# Patient Record
Sex: Female | Born: 1940 | Race: White | Hispanic: No | Smoking: Former smoker
Health system: Southern US, Community
[De-identification: ages and names within clinical notes are randomized; demographics above are authoritative.]

## PROBLEM LIST (undated history)

## (undated) DIAGNOSIS — K56609 Unspecified intestinal obstruction, unspecified as to partial versus complete obstruction: Secondary | ICD-10-CM

## (undated) DIAGNOSIS — I10 Essential (primary) hypertension: Secondary | ICD-10-CM

## (undated) DIAGNOSIS — K219 Gastro-esophageal reflux disease without esophagitis: Secondary | ICD-10-CM

## (undated) DIAGNOSIS — F419 Anxiety disorder, unspecified: Secondary | ICD-10-CM

## (undated) DIAGNOSIS — R092 Respiratory arrest: Secondary | ICD-10-CM

## (undated) DIAGNOSIS — G4730 Sleep apnea, unspecified: Secondary | ICD-10-CM

## (undated) DIAGNOSIS — F0390 Unspecified dementia without behavioral disturbance: Secondary | ICD-10-CM

## (undated) DIAGNOSIS — M1990 Unspecified osteoarthritis, unspecified site: Secondary | ICD-10-CM

## (undated) DIAGNOSIS — F0280 Dementia in other diseases classified elsewhere without behavioral disturbance: Secondary | ICD-10-CM

## (undated) DIAGNOSIS — F329 Major depressive disorder, single episode, unspecified: Secondary | ICD-10-CM

## (undated) DIAGNOSIS — E785 Hyperlipidemia, unspecified: Secondary | ICD-10-CM

## (undated) DIAGNOSIS — I2510 Atherosclerotic heart disease of native coronary artery without angina pectoris: Secondary | ICD-10-CM

## (undated) DIAGNOSIS — J449 Chronic obstructive pulmonary disease, unspecified: Secondary | ICD-10-CM

## (undated) DIAGNOSIS — I255 Ischemic cardiomyopathy: Secondary | ICD-10-CM

## (undated) DIAGNOSIS — I472 Ventricular tachycardia: Secondary | ICD-10-CM

## (undated) DIAGNOSIS — Z888 Allergy status to other drugs, medicaments and biological substances status: Secondary | ICD-10-CM

## (undated) DIAGNOSIS — E663 Overweight: Secondary | ICD-10-CM

## (undated) DIAGNOSIS — E119 Type 2 diabetes mellitus without complications: Secondary | ICD-10-CM

## (undated) DIAGNOSIS — E559 Vitamin D deficiency, unspecified: Secondary | ICD-10-CM

## (undated) DIAGNOSIS — M797 Fibromyalgia: Secondary | ICD-10-CM

## (undated) DIAGNOSIS — E669 Obesity, unspecified: Secondary | ICD-10-CM

## (undated) DIAGNOSIS — R413 Other amnesia: Secondary | ICD-10-CM

## (undated) DIAGNOSIS — K5790 Diverticulosis of intestine, part unspecified, without perforation or abscess without bleeding: Secondary | ICD-10-CM

## (undated) DIAGNOSIS — I5032 Chronic diastolic (congestive) heart failure: Secondary | ICD-10-CM

## (undated) DIAGNOSIS — N200 Calculus of kidney: Secondary | ICD-10-CM

## (undated) DIAGNOSIS — R1310 Dysphagia, unspecified: Secondary | ICD-10-CM

## (undated) HISTORY — DX: Unspecified osteoarthritis, unspecified site: M19.90

## (undated) HISTORY — DX: Chronic obstructive pulmonary disease, unspecified: J44.9

## (undated) HISTORY — DX: Ventricular tachycardia: I47.2

## (undated) HISTORY — DX: Vitamin D deficiency, unspecified: E55.9

## (undated) HISTORY — DX: Other amnesia: R41.3

## (undated) HISTORY — PX: OTHER SURGICAL HISTORY: SHX169

## (undated) HISTORY — DX: Essential (primary) hypertension: I10

## (undated) HISTORY — DX: Unspecified intestinal obstruction, unspecified as to partial versus complete obstruction: K56.609

## (undated) HISTORY — DX: Dysphagia, unspecified: R13.10

## (undated) HISTORY — DX: Gastro-esophageal reflux disease without esophagitis: K21.9

## (undated) HISTORY — PX: LITHOTRIPSY: SUR834

## (undated) HISTORY — DX: Unspecified dementia without behavioral disturbance: F03.90

## (undated) HISTORY — DX: Anxiety disorder, unspecified: F41.9

## (undated) HISTORY — DX: Ischemic cardiomyopathy: I25.5

## (undated) HISTORY — DX: Atherosclerotic heart disease of native coronary artery without angina pectoris: I25.10

## (undated) HISTORY — DX: Diverticulosis of intestine, part unspecified, without perforation or abscess without bleeding: K57.90

## (undated) HISTORY — DX: Sleep apnea, unspecified: G47.30

## (undated) HISTORY — PX: CARDIAC SURGERY: SHX584

## (undated) HISTORY — DX: Calculus of kidney: N20.0

## (undated) HISTORY — DX: Hyperlipidemia, unspecified: E78.5

## (undated) HISTORY — DX: Fibromyalgia: M79.7

## (undated) HISTORY — DX: Overweight: E66.3

## (undated) HISTORY — DX: Type 2 diabetes mellitus without complications: E11.9

## (undated) HISTORY — DX: Obesity, unspecified: E66.9

## (undated) HISTORY — DX: Allergy status to other drugs, medicaments and biological substances status: Z88.8

## (undated) HISTORY — DX: Major depressive disorder, single episode, unspecified: F32.9

## (undated) HISTORY — DX: Chronic diastolic (congestive) heart failure: I50.32

---

## 1998-10-18 ENCOUNTER — Ambulatory Visit (HOSPITAL_COMMUNITY): Admission: RE | Admit: 1998-10-18 | Discharge: 1998-10-18 | Payer: Self-pay | Admitting: Internal Medicine

## 1999-02-11 ENCOUNTER — Other Ambulatory Visit: Admission: RE | Admit: 1999-02-11 | Discharge: 1999-02-11 | Payer: Self-pay | Admitting: *Deleted

## 1999-07-01 ENCOUNTER — Encounter: Payer: Self-pay | Admitting: Emergency Medicine

## 1999-07-01 ENCOUNTER — Emergency Department (HOSPITAL_COMMUNITY): Admission: EM | Admit: 1999-07-01 | Discharge: 1999-07-01 | Payer: Self-pay | Admitting: Emergency Medicine

## 1999-07-05 ENCOUNTER — Encounter (INDEPENDENT_AMBULATORY_CARE_PROVIDER_SITE_OTHER): Payer: Self-pay | Admitting: Specialist

## 1999-07-05 ENCOUNTER — Other Ambulatory Visit: Admission: RE | Admit: 1999-07-05 | Discharge: 1999-07-05 | Payer: Self-pay | Admitting: *Deleted

## 1999-09-14 ENCOUNTER — Encounter: Admission: RE | Admit: 1999-09-14 | Discharge: 1999-09-14 | Payer: Self-pay | Admitting: *Deleted

## 2000-10-01 ENCOUNTER — Encounter: Admission: RE | Admit: 2000-10-01 | Discharge: 2000-10-01 | Payer: Self-pay | Admitting: Family Medicine

## 2000-10-01 ENCOUNTER — Encounter: Payer: Self-pay | Admitting: Family Medicine

## 2000-12-12 ENCOUNTER — Encounter: Payer: Self-pay | Admitting: Obstetrics and Gynecology

## 2000-12-12 ENCOUNTER — Encounter: Admission: RE | Admit: 2000-12-12 | Discharge: 2000-12-12 | Payer: Self-pay | Admitting: Obstetrics and Gynecology

## 2003-10-15 ENCOUNTER — Emergency Department (HOSPITAL_COMMUNITY): Admission: EM | Admit: 2003-10-15 | Discharge: 2003-10-16 | Payer: Self-pay | Admitting: Emergency Medicine

## 2004-03-10 ENCOUNTER — Other Ambulatory Visit: Admission: RE | Admit: 2004-03-10 | Discharge: 2004-03-10 | Payer: Self-pay | Admitting: Obstetrics and Gynecology

## 2004-11-15 ENCOUNTER — Inpatient Hospital Stay (HOSPITAL_COMMUNITY): Admission: EM | Admit: 2004-11-15 | Discharge: 2004-11-17 | Payer: Self-pay | Admitting: Emergency Medicine

## 2007-06-23 ENCOUNTER — Emergency Department (HOSPITAL_COMMUNITY): Admission: EM | Admit: 2007-06-23 | Discharge: 2007-06-23 | Payer: Self-pay | Admitting: *Deleted

## 2007-09-05 ENCOUNTER — Inpatient Hospital Stay (HOSPITAL_COMMUNITY): Admission: EM | Admit: 2007-09-05 | Discharge: 2007-09-08 | Payer: Self-pay | Admitting: Emergency Medicine

## 2007-09-27 ENCOUNTER — Ambulatory Visit: Payer: Self-pay | Admitting: Internal Medicine

## 2007-10-16 ENCOUNTER — Encounter: Payer: Self-pay | Admitting: Internal Medicine

## 2007-10-16 ENCOUNTER — Ambulatory Visit: Payer: Self-pay | Admitting: Internal Medicine

## 2007-12-08 ENCOUNTER — Emergency Department (HOSPITAL_COMMUNITY): Admission: EM | Admit: 2007-12-08 | Discharge: 2007-12-08 | Payer: Self-pay | Admitting: Emergency Medicine

## 2007-12-10 ENCOUNTER — Ambulatory Visit: Payer: Self-pay | Admitting: Pulmonary Disease

## 2007-12-10 ENCOUNTER — Inpatient Hospital Stay (HOSPITAL_COMMUNITY): Admission: EM | Admit: 2007-12-10 | Discharge: 2007-12-21 | Payer: Self-pay | Admitting: Emergency Medicine

## 2007-12-12 ENCOUNTER — Ambulatory Visit: Payer: Self-pay | Admitting: Internal Medicine

## 2007-12-14 ENCOUNTER — Encounter: Payer: Self-pay | Admitting: Internal Medicine

## 2007-12-27 ENCOUNTER — Telehealth: Payer: Self-pay | Admitting: Internal Medicine

## 2007-12-27 DIAGNOSIS — G4730 Sleep apnea, unspecified: Secondary | ICD-10-CM | POA: Insufficient documentation

## 2007-12-30 ENCOUNTER — Ambulatory Visit: Payer: Self-pay | Admitting: Internal Medicine

## 2007-12-30 DIAGNOSIS — R5383 Other fatigue: Secondary | ICD-10-CM

## 2007-12-30 DIAGNOSIS — J159 Unspecified bacterial pneumonia: Secondary | ICD-10-CM | POA: Insufficient documentation

## 2007-12-30 DIAGNOSIS — R5381 Other malaise: Secondary | ICD-10-CM | POA: Insufficient documentation

## 2007-12-30 DIAGNOSIS — Z87891 Personal history of nicotine dependence: Secondary | ICD-10-CM | POA: Insufficient documentation

## 2007-12-30 DIAGNOSIS — N178 Other acute kidney failure: Secondary | ICD-10-CM | POA: Insufficient documentation

## 2008-01-29 ENCOUNTER — Encounter: Payer: Self-pay | Admitting: Internal Medicine

## 2008-02-03 ENCOUNTER — Ambulatory Visit: Payer: Self-pay | Admitting: Internal Medicine

## 2008-02-24 ENCOUNTER — Telehealth: Payer: Self-pay | Admitting: Internal Medicine

## 2008-03-09 ENCOUNTER — Ambulatory Visit: Payer: Self-pay | Admitting: Internal Medicine

## 2008-04-20 DIAGNOSIS — M549 Dorsalgia, unspecified: Secondary | ICD-10-CM | POA: Insufficient documentation

## 2008-04-20 DIAGNOSIS — M797 Fibromyalgia: Secondary | ICD-10-CM | POA: Insufficient documentation

## 2008-04-20 DIAGNOSIS — D126 Benign neoplasm of colon, unspecified: Secondary | ICD-10-CM | POA: Insufficient documentation

## 2008-04-20 DIAGNOSIS — Z8719 Personal history of other diseases of the digestive system: Secondary | ICD-10-CM | POA: Insufficient documentation

## 2008-04-20 DIAGNOSIS — N39498 Other specified urinary incontinence: Secondary | ICD-10-CM | POA: Insufficient documentation

## 2008-04-20 DIAGNOSIS — H409 Unspecified glaucoma: Secondary | ICD-10-CM | POA: Insufficient documentation

## 2008-04-20 HISTORY — DX: Fibromyalgia: M79.7

## 2008-04-21 ENCOUNTER — Ambulatory Visit: Payer: Self-pay | Admitting: Internal Medicine

## 2008-04-26 ENCOUNTER — Ambulatory Visit (HOSPITAL_COMMUNITY): Admission: RE | Admit: 2008-04-26 | Discharge: 2008-04-26 | Payer: Self-pay | Admitting: Family Medicine

## 2008-06-25 ENCOUNTER — Ambulatory Visit: Payer: Self-pay | Admitting: Internal Medicine

## 2008-06-25 ENCOUNTER — Inpatient Hospital Stay (HOSPITAL_COMMUNITY): Admission: EM | Admit: 2008-06-25 | Discharge: 2008-06-30 | Payer: Self-pay | Admitting: Emergency Medicine

## 2008-06-26 ENCOUNTER — Encounter: Payer: Self-pay | Admitting: Internal Medicine

## 2008-07-02 ENCOUNTER — Telehealth (INDEPENDENT_AMBULATORY_CARE_PROVIDER_SITE_OTHER): Payer: Self-pay | Admitting: *Deleted

## 2008-07-15 ENCOUNTER — Ambulatory Visit (HOSPITAL_COMMUNITY): Admission: RE | Admit: 2008-07-15 | Discharge: 2008-07-15 | Payer: Self-pay | Admitting: Surgery

## 2008-07-17 ENCOUNTER — Encounter: Payer: Self-pay | Admitting: Internal Medicine

## 2008-07-22 ENCOUNTER — Ambulatory Visit: Payer: Self-pay | Admitting: Internal Medicine

## 2008-07-29 ENCOUNTER — Inpatient Hospital Stay (HOSPITAL_COMMUNITY): Admission: EM | Admit: 2008-07-29 | Discharge: 2008-08-03 | Payer: Self-pay | Admitting: Emergency Medicine

## 2008-07-29 ENCOUNTER — Ambulatory Visit: Payer: Self-pay | Admitting: Emergency Medicine

## 2008-07-31 ENCOUNTER — Encounter (INDEPENDENT_AMBULATORY_CARE_PROVIDER_SITE_OTHER): Payer: Self-pay | Admitting: Surgery

## 2008-07-31 HISTORY — PX: OTHER SURGICAL HISTORY: SHX169

## 2008-08-11 ENCOUNTER — Encounter: Payer: Self-pay | Admitting: Internal Medicine

## 2008-08-11 ENCOUNTER — Ambulatory Visit (HOSPITAL_COMMUNITY): Admission: RE | Admit: 2008-08-11 | Discharge: 2008-08-11 | Payer: Self-pay | Admitting: Surgery

## 2008-09-03 ENCOUNTER — Encounter: Payer: Self-pay | Admitting: Internal Medicine

## 2008-10-29 ENCOUNTER — Ambulatory Visit: Payer: Self-pay | Admitting: Internal Medicine

## 2008-10-29 DIAGNOSIS — R0602 Shortness of breath: Secondary | ICD-10-CM | POA: Insufficient documentation

## 2008-12-23 ENCOUNTER — Encounter: Admission: RE | Admit: 2008-12-23 | Discharge: 2008-12-23 | Payer: Self-pay | Admitting: Family Medicine

## 2009-01-18 ENCOUNTER — Ambulatory Visit: Payer: Self-pay | Admitting: Internal Medicine

## 2009-02-22 ENCOUNTER — Ambulatory Visit: Payer: Self-pay | Admitting: Internal Medicine

## 2009-02-22 DIAGNOSIS — J4489 Other specified chronic obstructive pulmonary disease: Secondary | ICD-10-CM | POA: Insufficient documentation

## 2009-02-22 DIAGNOSIS — J449 Chronic obstructive pulmonary disease, unspecified: Secondary | ICD-10-CM | POA: Insufficient documentation

## 2009-06-08 ENCOUNTER — Telehealth: Payer: Self-pay | Admitting: Internal Medicine

## 2009-09-14 ENCOUNTER — Ambulatory Visit: Payer: Self-pay | Admitting: Internal Medicine

## 2009-12-10 ENCOUNTER — Encounter: Admission: RE | Admit: 2009-12-10 | Discharge: 2009-12-10 | Payer: Self-pay | Admitting: Family Medicine

## 2009-12-28 ENCOUNTER — Observation Stay (HOSPITAL_COMMUNITY): Admission: AD | Admit: 2009-12-28 | Discharge: 2009-12-29 | Payer: Self-pay | Admitting: Orthopaedic Surgery

## 2010-04-05 ENCOUNTER — Ambulatory Visit: Payer: Self-pay | Admitting: Internal Medicine

## 2010-07-14 ENCOUNTER — Ambulatory Visit: Payer: Self-pay | Admitting: Pulmonary Disease

## 2010-07-14 ENCOUNTER — Telehealth (INDEPENDENT_AMBULATORY_CARE_PROVIDER_SITE_OTHER): Payer: Self-pay | Admitting: *Deleted

## 2010-07-15 ENCOUNTER — Telehealth (INDEPENDENT_AMBULATORY_CARE_PROVIDER_SITE_OTHER): Payer: Self-pay | Admitting: *Deleted

## 2010-07-15 ENCOUNTER — Telehealth: Payer: Self-pay | Admitting: Pulmonary Disease

## 2010-07-18 ENCOUNTER — Telehealth: Payer: Self-pay | Admitting: Pulmonary Disease

## 2010-07-22 ENCOUNTER — Telehealth (INDEPENDENT_AMBULATORY_CARE_PROVIDER_SITE_OTHER): Payer: Self-pay | Admitting: *Deleted

## 2010-08-02 ENCOUNTER — Ambulatory Visit: Payer: Self-pay | Admitting: Internal Medicine

## 2010-08-16 ENCOUNTER — Telehealth: Payer: Self-pay | Admitting: Internal Medicine

## 2010-12-11 ENCOUNTER — Encounter: Payer: Self-pay | Admitting: Family Medicine

## 2010-12-20 NOTE — Assessment & Plan Note (Signed)
Summary: acute sick visit for copd exacerbation   Primary Provider/Referring Provider:  Laurann Montana, M.D.  CC:  Acute Visit.  MR pt.  c/o increased SOB at rest and with activity x 1 wk and chest tightness off and on x 1 week.Marland Kitchen  History of Present Illness: The pt comes in today for an acute sick visit related to her known COPD.  She gives a 4day h/o worsening sob, chest tightness with wheezing, and cough but no congestion or mucus.  She denies any worsening LE edema, and has had no pleuritic or anginal like chest pain.  She denies any f/c/s.  Current Medications (verified): 1)  Metformin Hcl 500 Mg Tabs (Metformin Hcl) .... Take 1 Tablet By Mouth Two Times A Day 2)  Glipizide 5 Mg Tabs (Glipizide) .... Take 1 Tablet By Mouth Once A Day 3)  Lyrica 75 Mg  Caps (Pregabalin) .... Two Times A Day 4)  Zetia 10 Mg  Tabs (Ezetimibe) .... Once Daily 5)  Betimol 0.5 %  Soln (Timolol) .Marland Kitchen.. 1 Drop Right Eye Two Times A Day 6)  Travatan 0.004 %  Soln (Travoprost) .... One Drop Both Eyes At Bedtime 7)  Oxycodone-Acetaminophen 5-325 Mg  Tabs (Oxycodone-Acetaminophen) .... Two Times A Day As Needed 8)  Albuterol 90 Mcg/act  Aers (Albuterol) .... 2 Puffs Two Times A Day 9)  Aleve 220 Mg  Tabs (Naproxen Sodium) .... As Needed 10)  Detrol La 4 Mg  Cp24 (Tolterodine Tartrate) .... Once Daily 11)  Miralax   Powd (Polyethylene Glycol 3350) .... Once Daily 12)  Amlodipine Besylate 2.5 Mg Tabs (Amlodipine Besylate) .... Once Daily 13)  Advair Diskus 250-50 Mcg/dose Aepb (Fluticasone-Salmeterol) .... One Puff Twice Daily 14)  Cyclobenzaprine Hcl 10 Mg Tabs (Cyclobenzaprine Hcl) .... 1/2 To 1 Tablet 2-3 Times Daily As Needed Muscle Spasms 15)  Zanaflex 2 Mg Caps (Tizanidine Hcl) .Marland Kitchen.. 1-2 Every 6 Hours  Allergies (verified): 1)  ! Lisinopril 2)  ! Penicillin 3)  ! Erythromycin 4)  ! Darvon 5)  ! Talwin 6)  ! Accupril 7)  ! Sulfa  Past History:  Past medical, surgical, family and social histories  (including risk factors) reviewed, and no changes noted (except as noted below).  Past Medical History: Reviewed history from 04/05/2010 and no changes required. 1. Diabetes mellitus type 2.   2. Hypertension.   3. Hyperlipidemia.   4. Diverticulosis.   5. Asthma/COPD - [facility]ed at [facility] when she saw them for pna - mid 2000s.  -> 06/2008 Fev1 1.33L/59%  -> 01/2009 Fev1 1.05L/50%. TLC 93%, RV 153%, DLCO 53% -> restart advair. Refused spiriva   6. 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   7. Recent nephrolithiasis. dec 2008 and jan 2009 8. Sleep Apnea - non compliant with cpap due to smothering sensation. REfused sleep eval in oct 2010 9. Overweight - BMI 29 10. ICU hospitalization 12/12/2007 through 12/21/2007 for:  >. Ventilator dependent respiratory failure x2.  ACE inhibitor allergy suspected >. Hypertension status post cardiac arrest. - cpr on field by ems with ROSC.  >. Nephrolithiasis with a right sided hydronephrosis - sever pain at onset and and had stone removal  >. Abdominal pain.   >. Pneumonia - bilateral ll consolidation, cleard on CT 02/03/2008  >. Bilateral effusions.  > Acute Renal Failure and Acidosis.  Past Surgical History: Reviewed history from 10/29/2008 and no changes required. Bilateral tubal ligation.  Lithotripsy 9/11/12009: Lap sigmoid colectomy wiht repain of colovesical fistula  Past  Pulmonary History:  Pulmonary History: 02/18/2009:  Bronchoscopy--  Family History: Reviewed history from 04/21/2008 and no changes required. Family History of Breast Cancer: Maternal Grandmother Family History of Diabetes: Father, Sisters Family History of Heart Disease: Father No FH of Colon Cancer:  Social History: Reviewed history from 10/29/2008 and no changes required. Patient is a 70 year old female who takes care of her   own ADLs and has a very supportive family.  The patient has a strong   smoking history with over a pack a day for 40+ years  and  quit  10/2004. Denies any alcohol or illegal drugs.     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 (Stopped in 2005) Alcohol Use - no Illicit Drug Use - no Daily Caffeine Use  Review of Systems       The patient complains of shortness of breath with activity, shortness of breath at rest, productive cough, irregular heartbeats, headaches, sneezing, itching, anxiety, hand/feet swelling, and joint stiffness or pain.  The patient denies non-productive cough, coughing up blood, chest pain, acid heartburn, indigestion, loss of appetite, weight change, abdominal pain, difficulty swallowing, sore throat, tooth/dental problems, nasal congestion/difficulty breathing through nose, ear ache, depression, rash, change in color of mucus, and fever.    Vital Signs:  Patient profile:   70 year old female Height:      65 inches Weight:      187.13 pounds BMI:     31.25 O2 Sat:      95 % on Room air Temp:     97.6 degrees F oral Pulse rate:   75 / minute BP sitting:   152 / 86  (right arm) Cuff size:   regular  Vitals Entered By: Gweneth Dimitri RN (July 14, 2010 2:22 PM)  O2 Flow:  Room air CC: Acute Visit.  MR pt.  c/o increased SOB at rest and with activity x 1 wk and chest tightness off and on x 1 week. Comments Medications reviewed with patient Daytime contact number verified with patient. Gweneth Dimitri RN  July 14, 2010 2:22 PM    Physical Exam  General:  ow female in nad Nose:  no obvious drainage or purulence Lungs:  decreased bs, but no wheezing or rhonchi Heart:  rrr, no mrg Extremities:  mild ankle edema, no cyanosis  Neurologic:  alert and oriented, moves all 4.   Impression & Recommendations:  Problem # 1:  CHRONIC OBSTRUCTIVE PULMONARY DISEASE, ACUTE EXACERBATION (ICD-491.21)  the pt's history is most c/w a mild copd exacerbation.  There is nothing by exam or history to suggest a pulmonary infection.  Will treat her with a course of  prednisone, and see if she improves.  She is to call us if worsening symptoms.    Medications Added to Medication List This Visit: 1)  Albuterol 90 Mcg/act Aers (Albuterol) .... 2 puffs two times a day 2)  Zanaflex 2 Mg Caps (Tizanidine hcl) .Marland Kitchen.. 1-2 every 6 hours 3)  Prednisone 10 Mg Tabs (Prednisone) .... Take 4 each day for 2 days, then 3 each day for 2 days, then 2 each day for 2 days, then 1 each day for 2 days, then stop 4)  Albuterol Sulfate (2.5 Mg/80ml) 0.083% Nebu (Albuterol sulfate) .... Every 6 hours as needed for rescue only.  Other Orders: Est. Patient Level IV (87564)  Patient Instructions: 1)  will treat with a course of prednisone to see if it will help your breathing 2)  will write for albuterol  for your nebulizer to use as needed for rescue only, and no more than 4 times a day 3)  followup with Dr. [name] in 2 weeks.  Prescriptions: ALBUTEROL SULFATE (2.5 MG/3ML) 0.083%  NEBU (ALBUTEROL SULFATE) every 6 hours as needed for rescue only.  #one box x 3   Entered and Authorized by:   Barbaraann Share MD   Signed by:   Barbaraann Share MD on 07/14/2010   Method used:   Print then Give to Patient   RxID:   [id number] PREDNISONE 10 MG  TABS (PREDNISONE) take 4 each day for 2 days, then 3 each day for 2 days, then 2 each day for 2 days, then 1 each day for 2 days, then stop  #20 x 0   Entered and Authorized by:   Barbaraann Share MD   Signed by:   Barbaraann Share MD on 07/14/2010   Method used:   [facility]        [facility] [phone][facility] (retail)       [address]       [place], [place]  [address]       Ph: [phone] or [phone]       Fax: [phone]   RxID:   [id number]

## 2010-12-20 NOTE — Progress Notes (Signed)
Summary: prior auth  Phone Note Call from Patient Call back at [facility] Phone [phone]   Caller: Patient Call For: clance Summary of Call: pt was seen yesterday. says pharm told her that the albuterol neb requires prior auth. BCBS ID # [id number]. call pt at home or cell [phone] Initial call taken by: Tivis Ringer, CNA,  July 15, 2010 9:11 AM  Follow-up for Phone Call        [facility], spoke with Laquesta to see if this has been put thru under Medicare Part B.  She states no it hasn't but has to call the pt to get some additional info for Part B.  Once this is done, will run this med thru under Part B -- if not covered will call our office back.   Follow-up by: Gweneth Dimitri RN,  July 15, 2010 11:27 AM

## 2010-12-20 NOTE — Progress Notes (Signed)
Summary: breathing problem  Phone Note Call from Patient   Caller: Patient Call For: ramaswamy Summary of Call: breathing problem she have taken medication already and she is still not better. [facility] [address] Initial call taken by: Rickard Patience,  July 14, 2010 11:33 AM  Follow-up for Phone Call        called and spoke with pt.  pt hasn't seen MR since May 2011.  pt c/o increased sob that started yesterday.  pt states she is taking her advair as directed with no relief of symptoms.  Pt would like to be seen today.  Pt scheduled to see [facility] today at 2:30pm.  Arman Filter LPN  July 14, 2010 11:48 AM

## 2010-12-20 NOTE — Assessment & Plan Note (Signed)
Summary: rov 2 wks ///kp   Visit Type:  Follow-up Copy to:  hospital followup Primary Provider/Referring Provider:  Laurann Montana, M.D.  CC:  Pt here for 2 week follow-up. Pt saw [facility] for acute visit and was started on prednisone taper. .  History of Present Illness:  Followup  EX-SMOKER.  Gold stage 2-3 COPD with BD reversibility/Asthma compponent. Maintained on advair.  Refused spiriva, rehab , and flu shot  OV 08/02/2010: Last seen May 2011. Then on 07/14/2010 had AECOPD. Saw Clance. Placed on pred taper. that is now completed. Feels well. Albuterol use is down to two times a day as needed but coming down rapdily. COmpliant with advair. Dyspnea back to baseline. Walked mile today without problem. Denies cough, wheeze, edema, sputum, nausea, vomit, diarrhea.   Preventive Screening-Counseling & Management  Alcohol-Tobacco     Smoking Status: quit     Packs/Day: 1.0     Year Started: 1962     Year Quit: 2005     Pack years: 32     Tobacco Counseling: not to resume use of tobacco products  Current Medications (verified): 1)  Metformin Hcl 500 Mg Tabs (Metformin Hcl) .... Take 1 Tablet By Mouth Two Times A Day 2)  Glipizide 5 Mg Tabs (Glipizide) .... Take 1 Tablet By Mouth Once A Day 3)  Lyrica 75 Mg  Caps (Pregabalin) .... Two Times A Day 4)  Zetia 10 Mg  Tabs (Ezetimibe) .... Once Daily 5)  Betimol 0.5 %  Soln (Timolol) .Marland Kitchen.. 1 Drop Right Eye Two Times A Day 6)  Travatan 0.004 %  Soln (Travoprost) .... One Drop Both Eyes At Bedtime 7)  Oxycodone-Acetaminophen 5-325 Mg  Tabs (Oxycodone-Acetaminophen) .... Two Times A Day As Needed 8)  Albuterol 90 Mcg/act  Aers (Albuterol) .... 2 Puffs Two Times A Day 9)  Aleve 220 Mg  Tabs (Naproxen Sodium) .... As Needed 10)  Detrol La 4 Mg  Cp24 (Tolterodine Tartrate) .... Once Daily 11)  Miralax   Powd (Polyethylene Glycol 3350) .... Once Daily 12)  Amlodipine Besylate 2.5 Mg Tabs (Amlodipine Besylate) .... Once Daily 13)  Advair Diskus 250-50  Mcg/dose Aepb (Fluticasone-Salmeterol) .... One Puff Twice Daily 14)  Cyclobenzaprine Hcl 10 Mg Tabs (Cyclobenzaprine Hcl) .... 1/2 To 1 Tablet 2-3 Times Daily As Needed Muscle Spasms 15)  Zanaflex 2 Mg Caps (Tizanidine Hcl) .Marland Kitchen.. 1-2 Every 6 Hours 16)  Albuterol Sulfate (2.5 Mg/88ml) 0.083%  Nebu (Albuterol Sulfate) .... Every 6 Hours As Needed For Rescue Only.  Allergies (verified): 1)  ! Lisinopril 2)  ! Penicillin 3)  ! Erythromycin 4)  ! Darvon 5)  ! Talwin 6)  ! Accupril 7)  ! Sulfa 8)  ! Flu Vaccine  Past History:  Family History: Last updated: 04/21/2008 Family History of Breast Cancer: Maternal Grandmother Family History of Diabetes: Father, Sisters Family History of Heart Disease: Father No FH of Colon Cancer:  Social History: Last updated: 10/29/2008 Patient is a 70 year old female who takes care of her   own ADLs and has a very supportive family.  The patient has a strong   smoking history with over a pack a day for 40+ years and  quit  10/2004. Denies any alcohol or illegal drugs.     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 (Stopped in 2005) Alcohol Use - no Illicit Drug Use - no Daily Caffeine Use  Risk Factors: Smoking Status: quit (08/02/2010) Packs/Day: 1.0 (08/02/2010)  Past medical, surgical, family and social  histories (including risk factors) reviewed, and no changes noted (except as noted below).  Past Medical History: 1. Diabetes mellitus type 2.   2. Hypertension.   3. Hyperlipidemia.   4. Diverticulosis.   5. Asthma/COPD - asthma dxed at [facility] when she saw them for pna - mid 2000s.   - 06/2008 Fev1 1.33L/59%  - 01/2009 Fev1 1.05L/50%. TLC 93%, RV 153%, DLCO 53% -> restart advair. Refused spiriva   6. 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   7. Recent nephrolithiasis. dec 2008 and jan 2009 8. Sleep Apnea - non compliant with cpap due to smothering sensation. REfused sleep eval in oct 2010 9.  Overweight - BMI 29 10. ICU hospitalization 12/12/2007 through 12/21/2007 for:   - Ventilator dependent respiratory failure x2.  ACE inhibitor allergy suspected   -Hypertension status post cardiac arrest. - cpr on field by ems with ROSC.  . -Nephrolithiasis with a right sided hydronephrosis - sever pain at onset and and had stone removal  -. Pneumonia - bilateral ll consolidation, cleard on CT 02/03/2008  - Acute Renal Failure and Acidosis.  Past Surgical History: Reviewed history from 10/29/2008 and no changes required. Bilateral tubal ligation.  Lithotripsy 9/11/12009: Lap sigmoid colectomy wiht repain of colovesical fistula  Past Pulmonary History:  Pulmonary History: 02/18/2009:  Bronchoscopy--  Family History: Reviewed history from 04/21/2008 and no changes required. Family History of Breast Cancer: Maternal Grandmother Family History of Diabetes: Father, Sisters Family History of Heart Disease: Father No FH of Colon Cancer:  Social History: Reviewed history from 10/29/2008 and no changes required. Patient is a 70 year old female who takes care of her   own ADLs and has a very supportive family.  The patient has a strong   smoking history with over a pack a day for 40+ years and  quit  10/2004. Denies any alcohol or illegal drugs.     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 (Stopped in 2005) Alcohol Use - no Illicit Drug Use - no Daily Caffeine Use Packs/Day:  1.0 Pack years:  43  Review of Systems  The patient denies shortness of breath with activity, shortness of breath at rest, productive cough, non-productive cough, coughing up blood, chest pain, irregular heartbeats, acid heartburn, indigestion, loss of appetite, weight change, abdominal pain, difficulty swallowing, sore throat, tooth/dental problems, headaches, nasal congestion/difficulty breathing through nose, sneezing, itching, ear ache, anxiety, depression, hand/feet swelling, joint  stiffness or pain, rash, change in color of mucus, and fever.    Vital Signs:  Patient profile:   70 year old female Height:      65 inches Weight:      184.38 pounds BMI:     30.79 O2 Sat:      96 % on Room air Temp:     98.4 degrees F oral Pulse rate:   116 / minute BP sitting:   140 / 68  (left arm) Cuff size:   regular  Vitals Entered By: Carron Curie CMA (August 02, 2010 2:20 PM)  O2 Flow:  Room air CC: Pt here for 2 week follow-up. Pt saw KC for acute visit and was started on prednisone taper.  Comments Medications reviewed with patient Carron Curie CMA  August 02, 2010 2:22 PM Daytime phone number verified with patient.    Physical Exam  General:  ow female in nad Head:  normocephalic and atraumatic Eyes:  PERRLA/EOM intact; conjunctiva and sclera clear Ears:  TMs intact and clear with normal canals Nose:  no obvious drainage or purulence Mouth:  no deformity or lesionsMelampatti Class III and Melampatti Class IV.   Neck:  Supple; no masses or thyromegaly. Chest Wall:  no deformities noted Lungs:  decreased bs, but no wheezing or rhonchi Heart:  rrr, no mrg Abdomen:  bowel sounds positive; abdomen soft and non-tender without masses, or organomegaly Msk:  no deformity or scoliosis noted with normal posture Pulses:  pulses normal Extremities:  mild ankle edema, no cyanosis  Neurologic:  alert and oriented, moves all 4. Skin:  intact without lesions or rashes Cervical Nodes:  no significant adenopathy Psych:  alert and cooperative; normal mood and affect; normal attention span and concentration   Impression & Recommendations:  Problem # 1:  CHRONIC OBSTRUCTIVE PULMONARY DISEASE, MODERATE (ICD-496) Assessment Unchanged  #COPD wigth BD Asthma component. 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 STable disease. s/p recent AECOPD 07/14/2010 - now well  PLAN Continue advair I again  offered pulmonary rehab to her but she refused I offered flu for fall shot but she refused citing egg allergy Use albuterol as needed Check alpha 1 - she agreed ROV 9 months  Other Orders: Est. Patient Level II ([address]) Prescription Created Electronically [phone]) T- * Misc. Laboratory test [phone])  Patient Instructions: 1)  #COPD 2)  - glad you are better after recent attack 3)  - continue advair daily 4)  - check blood for alpha 1 gene - cause of copd 5)  #RETURN 6)  - 9 months or sooner if needed Prescriptions: ADVAIR DISKUS 250-50 MCG/DOSE AEPB (FLUTICASONE-SALMETEROL) one puff twice daily  #1 x 9   Entered and Authorized by:   Kalman Shan MD   Signed by:   Kalman Shan MD on 08/02/2010   Method used:   Electronically to        [facility] [id number]* (retail)       [address] [place], [place]  [address]       Ph: [phone]       Fax: [phone]   RxID:   [id number] ALBUTEROL 90 MCG/ACT  AERS (ALBUTEROL) 2 puffs two times a day  #1 x 6   Entered and Authorized by:   Kalman Shan MD   Signed by:   Kalman Shan MD on 08/02/2010   Method used:   Electronically to        [facility] [address] #[id number]* (retail)       [address] [place], [place]  [address]       Ph: [phone]       Fax: [phone]   RxID:   [id number]

## 2010-12-20 NOTE — Progress Notes (Signed)
Summary: refill issue  Phone Note From Pharmacy Call back at [phone]   [facility] [phone]* Call For: clance  Summary of Call: States that albuterol neb didn't go through with medicare part B, pls advise. Initial call taken by: Darletta Moll,  July 15, 2010 1:32 PM  Follow-up for Phone Call        if  this did not go through medicare part b we will have to do prior auth---called and spoke with the pharmacy and they are aware that prior auth will be done and we will call them back once approved. Randell Loop [facility]  July 15, 2010 2:32 PM

## 2010-12-20 NOTE — Assessment & Plan Note (Signed)
Summary: rov 6-8 months ///kp   Visit Type:  Follow-up Copy to:  hospital followup Primary Provider/Referring Provider:  Newman Pies, M.D.  CC:  Pt here for 7 month follow-up. 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 LAst visit in april 2010 and OCt 2010. She is only on advair (Refused spiriva).  Dyspnea is stable; hardly any. Uses albuterol very rarely only. Wants refulls.  No dysonea with exertion. Only mild dyspnea present only 'ince in  a while" which she thinks is related to changes in the 'atmosphere'. Denies exertional dyspnea now. Denies associated cough, wheeze, hemoptysis, chest pain. More bothering issue is chronic back pain. She is contemplating acupuncture, massage. Therefore she is not interested in pulmonary rehab at all.   Current Medications (verified): 1)  Metformin Hcl 500 Mg Tabs (Metformin Hcl) .... Take 1 Tablet By Mouth Two Times A Day 2)  Glipizide 5 Mg Tabs (Glipizide) .... Take 1 Tablet By Mouth Once A Day 3)  Lyrica 75 Mg  Caps (Pregabalin) .... Two Times A Day 4)  Zetia 10 Mg  Tabs (Ezetimibe) .... Once Daily 5)  Betimol 0.5 %  Soln (Timolol) .Marland Kitchen.. 1 Drop Right Eye Two Times A Day 6)  Travatan 0.004 %  Soln (Travoprost) .... One Drop Both Eyes At Bedtime 7)  Oxycodone-Acetaminophen 5-325 Mg  Tabs (Oxycodone-Acetaminophen) .... Two Times A Day As Needed 8)  Albuterol 90 Mcg/act  Aers (Albuterol) .... 2 Puffs As Needed 9)  Aleve 220 Mg  Tabs (Naproxen Sodium) .... As Needed 10)  Detrol La 4 Mg  Cp24 (Tolterodine Tartrate) .... Once Daily 11)  Miralax   Powd (Polyethylene Glycol 3350) .... Once Daily 12)  Amlodipine Besylate 2.5 Mg Tabs (Amlodipine Besylate) .... Once Daily 13)  Advair Diskus 250-50 Mcg/dose Aepb (Fluticasone-Salmeterol) .... One Puff Twice Daily 14)  Cyclobenzaprine Hcl 10 Mg Tabs (Cyclobenzaprine Hcl) .... 1/2 To 1 Tablet 2-3 Times Daily As  Needed Muscle Spasms  Allergies (verified): 1)  ! Lisinopril 2)  ! Penicillin 3)  ! Erythromycin 4)  ! Darvon 5)  ! Talwin 6)  ! Accupril 7)  ! Sulfa  Past History:  Past Medical History: 1. Diabetes mellitus type 2.   2. Hypertension.   3. Hyperlipidemia.   4. Diverticulosis.   5. Asthma/COPD - asthma dxed at [facility] when she saw them for pna - mid 2000s.  -> 06/2008 Fev1 1.33L/59%  -> 01/2009 Fev1 1.05L/50%. TLC 93%, RV 153%, DLCO 53% -> restart advair. Refused spiriva   6. 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   7. Recent nephrolithiasis. dec 2008 and jan 2009 8. Sleep Apnea - non compliant with cpap due to smothering sensation. REfused sleep eval in oct 2010 9. Overweight - BMI 29 10. ICU hospitalization 12/12/2007 through 12/21/2007 for:  >. Ventilator dependent respiratory failure x2.  ACE inhibitor allergy suspected >. Hypertension status post cardiac arrest. - cpr on field by ems with ROSC.  >. Nephrolithiasis with a right sided hydronephrosis - sever pain at onset and and had stone removal  >. Abdominal pain.   >. Pneumonia - bilateral ll consolidation, cleard on CT 02/03/2008  >. Bilateral effusions.  > Acute Renal Failure and Acidosis.  Past Surgical History: Reviewed history from 10/29/2008 and no changes required. Bilateral tubal ligation.  Lithotripsy 9/11/12009: Lap sigmoid colectomy wiht repain of colovesical fistula  Past Pulmonary History:  Pulmonary History:  02/18/2009:  Bronchoscopy--  Family History: Reviewed history from 04/21/2008 and no changes required. Family History of Breast Cancer: Maternal Grandmother Family History of Diabetes: Father, Sisters Family History of Heart Disease: Father No FH of Colon Cancer:  Social History: Reviewed history from 10/29/2008 and no changes required. Patient is a 70 year old female who takes care of her   own ADLs and has a very supportive family.  The patient has a strong   smoking history  with over a pack a day for 40+ years and  quit  10/2004. Denies any alcohol or illegal drugs.     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 (Stopped in 2005) Alcohol Use - no Illicit Drug Use - no Daily Caffeine Use  Review of Systems       The patient complains of shortness of breath with activity.  The patient denies shortness of breath at rest, productive cough, non-productive cough, coughing up blood, chest pain, irregular heartbeats, acid heartburn, indigestion, loss of appetite, weight change, abdominal pain, difficulty swallowing, sore throat, tooth/dental problems, headaches, nasal congestion/difficulty breathing through nose, sneezing, itching, ear ache, anxiety, depression, hand/feet swelling, joint stiffness or pain, rash, change in color of mucus, and fever.    Vital Signs:  Patient profile:   70 year old female Height:      65 inches Weight:      189 pounds O2 Sat:      93 % on Room air Temp:     97.9 degrees F oral Pulse rate:   104 / minute BP sitting:   124 / 74  (right arm) Cuff size:   regular  Vitals Entered By: Carron Curie CMA (Apr 05, 2010 2:32 PM)  O2 Flow:  Room air CC: Pt here for 7 month follow-up.  Comments Medications reviewed with patient Carron Curie CMA  Apr 05, 2010 2:32 PM Daytime phone number verified with patient.    Physical Exam  General:  Well developed, well nourished, no acute distress. Head:  normocephalic and atraumatic Eyes:  PERRLA/EOM intact; conjunctiva and sclera clear Ears:  TMs intact and clear with normal canals Nose:  no deformity, discharge, inflammation, or lesions Mouth:  no deformity or lesionsMelampatti Class III and Melampatti Class IV.   Neck:  Supple; no masses or thyromegaly. Chest Wall:  no deformities noted Lungs:  Clear throughout to auscultation. Heart:  Regular rate and rhythm; no murmurs, rubs,  or bruits. Abdomen:  bowel sounds positive; abdomen soft and non-tender  without masses, or organomegaly Msk:  no deformity or scoliosis noted with normal posture Pulses:  pulses normal Extremities:  No clubbing, cyanosis, edema or deformities noted. Neurologic:  CN II-XII grossly intact with normal reflexes, coordination, muscle strength and tone Skin:  intact without lesions or rashes Cervical Nodes:  no significant adenopathy Psych:  alert and cooperative; normal mood and affect; normal attention span and concentration   Impression & Recommendations:  Problem # 1:  CHRONIC OBSTRUCTIVE PULMONARY DISEASE, MODERATE (ICD-496) Assessment Unchanged  #COPD wigth BD Asthma component. Hx of spiriva refusal  Well controlled on advair. STable disease  PLAN Continue advair I again offered pulmonary rehab to her. AFter an extensive discussion of benefits she refused stating she is not interested in exercising I offered flu for fall shot but she refused citing egg allergy Use albuterol as needed ROV 9 months  Other Orders: Est. Patient Level II ([address]109)  Patient Instructions: 1)  continue your inhalers regularly 2)  reconsider your decision not to attend  rehab 3)  I think you should really go to rehab 4)  have flu shot in fall 5)  return in 9 months or sooner if problems Prescriptions: ADVAIR DISKUS 250-50 MCG/DOSE AEPB (FLUTICASONE-SALMETEROL) one puff twice daily  #1 x 9   Entered and Authorized by:   Kalman Shan MD   Signed by:   Kalman Shan MD on 04/05/2010   Method used:   Electronically to        [facility] [phone]* (retail)       [address]       [place], [place]  [address]       Ph: [phone] or [phone]       Fax: [phone]   RxID:   [id number] ALBUTEROL 90 MCG/ACT  AERS (ALBUTEROL) 2 puffs as needed  #1 x 6   Entered and Authorized by:   Kalman Shan MD   Signed by:   Kalman Shan MD on 04/05/2010   Method used:   Electronically to        [facility]. [phone]* (retail)       [address], [place]  [address]       Ph: [phone] or [phone]       Fax: [phone]   RxID:   [id number]    Immunization History:  Pneumovax Immunization History:    Pneumovax:  pneumovax (09/24/2009)

## 2010-12-20 NOTE — Progress Notes (Signed)
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

## 2010-12-20 NOTE — Progress Notes (Signed)
Summary: PA -Albuterol 0.083% neb solution  Phone Note Outgoing Call   Call placed by: Reynaldo Minium CMA,  July 18, 2010 4:26 PM Call placed to: [facility] at [phone] PA dept Summary of Call: PA started for Albuterol 0.083% neb solution.  Initial call taken by: Reynaldo Minium CMA,  July 18, 2010 4:26 PM  Follow-up for Phone Call        faxing forms for [facility] to fill out.Reynaldo Minium CMA  July 18, 2010 4:27 PM   Form given to [facility] for [facility] to fill out.Reynaldo Minium CMA  July 18, 2010 4:35 PM   apparently form was faxed on 07-20-10 by KW. will await approval letter. .sign Carron Curie CMA  July 22, 2010 4:36 PM

## 2010-12-20 NOTE — Progress Notes (Signed)
Summary: PA denieed- albuterol  Phone Note Other Incoming   Caller: sandra//[employer] Summary of Call: FYI: albuterol 0.08 3% neb solution not covered by part D, it is covered by part B, therefore it has been denied. Initial call taken by: Darletta Moll,  July 22, 2010 1:32 PM  Follow-up for Phone Call        I called [employer] at [phone] and asked abput the albuterol being denied. They informed me that is has to be filed with [facility] under part B. I LMTCB to advise the pt of the above. Carron Curie CMA  July 22, 2010 4:44 PM  [facility].  Just wanted to make sure pt was able to get her albuterol neb meds from pharmacy.  Aundra Millet Reynolds LPN  July 26, 2010 2:42 PM   pt returned call, Please call back. Follow-up by: Eugene Gavia,  July 26, 2010 2:52 PM  Additional Follow-up for Phone Call Additional follow up Details #1::        Spoke with pt and she states that she was finally able to get her albuterol nebs from [facility].  Nothing further needed. Additional Follow-up by: Vernie Murders,  July 26, 2010 3:00 PM

## 2010-12-28 ENCOUNTER — Inpatient Hospital Stay (HOSPITAL_COMMUNITY)
Admission: EM | Admit: 2010-12-28 | Discharge: 2010-12-31 | DRG: 247 | Disposition: A | Discharge status: Home / Self Care | Payer: Medicare Other | Source: Ambulatory Visit | Attending: Cardiovascular Disease | Admitting: Cardiovascular Disease

## 2010-12-28 ENCOUNTER — Emergency Department (HOSPITAL_COMMUNITY)
Admission: EM | Admit: 2010-12-28 | Discharge: 2010-12-28 | Disposition: A | Discharge status: Other Acute Inpatient Hospital | Payer: Medicare Other | Attending: Emergency Medicine | Admitting: Emergency Medicine

## 2010-12-28 ENCOUNTER — Emergency Department (HOSPITAL_COMMUNITY): Payer: Medicare Other

## 2010-12-28 DIAGNOSIS — J4489 Other specified chronic obstructive pulmonary disease: Secondary | ICD-10-CM | POA: Diagnosis present

## 2010-12-28 DIAGNOSIS — I2510 Atherosclerotic heart disease of native coronary artery without angina pectoris: Secondary | ICD-10-CM

## 2010-12-28 DIAGNOSIS — N390 Urinary tract infection, site not specified: Secondary | ICD-10-CM | POA: Diagnosis present

## 2010-12-28 DIAGNOSIS — F411 Generalized anxiety disorder: Secondary | ICD-10-CM | POA: Diagnosis present

## 2010-12-28 DIAGNOSIS — R791 Abnormal coagulation profile: Secondary | ICD-10-CM | POA: Diagnosis present

## 2010-12-28 DIAGNOSIS — I10 Essential (primary) hypertension: Secondary | ICD-10-CM | POA: Diagnosis present

## 2010-12-28 DIAGNOSIS — I2589 Other forms of chronic ischemic heart disease: Secondary | ICD-10-CM | POA: Diagnosis present

## 2010-12-28 DIAGNOSIS — H409 Unspecified glaucoma: Secondary | ICD-10-CM | POA: Diagnosis present

## 2010-12-28 DIAGNOSIS — I219 Acute myocardial infarction, unspecified: Secondary | ICD-10-CM | POA: Insufficient documentation

## 2010-12-28 DIAGNOSIS — J449 Chronic obstructive pulmonary disease, unspecified: Secondary | ICD-10-CM | POA: Diagnosis present

## 2010-12-28 DIAGNOSIS — E785 Hyperlipidemia, unspecified: Secondary | ICD-10-CM | POA: Diagnosis present

## 2010-12-28 DIAGNOSIS — R079 Chest pain, unspecified: Secondary | ICD-10-CM | POA: Insufficient documentation

## 2010-12-28 DIAGNOSIS — E119 Type 2 diabetes mellitus without complications: Secondary | ICD-10-CM | POA: Diagnosis present

## 2010-12-28 DIAGNOSIS — I2119 ST elevation (STEMI) myocardial infarction involving other coronary artery of inferior wall: Principal | ICD-10-CM | POA: Diagnosis present

## 2010-12-28 DIAGNOSIS — G4733 Obstructive sleep apnea (adult) (pediatric): Secondary | ICD-10-CM | POA: Diagnosis present

## 2010-12-28 LAB — LIPID PANEL
Cholesterol: 133 mg/dL (ref 0–200)
HDL: 25 mg/dL — ABNORMAL LOW (ref >39)
LDL Cholesterol: 54 mg/dL (ref 0–99)
Triglycerides: 270 mg/dL — ABNORMAL HIGH (ref <150)

## 2010-12-28 LAB — COMPREHENSIVE METABOLIC PANEL
BUN: 17 mg/dL (ref 6–23)
CO2: 23 mEq/L (ref 19–32)
Calcium: 8.8 mg/dL (ref 8.4–10.5)
Chloride: 103 mEq/L (ref 96–112)
Creatinine, Ser: 0.91 mg/dL (ref 0.4–1.2)
GFR calc non Af Amer: >60 mL/min (ref >60)
Total Bilirubin: 0.5 mg/dL (ref 0.3–1.2)

## 2010-12-28 LAB — DIFFERENTIAL
Basophils Absolute: 0.1 10*3/uL (ref 0.0–0.1)
Eosinophils Relative: 2 % (ref 0–5)
Lymphocytes Relative: 37 % (ref 12–46)
Lymphs Abs: 3.8 10*3/uL (ref 0.7–4.0)
Monocytes Absolute: 0.4 10*3/uL (ref 0.1–1.0)
Monocytes Relative: 4 % (ref 3–12)
Neutro Abs: 5.7 10*3/uL (ref 1.7–7.7)

## 2010-12-28 LAB — URINALYSIS, ROUTINE W REFLEX MICROSCOPIC
Ketones, ur: NEGATIVE mg/dL
Protein, ur: NEGATIVE mg/dL
Urine Glucose, Fasting: NEGATIVE mg/dL

## 2010-12-28 LAB — BASIC METABOLIC PANEL
BUN: 15 mg/dL (ref 6–23)
CO2: 24 mEq/L (ref 19–32)
Calcium: 9.5 mg/dL (ref 8.4–10.5)
GFR calc Af Amer: >60 mL/min (ref >60)
GFR calc Af Amer: >60 mL/min (ref >60)
GFR calc non Af Amer: 54 mL/min — ABNORMAL LOW (ref >60)
GFR calc non Af Amer: >60 mL/min (ref >60)
Potassium: 4.4 mEq/L (ref 3.5–5.1)
Potassium: 4.1 mEq/L (ref 3.5–5.1)
Sodium: 137 mEq/L (ref 135–145)
Sodium: 138 mEq/L (ref 135–145)

## 2010-12-28 LAB — URINE MICROSCOPIC-ADD ON

## 2010-12-28 LAB — CBC
HCT: 42.1 % (ref 36.0–46.0)
Hemoglobin: 14.3 g/dL (ref 12.0–15.0)
Hemoglobin: 12.3 g/dL (ref 12.0–15.0)
MCH: 32.1 pg (ref 26.0–34.0)
MCHC: 34 g/dL (ref 30.0–36.0)
MCHC: 33.4 g/dL (ref 30.0–36.0)
MCV: 96.1 fL (ref 78.0–100.0)
MCV: 96.1 fL (ref 78.0–100.0)
RBC: 3.83 MIL/uL — ABNORMAL LOW (ref 3.87–5.11)

## 2010-12-28 LAB — GLUCOSE, CAPILLARY
Glucose-Capillary: 211 mg/dL — ABNORMAL HIGH (ref 70–99)
Glucose-Capillary: 233 mg/dL — ABNORMAL HIGH (ref 70–99)
Glucose-Capillary: 190 mg/dL — ABNORMAL HIGH (ref 70–99)

## 2010-12-28 LAB — PROTIME-INR
INR: 1.1 (ref 0.00–1.49)
Prothrombin Time: 39.8 seconds — ABNORMAL HIGH (ref 11.6–15.2)

## 2010-12-28 LAB — CARDIAC PANEL(CRET KIN+CKTOT+MB+TROPI)
CK, MB: 106.9 ng/mL (ref 0.3–4.0)
Relative Index: 17.9 — ABNORMAL HIGH (ref 0.0–2.5)
Relative Index: 15.4 — ABNORMAL HIGH (ref 0.0–2.5)
Total CK: 950 U/L — ABNORMAL HIGH (ref 7–177)
Total CK: 696 U/L — ABNORMAL HIGH (ref 7–177)
Troponin I: 39.16 ng/mL (ref 0.00–0.06)

## 2010-12-28 LAB — TROPONIN I: Troponin I: 0.02 ng/mL (ref 0.00–0.06)

## 2010-12-28 LAB — CK TOTAL AND CKMB (NOT AT ARMC)
CK, MB: 1.2 ng/mL (ref 0.3–4.0)
Total CK: 34 U/L (ref 7–177)

## 2010-12-28 LAB — POCT CARDIAC MARKERS: Myoglobin, poc: 57.3 ng/mL (ref 12–200)

## 2010-12-28 LAB — APTT: aPTT: 35 seconds (ref 24–37)

## 2010-12-29 LAB — CBC
Platelets: 199 10*3/uL (ref 150–400)
RBC: 3.77 MIL/uL — ABNORMAL LOW (ref 3.87–5.11)
WBC: 10.4 10*3/uL (ref 4.0–10.5)

## 2010-12-29 LAB — BASIC METABOLIC PANEL
Chloride: 104 mEq/L (ref 96–112)
GFR calc Af Amer: >60 mL/min (ref >60)
Potassium: 3.7 mEq/L (ref 3.5–5.1)

## 2010-12-29 LAB — PROTIME-INR
INR: 1.06 (ref 0.00–1.49)
Prothrombin Time: 14 seconds (ref 11.6–15.2)

## 2010-12-29 LAB — GLUCOSE, CAPILLARY
Glucose-Capillary: 192 mg/dL — ABNORMAL HIGH (ref 70–99)
Glucose-Capillary: 179 mg/dL — ABNORMAL HIGH (ref 70–99)
Glucose-Capillary: 209 mg/dL — ABNORMAL HIGH (ref 70–99)

## 2010-12-30 LAB — PLATELET INHIBITION P2Y12
P2Y12 % Inhibition: 0 %
Platelet Function  P2Y12: 270 [PRU] (ref 194–418)
Platelet Function Baseline: 252 [PRU] (ref 194–418)

## 2010-12-30 LAB — CBC
Hemoglobin: 10.8 g/dL — ABNORMAL LOW (ref 12.0–15.0)
MCHC: 32.2 g/dL (ref 30.0–36.0)
RDW: 12.7 % (ref 11.5–15.5)

## 2010-12-30 LAB — BASIC METABOLIC PANEL
CO2: 28 mEq/L (ref 19–32)
Calcium: 8 mg/dL — ABNORMAL LOW (ref 8.4–10.5)
GFR calc Af Amer: >60 mL/min (ref >60)
GFR calc non Af Amer: >60 mL/min (ref >60)
Sodium: 137 mEq/L (ref 135–145)

## 2010-12-30 LAB — URINE CULTURE: Colony Count: >=100000

## 2010-12-31 DIAGNOSIS — I2119 ST elevation (STEMI) myocardial infarction involving other coronary artery of inferior wall: Secondary | ICD-10-CM

## 2010-12-31 LAB — BASIC METABOLIC PANEL
BUN: 15 mg/dL (ref 6–23)
Chloride: 102 mEq/L (ref 96–112)
Glucose, Bld: 194 mg/dL — ABNORMAL HIGH (ref 70–99)
Potassium: 3.9 mEq/L (ref 3.5–5.1)

## 2010-12-31 LAB — CBC
HCT: 32 % — ABNORMAL LOW (ref 36.0–46.0)
Hemoglobin: 10.5 g/dL — ABNORMAL LOW (ref 12.0–15.0)
MCV: 96.1 fL (ref 78.0–100.0)
RBC: 3.33 MIL/uL — ABNORMAL LOW (ref 3.87–5.11)
WBC: 12.8 10*3/uL — ABNORMAL HIGH (ref 4.0–10.5)

## 2011-01-01 NOTE — Procedures (Signed)
[name], [name]               ACCOUNT NO.:  [id number]  MEDICAL RECORD NO.:  [id number]           PATIENT TYPE:  I  LOCATION:  2901                         FACILITY:  [facility]  PHYSICIAN:  Verne Carrow, MDDATE OF BIRTH:  12-Feb-1941  DATE OF PROCEDURE:  12/29/2010 DATE OF DISCHARGE:                           CARDIAC CATHETERIZATION   PROCEDURES PERFORMED: 1. Percutaneous transluminal coronary angioplasty with placement of     two overlapping drug-eluting stents in the mid right coronary     artery. 2. Rotablator atherectomy of the mid right coronary artery. 3. Placement of a temporary pacemaker.  OPERATOR:  Verne Carrow, MD  INDICATIONS:  This is a 70 year old Caucasian female who presented as an inferior ST-segment elevation myocardial infarction in the early morning hours of December 28, 2010.  The patient was found to have a subtotally occluded right coronary artery.  I performed balloon angioplasty of the mid right coronary artery.  However, secondary to heavy calcification, we elected not to place stents at that time.  Flow was reestablished into the vessel during the initial procedure.  We states the procedure and plan to bringing her back today for Rotablator atherectomy of heavily calcified mid-coronary stenosis.  DETAILS OF PROCEDURE:  The patient was brought back to the Main Cardiac Catheterization Laboratory after signing informed consent for the procedure.  The left groin was prepped and draped in the sterile fashion.  Lidocaine 1% was used for local anesthesia.  A 6-French sheath was inserted into the left femoral artery without difficulty.  A 6- French sheath was inserted into the left femoral vein without difficulty.  A temporary pacemaker was inserted through the sheath in the left femoral vein and advanced into the right ventricle.  A 6-French JR-4 guiding catheter was used to selectively engage the native right coronary artery.  The patient  was given a bolus of Angiomax and a drip was started.  Once the ACT was greater 200, we passed a floppy RotaWire down into the distal right coronary artery.  A 1.75-mm Rotablator burr was then passed over the wire and two passes were made throughout the heavily calcified lesion in the mid right coronary artery.  There were no complications.  The patient did require the temporary pacemaker several times during the procedure.  At this point, a 3.0 x 10-mm cutting balloon was positioned in the severe stenosis in the midvessel and was inflated without difficulty.  The balloon was advanced slightly further downstream and the midvessel was inflated again.  A 3.5 x 28-mm Promus Element drug-eluting stent was carefully positioned in the distal portion and the midvessel was deployed.  A 3.5 x 24-mm Promus Element drug-eluting stent was carefully positioned in the midvessel with a short segment of overlap with the first stent.  This was deployed without difficulty.  A 3.75 x 20-mm noncompliant balloon was then inflated three times inside the stented segment.  The stenosis was taken from initially at 99% down to 0%.  There was excellent flow into the distal vessel.  Of note, there was a very small filling defect that was present during the initial catheterization 30 hours ago  and was still present today in the distal portion of the posterior descending artery. This appeared to be a small thrombus that did not resolve.  After intervention was completed, this thrombus had shifted downstream into a very small-caliber distal posterolateral vessel.  I do not feel that it would be worthwhile to do downstream to open this various small caliber branch vessel.  The patient did well.  She had no complaints of chest pain.  She was taken to the holding area in stable condition.  IMPRESSION:  Successful percutaneous transluminal coronary angioplasty with Rotablator atherectomy of the mid right coronary artery  with placement of two overlapping drug-eluting stents.  The patient will be taken to the CCU.  We will continue her on aspirin, Plavix, beta- blocker, and statin.     Verne Carrow, MD     CM/MEDQ  D:  12/29/2010  T:  12/30/2010  Job:  [id number]  Electronically Signed by Verne Carrow MD on 12/30/2010 12:53:55 PM

## 2011-01-01 NOTE — Procedures (Signed)
[name], [name]               ACCOUNT NO.:  [id number]  MEDICAL RECORD NO.:  [id number]           PATIENT TYPE:  I  LOCATION:  2901                         FACILITY:  [facility]  PHYSICIAN:  Verne Carrow, MDDATE OF BIRTH:  1941/01/11  DATE OF PROCEDURE:  12/28/2010 DATE OF DISCHARGE:                           CARDIAC CATHETERIZATION   PROCEDURE PERFORMED: 1. Left heart catheterization. 2. Selective coronary angiography. 3. Left ventricular angiogram. 4. PTCA with balloon angioplasty of the mid and distal right coronary     artery.  OPERATOR:  Verne Carrow, MD.  INDICATIONS:  This is a 70 year old Caucasian female with a history of diabetes mellitus, hypertension and hyperlipidemia who presented to the emergency department with complaints of chest pain for approximately 90 minutes.  The onset of her pain was approximately 11:30 p.m.  Her EKG showed inferior ST-segment elevation myocardial infarction.  A code STEMI was called at approximately 1:00 a.m.  There was a significant delay in transport time from [facility] to [facility].  The patient was then brought straight to the CCU instead of to the cath lab as requested.  All this resulted in a significant delay in the patient's presentation until the time she arrived in the cath lab.  The patient was complaining of just mild chest discomfort when she arrived in the cath lab at [facility].  PROCEDURE IN DETAIL:  The patient was brought to the cardiac catheterization laboratory on an emergent basis.  Informed consent was implied.  The patient was placed supine on the cath table.  The right groin was prepped and draped in sterile fashion; 1% lidocaine was used for local anesthesia.  The patient was given sedation.  A 6-French sheath was inserted into the right femoral artery without difficulty.  A JL-4 catheter was used to selectively engage and inject the left coronary system.  A  6-French JR-4 guiding catheter was used to selectively engage and inject the native right coronary artery.  The patient was found to have subtotal occlusion of the distal right coronary artery and a severe calcified stenosis in the mid right coronary artery.  We elected to proceed to emergent intervention of the right coronary artery.  The patient was given a bolus of Angiomax and a drip was started.  She was given 600 mg of Plavix on the table.  We elected to use Plavix in this situation as the patient's initial INR was 4.0 in the emergency department, and we did not know if this was a lab error.  We felt that this did increase her bleeding risk if we used Efient.  When the ACT was greater than 200, we passed a Cougar intracoronary wire down the length of the right coronary artery into the distal vessel.  A 2.5 x 15-mm balloon was used to dilate the mid and distal lesion.  The mid lesion was heavily calcified and did not dilate with balloon inflation.  We then passed a 2.75 x 15-mm noncompliant balloon into the mid lesion and inflated this; however, the lesion would not respond to balloon inflation.  This was secondary to the heavy calcification  of the vessel.  We then attempted to pass a 3.0 x 10-mm cutting balloon across the lesion; however, the balloon would not pass across the lesion.  We attempted to pass a 2.5 x 10-mm cutting balloon across the lesion; however, it would not pass.  A 3.0 x 15-mm noncompliant balloon was positioned in the mid vessel and was inflated to high atmospheres; however, the balloon was not fully expanded.  The balloon would not expand secondary to the heavy calcification of the vessel.  There was very good flow down the vessel at this time.  There was a very distal segment of the posterior descending artery that seemed to be occluded, likely from a distal embolization.  There was also likely thrombus present in the distal vessel.  At this time, I felt  the best treatment plan would be to start the patient on an Integrilin drip and allow the medication to work on the thrombus that was present.  My plan would be to bring her back in several days to perform a Rotablator of the mid right coronary artery and then perform stenting of the mid and distal right coronary artery.  The patient had no complaints of chest pain at the conclusion of the case.  A left ventricular angiogram was performed.  The patient was taken to the CCU in stable condition.  HEMODYNAMIC FINDINGS:  Central aortic pressure 135/73.  Left ventricular pressure 130/5.  Left ventricular end-diastolic pressure 13.  ANGIOGRAPHIC FINDINGS: 1. The left main coronary artery had no evidence of disease. 2. Left anterior descending was a large vessel that coursed to the     apex and had 40% mid stenosis. 3. The circumflex artery had mild plaque disease. 4. The right coronary artery was a large dominant vessel with a     heavily calcified 99% mid stenosis.  The mid to distal vessel had a     99% subtotal occlusion.  The posterior descending artery was     occluded distally, likely from thrombus. 5. Left ventricular angiogram was performed in the RAO projection,     showed inferior wall hypokinesis with ejection fraction of 45%.  IMPRESSION: 1. Double-vessel coronary artery disease. 2. Left ventricular systolic dysfunction. 3. Inferior ST-segment elevation myocardial infarction. 4. Percutaneous coronary intervention with balloon angioplasty of the     mid and distal right coronary artery lesion with re-establishment     of flow into the vessel.  RECOMMENDATIONS:  As stated above, no stents were placed in the mid or distal right coronary artery at this time secondary to heavy calcification of the vessel and inability to completely expand the balloon because of the calcification.  The patient will be treated medically tonight with aspirin, Plavix, beta-blocker, statin and  an Integrilin drip for the next 18 hours.  I will plan on bringing her back to the cath lab in 48 hours for a Rotablator atherectomy of the calcified mid right coronary artery.  The plan would be after this to perform placement of stents in the mid and distal right coronary artery. The patient will be watched closely in the CCU.     Verne Carrow, MD     CM/MEDQ  D:  12/28/2010  T:  12/28/2010  Job:  [id number]  Electronically Signed by Verne Carrow MD on 12/30/2010 12:53:53 PM

## 2011-01-03 NOTE — H&P (Signed)
[name], [name]               ACCOUNT NO.:  [id number]  MEDICAL RECORD NO.:  [id number]           PATIENT TYPE:  I  LOCATION:  2901                         FACILITY:  [facility]  PHYSICIAN:  Armanda Magic, M.D.     DATE OF BIRTH:  October 25, 1941  DATE OF ADMISSION:  12/28/2010 DATE OF DISCHARGE:                             HISTORY & PHYSICAL   REFERRING PHYSICIAN:  [facility].  CHIEF COMPLAINT:  Chest pain.  HISTORY OF PLEASANT ILLNESS:  This is a 70 year old white female with a history of diabetes mellitus, hypertension, and dyslipidemia as well as tobacco abuse who had two episodes of chest pain in the past 6 months. She said today that she had an episode of severe chest pain with radiation to her jaw and arms bilaterally and presented to [facility] Emergency Room.  EKG there showed inferior ST elevation and she is now transferred to [facility] Cath Lab for emergent cardiac catheterization.  PAST MEDICAL HISTORY: 1. Diabetes. 2. Diverticulosis. 3. Hypertension. 4. History of small bowel obstruction in the past. 5. Obstructive sleep apnea. 6. Nephrolithiasis. 7. Glaucoma. 8. GERD. 9. Anxiety. 10.Arthritis. 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 12.The patient says she has vitamin K deficiency and COPD.  PAST SURGICAL HISTORY:  Status post lithotripsy and tubal ligation.  ALLERGIES:  LISINOPRIL, PENICILLIN, ERYTHROMYCIN, DARVON, SOLVIN, ACCUPRIL, SULFA, and FLU VACCINE.  FAMILY HISTORY:  Significant for diabetes, hypertension, and CAD.  SOCIAL HISTORY:  She denies any tobacco or alcohol use at present.  She used to smoke but quit in 2005.  She had a 43-pack year history before that.  She is retired and married.  MEDICATIONS:  Medications are incomplete, but her last office note from her pulmonologist include glipizide 5 mg daily, Lyrica 75 mg daily, Zetia 10 mg a day, Betimol eyedrops, Travatan eye drops, oxycodone, albuterol, Aleve, Detrol LA 4  mg daily, amlodipine 2.5 mg daily, Advair Diskus, and albuterol.  REVIEW OF SYSTEMS:  Other than what is stated in HPI is negative.  PHYSICAL EXAMINATION:  VITAL SIGNS:  Blood pressure is 134/74, heart rate 115. GENERAL:  He is a well-developed, well-nourished white female in acute distress. HEENT:  Benign. NECK:  Supple without lymphadenopathy.  Carotid upstrokes are +2 bilaterally.  No bruits. LUNGS:  Clear to auscultation throughout. HEART:  Regular rate and rhythm.  No murmurs, rubs, or gallops.  Normal S1 and S2. ABDOMEN:  Soft, nontender, nondistended.  Normoactive bowel sounds.  No hepatosplenomegaly. EXTREMITIES:  No cyanosis, erythema, or edema.  LABORATORY DATA:  Sodium 137, potassium 4.4, chloride 103, bicarb 24, BUN 21, creatinine 1.  White cell count 10.1, hemoglobin 14.3, hematocrit 42.8, platelet count 229.  MB less than 1, troponin less than 0.05, INR 4.12.  Chest x-ray shows CAD, blunted costophrenic angle.  EKG shows sinus rhythm with 1-mm ST elevation anteriorly.  ASSESSMENT: 1. Acute inferior ST elevation myocardial infarction. 2. Diabetes mellitus. 3. Chronic obstructive pulmonary disease. 4. Dyslipidemia. 5. Elevated INR.  PLAN:  We will plan emergent cath.  Recheck INR.  Start Lopressor 12.5 mg b.i.d. as blood pressure tolerates and aspirin.  Further medications  per Dr. Clifton James after he finishes the angioplasty.     Armanda Magic, M.D.     TT/MEDQ  D:  12/28/2010  T:  12/28/2010  Job:  [id number]  cc:   Verne Carrow, MD  Electronically Signed by Armanda Magic M.D. on 01/03/2011 12:09:00 PM

## 2011-01-05 ENCOUNTER — Encounter: Payer: Self-pay | Admitting: Cardiovascular Disease

## 2011-01-12 ENCOUNTER — Encounter: Payer: Self-pay | Admitting: Cardiovascular Disease

## 2011-01-12 ENCOUNTER — Encounter (INDEPENDENT_AMBULATORY_CARE_PROVIDER_SITE_OTHER): Payer: Medicare Other | Admitting: Cardiovascular Disease

## 2011-01-12 DIAGNOSIS — I2510 Atherosclerotic heart disease of native coronary artery without angina pectoris: Secondary | ICD-10-CM | POA: Insufficient documentation

## 2011-01-12 DIAGNOSIS — I2119 ST elevation (STEMI) myocardial infarction involving other coronary artery of inferior wall: Secondary | ICD-10-CM | POA: Insufficient documentation

## 2011-01-17 NOTE — Assessment & Plan Note (Addendum)
Summary: eph per night message/lg  Medications Added OXYCODONE-ACETAMINOPHEN 5-325 MG TABS (OXYCODONE-ACETAMINOPHEN) as directed and as needed      Allergies Added:   Visit Type:  eph Referring Provider:  hospital followup Primary Provider:  Laurann Montana, M.D.  CC:  sob. tired. pt has no other complaints today.[name]  History of Present Illness: 70 yo WF with history of DM, HTN, Hyperlipidemia, tobacco abuse and CAD recently admitted to [facility] on 12/28/10 with an acute inferior STEMI. Emergent cath on 12/29/10 with serial lesions in subtotally occluded RCA. I was unable to stent the vessel during the index procedure because of severe calcification in the mid vessel. I established flow with balloon inflations and treated her with Integrilin,  Effient and ASA and staged the PCI for 12/31/10 at which time a rotablator atherectomy of the mid RCA was performed. She did well with both procedures. Two overlapping drug eluting stents were placed in the mid RCA. EF was found to be 45 % at the time of the cath. There was mild disease in the LAD and the Circumflex arteries.   She is here today for hospital follow up. She has done well since discharge. She describes continued fatigue and SOB. No chest pain. Overall feels well.  Current Medications (verified): 1)  Metformin Hcl 500 Mg Tabs (Metformin Hcl) .... Take 1 Tablet By Mouth Two Times A Day 2)  Glipizide 5 Mg Tabs (Glipizide) .... Take 1 Tablet By Mouth Once A Day 3)  Lyrica 75 Mg  Caps (Pregabalin) .... Two Times A Day 4)  Zetia 10 Mg  Tabs (Ezetimibe) .... Once Daily 5)  Betimol 0.5 %  Soln (Timolol) .[name].. 1 Drop Right Eye Two Times A Day 6)  Travatan 0.004 %  Soln (Travoprost) .... One Drop Both Eyes At Bedtime 7)  Oxycodone-Acetaminophen 5-325 Mg  Tabs (Oxycodone-Acetaminophen) .... Two Times A Day As Needed 8)  Albuterol 90 Mcg/act  Aers (Albuterol) .... 2 Puffs Two Times A Day 9)  Detrol La 4 Mg  Cp24 (Tolterodine Tartrate) ....  Once Daily 10)  Miralax   Powd (Polyethylene Glycol 3350) .... Once Daily 11)  Amlodipine Besylate 2.5 Mg Tabs (Amlodipine Besylate) .... Once Daily 12)  Advair Diskus 250-50 Mcg/dose Aepb (Fluticasone-Salmeterol) .... One Puff Twice Daily 13)  Cyclobenzaprine Hcl 10 Mg Tabs (Cyclobenzaprine Hcl) .... 1/2 To 1 Tablet 2-3 Times Daily As Needed Muscle Spasms 14)  Zanaflex 2 Mg Caps (Tizanidine Hcl) .[name].. 1-2 Every 6 Hours 15)  Albuterol Sulfate (2.5 Mg/73ml) 0.083%  Nebu (Albuterol Sulfate) .... Every 6 Hours As Needed For Rescue Only. 16)  Aspirin 81 Mg Tbec (Aspirin) .... Take One Tablet By Mouth Daily 17)  Metoprolol Tartrate 25 Mg Tabs (Metoprolol Tartrate) .... Take One Tablet By Mouth Twice A Day 18)  Nitrostat 0.4 Mg Subl (Nitroglycerin) .[name].. 1 Tablet Under Tongue At Onset of Chest Pain; You May Repeat Every 5 Minutes For Up To 3 Doses. 19)  Crestor 40 Mg Tabs (Rosuvastatin Calcium) .... Once Daily 20)  Effient 10 Mg Tabs (Prasugrel Hcl) .... Take One Tablet By Mouth Daily 21)  Oxycodone-Acetaminophen 5-325 Mg Tabs (Oxycodone-Acetaminophen) .... As Directed and As Needed  Allergies (verified): 1)  ! Lisinopril 2)  ! Penicillin 3)  ! Erythromycin 4)  ! Darvon 5)  ! Talwin 6)  ! Accupril 7)  ! Sulfa 8)  ! Flu Vaccine  Past History:  Past Medical History: 1. CAD s/p inferior STEMI 12/29/10 with rotablator atherectomy RCA 12/31/10  and DES x 2 RCA 2. Ischemic CM, EF 45% by cath 12/29/10.  3. Diabetes mellitus type 2.   4. Hypertension.   5. Hyperlipidemia.   4. Diverticulosis.   56. Asthma/COPD - asthma dxed at [facility] when she saw them for pna - mid 2000s.   - 06/2008 Fev1 1.33L/59%  - 01/2009 Fev1 1.05L/50%. TLC 93%, RV 153%, DLCO 53% -> restart advair. Refused spiriva   7. 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   8. Recent nephrolithiasis. dec 2008 and jan 2009 9. Sleep Apnea - non compliant with cpap due to smothering sensation. REfused sleep eval in oct 2010 10.  Overweight - BMI 29 11. ICU hospitalization 12/12/2007 through 12/21/2007 for:   - Ventilator dependent respiratory failure x2.  ACE inhibitor allergy suspected   -Hypertension status post cardiac arrest. - cpr on field by ems with ROSC.  . -Nephrolithiasis with a right sided hydronephrosis - sever pain at onset and and had stone removal  -. Pneumonia - bilateral ll consolidation, cleard on CT 02/03/2008  - Acute Renal Failure and Acidosis. 21. Osteoarthritis 13. GERD 14. Anxiety 15. Glaucoma  Past Surgical History: Bilateral tubal ligation.  Lithotripsy 9/11/12009: Lap sigmoid colectomy wiht repain of colovesical fistula Cardiac Catheterization 12/29/10  Family History: Reviewed history from 04/21/2008 and no changes required. Family History of Breast Cancer: Maternal Grandmother Family History of Diabetes: Father, Sisters Family History of Heart Disease: Father No FH of Colon Cancer:  Social History: Reviewed history from 10/29/2008 and no changes required. Patient is a 70 year old female who takes care of her   own ADLs and has a very supportive family.  The patient has a strong   smoking history with over a pack a day for 40+ years and  quit  10/2004. Denies any alcohol or illegal drugs.     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 (Stopped in 2005) Alcohol Use - no Illicit Drug Use - no Daily Caffeine Use  Review of Systems       The patient complains of fatigue and shortness of breath.  The patient denies malaise, fever, weight gain/loss, vision loss, decreased hearing, hoarseness, chest pain, palpitations, prolonged cough, wheezing, sleep apnea, coughing up blood, abdominal pain, blood in stool, nausea, vomiting, diarrhea, heartburn, incontinence, blood in urine, muscle weakness, joint pain, leg swelling, rash, skin lesions, headache, fainting, dizziness, depression, anxiety, enlarged lymph nodes, easy bruising or bleeding, and environmental  allergies.    Vital Signs:  Patient profile:   70 year old female Height:      65 inches Weight:      174.75 pounds BMI:     29.19 Pulse rate:   92 / minute Resp:     18 per minute BP sitting:   124 / 60  (left arm) Cuff size:   regular  Vitals Entered By: Celestia Khat, CMA (January 12, 2011 3:31 PM)  Physical Exam  General:  General: Well developed, well nourished, NAD HEENT: OP clear, mucus membranes moist SKIN: warm, dry Neuro: No focal deficits Musculoskeletal: Muscle strength 5/5 all ext Psychiatric: Mood and affect normal Neck: No JVD, no carotid bruits, no thyromegaly, no lymphadenopathy. Lungs:Clear bilaterally, no wheezes, rhonci, crackles CV: RRR with systolic  murmur, No gallops rubs Abdomen: soft, NT, ND, BS present Extremities: No edema, pulses 2+.    Cardiac Cath  Procedure date:  12/28/2010  Findings:      1. The left main coronary artery had no evidence of disease.   2.  Left anterior descending was a large vessel that coursed to the       apex and had 40% mid stenosis.   3. The circumflex artery had mild plaque disease.   4. The right coronary artery was a large dominant vessel with a       heavily calcified 99% mid stenosis.  The mid to distal vessel had a       99% subtotal occlusion.  The posterior descending artery was       occluded distally, likely from thrombus.   5. Left ventricular angiogram was performed in the RAO projection,       showed inferior wall hypokinesis with ejection fraction of 45%.      EKG  Procedure date:  01/12/2011  Findings:      Sinus rhythm, rate 90 bpm. Poor R wave progression through the precordial leads.   Impression & Recommendations:  Problem # 1:  CAD, NATIVE VESSEL (ICD-414.01)  Stable post MI. Continue ASA, Effient, Crestor, Lopressor.  Check echo in 3 months to assess LV function. Check LFTs  Her updated medication list for this problem includes:    Amlodipine Besylate 2.5 Mg Tabs (Amlodipine  besylate) ..... Once daily    Aspirin 81 Mg Tbec (Aspirin) .[name]... Take one tablet by mouth daily    Metoprolol Tartrate 25 Mg Tabs (Metoprolol tartrate) .[name]... Take one tablet by mouth twice a day    Nitrostat 0.4 Mg Subl (Nitroglycerin) .[name]... 1 tablet under tongue at onset of chest pain; you may repeat every 5 minutes for up to 3 doses.    Effient 10 Mg Tabs (Prasugrel hcl) .[name]... Take one tablet by mouth daily  Orders: EKG w/ Interpretation (93000) Echocardiogram (Echo)  Problem # 2:  MYOCARDIAL INFARCTION, INFERIOR WALL, SUBSEQUENT CARE (ICD-410.42) see above.   Her updated medication list for this problem includes:    Amlodipine Besylate 2.5 Mg Tabs (Amlodipine besylate) ..... Once daily    Aspirin 81 Mg Tbec (Aspirin) .[name]... Take one tablet by mouth daily    Metoprolol Tartrate 25 Mg Tabs (Metoprolol tartrate) .[name]... Take one tablet by mouth twice a day    Nitrostat 0.4 Mg Subl (Nitroglycerin) .[name]... 1 tablet under tongue at onset of chest pain; you may repeat every 5 minutes for up to 3 doses.    Effient 10 Mg Tabs (Prasugrel hcl) .[name]... Take one tablet by mouth daily  Patient Instructions: 1)  Your physician recommends that you schedule a follow-up appointment in: 3 months with Dr. Clifton James 2)  Your physician recommends that you return for fasting lab work in: 3 months-Liver function, total CK.-272.0,414.01 3)  Your physician recommends that you continue on your current medications as directed. Please refer to the Current Medication list given to you today. 4)  Your physician has requested that you have an echocardiogram.  Echocardiography is a painless test that uses sound waves to create images of your heart. It provides your doctor with information about the size and shape of your heart and how well your heart's chambers and valves are working.  This procedure takes approximately one hour. There are no restrictions for this procedure. To be done in 3 months

## 2011-01-19 NOTE — Discharge Summary (Signed)
[name], [name]               ACCOUNT NO.:  [id number]  MEDICAL RECORD NO.:  [id number]           PATIENT TYPE:  I  LOCATION:  3742                         FACILITY:  [facility]  PHYSICIAN:  Vesta Mixer, M.D. DATE OF BIRTH:  Jul 22, 1941  DATE OF ADMISSION:  12/28/2010 DATE OF DISCHARGE:  12/31/2010                              DISCHARGE SUMMARY   CARDIOLOGIST:  Verne Carrow, MD  REASON FOR ADMISSION:  Inferior ST elevation myocardial infarction.  DISCHARGE DIAGNOSES: 1. Coronary artery disease.     a.     Status post angioplasty to the mid RCA in the setting of      acute inferior ST elevation myocardial infarction on this      admission.     b.     Staged PCI on December 29, 2010, with rotablator atherectomy      and drug-eluting stent placement x2 to the mid RCA. 2. Ischemic cardiomyopathy with an EF of 45% with inferior     hypokinesis, left ventriculogram, and cardiac catheterization. 3. Urinary tract infection -- treated with ciprofloxacin on this     admission. 4. Diabetes mellitus. 5. Hypertension. 6. Dyslipidemia. 7. Obstructive sleep apnea. 8. Glaucoma. 9. GERD. 10.Anxiety. 11.Arthritis. 12.COPD.  PROCEDURES:  Procedure performed on this admission; 1. Cardiac catheterization and percutaneous coronary intervention by     Dr. Verne Carrow on December 28, 2010:  Mid RCA 99% followed     by distal RCA 99% and totally occluded distal PDA -- treated with     balloon angioplasty to the mid RCA -- no stent secondary to diffuse     calcification and re-establishment flow down the vessel; and EF 45%     on inferior hypokinesis; mid LAD 40%. 2. Staged PCI December 29, 2010, by Dr. Verne Carrow: rotablator     atherectomy followed by drug-eluting stent placement x2 to the mid     RCA (3.5 x 20 mm and 3.5 x 24 mm PROMUS drug-eluting stent).  ADMISSION HISTORY:  [name] is a 70 year old female with diabetes, hypertension, hyperlipidemia, who  presented on December 28, 2010, with chest pain and EKG changes suggestive of inferior ST elevation myocardial infarction.  She was transferred to [facility] for further evaluation and treatment.  HOSPITAL COURSE:  She was brought emergently to the cardiac catheterization lab.  As noted above, Dr. Clifton James performed balloon angioplasty to the mid RCA.  Given the extensive calcification, she would need Rotablator atherectomy.  She had establishment of flow down the vessel.  Again, it was decided to proceed with this in a staged fashion.  She was brought back to the catheterization lab on December 29, 2010.  She underwent Rotablator atherectomy of the mid RCA and drug- eluting stent placement x2 as outlined above.  She tolerated the procedure well without any complications.  She did have evidence of urinary tract infection.  Her cultures were positive for Serratia marcescens.  This was sensitive to ciprofloxacin with which she was treated.  She had platelet inhibition performed and this was 0%. Therefore, she was loaded with Effient prior to discharge and placed on  10 mg a day.  She is now discharged to home in good condition.  LABORATORY AND ANCILLARY DATA:  At discharge; hemoglobin 10.5, hematocrit 33, and platelet count 173,000.  Sodium 135, potassium 3.9, BUN 15, creatinine 0.84, and glucose 194.  On admission; alkaline phosphatase 82, AST 91, ALT 28, total protein 6.8, hemoglobin A1c 8.4, troponin-I 39.16, total cholesterol 133, triglycerides 270, HDL 25, and LDL 54.  TSH 3.358.  Urinalysis and urine culture as noted above.  Chest x-ray on admission, blunted left costophrenic angle with indistinctness of portions of the lingula.  This is at least partially due to prominent epicardial fat pad.  I cannot exclude an accompanying left pleural effusion or atherosclerosis.  DISCHARGE MEDICATIONS: 1. Aspirin 81 mg daily. 2. Cipro 500 mg b.i.d. for 1 more day. 3. Metoprolol 25 mg  b.i.d. 4. Nitroglycerin for chest pain. 5. Effient 10 mg daily. 6. Rosuvastatin 40 mg daily. 7. Advair 250/50 b.i.d. 8. Albuterol 2 puffs b.i.d. 9. Albuterol nebulizer inhaled every 6 hours as needed. 10.Amlodipine 2.5 mg daily. 11.Flexeril 10 mg 3 times a day as needed. 12.Detrol LA 4 mg daily. 13.Glipizide 5 mg daily. 14.Lyrica 75 mg twice daily. 15.Metformin 500 mg twice daily -- she has been advised to start     taking this on again on January 01, 2011. 16.MiraLax. 17.Oxycodone/APAP 5/325 mg twice daily p.r.n. 18.Timolol ophthalmic solution 0.5% 1 drop both eyes twice daily. 19.Travatan ophthalmic 0.004% 1 drop both eyes at bedtime. 20.Zetia 10 mg daily.  ACTIVITY:  She is to increase her activity slowly.  No lifting or sexual activity for 2 weeks.  No driving for 1 week.  DIET:  Low-fat and low-sodium diabetic diet.  WOUND CARE:  She should call our office for any swelling, bleeding, bruising, or fever.  FOLLOWUP: 1. She will see Dr. Clifton James with a PA Tereso Newcomer in the next 2     weeks for post hospital followup.  She will be contacted for an     appointment. 2. She will need lipids and LFTs checks in about 6-8 weeks. 3. Consideration for followup and 2-D echocardiogram will be made at     her followup visit.  Total physician PA time greater than 30 minutes on discharge.     Tereso Newcomer, PA-C   ______________________________ Vesta Mixer, M.D.    SW/MEDQ  D:  12/31/2010  T:  12/31/2010  Job:  [id number]  cc:   Verne Carrow, MD  Electronically Signed by Tereso Newcomer PA-C on 01/16/2011 08:45:43 AM Electronically Signed by Kristeen Miss M.D. on 01/19/2011 06:15:59 PM

## 2011-01-31 NOTE — Letter (Signed)
Summary: Cardiac Rehab Program  Cardiac Rehab Program   Imported By: Marylou Mccoy 01/26/2011 12:09:23  _____________________________________________________________________  External Attachment:    Type:   Image     Comment:   External Document

## 2011-02-08 ENCOUNTER — Telehealth: Payer: Self-pay | Admitting: Cardiovascular Disease

## 2011-02-08 LAB — COMPREHENSIVE METABOLIC PANEL
ALT: 18 U/L (ref 0–35)
AST: 23 U/L (ref 0–37)
Alkaline Phosphatase: 81 U/L (ref 39–117)
CO2: 29 mEq/L (ref 19–32)
Glucose, Bld: 179 mg/dL — ABNORMAL HIGH (ref 70–99)
Potassium: 3.4 mEq/L — ABNORMAL LOW (ref 3.5–5.1)
Sodium: 139 mEq/L (ref 135–145)
Total Protein: 8 g/dL (ref 6.0–8.3)

## 2011-02-08 LAB — GLUCOSE, CAPILLARY
Glucose-Capillary: 109 mg/dL — ABNORMAL HIGH (ref 70–99)
Glucose-Capillary: 131 mg/dL — ABNORMAL HIGH (ref 70–99)
Glucose-Capillary: 133 mg/dL — ABNORMAL HIGH (ref 70–99)
Glucose-Capillary: 144 mg/dL — ABNORMAL HIGH (ref 70–99)
Glucose-Capillary: 147 mg/dL — ABNORMAL HIGH (ref 70–99)
Glucose-Capillary: 160 mg/dL — ABNORMAL HIGH (ref 70–99)

## 2011-02-08 NOTE — Telephone Encounter (Signed)
Patient currently does not have a question regarding her medication regimen.

## 2011-02-14 ENCOUNTER — Encounter: Payer: Self-pay | Admitting: Cardiovascular Disease

## 2011-03-31 ENCOUNTER — Other Ambulatory Visit (HOSPITAL_COMMUNITY): Payer: Self-pay | Admitting: Radiology

## 2011-03-31 DIAGNOSIS — I2510 Atherosclerotic heart disease of native coronary artery without angina pectoris: Secondary | ICD-10-CM

## 2011-04-03 ENCOUNTER — Ambulatory Visit (HOSPITAL_COMMUNITY): Payer: Medicare Other | Attending: Cardiovascular Disease | Admitting: Radiology

## 2011-04-03 ENCOUNTER — Encounter: Payer: Self-pay | Admitting: Cardiovascular Disease

## 2011-04-03 ENCOUNTER — Other Ambulatory Visit (INDEPENDENT_AMBULATORY_CARE_PROVIDER_SITE_OTHER): Payer: Medicare Other | Admitting: *Deleted

## 2011-04-03 DIAGNOSIS — I2510 Atherosclerotic heart disease of native coronary artery without angina pectoris: Secondary | ICD-10-CM

## 2011-04-03 DIAGNOSIS — E7800 Pure hypercholesterolemia, unspecified: Secondary | ICD-10-CM

## 2011-04-03 LAB — HEPATIC FUNCTION PANEL
ALT: 15 U/L (ref 0–35)
AST: 21 U/L (ref 0–37)
Albumin: 3.7 g/dL (ref 3.5–5.2)
Alkaline Phosphatase: 66 U/L (ref 39–117)
Bilirubin, Direct: 0 mg/dL (ref 0.0–0.3)
Total Bilirubin: 0.2 mg/dL — ABNORMAL LOW (ref 0.3–1.2)
Total Protein: 7.2 g/dL (ref 6.0–8.3)

## 2011-04-03 LAB — CK: Total CK: 35 U/L (ref 7–177)

## 2011-04-04 NOTE — Op Note (Signed)
[name], [name]               ACCOUNT NO.:  [id number]   MEDICAL RECORD NO.:  [id number]          PATIENT TYPE:  INP   LOCATION:  1522                         FACILITY:  [facility]   PHYSICIAN:  Ardeth Sportsman, MD     DATE OF BIRTH:  1941/10/18   DATE OF PROCEDURE:  DATE OF DISCHARGE:                               OPERATIVE REPORT   PRIMARY CARE PHYSICIAN:  Stacie Acres. Cliffton Asters, M.D.   GASTROENTEROLOGIST:  Hedwig Morton. Juanda Chance, MD   SURGEON:  Ardeth Sportsman, MD.   PULMONOLOGIST:  Kalman Shan, MD, [facility]   ASSISTANT:  Velora Heckler, MD   PREOPERATIVE DIAGNOSIS:  Recurrent sigmoid diverticulitis with  abscesses.   POSTOPERATIVE DIAGNOSES:  1. Recurrent sigmoid diverticulitis with abscess.  2. Diverticular colovesical fistula.   PROCEDURE PERFORMED:  1. Laparoscopically-assisted sigmoid colectomy with primary 33 EEA      stapled anastomosis.  2. Appendectomy.  3. Closure of colovesical fistula.   ANESTHESIA:  1. General anesthesia.  2. Local anesthetic in field block around all port sites and incision      sites.  3. On-Q pain pump.   SPECIMENS:  1. Sigmoid colon, silk stitch in the distal end.  2. Anastomosis rings, blue stitches in the proximal end.  3. Appendix.   DRAINS:  A 19-French Blake drain rests along the left paracolic gutter  into the pelvis.   ESTIMATED BLOOD LOSS:  150 mL.   COMPLICATIONS:  None apparent.   INDICATIONS:  Ms. Kosak is a morbidly obese female with a strong  smoking history, who has had recurrence of diverticulitis followed by  Dr. Juanda Chance.  She has had some pulmonary issues that made her not a great  candidate.  She had a recurrent attack and consultation was requested  last month.  Pulmonary had been involved taking care of her and wished  to have her followup as an outpatient for final clearance for  consideration of elective resection and to see if she was a decent  candidate.   However, she developed a  recurrent episode of pain and diverticulitis.  She had recurrent fluid collection.  The fluid was drained and it again  seemed primarily serous.  She has been on chronic oral antibiotics.  She  is also been on chronic nitrofurantoin for chronic cystitis/UTI as well.   Endophysiology of the digastric tract was explained.  Pathophysiology of  diverticulitis with a natural history of risks were discussed, technique  of laparoscopically-assisted versus possible open colonic resection was  discussed.  Possibility of needing a colostomy or anastomotic leak,  injury to other organs and numerous other risks were discussed in  detail.  Questions were answered and she and her family agreed to  proceed.   OPERATIVE FINDINGS:  Her mid sigmoid was twisted upon its mesentery and  densely adherent to the bladder dome.  The appendix was densely adherent  to it as well.  There was no pus or purulence, or major contamination.  The distal rectum appeared normal.  Her ovaries, fallopian tube and  uterus appeared normal.   DESCRIPTION  OF PROCEDURE:  Informed consent was confirmed.  The patient  was already on IV Cipro, nitrofurantoin and metronidazole.  She had  sequential compression devices active during the entire case.  She was  given subcutaneous heparin just prior to surgery.  She was given oral  Entereg, given just prior to surgery.  She underwent general anesthesia  without any difficulty.  She was adjusted in low lithotomy with arms  tucked.  Her abdomen and  perineum were prepped and draped in sterile  fashion.   The 5 mm port was placed in the right upper quadrant, when the patient  was in reverse Trendelenburg and right side up, using optical entry  technique.  Under direct visualization, 5mm ports were placed in the  right and left mid abdomen and in the right lower quadrant and later  supraumbilically.   Camera inspection revealed an obvious large phlegmon resting on the dome  over the  bladder.  There was no evidence of  any carcinomatosis or  metastatic disease or any obvious purulence.  Small bowel could be swept  away.  The sigmoid colon was densely adherent on the anterior abdominal  wall and she had a rather long sigmoid mesentery.  There is no  retromesenteric inflammation that was obvious.  There was a rather large  phlegmon.   The left colon was freed in a lateral and medial fashion from up near  the splenic flexure, down the descending colon and over the sigmoid over  to the large phlegmon.  Formal splenic flexure mobilization was not  needed.   A GelPort was placed through a suprapubic midline incision.  We were  able to carefully free off the attachments of the colon, off laterally  and up posteriorly on the right side.  There was dense adhesions to the  dome of the bladder.  Careful dissection was done to free that off and  in freeing off; there was a hole in the bladder, and I could see the  obvious hole.  We freed that up more with that.  We were able to  mobilize the sigmoid colon off completely.   A window was made at the rectosigmoid junction of the mesentery and it  was stapled off using a Contour 30 stapler.  Mesentery was taken using  bipolar LigaSure, staying close to the colon.  With this I was able to  eviscerate the large, inflamed sigmoid colon phlegmon through the  GelPort with its wound protector.  The descending colon/sigmoid colon  junction was transected.  The sigmoid was resected staying close to the  sigmoid mesentery, removed with the stitch sent.   Copious irrigation done with several liters of saline.  There is no  significant examination.  There is a little bit of bleeding, but  hemostasis was assured.  This is where the bleeding occurred, but  everything was quiet at the end.   The bladder defect was closed using interrupted 3-0 PDS stitches x6 to  good result, with good healthy mucosal approximation.   Camera inspection  revealed no active bleeding or intra-abdominal injury.  The descending colon was adequately mobilized as it fell down into the  pelvis rather easily.  The distal colon was eviscerated, and we went  ahead and took off the staple line.  Because there is not significant  contamination and the anastomosis at the retroflexion would be far away  from where the inflammation and where the bladder repair was, and it  would be between the  uterus and the bladder, I felt it was reasonable to  go ahead and anastomose primarily since the patient was hemodynamically  stable without significant peritonitis or contamination.  Therefore I  opened up the staple line and the sizers, 33 EA sizer easily fit through  the distal end of the descending colon.  An anvil was placed and a 0  Prolene stitch was placed in a pursestring around the end to hold the  anvil in place.   Dr. Gerrit Friends scrubbed down and did rigid proctoscopy.  The rectal stump  was clear.  His able to advance the 33 EA stapler all the way up to the  rectal stump at around 14 cm.  The spike was brought out the anterior  wall and the anvil was attached to the spike and it was brought down.  It was tightened down, held for a minute, fired, held for 30 seconds and  released.  Two excellent anastomotic rings were noted.  Dr. Gerrit Friends redid  rigid proctoscopy and saw a healthy anastomosis with no bleeding.  I  went and clamped the colon gently proximal to the anastomosis.  Dr.  Gerrit Friends did rectal insufflation while I irrigated the pelvis.  There is  no leak of bubbles.  The anastomosis could be inspected and there was no  significant tension at the anastomosis and the anastomosis looked  viable, with no obvious leak.   Copious irrigation was done again.  Inspection was done and the  mesentery was clear.  Small bowel looked fine.  Ports were removed and  wound protector was removed.  I placed a 19-French Blake drain along the  left paracolic gutter down  to the pelvis as noted above.  The fascial  defect was closed using #1 looped PDS x2.  An On-Q pain pump catheter  was placed in the preperitoneal plane, across the anastomosis.  There  was not a 2-catheter system available, so I had to put it basically  across the anastomosis from a right to left fashion.  Skin was closed  using 4-0 Monocryl stitch.  Wicks were placed inbetween the suprapubic  and infraumbilical incision.  Sterile dressing was applied.   The patient was extubated and taken to the recovery room in stable  condition.  I am about to explain the operative findings to the  patient's family.      Ardeth Sportsman, MD  Electronically Signed     SCG/MEDQ  D:  07/31/2008  T:  08/01/2008  Job:  [id number]   cc:   Stacie Acres. Cliffton Asters, M.D.  Fax: [phone]   Hedwig Morton. Juanda Chance, MD  [address]  [place]  [place] [address]   Kalman Shan, MD  [address] [place] [address]  [place] [address]

## 2011-04-04 NOTE — Discharge Summary (Signed)
[name], [name]               ACCOUNT NO.:  [id number]   MEDICAL RECORD NO.:  [id number]          PATIENT TYPE:  INP   LOCATION:  1522                         FACILITY:  [facility]   PHYSICIAN:  Kela Millin, M.D.DATE OF BIRTH:  20-Aug-1941   DATE OF ADMISSION:  07/28/2008  DATE OF DISCHARGE:  08/03/2008                               DISCHARGE SUMMARY   DISCHARGE DIAGNOSES:  1. Recurrent diverticulitis with abscess, status post laparoscopic      sigmoid colectomy.  2. Diverticula colovesical fistula, status post closure of fistula.  3. Status post appendectomy.  4. Cystitis.  5. Chronic obstructive pulmonary disease, mild.  6. Diabetes mellitus.  7. Hypertension.  8. History of obstructive sleep apnea.  9. History of glaucoma.  10.History of fibromyalgia.  11.History of urinary incontinence.  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  13.History of stricture of the colon.  14.History of kidney stones.  15.History of glaucoma.   PROCEDURES AND STUDIES:  1. CT scan of abdomen and pelvis, stable.  Abdomen:  No evidence of      intra-abdominal abscess or other acute findings.  CT scan of the      pelvis with increased size of right anterior pelvic fluid      collection and surrounding inflammatory changes, suspicious for      abscess.  Moderate sigmoid diverticulitis is otherwise unchanged in      appearance.  2. CT aspiration of recurrent fluid collection in the right lower      quadrant of the abdomen on July 29, 2008, 15 mL of fluid      aspirated and sample sent for studies.   CONSULTATIONS:  1. Surgery, Dr. Michaell Cowing.  2. Interventional radiology, Dr. Fredia Sorrow.   BRIEF HISTORY:  The patient is a 70 year old white female with the above-  listed medical problems who presented with complaints of abdominal pain  and decreased p.o. intake.  It was noted that the patient had been  discharged in August following  admission for the same and was seen by  surgery, Dr. Michaell Cowing, who recommended discharging the patient on oral  antibiotics and she was to be scheduled for surgery upon outpatient  follow-up with him.   In the ER, the patient had a urinalysis which was consistent with a  urinary tract infection.  Her white cell count was noted to be elevated  at 13.5.  A CT scan of her abdomen and pelvis was ordered and results as  stated above.  She was admitted for further evaluation and management.   Please see the full admission history and physical dictated by Dr.  Adela Glimpse for the details of the admission physical exam as well as the  laboratory data.   HOSPITAL COURSE:  1. Recurrent diverticulitis with abscess.  Upon admission the patient      was placed on empiric antibiotics and CT scan of abdomen and pelvis      were done and the results as stated above.  Surgery was consulted      and the [facility]  saw the patient.  It was noted on the CT      scan that the right lower abdominal fluid collection was increased      and so they recommended CT aspiration per interventional radiology      and this was done on July 29, 2008.  It was sent for cultures      and it did not grow any bacteria.  Pulmonology was consulted for      preop clearance.  Dr. Delton Coombes saw the patient and indicated that she      had mild to moderate increased risk for any surgery under general      anesthesia, but certainly that does not preclude a procedure if      risks do not outweigh the benefits.  They recommended that the      patient continue her outpatient medications.  Following this, Ms.      [name] was scheduled for surgery which was done on July 31, 2008.  Dr. Michaell Cowing did laparoscopic assisted  sigmoid colectomy with      primary 21 EEA stapled anastomosis and also an appendectomy was      done in the process.  Following the surgery, she was monitored in      the ICU.  She did well and was started on  liquid diet which was      advanced as tolerated to a regular diet which she has tolerated      well.  Dr. Michaell Cowing saw the patient on rounds today and has      recommended that she be discharged home.  She will not need any      further antibiotics upon discharge.  Postop instructions have been      given to the patient and he is to follow up with Dr. Michaell Cowing next      Tuesday on September 22.  Wound care instructions have also been      given to the patient per surgery.  2. Diverticula colovesical fistula.  Dr. Michaell Cowing did closure of the      colovesical fistula.  The patient is being discharged with the      Foley catheter until she has a cystogram next Tuesday to make sure      that the bladder repair has healed.  She has been instructed to      flush the Foley catheter as needed if not draining with sterile      water/saline.  3. Cystitis.  The patient's urinalysis upon admission was consistent      with a UTI.  The urine cultures were done and the patient      empirically started on antibiotics.  The urine appearance was      likely secondary to the colovesical fistula.  4. Diabetes mellitus.  The patient's Accu-Cheks were monitored and she      was covered with sliding scale while in the hospital.  She is to      resume outpatient medications upon discharge.  5. Hypertension.  The patient is to continue her outpatient      medications upon discharge.  6. COPD.  The patient is to continue her preadmission medications upon      discharge.   DISCHARGE MEDICATIONS:  1. Oxycodone one to two q.4 h. p.r.n.  2. Tylenol p.r.n.  The patient is to continue:  1. Glipizide 5 mg p.o. daily.  2. Lyrica 75 mg p.o. b.i.d.  3.  Metformin 1000 p.o. b.i.d.  4. Mucinex p.o. p.r.n.  5. Albuterol MDI p.r.n.  6. Norvasc 2.5 mg p.o. daily.  7. Advair 250/50 mg b.i.d.  8. Aleve p.r.n.  9. Allegra p.r.n.  10.Detrol 4 mg p.o. daily.   The patient has been instructed to discontinue Cipro and Flagyl.    FOLLOW-UP CARE:  1. Dr. Karie Soda in one week on August 11, 2008.  2. Dr. Laurann Montana in one to two weeks.   CONDITION ON DISCHARGE:  Improved.  Stable.      Kela Millin, M.D.  Electronically Signed     ACV/MEDQ  D:  08/03/2008  T:  08/03/2008  Job:  [id number]   cc:   Stacie Acres. Cliffton Asters, M.D.  Fax: [phone]   Ardeth Sportsman, MD  [address] [place] [address]  [place]

## 2011-04-04 NOTE — Consult Note (Signed)
NAMELAMEES, GABLE NO.:  [id number]   MEDICAL RECORD NO.:  [id number]          PATIENT TYPE:  INP   LOCATION:  1436                         FACILITY[facility]  [facility]   PHYSICIAN:  Juanetta Gosling, MDDATE OF BIRTH:  07-28-1941   DATE OF CONSULTATION:  07/29/2008  DATE OF DISCHARGE:                                 CONSULTATION   [name] is a 70 year old female with the history of recurrent  diverticulitis and multiple hospital admissions.  She has had, by her  report, four to five hospital admissions over the last year and received  IV antibiotics for recurrent diverticulitis.  She, by report, has a  colonoscopy that she reports to have no evidence of a colon cancer.  She  was last admitted in August for an attack of diverticulitis, where she  was placed on IV antibiotics and then transitioned to oral antibiotics.  She had a fluid collection at that time that was aspirated on August 10  with return of 10 mL of clear fluid.  She went home on ciprofloxacin and  Flagyl, and she saw Dr. Michaell Cowing in the office last week, as well, and  underwent a repeat CT scan that showed a small fluid collection  anteriorly in the lower right pelvis, measuring 3 by 1.9 cm, which is a  little bit smaller than previous.  Since this last Sunday, she has had  progressive abdominal pain, decreased p.o. intake and feels like she is  having a recurrence to her diverticulitis.  She presented to her primary  care Jalyssa Fleisher, who sent her to the emergency room.  In the emergency  room, she was noted to have all of these findings.  She underwent a  repeat CT scan that shows increased size of this right anterior pelvic  fluid collection with surrounding inflammatory changes, suspicious for  an abscess with moderate sigmoid diverticulitis.  The fluid collection  now measures 6.3 by 2.7 cm in size.  She was then admitted by the  primary team and we were consulted.   PAST MEDICAL HISTORY:  1. Chronic  obstructive pulmonary disease.  2. ACE inhibitor reaction with respiratory failure.  3. Diabetes.  4. Nephrolithiasis.  5. Recurrent diverticulitis with colonic stricture.  6. Urinary incontinence.  7. Possible fibromyalgia.   PAST SURGICAL HISTORY:  She only has a bilateral tubal ligation.   MEDICATIONS:  1. Aleve as needed.  2. Allegra 180 mg p.r.n.  3. Detrol 4 mg daily.  4. Glipizide 5 mg daily.  5. Lyrica 75 mg b.i.d.  6. Metformin 100 mg b.i.d.  7. Mucinex p.r.n.  8. Percocet p.r.n. anxiety and abdominal pain.  9. Albuterol p.r.n.  10.Norvasc 2.5 mg daily.  11.Advair 250/50 inhaled b.i.d.  12.Cipro and Flagyl p.o., which is what she had taken at home for the      diverticulitis.   ALLERGIES:  Her allergies include DARVON, ERYTHROMYCIN, PENICILLIN, ACE  INHIBITORS, ARBs AND TALWIN.   FAMILY HISTORY:  Noncontributory.   SOCIAL HISTORY:  She does have a greater than 50 pack-year smoking  history, but quit four years ago.  She did not smoke currently.  She  lives with her husband.   PHYSICAL EXAMINATION:  VITAL SIGNS:  Temperature of 98, pulse of 93,  blood pressure 145/60, sats of 96.  GENERAL:  She is morbidly obese, in no acute distress.  NECK:  Supple without adenopathy.  LUNGS:  Occasional crackles.  She does not have any wheezes.  HEART:  Regular rate and rhythm.  ABDOMEN:  Obese, slightly distended.  She has suprapubic left lower  quadrant and right lower quadrant tenderness to examination without  peritoneal signs.  LOWER EXTREMITIES:  Show no edema.   LABORATORY RESULTS:  On admission, showed a white blood cell count of  13.5.  It is 9.5 today.  Her LFT were normal and her BUN and creatinine  are 8 and 0.63.  Her lipase is 13.  She had a UA, which showed positive  for nitrites and she has been treated for a urinary tract infection.  Her CT scan is as described in her HPI.   ASSESSMENT:  Recurrent diverticulitis.   PLAN:  1. Discussed with [name].   Also discussed her case with Dr.      Michaell Cowing, her attending surgeon.  Continue her antibiotics.  Continue      her to be n.p.o.  Discuss with interventional radiology possible      drainage of the right lower quadrant abscess.  If they are able to      drain that, then continue with the IV antibiotics and we will plan      on performing a sigmoid colectomy in the future.  If they are      unable to drain that, then we will have to consider taking her to      the operating room sooner for a sigmoid colectomy with likely a      Hartmann pouch and an end colostomy for the near future.  I have      discussed this plan with her and Dr. Michaell Cowing will take over tomorrow      for her care.  2. Her pulmonary issues were being evaluated by pulmonary critical      care and we will plan on consulting them prior to her surgery, as      well.  She was supposed to see them as an outpatient, but now we      can have them consult as an inpatient for perioperative risk      management.      Juanetta Gosling, MD  Electronically Signed     MCW/MEDQ  D:  07/29/2008  T:  07/29/2008  Job:  [id number]   cc:   Ardeth Sportsman, MD  [address] [place] [address]

## 2011-04-04 NOTE — H&P (Signed)
[name], [name]               ACCOUNT NO.:  [id number]   MEDICAL RECORD NO.:  [id number]          PATIENT TYPE:  INP   LOCATION:  1436                         FACILITY:  [facility]   PHYSICIAN:  Michiel Cowboy, MDDATE OF BIRTH:  10-21-1941   DATE OF ADMISSION:  07/28/2008  DATE OF DISCHARGE:                              HISTORY & PHYSICAL   PRIMARY CARE PHYSICIAN:  Stacie Acres. White, M.D.   CHIEF COMPLAINT:  Abdominal pain.   HISTORY OF PRESENT ILLNESS:  The patient is a 70 year old female with  history of recurrent diverticulitis with repeat admissions.  Last time  was seen by Dr. Michaell Cowing who stated that the patient probably would benefit  from laparoscopic partial colectomy for recurrent diverticulitis, but  first wanted to make sure she gets hold off of IV antibiotics and then  n.p.o. antibiotics.  The patient went home on continuous call of course  of Flagyl and Cipro, but returns because she still has worsening  progressive abdominal pain and decreased p.o. intake and she feels like  she is having a recurrence of diverticulitis.  At this point, she  presented to her primary care Coren Crownover who sent her back to the  emergency department.  In the ED, unfortunately, [facility] hospitalist was  not called immediately, but rather 4 hours later secondary to high  patient load.  But the labs obtained in the emergency department did  show that the patient does have a mild urinary tract infection.  Of note  she does have recurrent urinary tract infections.  Also, she has  elevated white blood cell count of 13.5.   REVIEW OF SYSTEMS:  No chest pain, shortness of breath at baseline as  the patient always has some degree of shortness of breath secondary to  her COPD.  The patient does endorse abdominal pain, no burning on  urination.  The patient had regular bowel movements and had one today,  and she does endorse taking MiraLax as needed for that.  Otherwise,  review of systems  unremarkable.  No fevers.  She did feel a little bit  cold in the emergency department, but feels it is because the thermostat  was set too low.  Otherwise, 12 systems unremarkable.   PAST MEDICAL HISTORY:  1. Significant for COPD.  2. History of intubation secondary to what was thought to be ACE      inhibitor reaction.  3. Diabetes.  4. Recurrent kidney stones and ureteral stones.  5. Recurrent diverticulitis with colonic stricture.  6. Rectal polyp, status post removal.  7. Urine incontinence.  8. Questionable fibromyalgia.   ALLERGIES:  DARVON, ERYTHROMYCIN, PENICILLIN, ALL ACE INHIBITORS, ALL  ARB'S, TALWIN.   CURRENT MEDICATIONS:  1. Aleve as needed.  2. Allegra 180 mg as needed for sinusitis.  3. Detrol 4 mg p.o. daily.  4. Glipizide 5 mg p.o. once daily.  5. Lyrica 75 mg p.o. twice a day.  6. Metformin 100 mg p.o. twice a day.  7. Mucinex as needed.  8. Percocet as needed for abdominal pain and anxiety 10 mg p.o. daily.  9. Albuterol inhaler  as needed,  10.Albuterol nebs as needed for severe shortness of breath.  11.Norvasc 2.5 mg p.o. daily  12.Advair 250/50 inhaled b.i.d.  13.P.o. Cipro and p.o. Flagyl for the past 3-4 weeks, status post      discharge, unfortunately the discharge note does not have.   FAMILY HISTORY:  Noncontributory.   SOCIAL HISTORY:  Has an 80 pack year smoking history but quit 3 years  ago.  Does not smoke currently, does not use drugs.  Lives with husband.   PHYSICAL EXAMINATION:  VITALS:  Temperature 98.1, blood pressure 139/60,  pulse 100, respirations 20, sating 100% on room air .  GENERAL:  The patient appears to be in no acute distress, morbidly  obese, sitting in the stretcher.  HEENT:  Head nontraumatic.  Moist mucous membranes.  PERRLA.  Oropharynx  with erythema.  NECK:  Supple.  On lymphadenopathy.  No JVD present.  LUNGS:  Occasional crackles and distant breath sounds but no wheezes.  HEART:  Regular rate and rhythm.  No  murmurs, rubs or gallops.  ABDOMEN:  Obese, slightly distended, flattened-out umbilicus which says  usually gets that way when she has a diverticulitis, unsure what the  relation is.  A lot of suprapubic tenderness.  No definite guarding.  No  evidence of acute abdomen.  LOWER EXTREMITIES:  No edema.  No clubbing or cyanosis.  NEUROLOGICAL:  Cranial nerves II-XII intact.  Strength intact  bilaterally.   LABORATORY DATA:  White blood cell count 13.5, hemoglobin 13.4, sodium  139, potassium 3.8, creatinine 0.72.  LFTs within normal limits.  Lipase  13.  UA positive for nitrites and white blood cell count.   No imaging was obtained by ED.   ASSESSMENT/PLAN:  1. Recurrent diverticulitis.  The patient presents again.  It is      possible that this is a recurrence of worsening of her fluid      collection which she actually has chronically and previous CT was      improving, but possibly it is getting worse now since she is      actually localizing her tenderness to suprapubic region where the      fluid collection is.  The patient had been on n.p.o. products with      apparently no improvement.  Will give another dose of IV Cipro and      Flagyl as the patient is allergic to penicillin.  This is again a      recurrence, I would think that if the patient is a reasonable risk      operatively, she probably needs the intervention since this has      been an ongoing process.  Would recommend again reconsulting to Dr.      Michaell Cowing in the morning since he knows the patient best.  Will make      the patient n.p.o., send blood cultures.  2. Urinary tract infection.  The patient keeps on having recurrent      urinary tract infection despite that she was on Cipro allergic to      penicillin.  Will try Macrobid and will try again to send for urine      culture.  The urine cultures so far have not grown anything.  I      wonder if the patient has leukouria without actually a true      infection or she  possibly has recurrent colonization.  3. Diabetes.  Will do sliding scale.  Hold metformin  and glipizide      while n.p.o.  Also, hold metformin as the patient will need a CT      scan imaging.  4. History of chronic obstructive pulmonary disease.  Continue home      medications.  5. Prophylaxis:  Protonix plus SCDs.  6.  Code status.  The patient wishes to be full code.      Michiel Cowboy, MD  Electronically Signed     AVD/MEDQ  D:  07/29/2008  T:  07/29/2008  Job:  [id number]   cc:   Ardeth Sportsman, MD  [address] [place] [address]  [place]. Cliffton Asters, M.D.  Fax: [phone]

## 2011-04-04 NOTE — Op Note (Signed)
[name], [name]               ACCOUNT NO.:  [id number]   MEDICAL RECORD NO.:  [id number]          PATIENT TYPE:  INP   LOCATION:  1230                         FACILITY:  [facility]   PHYSICIAN:  Lindaann Slough, M.D.  DATE OF BIRTH:  [birth year]   DATE OF PROCEDURE:  12/13/2007  DATE OF DISCHARGE:                               OPERATIVE REPORT   PREOPERATIVE DIAGNOSES:  Right ureteral stone.   POSTOPERATIVE DIAGNOSES:  Right ureteral stone.   PROCEDURE:  Cystoscopy, right retrograde pyelogram, ureteroscopy, stone  extraction, insertion of double-J catheter.   SURGEON:  Dr. Brunilda Payor.   ANESTHESIA:  General.   INDICATIONS:  The patient is a 70 year old female who was seen in  consultation by Dr. Sherron Monday for a right distal ureteral stone. The  patient was found unresponsive on her front porch. EMS resuscitated the  patient. She was then transferred to [facility] for further  evaluation.  A CT scan showed right hydronephrosis and a 4.5-mm stone in  the right distal ureter.  Dr. Sherron Monday felt that, because of the size  of the stone, she would probably pass it.  However, on December 12, 2007,  she had severe right lower quadrant pain and she went into acute  respiratory failure. She was then intubated.  Dr. Delton Coombes felt that the  respiratory failure was secondary to the acute flank pain and that it  would be best to remove the stone at this time to prevent another  episode of respiratory failure.  The patient was then transferred to  [facility] and is taken to the OR for stone manipulation.   Under general anesthesia, the patient was prepped and draped and placed  in the dorsal lithotomy position.  A proper time out was done and the  patient was identified with her wrist band. The Foley catheter that was  previously placed in the bladder was removed. A panendoscope was then  inserted in the bladder.  The bladder mucosa is reddened. There is no  stone or tumor in the bladder.   The ureteral orifices are in normal  position and shape.   Retrograde pyelogram:  A cone-tip catheter was then passed through the cystoscope into the  right ureteral orifice.  Contrast was then injected through the cone-tip  catheter.  There is a filling defect in the distal ureter and the ureter  proximal to that filling defect is moderately dilated. The cone-tip  catheter was then removed.   A sensor tip guidewire was then passed through the cystoscope into the  right ureter. The cystoscope was then removed.  The ureteroscope was  then passed in the bladder. During the manipulation with the  ureteroscope, the stone dropped in the bladder.  The ureteroscope was  passed in the ureter without difficulty and advanced all the way up into  the renal pelvis.  There is no evidence of stone in the ureter.  The  ureteroscope was then removed.  The guidewire was backloaded into the  cystoscope and a #6-French - 26 double-J catheter was passed over the  guidewire. The proximal curl of the double-J  catheter is in the renal  pelvis. The distal curl is in the bladder.  A string  was left attached to the stent.  The cystoscope and guidewire were then  removed.  The bladder was then irrigated with normal saline and then the  stone was irrigated out.  Then a #16 Foley catheter was inserted in the  bladder.   The patient tolerated the procedure well and left the OR in satisfactory  condition to post anesthesia care unit.      Lindaann Slough, M.D.  Electronically Signed     MN/MEDQ  D:  12/13/2007  T:  12/13/2007  Job:  [id number]   cc:   Leslye Peer, MD  [address]  [place], [place] [address]

## 2011-04-04 NOTE — H&P (Signed)
NAMENATESHA, [name]:  [id number]   MEDICAL RECORD NO.:  [id number]          PATIENT TYPE:  INP   LOCATION:  1504                         [facility]ACILITY:  [facility]   PHYSICIAN:  Hettie Holstein, D.O.    DATE OF BIRTH:  01-11-1941   DATE OF ADMISSION:  09/04/2007  DATE OF DISCHARGE:                              HISTORY & PHYSICAL   PRIMARY CARE PHYSICIAN:  Gabriel Earing, M.D. at [facility].   HISTORY OF PRESENT ILLNESS:  [name] is a very pleasant 70 year old  female with a previous history of diverticulitis recently diagnosed in  August and treated as an outpatient.  Had been doing fairly well up  until a couple of days ago when she developed consist abdominal pain and  discomfort.  She presented to [facility] where she underwent evaluation  for a left lower quadrant pain and subsequently was referred to [facility] emergency department where she was discovered to have  diverticulitis which was consistent with her clinical examination.  She  had some nausea and diarrhea and her WBC was 18,000.  She is being  admitted for diverticulitis.   PAST MEDICAL HISTORY:  1. COPD.  2. Diabetes non-insulin requiring.  3. Hypertension.  4. Dyslipidemia.  5. Status post bilateral tubal ligation.  6. She retains her gallbladder and appendix.  7. Denies any other surgeries.  8. She reports early glaucoma.   MEDICATIONS:  Per list that she provides:  1. Metformin 1 gram p.o. b.i.d.  2. Glipizide ER 10 mg daily.  3. Lyrica 75 mg twice day.  4. Zetia. 10 mg once a day.  5. Detrol LA 4 mg daily.  6. Allegra 180 daily p.r.n.  7. Percocet b.i.d. as needed.  8. Albuterol 2 puffs q.i.d. p.r.n. she uses twice a week.  9. Accupril 40 mg daily.  10.Betimol eye drops two twice daily.  11.Travatan once daily.  12.Advair 250/50 one puff b.i.d., recently resumed.   ALLERGIES:  DARVON, PENICILLIN, SULFA, TALWIN.  SHE DEVELOPS AN ITCH  WITH PENICILLIN AND SULFA.   SOCIAL HISTORY:   She quit smoking about 2 years ago.  She quit drinking  also.  She denies any drug abuse.  She is former Production designer, theatre/television/film for [employer].  She has two children and she is married.   FAMILY HISTORY:  Noncontributory.   REVIEW OF SYSTEMS:  Significant for weight loss about 13-14 pounds over  the past month.  Some vomiting and diarrhea as described above.  She had  some intermittent swelling of left lower extremity, this has been  longstanding.  Otherwise, no blood in her stools.  She has no chest pain  or shortness of breath.  Further review is unremarkable.   PHYSICAL EXAMINATION:  VITAL SIGNS:  Emergency department temperature  was 98, blood pressure 145/70, heart rate 99, respirations 18, O2  saturation 94%.  HEENT:  Reveals head normocephalic, atraumatic.  HEART:  Heart rate reveals regular rate and rhythm.  LUNGS: Clear.  ABDOMEN:  Obese, with left lower quadrant tenderness, some right lower  quadrant tenderness which she describes as chronic.  EXTREMITIES:  Reveal no edema.   LABORATORY DATA:  WBCs 18.2, hemoglobin 14.2, platelet count 196, MCV of  94.  Sodium 130, potassium 36, BUN 9, creatinine 0.79 and glucose 150.  Urinalysis revealed WBC 3-6.  CT as noted above.  Revealed uncomplicated  sigmoid diverticulitis and nonobstructive right renal calculi and  fullness at the ileocecal valve with recommendations to review this area  if she does undergo colonoscopy.   ASSESSMENT:  1. Diverticulitis, recurrent.  2. Diabetes mellitus.  3. Obesity.  4. Hyperlipidemia  5. Nephrolithiasis.  6. UTI.  7. Fullness at right ileocecal valve region.   PLAN:  At this time, we will admit [name] for IV antibiotics and  pain control of fluids.  Will recommend GI evaluation at some point,  perhaps after resolution of this acute bout, as she has not had  screening colonoscopy and she had some findings that may need further  evaluation via colonoscopy      Hettie Holstein, D.O.   Electronically Signed     ESS/MEDQ  D:  09/05/2007  T:  09/06/2007  Job:  [id number]   cc:   Gabriel Earing, M.D.  Fax: [phone]

## 2011-04-04 NOTE — Discharge Summary (Signed)
NAMEKRISTIANE, [name]               ACCOUNT NO.:  [id number]   MEDICAL RECORD NO.:  [id number]          PATIENT TYPE:  INP   LOCATION:  1230                         FACILITY:  [facility]   PHYSICIAN:  Casimiro Needle B. Sherene Sires, MD, FCCPDATE OF BIRTH:  1941/01/07   DATE OF ADMISSION:  12/12/2007  DATE OF DISCHARGE:  12/21/2007                               DISCHARGE SUMMARY   DISCHARGE DIAGNOSES:  1. Ventilator dependent respiratory failure x2.  2. Hypertension status post cardiac arrest.  3. Nephrolithiasis with a right sided hydronephrosis.  4. Abdominal pain.  5. Pneumonia.  6. Hyperglycemia.   HISTORY OF PRESENT ILLNESS:  [name] is a 70 year old white  female with a history of asthma who presented to the emergency  department on January 20th, after complaining of flank pain and  respiratory distress.  She had been diagnosed with kidney stones a few  days prior to admission.  On the scene, she developed acute distress and  required orotracheal intubation by emergency medical services on January  20th with some difficulty.  The etiology of the distress was unknown at  that time and it was considered likely to be acute exacerbation of COPD  and possible ACE effect of upper airway.  There is some suggestion of  pneumonia versus negative pressure pulmonary edema and a kidney stone  with a normal creatinine with hydronephrosis seen on CT.   The antibiotics consisted of vancomycin from January 20th to January  22nd; Cipro from January 20th to January 22nd; Flagyl from January 20th  to January 22nd; Avelox from January 22th to January 23rd; Levaquin from  January 23rd to December 20, 2007.  Blood cultures negative.  Urine  culture demonstrated E. coli resistant to Cipro.   EVENTS:  She had a CT of the chest that showed no PE, consolidation of  bilateral lower lobes, and right hydronephrosis with ureteral  dilatation.  She was extubated  on December 11, 2007 with subsequent  reintubation on  January 22nd with extubation on December 16, 2007.  She  had a right IJ triple lumen catheter first endotracheal tube from  January 20th to January 21st.  She had a urostomy with stone extraction on January 23rd with a double-J  stent with a thread left in place with subsequent removal on December 20, 2007.  She had a swallowing evaluation on January 29th.  Also, she had a colonoscopy performed by the GI service.  She has a  history of diverticulosis.  She is to be on a diet with no seeds, no  nuts, and no cabbage.   LABORATORY DATA:  Sodium 142, potassium 4.2, chloride 102, bicarb 32,  creatinine 0.97, calcium 9.5, glucose 221, BUN is 39.  Chest x-ray was  unremarkable.   HOSPITAL COURSE:  1. Acute respiratory failure with pulmonary infiltrates, most likely      secondary to ACE inhibitors with negative pressure pulmonary edema;      therefore, she should list ACE inhibitor as an allergy from now on.      She remains intubated and required a second intubation after  developing stridor after being re-exposed to ACE inhibitors.  She      will be left off all ACE inhibitors.  She no longer requires      oxygen.  There is some question of her having obstructive sleep      apnea but she has not utilized a CPAP machine and refused to do so.  2. Hypertension and cardiac arrest.  The cardiac arrest was felt to be      secondary the upper airway problem  and has resolved without any      sequelae.  Hypertension is controlled.  She is not requiring      antihypertensives at this time.  Her Lasix has been discontinued.  3. Nephrolithiasis.  She had a stent placed by Dr. Lindaann Slough.      The stent was removed on December 20, 2007.  She will follow up with      Dr. Sherron Monday in the urologic clinic in the future.  4. Abdominal pain and tenderness.  She has a history of adhesions and      diverticulosis and stenosis.  She had a GI evaluation on December 13, 2007.  She will be followed  as an outpatient.  5. Suspect pneumonia.  She was treated with empiric antibiotics.  6. Hyperglycemia.  She did not require any oral agents at this time.      These will be restarted by her primary care physician, Dr. Andi Devon.   DISCHARGE MEDICATIONS:  She will only be on:  1. Detrol 4 mg one a day.  2. Darvocet 5/325 every six hours as needed.   She will be followed up as an outpatient by:  1. Dr. [name] on February 9th.  2. Dr. Sherron Monday on February 3rd at 1:50 p.m.  3. Dr. Andi Devon of [facility] in the near future.   DISPOSITION/CONDITION ON DISCHARGE:  Improved.      Devra Dopp, MSN, ACNP      Charlaine Dalton. Sherene Sires, MD, [facility]  Electronically Signed    SM/MEDQ  D:  12/20/2007  T:  12/20/2007  Job:  [id number]   cc:   Martina Sinner, MD  Fax: [phone]   Kalman Shan, MD  [address] [place] [address]  [place] [address]   Lindaann Slough, M.D.  Fax: [phone]   Gabriel Earing, M.D.  Fax: [phone]

## 2011-04-04 NOTE — H&P (Signed)
NAMELASHE, OLIVEIRA               ACCOUNT NO.:  [id number]   MEDICAL RECORD NO.:  [id number]          PATIENT TYPE:  INP   LOCATION:  1534                         FACILITY:  [facility]   PHYSICIAN:  Michiel Cowboy, MDDATE OF BIRTH:  18-Dec-1940   DATE OF ADMISSION:  06/24/2008  DATE OF DISCHARGE:                              HISTORY & PHYSICAL   PRIMARY CARE Unnamed Zeien:  Dr. Laurann Montana.   CHIEF COMPLAINT:  Abdominal pain.   HISTORY OF PRESENT ILLNESS:  [name] is a 70 year old female with  history of recurrent diverticulitis who has a history of COPD and  respiratory distress in January of this year requiring intubation.  The  patient had been having some fevers, chills and abdominal pain for about  a week or so.  She went to her primary care [name] who started her on  p.o. Cipro and Flagyl with no significant improvement of her symptoms  over the past few days at which point her primary care doctor told her  to go ahead and present to emergency department for further treatment  which she did.  The patient endorses that for the past couple days she  had been having some chills.  No particular fever, no cough.  No chest  pain.  She does get occasional shortness of breath but this resolves  with a nebulizer treatments, otherwise, review of systems are negative.   PAST MEDICAL HISTORY:  1. COPD.  2. Diabetes.  3. History kidney stone.  4. Stricture of colon.  5. History of respiratory distress requiring intubation.  6. Urinary incontinence.   SOCIAL HISTORY:  Patient is married.  Does not use drugs.  Does not  drink, quit smoking 3 years ago.   FAMILY HISTORY:  Noncontributory.   ALLERGIES:  ACUPRIN, DARVON, ERYTHROMYCIN, PENICILLIN.   CURRENT MEDICATIONS:  1. Allegra 180 mg p.o. as needed.  2. Detrol 4 mg p.o. once a day.  3. Glipizide 5 mg p.o. once a day.  4. Lyrica 75 mg twice a day.  5. Metformin 500 mg p.o. twice a day.  6. Mucinex as needed.  7.  Acetaminophen as needed.  8. Zetia.10 mg p.o. daily.  9. Advair inhaler b.i.d.  10.Albuterol as needed p.r.n..   PHYSICAL EXAMINATION:  VITALS:  Temperature 97.6, blood pressure 151/61,  pulse 86, respirations 18, sating 96% on air.  GENERAL:  The patient is an elderly female in no acute distress.  HEENT:  Nontraumatic.  Moist mucous membranes.  LUNGS:  Clear to auscultation bilaterally.  HEART:  Regular rate and rhythm.  No murmurs, rubs or gallops.  ABDOMEN:  Obese with some lower abdominal tenderness, but no rebound or  guarding.  LOWER EXTREMITIES: Without edema.  NEUROLOGIC:  Intact.   LABORATORY DATA:  White blood cell count is 7.9, hemoglobin so 12.5,  platelets 166, blood sugars 103.  Sodium 140, potassium 4.1, creatinine  0.79, lipase 12.  UA showing white blood cell count 11-20, with few  bacteria and yeast.   CT scan of the abdomen showing sigmoid diverticulitis.   ASSESSMENT/PLAN:  This is a 70 year old female with  recurrent  diverticulitis and urinary tract infection.  1. Diverticulitis.  We will start Cipro and Flagyl I.V.  Admit as in-      patient.  For now, hold n.p.o.  May start diet in the morning with      liquids, see how she tolerates.  Send blood cultures.  The patient      had some chills, although, probably will yield.  Would recommend in      a.m. surgical consult if the patient has recurrent diverticulitis      and may need surgical intervention.  2. Urinary tract infection.  This should be covered by Cipro.  Will      send urine culture.  3. COPD.  Continue home medications.  Currently stable.  4. Diabetes.  Will hold glipizide and metformin.  The patient will be      on sliding scale insulin.  5. Prophylaxis Protonix plus SCDs.  6. Constipation.  The patient reports no bowel movements for the past      two days.  Will give MiraLax and stool softeners.      Michiel Cowboy, MD  Electronically Signed     AVD/MEDQ  D:  06/25/2008  T:   06/25/2008  Job:  [id number]   cc:   Stacie [place]. Cliffton Asters, M.D.  Fax: [phone]

## 2011-04-04 NOTE — Assessment & Plan Note (Signed)
[facility]                         GASTROENTEROLOGY OFFICE NOTE   NAME:Yellowhair, CHARLIE CHAR                      MRN:          [id number]  DATE:10/29/2007                            DOB:          [birth year]    The patient is a 70 year old white female with recent diverticulitis and  severe diverticulosis on the sigmoid colon on colonoscopy October 16, 2007.  She had a partial obstruction of the sigmoid colon which was  difficult to negotiate with the pediatric colonoscope, but we were able  to get through.  The rest of the colon was fine.  She had some benign  polyps of the colon which were adenomatous.  There was no dysplasia.  She is doing reasonably well on Benefiber one tablespoon twice a day  having regular bowel movements.  She on occasion takes Oxycodone for her  low back pain and for that reason she may take an extra laxative.  During her colonoscopy, the patient had sleep apnea and her O2  saturations were down to 57%.  She is at high risk for surgery and we  told her the sigmoid resection would have to be on emergency basis only  if she has complete perforation or abscess formation.  She is doing  reasonably well now on regular diet and is trying to lose weight, about  20 pounds in the last two months.   PHYSICAL EXAMINATION:  VITAL SIGNS:  Blood pressure 156/94, pulse 80,  and weight 178 pounds.  GENERAL:  She was alert and oriented and in no acute distress.  LUNGS:  Clear to auscultation.  HEART:  Normal S1 and S2.  ABDOMEN:  Protuberant, obese, with essentially no muscle tone.  Her  abdominal wall was one large hernia.  She was tender throughout, but  mostly in the left lower and left middle quadrant.  No palpable mass.  RECTAL:  Not repeated.   IMPRESSION:  A 70 year old white female with severe left colon  diverticulosis with partial obstruction and recent episode of  diverticulitis.  She is at significantly increased risk for  general  anesthesia because of her sleep apnea.  For that reason, we would prefer  to treat her medically.  She will have to stay on high fiber diet with  fiber supplements, minimize her pain medications and take stool  softeners or mild over-the-counter laxative to prevent her from being  constipated.  We will see her on p.r.n. basis.     Hedwig Morton. Juanda Chance, MD  Electronically Signed    DMB/MedQ  DD: 10/29/2007  DT: 10/30/2007  Job #: [id number]   cc:   Gabriel Earing, M.D.

## 2011-04-04 NOTE — Discharge Summary (Signed)
NAMEHILARI, [name]               ACCOUNT NO.:  [id number]   MEDICAL RECORD NO.:  [id number]          PATIENT TYPE:  INP   LOCATION:  1534                         FACILITY:  [facility]   PHYSICIAN:  Kela Millin, M.D.DATE OF BIRTH:  Jul 14, 1941   DATE OF ADMISSION:  06/25/2008  DATE OF DISCHARGE:  06/30/2008                               DISCHARGE SUMMARY   DISCHARGE DIAGNOSES:  1. Recurrence of sigmoid diverticulitis.  2. Cystitis.  3. Chronic obstructive pulmonary disease, mild.  4. Diabetes mellitus.  5. History of obstructive sleep apnea.  6. History of glaucoma.  7. Hypertension.  8. 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  9. History of fibromyalgia.  10.History of urinary incontinence.  11.History of stricture of the colon.  12.History of kidney stones.  13.History of glaucoma.   PROCEDURES AND STUDIES:  1. CT scan of abdomen and pelvis - sigmoid diverticulitis.  Acute      changes are noted including a small amount of free fluid in the      pelvis and a fluid-like reaction in the right ileocolic region plus      likely a reactive node accounting for the oval soft tissue      structure.  2. CT-guided aspiration of the right lower quadrant peritoneal fluid      collection on June 29, 2008, by interventional radiology.  3. Pulmonary function test on June 26, 2008 - there is a mild      obstructive lung defect.  The diffusion capacity within normal      limits.   CONSULTATIONS:  1. General Surgery, Dr. Michaell Cowing.  2. Pulmonology, Dr. Sherene Sires.   BRIEF HISTORY:  The patient is a 70 year old white female with the above-  listed medical problems who presented with complaints of abdominal pain,  fevers, chills for about 1 week.  She went to see her primary care  physician and she was started on Cipro and Flagyl, she was instructed to  come to the ER if her symptoms persisted.  So, because she continued to  have these  symptoms she came to the ER.  CT scan of the abdomen and  pelvis was done in the ER and the results as stated above.  See the full  admission history and physical dictated on June 25, 2008, by Dr.  Adela Glimpse, for the details of the patient's physical exam as well as the  laboratory data.   HOSPITAL COURSE:  1. Recurrent sigmoid diverticulitis - upon admission, the patient was      started on IV Cipro and Flagyl and kept n.p.o.  Surgery was      consulted and Dr. Michaell Cowing saw the patient.  She was started on a      clear liquid diet and her symptoms improved with conservative      management.  Dr. Michaell Cowing indicated that the patient would benefit      from segmental colonic resection given repeated episodes.  [facility] was consulted for preop evaluation.  Dr. Michaell Cowing also  indicated that if her operative risk was prohibitive he would keep      her on IV antibiotics and try to cool her down and then keep her on      oral suppressive antibiotics for 4-6 weeks and see if that would      keep them from coming back.  [facility] saw the patient,      pulmonary function tests were done and Dr. Sherene Sires came by and      indicated that the patient's COPD is only mild and that her      obstructive sleep apnea was not symptomatic.  The patient continued      to improve on conservative management and she really did not want      to have the surgery done that hospital stay and so Dr. Michaell Cowing      discharged the patient on oral antibiotics and she was to follow up      with him outpatient for scheduling of her surgery.  2. Right lower quadrant fluid collection/? abscess - per CT scan as      above, surgery reviewed the CT scan and consulted interventional      radiology for aspiration of this fluid and it was done on June 29, 2008.  Following the aspiration, Dr. Michaell Cowing indicated that the      patient could be discharged home from his standpoint.  3. Cystitis - the patient's  urinalysis on admission was consistent      with a UTI.  Urine cultures were done but there was insignificant      growth in the urine.  The patient was maintained on IV antibiotics      for #1 and she remained afebrile with no leukocytosis.  4. Diabetes mellitus - her Accu-Cheks were monitored and she was      covered with sliding scale insulin while in the hospital.  She was      to continue her outpatient medications upon discharge.  5. Chronic obstructive pulmonary disease - as discussed above, mild      per pulmonary function test.  Patient remained stable throughout      her hospital stay.   DISCHARGE MEDICATIONS:  1. Cipro 500 p.o. b.i.d.  2. Flagyl t.i.d.  3. Patient to continue Allegra 180 mg p.r.n.  4. Detrol 4 mg daily.  5. Glipizide 5 mg daily.  6. Lyrica 75 mg b.i.d.  7. Metformin 500 mg b.i.d.  8. Mucinex.  9. Oxycodone/acetaminophen.  10.Zetia 10 mg.   DISCHARGE CONDITION:  Improved/stable.   FOLLOW-UP CARE:  1. Dr. Michaell Cowing in 2 weeks.  2. Dr. Laurann Montana in 1-2 weeks.      Kela Millin, M.D.  Electronically Signed     ACV/MEDQ  D:  07/30/2008  T:  07/30/2008  Job:  [id number]   cc:   Ardeth Sportsman, MD  [address] [place] [address]  [place] [facility] [place]. Cliffton Asters, M.D.  Fax: [phone]

## 2011-04-04 NOTE — H&P (Signed)
[name], BOURN NO.:  [id number]   MEDICAL RECORD NO.:  [id number]          PATIENT TYPE:  INP   LOCATION:  1829                         FACILITY:  [facility]   PHYSICIAN:  Kalman Shan, MD   DATE OF BIRTH:  25-Jul-1941   DATE OF ADMISSION:  12/10/2007  DATE OF DISCHARGE:                              HISTORY & PHYSICAL   CHIEF COMPLAINT:  Respiratory arrest.   HISTORY OF PRESENT ILLNESS:  Patient is a 70 year old female with a past  medical history of hypertension, hyperlipidemia, diabetes and chronic  cough presenting to the emergency room after EMS found her having  respiratory difficulties on her front porch.  The patient had been seen  in the emergency room two days prior to admission and was diagnosed with  a right sided kidney stone and had been having significant pain the  morning of admission.  Earlier today, she had called her friend and was  complaining of some respiratory distress and pain from her kidney stone  and asked her friend to come see her.  When her friend arrived, EMS was  there resuscitating the patient and attempting intubation.  The patient  was pulseless at the scene and per the EMS records, CPR was started with  return of pulse very quickly.  The patient was subsequently intubated in  the emergency room. Unclear if patient had actually called EMS or if  they were driving by coincidentally and found her in tripod position  with dyspnea.   HOME MEDICATIONS:  1. Aleve p.r.n.  2. Allegra p.r.n.  3. Detrol 4 mg daily.  4. Glipizide 10 mg daily.  5. Lyrica 75 mg b.i.d.  6. Metformin 500 mg b.i.d.  7. Mucinex p.r.n.  8. Percocet 5/325 q.4h. p.r.n.  9. Quinapril 40 mg daily.  10.Zetia daily.   ALLERGIES:  PENICILLIN, ERYTHROMYCIN and DARVON.   PAST MEDICAL HISTORY:  1. Diabetes mellitus type 2.  2. Hypertension.  3. Hyperlipidemia.  4. Diverticulosis.  5. Questionable COPD.  6. Small-bowel obstruction from a sigmoid  stricture. - seen by GI in      past and told not a surgical candidate due to sleep apnea  7. Recent nephrolithiasis.  8. Sleep apnea per history  9. Obesity   PAST SURGICAL HISTORY:  Bilateral tubal ligation.   SOCIAL HISTORY:  Patient is a 70 year old female who takes care of her  own ADLs and has a very supportive family.  The patient has a strong  smoking history with over a pack a day for 40+ years and recently quit  two years ago.  The family denies any alcohol or illegal drugs.   FAMILY HISTORY:  Noncontributory.   PHYSICAL EXAMINATION:  VITAL SIGNS:  Temperature 97.8, heart rate 144,  blood pressure 203/103, respiratory rate 12, O2 saturation 95% on 100%  FIO2.  GENERAL APPEARANCE:  The patient is sedated and intubated.  HEENT:  Pupils sluggish but equally round.  Moist mucous membranes.  NECK:  Supple.  No lymphadenopathy.  PULMONARY:  Bilateral scattered rhonchi with decreased air movement  bilaterally.  CARDIOVASCULAR:  Tachycardic, heart sounds  distant, no murmurs.  ABDOMEN:  Distended abdomen with hypoactive bowel sounds, soft,  tympanic, no palpable masses.  EXTREMITIES:  No edema.  Dorsalis pedis pulses and radial pulses 2+.  NEUROLOGIC:  Patient is sedated and unresponsive at present.   ADMISSION LABORATORY DATA:  BMET with sodium 137, potassium 4.6,  chloride 106, bicarb 25, BUN 21, creatinine 0.8.  Cardiac markers with  myoglobin 220, troponin less than 0.05 and CK-MB at 1.  Urinalysis  within normal limits except for turbid in appearance, large blood and  moderate leukocytes.  Microscopic exam with many squamous epithelials, 3-  6 wbc and 7-10 rbc with few bacteria.  CBC with white blood cell count  21.2, absolute neutrophil count of 11.7, hemoglobin 15.4, MCV 94.3 and  platelet count 340.  Patient is Gastroccult positive.  BNP 262.  ABG  with pH 7.09, pCO2 96, pO2 134, bicarb 30.   DIAGNOSTIC IMAGING:  Chest x-ray with the ET tube appropriately located,   right greater than left interstitial fluid, likely edema but infection  not excluded.  Left basilar disease, questionable scarring versus  atelectasis versus aspiration or infection.  Abdominal film with no  acute or specific abdominal findings.   ASSESSMENT/PLAN:  1. Cardiac arrest with Return of Spontaneous Circulation: Patient has      been hemodynamically stable and it is unclear if she lost pulse or      not, but cardiopulmonary resuscitation was given in the field with      quick restoration of pulse.  2. Acute Respiratory Acidosis.  The patient is intubated and CCM is      managing the vent.  The etiology of her respiratory failure is      unclear at this time. Possible pneumonia given copd and winter      season. However,  B natriuretic peptide is elevated and we will      check 2-D echo and diurese as tolerated.  Will also start treatment      empirically with antibiotics to include vancomycin, Cipro and      Flagyl given her multiple medication allergies.  Will also cycle      cardiac enzymes.  3. Pulmonary edema.  Patient appears to have edema on her chest x-ray      and we will check a 2-D echo, cycle cardiac enzymes and diurese as      blood pressure tolerates.  4. Gastrointestinal bleed.  Nasogastric tube was placed for      decompression and small amount of dark blood was seen coming out of      the nasogastric tube and she is Gastroccult positive.  We will      start on high dose proton pump inhibitor therapy and follow      hemoglobin very closely.  May need to call gastroenterology if the      patient has a significant drop in hemoglobin.  5 Neuro Status: Unresponsive. Will have to monitor this closely. AT time  of our eval she was <2 after pavulon admin at 11am. Given the fact it  was witnessed cpr arrest and resus by EMS, suspect she should wake up   STAFF NOTE:  Personally evaluated patient. Spent 45 critical care minutes in history  elictation from resident,  echart, er doc all at bedside and radiology  review. She had witnessed cardioresp arrest following some days of flank  pain. ROSC +. Currently unresponsive bout could be pavulon effct. Eval  shows significant wbc elevation, pulm  edema (BNP high) or pneumonia and  abnormal UA. Intubated. BP okay. Admite to ICU. Treat for PNA and CHF.  Cycle cardiac enzymes. Will get CT abdomen and abdominal labs  Agree with Dr. Milton Ferguson Wilson's notes.      Joaquin Courts, MD  Electronically Signed      Kalman Shan, MD  Electronically Signed    VW/MEDQ  D:  12/10/2007  T:  12/10/2007  Job:  [id number]

## 2011-04-04 NOTE — Consult Note (Signed)
NAMETEANNA, [name]               ACCOUNT NO.:  [id number]   MEDICAL RECORD NO.:  [id number]          PATIENT TYPE:  INP   LOCATION:  1534                         FACILITY:  [facility]   PHYSICIAN:  Ardeth Sportsman, MD     DATE OF BIRTH:  1941-04-21   DATE OF CONSULTATION:  06/25/2008  DATE OF DISCHARGE:                                 CONSULTATION   PRIMARY CARE PHYSICIAN:  This is either Dr. Sharee Pimple of [facility] or  Dr. Stacie Acres. White.   PULMONOLOGIST:  [facility].   GASTROENTEROLOGIST:  Hedwig Morton. Juanda Chance, M.D., with [facility].   REQUESTING PHYSICIAN:  Kela Millin, M.D., with the hospitalists.   REASON FOR CONSULT:  Recurrent diverticulitis with numerous medical and  pulmonary issues.   HISTORY OF PRESENT ILLNESS:  Ms. Pesola is a 70 year old morbidly obese  female with a long tobacco history and COPD, diabetes, and sleep apnea.  She has had repeated episodes of diverticulitis.  She knows that she had  one back in August.  She has documentation of repeated attacks back in  October 2008, January 2009, and now again.  She has been evaluated by  Dr. Juanda Chance, who did a scope that showed a sigmoid stricture but no  evidence of any tumor or mass.  There was discussion about whether or  not she would benefit from segmental colonic resection, but the concern  was given her medical issues that she would be a poor operative  candidate and to not try and operate on her.   However, the patient had a recurrent episode of pain.  She was started  on oral antibiotics and did not improve.  Therefore, she came to the  emergency room, was admitted yesterday.  She had a CT scan done last  night, which showed evidence of recurrent diverticulitis but now with a  phlegmon.  Therefore surgical consultation was requested to see if she  would benefit from segmental resection.   The patient can walk maybe a couple of blocks before she gets tired.  She does not have any  history of any cardiac or pulmonary issues that  she can recall.  She has had constipation, but she is on MiraLax and  seems to have a bowel movement about every day to every other day. She  denies any fevers, chills or sweats.  She denies as much pain now  although she does have soreness and discomfort.   PAST MEDICAL HISTORY:  1. COPD.  She denies oxygen requiring at this time.  2. Acute respiratory distress, requiring intubation back in January      2009.  It was thought to possibly be related to a reaction to      captopril.  3. Diabetes.  4. History of recurrent kidney and ureteral stones.  5. Recurrent diverticulitis with colonic stricture.  6. Rectal polyp, adenomatous polyp, status post removal.  7. Urinary incontinence.  8. Question of fibromyalgia.   PAST SURGICAL HISTORY:  She has had bilateral tubal ligation but no  other abdominal surgeries.   ALLERGIES:  __________, DARVON, ERYTHROMYCIN, PENICILLIN,  AND I BELIEVE  CAPTOPRIL.   FAMILY HISTORY:  She cannot recall any major cardiopulmonary issues.   SOCIAL HISTORY:  She probably has about an 80-pack-year history of  tobacco but quit about 3 years ago.  Denies any tobacco, alcohol or  other drug use.  She is currently married.   REVIEW OF SYSTEMS:  As noted per history of present illness.  Note  constitutional is negative.  Eyes:  She wears glasses but otherwise  negative.  ENT:  Negative.  CARDIAC:  No exertional chest pain or  history of angina or diaphoresis.  GASTROINTESTINAL:  Denies any  significant heartburn or reflux that I can recall.  MUSCULOSKELETAL:  She does have chronic pain and thinks she may have fibromyalgia.  ENDOCRINE:  She is on oral hypoglycemics for her diabetes and seems to  be well-controlled.  ALLERGIC:  She does have seasonal allergies.  PULMONARY:  As noted per HPI.  No recent hemoptysis.  No recent cold,  cough or flu.  RENAL:  She has a history of kidney stones, status post  extraction  done earlier this year, but otherwise is negative for any  recent episodes.  No hematuria and no recent stones.  GYNECOLOGIC:  Status post tubal ligation, otherwise is negative.  BREAST:  She denies  any issues.  PSYCHIATRIC:  Negative.   PHYSICAL EXAMINATION:  VITAL SIGNS:  Her current temperature is 97.3,  respiration 16, pulse 67.  She has 98% sats on room air.  Blood pressure  131/67.  GENERAL:  She is a well-developed, well-nourished, morbidly obese female  sitting in a chair in no acute distress.  PSYCHIATRIC:  She is pleasant, interactive.  She tends to be a little  defensive and makes some excuses, but no evidence of any dementia,  delirium, psychosis or paranoia.  She has a pretty good wit about her.  NEUROLOGIC:  Cranial nerves II-XII are intact.  Hand grips 5/5, equal  and symmetrical.  No resting or intention tremors.  HEENT:  Eyes:  Pupils are equal, round and reactive to light.  Extraocular movements are intact.  Sclerae are not icteric or injected.  She is normocephalic with no facial asymmetry.  Mucous membranes are  dry.  The nasopharynx and oropharynx clear.  HEART:  Regular rate and rhythm.  No murmurs, gallops or rubs.  CHEST:  She has somewhat of a barrel chest and some mild tenderness to  rib palpation but no click or obvious step-off.  No pain to sternal  palpation.  Her lungs have some distant breath sounds but no evidence of  any wheezes, rales or rhonchi.  ABDOMEN:  Morbidly obese but soft.  Her umbilicus is flattened out but I  cannot tell any obvious periumbilical or incisional hernias.  She is  touchy in her suprapubic region with a little bit of irritation but no  definite guarding and no strong peritonitis at this point.  Certainly  she has no diffuse peritonitis.  GENITAL:  Normal external female genitalia.  RECTAL:  Deferred per patient request and given recent colonoscopy done  earlier this year.  MUSCULOSKELETAL:  She has pretty good range of motion  of her shoulders,  elbows, wrists as well as hips, knees and ankles.  EXTREMITIES:  She has about 1+ pitting edema, right greater than left  side, but no clubbing or cyanosis to my examination.  BREAST:  Deferred.  LYMPH:  No head, neck,, axillary, groin lymphadenopathy.   LABORATORY VALUES:  Her white count is  7.4.  I do not have a  differential.  Hemoglobin is 12.5.  Her coags are normal.  Her  comprehensive metabolic panel is in the normal range with no elevated  liver function tests.  Her albumin is 2.9.   CT scan with findings as noted above showing a phlegmon in her mid to  distal sigmoid with some fluid between her uterus and bladder but no  definite abscess.  No evidence of bowel obstruction.  She has a very  weak, thin abdominal wall.   ASSESSMENT/PLAN:  Morbidly obese female with pulmonary issues and sleep  apnea and moderate-to-severe operative risk at best, now with recurrent  episode of diverticulitis.   The anatomy & physiology of the digestive tract was explained.  Pathophysiology of diverticulosis with recurrent bout of diverticulitis  were discussed.  I think the patient would benefit from segmental  colonic resection given repeated episodes and this is certainly  indicated.  I probably would try and do this laparoscopically assisted  and minimize incision, decrease her operative risk of cardiopulmonary  stress, increased risk of incisional hernia and infection; however, that  would prolong the operative period.   I called [facility] to get a sense of what her pulmonary function  is as it does not seem as bad as initially billed.  She has had I guess  a sleep study in the past and did not tolerate or want to have CPAP.  I  stressed to her the importance of trying to optimize her sleep apnea to  decrease operative risk as well.   If her operative risk is prohibitive, then I would keep her on IV  antibiotics and try and cool her down and keep her on oral  suppressive  antibiotics for 4 to 6 weeks and see if that will help prevent them from  coming back.  I do worry that she is at increased risk with stricture  formation already coming of having a perforation or other further  problems down the line.  Otherwise, if she is a reasonable operative  risk would ideally cool her down with IV antibiotics, switch over to  oral antibiotics for another 4 to 6 weeks and then plan elective  resection.  The patient understands there is a risk of needing a  permanent colostomy, but I tried to minimize that since she is at high  risk of having a parastomal hernia.      Ardeth Sportsman, MD  Electronically Signed     SCG/MEDQ  D:  06/25/2008  T:  06/25/2008  Job:  [id number]

## 2011-04-04 NOTE — Discharge Summary (Signed)
[name], [name]:  [id number]   MEDICAL RECORD NO.:  [id number]          PATIENT TYPE:  INP   LOCATION:  1504                         FACILITY:  [facility]   PHYSICIAN:  Lonia Blood, M.D.DATE OF BIRTH:  [birth year]   DATE OF ADMISSION:  09/04/2007  DATE OF DISCHARGE:                               DISCHARGE SUMMARY   PRIMARY CARE PHYSICIAN:  Dr. Gabriel Earing, at [facility] on [facility].   DISCHARGE DIAGNOSES:  1. Acute sigmoid diverticulitis.      a.     Diagnosed by CT scan with no evidence of abscess or       complication.      b.     Counseled on diverticulosis and appropriate diet.      c.     Responding well to IV antibiotics.  2. Fullness of the ileocecal valve on CT scan.      a.     Possibly simply secondary to incomplete distention of colon.      b.     Outpatient colonoscopy recommended for full evaluation.  3. Chronic obstructive pulmonary disease.  4. Diabetes mellitus.  5. Hypertension.  6. Dyslipidemia.  7. Status post bilateral tubal ligation.  8. Glaucoma.  9. Urinary tract infection.   DISCHARGE MEDICATIONS:  1. Metformin 1000 mg b.i.d.  2. Glipizide ER 10 mg daily.  3. Lyrica 75 mg b.i.d.  4. Zetia 10 mg daily.  5. Detrol LA 4 mg daily.  6. Allegra 180 mg p.r.n.  7. Oxycodone/acetaminophen b.i.d. p.r.n.  8. Albuterol MDI two puffs p.r.n.  9. Accupril 40 mg daily.  10.Advair 50/250, one puff b.i.d.  11.Aleve p.r.n.  12.Betimol 0.5% solution, one drop in right eye b.i.d.  13.Travatan one drop both eyes nightly.  14.Ciprofloxacin 500 mg b.i.d. for 7 days then stop.  15.Flagyl 500 mg p.o. t.i.d. for 7 days then stop.   ALLERGIES:  1. DARVON.  2. PENICILLIN.  3. SULFA.  4. TALWIN.   FOLLOWUP:  Patient is instructed to follow up with Dr. Andi Devon, at  [facility], in approximately 5 days.  At that time, consideration should  be given to setting the patient up for an outpatient colonoscopy to  evaluate the extent of  her diverticular disease and also to directly  inspect the ileocecal area that appeared full on her CT scan.   CONSULTATIONS:  None.   PROCEDURES:  A CT scan of the abdomen on September 04, 2007 - Evidence of  diverticulitis without abscess or other complicating factors in the  sigmoid region of the colon.  Fullness of the ileocecal valve also noted  that could simply be secondary to non-distention of this area of the  colon at the time of evaluation.  Outpatient followup is recommended  with colonoscopy.   HOSPITAL COURSE:  [name] is a very pleasant 70 year old  female with a history as noted above.  She was admitted to the hospital  on September 04, 2007, due to complaints of abdominal pain and generalized  discomfort.  A CT scan of the abdomen and pelvis was  carried out which  revealed a sigmoid diverticulitis.  The patient had been previously  treated for this in August in the outpatient arena through the [facility] Emergency Room.  The patient was placed in the acute units.  She  was placed on IV ciprofloxacin and Flagyl.  She tolerated both of these  antibiotics well.  There was some initial diarrhea that significantly  decreased in its frequency and severity.  Her diet was able to be  advanced and she tolerated well.  The patient was counseled as to a  diverticular-appropriate diet.  She was counseled as to the need to  increase the fiber in her diet and given examples of different types of  fiber.  At the time of her discharge she is afebrile, vital signs are  stable and she is tolerating a regular diet without difficulty.   It should be noted that at the time of the patient's CT scan of the  abdomen the ileocecal valve was noted to be somewhat thickened or full  in appearance.  This area of the colon was not well dilated at the time  of the exam.  This alone could explain these findings.  Nonetheless,  significant pathology in this region could not be ruled out.   Given the  combined indication of diverticular disease, it is highly recommended  that outpatient colonoscopy be carried out.  Given the patient's acute  diverticulitis, colonoscopy would not be appropriate at the present  time.  I have counseled the patient as to these findings and the need to  re-evaluate the colon at a more appropriate time.   At the time of presentation, the patient's urinalysis revealed evidence  of a significant pyelonephritis/urinary tract infection.  Urine culture  was obtained but failed to reveal the offending pathogen.  Patient did  have concomitant dysuria to further support this diagnosis.  With IV  Cipro therapy, her symptoms had resolved prior to her discharge.   Fortunately, the patient's multiple other chronic medical problems were  stable at the time of her discharge.  She is discharged on September 08, 2007, to return to her primary care physician with followup  recommendations as noted above.      Lonia Blood, M.D.  Electronically Signed     JTM/MEDQ  D:  09/08/2007  T:  09/08/2007  Job:  [id number]   cc:   Gabriel Earing, M.D.  Fax: [phone]

## 2011-04-04 NOTE — Assessment & Plan Note (Signed)
[facility]                         GASTROENTEROLOGY OFFICE NOTE   NAME:[name], [name]                      MRN:          [id number]  DATE:03/09/2008                            DOB:          1941-03-04    PROBLEM:  Recurrent diverticulitis.   HISTORY:  [name] is a 70 year old white female known to Dr. Lina Sar  who has had problems with recurrent diverticulitis and has known severe  diverticulosis of the sigmoid colon noted on most recent colonoscopy in  November of 2008.  At that time, she actually was felt to have a partial  obstruction of the sigmoid colon which was very difficult to negotiate  even with the pediatric scope.  At the time of last office evaluation in  December of 2008, Dr. Juanda Chance had discussed sigmoid resection with the  patient due to her recurrent episodes and chronic partial obstruction.  It was felt that she was at significant increase risk for general  anesthesia because she has probable sleep apnea and therefore has since  been treated medically.   The patient says she is currently undergoing a workup for sleep apnea.  The study is to be done on March 18, 2008.  She has had another episode  of diverticulitis in March of 2009 which was treated with at least 2  weeks worth of Cipro and Flagyl.  She can tolerate the Flagyl but says  that it does make her nauseated.  After completing that course of  antibiotics, she says she is not having any current discomfort.  She has  been drinking MiraLax on a daily basis and has been having normal bowel  movements.  She avoids all the obvious culprits for diverticular  disease.  Her appetite has been fine.  She has not noticed any fevers or  chills over the past week.  She says she does feel like she gets  blocked whenever the diverticulitis flares with abdominal distention  and discomfort.   MOST RECENT LABORATORIES:  Done February 25, 2008, showed a WBC of 11.2,  hemoglobin 12.1,  hematocrit of 38.8.   CURRENT MEDICATIONS:  1. Metformin 1000 mg b.i.d.  2. Glipizide ER 10 mg q. day.  3. Lyrica 75 b.i.d.  4. Zetia 10 mg q. day.  5. Betimol eye drops 0.5% 1 drop right eye b.i.d.  6. Travatan eye drops both eyes q.h.s.  7. Detrol LA 4 mg daily.  8. Currently completing a course of Cipro and Flagyl.   ALLERGIES AND INTOLERANCES:  1. TALWIN.  2. PENICILLIN.  3. ACCUPRIL.  4. SULFA causes itching.  5. MYCIN causes chest discomfort.   PHYSICAL EXAMINATION:  Well-developed white female in no acute distress.  Weight is 170.  Blood pressure 122/82.  Pulse is 76.  CARDIOVASCULAR:  Regular rate and rhythm with S1 and S2.  No murmur, rub  or gallop.  ABDOMEN:  Soft.  Bowel sounds are active.  She is basically nontender.  Abdomen is obese.  There is no palpable mass or hepatosplenomegaly.   IMPRESSION:  45. A 70 year old white female with recurrent diverticulitis and  underlying chronic partial obstruction secondary to stricture of      the sigmoid colon with most recent episode of diverticulitis      resolved.  2. Probable sleep apnea.   PLAN:  1. Complete 1 more week of Cipro 500 b.i.d. and Flagyl 250 q.i.d. to      assure that her current episode completely resolves.  2. Discussed surgical referral again.  She would like to pursue this      in June after getting results of her sleep study, etc.  We will set      her up with one of the surgeons for consultation sometime within      the next 6 weeks.      Mike Gip, PA-C  Electronically Signed      Wilhemina Bonito. Marina Goodell, MD  Electronically Signed   AE/MedQ  DD: 03/16/2008  DT: 03/16/2008  Job #: [id number]   cc:   Hedwig Morton. Juanda Chance, MD

## 2011-04-06 ENCOUNTER — Ambulatory Visit (INDEPENDENT_AMBULATORY_CARE_PROVIDER_SITE_OTHER): Payer: Medicare Other | Admitting: Cardiovascular Disease

## 2011-04-06 ENCOUNTER — Encounter: Payer: Self-pay | Admitting: Cardiovascular Disease

## 2011-04-06 VITALS — BP 104/70 | HR 92 | Resp 18 | Ht 64.0 in | Wt 177.8 lb

## 2011-04-06 DIAGNOSIS — I2510 Atherosclerotic heart disease of native coronary artery without angina pectoris: Secondary | ICD-10-CM

## 2011-04-06 NOTE — Patient Instructions (Signed)
Your physician recommends that you schedule a follow-up appointment in: 6 months  Lipids in 6 months.

## 2011-04-06 NOTE — Progress Notes (Signed)
History of Present Illness:70 yo WF with history of DM, HTN, Hyperlipidemia, tobacco abuse and CAD admitted to [facility] on 12/28/10 with an acute inferior STEMI. Emergent cath on 12/29/10 with serial lesions in subtotally occluded RCA. I was unable to stent the vessel during the index procedure because of severe calcification in the mid vessel. I established flow with balloon inflations and treated her with Integrilin,  Effient and ASA and staged the PCI for 12/31/10 at which time a rotablator atherectomy of the mid RCA was performed. She did well with both procedures. Two overlapping drug eluting stents were placed in the mid RCA. EF was found to be 45 % at the time of the cath. There was mild disease in the LAD and the Circumflex arteries.   She is here today for follow up. She is doing well. NO chest pain. Mild dyspnea, baseline. Overall feels well.   Primary care is Laurann Montana at [facility] on [address].   Past Medical History  Diagnosis Date  . CAD (coronary artery disease)     s/p inferior STEMI 12/29/10 with rotablator atherectomy RCA 12/31/10 and DES x 2 RCA  . Ischemic cardiomyopathy     EF 45% by cath 12/29/10.   . Diabetes mellitus     type 2  . HTN (hypertension)   . Hyperlipidemia   . Diverticulosis   . Asthma   . COPD (chronic obstructive pulmonary disease)   . Small bowel obstruction     from a sigmoid stricture  . Nephrolithiasis   . Sleep apnea   . Overweight   . Osteoarthritis   . GERD (gastroesophageal reflux disease)   . Anxiety   . Glaucoma   . History of colonoscopy     Past Surgical History  Procedure Date  . Bilateral tubal ligation   . Lithotripsy   . 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 Cardiac catheterization 12/29/2010    Current Outpatient Prescriptions  Medication Sig Dispense Refill  . albuterol (PROVENTIL) (2.5 MG/3ML) 0.083% nebulizer solution Take 2.5 mg by nebulization every 6 (six) hours as  needed.        Marland Kitchen albuterol (PROVENTIL,VENTOLIN) 90 MCG/ACT inhaler Inhale 2 puffs into the lungs 2 (two) times daily.        Marland Kitchen amLODipine (NORVASC) 2.5 MG tablet Take 2.5 mg by mouth daily.        Marland Kitchen aspirin 81 MG tablet Take 81 mg by mouth daily.        . cyclobenzaprine (FLEXERIL) 10 MG tablet 1/2 to 1 tablet 2-3 times daily as needed for muscle spasms       . ezetimibe (ZETIA) 10 MG tablet Take 10 mg by mouth daily.        . Fluticasone-Salmeterol (ADVAIR DISKUS) 250-50 MCG/DOSE AEPB Inhale 1 puff into the lungs 2 (two) times daily.        Marland Kitchen glipiZIDE (GLUCOTROL) 5 MG tablet Take 5 mg by mouth daily.        . metFORMIN (GLUMETZA) 500 MG (MOD) 24 hr tablet Take 500 mg by mouth 2 (two) times daily.        . metoprolol tartrate (LOPRESSOR) 25 MG tablet Take 25 mg by mouth 2 (two) times daily.        . nitroGLYCERIN (NITROSTAT) 0.4 MG SL tablet Place 0.4 mg under the tongue every 5 (five) minutes as needed.        Marland Kitchen oxyCODONE-acetaminophen (PERCOCET) 5-325 MG per tablet  Two times a day as needed       . polyethylene glycol powder (MIRALAX) powder Take 17 g by mouth daily.        . prasugrel (EFFIENT) 10 MG TABS Take by mouth daily.        . pregabalin (LYRICA) 75 MG capsule Take 75 mg by mouth 2 (two) times daily.        . rosuvastatin (CRESTOR) 40 MG tablet Take 40 mg by mouth daily.        . timolol (BETIMOL) 0.5 % ophthalmic solution Place 1 drop into the right eye 2 (two) times daily.        . tizanidine (ZANAFLEX) 2 MG capsule 1-2 every 6 hours       . tolterodine (DETROL LA) 4 MG 24 hr capsule Take 4 mg by mouth daily.        . travoprost, benzalkonium, (TRAVATAN) 0.004 % ophthalmic solution Place 1 drop into both eyes at bedtime.          Allergies  Allergen Reactions  . Erythromycin   . Lisinopril     REACTION: respiratory arrest jan 2009  . Penicillins   . Pentazocine Lactate   . Propoxyphene Hcl   . Quinapril Hcl   . Sulfonamide Derivatives     History   Social History  .  Marital Status: Married    Spouse Name: N/A    Number of Children: N/A  . Years of Education: N/A   Occupational History  . Retired    Social History Main Topics  . Smoking status: Former Games developer  . Smokeless tobacco: Not on file   Comment: over a ppd for 40+ years; quit 10/2004  . Alcohol Use: No  . Drug Use: No  . Sexually Active: Not on file   Other Topics Concern  . Not on file   Social History Narrative  . No narrative on file    Family History  Problem Relation Age of Onset  . Breast cancer Maternal Grandmother   . Diabetes Father   . Diabetes Sister   . Heart disease Father   . Colon cancer Neg Hx     Review of Systems:  As stated in the HPI and otherwise negative.   BP 104/70  Pulse 92  Resp 18  Ht 5\' 4"  (1.626 m)  Wt 177 lb 12.8 oz (80.65 kg)  BMI 30.52 kg/m2  Physical Examination: General: Well developed, well nourished, NAD HEENT: OP clear, mucus membranes moist SKIN: warm, dry. No rashes. Neuro: No focal deficits Musculoskeletal: Muscle strength 5/5 all ext Psychiatric: Mood and affect normal Neck: No JVD, no carotid bruits, no thyromegaly, no lymphadenopathy. Lungs:Clear bilaterally, no wheezes, rhonci, crackles Cardiovascular: Regular rate and rhythm. No murmurs, gallops or rubs. Abdomen:Soft. Bowel sounds present. Non-tender.  Extremities: No lower extremity edema. Pulses are 2 + in the bilateral DP/PT.  Echo 04/03/11:  Left ventricle: The cavity size was normal. Wall thickness was normal. Systolic function was normal. The estimated ejection fraction was in the range of 55% to 60%. Doppler parameters are consistent with abnormal left ventricular relaxation (grade 1 diastolic dysfunction). - Mitral valve: Mild regurgitation. - Left atrium: The atrium was mildly dilated.  EKG: NSR, rate 92 bpm.

## 2011-04-06 NOTE — Assessment & Plan Note (Signed)
Stable. Continue dual antiplatelet therapy with ASA 81 mg and Effient 10 mg once daily for at least one year. Continue beta blocker and statin.

## 2011-04-07 NOTE — Discharge Summary (Signed)
[name], [name]               ACCOUNT NO.:  [id number]   MEDICAL RECORD NO.:  [id number]          PATIENT TYPE:  INP   LOCATION:  0476                         FACILITY:  [facility]   PHYSICIAN:  Danae Chen, M.D.DATE OF BIRTH:  20-Mar-1941   DATE OF ADMISSION:  11/14/2004  DATE OF DISCHARGE:                                 DISCHARGE SUMMARY   DISCHARGE DIAGNOSES:  1.  Chronic obstructive pulmonary disease exacerbation with acute      bronchitis.  2.  Underlying chronic obstructive pulmonary disease.  3.  Type 2 diabetes.  4.  Hypertension.  5.  Dyslipidemia.   DISCHARGE MEDICATIONS:  The patient is to resume her home medications at her  prior doses, including Lipitor, glipizide, metformin, Accupril, Detrol, and  Advair.   New medications:  1.  Avelox 400 mg one p.o. daily x3 days to complete 7-day course.  2.  Albuterol measured dose inhaler two puffs q.i.d. p.r.n. for shortness of      breath.  3.  Mucinex 600 mg tablets two tablets twice daily for a week for      decongestant and help with thinning of the mucus.   FOLLOW-UP:  The patient is to call Dr. Lanell Matar office - whom her mother  sees - to call for a follow-up appointment in 1-2 weeks.  She also regularly  uses [facility] as her primary caregivers as well.   BRIEF HOSPITAL COURSE:  The patient is a 70 year old female who was admitted  with shortness of breath, cough, and low-grade fever.  She had been seen by  [facility] physicians, was found to have low oxygen levels, and was referred  for evaluation and found to have COPD exacerbation with the bronchitis.  She  received 1 day of IV antibiotics and then was switched to oral antibiotics  and defervesced and did well.  She also received IV Solu-Medrol with  improvement in her symptoms as well.  The patient's blood sugars were on the  high side while on the steroids, but resolving with discontinuation of the  steroids.  At the time of discharge, her condition is  improved.   PHYSICAL EXAMINATION:  LUNGS:  Her lungs are cleared.  She has no wheezing,  no rales.  HEART:  Heart rate is regular.  ABDOMEN:  Soft.  EXTREMITIES:  She has no peripheral edema.  VITAL SIGNS:  Temperature is 97, blood pressure is 139/69, CBG 284,  respirations 14, O2 saturation is 97% on room air.   PERTINENT DISCHARGE LABORATORIES:  BUN of 9, creatinine 0.8.   There are no interventions, no consultations.  The patient is advised to not  smoke and be compliant with her medications.     Rame   RLK/MEDQ  D:  11/17/2004  T:  11/17/2004  Job:  [id number]

## 2011-04-07 NOTE — H&P (Signed)
[name], [name]               ACCOUNT NO.:  [id number]   MEDICAL RECORD NO.:  [id number]          PATIENT TYPE:  INP   LOCATION:  0101                         FACILITY[facility]   PHYSICIAN:  Toby L. Catalina Pizza, M.D.   DATE OF BIRTH:  11-24-1940   DATE OF ADMISSION:  11/14/2004  DATE OF DISCHARGE:                                HISTORY & PHYSICAL   PRIMARY CARE PHYSICIAN:  Unassigned.   CHIEF COMPLAINT:  Shortness of breath.   HISTORY OF PRESENT ILLNESS:  [name] is a 70 year old female who obtains  her primary care at [facility].  On November 08, 2004, the patient presented  to [facility] with what appeared to be an acute exacerbation of COPD.  The  patient was started on Avelox, Nasacort, and Advair.  After about 5 days of  treatment, the patient had very little improvement.  She returned to [facility], and was found to have an O2 saturation of 87%.  She was subsequently  transferred to [facility] ED.  In the ED, the patient was treated with  nebulizers and O2 by nasal cannula.  Throughout her stay in the ED, she  continued to desaturate to the upper 80s off of O2.  Due to this  desaturation, the ED physician felt the patient needed further evaluation.  When I spoke with the patient, she was still somewhat short of breath.  She  said, however, that she was greatly improved compared to earlier today.  She  says that over the past 2 weeks, she has had increased sputum production.  This sputum has been yellow at times.  She denies any fevers; however, she  does state that she has had chills.  The patient is a smoker.   PAST MEDICAL HISTORY:  1.  COPD.  2.  Diabetes type 2.  3.  Hypertension.  4.  Hypercholesterolemia.   PAST SURGICAL HISTORY:  Tubal.   ALLERGIES:  1.  PENICILLIN.  2.  SULFA.  3.  DARVON.  4.  PREDNISONE.  The patient states that when she takes prednisone she      feels fatigued.   MEDICATIONS:  1.  Lipitor.  2.  Glipizide.  3.  Metformin 500 p.o.  b.i.d.  4.  Accupril.  5.  Detrol.  (Note that the patient did not have a correct medication list with her.  She  says that her husband will bring a correct form tomorrow.)   SOCIAL HISTORY:  The patient is a smoker.  She says that she is going to  quit starting today.  She denies alcohol or IV drug abuse.   FAMILY HISTORY:  Father died due to coronary artery disease.  Mother is  still living and has coronary artery disease.   REVIEW OF SYSTEMS:  A complete review of systems was obtained.  The review  was negative, except for that stated in the HPI.   PHYSICAL EXAMINATION:  VITAL SIGNS:  Temperature is 97, blood pressure  165/88, pulse 84, respiratory rate 20.  HEENT:  Pupils were equally round and reactive to light.  Extraocular  muscles were intact.  There was no scleral icterus.  Oropharynx was clear  and moist.  There was no erythema or thrush.  Tympanic membranes were clear  bilaterally.  NECK:  No JVD, no carotid bruit, no adenopathy.  HEART:  Regular rate and rhythm.  No murmurs, rubs, or gallops.  LUNGS:  There were bilateral scattered wheezes and scattered rhonchi.  No  rales were noted.  ABDOMEN:  Positive bowel sounds, nontender, nondistended.  EXTREMITIES:  No edema.   LABORATORY DATA/RADIOLOGICAL STUDIES:  Chest x-ray was obtained.  The  patient has changes consistent with COPD.  There was left basilar scarring.  No other laboratories were obtained.   ASSESSMENT AND PLAN:  1.  Chronic obstructive pulmonary disease exacerbation.  I will start the      patient on levofloxacin 500 mg p.o. daily.  In addition to that, I will      add Solu-Medrol 125 mg IV q.6h.  In addition, she will receive nebulizer      treatments every 6 hours and p.r.n.  I feel that the patient would have      improved as an outpatient if she had been placed on a steroid taper.      Avelox was sufficient coverage for a chronic obstructive pulmonary      disease exacerbation.  2.  Diabetes type 2.   I will continue the patient on glipizide 5 mg one p.o.      daily and metformin 500 mg p.o. b.i.d.  I will cover her with sliding      scale insulin and check Accu-Cheks q.a.c. and q.h.s.  3.  Hypertension.  I will continue the patient on Accupril.  4.  Hypercholesterolemia.  I will continue the patient on Lipitor.  5.  The patient is a full code.   Note that I did not have available to me the patient's exact dosages.  She  will have her husband bring [place] a correct list tomorrow.      TLF/MEDQ  D:  11/15/2004  T:  11/15/2004  Job:  [id number]

## 2011-08-10 LAB — CBC
HCT: 29.4 — ABNORMAL LOW
HCT: 31.3 — ABNORMAL LOW
HCT: 34.8 — ABNORMAL LOW
HCT: 35.7 — ABNORMAL LOW
HCT: 36.8
HCT: 36.8
HCT: 41.8
Hemoglobin: 10.1 — ABNORMAL LOW
Hemoglobin: 10.8 — ABNORMAL LOW
Hemoglobin: 11.8 — ABNORMAL LOW
Hemoglobin: 12.7
Hemoglobin: 12.8
Hemoglobin: 14.3
MCHC: 33.8
MCV: 92.6
MCV: 93.1
MCV: 93.8
Platelets: 222
Platelets: 229
Platelets: 235
RBC: 3.19 — ABNORMAL LOW
RBC: 3.39 — ABNORMAL LOW
RBC: 3.74 — ABNORMAL LOW
RBC: 4.03
RBC: 4.57
RDW: 13.2
RDW: 14.2
WBC: 10.7 — ABNORMAL HIGH
WBC: 11.4 — ABNORMAL HIGH
WBC: 12 — ABNORMAL HIGH
WBC: 12.1 — ABNORMAL HIGH
WBC: 8.6
WBC: 9.6
WBC: 9.8

## 2011-08-10 LAB — BLOOD GAS, ARTERIAL
Acid-Base Excess: 1.1
Bicarbonate: 34.3 — ABNORMAL HIGH
Drawn by: 275301
Drawn by: 283391
FIO2: 0.3
MECHVT: 500
Mode: POSITIVE
PEEP: 5
PEEP: 5
PEEP: 5
Patient temperature: 98.6
Patient temperature: 98.9
Pressure support: 10
Pressure support: 5
RATE: 14
RATE: 14
TCO2: 24.2
pCO2 arterial: 41.3
pCO2 arterial: 47.3 — ABNORMAL HIGH
pH, Arterial: 7.339 — ABNORMAL LOW
pH, Arterial: 7.474 — ABNORMAL HIGH
pH, Arterial: 7.486 — ABNORMAL HIGH
pO2, Arterial: 81.2
pO2, Arterial: 82.1

## 2011-08-10 LAB — B-NATRIURETIC PEPTIDE (CONVERTED LAB): Pro B Natriuretic peptide (BNP): 240 — ABNORMAL HIGH

## 2011-08-10 LAB — BASIC METABOLIC PANEL
BUN: 18
BUN: 39 — ABNORMAL HIGH
BUN: 43 — ABNORMAL HIGH
Calcium: 9.5
Chloride: 109
Chloride: 94 — ABNORMAL LOW
Chloride: 95 — ABNORMAL LOW
Chloride: 99
Creatinine, Ser: 0.97
Creatinine, Ser: 1.22 — ABNORMAL HIGH
GFR calc Af Amer: >60
GFR calc Af Amer: >60
GFR calc non Af Amer: 44 — ABNORMAL LOW
GFR calc non Af Amer: 49 — ABNORMAL LOW
GFR calc non Af Amer: 57 — ABNORMAL LOW
GFR calc non Af Amer: >60
GFR calc non Af Amer: >60
Glucose, Bld: 138 — ABNORMAL HIGH
Glucose, Bld: 221 — ABNORMAL HIGH
Potassium: 3.9
Potassium: 4
Potassium: 4.2
Potassium: 4.5
Sodium: 135
Sodium: 139
Sodium: 139
Sodium: 141
Sodium: 142

## 2011-08-10 LAB — MAGNESIUM: Magnesium: 2

## 2011-08-10 LAB — DIFFERENTIAL
Basophils Absolute: 0.1
Basophils Relative: 1
Eosinophils Absolute: 0
Monocytes Absolute: 0.2
Monocytes Relative: 2 — ABNORMAL LOW
Neutrophils Relative %: 87 — ABNORMAL HIGH

## 2011-08-10 LAB — COMPREHENSIVE METABOLIC PANEL
ALT: 14
ALT: 17
AST: 21
Albumin: 3.2 — ABNORMAL LOW
Albumin: 3.5
Alkaline Phosphatase: 61
Alkaline Phosphatase: 67
BUN: 43 — ABNORMAL HIGH
Chloride: 98
Chloride: 99
Glucose, Bld: 156 — ABNORMAL HIGH
Potassium: 4.1
Potassium: 4.1
Sodium: 138
Sodium: 138
Total Bilirubin: 0.8
Total Bilirubin: 0.9
Total Protein: 7.4

## 2011-08-10 LAB — HEPATIC FUNCTION PANEL
ALT: 18
ALT: 20
AST: 18
AST: 20
Alkaline Phosphatase: 55
Bilirubin, Direct: 0.3
Bilirubin, Direct: 0.3
Total Bilirubin: 1.5 — ABNORMAL HIGH

## 2011-08-10 LAB — URINE MICROSCOPIC-ADD ON

## 2011-08-10 LAB — URINALYSIS, ROUTINE W REFLEX MICROSCOPIC
Glucose, UA: NEGATIVE
Ketones, ur: NEGATIVE
Protein, ur: NEGATIVE
Urobilinogen, UA: 0.2

## 2011-08-11 LAB — PROTIME-INR
INR: 1.2
INR: 1.2
Prothrombin Time: 15
Prothrombin Time: 15.9 — ABNORMAL HIGH

## 2011-08-11 LAB — CBC
HCT: 32.3 — ABNORMAL LOW
HCT: 34.6 — ABNORMAL LOW
HCT: 39.4
Hemoglobin: 11 — ABNORMAL LOW
Hemoglobin: 13.1
Hemoglobin: 13.4
MCHC: 34
MCHC: 34.2
MCHC: 34.3
MCV: 92.6
MCV: 92.9
MCV: 92.9
MCV: 93
MCV: 94.3
MCV: 94.3
Platelets: 192
Platelets: 201
Platelets: 201
Platelets: 340
RBC: 3.81 — ABNORMAL LOW
RBC: 3.99
RBC: 4.14
RBC: 4.24
RDW: 12.9
RDW: 13.9
WBC: 11.6 — ABNORMAL HIGH
WBC: 11.8 — ABNORMAL HIGH
WBC: 13.4 — ABNORMAL HIGH
WBC: 15.8 — ABNORMAL HIGH
WBC: 16.3 — ABNORMAL HIGH
WBC: 21.2 — ABNORMAL HIGH

## 2011-08-11 LAB — CARBOXYHEMOGLOBIN
Carboxyhemoglobin: 0.8
Carboxyhemoglobin: 0.8
Methemoglobin: 0.9
Total hemoglobin: 13.2

## 2011-08-11 LAB — URINALYSIS, ROUTINE W REFLEX MICROSCOPIC
Bilirubin Urine: NEGATIVE
Nitrite: NEGATIVE
Protein, ur: NEGATIVE
Specific Gravity, Urine: 1.01
Urobilinogen, UA: 0.2

## 2011-08-11 LAB — BLOOD GAS, ARTERIAL
Acid-Base Excess: 0.9
Acid-base deficit: 0.3
Acid-base deficit: 5.8 — ABNORMAL HIGH
Bicarbonate: 24.2 — ABNORMAL HIGH
Bicarbonate: 24.4 — ABNORMAL HIGH
Drawn by: 29603
FIO2: 100
FIO2: 100
MECHVT: 500
O2 Saturation: 99.7
O2 Saturation: 99.8
PEEP: 5
Patient temperature: 98.6
Patient temperature: 98.6
RATE: 14
TCO2: 25.6
TCO2: 26.3
pCO2 arterial: 41.3
pCO2 arterial: 71.4
pH, Arterial: 7.122 — CL
pH, Arterial: 7.404 — ABNORMAL HIGH
pO2, Arterial: 295 — ABNORMAL HIGH
pO2, Arterial: 343 — ABNORMAL HIGH

## 2011-08-11 LAB — HEPATIC FUNCTION PANEL
ALT: 31
ALT: 39 — ABNORMAL HIGH
AST: 36
Albumin: 2.5 — ABNORMAL LOW
Albumin: 2.8 — ABNORMAL LOW
Alkaline Phosphatase: 51
Alkaline Phosphatase: 62
Bilirubin, Direct: <0.1
Total Bilirubin: 0.7
Total Bilirubin: 1
Total Protein: 5.9 — ABNORMAL LOW
Total Protein: 6.1

## 2011-08-11 LAB — I-STAT 8, (EC8 V) (CONVERTED LAB)
BUN: 21
Chloride: 106
Glucose, Bld: 257 — ABNORMAL HIGH
Hemoglobin: 17.3 — ABNORMAL HIGH
Potassium: 4.6
Sodium: 137
TCO2: 29
pH, Ven: 6.983 — CL

## 2011-08-11 LAB — BODY FLUID CELL COUNT WITH DIFFERENTIAL
Eos, Fluid: 0
Lymphs, Fluid: 14
Monocyte-Macrophage-Serous Fluid: 7 — ABNORMAL LOW
Neutrophil Count, Fluid: 79 — ABNORMAL HIGH
Other Cells, Fluid: 0
Total Nucleated Cell Count, Fluid: 208

## 2011-08-11 LAB — CARDIAC PANEL(CRET KIN+CKTOT+MB+TROPI)
CK, MB: 4.2 — ABNORMAL HIGH
CK, MB: 4.6 — ABNORMAL HIGH
CK, MB: 6.8 — ABNORMAL HIGH
Relative Index: INVALID
Total CK: 80
Total CK: 90
Total CK: 98

## 2011-08-11 LAB — APTT
aPTT: 31
aPTT: 32
aPTT: 38 — ABNORMAL HIGH

## 2011-08-11 LAB — POCT I-STAT 3, ART BLOOD GAS (G3+)
Operator id: 274071
Patient temperature: 98.1
pCO2 arterial: 96.3
pH, Arterial: 7.089 — CL

## 2011-08-11 LAB — BASIC METABOLIC PANEL
BUN: 19
CO2: 25
CO2: 26
CO2: 26
Chloride: 103
Chloride: 106
Creatinine, Ser: 1
GFR calc Af Amer: >60
GFR calc Af Amer: >60
Glucose, Bld: 113 — ABNORMAL HIGH
Glucose, Bld: 157 — ABNORMAL HIGH
Potassium: 3.7
Potassium: 3.7
Sodium: 136
Sodium: 140

## 2011-08-11 LAB — DIFFERENTIAL
Eosinophils Absolute: 0.2
Eosinophils Relative: 1
Lymphocytes Relative: 41
Monocytes Absolute: 0.4
Neutrophils Relative %: 55

## 2011-08-11 LAB — FUNGUS CULTURE W SMEAR

## 2011-08-11 LAB — URINE MICROSCOPIC-ADD ON

## 2011-08-11 LAB — CULTURE, BLOOD (ROUTINE X 2)
Culture: NO GROWTH
Culture: NO GROWTH

## 2011-08-11 LAB — CULTURE, BAL-QUANTITATIVE W GRAM STAIN: Colony Count: 8000

## 2011-08-11 LAB — ABO/RH: ABO/RH(D): O POS

## 2011-08-11 LAB — CROSSMATCH
ABO/RH(D): O POS
Antibody Screen: NEGATIVE

## 2011-08-11 LAB — URINE CULTURE
Colony Count: NO GROWTH
Special Requests: NEGATIVE

## 2011-08-11 LAB — D-DIMER, QUANTITATIVE: D-Dimer, Quant: 7.55 — ABNORMAL HIGH

## 2011-08-11 LAB — LIPASE, BLOOD
Lipase: 12
Lipase: 15

## 2011-08-11 LAB — GASTRIC OCCULT BLOOD (1-CARD TO LAB): Occult Blood, Gastric: POSITIVE — AB

## 2011-08-11 LAB — POCT CARDIAC MARKERS
CKMB, poc: 1
CKMB, poc: 2
Troponin i, poc: <0.05

## 2011-08-11 LAB — LACTATE DEHYDROGENASE: LDH: 276 — ABNORMAL HIGH

## 2011-08-11 LAB — POCT I-STAT CREATININE: Operator id: 288331

## 2011-08-11 LAB — AFB CULTURE WITH SMEAR (NOT AT ARMC): Acid Fast Smear: NONE SEEN

## 2011-08-11 LAB — CALCIUM, IONIZED: Calcium, Ion: 1.11 — ABNORMAL LOW

## 2011-08-11 LAB — MAGNESIUM
Magnesium: 2
Magnesium: 2.1

## 2011-08-11 LAB — PATHOLOGIST SMEAR REVIEW

## 2011-08-11 LAB — LACTIC ACID, PLASMA
Lactic Acid, Venous: 1.1
Lactic Acid, Venous: 1.7

## 2011-08-11 LAB — B-NATRIURETIC PEPTIDE (CONVERTED LAB): Pro B Natriuretic peptide (BNP): 1211 — ABNORMAL HIGH

## 2011-08-16 ENCOUNTER — Other Ambulatory Visit: Payer: Self-pay | Admitting: *Deleted

## 2011-08-16 MED ORDER — ROSUVASTATIN CALCIUM 40 MG PO TABS: 40.0000 mg | ORAL_TABLET | Freq: Every day | ORAL | Status: DC

## 2011-08-18 LAB — CBC
HCT: 36.4
HCT: 37.4
Hemoglobin: 12.1
Hemoglobin: 12.5
MCHC: 34.2
MCHC: 34.3
MCHC: 34.3
MCV: 95.7
MCV: 95.9
MCV: 96.4
Platelets: 215
RBC: 3.67 — ABNORMAL LOW
RBC: 3.79 — ABNORMAL LOW
RBC: 3.8 — ABNORMAL LOW
RDW: 13.9
RDW: 13.9
WBC: 6.4
WBC: 7.7

## 2011-08-18 LAB — BASIC METABOLIC PANEL
BUN: 2 — ABNORMAL LOW
BUN: 6
CO2: 29
Chloride: 103
GFR calc Af Amer: >60
GFR calc Af Amer: >60
GFR calc non Af Amer: >60
GFR calc non Af Amer: >60
GFR calc non Af Amer: >60
Glucose, Bld: 112 — ABNORMAL HIGH
Potassium: 3.1 — ABNORMAL LOW
Potassium: 3.3 — ABNORMAL LOW
Potassium: 3.7
Potassium: 3.9
Sodium: 140
Sodium: 140
Sodium: 144

## 2011-08-18 LAB — LIPASE, BLOOD: Lipase: 12

## 2011-08-18 LAB — APTT: aPTT: 39 — ABNORMAL HIGH

## 2011-08-18 LAB — GLUCOSE, CAPILLARY
Glucose-Capillary: 102 — ABNORMAL HIGH
Glucose-Capillary: 103 — ABNORMAL HIGH
Glucose-Capillary: 105 — ABNORMAL HIGH
Glucose-Capillary: 112 — ABNORMAL HIGH
Glucose-Capillary: 115 — ABNORMAL HIGH
Glucose-Capillary: 116 — ABNORMAL HIGH
Glucose-Capillary: 125 — ABNORMAL HIGH
Glucose-Capillary: 129 — ABNORMAL HIGH
Glucose-Capillary: 129 — ABNORMAL HIGH
Glucose-Capillary: 130 — ABNORMAL HIGH
Glucose-Capillary: 132 — ABNORMAL HIGH
Glucose-Capillary: 141 — ABNORMAL HIGH
Glucose-Capillary: 150 — ABNORMAL HIGH
Glucose-Capillary: 182 — ABNORMAL HIGH
Glucose-Capillary: 76
Glucose-Capillary: 76
Glucose-Capillary: 87
Glucose-Capillary: 97

## 2011-08-18 LAB — COMPREHENSIVE METABOLIC PANEL
ALT: 21
AST: 22
BUN: 10
CO2: 26
CO2: 29
Calcium: 8.9
Calcium: 9.3
Chloride: 106
Creatinine, Ser: 0.77
Creatinine, Ser: 0.79
GFR calc Af Amer: >60
GFR calc Af Amer: >60
GFR calc non Af Amer: >60
GFR calc non Af Amer: >60
Glucose, Bld: 106 — ABNORMAL HIGH
Glucose, Bld: 97
Sodium: 140
Total Bilirubin: 0.8
Total Protein: 7.7

## 2011-08-18 LAB — ANAEROBIC CULTURE: Gram Stain: NONE SEEN

## 2011-08-18 LAB — CULTURE, ROUTINE-ABSCESS: Culture: NO GROWTH

## 2011-08-18 LAB — URINALYSIS, ROUTINE W REFLEX MICROSCOPIC
Glucose, UA: NEGATIVE
Nitrite: NEGATIVE
Specific Gravity, Urine: 1.025
pH: 5.5

## 2011-08-18 LAB — URINE CULTURE

## 2011-08-18 LAB — URINE MICROSCOPIC-ADD ON

## 2011-08-18 LAB — DIFFERENTIAL
Lymphocytes Relative: 22
Lymphs Abs: 1.8
Monocytes Relative: 5
Neutrophils Relative %: 71

## 2011-08-18 LAB — PROTIME-INR
INR: 1.1
Prothrombin Time: 14.8

## 2011-08-23 LAB — CBC
HCT: 35.1 — ABNORMAL LOW
HCT: 35.1 — ABNORMAL LOW
HCT: 35.3 — ABNORMAL LOW
HCT: 36.3
HCT: 39.4
Hemoglobin: 11.8 — ABNORMAL LOW
Hemoglobin: 11.8 — ABNORMAL LOW
Hemoglobin: 12
Hemoglobin: 12.3
Hemoglobin: 12.8
Hemoglobin: 13.4
MCHC: 33.6
MCHC: 34
MCHC: 34.1
MCV: 95.4
MCV: 95.9
MCV: 96.3
Platelets: 143 — ABNORMAL LOW
Platelets: 172
RBC: 3.63 — ABNORMAL LOW
RBC: 3.65 — ABNORMAL LOW
RBC: 3.68 — ABNORMAL LOW
RBC: 3.77 — ABNORMAL LOW
RBC: 3.94
RBC: 4.07
RDW: 12.6
RDW: 12.7
RDW: 13.1
RDW: 13.2
RDW: 13.3
WBC: 14 — ABNORMAL HIGH
WBC: 5.4
WBC: 7.6
WBC: 8.9
WBC: 9.4

## 2011-08-23 LAB — GLUCOSE, CAPILLARY
Glucose-Capillary: 100 — ABNORMAL HIGH
Glucose-Capillary: 100 — ABNORMAL HIGH
Glucose-Capillary: 104 — ABNORMAL HIGH
Glucose-Capillary: 104 — ABNORMAL HIGH
Glucose-Capillary: 106 — ABNORMAL HIGH
Glucose-Capillary: 114 — ABNORMAL HIGH
Glucose-Capillary: 121 — ABNORMAL HIGH
Glucose-Capillary: 122 — ABNORMAL HIGH
Glucose-Capillary: 123 — ABNORMAL HIGH
Glucose-Capillary: 124 — ABNORMAL HIGH
Glucose-Capillary: 135 — ABNORMAL HIGH
Glucose-Capillary: 140 — ABNORMAL HIGH
Glucose-Capillary: 150 — ABNORMAL HIGH
Glucose-Capillary: 154 — ABNORMAL HIGH
Glucose-Capillary: 154 — ABNORMAL HIGH
Glucose-Capillary: 163 — ABNORMAL HIGH
Glucose-Capillary: 167 — ABNORMAL HIGH
Glucose-Capillary: 177 — ABNORMAL HIGH
Glucose-Capillary: 178 — ABNORMAL HIGH
Glucose-Capillary: 193 — ABNORMAL HIGH
Glucose-Capillary: 73
Glucose-Capillary: 75
Glucose-Capillary: 85
Glucose-Capillary: 87

## 2011-08-23 LAB — BASIC METABOLIC PANEL WITH GFR
BUN: 6
BUN: 9
CO2: 26
CO2: 28
Calcium: 8.7
Calcium: 8.9
Chloride: 106
Chloride: 108
Creatinine, Ser: 0.65
Creatinine, Ser: 0.85
GFR calc non Af Amer: >60
GFR calc non Af Amer: >60
Glucose, Bld: 105 — ABNORMAL HIGH
Glucose, Bld: 184 — ABNORMAL HIGH
Potassium: 3.6
Potassium: 4.3
Sodium: 139
Sodium: 140

## 2011-08-23 LAB — BASIC METABOLIC PANEL
Calcium: 8.3 — ABNORMAL LOW
GFR calc Af Amer: >60
GFR calc non Af Amer: >60
Glucose, Bld: 102 — ABNORMAL HIGH
Potassium: 3.5
Sodium: 141

## 2011-08-23 LAB — COMPREHENSIVE METABOLIC PANEL WITH GFR
ALT: 10
AST: 14
Albumin: 2.8 — ABNORMAL LOW
Alkaline Phosphatase: 39
BUN: 8
CO2: 27
Calcium: 8.4
Chloride: 106
Creatinine, Ser: 0.63
GFR calc non Af Amer: >60
Glucose, Bld: 153 — ABNORMAL HIGH
Potassium: 3.7
Sodium: 139
Total Bilirubin: 0.7
Total Protein: 6.2

## 2011-08-23 LAB — ANAEROBIC CULTURE: Gram Stain: NONE SEEN

## 2011-08-23 LAB — URINALYSIS, ROUTINE W REFLEX MICROSCOPIC
Nitrite: POSITIVE — AB
Specific Gravity, Urine: 1.026
pH: 5

## 2011-08-23 LAB — CULTURE, BLOOD (ROUTINE X 2): Culture: NO GROWTH

## 2011-08-23 LAB — COMPREHENSIVE METABOLIC PANEL
Alkaline Phosphatase: 42
BUN: 13
CO2: 27
GFR calc non Af Amer: >60
Glucose, Bld: 73
Potassium: 3.8
Total Bilirubin: 0.7
Total Protein: 7.6

## 2011-08-23 LAB — LIPASE, BLOOD: Lipase: 13

## 2011-08-23 LAB — APTT

## 2011-08-23 LAB — DIFFERENTIAL
Basophils Absolute: 0.1
Basophils Relative: 1
Eosinophils Absolute: 0
Monocytes Relative: 5
Neutro Abs: 10.6 — ABNORMAL HIGH
Neutrophils Relative %: 79 — ABNORMAL HIGH

## 2011-08-23 LAB — URINE CULTURE

## 2011-08-23 LAB — URINE MICROSCOPIC-ADD ON

## 2011-08-23 LAB — HEMOGLOBIN A1C: Mean Plasma Glucose: 120

## 2011-08-30 LAB — BASIC METABOLIC PANEL
BUN: 7
CO2: 26
CO2: 29
Calcium: 8.3 — ABNORMAL LOW
Chloride: 100
Chloride: 106
GFR calc Af Amer: >60
GFR calc Af Amer: >60
GFR calc non Af Amer: >60
GFR calc non Af Amer: >60
Glucose, Bld: 85
Glucose, Bld: 92
Potassium: 3.2 — ABNORMAL LOW
Potassium: 3.4 — ABNORMAL LOW
Potassium: 3.6
Sodium: 138
Sodium: 139
Sodium: 141

## 2011-08-30 LAB — DIFFERENTIAL
Basophils Relative: 24 — ABNORMAL HIGH
Eosinophils Absolute: 0.1
Lymphs Abs: 4.2 — ABNORMAL HIGH
Monocytes Absolute: 1.2 — ABNORMAL HIGH
Monocytes Relative: 6
Neutrophils Relative %: 46

## 2011-08-30 LAB — URINE CULTURE

## 2011-08-30 LAB — URINALYSIS, ROUTINE W REFLEX MICROSCOPIC
Bilirubin Urine: NEGATIVE
Glucose, UA: NEGATIVE
Hgb urine dipstick: NEGATIVE
Protein, ur: 30 — AB
Urobilinogen, UA: 0.2

## 2011-08-30 LAB — HEPATIC FUNCTION PANEL
AST: 20
Bilirubin, Direct: 0.1
Indirect Bilirubin: 0.4
Total Bilirubin: 0.5

## 2011-08-30 LAB — CBC
HCT: 36.4
HCT: 37.6
HCT: 40.2
Hemoglobin: 12.7
Hemoglobin: 12.9
Hemoglobin: 14.2
MCHC: 34.3
MCHC: 35.4
MCV: 94
MCV: 94.7
Platelets: 276
RBC: 3.85 — ABNORMAL LOW
RBC: 4.28
RDW: 14
WBC: 10.6 — ABNORMAL HIGH
WBC: 18.2 — ABNORMAL HIGH

## 2011-08-30 LAB — HEMOGLOBIN A1C: Hgb A1c MFr Bld: 6.9 — ABNORMAL HIGH

## 2011-08-30 LAB — LIPID PANEL
Cholesterol: 148
HDL: 30 — ABNORMAL LOW
Total CHOL/HDL Ratio: 4.9

## 2011-08-30 LAB — URINE MICROSCOPIC-ADD ON

## 2011-09-04 LAB — URINALYSIS, ROUTINE W REFLEX MICROSCOPIC
Hgb urine dipstick: NEGATIVE
Nitrite: NEGATIVE
Protein, ur: NEGATIVE
Urobilinogen, UA: 0.2

## 2011-09-04 LAB — COMPREHENSIVE METABOLIC PANEL
ALT: 25
AST: 30
Albumin: 3.2 — ABNORMAL LOW
Calcium: 9.6
Creatinine, Ser: 1
GFR calc Af Amer: >60
Sodium: 141

## 2011-09-04 LAB — CBC
MCHC: 34.7
MCV: 93.5
Platelets: 225
WBC: 17.1 — ABNORMAL HIGH

## 2011-09-04 LAB — DIFFERENTIAL
Eosinophils Relative: 1
Lymphocytes Relative: 14
Lymphs Abs: 2.4
Monocytes Absolute: 0.7
Monocytes Relative: 4

## 2011-09-12 ENCOUNTER — Ambulatory Visit: Payer: Medicare Other | Admitting: Internal Medicine

## 2011-09-20 ENCOUNTER — Encounter: Payer: Self-pay | Admitting: Internal Medicine

## 2011-09-20 ENCOUNTER — Ambulatory Visit (INDEPENDENT_AMBULATORY_CARE_PROVIDER_SITE_OTHER): Payer: Medicare Other | Admitting: Internal Medicine

## 2011-09-20 VITALS — BP 110/60 | HR 95 | Temp 97.8°F | Ht 65.0 in | Wt 178.2 lb

## 2011-09-20 DIAGNOSIS — J449 Chronic obstructive pulmonary disease, unspecified: Secondary | ICD-10-CM

## 2011-09-20 MED ORDER — FLUTICASONE-SALMETEROL 250-50 MCG/DOSE IN AEPB: 1.0000 | INHALATION_SPRAY | Freq: Two times a day (BID) | RESPIRATORY_TRACT | Status: DC

## 2011-09-20 NOTE — Patient Instructions (Addendum)
Clinically copd appears stable Continue advair - nurse will ensure that you have enough refillls Return in 9 month for followup I reespect your decision to refuse flu shot Next visit, will check walking exertion, alpha 1 and spirometry 

## 2011-09-20 NOTE — Progress Notes (Signed)
Subjective:    Patient ID: [name], female    DOB: February 13, 1941, 70 y.o.   MRN: [id number]  HPI  Problem List 1. Ex Heavy Smoker 2. 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 Refuses spiriva, flu shot, rehab 3. AECOPD hx - Aug 2011  (office Rx)   OV 09/20/11: FU for above. Last visit over a year ago on 08/01/2010. Doing well. Though admitted on ROS she has chest tightness, dyspnea and sinus issues to CMA, she is denying this to me. Feels well. AGain, refused rehab and flu shot. No new complains  Past Medical History  Diagnosis Date  . CAD (coronary artery disease)     s/p inferior STEMI 12/29/10 with rotablator atherectomy RCA 12/31/10 and DES x 2 RCA  . Ischemic cardiomyopathy     EF 45% by cath 12/29/10.   . Diabetes mellitus     type 2  . HTN (hypertension)   . Hyperlipidemia   . Diverticulosis   . Asthma   . COPD (chronic obstructive pulmonary disease)   . Small bowel obstruction     from a sigmoid stricture  . Nephrolithiasis   . Sleep apnea   . Overweight   . Osteoarthritis   . GERD (gastroesophageal reflux disease)   . Anxiety   . Glaucoma   . History of colonoscopy      Family History  Problem Relation Age of Onset  . Breast cancer Maternal Grandmother   . Diabetes Father   . Diabetes Sister   . Heart disease Father   . Colon cancer Neg Hx      History   Social History  . Marital Status: Married    Spouse Name: N/A    Number of Children: N/A  . Years of Education: N/A   Occupational History  . Retired     Programmer, systems    Social History Main Topics  . Smoking status: Former Smoker -- 1.0 packs/day for 25 years    Types: Cigarettes    Quit date: 10/20/2004  . Smokeless tobacco: Never Used   Comment: over a ppd for 40+ years; quit 10/2004  . Alcohol Use: Yes     rare   . Drug Use: No  . Sexually Active: Not on file   Other Topics Concern  . Not on file   Social History Narrative  . No  narrative on file     Allergies  Allergen Reactions  . Erythromycin   . Lisinopril     REACTION: respiratory arrest jan 2009  . Penicillins   . Pentazocine Lactate   . Propoxyphene Hcl   . Quinapril Hcl   . Sulfonamide Derivatives      Outpatient Prescriptions Prior to Visit  Medication Sig Dispense Refill  . albuterol (PROVENTIL) (2.5 MG/3ML) 0.083% nebulizer solution Take 2.5 mg by nebulization every 6 (six) hours as needed.        Marland Kitchen albuterol (PROVENTIL,VENTOLIN) 90 MCG/ACT inhaler Inhale 2 puffs into the lungs 2 (two) times daily.        Marland Kitchen amLODipine (NORVASC) 2.5 MG tablet Take 2.5 mg by mouth daily.        Marland Kitchen aspirin 81 MG tablet Take 81 mg by mouth daily.        . cyclobenzaprine (FLEXERIL) 10 MG tablet 1/2 to 1 tablet 2-3 times daily as needed for muscle spasms       . ezetimibe (ZETIA) 10 MG tablet  Take 10 mg by mouth daily.        Marland Kitchen glipiZIDE (GLUCOTROL) 5 MG tablet Take 5 mg by mouth daily.        . metoprolol tartrate (LOPRESSOR) 25 MG tablet Take 25 mg by mouth 2 (two) times daily.        . nitroGLYCERIN (NITROSTAT) 0.4 MG SL tablet Place 0.4 mg under the tongue every 5 (five) minutes as needed.        Marland Kitchen oxyCODONE-acetaminophen (PERCOCET) 5-325 MG per tablet Two times a day as needed       . polyethylene glycol powder (MIRALAX) powder Take 17 g by mouth daily.        . prasugrel (EFFIENT) 10 MG TABS Take by mouth daily.        . pregabalin (LYRICA) 75 MG capsule Take 75 mg by mouth 2 (two) times daily.        . rosuvastatin (CRESTOR) 40 MG tablet Take 1 tablet (40 mg total) by mouth daily.  30 tablet  8  . timolol (BETIMOL) 0.5 % ophthalmic solution Place 1 drop into the right eye 2 (two) times daily.        . tizanidine (ZANAFLEX) 2 MG capsule 1-2 every 6 hours       . tolterodine (DETROL LA) 4 MG 24 hr capsule Take 4 mg by mouth daily.        . travoprost, benzalkonium, (TRAVATAN) 0.004 % ophthalmic solution Place 1 drop into both eyes at bedtime.        .  Fluticasone-Salmeterol (ADVAIR DISKUS) 250-50 MCG/DOSE AEPB Inhale 1 puff into the lungs 2 (two) times daily.        . metFORMIN (GLUMETZA) 500 MG (MOD) 24 hr tablet Take 500 mg by mouth 2 (two) times daily.            Review of Systems  Constitutional: Negative for fever and unexpected weight change.  HENT: Positive for sneezing, postnasal drip and sinus pressure. Negative for ear pain, nosebleeds, congestion, sore throat, rhinorrhea, trouble swallowing and dental problem.   Eyes: Negative for redness and itching.  Respiratory: Positive for chest tightness and shortness of breath. Negative for cough and wheezing.   Cardiovascular: Positive for palpitations and leg swelling.  Gastrointestinal: Negative for nausea, vomiting and diarrhea.  Genitourinary: Positive for dysuria.  Musculoskeletal: Negative for joint swelling.  Skin: Negative for rash.  Neurological: Negative for headaches.  Hematological: Bruises/bleeds easily.  Psychiatric/Behavioral: Negative for dysphoric mood. The patient is not nervous/anxious.        Objective:   Physical Exam  Vitals reviewed. Constitutional: She is oriented to person, place, and time. She appears well-developed and well-nourished. No distress.       overweight  HENT:  Head: Normocephalic and atraumatic.  Right Ear: External ear normal.  Left Ear: External ear normal.  Mouth/Throat: Oropharynx is clear and moist. No oropharyngeal exudate.  Eyes: Conjunctivae and EOM are normal. Pupils are equal, round, and reactive to light. Right eye exhibits no discharge. Left eye exhibits no discharge. No scleral icterus.  Neck: Normal range of motion. Neck supple. No JVD present. No tracheal deviation present. No thyromegaly present.  Cardiovascular: Normal rate, regular rhythm, normal heart sounds and intact distal pulses.  Exam reveals no gallop and no friction rub.   No murmur heard. Pulmonary/Chest: Effort normal and breath sounds normal. No respiratory  distress. She has no wheezes. She has no rales. She exhibits no tenderness.  Abdominal: Soft. Bowel sounds are  normal. She exhibits no distension and no mass. There is no tenderness. There is no rebound and no guarding.  Musculoskeletal: Normal range of motion. She exhibits no edema and no tenderness.  Lymphadenopathy:    She has no cervical adenopathy.  Neurological: She is alert and oriented to person, place, and time. She has normal reflexes. No cranial nerve deficit. She exhibits normal muscle tone. Coordination normal.  Skin: Skin is warm and dry. No rash noted. She is not diaphoretic. No erythema. No pallor.  Psychiatric: She has a normal mood and affect. Her behavior is normal. Judgment and thought content normal.          Assessment & Plan:

## 2011-09-24 NOTE — Assessment & Plan Note (Signed)
Clinically copd appears stable Continue advair - nurse will ensure that you have enough refillls Return in 9 month for followup I reespect your decision to refuse flu shot Next visit, will check walking exertion, alpha 1 and spirometry

## 2011-10-03 ENCOUNTER — Ambulatory Visit: Payer: Medicare Other | Admitting: Cardiovascular Disease

## 2011-10-25 ENCOUNTER — Emergency Department (HOSPITAL_COMMUNITY): Payer: Medicare Other

## 2011-10-25 ENCOUNTER — Emergency Department (HOSPITAL_COMMUNITY)
Admission: EM | Admit: 2011-10-25 | Discharge: 2011-10-25 | Disposition: A | Discharge status: Home / Self Care | Payer: Medicare Other | Attending: Emergency Medicine | Admitting: Emergency Medicine

## 2011-10-25 ENCOUNTER — Encounter (HOSPITAL_COMMUNITY): Payer: Self-pay | Admitting: *Deleted

## 2011-10-25 DIAGNOSIS — M1990 Unspecified osteoarthritis, unspecified site: Secondary | ICD-10-CM | POA: Insufficient documentation

## 2011-10-25 DIAGNOSIS — R070 Pain in throat: Secondary | ICD-10-CM | POA: Insufficient documentation

## 2011-10-25 DIAGNOSIS — R000 Tachycardia, unspecified: Secondary | ICD-10-CM | POA: Insufficient documentation

## 2011-10-25 DIAGNOSIS — R51 Headache: Secondary | ICD-10-CM | POA: Insufficient documentation

## 2011-10-25 DIAGNOSIS — I2510 Atherosclerotic heart disease of native coronary artery without angina pectoris: Secondary | ICD-10-CM | POA: Insufficient documentation

## 2011-10-25 DIAGNOSIS — R4182 Altered mental status, unspecified: Secondary | ICD-10-CM | POA: Insufficient documentation

## 2011-10-25 DIAGNOSIS — J3489 Other specified disorders of nose and nasal sinuses: Secondary | ICD-10-CM | POA: Insufficient documentation

## 2011-10-25 DIAGNOSIS — R5381 Other malaise: Secondary | ICD-10-CM | POA: Insufficient documentation

## 2011-10-25 DIAGNOSIS — E785 Hyperlipidemia, unspecified: Secondary | ICD-10-CM | POA: Insufficient documentation

## 2011-10-25 DIAGNOSIS — J449 Chronic obstructive pulmonary disease, unspecified: Secondary | ICD-10-CM | POA: Insufficient documentation

## 2011-10-25 DIAGNOSIS — R739 Hyperglycemia, unspecified: Secondary | ICD-10-CM

## 2011-10-25 DIAGNOSIS — M549 Dorsalgia, unspecified: Secondary | ICD-10-CM | POA: Insufficient documentation

## 2011-10-25 DIAGNOSIS — R630 Anorexia: Secondary | ICD-10-CM | POA: Insufficient documentation

## 2011-10-25 DIAGNOSIS — G8929 Other chronic pain: Secondary | ICD-10-CM | POA: Insufficient documentation

## 2011-10-25 DIAGNOSIS — I10 Essential (primary) hypertension: Secondary | ICD-10-CM | POA: Insufficient documentation

## 2011-10-25 DIAGNOSIS — H409 Unspecified glaucoma: Secondary | ICD-10-CM | POA: Insufficient documentation

## 2011-10-25 DIAGNOSIS — R509 Fever, unspecified: Secondary | ICD-10-CM | POA: Insufficient documentation

## 2011-10-25 DIAGNOSIS — K219 Gastro-esophageal reflux disease without esophagitis: Secondary | ICD-10-CM | POA: Insufficient documentation

## 2011-10-25 DIAGNOSIS — J4489 Other specified chronic obstructive pulmonary disease: Secondary | ICD-10-CM | POA: Insufficient documentation

## 2011-10-25 DIAGNOSIS — E119 Type 2 diabetes mellitus without complications: Secondary | ICD-10-CM | POA: Insufficient documentation

## 2011-10-25 DIAGNOSIS — R05 Cough: Secondary | ICD-10-CM | POA: Insufficient documentation

## 2011-10-25 DIAGNOSIS — R059 Cough, unspecified: Secondary | ICD-10-CM | POA: Insufficient documentation

## 2011-10-25 DIAGNOSIS — Z79899 Other long term (current) drug therapy: Secondary | ICD-10-CM | POA: Insufficient documentation

## 2011-10-25 DIAGNOSIS — J111 Influenza due to unidentified influenza virus with other respiratory manifestations: Secondary | ICD-10-CM | POA: Insufficient documentation

## 2011-10-25 LAB — URINE MICROSCOPIC-ADD ON

## 2011-10-25 LAB — CBC
HCT: 40.9 % (ref 36.0–46.0)
Hemoglobin: 13.8 g/dL (ref 12.0–15.0)
MCH: 32 pg (ref 26.0–34.0)
MCHC: 33.7 g/dL (ref 30.0–36.0)
MCV: 94.9 fL (ref 78.0–100.0)

## 2011-10-25 LAB — DIFFERENTIAL
Basophils Relative: 0 % (ref 0–1)
Eosinophils Absolute: 0 10*3/uL (ref 0.0–0.7)
Eosinophils Relative: 0 % (ref 0–5)
Monocytes Absolute: 0.4 10*3/uL (ref 0.1–1.0)
Monocytes Relative: 5 % (ref 3–12)
Neutro Abs: 6.8 10*3/uL (ref 1.7–7.7)

## 2011-10-25 LAB — BASIC METABOLIC PANEL
BUN: 13 mg/dL (ref 6–23)
Chloride: 97 mEq/L (ref 96–112)
Creatinine, Ser: 0.81 mg/dL (ref 0.50–1.10)
GFR calc Af Amer: 84 mL/min — ABNORMAL LOW (ref >90)

## 2011-10-25 LAB — URINALYSIS, ROUTINE W REFLEX MICROSCOPIC
Bilirubin Urine: NEGATIVE
Ketones, ur: 15 mg/dL — AB
Nitrite: NEGATIVE
pH: 5.5 (ref 5.0–8.0)

## 2011-10-25 MED ORDER — SODIUM CHLORIDE 0.9 % IV BOLUS (SEPSIS): 500.0000 mL | Freq: Once | INTRAVENOUS | Status: DC

## 2011-10-25 MED ORDER — ACETAMINOPHEN 325 MG PO TABS
ORAL_TABLET | ORAL | Status: AC
  Administered 2011-10-25: 650 mg
  Filled 2011-10-25: qty 2

## 2011-10-25 NOTE — ED Notes (Signed)
Attempted IV x1 without success. IV team paged. 

## 2011-10-25 NOTE — ED Notes (Signed)
IV team at bedside.  Order for IV d/c by PA.  IV team notified of same.

## 2011-10-25 NOTE — ED Provider Notes (Signed)
BP 132/63  Pulse 104  Temp(Src) 98.5 F (36.9 C) (Oral)  Resp 20  SpO2 96% Pt seen with PA Stable at this time, she is awake/alert no distress Pt has had recent cough/fever   Joya Gaskins, MD 10/25/11 2306

## 2011-10-25 NOTE — ED Notes (Signed)
Daughter states she was called by her Dad who states patient was confused  C/o headache was able to follow commands c/o generalized bodyaches with pain in there eyes and ears.

## 2011-10-25 NOTE — ED Notes (Signed)
Daughter states her Dad called her earlier today and said that pt was confused.  She thougt it was night time and was talking about their horses and they do not own horses.  Daughter reports that pt was shuffling her feet when she got there.  States that pt had temperature on arrival to ED and since temp has come down pt is more oriented.  Daughter also reports episodes over the past couple of months where pt would change the subject and start talking about things that do not make sense.  Pt c/o sore throat and just not feeling well lately.  Pt to CT and x-ray at this time.

## 2011-10-25 NOTE — ED Provider Notes (Signed)
History     CSN: [id number] Arrival date & time: 10/25/2011  5:01 PM   First MD Initiated Contact with Patient 10/25/11 2005      Chief Complaint  Patient presents with  . Altered Mental Status    HPI  History provided by the patient and her daughter. Patient presents with complaints of feeling ill, with fever, cough and generalized fatigue. Patient's daughter reports that her father called her concerned for her mother's condition. Patient reports having a fever that began last night. She reports having a poor night of sleep and waking up early this morning. She's had a slight dry cough with a sore throat. Patient has been around grandchildren with cough and cold symptoms recently. Patient denies feeling confused earlier but states that she just feels awful all day. Patient admits to poor by mouth intake. She denies any dysuria, urinary frequency, diarrhea, constipation, nausea or vomiting. Patient has chronic neck and back pains she reports are unchanged. She denies headache or rash. Patient does have significant medical history for diabetes hypertension and COPD. Patient was also recently the with husband in the hospital for a procedure he was having.     Past Medical History  Diagnosis Date  . CAD (coronary artery disease)     s/p inferior STEMI 12/29/10 with rotablator atherectomy RCA 12/31/10 and DES x 2 RCA  . Ischemic cardiomyopathy     EF 45% by cath 12/29/10.   . Diabetes mellitus     type 2  . HTN (hypertension)   . Hyperlipidemia   . Diverticulosis   . Asthma   . COPD (chronic obstructive pulmonary disease)   . Small bowel obstruction     from a sigmoid stricture  . Nephrolithiasis   . Sleep apnea   . Overweight   . Osteoarthritis   . GERD (gastroesophageal reflux disease)   . Anxiety   . Glaucoma   . History of colonoscopy     Past Surgical History  Procedure Date  . Bilateral tubal ligation   . Lithotripsy   . Lap sigmoid colectomy with repair of colovesical  fistula 07/31/2008  . Cardiac surgery     Family History  Problem Relation Age of Onset  . Breast cancer Maternal Grandmother   . Diabetes Father   . Diabetes Sister   . Heart disease Father   . Colon cancer Neg Hx     History  Substance Use Topics  . Smoking status: Former Smoker -- 1.0 packs/day for 25 years    Types: Cigarettes    Quit date: 10/20/2004  . Smokeless tobacco: Never Used   Comment: over a ppd for 40+ years; quit 10/2004  . Alcohol Use: No     rare     OB History    Grav Para Term Preterm Abortions TAB SAB Ect Mult Living                  Review of Systems  Constitutional: Positive for fever, chills, activity change, appetite change and fatigue.  HENT: Positive for sore throat and rhinorrhea. Negative for congestion.   Respiratory: Positive for cough. Negative for shortness of breath.   Cardiovascular: Negative for chest pain.  Gastrointestinal: Negative for nausea, vomiting, abdominal pain, diarrhea and constipation.  Genitourinary: Negative for dysuria, frequency, hematuria and flank pain.  Skin: Negative for rash.  Neurological: Positive for headaches. Negative for dizziness, speech difficulty, weakness and light-headedness.    Allergies  Erythromycin; Lisinopril; Penicillins; Pentazocine lactate; Propoxyphene hcl; Quinapril  hcl; and Sulfonamide derivatives  Home Medications   Current Outpatient Rx  Name Route Sig Dispense Refill  . ALBUTEROL SULFATE (2.5 MG/3ML) 0.083% IN NEBU Nebulization Take 2.5 mg by nebulization every 6 (six) hours as needed. For shortness of breath.    . ALBUTEROL 90 MCG/ACT IN AERS Inhalation Inhale 2 puffs into the lungs 2 (two) times daily.      Marland Kitchen AMLODIPINE BESYLATE 2.5 MG PO TABS Oral Take 2.5 mg by mouth daily.      . ASPIRIN 81 MG PO TABS Oral Take 81 mg by mouth daily.      . CYCLOBENZAPRINE HCL 10 MG PO TABS Oral Take 5-10 mg by mouth 3 (three) times daily as needed. For muscle spasms.    Marland Kitchen EZETIMIBE 10 MG PO TABS  Oral Take 10 mg by mouth daily.      Marland Kitchen FLUTICASONE-SALMETEROL 250-50 MCG/DOSE IN AEPB Inhalation Inhale 1 puff into the lungs 2 (two) times daily.      Marland Kitchen GLIPIZIDE 5 MG PO TABS Oral Take 5 mg by mouth daily.      . JENTADUETO 2.03-999 MG PO TABS Oral Take 1 tablet by mouth Twice daily.    Marland Kitchen METOPROLOL TARTRATE 25 MG PO TABS Oral Take 25 mg by mouth 2 (two) times daily.      Marland Kitchen NITROGLYCERIN 0.4 MG SL SUBL Sublingual Place 0.4 mg under the tongue every 5 (five) minutes as needed. For chest pain.    . OXYCODONE-ACETAMINOPHEN 5-325 MG PO TABS Oral Take 1 tablet by mouth 2 (two) times daily as needed. For pain.    Marland Kitchen POLYETHYLENE GLYCOL 3350 PO POWD Oral Take 17 g by mouth daily.      Marland Kitchen PRASUGREL HCL 10 MG PO TABS Oral Take 10 mg by mouth daily.     Marland Kitchen PREGABALIN 75 MG PO CAPS Oral Take 75 mg by mouth 2 (two) times daily.      Marland Kitchen ROSUVASTATIN CALCIUM 40 MG PO TABS Oral Take 40 mg by mouth daily.      Marland Kitchen TIMOLOL HEMIHYDRATE 0.5 % OP SOLN Right Eye Place 1 drop into the right eye 2 (two) times daily.      Marland Kitchen TIZANIDINE HCL 2 MG PO CAPS Oral Take 2-4 mg by mouth every 6 (six) hours.     . TOLTERODINE TARTRATE 4 MG PO CP24 Oral Take 4 mg by mouth daily.      . TRAVOPROST 0.004 % OP SOLN Both Eyes Place 1 drop into both eyes at bedtime.        BP 116/77  Pulse 111  Temp(Src) 100.7 F (38.2 C) (Oral)  Resp 20  SpO2 94%  Physical Exam  Nursing note and vitals reviewed. Constitutional: She is oriented to person, place, and time. She appears well-developed and well-nourished.  Eyes: Conjunctivae and EOM are normal. Pupils are equal, round, and reactive to light. No scleral icterus.  Neck: Normal range of motion. Neck supple.       No meningeal signs  Cardiovascular: Regular rhythm.  Tachycardia present.   No murmur heard. Pulmonary/Chest: Effort normal and breath sounds normal. No respiratory distress. She has no wheezes. She has no rales.  Abdominal: Soft. Bowel sounds are normal. She exhibits no  distension. There is no tenderness. There is no rebound and no guarding.  Lymphadenopathy:    She has no cervical adenopathy.  Neurological: She is alert and oriented to person, place, and time. She has normal strength. No cranial nerve deficit or  sensory deficit. Gait normal.  Skin: Skin is warm. No rash noted. No erythema.  Psychiatric: She has a normal mood and affect. Her behavior is normal.    ED Course  Procedures (including critical care time)  Labs Reviewed  CBC - Abnormal; Notable for the following:    Platelets 112 (*) PLATELET COUNT CONFIRMED BY SMEAR   All other components within normal limits  DIFFERENTIAL - Abnormal; Notable for the following:    Neutrophils Relative 85 (*)    Lymphocytes Relative 10 (*)    All other components within normal limits  BASIC METABOLIC PANEL - Abnormal; Notable for the following:    Sodium 134 (*)    Glucose, Bld 289 (*)    GFR calc non Af Amer 72 (*)    GFR calc Af Amer 84 (*)    All other components within normal limits  URINALYSIS, ROUTINE W REFLEX MICROSCOPIC - Abnormal; Notable for the following:    APPearance CLOUDY (*)    Specific Gravity, Urine 1.034 (*)    Glucose, UA >1000 (*)    Hgb urine dipstick TRACE (*)    Ketones, ur 15 (*)    Protein, ur 100 (*)    All other components within normal limits  URINE MICROSCOPIC-ADD ON - Abnormal; Notable for the following:    Squamous Epithelial / LPF MANY (*)    Casts GRANULAR CAST (*)    All other components within normal limits   Results for orders placed during the hospital encounter of 10/25/11  CBC      Component Value Range   WBC 8.1  4.0 - 10.5 (K/uL)   RBC 4.31  3.87 - 5.11 (MIL/uL)   Hemoglobin 13.8  12.0 - 15.0 (g/dL)   HCT 16.1  09.6 - 04.5 (%)   MCV 94.9  78.0 - 100.0 (fL)   MCH 32.0  26.0 - 34.0 (pg)   MCHC 33.7  30.0 - 36.0 (g/dL)   RDW 40.9  81.1 - 91.4 (%)   Platelets 112 (*) 150 - 400 (K/uL)  DIFFERENTIAL      Component Value Range   Neutrophils Relative 85  (*) 43 - 77 (%)   Neutro Abs 6.8  1.7 - 7.7 (K/uL)   Lymphocytes Relative 10 (*) 12 - 46 (%)   Lymphs Abs 0.8  0.7 - 4.0 (K/uL)   Monocytes Relative 5  3 - 12 (%)   Monocytes Absolute 0.4  0.1 - 1.0 (K/uL)   Eosinophils Relative 0  0 - 5 (%)   Eosinophils Absolute 0.0  0.0 - 0.7 (K/uL)   Basophils Relative 0  0 - 1 (%)   Basophils Absolute 0.0  0.0 - 0.1 (K/uL)  BASIC METABOLIC PANEL      Component Value Range   Sodium 134 (*) 135 - 145 (mEq/L)   Potassium 4.2  3.5 - 5.1 (mEq/L)   Chloride 97  96 - 112 (mEq/L)   CO2 27  19 - 32 (mEq/L)   Glucose, Bld 289 (*) 70 - 99 (mg/dL)   BUN 13  6 - 23 (mg/dL)   Creatinine, Ser 7.82  0.50 - 1.10 (mg/dL)   Calcium 95.6  8.4 - 10.5 (mg/dL)   GFR calc non Af Amer 72 (*) >90 (mL/min)   GFR calc Af Amer 84 (*) >90 (mL/min)  URINALYSIS, ROUTINE W REFLEX MICROSCOPIC      Component Value Range   Color, Urine YELLOW  YELLOW    APPearance CLOUDY (*) CLEAR  Specific Gravity, Urine 1.034 (*) 1.005 - 1.030    pH 5.5  5.0 - 8.0    Glucose, UA >1000 (*) NEGATIVE (mg/dL)   Hgb urine dipstick TRACE (*) NEGATIVE    Bilirubin Urine NEGATIVE  NEGATIVE    Ketones, ur 15 (*) NEGATIVE (mg/dL)   Protein, ur 161 (*) NEGATIVE (mg/dL)   Urobilinogen, UA 0.2  0.0 - 1.0 (mg/dL)   Nitrite NEGATIVE  NEGATIVE    Leukocytes, UA NEGATIVE  NEGATIVE   URINE MICROSCOPIC-ADD ON      Component Value Range   Squamous Epithelial / LPF MANY (*) RARE    WBC, UA 0-2  <3 (WBC/hpf)   RBC / HPF 3-6  <3 (RBC/hpf)   Bacteria, UA RARE  RARE    Casts GRANULAR CAST (*) NEGATIVE      Dg Chest 2 View  10/25/2011  *RADIOLOGY REPORT*  Clinical Data: Fever, cough, chills, and body aches.  History of smoking and diabetes.  CHEST - 2 VIEW  Comparison: Chest radiograph performed 12/28/2010  Findings: The lungs are well-aerated.  There is stable left-sided pleural thickening; on comparison with earlier studies, this is suspected to reflect epicardial fat, though a small left pleural  effusion cannot be excluded.  No definite pleural effusion is characterized on the lateral view.  Mild bilateral linear atelectasis is seen.  There is no evidence of pneumothorax.  The heart is normal in size; calcification is noted within the aortic arch.  No acute osseous abnormalities are seen.  Diffuse calcification is noted along the proximal abdominal aorta.  IMPRESSION:  1.  Stable left-sided pleural thickening; on comparison with earlier studies, this is suspected to reflect epicardial fat, though a persistent small left pleural effusion cannot be excluded. No definite pleural effusion seen on the lateral view. 2.  Mild bilateral linear atelectasis seen. 3.  Diffuse calcification along the proximal abdominal aorta.  Original Report Authenticated By: Tonia Ghent, M.D.   Ct Head Wo Contrast  10/25/2011  *RADIOLOGY REPORT*  Clinical Data: Altered mental status with headaches and fever.  CT HEAD WITHOUT CONTRAST  Technique:  Contiguous axial images were obtained from the base of the skull through the vertex without contrast.  Comparison: Sinus CT 12/14/2007.  Findings: There is no evidence of acute intracranial hemorrhage, mass lesion, brain edema or extra-axial fluid collection.  The ventricles and subarachnoid spaces are appropriately sized for age. There is no CT evidence of acute cortical infarction.  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 There is no hydrocephalus.  The visualized paranasal sinuses are clear. The calvarium is intact.  IMPRESSION:  1.  No acute intracranial or calvarial findings. 2.  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  Original Report Authenticated By: Gerrianne Scale, M.D.     1. Influenza   2. Hyperglycemia   3. Fever       MDM  8:15 PM patient seen and evaluated. Patient in no acute distress.  Patient currently without complaints of confusion. Patient awake and alert oriented x3. Patient with normal nonfocal neuro exam. Patient with no meningeal signs.   Patient discussed with attending physician. He agrees with workup and treatment plan. Patient currently with no confusion or abnormal neural exam. Patient and family have vague history of possible confusion at home. This is more than likely related to patient's fever and other symptoms versus TIA.  Patient's chest x-ray is without signs of pneumonia, patient has normal WBC count. At this time suspect viral process and possible flu. Will refer patient home with close followup with primary care.     Angus Seller, [place] 10/26/11 [phone]

## 2011-10-26 NOTE — ED Provider Notes (Signed)
Medical screening examination/treatment/procedure(s) were conducted as a shared visit with non-physician practitioner(s) and myself.  I personally evaluated the patient during the encounter   Joya Gaskins, MD 10/26/11 2204

## 2011-10-30 ENCOUNTER — Emergency Department (HOSPITAL_COMMUNITY): Payer: Medicare Other

## 2011-10-30 ENCOUNTER — Emergency Department (HOSPITAL_COMMUNITY)
Admission: EM | Admit: 2011-10-30 | Discharge: 2011-10-31 | Disposition: A | Discharge status: Home / Self Care | Payer: Medicare Other | Attending: Emergency Medicine | Admitting: Emergency Medicine

## 2011-10-30 ENCOUNTER — Encounter (HOSPITAL_COMMUNITY): Payer: Self-pay | Admitting: *Deleted

## 2011-10-30 DIAGNOSIS — E119 Type 2 diabetes mellitus without complications: Secondary | ICD-10-CM | POA: Insufficient documentation

## 2011-10-30 DIAGNOSIS — I2510 Atherosclerotic heart disease of native coronary artery without angina pectoris: Secondary | ICD-10-CM | POA: Insufficient documentation

## 2011-10-30 DIAGNOSIS — J441 Chronic obstructive pulmonary disease with (acute) exacerbation: Secondary | ICD-10-CM | POA: Insufficient documentation

## 2011-10-30 DIAGNOSIS — R062 Wheezing: Secondary | ICD-10-CM | POA: Insufficient documentation

## 2011-10-30 DIAGNOSIS — I10 Essential (primary) hypertension: Secondary | ICD-10-CM | POA: Insufficient documentation

## 2011-10-31 MED ORDER — ALBUTEROL SULFATE HFA 108 (90 BASE) MCG/ACT IN AERS: 2.0000 | INHALATION_SPRAY | RESPIRATORY_TRACT | Status: DC | PRN

## 2011-10-31 MED ORDER — IPRATROPIUM BROMIDE 0.02 % IN SOLN
0.5000 mg | Freq: Once | RESPIRATORY_TRACT | Status: AC
  Administered 2011-10-31: 0.5 mg via RESPIRATORY_TRACT
  Filled 2011-10-31: qty 2.5

## 2011-10-31 MED ORDER — PREDNISONE 20 MG PO TABS
60.0000 mg | ORAL_TABLET | Freq: Once | ORAL | Status: AC
  Administered 2011-10-31: 60 mg via ORAL
  Filled 2011-10-31: qty 3

## 2011-10-31 MED ORDER — PREDNISONE 10 MG PO TABS: 60.0000 mg | ORAL_TABLET | Freq: Every day | ORAL | Status: DC

## 2011-10-31 MED ORDER — ALBUTEROL SULFATE (5 MG/ML) 0.5% IN NEBU
5.0000 mg | INHALATION_SOLUTION | Freq: Once | RESPIRATORY_TRACT | Status: AC
  Administered 2011-10-31: 5 mg via RESPIRATORY_TRACT
  Filled 2011-10-31: qty 1

## 2011-10-31 NOTE — ED Provider Notes (Signed)
History     CSN: [id number] Arrival date & time: 10/30/2011  7:34 PM   First MD Initiated Contact with Patient 10/30/11 2349      Chief Complaint  Patient presents with  . Cough    pt recently diagnosed with bronchitis, and coughing spells until "I almost pass out."     (Consider location/radiation/quality/duration/timing/severity/associated sxs/prior treatment) HPI The patient reports coughing congestion for approximately 3 or 4 days.  She has a history of COPD not home oxygen.  She has not tried her breathing treatments today.  She reports a recent chest x-ray by her primary care Dr. and was told she had bronchitis.  Today she reports a significant coughing spell that led to her "almost passing out".  At this time she reports mild wheezing without significant shortness of breath.  She denies chest pain.  She denies palpitations.  She's had no fever or chills.  She denies abdominal pain.  She denies nausea vomiting diarrhea.  She denies melena.  She has no prior history of DVT or PE.  Nothing worsens her symptoms.  Nothing improves her symptoms.  Her symptoms are constant  Past Medical History  Diagnosis Date  . CAD (coronary artery disease)     s/p inferior STEMI 12/29/10 with rotablator atherectomy RCA 12/31/10 and DES x 2 RCA  . Ischemic cardiomyopathy     EF 45% by cath 12/29/10.   . Diabetes mellitus     type 2  . HTN (hypertension)   . Hyperlipidemia   . Diverticulosis   . Asthma   . COPD (chronic obstructive pulmonary disease)   . Small bowel obstruction     from a sigmoid stricture  . Nephrolithiasis   . Sleep apnea   . Overweight   . Osteoarthritis   . GERD (gastroesophageal reflux disease)   . Anxiety   . Glaucoma   . History of colonoscopy     Past Surgical History  Procedure Date  . Bilateral tubal ligation   . Lithotripsy   . 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 Cardiac surgery     Family History  Problem Relation Age of  Onset  . Breast cancer Maternal Grandmother   . Diabetes Father   . Diabetes Sister   . Heart disease Father   . Colon cancer Neg Hx     History  Substance Use Topics  . Smoking status: Former Smoker -- 1.0 packs/day for 25 years    Types: Cigarettes    Quit date: 10/20/2004  . Smokeless tobacco: Never Used   Comment: over a ppd for 40+ years; quit 10/2004  . Alcohol Use: No     rare     OB History    Grav Para Term Preterm Abortions TAB SAB Ect Mult Living                  Review of Systems  All other systems reviewed and are negative.    Allergies  Erythromycin; Lisinopril; Penicillins; Pentazocine lactate; Propoxyphene hcl; Quinapril hcl; and Sulfonamide derivatives  Home Medications   Current Outpatient Rx  Name Route Sig Dispense Refill  . AMLODIPINE BESYLATE 2.5 MG PO TABS Oral Take 2.5 mg by mouth daily.      . ASPIRIN 81 MG PO TABS Oral Take 81 mg by mouth daily.      . CYCLOBENZAPRINE HCL 10 MG PO TABS Oral Take 5-10 mg by mouth 3 (three) times daily as needed. For muscle spasms.    Marland Kitchen  EZETIMIBE 10 MG PO TABS Oral Take 10 mg by mouth daily.      Marland Kitchen GLIPIZIDE 5 MG PO TABS Oral Take 5 mg by mouth daily.      . JENTADUETO 2.03-999 MG PO TABS Oral Take 1 tablet by mouth Twice daily.    Marland Kitchen METOPROLOL TARTRATE 25 MG PO TABS Oral Take 25 mg by mouth 2 (two) times daily.      Marland Kitchen POLYETHYLENE GLYCOL 3350 PO POWD Oral Take 17 g by mouth daily.      Marland Kitchen PRASUGREL HCL 10 MG PO TABS Oral Take 10 mg by mouth daily.     Marland Kitchen PREGABALIN 75 MG PO CAPS Oral Take 75 mg by mouth 2 (two) times daily.      Marland Kitchen ROSUVASTATIN CALCIUM 40 MG PO TABS Oral Take 40 mg by mouth daily.      Marland Kitchen TIMOLOL HEMIHYDRATE 0.5 % OP SOLN Right Eye Place 1 drop into the right eye 2 (two) times daily.      Marland Kitchen TIZANIDINE HCL 2 MG PO CAPS Oral Take 2-4 mg by mouth every 6 (six) hours.     . TOLTERODINE TARTRATE 4 MG PO CP24 Oral Take 4 mg by mouth daily.      . TRAVOPROST 0.004 % OP SOLN Both Eyes Place 1 drop into  both eyes at bedtime.      . ALBUTEROL SULFATE (2.5 MG/3ML) 0.083% IN NEBU Nebulization Take 2.5 mg by nebulization every 6 (six) hours as needed. For shortness of breath.    . ALBUTEROL 90 MCG/ACT IN AERS Inhalation Inhale 2 puffs into the lungs 2 (two) times daily.      Marland Kitchen FLUTICASONE-SALMETEROL 250-50 MCG/DOSE IN AEPB Inhalation Inhale 1 puff into the lungs 2 (two) times daily.      Marland Kitchen NITROGLYCERIN 0.4 MG SL SUBL Sublingual Place 0.4 mg under the tongue every 5 (five) minutes as needed. For chest pain.      BP 128/77  Pulse 91  Temp(Src) 97.9 F (36.6 C) (Oral)  Resp 22  SpO2 91%  Physical Exam  Nursing note and vitals reviewed. Constitutional: She is oriented to person, place, and time. She appears well-developed and well-nourished. No distress.  HENT:  Head: Normocephalic and atraumatic.  Eyes: EOM are normal.  Neck: Normal range of motion.  Cardiovascular: Normal rate, regular rhythm and normal heart sounds.   Pulmonary/Chest: Effort normal. She has wheezes.  Abdominal: Soft. She exhibits no distension. There is no tenderness.  Musculoskeletal: Normal range of motion.  Neurological: She is alert and oriented to person, place, and time.  Skin: Skin is warm and dry.  Psychiatric: She has a normal mood and affect. Judgment normal.    ED Course  Procedures (including critical care time)  Labs Reviewed - No data to display Dg Chest 2 View  10/30/2011  *RADIOLOGY REPORT*  Clinical Data: Cough.  History of asthma and COPD.  CHEST - 2 VIEW  Comparison: Chest radiograph 10/25/2011.  Chest CT 12/14/2007.  Findings: Stable cardiomegaly.  Prominent epicardial fat pad is stable. The lungs are well expanded and clear.  There is no pleural effusion, airspace disease, or pneumothorax.  Probable coronary artery calcifications are seen on the lateral view.  No acute bony abnormality.  IMPRESSION:  1.  Stable cardiomegaly.  Visible coronary artery atherosclerotic calcification. 2.  No acute  findings.  Original Report Authenticated By: Britta Mccreedy, M.D.     1. COPD exacerbation    MDM  Significant improvement in her  breathing after albuterol and Atrovent in the emergency department.  She's given a dose of prednisone.  This appears to be a COPD exacerbation.  The patient would like to be discharged home at this time.  She understands to follow up closely with her doctor.        Lyanne Co, MD 10/31/11 [phone]

## 2011-10-31 NOTE — ED Notes (Signed)
Up to bathroom-back to trt room--states "I don't think Ineed to be on that 02---and I am ready to go home."  Encouraged her to wait for discharge papers.

## 2011-10-31 NOTE — ED Notes (Signed)
C/o persistent cough, runny nose and "rattling" in chest onset 4-5 days ago.  Has been on med but no improvement.  (Husband just passed a few days ago)

## 2011-10-31 NOTE — ED Notes (Signed)
Tolerated nebsbut, 02 sat is now 88-91--Placed on 02 at 2 liters---pulse ox 94%

## 2011-11-02 ENCOUNTER — Telehealth: Payer: Self-pay | Admitting: Internal Medicine

## 2011-11-02 MED ORDER — FLUTICASONE-SALMETEROL 250-50 MCG/DOSE IN AEPB: 1.0000 | INHALATION_SPRAY | Freq: Two times a day (BID) | RESPIRATORY_TRACT | Status: DC

## 2011-11-02 NOTE — Telephone Encounter (Signed)
Pt last saw MR 09/20/11 and was told to f/u in 9 months.  Called and spoke with pt. Pt states she believes someone at home accidentally threw away her advair and is therefore requesting a new rx be called into the pharmacy.  Informed pt rx sent to pharmacy.

## 2012-02-05 ENCOUNTER — Other Ambulatory Visit: Payer: Self-pay | Admitting: Cardiovascular Disease

## 2012-03-28 ENCOUNTER — Other Ambulatory Visit: Payer: Self-pay

## 2012-03-28 DIAGNOSIS — M79609 Pain in unspecified limb: Secondary | ICD-10-CM

## 2012-03-28 DIAGNOSIS — R0989 Other specified symptoms and signs involving the circulatory and respiratory systems: Secondary | ICD-10-CM

## 2012-05-09 ENCOUNTER — Encounter: Payer: Medicare Other | Admitting: Vascular Surgery

## 2012-05-13 ENCOUNTER — Encounter: Payer: Self-pay | Admitting: Vascular Surgery

## 2012-05-14 ENCOUNTER — Encounter: Payer: Self-pay | Admitting: Vascular Surgery

## 2012-05-14 ENCOUNTER — Ambulatory Visit (INDEPENDENT_AMBULATORY_CARE_PROVIDER_SITE_OTHER): Payer: Medicare Other | Admitting: Vascular Surgery

## 2012-05-14 VITALS — BP 112/46 | HR 62 | Resp 18 | Ht 65.0 in | Wt 169.0 lb

## 2012-05-14 DIAGNOSIS — I70219 Atherosclerosis of native arteries of extremities with intermittent claudication, unspecified extremity: Secondary | ICD-10-CM | POA: Insufficient documentation

## 2012-05-14 DIAGNOSIS — I739 Peripheral vascular disease, unspecified: Secondary | ICD-10-CM

## 2012-05-14 DIAGNOSIS — M79609 Pain in unspecified limb: Secondary | ICD-10-CM

## 2012-05-14 DIAGNOSIS — M79606 Pain in leg, unspecified: Secondary | ICD-10-CM | POA: Insufficient documentation

## 2012-05-14 NOTE — Procedures (Unsigned)
LOWER EXTREMITY ARTERIAL DUPLEX  INDICATION:  Right lower extremity pain, peripheral vascular disease  HISTORY: Diabetes:  Yes Cardiac:  No Hypertension:  Yes Smoking:  Previous Previous Surgery:  No lower extremity arterial intervention  SINGLE LEVEL ARTERIAL EXAM                         RIGHT                LEFT Brachial: Anterior tibial: Posterior tibial: Peroneal: Ankle/Brachial Index:   1.08                 1.16  LOWER EXTREMITY ARTERIAL DUPLEX EXAM  DUPLEX:  No hemodynamically significant plaque is identified involving the right lower extremity arterial system.  IMPRESSION: 1. Normal multiphasic Doppler waveforms present involving the right     lower extremity arterial system. 2. Bilateral ankle brachial indices are >0.95 and are considered in     the normal range.  ___________________________________________ Quita Skye. Hart Rochester, M.D.  SH/MEDQ  D:  05/14/2012  T:  05/14/2012  Job:  [id number]

## 2012-05-14 NOTE — Progress Notes (Signed)
Subjective:     Patient ID: [name], female   DOB: Apr 07, 1941, 71 y.o.   MRN: [id number]  HPI this 71 year old female patient was self referred because of lower extremity discomfort. She states that she has pain in both legs left worse than right. This generally occurs while standing or sometimes sitting. Walking will frequently make the pain better. She has no history of infection, gangrene, saline this, or nonhealing ulcers. She does have some neuropathy. She has had back and neck problems. She has 2 "pinched nerves". She describes the pain as an aching pain in the thigh extending down into the calf which is transient in nature. She has no history of DVT or thrombophlebitis.  / Past Medical History  Diagnosis Date  . CAD (coronary artery disease)     s/p inferior STEMI 12/29/10 with rotablator atherectomy RCA 12/31/10 and DES x 2 RCA  . Ischemic cardiomyopathy     EF 45% by cath 12/29/10.   . Diabetes mellitus     type 2  . HTN (hypertension)   . Hyperlipidemia   . Diverticulosis   . Asthma   . COPD (chronic obstructive pulmonary disease)   . Small bowel obstruction     from a sigmoid stricture  . Nephrolithiasis   . Sleep apnea   . Overweight   . Osteoarthritis   . GERD (gastroesophageal reflux disease)   . Anxiety   . Glaucoma   . History of colonoscopy     History  Substance Use Topics  . Smoking status: Former Smoker -- 1.0 packs/day for 25 years    Types: Cigarettes    Quit date: 10/20/2004  . Smokeless tobacco: Never Used   Comment: over a ppd for 40+ years; quit 10/2004  . Alcohol Use: No     rare     Family History  Problem Relation Age of Onset  . Breast cancer Maternal Grandmother   . Diabetes Father   . Diabetes Sister   . Heart disease Father   . Colon cancer Neg Hx     Allergies  Allergen Reactions  . Erythromycin   . Lisinopril     REACTION: respiratory arrest jan 2009  . Penicillins   . Pentazocine Lactate   . Propoxyphene Hcl   .  Quinapril Hcl   . Sulfonamide Derivatives     Current outpatient prescriptions:albuterol (PROVENTIL HFA;VENTOLIN HFA) 108 (90 BASE) MCG/ACT inhaler, Inhale 2 puffs into the lungs every 4 (four) hours as needed for wheezing., Disp: 1 Inhaler, Rfl: 0;  albuterol (PROVENTIL) (2.5 MG/3ML) 0.083% nebulizer solution, Take 2.5 mg by nebulization every 6 (six) hours as needed. For shortness of breath., Disp: , Rfl:  albuterol (PROVENTIL,VENTOLIN) 90 MCG/ACT inhaler, Inhale 2 puffs into the lungs 2 (two) times daily.  , Disp: , Rfl: ;  amLODipine (NORVASC) 2.5 MG tablet, Take 2.5 mg by mouth daily.  , Disp: , Rfl: ;  aspirin 81 MG tablet, Take 81 mg by mouth daily.  , Disp: , Rfl: ;  cyclobenzaprine (FLEXERIL) 10 MG tablet, Take 5-10 mg by mouth 3 (three) times daily as needed. For muscle spasms., Disp: , Rfl:  EFFIENT 10 MG TABS, TAKE 1 TABLET BY MOUTH DAILY, Disp: 30 tablet, Rfl: 5;  ezetimibe (ZETIA) 10 MG tablet, Take 10 mg by mouth daily.  , Disp: , Rfl: ;  Fluticasone-Salmeterol (ADVAIR) 250-50 MCG/DOSE AEPB, Inhale 1 puff into the lungs 2 (two) times daily., Disp: 60 each, Rfl: 9;  glipiZIDE (GLUCOTROL) 5 MG  tablet, Take 5 mg by mouth daily.  , Disp: , Rfl: ;  JENTADUETO 2.03-999 MG TABS, Take 1 tablet by mouth Twice daily., Disp: , Rfl:  metoprolol tartrate (LOPRESSOR) 25 MG tablet, Take 25 mg by mouth 2 (two) times daily.  , Disp: , Rfl: ;  nitroGLYCERIN (NITROSTAT) 0.4 MG SL tablet, Place 0.4 mg under the tongue every 5 (five) minutes as needed. For chest pain., Disp: , Rfl: ;  polyethylene glycol powder (MIRALAX) powder, Take 17 g by mouth daily.  , Disp: , Rfl: ;  predniSONE (DELTASONE) 10 MG tablet, Take 6 tablets (60 mg total) by mouth daily., Disp: 30 tablet, Rfl: 0 pregabalin (LYRICA) 75 MG capsule, Take 75 mg by mouth 2 (two) times daily.  , Disp: , Rfl: ;  rosuvastatin (CRESTOR) 40 MG tablet, Take 40 mg by mouth daily.  , Disp: , Rfl: ;  timolol (BETIMOL) 0.5 % ophthalmic solution, Place 1 drop into  the right eye 2 (two) times daily.  , Disp: , Rfl: ;  tizanidine (ZANAFLEX) 2 MG capsule, Take 2-4 mg by mouth every 6 (six) hours. , Disp: , Rfl:  tolterodine (DETROL LA) 4 MG 24 hr capsule, Take 4 mg by mouth daily.  , Disp: , Rfl: ;  travoprost, benzalkonium, (TRAVATAN) 0.004 % ophthalmic solution, Place 1 drop into both eyes at bedtime.  , Disp: , Rfl: ;  DISCONTD: Fluticasone-Salmeterol (ADVAIR DISKUS) 250-50 MCG/DOSE AEPB, Inhale 1 puff into the lungs 2 (two) times daily., Disp: 60 each, Rfl: 8  BP 112/46  Pulse 62  Resp 18  Ht 5\' 5"  (1.651 m)  Wt 169 lb (76.658 kg)  BMI 28.12 kg/m2  Body mass index is 28.12 kg/(m^2).         Review of Systems denies chest pain, orthopnea, hemoptysis. Does complain of dyspnea on exertion, chronic abdominal discomfort, problems with her joints.     Objective:   Physical Exam blood pressure 112/46 heart rate 60 respirations 18 Gen.-alert and oriented x3 in no apparent distress HEENT normal for age Lungs no rhonchi or wheezing Cardiovascular regular rhythm no murmurs carotid pulses 3+ palpable no bruits audible Abdomen soft nontender no palpable masses-obese Musculoskeletal free of  major deformities Skin clear -no rashes Neurologic normal Lower extremities 3+ femoral and dorsalis pedis pulses palpable bilaterally with no edema Diffuse spider veins on the medial aspect of both thighs distally. No bulging varicosities. No hyperpigmentation or ulceration.  Today I ordered lower extremity arterial pressures with a scan of the right lower extremity. There is no evidence of obstruction or narrowing. ABI is 1.08 on the right and 1.16 on the left      Assessment:     Bilateral leg pain no evidence of arterial or venous insufficiency    Plan:     No further vascular evaluation indicated

## 2012-06-24 ENCOUNTER — Encounter: Payer: Self-pay | Admitting: Cardiovascular Disease

## 2012-09-13 ENCOUNTER — Other Ambulatory Visit: Payer: Self-pay | Admitting: *Deleted

## 2012-09-13 MED ORDER — ROSUVASTATIN CALCIUM 40 MG PO TABS: 40.0000 mg | ORAL_TABLET | Freq: Every day | ORAL | Status: DC

## 2012-10-04 ENCOUNTER — Other Ambulatory Visit: Payer: Self-pay | Admitting: *Deleted

## 2012-10-04 MED ORDER — PRASUGREL HCL 10 MG PO TABS: 10.0000 mg | ORAL_TABLET | Freq: Every day | ORAL | Status: DC

## 2012-10-04 NOTE — Telephone Encounter (Signed)
Pt needs appointment then refill can be made Fax Received. Refill Completed. Stanisha Lorenz Chowoe (R.M.A)   

## 2012-10-30 ENCOUNTER — Ambulatory Visit (INDEPENDENT_AMBULATORY_CARE_PROVIDER_SITE_OTHER): Payer: Medicare Other | Admitting: Physician Assistant

## 2012-10-30 ENCOUNTER — Telehealth: Payer: Self-pay | Admitting: *Deleted

## 2012-10-30 ENCOUNTER — Encounter: Payer: Self-pay | Admitting: Physician Assistant

## 2012-10-30 VITALS — BP 138/78 | HR 108 | Ht 65.0 in | Wt 140.4 lb

## 2012-10-30 DIAGNOSIS — R002 Palpitations: Secondary | ICD-10-CM

## 2012-10-30 DIAGNOSIS — R079 Chest pain, unspecified: Secondary | ICD-10-CM

## 2012-10-30 DIAGNOSIS — I10 Essential (primary) hypertension: Secondary | ICD-10-CM

## 2012-10-30 DIAGNOSIS — R000 Tachycardia, unspecified: Secondary | ICD-10-CM

## 2012-10-30 DIAGNOSIS — I2510 Atherosclerotic heart disease of native coronary artery without angina pectoris: Secondary | ICD-10-CM

## 2012-10-30 DIAGNOSIS — I498 Other specified cardiac arrhythmias: Secondary | ICD-10-CM

## 2012-10-30 LAB — BASIC METABOLIC PANEL
BUN: 13 mg/dL (ref 6–23)
Calcium: 9 mg/dL (ref 8.4–10.5)
Creatinine, Ser: 1.1 mg/dL (ref 0.4–1.2)
GFR: 53.73 mL/min — ABNORMAL LOW (ref >60.00)

## 2012-10-30 LAB — TSH: TSH: 1.82 u[IU]/mL (ref 0.35–5.50)

## 2012-10-30 LAB — CBC WITH DIFFERENTIAL/PLATELET
Basophils Absolute: 0 10*3/uL (ref 0.0–0.1)
Eosinophils Relative: 0.8 % (ref 0.0–5.0)
Monocytes Relative: 4.1 % (ref 3.0–12.0)
Neutrophils Relative %: 70.3 % (ref 43.0–77.0)
Platelets: 184 10*3/uL (ref 150.0–400.0)
WBC: 10.7 10*3/uL — ABNORMAL HIGH (ref 4.5–10.5)

## 2012-10-30 NOTE — Telephone Encounter (Signed)
Message copied by Tarri Fuller on Wed Oct 30, 2012  5:52 PM ------      Message from: [place], [place] T      Created: Wed Oct 30, 2012  3:45 PM       K+ ok.      Creatinine normal.      Hgb ok.      TSH ok.      WBC insignificantly elevated.      Sugar way too high - follow up with PCP.      Tereso Newcomer, PA-C  3:44 PM 10/30/2012

## 2012-10-30 NOTE — Progress Notes (Signed)
[address], [address] [place], [place]  [address] Phone: [phone], Fax:  [phone]  Date:  10/30/2012   Name:  [name]   DOB:  November 21, 1940   MRN:  [id number]  PCP:  [name], MD  Primary Cardiologist:  Dr. Verne Carrow  Primary Electrophysiologist:  None    History of Present Illness: [name] is a 71 y.o. female who returns for evaluation of palpitations.  She has a hx of DM2, HTN, HL, tobacco abuse and CAD.  She was admitted in 12/28/10 with an acute inferior STEMI. Emergent cath on 12/29/10 with serial lesions in subtotally occluded RCA. Dr. Verne Carrow was unable to stent the vessel during the index procedure because of severe calcification in the mid vessel. Flow was established with balloon inflations and she was treated with Integrilin, Effient and ASA.  She had staged PCI 12/31/10 at which time a rotablator atherectomy of the mid RCA was performed. She did well with both procedures. Two overlapping drug eluting stents were placed in the mid RCA. EF was found to be 45 % at the time of the cath. There was mild disease in the LAD and the Circumflex arteries.  Echo 5/12: EF 55-60%, grade 1 diastolic dysfunction, mild MR, mild LAE. Last seen by Dr. Verne Carrow 5/12.   She reports occasional palpitations. She also reports occasional chest pains. She describes this as heavy with more extreme activities. She has noted these off and on since her MI. They do not seem to be more frequent or worse. She denies any symptoms reminiscent of her previous MI. She notes dyspnea with exertion. This is overall stable and she describes class II-IIb symptoms. She does note that she is breathing much better since losing 40 pounds by dieting. She denies syncope or near-syncope. She denies orthopnea or PND or pedal edema. She was under significant emotional stress yesterday. It sounds as though she broke up with her boyfriend. She was quite upset  and crying for most of the day. She notes worsening palpitations with this. She denies any chest pain. She felt as though that she may have been having more trouble breathing. She was concerned about her increasing palpitations and decided to come in today for evaluation.  Labs (12/12):    K 4.2, creatinine 0.81, Hgb 13.8, platelet count 112,000  Wt Readings from Last 3 Encounters:  10/30/12 140 lb 6.4 oz (63.685 kg)  05/14/12 169 lb (76.658 kg)  09/20/11 178 lb 3.2 oz (80.831 kg)     Past Medical History  Diagnosis Date  . CAD (coronary artery disease)     s/p inferior STEMI 12/29/10 with rotablator atherectomy RCA 12/31/10 and DES x 2 RCA  . Ischemic cardiomyopathy     EF 45% by cath 12/29/10.   . Diabetes mellitus     type 2  . HTN (hypertension)   . Hyperlipidemia   . Diverticulosis   . Asthma   . COPD (chronic obstructive pulmonary disease)   . Small bowel obstruction     from a sigmoid stricture  . Nephrolithiasis   . Sleep apnea   . Overweight   . Osteoarthritis   . GERD (gastroesophageal reflux disease)   . Anxiety   . Glaucoma(365)   . History of colonoscopy     Current Outpatient Prescriptions  Medication Sig Dispense Refill  . albuterol (PROVENTIL HFA;VENTOLIN HFA) 108 (90 BASE) MCG/ACT inhaler Inhale 2 puffs into the lungs every 4 (four) hours as needed  for wheezing.  1 Inhaler  0  . albuterol (PROVENTIL) (2.5 MG/3ML) 0.083% nebulizer solution Take 2.5 mg by nebulization every 6 (six) hours as needed. For shortness of breath.      Marland Kitchen amLODipine (NORVASC) 2.5 MG tablet Take 2.5 mg by mouth daily.        . cyclobenzaprine (FLEXERIL) 10 MG tablet Take 5-10 mg by mouth 3 (three) times daily as needed. For muscle spasms.      Marland Kitchen ezetimibe (ZETIA) 10 MG tablet Take 10 mg by mouth daily.        . Fluticasone-Salmeterol (ADVAIR) 250-50 MCG/DOSE AEPB Inhale 1 puff into the lungs 2 (two) times daily.  60 each  9  . glipiZIDE (GLUCOTROL) 5 MG tablet Take 5 mg by mouth daily.         . JENTADUETO 2.03-999 MG TABS Take 1 tablet by mouth Twice daily.      . meperidine (DEMEROL) 50 MG tablet Take 50 mg by mouth every 4 (four) hours as needed.      . metoprolol tartrate (LOPRESSOR) 25 MG tablet Take 25 mg by mouth 2 (two) times daily.        . nitroGLYCERIN (NITROSTAT) 0.4 MG SL tablet Place 0.4 mg under the tongue every 5 (five) minutes as needed. For chest pain.      Marland Kitchen oxyCODONE-acetaminophen (PERCOCET/ROXICET) 5-325 MG per tablet Take 1 tablet by mouth every 6 (six) hours as needed.      . prasugrel (EFFIENT) 10 MG TABS Take 1 tablet (10 mg total) by mouth daily.  30 tablet  0  . predniSONE (DELTASONE) 10 MG tablet Take 6 tablets (60 mg total) by mouth daily.  30 tablet  0  . pregabalin (LYRICA) 75 MG capsule Take 75 mg by mouth 2 (two) times daily.        . rosuvastatin (CRESTOR) 40 MG tablet Take 1 tablet (40 mg total) by mouth daily.  30 tablet  0  . timolol (BETIMOL) 0.5 % ophthalmic solution Place 1 drop into the right eye 2 (two) times daily.        . tizanidine (ZANAFLEX) 2 MG capsule Take 2-4 mg by mouth every 6 (six) hours.       Marland Kitchen tolterodine (DETROL LA) 4 MG 24 hr capsule Take 4 mg by mouth daily.        . travoprost, benzalkonium, (TRAVATAN) 0.004 % ophthalmic solution Place 1 drop into both eyes at bedtime.        Marland Kitchen aspirin 81 MG tablet Take 81 mg by mouth daily.        . polyethylene glycol powder (MIRALAX) powder Take 17 g by mouth daily.          Allergies: Allergies  Allergen Reactions  . Erythromycin   . Lisinopril     REACTION: respiratory arrest jan 2009  . Penicillins   . Pentazocine Lactate   . Propoxyphene Hcl   . Quinapril Hcl   . Sulfonamide Derivatives     Social History:  The patient  reports that she quit smoking about 8 years ago. Her smoking use included Cigarettes. She has a 25 pack-year smoking history. She has never used smokeless tobacco. She reports that she drinks about .5 ounces of alcohol per week. She reports that she does  not use illicit drugs.   ROS:  Please see the history of present illness.   All other systems reviewed and negative.   PHYSICAL EXAM: VS:  BP 138/78  Pulse  108  Ht 5\' 5"  (1.651 m)  Wt 140 lb 6.4 oz (63.685 kg)  BMI 23.36 kg/m2 Well nourished, well developed, in no acute distress HEENT: normal Neck: no JVD Endocrine: No thyromegaly  Cardiac:  normal S1, S2; RRR; no murmur Lungs:  clear to auscultation bilaterally, no wheezing, rhonchi or rales Abd: soft, nontender, no hepatomegaly Ext: no edema Skin: warm and dry Neuro:  CNs 2-12 intact, no focal abnormalities noted  EKG:  Sinus tachycardia, HR 108, nonspecific ST-T wave changes, no significant change compared to prior tracing       ASSESSMENT AND PLAN:  1.  Palpitations:   I believe that her palpitations are more than likely all related to significant emotional upset yesterday. However, she has noted these over time. I do not think that she is experiencing significant arrhythmias. I suspect she is probably having PVCs or PACs. In any event, if she were to have atrial fibrillation, she would require anticoagulation. Therefore, I will set her up for an event monitor to rule out significant arrhythmia. I will check a CBC, basic metabolic panel and TSH today. Plan followup In 4-6 Weeks  2. Chest Pain:   Her symptoms are stable over the last 2 years without significant change. No significant changes on her ECG. At this point, I do not believe that she needs any further ischemic evaluation. We discussed monitoring her symptoms. If she notes any worsening, she will contact us for sooner follow up.  3. Coronary Artery Disease:  She misses her aspirin from time to time. I asked her to restart this. Continue Effient and statin.  4. Hypertension:   Controlled.  5. Sinus Tachycardia:   This is likely related to recent emotional upset as well as missing her medications yesterday. She will continue to take her medications as scheduled. We'll  check a TSH as noted above.  6. Disposition:   Followup with Dr. Verne Carrow in 4-6 weeks.   SignedTereso Newcomer, PA-C  11:59 AM 10/30/2012

## 2012-10-30 NOTE — Patient Instructions (Addendum)
LAB TODAY, BMET, CBC W/DIFF, TSH  EVENT MONITOR DX 785.1, 786.50  PLEASE FOLLOW UP WITH DR. Clifton James 4-6 WEEKS

## 2012-10-30 NOTE — Telephone Encounter (Signed)
lmom to go over lab results

## 2012-10-31 NOTE — Telephone Encounter (Signed)
lmom x 2 about lab results, wanted to make sure pt knows to f/u with PCP due to 428 glucose level, other labs ok

## 2012-11-08 ENCOUNTER — Encounter (INDEPENDENT_AMBULATORY_CARE_PROVIDER_SITE_OTHER): Payer: Medicare Other

## 2012-11-08 DIAGNOSIS — R002 Palpitations: Secondary | ICD-10-CM

## 2012-11-08 DIAGNOSIS — R079 Chest pain, unspecified: Secondary | ICD-10-CM

## 2012-11-08 DIAGNOSIS — I2510 Atherosclerotic heart disease of native coronary artery without angina pectoris: Secondary | ICD-10-CM

## 2012-11-11 ENCOUNTER — Other Ambulatory Visit: Payer: Self-pay | Admitting: Cardiovascular Disease

## 2012-11-15 ENCOUNTER — Encounter: Payer: Self-pay | Admitting: Internal Medicine

## 2012-12-18 ENCOUNTER — Other Ambulatory Visit: Payer: Self-pay | Admitting: Family Medicine

## 2012-12-18 DIAGNOSIS — M545 Low back pain, unspecified: Secondary | ICD-10-CM

## 2012-12-18 DIAGNOSIS — M21379 Foot drop, unspecified foot: Secondary | ICD-10-CM

## 2012-12-19 ENCOUNTER — Ambulatory Visit (INDEPENDENT_AMBULATORY_CARE_PROVIDER_SITE_OTHER): Payer: Medicare Other | Admitting: Cardiovascular Disease

## 2012-12-19 ENCOUNTER — Encounter: Payer: Self-pay | Admitting: Cardiovascular Disease

## 2012-12-19 ENCOUNTER — Other Ambulatory Visit: Payer: Self-pay | Admitting: Internal Medicine

## 2012-12-19 VITALS — BP 132/76 | HR 93 | Ht 66.0 in | Wt 146.0 lb

## 2012-12-19 DIAGNOSIS — I2510 Atherosclerotic heart disease of native coronary artery without angina pectoris: Secondary | ICD-10-CM

## 2012-12-19 DIAGNOSIS — R002 Palpitations: Secondary | ICD-10-CM

## 2012-12-19 LAB — HEPATIC FUNCTION PANEL
ALT: 30 U/L (ref 0–35)
AST: 28 U/L (ref 0–37)
Bilirubin, Direct: 0.1 mg/dL (ref 0.0–0.3)
Total Protein: 7.3 g/dL (ref 6.0–8.3)

## 2012-12-19 LAB — LIPID PANEL
Cholesterol: 69 mg/dL (ref 0–200)
HDL: 31.3 mg/dL — ABNORMAL LOW (ref >39.00)
VLDL: 27.6 mg/dL (ref 0.0–40.0)

## 2012-12-19 NOTE — Progress Notes (Signed)
History of Present Illness: 72 yo female with history of DM2, HTN, HL, tobacco abuse and CAD. She was admitted in 12/28/10 with an acute inferior STEMI. Emergent cath on 12/29/10 with serial lesions in subtotally occluded RCA. We were unable to stent the vessel during the index procedure because of severe calcification in the mid vessel. Flow was established with balloon inflations and she was treated with Integrilin, Effient and ASA. She had staged PCI 12/31/10 at which time a rotablator atherectomy of the mid RCA was performed. She did well with both procedures. Two overlapping drug eluting stents were placed in the mid RCA. EF was found to be 45 % at the time of the cath. There was mild disease in the LAD and the Circumflex arteries. Echo 5/12: EF 55-60%, grade 1 diastolic dysfunction, mild MR, mild LAE. She was seen by Tereso Newcomer, PA-C 10/30/12 and reported occasional chest pains and palpitations. He arranged a 21 day monitor which showed no arrythmias.   She is here today for follow up. She tells me that she is feeling well. No chest pain or SOB. Some palpitations but unchanged in frequency. No near syncope or syncope.   Primary Care Physician: Laurann Montana  Last Lipid Profile: Followed in primary care.   Past Medical History  Diagnosis Date  . CAD (coronary artery disease)     s/p inferior STEMI 12/29/10 with rotablator atherectomy RCA 12/31/10 and DES x 2 RCA  . Ischemic cardiomyopathy     EF 45% by cath 12/29/10.   . Diabetes mellitus     type 2  . HTN (hypertension)   . Hyperlipidemia   . Diverticulosis   . Asthma   . COPD (chronic obstructive pulmonary disease)   . Small bowel obstruction     from a sigmoid stricture  . Nephrolithiasis   . Sleep apnea   . Overweight   . Osteoarthritis   . GERD (gastroesophageal reflux disease)   . Anxiety   . Glaucoma(365)   . History of colonoscopy     Past Surgical History  Procedure Date  . Bilateral tubal ligation   . Lithotripsy     . 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 Cardiac surgery     Current Outpatient Prescriptions  Medication Sig Dispense Refill  . albuterol (PROVENTIL HFA;VENTOLIN HFA) 108 (90 BASE) MCG/ACT inhaler Inhale 2 puffs into the lungs every 4 (four) hours as needed for wheezing.  1 Inhaler  0  . albuterol (PROVENTIL) (2.5 MG/3ML) 0.083% nebulizer solution Take 2.5 mg by nebulization every 6 (six) hours as needed. For shortness of breath.      Marland Kitchen amLODipine (NORVASC) 2.5 MG tablet Take 2.5 mg by mouth daily.        Marland Kitchen aspirin 81 MG tablet Take 81 mg by mouth daily.        . CRESTOR 40 MG tablet TAKE 1 TABLET BY MOUTH EVERY DAY  30 tablet  2  . cyclobenzaprine (FLEXERIL) 10 MG tablet Take 5-10 mg by mouth 3 (three) times daily as needed. For muscle spasms.      Marland Kitchen ezetimibe (ZETIA) 10 MG tablet Take 10 mg by mouth daily.        . Fluticasone-Salmeterol (ADVAIR) 250-50 MCG/DOSE AEPB Inhale 1 puff into the lungs 2 (two) times daily.  60 each  9  . glipiZIDE (GLUCOTROL) 5 MG tablet Take 5 mg by mouth daily.        . JENTADUETO 2.03-999 MG TABS  Take 1 tablet by mouth Twice daily.      . meperidine (DEMEROL) 50 MG tablet Take 50 mg by mouth every 4 (four) hours as needed.      . metoprolol tartrate (LOPRESSOR) 25 MG tablet Take 25 mg by mouth 2 (two) times daily.        . nitroGLYCERIN (NITROSTAT) 0.4 MG SL tablet Place 0.4 mg under the tongue every 5 (five) minutes as needed. For chest pain.      Marland Kitchen oxyCODONE-acetaminophen (PERCOCET/ROXICET) 5-325 MG per tablet Take 1 tablet by mouth every 6 (six) hours as needed.      . polyethylene glycol powder (MIRALAX) powder Take 17 g by mouth daily.        . prasugrel (EFFIENT) 10 MG TABS Take 1 tablet (10 mg total) by mouth daily.  30 tablet  0  . predniSONE (DELTASONE) 10 MG tablet Take 6 tablets (60 mg total) by mouth daily.  30 tablet  0  . pregabalin (LYRICA) 75 MG capsule Take 75 mg by mouth 2 (two) times daily.        . timolol  (BETIMOL) 0.5 % ophthalmic solution Place 1 drop into the right eye 2 (two) times daily.        . tizanidine (ZANAFLEX) 2 MG capsule Take 2-4 mg by mouth every 6 (six) hours.       Marland Kitchen tolterodine (DETROL LA) 4 MG 24 hr capsule Take 4 mg by mouth daily.        . travoprost, benzalkonium, (TRAVATAN) 0.004 % ophthalmic solution Place 1 drop into both eyes at bedtime.          Allergies  Allergen Reactions  . Erythromycin   . Lisinopril     REACTION: respiratory arrest jan 2009  . Penicillins   . Pentazocine Lactate   . Propoxyphene Hcl   . Quinapril Hcl   . Sulfonamide Derivatives     History   Social History  . Marital Status: Married    Spouse Name: N/A    Number of Children: N/A  . Years of Education: N/A   Occupational History  . Retired     Programmer, systems    Social History Main Topics  . Smoking status: Former Smoker -- 1.0 packs/day for 25 years    Types: Cigarettes    Quit date: 10/20/2004  . Smokeless tobacco: Never Used     Comment: over a ppd for 40+ years; quit 10/2004  . Alcohol Use: 0.5 oz/week    1 drink(s) per week     Comment: rare   . Drug Use: No  . Sexually Active: Not on file   Other Topics Concern  . Not on file   Social History Narrative  . No narrative on file    Family History  Problem Relation Age of Onset  . Breast cancer Maternal Grandmother   . Diabetes Father   . Diabetes Sister   . Heart disease Father   . Colon cancer Neg Hx     Review of Systems:  As stated in the HPI and otherwise negative.   BP 132/76  Pulse 93  Ht 5\' 6"  (1.676 m)  Wt 146 lb (66.225 kg)  BMI 23.56 kg/m2  Physical Examination: General: Well developed, well nourished, NAD HEENT: OP clear, mucus membranes moist SKIN: warm, dry. No rashes. Neuro: No focal deficits Musculoskeletal: Muscle strength 5/5 all ext Psychiatric: Mood and affect normal Neck: No JVD, no carotid bruits, no thyromegaly, no  lymphadenopathy. Lungs:Clear bilaterally,  no wheezes, rhonci, crackles Cardiovascular: Regular rate and rhythm. No murmurs, gallops or rubs. Abdomen:Soft. Bowel sounds present. Non-tender.  Extremities: No lower extremity edema. Pulses are 2 + in the bilateral DP/PT.  Assessment and Plan:   1. Palpitations: TSH is ok. Event monitor without any VT, a. Fib. These have been occurring for years and no change.   2. Coronary Artery Disease: Stable. Continue current meds.  Will check lipids and LFTs today.   3. Hypertension: Controlled.

## 2012-12-19 NOTE — Patient Instructions (Addendum)
Your physician wants you to follow-up in:  6 months. You will receive a reminder letter in the mail two months in advance. If you don't receive a letter, please call our office to schedule the follow-up appointment.   

## 2012-12-20 ENCOUNTER — Other Ambulatory Visit: Payer: Medicare Other

## 2012-12-20 ENCOUNTER — Other Ambulatory Visit: Payer: Self-pay | Admitting: Family Medicine

## 2012-12-20 ENCOUNTER — Other Ambulatory Visit: Payer: Self-pay | Admitting: *Deleted

## 2012-12-20 ENCOUNTER — Ambulatory Visit
Admission: RE | Admit: 2012-12-20 | Discharge: 2012-12-20 | Disposition: A | Discharge status: Home / Self Care | Payer: Medicare Other | Source: Ambulatory Visit | Attending: Family Medicine | Admitting: Family Medicine

## 2012-12-20 ENCOUNTER — Inpatient Hospital Stay: Admission: RE | Admit: 2012-12-20 | Payer: Medicare Other | Source: Ambulatory Visit

## 2012-12-20 DIAGNOSIS — M21379 Foot drop, unspecified foot: Secondary | ICD-10-CM

## 2012-12-20 DIAGNOSIS — M545 Low back pain, unspecified: Secondary | ICD-10-CM

## 2012-12-20 DIAGNOSIS — I2510 Atherosclerotic heart disease of native coronary artery without angina pectoris: Secondary | ICD-10-CM

## 2012-12-20 MED ORDER — ROSUVASTATIN CALCIUM 20 MG PO TABS: 20.0000 mg | ORAL_TABLET | Freq: Every day | ORAL | Status: DC

## 2012-12-28 ENCOUNTER — Other Ambulatory Visit: Payer: Self-pay | Admitting: Cardiovascular Disease

## 2013-01-08 ENCOUNTER — Other Ambulatory Visit: Payer: Self-pay | Admitting: Family Medicine

## 2013-01-08 DIAGNOSIS — R413 Other amnesia: Secondary | ICD-10-CM

## 2013-01-08 DIAGNOSIS — M21379 Foot drop, unspecified foot: Secondary | ICD-10-CM

## 2013-01-11 ENCOUNTER — Emergency Department (HOSPITAL_COMMUNITY): Payer: Medicare Other

## 2013-01-11 ENCOUNTER — Encounter (HOSPITAL_COMMUNITY): Payer: Self-pay | Admitting: Emergency Medicine

## 2013-01-11 ENCOUNTER — Emergency Department (HOSPITAL_COMMUNITY)
Admission: EM | Admit: 2013-01-11 | Discharge: 2013-01-12 | Disposition: A | Discharge status: Home / Self Care | Payer: Medicare Other | Attending: Emergency Medicine | Admitting: Emergency Medicine

## 2013-01-11 DIAGNOSIS — G4730 Sleep apnea, unspecified: Secondary | ICD-10-CM | POA: Insufficient documentation

## 2013-01-11 DIAGNOSIS — I2510 Atherosclerotic heart disease of native coronary artery without angina pectoris: Secondary | ICD-10-CM | POA: Insufficient documentation

## 2013-01-11 DIAGNOSIS — R0602 Shortness of breath: Secondary | ICD-10-CM | POA: Insufficient documentation

## 2013-01-11 DIAGNOSIS — Z8679 Personal history of other diseases of the circulatory system: Secondary | ICD-10-CM | POA: Insufficient documentation

## 2013-01-11 DIAGNOSIS — M1990 Unspecified osteoarthritis, unspecified site: Secondary | ICD-10-CM | POA: Insufficient documentation

## 2013-01-11 DIAGNOSIS — I10 Essential (primary) hypertension: Secondary | ICD-10-CM | POA: Insufficient documentation

## 2013-01-11 DIAGNOSIS — R0789 Other chest pain: Secondary | ICD-10-CM | POA: Insufficient documentation

## 2013-01-11 DIAGNOSIS — K219 Gastro-esophageal reflux disease without esophagitis: Secondary | ICD-10-CM | POA: Insufficient documentation

## 2013-01-11 DIAGNOSIS — R079 Chest pain, unspecified: Secondary | ICD-10-CM

## 2013-01-11 DIAGNOSIS — F411 Generalized anxiety disorder: Secondary | ICD-10-CM | POA: Insufficient documentation

## 2013-01-11 DIAGNOSIS — Z7982 Long term (current) use of aspirin: Secondary | ICD-10-CM | POA: Insufficient documentation

## 2013-01-11 DIAGNOSIS — H409 Unspecified glaucoma: Secondary | ICD-10-CM | POA: Insufficient documentation

## 2013-01-11 DIAGNOSIS — I252 Old myocardial infarction: Secondary | ICD-10-CM | POA: Insufficient documentation

## 2013-01-11 DIAGNOSIS — E663 Overweight: Secondary | ICD-10-CM | POA: Insufficient documentation

## 2013-01-11 DIAGNOSIS — R110 Nausea: Secondary | ICD-10-CM | POA: Insufficient documentation

## 2013-01-11 DIAGNOSIS — Z8719 Personal history of other diseases of the digestive system: Secondary | ICD-10-CM | POA: Insufficient documentation

## 2013-01-11 DIAGNOSIS — E785 Hyperlipidemia, unspecified: Secondary | ICD-10-CM | POA: Insufficient documentation

## 2013-01-11 DIAGNOSIS — Z87442 Personal history of urinary calculi: Secondary | ICD-10-CM | POA: Insufficient documentation

## 2013-01-11 DIAGNOSIS — Z794 Long term (current) use of insulin: Secondary | ICD-10-CM | POA: Insufficient documentation

## 2013-01-11 DIAGNOSIS — IMO0002 Reserved for concepts with insufficient information to code with codable children: Secondary | ICD-10-CM | POA: Insufficient documentation

## 2013-01-11 DIAGNOSIS — R5381 Other malaise: Secondary | ICD-10-CM | POA: Insufficient documentation

## 2013-01-11 DIAGNOSIS — IMO0001 Reserved for inherently not codable concepts without codable children: Secondary | ICD-10-CM | POA: Insufficient documentation

## 2013-01-11 DIAGNOSIS — Z79899 Other long term (current) drug therapy: Secondary | ICD-10-CM | POA: Insufficient documentation

## 2013-01-11 DIAGNOSIS — F17200 Nicotine dependence, unspecified, uncomplicated: Secondary | ICD-10-CM | POA: Insufficient documentation

## 2013-01-11 LAB — CBC
MCH: 32.9 pg (ref 26.0–34.0)
MCV: 94.9 fL (ref 78.0–100.0)
Platelets: 168 10*3/uL (ref 150–400)
RBC: 4.53 MIL/uL (ref 3.87–5.11)
RDW: 12.1 % (ref 11.5–15.5)

## 2013-01-11 LAB — BASIC METABOLIC PANEL
CO2: 25 mEq/L (ref 19–32)
Calcium: 9.2 mg/dL (ref 8.4–10.5)
Glucose, Bld: 354 mg/dL — ABNORMAL HIGH (ref 70–99)
Sodium: 134 mEq/L — ABNORMAL LOW (ref 135–145)

## 2013-01-11 MED ORDER — ISOSORBIDE MONONITRATE ER 30 MG PO TB24
30.0000 mg | ORAL_TABLET | Freq: Every day | ORAL | Status: DC
  Administered 2013-01-12: 30 mg via ORAL
  Filled 2013-01-11: qty 1

## 2013-01-11 MED ORDER — ASPIRIN 81 MG PO CHEW
324.0000 mg | CHEWABLE_TABLET | Freq: Once | ORAL | Status: AC
  Administered 2013-01-11: 324 mg via ORAL
  Filled 2013-01-11: qty 4

## 2013-01-11 MED ORDER — ASPIRIN 325 MG PO TABS: 325.0000 mg | ORAL_TABLET | ORAL | Status: DC

## 2013-01-11 NOTE — ED Provider Notes (Signed)
History     CSN: [id number]  Arrival date & time 01/11/13  1956   First MD Initiated Contact with Patient 01/11/13 2220      Chief Complaint  Patient presents with  . Chest Pain    (Consider location/radiation/quality/duration/timing/severity/associated sxs/prior treatment) HPI Complains of anterior chest pain described as pressure onset 6:45 PM tonight symptoms lasted 2 hours result spontaneously onset at rest symptoms accompanied by generalized weakness nausea and shortness of breath no diaphoresis no treatment prior to coming here no other complaint. Nothing makes symptoms better or worse. She is uncertain if it felt like "heart pain" that she's had in the past Past Medical History  Diagnosis Date  . CAD (coronary artery disease)     s/p inferior STEMI 12/29/10 with rotablator atherectomy RCA 12/31/10 and DES x 2 RCA  . Ischemic cardiomyopathy     EF 45% by cath 12/29/10.   . Diabetes mellitus     type 2  . HTN (hypertension)   . Hyperlipidemia   . Diverticulosis   . Asthma   . COPD (chronic obstructive pulmonary disease)   . Small bowel obstruction     from a sigmoid stricture  . Nephrolithiasis   . Sleep apnea   . Overweight   . Osteoarthritis   . GERD (gastroesophageal reflux disease)   . Anxiety   . Glaucoma(365)   . History of colonoscopy     Past Surgical History  Procedure Laterality Date  . Bilateral tubal ligation    . Lithotripsy    . 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 Cardiac surgery      Family History  Problem Relation Age of Onset  . Breast cancer Maternal Grandmother   . Diabetes Father   . Diabetes Sister   . Heart disease Father   . Colon cancer Neg Hx     History  Substance Use Topics  . Smoking status: Current Some Day Smoker -- 1.00 packs/day for 25 years    Types: Cigarettes    Last Attempt to Quit: 10/20/2004  . Smokeless tobacco: Never Used     Comment: over a ppd for 40+ years; quit 10/2004   . Alcohol Use: 0.5 oz/week    1 drink(s) per week     Comment: rare     OB History   Grav Para Term Preterm Abortions TAB SAB Ect Mult Living                  Review of Systems  Constitutional: Negative.   HENT: Negative.   Respiratory: Positive for shortness of breath.   Cardiovascular: Positive for chest pain.  Gastrointestinal: Positive for nausea.  Musculoskeletal: Negative.   Skin: Negative.   Neurological: Negative.   Psychiatric/Behavioral: Negative.   All other systems reviewed and are negative.    Allergies  Lisinopril; Erythromycin; Iodine; Penicillins; Pentazocine lactate; Propoxyphene hcl; Quinapril hcl; and Sulfonamide derivatives  Home Medications   Current Outpatient Rx  Name  Route  Sig  Dispense  Refill  . albuterol (PROVENTIL) (2.5 MG/3ML) 0.083% nebulizer solution   Nebulization   Take 2.5 mg by nebulization every 6 (six) hours as needed. For shortness of breath.         Marland Kitchen amLODipine (NORVASC) 2.5 MG tablet   Oral   Take 2.5 mg by mouth daily.           Marland Kitchen aspirin 81 MG tablet   Oral   Take 81 mg by mouth  daily.           . cyclobenzaprine (FLEXERIL) 10 MG tablet   Oral   Take 5-10 mg by mouth 3 (three) times daily as needed. For muscle spasms.         Marland Kitchen ezetimibe (ZETIA) 10 MG tablet   Oral   Take 10 mg by mouth daily.           . Fluticasone-Salmeterol (ADVAIR DISKUS) 250-50 MCG/DOSE AEPB   Inhalation   Inhale 1 puff into the lungs every 12 (twelve) hours.         Marland Kitchen glipiZIDE (GLUCOTROL) 5 MG tablet   Oral   Take 5 mg by mouth daily.           Marland Kitchen ibuprofen (ADVIL,MOTRIN) 200 MG tablet   Oral   Take 200 mg by mouth every 6 (six) hours as needed for pain.         Marland Kitchen insulin glargine (LANTUS SOLOSTAR) 100 UNIT/ML injection   Subcutaneous   Inject 10 Units into the skin at bedtime. Or use as sliding scale         . JENTADUETO 2.03-999 MG TABS   Oral   Take 1 tablet by mouth Twice daily.         . meperidine  (DEMEROL) 50 MG tablet   Oral   Take 50 mg by mouth every 4 (four) hours as needed.         . metoprolol tartrate (LOPRESSOR) 25 MG tablet   Oral   Take 25 mg by mouth 2 (two) times daily.           . nitroGLYCERIN (NITROSTAT) 0.4 MG SL tablet   Sublingual   Place 0.4 mg under the tongue every 5 (five) minutes as needed. For chest pain.         Marland Kitchen oxyCODONE-acetaminophen (PERCOCET/ROXICET) 5-325 MG per tablet   Oral   Take 1 tablet by mouth every 6 (six) hours as needed for pain.          . polyethylene glycol powder (MIRALAX) powder   Oral   Take 17 g by mouth daily.           . prasugrel (EFFIENT) 10 MG TABS   Oral   Take 10 mg by mouth daily.         . pregabalin (LYRICA) 75 MG capsule   Oral   Take 75 mg by mouth 2 (two) times daily.           . rosuvastatin (CRESTOR) 20 MG tablet   Oral   Take 1 tablet (20 mg total) by mouth daily.   30 tablet   6   . timolol (BETIMOL) 0.5 % ophthalmic solution   Right Eye   Place 1 drop into the right eye 2 (two) times daily.           Marland Kitchen tolterodine (DETROL LA) 4 MG 24 hr capsule   Oral   Take 4 mg by mouth daily.           . travoprost, benzalkonium, (TRAVATAN) 0.004 % ophthalmic solution   Both Eyes   Place 1 drop into both eyes at bedtime.             BP 133/66  Pulse 77  Temp(Src) 97.9 F (36.6 C) (Oral)  Resp 20  Ht 5\' 6"  (1.676 m)  Wt 146 lb (66.225 kg)  BMI 23.58 kg/m2  SpO2 98%  Physical Exam  Nursing note and  vitals reviewed. Constitutional: She appears well-developed and well-nourished.  HENT:  Head: Normocephalic and atraumatic.  Eyes: Conjunctivae are normal. Pupils are equal, round, and reactive to light.  Neck: Neck supple. No tracheal deviation present. No thyromegaly present.  Cardiovascular: Normal rate and regular rhythm.   No murmur heard. Pulmonary/Chest: Effort normal and breath sounds normal.  Abdominal: Soft. Bowel sounds are normal. She exhibits no distension. There  is no tenderness.  Musculoskeletal: Normal range of motion. She exhibits no edema and no tenderness.  Neurological: She is alert. Coordination normal.  Skin: Skin is warm and dry. No rash noted.  Psychiatric: She has a normal mood and affect.    ED Course  Procedures (including critical care time)  Labs Reviewed  CBC - Abnormal; Notable for the following:    WBC 10.7 (*)    All other components within normal limits  BASIC METABOLIC PANEL - Abnormal; Notable for the following:    Sodium 134 (*)    Glucose, Bld 354 (*)    GFR calc non Af Amer 83 (*)    All other components within normal limits  TROPONIN I  PROTIME-INR   Dg Chest Port 1 View  01/11/2013  *RADIOLOGY REPORT*  Clinical Data: Chest pressure.  Smoker.  PORTABLE CHEST - 1 VIEW  Comparison: Previous examinations, including the chest radiographs dated 10/30/2011 and chest CT dated 02/03/2008.  Findings: Normal sized heart.  Stable chronic pleural thickening and prominent epicardial fat pad at the left lung base with a possible small amount of pleural fluid.  Clear right lung.  Lower thoracic spine degenerative changes.  IMPRESSION: No acute abnormality.   Original Report Authenticated By: Beckie Salts, M.D.      No diagnosis found.   Date: 01/11/2013  Rate: 85  Rhythm: normal sinus rhythm  QRS Axis: normal  Intervals: normal  ST/T Wave abnormalities: normal  Conduction Disutrbances:none  Narrative Interpretation:   Old EKG Reviewed: Inferior wall MI age indeterminate compared to tracing from 12/30/2010 has resolved interpreted by me Results for orders placed during the hospital encounter of 01/11/13  CBC      Result Value Range   WBC 10.7 (*) 4.0 - 10.5 K/uL   RBC 4.53  3.87 - 5.11 MIL/uL   Hemoglobin 14.9  12.0 - 15.0 g/dL   HCT 40.9  81.1 - 91.4 %   MCV 94.9  78.0 - 100.0 fL   MCH 32.9  26.0 - 34.0 pg   MCHC 34.7  30.0 - 36.0 g/dL   RDW 78.2  95.6 - 21.3 %   Platelets 168  150 - 400 K/uL  TROPONIN I       Result Value Range   Troponin I <0.30  <0.30 ng/mL  PROTIME-INR      Result Value Range   Prothrombin Time 13.0  11.6 - 15.2 seconds   INR 0.99  0.00 - 1.49  BASIC METABOLIC PANEL      Result Value Range   Sodium 134 (*) 135 - 145 mEq/L   Potassium 4.0  3.5 - 5.1 mEq/L   Chloride 97  96 - 112 mEq/L   CO2 25  19 - 32 mEq/L   Glucose, Bld 354 (*) 70 - 99 mg/dL   BUN 14  6 - 23 mg/dL   Creatinine, Ser 0.86  0.50 - 1.10 mg/dL   Calcium 9.2  8.4 - 57.8 mg/dL   GFR calc non Af Amer 83 (*) >90 mL/min   GFR calc Af Amer >90  >  90 mL/min  POCT I-STAT TROPONIN I      Result Value Range   Troponin i, poc 0.02  0.00 - 0.08 ng/mL   Comment 3            Dg Chest Port 1 View  01/11/2013  *RADIOLOGY REPORT*  Clinical Data: Chest pressure.  Smoker.  PORTABLE CHEST - 1 VIEW  Comparison: Previous examinations, including the chest radiographs dated 10/30/2011 and chest CT dated 02/03/2008.  Findings: Normal sized heart.  Stable chronic pleural thickening and prominent epicardial fat pad at the left lung base with a possible small amount of pleural fluid.  Clear right lung.  Lower thoracic spine degenerative changes.  IMPRESSION: No acute abnormality.   Original Report Authenticated By: Beckie Salts, M.D.     Chest xray reviewed by me MDM  Patient may have had a brief and self-limiting episode of angina. Case discussed with Dr.Atisbaiomo, cardiologist on call for [place] In light of 2 sets of negative enzymes she can be discharged home with prescription for Imdur 30 mg daily. She should take aspirin 81 mg daily. Patient likely hyperglycemic secondary to the fact that she did not take her insulin tonight. Patient tells me she will take her insulin associated home rather than receive insulin here  F will thousandollowup with Dr.Mcalhany on 01/13/2013 Dx #1chest pain #2 hyperglycemia        Doug Sou, MD 01/12/13 [phone]

## 2013-01-11 NOTE — ED Notes (Signed)
MD at bedside. 

## 2013-01-11 NOTE — ED Notes (Signed)
Pt states she had been SHOB most of the day, used Advair x 2 today. Pt states after eating @ 1845 began having a heaviness/pressure across chest. Pt did become nauseated but states that not is abnormal for her. Denies diaphoresis. Pt denies dizziness.

## 2013-01-12 ENCOUNTER — Other Ambulatory Visit: Payer: Medicare Other

## 2013-01-12 LAB — POCT I-STAT TROPONIN I

## 2013-01-12 MED ORDER — ISOSORBIDE MONONITRATE ER 30 MG PO TB24: 30.0000 mg | ORAL_TABLET | Freq: Every day | ORAL | Status: DC

## 2013-01-19 ENCOUNTER — Other Ambulatory Visit: Payer: Medicare Other

## 2013-01-28 ENCOUNTER — Ambulatory Visit
Admission: RE | Admit: 2013-01-28 | Discharge: 2013-01-28 | Disposition: A | Discharge status: Home / Self Care | Payer: Medicare Other | Source: Ambulatory Visit | Attending: Family Medicine | Admitting: Family Medicine

## 2013-01-28 DIAGNOSIS — M21379 Foot drop, unspecified foot: Secondary | ICD-10-CM

## 2013-01-28 DIAGNOSIS — R413 Other amnesia: Secondary | ICD-10-CM

## 2013-04-02 ENCOUNTER — Encounter (HOSPITAL_COMMUNITY): Payer: Self-pay | Admitting: *Deleted

## 2013-04-02 ENCOUNTER — Inpatient Hospital Stay (HOSPITAL_COMMUNITY)
Admission: EM | Admit: 2013-04-02 | Discharge: 2013-04-04 | DRG: 074 | Disposition: A | Discharge status: Home Health | Payer: Medicare Other | Attending: Internal Medicine | Admitting: Internal Medicine

## 2013-04-02 ENCOUNTER — Emergency Department (HOSPITAL_COMMUNITY): Payer: Medicare Other

## 2013-04-02 DIAGNOSIS — E119 Type 2 diabetes mellitus without complications: Secondary | ICD-10-CM | POA: Diagnosis present

## 2013-04-02 DIAGNOSIS — F17200 Nicotine dependence, unspecified, uncomplicated: Secondary | ICD-10-CM | POA: Diagnosis present

## 2013-04-02 DIAGNOSIS — R7309 Other abnormal glucose: Secondary | ICD-10-CM

## 2013-04-02 DIAGNOSIS — I10 Essential (primary) hypertension: Secondary | ICD-10-CM | POA: Diagnosis present

## 2013-04-02 DIAGNOSIS — E1142 Type 2 diabetes mellitus with diabetic polyneuropathy: Secondary | ICD-10-CM | POA: Diagnosis present

## 2013-04-02 DIAGNOSIS — F09 Unspecified mental disorder due to known physiological condition: Secondary | ICD-10-CM

## 2013-04-02 DIAGNOSIS — E785 Hyperlipidemia, unspecified: Secondary | ICD-10-CM | POA: Diagnosis present

## 2013-04-02 DIAGNOSIS — J441 Chronic obstructive pulmonary disease with (acute) exacerbation: Secondary | ICD-10-CM | POA: Diagnosis present

## 2013-04-02 DIAGNOSIS — E663 Overweight: Secondary | ICD-10-CM | POA: Diagnosis present

## 2013-04-02 DIAGNOSIS — E1149 Type 2 diabetes mellitus with other diabetic neurological complication: Principal | ICD-10-CM | POA: Diagnosis present

## 2013-04-02 DIAGNOSIS — I2510 Atherosclerotic heart disease of native coronary artery without angina pectoris: Secondary | ICD-10-CM | POA: Diagnosis present

## 2013-04-02 DIAGNOSIS — I25810 Atherosclerosis of coronary artery bypass graft(s) without angina pectoris: Secondary | ICD-10-CM

## 2013-04-02 DIAGNOSIS — R739 Hyperglycemia, unspecified: Secondary | ICD-10-CM | POA: Diagnosis present

## 2013-04-02 DIAGNOSIS — E876 Hypokalemia: Secondary | ICD-10-CM | POA: Diagnosis present

## 2013-04-02 DIAGNOSIS — I252 Old myocardial infarction: Secondary | ICD-10-CM

## 2013-04-02 DIAGNOSIS — IMO0001 Reserved for inherently not codable concepts without codable children: Secondary | ICD-10-CM

## 2013-04-02 HISTORY — DX: Type 2 diabetes mellitus without complications: E11.9

## 2013-04-02 LAB — COMPREHENSIVE METABOLIC PANEL
ALT: 18 U/L (ref 0–35)
Alkaline Phosphatase: 131 U/L — ABNORMAL HIGH (ref 39–117)
CO2: 26 mEq/L (ref 19–32)
GFR calc Af Amer: >90 mL/min (ref >90)
GFR calc non Af Amer: >90 mL/min (ref >90)
Glucose, Bld: 655 mg/dL (ref 70–99)
Potassium: 3.4 mEq/L — ABNORMAL LOW (ref 3.5–5.1)
Sodium: 126 mEq/L — ABNORMAL LOW (ref 135–145)

## 2013-04-02 LAB — URINALYSIS, ROUTINE W REFLEX MICROSCOPIC
Bilirubin Urine: NEGATIVE
Hgb urine dipstick: NEGATIVE
Ketones, ur: NEGATIVE mg/dL
Nitrite: NEGATIVE
Urobilinogen, UA: 0.2 mg/dL (ref 0.0–1.0)
pH: 6 (ref 5.0–8.0)

## 2013-04-02 LAB — CBC WITH DIFFERENTIAL/PLATELET
Eosinophils Absolute: 0.1 10*3/uL (ref 0.0–0.7)
Hemoglobin: 15.1 g/dL — ABNORMAL HIGH (ref 12.0–15.0)
Lymphs Abs: 2.3 10*3/uL (ref 0.7–4.0)
MCH: 32.7 pg (ref 26.0–34.0)
Monocytes Relative: 4 % (ref 3–12)
Neutro Abs: 6.3 10*3/uL (ref 1.7–7.7)
Neutrophils Relative %: 69 % (ref 43–77)
RBC: 4.62 MIL/uL (ref 3.87–5.11)

## 2013-04-02 LAB — BLOOD GAS, VENOUS
Bicarbonate: 25.6 mEq/L — ABNORMAL HIGH (ref 20.0–24.0)
TCO2: 21.9 mmol/L (ref 0–100)
pCO2, Ven: 39.3 mmHg — ABNORMAL LOW (ref 45.0–50.0)
pH, Ven: 7.429 — ABNORMAL HIGH (ref 7.250–7.300)
pO2, Ven: 51.6 mmHg — ABNORMAL HIGH (ref 30.0–45.0)

## 2013-04-02 LAB — GLUCOSE, CAPILLARY
Glucose-Capillary: 444 mg/dL — ABNORMAL HIGH (ref 70–99)
Glucose-Capillary: 353 mg/dL — ABNORMAL HIGH (ref 70–99)
Glucose-Capillary: 288 mg/dL — ABNORMAL HIGH (ref 70–99)

## 2013-04-02 LAB — POTASSIUM: Potassium: 3.2 mEq/L — ABNORMAL LOW (ref 3.5–5.1)

## 2013-04-02 MED ORDER — POLYETHYLENE GLYCOL 3350 17 GM/SCOOP PO POWD
17.0000 g | Freq: Every day | ORAL | Status: DC
  Filled 2013-04-02: qty 255

## 2013-04-02 MED ORDER — TIMOLOL MALEATE 0.5 % OP SOLN
1.0000 [drp] | Freq: Two times a day (BID) | OPHTHALMIC | Status: DC
  Administered 2013-04-02 – 2013-04-04 (×4): 1 [drp] via OPHTHALMIC
  Filled 2013-04-02: qty 5

## 2013-04-02 MED ORDER — SODIUM CHLORIDE 0.9 % IV SOLN
INTRAVENOUS | Status: DC
  Administered 2013-04-03 – 2013-04-04 (×2): via INTRAVENOUS

## 2013-04-02 MED ORDER — INSULIN REGULAR BOLUS VIA INFUSION
0.0000 [IU] | Freq: Three times a day (TID) | INTRAVENOUS | Status: DC
  Administered 2013-04-03: 6 [IU] via INTRAVENOUS
  Filled 2013-04-02: qty 10

## 2013-04-02 MED ORDER — SODIUM CHLORIDE 0.9 % IV SOLN: INTRAVENOUS | Status: AC

## 2013-04-02 MED ORDER — SODIUM CHLORIDE 0.9 % IV SOLN
1000.0000 mL | INTRAVENOUS | Status: DC
  Administered 2013-04-02: 1000 mL via INTRAVENOUS

## 2013-04-02 MED ORDER — NITROGLYCERIN 0.4 MG SL SUBL: 0.4000 mg | SUBLINGUAL_TABLET | SUBLINGUAL | Status: DC | PRN

## 2013-04-02 MED ORDER — EZETIMIBE 10 MG PO TABS
10.0000 mg | ORAL_TABLET | Freq: Every day | ORAL | Status: DC
  Administered 2013-04-03 – 2013-04-04 (×2): 10 mg via ORAL
  Filled 2013-04-02 (×2): qty 1

## 2013-04-02 MED ORDER — ASPIRIN EC 81 MG PO TBEC
81.0000 mg | DELAYED_RELEASE_TABLET | Freq: Every day | ORAL | Status: DC
  Administered 2013-04-03 – 2013-04-04 (×2): 81 mg via ORAL
  Filled 2013-04-02 (×2): qty 1

## 2013-04-02 MED ORDER — ALBUTEROL SULFATE (5 MG/ML) 0.5% IN NEBU: 2.5000 mg | INHALATION_SOLUTION | RESPIRATORY_TRACT | Status: DC | PRN

## 2013-04-02 MED ORDER — TRAVOPROST (BAK FREE) 0.004 % OP SOLN
1.0000 [drp] | Freq: Every day | OPHTHALMIC | Status: DC
  Administered 2013-04-02 – 2013-04-03 (×2): 1 [drp] via OPHTHALMIC
  Filled 2013-04-02: qty 2.5

## 2013-04-02 MED ORDER — ATORVASTATIN CALCIUM 40 MG PO TABS
40.0000 mg | ORAL_TABLET | Freq: Every day | ORAL | Status: DC
  Administered 2013-04-03: 40 mg via ORAL
  Filled 2013-04-02 (×2): qty 1

## 2013-04-02 MED ORDER — ISOSORBIDE MONONITRATE ER 30 MG PO TB24
30.0000 mg | ORAL_TABLET | Freq: Every day | ORAL | Status: DC
  Administered 2013-04-03 – 2013-04-04 (×2): 30 mg via ORAL
  Filled 2013-04-02 (×2): qty 1

## 2013-04-02 MED ORDER — DEXTROSE 50 % IV SOLN: 25.0000 mL | INTRAVENOUS | Status: DC | PRN

## 2013-04-02 MED ORDER — POTASSIUM CHLORIDE 10 MEQ/100ML IV SOLN
10.0000 meq | Freq: Once | INTRAVENOUS | Status: AC
  Administered 2013-04-02: 10 meq via INTRAVENOUS
  Filled 2013-04-02: qty 100

## 2013-04-02 MED ORDER — DEXTROSE-NACL 5-0.45 % IV SOLN
INTRAVENOUS | Status: DC
  Administered 2013-04-02: 23:00:00 via INTRAVENOUS

## 2013-04-02 MED ORDER — MOMETASONE FURO-FORMOTEROL FUM 100-5 MCG/ACT IN AERO
2.0000 | INHALATION_SPRAY | Freq: Two times a day (BID) | RESPIRATORY_TRACT | Status: DC
  Administered 2013-04-02 – 2013-04-04 (×4): 2 via RESPIRATORY_TRACT
  Filled 2013-04-02: qty 8.8

## 2013-04-02 MED ORDER — INSULIN REGULAR HUMAN 100 UNIT/ML IJ SOLN
INTRAMUSCULAR | Status: DC
  Administered 2013-04-02: 3.8 [IU]/h via INTRAVENOUS
  Filled 2013-04-02: qty 1

## 2013-04-02 MED ORDER — FESOTERODINE FUMARATE ER 8 MG PO TB24
8.0000 mg | ORAL_TABLET | Freq: Every day | ORAL | Status: DC
  Administered 2013-04-03 – 2013-04-04 (×2): 8 mg via ORAL
  Filled 2013-04-02 (×3): qty 1

## 2013-04-02 MED ORDER — AMLODIPINE BESYLATE 2.5 MG PO TABS
2.5000 mg | ORAL_TABLET | Freq: Every day | ORAL | Status: DC
  Administered 2013-04-03 – 2013-04-04 (×2): 2.5 mg via ORAL
  Filled 2013-04-02 (×2): qty 1

## 2013-04-02 MED ORDER — IBUPROFEN 200 MG PO TABS
200.0000 mg | ORAL_TABLET | Freq: Three times a day (TID) | ORAL | Status: DC | PRN
  Filled 2013-04-02: qty 1

## 2013-04-02 MED ORDER — POTASSIUM CHLORIDE CRYS ER 20 MEQ PO TBCR
40.0000 meq | EXTENDED_RELEASE_TABLET | Freq: Once | ORAL | Status: AC
  Administered 2013-04-02: 40 meq via ORAL
  Filled 2013-04-02: qty 2

## 2013-04-02 MED ORDER — PRASUGREL HCL 10 MG PO TABS
10.0000 mg | ORAL_TABLET | Freq: Every day | ORAL | Status: DC
  Administered 2013-04-03 – 2013-04-04 (×2): 10 mg via ORAL
  Filled 2013-04-02 (×2): qty 1

## 2013-04-02 MED ORDER — CYCLOBENZAPRINE HCL 10 MG PO TABS
5.0000 mg | ORAL_TABLET | Freq: Three times a day (TID) | ORAL | Status: DC | PRN
  Administered 2013-04-03: 10 mg via ORAL
  Filled 2013-04-02: qty 1

## 2013-04-02 MED ORDER — OXYCODONE-ACETAMINOPHEN 5-325 MG PO TABS
1.0000 | ORAL_TABLET | Freq: Four times a day (QID) | ORAL | Status: DC | PRN
  Administered 2013-04-02 – 2013-04-03 (×2): 1 via ORAL
  Filled 2013-04-02 (×2): qty 1

## 2013-04-02 MED ORDER — PREGABALIN 75 MG PO CAPS
75.0000 mg | ORAL_CAPSULE | Freq: Two times a day (BID) | ORAL | Status: DC
  Administered 2013-04-02 – 2013-04-04 (×4): 75 mg via ORAL
  Filled 2013-04-02 (×4): qty 1

## 2013-04-02 MED ORDER — POTASSIUM CHLORIDE 10 MEQ/100ML IV SOLN
10.0000 meq | INTRAVENOUS | Status: AC
  Administered 2013-04-03 (×2): 10 meq via INTRAVENOUS
  Filled 2013-04-02 (×2): qty 100

## 2013-04-02 MED ORDER — METOPROLOL TARTRATE 25 MG PO TABS
25.0000 mg | ORAL_TABLET | Freq: Two times a day (BID) | ORAL | Status: DC
  Administered 2013-04-03 – 2013-04-04 (×2): 25 mg via ORAL
  Filled 2013-04-02 (×5): qty 1

## 2013-04-02 NOTE — H&P (Signed)
[facility] History and Physical  [name] DGU:[id number] DOB: 1941/03/11 DOA: 04/02/2013  Referring physician: ED PCP: [name], MD  Specialists: None  Chief Complaint: Hyperglycemia  HPI: [name] is a 72 y.o. female with known h/o DM2 on lantus as well as PO meds at home who presents to the ED after seeing her PCP earlier today, having her BGL checked and being found to have BGL of over 600.  The patient states her BGL was 200 this morning when she took it and dosent understand how it could have gone up so much in just a couple of hours.  The patient sates she is and has been completely asymptomatic and has no complaints nor recent illness.  In the ED patient was found to be hyperglycemic with mild hypokalemia, insulin gtt started, hospitalist asked to admit.  Review of Systems: 12 systems reviewed and otherwise negative.  Past Medical History  Diagnosis Date  . CAD (coronary artery disease)     s/p inferior STEMI 12/29/10 with rotablator atherectomy RCA 12/31/10 and DES x 2 RCA  . Ischemic cardiomyopathy     EF 45% by cath 12/29/10.   . Diabetes mellitus     type 2  . HTN (hypertension)   . Hyperlipidemia   . Diverticulosis   . Asthma   . COPD (chronic obstructive pulmonary disease)   . Small bowel obstruction     from a sigmoid stricture  . Nephrolithiasis   . Sleep apnea   . Overweight   . Osteoarthritis   . GERD (gastroesophageal reflux disease)   . Anxiety   . Glaucoma(365)   . History of colonoscopy    Past Surgical History  Procedure Laterality Date  . Bilateral tubal ligation    . Lithotripsy    . 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 Cardiac surgery     Social History:  reports that she has been smoking Cigarettes.  She has a 25 pack-year smoking history. She has never used smokeless tobacco. She reports that she drinks about 0.5 ounces of alcohol per week. She reports that she does not use illicit  drugs.   Allergies  Allergen Reactions  . Lisinopril     REACTION: respiratory arrest jan 2009  . Erythromycin Other (See Comments)    Makes me feel weird   . Iodine     Pt may be allergic to iodine, does not remember the details, refuses contrast-CS  . Penicillins Itching and Other (See Comments)    Too much as a kid  . Pentazocine Lactate   . Propoxyphene Hcl   . Quinapril Hcl Itching and Other (See Comments)    Too much   . Sulfonamide Derivatives Other (See Comments)    Childhood allergy    Family History  Problem Relation Age of Onset  . Breast cancer Maternal Grandmother   . Diabetes Father   . Diabetes Sister   . Heart disease Father   . Colon cancer Neg Hx     Prior to Admission medications   Medication Sig Start Date End Date Taking? Authorizing Provider  albuterol (PROVENTIL) (2.5 MG/3ML) 0.083% nebulizer solution Take 2.5 mg by nebulization every 6 (six) hours as needed for shortness of breath.    Yes Historical Provider, MD  amLODipine (NORVASC) 2.5 MG tablet Take 2.5 mg by mouth daily.     Yes Historical Provider, MD  aspirin EC 81 MG tablet Take 81 mg by mouth daily.  Yes Historical Provider, MD  cyclobenzaprine (FLEXERIL) 10 MG tablet Take 5-10 mg by mouth 3 (three) times daily as needed for muscle spasms.    Yes Historical Provider, MD  ezetimibe (ZETIA) 10 MG tablet Take 10 mg by mouth daily.     Yes Historical Provider, MD  Fluticasone-Salmeterol (ADVAIR DISKUS) 250-50 MCG/DOSE AEPB Inhale 1 puff into the lungs every 12 (twelve) hours.   Yes Historical Provider, MD  glipiZIDE (GLUCOTROL) 5 MG tablet Take 5 mg by mouth daily.     Yes Historical Provider, MD  ibuprofen (ADVIL,MOTRIN) 200 MG tablet Take 200 mg by mouth every 8 (eight) hours as needed for pain.    Yes Historical Provider, MD  insulin glargine (LANTUS SOLOSTAR) 100 UNIT/ML injection Inject 10 Units into the skin at bedtime.    Yes Historical Provider, MD  isosorbide mononitrate (IMDUR) 30 MG 24  hr tablet Take 1 tablet (30 mg total) by mouth daily. 01/12/13  Yes Sam Ethelda Chick, MD  JENTADUETO 2.03-999 MG TABS Take 1 tablet by mouth 2 (two) times daily.  08/16/11  Yes Historical Provider, MD  meperidine (DEMEROL) 50 MG tablet Take 50 mg by mouth every 4 (four) hours as needed for pain.    Yes Historical Provider, MD  metoprolol tartrate (LOPRESSOR) 25 MG tablet Take 25 mg by mouth 2 (two) times daily.     Yes Historical Provider, MD  nitroGLYCERIN (NITROSTAT) 0.4 MG SL tablet Place 0.4 mg under the tongue every 5 (five) minutes as needed. For chest pain.   Yes Historical Provider, MD  oxyCODONE-acetaminophen (PERCOCET/ROXICET) 5-325 MG per tablet Take 1 tablet by mouth every 6 (six) hours as needed for pain.    Yes Historical Provider, MD  polyethylene glycol powder (MIRALAX) powder Take 17 g by mouth daily.     Yes Historical Provider, MD  prasugrel (EFFIENT) 10 MG TABS Take 10 mg by mouth daily.   Yes Historical Provider, MD  pregabalin (LYRICA) 75 MG capsule Take 75 mg by mouth 2 (two) times daily.     Yes Historical Provider, MD  rosuvastatin (CRESTOR) 20 MG tablet Take 1 tablet (20 mg total) by mouth daily. 12/20/12  Yes Kathleen Hazel, MD  timolol (BETIMOL) 0.5 % ophthalmic solution Place 1 drop into the right eye 2 (two) times daily.     Yes Historical Provider, MD  tolterodine (DETROL LA) 4 MG 24 hr capsule Take 4 mg by mouth daily.     Yes Historical Provider, MD  travoprost, benzalkonium, (TRAVATAN) 0.004 % ophthalmic solution Place 1 drop into both eyes at bedtime.     Yes Historical Provider, MD   Physical Exam: Filed Vitals:   04/02/13 1547 04/02/13 1548 04/02/13 1850 04/02/13 2000  BP:  135/66    Pulse: 103     Temp: 98.2 F (36.8 C)     TempSrc: Oral     Resp: 18   24  Height: 5\' 4"  (1.626 m)  5\' 5"  (1.651 m)   Weight: 65.318 kg (144 lb)  65.318 kg (144 lb)   SpO2: 95%       General:  NAD, resting comfortably in bed Eyes: PEERLA EOMI ENT: mucous membranes  moist Neck: supple w/o JVD Cardiovascular: RRR w/o MRG Respiratory: CTA B Abdomen: soft, nt, nd, bs+ Skin: no rash nor lesion Musculoskeletal: MAE, full ROM all 4 extremities Psychiatric: normal tone and affect Neurologic: AAOx3, grossly non-focal  Labs on Admission:  Basic Metabolic Panel:  Recent Labs Lab 04/02/13 1615 04/02/13 1725  NA 126*  --   K 3.4* 3.2*  CL 88*  --   CO2 26  --   GLUCOSE 655*  --   BUN 10  --   CREATININE 0.57  --   CALCIUM 9.3  --    Liver Function Tests:  Recent Labs Lab 04/02/13 1615  AST 29  ALT 18  ALKPHOS 131*  BILITOT 0.3  PROT 6.9  ALBUMIN 3.2*   No results found for this basename: LIPASE, AMYLASE,  in the last 168 hours No results found for this basename: AMMONIA,  in the last 168 hours CBC:  Recent Labs Lab 04/02/13 1615  WBC 9.1  NEUTROABS 6.3  HGB 15.1*  HCT 42.9  MCV 92.9  PLT 179   Cardiac Enzymes: No results found for this basename: CKTOTAL, CKMB, CKMBINDEX, TROPONINI,  in the last 168 hours  BNP (last 3 results) No results found for this basename: PROBNP,  in the last 8760 hours CBG:  Recent Labs Lab 04/02/13 1551 04/02/13 1832 04/02/13 1956 04/02/13 2100  GLUCAP >600* 444* 353* 288*    Radiological Exams on Admission: Dg Chest 2 View  04/02/2013   *RADIOLOGY REPORT*  Clinical Data: Hyperglycemia.  Weakness.  CHEST - 2 VIEW  Comparison: 01/11/2013  Findings: Artifact overlies chest.  Right testis clear.  There is chronic pleural and parenchymal scarring on the left.  No evidence of active infiltrate, effusion or collapse.  Ordinary degenerative changes effect the spine.  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  No active process.   Original Report Authenticated By: Paulina Fusi, M.D.    EKG: Independently reviewed.  Assessment/Plan Principal Problem:   Hyperglycemia Active Problems:   DM2 (diabetes mellitus, type 2)   Hypokalemia   1. Hyperglycemia - No DKA so could  normally treat with single dose of insulin but this would significantly worsen hypokalemia so admitting and treating with insulin gtt. 2. DM2 - poorly controlled, have ordered DM nurse coordinator consultation regarding discussion of insulin and treatment, suspicious that patient will end up needing short acting coverage added to regimen.  For now being treated with insulin gtt. 3. Hypokalemia - replacing with PO and IV repeat BMP in AM.    Code Status: Full Code (must indicate code status--if unknown or must be presumed, indicate so) Family Communication: Spoke with daughter (indicate person spoken with, if applicable, with phone number if by telephone) Disposition Plan: Admit to obs (indicate anticipated LOS)  Time spent: 50 min  Allen Basista M. [facility] Pager [phone]  If 7PM-7AM, please contact night-coverage [email or web address] Password [facility] 04/02/2013, 9:11 PM

## 2013-04-02 NOTE — ED Notes (Signed)
Pt states was sent here by PCP for high HgA1C and high blood sugar, states was 600's, pt denies having symptoms.

## 2013-04-02 NOTE — ED Provider Notes (Addendum)
History     CSN: [id number]  Arrival date & time 04/02/13  1528   First MD Initiated Contact with Patient 04/02/13 1552      Chief Complaint  Patient presents with  . Hyperglycemia    (Consider location/radiation/quality/duration/timing/severity/associated sxs/prior treatment) The history is provided by the patient and a relative.   patient was sent in by primary care Dr. Rebbeca Paul has had hemoglobin A1c of 13 or 14. Sugar has been greater than 600 at the doctor's office today. Patient states his been doing well at home but there is questionable whether she's been checking it. Daughter states the patient's glucometer was lost for a while. Patient states she's been checking her sugars and has been giving herself a sliding scale insulin. She also states she was trying to figure it out, but she is not very smart on how to do it. She states she has been urinating somewhat frequently but has been doing that for years. Daughter states it has been more frequent recently. She's been somewhat fatigued. Chest compared to cough. No abdominal pain. A few weeks ago she had her nausea and vomiting.  Past Medical History  Diagnosis Date  . CAD (coronary artery disease)     s/p inferior STEMI 12/29/10 with rotablator atherectomy RCA 12/31/10 and DES x 2 RCA  . Ischemic cardiomyopathy     EF 45% by cath 12/29/10.   . Diabetes mellitus     type 2  . HTN (hypertension)   . Hyperlipidemia   . Diverticulosis   . Asthma   . COPD (chronic obstructive pulmonary disease)   . Small bowel obstruction     from a sigmoid stricture  . Nephrolithiasis   . Sleep apnea   . Overweight   . Osteoarthritis   . GERD (gastroesophageal reflux disease)   . Anxiety   . Glaucoma(365)   . History of colonoscopy     Past Surgical History  Procedure Laterality Date  . Bilateral tubal ligation    . Lithotripsy    . 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 Cardiac surgery      Family  History  Problem Relation Age of Onset  . Breast cancer Maternal Grandmother   . Diabetes Father   . Diabetes Sister   . Heart disease Father   . Colon cancer Neg Hx     History  Substance Use Topics  . Smoking status: Current Some Day Smoker -- 1.00 packs/day for 25 years    Types: Cigarettes    Last Attempt to Quit: 10/20/2004  . Smokeless tobacco: Never Used     Comment: over a ppd for 40+ years; quit 10/2004  . Alcohol Use: 0.5 oz/week    1 drink(s) per week     Comment: rare     OB History   Grav Para Term Preterm Abortions TAB SAB Ect Mult Living                  Review of Systems  Constitutional: Positive for fatigue. Negative for activity change and appetite change.  HENT: Negative for neck stiffness.   Eyes: Negative for pain.  Respiratory: Negative for chest tightness and shortness of breath.   Cardiovascular: Negative for chest pain and leg swelling.  Gastrointestinal: Negative for nausea, vomiting, abdominal pain and diarrhea.  Endocrine: Positive for polyuria. Negative for cold intolerance, heat intolerance, polydipsia and polyphagia.  Genitourinary: Positive for frequency. Negative for flank pain.  Musculoskeletal: Negative for back  pain.  Skin: Negative for rash.  Neurological: Negative for weakness, numbness and headaches.  Psychiatric/Behavioral: Negative for behavioral problems.       Family states the patient has been somewhat more confused for months. They've attempted to get MRIs but she is not able to tolerate and is scheduled to get on under sedation.    Allergies  Lisinopril; Erythromycin; Iodine; Penicillins; Pentazocine lactate; Propoxyphene hcl; Quinapril hcl; and Sulfonamide derivatives  Home Medications   No current outpatient prescriptions on file.  BP 108/59  Pulse 75  Temp(Src) 97.7 F (36.5 C) (Oral)  Resp 24  Ht 5\' 5"  (1.651 m)  Wt 144 lb 6.4 oz (65.5 kg)  BMI 24.03 kg/m2  SpO2 95%  Physical Exam  Constitutional: She  appears well-developed and well-nourished.  Eyes: Pupils are equal, round, and reactive to light.  Cardiovascular: Normal rate.   Pulmonary/Chest: Effort normal and breath sounds normal.  Abdominal: Soft. Bowel sounds are normal.  Musculoskeletal: Normal range of motion.  Neurological: She is alert.  Skin: Skin is warm and dry.    ED Course  Procedures (including critical care time)  Labs Reviewed  GLUCOSE, CAPILLARY - Abnormal; Notable for the following:    Glucose-Capillary >600 (*)    All other components within normal limits  CBC WITH DIFFERENTIAL - Abnormal; Notable for the following:    Hemoglobin 15.1 (*)    All other components within normal limits  BLOOD GAS, VENOUS - Abnormal; Notable for the following:    pH, Ven 7.429 (*)    pCO2, Ven 39.3 (*)    pO2, Ven 51.6 (*)    Bicarbonate 25.6 (*)    All other components within normal limits  COMPREHENSIVE METABOLIC PANEL - Abnormal; Notable for the following:    Sodium 126 (*)    Potassium 3.4 (*)    Chloride 88 (*)    Glucose, Bld 655 (*)    Albumin 3.2 (*)    Alkaline Phosphatase 131 (*)    All other components within normal limits  URINALYSIS, ROUTINE W REFLEX MICROSCOPIC - Abnormal; Notable for the following:    Specific Gravity, Urine 1.036 (*)    Glucose, UA >1000 (*)    All other components within normal limits  POTASSIUM - Abnormal; Notable for the following:    Potassium 3.2 (*)    All other components within normal limits  URINE MICROSCOPIC-ADD ON - Abnormal; Notable for the following:    Squamous Epithelial / LPF MANY (*)    Bacteria, UA FEW (*)    All other components within normal limits  GLUCOSE, CAPILLARY - Abnormal; Notable for the following:    Glucose-Capillary 444 (*)    All other components within normal limits  GLUCOSE, CAPILLARY - Abnormal; Notable for the following:    Glucose-Capillary 353 (*)    All other components within normal limits  GLUCOSE, CAPILLARY - Abnormal; Notable for the  following:    Glucose-Capillary 288 (*)    All other components within normal limits  GLUCOSE, CAPILLARY - Abnormal; Notable for the following:    Glucose-Capillary 239 (*)    All other components within normal limits   Dg Chest 2 View  04/02/2013   *RADIOLOGY REPORT*  Clinical Data: Hyperglycemia.  Weakness.  CHEST - 2 VIEW  Comparison: 01/11/2013  Findings: Artifact overlies chest.  Right testis clear.  There is chronic pleural and parenchymal scarring on the left.  No evidence of active infiltrate, effusion or collapse.  Ordinary degenerative changes effect the  spine.  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  No active process.   Original Report Authenticated By: Paulina Fusi, M.D.     1. Hyperglycemia   2. Hypokalemia      Date: 04/02/2013  Rate: 85  Rhythm: normal sinus rhythm and premature ventricular contractions (PVC)  QRS Axis: normal  Intervals: normal  ST/T Wave abnormalities: normal  Conduction Disutrbances:none  Narrative Interpretation:   Old EKG Reviewed: unchanged    MDM  Patient with hyperglycemia. Likely due to poor compliance. Also has hypokalemia. With the insulin she'll be getting for the hyperglycemia the hypokalemia will likely worsen. She'll be admitted for further monitoring        Harrold Donath R. Rubin Payor, MD 04/02/13 2352  Juliet Rude. Rubin Payor, MD 04/18/13 2311

## 2013-04-03 DIAGNOSIS — I2510 Atherosclerotic heart disease of native coronary artery without angina pectoris: Secondary | ICD-10-CM

## 2013-04-03 DIAGNOSIS — I10 Essential (primary) hypertension: Secondary | ICD-10-CM | POA: Diagnosis present

## 2013-04-03 DIAGNOSIS — E119 Type 2 diabetes mellitus without complications: Secondary | ICD-10-CM

## 2013-04-03 DIAGNOSIS — I25810 Atherosclerosis of coronary artery bypass graft(s) without angina pectoris: Secondary | ICD-10-CM

## 2013-04-03 DIAGNOSIS — E785 Hyperlipidemia, unspecified: Secondary | ICD-10-CM | POA: Diagnosis present

## 2013-04-03 DIAGNOSIS — J441 Chronic obstructive pulmonary disease with (acute) exacerbation: Secondary | ICD-10-CM

## 2013-04-03 DIAGNOSIS — F0390 Unspecified dementia without behavioral disturbance: Secondary | ICD-10-CM

## 2013-04-03 HISTORY — DX: Atherosclerotic heart disease of native coronary artery without angina pectoris: I25.10

## 2013-04-03 LAB — GLUCOSE, CAPILLARY
Glucose-Capillary: 166 mg/dL — ABNORMAL HIGH (ref 70–99)
Glucose-Capillary: 195 mg/dL — ABNORMAL HIGH (ref 70–99)
Glucose-Capillary: 263 mg/dL — ABNORMAL HIGH (ref 70–99)
Glucose-Capillary: 181 mg/dL — ABNORMAL HIGH (ref 70–99)
Glucose-Capillary: 136 mg/dL — ABNORMAL HIGH (ref 70–99)
Glucose-Capillary: 149 mg/dL — ABNORMAL HIGH (ref 70–99)
Glucose-Capillary: 149 mg/dL — ABNORMAL HIGH (ref 70–99)
Glucose-Capillary: 271 mg/dL — ABNORMAL HIGH (ref 70–99)
Glucose-Capillary: 296 mg/dL — ABNORMAL HIGH (ref 70–99)

## 2013-04-03 LAB — CBC
HCT: 43.2 % (ref 36.0–46.0)
Hemoglobin: 15.2 g/dL — ABNORMAL HIGH (ref 12.0–15.0)
MCH: 32.9 pg (ref 26.0–34.0)
MCHC: 35.2 g/dL (ref 30.0–36.0)
MCV: 93.5 fL (ref 78.0–100.0)
Platelets: 159 10*3/uL (ref 150–400)
RBC: 4.62 MIL/uL (ref 3.87–5.11)
RDW: 12.7 % (ref 11.5–15.5)
WBC: 8.6 10*3/uL (ref 4.0–10.5)

## 2013-04-03 LAB — BASIC METABOLIC PANEL
BUN: 7 mg/dL (ref 6–23)
CO2: 28 mEq/L (ref 19–32)
Calcium: 9.2 mg/dL (ref 8.4–10.5)
Chloride: 102 mEq/L (ref 96–112)
Creatinine, Ser: 0.61 mg/dL (ref 0.50–1.10)
GFR calc Af Amer: >90 mL/min (ref >90)
GFR calc non Af Amer: 89 mL/min — ABNORMAL LOW (ref >90)
Glucose, Bld: 91 mg/dL (ref 70–99)
Potassium: 3.2 mEq/L — ABNORMAL LOW (ref 3.5–5.1)
Sodium: 137 mEq/L (ref 135–145)

## 2013-04-03 LAB — HEMOGLOBIN A1C: Hgb A1c MFr Bld: 13 % — ABNORMAL HIGH (ref <5.7)

## 2013-04-03 MED ORDER — ONDANSETRON HCL 4 MG/2ML IJ SOLN
4.0000 mg | Freq: Four times a day (QID) | INTRAMUSCULAR | Status: DC | PRN
  Administered 2013-04-03: 4 mg via INTRAVENOUS
  Filled 2013-04-03: qty 2

## 2013-04-03 MED ORDER — CALCIUM CARBONATE ANTACID 500 MG PO CHEW
400.0000 mg | CHEWABLE_TABLET | Freq: Three times a day (TID) | ORAL | Status: DC | PRN
  Administered 2013-04-03: 400 mg via ORAL
  Filled 2013-04-03 (×2): qty 2

## 2013-04-03 MED ORDER — PROMETHAZINE HCL 25 MG/ML IJ SOLN
12.5000 mg | Freq: Once | INTRAMUSCULAR | Status: AC
  Administered 2013-04-03: 12.5 mg via INTRAVENOUS
  Filled 2013-04-03: qty 1

## 2013-04-03 MED ORDER — INSULIN GLARGINE 100 UNIT/ML ~~LOC~~ SOLN
5.0000 [IU] | Freq: Once | SUBCUTANEOUS | Status: AC
  Administered 2013-04-03: 5 [IU] via SUBCUTANEOUS
  Filled 2013-04-03 (×2): qty 0.05

## 2013-04-03 MED ORDER — LIVING WELL WITH DIABETES BOOK
Freq: Once | Status: AC
  Administered 2013-04-03: 15:00:00
  Filled 2013-04-03: qty 1

## 2013-04-03 MED ORDER — INSULIN ASPART 100 UNIT/ML ~~LOC~~ SOLN
0.0000 [IU] | Freq: Three times a day (TID) | SUBCUTANEOUS | Status: DC
  Administered 2013-04-03: 2 [IU] via SUBCUTANEOUS
  Administered 2013-04-03 – 2013-04-04 (×3): 8 [IU] via SUBCUTANEOUS

## 2013-04-03 MED ORDER — POTASSIUM CHLORIDE CRYS ER 20 MEQ PO TBCR
40.0000 meq | EXTENDED_RELEASE_TABLET | Freq: Once | ORAL | Status: AC
  Administered 2013-04-03: 40 meq via ORAL
  Filled 2013-04-03: qty 2

## 2013-04-03 MED ORDER — INSULIN ASPART 100 UNIT/ML ~~LOC~~ SOLN
0.0000 [IU] | Freq: Every day | SUBCUTANEOUS | Status: DC
  Administered 2013-04-03: 3 [IU] via SUBCUTANEOUS

## 2013-04-03 MED ORDER — POLYETHYLENE GLYCOL 3350 17 G PO PACK
17.0000 g | PACK | Freq: Every day | ORAL | Status: DC
  Administered 2013-04-03 – 2013-04-04 (×2): 17 g via ORAL
  Filled 2013-04-03 (×2): qty 1
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
## 2013-04-03 NOTE — Progress Notes (Signed)
Inpatient Diabetes Program Recommendations  AACE/ADA: New Consensus Statement on Inpatient Glycemic Control (2013)  Target Ranges:  Prepandial:   less than 140 mg/dL      Peak postprandial:   less than 180 mg/dL (1-2 hours)      Critically ill patients:  140 - 180 mg/dL   Reason for Visit: Consult for hyperglycemia, uncontrolled DM with HgbA1C of 13.0%   Spoke with pt and dtr regarding diabetes.  Discussed A1C results and explained what HgbA1C is, basic pathophysiology of DM2, importance of monitoring blood sugars, and maintaining good control to prevent long and short term complications.  When asked about dietary behaviors, pt reports she drinks regular Dr. Reino Kent soft drinks, some days she might drink 4-6 bottles.  States she checks blood sugars every morning before breakfast and usually runs 120 - 200mg /dL.  Uses insulin pen for Lantus 10 units QAM and reports that on occasion she "doesn't get all the insulin inside my body." Instructed on proper technique of pen use and demonstrated importance of waiting to inject after pen needle is below skin surface.    Does not report problems in obtaining insulin or meter supplies.  Sees Dr. Cliffton Asters at [facility] for PCP.  Instructed pt to check blood sugars 3 -4 times per day and take logbook to MD appt for adjustments.  Also on Jentadueto 2.5/1000mg  bid along with Lantus 10 units QAM.  Would likely benefit from addition of rapid-acting insulin at mealtimes.  Discussed with pt and she is willing to monitor more frequently and increase insulin injections if needed. Results for JAKARA, BLATTER (MRN [id number]) as of 04/03/2013 14:37  Ref. Range 04/03/2013 07:45  Hemoglobin A1C Latest Range: <5.7 % 13.0 (H)  Results for CALIA, NAPP (MRN [id number]) as of 04/03/2013 14:37  Ref. Range 04/03/2013 07:45  Sodium Latest Range: 135-145 mEq/L 137  Potassium Latest Range: 3.5-5.1 mEq/L 3.2 (L)  Chloride Latest Range: 96-112 mEq/L 102  CO2 Latest Range: 19-32 mEq/L 28   Mean Plasma Glucose Latest Range: <117 mg/dL 782 (H)  BUN Latest Range: 6-23 mg/dL 7  Creatinine Latest Range: 0.50-1.10 mg/dL 9.56  Calcium Latest Range: 8.4-10.5 mg/dL 9.2  GFR calc non Af Amer Latest Range: >90 mL/min 89 (L)  GFR calc Af Amer Latest Range: >90 mL/min >90  Glucose Latest Range: 70-99 mg/dL 91    Inpatient Diabetes Program Recommendations Insulin - IV drip/GlucoStabilizer: GlucoStabilizer discontinued after Lantus 5 units given. Insulin - Basal: Increase Lantus to home dose of 10 units QAM. Insulin - Meal Coverage: May need meal coverage insulin at home or correction insulin (with sliding scale to follow) HgbA1C: 13.0% - poor control at home  Note: Discussed above with RN.  Will order Living Well With Diabetes book and encouraged pt to view diabetes videos on pt ed channel.  Thank you. Ailene Ards, RD, LDN, CDE Inpatient Diabetes Coordinator [phone]
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
## 2013-04-03 NOTE — Evaluation (Signed)
Physical Therapy Evaluation Patient Details Name: [name] MRN: [id number] DOB: Sep 23, 1941 Today's Date: 04/03/2013 Time: 4098-1191 PT Time Calculation (min): 30 min  PT Assessment / Plan / Recommendation Clinical Impression  72 yo female admitted  with hyperglycemia on 04/02/13. Pt lives alone a nd has neighbor and daughter check in on pt. Daughter reports pt has been having memory issues, gets lost when driving. Daughter declines need for SNF at DC . PT strongly encouraged that pt have 24/7 supervion as pt today is weak. Pt will benefit from PT while in acute care. Rec. HHPT for safety and forimproving functional independence. Pt does have weakness of R ankle. daughter reports noting  pt having R steppage. PT did not notice but will observe more closely next visist. Pt was limited by nausea.    PT Assessment  Patient needs continued PT services    Follow Up Recommendations  Home health PT;Supervision/Assistance - 24 hour    Does the patient have the potential to tolerate intense rehabilitation      Barriers to Discharge Decreased caregiver support      Equipment Recommendations  Rolling walker with 5" wheels    Recommendations for Other Services     Frequency Min 3X/week    Precautions / Restrictions Precautions Precautions: Fall Precaution Comments: safety, Restrictions Weight Bearing Restrictions: No   Pertinent Vitals/Pain      Mobility  Bed Mobility Bed Mobility: Supine to Sit;Sit to Supine Supine to Sit: 4: Min assist;HOB flat Details for Bed Mobility Assistance: some difficulty getting to upright position from L side. Transfers Transfers: Sit to Stand;Stand to Sit Sit to Stand: 4: Min guard;From bed;From toilet;With upper extremity assist Stand to Sit: 5: Supervision;To chair/3-in-1;To toilet;With upper extremity assist Details for Transfer Assistance: pt got self from toilet and bed with no assistance. Ambulation/Gait Ambulation/Gait Assistance: 4: Min  assist Ambulation Distance (Feet): 20 Feet (and 10') Assistive device: Rolling walker Ambulation/Gait Assistance Details: pt held onto bed , IV pole wall to walk into bathroom, used RW x 20 '  into hall. Limited by nausea. Gait Pattern: Step-through pattern Gait velocity: decr General Gait Details: pt. is unsteady without UE support.    Exercises     PT Diagnosis: Difficulty walking;Generalized weakness  PT Problem List: Decreased strength;Decreased range of motion;Decreased knowledge of use of DME;Decreased knowledge of precautions;Decreased activity tolerance;Decreased balance;Decreased mobility PT Treatment Interventions: DME instruction;Gait training;Functional mobility training;Therapeutic activities;Therapeutic exercise;Patient/family education   PT Goals Acute Rehab PT Goals PT Goal Formulation: With patient/family Time For Goal Achievement: 04/17/13 Potential to Achieve Goals: Good Pt will go Supine/Side to Sit: Independently PT Goal: Supine/Side to Sit - Progress: Goal set today Pt will go Sit to Supine/Side: Independently PT Goal: Sit to Supine/Side - Progress: Goal set today Pt will go Sit to Stand: with modified independence Pt will go Stand to Sit: with modified independence PT Goal: Stand to Sit - Progress: Goal set today Pt will Ambulate: >150 feet;with modified independence;with least restrictive assistive device PT Goal: Ambulate - Progress: Goal set today  Visit Information  Last PT Received On: 04/03/13 Assistance Needed: +1    Subjective Data  Subjective: i just feel nausea. Patient Stated Goal: to go home   Prior Functioning  Home Living Lives With: Alone Available Help at Discharge: Family;Friend(s);Available PRN/intermittently Type of Home: House Home Access: Stairs to enter [employer] of Steps: 1 Entrance Stairs-Rails: Right Home Layout: One level Bathroom Shower/Tub: Engineer, manufacturing systems: Standard Home Adaptive Equipment:  Dan Humphreys -  rolling Prior Function Level of Independence: Independent Able to Take Stairs?: Yes Driving: Yes Communication Communication: No difficulties    Cognition  Cognition Arousal/Alertness: Awake/alert Behavior During Therapy: WFL for tasks assessed/performed Overall Cognitive Status: History of cognitive impairments - at baseline Area of Impairment: Memory;Safety/judgement;Problem solving Memory: Decreased short-term memory Safety/Judgement: Decreased awareness of safety;Decreased awareness of deficits General Comments: per daughter, pt has had memory difficulties, loses way when driving, questionable if managing meds. Daughter states PC is looking onto this and is to have an MRI. for w/u.    Extremity/Trunk Assessment Right Upper Extremity Assessment RUE ROM/Strength/Tone: WFL for tasks assessed Left Upper Extremity Assessment LUE ROM/Strength/Tone: WFL for tasks assessed Right Lower Extremity Assessment RLE ROM/Strength/Tone: Deficits RLE ROM/Strength/Tone Deficits: dorsiflex 3+, o/w 4+ hip and knee RLE Sensation: WFL - Light Touch Left Lower Extremity Assessment LLE ROM/Strength/Tone: WFL for tasks assessed LLE Sensation: WFL - Light Touch Trunk Assessment Trunk Assessment: Normal   Balance Balance Balance Assessed: Yes Static Standing Balance Static Standing - Balance Support: Right upper extremity supported Static Standing - Level of Assistance: 5: Stand by assistance Static Standing - Comment/# of Minutes: remote supervision as pt. got up from toilet and washed hands   End of Session PT - End of Session Activity Tolerance: Patient limited by fatigue Patient left: in chair;with call bell/phone within reach;with family/visitor present Nurse Communication: Mobility status  GP     Rada Hay 04/03/2013, 10:01 AM
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
## 2013-04-03 NOTE — Progress Notes (Signed)
INITIAL NUTRITION ASSESSMENT  DOCUMENTATION CODES Per approved criteria  -Not Applicable   INTERVENTION: - Encouraged continued excellent PO intake - Educated pt on diabetic diet - Will continue to monitor   NUTRITION DIAGNOSIS: Food and nutrition knowledge related deficit related to diabetic diet as evidenced by pt statement.   Goal: Used teach back method to educate pt and family on how to follow diabetic diet and improve blood sugar control.   Monitor:  Weights, labs, intake  Reason for Assessment: Nutrition risk   72 y.o. female  Admitting Dx: Hyperglycemia  ASSESSMENT: Pt with history of diabetes, admitted with hyperglycemia with blood sugars > 600 mg/dL. Pt's HbA1c was 13. Met with pt and daughter who report pt eating 2 meals/day at home but admits to sometimes skipping meals. Pt reports 40 pound unintentional weight loss since Jan 05, 2013r/t death of her husband. Pt reports her weight has been stable for the past few months.     Height: Ht Readings from Last 1 Encounters:  04/02/13 5\' 5"  (1.651 m)    Weight: Wt Readings from Last 1 Encounters:  04/02/13 144 lb 6.4 oz (65.5 kg)    Ideal Body Weight: 125 lb  % Ideal Body Weight: 115  Wt Readings from Last 10 Encounters:  04/02/13 144 lb 6.4 oz (65.5 kg)  01/11/13 146 lb (66.225 kg)  12/19/12 146 lb (66.225 kg)  2012-11-24 140 lb 6.4 oz (63.685 kg)  05/14/12 169 lb (76.658 kg)  09/20/11 178 lb 3.2 oz (80.831 kg)  04/06/11 177 lb 12.8 oz (80.65 kg)  01/12/11 174 lb 12 oz (79.266 kg)  08/02/10 184 lb 6.1 oz (83.635 kg)  07/14/10 187 lb 2.1 oz (84.882 kg)    Usual Body Weight: 184 lb per pt  % Usual Body Weight: 78  BMI:  Body mass index is 24.03 kg/(m^2).  Estimated Nutritional Needs: Kcal: 1625-1950 Protein: 65-80g Fluid: 1.6-1.9L/day  Skin: Intact   Diet Order: Carb Control  EDUCATION NEEDS: -Education needs addressed - discussed diabetic diet and ways to improve blood sugar control    No intake or output data in the 24 hours ending 04/03/13 1216  Last BM: 5/15  Labs:   Recent Labs Lab 04/02/13 1615 04/02/13 1725 04/03/13 0745  NA 126*  --  137  K 3.4* 3.2* 3.2*  CL 88*  --  102  CO2 26  --  28  BUN 10  --  7  CREATININE 0.57  --  0.61  CALCIUM 9.3  --  9.2  GLUCOSE 655*  --  91    CBG (last 3)   Recent Labs  04/03/13 0808 04/03/13 0922 04/03/13 1103  GLUCAP 149* 149* 138*    Scheduled Meds: . sodium chloride   Intravenous STAT  . amLODipine  2.5 mg Oral Daily  . aspirin EC  81 mg Oral Daily  . atorvastatin  40 mg Oral q1800  . ezetimibe  10 mg Oral Daily  . fesoterodine  8 mg Oral Daily  . insulin aspart  0-15 Units Subcutaneous TID WC  . insulin aspart  0-5 Units Subcutaneous QHS  . insulin regular  0-10 Units Intravenous TID WC  . isosorbide mononitrate  30 mg Oral Daily  . metoprolol tartrate  25 mg Oral BID  . mometasone-formoterol  2 puff Inhalation BID  . polyethylene glycol  17 g Oral Daily  . prasugrel  10 mg Oral Daily  . pregabalin  75 mg Oral BID  . timolol  1 drop  Right Eye BID  . Travoprost (BAK Free)  1 drop Both Eyes QHS    Continuous Infusions: . sodium chloride 75 mL/hr at 04/03/13 0533  . insulin (NOVOLIN-R) infusion 2.3 Units/hr (04/03/13 1105)    Past Medical History  Diagnosis Date  . CAD (coronary artery disease)     s/p inferior STEMI 12/29/10 with rotablator atherectomy RCA 12/31/10 and DES x 2 RCA  . Ischemic cardiomyopathy     EF 45% by cath 12/29/10.   . Diabetes mellitus     type 2  . HTN (hypertension)   . Hyperlipidemia   . Diverticulosis   . Asthma   . COPD (chronic obstructive pulmonary disease)   . Small bowel obstruction     from a sigmoid stricture  . Nephrolithiasis   . Sleep apnea   . Overweight   . Osteoarthritis   . GERD (gastroesophageal reflux disease)   . Anxiety   . Glaucoma(365)   . History of colonoscopy     Past Surgical History  Procedure Laterality Date  . Bilateral  tubal ligation    . Lithotripsy    . 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 Cardiac surgery       Levon Hedger MS, RD, LDN [phone] Pager [phone] After Hours Pager

## 2013-04-04 LAB — BASIC METABOLIC PANEL
CO2: 21 mEq/L (ref 19–32)
Calcium: 8.6 mg/dL (ref 8.4–10.5)
Creatinine, Ser: 0.65 mg/dL (ref 0.50–1.10)
GFR calc Af Amer: >90 mL/min (ref >90)
Sodium: 132 mEq/L — ABNORMAL LOW (ref 135–145)

## 2013-04-04 MED ORDER — INSULIN GLARGINE 100 UNIT/ML ~~LOC~~ SOLN
10.0000 [IU] | Freq: Every day | SUBCUTANEOUS | Status: DC
  Filled 2013-04-04: qty 0.1

## 2013-04-04 MED ORDER — INSULIN ASPART 100 UNIT/ML ~~LOC~~ SOLN
5.0000 [IU] | Freq: Three times a day (TID) | SUBCUTANEOUS | Status: DC
  Administered 2013-04-04 (×2): 5 [IU] via SUBCUTANEOUS

## 2013-04-04 MED ORDER — INSULIN ASPART 100 UNIT/ML FLEXPEN: 5.0000 [IU] | Freq: Three times a day (TID) | SUBCUTANEOUS | Status: DC

## 2013-04-04 NOTE — Progress Notes (Signed)
Received call from T. Barfield in the laboratory.  She stated that the pt's potassium level this morning had been reported as 3.5.  She stated the actual level was 4.9 with moderate hemolysis.  Dr. Elisabeth Pigeon notified of this.  No further action necessary from MD standpoint.  Allayne Butcher [facility]  04/04/2013  3:01 PM

## 2013-04-04 NOTE — Progress Notes (Signed)
Inpatient Diabetes Program Recommendations  AACE/ADA: New Consensus Statement on Inpatient Glycemic Control (2013)  Target Ranges:  Prepandial:   less than 140 mg/dL      Peak postprandial:   less than 180 mg/dL (1-2 hours)      Critically ill patients:  140 - 180 mg/dL   Reason for Visit: Referral to OP Diabetes Education  Pt states interest in OP Diabetes Education.  Referral sent to [facility] and pt will be contacted to set up appt. Thank you.  Helyn App, RD, LDN, CDE Inpatient Diabetes Coordinator Inpatient Diabetes Program [phone]

## 2013-04-04 NOTE — Progress Notes (Signed)
Pt selected [facility] for HH needs. Referral given to in house rep with [facility]. 

## 2013-04-04 NOTE — Progress Notes (Signed)
   CARE MANAGEMENT NOTE 04/04/2013  Patient:  [name], [name]   Account Number:  [id number]  Date Initiated:  04/04/2013  Documentation initiated by:  Kendan Cornforth  Subjective/Objective Assessment:   pt with hx of dm admitted with bld glucose greater than 600, on iv insulin drip     Action/Plan:   from alone lives alone.  May need hhc for dm teaching and updating.   Anticipated DC Date:  04/07/2013   Anticipated DC Plan:  HOME/SELF CARE  In-house referral  NA      DC Planning Services  NA      [facility] Choice  NA   Choice offered to / List presented to:  NA   DME arranged  NA      DME agency  NA     HH arranged  NA      HH agency  NA   Status of service:  In process, will continue to follow Medicare Important Message given?  NA - LOS <3 / Initial given by admissions (If response is "NO", the following Medicare IM given date fields will be blank) Date Medicare IM given:   Date Additional Medicare IM given:    Discharge Disposition:    Per UR Regulation:  Reviewed for med. necessity/level of care/duration of stay  If discussed at Long Length of Stay Meetings, dates discussed:    Comments:  [id number] Earlene Plater, RN, BSN, CCM:  CHART REVIEWED AND UPDATED.  Next chart review due on [id number]. NO DISCHARGE NEEDS PRESENT AT THIS TIME. CASE MANAGEMENT [phone]

## 2013-04-04 NOTE — Progress Notes (Signed)
Pt ready for d/c. Instructed pt on home administration of novolog 3x a day before meals, instructed to take blood sugars before meals and to keep a record of blood sugars. Instructed to continue taking lantus and oral hypglycemic agents. Pt to follow-up with outpt diabetes management. Instructed to follow-up with PCP in 1 week. No change in pt status since AM assessment. PIV's d/c'd WNL. Pt d/c'd to home with daughter.
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
## 2013-04-07 ENCOUNTER — Emergency Department (HOSPITAL_COMMUNITY)
Admission: EM | Admit: 2013-04-07 | Discharge: 2013-04-07 | Disposition: A | Discharge status: Home / Self Care | Payer: Medicare Other | Attending: Emergency Medicine | Admitting: Emergency Medicine

## 2013-04-07 ENCOUNTER — Encounter (HOSPITAL_COMMUNITY): Payer: Self-pay

## 2013-04-07 DIAGNOSIS — Z7982 Long term (current) use of aspirin: Secondary | ICD-10-CM | POA: Insufficient documentation

## 2013-04-07 DIAGNOSIS — Z87442 Personal history of urinary calculi: Secondary | ICD-10-CM | POA: Insufficient documentation

## 2013-04-07 DIAGNOSIS — K219 Gastro-esophageal reflux disease without esophagitis: Secondary | ICD-10-CM | POA: Insufficient documentation

## 2013-04-07 DIAGNOSIS — Z9889 Other specified postprocedural states: Secondary | ICD-10-CM | POA: Insufficient documentation

## 2013-04-07 DIAGNOSIS — I10 Essential (primary) hypertension: Secondary | ICD-10-CM | POA: Insufficient documentation

## 2013-04-07 DIAGNOSIS — Z8679 Personal history of other diseases of the circulatory system: Secondary | ICD-10-CM | POA: Insufficient documentation

## 2013-04-07 DIAGNOSIS — E1169 Type 2 diabetes mellitus with other specified complication: Secondary | ICD-10-CM | POA: Insufficient documentation

## 2013-04-07 DIAGNOSIS — Z862 Personal history of diseases of the blood and blood-forming organs and certain disorders involving the immune mechanism: Secondary | ICD-10-CM | POA: Insufficient documentation

## 2013-04-07 DIAGNOSIS — Z7902 Long term (current) use of antithrombotics/antiplatelets: Secondary | ICD-10-CM | POA: Insufficient documentation

## 2013-04-07 DIAGNOSIS — R1031 Right lower quadrant pain: Secondary | ICD-10-CM | POA: Insufficient documentation

## 2013-04-07 DIAGNOSIS — F0390 Unspecified dementia without behavioral disturbance: Secondary | ICD-10-CM | POA: Insufficient documentation

## 2013-04-07 DIAGNOSIS — R739 Hyperglycemia, unspecified: Secondary | ICD-10-CM

## 2013-04-07 DIAGNOSIS — J4489 Other specified chronic obstructive pulmonary disease: Secondary | ICD-10-CM | POA: Insufficient documentation

## 2013-04-07 DIAGNOSIS — Z79899 Other long term (current) drug therapy: Secondary | ICD-10-CM | POA: Insufficient documentation

## 2013-04-07 DIAGNOSIS — J449 Chronic obstructive pulmonary disease, unspecified: Secondary | ICD-10-CM | POA: Insufficient documentation

## 2013-04-07 DIAGNOSIS — R110 Nausea: Secondary | ICD-10-CM | POA: Insufficient documentation

## 2013-04-07 DIAGNOSIS — M1990 Unspecified osteoarthritis, unspecified site: Secondary | ICD-10-CM | POA: Insufficient documentation

## 2013-04-07 DIAGNOSIS — R002 Palpitations: Secondary | ICD-10-CM

## 2013-04-07 DIAGNOSIS — Z794 Long term (current) use of insulin: Secondary | ICD-10-CM | POA: Insufficient documentation

## 2013-04-07 DIAGNOSIS — H409 Unspecified glaucoma: Secondary | ICD-10-CM | POA: Insufficient documentation

## 2013-04-07 DIAGNOSIS — Z9851 Tubal ligation status: Secondary | ICD-10-CM | POA: Insufficient documentation

## 2013-04-07 DIAGNOSIS — F17200 Nicotine dependence, unspecified, uncomplicated: Secondary | ICD-10-CM | POA: Insufficient documentation

## 2013-04-07 DIAGNOSIS — Z880 Allergy status to penicillin: Secondary | ICD-10-CM | POA: Insufficient documentation

## 2013-04-07 DIAGNOSIS — I2510 Atherosclerotic heart disease of native coronary artery without angina pectoris: Secondary | ICD-10-CM | POA: Insufficient documentation

## 2013-04-07 DIAGNOSIS — E119 Type 2 diabetes mellitus without complications: Secondary | ICD-10-CM

## 2013-04-07 DIAGNOSIS — G4730 Sleep apnea, unspecified: Secondary | ICD-10-CM | POA: Insufficient documentation

## 2013-04-07 DIAGNOSIS — E785 Hyperlipidemia, unspecified: Secondary | ICD-10-CM | POA: Insufficient documentation

## 2013-04-07 DIAGNOSIS — Z8639 Personal history of other endocrine, nutritional and metabolic disease: Secondary | ICD-10-CM | POA: Insufficient documentation

## 2013-04-07 DIAGNOSIS — Z8719 Personal history of other diseases of the digestive system: Secondary | ICD-10-CM | POA: Insufficient documentation

## 2013-04-07 LAB — COMPREHENSIVE METABOLIC PANEL
ALT: 16 U/L (ref 0–35)
AST: 19 U/L (ref 0–37)
Calcium: 9.5 mg/dL (ref 8.4–10.5)
Creatinine, Ser: 0.6 mg/dL (ref 0.50–1.10)
GFR calc Af Amer: >90 mL/min (ref >90)
Glucose, Bld: 358 mg/dL — ABNORMAL HIGH (ref 70–99)
Sodium: 136 mEq/L (ref 135–145)
Total Protein: 6.6 g/dL (ref 6.0–8.3)

## 2013-04-07 LAB — CBC WITH DIFFERENTIAL/PLATELET
Basophils Absolute: 0 10*3/uL (ref 0.0–0.1)
Eosinophils Absolute: 0.1 10*3/uL (ref 0.0–0.7)
Eosinophils Relative: 1 % (ref 0–5)
MCH: 33 pg (ref 26.0–34.0)
MCV: 95 fL (ref 78.0–100.0)
Platelets: 197 10*3/uL (ref 150–400)
RDW: 12.8 % (ref 11.5–15.5)

## 2013-04-07 LAB — KETONES, QUALITATIVE: Acetone, Bld: NEGATIVE

## 2013-04-07 LAB — URINALYSIS, ROUTINE W REFLEX MICROSCOPIC
Hgb urine dipstick: NEGATIVE
Leukocytes, UA: NEGATIVE
Specific Gravity, Urine: 1.037 — ABNORMAL HIGH (ref 1.005–1.030)
Urobilinogen, UA: 0.2 mg/dL (ref 0.0–1.0)

## 2013-04-07 LAB — URINE MICROSCOPIC-ADD ON

## 2013-04-07 LAB — POCT I-STAT TROPONIN I: Troponin i, poc: 0.01 ng/mL (ref 0.00–0.08)

## 2013-04-07 LAB — GLUCOSE, CAPILLARY: Glucose-Capillary: 235 mg/dL — ABNORMAL HIGH (ref 70–99)

## 2013-04-07 MED ORDER — SODIUM CHLORIDE 0.9 % IV BOLUS (SEPSIS)
2000.0000 mL | Freq: Once | INTRAVENOUS | Status: AC
  Administered 2013-04-07: 2000 mL via INTRAVENOUS

## 2013-04-07 NOTE — ED Notes (Signed)
Pt c/o feeling anxious with her heart racing, pt in no acute distress. Pt c/o some SOB but denies Chest pain.

## 2013-04-07 NOTE — ED Provider Notes (Signed)
History     CSN: [id number]  Arrival date & time 04/07/13  1738   First MD Initiated Contact with Patient 04/07/13 1810      Chief Complaint  Patient presents with  . Anxiety    "heart racing"    (Consider location/radiation/quality/duration/timing/severity/associated sxs/prior treatment) HPI This 72 year old female has a history of poorly controlled diabetes, also has hypertension, COPD, coronary artery disease, dementia with poor short term memory, chronic right lower quadrant abdominal pain, chronic anxiety, chronic palpitations intermittently for years, recent hospitalization for poorly controlled diabetes and seen by psychiatry who felt the patient was still competent to make her own medical decisions, the patient did not want to go to a skilled nursing facility, the patient was discharged home and has friends or family state with her now 24 hours a day, she was just discharged within the last several days, she returns today because for several hours she felt as if her heart was racing and she had some slight nausea, she states she usually feels that way at least once a month anywhere from several minutes to several hours at a time we'll have a feeling of anxiety, heart racing palpitations feeling sometimes has nausea, there is no change in her baseline right lower quadrant abdominal pain, she is not a threat to herself or others, she is not suicidal homicidal or hallucinating, there is no trauma, she has had no chest pain cough or shortness of breath, she is no change in her mental status and there is no treatment prior to arrival, she now feels back to baseline he does not have any palpitations currently is no longer anxious but has asymptomatic elevated blood sugar upon arrival to the ED.  Past Medical History  Diagnosis Date  . CAD (coronary artery disease)     s/p inferior STEMI 12/29/10 with rotablator atherectomy RCA 12/31/10 and DES x 2 RCA  . Ischemic cardiomyopathy     EF 45% by  cath 12/29/10.   . Diabetes mellitus     type 2  . HTN (hypertension)   . Hyperlipidemia   . Diverticulosis   . Asthma   . COPD (chronic obstructive pulmonary disease)   . Small bowel obstruction     from a sigmoid stricture  . Nephrolithiasis   . Sleep apnea   . Overweight   . Osteoarthritis   . GERD (gastroesophageal reflux disease)   . Anxiety   . Glaucoma(365)   . History of colonoscopy    dementia  Past Surgical History  Procedure Laterality Date  . Bilateral tubal ligation    . Lithotripsy    . 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 Cardiac surgery      Family History  Problem Relation Age of Onset  . Breast cancer Maternal Grandmother   . Diabetes Father   . Diabetes Sister   . Heart disease Father   . Colon cancer Neg Hx     History  Substance Use Topics  . Smoking status: Current Some Day Smoker -- 1.00 packs/day for 25 years    Types: Cigarettes    Last Attempt to Quit: 10/20/2004  . Smokeless tobacco: Never Used     Comment: over a ppd for 40+ years; quit 10/2004  . Alcohol Use: No     Comment: rare     OB History   Grav Para Term Preterm Abortions TAB SAB Ect Mult Living  Review of Systems 10 Systems reviewed and are negative for acute change except as noted in the HPI. Allergies  Lisinopril; Erythromycin; Iodine; Penicillins; Pentazocine lactate; Propoxyphene hcl; Quinapril hcl; and Sulfonamide derivatives  Home Medications   Current Outpatient Rx  Name  Route  Sig  Dispense  Refill  . albuterol (PROVENTIL) (2.5 MG/3ML) 0.083% nebulizer solution   Nebulization   Take 2.5 mg by nebulization every 6 (six) hours as needed for shortness of breath.          Marland Kitchen amLODipine (NORVASC) 2.5 MG tablet   Oral   Take 2.5 mg by mouth daily.           Marland Kitchen aspirin EC 81 MG tablet   Oral   Take 81 mg by mouth daily.         . cyclobenzaprine (FLEXERIL) 10 MG tablet   Oral   Take 5-10 mg by mouth  3 (three) times daily as needed for muscle spasms.          Marland Kitchen ezetimibe (ZETIA) 10 MG tablet   Oral   Take 10 mg by mouth daily.           . Fluticasone-Salmeterol (ADVAIR DISKUS) 250-50 MCG/DOSE AEPB   Inhalation   Inhale 1 puff into the lungs every 12 (twelve) hours.         Marland Kitchen glipiZIDE (GLUCOTROL) 5 MG tablet   Oral   Take 5 mg by mouth daily.           Marland Kitchen ibuprofen (ADVIL,MOTRIN) 200 MG tablet   Oral   Take 200 mg by mouth every 8 (eight) hours as needed for pain.          Marland Kitchen insulin aspart (NOVOLOG) 100 unit/ml SOLN   Subcutaneous   Inject 5 Units into the skin 3 (three) times daily with meals.   1 pen   3   . insulin glargine (LANTUS SOLOSTAR) 100 UNIT/ML injection   Subcutaneous   Inject 10 Units into the skin at bedtime.          . isosorbide mononitrate (IMDUR) 30 MG 24 hr tablet   Oral   Take 1 tablet (30 mg total) by mouth daily.   7 tablet   0   . JENTADUETO 2.03-999 MG TABS   Oral   Take 1 tablet by mouth 2 (two) times daily.          . meperidine (DEMEROL) 50 MG tablet   Oral   Take 50 mg by mouth every 4 (four) hours as needed for pain.          . metoprolol tartrate (LOPRESSOR) 25 MG tablet   Oral   Take 25 mg by mouth 2 (two) times daily.           Marland Kitchen oxyCODONE-acetaminophen (PERCOCET/ROXICET) 5-325 MG per tablet   Oral   Take 1 tablet by mouth every 6 (six) hours as needed for pain.          . polyethylene glycol powder (MIRALAX) powder   Oral   Take 17 g by mouth daily.           . prasugrel (EFFIENT) 10 MG TABS   Oral   Take 10 mg by mouth daily.         . pregabalin (LYRICA) 75 MG capsule   Oral   Take 75 mg by mouth 2 (two) times daily.           . rosuvastatin (CRESTOR) 20  MG tablet   Oral   Take 1 tablet (20 mg total) by mouth daily.   30 tablet   6   . timolol (BETIMOL) 0.5 % ophthalmic solution   Right Eye   Place 1 drop into the right eye 2 (two) times daily.           Marland Kitchen tolterodine (DETROL LA)  4 MG 24 hr capsule   Oral   Take 4 mg by mouth daily.           . nitroGLYCERIN (NITROSTAT) 0.4 MG SL tablet   Sublingual   Place 0.4 mg under the tongue every 5 (five) minutes as needed. For chest pain.           BP 134/77  Pulse 78  Temp(Src) 98 F (36.7 C) (Oral)  Resp 26  SpO2 100%  Physical Exam  Nursing note and vitals reviewed. Constitutional:  Awake, alert, nontoxic appearance.  HENT:  Head: Atraumatic.  Eyes: Right eye exhibits no discharge. Left eye exhibits no discharge.  Neck: Neck supple.  Cardiovascular: Normal rate and regular rhythm.   No murmur heard. Pulmonary/Chest: Effort normal and breath sounds normal. No respiratory distress. She has no wheezes. She has no rales. She exhibits no tenderness.  Abdominal: Soft. Bowel sounds are normal. She exhibits no distension and no mass. There is tenderness. There is no rebound and no guarding.  Baseline mild tenderness right lower quadrant without tenderness the rest of the abdomen and without rebound  Musculoskeletal: She exhibits no tenderness.  Baseline ROM, no obvious new focal weakness.  Neurological: She is alert.  Mental status and motor strength appears baseline for patient and situation. Awake alert pleasant calm cooperative moves all 4 extremities well.  Skin: No rash noted.  Psychiatric: She has a normal mood and affect.    ED Course  Procedures (including critical care time)  IV fluid bolus ordered and labs will be checked due to asymptomatic hyperglycemia.  ECG: Sinus rhythm, ventricular rate 75, normal axis, intervals, no acute ischemic changes noted, no significant change noted compared with 02 Apr 2013  Patient states she has worn cardiac event monitors at home without significant abnormality revealed in the past. Labs Reviewed  GLUCOSE, CAPILLARY - Abnormal; Notable for the following:    Glucose-Capillary 391 (*)    All other components within normal limits  CBC WITH DIFFERENTIAL -  Abnormal; Notable for the following:    Hemoglobin 15.2 (*)    All other components within normal limits  COMPREHENSIVE METABOLIC PANEL - Abnormal; Notable for the following:    Glucose, Bld 358 (*)    Albumin 3.3 (*)    GFR calc non Af Amer 90 (*)    All other components within normal limits  URINALYSIS, ROUTINE W REFLEX MICROSCOPIC - Abnormal; Notable for the following:    Specific Gravity, Urine 1.037 (*)    Glucose, UA >1000 (*)    Ketones, ur 15 (*)    All other components within normal limits  URINE MICROSCOPIC-ADD ON - Abnormal; Notable for the following:    Squamous Epithelial / LPF FEW (*)    Bacteria, UA FEW (*)    All other components within normal limits  GLUCOSE, CAPILLARY - Abnormal; Notable for the following:    Glucose-Capillary 235 (*)    All other components within normal limits  URINE CULTURE  LIPASE, BLOOD  KETONES, QUALITATIVE  POCT I-STAT TROPONIN I   No results found.   1. Palpitations   2.  Hyperglycemia without ketosis   3. Diabetes mellitus       MDM  I doubt any other EMC precluding discharge at this time including, but not necessarily limited to the following:DKA.        Hurman Horn, MD 04/14/13 [phone]

## 2013-04-10 ENCOUNTER — Other Ambulatory Visit (HOSPITAL_COMMUNITY): Payer: Self-pay | Admitting: Family Medicine

## 2013-04-10 DIAGNOSIS — R413 Other amnesia: Secondary | ICD-10-CM

## 2013-04-10 DIAGNOSIS — M549 Dorsalgia, unspecified: Secondary | ICD-10-CM

## 2013-04-10 DIAGNOSIS — M542 Cervicalgia: Secondary | ICD-10-CM

## 2013-04-10 DIAGNOSIS — M21379 Foot drop, unspecified foot: Secondary | ICD-10-CM

## 2013-04-12 LAB — URINE CULTURE: Colony Count: >=100000

## 2013-04-13 ENCOUNTER — Telehealth (HOSPITAL_COMMUNITY): Payer: Self-pay | Admitting: Emergency Medicine

## 2013-04-13 NOTE — Progress Notes (Signed)
ED Antimicrobial Stewardship Positive Culture Follow Up   [name] is an 72 y.o. female who presented to [facility] on 04/07/2013 with a chief complaint of  Chief Complaint  Patient presents with  . Anxiety    "heart racing"    Recent Results (from the past 720 hour(s))  URINE CULTURE     Status: None   Collection Time    04/07/13  6:37 PM      Result Value Range Status   Specimen Description URINE, CLEAN CATCH   Final   Special Requests NONE   Final   Culture  Setup Time 04/08/2013 01:51   Final   Colony Count >=100,000 COLONIES/ML   Final   Culture ENTEROBACTER ASBURIAE   Final   Report Status 04/12/2013 FINAL   Final   Organism ID, Bacteria ENTEROBACTER ASBURIAE   Final     [x]  Patient discharged originally without antimicrobial agent and treatment is now indicated  New antibiotic prescription: ciprofloxacin 250mg  po BID x 3 days  ED Provider: Francee Piccolo, PA-C   Mickeal Skinner 04/13/2013, 9:09 AM Infectious Diseases Pharmacist Phone# [phone]

## 2013-04-13 NOTE — ED Notes (Signed)
Post ED Visit - Positive Culture Follow-up: Successful Patient Follow-Up  Culture assessed and recommendations reviewed by: []  Wes Dulaney, Pharm.D., BCPS [x]  Celedonio Miyamoto, Pharm.D., BCPS []  Georgina Pillion, Pharm.D., BCPS []  [place], [address].D., BCPS, AAHIVP []  Estella Husk, Pharm.D., BCPS, AAHIVP  Positive urine culture  [x]  Patient discharged without antimicrobial prescription and treatment is now indicated []  Organism is resistant to prescribed ED discharge antimicrobial []  Patient with positive blood cultures  Changes discussed with ED provider: Francee Piccolo PA-C New antibiotic prescription: Cipro 250 mg BID x 3 days Called to [facility] [phone]   Contacted patient, date 04/13/13, time 1245   Kylie A Holland 04/13/2013, 4:53 PM

## 2013-04-16 ENCOUNTER — Encounter (HOSPITAL_COMMUNITY): Payer: Self-pay

## 2013-04-16 ENCOUNTER — Ambulatory Visit (HOSPITAL_COMMUNITY): Admission: RE | Admit: 2013-04-16 | Payer: Medicare Other | Source: Ambulatory Visit

## 2013-04-16 ENCOUNTER — Ambulatory Visit (HOSPITAL_COMMUNITY)
Admission: RE | Admit: 2013-04-16 | Discharge: 2013-04-16 | Disposition: A | Discharge status: Home / Self Care | Payer: Medicare Other | Source: Ambulatory Visit | Attending: Family Medicine | Admitting: Family Medicine

## 2013-04-16 ENCOUNTER — Other Ambulatory Visit (HOSPITAL_COMMUNITY): Payer: Self-pay | Admitting: Family Medicine

## 2013-04-16 DIAGNOSIS — M216X9 Other acquired deformities of unspecified foot: Secondary | ICD-10-CM | POA: Insufficient documentation

## 2013-04-16 DIAGNOSIS — G3184 Mild cognitive impairment, so stated: Secondary | ICD-10-CM | POA: Insufficient documentation

## 2013-04-16 DIAGNOSIS — M5020 Other cervical disc displacement, unspecified cervical region: Secondary | ICD-10-CM | POA: Insufficient documentation

## 2013-04-16 DIAGNOSIS — M542 Cervicalgia: Secondary | ICD-10-CM

## 2013-04-16 DIAGNOSIS — M21379 Foot drop, unspecified foot: Secondary | ICD-10-CM

## 2013-04-16 DIAGNOSIS — R413 Other amnesia: Secondary | ICD-10-CM

## 2013-04-16 DIAGNOSIS — M549 Dorsalgia, unspecified: Secondary | ICD-10-CM

## 2013-04-16 DIAGNOSIS — M5126 Other intervertebral disc displacement, lumbar region: Secondary | ICD-10-CM | POA: Insufficient documentation

## 2013-04-16 DIAGNOSIS — Z8673 Personal history of transient ischemic attack (TIA), and cerebral infarction without residual deficits: Secondary | ICD-10-CM | POA: Insufficient documentation

## 2013-04-16 DIAGNOSIS — G319 Degenerative disease of nervous system, unspecified: Secondary | ICD-10-CM | POA: Insufficient documentation

## 2013-04-16 MED ORDER — FENTANYL CITRATE 0.05 MG/ML IJ SOLN: 25.0000 ug | INTRAMUSCULAR | Status: DC | PRN

## 2013-04-16 MED ORDER — GADOBENATE DIMEGLUMINE 529 MG/ML IV SOLN
15.0000 mL | Freq: Once | INTRAVENOUS | Status: AC
  Administered 2013-04-16: 15 mL via INTRAVENOUS

## 2013-04-16 MED ORDER — MIDAZOLAM HCL 2 MG/2ML IJ SOLN: 1.0000 mg | INTRAMUSCULAR | Status: DC | PRN

## 2013-04-16 MED ORDER — MIDAZOLAM HCL 2 MG/2ML IJ SOLN
INTRAMUSCULAR | Status: AC | PRN
  Administered 2013-04-16 (×2): 2 mg via INTRAVENOUS
  Administered 2013-04-16: 1 mg via INTRAVENOUS
  Administered 2013-04-16: 2 mg via INTRAVENOUS
  Administered 2013-04-16: 1 mg via INTRAVENOUS
  Administered 2013-04-16: 2 mg via INTRAVENOUS

## 2013-04-16 MED ORDER — FENTANYL CITRATE 0.05 MG/ML IJ SOLN
INTRAMUSCULAR | Status: AC | PRN
  Administered 2013-04-16: 100 ug via INTRAVENOUS
  Administered 2013-04-16 (×2): 50 ug via INTRAVENOUS

## 2013-04-16 MED ORDER — MIDAZOLAM HCL 2 MG/2ML IJ SOLN
INTRAMUSCULAR | Status: AC
  Filled 2013-04-16: qty 10

## 2013-04-16 MED ORDER — FENTANYL CITRATE 0.05 MG/ML IJ SOLN
INTRAMUSCULAR | Status: AC
  Filled 2013-04-16: qty 4

## 2013-04-16 NOTE — ED Notes (Signed)
Transferred to radiology nurses station post MRI for recovery.  Pt denies pain and is resting comfortably.  Reported off to Cyndia Skeeters, RN

## 2013-04-16 NOTE — ED Notes (Signed)
Patient denies pain and is resting comfortably.  

## 2013-04-16 NOTE — H&P (Signed)
[name] is an 72 y.o. female.   Chief Complaint: Pt with neck and back pain x yrs Worsening pain MRI Cervical and lumbar spine ordered per Dr Laurann Montana MRI to be done with sedation MD has also ordered MRI Brain to evaluate progressive memory loss HPI: CAD; DM; HTN; HLD; COPD; SBO; GERD; glaucoma  Past Medical History  Diagnosis Date  . CAD (coronary artery disease)     s/p inferior STEMI 12/29/10 with rotablator atherectomy RCA 12/31/10 and DES x 2 RCA  . Ischemic cardiomyopathy     EF 45% by cath 12/29/10.   . Diabetes mellitus     type 2  . HTN (hypertension)   . Hyperlipidemia   . Diverticulosis   . Asthma   . COPD (chronic obstructive pulmonary disease)   . Small bowel obstruction     from a sigmoid stricture  . Nephrolithiasis   . Sleep apnea   . Overweight(278.02)   . Osteoarthritis   . GERD (gastroesophageal reflux disease)   . Anxiety   . Glaucoma   . History of colonoscopy     Past Surgical History  Procedure Laterality Date  . Bilateral tubal ligation    . Lithotripsy    . 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 Cardiac surgery      Family History  Problem Relation Age of Onset  . Breast cancer Maternal Grandmother   . Diabetes Father   . Diabetes Sister   . Heart disease Father   . Colon cancer Neg Hx    Social History:  reports that she has been smoking Cigarettes.  She has a 25 pack-year smoking history. She has never used smokeless tobacco. She reports that she does not drink alcohol or use illicit drugs.  Allergies:  Allergies  Allergen Reactions  . Lisinopril     REACTION: respiratory arrest jan 2009  . Erythromycin Other (See Comments)    Makes me feel weird   . Iodine     Pt may be allergic to iodine, does not remember the details, refuses contrast-CS  . Penicillins Itching and Other (See Comments)    Too much as a kid  . Pentazocine Lactate   . Propoxyphene Hcl   . Quinapril Hcl Itching and  Other (See Comments)    Too much   . Sulfonamide Derivatives Other (See Comments)    Childhood allergy     (Not in a hospital admission)  No results found for this or any previous visit (from the past 48 hour(s)). No results found.  Review of Systems  Constitutional: Negative for fever.  HENT: Positive for neck pain.   Eyes: Negative for blurred vision and double vision.  Respiratory: Negative for shortness of breath.   Cardiovascular: Negative for chest pain.  Gastrointestinal: Negative for nausea, vomiting and abdominal pain.  Musculoskeletal: Positive for back pain.  Neurological: Negative for headaches.  Psychiatric/Behavioral: Positive for memory loss.    Blood pressure 105/52, pulse 74, resp. rate 15, SpO2 97.00%. Physical Exam  Constitutional: She is oriented to person, place, and time.  Cardiovascular: Normal rate and regular rhythm.   No murmur heard. Respiratory: Effort normal. She has wheezes.  GI: Soft. Bowel sounds are normal. There is no tenderness.  Musculoskeletal: Normal range of motion.  Neurological: She is alert and oriented to person, place, and time. No cranial nerve deficit. Coordination normal.  Psychiatric: She has a normal mood and affect. Her behavior is normal. Judgment and  thought content normal.     Assessment/Plan Chronic back and neck pain - worsening Progressive memory loss Scheduled for MRI cervical/lumbar and brain with sedation Pt aware of procedure benefits and risks and agreeable to proceed Consent signed and in chart  Antwion Carpenter A 04/16/2013, 8:49 AM

## 2013-04-23 ENCOUNTER — Encounter: Payer: Self-pay | Admitting: Neurology

## 2013-04-23 ENCOUNTER — Ambulatory Visit (INDEPENDENT_AMBULATORY_CARE_PROVIDER_SITE_OTHER): Payer: Medicare Other | Admitting: Neurology

## 2013-04-23 VITALS — BP 102/62 | HR 89 | Temp 98.1°F | Ht 65.0 in | Wt 144.0 lb

## 2013-04-23 DIAGNOSIS — R413 Other amnesia: Secondary | ICD-10-CM

## 2013-04-23 DIAGNOSIS — D518 Other vitamin B12 deficiency anemias: Secondary | ICD-10-CM
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
## 2013-04-29 ENCOUNTER — Other Ambulatory Visit: Payer: Self-pay | Admitting: *Deleted

## 2013-04-29 MED ORDER — PRASUGREL HCL 10 MG PO TABS: 10.0000 mg | ORAL_TABLET | Freq: Every day | ORAL | Status: DC

## 2013-05-06 ENCOUNTER — Encounter (HOSPITAL_COMMUNITY): Payer: Self-pay

## 2013-05-06 ENCOUNTER — Other Ambulatory Visit: Payer: Self-pay

## 2013-05-06 ENCOUNTER — Emergency Department (HOSPITAL_COMMUNITY): Payer: Medicare Other

## 2013-05-06 ENCOUNTER — Emergency Department (HOSPITAL_COMMUNITY)
Admission: EM | Admit: 2013-05-06 | Discharge: 2013-05-06 | Disposition: A | Discharge status: Home / Self Care | Payer: Medicare Other | Attending: Emergency Medicine | Admitting: Emergency Medicine

## 2013-05-06 DIAGNOSIS — F411 Generalized anxiety disorder: Secondary | ICD-10-CM | POA: Insufficient documentation

## 2013-05-06 DIAGNOSIS — E119 Type 2 diabetes mellitus without complications: Secondary | ICD-10-CM | POA: Insufficient documentation

## 2013-05-06 DIAGNOSIS — Z9889 Other specified postprocedural states: Secondary | ICD-10-CM | POA: Insufficient documentation

## 2013-05-06 DIAGNOSIS — I2510 Atherosclerotic heart disease of native coronary artery without angina pectoris: Secondary | ICD-10-CM | POA: Insufficient documentation

## 2013-05-06 DIAGNOSIS — Z880 Allergy status to penicillin: Secondary | ICD-10-CM | POA: Insufficient documentation

## 2013-05-06 DIAGNOSIS — E785 Hyperlipidemia, unspecified: Secondary | ICD-10-CM | POA: Insufficient documentation

## 2013-05-06 DIAGNOSIS — E663 Overweight: Secondary | ICD-10-CM | POA: Insufficient documentation

## 2013-05-06 DIAGNOSIS — K219 Gastro-esophageal reflux disease without esophagitis: Secondary | ICD-10-CM | POA: Insufficient documentation

## 2013-05-06 DIAGNOSIS — IMO0002 Reserved for concepts with insufficient information to code with codable children: Secondary | ICD-10-CM | POA: Insufficient documentation

## 2013-05-06 DIAGNOSIS — G4730 Sleep apnea, unspecified: Secondary | ICD-10-CM | POA: Insufficient documentation

## 2013-05-06 DIAGNOSIS — Z79899 Other long term (current) drug therapy: Secondary | ICD-10-CM | POA: Insufficient documentation

## 2013-05-06 DIAGNOSIS — Z8669 Personal history of other diseases of the nervous system and sense organs: Secondary | ICD-10-CM | POA: Insufficient documentation

## 2013-05-06 DIAGNOSIS — M1990 Unspecified osteoarthritis, unspecified site: Secondary | ICD-10-CM | POA: Insufficient documentation

## 2013-05-06 DIAGNOSIS — F17200 Nicotine dependence, unspecified, uncomplicated: Secondary | ICD-10-CM | POA: Insufficient documentation

## 2013-05-06 DIAGNOSIS — Z8679 Personal history of other diseases of the circulatory system: Secondary | ICD-10-CM | POA: Insufficient documentation

## 2013-05-06 DIAGNOSIS — H409 Unspecified glaucoma: Secondary | ICD-10-CM | POA: Insufficient documentation

## 2013-05-06 DIAGNOSIS — Z7902 Long term (current) use of antithrombotics/antiplatelets: Secondary | ICD-10-CM | POA: Insufficient documentation

## 2013-05-06 DIAGNOSIS — Z8719 Personal history of other diseases of the digestive system: Secondary | ICD-10-CM | POA: Insufficient documentation

## 2013-05-06 DIAGNOSIS — Z794 Long term (current) use of insulin: Secondary | ICD-10-CM | POA: Insufficient documentation

## 2013-05-06 DIAGNOSIS — Z7982 Long term (current) use of aspirin: Secondary | ICD-10-CM | POA: Insufficient documentation

## 2013-05-06 DIAGNOSIS — R05 Cough: Secondary | ICD-10-CM | POA: Insufficient documentation

## 2013-05-06 DIAGNOSIS — Z87442 Personal history of urinary calculi: Secondary | ICD-10-CM | POA: Insufficient documentation

## 2013-05-06 DIAGNOSIS — I10 Essential (primary) hypertension: Secondary | ICD-10-CM | POA: Insufficient documentation

## 2013-05-06 DIAGNOSIS — J441 Chronic obstructive pulmonary disease with (acute) exacerbation: Secondary | ICD-10-CM | POA: Insufficient documentation

## 2013-05-06 DIAGNOSIS — R059 Cough, unspecified: Secondary | ICD-10-CM | POA: Insufficient documentation

## 2013-05-06 LAB — CBC WITH DIFFERENTIAL/PLATELET
Basophils Relative: 0 % (ref 0–1)
Eosinophils Absolute: 0 10*3/uL (ref 0.0–0.7)
HCT: 45.4 % (ref 36.0–46.0)
Hemoglobin: 15.3 g/dL — ABNORMAL HIGH (ref 12.0–15.0)
MCH: 32.6 pg (ref 26.0–34.0)
MCHC: 33.7 g/dL (ref 30.0–36.0)
Monocytes Absolute: 0.2 10*3/uL (ref 0.1–1.0)
Monocytes Relative: 2 % — ABNORMAL LOW (ref 3–12)
Neutrophils Relative %: 80 % — ABNORMAL HIGH (ref 43–77)
RDW: 12.7 % (ref 11.5–15.5)

## 2013-05-06 LAB — URINALYSIS, ROUTINE W REFLEX MICROSCOPIC
Glucose, UA: NEGATIVE mg/dL
Leukocytes, UA: NEGATIVE
Protein, ur: 30 mg/dL — AB
Specific Gravity, Urine: 1.032 — ABNORMAL HIGH (ref 1.005–1.030)
pH: 5 (ref 5.0–8.0)

## 2013-05-06 LAB — URINE MICROSCOPIC-ADD ON

## 2013-05-06 LAB — COMPREHENSIVE METABOLIC PANEL
Albumin: 4 g/dL (ref 3.5–5.2)
BUN: 21 mg/dL (ref 6–23)
Calcium: 10 mg/dL (ref 8.4–10.5)
Creatinine, Ser: 0.82 mg/dL (ref 0.50–1.10)
Total Protein: 7.7 g/dL (ref 6.0–8.3)

## 2013-05-06 MED ORDER — ALBUTEROL (5 MG/ML) CONTINUOUS INHALATION SOLN
10.0000 mg/h | INHALATION_SOLUTION | Freq: Once | RESPIRATORY_TRACT | Status: AC
  Administered 2013-05-06: 10 mg/h via RESPIRATORY_TRACT
  Filled 2013-05-06: qty 20

## 2013-05-06 MED ORDER — PREDNISONE 10 MG PO TABS: 20.0000 mg | ORAL_TABLET | Freq: Every day | ORAL | Status: DC

## 2013-05-06 MED ORDER — SODIUM CHLORIDE 0.9 % IV SOLN
INTRAVENOUS | Status: DC
  Administered 2013-05-06: 16:00:00 via INTRAVENOUS

## 2013-05-06 NOTE — ED Provider Notes (Signed)
History     CSN: [id number]  Arrival date & time 05/06/13  1452   First MD Initiated Contact with Patient 05/06/13 1507      Chief Complaint  Patient presents with  . Shortness of Breath    (Consider location/radiation/quality/duration/timing/severity/associated sxs/prior treatment) Patient is a 72 y.o. female presenting with shortness of breath. The history is provided by the patient.  Shortness of Breath  here complaining of shortness of breath x4 days. Similar symptoms in the past associated with her COPD exacerbations. She has had a nonproductive cough without fever. No vomiting or diarrhea. Does have a home nebulizer which is been using without relief. States that when she gets this way usually receive a few nebulizers here in the home. Denies any urinary symptoms. Denies anginal type chest pain. No CHF symptoms.  Past Medical History  Diagnosis Date  . CAD (coronary artery disease)     s/p inferior STEMI 12/29/10 with rotablator atherectomy RCA 12/31/10 and DES x 2 RCA  . Ischemic cardiomyopathy     EF 45% by cath 12/29/10.   . Diabetes mellitus     type 2  . HTN (hypertension)   . Hyperlipidemia   . Diverticulosis   . Asthma   . COPD (chronic obstructive pulmonary disease)   . Small bowel obstruction     from a sigmoid stricture  . Nephrolithiasis   . Sleep apnea   . Overweight(278.02)   . Osteoarthritis   . GERD (gastroesophageal reflux disease)   . Anxiety   . Glaucoma   . History of colonoscopy   . Memory deficit 04/23/2013    Past Surgical History  Procedure Laterality Date  . Bilateral tubal ligation    . Lithotripsy    . 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 Cardiac surgery      Family History  Problem Relation Age of Onset  . Breast cancer Maternal Grandmother   . Diabetes Father   . Heart disease Father   . Diabetes Sister   . Colon cancer Neg Hx     History  Substance Use Topics  . Smoking status: Current  Every Day Smoker -- 0.50 packs/day for 25 years    Types: Cigarettes    Last Attempt to Quit: 10/20/2004  . Smokeless tobacco: Never Used     Comment: over a ppd for 40+ years; quit 10/2004  . Alcohol Use: 0.5 oz/week    1 drink(s) per week     Comment: rare     OB History   Grav Para Term Preterm Abortions TAB SAB Ect Mult Living                  Review of Systems  Respiratory: Positive for shortness of breath.   All other systems reviewed and are negative.    Allergies  Lisinopril; Erythromycin; Iodine; Penicillins; Pentazocine lactate; Propoxyphene hcl; Quinapril hcl; and Sulfonamide derivatives  Home Medications   Current Outpatient Rx  Name  Route  Sig  Dispense  Refill  . albuterol (PROVENTIL) (2.5 MG/3ML) 0.083% nebulizer solution   Nebulization   Take 2.5 mg by nebulization every 6 (six) hours as needed for shortness of breath.          Marland Kitchen aspirin EC 81 MG tablet   Oral   Take 81 mg by mouth daily.         . cyclobenzaprine (FLEXERIL) 10 MG tablet   Oral   Take 5-10 mg by mouth  3 (three) times daily as needed for muscle spasms.          Marland Kitchen donepezil (ARICEPT) 5 MG tablet   Oral   Take 5 mg by mouth at bedtime as needed.         . ezetimibe (ZETIA) 10 MG tablet   Oral   Take 10 mg by mouth daily.           . fenofibrate 160 MG tablet   Oral   Take 160 mg by mouth daily.         . fish oil-omega-3 fatty acids 1000 MG capsule   Oral   Take 2 g by mouth 3 (three) times daily.         . Fluticasone-Salmeterol (ADVAIR DISKUS) 250-50 MCG/DOSE AEPB   Inhalation   Inhale 1 puff into the lungs every 12 (twelve) hours.         . hydrOXYzine (ATARAX/VISTARIL) 25 MG tablet   Oral   Take 25 mg by mouth 3 (three) times daily as needed for itching (1/2-1 q 6 hrs prn for itching).         Marland Kitchen ibuprofen (ADVIL,MOTRIN) 200 MG tablet   Oral   Take 200 mg by mouth every 8 (eight) hours as needed for pain.          Marland Kitchen insulin aspart (NOVOLOG) 100  unit/ml SOLN   Subcutaneous   Inject 5 Units into the skin 3 (three) times daily with meals.   1 pen   3   . insulin glargine (LANTUS SOLOSTAR) 100 UNIT/ML injection   Subcutaneous   Inject 10 Units into the skin at bedtime.          . JENTADUETO 2.03-999 MG TABS   Oral   Take 1 tablet by mouth 2 (two) times daily.          Marland Kitchen LORazepam (ATIVAN) 0.5 MG tablet   Oral   Take 0.5 mg by mouth 2 (two) times daily as needed for anxiety.         . metoprolol tartrate (LOPRESSOR) 25 MG tablet   Oral   Take 25 mg by mouth 2 (two) times daily.           . nitroGLYCERIN (NITROSTAT) 0.4 MG SL tablet   Sublingual   Place 0.4 mg under the tongue every 5 (five) minutes as needed. For chest pain.         Marland Kitchen oxyCODONE-acetaminophen (PERCOCET/ROXICET) 5-325 MG per tablet   Oral   Take 1 tablet by mouth every 6 (six) hours as needed for pain.          . polyethylene glycol powder (MIRALAX) powder   Oral   Take 17 g by mouth daily.           . prasugrel (EFFIENT) 10 MG TABS   Oral   Take 1 tablet (10 mg total) by mouth daily.   30 tablet   0   . pregabalin (LYRICA) 75 MG capsule   Oral   Take 75 mg by mouth 2 (two) times daily.           . rosuvastatin (CRESTOR) 20 MG tablet   Oral   Take 40 mg by mouth daily.         . timolol (BETIMOL) 0.5 % ophthalmic solution   Right Eye   Place 1 drop into the right eye 2 (two) times daily.           Marland Kitchen tolterodine (  DETROL LA) 4 MG 24 hr capsule   Oral   Take 4 mg by mouth daily.             BP 134/51  Pulse 87  Temp(Src) 97.6 F (36.4 C) (Oral)  Resp 22  SpO2 96%  Physical Exam  Nursing note and vitals reviewed. Constitutional: She is oriented to person, place, and time. She appears well-developed and well-nourished.  Non-toxic appearance. No distress.  HENT:  Head: Normocephalic and atraumatic.  Eyes: Conjunctivae, EOM and lids are normal. Pupils are equal, round, and reactive to light.  Neck: Normal range  of motion. Neck supple. No tracheal deviation present. No mass present.  Cardiovascular: Normal rate, regular rhythm and normal heart sounds.  Exam reveals no gallop.   No murmur heard. Pulmonary/Chest: Effort normal. No stridor. No respiratory distress. She has decreased breath sounds. She has wheezes. She has no rhonchi. She has no rales.  Abdominal: Soft. Normal appearance and bowel sounds are normal. She exhibits no distension. There is no tenderness. There is no rebound and no CVA tenderness.  Musculoskeletal: Normal range of motion. She exhibits no edema and no tenderness.  Neurological: She is alert and oriented to person, place, and time. She has normal strength. No cranial nerve deficit or sensory deficit. GCS eye subscore is 4. GCS verbal subscore is 5. GCS motor subscore is 6.  Skin: Skin is warm and dry. No abrasion and no rash noted.  Psychiatric: She has a normal mood and affect. Her speech is normal and behavior is normal.    ED Course  Procedures (including critical care time)  Labs Reviewed - No data to display No results found.   No diagnosis found.    MDM  6:33 PM Pt with improved breathing, will ambulate  7:11 PM Pt received Solu-Medrol prior to arrival by EMS. Was given albuterol 10 mg over one hour here. She was ambulated in the department and so stable for discharge. Pulse ox remained stable.      Toy Baker, MD 05/06/13 [phone]

## 2013-05-06 NOTE — ED Notes (Signed)
[id number]<AV> Expected date:05/06/13<BR> Expected time: 2:46 PM<BR> Means of arrival:<BR> Comments:<BR> 72 year old SOB

## 2013-05-06 NOTE — ED Notes (Signed)
Sats 92% while walking in the hallway on room air.

## 2013-05-06 NOTE — ED Notes (Signed)
Per EMS- Patient c/o SOB with cough x 4 days patient used Home Neb treatment at home with no relief. Patient then called EMS.Patient had sligh expiratory wheezing upon arrival. Patient given albuterol 5 mg and Atrovent 5 via neb and Solumedrol 125mg  IV. Lungs clear afataer treatment. EKG completed-SR-90.

## 2013-05-19 ENCOUNTER — Ambulatory Visit: Payer: Medicare Other | Admitting: *Deleted

## 2013-05-20 ENCOUNTER — Encounter (HOSPITAL_COMMUNITY): Payer: Self-pay | Admitting: Emergency Medicine

## 2013-05-20 ENCOUNTER — Emergency Department (HOSPITAL_COMMUNITY): Payer: Medicare Other

## 2013-05-20 ENCOUNTER — Inpatient Hospital Stay (HOSPITAL_COMMUNITY)
Admission: EM | Admit: 2013-05-20 | Discharge: 2013-05-22 | DRG: 193 | Disposition: A | Discharge status: Home / Self Care | Payer: Medicare Other | Attending: Internal Medicine | Admitting: Internal Medicine

## 2013-05-20 DIAGNOSIS — E119 Type 2 diabetes mellitus without complications: Secondary | ICD-10-CM | POA: Diagnosis present

## 2013-05-20 DIAGNOSIS — H409 Unspecified glaucoma: Secondary | ICD-10-CM | POA: Diagnosis present

## 2013-05-20 DIAGNOSIS — K219 Gastro-esophageal reflux disease without esophagitis: Secondary | ICD-10-CM | POA: Diagnosis present

## 2013-05-20 DIAGNOSIS — Z794 Long term (current) use of insulin: Secondary | ICD-10-CM

## 2013-05-20 DIAGNOSIS — J189 Pneumonia, unspecified organism: Principal | ICD-10-CM | POA: Diagnosis present

## 2013-05-20 DIAGNOSIS — R0902 Hypoxemia: Secondary | ICD-10-CM

## 2013-05-20 DIAGNOSIS — G4730 Sleep apnea, unspecified: Secondary | ICD-10-CM | POA: Diagnosis present

## 2013-05-20 DIAGNOSIS — Z880 Allergy status to penicillin: Secondary | ICD-10-CM

## 2013-05-20 DIAGNOSIS — E785 Hyperlipidemia, unspecified: Secondary | ICD-10-CM | POA: Diagnosis present

## 2013-05-20 DIAGNOSIS — F411 Generalized anxiety disorder: Secondary | ICD-10-CM | POA: Diagnosis present

## 2013-05-20 DIAGNOSIS — I2510 Atherosclerotic heart disease of native coronary artery without angina pectoris: Secondary | ICD-10-CM | POA: Diagnosis present

## 2013-05-20 DIAGNOSIS — J441 Chronic obstructive pulmonary disease with (acute) exacerbation: Secondary | ICD-10-CM | POA: Diagnosis present

## 2013-05-20 DIAGNOSIS — J209 Acute bronchitis, unspecified: Secondary | ICD-10-CM | POA: Diagnosis present

## 2013-05-20 DIAGNOSIS — J9600 Acute respiratory failure, unspecified whether with hypoxia or hypercapnia: Secondary | ICD-10-CM | POA: Diagnosis present

## 2013-05-20 DIAGNOSIS — E1142 Type 2 diabetes mellitus with diabetic polyneuropathy: Secondary | ICD-10-CM | POA: Diagnosis present

## 2013-05-20 DIAGNOSIS — E1149 Type 2 diabetes mellitus with other diabetic neurological complication: Secondary | ICD-10-CM | POA: Diagnosis present

## 2013-05-20 DIAGNOSIS — R109 Unspecified abdominal pain: Secondary | ICD-10-CM | POA: Diagnosis present

## 2013-05-20 DIAGNOSIS — F17200 Nicotine dependence, unspecified, uncomplicated: Secondary | ICD-10-CM | POA: Diagnosis present

## 2013-05-20 DIAGNOSIS — I25810 Atherosclerosis of coronary artery bypass graft(s) without angina pectoris: Secondary | ICD-10-CM

## 2013-05-20 DIAGNOSIS — I10 Essential (primary) hypertension: Secondary | ICD-10-CM | POA: Diagnosis present

## 2013-05-20 DIAGNOSIS — Z79899 Other long term (current) drug therapy: Secondary | ICD-10-CM

## 2013-05-20 DIAGNOSIS — I252 Old myocardial infarction: Secondary | ICD-10-CM

## 2013-05-20 DIAGNOSIS — E876 Hypokalemia: Secondary | ICD-10-CM | POA: Diagnosis present

## 2013-05-20 DIAGNOSIS — Z8719 Personal history of other diseases of the digestive system: Secondary | ICD-10-CM

## 2013-05-20 DIAGNOSIS — I2589 Other forms of chronic ischemic heart disease: Secondary | ICD-10-CM | POA: Diagnosis present

## 2013-05-20 DIAGNOSIS — J440 Chronic obstructive pulmonary disease with acute lower respiratory infection: Secondary | ICD-10-CM | POA: Diagnosis present

## 2013-05-20 LAB — POCT I-STAT, CHEM 8
Creatinine, Ser: 1 mg/dL (ref 0.50–1.10)
Hemoglobin: 13.9 g/dL (ref 12.0–15.0)
Sodium: 139 mEq/L (ref 135–145)
TCO2: 26 mmol/L (ref 0–100)

## 2013-05-20 LAB — GLUCOSE, CAPILLARY

## 2013-05-20 LAB — CBC
HCT: 41.3 % (ref 36.0–46.0)
MCHC: 33.2 g/dL (ref 30.0–36.0)
RDW: 12.4 % (ref 11.5–15.5)

## 2013-05-20 LAB — POCT I-STAT TROPONIN I

## 2013-05-20 MED ORDER — ONDANSETRON HCL 4 MG PO TABS: 4.0000 mg | ORAL_TABLET | Freq: Four times a day (QID) | ORAL | Status: DC | PRN

## 2013-05-20 MED ORDER — SODIUM CHLORIDE 0.9 % IV SOLN
Freq: Once | INTRAVENOUS | Status: AC
  Administered 2013-05-20: 07:00:00 via INTRAVENOUS

## 2013-05-20 MED ORDER — FESOTERODINE FUMARATE ER 4 MG PO TB24
4.0000 mg | ORAL_TABLET | Freq: Every day | ORAL | Status: DC
  Administered 2013-05-20 – 2013-05-22 (×3): 4 mg via ORAL
  Filled 2013-05-20 (×3): qty 1

## 2013-05-20 MED ORDER — TIMOLOL HEMIHYDRATE 0.5 % OP SOLN
1.0000 [drp] | Freq: Two times a day (BID) | OPHTHALMIC | Status: DC
  Filled 2013-05-20 (×18): qty 0.1

## 2013-05-20 MED ORDER — FENOFIBRATE 160 MG PO TABS
160.0000 mg | ORAL_TABLET | Freq: Every day | ORAL | Status: DC
  Administered 2013-05-20 – 2013-05-22 (×3): 160 mg via ORAL
  Filled 2013-05-20 (×3): qty 1

## 2013-05-20 MED ORDER — MOMETASONE FURO-FORMOTEROL FUM 100-5 MCG/ACT IN AERO
2.0000 | INHALATION_SPRAY | Freq: Two times a day (BID) | RESPIRATORY_TRACT | Status: DC
  Administered 2013-05-20 – 2013-05-22 (×4): 2 via RESPIRATORY_TRACT
  Filled 2013-05-20: qty 8.8

## 2013-05-20 MED ORDER — LINAGLIPTIN 5 MG PO TABS
2.5000 mg | ORAL_TABLET | Freq: Two times a day (BID) | ORAL | Status: DC
  Administered 2013-05-20 – 2013-05-22 (×4): 2.5 mg via ORAL
  Filled 2013-05-20 (×7): qty 1

## 2013-05-20 MED ORDER — POTASSIUM CHLORIDE CRYS ER 20 MEQ PO TBCR
20.0000 meq | EXTENDED_RELEASE_TABLET | Freq: Once | ORAL | Status: AC
  Administered 2013-05-20: 20 meq via ORAL
  Filled 2013-05-20: qty 1

## 2013-05-20 MED ORDER — METFORMIN HCL 500 MG PO TABS
1000.0000 mg | ORAL_TABLET | Freq: Two times a day (BID) | ORAL | Status: DC
  Administered 2013-05-20 – 2013-05-22 (×4): 1000 mg via ORAL
  Filled 2013-05-20 (×6): qty 2

## 2013-05-20 MED ORDER — ALBUTEROL SULFATE (5 MG/ML) 0.5% IN NEBU: 2.5000 mg | INHALATION_SOLUTION | RESPIRATORY_TRACT | Status: DC | PRN

## 2013-05-20 MED ORDER — INSULIN GLARGINE 100 UNIT/ML ~~LOC~~ SOLN
10.0000 [IU] | Freq: Every day | SUBCUTANEOUS | Status: DC
  Administered 2013-05-20: 10 [IU] via SUBCUTANEOUS
  Filled 2013-05-20 (×2): qty 0.1

## 2013-05-20 MED ORDER — PREGABALIN 75 MG PO CAPS
75.0000 mg | ORAL_CAPSULE | Freq: Two times a day (BID) | ORAL | Status: DC
  Administered 2013-05-20 – 2013-05-22 (×5): 75 mg via ORAL
  Filled 2013-05-20 (×5): qty 1

## 2013-05-20 MED ORDER — NITROGLYCERIN 0.4 MG SL SUBL: 0.4000 mg | SUBLINGUAL_TABLET | SUBLINGUAL | Status: DC | PRN

## 2013-05-20 MED ORDER — INSULIN ASPART 100 UNIT/ML ~~LOC~~ SOLN
5.0000 [IU] | Freq: Once | SUBCUTANEOUS | Status: AC
  Administered 2013-05-20: 5 [IU] via SUBCUTANEOUS

## 2013-05-20 MED ORDER — ROSUVASTATIN CALCIUM 40 MG PO TABS
40.0000 mg | ORAL_TABLET | Freq: Every day | ORAL | Status: DC
  Administered 2013-05-20 – 2013-05-21 (×2): 40 mg via ORAL
  Filled 2013-05-20 (×3): qty 1

## 2013-05-20 MED ORDER — CYCLOBENZAPRINE HCL 5 MG PO TABS
5.0000 mg | ORAL_TABLET | Freq: Three times a day (TID) | ORAL | Status: DC | PRN
  Filled 2013-05-20: qty 2

## 2013-05-20 MED ORDER — LORAZEPAM 0.5 MG PO TABS: 0.5000 mg | ORAL_TABLET | Freq: Two times a day (BID) | ORAL | Status: DC | PRN

## 2013-05-20 MED ORDER — INSULIN ASPART 100 UNIT/ML ~~LOC~~ SOLN
0.0000 [IU] | Freq: Three times a day (TID) | SUBCUTANEOUS | Status: DC
  Administered 2013-05-20 – 2013-05-21 (×4): 9 [IU] via SUBCUTANEOUS
  Administered 2013-05-21: 7 [IU] via SUBCUTANEOUS

## 2013-05-20 MED ORDER — PRASUGREL HCL 10 MG PO TABS
10.0000 mg | ORAL_TABLET | Freq: Every day | ORAL | Status: DC
  Administered 2013-05-20 – 2013-05-22 (×3): 10 mg via ORAL
  Filled 2013-05-20 (×3): qty 1

## 2013-05-20 MED ORDER — ATORVASTATIN CALCIUM 20 MG PO TABS: 20.0000 mg | ORAL_TABLET | Freq: Every day | ORAL | Status: DC

## 2013-05-20 MED ORDER — GUAIFENESIN 100 MG/5ML PO SYRP
200.0000 mg | ORAL_SOLUTION | ORAL | Status: DC | PRN
  Filled 2013-05-20 (×2): qty 10

## 2013-05-20 MED ORDER — METOPROLOL TARTRATE 25 MG PO TABS
25.0000 mg | ORAL_TABLET | Freq: Two times a day (BID) | ORAL | Status: DC
  Administered 2013-05-20 – 2013-05-22 (×5): 25 mg via ORAL
  Filled 2013-05-20 (×6): qty 1

## 2013-05-20 MED ORDER — ONDANSETRON HCL 4 MG/2ML IJ SOLN: 4.0000 mg | Freq: Four times a day (QID) | INTRAMUSCULAR | Status: DC | PRN

## 2013-05-20 MED ORDER — HYDROXYZINE HCL 25 MG PO TABS
12.5000 mg | ORAL_TABLET | Freq: Four times a day (QID) | ORAL | Status: DC | PRN
  Filled 2013-05-20: qty 1

## 2013-05-20 MED ORDER — TIMOLOL MALEATE 0.5 % OP SOLN
1.0000 [drp] | Freq: Two times a day (BID) | OPHTHALMIC | Status: DC
  Administered 2013-05-20 – 2013-05-22 (×5): 1 [drp] via OPHTHALMIC
  Filled 2013-05-20: qty 5

## 2013-05-20 MED ORDER — LINAGLIPTIN-METFORMIN HCL 2.5-1000 MG PO TABS: 1.0000 | ORAL_TABLET | Freq: Two times a day (BID) | ORAL | Status: DC

## 2013-05-20 MED ORDER — ASPIRIN EC 81 MG PO TBEC
81.0000 mg | DELAYED_RELEASE_TABLET | Freq: Every day | ORAL | Status: DC
  Administered 2013-05-20 – 2013-05-22 (×3): 81 mg via ORAL
  Filled 2013-05-20 (×3): qty 1

## 2013-05-20 MED ORDER — METHYLPREDNISOLONE SODIUM SUCC 125 MG IJ SOLR
60.0000 mg | Freq: Two times a day (BID) | INTRAMUSCULAR | Status: AC
  Administered 2013-05-20 – 2013-05-21 (×3): 60 mg via INTRAVENOUS
  Filled 2013-05-20 (×3): qty 0.96

## 2013-05-20 MED ORDER — VANCOMYCIN HCL IN DEXTROSE 1-5 GM/200ML-% IV SOLN
1000.0000 mg | Freq: Once | INTRAVENOUS | Status: DC
  Filled 2013-05-20: qty 200

## 2013-05-20 MED ORDER — LEVOFLOXACIN IN D5W 750 MG/150ML IV SOLN
750.0000 mg | Freq: Once | INTRAVENOUS | Status: AC
  Administered 2013-05-20: 750 mg via INTRAVENOUS
  Filled 2013-05-20: qty 150

## 2013-05-20 MED ORDER — VANCOMYCIN HCL IN DEXTROSE 1-5 GM/200ML-% IV SOLN
1000.0000 mg | Freq: Once | INTRAVENOUS | Status: AC
  Administered 2013-05-20: 1000 mg via INTRAVENOUS
  Filled 2013-05-20: qty 200

## 2013-05-20 MED ORDER — ENOXAPARIN SODIUM 30 MG/0.3ML ~~LOC~~ SOLN: 30.0000 mg | SUBCUTANEOUS | Status: DC

## 2013-05-20 MED ORDER — VANCOMYCIN HCL 500 MG IV SOLR
500.0000 mg | Freq: Two times a day (BID) | INTRAVENOUS | Status: DC
  Administered 2013-05-20 – 2013-05-22 (×4): 500 mg via INTRAVENOUS
  Filled 2013-05-20 (×5): qty 500

## 2013-05-20 MED ORDER — POLYETHYLENE GLYCOL 3350 17 GM/SCOOP PO POWD
17.0000 g | Freq: Every day | ORAL | Status: DC
  Administered 2013-05-21: 17 g via ORAL
  Filled 2013-05-20: qty 255

## 2013-05-20 MED ORDER — GUAIFENESIN ER 600 MG PO TB12
600.0000 mg | ORAL_TABLET | Freq: Two times a day (BID) | ORAL | Status: DC
  Administered 2013-05-20 – 2013-05-22 (×5): 600 mg via ORAL
  Filled 2013-05-20 (×6): qty 1

## 2013-05-20 MED ORDER — PIPERACILLIN-TAZOBACTAM 3.375 G IVPB
3.3750 g | Freq: Three times a day (TID) | INTRAVENOUS | Status: DC
  Administered 2013-05-20 – 2013-05-22 (×6): 3.375 g via INTRAVENOUS
  Filled 2013-05-20 (×8): qty 50

## 2013-05-20 MED ORDER — SODIUM CHLORIDE 0.9 % IJ SOLN: 3.0000 mL | Freq: Two times a day (BID) | INTRAMUSCULAR | Status: DC

## 2013-05-20 MED ORDER — EZETIMIBE 10 MG PO TABS
10.0000 mg | ORAL_TABLET | Freq: Every day | ORAL | Status: DC
  Administered 2013-05-20 – 2013-05-22 (×3): 10 mg via ORAL
  Filled 2013-05-20 (×3): qty 1

## 2013-05-20 MED ORDER — ALBUTEROL SULFATE (5 MG/ML) 0.5% IN NEBU
2.5000 mg | INHALATION_SOLUTION | Freq: Four times a day (QID) | RESPIRATORY_TRACT | Status: DC
  Administered 2013-05-20 – 2013-05-22 (×8): 2.5 mg via RESPIRATORY_TRACT
  Filled 2013-05-20 (×8): qty 0.5

## 2013-05-20 MED ORDER — ENOXAPARIN SODIUM 40 MG/0.4ML ~~LOC~~ SOLN
40.0000 mg | SUBCUTANEOUS | Status: DC
  Administered 2013-05-20 – 2013-05-21 (×2): 40 mg via SUBCUTANEOUS
  Filled 2013-05-20 (×3): qty 0.4

## 2013-05-20 MED ORDER — DONEPEZIL HCL 5 MG PO TABS
5.0000 mg | ORAL_TABLET | Freq: Every day | ORAL | Status: DC
  Administered 2013-05-20 – 2013-05-21 (×2): 5 mg via ORAL
  Filled 2013-05-20 (×4): qty 1

## 2013-05-20 MED ORDER — ALBUTEROL SULFATE (5 MG/ML) 0.5% IN NEBU
5.0000 mg | INHALATION_SOLUTION | Freq: Once | RESPIRATORY_TRACT | Status: AC
  Administered 2013-05-20: 5 mg via RESPIRATORY_TRACT
  Filled 2013-05-20: qty 1

## 2013-05-20 MED ORDER — LEVOFLOXACIN IN D5W 750 MG/150ML IV SOLN: 750.0000 mg | INTRAVENOUS | Status: DC

## 2013-05-20 MED ORDER — METHYLPREDNISOLONE SODIUM SUCC 125 MG IJ SOLR: 125.0000 mg | Freq: Once | INTRAMUSCULAR | Status: DC

## 2013-05-20 MED ORDER — OXYCODONE-ACETAMINOPHEN 5-325 MG PO TABS
1.0000 | ORAL_TABLET | Freq: Four times a day (QID) | ORAL | Status: DC | PRN
  Administered 2013-05-20: 1 via ORAL
  Filled 2013-05-20: qty 1

## 2013-05-20 MED ORDER — DIPHENHYDRAMINE HCL 12.5 MG/5ML PO ELIX: 12.5000 mg | ORAL_SOLUTION | Freq: Four times a day (QID) | ORAL | Status: DC | PRN

## 2013-05-20 NOTE — ED Notes (Signed)
Brought in by EMS from home with c/o shortness of breath.  Per EMS, pt reported that pt woke up with wheezing and shortness of breath-- took inhalers but it did not help.  Pt was given a total of Albuterol 10 mg  And Atrovent 0.5 mg neb tx en route to ED; pt was also given Solu-Medrol 125 mg IV en route to ED; pt's O2 sat on EMS' arrival was 87-90% on room air--- was given O2 on 2LPM via [place] en route to ED.

## 2013-05-20 NOTE — ED Provider Notes (Signed)
History    CSN: [id number] Arrival date & time 05/20/13  5284  First MD Initiated Contact with Patient 05/20/13 [phone]     Chief Complaint  Patient presents with  . Shortness of Breath   (Consider location/radiation/quality/duration/timing/severity/associated sxs/prior Treatment) HPI History provided by patient - brought in by EMS for worsening dyspnea, cough with subjective fevers. Feeling poorly her last few days and worse last night.  Per EMS room air pulse ox 87 and 90% improved with oxygen. She received Solu-Medrol and albuterol breathing treatment in route. Arrival to the emergency department she denies any chest pain, has some improvement with medications, does have a history of COPD and feels like she has a cold on top of this COPD exacerbation. No leg pain or leg swelling. No abdominal pain or vomiting. Symptoms moderate to severe, not improved with breathing treatments at home.  Past Medical History  Diagnosis Date  . CAD (coronary artery disease)     s/p inferior STEMI 12/29/10 with rotablator atherectomy RCA 12/31/10 and DES x 2 RCA  . Ischemic cardiomyopathy     EF 45% by cath 12/29/10.   . Diabetes mellitus     type 2  . HTN (hypertension)   . Hyperlipidemia   . Diverticulosis   . Asthma   . COPD (chronic obstructive pulmonary disease)   . Small bowel obstruction     from a sigmoid stricture  . Nephrolithiasis   . Sleep apnea   . Overweight(278.02)   . Osteoarthritis   . GERD (gastroesophageal reflux disease)   . Anxiety   . Glaucoma   . History of colonoscopy   . Memory deficit 04/23/2013   Past Surgical History  Procedure Laterality Date  . Bilateral tubal ligation    . Lithotripsy    . 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 Cardiac surgery     Family History  Problem Relation Age of Onset  . Breast cancer Maternal Grandmother   . Diabetes Father   . Heart disease Father   . Diabetes Sister   . Colon cancer Neg Hx     History  Substance Use Topics  . Smoking status: Current Every Day Smoker -- 0.50 packs/day for 25 years    Types: Cigarettes    Last Attempt to Quit: 10/20/2004  . Smokeless tobacco: Never Used     Comment: over a ppd for 40+ years; quit 10/2004  . Alcohol Use: 0.5 oz/week    1 drink(s) per week     Comment: rare    OB History   Grav Para Term Preterm Abortions TAB SAB Ect Mult Living                 Review of Systems  Constitutional: Negative for fever and chills.  HENT: Negative for neck pain and neck stiffness.   Eyes: Negative for pain.  Respiratory: Positive for cough, shortness of breath and wheezing.   Cardiovascular: Negative for chest pain.  Gastrointestinal: Negative for abdominal pain.  Genitourinary: Negative for dysuria.  Musculoskeletal: Negative for back pain.  Skin: Negative for rash.  Neurological: Negative for headaches.  All other systems reviewed and are negative.    Allergies  Lisinopril; Erythromycin; Iodine; Penicillins; Pentazocine lactate; Propoxyphene hcl; Quinapril hcl; and Sulfonamide derivatives  Home Medications   Current Outpatient Rx  Name  Route  Sig  Dispense  Refill  . albuterol (PROVENTIL) (2.5 MG/3ML) 0.083% nebulizer solution   Nebulization   Take 2.5 mg  by nebulization every 6 (six) hours as needed for shortness of breath.          Marland Kitchen aspirin EC 81 MG tablet   Oral   Take 81 mg by mouth daily.         . cyclobenzaprine (FLEXERIL) 10 MG tablet   Oral   Take 5-10 mg by mouth 3 (three) times daily as needed for muscle spasms.          Marland Kitchen donepezil (ARICEPT) 5 MG tablet   Oral   Take 5 mg by mouth at bedtime.          Marland Kitchen ezetimibe (ZETIA) 10 MG tablet   Oral   Take 10 mg by mouth daily.           . fenofibrate 160 MG tablet   Oral   Take 160 mg by mouth daily.         . fish oil-omega-3 fatty acids 1000 MG capsule   Oral   Take 1 g by mouth 2 (two) times daily.          . Fluticasone-Salmeterol  (ADVAIR DISKUS) 250-50 MCG/DOSE AEPB   Inhalation   Inhale 1 puff into the lungs every 12 (twelve) hours.         . hydrOXYzine (ATARAX/VISTARIL) 25 MG tablet   Oral   Take 12.5-25 mg by mouth every 6 (six) hours as needed for itching.          . insulin aspart (NOVOLOG) 100 unit/ml SOLN   Subcutaneous   Inject 5 Units into the skin 3 (three) times daily with meals.   1 pen   3   . insulin glargine (LANTUS SOLOSTAR) 100 UNIT/ML injection   Subcutaneous   Inject 10 Units into the skin at bedtime.          . JENTADUETO 2.03-999 MG TABS   Oral   Take 1 tablet by mouth 2 (two) times daily.          Marland Kitchen LORazepam (ATIVAN) 0.5 MG tablet   Oral   Take 0.5 mg by mouth 2 (two) times daily as needed for anxiety.         . metoprolol tartrate (LOPRESSOR) 25 MG tablet   Oral   Take 25 mg by mouth 2 (two) times daily.           Marland Kitchen oxyCODONE-acetaminophen (PERCOCET/ROXICET) 5-325 MG per tablet   Oral   Take 1 tablet by mouth every 6 (six) hours as needed for pain.          . polyethylene glycol powder (MIRALAX) powder   Oral   Take 17 g by mouth daily.           . prasugrel (EFFIENT) 10 MG TABS   Oral   Take 1 tablet (10 mg total) by mouth daily.   30 tablet   0   . predniSONE (DELTASONE) 10 MG tablet   Oral   Take 2 tablets (20 mg total) by mouth daily.   10 tablet   0   . pregabalin (LYRICA) 75 MG capsule   Oral   Take 75 mg by mouth 2 (two) times daily.           . rosuvastatin (CRESTOR) 20 MG tablet   Oral   Take 40 mg by mouth daily.         . timolol (BETIMOL) 0.5 % ophthalmic solution   Right Eye   Place 1 drop into the right  eye 2 (two) times daily.           Marland Kitchen tolterodine (DETROL LA) 4 MG 24 hr capsule   Oral   Take 4 mg by mouth daily.           . nitroGLYCERIN (NITROSTAT) 0.4 MG SL tablet   Sublingual   Place 0.4 mg under the tongue every 5 (five) minutes as needed. For chest pain.          BP 144/48  Pulse 121  Temp(Src)  97.8 F (36.6 C) (Oral)  SpO2 98% Physical Exam  Constitutional: She is oriented to person, place, and time. She appears well-developed and well-nourished.  HENT:  Head: Normocephalic and atraumatic.  Eyes: EOM are normal. Pupils are equal, round, and reactive to light. No scleral icterus.  Neck: Neck supple.  Cardiovascular: Regular rhythm and intact distal pulses.   Tachycardic  Pulmonary/Chest:  Tachypnea with moderately decreased bilateral breath sounds, rhonchi throughout and some expiratory wheezes.   Abdominal: Soft. She exhibits no distension. There is no tenderness.  Musculoskeletal: Normal range of motion. She exhibits no edema and no tenderness.  Neurological: She is alert and oriented to person, place, and time.  Skin: Skin is warm and dry.    ED Course  Procedures (including critical care time) Labs Reviewed  CBC - Abnormal; Notable for the following:    WBC 16.5 (*)    All other components within normal limits  POCT I-STAT, CHEM 8 - Abnormal; Notable for the following:    Potassium 3.3 (*)    BUN 24 (*)    Glucose, Bld 306 (*)    All other components within normal limits  PRO B NATRIURETIC PEPTIDE  POCT I-STAT TROPONIN I   Dg Chest Portable 1 View  05/20/2013   *RADIOLOGY REPORT*  Clinical Data: Short of breath.  COPD.  Asthma.  PORTABLE CHEST - 1 VIEW  Comparison: 05/06/2013.  Findings: Dense consolidation in the retrocardiac region compatible with left lower lobe pneumonia.  Aspiration is in the differential considerations.  There is volume loss and elevation of the left hemidiaphragm.  Cardiopericardial silhouette appears within normal limits for projection.  Right basilar density may represent airspace disease or atelectasis.  Aortic arch atherosclerosis. Follow-up to ensure radiographic clearing recommended.  Clearing is usually observed at 8 weeks.  IMPRESSION: Dense left lower lobe airspace disease compatible with pneumonia or aspiration.   Original Report  Authenticated By: Andreas Newport, M.D.    Date: 05/20/2013  Rate: 121  Rhythm: sinus tachycardia  QRS Axis: normal  Intervals: normal  ST/T Wave abnormalities: nonspecific ST/T changes  Conduction Disutrbances:none  Narrative Interpretation:   Old EKG Reviewed: changes noted NSR on previous ECG 05-06-13   Albuterol provided Solumedrol PTA  IV Levaquin.  IV vancomycin  Old records reviewed did have hospitalization within the last month  6:34 AM d/w MED Dr Onalee Hua, [facility] to admit [facility]  Dyspnea, cough and wheezing with history of COPD  Treated with albuterol - had Solu-Medrol and breathing treatment prior to arrival  Chest x-ray shows pneumonia, labs reviewed and has leukocytosis 16K  IV antibiotics provided  Medical admission   Sunnie Nielsen, MD 05/20/13 [phone]

## 2013-05-20 NOTE — Progress Notes (Signed)
ANTIBIOTIC CONSULT NOTE - INITIAL  Pharmacy Consult for vancomycin/zosyn Indication: HCAP  Allergies  Allergen Reactions  . Lisinopril     REACTION: respiratory arrest jan 2009  . Eggs Or Egg-Derived Products Diarrhea  . Erythromycin Other (See Comments)    Makes me feel weird   . Iodine     Pt may be allergic to iodine, does not remember the details, refuses contrast-CS  . Penicillins Itching and Other (See Comments)    Too much as a kid  . Pentazocine Lactate   . Propoxyphene Hcl   . Quinapril Hcl Itching and Other (See Comments)    Too much   . Sulfonamide Derivatives Other (See Comments)    Childhood allergy    Patient Measurements: Height: 5\' 4"  (162.6 cm) Weight: 141 lb 3.2 oz (64.048 kg) IBW/kg (Calculated) : 54.7 Adjusted Body Weight:   Vital Signs: Temp: 98 F (36.7 C) (07/01 0855) Temp src: Oral (07/01 0855) BP: 122/51 mmHg (07/01 0855) Pulse Rate: 105 (07/01 0855) Intake/Output from previous day:   Intake/Output from this shift:    Labs:  Recent Labs  05/20/13 0540 05/20/13 0549  WBC 16.5*  --   HGB 13.7 13.9  PLT 163  --   CREATININE  --  1.00   Estimated Creatinine Clearance: 44.6 ml/min (by C-G formula based on Cr of 1). No results found for this basename: VANCOTROUGH, VANCOPEAK, VANCORANDOM, GENTTROUGH, GENTPEAK, GENTRANDOM, TOBRATROUGH, TOBRAPEAK, TOBRARND, AMIKACINPEAK, AMIKACINTROU, AMIKACIN,  in the last 72 hours   Microbiology: No results found for this or any previous visit (from the past 720 hour(s)).  Medical History: Past Medical History  Diagnosis Date  . CAD (coronary artery disease)     s/p inferior STEMI 12/29/10 with rotablator atherectomy RCA 12/31/10 and DES x 2 RCA  . Ischemic cardiomyopathy     EF 45% by cath 12/29/10.   . Diabetes mellitus     type 2  . HTN (hypertension)   . Hyperlipidemia   . Diverticulosis   . Asthma   . COPD (chronic obstructive pulmonary disease)   . Small bowel obstruction     from a  sigmoid stricture  . Nephrolithiasis   . Sleep apnea   . Overweight(278.02)   . Osteoarthritis   . GERD (gastroesophageal reflux disease)   . Anxiety   . Glaucoma   . History of colonoscopy   . Memory deficit 04/23/2013    Assessment: 20 YOF admitted with shortness of breath and productive cough (yellow sputum), + fevers and chills. She was admitted to hospital in May placing her in HCAP category.    7/1 >>vancomycin  >> 7/1 >>zosyn  >>   7/1 Levofloxacin x 1 in ED  Tmax: 98 WBCs: 16.5 Renal: SCr = 1 for est CrCl = 17ml/min (C-G) and 59 ml/min (N)  7/1 blood: ordered 7/1 sputum: ordered 7/1 Legionella Ag 7/1 S. Pneumoniae Ag  Dose changes/drug level info:   Goal of Therapy:  Vancomycin trough level 15-20 mcg/ml  Plan:   Vancomycin was not charted as given in ED, vancomycin 1000mg  IV x 1 then 500mg  IV q12h  Check vancomycin trough at steady-state if remains on vancomycin  Zosyn 3.375gm IV q8h over 4h  Dannielle Huh 05/20/2013,10:41 AM

## 2013-05-20 NOTE — ED Notes (Signed)
[id number]<XB> Expected date:<BR> Expected time:<BR> Means of arrival:<BR> Comments:<BR> 4F not feeling well

## 2013-05-20 NOTE — H&P (Addendum)
[facility] History and Physical  MYLANI GENTRY VWU:[id number] DOB: 10-08-1941 DOA: 05/20/2013  Referring physician: ED physician PCP: [name], MD   Chief Complaint: Shortness of breath  HPI:  Patient is 72 year old female with COPD, presents to [facility] long emergency department with main concern of 2-3 day progressively worsening shortness of breath, initially started with exertion and now present at rest. This has been associated with productive cough of yellow sputum, subjective fevers and chills, progressive malaise and poor oral intake. Patient reports similar episodes in the past that were related to COPD flare. Patient denies any specific chest pain other than the one associated with cough. She denies abdominal or urinary concerns, no recent sick contacts or exposures. Patient denies orthopnea, no lower extremity swelling, no specific focal neurological symptoms.  In emergency department, patient persistently wheezing, responding well to albuterol nebulizers. Patient was started on Solu-Medrol and given one dose of 125 mg by IV. TR[facility] asked to admit to telemetry floor for COPD exacerbation.   Assessment and Plan:  Principal Problem:   Acute respiratory failure - This is likely multifactorial and secondary to COPD extirpation with coexisting pneumonia, questionable HCAP vs aspiration PNA - Will admit to telemetry floor and monitor vital signs per floor protocol - Pneumonia order set placed, will obtain sputum analysis, culture and Gram stain, urine Legionella and strep pneumo - Will treat with broad antibiotic given possibility of HCAP given recent discharge from the hospital, vancomycin and Zosyn IV, this may be narrowed down as clinical status improves - Will also placed on Solu-Medrol IV for now with planned taper to prednisone once patient clinically improved - Continue oxygen via nasal cannula Active Problems:   COPD exacerbation - Please see management above   DM2  (diabetes mellitus, type 2) - Uncontrolled with complications of neuropathy, last A1c May 2014 greater than 13 - We'll continue home regimen with insulin and we'll add sliding scale sensitive coverage for now - Will ask diabetic coordinator to provide consultation and recommendations and better diabetes control   Hypokalemia - Mild and likely secondary to albuterol, beta agonists - Patient given K. Dur 40 mEq in emergency department, will obtain BMP in the morning - Supplement as indicated   Essential hypertension, benign - Reasonable control on admission, monitor vitals per floor protocol  Code Status: Full Family Communication: Pt at bedside Disposition Plan: Admit to telemetry floor  Review of Systems:  Constitutional: Positive for fever, chills and malaise/fatigue. Negative for diaphoresis.  HENT: Negative for hearing loss, ear pain, nosebleeds, congestion, sore throat, neck pain, tinnitus and ear discharge.   Eyes: Negative for blurred vision, double vision, photophobia, pain, discharge and redness.  Respiratory: Negative for hemoptysis,and stridor.   Cardiovascular: Negative for chest pain, palpitations, orthopnea, claudication and leg swelling.  Gastrointestinal: Negative for nausea, vomiting and abdominal pain. Negative for heartburn, constipation, blood in stool and melena.  Genitourinary: Negative for dysuria, urgency, frequency, hematuria and flank pain.  Musculoskeletal: Negative for myalgias, back pain, joint pain and falls.  Skin: Negative for itching and rash.  Neurological: Negative for tingling, tremors, sensory change, speech change, focal weakness, loss of consciousness and headaches.  Endo/Heme/Allergies: Negative for environmental allergies and polydipsia. Does not bruise/bleed easily.  Psychiatric/Behavioral: Negative for suicidal ideas. The patient is not nervous/anxious.      Past Medical History  Diagnosis Date  . CAD (coronary artery disease)     s/p  inferior STEMI 12/29/10 with rotablator atherectomy RCA 12/31/10 and DES x 2 RCA  .  Ischemic cardiomyopathy     EF 45% by cath 12/29/10.   . Diabetes mellitus     type 2  . HTN (hypertension)   . Hyperlipidemia   . Diverticulosis   . Asthma   . COPD (chronic obstructive pulmonary disease)   . Small bowel obstruction     from a sigmoid stricture  . Nephrolithiasis   . Sleep apnea   . Overweight(278.02)   . Osteoarthritis   . GERD (gastroesophageal reflux disease)   . Anxiety   . Glaucoma   . History of colonoscopy   . Memory deficit 04/23/2013    Past Surgical History  Procedure Laterality Date  . Bilateral tubal ligation    . Lithotripsy    . 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 Cardiac surgery      Social History:  reports that she has been smoking Cigarettes.  She has a 12.5 pack-year smoking history. She has never used smokeless tobacco. She reports that she drinks about 0.5 ounces of alcohol per week. She reports that she does not use illicit drugs.  Allergies  Allergen Reactions  . Lisinopril     REACTION: respiratory arrest jan 2009  . Eggs Or Egg-Derived Products Diarrhea  . Erythromycin Other (See Comments)    Makes me feel weird   . Iodine     Pt may be allergic to iodine, does not remember the details, refuses contrast-CS  . Penicillins Itching and Other (See Comments)    Too much as a kid  . Pentazocine Lactate   . Propoxyphene Hcl   . Quinapril Hcl Itching and Other (See Comments)    Too much   . Sulfonamide Derivatives Other (See Comments)    Childhood allergy    Family History  Problem Relation Age of Onset  . Breast cancer Maternal Grandmother   . Diabetes Father   . Heart disease Father   . Diabetes Sister   . Colon cancer Neg Hx     Prior to Admission medications   Medication Sig Start Date End Date Taking? Authorizing Provider  albuterol (PROVENTIL) (2.5 MG/3ML) 0.083% nebulizer solution Take 2.5 mg by  nebulization every 6 (six) hours as needed for shortness of breath.    Yes Historical Provider, MD  aspirin EC 81 MG tablet Take 81 mg by mouth daily.   Yes Historical Provider, MD  cyclobenzaprine (FLEXERIL) 10 MG tablet Take 5-10 mg by mouth 3 (three) times daily as needed for muscle spasms.    Yes Historical Provider, MD  donepezil (ARICEPT) 5 MG tablet Take 5 mg by mouth at bedtime.    Yes Historical Provider, MD  ezetimibe (ZETIA) 10 MG tablet Take 10 mg by mouth daily.     Yes Historical Provider, MD  fenofibrate 160 MG tablet Take 160 mg by mouth daily.   Yes Historical Provider, MD  fish oil-omega-3 fatty acids 1000 MG capsule Take 1 g by mouth 2 (two) times daily.    Yes Historical Provider, MD  Fluticasone-Salmeterol (ADVAIR DISKUS) 250-50 MCG/DOSE AEPB Inhale 1 puff into the lungs every 12 (twelve) hours.   Yes Historical Provider, MD  hydrOXYzine (ATARAX/VISTARIL) 25 MG tablet Take 12.5-25 mg by mouth every 6 (six) hours as needed for itching.    Yes Historical Provider, MD  insulin aspart (NOVOLOG) 100 unit/ml SOLN Inject 5 Units into the skin 3 (three) times daily with meals. 04/04/13  Yes Alison Murray, MD  insulin glargine (LANTUS SOLOSTAR) 100 UNIT/ML  injection Inject 10 Units into the skin at bedtime.    Yes Historical Provider, MD  JENTADUETO 2.03-999 MG TABS Take 1 tablet by mouth 2 (two) times daily.  08/16/11  Yes Historical Provider, MD  LORazepam (ATIVAN) 0.5 MG tablet Take 0.5 mg by mouth 2 (two) times daily as needed for anxiety.   Yes Historical Provider, MD  metoprolol tartrate (LOPRESSOR) 25 MG tablet Take 25 mg by mouth 2 (two) times daily.     Yes Historical Provider, MD  oxyCODONE-acetaminophen (PERCOCET/ROXICET) 5-325 MG per tablet Take 1 tablet by mouth every 6 (six) hours as needed for pain.    Yes Historical Provider, MD  polyethylene glycol powder (MIRALAX) powder Take 17 g by mouth daily.     Yes Historical Provider, MD  prasugrel (EFFIENT) 10 MG TABS Take 1 tablet  (10 mg total) by mouth daily. 04/29/13  Yes [name], MD  predniSONE (DELTASONE) 10 MG tablet Take 2 tablets (20 mg total) by mouth daily. 05/06/13  Yes Toy Baker, MD  pregabalin (LYRICA) 75 MG capsule Take 75 mg by mouth 2 (two) times daily.     Yes Historical Provider, MD  rosuvastatin (CRESTOR) 20 MG tablet Take 40 mg by mouth daily. 12/20/12  Yes [name], MD  timolol (BETIMOL) 0.5 % ophthalmic solution Place 1 drop into the right eye 2 (two) times daily.     Yes Historical Provider, MD  tolterodine (DETROL LA) 4 MG 24 hr capsule Take 4 mg by mouth daily.     Yes Historical Provider, MD  nitroGLYCERIN (NITROSTAT) 0.4 MG SL tablet Place 0.4 mg under the tongue every 5 (five) minutes as needed. For chest pain.    Historical Provider, MD    Physical Exam: Filed Vitals:   05/20/13 0525 05/20/13 0827 05/20/13 0855  BP: 144/48 105/40 122/51  Pulse: 121  105  Temp: 97.8 F (36.6 C)  98 F (36.7 C)  TempSrc: Oral  Oral  Resp:  24 21  Height:   5\' 4"  (1.626 m)  Weight:   64.048 kg (141 lb 3.2 oz)  SpO2: 98% 93% 95%    Physical Exam  Constitutional: Appears well-developed and well-nourished. No distress.  HENT: Normocephalic. External right and left ear normal. Oropharynx is clear and moist.  Eyes: Conjunctivae and EOM are normal. PERRLA, no scleral icterus.  Neck: Normal ROM. Neck supple. No JVD. No tracheal deviation. No thyromegaly.  CVS: Regular rhythm, tachycardic, S1/S2 +, no murmurs, no gallops, no carotid bruit.  Pulmonary: Decreased breath sounds at bases, minimal expiratory wheezing present, no use of accessory muscles Abdominal: Soft. BS +,  no distension, tenderness, rebound or guarding.  Musculoskeletal: Normal range of motion. No edema and no tenderness.  Lymphadenopathy: No lymphadenopathy noted, cervical, inguinal. Neuro: Alert. Normal reflexes, muscle tone coordination. No cranial nerve deficit. Skin: Skin is warm and dry. No rash noted.  Not diaphoretic. No erythema. No pallor.  Psychiatric: Normal mood and affect. Behavior, judgment, thought content normal.   Labs on Admission:  Basic Metabolic Panel:  Recent Labs Lab 05/20/13 0549  NA 139  K 3.3*  CL 102  GLUCOSE 306*  BUN 24*  CREATININE 1.00   CBC:  Recent Labs Lab 05/20/13 0540 05/20/13 0549  WBC 16.5*  --   HGB 13.7 13.9  HCT 41.3 41.0  MCV 97.9  --   PLT 163  --    Radiological Exams on Admission: Dg Chest Portable 1 View  05/20/2013   *RADIOLOGY  REPORT*  Clinical Data: Short of breath.  COPD.  Asthma.  PORTABLE CHEST - 1 VIEW  Comparison: 05/06/2013.  Findings: Dense consolidation in the retrocardiac region compatible with left lower lobe pneumonia.  Aspiration is in the differential considerations.  There is volume loss and elevation of the left hemidiaphragm.  Cardiopericardial silhouette appears within normal limits for projection.  Right basilar density may represent airspace disease or atelectasis.  Aortic arch atherosclerosis. Follow-up to ensure radiographic clearing recommended.  Clearing is usually observed at 8 weeks.  IMPRESSION: Dense left lower lobe airspace disease compatible with pneumonia or aspiration.   Original Report Authenticated By: Andreas Newport, M.D.   EKG: Normal sinus rhythm, no ST/T wave changes  Debbora Presto, MD  [facility] Pager [phone]  If 7PM-7AM, please contact night-coverage [email or web address] Password [email or web address] 05/20/2013, 10:06 AM

## 2013-05-20 NOTE — Care Management Note (Signed)
    Page 1 of 1   05/20/2013     1:21:57 PM   CARE MANAGEMENT NOTE 05/20/2013  Patient:  [name], [name]   Account Number:  [id number]  Date Initiated:  05/20/2013  Documentation initiated by:  Lanier Clam  Subjective/Objective Assessment:   ADMITTED W/SOB.COPD ECAX,RESP FAILURE.     Action/Plan:   FROM HOME ALONE.HAS PCP,PHARMACY.   Anticipated DC Date:  05/23/2013   Anticipated DC Plan:  HOME/SELF CARE      DC Planning Services  CM consult      Choice offered to / List presented to:             Status of service:  In process, will continue to follow Medicare Important Message given?   (If response is "NO", the following Medicare IM given date fields will be blank) Date Medicare IM given:   Date Additional Medicare IM given:    Discharge Disposition:    Per UR Regulation:  Reviewed for med. necessity/level of care/duration of stay  If discussed at Long Length of Stay Meetings, dates discussed:    Comments:  05/20/13 [facility] RN,BSN NCM [phone]

## 2013-05-20 NOTE — Clinical Documentation Improvement (Signed)
THIS DOCUMENT IS NOT A PERMANENT PART OF THE MEDICAL RECORD  Please update your documentation with the medical record to reflect your response to this query. If you need help knowing how to do this please call [phone].  05/20/13  Dear Dr. Izola Price, I/ [facility]  In a better effort to capture your patient's severity of illness, reflect appropriate length of stay and utilization of resources, a review of the patient medical record has revealed the following indicators.    Based on your clinical judgment, please clarify and document in a progress note and/or discharge summary the clinical condition associated with the following supporting information:  In responding to this query please exercise your independent judgment.  The fact that a query is asked, does not imply that any particular answer is desired or expected.   Pt with COPD   Pt with "Dense left lower airspace disease compatible with pneumonia or aspiration."  Clarification Needed   Please clarify if there is a link between the pneumonia and aspiration and document in pn or d/c summary.   Possible Clinical Conditions?   _______Aspiration Pneumonia (POA?) _______Gram Negative Pneumonia (POA?)  Bacterial pneumonia, specify type if known (POA?) _______Klebsiella PNA _______E Coli PNA _______Pseudomonas PNA _______Candidiasis PNA _______Staph/MRSA PNA _______Strep PNA _______Pneumococcal PNA _______H Influenza PNA _______H Para Influenza PNA  _______Viral PNA (POA?)  _______Pneumonia (CAP, HAP) (POA?) _______Other Condition____________________ _______Cannot Clinically Determine     Supporting Information:  Risk Factors:  SOB, COPD, Acute Respiratory Failure, DM Hypopotassemia HTN  Radiology: 05/20/13 IMPRESSION:  Dense left lower lobe airspace disease compatible with pneumonia or  aspiration.     Treatment: albuterol (PROVENTIL) (5 MG/ML) 0.5% nebulizer solution 2.5 mg         You may use  possible, probable, or suspect with inpatient documentation. possible, probable, suspected diagnoses MUST be documented at the time of discharge  Reviewed: can not clinically determine a this time, ? Aspiration vs HCAP  Thank You,  Enis Slipper  RN, BSN, MSN/Inf, CCDS Clinical Documentation Specialist [facility] Dept Pager: [phone] / E-mail: [email or web address]  [phone] Health Information Management [place]

## 2013-05-20 NOTE — ED Notes (Signed)
Blood drawn for Blood Culture x 2 (on hold-- per EDP's verbal order).

## 2013-05-21 ENCOUNTER — Encounter (HOSPITAL_COMMUNITY): Payer: Self-pay

## 2013-05-21 DIAGNOSIS — J441 Chronic obstructive pulmonary disease with (acute) exacerbation: Secondary | ICD-10-CM

## 2013-05-21 DIAGNOSIS — E119 Type 2 diabetes mellitus without complications: Secondary | ICD-10-CM

## 2013-05-21 LAB — CBC
HCT: 38.2 % (ref 36.0–46.0)
Hemoglobin: 12.7 g/dL (ref 12.0–15.0)
MCH: 31.9 pg (ref 26.0–34.0)
MCHC: 33.2 g/dL (ref 30.0–36.0)
RBC: 3.98 MIL/uL (ref 3.87–5.11)

## 2013-05-21 LAB — BASIC METABOLIC PANEL
BUN: 23 mg/dL (ref 6–23)
CO2: 22 mEq/L (ref 19–32)
Chloride: 99 mEq/L (ref 96–112)
Glucose, Bld: 372 mg/dL — ABNORMAL HIGH (ref 70–99)
Potassium: 4.7 mEq/L (ref 3.5–5.1)
Sodium: 132 mEq/L — ABNORMAL LOW (ref 135–145)

## 2013-05-21 LAB — LEGIONELLA ANTIGEN, URINE: Legionella Antigen, Urine: NEGATIVE

## 2013-05-21 LAB — GLUCOSE, CAPILLARY: Glucose-Capillary: 391 mg/dL — ABNORMAL HIGH (ref 70–99)

## 2013-05-21 MED ORDER — PANTOPRAZOLE SODIUM 40 MG PO TBEC
40.0000 mg | DELAYED_RELEASE_TABLET | Freq: Every day | ORAL | Status: DC
  Administered 2013-05-22: 40 mg via ORAL
  Filled 2013-05-21: qty 1

## 2013-05-21 MED ORDER — INSULIN GLARGINE 100 UNIT/ML ~~LOC~~ SOLN
15.0000 [IU] | Freq: Every day | SUBCUTANEOUS | Status: DC
  Administered 2013-05-21: 15 [IU] via SUBCUTANEOUS
  Filled 2013-05-21 (×2): qty 0.15

## 2013-05-21 MED ORDER — CYCLOBENZAPRINE HCL 5 MG PO TABS
5.0000 mg | ORAL_TABLET | Freq: Three times a day (TID) | ORAL | Status: DC | PRN
  Filled 2013-05-21: qty 1

## 2013-05-21 MED ORDER — INSULIN ASPART 100 UNIT/ML ~~LOC~~ SOLN
0.0000 [IU] | Freq: Three times a day (TID) | SUBCUTANEOUS | Status: DC
  Administered 2013-05-22: 12 [IU] via SUBCUTANEOUS
  Administered 2013-05-22: 15 [IU] via SUBCUTANEOUS

## 2013-05-21 MED ORDER — INSULIN ASPART 100 UNIT/ML ~~LOC~~ SOLN
4.0000 [IU] | Freq: Three times a day (TID) | SUBCUTANEOUS | Status: DC
  Administered 2013-05-22 (×2): 4 [IU] via SUBCUTANEOUS

## 2013-05-21 MED ORDER — GUAIFENESIN 100 MG/5ML PO SOLN
200.0000 mg | ORAL | Status: DC | PRN
  Administered 2013-05-21: 200 mg via ORAL

## 2013-05-21 MED ORDER — PREDNISONE 50 MG PO TABS
50.0000 mg | ORAL_TABLET | Freq: Every day | ORAL | Status: DC
  Administered 2013-05-22: 50 mg via ORAL
  Filled 2013-05-21 (×2): qty 1

## 2013-05-21 NOTE — Progress Notes (Signed)
[facility] PROGRESS NOTE  [name] AVW:[id number] DOB: Sep 04, 1941 DOA: 05/20/2013 PCP: [name], MD  Assessment/Plan: 1-Acute resp failure: secondary to PNA and COPD exacerbation. -continue steroids (if she continue improving will switch to PO in am), antibiotics and nebulizer treatment -continue IS and supplemental oxygen  2-COPD exacerbation: treatment as mentioned above. Will adjust long term maintenance discharge inhalers.  3-DM: uncontrolled and worse due to steroids. Will adjust lantus. Continue SSI. Last A1C >13  4-Hypokalemia: repleted  5-HTN:overall well control and stable. Will continue home regimen.  6-GERD: continue PPI.  7-CAD: no CP, continue prasugrel and ASA  8-HLD: continue statins.  Code Status: Full Family Communication: no family at bedside Disposition Plan: back home when medically stable   Consultants:  none  Procedures:  See below for x-ray reports  Antibiotics:  vanc  zosyn  HPI/Subjective: Afebrile, no CP, reports improve in her breathing.  Objective: Filed Vitals:   05/21/13 0224 05/21/13 0444 05/21/13 0750 05/21/13 1347  BP:  131/56  110/41  Pulse:  72  75  Temp:  97.3 F (36.3 C)  97.8 F (36.6 C)  TempSrc:  Oral  Oral  Resp:  20    Height:      Weight:      SpO2: 92% 100% 94% 93%    Intake/Output Summary (Last 24 hours) at 05/21/13 1637 Last data filed at 05/21/13 1349  Gross per 24 hour  Intake   1120 ml  Output    500 ml  Net    620 ml   Filed Weights   05/20/13 0855  Weight: 64.048 kg (141 lb 3.2 oz)    Exam:   General:  Afebrile, no acute distress  Cardiovascular: S1 and S2, no rubs or gallops  Respiratory: no crackles, positive exp wheezing; no rales  Abdomen: soft, positive BS, no tenderness  Musculoskeletal: no LE edema  Data Reviewed: Basic Metabolic Panel:  Recent Labs Lab 05/20/13 0549 05/21/13 0505  NA 139 132*  K 3.3* 4.7  CL 102 99  CO2  --  22  GLUCOSE 306*  372*  BUN 24* 23  CREATININE 1.00 0.81  CALCIUM  --  9.6   CBC:  Recent Labs Lab 05/20/13 0540 05/20/13 0549 05/21/13 0505  WBC 16.5*  --  19.2*  HGB 13.7 13.9 12.7  HCT 41.3 41.0 38.2  MCV 97.9  --  96.0  PLT 163  --  182   BNP (last 3 results)  Recent Labs  05/20/13 0540  PROBNP 67.8   CBG:  Recent Labs Lab 05/20/13 1146 05/20/13 1640 05/20/13 2138 05/21/13 0752 05/21/13 1202  GLUCAP 446* 364* 321* 378* 326*    Recent Results (from the past 240 hour(s))  CULTURE, BLOOD (ROUTINE X 2)     Status: None   Collection Time    05/20/13 10:50 AM      Result Value Range Status   Specimen Description BLOOD RIGHT ARM   Final   Special Requests BOTTLES DRAWN AEROBIC AND ANAEROBIC 10CC   Final   Culture  Setup Time 05/20/2013 13:26   Final   Culture     Final   Value:        BLOOD CULTURE RECEIVED NO GROWTH TO DATE CULTURE WILL BE HELD FOR 5 DAYS BEFORE ISSUING A FINAL NEGATIVE REPORT   Report Status PENDING   Incomplete  CULTURE, BLOOD (ROUTINE X 2)     Status: None   Collection Time    05/20/13 11:00 AM  Result Value Range Status   Specimen Description BLOOD LEFT ARM   Final   Special Requests BOTTLES DRAWN AEROBIC AND ANAEROBIC 10CC   Final   Culture  Setup Time 05/20/2013 13:26   Final   Culture     Final   Value:        BLOOD CULTURE RECEIVED NO GROWTH TO DATE CULTURE WILL BE HELD FOR 5 DAYS BEFORE ISSUING A FINAL NEGATIVE REPORT   Report Status PENDING   Incomplete     Studies: Dg Chest Portable 1 View  05/20/2013   *RADIOLOGY REPORT*  Clinical Data: Short of breath.  COPD.  Asthma.  PORTABLE CHEST - 1 VIEW  Comparison: 05/06/2013.  Findings: Dense consolidation in the retrocardiac region compatible with left lower lobe pneumonia.  Aspiration is in the differential considerations.  There is volume loss and elevation of the left hemidiaphragm.  Cardiopericardial silhouette appears within normal limits for projection.  Right basilar density may represent  airspace disease or atelectasis.  Aortic arch atherosclerosis. Follow-up to ensure radiographic clearing recommended.  Clearing is usually observed at 8 weeks.  IMPRESSION: Dense left lower lobe airspace disease compatible with pneumonia or aspiration.   Original Report Authenticated By: Andreas Newport, M.D.    Scheduled Meds: . albuterol  2.5 mg Nebulization Q6H  . aspirin EC  81 mg Oral Daily  . donepezil  5 mg Oral QHS  . enoxaparin (LOVENOX) injection  40 mg Subcutaneous Q24H  . ezetimibe  10 mg Oral Daily  . fenofibrate  160 mg Oral Daily  . fesoterodine  4 mg Oral Daily  . guaiFENesin  600 mg Oral BID  . insulin aspart  0-9 Units Subcutaneous TID WC  . insulin glargine  15 Units Subcutaneous QHS  . linagliptin  2.5 mg Oral BID WC   And  . metFORMIN  1,000 mg Oral BID WC  . methylPREDNISolone (SOLU-MEDROL) injection  60 mg Intravenous Q12H  . metoprolol tartrate  25 mg Oral BID  . mometasone-formoterol  2 puff Inhalation BID  . piperacillin-tazobactam (ZOSYN)  IV  3.375 g Intravenous Q8H  . polyethylene glycol powder  17 g Oral Daily  . prasugrel  10 mg Oral Daily  . [START ON 05/22/2013] predniSONE  50 mg Oral Q breakfast  . pregabalin  75 mg Oral BID  . rosuvastatin  40 mg Oral q1800  . sodium chloride  3 mL Intravenous Q12H  . timolol  1 drop Right Eye BID  . vancomycin  500 mg Intravenous Q12H   Continuous Infusions:   Principal Problem:   Acute respiratory failure Active Problems:   DM2 (diabetes mellitus, type 2)   Hypokalemia   Essential hypertension, benign   COPD exacerbation    Time spent: >30 minutes    Jj Enyeart  [facility] Pager [phone]. If 7PM-7AM, please contact night-coverage at [email or web address], password [facility] 05/21/2013, 4:37 PM  LOS: 1 day

## 2013-05-21 NOTE — Progress Notes (Signed)
Inpatient Diabetes Program Recommendations  AACE/ADA: New Consensus Statement on Inpatient Glycemic Control (2013)  Target Ranges:  Prepandial:   less than 140 mg/dL      Peak postprandial:   less than 180 mg/dL (1-2 hours)      Critically ill patients:  140 - 180 mg/dL   Results for ALANTRA, POPOCA (MRN [id number]) as of 05/21/2013 13:07  Ref. Range 05/20/2013 11:46 05/20/2013 16:40 05/20/2013 21:38 05/21/2013 07:52 05/21/2013 12:02  Glucose-Capillary Latest Range: 70-99 mg/dL 409 (H) 811 (H) 914 (H) 378 (H) 326 (H)    Inpatient Diabetes Program Recommendations Insulin - Basal: Please increase Lantus to 20 units QHS. Correction (SSI): Please increase Novolog correction to resistant scale. Diet: Please change diet to Carb Modified Diabetic diet (current diet is regular).  Note: Patient has a history of diabetes and takes Lantus 10 units daily, Novolog 5 units TID with meals, and Jentaduet 2.03-999 mg BID at home for diabetes management.  Currently, patient is ordered to receive Lantus 10 units QHS, Novolog 0-9 units AC, Novolog 0-5 units HS, Tradjenta 2.5mg  BID, and Metformin 1000 mg BID for inpatient glycemic control.  Blood glucose over the past 24 hours has ranged from 326-446 mg/dl.  Note that patient is ordered Solumedrol 60 mg IV Q12H which is contributing to hyperglycemia.  Please increase Lantus to 20 units QHS, increase Novolog correction to resistant scale, and change diet to Carb Modified Diabetic diet to improve inpatient glycemic control while inpatient and on steroids.  Will continue to follow.  Thanks, Orlando Penner, RN, MSN, CCRN Diabetes Coordinator Inpatient Diabetes Program [phone]

## 2013-05-22 DIAGNOSIS — E876 Hypokalemia: Secondary | ICD-10-CM

## 2013-05-22 LAB — GLUCOSE, CAPILLARY
Glucose-Capillary: 346 mg/dL — ABNORMAL HIGH (ref 70–99)
Glucose-Capillary: 255 mg/dL — ABNORMAL HIGH (ref 70–99)

## 2013-05-22 LAB — MAGNESIUM: Magnesium: 1.7 mg/dL (ref 1.5–2.5)

## 2013-05-22 MED ORDER — PANTOPRAZOLE SODIUM 40 MG PO TBEC: 40.0000 mg | DELAYED_RELEASE_TABLET | Freq: Every day | ORAL | Status: DC

## 2013-05-22 MED ORDER — CYCLOBENZAPRINE HCL 10 MG PO TABS: 5.0000 mg | ORAL_TABLET | Freq: Three times a day (TID) | ORAL | Status: DC | PRN

## 2013-05-22 MED ORDER — INSULIN GLARGINE 100 UNIT/ML ~~LOC~~ SOLN: 16.0000 [IU] | Freq: Every day | SUBCUTANEOUS | Status: DC

## 2013-05-22 MED ORDER — PREDNISONE 10 MG PO TABS: ORAL_TABLET | ORAL | Status: DC

## 2013-05-22 MED ORDER — INSULIN ASPART 100 UNIT/ML FLEXPEN: 7.0000 [IU] | Freq: Three times a day (TID) | SUBCUTANEOUS | Status: DC

## 2013-05-22 MED ORDER — GUAIFENESIN ER 600 MG PO TB12: 600.0000 mg | ORAL_TABLET | Freq: Two times a day (BID) | ORAL | Status: DC

## 2013-05-22 MED ORDER — LEVOFLOXACIN 750 MG PO TABS: 750.0000 mg | ORAL_TABLET | Freq: Every day | ORAL | Status: AC

## 2013-05-22 NOTE — Progress Notes (Signed)
Patient evaluated for long-term disease management services with [facility] as a benefit of her [employer]. Reports she plans to return home. She has a niece that stays with her Mon-Friday during the day to assist. Reports she has another niece that stays with her at times as well. Has supportive family. Explained services and consents were signed. Patient will receive a post discharge transition of care call upon discharge.    Raiford Noble, MSN-Ed, RN,BSN, [facility], [phone]

## 2013-05-22 NOTE — Discharge Summary (Signed)
Physician Discharge Summary  [name] AOZ:[id number] DOB: August 29, 1941 DOA: 05/20/2013  PCP: [name], MD  Admit date: 05/20/2013 Discharge date: 05/22/2013  Time spent: >30 minutes  Recommendations for Outpatient Follow-up:  -Please reassess CBG's and adjust insulin as needed -Patient will benefit of pulmonology visit as an outpatient for further eval/treatment; please arrange that visit after acute exacerbation/PNA tx resolved -CXR in 4 weeks to follow resolution of infiltrates -CBC and BMET to follow WBC/Hgb level and her kidney functiona nd electrolytes.  Discharge Diagnoses:  Principal Problem:   Acute respiratory failure Active Problems:   DM2 (diabetes mellitus, type 2)   Hypokalemia   Essential hypertension, benign   COPD exacerbation   Discharge Condition: stable and improved. Will discharge home with family care. Patient will follow with PCP in 7-10 days  Diet recommendation: low carbohydrates diet  Filed Weights   05/20/13 0855 05/22/13 0438  Weight: 64.048 kg (141 lb 3.2 oz) 63.186 kg (139 lb 4.8 oz)    History of present illness:  72 year old female with COPD, presents to [facility] long emergency department with main concern of 2-3 day progressively worsening shortness of breath, initially started with exertion and now present at rest. This has been associated with productive cough of yellow sputum, subjective fevers and chills, progressive malaise and poor oral intake. Patient reports similar episodes in the past that were related to COPD flare. Patient denies any specific chest pain other than the one associated with cough. She denies abdominal or urinary concerns, no recent sick contacts or exposures. Patient denies orthopnea, no lower extremity swelling, no specific focal neurological symptoms.  In emergency department, patient persistently wheezing, responding well to albuterol nebulizers.   Hospital Course:  1-Acute resp failure: secondary to PNA and COPD  exacerbation.  -steroids tapering -continue IS  -will discharge home on levaquin to finish abx's therapy -continue PRN nebulizer and inhaler.  2-COPD exacerbation: treatment as mentioned above.   3-DM: uncontrolled and worse due to steroids. Will adjust lantus to 16 units, continue oral hypoglycemics agents and Novolog for meal coverage. Last A1C >13. Patient to follow with PCP for further adjustemnts.   4-Hypokalemia: repleted   5-HTN: overall well control and stable. Will continue home regimen.   6-GERD and GI prophylaxis: continue PPI.   7-CAD: no CP, continue prasugrel and ASA   8-HLD: continue statins.  *Rest of medical problems remains stable and the plan is to continue current medication regimen and follow with PCP for further adjustments.  Procedures: See below for x-ray reports  Consultations:  none  Discharge Exam: Filed Vitals:   05/21/13 2042 05/22/13 0239 05/22/13 0438 05/22/13 1029  BP: 115/49  107/55   Pulse: 84  68   Temp: 98 F (36.7 C)  97.4 F (36.3 C)   TempSrc: Oral  Oral   Resp: 20  20   Height:      Weight:   63.186 kg (139 lb 4.8 oz)   SpO2: 98% 95% 97% 94%    General: Afebrile; no CP, no SOB; no wheezing and with good O2 sat at RA Cardiovascular: S1 and S2, no rubs or gallops Respiratory: no wheezing, no crackles; good air movement; scattered rhonchi Abdomen:soft, NT, positive BS Extremities: no edema or cyanosis Neuro: no new focal deficit.  Discharge Instructions  Discharge Orders   Future Appointments Provider Department Dept Phone   06/18/2013 11:45 AM [name], MD [facility] Main Office [place]) [phone]   10/23/2013 3:00 PM Nilda Riggs, NP [facility] [phone]   Future Orders Complete By Expires     Discharge instructions  As directed     Comments:      Arrange follow up with PCP in 7-10 days Take medications as prescribed Keep yourself hydrated Follow a low  carbohydrates diet        Medication List         ADVAIR DISKUS 250-50 MCG/DOSE Aepb  Generic drug:  Fluticasone-Salmeterol  Inhale 1 puff into the lungs every 12 (twelve) hours.     albuterol (2.5 MG/3ML) 0.083% nebulizer solution  Commonly known as:  PROVENTIL  Take 2.5 mg by nebulization every 6 (six) hours as needed for shortness of breath.     ARICEPT 5 MG tablet  Generic drug:  donepezil  Take 5 mg by mouth at bedtime.     aspirin EC 81 MG tablet  Take 81 mg by mouth daily.     cyclobenzaprine 10 MG tablet  Commonly known as:  FLEXERIL  Take 0.5 tablets (5 mg total) by mouth 3 (three) times daily as needed for muscle spasms.     ezetimibe 10 MG tablet  Commonly known as:  ZETIA  Take 10 mg by mouth daily.     fenofibrate 160 MG tablet  Take 160 mg by mouth daily.     fish oil-omega-3 fatty acids 1000 MG capsule  Take 1 g by mouth 2 (two) times daily.     guaiFENesin 600 MG 12 hr tablet  Commonly known as:  MUCINEX  Take 1 tablet (600 mg total) by mouth 2 (two) times daily.     hydrOXYzine 25 MG tablet  Commonly known as:  ATARAX/VISTARIL  Take 12.5-25 mg by mouth every 6 (six) hours as needed for itching.     insulin aspart 100 unit/ml Soln  Commonly known as:  novoLOG  Inject 7 Units into the skin 3 (three) times daily with meals.     insulin glargine 100 UNIT/ML injection  Commonly known as:  LANTUS  Inject 0.16 mLs (16 Units total) into the skin at bedtime.     JENTADUETO 2.03-999 MG Tabs  Generic drug:  Linagliptin-Metformin HCl  Take 1 tablet by mouth 2 (two) times daily.     levofloxacin 750 MG tablet  Commonly known as:  LEVAQUIN  Take 1 tablet (750 mg total) by mouth daily.     LORazepam 0.5 MG tablet  Commonly known as:  ATIVAN  Take 0.5 mg by mouth 2 (two) times daily as needed for anxiety.     metoprolol tartrate 25 MG tablet  Commonly known as:  LOPRESSOR  Take 25 mg by mouth 2 (two) times daily.     MIRALAX powder  Generic drug:   polyethylene glycol powder  Take 17 g by mouth daily.     nitroGLYCERIN 0.4 MG SL tablet  Commonly known as:  NITROSTAT  Place 0.4 mg under the tongue every 5 (five) minutes as needed. For chest pain.     oxyCODONE-acetaminophen 5-325 MG per tablet  Commonly known as:  PERCOCET/ROXICET  Take 1 tablet by mouth every 6 (six) hours as needed for pain.     pantoprazole 40 MG tablet  Commonly known as:  PROTONIX  Take 1 tablet (40 mg total) by mouth daily at 12 noon.     prasugrel 10 MG Tabs  Commonly known as:  EFFIENT  Take 1 tablet (10 mg total) by mouth daily.     predniSONE 10 MG tablet  Commonly known as:  DELTASONE  Take 5 tablets by mouth daily X 1 day; then 4 tablets by mouth daily X 2 days; then 3 tablets by mouth daily X 3 days and then resume your 2 tablets by mouth daily.     pregabalin 75 MG capsule  Commonly known as:  LYRICA  Take 75 mg by mouth 2 (two) times daily.     rosuvastatin 20 MG tablet  Commonly known as:  CRESTOR  Take 40 mg by mouth daily.     timolol 0.5 % ophthalmic solution  Commonly known as:  BETIMOL  Place 1 drop into the right eye 2 (two) times daily.     tolterodine 4 MG 24 hr capsule  Commonly known as:  DETROL LA  Take 4 mg by mouth daily.       Allergies  Allergen Reactions  . Lisinopril     REACTION: respiratory arrest jan 2009  . Eggs Or Egg-Derived Products Diarrhea  . Erythromycin Other (See Comments)    Makes me feel weird   . Iodine     Pt may be allergic to iodine, does not remember the details, refuses contrast-CS  . Penicillins Itching and Other (See Comments)    Too much as a kid  . Pentazocine Lactate   . Propoxyphene Hcl   . Quinapril Hcl Itching and Other (See Comments)    Too much   . Sulfonamide Derivatives Other (See Comments)    Childhood allergy       Follow-up Information   Follow up with WHITE,CYNTHIA S, MD. Schedule an appointment as soon as possible for a visit in 10 days. (Patient has a follow up  appointment with Stacie Acres. White on Friday, May 30, 2013 at 11:45 a.m.)    Contact information:   [address] [place] [address] [phone]        The results of significant diagnostics from this hospitalization (including imaging, microbiology, ancillary and laboratory) are listed below for reference.    Significant Diagnostic Studies: Dg Chest 2 View  05/06/2013   *RADIOLOGY REPORT*  Clinical Data: Pain  CHEST - 2 VIEW  Comparison: 04/02/2013  Findings: Normal heart size.  No pleural effusion or edema identified.  No airspace consolidation noted.  Mild spondylosis noted within the thoracic spine.  Coronary artery stents are identified within the LAD.  IMPRESSION:  1.  No acute cardiopulmonary abnormalities.   Original Report Authenticated By: Signa Kell, M.D.   Dg Chest Portable 1 View  05/20/2013   *RADIOLOGY REPORT*  Clinical Data: Short of breath.  COPD.  Asthma.  PORTABLE CHEST - 1 VIEW  Comparison: 05/06/2013.  Findings: Dense consolidation in the retrocardiac region compatible with left lower lobe pneumonia.  Aspiration is in the differential considerations.  There is volume loss and elevation of the left hemidiaphragm.  Cardiopericardial silhouette appears within normal limits for projection.  Right basilar density may represent airspace disease or atelectasis.  Aortic arch atherosclerosis. Follow-up to ensure radiographic clearing recommended.  Clearing is usually observed at 8 weeks.  IMPRESSION: Dense left lower lobe airspace disease compatible with pneumonia or aspiration.   Original Report Authenticated By: Andreas Newport, M.D.    Microbiology: Recent Results (from the past 240 hour(s))  CULTURE, BLOOD (ROUTINE X 2)     Status: None   Collection Time    05/20/13 10:50 AM      Result Value Range Status   Specimen Description BLOOD RIGHT ARM   Final  Special Requests BOTTLES DRAWN AEROBIC AND ANAEROBIC 10CC   Final   Culture  Setup Time 05/20/2013 13:26   Final    Culture     Final   Value:        BLOOD CULTURE RECEIVED NO GROWTH TO DATE CULTURE WILL BE HELD FOR 5 DAYS BEFORE ISSUING A FINAL NEGATIVE REPORT   Report Status PENDING   Incomplete  CULTURE, BLOOD (ROUTINE X 2)     Status: None   Collection Time    05/20/13 11:00 AM      Result Value Range Status   Specimen Description BLOOD LEFT ARM   Final   Special Requests BOTTLES DRAWN AEROBIC AND ANAEROBIC 10CC   Final   Culture  Setup Time 05/20/2013 13:26   Final   Culture     Final   Value:        BLOOD CULTURE RECEIVED NO GROWTH TO DATE CULTURE WILL BE HELD FOR 5 DAYS BEFORE ISSUING A FINAL NEGATIVE REPORT   Report Status PENDING   Incomplete     Labs: Basic Metabolic Panel:  Recent Labs Lab 05/20/13 0549 05/21/13 0505 05/22/13 0755  NA 139 132*  --   K 3.3* 4.7  --   CL 102 99  --   CO2  --  22  --   GLUCOSE 306* 372*  --   BUN 24* 23  --   CREATININE 1.00 0.81  --   CALCIUM  --  9.6  --   MG  --   --  1.7   CBC:  Recent Labs Lab 05/20/13 0540 05/20/13 0549 05/21/13 0505  WBC 16.5*  --  19.2*  HGB 13.7 13.9 12.7  HCT 41.3 41.0 38.2  MCV 97.9  --  96.0  PLT 163  --  182   BNP: BNP (last 3 results)  Recent Labs  05/20/13 0540  PROBNP 67.8   CBG:  Recent Labs Lab 05/21/13 1202 05/21/13 1647 05/21/13 2151 05/22/13 0706 05/22/13 1150  GLUCAP 326* 391* 346* 387* 255*    Signed:  Elim Economou  [facility] 05/22/2013, 12:19 PM

## 2013-05-26 ENCOUNTER — Other Ambulatory Visit: Payer: Self-pay | Admitting: Cardiovascular Disease

## 2013-05-26 LAB — CULTURE, BLOOD (ROUTINE X 2): Culture: NO GROWTH

## 2013-06-12 ENCOUNTER — Ambulatory Visit (INDEPENDENT_AMBULATORY_CARE_PROVIDER_SITE_OTHER)
Admission: RE | Admit: 2013-06-12 | Discharge: 2013-06-12 | Disposition: A | Discharge status: Home / Self Care | Payer: Medicare Other | Source: Ambulatory Visit | Attending: Internal Medicine | Admitting: Internal Medicine

## 2013-06-12 ENCOUNTER — Encounter: Payer: Self-pay | Admitting: Internal Medicine

## 2013-06-12 ENCOUNTER — Ambulatory Visit (INDEPENDENT_AMBULATORY_CARE_PROVIDER_SITE_OTHER): Payer: Medicare Other | Admitting: Internal Medicine

## 2013-06-12 VITALS — BP 108/62 | HR 74 | Temp 97.6°F | Ht 65.0 in | Wt 144.6 lb

## 2013-06-12 DIAGNOSIS — R05 Cough: Secondary | ICD-10-CM

## 2013-06-12 DIAGNOSIS — R059 Cough, unspecified: Secondary | ICD-10-CM

## 2013-06-12 DIAGNOSIS — J441 Chronic obstructive pulmonary disease with (acute) exacerbation: Secondary | ICD-10-CM

## 2013-06-12 DIAGNOSIS — R0602 Shortness of breath: Secondary | ICD-10-CM

## 2013-06-12 NOTE — Patient Instructions (Addendum)
Have CXR today; will call with result. Depending on this might need CT chest Finish levaquin and prednisone ordered by [name], MD Return to see me or NP [name]  in 3-4 weeks with COPD CAT Score at followup

## 2013-06-12 NOTE — Progress Notes (Signed)
Subjective:    Patient ID: [name], female    DOB: 22-Oct-1941, 72 y.o.   MRN: [id number]  HPI Problem List 1. Ex Heavy Smoker 2. 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 Refuses spiriva, flu shot, rehab 3. AECOPD hx - Aug 2011  (office Rx)   OV 09/20/11: FU for above. Last visit over a year ago on 08/01/2010. Doing well. Though admitted on ROS she has chest tightness, dyspnea and sinus issues to CMA, she is denying this to me. Feels well. AGain, refused rehab and flu shot. No new complains  REC Clinically copd appears stable Continue advair - nurse will ensure that you have enough refillls Return in 9 month for followup I reespect your decision to refuse flu shot Next visit, will check walking exertion, alpha 1 and spirometry  OV 06/12/2013    Admitted earlier in [place] for 2 days with worsening cough and yellow sputum for  1 months and dx with LL PNAand AECOPD. Dc on abx and pred taper and improved but cough worsened again. 3 days ago startred on levaquin and pred taper (few week taper) by [name], MD. Feeling fatigued overall. COPD CAT score is 29. Can make bed and overall fatigued    CAT COPD Symptom & Quality of Life Score (GSK trademark) 0 is no burden. 5 is highest burden 06/12/2013   Never Cough -> Cough all the time 4  No phlegm in chest -> Chest is full of phlegm 3  No chest tightness -> Chest feels very tight 2  No dyspnea for 1 flight stairs/hill -> Very dyspneic for 1 flight of stairs 4  No limitations for ADL at home -> Very limited with ADL at home 3  Confident leaving home -> Not at all confident leaving home 3  Sleep soundly -> Do not sleep soundly because of lung condition 5  Lots of Energy -> No energy at all 5  TOTAL Score (max 40)  29      Review of Systems  Constitutional: Negative for fever and unexpected weight change.  HENT: Positive for congestion and postnasal drip. Negative for ear pain,  nosebleeds, sore throat, rhinorrhea, sneezing, trouble swallowing, dental problem and sinus pressure.   Eyes: Negative for redness and itching.  Respiratory: Positive for cough, chest tightness, shortness of breath and wheezing.   Cardiovascular: Negative for palpitations and leg swelling.  Gastrointestinal: Negative for nausea and vomiting.  Genitourinary: Negative for dysuria.  Musculoskeletal: Negative for joint swelling.  Skin: Negative for rash.  Neurological: Negative for headaches.  Hematological: Does not bruise/bleed easily.  Psychiatric/Behavioral: Negative for dysphoric mood. The patient is not nervous/anxious.        Objective:   Physical Exam Vitals reviewed. Constitutional: She is oriented to person, place, and time. She appears well-developed and well-nourished. No distress.       overweight  HENT:  Head: Normocephalic and atraumatic.  Right Ear: External ear normal.  Left Ear: External ear normal.  Mouth/Throat: Oropharynx is clear and moist. No oropharyngeal exudate.  Eyes: Conjunctivae and EOM are normal. Pupils are equal, round, and reactive to light. Right eye exhibits no discharge. Left eye exhibits no discharge. No scleral icterus.  Neck: Normal range of motion. Neck supple. No JVD present. No tracheal deviation present. No thyromegaly present.  Cardiovascular: Normal rate, regular rhythm, normal heart sounds and intact distal pulses.  Exam reveals no gallop and no friction rub.   No  murmur heard. Pulmonary/Chest: Effort normal and breath sounds normal. No respiratory distress. She has no wheezes. She has no rales. She exhibits no tenderness.  Abdominal: Soft. Bowel sounds are normal. She exhibits no distension and no mass. There is no tenderness. There is no rebound and no guarding.  Musculoskeletal: Normal range of motion. She exhibits no edema and no tenderness.  Lymphadenopathy:    She has no cervical adenopathy.  Neurological: She is alert and oriented to  person, place, and time. She has normal reflexes. No cranial nerve deficit. She exhibits normal muscle tone. Coordination normal.  Skin: Skin is warm and dry. No rash noted. She is not diaphoretic. No erythema. No pallor.  Psychiatric: She has a normal mood and affect. Her behavior is normal. Judgment and thought content normal.         Assessment & Plan:

## 2013-06-18 ENCOUNTER — Encounter: Payer: Self-pay | Admitting: Cardiovascular Disease

## 2013-06-18 ENCOUNTER — Other Ambulatory Visit: Payer: Self-pay | Admitting: *Deleted

## 2013-06-18 ENCOUNTER — Ambulatory Visit (INDEPENDENT_AMBULATORY_CARE_PROVIDER_SITE_OTHER): Payer: Medicare Other | Admitting: Cardiovascular Disease

## 2013-06-18 VITALS — BP 100/60 | HR 79 | Ht 65.0 in | Wt 141.0 lb

## 2013-06-18 DIAGNOSIS — I10 Essential (primary) hypertension: Secondary | ICD-10-CM

## 2013-06-18 DIAGNOSIS — I2510 Atherosclerotic heart disease of native coronary artery without angina pectoris: Secondary | ICD-10-CM

## 2013-06-18 DIAGNOSIS — E785 Hyperlipidemia, unspecified: Secondary | ICD-10-CM

## 2013-06-18 MED ORDER — NITROGLYCERIN 0.4 MG SL SUBL: 0.4000 mg | SUBLINGUAL_TABLET | SUBLINGUAL | Status: DC | PRN

## 2013-06-18 MED ORDER — METOPROLOL TARTRATE 25 MG PO TABS: 12.5000 mg | ORAL_TABLET | Freq: Two times a day (BID) | ORAL | Status: DC

## 2013-06-18 MED ORDER — CLOPIDOGREL BISULFATE 75 MG PO TABS: 75.0000 mg | ORAL_TABLET | Freq: Every day | ORAL | Status: DC

## 2013-06-18 MED ORDER — PRAVASTATIN SODIUM 40 MG PO TABS: 40.0000 mg | ORAL_TABLET | Freq: Every evening | ORAL | Status: DC

## 2013-06-18 NOTE — Patient Instructions (Signed)
Your physician wants you to follow-up in:  6 months.  You will receive a reminder letter in the mail two months in advance. If you don't receive a letter, please call our office to schedule the follow-up appointment.  Your physician recommends that you return for fasting lab work in:  3 months.--Lipid and Liver profile--week of October 27,2014. The lab opens at 7:30 AM every week day.  Your physician has recommended you make the following change in your medication:   Stop Zetia. Stop fenofibrate. Stop fish oil. Stop Effient. Stop Crestor. Start Clopidogrel 75 mg by mouth daily Start Pravastatin 40 mg by mouth daily at bedtime. Decrease lopressor to 12.5 mg by mouth twice daily

## 2013-06-18 NOTE — Progress Notes (Signed)
History of Present Illness: 72 yo female with history of DM2, HTN, HL, tobacco abuse and CAD here today for cardiac follow up. She was admitted February 2012 with an acute inferior STEMI. Emergent cath 12/28/10 with serial lesions in subtotally occluded RCA. We were unable to stent the vessel during the index procedure because of severe calcification in the mid vessel. Flow was established with balloon inflations and she was treated with Integrilin, Effient and ASA. She had staged PCI 12/31/10 at which time a rotablator atherectomy of the mid RCA was performed. She did well with both procedures. Two overlapping drug eluting stents were placed in the mid RCA. EF was found to be 45 % at the time of the cath. There was mild disease in the LAD and the Circumflex arteries. Echo 5/12: EF 55-60%, grade 1 diastolic dysfunction, mild MR, mild LAE. Complaint of palpitations 10/30/12. 21 day monitor showed no arrythmias. I last saw her January 2014 and she was doing well.   She is here today for follow up. She tells me that she is feeling well. No chest pain or SOB. Some palpitations but unchanged in frequency. No near syncope or syncope.   Primary Care Physician: Laurann Montana   Last Lipid Profile:Lipid Panel     Component Value Date/Time   CHOL 69 12/19/2012 1302   TRIG 138.0 12/19/2012 1302   HDL 31.30* 12/19/2012 1302   CHOLHDL 2 12/19/2012 1302   VLDL 27.6 12/19/2012 1302   LDLCALC 10 12/19/2012 1302     Past Medical History  Diagnosis Date  . CAD (coronary artery disease)     s/p inferior STEMI 12/29/10 with rotablator atherectomy RCA 12/31/10 and DES x 2 RCA  . Ischemic cardiomyopathy     EF 45% by cath 12/29/10.   . Diabetes mellitus     type 2  . HTN (hypertension)   . Hyperlipidemia   . Diverticulosis   . Asthma   . COPD (chronic obstructive pulmonary disease)   . Small bowel obstruction     from a sigmoid stricture  . Nephrolithiasis   . Sleep apnea   . Overweight(278.02)   .  Osteoarthritis   . GERD (gastroesophageal reflux disease)   . Anxiety   . Glaucoma   . History of colonoscopy   . Memory deficit 04/23/2013    Past Surgical History  Procedure Laterality Date  . Bilateral tubal ligation    . Lithotripsy    . 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 Cardiac surgery      Current Outpatient Prescriptions  Medication Sig Dispense Refill  . albuterol (PROVENTIL) (2.5 MG/3ML) 0.083% nebulizer solution Take 2.5 mg by nebulization every 6 (six) hours as needed for shortness of breath.       Marland Kitchen aspirin EC 81 MG tablet Take 81 mg by mouth daily.      Marland Kitchen donepezil (ARICEPT) 5 MG tablet Take 5 mg by mouth at bedtime.       Marland Kitchen EFFIENT 10 MG TABS TAKE 1 TABLET BY MOUTH EVERY DAY  30 tablet  0  . ezetimibe (ZETIA) 10 MG tablet Take 10 mg by mouth daily.        . fenofibrate 160 MG tablet Take 160 mg by mouth daily.      . fish oil-omega-3 fatty acids 1000 MG capsule Take 1 g by mouth 2 (two) times daily.       Marland Kitchen guaiFENesin (MUCINEX) 600 MG 12 hr tablet Take  1 tablet (600 mg total) by mouth 2 (two) times daily.  30 tablet  0  . hydrOXYzine (ATARAX/VISTARIL) 25 MG tablet Take 12.5-25 mg by mouth every 6 (six) hours as needed for itching.       . insulin aspart (NOVOLOG) 100 unit/ml SOLN Inject 7 Units into the skin 3 (three) times daily with meals.  1 pen  3  . insulin glargine (LANTUS) 100 UNIT/ML injection Inject 0.16 mLs (16 Units total) into the skin at bedtime.      . JENTADUETO 2.03-999 MG TABS Take 1 tablet by mouth 2 (two) times daily.       Marland Kitchen levofloxacin (LEVAQUIN) 500 MG tablet Take 500 mg by mouth daily.      Marland Kitchen LORazepam (ATIVAN) 0.5 MG tablet Take 0.5 mg by mouth 2 (two) times daily as needed for anxiety.      . metoprolol tartrate (LOPRESSOR) 25 MG tablet Take 25 mg by mouth 2 (two) times daily.        . mometasone-formoterol (DULERA) 200-5 MCG/ACT AERO Inhale 2 puffs into the lungs 2 (two) times daily.      . nitroGLYCERIN  (NITROSTAT) 0.4 MG SL tablet Place 0.4 mg under the tongue every 5 (five) minutes as needed. For chest pain.      Marland Kitchen oxyCODONE-acetaminophen (PERCOCET/ROXICET) 5-325 MG per tablet Take 1 tablet by mouth every 6 (six) hours as needed for pain.       . pantoprazole (PROTONIX) 40 MG tablet Take 1 tablet (40 mg total) by mouth daily at 12 noon.  30 tablet  1  . polyethylene glycol powder (MIRALAX) powder Take 17 g by mouth daily.        . predniSONE (DELTASONE) 10 MG tablet Take 5 tablets by mouth daily X 1 day; then 4 tablets by mouth daily X 2 days; then 3 tablets by mouth daily X 3 days and then resume your 2 tablets by mouth daily.  22 tablet  0  . pregabalin (LYRICA) 75 MG capsule Take 75 mg by mouth 2 (two) times daily.        . rosuvastatin (CRESTOR) 20 MG tablet Take 40 mg by mouth daily.      . timolol (BETIMOL) 0.5 % ophthalmic solution Place 1 drop into the right eye 2 (two) times daily.        Marland Kitchen tolterodine (DETROL LA) 4 MG 24 hr capsule Take 4 mg by mouth daily.         No current facility-administered medications for this visit.    Allergies  Allergen Reactions  . Lisinopril     REACTION: respiratory arrest jan 2009  . Eggs Or Egg-Derived Products Diarrhea  . Erythromycin Other (See Comments)    Makes me feel weird   . Iodine     Pt may be allergic to iodine, does not remember the details, refuses contrast-CS  . Penicillins Itching and Other (See Comments)    Too much as a kid  . Pentazocine Lactate   . Propoxyphene Hcl   . Quinapril Hcl Itching and Other (See Comments)    Too much   . Sulfonamide Derivatives Other (See Comments)    Childhood allergy    History   Social History  . Marital Status: Married    Spouse Name: N/A    Number of Children: N/A  . Years of Education: N/A   Occupational History  . Retired     Programmer, systems    Social History Main  Topics  . Smoking status: Current Every Day Smoker -- 0.50 packs/day for 25 years    Types:  Cigarettes    Last Attempt to Quit: 10/20/2004  . Smokeless tobacco: Never Used     Comment: over a ppd for 40+ years; quit 10/2004  . Alcohol Use: 0.5 oz/week    1 drink(s) per week     Comment: rare   . Drug Use: No  . Sexually Active: No   Other Topics Concern  . Not on file   Social History Narrative  . No narrative on file    Family History  Problem Relation Age of Onset  . Breast cancer Maternal Grandmother   . Diabetes Father   . Heart disease Father   . Diabetes Sister   . Colon cancer Neg Hx     Review of Systems:  As stated in the HPI and otherwise negative.   BP 100/60  Pulse 79  Ht 5\' 5"  (1.651 m)  Wt 141 lb (63.957 kg)  BMI 23.46 kg/m2  Physical Examination: General: Well developed, well nourished, NAD HEENT: OP clear, mucus membranes moist SKIN: warm, dry. No rashes. Neuro: No focal deficits Musculoskeletal: Muscle strength 5/5 all ext Psychiatric: Mood and affect normal Neck: No JVD, no carotid bruits, no thyromegaly, no lymphadenopathy. Lungs:Clear bilaterally, no wheezes, rhonci, crackles Cardiovascular: Regular rate and rhythm. No murmurs, gallops or rubs. Abdomen:Soft. Bowel sounds present. Non-tender.  Extremities: No lower extremity edema. Pulses are 2 + in the bilateral DP/PT.  Assessment and Plan:   1. Coronary Artery Disease: Stable. Continue ASA and beta blocker. Change Crestor to Pravastatin. Will stop  Effient and start Plavix 75 mg po Qdaily. Check lipids and LFTs in 12 weeks. Stop fenofibrate, Zetia and fish oil. (she is asking to simplify medications).   3. Hypertension: Controlled but on the low side. Will lower metoprolol to 12.5 mg po BID.

## 2013-06-19 ENCOUNTER — Telehealth: Payer: Self-pay | Admitting: Internal Medicine

## 2013-06-19 NOTE — Telephone Encounter (Signed)
Lateral chest x-ray is normal and the pneumonia cleared up. She is to followup with Rikki Spearing as previously instructed in the office visit  Dr. Kalman Shan, M.D., [facility]C.P Pulmonary and Critical Care Medicine Staff Physician [facility] [place] [facility] Pager: [phone], If no answer or between  15:00h - 7:00h: call [phone]  06/19/2013 11:52 AM   \

## 2013-06-19 NOTE — Assessment & Plan Note (Signed)
She appears to have post COPD exacerbation and posterior left lower pneumonia and fatigue. This will take some time to recover. Acid to complete the Levaquin and prednisone course given by her primary care physician. I will get a chest x-ray today to ensure the left lower lobe infiltrate is resolving because if it is not we'll get a CT scan of the chest. Will have her followup with her nurse practitioner in a few weeks for medicine calendar and general review  She and her daughter I agreeable with the plan  > 50% of this > 25 min visit spent in face to face counseling (15 min visit converted to 25 min)

## 2013-06-20 NOTE — Telephone Encounter (Signed)
[id number] with pt niece, Ms. [name]. Carron Curie, CMA

## 2013-06-25 NOTE — Telephone Encounter (Signed)
Returning call can be reached at [phone].[name]

## 2013-06-25 NOTE — Telephone Encounter (Signed)
Ms. Brynda Peon awarr of results. Nothing further needed

## 2013-07-03 ENCOUNTER — Ambulatory Visit: Payer: Medicare Other | Admitting: Adult Health

## 2013-07-10 ENCOUNTER — Encounter: Payer: Self-pay | Admitting: Adult Health

## 2013-07-10 ENCOUNTER — Ambulatory Visit (INDEPENDENT_AMBULATORY_CARE_PROVIDER_SITE_OTHER): Payer: Medicare Other | Admitting: Adult Health

## 2013-07-10 VITALS — BP 118/60 | HR 90 | Temp 97.9°F | Ht 65.0 in | Wt 142.0 lb

## 2013-07-10 DIAGNOSIS — J441 Chronic obstructive pulmonary disease with (acute) exacerbation: Secondary | ICD-10-CM

## 2013-07-10 MED ORDER — HYDROCODONE-HOMATROPINE 5-1.5 MG/5ML PO SYRP: 5.0000 mL | ORAL_SOLUTION | Freq: Four times a day (QID) | ORAL | Status: DC | PRN

## 2013-07-10 NOTE — Progress Notes (Signed)
Subjective:    Patient ID: [name], female    DOB: 1941-08-22, 72 y.o.   MRN: [id number]  HPI Problem List 1. Ex Heavy Smoker 2. 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 Refuses spiriva, flu shot, rehab 3. AECOPD hx - Aug 2011  (office Rx)   OV 09/20/11: FU for above. Last visit over a year ago on 08/01/2010. Doing well. Though admitted on ROS she has chest tightness, dyspnea and sinus issues to CMA, she is denying this to me. Feels well. AGain, refused rehab and flu shot. No new complains  REC Clinically copd appears stable Continue advair - nurse will ensure that you have enough refillls Return in 9 month for followup I reespect your decision to refuse flu shot Next visit, will check walking exertion, alpha 1 and spirometry  OV 06/12/2013    Admitted earlier in [place] for 2 days with worsening cough and yellow sputum for  1 months and dx with LL PNAand AECOPD. Dc on abx and pred taper and improved but cough worsened again. 3 days ago startred on levaquin and pred taper (few week taper) by [name], MD. Feeling fatigued overall. COPD CAT score is 29. Can make bed and overall fatigued    CAT COPD Symptom & Quality of Life Score (GSK trademark) 0 is no burden. 5 is highest burden 06/12/2013   Never Cough -> Cough all the time 4  No phlegm in chest -> Chest is full of phlegm 3  No chest tightness -> Chest feels very tight 2  No dyspnea for 1 flight stairs/hill -> Very dyspneic for 1 flight of stairs 4  No limitations for ADL at home -> Very limited with ADL at home 3  Confident leaving home -> Not at all confident leaving home 3  Sleep soundly -> Do not sleep soundly because of lung condition 5  Lots of Energy -> No energy at all 5  TOTAL Score (max 40)  29    07/10/2013 Follow up  [name], after a left lower lobe pneumonia. She finished her antibiotics with Levaquin.  Chest x-ray showed a clearance of her left lower lobe infiltrate. Patient states that she is improved with decreased shortness, of breath, and congestion. However, continues to have a persistent dry cough. She is requesting a cough syrup. She is taking Mucinex DM without much relief. She is also use Tessalon the past, but has been intolerant to this. Patient denies any hemoptysis, orthopnea, PND, or increased leg swelling. Unfortunately, patient continues to smoke. Smoking cessation was discussed in detail.  CAT score 26 today .  Review of Systems  Constitutional: Negative for fever and unexpected weight change.  HENT: Positive for congestion and postnasal drip. Negative for ear pain, nosebleeds, sore throat, rhinorrhea, sneezing, trouble swallowing, dental problem and sinus pressure.   Eyes: Negative for redness and itching.  Respiratory: Positive for cough,  Cardiovascular: Negative for palpitations and leg swelling.  Gastrointestinal: Negative for nausea and vomiting.  Genitourinary: Negative for dysuria.  Musculoskeletal: Negative for joint swelling.  Skin: Negative for rash.  Neurological: Negative for headaches.  Hematological: Does not bruise/bleed easily.  Psychiatric/Behavioral: Negative for dysphoric mood. The patient is not nervous/anxious.        Objective:   GEN: A/Ox3; pleasant , NAD, elderly female   HEENT:  [place]/AT,  EACs-clear, TMs-wnl, NOSE-clear, THROAT-clear, no lesions, no postnasal drip or  exudate noted.   NECK:  Supple w/ fair ROM; no JVD; normal carotid impulses w/o bruits; no thyromegaly or nodules palpated; no lymphadenopathy.  RESP  Few scattered rhonchi no accessory muscle use, no dullness to percussion  CARD:  RRR, no m/r/g  , no peripheral edema, pulses intact, no cyanosis or clubbing.  GI:   Soft & nt; nml bowel sounds; no organomegaly or masses detected.  Musco: Warm bil, no deformities or joint swelling noted.   Neuro: alert, no focal deficits  noted.    Skin: Warm, no lesions or rashes        Assessment & Plan:

## 2013-07-10 NOTE — Patient Instructions (Addendum)
Must quit smoking .  Mucinex DM Twice daily  As needed  Cough/congestion  Continue on Dulera 2 puffs Twice daily   May use Hydromet 1/2-1 tsp every 8hr As needed  Cough , may make you sleepy (do not use with other pain meds )  follow up Dr. [name] in 2-3 months and As needed   Please contact office for sooner follow up if symptoms do not improve or worsen or seek emergency care

## 2013-07-10 NOTE — Assessment & Plan Note (Signed)
Resolving exacerbation, status post left lower lobe pneumonia. Recent chest x-ray showed clearance of the left lower lobe infiltrate. Patient has a persistent cough. Smoking cessation encouraged.   Plan  Must quit smoking .  Mucinex DM Twice daily  As needed  Cough/congestion  Continue on Dulera 2 puffs Twice daily   May use Hydromet 1/2-1 tsp every 8hr As needed  Cough , may make you sleepy (do not use with other pain meds )  follow up Dr. [name] in 2-3 months and As needed   Please contact office for sooner follow up if symptoms do not improve or worsen or seek emergency care

## 2013-07-22 ENCOUNTER — Emergency Department (HOSPITAL_COMMUNITY)
Admission: EM | Admit: 2013-07-22 | Discharge: 2013-07-22 | Disposition: A | Discharge status: Home / Self Care | Payer: Medicare Other | Attending: Emergency Medicine | Admitting: Emergency Medicine

## 2013-07-22 ENCOUNTER — Encounter (HOSPITAL_COMMUNITY): Payer: Self-pay

## 2013-07-22 ENCOUNTER — Emergency Department (HOSPITAL_COMMUNITY): Payer: Medicare Other

## 2013-07-22 DIAGNOSIS — Z794 Long term (current) use of insulin: Secondary | ICD-10-CM | POA: Insufficient documentation

## 2013-07-22 DIAGNOSIS — M1990 Unspecified osteoarthritis, unspecified site: Secondary | ICD-10-CM | POA: Insufficient documentation

## 2013-07-22 DIAGNOSIS — Z7982 Long term (current) use of aspirin: Secondary | ICD-10-CM | POA: Insufficient documentation

## 2013-07-22 DIAGNOSIS — Z87442 Personal history of urinary calculi: Secondary | ICD-10-CM | POA: Insufficient documentation

## 2013-07-22 DIAGNOSIS — H409 Unspecified glaucoma: Secondary | ICD-10-CM | POA: Insufficient documentation

## 2013-07-22 DIAGNOSIS — Z880 Allergy status to penicillin: Secondary | ICD-10-CM | POA: Insufficient documentation

## 2013-07-22 DIAGNOSIS — E785 Hyperlipidemia, unspecified: Secondary | ICD-10-CM | POA: Insufficient documentation

## 2013-07-22 DIAGNOSIS — F411 Generalized anxiety disorder: Secondary | ICD-10-CM | POA: Insufficient documentation

## 2013-07-22 DIAGNOSIS — I10 Essential (primary) hypertension: Secondary | ICD-10-CM | POA: Insufficient documentation

## 2013-07-22 DIAGNOSIS — K219 Gastro-esophageal reflux disease without esophagitis: Secondary | ICD-10-CM | POA: Insufficient documentation

## 2013-07-22 DIAGNOSIS — J441 Chronic obstructive pulmonary disease with (acute) exacerbation: Secondary | ICD-10-CM

## 2013-07-22 DIAGNOSIS — I2510 Atherosclerotic heart disease of native coronary artery without angina pectoris: Secondary | ICD-10-CM | POA: Insufficient documentation

## 2013-07-22 DIAGNOSIS — E119 Type 2 diabetes mellitus without complications: Secondary | ICD-10-CM | POA: Insufficient documentation

## 2013-07-22 DIAGNOSIS — F17200 Nicotine dependence, unspecified, uncomplicated: Secondary | ICD-10-CM | POA: Insufficient documentation

## 2013-07-22 DIAGNOSIS — Z79899 Other long term (current) drug therapy: Secondary | ICD-10-CM | POA: Insufficient documentation

## 2013-07-22 LAB — CBC WITH DIFFERENTIAL/PLATELET
Eosinophils Absolute: 0 10*3/uL (ref 0.0–0.7)
Eosinophils Relative: 1 % (ref 0–5)
Hemoglobin: 12.3 g/dL (ref 12.0–15.0)
Lymphs Abs: 1.9 10*3/uL (ref 0.7–4.0)
MCH: 31.9 pg (ref 26.0–34.0)
MCV: 99.5 fL (ref 78.0–100.0)
Monocytes Absolute: 0.4 10*3/uL (ref 0.1–1.0)
Monocytes Relative: 6 % (ref 3–12)
Platelets: 223 10*3/uL (ref 150–400)
RBC: 3.86 MIL/uL — ABNORMAL LOW (ref 3.87–5.11)

## 2013-07-22 LAB — COMPREHENSIVE METABOLIC PANEL
AST: 21 U/L (ref 0–37)
Albumin: 3.1 g/dL — ABNORMAL LOW (ref 3.5–5.2)
Chloride: 104 mEq/L (ref 96–112)
Creatinine, Ser: 0.66 mg/dL (ref 0.50–1.10)
Potassium: 3.8 mEq/L (ref 3.5–5.1)
Total Bilirubin: 0.2 mg/dL — ABNORMAL LOW (ref 0.3–1.2)

## 2013-07-22 MED ORDER — PREDNISONE 20 MG PO TABS: ORAL_TABLET | ORAL | Status: DC

## 2013-07-22 MED ORDER — LEVOFLOXACIN 500 MG PO TABS: 500.0000 mg | ORAL_TABLET | Freq: Every day | ORAL | Status: DC

## 2013-07-22 NOTE — ED Notes (Signed)
Pt had PNA vaccine in 2010 and has not had the flu vaccine. Pt has an allergy to eggs. Pt will follow up with pulmonologist on Sept 8, in regards to flu vaccine.

## 2013-07-22 NOTE — ED Provider Notes (Signed)
CSN: [id number]     Arrival date & time 07/22/13  1605 History   First MD Initiated Contact with Patient 07/22/13 1746     Chief Complaint  Patient presents with  . Shortness of Breath   (Consider location/radiation/quality/duration/timing/severity/associated sxs/prior Treatment) HPI Patient reports this afternoon she got short of breath. She states she used her inhaler however she felt like she was getting worse. She states she's feeling better now. She states she feels like she panicked. She states her breathing has been getting worse for the past month. She states she had pneumonia in July and was treated with prednisone and antibiotics. She states when the antibiotics and prednisone stopped she started feeling worse and she was put back on prednisone again. She reports she is off the prednisone now. She states her cough is dry, she denies fever although she feels hot. She states sometimes she has rhinorrhea. She denies sore throat, nausea, vomiting or diarrhea. Patient does continue to smoke. She is not on oxygen at home.  PCP Dr Esmond Harps Pulmonary Dr Bonney Aid  Past Medical History  Diagnosis Date  . CAD (coronary artery disease)     s/p inferior STEMI 12/29/10 with rotablator atherectomy RCA 12/31/10 and DES x 2 RCA  . Ischemic cardiomyopathy     EF 45% by cath 12/29/10.   . Diabetes mellitus     type 2  . HTN (hypertension)   . Hyperlipidemia   . Diverticulosis   . Asthma   . COPD (chronic obstructive pulmonary disease)   . Small bowel obstruction     from a sigmoid stricture  . Nephrolithiasis   . Sleep apnea   . Overweight(278.02)   . Osteoarthritis   . GERD (gastroesophageal reflux disease)   . Anxiety   . Glaucoma   . History of colonoscopy   . Memory deficit 04/23/2013   Past Surgical History  Procedure Laterality Date  . Bilateral tubal ligation    . Lithotripsy    . 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 Cardiac surgery     Family  History  Problem Relation Age of Onset  . Breast cancer Maternal Grandmother   . Diabetes Father   . Heart disease Father   . Diabetes Sister   . Colon cancer Neg Hx    History  Substance Use Topics  . Smoking status: Current Every Day Smoker -- 0.50 packs/day for 25 years    Types: Cigarettes    Last Attempt to Quit: 10/20/2004  . Smokeless tobacco: Never Used     Comment: over a ppd for 40+ years; quit 10/2004  . Alcohol Use: 0.5 oz/week    1 drink(s) per week     Comment: rare    Lives at home Lives alone but someone from family is always there No home oxygen   OB History   Grav Para Term Preterm Abortions TAB SAB Ect Mult Living                 Review of Systems  All other systems reviewed and are negative.    Allergies  Lisinopril; Eggs or egg-derived products; Erythromycin; Iodine; Penicillins; Pentazocine lactate; Propoxyphene hcl; Quinapril hcl; and Sulfonamide derivatives  Home Medications   Current Outpatient Rx  Name  Route  Sig  Dispense  Refill  . albuterol (PROVENTIL) (2.5 MG/3ML) 0.083% nebulizer solution   Nebulization   Take 2.5 mg by nebulization every 6 (six) hours as needed for wheezing or  shortness of breath.          Marland Kitchen aspirin 325 MG tablet   Oral   Take 325 mg by mouth daily.         . clopidogrel (PLAVIX) 75 MG tablet   Oral   Take 1 tablet (75 mg total) by mouth daily.   30 tablet   11   . HYDROcodone-homatropine (HYDROMET) 5-1.5 MG/5ML syrup   Oral   Take 5 mLs by mouth every 6 (six) hours as needed for cough.   240 mL   0   . hydrOXYzine (ATARAX/VISTARIL) 25 MG tablet   Oral   Take 12.5-25 mg by mouth every 6 (six) hours as needed for itching.          . insulin aspart (NOVOLOG) 100 unit/ml SOLN   Subcutaneous   Inject 7 Units into the skin 3 (three) times daily with meals.   1 pen   3   . insulin glargine (LANTUS) 100 UNIT/ML injection   Subcutaneous   Inject 0.16 mLs (16 Units total) into the skin at  bedtime.         . JENTADUETO 2.03-999 MG TABS   Oral   Take 1 tablet by mouth 2 (two) times daily.          . metoprolol tartrate (LOPRESSOR) 25 MG tablet   Oral   Take 0.5 tablets (12.5 mg total) by mouth 2 (two) times daily.   30 tablet   11   . mometasone-formoterol (DULERA) 200-5 MCG/ACT AERO   Inhalation   Inhale 2 puffs into the lungs 2 (two) times daily.         . nitroGLYCERIN (NITROSTAT) 0.4 MG SL tablet   Sublingual   Place 1 tablet (0.4 mg total) under the tongue every 5 (five) minutes as needed. For chest pain.   25 tablet   6   . oxyCODONE-acetaminophen (PERCOCET/ROXICET) 5-325 MG per tablet   Oral   Take 1 tablet by mouth every 6 (six) hours as needed for pain.          . pantoprazole (PROTONIX) 40 MG tablet   Oral   Take 1 tablet (40 mg total) by mouth daily at 12 noon.   30 tablet   1   . polyethylene glycol powder (MIRALAX) powder   Oral   Take 17 g by mouth daily.           . pravastatin (PRAVACHOL) 40 MG tablet   Oral   Take 1 tablet (40 mg total) by mouth every evening.   30 tablet   6   . pregabalin (LYRICA) 75 MG capsule   Oral   Take 75 mg by mouth 2 (two) times daily.           . rivastigmine (EXELON) 9.5 mg/24hr   Transdermal   Place 1 patch onto the skin daily.         . timolol (BETIMOL) 0.5 % ophthalmic solution   Right Eye   Place 1 drop into the right eye 2 (two) times daily.           Marland Kitchen tolterodine (DETROL LA) 4 MG 24 hr capsule   Oral   Take 4 mg by mouth daily.           Marland Kitchen LORazepam (ATIVAN) 0.5 MG tablet   Oral   Take 0.5 mg by mouth 2 (two) times daily as needed for anxiety.  BP 149/74  Pulse 79  Temp(Src) 98.2 F (36.8 C) (Oral)  SpO2 97%  Vital signs normal   Physical Exam  Nursing note and vitals reviewed. Constitutional: She is oriented to person, place, and time. She appears well-developed and well-nourished.  Non-toxic appearance. She does not appear ill. No distress.  HENT:   Head: Normocephalic and atraumatic.  Right Ear: External ear normal.  Left Ear: External ear normal.  Nose: Nose normal. No mucosal edema or rhinorrhea.  Mouth/Throat: Oropharynx is clear and moist and mucous membranes are normal. No dental abscesses or edematous.  Eyes: Conjunctivae and EOM are normal. Pupils are equal, round, and reactive to light.  Neck: Normal range of motion and full passive range of motion without pain. Neck supple.  Cardiovascular: Normal rate, regular rhythm and normal heart sounds.  Exam reveals no gallop and no friction rub.   No murmur heard. Pulmonary/Chest: Effort normal and breath sounds normal. No respiratory distress. She has no wheezes. She has no rhonchi. She has no rales. She exhibits no tenderness and no crepitus.  Has a deep rattling cough  Abdominal: Soft. Normal appearance and bowel sounds are normal. She exhibits no distension. There is no tenderness. There is no rebound and no guarding.  Musculoskeletal: Normal range of motion. She exhibits no edema and no tenderness.  Moves all extremities well.   Neurological: She is alert and oriented to person, place, and time. She has normal strength. No cranial nerve deficit.  Skin: Skin is warm, dry and intact. No rash noted. No erythema. No pallor.  Psychiatric: She has a normal mood and affect. Her speech is normal and behavior is normal. Her mood appears not anxious.    ED Course  Procedures (including critical care time)  Pt states she feels better after EMS treatment.  She was seen by Marvis Repress, NP on 8/21 who describes she is to stay on her advair but she states she is on dulera. She wants to stay on chronic prednisone because she feels better while on it. In July she was placed on extended course of levaquin and prednisone  20:17 Dr Sherene Sires, states she can take either advair or dulera, give her enough prednisone so she will still be on it when she comes back to the office. She needs to be seen in the ED  in the next week.   Labs Review  Results for orders placed during the hospital encounter of 07/22/13  COMPREHENSIVE METABOLIC PANEL      Result Value Range   Sodium 141  135 - 145 mEq/L   Potassium 3.8  3.5 - 5.1 mEq/L   Chloride 104  96 - 112 mEq/L   CO2 27  19 - 32 mEq/L   Glucose, Bld 123 (*) 70 - 99 mg/dL   BUN 16  6 - 23 mg/dL   Creatinine, Ser 1.32  0.50 - 1.10 mg/dL   Calcium 8.9  8.4 - 44.0 mg/dL   Total Protein 6.7  6.0 - 8.3 g/dL   Albumin 3.1 (*) 3.5 - 5.2 g/dL   AST 21  0 - 37 U/L   ALT 13  0 - 35 U/L   Alkaline Phosphatase 57  39 - 117 U/L   Total Bilirubin 0.2 (*) 0.3 - 1.2 mg/dL   GFR calc non Af Amer 87 (*) >90 mL/min   GFR calc Af Amer >90  >90 mL/min  CBC WITH DIFFERENTIAL      Result Value Range   WBC 7.1  4.0 - 10.5 K/uL   RBC 3.86 (*) 3.87 - 5.11 MIL/uL   Hemoglobin 12.3  12.0 - 15.0 g/dL   HCT 16.1  09.6 - 04.5 %   MCV 99.5  78.0 - 100.0 fL   MCH 31.9  26.0 - 34.0 pg   MCHC 32.0  30.0 - 36.0 g/dL   RDW 40.9  81.1 - 91.4 %   Platelets 223  150 - 400 K/uL   Neutrophils Relative % 68  43 - 77 %   Neutro Abs 4.8  1.7 - 7.7 K/uL   Lymphocytes Relative 26  12 - 46 %   Lymphs Abs 1.9  0.7 - 4.0 K/uL   Monocytes Relative 6  3 - 12 %   Monocytes Absolute 0.4  0.1 - 1.0 K/uL   Eosinophils Relative 1  0 - 5 %   Eosinophils Absolute 0.0  0.0 - 0.7 K/uL   Basophils Relative 0  0 - 1 %   Basophils Absolute 0.0  0.0 - 0.1 K/uL   Laboratory interpretation all normal   Imaging Review Dg Chest 2 View  07/22/2013   CLINICAL DATA:  72 year old female shortness of Breath chest congestion cough.  EXAM: CHEST  2 VIEW  COMPARISON:  06/12/2013 and earlier.  FINDINGS: Stable large lung volumes. Stable cardiomegaly and mediastinal contours. No pneumothorax or pulmonary edema. Chronic veiling opacity at the left lung base. Cannot exclude small left pleural effusion. No pulmonary edema or confluent pulmonary opacity. Visualized tracheal air column is within normal limits. No  acute osseous abnormality identified. Calcified atherosclerosis of the aorta.  IMPRESSION: Possible small left pleural effusion, but otherwise no acute findings with stable hyperinflation and cardiomegaly.   Electronically Signed   By: Augusto Gamble   On: 07/22/2013 17:22    MDM   1. COPD with exacerbation     New Prescriptions   LEVOFLOXACIN (LEVAQUIN) 500 MG TABLET    Take 1 tablet (500 mg total) by mouth daily.   PREDNISONE (DELTASONE) 20 MG TABLET    Take 3 po QD x 4d , then 2 po QD x 4d then 1 po QD x 4d    Plan discharge   Devoria Albe, MD, Franz Dell, MD 07/22/13 2109

## 2013-07-22 NOTE — ED Notes (Signed)
Per EMS pt c/o increase SOB, on home o2 but use it all the time. Pt in no acute distress

## 2013-07-23 ENCOUNTER — Telehealth: Payer: Self-pay | Admitting: Internal Medicine

## 2013-07-23 NOTE — Telephone Encounter (Signed)
Message copied by Antionette Fairy on Wed Jul 23, 2013 11:47 AM ------      Message from: Devoria Albe      Created: Tue Jul 22, 2013  8:27 PM      Regarding: Order for [facility]             Patient Name: [name], [name]([id number])      Sex: Female      DOB: 14-Dec-1940              PCP: Laurann Montana S        Center: [facility]             Types of orders made on 07/22/2013: Consult, Imaging, Lab, Medications, Nursing            Order Date:07/22/2013      Ordering User:KNAPP, IVA [[id number]]      Attending Provider:Iva Hildred Laser, MD [phone]      Authorizing Provider: Ward Givens, MD [[id number]]      Department:[facility]-EMERGENCY DEPT[[id number]]            Order Specific Information      Order: COPD clinic follow up [Custom: CON4000]  Order #: [id number]  Qty: 1        Priority: Routine  Class: Hospital Performed          Where does the patient receive follow up COPD care -> [place]                 Follow up in -> 5-7 days               Released on: 07/22/2013  8:27 PM                    Priority: Routine  Class: Hospital Performed          Where does the patient receive follow up COPD care -> [place]                 Follow up in -> 5-7 days               Released on: 07/22/2013  8:27 PM       ------

## 2013-07-28 ENCOUNTER — Encounter: Payer: Self-pay | Admitting: Internal Medicine

## 2013-07-28 ENCOUNTER — Ambulatory Visit (INDEPENDENT_AMBULATORY_CARE_PROVIDER_SITE_OTHER): Payer: Medicare Other | Admitting: Internal Medicine

## 2013-07-28 VITALS — BP 124/80 | HR 85 | Temp 98.0°F | Ht 66.0 in | Wt 148.0 lb

## 2013-07-28 DIAGNOSIS — R053 Chronic cough: Secondary | ICD-10-CM

## 2013-07-28 DIAGNOSIS — J9600 Acute respiratory failure, unspecified whether with hypoxia or hypercapnia: Secondary | ICD-10-CM

## 2013-07-28 DIAGNOSIS — R059 Cough, unspecified: Secondary | ICD-10-CM

## 2013-07-28 DIAGNOSIS — R05 Cough: Secondary | ICD-10-CM

## 2013-07-28 NOTE — Patient Instructions (Addendum)
Unclear why still so symptomatic and not better Please do CT chest; will call with results Followup depending on CT

## 2013-07-28 NOTE — Progress Notes (Signed)
Subjective:    Patient ID: [name], female    DOB: [birth year], 72 y.o.   MRN: [id number]  HPI Problem List 1. Ex Heavy Smoker 2. 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 Refuses spiriva, flu shot, rehab 3. AECOPD hx - Aug 2011  (office Rx)   OV 09/20/11: FU for above. Last visit over a year ago on 08/01/2010. Doing well. Though admitted on ROS she has chest tightness, dyspnea and sinus issues to CMA, she is denying this to me. Feels well. AGain, refused rehab and flu shot. No new complains  REC Clinically copd appears stable Continue advair - nurse will ensure that you have enough refillls Return in 9 month for followup I reespect your decision to refuse flu shot Next visit, will check walking exertion, alpha 1 and spirometry  OV 06/12/2013    Admitted earlier in [place] for 2 days with worsening cough and yellow sputum for  1 months and dx with LL PNAand AECOPD. Dc on abx and pred taper and improved but cough worsened again. 3 days ago startred on levaquin and pred taper (few week taper) by [name], MD. Feeling fatigued overall. COPD CAT score is 29. Can make bed and overall fatigued  REC Have CXR today; will call with result. - > CLEARED Finish levaquin and prednisone ordered by [name], MD Return to see me or NP [name]  in 3-4 weeks with COPD CAT Score at followup  07/10/2013 Follow up   [name], after a left lower lobe pneumonia. She finished her antibiotics with Levaquin. Chest x-ray showed a clearance of her left lower lobe infiltrate. Patient states that she is improved with decreased shortness, of breath, and congestion. However, continues to have a persistent dry cough. She is requesting a cough syrup. She is taking Mucinex DM without much relief. She is also use Tessalon the past, but has been intolerant to this. Patient denies any hemoptysis, orthopnea, PND,  or increased leg swelling. Unfortunately, patient continues to smoke. Smoking cessation was discussed in detail.  CAT score 26 today .   REC Must quit smoking .  Mucinex DM Twice daily As needed Cough/congestion  Continue on Dulera 2 puffs Twice daily  May use Hydromet 1/2-1 tsp every 8hr As needed Cough , may make you sleepy (do not use with other pain meds )  follow up Dr. [name] in 2-3 months and As needed  Please contact office for sooner follow up if symptoms do not improve or worsen or seek emergency care      OV 07/28/2013 Had acute  shortness of breath and was taken to the emergency department 07/22/2013. Blood work reviewed and showed normal chemistry and normal CBC. Is no troponin check. A chest x-ray showed possible left pleural effusion but otherwise hyperinflated chest. However to me the chest x-ray shows either a small effusion or left lower lobe atelectasis which is similar to July 2014 and improved from the one prior to that and she had a pneumonia diagnoses in early July 2014. I do note that she's never had a CT scan of the chest. Patient was discharged 10 days of Levaquin and a prednisone taper which he still on. SHe continues to be symptomatiuc wit CAT Scoire 26   Of note she continues to smoke    CAT COPD Symptom & Quality of Life Score (GSK trademark) 0 is no burden.  5 is highest burden 06/12/2013  07/28/2013   Never Cough -> Cough all the time 4 5  No phlegm in chest -> Chest is full of phlegm 3 2  No chest tightness -> Chest feels very tight 2 0  No dyspnea for 1 flight stairs/hill -> Very dyspneic for 1 flight of stairs 4 5  No limitations for ADL at home -> Very limited with ADL at home 3 3  Confident leaving home -> Not at all confident leaving home 3 2  Sleep soundly -> Do not sleep soundly because of lung condition 5 4  Lots of Energy -> No energy at all 5 5  TOTAL Score (max 40)  29 26       Review of Systems  Constitutional: Negative for fever and  unexpected weight change.  HENT: Negative for ear pain, nosebleeds, congestion, sore throat, rhinorrhea, sneezing, trouble swallowing, dental problem, postnasal drip and sinus pressure.   Eyes: Negative for redness and itching.  Respiratory: Positive for cough and shortness of breath. Negative for chest tightness and wheezing.   Cardiovascular: Negative for palpitations and leg swelling.  Gastrointestinal: Negative for nausea and vomiting.  Genitourinary: Negative for dysuria.  Musculoskeletal: Negative for joint swelling.  Skin: Negative for rash.  Neurological: Negative for headaches.  Hematological: Does not bruise/bleed easily.  Psychiatric/Behavioral: Negative for dysphoric mood. The patient is not nervous/anxious.        Objective:   Physical Exam GEN: A/Ox3; pleasant , NAD, elderly female   HEENT:  [place]/AT,  EACs-clear, TMs-wnl, NOSE-clear, THROAT-clear, no lesions, no postnasal drip or exudate noted.   NECK:  Supple w/ fair ROM; no JVD; normal carotid impulses w/o bruits; no thyromegaly or nodules palpated; no lymphadenopathy.  RESP  Few scattered rhonchi no accessory muscle use, no dullness to percussion  CARD:  RRR, no m/r/g  , no peripheral edema, pulses intact, no cyanosis or clubbing.  GI:   Soft & nt; nml bowel sounds; no organomegaly or masses detected.  Musco: Warm bil, no deformities or joint swelling noted.   Neuro: alert, no focal deficits noted.    Skin: Warm, no lesions or rashes        Assessment & Plan:

## 2013-07-29 NOTE — Assessment & Plan Note (Signed)
Unclear why she seems to have recurrent respiratory infectious exacerbations. The pathology in the left lower lobe on chest x-ray but seems to wax and wane it is concerning. Chest x-ray is insensitive and therefore I will get a CT scan of the chest PT and her granddaughter are agreeable for the plan. Followup based on CT scan of the chest

## 2013-07-31 ENCOUNTER — Ambulatory Visit (INDEPENDENT_AMBULATORY_CARE_PROVIDER_SITE_OTHER)
Admission: RE | Admit: 2013-07-31 | Discharge: 2013-07-31 | Disposition: A | Discharge status: Home / Self Care | Payer: Medicare Other | Source: Ambulatory Visit | Attending: Internal Medicine | Admitting: Internal Medicine

## 2013-07-31 DIAGNOSIS — R05 Cough: Secondary | ICD-10-CM

## 2013-07-31 DIAGNOSIS — R059 Cough, unspecified: Secondary | ICD-10-CM

## 2013-07-31 DIAGNOSIS — R053 Chronic cough: Secondary | ICD-10-CM

## 2013-08-06 ENCOUNTER — Telehealth: Payer: Self-pay | Admitting: Internal Medicine

## 2013-08-06 NOTE — Telephone Encounter (Signed)
CT chest shows resolving RLL pneumonia. Let the dtr/grand dtr and patient know that there is no fluid or nothing wrong on left side. I am thinking she is deconditioned and will benefit from home PT. IF not present, order it  She should come back to see TP in < 2 weeks; and at that time if still symptomatic might be worthwhile going to cards  Anytime she is worse or has chest pain : go  To cards  Dr. Kalman Shan, M.D., [facility] [place].C.P Pulmonary and Critical Care Medicine Staff Physician [facility] [place] [facility]monary and Critical Care Pager: [phone], If no answer or between  15:00h - 7:00h: call [phone]  08/06/2013 5:48 AM

## 2013-08-07 NOTE — Telephone Encounter (Signed)
Pt grand daughter is aware and appt is set. Carron Curie, CMA

## 2013-08-11 ENCOUNTER — Emergency Department (HOSPITAL_COMMUNITY)
Admission: EM | Admit: 2013-08-11 | Discharge: 2013-08-11 | Disposition: A | Discharge status: Home / Self Care | Payer: Medicare Other | Attending: Emergency Medicine | Admitting: Emergency Medicine

## 2013-08-11 ENCOUNTER — Emergency Department (HOSPITAL_COMMUNITY): Payer: Medicare Other

## 2013-08-11 ENCOUNTER — Encounter (HOSPITAL_COMMUNITY): Payer: Self-pay | Admitting: *Deleted

## 2013-08-11 DIAGNOSIS — Z8739 Personal history of other diseases of the musculoskeletal system and connective tissue: Secondary | ICD-10-CM | POA: Insufficient documentation

## 2013-08-11 DIAGNOSIS — J449 Chronic obstructive pulmonary disease, unspecified: Secondary | ICD-10-CM | POA: Insufficient documentation

## 2013-08-11 DIAGNOSIS — K219 Gastro-esophageal reflux disease without esophagitis: Secondary | ICD-10-CM | POA: Insufficient documentation

## 2013-08-11 DIAGNOSIS — F17200 Nicotine dependence, unspecified, uncomplicated: Secondary | ICD-10-CM | POA: Insufficient documentation

## 2013-08-11 DIAGNOSIS — R059 Cough, unspecified: Secondary | ICD-10-CM | POA: Insufficient documentation

## 2013-08-11 DIAGNOSIS — Z8669 Personal history of other diseases of the nervous system and sense organs: Secondary | ICD-10-CM | POA: Insufficient documentation

## 2013-08-11 DIAGNOSIS — I2510 Atherosclerotic heart disease of native coronary artery without angina pectoris: Secondary | ICD-10-CM | POA: Insufficient documentation

## 2013-08-11 DIAGNOSIS — Z8719 Personal history of other diseases of the digestive system: Secondary | ICD-10-CM | POA: Insufficient documentation

## 2013-08-11 DIAGNOSIS — I10 Essential (primary) hypertension: Secondary | ICD-10-CM | POA: Insufficient documentation

## 2013-08-11 DIAGNOSIS — Z7982 Long term (current) use of aspirin: Secondary | ICD-10-CM | POA: Insufficient documentation

## 2013-08-11 DIAGNOSIS — J45909 Unspecified asthma, uncomplicated: Secondary | ICD-10-CM | POA: Insufficient documentation

## 2013-08-11 DIAGNOSIS — Z794 Long term (current) use of insulin: Secondary | ICD-10-CM | POA: Insufficient documentation

## 2013-08-11 DIAGNOSIS — F411 Generalized anxiety disorder: Secondary | ICD-10-CM | POA: Insufficient documentation

## 2013-08-11 DIAGNOSIS — E785 Hyperlipidemia, unspecified: Secondary | ICD-10-CM | POA: Insufficient documentation

## 2013-08-11 DIAGNOSIS — J4489 Other specified chronic obstructive pulmonary disease: Secondary | ICD-10-CM | POA: Insufficient documentation

## 2013-08-11 DIAGNOSIS — Z8679 Personal history of other diseases of the circulatory system: Secondary | ICD-10-CM | POA: Insufficient documentation

## 2013-08-11 DIAGNOSIS — Z79899 Other long term (current) drug therapy: Secondary | ICD-10-CM | POA: Insufficient documentation

## 2013-08-11 DIAGNOSIS — E663 Overweight: Secondary | ICD-10-CM | POA: Insufficient documentation

## 2013-08-11 DIAGNOSIS — R05 Cough: Secondary | ICD-10-CM

## 2013-08-11 LAB — URINALYSIS, ROUTINE W REFLEX MICROSCOPIC
Bilirubin Urine: NEGATIVE
Glucose, UA: NEGATIVE mg/dL
Hgb urine dipstick: NEGATIVE
Ketones, ur: NEGATIVE mg/dL
Nitrite: NEGATIVE
Protein, ur: NEGATIVE mg/dL
Specific Gravity, Urine: 1.025 (ref 1.005–1.030)
Urobilinogen, UA: 1 mg/dL (ref 0.0–1.0)
pH: 5.5 (ref 5.0–8.0)

## 2013-08-11 LAB — BASIC METABOLIC PANEL
BUN: 14 mg/dL (ref 6–23)
CO2: 23 mEq/L (ref 19–32)
Calcium: 9.2 mg/dL (ref 8.4–10.5)
Chloride: 105 mEq/L (ref 96–112)
Creatinine, Ser: 0.54 mg/dL (ref 0.50–1.10)
GFR calc Af Amer: >90 mL/min (ref >90)
GFR calc non Af Amer: >90 mL/min (ref >90)
Glucose, Bld: 133 mg/dL — ABNORMAL HIGH (ref 70–99)
Potassium: 4.1 mEq/L (ref 3.5–5.1)
Sodium: 140 mEq/L (ref 135–145)

## 2013-08-11 LAB — CBC
HCT: 43.6 % (ref 36.0–46.0)
Hemoglobin: 14.5 g/dL (ref 12.0–15.0)
MCH: 32.2 pg (ref 26.0–34.0)
MCHC: 33.3 g/dL (ref 30.0–36.0)
MCV: 96.7 fL (ref 78.0–100.0)
Platelets: 109 10*3/uL — ABNORMAL LOW (ref 150–400)
RBC: 4.51 MIL/uL (ref 3.87–5.11)
RDW: 13.8 % (ref 11.5–15.5)
WBC: 9.2 10*3/uL (ref 4.0–10.5)

## 2013-08-11 LAB — URINE MICROSCOPIC-ADD ON

## 2013-08-11 MED ORDER — PROMETHAZINE-DM 6.25-15 MG/5ML PO SYRP: 5.0000 mL | ORAL_SOLUTION | ORAL | Status: DC | PRN

## 2013-08-11 MED ORDER — GUAIFENESIN ER 1200 MG PO TB12: 1.0000 | ORAL_TABLET | Freq: Two times a day (BID) | ORAL | Status: DC

## 2013-08-11 MED ORDER — ALBUTEROL SULFATE (5 MG/ML) 0.5% IN NEBU
5.0000 mg | INHALATION_SOLUTION | Freq: Once | RESPIRATORY_TRACT | Status: AC
  Administered 2013-08-11: 5 mg via RESPIRATORY_TRACT
  Filled 2013-08-11: qty 1

## 2013-08-11 MED ORDER — IPRATROPIUM BROMIDE 0.02 % IN SOLN
0.5000 mg | RESPIRATORY_TRACT | Status: AC
  Administered 2013-08-11: 0.5 mg via RESPIRATORY_TRACT
  Filled 2013-08-11: qty 2.5

## 2013-08-11 NOTE — ED Notes (Signed)
Pt O2 sats while ambulating with staff is 94-92% at room air

## 2013-08-11 NOTE — ED Notes (Signed)
Respiratory called for breathing tx

## 2013-08-11 NOTE — ED Notes (Signed)
Pt reports getting over pneumonia. Today SOB increased, 93% on room air. Denies pain. Congested cough, denies productive cough.

## 2013-08-11 NOTE — ED Provider Notes (Signed)
CSN: [id number]     Arrival date & time 08/11/13  1701 History   First MD Initiated Contact with Patient 08/11/13 1757     Chief Complaint  Patient presents with  . Shortness of Breath   (Consider location/radiation/quality/duration/timing/severity/associated sxs/prior Treatment) HPI Patient presents emergency department for cough and mild shortness of breath.  Patient, states, that she's had increasing cough for the last.  Patient, states, that she has not had significant shortness of breath, like her previous episodes of COPD the patient denies chest pain, nausea, vomiting, abdominal pain, headache, blurred vision, weakness, numbness, dizziness, fever, back pain, sore throat, or diarrhea.  Patient, states she did not take any other medications prior to arrival in her prescribed medications Past Medical History  Diagnosis Date  . CAD (coronary artery disease)     s/p inferior STEMI 12/29/10 with rotablator atherectomy RCA 12/31/10 and DES x 2 RCA  . Ischemic cardiomyopathy     EF 45% by cath 12/29/10.   . Diabetes mellitus     type 2  . HTN (hypertension)   . Hyperlipidemia   . Diverticulosis   . Asthma   . COPD (chronic obstructive pulmonary disease)   . Small bowel obstruction     from a sigmoid stricture  . Nephrolithiasis   . Sleep apnea   . Overweight(278.02)   . Osteoarthritis   . GERD (gastroesophageal reflux disease)   . Anxiety   . Glaucoma   . History of colonoscopy   . Memory deficit 04/23/2013   Past Surgical History  Procedure Laterality Date  . Bilateral tubal ligation    . Lithotripsy    . 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 Cardiac surgery     Family History  Problem Relation Age of Onset  . Breast cancer Maternal Grandmother   . Diabetes Father   . Heart disease Father   . Diabetes Sister   . Colon cancer Neg Hx    History  Substance Use Topics  . Smoking status: Current Every Day Smoker -- 0.50 packs/day for 25 years     Types: Cigarettes    Last Attempt to Quit: 10/20/2004  . Smokeless tobacco: Never Used     Comment: over a ppd for 40+ years; quit 10/2004  . Alcohol Use: 0.5 oz/week    1 drink(s) per week     Comment: rare    OB History   Grav Para Term Preterm Abortions TAB SAB Ect Mult Living                 Review of Systems All other systems negative except as documented in the HPI. All pertinent positives and negatives as reviewed in the HPI. Allergies  Lisinopril; Eggs or egg-derived products; Erythromycin; Iodine; Penicillins; Pentazocine lactate; Propoxyphene hcl; Quinapril hcl; and Sulfonamide derivatives  Home Medications   Current Outpatient Rx  Name  Route  Sig  Dispense  Refill  . albuterol (PROVENTIL) (2.5 MG/3ML) 0.083% nebulizer solution   Nebulization   Take 2.5 mg by nebulization every 6 (six) hours as needed for wheezing or shortness of breath.          Marland Kitchen aspirin 325 MG tablet   Oral   Take 325 mg by mouth every morning.          . clopidogrel (PLAVIX) 75 MG tablet   Oral   Take 75 mg by mouth every morning.         Marland Kitchen HYDROcodone-homatropine (HYDROMET)  5-1.5 MG/5ML syrup   Oral   Take 5 mLs by mouth every 6 (six) hours as needed for cough.   240 mL   0   . hydrOXYzine (ATARAX/VISTARIL) 25 MG tablet   Oral   Take 12.5-25 mg by mouth every 6 (six) hours as needed for itching.          . insulin aspart (NOVOLOG) 100 unit/ml SOLN   Subcutaneous   Inject 5 Units into the skin 2 (two) times daily.          . insulin glargine (LANTUS) 100 UNIT/ML injection   Subcutaneous   Inject 12 Units into the skin at bedtime.         . JENTADUETO 2.03-999 MG TABS   Oral   Take 1 tablet by mouth 2 (two) times daily.          Marland Kitchen LORazepam (ATIVAN) 0.5 MG tablet   Oral   Take 0.5 mg by mouth 2 (two) times daily as needed for anxiety.         . metoprolol tartrate (LOPRESSOR) 25 MG tablet   Oral   Take 0.5 tablets (12.5 mg total) by mouth 2 (two) times  daily.   30 tablet   11   . mometasone-formoterol (DULERA) 200-5 MCG/ACT AERO   Inhalation   Inhale 2 puffs into the lungs 2 (two) times daily.         . nitroGLYCERIN (NITROSTAT) 0.4 MG SL tablet   Sublingual   Place 1 tablet (0.4 mg total) under the tongue every 5 (five) minutes as needed. For chest pain.   25 tablet   6   . oxyCODONE-acetaminophen (PERCOCET/ROXICET) 5-325 MG per tablet   Oral   Take 1 tablet by mouth every 6 (six) hours as needed for pain.          . pantoprazole (PROTONIX) 40 MG tablet   Oral   Take 40 mg by mouth daily as needed (For heartburn or acid reflux.).         Marland Kitchen polyethylene glycol powder (MIRALAX) powder   Oral   Take 17 g by mouth daily as needed (For constipation.).          Marland Kitchen pravastatin (PRAVACHOL) 40 MG tablet   Oral   Take 1 tablet (40 mg total) by mouth every evening.   30 tablet   6   . pregabalin (LYRICA) 75 MG capsule   Oral   Take 75 mg by mouth 2 (two) times daily.           . rivastigmine (EXELON) 9.5 mg/24hr   Transdermal   Place 1 patch onto the skin every morning.          . timolol (BETIMOL) 0.5 % ophthalmic solution   Right Eye   Place 1 drop into the right eye 2 (two) times daily.           Marland Kitchen tolterodine (DETROL LA) 4 MG 24 hr capsule   Oral   Take 4 mg by mouth every morning.          . predniSONE (DELTASONE) 20 MG tablet      Take 3 po QD x 4d , then 2 po QD x 4d then 1 po QD x 4d   24 tablet   0    BP 147/62  Pulse 104  Temp(Src) 98.7 F (37.1 C) (Oral)  Resp 20  SpO2 92% Physical Exam  Nursing note and vitals reviewed. Constitutional: She is  oriented to person, place, and time. She appears well-developed and well-nourished.  HENT:  Head: Normocephalic and atraumatic.  Mouth/Throat: Oropharynx is clear and moist.  Eyes: Pupils are equal, round, and reactive to light.  Neck: Normal range of motion. Neck supple.  Cardiovascular: Normal rate and regular rhythm.  Exam reveals no  gallop and no friction rub.   No murmur heard. Pulmonary/Chest: Effort normal. No accessory muscle usage. No apnea, not tachypneic and not bradypneic. No respiratory distress. She has no decreased breath sounds. She has no wheezes. She has rhonchi. She has no rales.  Neurological: She is alert and oriented to person, place, and time. She exhibits normal muscle tone. Coordination normal.  Skin: Skin is warm and dry. No rash noted.    ED Course  Procedures (including critical care time) Labs Review Labs Reviewed  CBC - Abnormal; Notable for the following:    Platelets 109 (*)    All other components within normal limits  BASIC METABOLIC PANEL - Abnormal; Notable for the following:    Glucose, Bld 133 (*)    All other components within normal limits  URINALYSIS, ROUTINE W REFLEX MICROSCOPIC - Abnormal; Notable for the following:    Leukocytes, UA SMALL (*)    All other components within normal limits  URINE MICROSCOPIC-ADD ON - Abnormal; Notable for the following:    Squamous Epithelial / LPF FEW (*)    All other components within normal limits   Imaging Review Dg Chest 2 View  08/11/2013   CLINICAL DATA:  Cough and congestion.  EXAM: CHEST  2 VIEW  COMPARISON:  Radiographs 07/22/2013. CT 07/31/2013.  FINDINGS: The heart size and mediastinal contours are stable. There is diffuse atherosclerosis of the aorta and coronary arteries. There is chronic blunting of the left costophrenic angle which appears unchanged. No significant residual right basilar airspace disease is identified. There is no edema. The osseous structures appear unchanged.  IMPRESSION: Interval clearing of right basilar airspace disease. No acute findings identified.   Electronically Signed   By: Roxy Horseman   On: 08/11/2013 19:02   Patient is feeling better after a breathing treatment.  She states that she would like to go home and follow up with her Dr. for recheck.  The patient was seen by Dr. Rhunette Croft. MDM   MDM Reviewed: vitals, nursing note and previous chart Reviewed previous: labs and x-ray Interpretation: labs and x-ray        Carlyle Dolly, PA-C 08/18/13 1603  Medical screening examination/treatment/procedure(s) were conducted as a shared visit with non-physician practitioner(s) and myself.  I personally evaluated the patient during the encounter  Pt comes in w/ dib. Hx of COPD, also mild CHF. Pt's sx more consistent with COPD. Mild dyspnea on exam with wheezing. No JVD. Pt given treatments, and feels better.  Derwood Kaplan, MD 08/25/13 [phone]

## 2013-09-09 ENCOUNTER — Encounter: Payer: Self-pay | Admitting: Internal Medicine

## 2013-09-09 ENCOUNTER — Ambulatory Visit (INDEPENDENT_AMBULATORY_CARE_PROVIDER_SITE_OTHER): Payer: Medicare Other | Admitting: Internal Medicine

## 2013-09-09 VITALS — BP 132/68 | HR 90 | Temp 97.7°F | Ht 65.0 in | Wt 149.8 lb

## 2013-09-09 DIAGNOSIS — R05 Cough: Secondary | ICD-10-CM

## 2013-09-09 DIAGNOSIS — R918 Other nonspecific abnormal finding of lung field: Secondary | ICD-10-CM

## 2013-09-09 DIAGNOSIS — R053 Chronic cough: Secondary | ICD-10-CM

## 2013-09-09 DIAGNOSIS — R059 Cough, unspecified: Secondary | ICD-10-CM

## 2013-09-09 NOTE — Progress Notes (Signed)
Subjective:    Patient ID: [name], female    DOB: 1941/03/03, 72 y.o.   MRN: [id number]  HPI  Problem List 1. Ex Heavy Smoker 2. 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 Refuses spiriva, flu shot, rehab - In 2014: MDI is dulera 3. AECOPD hx - Aug 2011  (office Rx)   OV 06/12/2013    Admitted earlier in [facility] for 2 days with worsening cough and yellow sputum for  1 months and dx with LL PNAand AECOPD. Dc on abx and pred taper and improved but cough worsened again. 3 days ago startred on levaquin and pred taper (few week taper) by [name], MD. Feeling fatigued overall. COPD CAT score is 29. Can make bed and overall fatigued  REC Have CXR today; will call with result. - > CLEARED Finish levaquin and prednisone ordered by [name], MD Return to see me or NP [name]  in 3-4 weeks with COPD CAT Score at followup  07/10/2013 Follow up   [name], after a left lower lobe pneumonia. She finished her antibiotics with Levaquin. Chest x-ray showed a clearance of her left lower lobe infiltrate. Patient states that she is improved with decreased shortness, of breath, and congestion. However, continues to have a persistent dry cough. She is requesting a cough syrup. She is taking Mucinex DM without much relief. She is also use Tessalon the past, but has been intolerant to this. Patient denies any hemoptysis, orthopnea, PND, or increased leg swelling. Unfortunately, patient continues to smoke. Smoking cessation was discussed in detail.  CAT score 26 today .   REC Must quit smoking .  Mucinex DM Twice daily As needed Cough/congestion  Continue on Dulera 2 puffs Twice daily  May use Hydromet 1/2-1 tsp every 8hr As needed Cough , may make you sleepy (do not use with other pain meds )  follow up Dr. [name] in 2-3 months and As needed  Please contact office for  sooner follow up if symptoms do not improve or worsen or seek emergency care      OV 09/09/2013 Had acute  shortness of breath and was taken to the emergency department 07/22/2013. Blood work reviewed and showed normal chemistry and normal CBC. Is no troponin check. A chest x-ray showed possible left pleural effusion but otherwise hyperinflated chest. However to me the chest x-ray shows either a small effusion or left lower lobe atelectasis which is similar to July 2014 and improved from the one prior to that and she had a pneumonia diagnoses in early July 2014. I do note that she's never had a CT scan of the chest. Patient was discharged 10 days of Levaquin and a prednisone taper which he still on. SHe continues to be symptomatiuc wit CAT Scoire 26   Of note she continues to smoke   OV 09/09/2013  She presents with her grand daughter for followup. She had CT scan of the chest in 07/31/2013 that shows only right lower lobe resolving pneumonia. She still continues to have severe cough. There is a baseline element to it with periodic exacerbations. [name] is completely. Cough is most dry. There is associated sinus drainage and ticklish sensation in the throat and she does not treat this. There is associated acid reflux. She is noted to be on beta blocker eyedrops but the granddaughter  says she's not compliant with this and does not think this is making her cough worse. Is only an occasional gag  Otherwise COPD is at baseline     CAT COPD Symptom & Quality of Life Score (GSK trademark) 0 is no burden. 5 is highest burden 06/12/2013  09/09/2013   Never Cough -> Cough all the time 4 5  No phlegm in chest -> Chest is full of phlegm 3 2  No chest tightness -> Chest feels very tight 2 0  No dyspnea for 1 flight stairs/hill -> Very dyspneic for 1 flight of stairs 4 5  No limitations for ADL at home -> Very limited with ADL at home 3 3  Confident leaving home  -> Not at all confident leaving home 3 2  Sleep soundly -> Do not sleep soundly because of lung condition 5 4  Lots of Energy -> No energy at all 5 5  TOTAL Score (max 40)  29 26    Past, Family, Social reviewed: no change since last visit   Review of Systems  Constitutional: Negative for fever and unexpected weight change.  HENT: Positive for postnasal drip, rhinorrhea and sore throat. Negative for congestion, dental problem, ear pain, nosebleeds, sinus pressure, sneezing and trouble swallowing.   Eyes: Negative for redness and itching.  Respiratory: Positive for cough, shortness of breath and wheezing. Negative for chest tightness.   Cardiovascular: Negative for palpitations and leg swelling.  Gastrointestinal: Negative for nausea and vomiting.  Genitourinary: Negative for dysuria.  Musculoskeletal: Negative for joint swelling.  Skin: Negative for rash.  Neurological: Negative for headaches.  Hematological: Does not bruise/bleed easily.  Psychiatric/Behavioral: Negative for dysphoric mood. The patient is not nervous/anxious.        Objective:   Physical Exam     Physical Exam GEN: A/Ox3; pleasant , NAD, elderly female   HEENT:  [place]/AT,  EACs-clear, TMs-wnl, NOSE-clear, THROAT-clear, no lesions, no postnasal drip or exudate noted.   NECK:  Supple w/ fair ROM; no JVD; normal carotid impulses w/o bruits; no thyromegaly or nodules palpated; no lymphadenopathy.  RESP  Few scattered rhonchi no accessory muscle use, no dullness to percussion  CARD:  RRR, no m/r/g  , no peripheral edema, pulses intact, no cyanosis or clubbing.  GI:   Soft & nt; nml bowel sounds; no organomegaly or masses detected.  Musco: Warm bil, no deformities or joint swelling noted.   Neuro: alert, no focal deficits noted.    Skin: Warm, no lesions or rashes        Assessment & Plan:

## 2013-09-09 NOTE — Patient Instructions (Addendum)
Cough is multifactorial For sinus   START generic fluticasone inhaler 2 squirts each nostril daily AM  START  nasal 2.7% saline nasal spray 2 squirts each nostril daily at night For acid reflux  START prilosec 20mg  daily AM empthy stomach For airways disease/copd  - contninue dulera and mucinex  - talk to eye doctor and stop timolol  - at followup need to explore reasons for refusing spiriva  - if dulera not working well at following, consider Anoro + QVAR  For resolving pneumonia  - repeat CT chest mid dec 2014 Followup  1 month to see NP or me  dpeending on how cough is, consider switch to anoro +/- neurontin and speech therapy with [name]

## 2013-09-12 ENCOUNTER — Encounter: Payer: Self-pay | Admitting: Internal Medicine

## 2013-09-12 DIAGNOSIS — R05 Cough: Secondary | ICD-10-CM | POA: Insufficient documentation

## 2013-09-12 DIAGNOSIS — R053 Chronic cough: Secondary | ICD-10-CM | POA: Insufficient documentation

## 2013-09-12 NOTE — Assessment & Plan Note (Signed)
Cough is multifactorial For sinus   START generic fluticasone inhaler 2 squirts each nostril daily AM  START  nasal 2.7% saline nasal spray 2 squirts each nostril daily at night For acid reflux  START prilosec 20mg  daily AM empthy stomach For airways disease/copd  - contninue dulera and mucinex  - talk to eye doctor and stop timolol  - at followup need to explore reasons for refusing spiriva  - if dulera not working well at following, consider Anoro + QVAR  For resolving pneumonia  - repeat CT chest mid dec 2014 Followup  1 month to see NP or me  dpeending on how cough is, consider switch to anoro +/- neurontin and speech therapy with [name]   > 50% of this > 25 min visit spent in face to face counseling (15 min visit converted to 25 min)

## 2013-09-16 ENCOUNTER — Other Ambulatory Visit: Payer: Medicare Other

## 2013-09-22 ENCOUNTER — Emergency Department (HOSPITAL_COMMUNITY)
Admission: EM | Admit: 2013-09-22 | Discharge: 2013-09-22 | Disposition: A | Discharge status: Home / Self Care | Payer: Medicare Other | Attending: Emergency Medicine | Admitting: Emergency Medicine

## 2013-09-22 ENCOUNTER — Emergency Department (HOSPITAL_COMMUNITY): Payer: Medicare Other

## 2013-09-22 DIAGNOSIS — Z880 Allergy status to penicillin: Secondary | ICD-10-CM | POA: Insufficient documentation

## 2013-09-22 DIAGNOSIS — M1990 Unspecified osteoarthritis, unspecified site: Secondary | ICD-10-CM | POA: Insufficient documentation

## 2013-09-22 DIAGNOSIS — Z87442 Personal history of urinary calculi: Secondary | ICD-10-CM | POA: Insufficient documentation

## 2013-09-22 DIAGNOSIS — I10 Essential (primary) hypertension: Secondary | ICD-10-CM | POA: Insufficient documentation

## 2013-09-22 DIAGNOSIS — Z8719 Personal history of other diseases of the digestive system: Secondary | ICD-10-CM | POA: Insufficient documentation

## 2013-09-22 DIAGNOSIS — I2510 Atherosclerotic heart disease of native coronary artery without angina pectoris: Secondary | ICD-10-CM | POA: Insufficient documentation

## 2013-09-22 DIAGNOSIS — E119 Type 2 diabetes mellitus without complications: Secondary | ICD-10-CM | POA: Insufficient documentation

## 2013-09-22 DIAGNOSIS — Z7982 Long term (current) use of aspirin: Secondary | ICD-10-CM | POA: Insufficient documentation

## 2013-09-22 DIAGNOSIS — Z8669 Personal history of other diseases of the nervous system and sense organs: Secondary | ICD-10-CM | POA: Insufficient documentation

## 2013-09-22 DIAGNOSIS — Z79899 Other long term (current) drug therapy: Secondary | ICD-10-CM | POA: Insufficient documentation

## 2013-09-22 DIAGNOSIS — E663 Overweight: Secondary | ICD-10-CM | POA: Insufficient documentation

## 2013-09-22 DIAGNOSIS — J441 Chronic obstructive pulmonary disease with (acute) exacerbation: Secondary | ICD-10-CM | POA: Insufficient documentation

## 2013-09-22 DIAGNOSIS — E785 Hyperlipidemia, unspecified: Secondary | ICD-10-CM | POA: Insufficient documentation

## 2013-09-22 DIAGNOSIS — F17200 Nicotine dependence, unspecified, uncomplicated: Secondary | ICD-10-CM | POA: Insufficient documentation

## 2013-09-22 DIAGNOSIS — Z7902 Long term (current) use of antithrombotics/antiplatelets: Secondary | ICD-10-CM | POA: Insufficient documentation

## 2013-09-22 DIAGNOSIS — Z794 Long term (current) use of insulin: Secondary | ICD-10-CM | POA: Insufficient documentation

## 2013-09-22 DIAGNOSIS — F411 Generalized anxiety disorder: Secondary | ICD-10-CM | POA: Insufficient documentation

## 2013-09-22 DIAGNOSIS — Z9861 Coronary angioplasty status: Secondary | ICD-10-CM | POA: Insufficient documentation

## 2013-09-22 DIAGNOSIS — IMO0002 Reserved for concepts with insufficient information to code with codable children: Secondary | ICD-10-CM | POA: Insufficient documentation

## 2013-09-22 DIAGNOSIS — R079 Chest pain, unspecified: Secondary | ICD-10-CM | POA: Insufficient documentation

## 2013-09-22 LAB — CBC WITH DIFFERENTIAL/PLATELET
Basophils Absolute: 0 10*3/uL (ref 0.0–0.1)
Basophils Relative: 0 % (ref 0–1)
Eosinophils Absolute: 0.1 10*3/uL (ref 0.0–0.7)
Eosinophils Relative: 1 % (ref 0–5)
HCT: 44 % (ref 36.0–46.0)
MCHC: 33.9 g/dL (ref 30.0–36.0)
MCV: 95.9 fL (ref 78.0–100.0)
Monocytes Absolute: 0.5 10*3/uL (ref 0.1–1.0)
RDW: 13 % (ref 11.5–15.5)

## 2013-09-22 LAB — POCT I-STAT TROPONIN I
Troponin i, poc: 0 ng/mL (ref 0.00–0.08)
Troponin i, poc: 0.01 ng/mL (ref 0.00–0.08)

## 2013-09-22 LAB — COMPREHENSIVE METABOLIC PANEL
AST: 18 U/L (ref 0–37)
Albumin: 3.9 g/dL (ref 3.5–5.2)
Calcium: 10.1 mg/dL (ref 8.4–10.5)
Creatinine, Ser: 0.68 mg/dL (ref 0.50–1.10)

## 2013-09-22 MED ORDER — PREDNISONE 20 MG PO TABS: 40.0000 mg | ORAL_TABLET | Freq: Every day | ORAL | Status: DC

## 2013-09-22 MED ORDER — HYDROCODONE-ACETAMINOPHEN 7.5-325 MG/15ML PO SOLN: 15.0000 mL | Freq: Four times a day (QID) | ORAL | Status: DC | PRN

## 2013-09-22 MED ORDER — PREDNISONE 20 MG PO TABS
60.0000 mg | ORAL_TABLET | Freq: Once | ORAL | Status: AC
Start: 2013-09-22 — End: 2013-09-22
  Administered 2013-09-22: 60 mg via ORAL
  Filled 2013-09-22: qty 3

## 2013-09-22 MED ORDER — IPRATROPIUM BROMIDE 0.02 % IN SOLN
0.5000 mg | Freq: Once | RESPIRATORY_TRACT | Status: AC
  Administered 2013-09-22: 0.5 mg via RESPIRATORY_TRACT
  Filled 2013-09-22: qty 2.5

## 2013-09-22 MED ORDER — HYDROCODONE-ACETAMINOPHEN 5-325 MG PO TABS
2.0000 | ORAL_TABLET | Freq: Once | ORAL | Status: AC
  Administered 2013-09-22: 2 via ORAL
  Filled 2013-09-22: qty 2

## 2013-09-22 MED ORDER — LEVOFLOXACIN 750 MG PO TABS: 750.0000 mg | ORAL_TABLET | Freq: Every day | ORAL | Status: DC

## 2013-09-22 MED ORDER — LEVOFLOXACIN 500 MG PO TABS
500.0000 mg | ORAL_TABLET | Freq: Every day | ORAL | Status: DC
  Administered 2013-09-22: 500 mg via ORAL
  Filled 2013-09-22: qty 1

## 2013-09-22 MED ORDER — ALBUTEROL SULFATE (5 MG/ML) 0.5% IN NEBU
5.0000 mg | INHALATION_SOLUTION | Freq: Once | RESPIRATORY_TRACT | Status: AC
  Administered 2013-09-22: 5 mg via RESPIRATORY_TRACT
  Filled 2013-09-22: qty 1

## 2013-09-22 NOTE — ED Provider Notes (Signed)
CSN: [id number]     Arrival date & time 09/22/13  1703 History   First MD Initiated Contact with Patient 09/22/13 1733     Chief Complaint  Patient presents with  . Chest Pain   (Consider location/radiation/quality/duration/timing/severity/associated sxs/prior Treatment) HPI  [name] is a 72 y.o. female with past medical history significant for COPD (using her nebulizer more than normal recently), CAD status post stent placement in 2012, and insulin-dependent diabetes c/o cold, 7/10 bilateral chest pain starting yesterday worsening over today, radiating to the back, pain is intermittent, she describes it is exertional. She denies pleuritic chest pain (this contradicts the nursing note). She's been taking no pain medications prior to arrival. She endorses an intermittently productive cough. She denies shortness of breath above her baseline. Patient denies history of DVT or PE but she notes that she had a left leg swelling with calf pain for the last 2 days which is now resolved. She recalls endorses a tactile fever. She denies hemoptysis or recent immobilization. Patient taken a full dose aspirin this a.m. Patient was treated for pneumonia starting 10 days of Levaquin and a prednisone taper on September 3. Patient states she was compliant with the medications. Patient's daughter deny having prior MI however record review clearly states she is status post inferior STEMI in  RF: prior MI and and Stent Cath: 2012 Cardiologist: Unknown PCP: Deboraha Sprang white    Past Medical History  Diagnosis Date  . CAD (coronary artery disease)     s/p inferior STEMI 12/29/10 with rotablator atherectomy RCA 12/31/10 and DES x 2 RCA  . Ischemic cardiomyopathy     EF 45% by cath 12/29/10.   . Diabetes mellitus     type 2  . HTN (hypertension)   . Hyperlipidemia   . Diverticulosis   . Asthma   . COPD (chronic obstructive pulmonary disease)   . Small bowel obstruction     from a sigmoid stricture  .  Nephrolithiasis   . Sleep apnea   . Overweight(278.02)   . Osteoarthritis   . GERD (gastroesophageal reflux disease)   . Anxiety   . Glaucoma   . History of colonoscopy   . Memory deficit 04/23/2013   Past Surgical History  Procedure Laterality Date  . Bilateral tubal ligation    . Lithotripsy    . 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 Cardiac surgery     Family History  Problem Relation Age of Onset  . Breast cancer Maternal Grandmother   . Diabetes Father   . Heart disease Father   . Diabetes Sister   . Colon cancer Neg Hx    History  Substance Use Topics  . Smoking status: Current Every Day Smoker -- 0.50 packs/day for 25 years    Types: Cigarettes  . Smokeless tobacco: Never Used     Comment: over a ppd for 40+ years; quit 10/2004.  unsuccessfully tried eCigs.  . Alcohol Use: 0.5 oz/week    1 drink(s) per week     Comment: rare    OB History   Grav Para Term Preterm Abortions TAB SAB Ect Mult Living                 Review of Systems 10 systems reviewed and found to be negative, except as noted in the HPI  Allergies  Lisinopril; Eggs or egg-derived products; Erythromycin; Iodine; Penicillins; Pentazocine lactate; Propoxyphene hcl; Quinapril hcl; and Sulfonamide derivatives  Home Medications  Current Outpatient Rx  Name  Route  Sig  Dispense  Refill  . albuterol (PROVENTIL HFA;VENTOLIN HFA) 108 (90 BASE) MCG/ACT inhaler   Inhalation   Inhale 2 puffs into the lungs every 6 (six) hours as needed for wheezing.         Marland Kitchen albuterol (PROVENTIL) (2.5 MG/3ML) 0.083% nebulizer solution   Nebulization   Take 2.5 mg by nebulization every 6 (six) hours as needed for wheezing or shortness of breath.          Marland Kitchen aspirin 325 MG tablet   Oral   Take 325 mg by mouth every morning.          . clopidogrel (PLAVIX) 75 MG tablet   Oral   Take 75 mg by mouth every morning.         . Guaifenesin 1200 MG TB12   Oral   Take 1 tablet  (1,200 mg total) by mouth 2 (two) times daily.   20 each   0   . HYDROcodone-homatropine (HYDROMET) 5-1.5 MG/5ML syrup   Oral   Take 5 mLs by mouth every 6 (six) hours as needed for cough.   240 mL   0   . hydrOXYzine (ATARAX/VISTARIL) 25 MG tablet   Oral   Take 12.5-25 mg by mouth every 6 (six) hours as needed for itching.          . insulin aspart (NOVOLOG) 100 unit/ml SOLN   Subcutaneous   Inject 5 Units into the skin 3 (three) times daily with meals.          . insulin glargine (LANTUS) 100 UNIT/ML injection   Subcutaneous   Inject 12 Units into the skin at bedtime.         . JENTADUETO 2.03-999 MG TABS   Oral   Take 1 tablet by mouth 2 (two) times daily.          Marland Kitchen LORazepam (ATIVAN) 0.5 MG tablet   Oral   Take 0.5 mg by mouth 2 (two) times daily as needed for anxiety.         . metoprolol tartrate (LOPRESSOR) 25 MG tablet   Oral   Take 0.5 tablets (12.5 mg total) by mouth 2 (two) times daily.   30 tablet   11   . mometasone-formoterol (DULERA) 200-5 MCG/ACT AERO   Inhalation   Inhale 2 puffs into the lungs 2 (two) times daily.         . nitroGLYCERIN (NITROSTAT) 0.4 MG SL tablet   Sublingual   Place 1 tablet (0.4 mg total) under the tongue every 5 (five) minutes as needed. For chest pain.   25 tablet   6   . oxyCODONE-acetaminophen (PERCOCET/ROXICET) 5-325 MG per tablet   Oral   Take 1 tablet by mouth every 6 (six) hours as needed for pain.          . pravastatin (PRAVACHOL) 40 MG tablet   Oral   Take 1 tablet (40 mg total) by mouth every evening.   30 tablet   6   . pregabalin (LYRICA) 75 MG capsule   Oral   Take 75 mg by mouth 2 (two) times daily.           . promethazine-dextromethorphan (PROMETHAZINE-DM) 6.25-15 MG/5ML syrup   Oral   Take 5 mLs by mouth every 4 (four) hours as needed for cough.   120 mL   0   . rivastigmine (EXELON) 9.5 mg/24hr   Transdermal   Place  1 patch onto the skin every morning.          .  timolol (BETIMOL) 0.5 % ophthalmic solution   Right Eye   Place 1 drop into the right eye 2 (two) times daily.           Marland Kitchen tolterodine (DETROL LA) 4 MG 24 hr capsule   Oral   Take 4 mg by mouth every morning.          Marland Kitchen HYDROcodone-acetaminophen (HYCET) 7.5-325 mg/15 ml solution   Oral   Take 15 mLs by mouth every 6 (six) hours as needed for cough.   45 mL   0   . levofloxacin (LEVAQUIN) 750 MG tablet   Oral   Take 1 tablet (750 mg total) by mouth daily.   10 tablet   0   . predniSONE (DELTASONE) 20 MG tablet   Oral   Take 2 tablets (40 mg total) by mouth daily.   10 tablet   0    BP 167/82  Pulse 105  Temp(Src) 98.2 F (36.8 C) (Oral)  Resp 14  SpO2 95% Physical Exam  Nursing note and vitals reviewed. Constitutional: She is oriented to person, place, and time. She appears well-developed and well-nourished. No distress.  HENT:  Head: Normocephalic and atraumatic.  Mouth/Throat: Oropharynx is clear and moist.  Eyes: Conjunctivae and EOM are normal. Pupils are equal, round, and reactive to light.  Neck: Normal range of motion.  Cardiovascular: Normal rate, regular rhythm and intact distal pulses.   Pulmonary/Chest: Effort normal and breath sounds normal. No stridor. No respiratory distress.  Reduced air movement in all fields with no rhonchi with rales or wheezes  Abdominal: Soft. Bowel sounds are normal. She exhibits no distension and no mass. There is no tenderness. There is no rebound and no guarding.  Musculoskeletal: Normal range of motion. She exhibits no edema and no tenderness.  No calf asymmetry, superficial collaterals, palpable cords, edema, Homans sign negative bilaterally.    Neurological: She is alert and oriented to person, place, and time.  Psychiatric: She has a normal mood and affect.    ED Course  Procedures (including critical care time) Labs Review Labs Reviewed  COMPREHENSIVE METABOLIC PANEL - Abnormal; Notable for the following:     Glucose, Bld 147 (*)    GFR calc non Af Amer 86 (*)    All other components within normal limits  PRO B NATRIURETIC PEPTIDE - Abnormal; Notable for the following:    Pro B Natriuretic peptide (BNP) 529.8 (*)    All other components within normal limits  CBC WITH DIFFERENTIAL  D-DIMER, QUANTITATIVE  POCT I-STAT TROPONIN I  POCT I-STAT TROPONIN I   Imaging Review Dg Chest 2 View  09/22/2013   CLINICAL DATA:  Chest pain. Short of breath. Cough.  EXAM: CHEST  2 VIEW  COMPARISON:  08/11/2013  FINDINGS: The cardiac silhouette is mildly enlarged. The mediastinum is normal in contour. No hilar masses.  The lungs are hyperexpanded with flattened hemidiaphragms. The lungs are clear.  No pleural effusion or pneumothorax.  The bony thorax is demineralized but intact.  IMPRESSION: No acute cardiopulmonary disease.  COPD.   Electronically Signed   By: Amie Portland M.D.   On: 09/22/2013 17:50    EKG Interpretation     Ventricular Rate:  104 PR Interval:  173 QRS Duration: 81 QT Interval:  377 QTC Calculation: 496 R Axis:   80 Text Interpretation:  Sinus tachycardia Anterior infarct, old  MDM   1. Chest pain   2. COPD exacerbation     Filed Vitals:   09/22/13 1721 09/22/13 1936  BP: 167/82   Pulse: 105   Temp: 98.2 F (36.8 C)   TempSrc: Oral   Resp: 14   SpO2: 94% 95%     SORAYAH SCHRODT is a 72 y.o. female complaining of bilateral lower anterior chest pain. EKG is nonischemic, troponin is negative, BNP is not terribly elevated for her baseline at 529. D-dimer is negative chest x-ray shows no infiltrate blood work is otherwise unremarkable. I discussed case with attending Dr. Rhunette Croft who agrees that obtaining a second troponin and discharged home with antibiotics and treatment for COPD exacerbation is a reasonable course of action. Patient appears reliable for followup with her cardiologist and also her pulmonologist. We have discussed return precautions with her and  her sister both verbalized her understanding.  Medications  HYDROcodone-acetaminophen (NORCO/VICODIN) 5-325 MG per tablet 2 tablet (2 tablets Oral Given 09/22/13 1822)  predniSONE (DELTASONE) tablet 60 mg (60 mg Oral Given 09/22/13 1926)  ipratropium (ATROVENT) nebulizer solution 0.5 mg (0.5 mg Nebulization Given 09/22/13 1934)  albuterol (PROVENTIL) (5 MG/ML) 0.5% nebulizer solution 5 mg (5 mg Nebulization Given 09/22/13 1934)    Pt is hemodynamically stable, appropriate for, and amenable to discharge at this time. Pt verbalized understanding and agrees with care plan. All questions answered. Outpatient follow-up and specific return precautions discussed.    Discharge Medication List as of 09/22/2013  9:41 PM    START taking these medications   Details  HYDROcodone-acetaminophen (HYCET) 7.5-325 mg/15 ml solution Take 15 mLs by mouth every 6 (six) hours as needed for cough., Starting 09/22/2013, Until Discontinued, Print    levofloxacin (LEVAQUIN) 750 MG tablet Take 1 tablet (750 mg total) by mouth daily., Starting 09/22/2013, Until Discontinued, Print    predniSONE (DELTASONE) 20 MG tablet Take 2 tablets (40 mg total) by mouth daily., Starting 09/22/2013, Until Discontinued, Print        Note: Portions of this report may have been transcribed using voice recognition software. Every effort was made to ensure accuracy; however, inadvertent computerized transcription errors may be present     Wynetta Emery, PA-C 09/23/13 0101

## 2013-09-22 NOTE — ED Notes (Addendum)
Pt states she started having chest pain this morning. Has/is getting over PNA at this time. Hurts more whens he coughs. Productive cough. Also notes swelling to L leg.

## 2013-09-23 NOTE — ED Provider Notes (Signed)
Shared service with midlevel provider. I have personally seen and examined the patient, providing direct face to face care, presenting with the chief complaint of cough. Physical exam findings include no wheezing, no rhonchi/rales on lung exam, no pttig edema, unilateral swelling. Pt mentions some right sided chest pain, which is worse with cough and reproducible on palpation. Has CAD, but this pain is very atypical.  Plan will be trops x 2 and get Pulm and Cards f/u. We will treat as a CAP, as she has hx of PNA that was missed on xray and visible on CT, and since she has some leukocytosis with worsening cough. I have reviewed the nursing documentation on past medical history, family history, and social history.   Derwood Kaplan, MD 09/23/13 [id number]

## 2013-09-25 ENCOUNTER — Other Ambulatory Visit: Payer: Self-pay

## 2013-10-10 ENCOUNTER — Inpatient Hospital Stay (HOSPITAL_COMMUNITY)
Admission: AD | Admit: 2013-10-10 | Discharge: 2013-10-14 | DRG: 190 | Disposition: A | Discharge status: Home Health | Payer: Medicare Other | Source: Ambulatory Visit | Attending: Internal Medicine | Admitting: Internal Medicine

## 2013-10-10 ENCOUNTER — Ambulatory Visit (INDEPENDENT_AMBULATORY_CARE_PROVIDER_SITE_OTHER): Payer: Medicare Other | Admitting: Internal Medicine

## 2013-10-10 ENCOUNTER — Encounter: Payer: Self-pay | Admitting: Internal Medicine

## 2013-10-10 ENCOUNTER — Encounter (HOSPITAL_COMMUNITY): Payer: Self-pay

## 2013-10-10 ENCOUNTER — Inpatient Hospital Stay (HOSPITAL_COMMUNITY): Payer: Medicare Other

## 2013-10-10 VITALS — BP 140/80 | HR 98 | Temp 97.3°F | Ht 65.0 in | Wt 150.4 lb

## 2013-10-10 DIAGNOSIS — I5043 Acute on chronic combined systolic (congestive) and diastolic (congestive) heart failure: Secondary | ICD-10-CM | POA: Diagnosis present

## 2013-10-10 DIAGNOSIS — F411 Generalized anxiety disorder: Secondary | ICD-10-CM | POA: Diagnosis present

## 2013-10-10 DIAGNOSIS — Z888 Allergy status to other drugs, medicaments and biological substances status: Secondary | ICD-10-CM | POA: Diagnosis not present

## 2013-10-10 DIAGNOSIS — R05 Cough: Secondary | ICD-10-CM

## 2013-10-10 DIAGNOSIS — M1990 Unspecified osteoarthritis, unspecified site: Secondary | ICD-10-CM | POA: Diagnosis present

## 2013-10-10 DIAGNOSIS — H409 Unspecified glaucoma: Secondary | ICD-10-CM | POA: Diagnosis present

## 2013-10-10 DIAGNOSIS — I5031 Acute diastolic (congestive) heart failure: Secondary | ICD-10-CM

## 2013-10-10 DIAGNOSIS — J180 Bronchopneumonia, unspecified organism: Secondary | ICD-10-CM | POA: Diagnosis present

## 2013-10-10 DIAGNOSIS — Z7982 Long term (current) use of aspirin: Secondary | ICD-10-CM

## 2013-10-10 DIAGNOSIS — K5730 Diverticulosis of large intestine without perforation or abscess without bleeding: Secondary | ICD-10-CM | POA: Diagnosis present

## 2013-10-10 DIAGNOSIS — G4733 Obstructive sleep apnea (adult) (pediatric): Secondary | ICD-10-CM | POA: Diagnosis present

## 2013-10-10 DIAGNOSIS — R51 Headache: Secondary | ICD-10-CM | POA: Diagnosis present

## 2013-10-10 DIAGNOSIS — J440 Chronic obstructive pulmonary disease with acute lower respiratory infection: Secondary | ICD-10-CM | POA: Diagnosis present

## 2013-10-10 DIAGNOSIS — Z8249 Family history of ischemic heart disease and other diseases of the circulatory system: Secondary | ICD-10-CM | POA: Diagnosis not present

## 2013-10-10 DIAGNOSIS — I252 Old myocardial infarction: Secondary | ICD-10-CM

## 2013-10-10 DIAGNOSIS — Z880 Allergy status to penicillin: Secondary | ICD-10-CM | POA: Diagnosis not present

## 2013-10-10 DIAGNOSIS — F09 Unspecified mental disorder due to known physiological condition: Secondary | ICD-10-CM | POA: Diagnosis present

## 2013-10-10 DIAGNOSIS — Z833 Family history of diabetes mellitus: Secondary | ICD-10-CM

## 2013-10-10 DIAGNOSIS — I2510 Atherosclerotic heart disease of native coronary artery without angina pectoris: Secondary | ICD-10-CM | POA: Diagnosis present

## 2013-10-10 DIAGNOSIS — Z794 Long term (current) use of insulin: Secondary | ICD-10-CM | POA: Diagnosis not present

## 2013-10-10 DIAGNOSIS — K219 Gastro-esophageal reflux disease without esophagitis: Secondary | ICD-10-CM | POA: Diagnosis present

## 2013-10-10 DIAGNOSIS — I10 Essential (primary) hypertension: Secondary | ICD-10-CM | POA: Diagnosis present

## 2013-10-10 DIAGNOSIS — IMO0001 Reserved for inherently not codable concepts without codable children: Secondary | ICD-10-CM

## 2013-10-10 DIAGNOSIS — E785 Hyperlipidemia, unspecified: Secondary | ICD-10-CM

## 2013-10-10 DIAGNOSIS — E663 Overweight: Secondary | ICD-10-CM | POA: Diagnosis present

## 2013-10-10 DIAGNOSIS — T380X5A Adverse effect of glucocorticoids and synthetic analogues, initial encounter: Secondary | ICD-10-CM | POA: Diagnosis not present

## 2013-10-10 DIAGNOSIS — D72829 Elevated white blood cell count, unspecified: Secondary | ICD-10-CM | POA: Diagnosis not present

## 2013-10-10 DIAGNOSIS — I2589 Other forms of chronic ischemic heart disease: Secondary | ICD-10-CM | POA: Diagnosis present

## 2013-10-10 DIAGNOSIS — R413 Other amnesia: Secondary | ICD-10-CM

## 2013-10-10 DIAGNOSIS — F17200 Nicotine dependence, unspecified, uncomplicated: Secondary | ICD-10-CM | POA: Diagnosis present

## 2013-10-10 DIAGNOSIS — J441 Chronic obstructive pulmonary disease with (acute) exacerbation: Secondary | ICD-10-CM

## 2013-10-10 DIAGNOSIS — R053 Chronic cough: Secondary | ICD-10-CM

## 2013-10-10 DIAGNOSIS — J209 Acute bronchitis, unspecified: Secondary | ICD-10-CM | POA: Diagnosis present

## 2013-10-10 DIAGNOSIS — I25810 Atherosclerosis of coronary artery bypass graft(s) without angina pectoris: Secondary | ICD-10-CM

## 2013-10-10 DIAGNOSIS — Z803 Family history of malignant neoplasm of breast: Secondary | ICD-10-CM | POA: Diagnosis not present

## 2013-10-10 DIAGNOSIS — J9600 Acute respiratory failure, unspecified whether with hypoxia or hypercapnia: Secondary | ICD-10-CM

## 2013-10-10 DIAGNOSIS — E119 Type 2 diabetes mellitus without complications: Secondary | ICD-10-CM

## 2013-10-10 DIAGNOSIS — Z87442 Personal history of urinary calculi: Secondary | ICD-10-CM | POA: Diagnosis not present

## 2013-10-10 DIAGNOSIS — Z79899 Other long term (current) drug therapy: Secondary | ICD-10-CM

## 2013-10-10 DIAGNOSIS — I059 Rheumatic mitral valve disease, unspecified: Secondary | ICD-10-CM

## 2013-10-10 DIAGNOSIS — I509 Heart failure, unspecified: Secondary | ICD-10-CM | POA: Diagnosis present

## 2013-10-10 DIAGNOSIS — I5021 Acute systolic (congestive) heart failure: Secondary | ICD-10-CM

## 2013-10-10 DIAGNOSIS — R739 Hyperglycemia, unspecified: Secondary | ICD-10-CM

## 2013-10-10 DIAGNOSIS — E876 Hypokalemia: Secondary | ICD-10-CM

## 2013-10-10 HISTORY — DX: Respiratory arrest: R09.2

## 2013-10-10 LAB — CBC WITH DIFFERENTIAL/PLATELET
Basophils Absolute: 0 10*3/uL (ref 0.0–0.1)
HCT: 44.4 % (ref 36.0–46.0)
Lymphocytes Relative: 21 % (ref 12–46)
Lymphs Abs: 2.1 10*3/uL (ref 0.7–4.0)
MCV: 95.9 fL (ref 78.0–100.0)
Monocytes Absolute: 0.4 10*3/uL (ref 0.1–1.0)
Neutro Abs: 7.6 10*3/uL (ref 1.7–7.7)
Platelets: 147 10*3/uL — ABNORMAL LOW (ref 150–400)
RBC: 4.63 MIL/uL (ref 3.87–5.11)
RDW: 13.2 % (ref 11.5–15.5)
WBC: 10.3 10*3/uL (ref 4.0–10.5)

## 2013-10-10 LAB — BLOOD GAS, ARTERIAL
Drawn by: 331471
O2 Content: 2 L/min
Patient temperature: 98.6
TCO2: 22.2 mmol/L (ref 0–100)
pCO2 arterial: 46.7 mmHg — ABNORMAL HIGH (ref 35.0–45.0)
pH, Arterial: 7.35 (ref 7.350–7.450)
pO2, Arterial: 92.3 mmHg (ref 80.0–100.0)

## 2013-10-10 LAB — URINALYSIS, ROUTINE W REFLEX MICROSCOPIC
Glucose, UA: NEGATIVE mg/dL
Hgb urine dipstick: NEGATIVE
Ketones, ur: NEGATIVE mg/dL
Leukocytes, UA: NEGATIVE
Nitrite: NEGATIVE
Specific Gravity, Urine: 1.034 — ABNORMAL HIGH (ref 1.005–1.030)
Urobilinogen, UA: 0.2 mg/dL (ref 0.0–1.0)
pH: 5.5 (ref 5.0–8.0)

## 2013-10-10 LAB — COMPREHENSIVE METABOLIC PANEL
ALT: 9 U/L (ref 0–35)
AST: 13 U/L (ref 0–37)
Albumin: 3.6 g/dL (ref 3.5–5.2)
CO2: 24 mEq/L (ref 19–32)
Calcium: 9.5 mg/dL (ref 8.4–10.5)
Creatinine, Ser: 0.62 mg/dL (ref 0.50–1.10)
GFR calc Af Amer: >90 mL/min (ref >90)
GFR calc non Af Amer: 89 mL/min — ABNORMAL LOW (ref >90)
Glucose, Bld: 99 mg/dL (ref 70–99)
Potassium: 4.5 mEq/L (ref 3.5–5.1)
Sodium: 140 mEq/L (ref 135–145)
Total Protein: 7.2 g/dL (ref 6.0–8.3)

## 2013-10-10 LAB — MAGNESIUM: Magnesium: 1.4 mg/dL — ABNORMAL LOW (ref 1.5–2.5)

## 2013-10-10 LAB — PROCALCITONIN: Procalcitonin: <0.1 ng/mL

## 2013-10-10 LAB — PRO B NATRIURETIC PEPTIDE: Pro B Natriuretic peptide (BNP): 776.6 pg/mL — ABNORMAL HIGH (ref 0–125)

## 2013-10-10 LAB — GLUCOSE, CAPILLARY: Glucose-Capillary: 203 mg/dL — ABNORMAL HIGH (ref 70–99)

## 2013-10-10 LAB — PHOSPHORUS: Phosphorus: 3.6 mg/dL (ref 2.3–4.6)

## 2013-10-10 LAB — TROPONIN I: Troponin I: <0.3 ng/mL (ref <0.30)

## 2013-10-10 MED ORDER — INSULIN ASPART 100 UNIT/ML FLEXPEN: 5.0000 [IU] | Freq: Three times a day (TID) | SUBCUTANEOUS | Status: DC

## 2013-10-10 MED ORDER — INSULIN GLARGINE 100 UNIT/ML ~~LOC~~ SOLN
12.0000 [IU] | Freq: Every day | SUBCUTANEOUS | Status: DC
  Administered 2013-10-11 – 2013-10-13 (×3): 12 [IU] via SUBCUTANEOUS
  Filled 2013-10-10 (×3): qty 0.12

## 2013-10-10 MED ORDER — GUAIFENESIN ER 1200 MG PO TB12: 1.0000 | ORAL_TABLET | Freq: Two times a day (BID) | ORAL | Status: DC

## 2013-10-10 MED ORDER — SODIUM CHLORIDE 0.9 % IV SOLN: 250.0000 mL | INTRAVENOUS | Status: DC | PRN

## 2013-10-10 MED ORDER — ACETAMINOPHEN 325 MG PO TABS
650.0000 mg | ORAL_TABLET | Freq: Four times a day (QID) | ORAL | Status: DC | PRN
  Administered 2013-10-10 – 2013-10-12 (×3): 650 mg via ORAL
  Filled 2013-10-10 (×3): qty 2

## 2013-10-10 MED ORDER — LORAZEPAM 0.5 MG PO TABS
0.5000 mg | ORAL_TABLET | Freq: Two times a day (BID) | ORAL | Status: DC | PRN
  Administered 2013-10-11 – 2013-10-14 (×2): 0.5 mg via ORAL
  Filled 2013-10-10 (×2): qty 1

## 2013-10-10 MED ORDER — SODIUM CHLORIDE 0.9 % IJ SOLN: 3.0000 mL | INTRAMUSCULAR | Status: DC | PRN

## 2013-10-10 MED ORDER — INSULIN ASPART 100 UNIT/ML ~~LOC~~ SOLN
0.0000 [IU] | Freq: Three times a day (TID) | SUBCUTANEOUS | Status: DC
  Administered 2013-10-10: 3 [IU] via SUBCUTANEOUS
  Administered 2013-10-11: 11 [IU] via SUBCUTANEOUS
  Administered 2013-10-11: 15 [IU] via SUBCUTANEOUS
  Administered 2013-10-11: 7 [IU] via SUBCUTANEOUS
  Administered 2013-10-12 (×2): 20 [IU] via SUBCUTANEOUS
  Administered 2013-10-12: 4 [IU] via SUBCUTANEOUS
  Administered 2013-10-13 (×2): 7 [IU] via SUBCUTANEOUS
  Administered 2013-10-13: 11 [IU] via SUBCUTANEOUS
  Administered 2013-10-14: 15 [IU] via SUBCUTANEOUS
  Administered 2013-10-14: 08:00:00 via SUBCUTANEOUS

## 2013-10-10 MED ORDER — METOPROLOL TARTRATE 12.5 MG HALF TABLET
12.5000 mg | ORAL_TABLET | Freq: Two times a day (BID) | ORAL | Status: DC
  Administered 2013-10-10 – 2013-10-12 (×4): 12.5 mg via ORAL
  Filled 2013-10-10 (×5): qty 1

## 2013-10-10 MED ORDER — ONDANSETRON HCL 4 MG PO TABS: 4.0000 mg | ORAL_TABLET | Freq: Four times a day (QID) | ORAL | Status: DC | PRN

## 2013-10-10 MED ORDER — SODIUM CHLORIDE 0.9 % IJ SOLN
3.0000 mL | Freq: Two times a day (BID) | INTRAMUSCULAR | Status: DC
  Administered 2013-10-10: 3 mL via INTRAVENOUS

## 2013-10-10 MED ORDER — CLOPIDOGREL BISULFATE 75 MG PO TABS
75.0000 mg | ORAL_TABLET | Freq: Every day | ORAL | Status: DC
  Filled 2013-10-10 (×2): qty 1

## 2013-10-10 MED ORDER — TIMOLOL HEMIHYDRATE 0.5 % OP SOLN: 1.0000 [drp] | Freq: Two times a day (BID) | OPHTHALMIC | Status: DC

## 2013-10-10 MED ORDER — ONDANSETRON HCL 4 MG/2ML IJ SOLN: 4.0000 mg | Freq: Four times a day (QID) | INTRAMUSCULAR | Status: DC | PRN

## 2013-10-10 MED ORDER — HEPARIN SODIUM (PORCINE) 5000 UNIT/ML IJ SOLN
5000.0000 [IU] | Freq: Three times a day (TID) | INTRAMUSCULAR | Status: DC
  Administered 2013-10-10 – 2013-10-14 (×12): 5000 [IU] via SUBCUTANEOUS
  Filled 2013-10-10 (×15): qty 1

## 2013-10-10 MED ORDER — ASPIRIN 325 MG PO TABS
325.0000 mg | ORAL_TABLET | Freq: Every day | ORAL | Status: DC
  Administered 2013-10-11 – 2013-10-12 (×2): 325 mg via ORAL
  Filled 2013-10-10 (×2): qty 1

## 2013-10-10 MED ORDER — METHYLPREDNISOLONE SODIUM SUCC 125 MG IJ SOLR
60.0000 mg | Freq: Three times a day (TID) | INTRAMUSCULAR | Status: DC
  Administered 2013-10-10 – 2013-10-12 (×6): 60 mg via INTRAVENOUS
  Filled 2013-10-10 (×9): qty 0.96

## 2013-10-10 MED ORDER — PANTOPRAZOLE SODIUM 40 MG PO TBEC
40.0000 mg | DELAYED_RELEASE_TABLET | Freq: Every day | ORAL | Status: DC
  Administered 2013-10-10 – 2013-10-14 (×5): 40 mg via ORAL
  Filled 2013-10-10 (×5): qty 1

## 2013-10-10 MED ORDER — SIMVASTATIN 20 MG PO TABS
20.0000 mg | ORAL_TABLET | Freq: Every day | ORAL | Status: DC
  Administered 2013-10-11 – 2013-10-13 (×3): 20 mg via ORAL
  Filled 2013-10-10 (×4): qty 1

## 2013-10-10 MED ORDER — ALBUTEROL SULFATE (5 MG/ML) 0.5% IN NEBU
2.5000 mg | INHALATION_SOLUTION | Freq: Four times a day (QID) | RESPIRATORY_TRACT | Status: DC
  Administered 2013-10-10 – 2013-10-14 (×14): 2.5 mg via RESPIRATORY_TRACT
  Filled 2013-10-10 (×15): qty 0.5

## 2013-10-10 MED ORDER — METOPROLOL TARTRATE 12.5 MG HALF TABLET
12.5000 mg | ORAL_TABLET | Freq: Two times a day (BID) | ORAL | Status: DC
  Filled 2013-10-10 (×2): qty 1

## 2013-10-10 MED ORDER — ALBUTEROL SULFATE (5 MG/ML) 0.5% IN NEBU: 2.5000 mg | INHALATION_SOLUTION | RESPIRATORY_TRACT | Status: DC | PRN

## 2013-10-10 MED ORDER — FUROSEMIDE 10 MG/ML IJ SOLN
40.0000 mg | Freq: Once | INTRAMUSCULAR | Status: AC
  Administered 2013-10-10: 40 mg via INTRAVENOUS
  Filled 2013-10-10: qty 4

## 2013-10-10 MED ORDER — ASPIRIN 325 MG PO TABS
325.0000 mg | ORAL_TABLET | Freq: Every morning | ORAL | Status: DC
  Filled 2013-10-10: qty 1

## 2013-10-10 MED ORDER — RIVASTIGMINE 9.5 MG/24HR TD PT24
9.5000 mg | MEDICATED_PATCH | Freq: Every morning | TRANSDERMAL | Status: DC
  Administered 2013-10-10 – 2013-10-14 (×5): 9.5 mg via TRANSDERMAL
  Filled 2013-10-10 (×5): qty 1

## 2013-10-10 MED ORDER — ACETAMINOPHEN 650 MG RE SUPP: 650.0000 mg | Freq: Four times a day (QID) | RECTAL | Status: DC | PRN

## 2013-10-10 MED ORDER — SODIUM CHLORIDE 0.9 % IJ SOLN
3.0000 mL | Freq: Two times a day (BID) | INTRAMUSCULAR | Status: DC
  Administered 2013-10-11 – 2013-10-14 (×5): 3 mL via INTRAVENOUS

## 2013-10-10 MED ORDER — SIMVASTATIN 20 MG PO TABS
20.0000 mg | ORAL_TABLET | Freq: Every day | ORAL | Status: DC
  Filled 2013-10-10: qty 1

## 2013-10-10 MED ORDER — MAGNESIUM SULFATE 40 MG/ML IJ SOLN
2.0000 g | Freq: Once | INTRAMUSCULAR | Status: AC
  Administered 2013-10-10: 2 g via INTRAVENOUS
  Filled 2013-10-10: qty 50

## 2013-10-10 MED ORDER — MOMETASONE FURO-FORMOTEROL FUM 200-5 MCG/ACT IN AERO
2.0000 | INHALATION_SPRAY | Freq: Two times a day (BID) | RESPIRATORY_TRACT | Status: DC
  Administered 2013-10-10 – 2013-10-14 (×8): 2 via RESPIRATORY_TRACT
  Filled 2013-10-10: qty 8.8

## 2013-10-10 MED ORDER — PREGABALIN 75 MG PO CAPS
75.0000 mg | ORAL_CAPSULE | Freq: Two times a day (BID) | ORAL | Status: DC
  Administered 2013-10-10 – 2013-10-14 (×8): 75 mg via ORAL
  Filled 2013-10-10 (×8): qty 1

## 2013-10-10 MED ORDER — FESOTERODINE FUMARATE ER 8 MG PO TB24
8.0000 mg | ORAL_TABLET | Freq: Every day | ORAL | Status: DC
  Administered 2013-10-11 – 2013-10-14 (×4): 8 mg via ORAL
  Filled 2013-10-10 (×5): qty 1

## 2013-10-10 MED ORDER — GUAIFENESIN ER 600 MG PO TB12
1200.0000 mg | ORAL_TABLET | Freq: Two times a day (BID) | ORAL | Status: DC
  Filled 2013-10-10 (×2): qty 2

## 2013-10-10 MED ORDER — IPRATROPIUM BROMIDE 0.02 % IN SOLN
0.5000 mg | Freq: Four times a day (QID) | RESPIRATORY_TRACT | Status: DC
  Administered 2013-10-10 – 2013-10-14 (×14): 0.5 mg via RESPIRATORY_TRACT
  Filled 2013-10-10 (×15): qty 2.5

## 2013-10-10 MED ORDER — HYDROXYZINE HCL 25 MG PO TABS
12.5000 mg | ORAL_TABLET | Freq: Four times a day (QID) | ORAL | Status: DC | PRN
  Filled 2013-10-10: qty 1

## 2013-10-10 MED ORDER — PREGABALIN 75 MG PO CAPS: 75.0000 mg | ORAL_CAPSULE | Freq: Two times a day (BID) | ORAL | Status: DC

## 2013-10-10 MED ORDER — SENNOSIDES-DOCUSATE SODIUM 8.6-50 MG PO TABS: 1.0000 | ORAL_TABLET | Freq: Every evening | ORAL | Status: DC | PRN

## 2013-10-10 MED ORDER — GUAIFENESIN ER 600 MG PO TB12
1200.0000 mg | ORAL_TABLET | Freq: Two times a day (BID) | ORAL | Status: DC
  Administered 2013-10-10 – 2013-10-14 (×8): 1200 mg via ORAL
  Filled 2013-10-10 (×9): qty 2

## 2013-10-10 MED ORDER — DOXYCYCLINE HYCLATE 100 MG IV SOLR
100.0000 mg | Freq: Two times a day (BID) | INTRAVENOUS | Status: DC
  Administered 2013-10-10 – 2013-10-12 (×4): 100 mg via INTRAVENOUS
  Filled 2013-10-10 (×5): qty 100

## 2013-10-10 MED ORDER — HYDROCODONE-HOMATROPINE 5-1.5 MG/5ML PO SYRP
5.0000 mL | ORAL_SOLUTION | Freq: Four times a day (QID) | ORAL | Status: DC | PRN
  Administered 2013-10-10 – 2013-10-13 (×4): 5 mL via ORAL
  Filled 2013-10-10 (×4): qty 5

## 2013-10-10 MED ORDER — INSULIN GLARGINE 100 UNIT/ML ~~LOC~~ SOLN
12.0000 [IU] | Freq: Every day | SUBCUTANEOUS | Status: DC
  Filled 2013-10-10: qty 0.12

## 2013-10-10 MED ORDER — CLOPIDOGREL BISULFATE 75 MG PO TABS
75.0000 mg | ORAL_TABLET | Freq: Every day | ORAL | Status: DC
  Administered 2013-10-11 – 2013-10-14 (×4): 75 mg via ORAL
  Filled 2013-10-10 (×5): qty 1

## 2013-10-10 NOTE — Progress Notes (Signed)
  Echocardiogram 2D Echocardiogram has been performed.  [place]ian Co 10/10/2013, 3:16 PM

## 2013-10-10 NOTE — H&P (Signed)
PULMONARY  / CRITICAL CARE MEDICINE  Name: [name] MRN: [id number] DOB: 1940/12/18    ADMISSION DATE:  10/10/2013  REFERRING MD :  [name], MD  PRIMARY SERVICE: PCCM   CHIEF COMPLAINT:  wprsening cough and wheeze and dyspnea, recurrent episodes  LINES / TUBES: None  CULTURES: Results for orders placed during the hospital encounter of 05/20/13  CULTURE, BLOOD (ROUTINE X 2)     Status: None   Collection Time    05/20/13 10:50 AM      Result Value Range Status   Specimen Description BLOOD RIGHT ARM   Final   Special Requests BOTTLES DRAWN AEROBIC AND ANAEROBIC 10CC   Final   Culture  Setup Time 05/20/2013 13:26   Final   Culture NO GROWTH 5 DAYS   Final   Report Status 05/26/2013 FINAL   Final  CULTURE, BLOOD (ROUTINE X 2)     Status: None   Collection Time    05/20/13 11:00 AM      Result Value Range Status   Specimen Description BLOOD LEFT ARM   Final   Special Requests BOTTLES DRAWN AEROBIC AND ANAEROBIC 10CC   Final   Culture  Setup Time 05/20/2013 13:26   Final   Culture NO GROWTH 5 DAYS   Final   Report Status 05/26/2013 FINAL   Final     ANTIBIOTICS: Anti-infectives   Start     Dose/Rate Route Frequency Ordered Stop   10/10/13 1500  doxycycline (VIBRAMYCIN) 100 mg in dextrose 5 % 250 mL IVPB     100 mg 125 mL/hr over 120 Minutes Intravenous Every 12 hours 10/10/13 1337         HISTORY OF PRESENT ILLNESS:   #Active smoker    #. 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 Refuses spiriva, flu shot, rehab  - In 2014: MDI is dulera   # REcurrent . AECOPD hx     -  Left lower lobe pneumonia and acute exacerbation COPD July 2014 with subsequent resolution on chest x-ray in July 2014. Rx levaquin  - ER visit 07/22/2013 COPD exacerbation  - Right lower lobe pneumonia 07/31/2013  CURRENT  Brought in by the son in law. Was in the emergency room 09/22/2013 and was discharged with Levaquin and  prednisone for a diagnosis COPD exacerbation. Apparently this helped symptoms but as soon as the course was over in 5 days she started having worsening shortness of breath cough and wheeze along with increased sputum volume. She does not know the sputum color because she swallows it or is difficult to come up. She then saw her primary care physician 4 days ago but due to her recurrent nature of COPD exacerbation she was asked to followup here and she presented to the office 10/10/2013. In the office she was found to be saturating 88% on room air at rest which is below her baseline. She was admitted to worsening orthopnea and worsening pedal edema compared to baseline. Not clear if there is a fever. COPD cat score significantly worse than baseline  She and her son-in-law her extreme concern about recurrent respiratory exacerbations  She is believed to still smoke   CAT COPD Symptom & Quality of Life Score (GSK trademark)  0 is no burden. 5 is highest burden  06/12/2013  09/09/2013  10/10/2013   Never Cough -> Cough all the time  4  5  5   No phlegm  in chest -> Chest is full of phlegm  3  2  5   No chest tightness -> Chest feels very tight  2  0  4  No dyspnea for 1 flight stairs/hill -> Very dyspneic for 1 flight of stairs  4  5  5   No limitations for ADL at home -> Very limited with ADL at home  3  3  4   Confident leaving home -> Not at all confident leaving home  3  2  3   Sleep soundly -> Do not sleep soundly because of lung condition  5  4  4   Lots of Energy -> No energy at all  5  5  5   TOTAL Score (max 40)  29  26  35      Review of Systems  Constitutional: Negative for fever and unexpected weight change.  HENT: Negative for congestion, dental problem, ear pain, nosebleeds, postnasal drip, rhinorrhea, sinus pressure, sneezing, sore throat and trouble swallowing.  Eyes: Negative for redness and itching.  Respiratory: Positive for cough, chest tightness, shortness of breath and wheezing.   Cardiovascular: Negative for palpitations and leg swelling.  Gastrointestinal: Negative for nausea and vomiting.  Genitourinary: Negative for dysuria.  Musculoskeletal: Negative for joint swelling.  Skin: Negative for rash.  Neurological: Negative for headaches.  Hematological: Does not bruise/bleed easily.  Psychiatric/Behavioral: Negative for dysphoric mood. The patient is not nervous    PAST MEDICAL HISTORY :  Past Medical History  Diagnosis Date  . CAD (coronary artery disease)     s/p inferior STEMI 12/29/10 with rotablator atherectomy RCA 12/31/10 and DES x 2 RCA  . Ischemic cardiomyopathy     EF 45% by cath 12/29/10.   . Diabetes mellitus     type 2  . HTN (hypertension)   . Hyperlipidemia   . Diverticulosis   . Asthma   . COPD (chronic obstructive pulmonary disease)   . Small bowel obstruction     from a sigmoid stricture  . Nephrolithiasis   . Sleep apnea   . Overweight(278.02)   . Osteoarthritis   . GERD (gastroesophageal reflux disease)   . Anxiety   . Glaucoma   . History of colonoscopy   . Memory deficit 04/23/2013   Past Surgical History  Procedure Laterality Date  . Bilateral tubal ligation    . Lithotripsy    . 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 Cardiac surgery     Prior to Admission medications   Medication Sig Start Date End Date Taking? Authorizing Provider  albuterol (PROVENTIL HFA;VENTOLIN HFA) 108 (90 BASE) MCG/ACT inhaler Inhale 2 puffs into the lungs every 6 (six) hours as needed for wheezing.   Yes Historical Provider, MD  albuterol (PROVENTIL) (2.5 MG/3ML) 0.083% nebulizer solution Take 2.5 mg by nebulization every 6 (six) hours as needed for wheezing or shortness of breath.    Yes Historical Provider, MD  aspirin 325 MG tablet Take 325 mg by mouth every morning.    Yes Historical Provider, MD  clopidogrel (PLAVIX) 75 MG tablet Take 75 mg by mouth every morning.   Yes Historical Provider, MD  Guaifenesin 1200  MG TB12 Take 1 tablet (1,200 mg total) by mouth 2 (two) times daily. 08/11/13  Yes Jamesetta Orleans Lawyer, PA-C  HYDROcodone-acetaminophen (HYCET) 7.5-325 mg/15 ml solution Take 15 mLs by mouth every 6 (six) hours as needed for cough. 09/22/13  Yes Nicole Pisciotta, PA-C  hydrOXYzine (ATARAX/VISTARIL) 25 MG tablet Take 12.5-25  mg by mouth every 6 (six) hours as needed for itching.    Yes Historical Provider, MD  ibuprofen (ADVIL,MOTRIN) 200 MG tablet Take 400 mg by mouth every 6 (six) hours as needed.   Yes Historical Provider, MD  insulin aspart (NOVOLOG) 100 unit/ml SOLN Inject 5 Units into the skin 3 (three) times daily with meals.  05/22/13  Yes Vassie Loll, MD  insulin glargine (LANTUS) 100 UNIT/ML injection Inject 12 Units into the skin daily.   Yes Historical Provider, MD  JENTADUETO 2.03-999 MG TABS Take 1 tablet by mouth 2 (two) times daily.  08/16/11  Yes Historical Provider, MD  levofloxacin (LEVAQUIN) 500 MG tablet Take 500 mg by mouth daily.   Yes Historical Provider, MD  LORazepam (ATIVAN) 0.5 MG tablet Take 0.5 mg by mouth 2 (two) times daily as needed for anxiety.   Yes Historical Provider, MD  metoprolol tartrate (LOPRESSOR) 25 MG tablet Take 0.5 tablets (12.5 mg total) by mouth 2 (two) times daily. 06/18/13  Yes [name], MD  mometasone-formoterol [facility]) 200-5 MCG/ACT AERO Inhale 2 puffs into the lungs 2 (two) times daily.   Yes Historical Provider, MD  oxyCODONE-acetaminophen (PERCOCET/ROXICET) 5-325 MG per tablet Take 1 tablet by mouth every 6 (six) hours as needed for pain.    Yes Historical Provider, MD  PARoxetine (PAXIL) 10 MG tablet Take 10 mg by mouth daily.   Yes Historical Provider, MD  pravastatin (PRAVACHOL) 40 MG tablet Take 1 tablet (40 mg total) by mouth every evening. 06/18/13  Yes [name], MD  pregabalin (LYRICA) 75 MG capsule Take 75 mg by mouth 2 (two) times daily.     Yes Historical Provider, MD  promethazine-dextromethorphan  (PROMETHAZINE-DM) 6.25-15 MG/5ML syrup Take 5 mLs by mouth every 4 (four) hours as needed for cough. 08/11/13  Yes Jamesetta Orleans Lawyer, PA-C  rivastigmine (EXELON) 9.5 mg/24hr Place 1 patch onto the skin every morning.    Yes Historical Provider, MD  tolterodine (DETROL LA) 4 MG 24 hr capsule Take 4 mg by mouth every morning.    Yes Historical Provider, MD  nitroGLYCERIN (NITROSTAT) 0.4 MG SL tablet Place 1 tablet (0.4 mg total) under the tongue every 5 (five) minutes as needed. For chest pain. 06/18/13   [name], MD   Allergies  Allergen Reactions  . Lisinopril     REACTION: respiratory arrest jan 2009  . Eggs Or Egg-Derived Products Diarrhea  . Erythromycin Other (See Comments)    Makes me feel weird   . Iodine     Pt may be allergic to iodine, does not remember the details, refuses contrast-CS  . Penicillins Itching and Other (See Comments)    Too much as a kid  . Pentazocine Lactate Other (See Comments)    Reaction unknown  . Propoxyphene Hcl Other (See Comments)    Reaction unknown  . Quinapril Hcl Itching and Other (See Comments)    Too much   . Sulfonamide Derivatives Other (See Comments)    Childhood allergy    FAMILY HISTORY:  Family History  Problem Relation Age of Onset  . Breast cancer Maternal Grandmother   . Diabetes Father   . Heart disease Father   . Diabetes Sister   . Colon cancer Neg Hx    SOCIAL HISTORY:  reports that she quit smoking 3 days ago. Her smoking use included Cigarettes. She has a 12.5 pack-year smoking history. She has never used smokeless tobacco. She reports that she drinks about 0.5  ounces of alcohol per week. She reports that she does not use illicit drugs.    SUBJECTIVE:   VITAL SIGNS: Temp:  [97.3 F (36.3 C)-97.5 F (36.4 C)] 97.5 F (36.4 C) (11/21 1258) Pulse Rate:  [80-98] 80 (11/21 1258) Resp:  [20] 20 (11/21 1258) BP: (140-154)/(70-80) 154/70 mmHg (11/21 1258) SpO2:  [88 %-93 %] 93 % (11/21 1258) FiO2 (%):   [21 %] 21 % (11/21 1339) Weight:  [145 lb 4.5 oz (65.9 kg)-150 lb 6.4 oz (68.221 kg)] 145 lb 4.5 oz (65.9 kg) (11/21 1258) HEMODYNAMICS:   VENTILATOR SETTINGS: Vent Mode:  [-]  FiO2 (%):  [21 %] 21 % INTAKE / OUTPUT: Intake/Output     11/20 0701 - 11/21 0700 11/21 0701 - 11/22 0700   P.O.  240   IV Piggyback  250   Total Intake(mL/kg)  490 (7.4)   Urine (mL/kg/hr)  300   Total Output   300   Net   +190        Urine Occurrence  1 x     PHYSICAL EXAMINATION: General:  Elderly female sitting in the office chamber looking more tired than usual Neuro:  Alert and oriented x3. Speech is normal. HEENT:  Supple neck. No neck nodes Cardiovascular:  Regular rate and rhythm Lungs:  Barrel chest. Some scattered wheeze not a respirator stress Abdomen:  Soft nontender no organomegaly Musculoskeletal:  Mild pedal edema worse than baseline Skin:  Appears intact  LABS:  CBC  Recent Labs Lab 10/10/13 1431  WBC 10.3  HGB 14.8  HCT 44.4  PLT 147*   Coag's No results found for this basename: APTT, INR,  in the last 168 hours BMET  Recent Labs Lab 10/10/13 1431  NA 140  K 4.5  CL 104  CO2 24  BUN 15  CREATININE 0.62  GLUCOSE 99   Electrolytes  Recent Labs Lab 10/10/13 1431  CALCIUM 9.5  MG 1.4*  PHOS 3.6   Sepsis Markers  Recent Labs Lab 10/10/13 1431 10/10/13 1432  LATICACIDVEN  --  2.7*  PROCALCITON <0.10  --    ABG  Recent Labs Lab 10/10/13 1352  PHART 7.350  PCO2ART 46.7*  PO2ART 92.3   Liver Enzymes  Recent Labs Lab 10/10/13 1431  AST 13  ALT 9  ALKPHOS 70  BILITOT 0.3  ALBUMIN 3.6   Cardiac Enzymes  Recent Labs Lab 10/10/13 1431 10/10/13 1432  TROPONINI <0.30  --   PROBNP  --  776.6*   Glucose  Recent Labs Lab 10/10/13 1701  GLUCAP 132*    Imaging X-ray Chest Pa And Lateral   10/10/2013   CLINICAL DATA:  Cough, shortness breath, weakness  EXAM: CHEST  2 VIEW  COMPARISON:  09/22/2013  FINDINGS: Lungs are essentially  clear. No focal consolidation. No pleural effusion or pneumothorax.  Mild cardiomegaly.  Mild degenerative changes of the visualized thoracolumbar spine.  IMPRESSION: No evidence of acute cardiopulmonary disease.   Electronically Signed   By: Charline Bills M.D.   On: 10/10/2013 15:42     ASSESSMENT / PLAN:  PULMONARY A:Likely recurrent AECOPD - uncelar why, ? Smoking, ? Aspiration, ? compliance P:   Admit Bronchodilators Steroids Doxy CT chest depending on course  CARDIOVASCULAR A: Likely diastolic Acute CHF P:  diuresise empiric x 1 Echo bnp troponin  RENAL A:  Low Mag P:   replete  GASTROINTESTINAL A:  Nil acute P:   monitor  HEMATOLOGIC A:  Nil acute P:  monitor  INFECTIOUS A:  AT risk for HCAP. Too many times has had levaquin in psat 90 days P:   Rx with doxy  ENDOCRINE A:  DM   P:   ssi  NEUROLOGIC A:  intact P:   Monitor  CODE  Full code     Dr. Kalman Shan, M.D., [facility].C.P Pulmonary and Critical Care Medicine Staff Physician [facility] Pager: [phone], If no answer or between  15:00h - 7:00h: call [phone]  10/10/2013 5:45 PM

## 2013-10-10 NOTE — Patient Instructions (Signed)
Cough is multifactorial For sinus   START generic fluticasone inhaler 2 squirts each nostril daily AM  START  nasal 2.7% saline nasal spray 2 squirts each nostril daily at night For acid reflux  START prilosec 20mg daily AM empthy stomach For airways disease/copd  - contninue dulera and mucinex  - talk to eye doctor and stop timolol  - at followup need to explore reasons for refusing spiriva  - if dulera not working well at following, consider Anoro + QVAR  For resolving pneumonia  - repeat CT chest mid dec 2014 Followup  1 month to see NP or me  dpeending on how cough is, consider switch to anoro +/- neurontin and speech therapy with Carl Schinke 

## 2013-10-10 NOTE — Progress Notes (Signed)
Subjective:    Patient ID: [name], female    DOB: 1941-03-06, 72 y.o.   MRN: [id number]  HPI   Problem List 1. Ex Heavy Smoker 2. 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 Refuses spiriva, flu shot, rehab - In 2014: MDI is dulera 3. AECOPD hx - Aug 2011  (office Rx)   OV 06/12/2013    Admitted earlier in [place] for 2 days with worsening cough and yellow sputum for  1 months and dx with LL PNAand AECOPD. Dc on abx and pred taper and improved but cough worsened again. 3 days ago startred on levaquin and pred taper (few week taper) by [name], MD. Feeling fatigued overall. COPD CAT score is 29. Can make bed and overall fatigued  REC Have CXR today; will call with result. - > CLEARED Finish levaquin and prednisone ordered by [name], MD Return to see me or NP [name]  in 3-4 weeks with COPD CAT Score at followup  07/10/2013 Follow up   [name], after a left lower lobe pneumonia. She finished her antibiotics with Levaquin. Chest x-ray showed a clearance of her left lower lobe infiltrate. Patient states that she is improved with decreased shortness, of breath, and congestion. However, continues to have a persistent dry cough. She is requesting a cough syrup. She is taking Mucinex DM without much relief. She is also use Tessalon the past, but has been intolerant to this. Patient denies any hemoptysis, orthopnea, PND, or increased leg swelling. Unfortunately, patient continues to smoke. Smoking cessation was discussed in detail.  CAT score 26 today .   REC Must quit smoking .  Mucinex DM Twice daily As needed Cough/congestion  Continue on Dulera 2 puffs Twice daily  May use Hydromet 1/2-1 tsp every 8hr As needed Cough , may make you sleepy (do not use with other pain meds )  follow up Dr. [name] in 2-3 months and As needed  Please contact office for  sooner follow up if symptoms do not improve or worsen or seek emergency care      OV 09/09/2013 Had acute  shortness of breath and was taken to the emergency department 07/22/2013. Blood work reviewed and showed normal chemistry and normal CBC. Is no troponin check. A chest x-ray showed possible left pleural effusion but otherwise hyperinflated chest. However to me the chest x-ray shows either a small effusion or left lower lobe atelectasis which is similar to July 2014 and improved from the one prior to that and she had a pneumonia diagnoses in early July 2014. I do note that she's never had a CT scan of the chest. Patient was discharged 10 days of Levaquin and a prednisone taper which he still on. SHe continues to be symptomatiuc wit CAT Scoire 26   Of note she continues to smoke   OV 09/09/2013  She presents with her grand daughter for followup. She had CT scan of the chest in 07/31/2013 that shows only right lower lobe resolving pneumonia. She still continues to have severe cough. There is a baseline element to it with periodic exacerbations. [name] is not completely. Cough is most dry. There is associated sinus drainage and ticklish sensation in the throat and she does not treat this. There is associated acid reflux. She is noted to be on beta blocker eyedrops but  the granddaughter says she's not compliant with this and does not think this is making her cough worse. Is only an occasional gag  Otherwise COPD is at baseline    OV 10/10/2013     CAT COPD Symptom & Quality of Life Score (GSK trademark) 0 is no burden. 5 is highest burden 06/12/2013  09/09/2013  10/10/2013   Never Cough -> Cough all the time 4 5   No phlegm in chest -> Chest is full of phlegm 3 2   No chest tightness -> Chest feels very tight 2 0   No dyspnea for 1 flight stairs/hill -> Very dyspneic for 1 flight of stairs 4 5   No limitations for ADL at home -> Very limited with  ADL at home 3 3   Confident leaving home -> Not at all confident leaving home 3 2   Sleep soundly -> Do not sleep soundly because of lung condition 5 4   Lots of Energy -> No energy at all 5 5   TOTAL Score (max 40)  29 26      Review of Systems  Constitutional: Negative for fever and unexpected weight change.  HENT: Negative for congestion, dental problem, ear pain, nosebleeds, postnasal drip, rhinorrhea, sinus pressure, sneezing, sore throat and trouble swallowing.   Eyes: Negative for redness and itching.  Respiratory: Positive for cough, chest tightness, shortness of breath and wheezing.   Cardiovascular: Negative for palpitations and leg swelling.  Gastrointestinal: Negative for nausea and vomiting.  Genitourinary: Negative for dysuria.  Musculoskeletal: Negative for joint swelling.  Skin: Negative for rash.  Neurological: Negative for headaches.  Hematological: Does not bruise/bleed easily.  Psychiatric/Behavioral: Negative for dysphoric mood. The patient is not nervous/anxious.        Objective:   Physical Exam  Filed Vitals:   10/10/13 1152  BP: 140/80  Pulse: 98  Temp: 97.3 F (36.3 C)  TempSrc: Oral  Height: 5\' 5"  (1.651 m)  Weight: 150 lb 6.4 oz (68.221 kg)  SpO2: 88%         Assessment & Plan:

## 2013-10-11 DIAGNOSIS — E119 Type 2 diabetes mellitus without complications: Secondary | ICD-10-CM

## 2013-10-11 DIAGNOSIS — J9600 Acute respiratory failure, unspecified whether with hypoxia or hypercapnia: Secondary | ICD-10-CM

## 2013-10-11 DIAGNOSIS — R0602 Shortness of breath: Secondary | ICD-10-CM

## 2013-10-11 LAB — GLUCOSE, CAPILLARY: Glucose-Capillary: 309 mg/dL — ABNORMAL HIGH (ref 70–99)

## 2013-10-11 LAB — CBC
Hemoglobin: 15 g/dL (ref 12.0–15.0)
MCH: 31.7 pg (ref 26.0–34.0)
MCHC: 33.9 g/dL (ref 30.0–36.0)
MCV: 93.4 fL (ref 78.0–100.0)
Platelets: 150 10*3/uL (ref 150–400)
RBC: 4.73 MIL/uL (ref 3.87–5.11)
WBC: 8.1 10*3/uL (ref 4.0–10.5)

## 2013-10-11 LAB — BASIC METABOLIC PANEL
BUN: 23 mg/dL (ref 6–23)
CO2: 27 mEq/L (ref 19–32)
Calcium: 9.4 mg/dL (ref 8.4–10.5)
Chloride: 96 mEq/L (ref 96–112)
Potassium: 3.9 mEq/L (ref 3.5–5.1)
Sodium: 134 mEq/L — ABNORMAL LOW (ref 135–145)

## 2013-10-11 LAB — TROPONIN I: Troponin I: <0.3 ng/mL (ref <0.30)

## 2013-10-11 MED ORDER — SUMATRIPTAN SUCCINATE 6 MG/0.5ML ~~LOC~~ SOLN
6.0000 mg | Freq: Once | SUBCUTANEOUS | Status: DC
  Filled 2013-10-11: qty 0.5

## 2013-10-11 MED ORDER — HYDROCODONE-ACETAMINOPHEN 5-325 MG PO TABS
1.0000 | ORAL_TABLET | Freq: Once | ORAL | Status: AC
  Administered 2013-10-11: 1 via ORAL
  Filled 2013-10-11: qty 1

## 2013-10-11 MED ORDER — FUROSEMIDE 10 MG/ML IJ SOLN
20.0000 mg | Freq: Once | INTRAMUSCULAR | Status: AC
  Administered 2013-10-11: 20 mg via INTRAVENOUS
  Filled 2013-10-11: qty 2

## 2013-10-11 MED ORDER — TRAMADOL HCL 50 MG PO TABS
50.0000 mg | ORAL_TABLET | Freq: Once | ORAL | Status: AC
  Administered 2013-10-11: 50 mg via ORAL
  Filled 2013-10-11: qty 1

## 2013-10-11 NOTE — H&P (Signed)
PULMONARY  / CRITICAL CARE MEDICINE  Name: [name] MRN: [id number] DOB: 1941-02-06    ADMISSION DATE:  10/10/2013  REFERRING MD :  [name], MD  PRIMARY SERVICE: PCCM   CHIEF COMPLAINT:  wprsening cough and wheeze and dyspnea, recurrent episodes  LINES / TUBES: None  CULTURES: Results for orders placed during the hospital encounter of 05/20/13  CULTURE, BLOOD (ROUTINE X 2)     Status: None   Collection Time    05/20/13 10:50 AM      Result Value Range Status   Specimen Description BLOOD RIGHT ARM   Final   Special Requests BOTTLES DRAWN AEROBIC AND ANAEROBIC 10CC   Final   Culture  Setup Time 05/20/2013 13:26   Final   Culture NO GROWTH 5 DAYS   Final   Report Status 05/26/2013 FINAL   Final  CULTURE, BLOOD (ROUTINE X 2)     Status: None   Collection Time    05/20/13 11:00 AM      Result Value Range Status   Specimen Description BLOOD LEFT ARM   Final   Special Requests BOTTLES DRAWN AEROBIC AND ANAEROBIC 10CC   Final   Culture  Setup Time 05/20/2013 13:26   Final   Culture NO GROWTH 5 DAYS   Final   Report Status 05/26/2013 FINAL   Final     ANTIBIOTICS: Anti-infectives   Start     Dose/Rate Route Frequency Ordered Stop   10/10/13 1500  doxycycline (VIBRAMYCIN) 100 mg in dextrose 5 % 250 mL IVPB     100 mg 125 mL/hr over 120 Minutes Intravenous Every 12 hours 10/10/13 1337         BACKGROUND #Active smoker: quit apparently 3 days ago    #. 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 Refuses spiriva, flu shot, rehab  - In 2014: MDI is dulera   # REcurrent . AECOPD hx     -  Left lower lobe pneumonia and acute exacerbation COPD July 2014 with subsequent resolution on chest x-ray in July 2014. Rx levaquin  - ER visit 07/22/2013 COPD exacerbation  - Right lower lobe pneumonia 07/31/2013  #Pat medical   has a past medical history of CAD (coronary artery disease); Ischemic cardiomyopathy;  Diabetes mellitus; HTN (hypertension); Hyperlipidemia; Diverticulosis; Asthma; COPD (chronic obstructive pulmonary disease); Small bowel obstruction; Nephrolithiasis; Sleep apnea; Overweight(278.02); Osteoarthritis; GERD (gastroesophageal reflux disease); Anxiety; Glaucoma; History of colonoscopy; and Memory deficit (04/23/2013).   BRIEF  Brought in by the son in law 10/10/13. Was in the emergency room 09/22/2013 and was discharged with Levaquin and prednisone for a diagnosis COPD exacerbation. Apparently this helped symptoms but as soon as the course was over in 5 days she started having worsening shortness of breath cough and wheeze along with increased sputum volume. She does not know the sputum color because she swallows it or is difficult to come up. She then saw her primary care physician 4 days ago but due to her recurrent nature of COPD exacerbation she was asked to followup here and she presented to the office 10/10/2013. In the office she was found to be saturating 88% on room air at rest which is below her baseline. She was admitted to worsening orthopnea and worsening pedal edema compared to baseline. Not clear if there is a fever. COPD cat score significantly worse than baseline  She and her son-in-law her extreme concern about recurrent respiratory exacerbations  She is believed to still smoke   CAT COPD Symptom & Quality of Life Score (GSK trademark)  0 is no burden. 5 is highest burden  06/12/2013  09/09/2013  10/10/2013   Never Cough -> Cough all the time  4  5  5   No phlegm in chest -> Chest is full of phlegm  3  2  5   No chest tightness -> Chest feels very tight  2  0  4  No dyspnea for 1 flight stairs/hill -> Very dyspneic for 1 flight of stairs  4  5  5   No limitations for ADL at home -> Very limited with ADL at home  3  3  4   Confident leaving home -> Not at all confident leaving home  3  2  3   Sleep soundly -> Do not sleep soundly because of lung condition  5  4  4   Lots of  Energy -> No energy at all  5  5  5   TOTAL Score (max 40)  29  26  35      SUBJECTIVE:   10/11/13:  Better resp status but having migrane Rt headache;no relief wit tylenol. She is open to trying a triptan despite her cardiac risk  VITAL SIGNS: Temp:  [97.3 F (36.3 C)-98 F (36.7 C)] 98 F (36.7 C) (11/22 0500) Pulse Rate:  [80-98] 89 (11/22 0500) Resp:  [18-22] 18 (11/22 0500) BP: (131-154)/(64-80) 131/70 mmHg (11/22 0500) SpO2:  [88 %-97 %] 97 % (11/22 0941) FiO2 (%):  [21 %] 21 % (11/21 1339) Weight:  [65.5 kg (144 lb 6.4 oz)-68.221 kg (150 lb 6.4 oz)] 65.5 kg (144 lb 6.4 oz) (11/22 0500) HEMODYNAMICS:   VENTILATOR SETTINGS: Vent Mode:  [-]  FiO2 (%):  [21 %] 21 % INTAKE / OUTPUT: Intake/Output     11/21 0701 - 11/22 0700 11/22 0701 - 11/23 0700   P.O. 480    I.V. (mL/kg) 3 (0)    IV Piggyback 550    Total Intake(mL/kg) 1033 (15.8)    Urine (mL/kg/hr) 2200 100 (0.5)   Total Output 2200 100   Net -1167 -100        Urine Occurrence 1 x      PHYSICAL EXAMINATION: General:  Elderly female lying in bed. Looks remarkably better Neuro:  Alert and oriented x3. Speech is normal. HEENT:  Supple neck. No neck nodes Cardiovascular:  Regular rate and rhythm Lungs:  Barrel chest. Some scattered wheeze not a respirator stress Abdomen:  Soft nontender no organomegaly Musculoskeletal:  Mild pedal edema worse than baseline Skin:  Appears intact  LABS:  CBC  Recent Labs Lab 10/10/13 1431 10/11/13 0200  WBC 10.3 8.1  HGB 14.8 15.0  HCT 44.4 44.2  PLT 147* 150   Coag's No results found for this basename: APTT, INR,  in the last 168 hours BMET  Recent Labs Lab 10/10/13 1431 10/11/13 0200  NA 140 134*  K 4.5 3.9  CL 104 96  CO2 24 27  BUN 15 23  CREATININE 0.62 0.74  GLUCOSE 99 243*   Electrolytes  Recent Labs Lab 10/10/13 1431 10/11/13 0200  CALCIUM 9.5 9.4  MG 1.4*  --   PHOS 3.6  --    Sepsis Markers  Recent Labs Lab 10/10/13 1431  10/10/13 1432 10/10/13 1940  LATICACIDVEN  --  2.7* 2.1  PROCALCITON <0.10  --   --    ABG  Recent Labs Lab 10/10/13 1352  PHART 7.350  PCO2ART 46.7*  PO2ART 92.3   Liver Enzymes  Recent Labs Lab 10/10/13 1431  AST 13  ALT 9  ALKPHOS 70  BILITOT 0.3  ALBUMIN 3.6   Cardiac Enzymes  Recent Labs Lab 10/10/13 1431 10/10/13 1432 10/10/13 1940 10/11/13 0200  TROPONINI <0.30  --  <0.30 <0.30  PROBNP  --  776.6*  --   --    Glucose  Recent Labs Lab 10/10/13 1701 10/10/13 2100 10/11/13 0740  GLUCAP 132* 203* 243*    Imaging X-ray Chest Pa And Lateral   10/10/2013   CLINICAL DATA:  Cough, shortness breath, weakness  EXAM: CHEST  2 VIEW  COMPARISON:  09/22/2013  FINDINGS: Lungs are essentially clear. No focal consolidation. No pleural effusion or pneumothorax.  Mild cardiomegaly.  Mild degenerative changes of the visualized thoracolumbar spine.  IMPRESSION: No evidence of acute cardiopulmonary disease.   Electronically Signed   By: Charline Bills M.D.   On: 10/10/2013 15:42     ASSESSMENT / PLAN:  PULMONARY A:Likely recurrent AECOPD - uncelar why, ? Smoking, ? Aspiration, ? Compliance  10/11/13: Markedly better. She says she lives with niece who is Charity fundraiser and very diligent in giving her meds.so compliance might not be an issue. There are 2 cats ? Allergen issues at home vs repeated levaquin at all admits  P:   continue Bronchodilators Steroids Doxy CT chest in dec 2014 as followup DUplex LE ro DVT  CARDIOVASCULAR A: Likely diastolic Acute CHF. Trop negative. No arrythmia  P:  diuresise empiric x again 10/11/13 Echo Dc tele  RENAL A:  Normal P:   Replete lytes as needed  GASTROINTESTINAL A:  Nil acute P:   Monitor Carb modofied diet  HEMATOLOGIC A:  Nil acute P:  monitor  INFECTIOUS A:  AT risk for HCAP. Too many times has had levaquin in psat 90 days P:   Rx with doxy  ENDOCRINE A:  DM   P:   ssi  NEUROLOGIC A:  Intact but  c/po headache P:   aboid triptan as much as possible norco x 1 Ultram x 1 monitopr  CODE  Full code     Dr. Kalman Shan, M.D., [facility].C.P Pulmonary and Critical Care Medicine Staff Physician  System [facility] Pager: [phone], If no answer or between  15:00h - 7:00h: call [phone]  10/11/2013 10:10 AM

## 2013-10-11 NOTE — Progress Notes (Signed)
VASCULAR LAB PRELIMINARY  PRELIMINARY  PRELIMINARY  PRELIMINARY  Bilateral lower extremity venous Dopplers completed.    Preliminary report:  There is no DVT or SVT noted in the bilateral lower extremities.  Glenmore Karl, RVT 10/11/2013, 5:29 PM

## 2013-10-12 ENCOUNTER — Encounter (HOSPITAL_COMMUNITY): Payer: Self-pay | Admitting: Internal Medicine

## 2013-10-12 DIAGNOSIS — I5021 Acute systolic (congestive) heart failure: Secondary | ICD-10-CM

## 2013-10-12 DIAGNOSIS — I2589 Other forms of chronic ischemic heart disease: Secondary | ICD-10-CM

## 2013-10-12 DIAGNOSIS — I25810 Atherosclerosis of coronary artery bypass graft(s) without angina pectoris: Secondary | ICD-10-CM

## 2013-10-12 LAB — GLUCOSE, CAPILLARY
Glucose-Capillary: 354 mg/dL — ABNORMAL HIGH (ref 70–99)
Glucose-Capillary: 236 mg/dL — ABNORMAL HIGH (ref 70–99)

## 2013-10-12 LAB — CBC WITH DIFFERENTIAL/PLATELET
Basophils Absolute: 0 10*3/uL (ref 0.0–0.1)
Basophils Relative: 0 % (ref 0–1)
Eosinophils Absolute: 0 10*3/uL (ref 0.0–0.7)
HCT: 42.8 % (ref 36.0–46.0)
Lymphocytes Relative: 9 % — ABNORMAL LOW (ref 12–46)
Lymphs Abs: 1.4 10*3/uL (ref 0.7–4.0)
MCH: 31.6 pg (ref 26.0–34.0)
MCHC: 34.1 g/dL (ref 30.0–36.0)
Monocytes Absolute: 0.3 10*3/uL (ref 0.1–1.0)
Monocytes Relative: 2 % — ABNORMAL LOW (ref 3–12)
Neutro Abs: 14.2 10*3/uL — ABNORMAL HIGH (ref 1.7–7.7)
Neutrophils Relative %: 89 % — ABNORMAL HIGH (ref 43–77)
RBC: 4.62 MIL/uL (ref 3.87–5.11)
RDW: 12.8 % (ref 11.5–15.5)

## 2013-10-12 LAB — BASIC METABOLIC PANEL
CO2: 22 mEq/L (ref 19–32)
Glucose, Bld: 384 mg/dL — ABNORMAL HIGH (ref 70–99)
Potassium: 4.5 mEq/L (ref 3.5–5.1)
Sodium: 132 mEq/L — ABNORMAL LOW (ref 135–145)

## 2013-10-12 LAB — PRO B NATRIURETIC PEPTIDE: Pro B Natriuretic peptide (BNP): 632.7 pg/mL — ABNORMAL HIGH (ref 0–125)

## 2013-10-12 LAB — PHOSPHORUS: Phosphorus: 3.5 mg/dL (ref 2.3–4.6)

## 2013-10-12 LAB — MAGNESIUM: Magnesium: 1.8 mg/dL (ref 1.5–2.5)

## 2013-10-12 MED ORDER — FUROSEMIDE 10 MG/ML IJ SOLN
20.0000 mg | Freq: Once | INTRAMUSCULAR | Status: AC
  Administered 2013-10-12: 20 mg via INTRAVENOUS
  Filled 2013-10-12: qty 2

## 2013-10-12 MED ORDER — ISOSORBIDE MONONITRATE ER 30 MG PO TB24
30.0000 mg | ORAL_TABLET | Freq: Every day | ORAL | Status: DC
  Administered 2013-10-12 – 2013-10-14 (×3): 30 mg via ORAL
  Filled 2013-10-12 (×3): qty 1

## 2013-10-12 MED ORDER — METOPROLOL SUCCINATE ER 25 MG PO TB24
25.0000 mg | ORAL_TABLET | Freq: Every day | ORAL | Status: DC
  Administered 2013-10-12 – 2013-10-14 (×3): 25 mg via ORAL
  Filled 2013-10-12 (×3): qty 1

## 2013-10-12 MED ORDER — MAGNESIUM SULFATE IN D5W 10-5 MG/ML-% IV SOLN
1.0000 g | Freq: Once | INTRAVENOUS | Status: AC
  Administered 2013-10-12: 1 g via INTRAVENOUS
  Filled 2013-10-12: qty 100

## 2013-10-12 MED ORDER — DOXYCYCLINE HYCLATE 100 MG PO TABS
100.0000 mg | ORAL_TABLET | Freq: Two times a day (BID) | ORAL | Status: DC
  Administered 2013-10-12 – 2013-10-14 (×5): 100 mg via ORAL
  Filled 2013-10-12 (×6): qty 1

## 2013-10-12 MED ORDER — ASPIRIN 81 MG PO CHEW
81.0000 mg | CHEWABLE_TABLET | Freq: Every day | ORAL | Status: DC
  Administered 2013-10-12 – 2013-10-14 (×3): 81 mg via ORAL
  Filled 2013-10-12 (×3): qty 1

## 2013-10-12 MED ORDER — SPIRONOLACTONE 12.5 MG HALF TABLET
12.5000 mg | ORAL_TABLET | Freq: Every day | ORAL | Status: DC
  Administered 2013-10-12 – 2013-10-14 (×3): 12.5 mg via ORAL
  Filled 2013-10-12 (×3): qty 1

## 2013-10-12 MED ORDER — PREDNISONE 20 MG PO TABS
40.0000 mg | ORAL_TABLET | Freq: Every day | ORAL | Status: DC
  Administered 2013-10-13 – 2013-10-14 (×2): 40 mg via ORAL
  Filled 2013-10-12 (×3): qty 2

## 2013-10-12 NOTE — Consult Note (Signed)
/   ELECTROPHYSIOLOGY CONSULT NOTE  Patient ID: [name], MRN: [id number], DOB/AGE: Jun 03, 1941 72 y.o. Admit date: 10/10/2013 Date of Consult: 10/12/2013  Primary Physician: [name], MD Primary Cardiologist: CM  Chief Complaint: shortness of breath   HPI [name] is a 72 y.o. female  With known ICM with prior intervention of RCA  2012 and   EF 45% with subsequent EF 5/12 EF normal  admitted with shortness of breath coughing and wheeze with outpatient therapies over the preceding weeks for shortness of breath thought secondary to COPD exacerbation. She apparently to Dr. MR that she had worsening Orthopnea and edema. She tells me that she has chronic orthopnea and asymmetric edema.  She has noted over recent months a significant change in her exercise tolerance. (She mentions though that she is not certain about the specifics of her memory is not very good anymore. I asked her she ever had a prior stroke and she said may be because my memory is getting worse.) She denies associated discomfort in her arms or chest. She does not recall symptoms that she had prior to her coronary revascularization.   Rx with empiric ABx, steroids, diuretics and bronchodilators with interval improvement   Echo EF>>30-35% and Inferior WMA  She is not on ACE inhibitors because of a respiratory arrest to which they were attributed-January 2009: She has been on low-dose metoprolol tartrate.  She also has a history of palpitations for which she received a 21 day event recorder 12/13 which was unrevealing    Past Medical History  Diagnosis Date  . CAD (coronary artery disease)     s/p inferior STEMI 12/29/10 with rotablator atherectomy RCA 12/31/10 and DES x 2 RCA  . Ischemic cardiomyopathy     EF 45% by cath 12/29/10.   . Diabetes mellitus     type 2  . HTN (hypertension)   . Hyperlipidemia   . Diverticulosis   . Asthma   . COPD (chronic obstructive pulmonary disease)   . Small  bowel obstruction     from a sigmoid stricture  . Nephrolithiasis   . Sleep apnea   . Overweight(278.02)   . Osteoarthritis   . GERD (gastroesophageal reflux disease)   . Anxiety   . Glaucoma   . History of colonoscopy   . Memory deficit 04/23/2013      Surgical History:  Past Surgical History  Procedure Laterality Date  . Bilateral tubal ligation    . Lithotripsy    . 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 Cardiac surgery       Home Meds: Prior to Admission medications   Medication Sig Start Date End Date Taking? Authorizing Provider  albuterol (PROVENTIL HFA;VENTOLIN HFA) 108 (90 BASE) MCG/ACT inhaler Inhale 2 puffs into the lungs every 6 (six) hours as needed for wheezing.   Yes Historical Provider, MD  albuterol (PROVENTIL) (2.5 MG/3ML) 0.083% nebulizer solution Take 2.5 mg by nebulization every 6 (six) hours as needed for wheezing or shortness of breath.    Yes Historical Provider, MD  aspirin 325 MG tablet Take 325 mg by mouth every morning.    Yes Historical Provider, MD  clopidogrel (PLAVIX) 75 MG tablet Take 75 mg by mouth every morning.   Yes Historical Provider, MD  Guaifenesin 1200 MG TB12 Take 1 tablet (1,200 mg total) by mouth 2 (two) times daily. 08/11/13  Yes Jamesetta Orleans Lawyer, PA-C  HYDROcodone-acetaminophen (HYCET) 7.5-325 mg/15 ml solution Take 15  mLs by mouth every 6 (six) hours as needed for cough. 09/22/13  Yes Nicole Pisciotta, PA-C  hydrOXYzine (ATARAX/VISTARIL) 25 MG tablet Take 12.5-25 mg by mouth every 6 (six) hours as needed for itching.    Yes Historical Provider, MD  ibuprofen (ADVIL,MOTRIN) 200 MG tablet Take 400 mg by mouth every 6 (six) hours as needed.   Yes Historical Provider, MD  insulin aspart (NOVOLOG) 100 unit/ml SOLN Inject 5 Units into the skin 3 (three) times daily with meals.  05/22/13  Yes Vassie Loll, MD  insulin glargine (LANTUS) 100 UNIT/ML injection Inject 12 Units into the skin daily.   Yes Historical  Provider, MD  JENTADUETO 2.03-999 MG TABS Take 1 tablet by mouth 2 (two) times daily.  08/16/11  Yes Historical Provider, MD  levofloxacin (LEVAQUIN) 500 MG tablet Take 500 mg by mouth daily.   Yes Historical Provider, MD  LORazepam (ATIVAN) 0.5 MG tablet Take 0.5 mg by mouth 2 (two) times daily as needed for anxiety.   Yes Historical Provider, MD  metoprolol tartrate (LOPRESSOR) 25 MG tablet Take 0.5 tablets (12.5 mg total) by mouth 2 (two) times daily. 06/18/13  Yes Kathleen Hazel, MD  mometasone-formoterol [facility]) 200-5 MCG/ACT AERO Inhale 2 puffs into the lungs 2 (two) times daily.   Yes Historical Provider, MD  oxyCODONE-acetaminophen (PERCOCET/ROXICET) 5-325 MG per tablet Take 1 tablet by mouth every 6 (six) hours as needed for pain.    Yes Historical Provider, MD  PARoxetine (PAXIL) 10 MG tablet Take 10 mg by mouth daily.   Yes Historical Provider, MD  pravastatin (PRAVACHOL) 40 MG tablet Take 1 tablet (40 mg total) by mouth every evening. 06/18/13  Yes Kathleen Hazel, MD  pregabalin (LYRICA) 75 MG capsule Take 75 mg by mouth 2 (two) times daily.     Yes Historical Provider, MD  promethazine-dextromethorphan (PROMETHAZINE-DM) 6.25-15 MG/5ML syrup Take 5 mLs by mouth every 4 (four) hours as needed for cough. 08/11/13  Yes Jamesetta Orleans Lawyer, PA-C  rivastigmine (EXELON) 9.5 mg/24hr Place 1 patch onto the skin every morning.    Yes Historical Provider, MD  tolterodine (DETROL LA) 4 MG 24 hr capsule Take 4 mg by mouth every morning.    Yes Historical Provider, MD  nitroGLYCERIN (NITROSTAT) 0.4 MG SL tablet Place 1 tablet (0.4 mg total) under the tongue every 5 (five) minutes as needed. For chest pain. 06/18/13   Kathleen Hazel, MD    Inpatient Medications:  . albuterol  2.5 mg Nebulization Q6H  . aspirin  325 mg Oral Daily  . clopidogrel  75 mg Oral Q breakfast  . doxycycline  100 mg Oral Q12H  . fesoterodine  8 mg Oral Daily  . furosemide  20 mg Intravenous Once  .  guaiFENesin  1,200 mg Oral BID  . heparin  5,000 Units Subcutaneous Q8H  . insulin aspart  0-20 Units Subcutaneous TID WC  . insulin glargine  12 Units Subcutaneous Daily  . ipratropium  0.5 mg Nebulization Q6H  . metoprolol tartrate  12.5 mg Oral BID  . mometasone-formoterol  2 puff Inhalation BID  . pantoprazole  40 mg Oral Daily  . [START ON 10/13/2013] predniSONE  40 mg Oral Q breakfast  . pregabalin  75 mg Oral BID  . rivastigmine  9.5 mg Transdermal q morning - 10a  . simvastatin  20 mg Oral q1800  . sodium chloride  3 mL Intravenous Q12H    and  Allergies:  Allergies  Allergen Reactions  .  Lisinopril     REACTION: respiratory arrest jan 2009  . Eggs Or Egg-Derived Products Diarrhea  . Erythromycin Other (See Comments)    Makes me feel weird   . Iodine     Pt may be allergic to iodine, does not remember the details, refuses contrast-CS  . Penicillins Itching and Other (See Comments)    Too much as a kid  . Pentazocine Lactate Other (See Comments)    Reaction unknown  . Propoxyphene Hcl Other (See Comments)    Reaction unknown  . Quinapril Hcl Itching and Other (See Comments)    Too much   . Sulfonamide Derivatives Other (See Comments)    Childhood allergy    History   Social History  . Marital Status: Widowed    Spouse Name: N/A    Number of Children: N/A  . Years of Education: N/A   Occupational History  . Retired     Programmer, systems    Social History Main Topics  . Smoking status: Former Smoker -- 0.50 packs/day for 25 years    Types: Cigarettes    Quit date: 10/07/2013  . Smokeless tobacco: Never Used     Comment: over a ppd for 40+ years; quit 10/2004.  unsuccessfully tried eCigs.  . Alcohol Use: 0.5 oz/week    1 drink(s) per week     Comment: rare   . Drug Use: No  . Sexual Activity: No   Other Topics Concern  . Not on file   Social History Narrative  . No narrative on file     Family History  Problem Relation Age of Onset   . Breast cancer Maternal Grandmother   . Diabetes Father   . Heart disease Father   . Diabetes Sister   . Colon cancer Neg Hx      ROS:  Please see the history of present illness.     All other systems reviewed and negative.    Physical Exam:   Blood pressure 139/68, pulse 79, temperature 98.6 F (37 C), temperature source Oral, resp. rate 18, height 5\' 5"  (1.651 m), weight 144 lb 2.9 oz (65.4 kg), SpO2 96.00%. General: Well developed, well nourished female in no acute distress. Head: Normocephalic, atraumatic, sclera non-icteric, no xanthomas, nares are without discharge. EENT: normal Lymph Nodes:  none Back: without scoliosis/kyphosis , no CVA tendersness Neck: Negative for carotid bruits. JVD not elevated. Lungs: Clear bilaterally to auscultation without wheezes, rales, or rhonchi. Breathing is unlabored. Heart: RRR with S1 S2.  2/6 systolic murmur , rubs, or gallops appreciated. Abdomen: Soft, non-tender, non-distended with normoactive bowel sounds. No hepatomegaly. No rebound/guarding. No obvious abdominal masses. Msk:  Strength and tone appear normal for age. Extremities: No clubbing or cyanosis. No edema.  Distal pedal pulses are 2+ and equal bilaterally. Skin: Warm and Dry Neuro: Alert and oriented X 3. CN III-XII intact Grossly normal sensory and motor function . Psych:  Responds to questions appropriately with a normal affect.      Labs: Cardiac Enzymes  Recent Labs  10/10/13 1431 10/10/13 1940 10/11/13 0200  TROPONINI <0.30 <0.30 <0.30   CBC Lab Results  Component Value Date   WBC 15.9* 10/12/2013   HGB 14.6 10/12/2013   HCT 42.8 10/12/2013   MCV 92.6 10/12/2013   PLT 166 10/12/2013   PROTIME: No results found for this basename: LABPROT, INR,  in the last 72 hours Chemistry  Recent Labs Lab 10/10/13 1431  10/12/13 0436  NA 140  < >  132*  K 4.5  < > 4.5  CL 104  < > 95*  CO2 24  < > 22  BUN 15  < > 23  CREATININE 0.62  < > 0.66  CALCIUM 9.5  <  > 9.6  PROT 7.2  --   --   BILITOT 0.3  --   --   ALKPHOS 70  --   --   ALT 9  --   --   AST 13  --   --   GLUCOSE 99  < > 384*  < > = values in this interval not displayed. Lipids Lab Results  Component Value Date   CHOL 69 12/19/2012   HDL 31.30* 12/19/2012   LDLCALC 10 12/19/2012   TRIG 138.0 12/19/2012   BNP Pro B Natriuretic peptide (BNP)  Date/Time Value Range Status  10/12/2013  4:36 AM 632.7* 0 - 125 pg/mL Final  10/10/2013  2:32 PM 776.6* 0 - 125 pg/mL Final  09/22/2013  5:38 PM 529.8* 0 - 125 pg/mL Final  05/20/2013  5:40 AM 67.8  0 - 125 pg/mL Final   Miscellaneous Lab Results  Component Value Date   DDIMER 0.27 09/22/2013    Radiology/Studies:  X-ray Chest Pa And Lateral   10/10/2013   CLINICAL DATA:  Cough, shortness breath, weakness  EXAM: CHEST  2 VIEW  COMPARISON:  09/22/2013  FINDINGS: Lungs are essentially clear. No focal consolidation. No pleural effusion or pneumothorax.  Mild cardiomegaly.  Mild degenerative changes of the visualized thoracolumbar spine.  IMPRESSION: No evidence of acute cardiopulmonary disease.   Electronically Signed   By: Charline Bills M.D.   On: 10/10/2013 15:42   Dg Chest 2 View  09/22/2013   CLINICAL DATA:  Chest pain. Short of breath. Cough.  EXAM: CHEST  2 VIEW  COMPARISON:  08/11/2013  FINDINGS: The cardiac silhouette is mildly enlarged. The mediastinum is normal in contour. No hilar masses.  The lungs are hyperexpanded with flattened hemidiaphragms. The lungs are clear.  No pleural effusion or pneumothorax.  The bony thorax is demineralized but intact.  IMPRESSION: No acute cardiopulmonary disease.  COPD.   Electronically Signed   By: Amie Portland M.D.   On: 09/22/2013 17:50    EKG:  Sinus rhythm with poor R wave progression consistent with prior anteroseptal MI; these are notably not different since February 2014;  They were bnew at that time from 12/13   Assessment and Plan:    Ischemic cardiomyopathy with newly identified  worsening ejection fraction  Congestive heart failure associated with #1  History of palpitations without diagnosis  Cognitive decline  COPD  ACE inhibitor-->>respiratory arrest  The patient has a newly identified worsening of her ischemic cardiomyopathy accompanied by symptoms of exercise intolerance characterized by fatigue. There is no chest pain. On arrival to hospital her BNP was elevated and she is responded to broad based therapy including diuretics, steroids and antibiotics.  Would continue the aforementioned therapies. As relates to her cardiomyopathy, she is not a candidate for an ACE inhibitor given the respiratory arrest issue and given the severity of that episode would not add an ARB. She should be a candidate for aldosterone antagonist, and I started that. I've also change her beta blocker from short acting to long-acting metoprolol. Given the progressive exercise intolerance over recent months, I will also add low-dose long-acting nitrates. I think it is a reasonable consideration to undertake outpatient stress testing to see if there is anything specific that can  be addressed percutaneously, noting that stenting was previously not possible in the right coronary artery.  Troponin neg  i decreased ASA 325>>81   Her memory deficits while apparently revealed is not seen precluding of the aforementioned evaluation  I wonder whether her palpitations represent atrial fibrillation. With a prior history of stroke in her multiple thromboembolic risk factors implantable monitor might be of value.  Will follow with tyou       Sherryl Manges

## 2013-10-12 NOTE — Evaluation (Signed)
Physical Therapy Evaluation Patient Details Name: [name] MRN: [id number] DOB: 26-Feb-1941 Today's Date: 10/12/2013 Time: 7829-5621 PT Time Calculation (min): 20 min  PT Assessment / Plan / Recommendation History of Present Illness  per H& P: Brought in by the son in law 10/10/13. Was in the emergency room 09/22/2013 and was discharged with Levaquin and prednisone for a diagnosis COPD exacerbation. Apparently this helped symptoms but as soon as the course was over in 5 days she started having worsening shortness of breath cough and wheeze along with increased sputum volume. She does not know the sputum color because she swallows it or is difficult to come up. She then saw her primary care physician 4 days ago but due to her recurrent nature of COPD exacerbation she was asked to followup here and she presented to the office 10/10/2013. In the office she was found to be saturating 88% on room air at rest which is below her baseline. She was admitted to worsening orthopnea and worsening pedal edema compared to baseline. Not clear if there is a fever. COPD cat score significantly worse than baseline  Clinical Impression  Pt presents with decreased mobilitya dn activity tolerance, feel pt would benefit from PT for further education about proper progression, energy conservation, home exercise program and to increase activity ability to Independent.     PT Assessment  Patient needs continued PT services    Follow Up Recommendations  Home health PT    Does the patient have the potential to tolerate intense rehabilitation      Barriers to Discharge        Equipment Recommendations       Recommendations for Other Services     Frequency Min 3X/week    Precautions / Restrictions     Pertinent Vitals/Pain No c/o pain, 02 on RA 96% after walk, and HR 80 bpm.       Mobility  Bed Mobility Bed Mobility: Supine to Sit;Sit to Supine Supine to Sit: 4: Min guard Sit to Supine: Not Tested  (comment) Details for Bed Mobility Assistance: slow, time and sit EOB to steady herself Transfers Transfers: Sit to Stand;Stand to Sit Sit to Stand: 4: Min guard Stand to Sit: 4: Min guard Ambulation/Gait Ambulation/Gait Assistance: 4: Min guard Ambulation Distance (Feet): 100 Feet Assistive device: 1 person hand held assist Ambulation/Gait Assistance Details: slow, steady, slight SOB but was able to talk and walk during patrt of ambulation.  Gait Pattern: Within Functional Limits General Gait Details: on RA at 96% O2 sat and HR 80, 1/4 dyspnea. tolerated well.     Exercises     PT Diagnosis: Generalized weakness  PT Problem List: Decreased activity tolerance;Decreased mobility;Decreased balance PT Treatment Interventions: Gait training;DME instruction;Functional mobility training;Therapeutic activities;Therapeutic exercise;Patient/family education     PT Goals(Current goals can be found in the care plan section) Acute Rehab PT Goals Patient Stated Goal: To get home as soon as possible and get over this cougha nd pneamonia PT Goal Formulation: With patient Time For Goal Achievement: 10/26/13 Potential to Achieve Goals: Good  Visit Information  Last PT Received On: 10/12/13 Assistance Needed: +1 History of Present Illness: per H& P: Brought in by the son in law 10/10/13. Was in the emergency room 09/22/2013 and was discharged with Levaquin and prednisone for a diagnosis COPD exacerbation. Apparently this helped symptoms but as soon as the course was over in 5 days she started having worsening shortness of breath cough and wheeze along with increased sputum volume.  She does not know the sputum color because she swallows it or is difficult to come up. She then saw her primary care physician 4 days ago but due to her recurrent nature of COPD exacerbation she was asked to followup here and she presented to the office 10/10/2013. In the office she was found to be saturating 88% on room air  at rest which is below her baseline. She was admitted to worsening orthopnea and worsening pedal edema compared to baseline. Not clear if there is a fever. COPD cat score significantly worse than baseline       Prior Functioning  Home Living Family/patient expects to be discharged to:: Private residence Living Arrangements: Alone Available Help at Discharge: Family;Friend(s);Available 24 hours/day Type of Home: House Home Access: Stairs to enter [employer] of Steps: 1 Entrance Stairs-Rails: Right Home Layout: One level (apartment downstairs but she does not have to go down there. ) Home Equipment: Walker - 2 wheels (she thinks she has an old RW in attic) Prior Function Level of Independence: Independent Comments: not driving anymore, but independent within home.niece checks on her often and helps out when she can.  Communication Communication: No difficulties    Cognition  Cognition Arousal/Alertness: Awake/alert Behavior During Therapy: WFL for tasks assessed/performed Overall Cognitive Status: Within Functional Limits for tasks assessed    Extremity/Trunk Assessment Lower Extremity Assessment Lower Extremity Assessment: Overall WFL for tasks assessed   Balance    End of Session PT - End of Session Activity Tolerance: Patient tolerated treatment well Patient left: in chair;with nursing/sitter in room;with call bell/phone within reach Nurse Communication: Mobility status  GP     Marella Bile 10/12/2013, 2:20 PM Marella Bile, PT Pager: [phone] 10/12/2013

## 2013-10-12 NOTE — Progress Notes (Addendum)
PULMONARY  / CRITICAL CARE MEDICINE  Name: [name] MRN: [id number] DOB: 13-Apr-1941    ADMISSION DATE:  10/10/2013  REFERRING MD :  [name], MD Cards: Clifton James   PRIMARY SERVICE: PCCM   CHIEF COMPLAINT:  wprsening cough and wheeze and dyspnea, recurrent episodes  LINES / TUBES: None  CULTURES: Results for orders placed during the hospital encounter of 05/20/13  CULTURE, BLOOD (ROUTINE X 2)     Status: None   Collection Time    05/20/13 10:50 AM      Result Value Range Status   Specimen Description BLOOD RIGHT ARM   Final   Special Requests BOTTLES DRAWN AEROBIC AND ANAEROBIC 10CC   Final   Culture  Setup Time 05/20/2013 13:26   Final   Culture NO GROWTH 5 DAYS   Final   Report Status 05/26/2013 FINAL   Final  CULTURE, BLOOD (ROUTINE X 2)     Status: None   Collection Time    05/20/13 11:00 AM      Result Value Range Status   Specimen Description BLOOD LEFT ARM   Final   Special Requests BOTTLES DRAWN AEROBIC AND ANAEROBIC 10CC   Final   Culture  Setup Time 05/20/2013 13:26   Final   Culture NO GROWTH 5 DAYS   Final   Report Status 05/26/2013 FINAL   Final     ANTIBIOTICS: Anti-infectives   Start     Dose/Rate Route Frequency Ordered Stop   10/10/13 1500  doxycycline (VIBRAMYCIN) 100 mg in dextrose 5 % 250 mL IVPB     100 mg 125 mL/hr over 120 Minutes Intravenous Every 12 hours 10/10/13 1337         BACKGROUND #Active smoker: quit apparently 3 days ago    #. 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 Refuses spiriva, flu shot, rehab  - In 2014: MDI is dulera   # REcurrent . AECOPD hx     -  Left lower lobe pneumonia and acute exacerbation COPD July 2014 with subsequent resolution on chest x-ray in July 2014. Rx levaquin  - ER visit 07/22/2013 COPD exacerbation  - Right lower lobe pneumonia 07/31/2013  #Past medical   has a past medical history of CAD (coronary artery disease); Ischemic  cardiomyopathy; Diabetes mellitus; HTN (hypertension); Hyperlipidemia; Diverticulosis; Asthma; COPD (chronic obstructive pulmonary disease); Small bowel obstruction; Nephrolithiasis; Sleep apnea; Overweight(278.02); Osteoarthritis; GERD (gastroesophageal reflux disease); Anxiety; Glaucoma; History of colonoscopy; and Memory deficit (04/23/2013).   BRIEF  Brought in by the son in law 10/10/13. Was in the emergency room 09/22/2013 and was discharged with Levaquin and prednisone for a diagnosis COPD exacerbation. Apparently this helped symptoms but as soon as the course was over in 5 days she started having worsening shortness of breath cough and wheeze along with increased sputum volume. She does not know the sputum color because she swallows it or is difficult to come up. She then saw her primary care physician 4 days ago but due to her recurrent nature of COPD exacerbation she was asked to followup here and she presented to the office 10/10/2013. In the office she was found to be saturating 88% on room air at rest which is below her baseline. She was admitted to worsening orthopnea and worsening pedal edema compared to baseline. Not clear if there is a fever. COPD cat score significantly worse than baseline  She and her son-in-law her extreme concern about  recurrent respiratory exacerbations  She is believed to still smoke      SUBJECTIVE:   10/11/13:  Better resp status but having migrane Rt headache;no relief wit tylenol. She is open to trying a triptan despite her cardiac risk  VITAL SIGNS: Temp:  [97.3 F (36.3 C)-97.8 F (36.6 C)] 97.8 F (36.6 C) (11/23 0510) Pulse Rate:  [73-104] 73 (11/23 0510) Resp:  [18-20] 20 (11/23 0510) BP: (137-157)/(56-72) 137/56 mmHg (11/23 0510) SpO2:  [93 %-99 %] 93 % (11/23 0821) Weight:  [65.4 kg (144 lb 2.9 oz)] 65.4 kg (144 lb 2.9 oz) (11/23 0500) HEMODYNAMICS:   VENTILATOR SETTINGS:   INTAKE / OUTPUT: Intake/Output     11/22 0701 - 11/23 0700  11/23 0701 - 11/24 0700   P.O. 480 240   I.V. (mL/kg)     IV Piggyback 500    Total Intake(mL/kg) 980 (15) 240 (3.7)   Urine (mL/kg/hr) 1200 (0.8) 100 (0.3)   Total Output 1200 100   Net -220 +140        Urine Occurrence 3 x 2 x     PHYSICAL EXAMINATION: General:  Elderly female lying in bed. Looks remarkably better Neuro:  Alert and oriented x3. Speech is normal. HEENT:  Supple neck. No neck nodes Cardiovascular:  Regular rate and rhythm Lungs:  Barrel chest. Some scattered wheeze not a respirator stress Abdomen:  Soft nontender no organomegaly Musculoskeletal:  Mild pedal edema worse than baseline Skin:  Appears intact  LABS:  CBC  Recent Labs Lab 10/10/13 1431 10/11/13 0200 10/12/13 0646  WBC 10.3 8.1 15.9*  HGB 14.8 15.0 14.6  HCT 44.4 44.2 42.8  PLT 147* 150 166   Coag's No results found for this basename: APTT, INR,  in the last 168 hours BMET  Recent Labs Lab 10/10/13 1431 10/11/13 0200 10/12/13 0436  NA 140 134* 132*  K 4.5 3.9 4.5  CL 104 96 95*  CO2 24 27 22   BUN 15 23 23   CREATININE 0.62 0.74 0.66  GLUCOSE 99 243* 384*   Electrolytes  Recent Labs Lab 10/10/13 1431 10/11/13 0200 10/12/13 0436  CALCIUM 9.5 9.4 9.6  MG 1.4*  --  1.8  PHOS 3.6  --  3.5   Sepsis Markers  Recent Labs Lab 10/10/13 1431 10/10/13 1432 10/10/13 1940  LATICACIDVEN  --  2.7* 2.1  PROCALCITON <0.10  --   --    ABG  Recent Labs Lab 10/10/13 1352  PHART 7.350  PCO2ART 46.7*  PO2ART 92.3   Liver Enzymes  Recent Labs Lab 10/10/13 1431  AST 13  ALT 9  ALKPHOS 70  BILITOT 0.3  ALBUMIN 3.6   Cardiac Enzymes  Recent Labs Lab 10/10/13 1431 10/10/13 1432 10/10/13 1940 10/11/13 0200 10/12/13 0436  TROPONINI <0.30  --  <0.30 <0.30  --   PROBNP  --  776.6*  --   --  632.7*   Glucose  Recent Labs Lab 10/10/13 2100 10/11/13 0740 10/11/13 1131 10/11/13 1633 10/11/13 2148 10/12/13 0726  GLUCAP 203* 243* 298* 309* 363* 361*     Imaging X-ray Chest Pa And Lateral   10/10/2013   CLINICAL DATA:  Cough, shortness breath, weakness  EXAM: CHEST  2 VIEW  COMPARISON:  09/22/2013  FINDINGS: Lungs are essentially clear. No focal consolidation. No pleural effusion or pneumothorax.  Mild cardiomegaly.  Mild degenerative changes of the visualized thoracolumbar spine.  IMPRESSION: No evidence of acute cardiopulmonary disease.   Electronically Signed   By: Lurlean Horns  Rito Ehrlich M.D.   On: 10/10/2013 15:42     ASSESSMENT / PLAN:  PULMONARY A: At admit 10/10/2013 thought to have y recurrent AECOPD - uncelar why, ? Smoking, ? Aspiration (denis) ? Cat exposure at hoe  10/12/13:evidence suggests new  Onset systolic CHF. DUplex Neg DVT  P:   Chang IV steroids topo Bronchodilators to continue IV Doxy to po; total 5-6 days CT chest in dec 2014 as followup   CARDIOVASCULAR A:   Aacute systolic (new) and diastolic Acute CHF in setting of known ischemic CM. Trop negative. No arrythmia. RN found patient consuming high salt canned foods  P:  diuresise empiric x again 10/12/13 (3rd straight day) Cards consult Dietary consult   RENAL A:  Normal butmag slightly low P:   Replete mag  GASTROINTESTINAL A:  Nil acute P:   Monitor Carb modofied diet  HEMATOLOGIC A:  Nil acute P:  monitor  INFECTIOUS A:  AT risk for HCAP. Too many times has had levaquin in psat 90 days P:   Rx with doxy x5-6 days  ENDOCRINE A:  DM   P:   ssi  NEUROLOGIC A:  Intact but c/po headache. Improved with tylenol P:   aboid triptan as much as possible   CODE  Full code  GLOBAL 10/12/13: new onset sysolic CHF compared to 2012; call cards. Cx PT as well for home dc needs    Dr. Kalman Shan, M.D., [facility].C.P Pulmonary and Critical Care Medicine Staff Physician [facility] Pager: [phone], If no answer or between  15:00h - 7:00h: call [phone]  10/12/2013 12:31  PM

## 2013-10-13 LAB — CBC WITH DIFFERENTIAL/PLATELET
Basophils Relative: 0 % (ref 0–1)
Eosinophils Relative: 0 % (ref 0–5)
Hemoglobin: 13.6 g/dL (ref 12.0–15.0)
Lymphs Abs: 2.6 10*3/uL (ref 0.7–4.0)
MCH: 31.9 pg (ref 26.0–34.0)
MCV: 92.3 fL (ref 78.0–100.0)
Monocytes Absolute: 0.6 10*3/uL (ref 0.1–1.0)
Neutrophils Relative %: 80 % — ABNORMAL HIGH (ref 43–77)
Platelets: 156 10*3/uL (ref 150–400)
RBC: 4.26 MIL/uL (ref 3.87–5.11)
WBC: 16.2 10*3/uL — ABNORMAL HIGH (ref 4.0–10.5)

## 2013-10-13 LAB — GLUCOSE, CAPILLARY
Glucose-Capillary: 208 mg/dL — ABNORMAL HIGH (ref 70–99)
Glucose-Capillary: 237 mg/dL — ABNORMAL HIGH (ref 70–99)
Glucose-Capillary: 282 mg/dL — ABNORMAL HIGH (ref 70–99)

## 2013-10-13 LAB — BASIC METABOLIC PANEL
CO2: 27 mEq/L (ref 19–32)
Calcium: 9.6 mg/dL (ref 8.4–10.5)
Chloride: 100 mEq/L (ref 96–112)
Creatinine, Ser: 0.73 mg/dL (ref 0.50–1.10)
Glucose, Bld: 240 mg/dL — ABNORMAL HIGH (ref 70–99)
Sodium: 135 mEq/L (ref 135–145)

## 2013-10-13 LAB — PHOSPHORUS: Phosphorus: 4.1 mg/dL (ref 2.3–4.6)

## 2013-10-13 MED ORDER — INSULIN GLARGINE 100 UNIT/ML ~~LOC~~ SOLN
15.0000 [IU] | Freq: Every day | SUBCUTANEOUS | Status: DC
  Administered 2013-10-14: 15 [IU] via SUBCUTANEOUS
  Filled 2013-10-13: qty 0.15

## 2013-10-13 MED ORDER — MENTHOL 3 MG MT LOZG
1.0000 | LOZENGE | OROMUCOSAL | Status: DC | PRN
Start: 2013-10-13 — End: 2013-10-14
  Administered 2013-10-13: 3 mg via ORAL
  Filled 2013-10-13: qty 9

## 2013-10-13 MED ORDER — SENNOSIDES-DOCUSATE SODIUM 8.6-50 MG PO TABS
2.0000 | ORAL_TABLET | Freq: Two times a day (BID) | ORAL | Status: DC
  Administered 2013-10-13 – 2013-10-14 (×3): 2 via ORAL
  Filled 2013-10-13 (×4): qty 2

## 2013-10-13 NOTE — Progress Notes (Signed)
Patient complaining of sore throat. This RN noted some minimal redness with no swelling of throat. Spoke with Dr. [name]. Cepacol lozenges ordered. [name]

## 2013-10-13 NOTE — Progress Notes (Signed)
    Subjective:  Doing better this am. Breathing improved, still with cough. No chest pain.  Objective:  Vital Signs in the last 24 hours: Temp:  [97.7 F (36.5 C)-98.6 F (37 C)] 97.9 F (36.6 C) (11/24 0521) Pulse Rate:  [71-85] 71 (11/24 0521) Resp:  [16-20] 16 (11/24 0521) BP: (104-139)/(48-68) 104/62 mmHg (11/24 0521) SpO2:  [92 %-99 %] 99 % (11/24 0521) Weight:  [143 lb 14.4 oz (65.273 kg)] 143 lb 14.4 oz (65.273 kg) (11/24 0521)  Intake/Output from previous day: 11/23 0701 - 11/24 0700 In: 720 [P.O.:720] Out: 1000 [Urine:1000]  Physical Exam: Pt is alert and oriented, NAD HEENT: normal Neck: JVP - normal Lungs: scattered rhonchi CV: RRR without murmur or gallop Abd: soft, NT, Positive BS, no hepatomegaly Ext: no C/C/E, distal pulses intact and equal Skin: warm/dry no rash   Lab Results:  Recent Labs  10/12/13 0646 10/13/13 0435  WBC 15.9* 16.2*  HGB 14.6 13.6  PLT 166 156    Recent Labs  10/12/13 0436 10/13/13 0435  NA 132* 135  K 4.5 4.3  CL 95* 100  CO2 22 27  GLUCOSE 384* 240*  BUN 23 30*  CREATININE 0.66 0.73    Recent Labs  10/10/13 1940 10/11/13 0200  TROPONINI <0.30 <0.30    Cardiac Studies: 2D Echo: Study Conclusions  - Left ventricle: The cavity size was normal. Wall thickness was normal. Systolic function was moderately to severely reduced. The estimated ejection fraction was in the range of 30% to 35%. Diffuse hypokinesis. There is moderate hypokinesis of the inferior myocardium. Doppler parameters are consistent with abnormal left ventricular relaxation (grade 1 diastolic dysfunction). - Mitral valve: Mild regurgitation.  Assessment/Plan:  1. Acute on chronic systolic heart failure, improved 2. Ischemic cardiomyopathy with decline in LVEF 3. COPD exacerbation  Pt appears stable from cardiac perspective. Notes reviewed and aldactone has been added to her med regimen. Metoprolol changed to succinate form. She should  have BMET arranged within 2 weeks to monitor for hyperkalemia with addition of aldactone and will arrange follow-up with Dr Clifton James or his APP at that time.   Tonny Bollman, M.D. 10/13/2013, 7:17 AM

## 2013-10-13 NOTE — Progress Notes (Signed)
Inpatient Diabetes Program Recommendations  AACE/ADA: New Consensus Statement on Inpatient Glycemic Control (2013)  Target Ranges:  Prepandial:   less than 140 mg/dL      Peak postprandial:   less than 180 mg/dL (1-2 hours)      Critically ill patients:  140 - 180 mg/dL    Results for LUMINA, GITTO (MRN [id number]) as of 10/13/2013 11:29  Ref. Range 10/12/2013 07:26 10/12/2013 12:20 10/12/2013 17:33 10/12/2013 22:30  Glucose-Capillary Latest Range: 70-99 mg/dL 409 (H) 811 (H) 914 (H) 236 (H)    Results for AHRIA, SLAPPEY (MRN [id number]) as of 10/13/2013 11:29  Ref. Range 10/13/2013 07:34  Glucose-Capillary Latest Range: 70-99 mg/dL 086 (H)    **Noted IV Solumedrol d/c'd yesterday (last dose given yesterday AM at 6am).  Patient now getting PO Prednisone.  **MD- Please consider the following in-hospital insulin adjustments to help improve CBG control:  Inpatient Diabetes Program Recommendations Insulin - Basal: Please consider increasing Lantus to 15 units daily in the AM. Correction (SSI): Please add HS scale coverage to current SSI regimen. HgbA1C: Please check current A1c- Last A1c on file was 13% (04/03/13)   Will follow. Ambrose Finland RN, MSN, CDE Diabetes Coordinator Inpatient Diabetes Program Team Pager: [phone] (8a-10p)

## 2013-10-13 NOTE — Progress Notes (Signed)
Nutrition Education Note  RD consulted for nutrition education regarding a Heart Healthy diet.   Lipid Panel     Component Value Date/Time   CHOL 69 12/19/2012 1302   TRIG 138.0 12/19/2012 1302   HDL 31.30* 12/19/2012 1302   CHOLHDL 2 12/19/2012 1302   VLDL 27.6 12/19/2012 1302   LDLCALC 10 12/19/2012 1302    RD provided "Heart Healthy Nutrition Therapy" handout from the Academy of Nutrition and Dietetics. Reviewed patient's dietary recall. Provided examples on ways to decrease sodium and fat intake in diet. Discouraged intake of processed foods and use of salt shaker. Reviewed high sodium foods for pt to avoid. Encouraged fresh fruits and vegetables as well as whole grain sources of carbohydrates to maximize fiber intake. Teach back method used. Pt able to list five foods that she needs to limit/avoid and two foods that she would like to eat more of (fruits and whole grains).   Expect good compliance.   Body mass index is 23.95 kg/(m^2). Pt meets criteria for Normal Weight based on current BMI.  Current diet order is Carb Modified, patient is consuming approximately 100% of meals at this time. Labs and medications reviewed. No further nutrition interventions warranted at this time. RD contact information provided. If additional nutrition issues arise, please re-consult RD.  Ian Malkin RD, LDN Inpatient Clinical Dietitian Pager: [phone] After Hours Pager: [phone]

## 2013-10-13 NOTE — Progress Notes (Signed)
PULMONARY  / CRITICAL CARE MEDICINE  Name: [name] MRN: [id number] DOB: [birth year]    ADMISSION DATE:  10/10/2013  REFERRING MD :  PCCM PRIMARY SERVICE: PCCM   CHIEF COMPLAINT:  Progressive dyspnea  BRIEF PATIENT DESCRIPTION: 72 yo with COPD and CAD admitted with progressive dyspnea.  Workup revealed new systolic dysfunction.  EVENTS / STUDIES: 11/21  TTE >>>  EF 30-35%, diffuse hypokinesis, grade 1 diastolic dysfunction, mild MR  SUBJECTIVE:  No dyspnea while at rest breathing ambient air.  Complaints of constipation.  VITAL SIGNS: Temp:  [97.7 F (36.5 C)-98.6 F (37 C)] 97.9 F (36.6 C) (11/24 0521) Pulse Rate:  [71-85] 71 (11/24 0521) Resp:  [16-20] 16 (11/24 0521) BP: (104-139)/(48-68) 104/62 mmHg (11/24 0521) SpO2:  [92 %-99 %] 93 % (11/24 0847) Weight:  [65.273 kg (143 lb 14.4 oz)] 65.273 kg (143 lb 14.4 oz) (11/24 0521)  HEMODYNAMICS:   VENTILATOR SETTINGS:   INTAKE / OUTPUT: Intake/Output     11/23 0701 - 11/24 0700 11/24 0701 - 11/25 0700   P.O. 720    IV Piggyback     Total Intake(mL/kg) 720 (11)    Urine (mL/kg/hr) 1000 (0.6)    Total Output 1000     Net -280          Urine Occurrence 8 x    Stool Occurrence       PHYSICAL EXAMINATION: General:  Resting in the chair, no distress Neuro:  Awake, alert, cooperative HEENT:  No JVD Cardiovascular:  Regular rate and rhythm Lungs:  Bilateral air entry, few rhonchi clearing with cough Abdomen:  Soft nontender no organomegaly Musculoskeletal:  Trace pedal edema Skin:  No rash  LABS:  CBC  Recent Labs Lab 10/11/13 0200 10/12/13 0646 10/13/13 0435  WBC 8.1 15.9* 16.2*  HGB 15.0 14.6 13.6  HCT 44.2 42.8 39.3  PLT 150 166 156   Coag's No results found for this basename: APTT, INR,  in the last 168 hours BMET  Recent Labs Lab 10/11/13 0200 10/12/13 0436 10/13/13 0435  NA 134* 132* 135  K 3.9 4.5 4.3  CL 96 95* 100  CO2 27 22 27   BUN 23 23 30*  CREATININE 0.74 0.66 0.73   GLUCOSE 243* 384* 240*   Electrolytes  Recent Labs Lab 10/10/13 1431 10/11/13 0200 10/12/13 0436 10/13/13 0435  CALCIUM 9.5 9.4 9.6 9.6  MG 1.4*  --  1.8 2.0  PHOS 3.6  --  3.5 4.1   Sepsis Markers  Recent Labs Lab 10/10/13 1431 10/10/13 1432 10/10/13 1940  LATICACIDVEN  --  2.7* 2.1  PROCALCITON <0.10  --   --    ABG  Recent Labs Lab 10/10/13 1352  PHART 7.350  PCO2ART 46.7*  PO2ART 92.3   Liver Enzymes  Recent Labs Lab 10/10/13 1431  AST 13  ALT 9  ALKPHOS 70  BILITOT 0.3  ALBUMIN 3.6   Cardiac Enzymes  Recent Labs Lab 10/10/13 1431 10/10/13 1432 10/10/13 1940 10/11/13 0200 10/12/13 0436  TROPONINI <0.30  --  <0.30 <0.30  --   PROBNP  --  776.6*  --   --  632.7*   Glucose  Recent Labs Lab 10/12/13 0726 10/12/13 1220 10/12/13 1733 10/12/13 2230 10/13/13 0734 10/13/13 1152  GLUCAP 361* 354* 187* 236* 208* 237*   CXR:  None today  ASSESSMENT / PLAN:  AECOPD, resolving   -->  Albuterol / Atrovent  -->  Dulera  -->  Prednisone, complete 14 days  -->  Mucinex  CAD Ischemic cardiomyopathy Acute on chronic systolic / diastolic congestive heart failure Allergy to ACE   -->  Appreciate Cardiology recommendations  -->  ASA, Plavix, Zocor, Imdur, Metoprolol  -->  Lasix, Aldactone  -->  Outpatient stress test  DM Hyperglycemia, likely steroid induced    -->  SSI  -->  Increase Lantus to 15  Acute bronchitis Resolving bronchopneumonia Leukocytosis, in part steroid induced   -->  Doxycycline, complete 7 days  -->  CT chest in dec 2014 as follow up  Px   -->  Heparin Lozano  -->  Protonix  Likely d/c home in AM.  I have personally obtained history, examined patient, evaluated and interpreted laboratory and imaging results, reviewed medical records, formulated assessment / plan and placed orders.  Lonia Farber, MD Pulmonary and Critical Care Medicine [facility] Pager: [phone]  10/13/2013,  1:31 PM

## 2013-10-14 DIAGNOSIS — R059 Cough, unspecified: Secondary | ICD-10-CM

## 2013-10-14 DIAGNOSIS — R05 Cough: Secondary | ICD-10-CM

## 2013-10-14 LAB — URINE MICROSCOPIC-ADD ON

## 2013-10-14 LAB — CBC WITH DIFFERENTIAL/PLATELET
Basophils Absolute: 0 10*3/uL (ref 0.0–0.1)
Basophils Relative: 0 % (ref 0–1)
Eosinophils Absolute: 0 10*3/uL (ref 0.0–0.7)
Eosinophils Relative: 0 % (ref 0–5)
Hemoglobin: 13.4 g/dL (ref 12.0–15.0)
Lymphocytes Relative: 27 % (ref 12–46)
Lymphs Abs: 2.5 10*3/uL (ref 0.7–4.0)
MCHC: 33.6 g/dL (ref 30.0–36.0)
Monocytes Absolute: 0.6 10*3/uL (ref 0.1–1.0)
Neutrophils Relative %: 67 % (ref 43–77)
Platelets: 131 10*3/uL — ABNORMAL LOW (ref 150–400)
RDW: 13.2 % (ref 11.5–15.5)
WBC: 9.4 10*3/uL (ref 4.0–10.5)

## 2013-10-14 LAB — EXPECTORATED SPUTUM ASSESSMENT W REFEX TO RESP CULTURE

## 2013-10-14 LAB — BASIC METABOLIC PANEL
BUN: 29 mg/dL — ABNORMAL HIGH (ref 6–23)
CO2: 25 mEq/L (ref 19–32)
Calcium: 9.5 mg/dL (ref 8.4–10.5)
Creatinine, Ser: 0.69 mg/dL (ref 0.50–1.10)
GFR calc Af Amer: >90 mL/min (ref >90)
GFR calc non Af Amer: 86 mL/min — ABNORMAL LOW (ref >90)
Potassium: 3.8 mEq/L (ref 3.5–5.1)
Sodium: 136 mEq/L (ref 135–145)

## 2013-10-14 LAB — URINALYSIS, ROUTINE W REFLEX MICROSCOPIC
Glucose, UA: 100 mg/dL — AB
Hgb urine dipstick: NEGATIVE
Ketones, ur: NEGATIVE mg/dL
Protein, ur: NEGATIVE mg/dL
pH: 6 (ref 5.0–8.0)

## 2013-10-14 LAB — GLUCOSE, CAPILLARY: Glucose-Capillary: 327 mg/dL — ABNORMAL HIGH (ref 70–99)

## 2013-10-14 LAB — PHOSPHORUS: Phosphorus: 3.7 mg/dL (ref 2.3–4.6)

## 2013-10-14 LAB — EXPECTORATED SPUTUM ASSESSMENT W GRAM STAIN, RFLX TO RESP C

## 2013-10-14 MED ORDER — POLYETHYLENE GLYCOL 3350 17 G PO PACK
17.0000 g | PACK | Freq: Every day | ORAL | Status: DC
  Administered 2013-10-14: 17 g via ORAL
  Filled 2013-10-14: qty 1

## 2013-10-14 MED ORDER — DOXYCYCLINE HYCLATE 100 MG PO TABS: 100.0000 mg | ORAL_TABLET | Freq: Two times a day (BID) | ORAL | Status: DC

## 2013-10-14 MED ORDER — METOPROLOL SUCCINATE ER 25 MG PO TB24: 25.0000 mg | ORAL_TABLET | Freq: Every day | ORAL | Status: DC

## 2013-10-14 MED ORDER — SIMVASTATIN 20 MG PO TABS: 20.0000 mg | ORAL_TABLET | Freq: Every day | ORAL | Status: DC

## 2013-10-14 MED ORDER — ASPIRIN 81 MG PO TABS: 81.0000 mg | ORAL_TABLET | Freq: Every morning | ORAL | Status: DC

## 2013-10-14 MED ORDER — INSULIN GLARGINE 100 UNIT/ML ~~LOC~~ SOLN: 15.0000 [IU] | Freq: Every day | SUBCUTANEOUS | Status: DC

## 2013-10-14 MED ORDER — SPIRONOLACTONE 12.5 MG HALF TABLET: 12.5000 mg | ORAL_TABLET | Freq: Every day | ORAL | Status: DC

## 2013-10-14 MED ORDER — ISOSORBIDE MONONITRATE ER 30 MG PO TB24: 30.0000 mg | ORAL_TABLET | Freq: Every day | ORAL | Status: DC

## 2013-10-14 MED ORDER — INSULIN GLARGINE 100 UNIT/ML ~~LOC~~ SOLN: 12.0000 [IU] | Freq: Every day | SUBCUTANEOUS | Status: DC

## 2013-10-14 MED ORDER — SENNOSIDES-DOCUSATE SODIUM 8.6-50 MG PO TABS: 2.0000 | ORAL_TABLET | Freq: Every day | ORAL | Status: DC

## 2013-10-14 MED ORDER — PREDNISONE 10 MG PO TABS: 40.0000 mg | ORAL_TABLET | Freq: Every day | ORAL | Status: DC

## 2013-10-14 NOTE — Progress Notes (Signed)
Went over discharge with patient and daughter was at bedside.  Patient stated she understands.  Ernesta Amble, RN

## 2013-10-14 NOTE — Progress Notes (Signed)
Spoke with pt concerning Home Health, she selected [facility]. Referral given to in house rep.

## 2013-10-14 NOTE — Progress Notes (Signed)
Patient given cepacol lozenges with some relief of sore throat. At one point patient stated she felt like her throat was closing up. Throat observed by this RN and no swelling noted. Patient then coughed up a very large, thick, stringy amount of sputum with obvious relief of the throat closing up sensation after coughing up sputum. Will continue to monitor. Ginny Forth

## 2013-10-14 NOTE — Progress Notes (Signed)
    Subjective:  Feeling better today. Dressed and sitting up in chair. No chest pain, palpitations, or resting dyspnea. Still with cough.  Objective:  Vital Signs in the last 24 hours: Temp:  [97.2 F (36.2 C)-98.2 F (36.8 C)] 98.2 F (36.8 C) (11/25 0451) Pulse Rate:  [80-89] 88 (11/25 0451) Resp:  [16-18] 16 (11/25 0451) BP: (117-138)/(54-96) 138/96 mmHg (11/25 0451) SpO2:  [91 %-98 %] 91 % (11/25 0807) Weight:  [145 lb 4.8 oz (65.908 kg)] 145 lb 4.8 oz (65.908 kg) (11/25 0451)  Intake/Output from previous day: 11/24 0701 - 11/25 0700 In: 1200 [P.O.:1200] Out: -   Physical Exam: Pt is alert and oriented, NAD HEENT: normal Neck: JVP - normal Lungs: Coarse bilaterally, scattered rhonchi CV: RRR without murmur or gallop Abd: soft, NT, Positive BS, no hepatomegaly Ext: no C/C/E, distal pulses intact and equal Skin: warm/dry no rash   Lab Results:  Recent Labs  10/13/13 0435 10/14/13 0507  WBC 16.2* 9.4  HGB 13.6 13.4  PLT 156 131*    Recent Labs  10/13/13 0435 10/14/13 0507  NA 135 136  K 4.3 3.8  CL 100 99  CO2 27 25  GLUCOSE 240* 282*  BUN 30* 29*  CREATININE 0.73 0.69   No results found for this basename: TROPONINI, CK, MB,  in the last 72 hours  Tele: Not on tele  Assessment/Plan:  1. Acute on chronic systolic heart failure, improved 2. Ischemic cardiomyopathy 3. COPD exacerbation  Clinically improved. Medications and labs reviewed and I think patient remains stable. No ACE/ARB because of severe allergic rxn in past. Follow-up arrangements made and our office will be in touch with her for labwork and a follow-up office visit. Please call if any questions. thx   Tonny Bollman, M.D. 10/14/2013, 11:58 AM

## 2013-10-14 NOTE — Progress Notes (Signed)
Physical Therapy Treatment Patient Details Name: [name] MRN: [id number] DOB: [birth year] Today's Date: 10/14/2013 Time: 1191-4782 PT Time Calculation (min): 14 min  PT Assessment / Plan / Recommendation  History of Present Illness per H& P: Brought in by the son in law 10/10/13. Was in the emergency room 09/22/2013 and was discharged with Levaquin and prednisone for a diagnosis COPD exacerbation. Apparently this helped symptoms but as soon as the course was over in 5 days she started having worsening shortness of breath cough and wheeze along with increased sputum volume. She does not know the sputum color because she swallows it or is difficult to come up. She then saw her primary care physician 4 days ago but due to her recurrent nature of COPD exacerbation she was asked to followup here and she presented to the office 10/10/2013. In the office she was found to be saturating 88% on room air at rest which is below her baseline. She was admitted to worsening orthopnea and worsening pedal edema compared to baseline. Not clear if there is a fever. COPD cat score significantly worse than baseline   PT Comments   Progressing with mobility. Intermittently unsteady by no LOB. Pt states she thinks she has walker at home. Pt also plans to use entrance with 1 step with family assisting. Agreeable to HHPT.   Follow Up Recommendations  Home health PT     Does the patient have the potential to tolerate intense rehabilitation     Barriers to Discharge        Equipment Recommendations   (pt states she thinks she has a walker at home.)    Recommendations for Other Services    Frequency Min 3X/week   Progress towards PT Goals Progress towards PT goals: Progressing toward goals  Plan Current plan remains appropriate    Precautions / Restrictions Precautions Precautions: Fall Restrictions Weight Bearing Restrictions: No   Pertinent Vitals/Pain No c/o pain    Mobility  Bed Mobility Bed  Mobility: Not assessed Transfers Transfers: Sit to Stand;Stand to Sit Sit to Stand: 5: Supervision Stand to Sit: 5: Supervision Ambulation/Gait Ambulation/Gait Assistance: 4: Min guard Ambulation Distance (Feet): 285 Feet Ambulation/Gait Assistance Details: O2 sats 91% on RA. slightly unsteady intermittently. Pt used handrail in hallway intermittently. Dyspnea 1-2/4 Gait Pattern: Step-through pattern    Exercises     PT Diagnosis:    PT Problem List:   PT Treatment Interventions:     PT Goals (current goals can now be found in the care plan section)    Visit Information  Last PT Received On: 10/14/13 Assistance Needed: +1 History of Present Illness: per H& P: Brought in by the son in law 10/10/13. Was in the emergency room 09/22/2013 and was discharged with Levaquin and prednisone for a diagnosis COPD exacerbation. Apparently this helped symptoms but as soon as the course was over in 5 days she started having worsening shortness of breath cough and wheeze along with increased sputum volume. She does not know the sputum color because she swallows it or is difficult to come up. She then saw her primary care physician 4 days ago but due to her recurrent nature of COPD exacerbation she was asked to followup here and she presented to the office 10/10/2013. In the office she was found to be saturating 88% on room air at rest which is below her baseline. She was admitted to worsening orthopnea and worsening pedal edema compared to baseline. Not clear if there is a fever.  COPD cat score significantly worse than baseline    Subjective Data      Cognition  Cognition Arousal/Alertness: Awake/alert Behavior During Therapy: WFL for tasks assessed/performed Overall Cognitive Status: Within Functional Limits for tasks assessed    Balance  Balance Balance Assessed: Yes Static Standing Balance Static Standing - Balance Support: No upper extremity supported Static Standing - Comment/# of  Minutes: EO/EC, withstanding external perturbations, narrow BOS-no LOB but pt could not achieve narrow BOS standing without 1 hand support; pt was able to maintain once in position.  Dynamic Standing Balance Dynamic Standing - Comments: 360 degree turn, p/u object-no LOB. ~5 seconds to complete turns.   End of Session PT - End of Session Activity Tolerance: Patient tolerated treatment well Patient left: in chair;with family/visitor present   GP     Rebeca Alert, MPT Pager: [phone]

## 2013-10-14 NOTE — Discharge Summary (Signed)
Physician Discharge Summary  Patient ID: [name] MRN: [id number] DOB/AGE: 05-04-1941 72 y.o.  Admit date: 10/10/2013 Discharge date: 10/14/2013    Discharge Diagnoses:  Acute Exacerbation of COPD Acute Bronchitis Bronchopneumonia CAD Ischemic Cardiomyopathy Acute on Chronic Systolic / Diastolic CHF Allergy to ACE Diabetes Mellitus Polypharmacy                                                                     DISCHARGE PLAN BY DIAGNOSIS    Acute Exacerbation of COPD Acute Bronchitis Bronchopneumonia  Discharge Plan: -doxycycline BID for total of 7 days -pulmonary hygiene -follow up on pulmonary culture results with PCP (specimen obtained on 11/25) -prednisone taper to off -will need CT chest in 10/2013 for follow up of PNA  CAD Ischemic Cardiomyopathy Acute on Chronic Systolic / Diastolic CHF Allergy to ACE  Discharge Plan: -continue aldactone with f/u BMP with Cardiology in 2 weeks to review for hyperkalemia -continue metoprolol succinate -ASA, plavix, zocor, imdur -arranged for outpatient stress test.     Diabetes Mellitus  Discharge Plan: -continue Lantus 12 units QD -continue home oral regimen -f/u A1c with PCP    Polypharmacy   Discharge Plan: -home health RN to review medications with patient  -consider elimination of medications as able -d/c of several narcotics at discharge.  Concern for falls / accidental overdose / delirium with Alzheimer's                 DISCHARGE SUMMARY   [name] is a 72 y.o. y/o female, smoker, with a PMH of DM, HTN, OSA, GERD, Anxiety, Dementia, CAD, ICM and recent admission to [facility] on 11/3 with acute exacerbation of COPD.  Patient was treated with Levaquin & Prednisone upon discharge.  She completed a 5 day course but began having worsening of shortness of breath, wheezing, and increase in volume of sputum. She saw her primary care physician 11/17 but due to her recurrent nature of COPD  exacerbation she was asked to followup in the pulmonary office on 10/10/2013.  At time of evaluation, she was found to have room air saturations of 88%, worsening orthopnea, and pedal edema.     Admitted to [facility] where workup revealed new systolic dysfunction. ECHO with EF of 30-35%, diffuse hypokiniesis, grade 1 diastolic dysfunction, mild MR.  Cardiology consulted for formal evaluation.  Patients CHF regimen adjusted while inpatient (see medication Rx below).  She was placed on scheduled bronchodilators, IV steroids, and doxycycline for AECOPD.  Empirically diuresed, BNP (mild elevation) & troponin trended (negative).  Lower extremity dopplers were evaluated and negative for DVT.  Patient clinically improved with mentioned Rx.  Glycemic control achieved while on steroids during inpatient stay with sliding scale insulin.  She will transition back to home oral regimen at discharge.  Expect glucose control will improve with taper of steroids to off.  Home health PT, RN for discharge.     SIGNIFICANT DIAGNOSTIC STUDIES 11/21 TTE >>> EF 30-35%, diffuse hypokinesis, grade 1 diastolic dysfunction, mild MR 40/98 LE Doppler>>>NEG  CONSULTS Cardiology - Dr. Excell Seltzer  Discharge Exam: General: Resting in the chair, no distress  Neuro: Awake, alert, cooperative  HEENT: No JVD  Cardiovascular: Regular rate and rhythm  Lungs: Bilateral air entry,  few rhonchi clearing with cough  Abdomen: Soft nontender no organomegaly  Musculoskeletal: Trace pedal edema  Skin: No rash   Filed Vitals:   10/14/13 0451 10/14/13 0807 10/14/13 1352 10/14/13 1419  BP: 138/96  125/62   Pulse: 88  93   Temp: 98.2 F (36.8 C)  97.9 F (36.6 C)   TempSrc: Oral  Oral   Resp: 16  16   Height:      Weight: 145 lb 4.8 oz (65.908 kg)     SpO2: 94% 91% 95% 92%    Discharge Labs  BMET  Recent Labs Lab 10/10/13 1431 10/11/13 0200 10/12/13 0436 10/13/13 0435 10/14/13 0507  NA 140 134* 132* 135 136  K  4.5 3.9 4.5 4.3 3.8  CL 104 96 95* 100 99  CO2 24 27 22 27 25   GLUCOSE 99 243* 384* 240* 282*  BUN 15 23 23  30* 29*  CREATININE 0.62 0.74 0.66 0.73 0.69  CALCIUM 9.5 9.4 9.6 9.6 9.5  MG 1.4*  --  1.8 2.0 1.9  PHOS 3.6  --  3.5 4.1 3.7   CBC  Recent Labs Lab 10/12/13 0646 10/13/13 0435 10/14/13 0507  HGB 14.6 13.6 13.4  HCT 42.8 39.3 39.9  WBC 15.9* 16.2* 9.4  PLT 166 156 131*        Discharge Orders   Future Appointments Provider Department Dept Phone   10/23/2013 3:00 PM Nilda Riggs, NP [facility] [phone]   10/28/2013 11:15 AM [facility] [place] Office [phone]   10/28/2013 11:30 AM Beatrice Lecher, PA-C [facility] [place] Office [phone]   11/05/2013 10:30 AM [facility] [address] [facility] [address] [phone]   Patient to arrive 15 minutes prior to appointment time.   12/12/2013 10:30 AM [name], MD [facility] [place] Office [phone]   Future Orders Complete By Expires   Call MD for:  difficulty breathing, headache or visual disturbances  As directed    Call MD for:  persistant dizziness or light-headedness  As directed    Call MD for:  persistant nausea and vomiting  As directed    Call MD for:  severe uncontrolled pain  As directed    Call MD for:  temperature >100.4  As directed    Diet - low sodium heart healthy  As directed    Discharge instructions  As directed    Comments:     1. Review medications carefully as they have changed.   Increase activity slowly  As directed         Medication List    STOP taking these medications       HYDROcodone-acetaminophen 7.5-325 mg/15 ml solution  Commonly known as:  HYCET     hydrOXYzine 25 MG tablet  Commonly known as:  ATARAX/VISTARIL     levofloxacin 500 MG tablet  Commonly known as:  LEVAQUIN     metoprolol tartrate 25 MG tablet  Commonly known as:  LOPRESSOR     oxyCODONE-acetaminophen 5-325 MG per  tablet  Commonly known as:  PERCOCET/ROXICET     promethazine-dextromethorphan 6.25-15 MG/5ML syrup  Commonly known as:  PROMETHAZINE-DM      TAKE these medications       albuterol (2.5 MG/3ML) 0.083% nebulizer solution  Commonly known as:  PROVENTIL  Take 2.5 mg by nebulization every 6 (six) hours as needed for wheezing or shortness of breath.     albuterol 108 (90 BASE) MCG/ACT inhaler  Commonly known as:  PROVENTIL HFA;VENTOLIN HFA  Inhale 2 puffs into the lungs every 6 (six) hours as needed for wheezing.     aspirin 81 MG tablet  Take 1 tablet (81 mg total) by mouth every morning.     clopidogrel 75 MG tablet  Commonly known as:  PLAVIX  Take 75 mg by mouth every morning.     doxycycline 100 MG tablet  Commonly known as:  VIBRA-TABS  Take 1 tablet (100 mg total) by mouth every 12 (twelve) hours. Next dose due evening of 11/25     DULERA 200-5 MCG/ACT Aero  Generic drug:  mometasone-formoterol  Inhale 2 puffs into the lungs 2 (two) times daily.     Guaifenesin 1200 MG Tb12  Take 1 tablet (1,200 mg total) by mouth 2 (two) times daily.     ibuprofen 200 MG tablet  Commonly known as:  ADVIL,MOTRIN  Take 400 mg by mouth every 6 (six) hours as needed.     insulin aspart 100 unit/ml Soln  Commonly known as:  novoLOG  Inject 5 Units into the skin 3 (three) times daily with meals.     insulin glargine 100 UNIT/ML injection  Commonly known as:  LANTUS  Inject 0.12 mLs (12 Units total) into the skin daily.     isosorbide mononitrate 30 MG 24 hr tablet  Commonly known as:  IMDUR  Take 1 tablet (30 mg total) by mouth daily.     JENTADUETO 2.03-999 MG Tabs  Generic drug:  Linagliptin-Metformin HCl  Take 1 tablet by mouth 2 (two) times daily.     LORazepam 0.5 MG tablet  Commonly known as:  ATIVAN  Take 0.5 mg by mouth 2 (two) times daily as needed for anxiety.     metoprolol succinate 25 MG 24 hr tablet  Commonly known as:  TOPROL-XL  Take 1 tablet (25 mg total) by  mouth daily.     nitroGLYCERIN 0.4 MG SL tablet  Commonly known as:  NITROSTAT  Place 1 tablet (0.4 mg total) under the tongue every 5 (five) minutes as needed. For chest pain.     PARoxetine 10 MG tablet  Commonly known as:  PAXIL  Take 10 mg by mouth daily.     pravastatin 40 MG tablet  Commonly known as:  PRAVACHOL  Take 1 tablet (40 mg total) by mouth every evening.     predniSONE 10 MG tablet  Commonly known as:  DELTASONE  Take 4 tablets (40 mg total) by mouth daily with breakfast. 3 tabs for 3 days, then 2 tabs for 3 days, then 1 tab for 3 days and stop     pregabalin 75 MG capsule  Commonly known as:  LYRICA  Take 75 mg by mouth 2 (two) times daily.     rivastigmine 9.5 mg/24hr  Commonly known as:  EXELON  Place 1 patch onto the skin every morning.     senna-docusate 8.6-50 MG per tablet  Commonly known as:  Senokot-S  Take 2 tablets by mouth at bedtime. Hold for diarrhea     simvastatin 20 MG tablet  Commonly known as:  ZOCOR  Take 1 tablet (20 mg total) by mouth daily at 6 PM.     spironolactone 12.5 mg Tabs tablet  Commonly known as:  ALDACTONE  Take 0.5 tablets (12.5 mg total) by mouth daily.     tolterodine 4 MG 24 hr capsule  Commonly known as:  DETROL LA  Take 4 mg by mouth  every morning.        Disposition: Discharge to home with home health PT, RN for medication  / CHF review.   Discharged Condition: [name] has met maximum benefit of inpatient care and is medically stable and cleared for discharge.  Patient is pending follow up as above.      Time spent on disposition:  Greater than 35 minutes.   Signed: Canary Brim, NP-C [facility] Pgr: [phone] Office: [phone]  Evaluated by me.  Awake, alert, oriented, appropriate. No significant dyspnea.  Appears stable for discharge.  States she has a relative staying with her to help with ADLs.  I have personally obtained history, examined patient, evaluated and  interpreted laboratory and imaging results, reviewed medical records, formulated assessment / plan and placed orders.  Lonia Farber, MD Pulmonary and Critical Care Medicine [facility] Pager: [phone]  10/14/2013, 3:33 PM

## 2013-10-14 NOTE — Progress Notes (Deleted)
PULMONARY  / CRITICAL CARE MEDICINE  Name: [name] MRN: [id number] DOB: 1941/03/02    ADMISSION DATE:  10/10/2013  REFERRING MD :  PCCM PRIMARY SERVICE: PCCM   CHIEF COMPLAINT:  Progressive dyspnea  BRIEF PATIENT DESCRIPTION: 72 y/o with COPD and CAD admitted with progressive dyspnea.  Workup revealed new systolic dysfunction.  EVENTS / STUDIES: 11/21 - TTE >>>  EF 30-35%, diffuse hypokinesis, grade 1 diastolic dysfunction, mild MR 78/46 - LE Doppler>>>neg for DVT  SUBJECTIVE:  Pt denies SOB.  Reports constipation.  RN concerned regarding wandering, pt unclear of surroundings at times / confusion.  Coughed up thick yellow sputum (glue like consistency)  VITAL SIGNS: Temp:  [97.2 F (36.2 C)-98.2 F (36.8 C)] 98.2 F (36.8 C) (11/25 0451) Pulse Rate:  [80-89] 88 (11/25 0451) Resp:  [16-18] 16 (11/25 0451) BP: (117-138)/(54-96) 138/96 mmHg (11/25 0451) SpO2:  [91 %-98 %] 91 % (11/25 0807) Weight:  [145 lb 4.8 oz (65.908 kg)] 145 lb 4.8 oz (65.908 kg) (11/25 0451)  INTAKE / OUTPUT: Intake/Output     11/24 0701 - 11/25 0700 11/25 0701 - 11/26 0700   P.O. 1200    Total Intake(mL/kg) 1200 (18.2)    Urine (mL/kg/hr)     Total Output       Net +1200          Urine Occurrence 7 x      PHYSICAL EXAMINATION: General:  Resting in the chair, no distress Neuro:  Awake, alert, cooperative, speech clear HEENT:  No JVD Cardiovascular:  Regular rate and rhythm Lungs:  resp's even/non-labored, lungs bilaterally clear Abdomen:  Soft nontender no organomegaly Musculoskeletal:  Trace pedal edema Skin:  No rash  LABS:  CBC  Recent Labs Lab 10/12/13 0646 10/13/13 0435 10/14/13 0507  WBC 15.9* 16.2* 9.4  HGB 14.6 13.6 13.4  HCT 42.8 39.3 39.9  PLT 166 156 131*   BMET  Recent Labs Lab 10/12/13 0436 10/13/13 0435 10/14/13 0507  NA 132* 135 136  K 4.5 4.3 3.8  CL 95* 100 99  CO2 22 27 25   BUN 23 30* 29*  CREATININE 0.66 0.73 0.69  GLUCOSE 384* 240* 282*    Electrolytes  Recent Labs Lab 10/12/13 0436 10/13/13 0435 10/14/13 0507  CALCIUM 9.6 9.6 9.5  MG 1.8 2.0 1.9  PHOS 3.5 4.1 3.7   Sepsis Markers  Recent Labs Lab 10/10/13 1431 10/10/13 1432 10/10/13 1940  LATICACIDVEN  --  2.7* 2.1  PROCALCITON <0.10  --   --    ABG  Recent Labs Lab 10/10/13 1352  PHART 7.350  PCO2ART 46.7*  PO2ART 92.3   Liver Enzymes  Recent Labs Lab 10/10/13 1431  AST 13  ALT 9  ALKPHOS 70  BILITOT 0.3  ALBUMIN 3.6   Cardiac Enzymes  Recent Labs Lab 10/10/13 1431 10/10/13 1432 10/10/13 1940 10/11/13 0200 10/12/13 0436  TROPONINI <0.30  --  <0.30 <0.30  --   PROBNP  --  776.6*  --   --  632.7*   Glucose  Recent Labs Lab 10/12/13 2230 10/13/13 0734 10/13/13 1152 10/13/13 1653 10/13/13 2141 10/14/13 0738  GLUCAP 236* 208* 237* 282* 273* 197*   CXR:  Pending >>>  ASSESSMENT / PLAN:  AECOPD -  Resolving   -->  Albuterol / Atrovent  -->  Dulera  -->  Prednisone, complete 14 day taper  -->  Mucinex  CAD Ischemic cardiomyopathy Acute on chronic systolic / diastolic congestive heart failure Allergy to ACE   -->  Appreciate Cardiology recommendations  -->  ASA, Plavix, Zocor, Imdur, Metoprolol  -->  Lasix, Aldactone  -->  Outpatient stress test             -->  Follow up with Cardiology post D/C (arranged)  DM Hyperglycemia, likely steroid induced    -->  SSI  -->  Lantus 15 units QD  Acute bronchitis Resolving bronchopneumonia Leukocytosis, in part steroid induced   -->  Doxycycline, complete 7 days  -->  CT chest in dec 2014 as follow up             -->  Follow up CXR now   Delirium vs Dementia              -->  Assess UA, PCXR now             -->  Continue PSY meds  Px   -->  Heparin Trent  -->  Protonix   I have personally obtained history, examined patient, evaluated and interpreted laboratory and imaging results, reviewed medical records, formulated assessment / plan and placed  orders.  Canary Brim, NP-C [facility] Pgr: [phone] or [phone]   10/14/2013, 10:30 AM

## 2013-10-14 NOTE — Progress Notes (Signed)
Patient has become very confused since her admission last Friday, she is wondering the halls and was unaware that she was in the hospital.  I have reoriented the patient several times this morning and will continue to monitor.  Ernesta Amble, RN

## 2013-10-17 LAB — CULTURE, RESPIRATORY W GRAM STAIN

## 2013-10-17 LAB — CULTURE, RESPIRATORY

## 2013-10-20 ENCOUNTER — Other Ambulatory Visit: Payer: Self-pay | Admitting: Family Medicine

## 2013-10-20 ENCOUNTER — Ambulatory Visit
Admission: RE | Admit: 2013-10-20 | Discharge: 2013-10-20 | Disposition: A | Discharge status: Home / Self Care | Payer: Medicare Other | Source: Ambulatory Visit | Attending: Family Medicine | Admitting: Family Medicine

## 2013-10-20 DIAGNOSIS — I509 Heart failure, unspecified: Secondary | ICD-10-CM

## 2013-10-23 ENCOUNTER — Ambulatory Visit: Payer: Medicare Other | Admitting: Nurse Practitioner

## 2013-10-23 ENCOUNTER — Encounter: Payer: Self-pay | Admitting: Internal Medicine

## 2013-10-24 ENCOUNTER — Telehealth: Payer: Self-pay | Admitting: Internal Medicine

## 2013-10-24 MED ORDER — LEVOFLOXACIN 500 MG PO TABS: 500.0000 mg | ORAL_TABLET | Freq: Every day | ORAL | Status: DC

## 2013-10-24 NOTE — Telephone Encounter (Signed)
i called pt and is aware. RX has also been sent. appt scheduled. Nothing further needed

## 2013-10-24 NOTE — Telephone Encounter (Signed)
Being sent in response to a staff message from [facility] in triage   I am not sure why this high risk copd patient who was on our service was not given < 1 week fu with NP or an MD in the office at dc even if the dc dx was systolic CHF  I am thinking primnary prioblem was systolic chf. This stenotrophomonas could have been secndary problem; she should start  take levaquin 500mg  once daily  X 7 days  Needs fu wih me or NP on 10/24/13 or by 10/31/13  Dr. Kalman Shan, M.D., [facility].C.P Pulmonary and Critical Care Medicine Staff Physician  System [facility] Pager: [phone], If no answer or between  15:00h - 7:00h: call [phone]  10/24/2013 1:30 AM

## 2013-10-28 ENCOUNTER — Encounter: Payer: Self-pay | Admitting: Physician Assistant

## 2013-10-28 ENCOUNTER — Ambulatory Visit (INDEPENDENT_AMBULATORY_CARE_PROVIDER_SITE_OTHER): Payer: Medicare Other | Admitting: Physician Assistant

## 2013-10-28 ENCOUNTER — Other Ambulatory Visit: Payer: Medicare Other

## 2013-10-28 VITALS — BP 110/56 | HR 94 | Ht 65.0 in | Wt 144.0 lb

## 2013-10-28 DIAGNOSIS — E785 Hyperlipidemia, unspecified: Secondary | ICD-10-CM

## 2013-10-28 DIAGNOSIS — I2589 Other forms of chronic ischemic heart disease: Secondary | ICD-10-CM

## 2013-10-28 DIAGNOSIS — I5022 Chronic systolic (congestive) heart failure: Secondary | ICD-10-CM

## 2013-10-28 DIAGNOSIS — I10 Essential (primary) hypertension: Secondary | ICD-10-CM

## 2013-10-28 DIAGNOSIS — R0602 Shortness of breath: Secondary | ICD-10-CM

## 2013-10-28 DIAGNOSIS — I2510 Atherosclerotic heart disease of native coronary artery without angina pectoris: Secondary | ICD-10-CM

## 2013-10-28 DIAGNOSIS — J441 Chronic obstructive pulmonary disease with (acute) exacerbation: Secondary | ICD-10-CM

## 2013-10-28 DIAGNOSIS — I255 Ischemic cardiomyopathy: Secondary | ICD-10-CM

## 2013-10-28 LAB — BASIC METABOLIC PANEL
CO2: 23 mEq/L (ref 19–32)
GFR: 77.16 mL/min (ref >60.00)
Glucose, Bld: 126 mg/dL — ABNORMAL HIGH (ref 70–99)
Potassium: 4.2 mEq/L (ref 3.5–5.1)
Sodium: 136 mEq/L (ref 135–145)

## 2013-10-28 MED ORDER — FUROSEMIDE 20 MG PO TABS: 20.0000 mg | ORAL_TABLET | ORAL | Status: DC | PRN

## 2013-10-28 NOTE — Patient Instructions (Addendum)
Your physician recommends that you schedule a follow-up appointment ON 12/12/13 WITH DR. Clifton James  YOU WILL NEED TO HAVE A LEXISCAN MYOVIEW 1-2 WEEKS BEFORE YOUR APPT WITH DR. Clifton James  AN RX FOR LASIX 20 MG HAS BEEN SENT IN TODAY  MAKE SURE TO WEIGH DAILY; IF YOUR WEIGHT IS UP BY 3 LB'S X 1 DAY YOU CAN TAKE THE LASIX 20 MG ONE TIME DAILY;   MAKE SURE TO USE YOUR NEBULIZER 2-3 TIMES A DAY ON A DAILY BASIS UNTIL YOU SEE PULMONOLOGY

## 2013-10-28 NOTE — Progress Notes (Signed)
[address] [place], [place]  [address] Phone: [phone] Fax:  [phone]  Date:  10/28/2013   ID:  [name], DOB [birth year], MRN [id number]  PCP:  [name], MD  Cardiologist:  Dr. Verne Carrow     History of Present Illness: [name] is a 72 y.o. female with a hx of DM2, HTN, HL, tobacco abuse and CAD. She was admitted in 12/2010 with an acute inferior STEMI. Emergent cath on 12/29/10 with serial lesions in subtotally occluded RCA. Dr. Verne Carrow was unable to stent the vessel during the index procedure because of severe calcification in the mid vessel. Flow was established with balloon inflations and she was treated with Integrilin, Effient and ASA. She had staged PCI 12/31/10 at which time a rotablator atherectomy of the mid RCA was performed. She did well with both procedures. Two overlapping drug eluting stents were placed in the mid RCA. EF was found to be 45 % at the time of the cath. There was mild disease in the LAD and the Circumflex arteries. Echo 5/12: EF 55-60%, grade 1 diastolic dysfunction, mild MR, mild LAE.  Last seen by Dr. Verne Carrow in 05/2013.    She was recently admitted 11/21-11/25 with acute systolic heart failure in the setting of acute exacerbation of COPD.  Cardiac enzymes were negative.  Echocardiogram (10/10/13): EF 30-35%, diffuse HK with moderate inferior HK, grade 1 diastolic dysfunction, mild MR. Patient was seen by cardiology. She was diuresed. She was not placed on ACE inhibitor or ARB given prior history of respiratory arrest with ACE inhibitor therapy. She was placed on spironolactone. She was placed on long-acting beta blocker. Long-acting nitrates were added.  Dr. Graciela Husbands saw the patient in the hospital and did recommend considering outpatient stress testing to rule out ischemia as a cause of her reduced ejection fraction. There was also some thought given to whether or not an implantable loop recorder  should be placed to rule out the possibility of atrial fibrillation (negative monitor in the recent past) with her history of palpitations. Of note, sputum culture since discharge has demonstrated Stenotrophomonas maltophilia and pulmonary has placed her on Levaquin.  She has f/u this week.   She saw her PCP last week and was told to increase her Spironolactone for 3 days.  She was told she had rales on exam.  Of note, CXR 10/20/13 was unremarkable.  She felt weak and tremulous with this and stopped the Spironolactone.  Her weights have been stable.  Her weakness is slowly improving.  Cough is improved.  She denies chest pain.  Breathing is stable.  She is NYHA Class 3.  She has slept on 2 pillows for years.  She denies PND, edema.  She denies syncope or lightheadedness.    Recent Labs: 10/30/2012: TSH 1.82  12/19/2012: HDL 31.30*; LDL (calc) 10  10/10/2013: ALT 9  10/12/2013: Pro B Natriuretic peptide (BNP) 632.7*  10/14/2013: Creatinine 0.69; Hemoglobin 13.4; Potassium 3.8   Wt Readings from Last 3 Encounters:  10/28/13 144 lb (65.318 kg)  10/14/13 145 lb 4.8 oz (65.908 kg)  10/10/13 150 lb 6.4 oz (68.221 kg)     Past Medical History  Diagnosis Date  . CAD (coronary artery disease)     s/p inferior STEMI 12/29/10 with rotablator atherectomy RCA 12/31/10 and DES x 2 RCA  . Ischemic cardiomyopathy     EF 45%cath 2012 EF55% 5/12; 35% 11/14  . Diabetes mellitus  type 2  . HTN (hypertension)   . Hyperlipidemia   . Diverticulosis   . Respiratory arrest//ACE Inhibitor presumed cause   . COPD (chronic obstructive pulmonary disease)   . Small bowel obstruction     from a sigmoid stricture  . Nephrolithiasis   . Sleep apnea   . Overweight(278.02)   . Osteoarthritis   . GERD (gastroesophageal reflux disease)   . Anxiety   . Glaucoma   . Memory deficit     Current Outpatient Prescriptions  Medication Sig Dispense Refill  . albuterol (PROVENTIL HFA;VENTOLIN HFA) 108 (90 BASE)  MCG/ACT inhaler Inhale 2 puffs into the lungs every 6 (six) hours as needed for wheezing.      Marland Kitchen albuterol (PROVENTIL) (2.5 MG/3ML) 0.083% nebulizer solution Take 2.5 mg by nebulization every 6 (six) hours as needed for wheezing or shortness of breath.       Marland Kitchen aspirin 81 MG tablet Take 1 tablet (81 mg total) by mouth every morning.      . clopidogrel (PLAVIX) 75 MG tablet Take 75 mg by mouth every morning.      . Guaifenesin 1200 MG TB12 Take 1 tablet (1,200 mg total) by mouth 2 (two) times daily.  20 each  0  . ibuprofen (ADVIL,MOTRIN) 200 MG tablet Take 400 mg by mouth every 6 (six) hours as needed.      . insulin aspart (NOVOLOG) 100 unit/ml SOLN Inject 5 Units into the skin 3 (three) times daily with meals.       . insulin glargine (LANTUS) 100 UNIT/ML injection Inject 0.12 mLs (12 Units total) into the skin daily.  10 mL  12  . isosorbide mononitrate (IMDUR) 30 MG 24 hr tablet Take 1 tablet (30 mg total) by mouth daily.  30 tablet  3  . JENTADUETO 2.03-999 MG TABS Take 1 tablet by mouth 2 (two) times daily.       Marland Kitchen levofloxacin (LEVAQUIN) 500 MG tablet Take 1 tablet (500 mg total) by mouth daily.  7 tablet  0  . LORazepam (ATIVAN) 0.5 MG tablet Take 0.5 mg by mouth 2 (two) times daily as needed for anxiety.      . metoprolol succinate (TOPROL-XL) 25 MG 24 hr tablet Take 1 tablet (25 mg total) by mouth daily.  30 tablet  3  . mometasone-formoterol (DULERA) 200-5 MCG/ACT AERO Inhale 2 puffs into the lungs 2 (two) times daily.      . nitroGLYCERIN (NITROSTAT) 0.4 MG SL tablet Place 1 tablet (0.4 mg total) under the tongue every 5 (five) minutes as needed. For chest pain.  25 tablet  6  . oxyCODONE-acetaminophen (PERCOCET/ROXICET) 5-325 MG per tablet Take 1 tablet by mouth every 4 (four) hours as needed for severe pain (TAKE ONE TABLET TWICE AS NEEDED).      Marland Kitchen PARoxetine (PAXIL) 10 MG tablet Take 10 mg by mouth daily.      . pravastatin (PRAVACHOL) 40 MG tablet Take 1 tablet (40 mg total) by mouth  every evening.  30 tablet  6  . pregabalin (LYRICA) 75 MG capsule Take 75 mg by mouth 2 (two) times daily.        . rivastigmine (EXELON) 9.5 mg/24hr Place 1 patch onto the skin every morning.       . senna-docusate (SENOKOT-S) 8.6-50 MG per tablet Take 2 tablets by mouth at bedtime. Hold for diarrhea  60 tablet  1  . tolterodine (DETROL LA) 4 MG 24 hr capsule Take 4 mg by mouth  every morning.        No current facility-administered medications for this visit.    Allergies:   Lisinopril; Eggs or egg-derived products; Erythromycin; Iodine; Penicillins; Pentazocine lactate; Propoxyphene hcl; Quinapril hcl; Spironolactone; and Sulfonamide derivatives   Social History:  The patient  reports that she quit smoking about 3 weeks ago. Her smoking use included Cigarettes. She has a 12.5 pack-year smoking history. She has never used smokeless tobacco. She reports that she drinks about 0.5 ounces of alcohol per week. She reports that she does not use illicit drugs.   Family History:  The patient's family history includes Breast cancer in her maternal grandmother; Diabetes in her father and sister; Heart disease in her father. There is no history of Colon cancer.   ROS:  Please see the history of present illness.      All other systems reviewed and negative.   PHYSICAL EXAM: VS:  BP 110/56  Pulse 94  Ht 5\' 5"  (1.651 m)  Wt 144 lb (65.318 kg)  BMI 23.96 kg/m2  SpO2 90% Well nourished, well developed, in no acute distress HEENT: normal Neck: no JVDat 90 degrees Cardiac:  normal S1, S2; RRR; no murmur Lungs:  Decreased breath sounds bilaterally, ?faint bibasilar crackles, ins/exp wheezing throughout Abd: soft, nontender  Ext: no edema Skin: warm and dry Neuro:  CNs 2-12 intact, no focal abnormalities noted  EKG:  NSR, HR 94, normal axis, increased artifact, no acute changes.      ASSESSMENT AND PLAN:  1. Chronic Systolic CHF:  Overall stable.  I will give her a Rx for Lasix 20 mg QD prn.  We  discussed the importance of daily weights.  She will call if her weight increases by 3 lbs in one day.  Check BMET and BNP today.   2. Ischemic CM:  EF down during recent acute illness.  No new wall motion abnormalities on echo.  She cannot take ACEI/ARB due to hx of severe reaction in the past.  She did not tolerate spironolactone.  I suspect her BP ran too low with 25 mg.  We can certainly consider this again in the future.  She remains on beta blocker and nitrates.  I do not think her BP would tolerate and further additions or titration at this time.  Will plan on a Lexiscan myoview to rule out significant ischemia prior to her follow up with Dr. Verne Carrow in January. 3. CAD:  No angina.  Continue ASA, Plavix, statin.  Proceed with myoview in several weeks. 4. COPD Exacerbation:  Improved.  She sees pulmonary this week.  I have asked her to use her Albuterol Neb bid to tid until she sees pulmonary.  5. Hypertension:  Controlled. 6. Hyperlipidemia: continue statin. 7. Palpitations:  She had a negative monitor in 10/2012.  Dr. Sherryl Manges recommended considering an ILR to rule out AFib.  I will leave this up to Dr. Verne Carrow. 8. Disposition:  Plan Myoview in several weeks (to allow continued recovery from recent respiratory illness) and keep follow up with Dr. Verne Carrow in 11/2013.   Signed, Kathleen Newcomer, PA-C  10/28/2013 12:00 PM

## 2013-10-30 ENCOUNTER — Encounter: Payer: Self-pay | Admitting: Adult Health

## 2013-10-30 ENCOUNTER — Ambulatory Visit (INDEPENDENT_AMBULATORY_CARE_PROVIDER_SITE_OTHER): Payer: Medicare Other | Admitting: Adult Health

## 2013-10-30 VITALS — BP 126/64 | HR 103 | Temp 97.5°F | Ht 65.0 in | Wt 146.8 lb

## 2013-10-30 DIAGNOSIS — J441 Chronic obstructive pulmonary disease with (acute) exacerbation: Secondary | ICD-10-CM

## 2013-10-30 MED ORDER — HYDROCODONE-HOMATROPINE 5-1.5 MG/5ML PO SYRP: 5.0000 mL | ORAL_SOLUTION | Freq: Four times a day (QID) | ORAL | Status: DC | PRN

## 2013-10-30 MED ORDER — PREDNISONE 10 MG PO TABS: ORAL_TABLET | ORAL | Status: DC

## 2013-10-30 MED ORDER — IPRATROPIUM BROMIDE 0.02 % IN SOLN: 0.5000 mg | Freq: Three times a day (TID) | RESPIRATORY_TRACT | Status: DC

## 2013-10-30 NOTE — Progress Notes (Signed)
Subjective:    Patient ID: [name], female    DOB: [birth year], 72 y.o.   MRN: [id number]  HPI problem List 1. Ex Heavy Smoker 2. 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 Refuses spiriva, flu shot, rehab - In 2014: MDI is dulera 3. AECOPD hx - Aug 2011  (office Rx)   OV 06/12/2013    Admitted earlier in [place] for 2 days with worsening cough and yellow sputum for  1 months and dx with LL PNAand AECOPD. Dc on abx and pred taper and improved but cough worsened again. 3 days ago startred on levaquin and pred taper (few week taper) by [name], MD. Feeling fatigued overall. COPD CAT score is 29. Can make bed and overall fatigued  REC Have CXR today; will call with result. - > CLEARED Finish levaquin and prednisone ordered by Kathleen Bradford, MD Return to see me or NP [name]  in 3-4 weeks with COPD CAT Score at followup  07/10/2013 Follow up   [name], after a left lower lobe pneumonia. She finished her antibiotics with Levaquin. Chest x-ray showed a clearance of her left lower lobe infiltrate. Patient states that she is improved with decreased shortness, of breath, and congestion. However, continues to have a persistent dry cough. She is requesting a cough syrup. She is taking Mucinex DM without much relief. She is also use Tessalon the past, but has been intolerant to this. Patient denies any hemoptysis, orthopnea, PND, or increased leg swelling. Unfortunately, patient continues to smoke. Smoking cessation was discussed in detail.  CAT score 26 today .   REC Must quit smoking .  Mucinex DM Twice daily As needed Cough/congestion  Continue on Dulera 2 puffs Twice daily  May use Hydromet 1/2-1 tsp every 8hr As needed Cough , may make you sleepy (do not use with other pain meds )  follow up Dr. Marchelle [name] in 2-3 months and As needed  Please contact office for  sooner follow up if symptoms do not improve or worsen or seek emergency care      OV 09/09/2013 Had acute  shortness of breath and was taken to the emergency department 07/22/2013. Blood work reviewed and showed normal chemistry and normal CBC. Is no troponin check. A chest x-ray showed possible left pleural effusion but otherwise hyperinflated chest. However to me the chest x-ray shows either a small effusion or left lower lobe atelectasis which is similar to July 2014 and improved from the one prior to that and she had a pneumonia diagnoses in early July 2014. I do note that she's never had a CT scan of the chest. Patient was discharged 10 days of Levaquin and a prednisone taper which he still on. SHe continues to be symptomatiuc wit CAT Scoire 26   Of note she continues to smoke   OV 09/09/2013  She presents with her grand daughter for followup. She had CT scan of the chest in 07/31/2013 that shows only right lower lobe resolving pneumonia. She still continues to have severe cough. There is a baseline element to it with periodic exacerbations. Granddaughter does not feel that [name] helps is not completely. Cough is most dry. There is associated sinus drainage and ticklish sensation in the throat and she does not treat this. There is associated acid reflux. She is noted to be on beta blocker eyedrops but the granddaughter  says she's not compliant with this and does not think this is making her cough worse. Is only an occasional gag  Otherwise COPD is at baseline     10/30/2013 [facility] follow up  [name]. Patient was admitted November 21 through November 25, for a COPD exacerbation and CHF .  Admitted to [facility] where workup revealed new systolic dysfunction. ECHO with EF of 30-35%, diffuse hypokiniesis, grade 1 diastolic dysfunction, mild MR. Cardiology consulted for formal evaluation. Patients CHF regimen adjusted while inpatient (see  medication Rx below). She was placed on scheduled bronchodilators, IV steroids, and doxycycline for AECOPD. Empirically diuresed, BNP (mild elevation) & troponin trended (negative). Lower extremity dopplers were evaluated and negative for DVT.  Continues to have prod cough with yellow mucus, increased SOB, wheezing, chest tightness x2 days.  Will finish Levaquin 500mg  on 12/14 by PCP. Says that she does feel slightly better, however, continues to have some cough and congestion. She denies any hemoptysis, orthopnea, PND, or increased leg swelling Patient continues to smoke. Smoking cessation was encouraged. Does complain of intermittent constipation Patient had a CT scan planned for next week to followup lung nodules.      Review of Systems  Constitutional: Negative for fever and unexpected weight change.  HENT: Negative for congestion, dental problem, ear pain, nosebleeds, postnasal drip, rhinorrhea, sinus pressure, sneezing, sore throat and trouble swallowing.   Eyes: Negative for redness and itching.  Respiratory: Positive for cough,  Cardiovascular: Negative for palpitations and leg swelling.  Gastrointestinal: Negative for nausea and vomiting.  Genitourinary: Negative for dysuria.  Musculoskeletal: Negative for joint swelling.  Skin: Negative for rash.  Neurological: Negative for headaches.  Hematological: Does not bruise/bleed easily.  Psychiatric/Behavioral: Negative for dysphoric mood. The patient is not nervous/anxious.        Objective:   Physical Exam   GEN: A/Ox3; pleasant , NAD, elderly   HEENT:  [place]/AT,  EACs-clear, TMs-wnl, NOSE-clear, THROAT-clear, no lesions, no postnasal drip or exudate noted.   NECK:  Supple w/ fair ROM; no JVD; normal carotid impulses w/o bruits; no thyromegaly or nodules palpated; no lymphadenopathy.  RESP   diminshed BS in bases , few rhonchi   CARD:  RRR, no m/r/g  , no peripheral edema, pulses intact, no cyanosis or clubbing.  GI:   Soft  & nt; nml bowel sounds; no organomegaly or masses detected.  Musco: Warm bil, no deformities or joint swelling noted.   Neuro: alert, no focal deficits noted.    Skin: Warm, no lesions or rashes       Assessment & Plan:

## 2013-10-30 NOTE — Patient Instructions (Signed)
Must quit smoking completely.  Finish Levaquin .  Prednisone taper over next week.  Begin Probiotic daily  Gas x as needed  Miralax As needed  constiopation .  Mucinex DM Twice daily  As needed  Cough/congestion  Continue on Dulera 2 puffs Twice daily   Add Ipratropium Neb Three times a day  .  May use Hydromet 1/2-1 tsp every 8hr As needed  Cough , may make you sleepy (do not use with other pain meds )  Follow up Dr. [name] 4 weeks and As needed   Follow up for CT scan next week.  Please contact office for sooner follow up if symptoms do not improve or worsen or seek emergency care

## 2013-10-31 ENCOUNTER — Encounter: Payer: Self-pay | Admitting: Neurology

## 2013-10-31 ENCOUNTER — Ambulatory Visit (INDEPENDENT_AMBULATORY_CARE_PROVIDER_SITE_OTHER): Payer: Medicare Other | Admitting: Neurology

## 2013-10-31 VITALS — BP 129/76 | HR 95 | Wt 145.0 lb

## 2013-10-31 DIAGNOSIS — R413 Other amnesia: Secondary | ICD-10-CM

## 2013-10-31 NOTE — Progress Notes (Signed)
Reason for visit: Memory disorder  [name] is an 72 y.o. female  History of present illness:  [name] is a 72 year old right-handed white female with a history of a progressive memory disorder. The patient lives with her family, and she is rarely alone. The patient is having problems at times recognizing family members. The patient has been relatively stable with her memory issues over time. The patient was on Aricept, but she was not able tolerate this secondary to stomach upset. The patient was switched to Exelon, and she currently is on a 9.5 mg patch. The patient is doing well with this. The patient recently was in the hospital on 10/10/2013 with an exacerbation of her COPD, and bronchitis. The patient is on prednisone at this time for her breathing. The patient is getting physical therapy at home as well. The patient indicates that she is sleeping well at night. There has been some issue with anxiety, but the patient has responded to Paxil, currently taking 10 mg daily. The patient returns to this office for an evaluation.  Past Medical History  Diagnosis Date  . CAD (coronary artery disease)     s/p inferior STEMI 12/29/10 with rotablator atherectomy RCA 12/31/10 and DES x 2 RCA  . Ischemic cardiomyopathy     EF 45%cath 2012 EF55% 5/12; 35% 11/14  . Diabetes mellitus     type 2  . HTN (hypertension)   . Hyperlipidemia   . Diverticulosis   . Respiratory arrest//ACE Inhibitor presumed cause   . COPD (chronic obstructive pulmonary disease)   . Small bowel obstruction     from a sigmoid stricture  . Nephrolithiasis   . Sleep apnea   . Overweight(278.02)   . Osteoarthritis   . GERD (gastroesophageal reflux disease)   . Anxiety   . Glaucoma   . Memory deficit     Past Surgical History  Procedure Laterality Date  . Bilateral tubal ligation    . Lithotripsy    . 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 Cardiac surgery      Family  History  Problem Relation Age of Onset  . Breast cancer Maternal Grandmother   . Diabetes Father   . Heart disease Father   . Diabetes Sister   . Colon cancer Neg Hx     Social history:  reports that she quit smoking about 3 weeks ago. Her smoking use included Cigarettes. She has a 12.5 pack-year smoking history. She has never used smokeless tobacco. She reports that she drinks about 0.5 ounces of alcohol per week. She reports that she does not use illicit drugs.    Allergies  Allergen Reactions  . Lisinopril     REACTION: respiratory arrest jan 2009  . Eggs Or Egg-Derived Products Diarrhea  . Erythromycin Other (See Comments)    Makes me feel weird   . Iodine     Pt may be allergic to iodine, does not remember the details, refuses contrast-CS  . Penicillins Itching and Other (See Comments)    Too much as a kid  . Pentazocine Lactate Other (See Comments)    Reaction unknown  . Propoxyphene Hcl Other (See Comments)    Reaction unknown  . Quinapril Hcl Itching and Other (See Comments)    Too much   . Spironolactone     PATIENT HAD SEVERE SIDE EFFECTS  . Sulfonamide Derivatives Other (See Comments)    Childhood allergy    Medications:  Current Outpatient Prescriptions on File Prior to Visit  Medication Sig Dispense Refill  . albuterol (PROVENTIL HFA;VENTOLIN HFA) 108 (90 BASE) MCG/ACT inhaler Inhale 2 puffs into the lungs every 6 (six) hours as needed for wheezing.      Marland Kitchen albuterol (PROVENTIL) (2.5 MG/3ML) 0.083% nebulizer solution Take 2.5 mg by nebulization every 6 (six) hours as needed for wheezing or shortness of breath.       Marland Kitchen aspirin 81 MG tablet Take 1 tablet (81 mg total) by mouth every morning.      . clopidogrel (PLAVIX) 75 MG tablet Take 75 mg by mouth every morning.      . furosemide (LASIX) 20 MG tablet Take 1 tablet (20 mg total) by mouth as needed.  30 tablet  1  . Guaifenesin 1200 MG TB12 Take 1 tablet (1,200 mg total) by mouth 2 (two) times daily.  20 each   0  . HYDROcodone-homatropine (HYDROMET) 5-1.5 MG/5ML syrup Take 5 mLs by mouth every 6 (six) hours as needed for cough.  240 mL  0  . ibuprofen (ADVIL,MOTRIN) 200 MG tablet Take 400 mg by mouth every 6 (six) hours as needed.      . insulin aspart (NOVOLOG) 100 unit/ml SOLN Inject 5 Units into the skin 3 (three) times daily with meals.       . insulin glargine (LANTUS) 100 UNIT/ML injection Inject 0.12 mLs (12 Units total) into the skin daily.  10 mL  12  . ipratropium (ATROVENT) 0.02 % nebulizer solution Take 2.5 mLs (0.5 mg total) by nebulization 3 (three) times daily.  225 mL  5  . isosorbide mononitrate (IMDUR) 30 MG 24 hr tablet Take 1 tablet (30 mg total) by mouth daily.  30 tablet  3  . JENTADUETO 2.03-999 MG TABS Take 1 tablet by mouth 2 (two) times daily.       Marland Kitchen levofloxacin (LEVAQUIN) 500 MG tablet Take 1 tablet (500 mg total) by mouth daily.  7 tablet  0  . LORazepam (ATIVAN) 0.5 MG tablet Take 0.5 mg by mouth 2 (two) times daily as needed for anxiety.      . metoprolol succinate (TOPROL-XL) 25 MG 24 hr tablet Take 1 tablet (25 mg total) by mouth daily.  30 tablet  3  . mometasone-formoterol (DULERA) 200-5 MCG/ACT AERO Inhale 2 puffs into the lungs 2 (two) times daily.      . nitroGLYCERIN (NITROSTAT) 0.4 MG SL tablet Place 1 tablet (0.4 mg total) under the tongue every 5 (five) minutes as needed. For chest pain.  25 tablet  6  . oxyCODONE-acetaminophen (PERCOCET/ROXICET) 5-325 MG per tablet Take 1 tablet by mouth every 4 (four) hours as needed for severe pain (TAKE ONE TABLET TWICE AS NEEDED).      Marland Kitchen PARoxetine (PAXIL) 10 MG tablet Take 10 mg by mouth daily.      . pravastatin (PRAVACHOL) 40 MG tablet Take 1 tablet (40 mg total) by mouth every evening.  30 tablet  6  . predniSONE (DELTASONE) 10 MG tablet 4 tabs for 2 days, then 3 tabs for 2 days, 2 tabs for 2 days, then 1 tab for 2 days, then stop  20 tablet  0  . pregabalin (LYRICA) 75 MG capsule Take 75 mg by mouth 2 (two) times daily.         . rivastigmine (EXELON) 9.5 mg/24hr Place 1 patch onto the skin every morning.       . senna-docusate (SENOKOT-S) 8.6-50 MG per tablet Take  2 tablets by mouth at bedtime. Hold for diarrhea  60 tablet  1  . tolterodine (DETROL LA) 4 MG 24 hr capsule Take 4 mg by mouth every morning.        No current facility-administered medications on file prior to visit.    ROS:  Out of a complete 14 system review of symptoms, the patient complains only of the following symptoms, and all other reviewed systems are negative.  Activity change, appetite change, fatigue Cough, wheezing, shortness of breath Constipation Incontinence of bladder Joint pain, back pain, achy muscles, walking difficulties Memory loss, dizziness, headache, tremors Agitation, confusion, nervousness  Blood pressure 129/76, pulse 95, weight 145 lb (65.772 kg).  Physical Exam  General: The patient is alert and cooperative at the time of the examination.  Skin: No significant peripheral edema is noted.   Neurologic Exam  Mental status: The Mini-Mental status examination done today shows a total score 23/30.  Cranial nerves: Facial symmetry is present. Speech is normal, no aphasia or dysarthria is noted. Extraocular movements are full. Visual fields are full.  Motor: The patient has good strength in all 4 extremities.  Sensory examination: Soft touch sensation on the face, arms, and legs is symmetric.  Coordination: The patient has good finger-nose-finger and heel-to-shin bilaterally.  Gait and station: The patient has a slightly wide-based, unsteady gait. The patient does not use a cane for ambulation. Tandem gait was not attempted. Romberg is unsteady. No drift is seen.  Reflexes: Deep tendon reflexes are symmetric.   Assessment/Plan:  1. Progressive dementia  The patient is tolerating the Exelon patch well. The patient appears to be stable with her Mini-Mental status examination, but the patient does  require supervision. The patient is not operating a motor vehicle at this time. The patient will continue the Exelon for now, and she will followup in about 8 months.  Marlan Palau MD 10/31/2013 8:41 AM  [facility] [address] [place], [place] [address]  Phone [phone] Fax [phone]

## 2013-11-04 NOTE — Assessment & Plan Note (Signed)
Slow to resolve exacerbation in active smoker  Prone to Frequent admissions Smoking cessation emphasized   Plan  Must quit smoking completely.  Finish Levaquin .  Prednisone taper over next week.  Begin Probiotic daily  Gas x as needed  Miralax As needed  constiopation .  Mucinex DM Twice daily  As needed  Cough/congestion  Continue on Dulera 2 puffs Twice daily   Add Ipratropium Neb Three times a day  .  May use Hydromet 1/2-1 tsp every 8hr As needed  Cough , may make you sleepy (do not use with other pain meds )  Follow up Dr. [name] 4 weeks and As needed   Follow up for CT scan next week.  Please contact office for sooner follow up if symptoms do not improve or worsen or seek emergency care

## 2013-11-05 ENCOUNTER — Ambulatory Visit (INDEPENDENT_AMBULATORY_CARE_PROVIDER_SITE_OTHER)
Admission: RE | Admit: 2013-11-05 | Discharge: 2013-11-05 | Disposition: A | Discharge status: Home / Self Care | Payer: Medicare Other | Source: Ambulatory Visit | Attending: Internal Medicine | Admitting: Internal Medicine

## 2013-11-05 DIAGNOSIS — R918 Other nonspecific abnormal finding of lung field: Secondary | ICD-10-CM

## 2013-12-02 ENCOUNTER — Ambulatory Visit (INDEPENDENT_AMBULATORY_CARE_PROVIDER_SITE_OTHER): Payer: Medicare Other | Admitting: Internal Medicine

## 2013-12-02 ENCOUNTER — Encounter: Payer: Self-pay | Admitting: Internal Medicine

## 2013-12-02 VITALS — BP 118/64 | HR 93 | Ht 65.0 in | Wt 147.0 lb

## 2013-12-02 DIAGNOSIS — J449 Chronic obstructive pulmonary disease, unspecified: Secondary | ICD-10-CM

## 2013-12-02 NOTE — Progress Notes (Signed)
Subjective:    Patient ID: [name], female    DOB: [birth year], 73 y.o.   MRN: [id number]  HPI  1. Ex Heavy Smoker 2. 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 Refuses spiriva, flu shot, rehab - In 2014: MDI is dulera 3. AECOPD hx - Aug 2011  (office Rx)   OV 06/12/2013    Admitted earlier in [place] for 2 days with worsening cough and yellow sputum for  1 months and dx with LL PNAand AECOPD. Dc on abx and pred taper and improved but cough worsened again. 3 days ago startred on levaquin and pred taper (few week taper) by Ladona Mow, MD. Feeling fatigued overall. COPD CAT score is 29. Can make bed and overall fatigued  REC Have CXR today; will call with result. - > CLEARED Finish levaquin and prednisone ordered by Vidal Schwalbe, MD Return to see me or NP [name]  in 3-4 weeks with COPD CAT Score at followup  07/10/2013 Follow up   [name], after a left lower lobe pneumonia. She finished her antibiotics with Levaquin. Chest x-ray showed a clearance of her left lower lobe infiltrate. Patient states that she is improved with decreased shortness, of breath, and congestion. However, continues to have a persistent dry cough. She is requesting a cough syrup. She is taking Mucinex DM without much relief. She is also use Tessalon the past, but has been intolerant to this. Patient denies any hemoptysis, orthopnea, PND, or increased leg swelling. Unfortunately, patient continues to smoke. Smoking cessation was discussed in detail.  CAT score 26 today .   REC Must quit smoking .  Mucinex DM Twice daily As needed Cough/congestion  Continue on Dulera 2 puffs Twice daily  May use Hydromet 1/2-1 tsp every 8hr As needed Cough , may make you sleepy (do not use with other pain meds )  follow up Dr. Chase Caller in 2-3 months and As needed  Please contact office for sooner follow  up if symptoms do not improve or worsen or seek emergency care      OV 09/09/2013 Had acute  shortness of breath and was taken to the emergency department 07/22/2013. Blood work reviewed and showed normal chemistry and normal CBC. Is no troponin check. A chest x-ray showed possible left pleural effusion but otherwise hyperinflated chest. However to me the chest x-ray shows either a small effusion or left lower lobe atelectasis which is similar to July 2014 and improved from the one prior to that and she had a pneumonia diagnoses in early July 2014. I do note that she's never had a CT scan of the chest. Patient was discharged 10 days of Levaquin and a prednisone taper which he still on. SHe continues to be symptomatiuc wit CAT Scoire 26   Of note she continues to smoke   OV 09/09/2013  She presents with her grand daughter for followup. She had CT scan of the chest in 07/31/2013 that shows only right lower lobe resolving pneumonia. She still continues to have severe cough. There is a baseline element to it with periodic exacerbations. Granddaughter does not feel that Ruthe [name] helps is not completely. Cough is most dry. There is associated sinus drainage and ticklish sensation in the throat and she does not treat this. There is associated acid reflux. She is noted to be on beta blocker eyedrops but the granddaughter  says she's not compliant with this and does not think this is making her cough worse. Is only an occasional gag  Otherwise COPD is at baseline    10/30/2013 [facility] follow up  [name].  Patient was admitted November 21 through November 25, for a COPD exacerbation and CHF .  Admitted to [facility] where workup revealed new systolic dysfunction. ECHO with EF of 30-35%, diffuse hypokiniesis, grade 1 diastolic dysfunction, mild MR. Cardiology consulted for formal evaluation. Patients CHF regimen adjusted while inpatient (see medication Rx  below). She was placed on scheduled bronchodilators, IV steroids, and doxycycline for AECOPD. Empirically diuresed, BNP (mild elevation) & troponin trended (negative). Lower extremity dopplers were evaluated and negative for DVT.  Continues to have prod cough with yellow mucus, increased SOB, wheezing, chest tightness x2 days. Will finish Levaquin 500mg  on 12/14 by PCP.  Says that she does feel slightly better, however, continues to have some cough and congestion.  She denies any hemoptysis, orthopnea, PND, or increased leg swelling  Patient continues to smoke. Smoking cessation was encouraged.  Does complain of intermittent constipation  Patient had a CT scan planned for next week to followup lung nodules.    OV 12/02/2013  Chief Complaint  Patient presents with  . Follow-up    Pt states atrovent neb really helped her sob and cough. Pt is not using it as needed.  Pt is currently on cipro for UTI.     Presents with gran-niece [name]. She is doing well after dx of CHF and starteing lasix in Nov 2014. aLso dx with stenotrohomonas around thanskgiving dyring CHF admit and Rx with levaquin. Then early dec 2014 started on atrovent neb and after that dyspnea mich improved but  Grand-niece says hard to get her to be compliant so not doing atrovent. Overall well now. COPD CAT score is 9 and best ever    CAT COPD Symptom & Quality of Life Score ([employer] trademark) 0 is no burden. 5 is highest burden 06/12/2013  09/09/2013  12/02/2013   Never Cough -> Cough all the time 4 5 1   No phlegm in chest -> Chest is full of phlegm 3 2 0  No chest tightness -> Chest feels very tight 2 0 0  No dyspnea for 1 flight stairs/hill -> Very dyspneic for 1 flight of stairs 4 5 2   No limitations for ADL at home -> Very limited with ADL at home 3 3 2   Confident leaving home -> Not at all confident leaving home 3 2 0  Sleep soundly -> Do not sleep soundly because of lung condition 5 4 1   Lots of Energy -> No energy at all 5 5  3   TOTAL Score (max 40)  29 26 9    CT chest 11/05/13   - IMPRESSION:  1. Interval improvement an right lower lobe airspace consolidation.  2. Persistent, bilateral lower lobe tree-in-bud nodularity and  bronchial wall thickening which likely reflects chronic, I  inflammatory or infectious bronchiolitis.  3. 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  On: 11/05/2013 13:21    Review of Systems  Constitutional: Negative for fever and unexpected weight change.  HENT: Negative for congestion, dental problem, ear pain, nosebleeds, postnasal drip, rhinorrhea, sinus pressure, sneezing, sore throat and trouble swallowing.   Eyes: Negative for redness and itching.  Respiratory: Positive for cough and shortness of breath. Negative for chest tightness and  wheezing.   Cardiovascular: Negative for palpitations and leg swelling.  Gastrointestinal: Negative for nausea and vomiting.  Genitourinary: Negative for dysuria.  Musculoskeletal: Negative for joint swelling.  Skin: Negative for rash.  Neurological: Negative for headaches.  Hematological: Does not bruise/bleed easily.  Psychiatric/Behavioral: Negative for dysphoric mood. The patient is not nervous/anxious.        Objective:   Physical Exam  Vitals reviewed. Constitutional: She is oriented to person, place, and time. She appears well-developed and well-nourished. No distress.  HENT:  Head: Normocephalic and atraumatic.  Right Ear: External ear normal.  Left Ear: External ear normal.  Mouth/Throat: Oropharynx is clear and moist. No oropharyngeal exudate.  Eyes: Conjunctivae and EOM are normal. Pupils are equal, round, and reactive to light. Right eye exhibits no discharge. Left eye exhibits no discharge. No scleral icterus.  Neck: Normal range of motion. Neck supple. No JVD present. No tracheal deviation present. No thyromegaly present.  Cardiovascular: Normal  rate, regular rhythm, normal heart sounds and intact distal pulses.  Exam reveals no gallop and no friction rub.   No murmur heard. Pulmonary/Chest: Effort normal and breath sounds normal. No respiratory distress. She has no wheezes. She has no rales. She exhibits no tenderness.  Abdominal: Soft. Bowel sounds are normal. She exhibits no distension and no mass. There is no tenderness. There is no rebound and no guarding.  Musculoskeletal: Normal range of motion. She exhibits no edema and no tenderness.  Lymphadenopathy:    She has no cervical adenopathy.  Neurological: She is alert and oriented to person, place, and time. She has normal reflexes. No cranial nerve deficit. She exhibits normal muscle tone. Coordination normal.  Skin: Skin is warm and dry. No rash noted. She is not diaphoretic. No erythema. No pallor.  Psychiatric: Judgment normal.  Frail female          Assessment & Plan:

## 2013-12-02 NOTE — Patient Instructions (Addendum)
COPD is stable You are much improved after diagnosis of CHF and treatment of it Continue all your medications USe atrovent nebulizer 4 times a daily + dulera REturn in 3 months or sooner if needed

## 2013-12-04 ENCOUNTER — Ambulatory Visit (HOSPITAL_COMMUNITY): Payer: Medicare Other | Attending: Physician Assistant | Admitting: Radiology

## 2013-12-04 ENCOUNTER — Encounter: Payer: Self-pay | Admitting: Cardiology

## 2013-12-04 VITALS — BP 136/85 | HR 71 | Ht 65.0 in | Wt 146.0 lb

## 2013-12-04 DIAGNOSIS — R079 Chest pain, unspecified: Secondary | ICD-10-CM | POA: Insufficient documentation

## 2013-12-04 DIAGNOSIS — I252 Old myocardial infarction: Secondary | ICD-10-CM | POA: Insufficient documentation

## 2013-12-04 DIAGNOSIS — I10 Essential (primary) hypertension: Secondary | ICD-10-CM | POA: Insufficient documentation

## 2013-12-04 DIAGNOSIS — R002 Palpitations: Secondary | ICD-10-CM | POA: Insufficient documentation

## 2013-12-04 DIAGNOSIS — R42 Dizziness and giddiness: Secondary | ICD-10-CM | POA: Insufficient documentation

## 2013-12-04 DIAGNOSIS — E785 Hyperlipidemia, unspecified: Secondary | ICD-10-CM | POA: Insufficient documentation

## 2013-12-04 DIAGNOSIS — Z794 Long term (current) use of insulin: Secondary | ICD-10-CM | POA: Insufficient documentation

## 2013-12-04 DIAGNOSIS — R0602 Shortness of breath: Secondary | ICD-10-CM

## 2013-12-04 DIAGNOSIS — Z87891 Personal history of nicotine dependence: Secondary | ICD-10-CM | POA: Insufficient documentation

## 2013-12-04 DIAGNOSIS — R0989 Other specified symptoms and signs involving the circulatory and respiratory systems: Secondary | ICD-10-CM | POA: Insufficient documentation

## 2013-12-04 DIAGNOSIS — Z8249 Family history of ischemic heart disease and other diseases of the circulatory system: Secondary | ICD-10-CM | POA: Insufficient documentation

## 2013-12-04 DIAGNOSIS — R0609 Other forms of dyspnea: Secondary | ICD-10-CM | POA: Insufficient documentation

## 2013-12-04 DIAGNOSIS — E119 Type 2 diabetes mellitus without complications: Secondary | ICD-10-CM | POA: Insufficient documentation

## 2013-12-04 DIAGNOSIS — I2510 Atherosclerotic heart disease of native coronary artery without angina pectoris: Secondary | ICD-10-CM

## 2013-12-04 MED ORDER — TECHNETIUM TC 99M SESTAMIBI GENERIC - CARDIOLITE
30.0000 | Freq: Once | INTRAVENOUS | Status: AC | PRN
  Administered 2013-12-04: 30 via INTRAVENOUS

## 2013-12-04 MED ORDER — TECHNETIUM TC 99M SESTAMIBI GENERIC - CARDIOLITE
10.0000 | Freq: Once | INTRAVENOUS | Status: AC | PRN
  Administered 2013-12-04: 10 via INTRAVENOUS

## 2013-12-04 MED ORDER — REGADENOSON 0.4 MG/5ML IV SOLN
0.4000 mg | Freq: Once | INTRAVENOUS | Status: AC
Start: 2013-12-04 — End: 2013-12-04
  Administered 2013-12-04: 0.4 mg via INTRAVENOUS

## 2013-12-04 NOTE — Progress Notes (Addendum)
[name] [address] [place], [place] [address] [phone]    Cardiology Nuclear Med Study  [name] is a 73 y.o. female     MRN : [id number]     DOB: 1940-12-29  Procedure Date: 12/04/2013  Nuclear Med Background Indication for Stress Test:  Evaluation for Ischemia, Stent Patency and [facility] 09-2013 Acute Heart Failure, (-) enzymes History: MI, Stent of RCA, and [birth year] Echo: EF=30-35%  Cardiac Risk Factors: Family History - CAD, History of Smoking, Hypertension, IDDM,and Lipids  Symptoms: Chest Pain with/without exertion (last occurrence couple weeks ago), DOE, Light-Headedness and Palpitations   Nuclear Pre-Procedure Caffeine/Decaff Intake:  None NPO After: 8:00pm   Lungs:  clear O2 Sat: 90-92% after deep breaths on room air. Patient took 2 sprays of Proair inhaler prior to [employer]. O2 at 2L [place] given during Lexiscan. IV 0.9% NS with Angio Cath:  22g  IV Site: R Hand  IV Started by:  Crissie Figures, RN  Chest Size (in):  38 Cup Size: C  Height: 5\' 5"  (1.651 m)  Weight:  146 lb (66.225 kg)  BMI:  Body mass index is 24.3 kg/(m^2). Tech Comments:  BS @ 6 am 130.     Nuclear Med Study 1 or 2 day study: 1 day  Stress Test Type:  Lexiscan  Reading MD: N/A  Order Authorizing Provider:  Lauree Chandler, MD  Resting Radionuclide: Technetium 32m Sestamibi  Resting Radionuclide Dose: 11.0 mCi   Stress Radionuclide:  Technetium 28m Sestamibi  Stress Radionuclide Dose: 33.0 mCi           Stress Protocol Rest HR: 71 Stress HR: 95  Rest BP: 136/85 Stress BP: 150/91  Exercise Time (min): n/a METS: n/a   Predicted Max HR: 148 bpm % Max HR: 64.19 bpm Rate Pressure Product: 14250   Dose of Adenosine (mg):  n/a Dose of Lexiscan: 0.4 mg  Dose of Atropine (mg): n/a Dose of Dobutamine: n/a mcg/kg/min (at max HR)  Stress Test Technologist: Irven Baltimore, RN  Nuclear Technologist:  Charlton Amor, CNMT     Rest Procedure:   Myocardial perfusion imaging was performed at rest 45 minutes following the intravenous administration of Technetium 48m Sestamibi. Rest ECG: NSR, rate 81bpm, poor R wave progression.   Stress Procedure:  The patient received IV Lexiscan 0.4 mg over 15-seconds.  Technetium 59m Sestamibi injected at 30-seconds. The patient complained of SOB, stomach ache, and headache with Lexiscan, but denied chest pain. Quantitative spect images were obtained after a 45 minute delay. Stress ECG: No significant change from baseline ECG  QPS Raw Data Images:  Normal; no motion artifact; normal heart/lung ratio. Stress Images:  There is a moderate sized fixed defect along base to mid inferolateral distribution. Possible artifact from arm positioning.  Rest Images:  There is a moderate sized fixed defect along base to mid inferolateral distribution. Subtraction (SDS):  No evidence of ischemia. Transient Ischemic Dilatation (Normal <1.22):  1.06 Lung/Heart Ratio (Normal <0.45):  0.46  Quantitative Gated Spect Images QGS EDV:  n/a QGS ESV:  n/a  Impression Exercise Capacity:  Lexiscan with no exercise. BP Response:  Normal blood pressure response. Clinical Symptoms:  Typical symptoms with Lexiscan.  ECG Impression:  No ST changes during infusion. Rare PAC, PVC. Comparison with Prior Nuclear Study: No previous nuclear study performed  Overall Impression:  Low risk stress nuclear study with overall decreased sensitivity from arm positioning (arms along patient's side)..No ischemia noted. 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 There is no large area of inferior ischemia to suggest RCA stent stenosis.   LV Ejection Fraction: Study not gated.  LV Wall Motion:  study not gated  Candee Furbish, MD

## 2013-12-05 ENCOUNTER — Encounter: Payer: Self-pay | Admitting: Physician Assistant

## 2013-12-07 NOTE — Assessment & Plan Note (Signed)
COPD is stable You are much improved after diagnosis of CHF and treatment of it Continue all your medications USe atrovent nebulizer 4 times a daily + dulera REturn in 3 months or sooner if needed 

## 2013-12-12 ENCOUNTER — Ambulatory Visit (INDEPENDENT_AMBULATORY_CARE_PROVIDER_SITE_OTHER): Payer: Medicare Other | Admitting: Cardiovascular Disease

## 2013-12-12 ENCOUNTER — Encounter: Payer: Self-pay | Admitting: Cardiovascular Disease

## 2013-12-12 VITALS — BP 126/84 | HR 88 | Ht 65.0 in | Wt 146.0 lb

## 2013-12-12 DIAGNOSIS — E785 Hyperlipidemia, unspecified: Secondary | ICD-10-CM

## 2013-12-12 DIAGNOSIS — I2510 Atherosclerotic heart disease of native coronary artery without angina pectoris: Secondary | ICD-10-CM

## 2013-12-12 DIAGNOSIS — I5022 Chronic systolic (congestive) heart failure: Secondary | ICD-10-CM

## 2013-12-12 DIAGNOSIS — I10 Essential (primary) hypertension: Secondary | ICD-10-CM

## 2013-12-12 DIAGNOSIS — I255 Ischemic cardiomyopathy: Secondary | ICD-10-CM

## 2013-12-12 DIAGNOSIS — R002 Palpitations: Secondary | ICD-10-CM

## 2013-12-12 DIAGNOSIS — I2589 Other forms of chronic ischemic heart disease: Secondary | ICD-10-CM

## 2013-12-12 NOTE — Progress Notes (Signed)
History of Present Illness: 73 yo female with history of DM2, HTN, HL, tobacco abuse and CAD here today for cardiac follow up. She was admitted February 2012 with an acute inferior STEMI. Emergent cath 12/28/10 with serial lesions in subtotally occluded RCA. We were unable to stent the vessel during the index procedure because of severe calcification in the mid vessel. Flow was established with balloon inflations and she was treated with Integrilin, Effient and ASA. She had staged PCI 12/31/10 at which time a rotablator atherectomy of the mid RCA was performed. She did well with both procedures. Two overlapping drug eluting stents were placed in the mid RCA. EF was found to be 45 % at the time of the cath. There was mild disease in the LAD and the Circumflex arteries. Echo 5/12: EF 55-60%, grade 1 diastolic dysfunction, mild MR, mild LAE. Complaint of palpitations 10/30/12. 21 day monitor showed no arrythmias. Admitted to [facility] 11/21-11/25/14 with acute systolic heart failure in the setting of acute exacerbation of COPD. Cardiac enzymes were negative. Echocardiogram (10/10/13): EF 30-35%, diffuse HK with moderate inferior HK, grade 1 diastolic dysfunction, mild MR. Patient was seen by cardiology and diuresed. She was not placed on ACE inhibitor or ARB given prior history of respiratory arrest with ACE inhibitor therapy. She was placed on spironolactone. She was placed on long-acting beta blocker. Long-acting nitrates were added. Dr. Graciela Husbands saw the patient in the hospital and did recommend considering outpatient stress testing to rule out ischemia as a cause of her reduced ejection fraction. Stress myoview 12/04/13 with no ischemia, LVEF not calculated. (low risk study).  There was also some thought given to whether or not an implantable loop recorder should be placed to rule out the possibility of atrial fibrillation (negative monitor in the recent past) with her history of palpitations but ultimately this was not  felt to be necessary. Dr. Graciela Husbands recommended considering an ILR to rule out AFib if she has recurrent palpitations. She did not tolerate spironolactone. Started on prn Lasix when seen here on 10/28/13 by Tereso Newcomer, PA-C.   She is here today for follow up. She tells me that she is feeling well. No chest pain or SOB, palpitations. No near syncope or syncope. Weight is stable at home. No LE edema.   Primary Care Physician: Laurann Montana   Last Lipid Profile:Lipid Panel     Component Value Date/Time   CHOL 69 12/19/2012 1302   TRIG 138.0 12/19/2012 1302   HDL 31.30* 12/19/2012 1302   CHOLHDL 2 12/19/2012 1302   VLDL 27.6 12/19/2012 1302   LDLCALC 10 12/19/2012 1302     Past Medical History  Diagnosis Date  . CAD (coronary artery disease)     a. s/p inferior STEMI 12/29/10 with rotablator atherectomy RCA 12/31/10 and DES x 2 RCA;  b. Lexiscan Myoview (11/2013):  Low risk study, no ischemia, inferolateral defect likely consistent with attenuation versus scar.  . Ischemic cardiomyopathy     EF 45%cath 2012 EF55% 5/12; 35% 11/14  . Diabetes mellitus     type 2  . HTN (hypertension)   . Hyperlipidemia   . Diverticulosis   . Respiratory arrest//ACE Inhibitor presumed cause   . COPD (chronic obstructive pulmonary disease)   . Small bowel obstruction     from a sigmoid stricture  . Nephrolithiasis   . Sleep apnea   . Overweight   . Osteoarthritis   . GERD (gastroesophageal reflux disease)   . Anxiety   .  Glaucoma   . Memory deficit     Past Surgical History  Procedure Laterality Date  . Bilateral tubal ligation    . Lithotripsy    . 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 Cardiac surgery      Current Outpatient Prescriptions  Medication Sig Dispense Refill  . albuterol (PROVENTIL HFA;VENTOLIN HFA) 108 (90 BASE) MCG/ACT inhaler Inhale 2 puffs into the lungs every 6 (six) hours as needed for wheezing.      Marland Kitchen albuterol (PROVENTIL) (2.5 MG/3ML) 0.083%  nebulizer solution Take 2.5 mg by nebulization every 6 (six) hours as needed for wheezing or shortness of breath.       Marland Kitchen aspirin 81 MG tablet Take 1 tablet (81 mg total) by mouth every morning.      . clopidogrel (PLAVIX) 75 MG tablet Take 75 mg by mouth every morning.      . furosemide (LASIX) 20 MG tablet Take 1 tablet (20 mg total) by mouth as needed.  30 tablet  1  . ibuprofen (ADVIL,MOTRIN) 200 MG tablet Take 400 mg by mouth every 6 (six) hours as needed.      . insulin aspart (NOVOLOG) 100 unit/ml SOLN Inject 5 Units into the skin 3 (three) times daily with meals.       . insulin glargine (LANTUS) 100 UNIT/ML injection Inject 0.12 mLs (12 Units total) into the skin daily.  10 mL  12  . ipratropium (ATROVENT) 0.02 % nebulizer solution Take 0.5 mg by nebulization. Four times daily      . isosorbide mononitrate (IMDUR) 30 MG 24 hr tablet Take 1 tablet (30 mg total) by mouth daily.  30 tablet  3  . JENTADUETO 2.03-999 MG TABS Take 1 tablet by mouth 2 (two) times daily.       Marland Kitchen LORazepam (ATIVAN) 0.5 MG tablet Take 0.5 mg by mouth 2 (two) times daily as needed for anxiety.      . metoprolol succinate (TOPROL-XL) 25 MG 24 hr tablet Take 1 tablet (25 mg total) by mouth daily.  30 tablet  3  . mometasone-formoterol (DULERA) 200-5 MCG/ACT AERO Inhale 2 puffs into the lungs 2 (two) times daily.      . nitroGLYCERIN (NITROSTAT) 0.4 MG SL tablet Place 1 tablet (0.4 mg total) under the tongue every 5 (five) minutes as needed. For chest pain.  25 tablet  6  . oxyCODONE-acetaminophen (PERCOCET/ROXICET) 5-325 MG per tablet Take 1 tablet by mouth every 4 (four) hours as needed for severe pain (TAKE ONE TABLET TWICE AS NEEDED).      Marland Kitchen PARoxetine (PAXIL) 10 MG tablet Take 10 mg by mouth daily.      . pravastatin (PRAVACHOL) 40 MG tablet Take 1 tablet (40 mg total) by mouth every evening.  30 tablet  6  . pregabalin (LYRICA) 75 MG capsule Take 75 mg by mouth 2 (two) times daily.        . rivastigmine (EXELON)  9.5 mg/24hr Place 1 patch onto the skin every morning.       . senna-docusate (SENOKOT-S) 8.6-50 MG per tablet Take 2 tablets by mouth at bedtime. Hold for diarrhea  60 tablet  1  . tolterodine (DETROL LA) 4 MG 24 hr capsule Take 4 mg by mouth every morning.        No current facility-administered medications for this visit.    Allergies  Allergen Reactions  . Lisinopril     REACTION: respiratory arrest jan 2009  .  Eggs Or Egg-Derived Products Diarrhea  . Erythromycin Other (See Comments)    Makes me feel weird   . Iodine     Pt may be allergic to iodine, does not remember the details, refuses contrast-CS  . Penicillins Itching and Other (See Comments)    Too much as a kid  . Pentazocine Lactate Other (See Comments)    Reaction unknown  . Propoxyphene Hcl Other (See Comments)    Reaction unknown  . Quinapril Hcl Itching and Other (See Comments)    Too much   . Spironolactone     PATIENT HAD SEVERE SIDE EFFECTS  . Sulfonamide Derivatives Other (See Comments)    Childhood allergy    History   Social History  . Marital Status: Widowed    Spouse Name: N/A    Number of Children: N/A  . Years of Education: N/A   Occupational History  . Retired     Haematologist    Social History Main Topics  . Smoking status: Former Smoker -- 0.50 packs/day for 25 years    Types: Cigarettes    Quit date: 10/07/2013  . Smokeless tobacco: Never Used     Comment: over a ppd for 40+ years; quit 10/2004.  unsuccessfully tried eCigs.  . Alcohol Use: 0.5 oz/week    1 drink(s) per week     Comment: rare   . Drug Use: No  . Sexual Activity: No   Other Topics Concern  . Not on file   Social History Narrative  . No narrative on file    Family History  Problem Relation Age of Onset  . Breast cancer Maternal Grandmother   . Diabetes Father   . Heart disease Father   . Diabetes Sister   . Colon cancer Neg Hx     Review of Systems:  As stated in the HPI and otherwise  negative.   BP 126/84  Pulse 88  Ht 5\' 5"  (1.651 m)  Wt 146 lb (66.225 kg)  BMI 24.30 kg/m2  SpO2 96%  Physical Examination: General: Well developed, well nourished, NAD HEENT: OP clear, mucus membranes moist SKIN: warm, dry. No rashes. Neuro: No focal deficits Musculoskeletal: Muscle strength 5/5 all ext Psychiatric: Mood and affect normal Neck: No JVD, no carotid bruits, no thyromegaly, no lymphadenopathy. Lungs:Clear bilaterally, no wheezes, rhonci, crackles Cardiovascular: Regular rate and rhythm. No murmurs, gallops or rubs. Abdomen:Soft. Bowel sounds present. Non-tender.  Extremities: No lower extremity edema. Pulses are 2 + in the bilateral DP/PT.  Echo 10/10/13: Left ventricle: The cavity size was normal. Wall thickness was normal. Systolic function was moderately to severely reduced. The estimated ejection fraction was in the range of 30% to 35%. Diffuse hypokinesis. There is moderate hypokinesis of the inferior myocardium. Doppler parameters are consistent with abnormal left ventricular relaxation (grade 1 diastolic dysfunction). - Mitral valve: Mild regurgitation.  Stress myoview 12/04/13: Stress Procedure: The patient received IV Lexiscan 0.4 mg over 15-seconds. Technetium 59m Sestamibi injected at 30-seconds. The patient complained of SOB, stomach ache, and headache with Lexiscan, but denied chest pain. Quantitative spect images were obtained after a 45 minute delay.  Stress ECG: No significant change from baseline ECG  QPS  Raw Data Images: Normal; no motion artifact; normal heart/lung ratio.  Stress Images: There is a moderate sized fixed defect along base to mid inferolateral distribution. Possible artifact from arm positioning.  Rest Images: There is a moderate sized fixed defect along base to mid inferolateral distribution.  Subtraction (  SDS): No evidence of ischemia.  Transient Ischemic Dilatation (Normal <1.22): 1.06  Lung/Heart Ratio (Normal <0.45): 0.46    Quantitative Gated Spect Images  QGS EDV: n/a  QGS ESV: n/a  Impression  Exercise Capacity: Lexiscan with no exercise.  BP Response: Normal blood pressure response.  Clinical Symptoms: Typical symptoms with Lexiscan.  ECG Impression: No ST changes during infusion. Rare PAC, PVC.  Comparison with Prior Nuclear Study: No previous nuclear study performed  Overall Impression: Low risk stress nuclear study with overall decreased sensitivity from arm positioning (arms along patient's side)..No ischemia noted. Inferolateral defect likely from arm attenuation/ old inferior STEMI. There is no large area of inferior ischemia to suggest RCA stent stenosis.  LV Ejection Fraction: Study not gated. LV Wall Motion: study not gated  Assessment and Plan:   1. Chronic Systolic CHF: Overall stable. Weight is stable. Continue to use Lasix as needed and follow weights daily at home. She will call if her weight increases by 3 lbs in one day.   2.  Ischemic Cardiomyopathy: LVEF=35% during recent acute illness. No new wall motion abnormalities on echo. She cannot take ACEI/ARB due to hx of severe reaction in the past. She did not tolerate spironolactone. She remains on beta blocker and nitrates. Will not re-challenge with spironolactone.   3. CAD: No angina. Continue ASA, Plavix, statin.No ischemia on stress test 12/04/13.   4.  COPD:  Followed by Dr. Chase Caller.   5. Hypertension: Controlled. No changes.   6. Hyperlipidemia: continue statin.   7. Palpitations: She had a negative monitor in 10/2012. Dr. Virl Axe recommended considering an ILR to rule out AFib if she has recurrent palpitations.

## 2013-12-12 NOTE — Patient Instructions (Signed)
Your physician wants you to follow-up in:  6 months. You will receive a reminder letter in the mail two months in advance. If you don't receive a letter, please call our office to schedule the follow-up appointment.   

## 2014-01-14 ENCOUNTER — Other Ambulatory Visit: Payer: Self-pay | Admitting: Cardiovascular Disease

## 2014-01-23 ENCOUNTER — Inpatient Hospital Stay (HOSPITAL_COMMUNITY): Payer: Medicare Other

## 2014-01-23 ENCOUNTER — Encounter (HOSPITAL_COMMUNITY): Payer: Self-pay | Admitting: Emergency Medicine

## 2014-01-23 ENCOUNTER — Inpatient Hospital Stay (HOSPITAL_COMMUNITY)
Admission: EM | Admit: 2014-01-23 | Discharge: 2014-01-25 | DRG: 193 | Disposition: A | Discharge status: Home Health | Payer: Medicare Other | Attending: Internal Medicine | Admitting: Internal Medicine

## 2014-01-23 ENCOUNTER — Emergency Department (HOSPITAL_COMMUNITY): Payer: Medicare Other

## 2014-01-23 DIAGNOSIS — R062 Wheezing: Secondary | ICD-10-CM | POA: Diagnosis present

## 2014-01-23 DIAGNOSIS — W19XXXA Unspecified fall, initial encounter: Secondary | ICD-10-CM | POA: Diagnosis present

## 2014-01-23 DIAGNOSIS — M25559 Pain in unspecified hip: Secondary | ICD-10-CM | POA: Diagnosis present

## 2014-01-23 DIAGNOSIS — E875 Hyperkalemia: Secondary | ICD-10-CM | POA: Diagnosis present

## 2014-01-23 DIAGNOSIS — K219 Gastro-esophageal reflux disease without esophagitis: Secondary | ICD-10-CM | POA: Diagnosis present

## 2014-01-23 DIAGNOSIS — Y92009 Unspecified place in unspecified non-institutional (private) residence as the place of occurrence of the external cause: Secondary | ICD-10-CM

## 2014-01-23 DIAGNOSIS — IMO0001 Reserved for inherently not codable concepts without codable children: Secondary | ICD-10-CM | POA: Diagnosis present

## 2014-01-23 DIAGNOSIS — Z7982 Long term (current) use of aspirin: Secondary | ICD-10-CM

## 2014-01-23 DIAGNOSIS — I2510 Atherosclerotic heart disease of native coronary artery without angina pectoris: Secondary | ICD-10-CM | POA: Diagnosis present

## 2014-01-23 DIAGNOSIS — J441 Chronic obstructive pulmonary disease with (acute) exacerbation: Secondary | ICD-10-CM | POA: Diagnosis present

## 2014-01-23 DIAGNOSIS — I509 Heart failure, unspecified: Secondary | ICD-10-CM | POA: Diagnosis present

## 2014-01-23 DIAGNOSIS — Z803 Family history of malignant neoplasm of breast: Secondary | ICD-10-CM

## 2014-01-23 DIAGNOSIS — F0390 Unspecified dementia without behavioral disturbance: Secondary | ICD-10-CM | POA: Diagnosis present

## 2014-01-23 DIAGNOSIS — J9811 Atelectasis: Secondary | ICD-10-CM | POA: Diagnosis present

## 2014-01-23 DIAGNOSIS — M797 Fibromyalgia: Secondary | ICD-10-CM | POA: Diagnosis present

## 2014-01-23 DIAGNOSIS — I252 Old myocardial infarction: Secondary | ICD-10-CM

## 2014-01-23 DIAGNOSIS — E785 Hyperlipidemia, unspecified: Secondary | ICD-10-CM | POA: Diagnosis present

## 2014-01-23 DIAGNOSIS — I10 Essential (primary) hypertension: Secondary | ICD-10-CM | POA: Diagnosis present

## 2014-01-23 DIAGNOSIS — E663 Overweight: Secondary | ICD-10-CM | POA: Diagnosis present

## 2014-01-23 DIAGNOSIS — I5022 Chronic systolic (congestive) heart failure: Secondary | ICD-10-CM

## 2014-01-23 DIAGNOSIS — J189 Pneumonia, unspecified organism: Principal | ICD-10-CM | POA: Diagnosis present

## 2014-01-23 DIAGNOSIS — G4730 Sleep apnea, unspecified: Secondary | ICD-10-CM | POA: Diagnosis present

## 2014-01-23 DIAGNOSIS — Z794 Long term (current) use of insulin: Secondary | ICD-10-CM

## 2014-01-23 DIAGNOSIS — E119 Type 2 diabetes mellitus without complications: Secondary | ICD-10-CM | POA: Diagnosis present

## 2014-01-23 DIAGNOSIS — Z87891 Personal history of nicotine dependence: Secondary | ICD-10-CM

## 2014-01-23 DIAGNOSIS — J9600 Acute respiratory failure, unspecified whether with hypoxia or hypercapnia: Secondary | ICD-10-CM | POA: Diagnosis present

## 2014-01-23 DIAGNOSIS — Z833 Family history of diabetes mellitus: Secondary | ICD-10-CM

## 2014-01-23 DIAGNOSIS — J449 Chronic obstructive pulmonary disease, unspecified: Secondary | ICD-10-CM

## 2014-01-23 DIAGNOSIS — Z8249 Family history of ischemic heart disease and other diseases of the circulatory system: Secondary | ICD-10-CM

## 2014-01-23 DIAGNOSIS — H409 Unspecified glaucoma: Secondary | ICD-10-CM | POA: Diagnosis present

## 2014-01-23 DIAGNOSIS — F411 Generalized anxiety disorder: Secondary | ICD-10-CM | POA: Diagnosis present

## 2014-01-23 HISTORY — DX: Unspecified dementia, unspecified severity, without behavioral disturbance, psychotic disturbance, mood disturbance, and anxiety: F03.90

## 2014-01-23 LAB — BASIC METABOLIC PANEL
BUN: 17 mg/dL (ref 6–23)
CO2: 26 meq/L (ref 19–32)
CREATININE: 0.67 mg/dL (ref 0.50–1.10)
Calcium: 10.2 mg/dL (ref 8.4–10.5)
Chloride: 97 mEq/L (ref 96–112)
GFR calc Af Amer: >90 mL/min (ref >90)
GFR calc non Af Amer: 86 mL/min — ABNORMAL LOW (ref >90)
Glucose, Bld: 96 mg/dL (ref 70–99)
POTASSIUM: 5.4 meq/L — AB (ref 3.7–5.3)
Sodium: 136 mEq/L — ABNORMAL LOW (ref 137–147)

## 2014-01-23 LAB — GLUCOSE, CAPILLARY: Glucose-Capillary: 130 mg/dL — ABNORMAL HIGH (ref 70–99)

## 2014-01-23 LAB — CBC WITH DIFFERENTIAL/PLATELET
BASOS ABS: 0 10*3/uL (ref 0.0–0.1)
Basophils Relative: 0 % (ref 0–1)
EOS ABS: 0.1 10*3/uL (ref 0.0–0.7)
Eosinophils Relative: 1 % (ref 0–5)
HCT: 42.6 % (ref 36.0–46.0)
Hemoglobin: 14.2 g/dL (ref 12.0–15.0)
LYMPHS PCT: 24 % (ref 12–46)
Lymphs Abs: 2.2 10*3/uL (ref 0.7–4.0)
MCH: 32.2 pg (ref 26.0–34.0)
MCHC: 33.3 g/dL (ref 30.0–36.0)
MCV: 96.6 fL (ref 78.0–100.0)
Monocytes Absolute: 0.6 10*3/uL (ref 0.1–1.0)
Monocytes Relative: 6 % (ref 3–12)
Neutro Abs: 6.5 10*3/uL (ref 1.7–7.7)
Neutrophils Relative %: 68 % (ref 43–77)
PLATELETS: 205 10*3/uL (ref 150–400)
RBC: 4.41 MIL/uL (ref 3.87–5.11)
RDW: 13.7 % (ref 11.5–15.5)
WBC: 9.5 10*3/uL (ref 4.0–10.5)

## 2014-01-23 LAB — PRO B NATRIURETIC PEPTIDE: PRO B NATRI PEPTIDE: 279.5 pg/mL — AB (ref 0–125)

## 2014-01-23 LAB — HEMOGLOBIN A1C
HEMOGLOBIN A1C: 6 % — AB (ref <5.7)
Mean Plasma Glucose: 126 mg/dL — ABNORMAL HIGH (ref <117)

## 2014-01-23 LAB — POTASSIUM: POTASSIUM: 4.6 meq/L (ref 3.7–5.3)

## 2014-01-23 MED ORDER — OXYCODONE-ACETAMINOPHEN 5-325 MG PO TABS: 1.0000 | ORAL_TABLET | ORAL | Status: DC | PRN

## 2014-01-23 MED ORDER — INSULIN ASPART 100 UNIT/ML ~~LOC~~ SOLN
0.0000 [IU] | Freq: Three times a day (TID) | SUBCUTANEOUS | Status: DC
  Administered 2014-01-24: 5 [IU] via SUBCUTANEOUS
  Administered 2014-01-24: 3 [IU] via SUBCUTANEOUS
  Administered 2014-01-24: 08:00:00 via SUBCUTANEOUS
  Administered 2014-01-25: 2 [IU] via SUBCUTANEOUS

## 2014-01-23 MED ORDER — CLOPIDOGREL BISULFATE 75 MG PO TABS
75.0000 mg | ORAL_TABLET | Freq: Every day | ORAL | Status: DC
  Administered 2014-01-24 – 2014-01-25 (×2): 75 mg via ORAL
  Filled 2014-01-23 (×3): qty 1

## 2014-01-23 MED ORDER — IPRATROPIUM-ALBUTEROL 0.5-2.5 (3) MG/3ML IN SOLN
3.0000 mL | Freq: Once | RESPIRATORY_TRACT | Status: AC
  Administered 2014-01-23: 3 mL via RESPIRATORY_TRACT
  Filled 2014-01-23: qty 3

## 2014-01-23 MED ORDER — DM-GUAIFENESIN ER 30-600 MG PO TB12
1.0000 | ORAL_TABLET | Freq: Two times a day (BID) | ORAL | Status: DC | PRN
  Filled 2014-01-23: qty 1

## 2014-01-23 MED ORDER — NITROGLYCERIN 0.4 MG SL SUBL: 0.4000 mg | SUBLINGUAL_TABLET | SUBLINGUAL | Status: DC | PRN

## 2014-01-23 MED ORDER — LORAZEPAM 0.5 MG PO TABS
0.5000 mg | ORAL_TABLET | Freq: Two times a day (BID) | ORAL | Status: DC | PRN
  Administered 2014-01-25: 0.5 mg via ORAL
  Filled 2014-01-23: qty 1

## 2014-01-23 MED ORDER — PREDNISONE 20 MG PO TABS
60.0000 mg | ORAL_TABLET | Freq: Once | ORAL | Status: AC
  Administered 2014-01-23: 60 mg via ORAL
  Filled 2014-01-23: qty 3

## 2014-01-23 MED ORDER — PREGABALIN 75 MG PO CAPS
75.0000 mg | ORAL_CAPSULE | Freq: Two times a day (BID) | ORAL | Status: DC
  Administered 2014-01-23 – 2014-01-25 (×4): 75 mg via ORAL
  Filled 2014-01-23 (×4): qty 1

## 2014-01-23 MED ORDER — ENOXAPARIN SODIUM 40 MG/0.4ML ~~LOC~~ SOLN
40.0000 mg | SUBCUTANEOUS | Status: DC
  Administered 2014-01-23 – 2014-01-24 (×2): 40 mg via SUBCUTANEOUS
  Filled 2014-01-23 (×4): qty 0.4

## 2014-01-23 MED ORDER — IPRATROPIUM BROMIDE 0.02 % IN SOLN
0.5000 mg | Freq: Four times a day (QID) | RESPIRATORY_TRACT | Status: DC
  Administered 2014-01-24 – 2014-01-25 (×6): 0.5 mg via RESPIRATORY_TRACT
  Filled 2014-01-23 (×6): qty 2.5

## 2014-01-23 MED ORDER — RIVASTIGMINE 9.5 MG/24HR TD PT24
9.5000 mg | MEDICATED_PATCH | Freq: Every morning | TRANSDERMAL | Status: DC
  Administered 2014-01-24 – 2014-01-25 (×2): 9.5 mg via TRANSDERMAL
  Filled 2014-01-23 (×3): qty 1

## 2014-01-23 MED ORDER — ASPIRIN 325 MG PO TABS: 81.0000 mg | ORAL_TABLET | Freq: Every morning | ORAL | Status: DC

## 2014-01-23 MED ORDER — DEXTROSE 5 % IV SOLN
1.0000 g | INTRAVENOUS | Status: DC
  Administered 2014-01-24: 1 g via INTRAVENOUS
  Filled 2014-01-23 (×2): qty 10

## 2014-01-23 MED ORDER — DEXTROSE 5 % IV SOLN
500.0000 mg | INTRAVENOUS | Status: DC
  Administered 2014-01-24: 500 mg via INTRAVENOUS
  Filled 2014-01-23 (×2): qty 500

## 2014-01-23 MED ORDER — METOPROLOL SUCCINATE ER 25 MG PO TB24
25.0000 mg | ORAL_TABLET | Freq: Every day | ORAL | Status: DC
  Administered 2014-01-23 – 2014-01-25 (×3): 25 mg via ORAL
  Filled 2014-01-23 (×4): qty 1

## 2014-01-23 MED ORDER — DEXTROSE 5 % IV SOLN
1.0000 g | Freq: Once | INTRAVENOUS | Status: AC
  Administered 2014-01-23: 1 g via INTRAVENOUS
  Filled 2014-01-23: qty 10

## 2014-01-23 MED ORDER — LEVOFLOXACIN 500 MG PO TABS: 500.0000 mg | ORAL_TABLET | Freq: Once | ORAL | Status: DC

## 2014-01-23 MED ORDER — ASPIRIN 81 MG PO CHEW
81.0000 mg | CHEWABLE_TABLET | Freq: Every day | ORAL | Status: DC
  Administered 2014-01-24 – 2014-01-25 (×2): 81 mg via ORAL
  Filled 2014-01-23 (×2): qty 1

## 2014-01-23 MED ORDER — PAROXETINE HCL 10 MG PO TABS
10.0000 mg | ORAL_TABLET | Freq: Every day | ORAL | Status: DC
  Administered 2014-01-23 – 2014-01-25 (×3): 10 mg via ORAL
  Filled 2014-01-23 (×4): qty 1

## 2014-01-23 MED ORDER — FESOTERODINE FUMARATE ER 4 MG PO TB24
4.0000 mg | ORAL_TABLET | Freq: Every day | ORAL | Status: DC
  Administered 2014-01-23 – 2014-01-25 (×3): 4 mg via ORAL
  Filled 2014-01-23 (×4): qty 1

## 2014-01-23 MED ORDER — ALBUTEROL SULFATE (2.5 MG/3ML) 0.083% IN NEBU: 2.5000 mg | INHALATION_SOLUTION | RESPIRATORY_TRACT | Status: DC | PRN

## 2014-01-23 MED ORDER — DEXTROSE 5 % IV SOLN
500.0000 mg | Freq: Once | INTRAVENOUS | Status: AC
  Administered 2014-01-23: 500 mg via INTRAVENOUS

## 2014-01-23 MED ORDER — INSULIN GLARGINE 100 UNIT/ML ~~LOC~~ SOLN
12.0000 [IU] | Freq: Every day | SUBCUTANEOUS | Status: DC
  Administered 2014-01-24 – 2014-01-25 (×2): 12 [IU] via SUBCUTANEOUS
  Filled 2014-01-23 (×2): qty 0.12

## 2014-01-23 MED ORDER — MOMETASONE FURO-FORMOTEROL FUM 200-5 MCG/ACT IN AERO
2.0000 | INHALATION_SPRAY | Freq: Two times a day (BID) | RESPIRATORY_TRACT | Status: DC
  Administered 2014-01-23 – 2014-01-25 (×4): 2 via RESPIRATORY_TRACT
  Filled 2014-01-23: qty 8.8

## 2014-01-23 MED ORDER — PREDNISONE 20 MG PO TABS
40.0000 mg | ORAL_TABLET | Freq: Every day | ORAL | Status: DC
  Administered 2014-01-24 – 2014-01-25 (×2): 40 mg via ORAL
  Filled 2014-01-23 (×3): qty 2

## 2014-01-23 NOTE — Progress Notes (Signed)
Patient received from ED via stretcher alert and oriented with daughter present.  IV in right forearm, bruising noticed on both arms patient states she bruises easily but has fallen recently since she has been sick not making it to the bathroom.  Informed her that we would need to assist her to bedside commode and to call for assistance.  Oriented to room, ordering meals, call light and orders from the doctor in regards to her care.  Patient and daughter verbalized understanding.

## 2014-01-23 NOTE — ED Notes (Signed)
Bed: WA14 Expected date:  Expected time:  Means of arrival:  Comments: EMS-SOB 

## 2014-01-23 NOTE — ED Notes (Signed)
Per EMS pt comes from home where she lives alone and her  Nieces come by home to check on her. Per family members pt been more lethargic over past couple of days and has cough. Lung sounds are congested per EMS. Pt does have dementia.

## 2014-01-23 NOTE — ED Notes (Signed)
Pt reports non productive cough for 2-3 days. Pt denies SOB at present time.

## 2014-01-23 NOTE — H&P (Signed)
[facility] History and Physical  KEEANNA VILLAFRANCA HAL:[id number] DOB: Dec 16, 1940 DOA: 01/23/2014  Referring physician: EDP PCP: Vidal Schwalbe, MD   Chief Complaint: sob and cough  HPI: [name] is a 73 y.o. female with prior h/o CAD, CHF, hypertension, diabetes mellitus, was brought in by the daughters for worsening sob, associated with cough, non productive, low grade temp and nausea. On arrival to ED, she was found to be hypoxic, in acute respiratory failure, wheezing. Her CXR revealed pneumonia. She was referred to the hospitalist service for [place] for copd exacerbation and pneumonia.     Review of Systems:  Constitutional:  No weight loss, night sweat, chills, fatigue. Positive for fevers. HEENT:  No headaches, Difficulty swallowing,Tooth/dental problems,Sore throat,  No sneezing, itching, ear ache, nasal congestion, post nasal drip,  Cardio-vascular:  No chest pain, Orthopnea, PND, swelling in lower extremities, anasarca, dizziness, palpitations  GI:  No heartburn, indigestion, abdominal pain, nausea, vomiting, diarrhea, change in bowel habits, loss of appetite  Resp:  Positive for sob, associated with cough, non productive.  Skin:  no rash or lesions.  GU:  no dysuria, change in color of urine, no urgency or frequency. No flank pain.  Musculoskeletal:  Myalgias.  Psych:  No change in mood or affect. No depression or anxiety. No memory loss.   Past Medical History  Diagnosis Date  . CAD (coronary artery disease)     a. s/p inferior STEMI 12/29/10 with rotablator atherectomy RCA 12/31/10 and DES x 2 RCA;  b. Lexiscan Myoview (11/2013):  Low risk study, no ischemia, inferolateral defect likely consistent with attenuation versus scar.  . Ischemic cardiomyopathy     EF 45%cath 2012 EF55% 5/12; 35% 11/14  . Diabetes mellitus     type 2  . HTN (hypertension)   . Hyperlipidemia   . Diverticulosis   . Respiratory arrest//ACE Inhibitor presumed cause   .  COPD (chronic obstructive pulmonary disease)   . Small bowel obstruction     from a sigmoid stricture  . Nephrolithiasis   . Sleep apnea   . Overweight   . Osteoarthritis   . GERD (gastroesophageal reflux disease)   . Anxiety   . Glaucoma   . Memory deficit   . Dementia    Past Surgical History  Procedure Laterality Date  . Bilateral tubal ligation    . Lithotripsy    . 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 Cardiac surgery     Social History:  reports that she quit smoking about 3 months ago. Her smoking use included Cigarettes. She has a 12.5 pack-year smoking history. She has never used smokeless tobacco. She reports that she drinks about 0.5 ounces of alcohol per week. She reports that she does not use illicit drugs.  Allergies  Allergen Reactions  . Lisinopril     REACTION: respiratory arrest jan 2009  . Eggs Or Egg-Derived Products Diarrhea  . Erythromycin Other (See Comments)    Makes me feel weird   . Iodine     Pt may be allergic to iodine, does not remember the details, refuses contrast-CS  . Penicillins Itching and Other (See Comments)    Too much as a kid  . Pentazocine Lactate Other (See Comments)    Reaction unknown  . Propoxyphene Hcl Other (See Comments)    Reaction unknown  . Quinapril Hcl Itching and Other (See Comments)    Too much   . Spironolactone     PATIENT  HAD SEVERE SIDE EFFECTS  . Sulfonamide Derivatives Other (See Comments)    Childhood allergy    Family History  Problem Relation Age of Onset  . Breast cancer Maternal Grandmother   . Diabetes Father   . Heart disease Father   . Diabetes Sister   . Colon cancer Neg Hx      Prior to Admission medications   Medication Sig Start Date End Date Taking? Authorizing Provider  albuterol (PROVENTIL HFA;VENTOLIN HFA) 108 (90 BASE) MCG/ACT inhaler Inhale 2 puffs into the lungs every 6 (six) hours as needed for wheezing.   Yes Historical Provider, MD  albuterol  (PROVENTIL) (2.5 MG/3ML) 0.083% nebulizer solution Take 2.5 mg by nebulization every 6 (six) hours as needed for wheezing or shortness of breath.    Yes Historical Provider, MD  aspirin 81 MG tablet Take 1 tablet (81 mg total) by mouth every morning. 10/14/13  Yes Donita Brooks, NP  clopidogrel (PLAVIX) 75 MG tablet Take 75 mg by mouth every morning.   Yes Historical Provider, MD  dextromethorphan-guaiFENesin (MUCINEX DM) 30-600 MG per 12 hr tablet Take 1 tablet by mouth 2 (two) times daily as needed for cough.   Yes Historical Provider, MD  HYDROcodone-homatropine (HYCODAN) 5-1.5 MG/5ML syrup Take 5 mLs by mouth every 6 (six) hours as needed for cough.   Yes Historical Provider, MD  insulin aspart (NOVOLOG) 100 unit/ml SOLN Inject 5 Units into the skin 3 (three) times daily with meals.  05/22/13  Yes Zerita Boers, MD  insulin glargine (LANTUS) 100 UNIT/ML injection Inject 0.12 mLs (12 Units total) into the skin daily. 10/14/13  Yes Donita Brooks, NP  ipratropium (ATROVENT) 0.02 % nebulizer solution Take 0.5 mg by nebulization 4 (four) times daily. Four times daily 10/30/13  Yes [name] S Parrett, NP  isosorbide mononitrate (IMDUR) 30 MG 24 hr tablet Take 1 tablet (30 mg total) by mouth daily. 10/14/13  Yes Donita Brooks, NP  JENTADUETO 2.03-999 MG TABS Take 1 tablet by mouth 2 (two) times daily.  08/16/11  Yes Historical Provider, MD  levofloxacin (LEVAQUIN) 500 MG tablet Take 500 mg by mouth daily. For 10 days, started on 01/19/14   Yes Historical Provider, MD  metoprolol succinate (TOPROL-XL) 25 MG 24 hr tablet Take 1 tablet (25 mg total) by mouth daily. 10/14/13  Yes Donita Brooks, NP  mometasone-formoterol (DULERA) 200-5 MCG/ACT AERO Inhale 2 puffs into the lungs 2 (two) times daily.   Yes Historical Provider, MD  oxyCODONE-acetaminophen (PERCOCET/ROXICET) 5-325 MG per tablet Take 1 tablet by mouth every 4 (four) hours as needed for severe pain (TAKE ONE TABLET TWICE AS NEEDED).   Yes  Historical Provider, MD  PARoxetine (PAXIL) 10 MG tablet Take 10 mg by mouth daily.   Yes Historical Provider, MD  pravastatin (PRAVACHOL) 40 MG tablet Take 40 mg by mouth daily.   Yes Historical Provider, MD  pregabalin (LYRICA) 75 MG capsule Take 75 mg by mouth 2 (two) times daily.     Yes Historical Provider, MD  rivastigmine (EXELON) 9.5 mg/24hr Place 1 patch onto the skin every morning.    Yes Historical Provider, MD  tolterodine (DETROL LA) 4 MG 24 hr capsule Take 4 mg by mouth every morning.    Yes Historical Provider, MD  furosemide (LASIX) 20 MG tablet Take 1 tablet (20 mg total) by mouth as needed. 10/28/13   Liliane Shi, PA-C  ibuprofen (ADVIL,MOTRIN) 200 MG tablet Take 400 mg by mouth every  6 (six) hours as needed.    Historical Provider, MD  LORazepam (ATIVAN) 0.5 MG tablet Take 0.5 mg by mouth 2 (two) times daily as needed for anxiety.    Historical Provider, MD  nitroGLYCERIN (NITROSTAT) 0.4 MG SL tablet Place 1 tablet (0.4 mg total) under the tongue every 5 (five) minutes as needed. For chest pain. 06/18/13   Burnell Blanks, MD  senna-docusate (SENOKOT-S) 8.6-50 MG per tablet Take 2 tablets by mouth at bedtime. Hold for diarrhea 10/14/13   Donita Brooks, NP   Physical Exam: Filed Vitals:   01/23/14 1811  BP: 144/76  Pulse: 90  Temp: 98.1 F (36.7 C)  Resp: 20    BP 144/76  Pulse 90  Temp(Src) 98.1 F (36.7 C) (Oral)  Resp 20  SpO2 96%  General:  Appears calm and comfortable Eyes: PERRL, normal lids, irises & conjunctiva ENT: grossly normal hearing, lips & tongue Neck: no LAD, masses or thyromegaly Cardiovascular: RRR, no m/r/g. No LE edema. Respiratory: scattered wheezing and rhonchi heard. . Abdomen: soft, ntnd Skin: no rash or induration seen on limited exam Musculoskeletal: grossly normal tone BUE/BLE Neurologic: grossly non-focal.          Labs on Admission:  Basic Metabolic Panel:  Recent Labs Lab 01/23/14 1424  NA 136*  K 5.4*  CL 97    CO2 26  GLUCOSE 96  BUN 17  CREATININE 0.67  CALCIUM 10.2   Liver Function Tests: No results found for this basename: AST, ALT, ALKPHOS, BILITOT, PROT, ALBUMIN,  in the last 168 hours No results found for this basename: LIPASE, AMYLASE,  in the last 168 hours No results found for this basename: AMMONIA,  in the last 168 hours CBC:  Recent Labs Lab 01/23/14 1424  WBC 9.5  NEUTROABS 6.5  HGB 14.2  HCT 42.6  MCV 96.6  PLT 205   Cardiac Enzymes: No results found for this basename: CKTOTAL, CKMB, CKMBINDEX, TROPONINI,  in the last 168 hours  BNP (last 3 results)  Recent Labs  10/12/13 0436 10/28/13 1248 01/23/14 1424  PROBNP 632.7* 41.0 279.5*   CBG:  Recent Labs Lab 01/23/14 1805  GLUCAP 130*    Radiological Exams on Admission: Dg Chest 2 View  01/23/2014   CLINICAL DATA:  Cough, congestion and fatigue.  EXAM: CHEST  2 VIEW  COMPARISON:  CT chest 11/05/2013 and PA and lateral chest 10/20/2013.  FINDINGS: There is a small left effusion and left lower lobe airspace disease. Patchy airspace disease is also seen in the right lung base. Heart size is upper normal. No pneumothorax.  IMPRESSION: Small left effusion and basilar airspace disease. There is also some basilar airspace disease on the right. Findings could be due to atelectasis or pneumonia.   Electronically Signed   By: Inge Rise M.D.   On: 01/23/2014 15:01    EKG:   Assessment/Plan Active Problems:   FIBROMYALGIA   DM2 (diabetes mellitus, type 2)   Essential hypertension, benign   COPD exacerbation   Coronary atherosclerosis of native coronary artery   Acute respiratory failure   CAP (community acquired pneumonia)   ACute respiratory failure with hypoxia: possibly secondary to combination of CAP and copd exacerbation.  - admitted her to telemetry, started her on po prednisone and IV antibiotics.  - blood cultures and sputum cultures ordered.  - urine for strepto and legionella antigen ordered.   - nasal oxygen as needed to keep sats>95% - bronchodilators as needed   2.  CAD:  - denies chest pain, resume aspirin and plavix.    3. Diabetes Mellitus: - hgba1c ordered.  - resume insulin.  - SSI.   4. Fibromyalgia: resume home medications.   5. Hyperkalemia: - repeat level tonight.   6. Fall at home, with hip pain: will get imaging of the hip.  - also get CK levels .    7. Chronic systolic heart failure: Appears compensated and euvolemic.  - lasix on hold.   6. DVT prophylaxis.   Code Status: full code Family Communication: discussed with the duaghters at bedside.  Disposition Plan: pending PT eval.  Time spent: 65 min  [facility] Pager [phone]

## 2014-01-23 NOTE — ED Provider Notes (Signed)
CSN: [id number]     Arrival date & time 01/23/14  1315 History   First MD Initiated Contact with Patient 01/23/14 1335     Chief Complaint  Patient presents with  . Cough  . Fatigue     (Consider location/radiation/quality/duration/timing/severity/associated sxs/prior Treatment) HPI  This is a 73 year old female with a history of coronary artery disease, diabetes, hypertension, COPD who presents with shortness of breath and cough. Patient reports to 3 week history of worsening shortness of breath and nonproductive cough. She denies any chest pain. She reports chills without fever.  She states that she has an inhaler at home and that it helps "some." She's not currently on home oxygen. She is noted to be 87% on room air.  Past Medical History  Diagnosis Date  . CAD (coronary artery disease)     a. s/p inferior STEMI 12/29/10 with rotablator atherectomy RCA 12/31/10 and DES x 2 RCA;  b. Lexiscan Myoview (11/2013):  Low risk study, no ischemia, inferolateral defect likely consistent with attenuation versus scar.  . Ischemic cardiomyopathy     EF 45%cath 2012 EF55% 5/12; 35% 11/14  . Diabetes mellitus     type 2  . HTN (hypertension)   . Hyperlipidemia   . Diverticulosis   . Respiratory arrest//ACE Inhibitor presumed cause   . COPD (chronic obstructive pulmonary disease)   . Small bowel obstruction     from a sigmoid stricture  . Nephrolithiasis   . Sleep apnea   . Overweight   . Osteoarthritis   . GERD (gastroesophageal reflux disease)   . Anxiety   . Glaucoma   . Memory deficit   . Dementia    Past Surgical History  Procedure Laterality Date  . Bilateral tubal ligation    . Lithotripsy    . 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 Cardiac surgery     Family History  Problem Relation Age of Onset  . Breast cancer Maternal Grandmother   . Diabetes Father   . Heart disease Father   . Diabetes Sister   . Colon cancer Neg Hx    History   Substance Use Topics  . Smoking status: Former Smoker -- 0.50 packs/day for 25 years    Types: Cigarettes    Quit date: 10/07/2013  . Smokeless tobacco: Never Used     Comment: over a ppd for 40+ years; quit 10/2004.  unsuccessfully tried eCigs.  . Alcohol Use: 0.5 oz/week    1 drink(s) per week     Comment: rare    OB History   Grav Para Term Preterm Abortions TAB SAB Ect Mult Living                 Review of Systems  Constitutional: Negative for fever.  Respiratory: Positive for cough, shortness of breath and wheezing. Negative for chest tightness.   Cardiovascular: Negative for chest pain and leg swelling.  Gastrointestinal: Negative for nausea, vomiting and abdominal pain.  Genitourinary: Negative for dysuria.  Musculoskeletal: Negative for back pain.  Skin: Negative for wound.  Neurological: Negative for headaches.  Psychiatric/Behavioral: Positive for confusion.  All other systems reviewed and are negative.      Allergies  Lisinopril; Eggs or egg-derived products; Erythromycin; Iodine; Penicillins; Pentazocine lactate; Propoxyphene hcl; Quinapril hcl; Spironolactone; and Sulfonamide derivatives  Home Medications   Current Outpatient Rx  Name  Route  Sig  Dispense  Refill  . albuterol (PROVENTIL HFA;VENTOLIN HFA) 108 (90 BASE) MCG/ACT inhaler  Inhalation   Inhale 2 puffs into the lungs every 6 (six) hours as needed for wheezing.         Marland Kitchen albuterol (PROVENTIL) (2.5 MG/3ML) 0.083% nebulizer solution   Nebulization   Take 2.5 mg by nebulization every 6 (six) hours as needed for wheezing or shortness of breath.          Marland Kitchen aspirin 81 MG tablet   Oral   Take 1 tablet (81 mg total) by mouth every morning.         . clopidogrel (PLAVIX) 75 MG tablet   Oral   Take 75 mg by mouth every morning.         Marland Kitchen dextromethorphan-guaiFENesin (MUCINEX DM) 30-600 MG per 12 hr tablet   Oral   Take 1 tablet by mouth 2 (two) times daily as needed for cough.          Marland Kitchen HYDROcodone-homatropine (HYCODAN) 5-1.5 MG/5ML syrup   Oral   Take 5 mLs by mouth every 6 (six) hours as needed for cough.         . insulin aspart (NOVOLOG) 100 unit/ml SOLN   Subcutaneous   Inject 5 Units into the skin 3 (three) times daily with meals.          . insulin glargine (LANTUS) 100 UNIT/ML injection   Subcutaneous   Inject 0.12 mLs (12 Units total) into the skin daily.   10 mL   12   . ipratropium (ATROVENT) 0.02 % nebulizer solution   Nebulization   Take 0.5 mg by nebulization 4 (four) times daily. Four times daily         . isosorbide mononitrate (IMDUR) 30 MG 24 hr tablet   Oral   Take 1 tablet (30 mg total) by mouth daily.   30 tablet   3   . JENTADUETO 2.03-999 MG TABS   Oral   Take 1 tablet by mouth 2 (two) times daily.          Marland Kitchen levofloxacin (LEVAQUIN) 500 MG tablet   Oral   Take 500 mg by mouth daily. For 10 days, started on 01/19/14         . metoprolol succinate (TOPROL-XL) 25 MG 24 hr tablet   Oral   Take 1 tablet (25 mg total) by mouth daily.   30 tablet   3   . mometasone-formoterol (DULERA) 200-5 MCG/ACT AERO   Inhalation   Inhale 2 puffs into the lungs 2 (two) times daily.         Marland Kitchen oxyCODONE-acetaminophen (PERCOCET/ROXICET) 5-325 MG per tablet   Oral   Take 1 tablet by mouth every 4 (four) hours as needed for severe pain (TAKE ONE TABLET TWICE AS NEEDED).         Marland Kitchen PARoxetine (PAXIL) 10 MG tablet   Oral   Take 10 mg by mouth daily.         . pravastatin (PRAVACHOL) 40 MG tablet   Oral   Take 40 mg by mouth daily.         . pregabalin (LYRICA) 75 MG capsule   Oral   Take 75 mg by mouth 2 (two) times daily.           . rivastigmine (EXELON) 9.5 mg/24hr   Transdermal   Place 1 patch onto the skin every morning.          . tolterodine (DETROL LA) 4 MG 24 hr capsule   Oral   Take 4 mg by  mouth every morning.          . furosemide (LASIX) 20 MG tablet   Oral   Take 1 tablet (20 mg total) by mouth as  needed.   30 tablet   1   . ibuprofen (ADVIL,MOTRIN) 200 MG tablet   Oral   Take 400 mg by mouth every 6 (six) hours as needed.         Marland Kitchen LORazepam (ATIVAN) 0.5 MG tablet   Oral   Take 0.5 mg by mouth 2 (two) times daily as needed for anxiety.         . nitroGLYCERIN (NITROSTAT) 0.4 MG SL tablet   Sublingual   Place 1 tablet (0.4 mg total) under the tongue every 5 (five) minutes as needed. For chest pain.   25 tablet   6   . senna-docusate (SENOKOT-S) 8.6-50 MG per tablet   Oral   Take 2 tablets by mouth at bedtime. Hold for diarrhea   60 tablet   1    BP 120/55  Pulse 88  Temp(Src) 98.1 F (36.7 C) (Oral)  Resp 21  SpO2 95% Physical Exam  Nursing note and vitals reviewed. Constitutional: She is oriented to person, place, and time. No distress.  Chronically ill-appearing, no respiratory distress  HENT:  Head: Normocephalic and atraumatic.  Mucous membranes dry  Eyes: Pupils are equal, round, and reactive to light.  Neck: Neck supple. No JVD present.  Cardiovascular: Normal rate, regular rhythm and normal heart sounds.   Pulmonary/Chest: Effort normal. No respiratory distress. She has wheezes.  Nasal cannula in place  Abdominal: Soft. Bowel sounds are normal. There is no tenderness.  Musculoskeletal: She exhibits no edema.  Neurological: She is alert and oriented to person, place, and time.  Skin: Skin is warm and dry.  Psychiatric: She has a normal mood and affect.    ED Course  Procedures (including critical care time) Labs Review Labs Reviewed  BASIC METABOLIC PANEL - Abnormal; Notable for the following:    Sodium 136 (*)    Potassium 5.4 (*)    GFR calc non Af Amer 86 (*)    All other components within normal limits  PRO B NATRIURETIC PEPTIDE - Abnormal; Notable for the following:    Pro B Natriuretic peptide (BNP) 279.5 (*)    All other components within normal limits  CULTURE, BLOOD (ROUTINE X 2)  CULTURE, BLOOD (ROUTINE X 2)  CBC WITH  DIFFERENTIAL   Imaging Review Dg Chest 2 View  01/23/2014   CLINICAL DATA:  Cough, congestion and fatigue.  EXAM: CHEST  2 VIEW  COMPARISON:  CT chest 11/05/2013 and PA and lateral chest 10/20/2013.  FINDINGS: There is a small left effusion and left lower lobe airspace disease. Patchy airspace disease is also seen in the right lung base. Heart size is upper normal. No pneumothorax.  IMPRESSION: Small left effusion and basilar airspace disease. There is also some basilar airspace disease on the right. Findings could be due to atelectasis or pneumonia.   Electronically Signed   By: Inge Rise M.D.   On: 01/23/2014 15:01     EKG Interpretation None      MDM   Final diagnoses:  Community acquired pneumonia  COPD (chronic obstructive pulmonary disease)    Patient presents with cough and shortness of breath. Requiring supplemental O2. Chest x-ray shows evidence of possible pneumonia. No risk factors for hospital-acquired pneumonia. Patient was given Rocephin and azithromycin. She is given a duo neb and  steroids for COPD exacerbation. Given her oxygen requirement, she will be admitted for further management.    Merryl Hacker, MD 01/23/14 [phone]

## 2014-01-24 LAB — GLUCOSE, CAPILLARY
GLUCOSE-CAPILLARY: 203 mg/dL — AB (ref 70–99)
Glucose-Capillary: 241 mg/dL — ABNORMAL HIGH (ref 70–99)
Glucose-Capillary: 222 mg/dL — ABNORMAL HIGH (ref 70–99)
Glucose-Capillary: 294 mg/dL — ABNORMAL HIGH (ref 70–99)
Glucose-Capillary: 189 mg/dL — ABNORMAL HIGH (ref 70–99)

## 2014-01-24 LAB — BASIC METABOLIC PANEL
BUN: 16 mg/dL (ref 6–23)
CALCIUM: 9.6 mg/dL (ref 8.4–10.5)
CO2: 27 mEq/L (ref 19–32)
CREATININE: 0.59 mg/dL (ref 0.50–1.10)
Chloride: 98 mEq/L (ref 96–112)
GFR, EST NON AFRICAN AMERICAN: 89 mL/min — AB (ref >90)
Glucose, Bld: 241 mg/dL — ABNORMAL HIGH (ref 70–99)
Potassium: 4.2 mEq/L (ref 3.7–5.3)
Sodium: 137 mEq/L (ref 137–147)

## 2014-01-24 LAB — URINALYSIS, ROUTINE W REFLEX MICROSCOPIC
Bilirubin Urine: NEGATIVE
HGB URINE DIPSTICK: NEGATIVE
KETONES UR: NEGATIVE mg/dL
Leukocytes, UA: NEGATIVE
Nitrite: NEGATIVE
PROTEIN: NEGATIVE mg/dL
Specific Gravity, Urine: 1.017 (ref 1.005–1.030)
UROBILINOGEN UA: 0.2 mg/dL (ref 0.0–1.0)
pH: 5.5 (ref 5.0–8.0)

## 2014-01-24 LAB — CBC
HEMATOCRIT: 39 % (ref 36.0–46.0)
Hemoglobin: 12.9 g/dL (ref 12.0–15.0)
MCH: 31.2 pg (ref 26.0–34.0)
MCHC: 33.1 g/dL (ref 30.0–36.0)
MCV: 94.4 fL (ref 78.0–100.0)
PLATELETS: 209 10*3/uL (ref 150–400)
RBC: 4.13 MIL/uL (ref 3.87–5.11)
RDW: 13.3 % (ref 11.5–15.5)
WBC: 6.7 10*3/uL (ref 4.0–10.5)

## 2014-01-24 LAB — LEGIONELLA ANTIGEN, URINE: Legionella Antigen, Urine: NEGATIVE

## 2014-01-24 LAB — URINE MICROSCOPIC-ADD ON

## 2014-01-24 LAB — STREP PNEUMONIAE URINARY ANTIGEN: STREP PNEUMO URINARY ANTIGEN: NEGATIVE

## 2014-01-24 LAB — HIV ANTIBODY (ROUTINE TESTING W REFLEX): HIV: NONREACTIVE

## 2014-01-24 NOTE — Evaluation (Signed)
Occupational Therapy Evaluation Patient Details Name: [name] MRN: [id number] DOB: Mar 29, 1941 Today's Date: 01/24/2014 Time: 3235-5732 OT Time Calculation (min): 25 min  OT Assessment / Plan / Recommendation History of present illness Pt was admitted with CAP/COPD exacerbation.  She has a h/o CAD, CHF, HTN, DM, Fibromyalgia and dementia   Clinical Impression   Pt was admitted for the above.  She fatiqued during OT eval but sats remained above 92% on 4 liters 02. She will benefit from skilled OT to increase activity tolerance.  Goals in acute are for overall supervision level.      OT Assessment  Patient needs continued OT Services    Follow Up Recommendations  Supervision/Assistance - 24 hour;Home health OT (vs SNF)    Barriers to Discharge      Equipment Recommendations  None recommended by OT (likely)    Recommendations for Other Services    Frequency  Min 2X/week    Precautions / Restrictions Precautions Precautions: Fall Restrictions Weight Bearing Restrictions: No   Pertinent Vitals/Pain Pain in R arm reported.  Noted bruise on L side of upper back.  Sats 94-98% on 4 liters 02.  Dyspnea 2/4.  Cued for pursed lip breathing    ADL  Grooming: Teeth care;Supervision/safety Where Assessed - Grooming: Supported sitting;Supported standing Lower Body Dressing: Min guard (for balance) Where Assessed - Lower Body Dressing: Unsupported sit to stand Toilet Transfer: Minimal assistance Toilet Transfer Method: Sit to stand Toilet Transfer Equipment: Bedside commode Toileting - Clothing Manipulation and Hygiene: Minimal guard Where Assessed - Toileting Clothing Manipulation and Hygiene: Sit to stand from 3-in-1 or toilet Transfers/Ambulation Related to ADLs: ambulated to sink with min A for safety/balance ADL Comments: Pt fatiqued easily.  Pt distracted when brushing teeth at sink:  couldn't find toothpaste, which was in her hand.  Completed task at eob after she used 3:1  commode.      OT Diagnosis: Generalized weakness  OT Problem List: Decreased strength;Decreased activity tolerance;Decreased cognition;Decreased safety awareness;Decreased knowledge of use of DME or AE;Pain;Cardiopulmonary status limiting activity OT Treatment Interventions: Self-care/ADL training;Patient/family education;Therapeutic activities;DME and/or AE instruction;Cognitive remediation/compensation   OT Goals(Current goals can be found in the care plan section) Acute Rehab OT Goals Patient Stated Goal: none stated; agreeable to OT OT Goal Formulation: With patient Time For Goal Achievement: 02/07/14 Potential to Achieve Goals: Good ADL Goals Pt Will Transfer to Toilet: with supervision;ambulating;regular height toilet Pt Will Perform Toileting - Clothing Manipulation and hygiene: with supervision;sit to/from stand Additional ADL Goal #1: pt will sustain attention to adl task for 5 minutes without cues Additional ADL Goal #2: pt will tolerate 20 minutes activity with 3 rest breaks   Visit Information  Last OT Received On: 01/24/14 Assistance Needed: +1 History of Present Illness: Pt was admitted with CAP/COPD exacerbation.  She has a h/o CAD, CHF, HTN, DM, Fibromyalgia and dementia       Prior [place] expects to be discharged to:: Private residence Additional Comments: pt with h/o dementia.  Will check with family Prior Function Comments: per pt, independent:  will need to verify Communication Communication: No difficulties         Vision/Perception     Cognition  Cognition Arousal/Alertness: Awake/alert Behavior During Therapy: WFL for tasks assessed/performed Overall Cognitive Status: No family/caregiver present to determine baseline cognitive functioning    Extremity/Trunk Assessment Upper Extremity Assessment Upper Extremity Assessment: RUE deficits/detail RUE Deficits / Details: c/o R arm pain.  Able  to reach to head  without increased pain.  Did not MMT     Mobility Bed Mobility Overal bed mobility: Modified Independent General bed mobility comments: used bed rail Transfers Overall transfer level: Needs assistance Equipment used: None Transfers: Sit to/from Stand Sit to Stand: Min guard General transfer comment: for safety     Exercise     Balance     End of Session OT - End of Session Activity Tolerance: Patient limited by fatigue Patient left: in bed;with call bell/phone within reach;with bed alarm set  [place] 01/24/2014, 2:45 PM Lesle Chris, OTR/L [phone] 01/24/2014

## 2014-01-24 NOTE — Progress Notes (Signed)
[facility] PROGRESS NOTE  [name] [id number] DOB: 07-25-1941 DOA: 01/23/2014 PCP: Vidal Schwalbe, MD  Assessment/Plan:  ACute respiratory failure with hypoxia: possibly secondary to combination of CAP and copd exacerbation.  - admitted her to telemetry, started her on po prednisone and IV antibiotics.  - blood cultures and sputum cultures ordered.  - urine for strepto and legionella antigen ordered.  - nasal oxygen as needed to keep sats>95%  - bronchodilators as needed  2. CAD:  - denies chest pain, resume aspirin and plavix.  3. Diabetes Mellitus:  - hgba1c ordered.  - resume insulin.  - SSI.  4. Fibromyalgia: resume home medications.  5. Hyperkalemia:  - repeat level tonight.  6. Fall at home, with hip pain: will get imaging of the hip.  - also get CK levels .  7. Chronic systolic heart failure:  Appears compensated and euvolemic.  - lasix on hold.  6. DVT prophylaxis.  Code Status: full code  Family Communication: discussed with the duaghters at bedside.  Disposition Plan: pending PT eval.    Consultants:  none  Procedures: none  Antibiotics:  Rocephin   zithromax  HPI/Subjective: Feel better.   Objective: Filed Vitals:   01/24/14 1400  BP: 160/71  Pulse: 89  Temp: 97.7 F (36.5 C)  Resp: 16   No intake or output data in the 24 hours ending 01/24/14 1744 There were no vitals filed for this visit.  Exam:   General:  Alert afebrile comfortable  Cardiovascular: s1s2  Respiratory: ctab  Abdomen: soft NT NDBS+  Musculoskeletal: no pedal edema.  Data Reviewed: Basic Metabolic Panel:  Recent Labs Lab 01/23/14 1424 01/23/14 2052 01/24/14 0513  NA 136*  --  137  K 5.4* 4.6 4.2  CL 97  --  98  CO2 26  --  27  GLUCOSE 96  --  241*  BUN 17  --  16  CREATININE 0.67  --  0.59  CALCIUM 10.2  --  9.6   Liver Function Tests: No results found for this basename: AST, ALT, ALKPHOS, BILITOT, PROT, ALBUMIN,  in the last 168  hours No results found for this basename: LIPASE, AMYLASE,  in the last 168 hours No results found for this basename: AMMONIA,  in the last 168 hours CBC:  Recent Labs Lab 01/23/14 1424 01/24/14 0513  WBC 9.5 6.7  NEUTROABS 6.5  --   HGB 14.2 12.9  HCT 42.6 39.0  MCV 96.6 94.4  PLT 205 209   Cardiac Enzymes: No results found for this basename: CKTOTAL, CKMB, CKMBINDEX, TROPONINI,  in the last 168 hours BNP (last 3 results)  Recent Labs  10/12/13 0436 10/28/13 1248 01/23/14 1424  PROBNP 632.7* 41.0 279.5*   CBG:  Recent Labs Lab 01/23/14 1805 01/23/14 2148 01/24/14 0749 01/24/14 1205 01/24/14 1639  GLUCAP 130* 241* 222* 203* 294*    Recent Results (from the past 240 hour(s))  CULTURE, BLOOD (ROUTINE X 2)     Status: None   Collection Time    01/23/14  3:52 PM      Result Value Ref Range Status   Specimen Description BLOOD LAC   Final   Special Requests BOTTLES DRAWN AEROBIC AND ANAEROBIC 7ML   Final   Culture  Setup Time     Final   Value: 01/23/2014 20:26     Performed at [facility]   Culture     Final   Value:        BLOOD  CULTURE RECEIVED NO GROWTH TO DATE CULTURE WILL BE HELD FOR 5 DAYS BEFORE ISSUING A FINAL NEGATIVE REPORT     Performed at [facility]   Report Status PENDING   Incomplete  CULTURE, BLOOD (ROUTINE X 2)     Status: None   Collection Time    01/23/14  4:00 PM      Result Value Ref Range Status   Specimen Description BLOOD LEFT FOREARM   Final   Special Requests BOTTLES DRAWN AEROBIC AND ANAEROBIC 2ML   Final   Culture  Setup Time     Final   Value: 01/23/2014 20:24     Performed at [facility]   Culture     Final   Value:        BLOOD CULTURE RECEIVED NO GROWTH TO DATE CULTURE WILL BE HELD FOR 5 DAYS BEFORE ISSUING A FINAL NEGATIVE REPORT     Performed at [facility]   Report Status PENDING   Incomplete     Studies: Dg Chest 2 View  01/23/2014   CLINICAL DATA:  Cough, congestion and fatigue.   EXAM: CHEST  2 VIEW  COMPARISON:  CT chest 11/05/2013 and PA and lateral chest 10/20/2013.  FINDINGS: There is a small left effusion and left lower lobe airspace disease. Patchy airspace disease is also seen in the right lung base. Heart size is upper normal. No pneumothorax.  IMPRESSION: Small left effusion and basilar airspace disease. There is also some basilar airspace disease on the right. Findings could be due to atelectasis or pneumonia.   Electronically Signed   By: Inge Rise M.D.   On: 01/23/2014 15:01   Dg Hip Bilateral W/pelvis  01/23/2014   CLINICAL DATA:  Fall with hip tenderness  EXAM: BILATERAL HIP WITH PELVIS - 4+ VIEW  COMPARISON:  Abdominal CT 07/29/2008  FINDINGS: No evidence of pelvic ring fracture or diastasis. The hips are located and show no evidence of fracture. There are osteophytes around both hips, without significant degenerative joint narrowing. No evidence of focal bone lesion. Small bowel clustered over the left abdomen is likely related to abdominal wall laxity (rather than hernia) given the appearance on comparison CT.  IMPRESSION: No acute osseous findings.   Electronically Signed   By: Jorje Guild M.D.   On: 01/23/2014 21:45    Scheduled Meds: . aspirin  81 mg Oral Daily  . azithromycin  500 mg Intravenous Q24H  . cefTRIAXone (ROCEPHIN)  IV  1 g Intravenous Q24H  . clopidogrel  75 mg Oral Q breakfast  . enoxaparin (LOVENOX) injection  40 mg Subcutaneous Q24H  . fesoterodine  4 mg Oral Daily  . insulin aspart  0-9 Units Subcutaneous TID WC  . insulin glargine  12 Units Subcutaneous Daily  . ipratropium  0.5 mg Nebulization QID  . metoprolol succinate  25 mg Oral Daily  . mometasone-formoterol  2 puff Inhalation BID  . PARoxetine  10 mg Oral Daily  . predniSONE  40 mg Oral QAC breakfast  . pregabalin  75 mg Oral BID  . rivastigmine  9.5 mg Transdermal q morning - 10a   Continuous Infusions:   Active Problems:   FIBROMYALGIA   DM2 (diabetes  mellitus, type 2)   Essential hypertension, benign   COPD exacerbation   Coronary atherosclerosis of native coronary artery   Acute respiratory failure   CAP (community acquired pneumonia)    Time spent: 25 min    [place]  [facility] Pager  [phone] If 7PM-7AM, please contact night-coverage at [email or web address], password [facility] 01/24/2014, 5:44 PM  LOS: 1 day

## 2014-01-24 NOTE — Progress Notes (Signed)
PT Cancellation Note  Patient Details Name: [name] MRN: [id number] DOB: 08-15-1941   Cancelled Treatment:    Reason Eval/Treat Not Completed: Other (comment) (at 1414 pt had just finished w/OT and was fatgued. eval in AM.)   [name] 01/24/2014, 5:06 PM

## 2014-01-25 DIAGNOSIS — J449 Chronic obstructive pulmonary disease, unspecified: Secondary | ICD-10-CM

## 2014-01-25 LAB — GLUCOSE, CAPILLARY
GLUCOSE-CAPILLARY: 184 mg/dL — AB (ref 70–99)
GLUCOSE-CAPILLARY: 304 mg/dL — AB (ref 70–99)
Glucose-Capillary: 291 mg/dL — ABNORMAL HIGH (ref 70–99)

## 2014-01-25 MED ORDER — PREDNISONE 20 MG PO TABS: ORAL_TABLET | ORAL | Status: DC

## 2014-01-25 MED ORDER — INSULIN GLARGINE 100 UNIT/ML ~~LOC~~ SOLN: 10.0000 [IU] | Freq: Every day | SUBCUTANEOUS | Status: DC

## 2014-01-25 NOTE — Progress Notes (Signed)
SATURATION QUALIFICATIONS: (This note is used to comply with regulatory documentation for home oxygen)  Patient Saturations on Room Air at Rest = 88  Patient Saturations on Room Air while Ambulating =   Patient Saturations on  Liters of oxygen while Ambulating =   Please briefly explain why patient needs home oxygen: O2 sat 88% RA at rest

## 2014-01-25 NOTE — Discharge Summary (Signed)
Physician Discharge Summary  [name] VOZ:[id number] DOB: 1941/05/02 DOA: 01/23/2014  PCP: Vidal Schwalbe, MD  Admit date: 01/23/2014 Discharge date: 01/25/2014  Time spent: 35 minutes  Recommendations for Outpatient Follow-up:  1. Follow upw ith PCP in one week 2. Follow up with CXR in on eweek.  3. Follow up with HOMEhealth PT and OT.   Discharge Diagnoses:  Active Problems:   FIBROMYALGIA   DM2 (diabetes mellitus, type 2)   Essential hypertension, benign   COPD exacerbation   Coronary atherosclerosis of native coronary artery   Acute respiratory failure   CAP (community acquired pneumonia)   Discharge Condition: improved.   Diet recommendation: low sodium diet  Filed Weights   01/25/14 0557  Weight: 64.638 kg (142 lb 8 oz)    History of present illness:  [name] is a 73 y.o. female with prior h/o CAD, CHF, hypertension, diabetes mellitus, was brought in by the daughters for worsening sob, associated with cough, non productive, low grade temp and nausea. On arrival to ED, she was found to be hypoxic, in acute respiratory failure, wheezing. Her CXR revealed pneumonia. She was referred to the hospitalist service for [place] for copd exacerbation and pneumonia.   Hospital Course:  ACute respiratory failure with hypoxia: possibly secondary to combination of CAP and copd exacerbation.  - admitted her to telemetry, started her on po prednisone and IV antibiotics.  - blood cultures NEGATIVE sof ar.  - urine for strepto and legionella antigen ordered and negative  - nasal oxygen as needed to keep sats>95%  - bronchodilators as needed on discharge. She will complete the course of antibiotics with levaquin and prednisone taper on discharge.  2. CAD:  - denies chest pain, resume aspirin and plavix.  3. Diabetes Mellitus:  - hgba1c is 6.   - resume insulin.  - stopped Jentadueto 4. Fibromyalgia: resume home medications.  5. Hyperkalemia:  - repeat level normal.   6. Fall at home, with hip pain: will get imaging of the hip whch were negative for acute fractures. .  7. Chronic systolic heart failure:  Appears compensated and euvolemic.   Procedures:  CXR  Consultations:  none  Discharge Exam: Filed Vitals:   01/25/14 1400  BP: 126/68  Pulse: 85  Temp: 98 F (36.7 C)  Resp: 18   General: Alert afebrile comfortable  Cardiovascular: s1s2  Respiratory: ctab  Abdomen: soft NT NDBS+  Musculoskeletal: no pedal edema.   Discharge Instructions  Discharge Orders   Future Appointments Provider Department Dept Phone   07/02/2014 11:00 AM Dennie Bible, NP [facility] [phone]   Future Orders Complete By Expires   Diet - low sodium heart healthy  As directed    Discharge instructions  As directed    Comments:     Follow up with PCP in one week.       Medication List    STOP taking these medications       ibuprofen 200 MG tablet  Commonly known as:  ADVIL,MOTRIN     insulin aspart 100 unit/ml Soln  Commonly known as:  novoLOG     JENTADUETO 2.03-999 MG Tabs  Generic drug:  Linagliptin-Metformin HCl      TAKE these medications       albuterol (2.5 MG/3ML) 0.083% nebulizer solution  Commonly known as:  PROVENTIL  Take 2.5 mg by nebulization every 6 (six) hours as needed for wheezing or shortness of breath.     albuterol 108 (90  BASE) MCG/ACT inhaler  Commonly known as:  PROVENTIL HFA;VENTOLIN HFA  Inhale 2 puffs into the lungs every 6 (six) hours as needed for wheezing.     aspirin 81 MG tablet  Take 1 tablet (81 mg total) by mouth every morning.     clopidogrel 75 MG tablet  Commonly known as:  PLAVIX  Take 75 mg by mouth every morning.     dextromethorphan-guaiFENesin 30-600 MG per 12 hr tablet  Commonly known as:  MUCINEX DM  Take 1 tablet by mouth 2 (two) times daily as needed for cough.     DULERA 200-5 MCG/ACT Aero  Generic drug:  mometasone-formoterol  Inhale 2 puffs into the  lungs 2 (two) times daily.     furosemide 20 MG tablet  Commonly known as:  LASIX  Take 1 tablet (20 mg total) by mouth as needed.     HYDROcodone-homatropine 5-1.5 MG/5ML syrup  Commonly known as:  HYCODAN  Take 5 mLs by mouth every 6 (six) hours as needed for cough.     insulin glargine 100 UNIT/ML injection  Commonly known as:  LANTUS  Inject 0.1 mLs (10 Units total) into the skin daily.     ipratropium 0.02 % nebulizer solution  Commonly known as:  ATROVENT  Take 0.5 mg by nebulization 4 (four) times daily. Four times daily     isosorbide mononitrate 30 MG 24 hr tablet  Commonly known as:  IMDUR  Take 1 tablet (30 mg total) by mouth daily.     levofloxacin 500 MG tablet  Commonly known as:  LEVAQUIN  Take 500 mg by mouth daily. For 10 days, started on 01/19/14     LORazepam 0.5 MG tablet  Commonly known as:  ATIVAN  Take 0.5 mg by mouth 2 (two) times daily as needed for anxiety.     metoprolol succinate 25 MG 24 hr tablet  Commonly known as:  TOPROL-XL  Take 1 tablet (25 mg total) by mouth daily.     nitroGLYCERIN 0.4 MG SL tablet  Commonly known as:  NITROSTAT  Place 1 tablet (0.4 mg total) under the tongue every 5 (five) minutes as needed. For chest pain.     oxyCODONE-acetaminophen 5-325 MG per tablet  Commonly known as:  PERCOCET/ROXICET  Take 1 tablet by mouth every 4 (four) hours as needed for severe pain (TAKE ONE TABLET TWICE AS NEEDED).     PARoxetine 10 MG tablet  Commonly known as:  PAXIL  Take 10 mg by mouth daily.     pravastatin 40 MG tablet  Commonly known as:  PRAVACHOL  Take 40 mg by mouth daily.     predniSONE 20 MG tablet  Commonly known as:  DELTASONE  - Prednisone 40 mg daily for one more day followed by   - Prednisone 20 mg daily for 3 days followed by   - Prednisone 10mg  daily for 3 days and stop.     pregabalin 75 MG capsule  Commonly known as:  LYRICA  Take 75 mg by mouth 2 (two) times daily.     rivastigmine 9.5 mg/24hr   Commonly known as:  EXELON  Place 1 patch onto the skin every morning.     senna-docusate 8.6-50 MG per tablet  Commonly known as:  Senokot-S  Take 2 tablets by mouth at bedtime. Hold for diarrhea     tolterodine 4 MG 24 hr capsule  Commonly known as:  DETROL LA  Take 4 mg by mouth every morning.  Allergies  Allergen Reactions  . Lisinopril     REACTION: respiratory arrest jan 2009  . Eggs Or Egg-Derived Products Diarrhea  . Erythromycin Other (See Comments)    Makes me feel weird   . Iodine     Pt may be allergic to iodine, does not remember the details, refuses contrast-CS  . Penicillins Itching and Other (See Comments)    Too much as a kid  . Pentazocine Lactate Other (See Comments)    Reaction unknown  . Propoxyphene Hcl Other (See Comments)    Reaction unknown  . Quinapril Hcl Itching and Other (See Comments)    Too much   . Spironolactone     PATIENT HAD SEVERE SIDE EFFECTS  . Sulfonamide Derivatives Other (See Comments)    Childhood allergy       Follow-up Information   Follow up with WHITE,CYNTHIA S, MD. Schedule an appointment as soon as possible for a visit in 1 week.   Specialty:  Family Medicine   Contact information:   [address], [place] [address] [phone]        The results of significant diagnostics from this hospitalization (including imaging, microbiology, ancillary and laboratory) are listed below for reference.    Significant Diagnostic Studies: Dg Chest 2 View  01/23/2014   CLINICAL DATA:  Cough, congestion and fatigue.  EXAM: CHEST  2 VIEW  COMPARISON:  CT chest 11/05/2013 and PA and lateral chest 10/20/2013.  FINDINGS: There is a small left effusion and left lower lobe airspace disease. Patchy airspace disease is also seen in the right lung base. Heart size is upper normal. No pneumothorax.  IMPRESSION: Small left effusion and basilar airspace disease. There is also some basilar airspace disease on the right.  Findings could be due to atelectasis or pneumonia.   Electronically Signed   By: Inge Rise M.D.   On: 01/23/2014 15:01   Dg Hip Bilateral W/pelvis  01/23/2014   CLINICAL DATA:  Fall with hip tenderness  EXAM: BILATERAL HIP WITH PELVIS - 4+ VIEW  COMPARISON:  Abdominal CT 07/29/2008  FINDINGS: No evidence of pelvic ring fracture or diastasis. The hips are located and show no evidence of fracture. There are osteophytes around both hips, without significant degenerative joint narrowing. No evidence of focal bone lesion. Small bowel clustered over the left abdomen is likely related to abdominal wall laxity (rather than hernia) given the appearance on comparison CT.  IMPRESSION: No acute osseous findings.   Electronically Signed   By: Jorje Guild M.D.   On: 01/23/2014 21:45    Microbiology: Recent Results (from the past 240 hour(s))  CULTURE, BLOOD (ROUTINE X 2)     Status: None   Collection Time    01/23/14  3:52 PM      Result Value Ref Range Status   Specimen Description BLOOD LAC   Final   Special Requests BOTTLES DRAWN AEROBIC AND ANAEROBIC 7ML   Final   Culture  Setup Time     Final   Value: 01/23/2014 20:26     Performed at [facility]   Culture     Final   Value:        BLOOD CULTURE RECEIVED NO GROWTH TO DATE CULTURE WILL BE HELD FOR 5 DAYS BEFORE ISSUING A FINAL NEGATIVE REPORT     Performed at [facility]   Report Status PENDING   Incomplete  CULTURE, BLOOD (ROUTINE X 2)     Status: None  Collection Time    01/23/14  4:00 PM      Result Value Ref Range Status   Specimen Description BLOOD LEFT FOREARM   Final   Special Requests BOTTLES DRAWN AEROBIC AND ANAEROBIC 2ML   Final   Culture  Setup Time     Final   Value: 01/23/2014 20:24     Performed at [facility]   Culture     Final   Value:        BLOOD CULTURE RECEIVED NO GROWTH TO DATE CULTURE WILL BE HELD FOR 5 DAYS BEFORE ISSUING A FINAL NEGATIVE REPORT     Performed at [facility]    Report Status PENDING   Incomplete     Labs: Basic Metabolic Panel:  Recent Labs Lab 01/23/14 1424 01/23/14 2052 01/24/14 0513  NA 136*  --  137  K 5.4* 4.6 4.2  CL 97  --  98  CO2 26  --  27  GLUCOSE 96  --  241*  BUN 17  --  16  CREATININE 0.67  --  0.59  CALCIUM 10.2  --  9.6   Liver Function Tests: No results found for this basename: AST, ALT, ALKPHOS, BILITOT, PROT, ALBUMIN,  in the last 168 hours No results found for this basename: LIPASE, AMYLASE,  in the last 168 hours No results found for this basename: AMMONIA,  in the last 168 hours CBC:  Recent Labs Lab 01/23/14 1424 01/24/14 0513  WBC 9.5 6.7  NEUTROABS 6.5  --   HGB 14.2 12.9  HCT 42.6 39.0  MCV 96.6 94.4  PLT 205 209   Cardiac Enzymes: No results found for this basename: CKTOTAL, CKMB, CKMBINDEX, TROPONINI,  in the last 168 hours BNP: BNP (last 3 results)  Recent Labs  10/12/13 0436 10/28/13 1248 01/23/14 1424  PROBNP 632.7* 41.0 279.5*   CBG:  Recent Labs Lab 01/24/14 1205 01/24/14 1639 01/24/14 2131 01/25/14 0732 01/25/14 1145  GLUCAP 203* 294* 189* 184* 291*       Signed:  Cruzito Standre  [facility] 01/25/2014, 2:56 PM

## 2014-01-25 NOTE — Evaluation (Signed)
Physical Therapy Evaluation Patient Details Name: [name] MRN: [id number] DOB: 07/19/1941 Today's Date: 01/25/2014 Time: 7824-2353 PT Time Calculation (min): 15 min  PT Assessment / Plan / Recommendation History of Present Illness  Pt was admitted with CAP/COPD exacerbation.  She has a h/o CAD, CHF, HTN, DM, Fibromyalgia and dementia  Clinical Impression  Pt ambulated in x 175 ' with RW. No family present to discuss 24/7 caregivers, pt could not  Verify if family can assist. Pt will benefit from PT to address problems listed below. Recommend HHPT .   PT Assessment  Patient needs continued PT services    Follow Up Recommendations  Home health PT;Supervision/Assistance - 24 hour    Does the patient have the potential to tolerate intense rehabilitation      Barriers to Discharge Decreased caregiver support      Equipment Recommendations  None recommended by PT    Recommendations for Other Services     Frequency Min 3X/week    Precautions / Restrictions Precautions Precautions: Fall   Pertinent Vitals/Pain sats dropped to 86% (? Realiable), bounced back to 94 % all on RA. Pt coughed frequently. Had to stop during walking to rest.      Mobility  Bed Mobility Overal bed mobility: Modified Independent General bed mobility comments: used bed rail Transfers Overall transfer level: Needs assistance Equipment used: Rolling walker (2 wheeled) Transfers: Sit to/from Stand Sit to Stand: Min guard General transfer comment: for safety Ambulation/Gait Ambulation/Gait assistance: Min guard Ambulation Distance (Feet): 152 Feet Assistive device: Rolling walker (2 wheeled) Gait Pattern/deviations: Step-through pattern Gait velocity: decreased, stopped to cough several times,  get her breath    Exercises     PT Diagnosis: Difficulty walking;Generalized weakness  PT Problem List: Decreased strength;Decreased activity tolerance;Decreased balance;Decreased mobility;Decreased  knowledge of precautions;Decreased cognition;Cardiopulmonary status limiting activity PT Treatment Interventions: DME instruction;Gait training;Functional mobility training;Therapeutic activities;Therapeutic exercise;Patient/family education     PT Goals(Current goals can be found in the care plan section) Acute Rehab PT Goals Patient Stated Goal: none stated; agreeable to PT PT Goal Formulation: With patient Time For Goal Achievement: 01/25/14 Potential to Achieve Goals: Good  Visit Information  Last PT Received On: 01/25/14 Assistance Needed: +1 History of Present Illness: Pt was admitted with CAP/COPD exacerbation.  She has a h/o CAD, CHF, HTN, DM, Fibromyalgia and dementia       Prior Functioning  Home Living Family/patient expects to be discharged to:: Private residence Living Arrangements: Alone;Other relatives Available Help at Discharge: Family;Friend(s) Type of Home: House Home Access: Stairs to enter [employer] of Steps: 1 Entrance Stairs-Rails: Right Home Layout: One level Home Equipment: Walker - 2 wheels Additional Comments: per pt, niece checks on pt. Prior Function Level of Independence: Independent Communication Communication: No difficulties    Cognition  Cognition Arousal/Alertness: Awake/alert Behavior During Therapy: WFL for tasks assessed/performed Overall Cognitive Status: No family/caregiver present to determine baseline cognitive functioning    Extremity/Trunk Assessment Upper Extremity Assessment Upper Extremity Assessment: Defer to OT evaluation Lower Extremity Assessment Lower Extremity Assessment: Generalized weakness Cervical / Trunk Assessment Cervical / Trunk Assessment: Kyphotic   Balance Balance Overall balance assessment: Needs assistance Standing balance support: No upper extremity supported;During functional activity Standing balance-Leahy Scale: Fair Standing balance comment: had pt lean over and pick up object from  floor, performed without LOB, turned in circle, reached around  and turned 180* with outLOB  End of Session PT - End of Session Equipment Utilized During Treatment: Gait belt Activity Tolerance:  Patient limited by fatigue Patient left: in chair;with call bell/phone within reach Nurse Communication: Mobility status  GP     [name] 01/25/2014, 3:12 PM

## 2014-01-25 NOTE — Progress Notes (Signed)
   CARE MANAGEMENT NOTE 01/25/2014  Patient:  SERAI, TUKES   Account Number:  [id number]  Date Initiated:  01/25/2014  Documentation initiated by:  [facility]  Subjective/Objective Assessment:   FIBROMYALGIA, COPD     Action/Plan:   has niece, Joslyn Hy 3 [phone] that with her during the day   Anticipated DC Date:  01/25/2014   Anticipated DC Plan:  [place]  CM consult      [facility] Choice  HOME HEALTH   Choice offered to / List presented to:  C-1 Patient        [place] arranged  [place] RN      [place]   Status of service:  Completed, signed off Medicare Important Message given?   (If response is "NO", the following Medicare IM given date fields will be blank) Date Medicare IM given:   Date Additional Medicare IM given:    Discharge Disposition:  [place]  Per UR Regulation:    If discussed at Long Length of Stay Meetings, dates discussed:    Comments:  01/25/2014 1545 NCM spoke and offered choice for [facility]. Pt states she had [facility] in the past. Gave permission to speak to [place] - [phone]. Dtr states niece, Joslyn Hy # [phone] is with her during the day. Contacted [facility] for [facility] and [facility] for delivery of oxygen. Jonnie Finner RN CCM Case Mgmt phone [phone]

## 2014-01-29 LAB — CULTURE, BLOOD (ROUTINE X 2)
CULTURE: NO GROWTH
CULTURE: NO GROWTH

## 2014-05-06 ENCOUNTER — Emergency Department (HOSPITAL_COMMUNITY): Payer: Medicare Other

## 2014-05-06 ENCOUNTER — Emergency Department (HOSPITAL_COMMUNITY)
Admission: EM | Admit: 2014-05-06 | Discharge: 2014-05-06 | Disposition: A | Discharge status: Home / Self Care | Payer: Medicare Other | Attending: Emergency Medicine | Admitting: Emergency Medicine

## 2014-05-06 ENCOUNTER — Encounter (HOSPITAL_COMMUNITY): Payer: Self-pay | Admitting: Emergency Medicine

## 2014-05-06 DIAGNOSIS — F411 Generalized anxiety disorder: Secondary | ICD-10-CM | POA: Insufficient documentation

## 2014-05-06 DIAGNOSIS — S46909A Unspecified injury of unspecified muscle, fascia and tendon at shoulder and upper arm level, unspecified arm, initial encounter: Secondary | ICD-10-CM | POA: Insufficient documentation

## 2014-05-06 DIAGNOSIS — J4489 Other specified chronic obstructive pulmonary disease: Secondary | ICD-10-CM | POA: Insufficient documentation

## 2014-05-06 DIAGNOSIS — Y9389 Activity, other specified: Secondary | ICD-10-CM | POA: Insufficient documentation

## 2014-05-06 DIAGNOSIS — S1093XA Contusion of unspecified part of neck, initial encounter: Principal | ICD-10-CM

## 2014-05-06 DIAGNOSIS — S0003XA Contusion of scalp, initial encounter: Secondary | ICD-10-CM | POA: Insufficient documentation

## 2014-05-06 DIAGNOSIS — W1809XA Striking against other object with subsequent fall, initial encounter: Secondary | ICD-10-CM | POA: Insufficient documentation

## 2014-05-06 DIAGNOSIS — Z87442 Personal history of urinary calculi: Secondary | ICD-10-CM | POA: Insufficient documentation

## 2014-05-06 DIAGNOSIS — IMO0002 Reserved for concepts with insufficient information to code with codable children: Secondary | ICD-10-CM | POA: Insufficient documentation

## 2014-05-06 DIAGNOSIS — H409 Unspecified glaucoma: Secondary | ICD-10-CM | POA: Insufficient documentation

## 2014-05-06 DIAGNOSIS — S6990XA Unspecified injury of unspecified wrist, hand and finger(s), initial encounter: Secondary | ICD-10-CM

## 2014-05-06 DIAGNOSIS — Z79899 Other long term (current) drug therapy: Secondary | ICD-10-CM | POA: Insufficient documentation

## 2014-05-06 DIAGNOSIS — M79602 Pain in left arm: Secondary | ICD-10-CM

## 2014-05-06 DIAGNOSIS — F17200 Nicotine dependence, unspecified, uncomplicated: Secondary | ICD-10-CM | POA: Insufficient documentation

## 2014-05-06 DIAGNOSIS — S0093XA Contusion of unspecified part of head, initial encounter: Secondary | ICD-10-CM

## 2014-05-06 DIAGNOSIS — Z7982 Long term (current) use of aspirin: Secondary | ICD-10-CM | POA: Insufficient documentation

## 2014-05-06 DIAGNOSIS — E785 Hyperlipidemia, unspecified: Secondary | ICD-10-CM | POA: Insufficient documentation

## 2014-05-06 DIAGNOSIS — Z7902 Long term (current) use of antithrombotics/antiplatelets: Secondary | ICD-10-CM | POA: Insufficient documentation

## 2014-05-06 DIAGNOSIS — M1990 Unspecified osteoarthritis, unspecified site: Secondary | ICD-10-CM | POA: Insufficient documentation

## 2014-05-06 DIAGNOSIS — J449 Chronic obstructive pulmonary disease, unspecified: Secondary | ICD-10-CM | POA: Insufficient documentation

## 2014-05-06 DIAGNOSIS — I2510 Atherosclerotic heart disease of native coronary artery without angina pectoris: Secondary | ICD-10-CM | POA: Insufficient documentation

## 2014-05-06 DIAGNOSIS — Z794 Long term (current) use of insulin: Secondary | ICD-10-CM | POA: Insufficient documentation

## 2014-05-06 DIAGNOSIS — Z880 Allergy status to penicillin: Secondary | ICD-10-CM | POA: Insufficient documentation

## 2014-05-06 DIAGNOSIS — S59919A Unspecified injury of unspecified forearm, initial encounter: Secondary | ICD-10-CM

## 2014-05-06 DIAGNOSIS — I252 Old myocardial infarction: Secondary | ICD-10-CM | POA: Insufficient documentation

## 2014-05-06 DIAGNOSIS — W19XXXA Unspecified fall, initial encounter: Secondary | ICD-10-CM

## 2014-05-06 DIAGNOSIS — S59909A Unspecified injury of unspecified elbow, initial encounter: Secondary | ICD-10-CM | POA: Insufficient documentation

## 2014-05-06 DIAGNOSIS — F0390 Unspecified dementia without behavioral disturbance: Secondary | ICD-10-CM | POA: Insufficient documentation

## 2014-05-06 DIAGNOSIS — E663 Overweight: Secondary | ICD-10-CM | POA: Insufficient documentation

## 2014-05-06 DIAGNOSIS — Y929 Unspecified place or not applicable: Secondary | ICD-10-CM | POA: Insufficient documentation

## 2014-05-06 DIAGNOSIS — S0083XA Contusion of other part of head, initial encounter: Principal | ICD-10-CM | POA: Insufficient documentation

## 2014-05-06 DIAGNOSIS — E119 Type 2 diabetes mellitus without complications: Secondary | ICD-10-CM | POA: Insufficient documentation

## 2014-05-06 DIAGNOSIS — Z8719 Personal history of other diseases of the digestive system: Secondary | ICD-10-CM | POA: Insufficient documentation

## 2014-05-06 DIAGNOSIS — S4980XA Other specified injuries of shoulder and upper arm, unspecified arm, initial encounter: Secondary | ICD-10-CM | POA: Insufficient documentation

## 2014-05-06 DIAGNOSIS — Z9889 Other specified postprocedural states: Secondary | ICD-10-CM | POA: Insufficient documentation

## 2014-05-06 MED ORDER — ACETAMINOPHEN 325 MG PO TABS
650.0000 mg | ORAL_TABLET | Freq: Once | ORAL | Status: AC
  Administered 2014-05-06: 650 mg via ORAL
  Filled 2014-05-06: qty 2

## 2014-05-06 NOTE — ED Notes (Addendum)
Patient states she was attempting to get up to use restroom and got tangled and fell onto the floor. Patient takes ASA. Patient and family denies LOC. Patient also c/o left shoulder pain

## 2014-05-06 NOTE — ED Notes (Signed)
Pt ambulated from triage to room with assistance from tech

## 2014-05-06 NOTE — ED Provider Notes (Signed)
CSN: [id number]     Arrival date & time 05/06/14  5465 History   First MD Initiated Contact with Patient 05/06/14 [phone]     Chief Complaint  Patient presents with  . Fall    hit head, takes ASA daiily     (Consider location/radiation/quality/duration/timing/severity/associated sxs/prior Treatment) Patient is a 73 y.o. female presenting with fall. The history is provided by the patient.  Fall This is a new problem. The current episode started 3 to 5 hours ago. Episode frequency: once. The problem has been resolved. Pertinent negatives include no chest pain, no abdominal pain, no headaches and no shortness of breath. Nothing aggravates the symptoms. Nothing relieves the symptoms. She has tried nothing for the symptoms. The treatment provided no relief.    Past Medical History  Diagnosis Date  . CAD (coronary artery disease)     a. s/p inferior STEMI 12/29/10 with rotablator atherectomy RCA 12/31/10 and DES x 2 RCA;  b. Lexiscan Myoview (11/2013):  Low risk study, no ischemia, inferolateral defect likely consistent with attenuation versus scar.  . Ischemic cardiomyopathy     EF 45%cath 2012 EF55% 5/12; 35% 11/14  . Diabetes mellitus     type 2  . HTN (hypertension)   . Hyperlipidemia   . Diverticulosis   . Respiratory arrest//ACE Inhibitor presumed cause   . COPD (chronic obstructive pulmonary disease)   . Small bowel obstruction     from a sigmoid stricture  . Nephrolithiasis   . Sleep apnea   . Overweight   . Osteoarthritis   . GERD (gastroesophageal reflux disease)   . Anxiety   . Glaucoma   . Memory deficit   . Dementia    Past Surgical History  Procedure Laterality Date  . Bilateral tubal ligation    . Lithotripsy    . 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 Cardiac surgery     Family History  Problem Relation Age of Onset  . Breast cancer Maternal Grandmother   . Diabetes Father   . Heart disease Father   . Diabetes Sister   . Colon  cancer Neg Hx    History  Substance Use Topics  . Smoking status: Current Every Day Smoker -- 1.00 packs/day for 25 years    Types: Cigarettes  . Smokeless tobacco: Never Used     Comment: over a ppd for 40+ years; quit 10/2004.  unsuccessfully tried eCigs.  . Alcohol Use: 0.5 oz/week    1 drink(s) per week     Comment: rare    OB History   Grav Para Term Preterm Abortions TAB SAB Ect Mult Living                 Review of Systems  Constitutional: Negative for fever and fatigue.  HENT: Negative for congestion and drooling.   Eyes: Negative for pain.  Respiratory: Negative for cough and shortness of breath.   Cardiovascular: Negative for chest pain.  Gastrointestinal: Negative for nausea, vomiting, abdominal pain and diarrhea.  Genitourinary: Negative for dysuria and hematuria.  Musculoskeletal: Negative for back pain, gait problem and neck pain.  Skin: Negative for color change.  Neurological: Negative for dizziness and headaches.  Hematological: Negative for adenopathy.  Psychiatric/Behavioral: Negative for behavioral problems.  All other systems reviewed and are negative.     Allergies  Lisinopril; Eggs or egg-derived products; Erythromycin; Iodine; Penicillins; Pentazocine lactate; Propoxyphene hcl; Quinapril hcl; Spironolactone; and Sulfonamide derivatives  Home Medications   Prior  to Admission medications   Medication Sig Start Date End Date Taking? Authorizing Provider  albuterol (PROVENTIL HFA;VENTOLIN HFA) 108 (90 BASE) MCG/ACT inhaler Inhale 2 puffs into the lungs every 6 (six) hours as needed for wheezing.   Yes Historical Provider, MD  albuterol (PROVENTIL) (2.5 MG/3ML) 0.083% nebulizer solution Take 2.5 mg by nebulization every 6 (six) hours as needed for wheezing or shortness of breath.    Yes Historical Provider, MD  aspirin EC 81 MG tablet Take 81 mg by mouth daily.   Yes Historical Provider, MD  clopidogrel (PLAVIX) 75 MG tablet Take 75 mg by mouth every  morning.   Yes Historical Provider, MD  donepezil (ARICEPT) 5 MG tablet Take 5 mg by mouth daily.   Yes Historical Provider, MD  furosemide (LASIX) 20 MG tablet Take 1 tablet (20 mg total) by mouth as needed. 10/28/13  Yes Scott T Kathlen Mody, PA-C  insulin glargine (LANTUS) 100 UNIT/ML injection Inject 0.1 mLs (10 Units total) into the skin daily. 01/25/14  Yes Hosie Poisson, MD  insulin lispro (HUMALOG) 100 UNIT/ML injection Inject 0-10 Units into the skin 3 (three) times daily before meals. Sliding scale: below 150=0 units, 150-200=2 units, 201-250=4 units, 251-300=6 units, 301-350=8 units, above 350=10 units   Yes Historical Provider, MD  ipratropium (ATROVENT) 0.02 % nebulizer solution Take 0.5 mg by nebulization 4 (four) times daily. Four times daily 10/30/13  Yes [name] S Parrett, NP  isosorbide mononitrate (IMDUR) 30 MG 24 hr tablet Take 1 tablet (30 mg total) by mouth daily. 10/14/13  Yes Donita Brooks, NP  LORazepam (ATIVAN) 0.5 MG tablet Take 0.5 mg by mouth 2 (two) times daily as needed for anxiety.   Yes Historical Provider, MD  metoprolol succinate (TOPROL-XL) 25 MG 24 hr tablet Take 1 tablet (25 mg total) by mouth daily. 10/14/13  Yes Donita Brooks, NP  mometasone-formoterol (DULERA) 100-5 MCG/ACT AERO Inhale 2 puffs into the lungs 2 (two) times daily.   Yes Historical Provider, MD  nitroGLYCERIN (NITROSTAT) 0.4 MG SL tablet Place 1 tablet (0.4 mg total) under the tongue every 5 (five) minutes as needed. For chest pain. 06/18/13  Yes Burnell Blanks, MD  PARoxetine (PAXIL) 10 MG tablet Take 10 mg by mouth daily.   Yes Historical Provider, MD  polyethylene glycol (MIRALAX / GLYCOLAX) packet Take 17 g by mouth daily as needed (for constipation).   Yes Historical Provider, MD  pravastatin (PRAVACHOL) 40 MG tablet Take 40 mg by mouth daily.   Yes Historical Provider, MD  pregabalin (LYRICA) 75 MG capsule Take 75 mg by mouth 2 (two) times daily.     Yes Historical Provider, MD  senna-docusate  (SENOKOT-S) 8.6-50 MG per tablet Take 2 tablets by mouth at bedtime. Hold for diarrhea 10/14/13  Yes Donita Brooks, NP  tolterodine (DETROL LA) 4 MG 24 hr capsule Take 4 mg by mouth every morning.    Yes Historical Provider, MD  Travoprost, BAK Free, (TRAVATAN) 0.004 % SOLN ophthalmic solution Place 1 drop into both eyes daily.   Yes Historical Provider, MD   BP 136/77  Pulse 75  Temp(Src) 97.7 F (36.5 C) (Oral)  Resp 18  Wt 142 lb (64.411 kg)  SpO2 95% Physical Exam  Nursing note and vitals reviewed. Constitutional: She is oriented to person, place, and time. She appears well-developed and well-nourished.  HENT:  Head: Normocephalic.  Mouth/Throat: Oropharynx is clear and moist. No oropharyngeal exudate.  Tympanic membranes clear bilaterally.   Mild hematoma and  bruising to the right forehead.   Eyes: Conjunctivae and EOM are normal. Pupils are equal, round, and reactive to light.  Neck: Normal range of motion. Neck supple.  No vertebral tenderness to palpation.  Cardiovascular: Normal rate, regular rhythm, normal heart sounds and intact distal pulses.  Exam reveals no gallop and no friction rub.   No murmur heard. Pulmonary/Chest: Effort normal and breath sounds normal. No respiratory distress. She has no wheezes.  Abdominal: Soft. Bowel sounds are normal. There is no tenderness. There is no rebound and no guarding.  Musculoskeletal: Normal range of motion. She exhibits tenderness. She exhibits no edema.  Mild-to-moderate tenderness of the left lateral shoulder and proximal left humerus.  Mild left lateral elbow tenderness to palpation.  Normal sensation and 2+ distal pulses in the upper extremities.  Normal strength in lower extremities without any hip tenderness to palpation.  Neurological: She is alert and oriented to person, place, and time.  Skin: Skin is warm and dry.  Psychiatric: She has a normal mood and affect. Her behavior is normal.    ED Course  Procedures  (including critical care time) Labs Review Labs Reviewed - No data to display  Imaging Review Dg Elbow Complete Left  05/06/2014   CLINICAL DATA:  Fall, left arm pain  EXAM: LEFT ELBOW - COMPLETE 3+ VIEW  COMPARISON:  None.  FINDINGS: No evidence of fracture of the ulna or humerus. The radial head is normal. No joint effusion.  IMPRESSION: No evidence of elbow fracture.  No effusion.   Electronically Signed   By: Suzy Bouchard M.D.   On: 05/06/2014 10:08   Ct Head Wo Contrast  05/06/2014   CLINICAL DATA:  Trauma.  EXAM: CT HEAD WITHOUT CONTRAST  TECHNIQUE: Contiguous axial images were obtained from the base of the skull through the vertex without intravenous contrast.  COMPARISON:  MRI brain 04/08/2013.  FINDINGS: No mass. No hydrocephalus. No hemorrhage. Periventricular white matter changes are noted consistent with chronic ischemia. 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 These are stable. Right frontal soft tissue swelling. No underlying fracture.  IMPRESSION: 1. Chronic ischemic change.  No acute intracranial abnormality. 2. Soft tissue swelling right frontal region. No underlying fracture.   Electronically Signed   By: Marcello Moores  Register   On: 05/06/2014 09:34   Dg Shoulder Left  05/06/2014   CLINICAL DATA:  Fall, left arm pain  EXAM: LEFT SHOULDER - 2+ VIEW  COMPARISON:  None.  FINDINGS: Glenohumeral joint is intact. No evidence of scapular fracture or humeral fracture. The acromioclavicular joint is intact.  IMPRESSION: No fracture or dislocation   Electronically Signed   By: Suzy Bouchard M.D.   On: 05/06/2014 10:05   Dg Humerus Left  05/06/2014   CLINICAL DATA:  Status post fall now with left arm and shoulder discomfort  EXAM: LEFT HUMERUS - 2+ VIEW  COMPARISON:  Left shoulder series of today's date  FINDINGS: The humerus is mildly osteopenic. There is no acute fracture. The observed portions of the shoulder exhibit no acute abnormalities. The elbow  is not included in the field of view. The overlying soft tissues are unremarkable.  IMPRESSION: There is no acute bony abnormality of the left humeral shaft.   Electronically Signed   By: David  Martinique   On: 05/06/2014 10:06     EKG Interpretation None      MDM   Final diagnoses:  Fall  Traumatic hematoma of head  Left arm  pain    9:14 AM 73 y.o. female on plavix who presents with a mechanical fall which occurred at approximately 5:30 AM this morning when she was getting up to use the bathroom. She believes she got tingling in her bed sheets. She fell forward hitting her left shoulder and forehead. She denies any loss of consciousness. She is afebrile and vital signs are unremarkable here. She has a 3/10 headache. She has been ambulatory since that time. Will get screening imaging and Tylenol for pain.  10:28 AM: Pt continues to appear well.  I interpreted/reviewed the imaging which was non-contributory.   I have discussed the diagnosis/risks/treatment options with the patient and family and believe the pt to be eligible for discharge home to follow-up with pcp as needed. We also discussed returning to the ED immediately if new or worsening sx occur. We discussed the sx which are most concerning (e.g., worsening pain, AMS, gait instability ) that necessitate immediate return. Medications administered to the patient during their visit and any new prescriptions provided to the patient are listed below.  Medications given during this visit Medications  acetaminophen (TYLENOL) tablet 650 mg (650 mg Oral Given 05/06/14 0933)    New Prescriptions   No medications on file     Blanchard Kelch, MD 05/06/14 1029

## 2014-05-06 NOTE — Discharge Instructions (Signed)
Contusion °A contusion is a deep bruise. Contusions are the result of an injury that caused bleeding under the skin. The contusion may turn blue, purple, or yellow. Minor injuries will give you a painless contusion, but more severe contusions may stay painful and swollen for a few weeks.  °CAUSES  °A contusion is usually caused by a blow, trauma, or direct force to an area of the body. °SYMPTOMS  °· Swelling and redness of the injured area. °· Bruising of the injured area. °· Tenderness and soreness of the injured area. °· Pain. °DIAGNOSIS  °The diagnosis can be made by taking a history and physical exam. An X-ray, CT scan, or MRI may be needed to determine if there were any associated injuries, such as fractures. °TREATMENT  °Specific treatment will depend on what area of the body was injured. In general, the best treatment for a contusion is resting, icing, elevating, and applying cold compresses to the injured area. Over-the-counter medicines may also be recommended for pain control. Ask your caregiver what the best treatment is for your contusion. °HOME CARE INSTRUCTIONS  °· Put ice on the injured area. °¨ Put ice in a plastic bag. °¨ Place a towel between your skin and the bag. °¨ Leave the ice on for 15-20 minutes, 3-4 times a day, or as directed by your health care provider. °· Only take over-the-counter or prescription medicines for pain, discomfort, or fever as directed by your caregiver. Your caregiver may recommend avoiding anti-inflammatory medicines (aspirin, ibuprofen, and naproxen) for 48 hours because these medicines may increase bruising. °· Rest the injured area. °· If possible, elevate the injured area to reduce swelling. °SEEK IMMEDIATE MEDICAL CARE IF:  °· You have increased bruising or swelling. °· You have pain that is getting worse. °· Your swelling or pain is not relieved with medicines. °MAKE SURE YOU:  °· Understand these instructions. °· Will watch your condition. °· Will get help right  away if you are not doing well or get worse. °Document Released: 08/16/2005 Document Revised: 11/11/2013 Document Reviewed: 09/11/2011 °[employer]® Patient Information ©2015 [employer]. This information is not intended to replace advice given to you by your health care provider. Make sure you discuss any questions you have with your health care provider. ° °

## 2014-06-19 ENCOUNTER — Other Ambulatory Visit: Payer: Self-pay | Admitting: Cardiovascular Disease

## 2014-07-02 ENCOUNTER — Ambulatory Visit (INDEPENDENT_AMBULATORY_CARE_PROVIDER_SITE_OTHER): Payer: Medicare Other | Admitting: Adult Health

## 2014-07-02 ENCOUNTER — Encounter: Payer: Self-pay | Admitting: Adult Health

## 2014-07-02 VITALS — BP 136/74 | HR 67 | Ht 65.0 in | Wt 150.0 lb

## 2014-07-02 DIAGNOSIS — R413 Other amnesia: Secondary | ICD-10-CM

## 2014-07-02 MED ORDER — MEMANTINE HCL 28 X 5 MG & 21 X 10 MG PO TABS: ORAL_TABLET | ORAL | Status: DC
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
## 2014-07-02 NOTE — Patient Instructions (Signed)
Memantine Tablets  What is this medicine?  MEMANTINE (MEM an teen) is used to treat dementia caused by Alzheimer's disease.  This medicine may be used for other purposes; ask your health care provider or pharmacist if you have questions.  COMMON BRAND NAME(S): Namenda  What should I tell my health care provider before I take this medicine?  They need to know if you have any of these conditions:  -difficulty passing urine  -kidney disease  -liver disease  -seizures  -an unusual or allergic reaction to memantine, other medicines, foods, dyes, or preservatives  -pregnant or trying to get pregnant  -breast-feeding  How should I use this medicine?  Take this medicine by mouth with a glass of water. Follow the directions on the prescription label. You may take this medicine with or without food. Take your doses at regular intervals. Do not take your medicine more often than directed. Continue to take your medicine even if you feel better. Do not stop taking except on the advice of your doctor or health care professional.  Talk to your pediatrician regarding the use of this medicine in children. Special care may be needed.  Overdosage: If you think you have taken too much of this medicine contact a poison control center or emergency room at once.  NOTE: This medicine is only for you. Do not share this medicine with others.  What if I miss a dose?  If you miss a dose, take it as soon as you can. If it is almost time for your next dose, take only that dose. Do not take double or extra doses. If you do not take your medicine for several days, contact your health care provider. Your dose may need to be changed.  What may interact with this medicine?  -acetazolamide  -amantadine  -cimetidine  -dextromethorphan  -dofetilide  -hydrochlorothiazide  -ketamine  -metformin  -methazolamide  -quinidine  -ranitidine  -sodium bicarbonate  -triamterene  This list may not describe all possible interactions. Give your health care provider a  list of all the medicines, herbs, non-prescription drugs, or dietary supplements you use. Also tell them if you smoke, drink alcohol, or use illegal drugs. Some items may interact with your medicine.  What should I watch for while using this medicine?  Visit your doctor or health care professional for regular checks on your progress. Check with your doctor or health care professional if there is no improvement in your symptoms or if they get worse.  You may get drowsy or dizzy. Do not drive, use machinery, or do anything that needs mental alertness until you know how this drug affects you. Do not stand or sit up quickly, especially if you are an older patient. This reduces the risk of dizzy or fainting spells. Alcohol can make you more drowsy and dizzy. Avoid alcoholic drinks.  What side effects may I notice from receiving this medicine?  Side effects that you should report to your doctor or health care professional as soon as possible:  -allergic reactions like skin rash, itching or hives, swelling of the face, lips, or tongue  -agitation or a feeling of restlessness  -depressed mood  -dizziness  -hallucinations  -redness, blistering, peeling or loosening of the skin, including inside the mouth  -seizures  -vomiting  Side effects that usually do not require medical attention (report to your doctor or health care professional if they continue or are bothersome):  -constipation  -diarrhea  -headache  -nausea  -trouble sleeping  This   list may not describe all possible side effects. Call your doctor for medical advice about side effects. You may report side effects to FDA at 1-800-FDA-1088.  Where should I keep my medicine?  Keep out of the reach of children.  Store at room temperature between 15 degrees and 30 degrees C (59 degrees and 86 degrees F). Throw away any unused medicine after the expiration date.  NOTE: This sheet is a summary. It may not cover all possible information. If you have questions about this  medicine, talk to your doctor, pharmacist, or health care provider.  © 2015, [employer]. (2013-08-25 14:10:42)

## 2014-07-03 NOTE — Progress Notes (Signed)
I agree with the assessment and plan as directed by NP .The patient is known to me .   Rajean Desantiago, MD  

## 2014-07-15 ENCOUNTER — Other Ambulatory Visit: Payer: Self-pay | Admitting: Cardiovascular Disease

## 2014-07-29 ENCOUNTER — Encounter: Payer: Self-pay | Admitting: Internal Medicine

## 2014-08-10 ENCOUNTER — Other Ambulatory Visit: Payer: Self-pay | Admitting: Cardiovascular Disease

## 2014-08-12 ENCOUNTER — Telehealth: Payer: Self-pay | Admitting: Adult Health

## 2014-08-12 NOTE — Telephone Encounter (Signed)
Patient's daughter calling to state that patient is at the end of her Namenda titration pack and she is ready for her script to be called in, she uses the [facility] on [address] and [address]. Please return call and advise.

## 2014-08-13 MED ORDER — MEMANTINE HCL ER 28 MG PO CP24: 28.0000 mg | ORAL_CAPSULE | Freq: Every day | ORAL | Status: DC

## 2014-08-13 NOTE — Telephone Encounter (Signed)
Please advise previous note. Thanks  °

## 2014-08-13 NOTE — Telephone Encounter (Signed)
I sent the prescription for namenda ER 28 mg. I called the daughter and let her know that it was at the pharmacy for her.

## 2014-09-04 ENCOUNTER — Other Ambulatory Visit: Payer: Self-pay

## 2014-09-05 ENCOUNTER — Other Ambulatory Visit: Payer: Self-pay | Admitting: Cardiovascular Disease

## 2014-09-06 ENCOUNTER — Other Ambulatory Visit: Payer: Self-pay | Admitting: Cardiovascular Disease

## 2014-10-02 ENCOUNTER — Other Ambulatory Visit: Payer: Self-pay | Admitting: Cardiovascular Disease

## 2014-10-04 ENCOUNTER — Other Ambulatory Visit: Payer: Self-pay | Admitting: Cardiovascular Disease

## 2014-11-02 ENCOUNTER — Other Ambulatory Visit: Payer: Self-pay | Admitting: Cardiovascular Disease

## 2014-11-10 ENCOUNTER — Other Ambulatory Visit: Payer: Self-pay | Admitting: Cardiovascular Disease

## 2014-11-15 ENCOUNTER — Other Ambulatory Visit: Payer: Self-pay | Admitting: Cardiovascular Disease

## 2014-11-16 NOTE — Telephone Encounter (Signed)
Rx(s) sent to pharmacy electronically.  

## 2014-12-24 ENCOUNTER — Other Ambulatory Visit: Payer: Self-pay | Admitting: Cardiovascular Disease

## 2014-12-30 ENCOUNTER — Inpatient Hospital Stay (HOSPITAL_COMMUNITY)
Admission: EM | Admit: 2014-12-30 | Discharge: 2015-01-01 | DRG: 194 | Disposition: A | Discharge status: Home / Self Care | Payer: Medicare Other | Attending: Internal Medicine | Admitting: Internal Medicine

## 2014-12-30 ENCOUNTER — Emergency Department (HOSPITAL_COMMUNITY): Payer: Medicare Other

## 2014-12-30 ENCOUNTER — Encounter (HOSPITAL_COMMUNITY): Payer: Self-pay | Admitting: *Deleted

## 2014-12-30 DIAGNOSIS — R059 Cough, unspecified: Secondary | ICD-10-CM

## 2014-12-30 DIAGNOSIS — Z7982 Long term (current) use of aspirin: Secondary | ICD-10-CM

## 2014-12-30 DIAGNOSIS — I10 Essential (primary) hypertension: Secondary | ICD-10-CM | POA: Diagnosis present

## 2014-12-30 DIAGNOSIS — I5022 Chronic systolic (congestive) heart failure: Secondary | ICD-10-CM | POA: Diagnosis present

## 2014-12-30 DIAGNOSIS — Z79899 Other long term (current) drug therapy: Secondary | ICD-10-CM

## 2014-12-30 DIAGNOSIS — I255 Ischemic cardiomyopathy: Secondary | ICD-10-CM | POA: Diagnosis present

## 2014-12-30 DIAGNOSIS — I2510 Atherosclerotic heart disease of native coronary artery without angina pectoris: Secondary | ICD-10-CM | POA: Diagnosis present

## 2014-12-30 DIAGNOSIS — K219 Gastro-esophageal reflux disease without esophagitis: Secondary | ICD-10-CM | POA: Diagnosis present

## 2014-12-30 DIAGNOSIS — E785 Hyperlipidemia, unspecified: Secondary | ICD-10-CM | POA: Diagnosis present

## 2014-12-30 DIAGNOSIS — J189 Pneumonia, unspecified organism: Secondary | ICD-10-CM | POA: Diagnosis not present

## 2014-12-30 DIAGNOSIS — J9811 Atelectasis: Secondary | ICD-10-CM | POA: Diagnosis present

## 2014-12-30 DIAGNOSIS — Z66 Do not resuscitate: Secondary | ICD-10-CM | POA: Diagnosis present

## 2014-12-30 DIAGNOSIS — R05 Cough: Secondary | ICD-10-CM

## 2014-12-30 DIAGNOSIS — J441 Chronic obstructive pulmonary disease with (acute) exacerbation: Secondary | ICD-10-CM | POA: Diagnosis present

## 2014-12-30 DIAGNOSIS — J449 Chronic obstructive pulmonary disease, unspecified: Secondary | ICD-10-CM | POA: Diagnosis present

## 2014-12-30 DIAGNOSIS — F419 Anxiety disorder, unspecified: Secondary | ICD-10-CM | POA: Diagnosis present

## 2014-12-30 DIAGNOSIS — F0390 Unspecified dementia without behavioral disturbance: Secondary | ICD-10-CM | POA: Diagnosis present

## 2014-12-30 DIAGNOSIS — Z7902 Long term (current) use of antithrombotics/antiplatelets: Secondary | ICD-10-CM

## 2014-12-30 DIAGNOSIS — F17210 Nicotine dependence, cigarettes, uncomplicated: Secondary | ICD-10-CM | POA: Diagnosis present

## 2014-12-30 DIAGNOSIS — E119 Type 2 diabetes mellitus without complications: Secondary | ICD-10-CM

## 2014-12-30 DIAGNOSIS — H409 Unspecified glaucoma: Secondary | ICD-10-CM | POA: Diagnosis present

## 2014-12-30 DIAGNOSIS — M1990 Unspecified osteoarthritis, unspecified site: Secondary | ICD-10-CM | POA: Diagnosis present

## 2014-12-30 DIAGNOSIS — Y95 Nosocomial condition: Secondary | ICD-10-CM | POA: Diagnosis present

## 2014-12-30 DIAGNOSIS — Z794 Long term (current) use of insulin: Secondary | ICD-10-CM

## 2014-12-30 DIAGNOSIS — G4730 Sleep apnea, unspecified: Secondary | ICD-10-CM | POA: Diagnosis present

## 2014-12-30 NOTE — ED Notes (Signed)
EKG given to EDP, Gentry,MD., for review.

## 2014-12-30 NOTE — ED Notes (Signed)
Pt placed on 2L [place] per EDP order

## 2014-12-30 NOTE — ED Notes (Signed)
Pt's niece reports pt has been having SOB since yesterday, worse today.  Pt reports chest tightness as well.  Pt's niece reports tonight, while pt was coughing, pt "turned purple."  She reports pt was recently treated for PNA.  Pt is A&O per norm.

## 2014-12-30 NOTE — ED Notes (Signed)
Pt states recently had pna and yesterday states started having shortness of breath again, states is coughing and at times coughs mucus up, states she is unsure of color stating "I don't look at it", pt coughing in room. Pt able to talk in complete sentences.

## 2014-12-30 NOTE — ED Provider Notes (Signed)
CSN: [id number]     Arrival date & time 12/30/14  2306 History   First MD Initiated Contact with Patient 12/30/14 2334     Chief Complaint  Patient presents with  . Shortness of Breath     (Consider location/radiation/quality/duration/timing/severity/associated sxs/prior Treatment) HPI Patient with history of coronary artery disease, ischemic cardiomyopathy, diabetes, hypertension and COPD presents with worsening shortness of breath the past 2 days. Patient states she's had cough productive of mucus. No fever or chills. She does endorse chest tightness when trying to take deep breath. She's had no lower extremity swelling or pain. Patient states she was recently treated for pneumonia. Does not remember the antibiotic she was given. Past Medical History  Diagnosis Date  . CAD (coronary artery disease)     a. s/p inferior STEMI 12/29/10 with rotablator atherectomy RCA 12/31/10 and DES x 2 RCA;  b. Lexiscan Myoview (11/2013):  Low risk study, no ischemia, inferolateral defect likely consistent with attenuation versus scar.  . Ischemic cardiomyopathy     EF 45%cath 2012 EF55% 5/12; 35% 11/14  . Diabetes mellitus     type 2  . HTN (hypertension)   . Hyperlipidemia   . Diverticulosis   . Respiratory arrest//ACE Inhibitor presumed cause   . COPD (chronic obstructive pulmonary disease)   . Small bowel obstruction     from a sigmoid stricture  . Nephrolithiasis   . Sleep apnea   . Overweight(278.02)   . Osteoarthritis   . GERD (gastroesophageal reflux disease)   . Anxiety   . Glaucoma   . Memory deficit   . Dementia    Past Surgical History  Procedure Laterality Date  . Bilateral tubal ligation    . Lithotripsy    . 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 Cardiac surgery     Family History  Problem Relation Age of Onset  . Breast cancer Maternal Grandmother   . Diabetes Father   . Heart disease Father   . Diabetes Sister   . Colon cancer Neg Hx     History  Substance Use Topics  . Smoking status: Current Every Day Smoker -- 1.00 packs/day for 25 years    Types: Cigarettes  . Smokeless tobacco: Never Used     Comment: over a ppd for 40+ years; quit 10/2004.  unsuccessfully tried eCigs.  . Alcohol Use: No   OB History    No data available     Review of Systems  Constitutional: Negative for chills.  Respiratory: Positive for cough, chest tightness and shortness of breath.   Cardiovascular: Negative for chest pain, palpitations and leg swelling.  Gastrointestinal: Negative for nausea, vomiting, abdominal pain and diarrhea.  Musculoskeletal: Negative for back pain.  Skin: Negative for rash.  Neurological: Negative for dizziness, weakness, numbness and headaches.  All other systems reviewed and are negative.     Allergies  Lisinopril; Eggs or egg-derived products; Erythromycin; Iodine; Penicillins; Pentazocine lactate; Propoxyphene hcl; Quinapril hcl; Spironolactone; and Sulfonamide derivatives  Home Medications   Prior to Admission medications   Medication Sig Start Date End Date Taking? Authorizing Provider  aspirin EC 81 MG tablet Take 81 mg by mouth daily.   Yes Historical Provider, MD  clopidogrel (PLAVIX) 75 MG tablet TAKE 1 TABLET BY MOUTH EVERY DAY 11/11/14  Yes Burnell Blanks, MD  insulin glargine (LANTUS) 100 UNIT/ML injection Inject 0.1 mLs (10 Units total) into the skin daily. 01/25/14  Yes Hosie Poisson, MD  isosorbide mononitrate (  IMDUR) 30 MG 24 hr tablet Take 1 tablet (30 mg total) by mouth daily. 10/14/13  Yes Donita Brooks, NP  Linagliptin-Metformin HCl 2.03-999 MG TABS Take 1 tablet by mouth 2 (two) times daily.   Yes Historical Provider, MD  metoprolol succinate (TOPROL-XL) 25 MG 24 hr tablet Take 1 tablet (25 mg total) by mouth daily. 10/14/13  Yes Donita Brooks, NP  PARoxetine (PAXIL) 10 MG tablet Take 10 mg by mouth daily.   Yes Historical Provider, MD  pravastatin (PRAVACHOL) 40 MG tablet TAKE  1 TABLET (40 MG TOTAL) BY MOUTH DAILY. <PLEASE MAKE APPOINTMENT FOR REFILLS> 12/25/14  Yes Burnell Blanks, MD  pregabalin (LYRICA) 75 MG capsule Take 75 mg by mouth 2 (two) times daily.     Yes Historical Provider, MD  tolterodine (DETROL LA) 4 MG 24 hr capsule Take 4 mg by mouth every morning.    Yes Historical Provider, MD  albuterol (PROVENTIL HFA;VENTOLIN HFA) 108 (90 BASE) MCG/ACT inhaler Inhale 2 puffs into the lungs every 6 (six) hours as needed for wheezing.    Historical Provider, MD  albuterol (PROVENTIL) (2.5 MG/3ML) 0.083% nebulizer solution Take 2.5 mg by nebulization every 6 (six) hours as needed for wheezing or shortness of breath.     Historical Provider, MD  furosemide (LASIX) 20 MG tablet Take 1 tablet (20 mg total) by mouth as needed. 10/28/13   Liliane Shi, PA-C  insulin lispro (HUMALOG) 100 UNIT/ML injection Inject 0-10 Units into the skin 3 (three) times daily before meals. Sliding scale: below 150=0 units, 150-200=2 units, 201-250=4 units, 251-300=6 units, 301-350=8 units, above 350=10 units    Historical Provider, MD  ipratropium (ATROVENT) 0.02 % nebulizer solution Take 0.5 mg by nebulization 4 (four) times daily. Four times daily 10/30/13   [name] S Parrett, NP  LORazepam (ATIVAN) 0.5 MG tablet Take 0.5 mg by mouth 2 (two) times daily as needed for anxiety.    Historical Provider, MD  Memantine HCl ER 28 MG CP24 Take 28 mg by mouth daily. 08/13/14   Ward Givens, NP  metFORMIN (GLUCOPHAGE) 500 MG tablet Take 500 mg by mouth daily before supper. Taking 4 pills at dinner    Historical Provider, MD  mometasone-formoterol (DULERA) 100-5 MCG/ACT AERO Inhale 2 puffs into the lungs 2 (two) times daily.    Historical Provider, MD  nitroGLYCERIN (NITROSTAT) 0.4 MG SL tablet Place 1 tablet (0.4 mg total) under the tongue every 5 (five) minutes as needed. For chest pain. 06/18/13   Burnell Blanks, MD  polyethylene glycol (MIRALAX / Floria Raveling) packet Take 17 g by mouth daily  as needed (for constipation).    Historical Provider, MD  senna-docusate (SENOKOT-S) 8.6-50 MG per tablet Take 2 tablets by mouth at bedtime. Hold for diarrhea 10/14/13   Donita Brooks, NP  Travoprost, BAK Free, (TRAVATAN) 0.004 % SOLN ophthalmic solution Place 1 drop into both eyes daily.    Historical Provider, MD   BP 160/77 mmHg  Pulse 69  Temp(Src) 98.3 F (36.8 C) (Oral)  Resp 19  Ht 5\' 5"  (1.651 m)  Wt 155 lb 12.8 oz (70.67 kg)  BMI 25.93 kg/m2  SpO2 96% Physical Exam  Constitutional: She is oriented to person, place, and time. She appears well-developed and well-nourished. No distress.  HENT:  Head: Normocephalic and atraumatic.  Mouth/Throat: Oropharynx is clear and moist.  Eyes: EOM are normal. Pupils are equal, round, and reactive to light.  Neck: Normal range of motion. Neck supple.  Cardiovascular: Normal rate and regular rhythm.   Pulmonary/Chest: Effort normal. No respiratory distress. She has no wheezes. She has rales.  Scattered rales  Abdominal: Soft. Bowel sounds are normal. She exhibits no distension and no mass. There is no tenderness. There is no rebound and no guarding.  Musculoskeletal: Normal range of motion. She exhibits no edema or tenderness.  No calf swelling or tenderness.  Neurological: She is alert and oriented to person, place, and time.  Moves all extremities without deficit. Sensation is grossly intact  Skin: Skin is warm and dry. No rash noted. No erythema.  Psychiatric: She has a normal mood and affect. Her behavior is normal.  Nursing note and vitals reviewed.   ED Course  Procedures (including critical care time) Labs Review Labs Reviewed  BASIC METABOLIC PANEL - Abnormal; Notable for the following:    Glucose, Bld 136 (*)    GFR calc non Af Amer 66 (*)    GFR calc Af Amer 77 (*)    All other components within normal limits  CBC - Abnormal; Notable for the following:    Platelets 121 (*)    All other components within normal limits   BRAIN NATRIURETIC PEPTIDE  I-STAT TROPOININ, ED    Imaging Review Dg Chest 2 View (if Patient Has Fever And/or Copd)  12/31/2014   CLINICAL DATA:  Pneumonia yesterday and now has shortness of Breath again. Coughing.  EXAM: CHEST  2 VIEW  COMPARISON:  01/23/2014  FINDINGS: Normal heart size and pulmonary vascularity. Infiltration or atelectasis in the left lung base anteriorly compatible with pneumonia. Probable small left pleural effusion. Emphysematous changes in the lungs. No pneumothorax. Calcified and tortuous aorta.  IMPRESSION: A infiltration in the left lung base anteriorly with small left pleural effusion suggesting pneumonia.   Electronically Signed   By: Lucienne Capers M.D.   On: 12/31/2014 00:48     EKG Interpretation None      MDM   Final diagnoses:  HCAP (healthcare-associated pneumonia)   Patient started on antibiotics for healthcare associated pneumonia. Discussed with [facility] hospitalist and will admit patient.     Julianne Rice, MD 12/31/14 [phone]

## 2014-12-31 ENCOUNTER — Encounter (HOSPITAL_COMMUNITY): Payer: Self-pay | Admitting: *Deleted

## 2014-12-31 ENCOUNTER — Inpatient Hospital Stay (HOSPITAL_COMMUNITY): Payer: Medicare Other

## 2014-12-31 DIAGNOSIS — Z79899 Other long term (current) drug therapy: Secondary | ICD-10-CM | POA: Diagnosis not present

## 2014-12-31 DIAGNOSIS — H409 Unspecified glaucoma: Secondary | ICD-10-CM | POA: Diagnosis present

## 2014-12-31 DIAGNOSIS — F419 Anxiety disorder, unspecified: Secondary | ICD-10-CM | POA: Diagnosis present

## 2014-12-31 DIAGNOSIS — J9811 Atelectasis: Secondary | ICD-10-CM | POA: Diagnosis present

## 2014-12-31 DIAGNOSIS — Z7902 Long term (current) use of antithrombotics/antiplatelets: Secondary | ICD-10-CM | POA: Diagnosis not present

## 2014-12-31 DIAGNOSIS — Z7982 Long term (current) use of aspirin: Secondary | ICD-10-CM | POA: Diagnosis not present

## 2014-12-31 DIAGNOSIS — J189 Pneumonia, unspecified organism: Secondary | ICD-10-CM | POA: Diagnosis present

## 2014-12-31 DIAGNOSIS — R05 Cough: Secondary | ICD-10-CM | POA: Diagnosis present

## 2014-12-31 DIAGNOSIS — E785 Hyperlipidemia, unspecified: Secondary | ICD-10-CM | POA: Diagnosis present

## 2014-12-31 DIAGNOSIS — G4730 Sleep apnea, unspecified: Secondary | ICD-10-CM | POA: Diagnosis present

## 2014-12-31 DIAGNOSIS — E119 Type 2 diabetes mellitus without complications: Secondary | ICD-10-CM | POA: Diagnosis present

## 2014-12-31 DIAGNOSIS — F17210 Nicotine dependence, cigarettes, uncomplicated: Secondary | ICD-10-CM | POA: Diagnosis present

## 2014-12-31 DIAGNOSIS — J441 Chronic obstructive pulmonary disease with (acute) exacerbation: Secondary | ICD-10-CM | POA: Diagnosis present

## 2014-12-31 DIAGNOSIS — F0390 Unspecified dementia without behavioral disturbance: Secondary | ICD-10-CM | POA: Diagnosis present

## 2014-12-31 DIAGNOSIS — I255 Ischemic cardiomyopathy: Secondary | ICD-10-CM | POA: Diagnosis present

## 2014-12-31 DIAGNOSIS — I10 Essential (primary) hypertension: Secondary | ICD-10-CM | POA: Diagnosis present

## 2014-12-31 DIAGNOSIS — I2510 Atherosclerotic heart disease of native coronary artery without angina pectoris: Secondary | ICD-10-CM | POA: Diagnosis present

## 2014-12-31 DIAGNOSIS — Z66 Do not resuscitate: Secondary | ICD-10-CM | POA: Diagnosis present

## 2014-12-31 DIAGNOSIS — J449 Chronic obstructive pulmonary disease, unspecified: Secondary | ICD-10-CM | POA: Diagnosis present

## 2014-12-31 DIAGNOSIS — I5022 Chronic systolic (congestive) heart failure: Secondary | ICD-10-CM | POA: Diagnosis present

## 2014-12-31 DIAGNOSIS — Z794 Long term (current) use of insulin: Secondary | ICD-10-CM | POA: Diagnosis not present

## 2014-12-31 DIAGNOSIS — K219 Gastro-esophageal reflux disease without esophagitis: Secondary | ICD-10-CM | POA: Diagnosis present

## 2014-12-31 DIAGNOSIS — Y95 Nosocomial condition: Secondary | ICD-10-CM | POA: Diagnosis present

## 2014-12-31 DIAGNOSIS — M1990 Unspecified osteoarthritis, unspecified site: Secondary | ICD-10-CM | POA: Diagnosis present

## 2014-12-31 LAB — COMPREHENSIVE METABOLIC PANEL
ALT: 10 U/L (ref 0–35)
AST: 22 U/L (ref 0–37)
Albumin: 3.8 g/dL (ref 3.5–5.2)
Alkaline Phosphatase: 60 U/L (ref 39–117)
Anion gap: 11 (ref 5–15)
BILIRUBIN TOTAL: 1 mg/dL (ref 0.3–1.2)
BUN: 22 mg/dL (ref 6–23)
CALCIUM: 8.9 mg/dL (ref 8.4–10.5)
CO2: 28 mmol/L (ref 19–32)
Chloride: 102 mmol/L (ref 96–112)
Creatinine, Ser: 0.77 mg/dL (ref 0.50–1.10)
GFR calc Af Amer: >90 mL/min (ref >90)
GFR calc non Af Amer: 81 mL/min — ABNORMAL LOW (ref >90)
GLUCOSE: 123 mg/dL — AB (ref 70–99)
Potassium: 4.3 mmol/L (ref 3.5–5.1)
Sodium: 141 mmol/L (ref 135–145)
TOTAL PROTEIN: 6.7 g/dL (ref 6.0–8.3)

## 2014-12-31 LAB — CBC
HEMATOCRIT: 45.3 % (ref 36.0–46.0)
HEMOGLOBIN: 14.9 g/dL (ref 12.0–15.0)
MCH: 32.2 pg (ref 26.0–34.0)
MCHC: 32.9 g/dL (ref 30.0–36.0)
MCV: 97.8 fL (ref 78.0–100.0)
Platelets: 121 10*3/uL — ABNORMAL LOW (ref 150–400)
RBC: 4.63 MIL/uL (ref 3.87–5.11)
RDW: 12.8 % (ref 11.5–15.5)
WBC: 10.1 10*3/uL (ref 4.0–10.5)

## 2014-12-31 LAB — BASIC METABOLIC PANEL
Anion gap: 7 (ref 5–15)
BUN: 23 mg/dL (ref 6–23)
CALCIUM: 9.1 mg/dL (ref 8.4–10.5)
CO2: 30 mmol/L (ref 19–32)
Chloride: 103 mmol/L (ref 96–112)
Creatinine, Ser: 0.85 mg/dL (ref 0.50–1.10)
GFR, EST AFRICAN AMERICAN: 77 mL/min — AB (ref >90)
GFR, EST NON AFRICAN AMERICAN: 66 mL/min — AB (ref >90)
Glucose, Bld: 136 mg/dL — ABNORMAL HIGH (ref 70–99)
Potassium: 3.9 mmol/L (ref 3.5–5.1)
SODIUM: 140 mmol/L (ref 135–145)

## 2014-12-31 LAB — CBC WITH DIFFERENTIAL/PLATELET
Basophils Absolute: 0 10*3/uL (ref 0.0–0.1)
Basophils Relative: 0 % (ref 0–1)
Eosinophils Absolute: 0.1 10*3/uL (ref 0.0–0.7)
Eosinophils Relative: 1 % (ref 0–5)
HCT: 44.2 % (ref 36.0–46.0)
Hemoglobin: 14.6 g/dL (ref 12.0–15.0)
LYMPHS ABS: 3.3 10*3/uL (ref 0.7–4.0)
LYMPHS PCT: 36 % (ref 12–46)
MCH: 32.4 pg (ref 26.0–34.0)
MCHC: 33 g/dL (ref 30.0–36.0)
MCV: 98.2 fL (ref 78.0–100.0)
MONOS PCT: 6 % (ref 3–12)
Monocytes Absolute: 0.5 10*3/uL (ref 0.1–1.0)
Neutro Abs: 5.2 10*3/uL (ref 1.7–7.7)
Neutrophils Relative %: 57 % (ref 43–77)
Platelets: 110 10*3/uL — ABNORMAL LOW (ref 150–400)
RBC: 4.5 MIL/uL (ref 3.87–5.11)
RDW: 12.8 % (ref 11.5–15.5)
WBC: 9.1 10*3/uL (ref 4.0–10.5)

## 2014-12-31 LAB — I-STAT TROPONIN, ED: Troponin i, poc: 0.01 ng/mL (ref 0.00–0.08)

## 2014-12-31 LAB — STREP PNEUMONIAE URINARY ANTIGEN: Strep Pneumo Urinary Antigen: NEGATIVE

## 2014-12-31 LAB — BRAIN NATRIURETIC PEPTIDE: B NATRIURETIC PEPTIDE 5: 95.6 pg/mL (ref 0.0–100.0)

## 2014-12-31 MED ORDER — VANCOMYCIN HCL 500 MG IV SOLR
500.0000 mg | Freq: Two times a day (BID) | INTRAVENOUS | Status: DC
  Administered 2014-12-31 – 2015-01-01 (×4): 500 mg via INTRAVENOUS
  Filled 2014-12-31 (×4): qty 500

## 2014-12-31 MED ORDER — ASPIRIN EC 81 MG PO TBEC
81.0000 mg | DELAYED_RELEASE_TABLET | Freq: Every day | ORAL | Status: DC
  Administered 2014-12-31 – 2015-01-01 (×2): 81 mg via ORAL
  Filled 2014-12-31 (×2): qty 1

## 2014-12-31 MED ORDER — POLYETHYLENE GLYCOL 3350 17 G PO PACK: 17.0000 g | PACK | Freq: Every day | ORAL | Status: DC | PRN

## 2014-12-31 MED ORDER — SENNOSIDES-DOCUSATE SODIUM 8.6-50 MG PO TABS
2.0000 | ORAL_TABLET | Freq: Every day | ORAL | Status: DC
  Administered 2014-12-31: 2 via ORAL
  Filled 2014-12-31: qty 2

## 2014-12-31 MED ORDER — PRAVASTATIN SODIUM 40 MG PO TABS
40.0000 mg | ORAL_TABLET | Freq: Every day | ORAL | Status: DC
  Administered 2014-12-31 – 2015-01-01 (×2): 40 mg via ORAL
  Filled 2014-12-31 (×2): qty 1

## 2014-12-31 MED ORDER — MEMANTINE HCL ER 28 MG PO CP24
28.0000 mg | ORAL_CAPSULE | Freq: Every day | ORAL | Status: DC
  Administered 2014-12-31 – 2015-01-01 (×2): 28 mg via ORAL
  Filled 2014-12-31 (×4): qty 1

## 2014-12-31 MED ORDER — FESOTERODINE FUMARATE ER 4 MG PO TB24
4.0000 mg | ORAL_TABLET | Freq: Every day | ORAL | Status: DC
  Administered 2014-12-31 – 2015-01-01 (×2): 4 mg via ORAL
  Filled 2014-12-31 (×3): qty 1

## 2014-12-31 MED ORDER — LEVOFLOXACIN IN D5W 750 MG/150ML IV SOLN
750.0000 mg | INTRAVENOUS | Status: DC
  Administered 2015-01-01: 750 mg via INTRAVENOUS
  Filled 2014-12-31: qty 150

## 2014-12-31 MED ORDER — IPRATROPIUM BROMIDE 0.02 % IN SOLN
0.5000 mg | Freq: Four times a day (QID) | RESPIRATORY_TRACT | Status: DC
  Administered 2014-12-31 – 2015-01-01 (×6): 0.5 mg via RESPIRATORY_TRACT
  Filled 2014-12-31 (×7): qty 2.5

## 2014-12-31 MED ORDER — LATANOPROST 0.005 % OP SOLN
1.0000 [drp] | Freq: Every day | OPHTHALMIC | Status: DC
  Administered 2014-12-31: 1 [drp] via OPHTHALMIC
  Filled 2014-12-31: qty 2.5

## 2014-12-31 MED ORDER — METOPROLOL SUCCINATE ER 25 MG PO TB24
25.0000 mg | ORAL_TABLET | Freq: Every day | ORAL | Status: DC
  Administered 2014-12-31 – 2015-01-01 (×2): 25 mg via ORAL
  Filled 2014-12-31 (×2): qty 1

## 2014-12-31 MED ORDER — IOHEXOL 300 MG/ML  SOLN
80.0000 mL | Freq: Once | INTRAMUSCULAR | Status: AC | PRN
  Administered 2014-12-31: 80 mL via INTRAVENOUS

## 2014-12-31 MED ORDER — CLOPIDOGREL BISULFATE 75 MG PO TABS
75.0000 mg | ORAL_TABLET | Freq: Every day | ORAL | Status: DC
  Administered 2014-12-31 – 2015-01-01 (×2): 75 mg via ORAL
  Filled 2014-12-31 (×2): qty 1

## 2014-12-31 MED ORDER — NITROGLYCERIN 0.4 MG SL SUBL: 0.4000 mg | SUBLINGUAL_TABLET | SUBLINGUAL | Status: DC | PRN

## 2014-12-31 MED ORDER — NICOTINE 21 MG/24HR TD PT24
21.0000 mg | MEDICATED_PATCH | Freq: Every day | TRANSDERMAL | Status: DC
  Administered 2014-12-31: 21 mg via TRANSDERMAL
  Filled 2014-12-31 (×2): qty 1

## 2014-12-31 MED ORDER — LORAZEPAM 0.5 MG PO TABS: 0.5000 mg | ORAL_TABLET | Freq: Two times a day (BID) | ORAL | Status: DC | PRN

## 2014-12-31 MED ORDER — INSULIN GLARGINE 100 UNIT/ML ~~LOC~~ SOLN
8.0000 [IU] | Freq: Every day | SUBCUTANEOUS | Status: DC
  Administered 2014-12-31 – 2015-01-01 (×2): 8 [IU] via SUBCUTANEOUS
  Filled 2014-12-31 (×3): qty 0.08

## 2014-12-31 MED ORDER — VANCOMYCIN HCL IN DEXTROSE 1-5 GM/200ML-% IV SOLN
1000.0000 mg | Freq: Once | INTRAVENOUS | Status: DC
  Filled 2014-12-31: qty 200

## 2014-12-31 MED ORDER — PREGABALIN 75 MG PO CAPS
75.0000 mg | ORAL_CAPSULE | Freq: Two times a day (BID) | ORAL | Status: DC
  Administered 2014-12-31 – 2015-01-01 (×3): 75 mg via ORAL
  Filled 2014-12-31 (×3): qty 1

## 2014-12-31 MED ORDER — SODIUM CHLORIDE 0.9 % IV SOLN
INTRAVENOUS | Status: AC
  Administered 2014-12-31: 04:00:00 via INTRAVENOUS

## 2014-12-31 MED ORDER — HEPARIN SODIUM (PORCINE) 5000 UNIT/ML IJ SOLN
5000.0000 [IU] | Freq: Three times a day (TID) | INTRAMUSCULAR | Status: DC
  Administered 2014-12-31 – 2015-01-01 (×5): 5000 [IU] via SUBCUTANEOUS
  Filled 2014-12-31 (×6): qty 1

## 2014-12-31 MED ORDER — HYDROCOD POLST-CHLORPHEN POLST 10-8 MG/5ML PO LQCR
5.0000 mL | Freq: Two times a day (BID) | ORAL | Status: DC | PRN
  Administered 2014-12-31 – 2015-01-01 (×2): 5 mL via ORAL
  Filled 2014-12-31 (×2): qty 5

## 2014-12-31 MED ORDER — MOMETASONE FURO-FORMOTEROL FUM 100-5 MCG/ACT IN AERO
2.0000 | INHALATION_SPRAY | Freq: Two times a day (BID) | RESPIRATORY_TRACT | Status: DC
  Administered 2014-12-31 – 2015-01-01 (×3): 2 via RESPIRATORY_TRACT
  Filled 2014-12-31: qty 8.8

## 2014-12-31 MED ORDER — ALBUTEROL SULFATE (2.5 MG/3ML) 0.083% IN NEBU
2.5000 mg | INHALATION_SOLUTION | Freq: Four times a day (QID) | RESPIRATORY_TRACT | Status: DC | PRN
  Administered 2014-12-31: 2.5 mg via RESPIRATORY_TRACT
  Filled 2014-12-31: qty 3

## 2014-12-31 MED ORDER — ISOSORBIDE MONONITRATE ER 30 MG PO TB24
30.0000 mg | ORAL_TABLET | Freq: Every day | ORAL | Status: DC
Start: 2014-12-31 — End: 2015-01-01
  Administered 2014-12-31 – 2015-01-01 (×2): 30 mg via ORAL
  Filled 2014-12-31 (×2): qty 1

## 2014-12-31 MED ORDER — LEVOFLOXACIN IN D5W 750 MG/150ML IV SOLN
750.0000 mg | Freq: Once | INTRAVENOUS | Status: AC
  Administered 2014-12-31: 750 mg via INTRAVENOUS
  Filled 2014-12-31: qty 150

## 2014-12-31 MED ORDER — PAROXETINE HCL 10 MG PO TABS
10.0000 mg | ORAL_TABLET | Freq: Every day | ORAL | Status: DC
  Administered 2014-12-31 – 2015-01-01 (×2): 10 mg via ORAL
  Filled 2014-12-31 (×3): qty 1

## 2014-12-31 NOTE — Progress Notes (Addendum)
I have seen and examined [name] at bedside in the presence of her granddaughter and reviewed her chart. She was admitted earlier today by Dr. Blaine Hamper. Please refer to his comprehensive H&P and care plan for details. [name] is a 74 y.o. 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 However, her white count was normal at 10,100 without left shift. She also did not have fever. She is on antibiotics for pneumonia back concern is whether she may have underlying obstruction given long history of cigarette smoking. Will therefore obtain CT chest with contrast and continue antibiotics per Dr. Blaine Hamper.

## 2014-12-31 NOTE — Progress Notes (Signed)
ANTIBIOTIC CONSULT NOTE - INITIAL  Pharmacy Consult for Vancomycin/Adjust abx for renal fcn>Levaquin Indication: pneumonia  Allergies  Allergen Reactions  . Lisinopril     REACTION: respiratory arrest jan 2009  . Eggs Or Egg-Derived Products Diarrhea  . Erythromycin Other (See Comments)    Makes me feel weird   . Iodine     Pt may be allergic to iodine, does not remember the details, refuses contrast-CS  . Penicillins Itching and Other (See Comments)    Too much as a kid  . Pentazocine Lactate Other (See Comments)    Reaction unknown  . Propoxyphene Hcl Other (See Comments)    Reaction unknown  . Quinapril Hcl Itching and Other (See Comments)    Too much   . Spironolactone     PATIENT HAD SEVERE SIDE EFFECTS  . Sulfonamide Derivatives Other (See Comments)    Childhood allergy    Patient Measurements: Height: 5\' 5"  (165.1 cm) Weight: 155 lb 12.8 oz (70.67 kg) IBW/kg (Calculated) : 57   Vital Signs: Temp: 97.6 F (36.4 C) (02/11 0237) Temp Source: Oral (02/11 0237) BP: 163/84 mmHg (02/11 0237) Pulse Rate: 80 (02/11 0237) Intake/Output from previous day:   Intake/Output from this shift:    Labs:  Recent Labs  12/30/14 2353  WBC 10.1  HGB 14.9  PLT 121*  CREATININE 0.85   Estimated Creatinine Clearance: 58.2 mL/min (by C-G formula based on Cr of 0.85). No results for input(s): VANCOTROUGH, VANCOPEAK, VANCORANDOM, GENTTROUGH, GENTPEAK, GENTRANDOM, TOBRATROUGH, TOBRAPEAK, TOBRARND, AMIKACINPEAK, AMIKACINTROU, AMIKACIN in the last 72 hours.   Microbiology: No results found for this or any previous visit (from the past 720 hour(s)).  Medical History: Past Medical History  Diagnosis Date  . CAD (coronary artery disease)     a. s/p inferior STEMI 12/29/10 with rotablator atherectomy RCA 12/31/10 and DES x 2 RCA;  b. Lexiscan Myoview (11/2013):  Low risk study, no ischemia, inferolateral defect likely consistent with attenuation versus scar.  . Ischemic  cardiomyopathy     EF 45%cath 2012 EF55% 5/12; 35% 11/14  . Diabetes mellitus     type 2  . HTN (hypertension)   . Hyperlipidemia   . Diverticulosis   . Respiratory arrest//ACE Inhibitor presumed cause   . COPD (chronic obstructive pulmonary disease)   . Small bowel obstruction     from a sigmoid stricture  . Nephrolithiasis   . Sleep apnea   . Overweight(278.02)   . Osteoarthritis   . GERD (gastroesophageal reflux disease)   . Anxiety   . Glaucoma   . Memory deficit   . Dementia     Medications:  Scheduled:  . sodium chloride   Intravenous STAT  . aspirin EC  81 mg Oral Daily  . clopidogrel  75 mg Oral Daily  . fesoterodine  4 mg Oral Daily  . heparin  5,000 Units Subcutaneous 3 times per day  . insulin glargine  8 Units Subcutaneous Daily  . ipratropium  0.5 mg Nebulization QID  . isosorbide mononitrate  30 mg Oral Daily  . latanoprost  1 drop Both Eyes QHS  . levofloxacin (LEVAQUIN) IV  750 mg Intravenous Q24H  . memantine  28 mg Oral Daily  . metoprolol succinate  25 mg Oral Daily  . mometasone-formoterol  2 puff Inhalation BID  . nicotine  21 mg Transdermal Daily  . PARoxetine  10 mg Oral Daily  . pravastatin  40 mg Oral Daily  . pregabalin  75 mg Oral BID  .  senna-docusate  2 tablet Oral QHS  . vancomycin  500 mg Intravenous Q12H   Infusions:   Assessment: 36 yoF admitted with cough and SOB.  Levaquin and Vancomycin per Rx for PNA.   Goal of Therapy:  Vancomycin trough level 15-20 mcg/ml  Plan:   Vancomycin 500mg  IV q12h  Levaquin 750mg  IV q24h  F/u SCr/levels/cultures as needed  Dorrene German 12/31/2014,3:29 AM

## 2014-12-31 NOTE — H&P (Signed)
[facility] History and Physical  [name] IDP:[id number] DOB: June 30, 1941 DOA: 12/30/2014  Referring physician: ED physician PCP: Vidal Schwalbe, MD  Specialists:   Chief Complaint: Cough and shortness of breath  HPI: [name] is a 74 y.o. female with past medical history hypertension, hyperlipidemia, GERD, diabetes mellitus, COPD, dementia, systolic congestive heart failure, who presents with cough and shortness of breath.  Patient reports that since yesterday, she started having cough and shortness of breath. She has a chest tightness, but no chest pain. She does not have a fever or chills. She coughs up some clear mucus. Patient reports that she has chronic diarrhea and mild right lower quadrant abdominal pain, which has been going on for more than a year, no changes recently. Patient denies fever, chills, headaches, dysuria, urgency, frequency, hematuria, skin rashes. No unilateral weakness, numbness or tingling sensations. No vision change or hearing loss.  In ED, patient was found to have pneumonia over left base. WBC 10.1 negative troponin. BNP 95.8. Electrolytes normal. Temperature normal. Patient is admitted to inpatient for further evaluation and treatment.  Review of Systems: As presented in the history of presenting illness, rest negative.  Where does patient live?  At home Can patient participate in ADLs? none  Allergy:  Allergies  Allergen Reactions  . Lisinopril     REACTION: respiratory arrest jan 2009  . Eggs Or Egg-Derived Products Diarrhea  . Erythromycin Other (See Comments)    Makes me feel weird   . Iodine     Pt may be allergic to iodine, does not remember the details, refuses contrast-CS  . Penicillins Itching and Other (See Comments)    Too much as a kid  . Pentazocine Lactate Other (See Comments)    Reaction unknown  . Propoxyphene Hcl Other (See Comments)    Reaction unknown  . Quinapril Hcl Itching and Other (See Comments)    Too  much   . Spironolactone     PATIENT HAD SEVERE SIDE EFFECTS  . Sulfonamide Derivatives Other (See Comments)    Childhood allergy    Past Medical History  Diagnosis Date  . CAD (coronary artery disease)     a. s/p inferior STEMI 12/29/10 with rotablator atherectomy RCA 12/31/10 and DES x 2 RCA;  b. Lexiscan Myoview (11/2013):  Low risk study, no ischemia, inferolateral defect likely consistent with attenuation versus scar.  . Ischemic cardiomyopathy     EF 45%cath 2012 EF55% 5/12; 35% 11/14  . Diabetes mellitus     type 2  . HTN (hypertension)   . Hyperlipidemia   . Diverticulosis   . Respiratory arrest//ACE Inhibitor presumed cause   . COPD (chronic obstructive pulmonary disease)   . Small bowel obstruction     from a sigmoid stricture  . Nephrolithiasis   . Sleep apnea   . Overweight(278.02)   . Osteoarthritis   . GERD (gastroesophageal reflux disease)   . Anxiety   . Glaucoma   . Memory deficit   . Dementia     Past Surgical History  Procedure Laterality Date  . Bilateral tubal ligation    . Lithotripsy    . 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 Cardiac surgery      Social History:  reports that she has been smoking Cigarettes.  She has a 25 pack-year smoking history. She has never used smokeless tobacco. She reports that she does not drink alcohol or use illicit drugs.  Family History:  Family  History  Problem Relation Age of Onset  . Breast cancer Maternal Grandmother   . Diabetes Father   . Heart disease Father   . Diabetes Sister   . Colon cancer Neg Hx      Prior to Admission medications   Medication Sig Start Date End Date Taking? Authorizing Provider  aspirin EC 81 MG tablet Take 81 mg by mouth daily.   Yes Historical Provider, MD  clopidogrel (PLAVIX) 75 MG tablet TAKE 1 TABLET BY MOUTH EVERY DAY 11/11/14  Yes Burnell Blanks, MD  insulin glargine (LANTUS) 100 UNIT/ML injection Inject 0.1 mLs (10 Units total)  into the skin daily. 01/25/14  Yes Hosie Poisson, MD  isosorbide mononitrate (IMDUR) 30 MG 24 hr tablet Take 1 tablet (30 mg total) by mouth daily. 10/14/13  Yes Donita Brooks, NP  Linagliptin-Metformin HCl 2.03-999 MG TABS Take 1 tablet by mouth 2 (two) times daily.   Yes Historical Provider, MD  metoprolol succinate (TOPROL-XL) 25 MG 24 hr tablet Take 1 tablet (25 mg total) by mouth daily. 10/14/13  Yes Donita Brooks, NP  PARoxetine (PAXIL) 10 MG tablet Take 10 mg by mouth daily.   Yes Historical Provider, MD  pravastatin (PRAVACHOL) 40 MG tablet TAKE 1 TABLET (40 MG TOTAL) BY MOUTH DAILY. <PLEASE MAKE APPOINTMENT FOR REFILLS> 12/25/14  Yes Burnell Blanks, MD  pregabalin (LYRICA) 75 MG capsule Take 75 mg by mouth 2 (two) times daily.     Yes Historical Provider, MD  tolterodine (DETROL LA) 4 MG 24 hr capsule Take 4 mg by mouth every morning.    Yes Historical Provider, MD  albuterol (PROVENTIL HFA;VENTOLIN HFA) 108 (90 BASE) MCG/ACT inhaler Inhale 2 puffs into the lungs every 6 (six) hours as needed for wheezing.    Historical Provider, MD  albuterol (PROVENTIL) (2.5 MG/3ML) 0.083% nebulizer solution Take 2.5 mg by nebulization every 6 (six) hours as needed for wheezing or shortness of breath.     Historical Provider, MD  furosemide (LASIX) 20 MG tablet Take 1 tablet (20 mg total) by mouth as needed. 10/28/13   Liliane Shi, PA-C  insulin lispro (HUMALOG) 100 UNIT/ML injection Inject 0-10 Units into the skin 3 (three) times daily before meals. Sliding scale: below 150=0 units, 150-200=2 units, 201-250=4 units, 251-300=6 units, 301-350=8 units, above 350=10 units    Historical Provider, MD  ipratropium (ATROVENT) 0.02 % nebulizer solution Take 0.5 mg by nebulization 4 (four) times daily. Four times daily 10/30/13   [name] S Parrett, NP  LORazepam (ATIVAN) 0.5 MG tablet Take 0.5 mg by mouth 2 (two) times daily as needed for anxiety.    Historical Provider, MD  Memantine HCl ER 28 MG CP24 Take 28  mg by mouth daily. 08/13/14   Ward Givens, NP  metFORMIN (GLUCOPHAGE) 500 MG tablet Take 500 mg by mouth daily before supper. Taking 4 pills at dinner    Historical Provider, MD  mometasone-formoterol (DULERA) 100-5 MCG/ACT AERO Inhale 2 puffs into the lungs 2 (two) times daily.    Historical Provider, MD  nitroGLYCERIN (NITROSTAT) 0.4 MG SL tablet Place 1 tablet (0.4 mg total) under the tongue every 5 (five) minutes as needed. For chest pain. 06/18/13   Burnell Blanks, MD  polyethylene glycol (MIRALAX / Floria Raveling) packet Take 17 g by mouth daily as needed (for constipation).    Historical Provider, MD  senna-docusate (SENOKOT-S) 8.6-50 MG per tablet Take 2 tablets by mouth at bedtime. Hold for diarrhea 10/14/13   Velna Hatchet  L Ollis, NP  Travoprost, BAK Free, (TRAVATAN) 0.004 % SOLN ophthalmic solution Place 1 drop into both eyes daily.    Historical Provider, MD    Physical Exam: Filed Vitals:   12/30/14 2325 12/30/14 2349 12/31/14 0055 12/31/14 0207  BP: 178/82  160/77 157/68  Pulse: 88  69 69  Temp: 98.3 F (36.8 C)     TempSrc: Oral     Resp: 22  19 19   Height: 5\' 5"  (1.651 m)     Weight: 70.67 kg (155 lb 12.8 oz)     SpO2: 93% 91% 96% 96%   General: Not in acute distress HEENT:       Eyes: PERRL, EOMI, no scleral icterus       ENT: No discharge from the ears and nose, no pharynx injection, no tonsillar enlargement.        Neck: No JVD, no bruit, no mass felt. Cardiac: S1/S2, RRR, No murmurs, No gallops or rubs Pulm: diffused rhonchi and rales. Abd: Soft, nondistended, mild tenderness over right LLQ, no rebound pain, no organomegaly, BS present Ext: No edema bilaterally. 2+DP/PT pulse bilaterally Musculoskeletal: No joint deformities, erythema, or stiffness, ROM full Skin: No rashes.  Neuro: Alert and oriented X3, cranial nerves II-XII grossly intact, muscle strength 5/5 in all extremeties, sensation to light touch intact. Brachial reflex 1+ bilaterally. Knee reflex 1+  bilaterally. Negative Babinski's sign. Psych: Patient is not psychotic, no suicidal or hemocidal ideation.  Labs on Admission:  Basic Metabolic Panel:  Recent Labs Lab 12/30/14 2353  NA 140  K 3.9  CL 103  CO2 30  GLUCOSE 136*  BUN 23  CREATININE 0.85  CALCIUM 9.1   Liver Function Tests: No results for input(s): AST, ALT, ALKPHOS, BILITOT, PROT, ALBUMIN in the last 168 hours. No results for input(s): LIPASE, AMYLASE in the last 168 hours. No results for input(s): AMMONIA in the last 168 hours. CBC:  Recent Labs Lab 12/30/14 2353  WBC 10.1  HGB 14.9  HCT 45.3  MCV 97.8  PLT 121*   Cardiac Enzymes: No results for input(s): CKTOTAL, CKMB, CKMBINDEX, TROPONINI in the last 168 hours.  BNP (last 3 results)  Recent Labs  12/30/14 2354  BNP 95.6    ProBNP (last 3 results)  Recent Labs  01/23/14 1424  PROBNP 279.5*    CBG: No results for input(s): GLUCAP in the last 168 hours.  Radiological Exams on Admission: Dg Chest 2 View (if Patient Has Fever And/or Copd)  12/31/2014   CLINICAL DATA:  Pneumonia yesterday and now has shortness of Breath again. Coughing.  EXAM: CHEST  2 VIEW  COMPARISON:  01/23/2014  FINDINGS: Normal heart size and pulmonary vascularity. Infiltration or atelectasis in the left lung base anteriorly compatible with pneumonia. Probable small left pleural effusion. Emphysematous changes in the lungs. No pneumothorax. Calcified and tortuous aorta.  IMPRESSION: A infiltration in the left lung base anteriorly with small left pleural effusion suggesting pneumonia.   Electronically Signed   By: Lucienne Capers M.D.   On: 12/31/2014 00:48    EKG: Independently reviewed.   Assessment/Plan Principal Problem:   CAP (community acquired pneumonia) Active Problems:   DM2 (diabetes mellitus, type 2)   Essential hypertension, benign   Dyslipidemia   Coronary atherosclerosis of native coronary artery   Chronic systolic heart failure   HLD  (hyperlipidemia)   COPD (chronic obstructive pulmonary disease)  CAP: Patient has mild PNA. She is not septic. Hemodynamically stable. - Will admit to Telemetry Bed  given hx of sCHF - IV Levaquin - Urine legionella and S. pneumococcal antigen - IVF: 50 cc/h - continue home breathing treatment: Albuterol nebulizer, Atrovent nebulizer, Dulera,  - Follow up blood culture x2, sputum culture and respiratory virus panel   COPD: Patient may have mild exacerbation given diffused rhonchi solid auscultation. -continue home breathing treatment as above -On IV Levaquin  CHF: 2-D echo on 10/10/13 showed EF of 30-50% with grade 1 diastolic dysfunction. Pt is on low-dose Lasix 20 mg when necessary at home. She is euvolemic on admission. BNP 95.6 -hold lasix   DM-II: A1c was 6.0 on 01/23/14. Patient is on Lantus 10 units daily, and oral medications, including metformin and Linaglipitin -Decrease Lantus to 8 units daily -Sliding scale insulin  CAD: no chest pain -Continue aspirin, Plavix, pravastatin, Imdur, prn NTG  Dementia:  -continue memantine   DVT ppx: SQ Heparin          Code Status: DNR Family Communication:   Yes, patient's    niece   at bed side Disposition Plan: Admit to inpatient   Date of Service 12/31/2014    Ivor Costa [facility] Pager [phone]  If 7PM-7AM, please contact night-coverage [email or web address] Password [email or web address] 12/31/2014, 2:45 AM

## 2014-12-31 NOTE — ED Notes (Signed)
Patient transported to X-ray 

## 2014-12-31 NOTE — Progress Notes (Addendum)
PT Cancellation Note  Patient Details Name: [name] MRN: [id number] DOB: December 03, 1940   Cancelled Treatment:    Reason Eval/Treat Not Completed: Medical issues which prohibited therapy--pt on strict bedrest in order set. please update activity level status for participation with PT. Thanks. Will check back another day.    Weston Anna, MPT Pager: [phone]

## 2015-01-01 DIAGNOSIS — J9811 Atelectasis: Secondary | ICD-10-CM

## 2015-01-01 LAB — CBC WITH DIFFERENTIAL/PLATELET
Basophils Absolute: 0 10*3/uL (ref 0.0–0.1)
Basophils Relative: 0 % (ref 0–1)
Eosinophils Absolute: 0.1 10*3/uL (ref 0.0–0.7)
Eosinophils Relative: 2 % (ref 0–5)
HEMATOCRIT: 47.3 % — AB (ref 36.0–46.0)
Hemoglobin: 15.6 g/dL — ABNORMAL HIGH (ref 12.0–15.0)
LYMPHS ABS: 2 10*3/uL (ref 0.7–4.0)
Lymphocytes Relative: 30 % (ref 12–46)
MCH: 32.1 pg (ref 26.0–34.0)
MCHC: 33 g/dL (ref 30.0–36.0)
MCV: 97.3 fL (ref 78.0–100.0)
MONO ABS: 0.4 10*3/uL (ref 0.1–1.0)
MONOS PCT: 6 % (ref 3–12)
NEUTROS ABS: 4.3 10*3/uL (ref 1.7–7.7)
Neutrophils Relative %: 62 % (ref 43–77)
Platelets: ADEQUATE 10*3/uL (ref 150–400)
RBC: 4.86 MIL/uL (ref 3.87–5.11)
RDW: 12.8 % (ref 11.5–15.5)
WBC: 6.8 10*3/uL (ref 4.0–10.5)

## 2015-01-01 LAB — COMPREHENSIVE METABOLIC PANEL
ALT: 11 U/L (ref 0–35)
AST: 16 U/L (ref 0–37)
Albumin: 3.7 g/dL (ref 3.5–5.2)
Alkaline Phosphatase: 58 U/L (ref 39–117)
Anion gap: 7 (ref 5–15)
BILIRUBIN TOTAL: 0.9 mg/dL (ref 0.3–1.2)
BUN: 20 mg/dL (ref 6–23)
CHLORIDE: 99 mmol/L (ref 96–112)
CO2: 29 mmol/L (ref 19–32)
Calcium: 8.8 mg/dL (ref 8.4–10.5)
Creatinine, Ser: 0.71 mg/dL (ref 0.50–1.10)
GFR calc Af Amer: >90 mL/min (ref >90)
GFR, EST NON AFRICAN AMERICAN: 84 mL/min — AB (ref >90)
Glucose, Bld: 134 mg/dL — ABNORMAL HIGH (ref 70–99)
Potassium: 3.8 mmol/L (ref 3.5–5.1)
SODIUM: 135 mmol/L (ref 135–145)
Total Protein: 6.8 g/dL (ref 6.0–8.3)

## 2015-01-01 LAB — LEGIONELLA ANTIGEN, URINE

## 2015-01-01 MED ORDER — HYDROCOD POLST-CHLORPHEN POLST 10-8 MG/5ML PO LQCR: 5.0000 mL | Freq: Two times a day (BID) | ORAL | Status: DC | PRN

## 2015-01-01 MED ORDER — LEVOFLOXACIN 500 MG PO TABS: 500.0000 mg | ORAL_TABLET | Freq: Every day | ORAL | Status: DC

## 2015-01-01 NOTE — Progress Notes (Signed)
Spoke with pt concerning Home Health. Pt declined [facility] at present time. Pt states, "I have someone coming in during the week to check on me. My niece lives with me, I am never alone."

## 2015-01-01 NOTE — Discharge Summary (Addendum)
[name], is a 74 y.o. female  DOB 11-27-1940  MRN [id number].  Admission date:  12/30/2014  Admitting Physician  Ivor Costa, MD  Discharge Date:  01/01/2015   Primary MD  Vidal Schwalbe, MD  Recommendations for primary care physician for things to follow:  Please refer to cardiology for "fat pad".   Admission Diagnosis   HCAP (healthcare-associated pneumonia) [J18.9]   Discharge Diagnosis  HCAP (healthcare-associated pneumonia) [J18.9]   Principal Problem:   Atelectasis Active Problems:   DM2 (diabetes mellitus, type 2)   Essential hypertension, benign   Dyslipidemia   Coronary atherosclerosis of native coronary artery   Chronic systolic heart failure   HLD (hyperlipidemia)   COPD (chronic obstructive pulmonary disease)      Hospital Course  [name] is a pleasant 74 y.o. 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 However, her white count was normal at 10,100 without left shift. She also did not have fever. She was started on antibiotics for pneumonia but concern was whether she may have underlying obstruction given long history of cigarette smoking. She therefore had CT chest with contrast which showed "There is cardiac enlargement with mild left lower lobe atelectasis. There is no evidence of pneumonia. Density seen on the recent chest radiograph appears to represent a combination of predominantly left-sided cardiac enlargement, mild left lower lobe atelectasis, and a left epicardial fat pad".  Likely, patient had acute exacerbation of COPD. Will therefore discharge her on Levaquin. I encouraged her to use incentive spirometer which she has at home. She will get help from her niece who is a Marine scientist. She should follow with her PCP and cardiologist in the next 1-2 weeks. Encourage smoking cessation.   Discharge Condition Stable.  Consults obtained  None  Follow UP     Discharge Instructions  and  Discharge Medications  Discharge Instructions    Diet - low sodium heart healthy    Complete by:  As directed      Diet Carb Modified    Complete by:  As directed      Increase activity slowly    Complete by:  As directed             Medication List    TAKE these medications        albuterol (2.5 MG/3ML) 0.083% nebulizer solution  Commonly known as:  PROVENTIL  Take 2.5 mg by nebulization every 6 (six) hours as needed for wheezing or shortness of breath.     albuterol 108 (90 BASE) MCG/ACT inhaler  Commonly known as:  PROVENTIL HFA;VENTOLIN HFA  Inhale 2 puffs into the lungs every 6 (six) hours as needed for wheezing.     aspirin EC 81 MG tablet  Take 81 mg by mouth daily.  chlorpheniramine-HYDROcodone 10-8 MG/5ML Lqcr  Commonly known as:  TUSSIONEX  Take 5 mLs by mouth every 12 (twelve) hours as needed for cough.     clopidogrel 75 MG tablet  Commonly known as:  PLAVIX  TAKE 1 TABLET BY MOUTH EVERY DAY     furosemide 20 MG tablet  Commonly known as:  LASIX  Take 1 tablet (20 mg total) by mouth as needed.     insulin glargine 100 UNIT/ML injection  Commonly known as:  LANTUS  Inject 0.1 mLs (10 Units total) into the skin daily.     insulin lispro 100 UNIT/ML injection  Commonly known as:  HUMALOG  Inject 0-10 Units into the skin 3 (three) times daily before meals. Sliding scale: below 150=0 units, 150-200=2 units, 201-250=4 units, 251-300=6 units, 301-350=8 units, above 350=10 units     ipratropium 0.02 % nebulizer solution  Commonly known as:  ATROVENT    Take 0.5 mg by nebulization 4 (four) times daily. Four times daily     isosorbide mononitrate 30 MG 24 hr tablet  Commonly known as:  IMDUR  Take 1 tablet (30 mg total) by mouth daily.     levofloxacin 500 MG tablet  Commonly known as:  LEVAQUIN  Take 1 tablet (500 mg total) by mouth daily.     LORazepam 0.5 MG tablet  Commonly known as:  ATIVAN  Take 0.5 mg by mouth 2 (two) times daily as needed for anxiety.     memantine 28 MG Cp24 24 hr capsule  Commonly known as:  NAMENDA XR  Take 28 mg by mouth daily.     metFORMIN 500 MG tablet  Commonly known as:  GLUCOPHAGE  Take 500 mg by mouth daily before supper. Taking 4 pills at dinner     metoprolol succinate 25 MG 24 hr tablet  Commonly known as:  TOPROL-XL  Take 1 tablet (25 mg total) by mouth daily.     mometasone-formoterol 100-5 MCG/ACT Aero  Commonly known as:  DULERA  Inhale 2 puffs into the lungs 2 (two) times daily.     nitroGLYCERIN 0.4 MG SL tablet  Commonly known as:  NITROSTAT  Place 1 tablet (0.4 mg total) under the tongue every 5 (five) minutes as needed. For chest pain.     PARoxetine 10 MG tablet  Commonly known as:  PAXIL  Take 10 mg by mouth daily.     polyethylene glycol packet  Commonly known as:  MIRALAX / GLYCOLAX  Take 17 g by mouth daily as needed (for constipation).     pravastatin 40 MG tablet  Commonly known as:  PRAVACHOL  TAKE 1 TABLET (40 MG TOTAL) BY MOUTH DAILY. <PLEASE MAKE APPOINTMENT FOR REFILLS>     pregabalin 75 MG capsule  Commonly known as:  LYRICA  Take 75 mg by mouth 2 (two) times daily.     senna-docusate 8.6-50 MG per tablet  Commonly known as:  Senokot-S  Take 2 tablets by mouth at bedtime. Hold for diarrhea     Travoprost (BAK Free) 0.004 % Soln ophthalmic solution  Commonly known as:  TRAVATAN  Place 1 drop into both eyes daily.        Diet and Activity recommendation: See Discharge Instructions above  Major procedures and Radiology Reports - PLEASE review  detailed and final reports for all details, in brief -    Dg Chest 2 View (if Patient Has Fever And/or Copd)  12/31/2014   CLINICAL DATA:  Pneumonia yesterday and now  has shortness of Breath again. Coughing.  EXAM: CHEST  2 VIEW  COMPARISON:  01/23/2014  FINDINGS: Normal heart size and pulmonary vascularity. Infiltration or atelectasis in the left lung base anteriorly compatible with pneumonia. Probable small left pleural effusion. Emphysematous changes in the lungs. No pneumothorax. Calcified and tortuous aorta.  IMPRESSION: A infiltration in the left lung base anteriorly with small left pleural effusion suggesting pneumonia.   Electronically Signed   By: Lucienne Capers M.D.   On: 12/31/2014 00:48   Ct Chest W Contrast  12/31/2014   CLINICAL DATA:  Subsequent evaluation for cough pneumonia and shortness of breath  EXAM: CT CHEST WITH CONTRAST  TECHNIQUE: Multidetector CT imaging of the chest was performed during intravenous contrast administration.  CONTRAST:  30mL OMNIPAQUE IOHEXOL 300 MG/ML  SOLN  COMPARISON:  12/31/2014  FINDINGS: There is mild to moderate cardiac enlargement. There is very mild pericardial thickening. Patient is status post CABG and there is coronary artery calcification.  There is calcification of the thoracic aorta and of the visualized portion of the abdominal aorta. The origins of the great vessels extending into the neck are also calcified. There is no significant hilar or mediastinal adenopathy. There is no pleural effusion.  There is no evidence of infiltrate or edema. There is mild atelectasis in the inferior anterior aspect of the left lower lobe. There are no acute musculoskeletal findings. Images through the upper abdomen demonstrate no acute abnormalities. There is an approximately 5 mm apparently nonobstructing stone in the midpole the left kidney which is partially visualized.  IMPRESSION: There is cardiac enlargement with mild left lower lobe atelectasis. There is no  evidence of pneumonia. Density seen on the recent chest radiograph appears to represent a combination of predominantly left-sided cardiac enlargement, mild left lower lobe atelectasis, and a left epicardial fat pad.   Electronically Signed   By: Skipper Cliche M.D.   On: 12/31/2014 16:08    Micro Results   Recent Results (from the past 240 hour(s))  Culture, blood (routine x 2) Call MD if unable to obtain prior to antibiotics being given     Status: None (Preliminary result)   Collection Time: 12/31/14  2:30 AM  Result Value Ref Range Status   Specimen Description BLOOD RIGHT HAND  Final   Special Requests BOTTLES DRAWN AEROBIC ONLY 5CC  Final   Culture   Final           BLOOD CULTURE RECEIVED NO GROWTH TO DATE CULTURE WILL BE HELD FOR 5 DAYS BEFORE ISSUING A FINAL NEGATIVE REPORT Performed at [facility]    Report Status PENDING  Incomplete  Culture, blood (routine x 2) Call MD if unable to obtain prior to antibiotics being given     Status: None (Preliminary result)   Collection Time: 12/31/14  2:35 AM  Result Value Ref Range Status   Specimen Description BLOOD LEFT ARM  Final   Special Requests BOTTLES DRAWN AEROBIC ONLY 3CC  Final   Culture   Final           BLOOD CULTURE RECEIVED NO GROWTH TO DATE CULTURE WILL BE HELD FOR 5 DAYS BEFORE ISSUING A FINAL NEGATIVE REPORT Performed at [facility]    Report Status PENDING  Incomplete       Today   Subjective:   Verlene Mayer today has no headache,no chest abdominal pain,no new weakness tingling or numbness, feels much better wants to go home today.   Objective:   Blood  pressure 136/68, pulse 84, temperature 98 F (36.7 C), temperature source Oral, resp. rate 18, height 5\' 5"  (1.651 m), weight 70.67 kg (155 lb 12.8 oz), SpO2 95 %.   Intake/Output Summary (Last 24 hours) at 01/01/15 1732 Last data filed at 01/01/15 1300  Gross per 24 hour  Intake    730 ml  Output    100 ml  Net    630 ml     Exam Awake Alert, Oriented x 3, No new F.N deficits, Normal affect [place].AT,PERRAL Supple Neck,No JVD, No cervical lymphadenopathy appriciated.  Symmetrical Chest wall movement, Good air movement bilaterally, CTAB RRR,No Gallops,Rubs or new Murmurs, No Parasternal Heave +ve B.Sounds, Abd Soft, Non tender, No organomegaly appriciated, No rebound -guarding or rigidity. No Cyanosis, Clubbing or edema, No new Rash or bruise  Data Review   CBC w Diff: Lab Results  Component Value Date   WBC 6.8 01/01/2015   HGB 15.6* 01/01/2015   HCT 47.3* 01/01/2015   PLT  01/01/2015    PLATELET CLUMPS NOTED ON SMEAR, COUNT APPEARS ADEQUATE   LYMPHOPCT 30 01/01/2015   MONOPCT 6 01/01/2015   EOSPCT 2 01/01/2015   BASOPCT 0 01/01/2015    CMP: Lab Results  Component Value Date   NA 135 01/01/2015   K 3.8 01/01/2015   CL 99 01/01/2015   CO2 29 01/01/2015   BUN 20 01/01/2015   CREATININE 0.71 01/01/2015   PROT 6.8 01/01/2015   ALBUMIN 3.7 01/01/2015   BILITOT 0.9 01/01/2015   ALKPHOS 58 01/01/2015   AST 16 01/01/2015   ALT 11 01/01/2015  .   Total Time in preparing paper work, data evaluation and todays exam - 25 minutes  Kayode Petion M.D on 01/01/2015 at 5:32 PM  [facility] Office  [phone]

## 2015-01-01 NOTE — Evaluation (Signed)
Physical Therapy Evaluation Patient Details Name: KIMORA STANKOVIC MRN: [id number] DOB: 1941/05/01 Today's Date: 01/01/2015   History of Present Illness  74 yo female admitted Pna. Hx of DM, CAD, CHF, HTN, dementia. Pt lives alone.   Clinical Impression  On eval, pt required Min assist for mobility-able to walk to and from bathroom with walker. Pt is generally weak. Fatigues with minimal activity. At this time, may need to consider SNF placement. Pt lives alone per pt report. Will follow.     Follow Up Recommendations Home health PT;SNF (depending on progress)    Equipment Recommendations   (need to confirm whether pt has DME or not)    Recommendations for Other Services OT consult     Precautions / Restrictions Precautions Precautions: Fall Restrictions Weight Bearing Restrictions: No      Mobility  Bed Mobility Overal bed mobility: Needs Assistance Bed Mobility: Supine to Sit;Sit to Supine     Supine to sit: Supervision Sit to supine: Supervision      Transfers Overall transfer level: Needs assistance Equipment used: Rolling walker (2 wheeled) Transfers: Sit to/from Stand Sit to Stand: Min assist         General transfer comment: Assist to rise, steady and control descent to low surface (toilet).   Ambulation/Gait Ambulation/Gait assistance: Min assist Ambulation Distance (Feet): 15 Feet (x2) Assistive device: Rolling walker (2 wheeled) Gait Pattern/deviations: Step-through pattern;Trunk flexed     General Gait Details: slow gait speed. Pt fatigues easily. Walked to and from bathroom with walker.   Stairs            Wheelchair Mobility    Modified Rankin (Stroke Patients Only)       Balance Overall balance assessment: Needs assistance         Standing balance support: Bilateral upper extremity supported;During functional activity Standing balance-Leahy Scale: Fair                               Pertinent Vitals/Pain Pain  Assessment: No/denies pain    Home Living Family/patient expects to be discharged to:: Private residence Living Arrangements: Alone   Type of Home: House Home Access: Stairs to enter   [employer] of Steps: 1+1 Home Layout: Multi-level;Able to live on main level with bedroom/bathroom   Additional Comments: unsure of DME-pt stated she didn't have any    Prior Function Level of Independence: Independent         Comments: per pt, independent:  will need to verify     Hand Dominance        Extremity/Trunk Assessment   Upper Extremity Assessment: Defer to OT evaluation           Lower Extremity Assessment: Generalized weakness      Cervical / Trunk Assessment: Kyphotic  Communication   Communication: No difficulties  Cognition Arousal/Alertness: Awake/alert Behavior During Therapy: WFL for tasks assessed/performed Overall Cognitive Status: Within Functional Limits for tasks assessed                      General Comments      Exercises        Assessment/Plan    PT Assessment Patient needs continued PT services  PT Diagnosis Generalized weakness;Difficulty walking   PT Problem List Decreased strength;Decreased activity tolerance;Decreased mobility;Decreased balance;Decreased knowledge of use of DME;Decreased cognition  PT Treatment Interventions DME instruction;Gait training;Functional mobility training;Therapeutic activities;Therapeutic exercise;Patient/family education;Balance training   PT  Goals (Current goals can be found in the Care Plan section) Acute Rehab PT Goals Patient Stated Goal: to feel better PT Goal Formulation: With patient Time For Goal Achievement: 01/15/15 Potential to Achieve Goals: Good    Frequency Min 3X/week   Barriers to discharge        Co-evaluation               End of Session   Activity Tolerance: Patient limited by fatigue Patient left: in bed;with call bell/phone within reach            Time: 0940-1003 PT Time Calculation (min) (ACUTE ONLY): 23 min   Charges:   PT Evaluation $Initial PT Evaluation Tier I: 1 Procedure PT Treatments $Gait Training: 8-22 mins   PT G Codes:        Weston Anna, MPT Pager: [phone]

## 2015-01-01 NOTE — Progress Notes (Signed)
OT Cancellation Note  Patient Details Name: [name] MRN: [id number] DOB: 1941/04/06   Cancelled Treatment:     Reason Eval not complete/Pt not seen: Orders received for acute OT and chart reviewed, pt has Strict Bedrest Orders in chart, will need updated activity orders prior to evaluating.   Carlynn Herald Panayiota Larkin Beth Dixon, OTR/L 01/01/2015, 7:37 AM

## 2015-01-01 NOTE — Progress Notes (Signed)
CSW reviewed PT evaluation recommending Home Health vs. SNF at discharge. CSW spoke with patient to confirm that she plans to return home at discharge - declined SNF. Patient states that she has a daughter, Benjamine Mola that lives locally as well as a niece, Romie Jumper and next door neighbor that could help her at home. RNCM, Cookie aware.   No further CSW needs identified - CSW signing off.   Raynaldo Opitz, [facility] Clinical Social Worker cell #: [phone]

## 2015-01-05 ENCOUNTER — Ambulatory Visit (INDEPENDENT_AMBULATORY_CARE_PROVIDER_SITE_OTHER): Payer: Medicare Other | Admitting: Adult Health

## 2015-01-05 ENCOUNTER — Encounter: Payer: Self-pay | Admitting: Adult Health

## 2015-01-05 VITALS — BP 152/75 | HR 75 | Ht 64.0 in | Wt 150.0 lb

## 2015-01-05 DIAGNOSIS — R413 Other amnesia: Secondary | ICD-10-CM
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
## 2015-01-05 NOTE — Progress Notes (Signed)
I have read the note, and I agree with the clinical assessment and plan.  [name]   

## 2015-01-05 NOTE — Patient Instructions (Signed)
Overall memory has remained stable. Continue Namenda.

## 2015-01-06 LAB — CULTURE, BLOOD (ROUTINE X 2)
Culture: NO GROWTH
Culture: NO GROWTH

## 2015-01-07 LAB — RESPIRATORY VIRUS PANEL
ADENOVIRUS: NEGATIVE
INFLUENZA A: NEGATIVE
INFLUENZA B 1: NEGATIVE
Metapneumovirus: NEGATIVE
PARAINFLUENZA 1 A: NEGATIVE
Parainfluenza 2: NEGATIVE
Parainfluenza 3: NEGATIVE
Respiratory Syncytial Virus A: NEGATIVE
Respiratory Syncytial Virus B: NEGATIVE
Rhinovirus: NEGATIVE

## 2015-02-07 ENCOUNTER — Other Ambulatory Visit: Payer: Self-pay | Admitting: Adult Health

## 2015-03-08 IMAGING — CR DG CHEST 2V
2 series · 2 of 2 positions shown · non-contrast
Comparison: Radiographs 07/22/2013. CT 07/31/2013.

CLINICAL DATA: Cough and congestion.

EXAM:
CHEST  2 VIEW

[w chest pa]
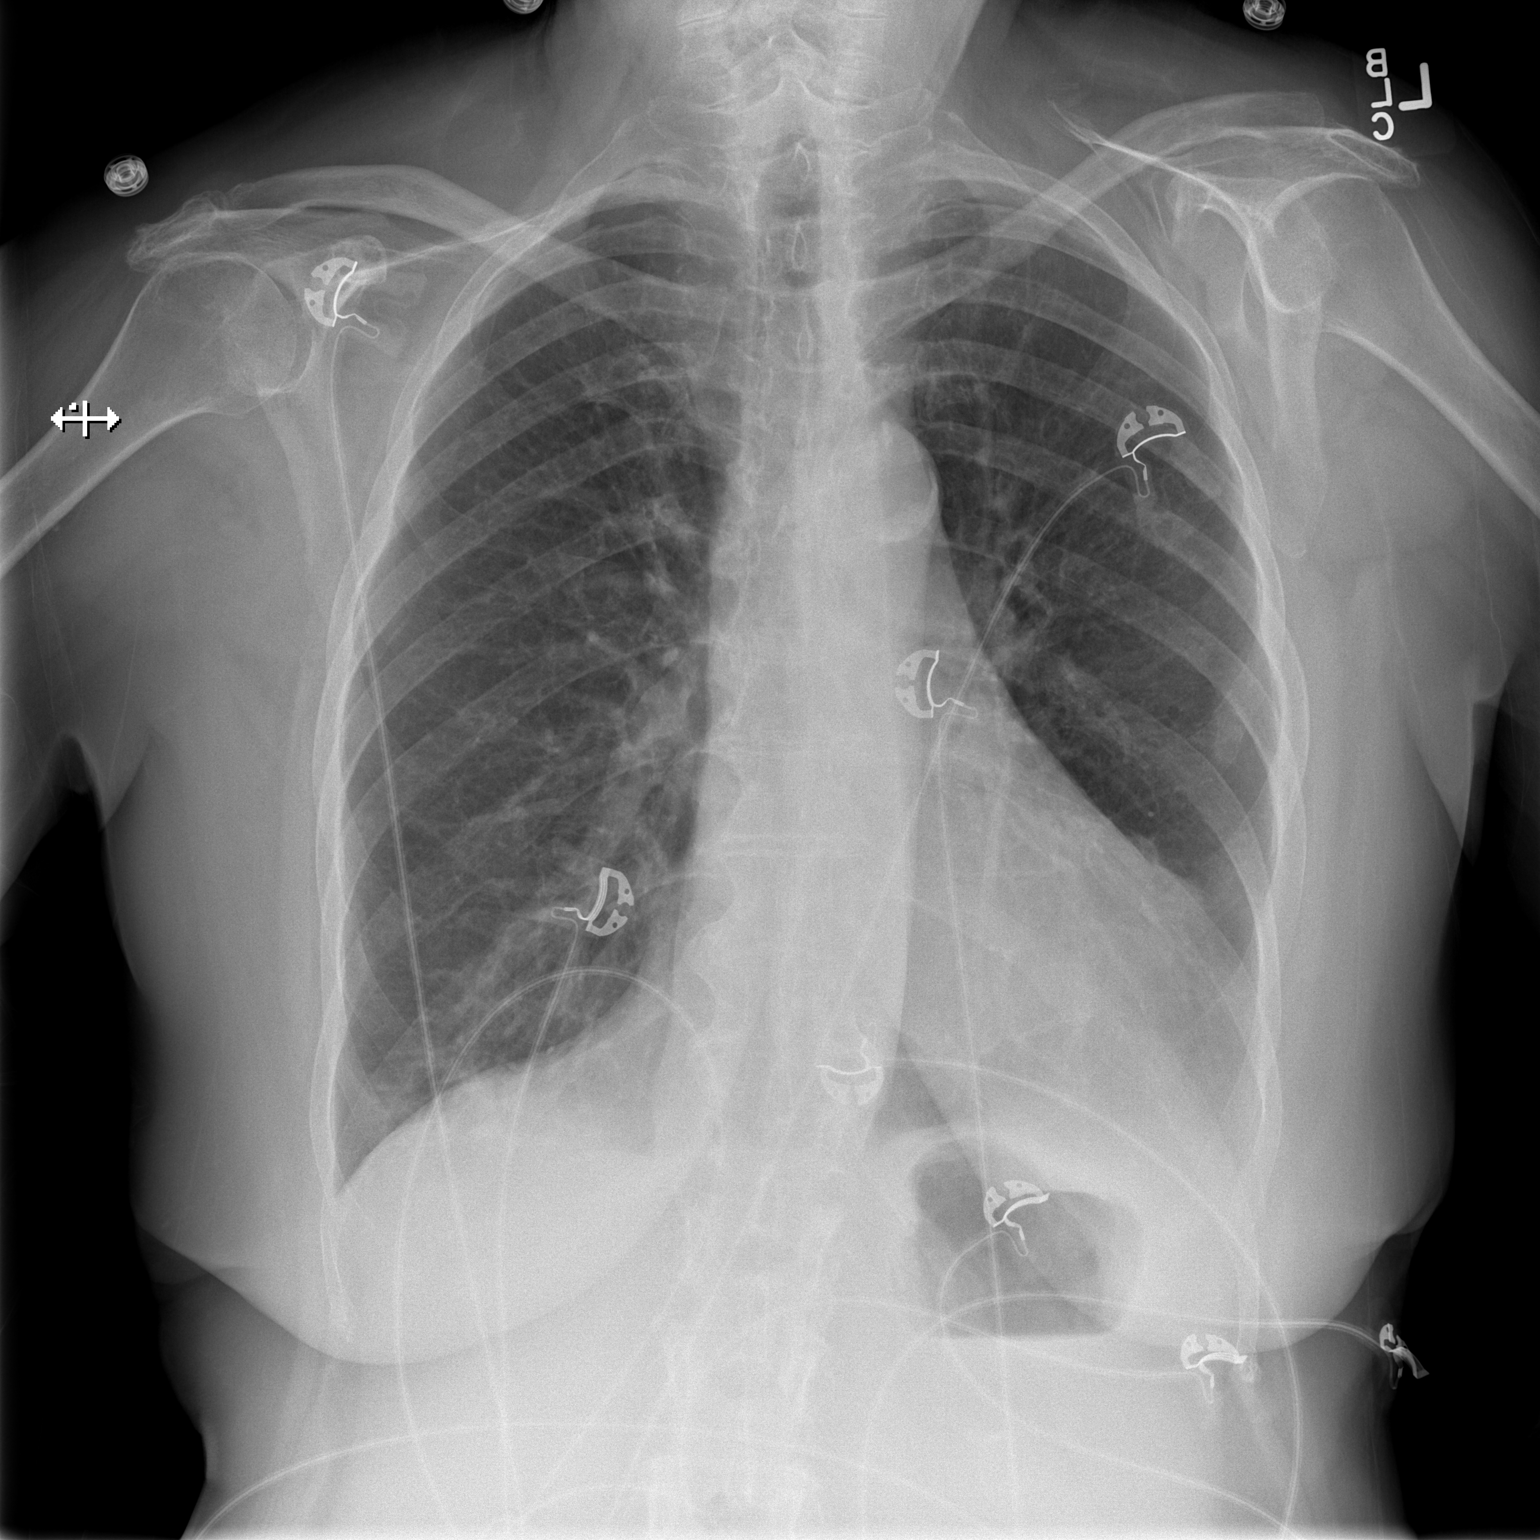

[w chest lat]
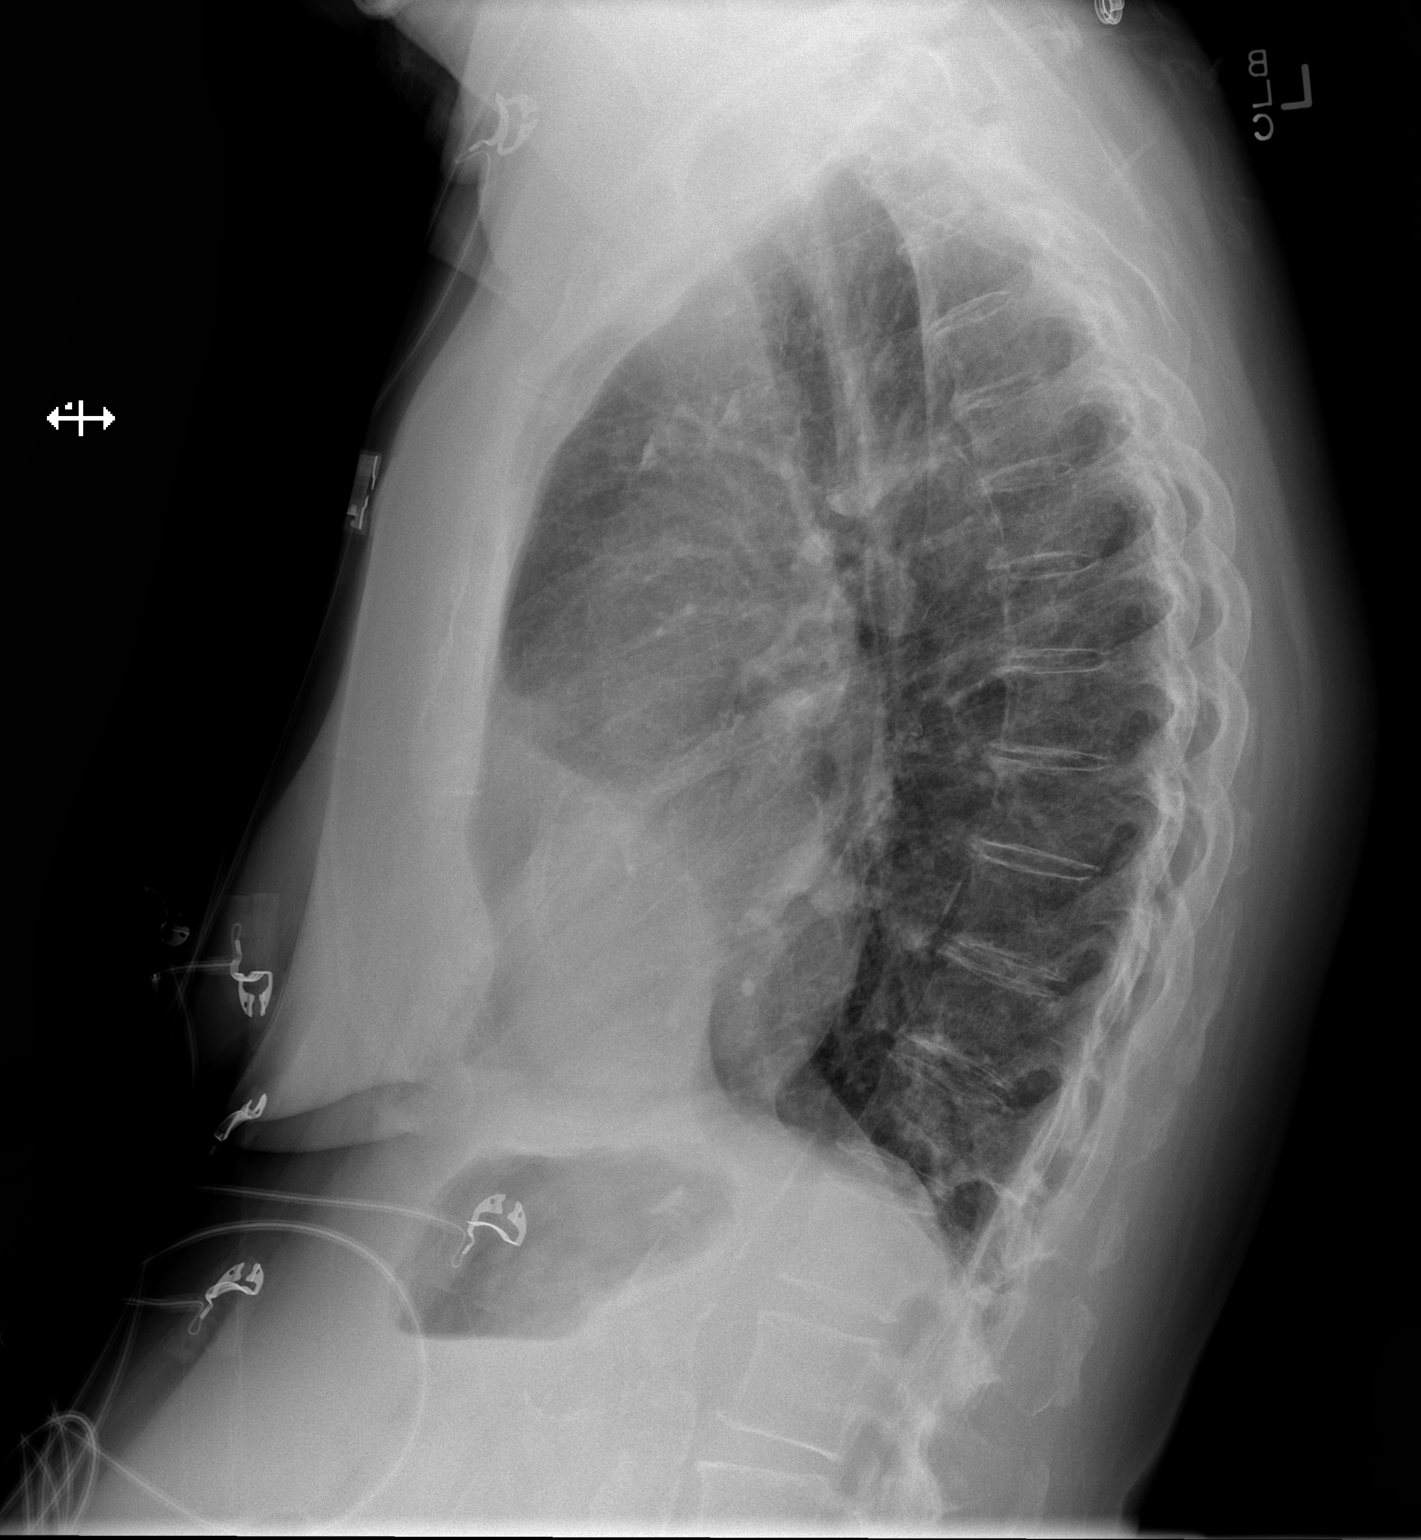

[2 of 2 positions shown; findings below may reference images not displayed]

FINDINGS: The heart size and mediastinal contours are stable. There is diffuse
atherosclerosis of the aorta and coronary arteries. There is chronic
blunting of the left costophrenic angle which appears unchanged. No
significant residual right basilar airspace disease is identified.
There is no edema. The osseous structures appear unchanged.
IMPRESSION: Interval clearing of right basilar airspace disease. No acute
findings identified.

## 2015-03-16 ENCOUNTER — Emergency Department (HOSPITAL_COMMUNITY): Payer: Medicare Other

## 2015-03-16 ENCOUNTER — Encounter (HOSPITAL_COMMUNITY): Payer: Self-pay

## 2015-03-16 ENCOUNTER — Inpatient Hospital Stay (HOSPITAL_COMMUNITY)
Admission: EM | Admit: 2015-03-16 | Discharge: 2015-03-20 | DRG: 287 | Disposition: A | Discharge status: Home / Self Care | Payer: Medicare Other | Attending: Internal Medicine | Admitting: Internal Medicine

## 2015-03-16 DIAGNOSIS — Z91041 Radiographic dye allergy status: Secondary | ICD-10-CM

## 2015-03-16 DIAGNOSIS — E785 Hyperlipidemia, unspecified: Secondary | ICD-10-CM | POA: Diagnosis present

## 2015-03-16 DIAGNOSIS — E876 Hypokalemia: Secondary | ICD-10-CM | POA: Diagnosis present

## 2015-03-16 DIAGNOSIS — I5022 Chronic systolic (congestive) heart failure: Secondary | ICD-10-CM | POA: Diagnosis present

## 2015-03-16 DIAGNOSIS — R079 Chest pain, unspecified: Secondary | ICD-10-CM | POA: Diagnosis not present

## 2015-03-16 DIAGNOSIS — E119 Type 2 diabetes mellitus without complications: Secondary | ICD-10-CM | POA: Diagnosis present

## 2015-03-16 DIAGNOSIS — R072 Precordial pain: Secondary | ICD-10-CM

## 2015-03-16 DIAGNOSIS — F17210 Nicotine dependence, cigarettes, uncomplicated: Secondary | ICD-10-CM | POA: Diagnosis present

## 2015-03-16 DIAGNOSIS — I2510 Atherosclerotic heart disease of native coronary artery without angina pectoris: Secondary | ICD-10-CM | POA: Diagnosis present

## 2015-03-16 DIAGNOSIS — Z789 Other specified health status: Secondary | ICD-10-CM

## 2015-03-16 DIAGNOSIS — M797 Fibromyalgia: Secondary | ICD-10-CM | POA: Diagnosis present

## 2015-03-16 DIAGNOSIS — Z794 Long term (current) use of insulin: Secondary | ICD-10-CM

## 2015-03-16 DIAGNOSIS — I472 Ventricular tachycardia, unspecified: Secondary | ICD-10-CM

## 2015-03-16 DIAGNOSIS — Z955 Presence of coronary angioplasty implant and graft: Secondary | ICD-10-CM

## 2015-03-16 DIAGNOSIS — F419 Anxiety disorder, unspecified: Secondary | ICD-10-CM | POA: Diagnosis present

## 2015-03-16 DIAGNOSIS — Z888 Allergy status to other drugs, medicaments and biological substances status: Secondary | ICD-10-CM

## 2015-03-16 DIAGNOSIS — K219 Gastro-esophageal reflux disease without esophagitis: Secondary | ICD-10-CM | POA: Diagnosis present

## 2015-03-16 DIAGNOSIS — Z91012 Allergy to eggs: Secondary | ICD-10-CM

## 2015-03-16 DIAGNOSIS — F0390 Unspecified dementia without behavioral disturbance: Secondary | ICD-10-CM | POA: Diagnosis present

## 2015-03-16 DIAGNOSIS — I252 Old myocardial infarction: Secondary | ICD-10-CM

## 2015-03-16 DIAGNOSIS — Z882 Allergy status to sulfonamides status: Secondary | ICD-10-CM

## 2015-03-16 DIAGNOSIS — R0789 Other chest pain: Secondary | ICD-10-CM

## 2015-03-16 DIAGNOSIS — I10 Essential (primary) hypertension: Secondary | ICD-10-CM | POA: Diagnosis present

## 2015-03-16 DIAGNOSIS — F17200 Nicotine dependence, unspecified, uncomplicated: Secondary | ICD-10-CM

## 2015-03-16 DIAGNOSIS — Z9851 Tubal ligation status: Secondary | ICD-10-CM

## 2015-03-16 DIAGNOSIS — I255 Ischemic cardiomyopathy: Secondary | ICD-10-CM | POA: Diagnosis present

## 2015-03-16 DIAGNOSIS — G4730 Sleep apnea, unspecified: Secondary | ICD-10-CM | POA: Diagnosis present

## 2015-03-16 DIAGNOSIS — Z880 Allergy status to penicillin: Secondary | ICD-10-CM

## 2015-03-16 DIAGNOSIS — Z7982 Long term (current) use of aspirin: Secondary | ICD-10-CM

## 2015-03-16 DIAGNOSIS — J449 Chronic obstructive pulmonary disease, unspecified: Secondary | ICD-10-CM | POA: Diagnosis present

## 2015-03-16 DIAGNOSIS — Z7902 Long term (current) use of antithrombotics/antiplatelets: Secondary | ICD-10-CM

## 2015-03-16 DIAGNOSIS — M1990 Unspecified osteoarthritis, unspecified site: Secondary | ICD-10-CM | POA: Diagnosis present

## 2015-03-16 DIAGNOSIS — Z881 Allergy status to other antibiotic agents status: Secondary | ICD-10-CM

## 2015-03-16 LAB — BASIC METABOLIC PANEL
ANION GAP: 12 (ref 5–15)
BUN: 20 mg/dL (ref 6–23)
CALCIUM: 9.1 mg/dL (ref 8.4–10.5)
CO2: 26 mmol/L (ref 19–32)
CREATININE: 0.87 mg/dL (ref 0.50–1.10)
Chloride: 98 mmol/L (ref 96–112)
GFR calc non Af Amer: 65 mL/min — ABNORMAL LOW (ref >90)
GFR, EST AFRICAN AMERICAN: 75 mL/min — AB (ref >90)
Glucose, Bld: 247 mg/dL — ABNORMAL HIGH (ref 70–99)
POTASSIUM: 3.8 mmol/L (ref 3.5–5.1)
SODIUM: 136 mmol/L (ref 135–145)

## 2015-03-16 LAB — CBC WITH DIFFERENTIAL/PLATELET
BASOS PCT: 0 % (ref 0–1)
Basophils Absolute: 0 10*3/uL (ref 0.0–0.1)
Eosinophils Absolute: 0.1 10*3/uL (ref 0.0–0.7)
Eosinophils Relative: 1 % (ref 0–5)
HCT: 43 % (ref 36.0–46.0)
Hemoglobin: 14.6 g/dL (ref 12.0–15.0)
Lymphocytes Relative: 25 % (ref 12–46)
Lymphs Abs: 2.7 10*3/uL (ref 0.7–4.0)
MCH: 32.1 pg (ref 26.0–34.0)
MCHC: 34 g/dL (ref 30.0–36.0)
MCV: 94.5 fL (ref 78.0–100.0)
MONO ABS: 0.4 10*3/uL (ref 0.1–1.0)
Monocytes Relative: 4 % (ref 3–12)
NEUTROS ABS: 7.5 10*3/uL (ref 1.7–7.7)
Neutrophils Relative %: 70 % (ref 43–77)
PLATELETS: 178 10*3/uL (ref 150–400)
RBC: 4.55 MIL/uL (ref 3.87–5.11)
RDW: 12.8 % (ref 11.5–15.5)
WBC: 10.7 10*3/uL — ABNORMAL HIGH (ref 4.0–10.5)

## 2015-03-16 LAB — BRAIN NATRIURETIC PEPTIDE: B Natriuretic Peptide: 107.2 pg/mL — ABNORMAL HIGH (ref 0.0–100.0)

## 2015-03-16 LAB — GLUCOSE, CAPILLARY: GLUCOSE-CAPILLARY: 304 mg/dL — AB (ref 70–99)

## 2015-03-16 LAB — I-STAT TROPONIN, ED: TROPONIN I, POC: 0 ng/mL (ref 0.00–0.08)

## 2015-03-16 MED ORDER — ACETAMINOPHEN 325 MG PO TABS
650.0000 mg | ORAL_TABLET | Freq: Once | ORAL | Status: AC
  Administered 2015-03-16: 650 mg via ORAL
  Filled 2015-03-16: qty 2

## 2015-03-16 NOTE — ED Provider Notes (Signed)
Level V caveat dementia. History is obtained from patient and patient's daughter who accompanies her. Patient complained of 2 episodes of chest pain this afternoon left-sided nonradiating anterior typical of "heart pain" she's had in the past. She was treated by EMS with aspirin 324 mg and one sublingual nitroglycerin. Presently pain free. Patient also suffers from chronic neck pain for several years. On exam no distress lungs clear to auscultation heart regular rate and rhythm abdomen obese nontender extremities without edema  Orlie Dakin, MD 03/17/15 [id number]

## 2015-03-16 NOTE — ED Provider Notes (Signed)
CSN: [id number]     Arrival date & time 03/16/15  1927 History   First MD Initiated Contact with Patient 03/16/15 1928     Chief Complaint  Patient presents with  . Chest Pain     (Consider location/radiation/quality/duration/timing/severity/associated sxs/prior Treatment) Patient is a 74 y.o. female presenting with chest pain. The history is provided by the patient and medical records.  Chest Pain  74 year old female with past medical history significant for hypertension, hyperlipidemia, diabetes, COPD, cardiomyopathy, coronary artery disease, prior inferior MI with stenting 2 to RCA, presenting to the ED for chest pain.  Patient states pain began around 1730, described as a chest heaviness. She states there were some radiation to her left shoulder and arm. She denies any shortness of breath, nausea, vomiting, diaphoresis, dizziness, or weakness. Patient was given 325 aspirin and sublingual nitroglycerin with relief of pain. O2 sats were 90% upon EMS arrival, patient is a smoker. She is not currently on home oxygen.  Patient has chronic cough, no reported fever, chills, sweats.  Hospitalized in February for HCAP.  She has no complaints on arrival.  Patient followed by cardiology, Dr. Angelena Form.  Most recent 2D echo in 2015 with estimated 30-35% EF.  VSS.  Past Medical History  Diagnosis Date  . CAD (coronary artery disease)     a. s/p inferior STEMI 12/29/10 with rotablator atherectomy RCA 12/31/10 and DES x 2 RCA;  b. Lexiscan Myoview (11/2013):  Low risk study, no ischemia, inferolateral defect likely consistent with attenuation versus scar.  . Ischemic cardiomyopathy     EF 45%cath 2012 EF55% 5/12; 35% 11/14  . Diabetes mellitus     type 2  . HTN (hypertension)   . Hyperlipidemia   . Diverticulosis   . Respiratory arrest//ACE Inhibitor presumed cause   . COPD (chronic obstructive pulmonary disease)   . Small bowel obstruction     from a sigmoid stricture  . Nephrolithiasis   . Sleep  apnea   . Overweight(278.02)   . Osteoarthritis   . GERD (gastroesophageal reflux disease)   . Anxiety   . Glaucoma   . Memory deficit   . Dementia    Past Surgical History  Procedure Laterality Date  . Bilateral tubal ligation    . Lithotripsy    . 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 Cardiac surgery     Family History  Problem Relation Age of Onset  . Breast cancer Maternal Grandmother   . Diabetes Father   . Heart disease Father   . Diabetes Sister   . Colon cancer Neg Hx    History  Substance Use Topics  . Smoking status: Current Every Day Smoker -- 1.00 packs/day for 25 years    Types: Cigarettes  . Smokeless tobacco: Never Used     Comment: over a ppd for 40+ years; quit 10/2004.  unsuccessfully tried eCigs.  . Alcohol Use: No   OB History    No data available     Review of Systems  Cardiovascular: Positive for chest pain.  All other systems reviewed and are negative.     Allergies  Lisinopril; Eggs or egg-derived products; Erythromycin; Iodine; Penicillins; Pentazocine lactate; Propoxyphene hcl; Quinapril hcl; Spironolactone; and Sulfonamide derivatives  Home Medications   Prior to Admission medications   Medication Sig Start Date End Date Taking? Authorizing Provider  albuterol (PROVENTIL HFA;VENTOLIN HFA) 108 (90 BASE) MCG/ACT inhaler Inhale 2 puffs into the lungs every 6 (six) hours as needed  for wheezing.    Historical Provider, MD  albuterol (PROVENTIL) (2.5 MG/3ML) 0.083% nebulizer solution Take 2.5 mg by nebulization every 6 (six) hours as needed for wheezing or shortness of breath.     Historical Provider, MD  aspirin EC 81 MG tablet Take 81 mg by mouth daily.    Historical Provider, MD  chlorpheniramine-HYDROcodone (TUSSIONEX) 10-8 MG/5ML LQCR Take 5 mLs by mouth every 12 (twelve) hours as needed for cough. 01/01/15   Simbiso Ranga, MD  clopidogrel (PLAVIX) 75 MG tablet TAKE 1 TABLET BY MOUTH EVERY DAY 11/11/14    Burnell Blanks, MD  furosemide (LASIX) 20 MG tablet Take 1 tablet (20 mg total) by mouth as needed. 10/28/13   Liliane Shi, PA-C  insulin glargine (LANTUS) 100 UNIT/ML injection Inject 0.1 mLs (10 Units total) into the skin daily. 01/25/14   Hosie Poisson, MD  insulin lispro (HUMALOG) 100 UNIT/ML injection Inject 0-10 Units into the skin 3 (three) times daily before meals. Sliding scale: below 150=0 units, 150-200=2 units, 201-250=4 units, 251-300=6 units, 301-350=8 units, above 350=10 units    Historical Provider, MD  ipratropium (ATROVENT) 0.02 % nebulizer solution Take 0.5 mg by nebulization 4 (four) times daily. Four times daily 10/30/13   [name] S Parrett, NP  isosorbide mononitrate (IMDUR) 30 MG 24 hr tablet Take 1 tablet (30 mg total) by mouth daily. 10/14/13   Donita Brooks, NP  levofloxacin (LEVAQUIN) 500 MG tablet Take 1 tablet (500 mg total) by mouth daily. 01/01/15   Simbiso Ranga, MD  LORazepam (ATIVAN) 0.5 MG tablet Take 0.5 mg by mouth 2 (two) times daily as needed for anxiety.    Historical Provider, MD  metFORMIN (GLUCOPHAGE) 500 MG tablet Take 500 mg by mouth daily before supper. Taking 4 pills at dinner    Historical Provider, MD  metoprolol succinate (TOPROL-XL) 25 MG 24 hr tablet Take 1 tablet (25 mg total) by mouth daily. 10/14/13   Donita Brooks, NP  mometasone-formoterol (DULERA) 100-5 MCG/ACT AERO Inhale 2 puffs into the lungs 2 (two) times daily.    Historical Provider, MD  NAMENDA XR 28 MG CP24 24 hr capsule TAKE ONE CAPSULE BY MOUTH EVERY DAY 02/08/15   Trudie Buckler, NP  nitroGLYCERIN (NITROSTAT) 0.4 MG SL tablet Place 1 tablet (0.4 mg total) under the tongue every 5 (five) minutes as needed. For chest pain. 06/18/13   Burnell Blanks, MD  PARoxetine (PAXIL) 10 MG tablet Take 10 mg by mouth daily.    Historical Provider, MD  polyethylene glycol (MIRALAX / GLYCOLAX) packet Take 17 g by mouth daily as needed (for constipation).    Historical Provider, MD   pravastatin (PRAVACHOL) 40 MG tablet TAKE 1 TABLET (40 MG TOTAL) BY MOUTH DAILY. <PLEASE MAKE APPOINTMENT FOR REFILLS> 12/25/14   Burnell Blanks, MD  pregabalin (LYRICA) 75 MG capsule Take 75 mg by mouth 2 (two) times daily.      Historical Provider, MD  senna-docusate (SENOKOT-S) 8.6-50 MG per tablet Take 2 tablets by mouth at bedtime. Hold for diarrhea Patient taking differently: Take 2 tablets by mouth at bedtime as needed for moderate constipation. Hold for diarrhea 10/14/13   Donita Brooks, NP  Travoprost, BAK Free, (TRAVATAN) 0.004 % SOLN ophthalmic solution Place 1 drop into both eyes daily.    Historical Provider, MD   BP 130/64 mmHg  Pulse 79  Temp(Src) 97.9 F (36.6 C) (Oral)  Resp 18  SpO2 96%   Physical Exam  Constitutional: She  is oriented to person, place, and time. She appears well-developed and well-nourished. No distress.  HENT:  Head: Normocephalic and atraumatic.  Mouth/Throat: Oropharynx is clear and moist.  Eyes: Conjunctivae and EOM are normal. Pupils are equal, round, and reactive to light.  Neck: Normal range of motion. Neck supple.  Cardiovascular: Normal rate, regular rhythm and normal heart sounds.   Pulmonary/Chest: Effort normal and breath sounds normal. No respiratory distress. She has no wheezes.  Abdominal: Soft. Bowel sounds are normal. There is no tenderness. There is no guarding.  Musculoskeletal: Normal range of motion. She exhibits no edema.  Neurological: She is alert and oriented to person, place, and time.  Skin: Skin is warm and dry. She is not diaphoretic.  Psychiatric: She has a normal mood and affect.  Nursing note and vitals reviewed.   ED Course  Procedures (including critical care time) Labs Review Labs Reviewed  CBC WITH DIFFERENTIAL/PLATELET - Abnormal; Notable for the following:    WBC 10.7 (*)    All other components within normal limits  BASIC METABOLIC PANEL - Abnormal; Notable for the following:    Glucose, Bld 247  (*)    GFR calc non Af Amer 65 (*)    GFR calc Af Amer 75 (*)    All other components within normal limits  BRAIN NATRIURETIC PEPTIDE  I-STAT TROPOININ, ED    Imaging Review No results found.   EKG Interpretation   Date/Time:  Tuesday March 16 2015 19:44:17 EDT Ventricular Rate:  80 PR Interval:  177 QRS Duration: 86 QT Interval:  411 QTC Calculation: 474 R Axis:   80 Text Interpretation:  Sinus rhythm Consider left atrial enlargement No  significant change since last tracing Confirmed by JACUBOWITZ  MD, SAM  [phone]) on 03/16/2015 7:54:23 PM      MDM   Final diagnoses:  Chest pain   74 year old female with chest pressure and left arm and shoulder pain earlier today. Pain was relieved with aspirin and nitroglycerin given by EMS. Patient currently has no complaints. EKG sinus rhythm without acute ischemic changes. Lab work reassuring, troponin negative. Chest x-ray is clear. Patient remains pain-free while in the emergency department. She does have multiple risk factors and has a heart score of 6, feel she would benefit from overnight observation for chest pain rule out.  Case discussed with hospitalist, Dr. Eulas Post-- will admit to tele observation.  Temp admission orders placed, VS remain stable.  Larene Pickett, PA-C 03/16/15 2303  Orlie Dakin, MD 03/17/15 [phone]

## 2015-03-16 NOTE — ED Notes (Signed)
Per EMS, chest started gradually around 1730 with radiation to the left arm and shoulder. Pt has some short term memory loss and the cause is unknow. Fire gave 324asa, EMS gave 1 subl nitro with relief in route. Denies sob, lung sounds clear. o2 sats at 90%, pt is a smoker. 12 lead unremarkable. No IV access.

## 2015-03-17 ENCOUNTER — Other Ambulatory Visit (HOSPITAL_COMMUNITY): Payer: Medicare Other

## 2015-03-17 ENCOUNTER — Observation Stay (HOSPITAL_COMMUNITY): Payer: Medicare Other

## 2015-03-17 DIAGNOSIS — R072 Precordial pain: Secondary | ICD-10-CM | POA: Diagnosis not present

## 2015-03-17 DIAGNOSIS — I25119 Atherosclerotic heart disease of native coronary artery with unspecified angina pectoris: Secondary | ICD-10-CM | POA: Diagnosis not present

## 2015-03-17 DIAGNOSIS — E119 Type 2 diabetes mellitus without complications: Secondary | ICD-10-CM

## 2015-03-17 DIAGNOSIS — R079 Chest pain, unspecified: Principal | ICD-10-CM

## 2015-03-17 DIAGNOSIS — I472 Ventricular tachycardia, unspecified: Secondary | ICD-10-CM

## 2015-03-17 DIAGNOSIS — Z888 Allergy status to other drugs, medicaments and biological substances status: Secondary | ICD-10-CM

## 2015-03-17 DIAGNOSIS — I255 Ischemic cardiomyopathy: Secondary | ICD-10-CM | POA: Diagnosis present

## 2015-03-17 DIAGNOSIS — I25118 Atherosclerotic heart disease of native coronary artery with other forms of angina pectoris: Secondary | ICD-10-CM

## 2015-03-17 DIAGNOSIS — Z720 Tobacco use: Secondary | ICD-10-CM

## 2015-03-17 DIAGNOSIS — F17200 Nicotine dependence, unspecified, uncomplicated: Secondary | ICD-10-CM

## 2015-03-17 DIAGNOSIS — Z789 Other specified health status: Secondary | ICD-10-CM

## 2015-03-17 HISTORY — DX: Ventricular tachycardia: I47.2

## 2015-03-17 HISTORY — DX: Ischemic cardiomyopathy: I25.5

## 2015-03-17 HISTORY — DX: Ventricular tachycardia, unspecified: I47.20

## 2015-03-17 HISTORY — DX: Allergy status to other drugs, medicaments and biological substances: Z88.8

## 2015-03-17 LAB — CBC
HCT: 45.3 % (ref 36.0–46.0)
HEMOGLOBIN: 15.3 g/dL — AB (ref 12.0–15.0)
MCH: 32.1 pg (ref 26.0–34.0)
MCHC: 33.8 g/dL (ref 30.0–36.0)
MCV: 95 fL (ref 78.0–100.0)
Platelets: 164 10*3/uL (ref 150–400)
RBC: 4.77 MIL/uL (ref 3.87–5.11)
RDW: 12.7 % (ref 11.5–15.5)
WBC: 9.7 10*3/uL (ref 4.0–10.5)

## 2015-03-17 LAB — TROPONIN I
Troponin I: <0.03 ng/mL (ref <0.031)
Troponin I: <0.03 ng/mL (ref <0.031)
Troponin I: <0.03 ng/mL (ref <0.031)

## 2015-03-17 LAB — GLUCOSE, CAPILLARY
Glucose-Capillary: 165 mg/dL — ABNORMAL HIGH (ref 70–99)
Glucose-Capillary: 202 mg/dL — ABNORMAL HIGH (ref 70–99)
Glucose-Capillary: 197 mg/dL — ABNORMAL HIGH (ref 70–99)
Glucose-Capillary: 162 mg/dL — ABNORMAL HIGH (ref 70–99)

## 2015-03-17 LAB — CREATININE, SERUM
Creatinine, Ser: 0.86 mg/dL (ref 0.50–1.10)
GFR calc non Af Amer: 65 mL/min — ABNORMAL LOW (ref >90)
GFR, EST AFRICAN AMERICAN: 76 mL/min — AB (ref >90)

## 2015-03-17 MED ORDER — MEMANTINE HCL ER 28 MG PO CP24
28.0000 mg | ORAL_CAPSULE | Freq: Every day | ORAL | Status: DC
  Administered 2015-03-17 – 2015-03-20 (×4): 28 mg via ORAL
  Filled 2015-03-17 (×4): qty 1

## 2015-03-17 MED ORDER — SODIUM CHLORIDE 0.9 % IJ SOLN
3.0000 mL | Freq: Two times a day (BID) | INTRAMUSCULAR | Status: DC
  Administered 2015-03-17 – 2015-03-18 (×2): 3 mL via INTRAVENOUS

## 2015-03-17 MED ORDER — NITROGLYCERIN 0.4 MG SL SUBL: 0.4000 mg | SUBLINGUAL_TABLET | SUBLINGUAL | Status: DC | PRN

## 2015-03-17 MED ORDER — SODIUM CHLORIDE 0.9 % IV SOLN: 250.0000 mL | INTRAVENOUS | Status: DC | PRN

## 2015-03-17 MED ORDER — TRAMADOL HCL 50 MG PO TABS
50.0000 mg | ORAL_TABLET | Freq: Four times a day (QID) | ORAL | Status: DC | PRN
  Filled 2015-03-17: qty 1

## 2015-03-17 MED ORDER — SODIUM CHLORIDE 0.9 % IJ SOLN: 3.0000 mL | INTRAMUSCULAR | Status: DC | PRN

## 2015-03-17 MED ORDER — REGADENOSON 0.4 MG/5ML IV SOLN
0.4000 mg | Freq: Once | INTRAVENOUS | Status: AC
Start: 2015-03-17 — End: 2015-03-17
  Administered 2015-03-17: 0.4 mg via INTRAVENOUS
  Filled 2015-03-17: qty 5

## 2015-03-17 MED ORDER — PERFLUTREN LIPID MICROSPHERE
1.0000 mL | INTRAVENOUS | Status: AC | PRN
  Administered 2015-03-17: 4 mL via INTRAVENOUS
  Filled 2015-03-17: qty 10

## 2015-03-17 MED ORDER — ISOSORBIDE MONONITRATE ER 30 MG PO TB24
30.0000 mg | ORAL_TABLET | Freq: Every day | ORAL | Status: DC
  Administered 2015-03-17 – 2015-03-20 (×4): 30 mg via ORAL
  Filled 2015-03-17 (×4): qty 1

## 2015-03-17 MED ORDER — CLOPIDOGREL BISULFATE 75 MG PO TABS
75.0000 mg | ORAL_TABLET | Freq: Every day | ORAL | Status: DC
  Administered 2015-03-17 – 2015-03-20 (×4): 75 mg via ORAL
  Filled 2015-03-17 (×4): qty 1

## 2015-03-17 MED ORDER — PERFLUTREN LIPID MICROSPHERE: 1.0000 mL | INTRAVENOUS | Status: DC | PRN

## 2015-03-17 MED ORDER — INSULIN ASPART 100 UNIT/ML ~~LOC~~ SOLN
0.0000 [IU] | Freq: Three times a day (TID) | SUBCUTANEOUS | Status: DC
  Administered 2015-03-17: 2 [IU] via SUBCUTANEOUS
  Administered 2015-03-17: 3 [IU] via SUBCUTANEOUS
  Administered 2015-03-18: 2 [IU] via SUBCUTANEOUS
  Administered 2015-03-18 (×2): 1 [IU] via SUBCUTANEOUS
  Administered 2015-03-19: 2 [IU] via SUBCUTANEOUS
  Administered 2015-03-19: 1 [IU] via SUBCUTANEOUS
  Administered 2015-03-19 – 2015-03-20 (×2): 2 [IU] via SUBCUTANEOUS
  Administered 2015-03-20: 3 [IU] via SUBCUTANEOUS

## 2015-03-17 MED ORDER — ALBUTEROL SULFATE (2.5 MG/3ML) 0.083% IN NEBU: 2.5000 mg | INHALATION_SOLUTION | Freq: Four times a day (QID) | RESPIRATORY_TRACT | Status: DC | PRN

## 2015-03-17 MED ORDER — ACETAMINOPHEN 325 MG PO TABS
650.0000 mg | ORAL_TABLET | ORAL | Status: DC | PRN
  Administered 2015-03-17 – 2015-03-18 (×4): 650 mg via ORAL
  Filled 2015-03-17 (×4): qty 2

## 2015-03-17 MED ORDER — ASPIRIN EC 81 MG PO TBEC
81.0000 mg | DELAYED_RELEASE_TABLET | Freq: Every day | ORAL | Status: DC
  Administered 2015-03-17: 81 mg via ORAL
  Filled 2015-03-17: qty 1

## 2015-03-17 MED ORDER — SODIUM CHLORIDE 0.9 % IV SOLN
INTRAVENOUS | Status: DC
  Administered 2015-03-17: 1000 mL via INTRAVENOUS

## 2015-03-17 MED ORDER — PREGABALIN 50 MG PO CAPS
50.0000 mg | ORAL_CAPSULE | Freq: Two times a day (BID) | ORAL | Status: DC
  Administered 2015-03-17 – 2015-03-20 (×7): 50 mg via ORAL
  Filled 2015-03-17 (×7): qty 1

## 2015-03-17 MED ORDER — ASPIRIN EC 81 MG PO TBEC
81.0000 mg | DELAYED_RELEASE_TABLET | Freq: Every day | ORAL | Status: DC
  Administered 2015-03-19 – 2015-03-20 (×2): 81 mg via ORAL
  Filled 2015-03-17 (×2): qty 1

## 2015-03-17 MED ORDER — METOPROLOL SUCCINATE ER 25 MG PO TB24
25.0000 mg | ORAL_TABLET | Freq: Every day | ORAL | Status: DC
  Administered 2015-03-17 – 2015-03-18 (×2): 25 mg via ORAL
  Filled 2015-03-17 (×2): qty 1

## 2015-03-17 MED ORDER — PRAVASTATIN SODIUM 40 MG PO TABS
40.0000 mg | ORAL_TABLET | Freq: Every day | ORAL | Status: DC
  Administered 2015-03-17 – 2015-03-19 (×3): 40 mg via ORAL
  Filled 2015-03-17 (×3): qty 1

## 2015-03-17 MED ORDER — SODIUM CHLORIDE 0.9 % IV SOLN: INTRAVENOUS | Status: DC

## 2015-03-17 MED ORDER — LORAZEPAM 0.5 MG PO TABS: 0.5000 mg | ORAL_TABLET | Freq: Two times a day (BID) | ORAL | Status: DC | PRN

## 2015-03-17 MED ORDER — TECHNETIUM TC 99M SESTAMIBI GENERIC - CARDIOLITE
10.0000 | Freq: Once | INTRAVENOUS | Status: AC | PRN
  Administered 2015-03-17: 10 via INTRAVENOUS

## 2015-03-17 MED ORDER — ENOXAPARIN SODIUM 40 MG/0.4ML ~~LOC~~ SOLN
40.0000 mg | SUBCUTANEOUS | Status: DC
  Administered 2015-03-17 – 2015-03-20 (×3): 40 mg via SUBCUTANEOUS
  Filled 2015-03-17 (×3): qty 0.4

## 2015-03-17 MED ORDER — PAROXETINE HCL 20 MG PO TABS
10.0000 mg | ORAL_TABLET | Freq: Every day | ORAL | Status: DC
  Administered 2015-03-17 – 2015-03-20 (×4): 10 mg via ORAL
  Filled 2015-03-17 (×4): qty 1

## 2015-03-17 MED ORDER — TECHNETIUM TC 99M SESTAMIBI GENERIC - CARDIOLITE
30.0000 | Freq: Once | INTRAVENOUS | Status: AC | PRN
  Administered 2015-03-17: 30 via INTRAVENOUS

## 2015-03-17 MED ORDER — MOMETASONE FURO-FORMOTEROL FUM 100-5 MCG/ACT IN AERO
2.0000 | INHALATION_SPRAY | Freq: Two times a day (BID) | RESPIRATORY_TRACT | Status: DC
  Administered 2015-03-17 – 2015-03-20 (×6): 2 via RESPIRATORY_TRACT
  Filled 2015-03-17: qty 8.8

## 2015-03-17 MED ORDER — ASPIRIN 81 MG PO CHEW
81.0000 mg | CHEWABLE_TABLET | ORAL | Status: AC
  Administered 2015-03-18: 81 mg via ORAL
  Filled 2015-03-17: qty 1

## 2015-03-17 MED ORDER — IPRATROPIUM BROMIDE 0.02 % IN SOLN
0.5000 mg | Freq: Four times a day (QID) | RESPIRATORY_TRACT | Status: DC
  Administered 2015-03-17 – 2015-03-20 (×13): 0.5 mg via RESPIRATORY_TRACT
  Filled 2015-03-17 (×15): qty 2.5

## 2015-03-17 MED ORDER — ONDANSETRON HCL 4 MG/2ML IJ SOLN: 4.0000 mg | Freq: Four times a day (QID) | INTRAMUSCULAR | Status: DC | PRN

## 2015-03-17 MED ORDER — REGADENOSON 0.4 MG/5ML IV SOLN
INTRAVENOUS | Status: AC
  Filled 2015-03-17: qty 5

## 2015-03-17 NOTE — Progress Notes (Addendum)
Inpatient Diabetes Program Recommendations  AACE/ADA: New Consensus Statement on Inpatient Glycemic Control (2013)  Target Ranges:  Prepandial:   less than 140 mg/dL      Peak postprandial:   less than 180 mg/dL (1-2 hours)      Critically ill patients:  140 - 180 mg/dL   Results for [name], [name] (MRN [id number]) as of 03/17/2015 07:34  Ref. Range 03/16/2015 23:49  Glucose-Capillary Latest Ref Range: 70-99 mg/dL 304 (H)   Reason for Visit: CP  Diabetes history: DM 2 Outpatient Diabetes medications: Lantus 10 units Daily, Humalog 0-10 units TID, Metformin 2,000 mg daily at supper Current orders for Inpatient glycemic control: Novolog 0-9 units TID  Inpatient Diabetes Program Recommendations Insulin - Basal: Patient's glucose is in the 200-300 range. Patient takes 10 units of basal at home. If patient is going for possible procedure, please consider ordering half of home dose Lantus 5 units Q24hrs.  Thanks,  Tama Headings RN, MSN, [facility] Inpatient Diabetes Coordinator Team Pager [phone]

## 2015-03-17 NOTE — Progress Notes (Signed)
'    Patient had abnormal nuclear stress test. She needs L/RHC. WIll set up for tomorrow. IT is listed that she had a contrast allergy to dye. She does not remember having had a bad reaction. She has multiple heart caths in the past without any difficulties. She had a CT chest with contrast a few months ago with no pre-medication and did not have a bad reaction.   Risks / Complications include, but not limited to: Death, MI, CVA/TIA, VF/VT (with defibrillation), Bradycardia (need for temporary pacer placement), contrast induced nephropathy, contrast allergy, bleeding / bruising / hematoma / pseudoaneurysm, vascular or coronary injury (with possible emergent CT or Vascular Surgery), adverse medication reactions, infection.  The patient voices understanding and agrees to proceed.   I have signed the consent form and placed it on the chart for patient signature and RN witness.     Eileen Stanford, PA-C  03/17/2015 4:29 PM

## 2015-03-17 NOTE — Progress Notes (Signed)
  Echocardiogram 2D Echocardiogram has been performed.  Lysle Rubens 03/17/2015, 4:50 PM

## 2015-03-17 NOTE — Progress Notes (Signed)
Lexiscan MV performed. 1-day study, [facility] to read.  Rosaria Ferries, PA-C 03/17/2015 10:32 AM Beeper [phone]

## 2015-03-17 NOTE — Progress Notes (Signed)
[facility] PROGRESS NOTE  ANIYLA HARLING ZDG:[id number] DOB: Jun 17, 1941 DOA: 03/16/2015 PCP: Vidal Schwalbe, MD  Assessment/Plan: 1. Chest pain 1. Cardiology following 2. Abnormal stress test 3. Plans for R and L heart cath 4. NPO after midnight 2. DM 1. Cont on SSI coverage 3. Tobacco abuse 1. stable 4. COPD 1. No wheezing on exam 2. On bronchodilators and PRN nebs 5. Chronic systolic CHF 1. Stable, appears euvolemic 2. 2d echo with EF of 45% 6. DVT prophylaxis 1. Lovenox subQ  Code Status: Full Family Communication: Pt in room Disposition Plan: Pending   Consultants:  Cardiology  Procedures:    Antibiotics:   (indicate start date, and stop date if known)  HPI/Subjective: Currently without chest pains  Objective: Filed Vitals:   03/17/15 1027 03/17/15 1029 03/17/15 1216 03/17/15 1544  BP: 185/82 177/81  129/62  Pulse:    70  Temp:    98 F (36.7 C)  TempSrc:    Oral  Resp: 34 36  18  Height:      Weight:      SpO2:   91% 92%   No intake or output data in the 24 hours ending 03/17/15 1946 Filed Weights   03/16/15 1946 03/16/15 2351 03/17/15 0601  Weight: 68.04 kg (150 lb) 71.986 kg (158 lb 11.2 oz) 71.668 kg (158 lb)    Exam:   General:  Awake, in nad  Cardiovascular: regular, s1, s2  Respiratory: normal resp effort, no wheezing  Abdomen: soft,nondistended  Musculoskeletal: perfused, no clubbing   Data Reviewed: Basic Metabolic Panel:  Recent Labs Lab 03/16/15 1957 03/17/15 0311  NA 136  --   K 3.8  --   CL 98  --   CO2 26  --   GLUCOSE 247*  --   BUN 20  --   CREATININE 0.87 0.86  CALCIUM 9.1  --    Liver Function Tests: No results for input(s): AST, ALT, ALKPHOS, BILITOT, PROT, ALBUMIN in the last 168 hours. No results for input(s): LIPASE, AMYLASE in the last 168 hours. No results for input(s): AMMONIA in the last 168 hours. CBC:  Recent Labs Lab 03/16/15 1957 03/17/15 0311  WBC 10.7* 9.7  NEUTROABS 7.5   --   HGB 14.6 15.3*  HCT 43.0 45.3  MCV 94.5 95.0  PLT 178 164   Cardiac Enzymes:  Recent Labs Lab 03/17/15 0311 03/17/15 0530 03/17/15 0900  TROPONINI <0.03 <0.03 <0.03   BNP (last 3 results)  Recent Labs  12/30/14 2354 03/16/15 1957  BNP 95.6 107.2*    ProBNP (last 3 results) No results for input(s): PROBNP in the last 8760 hours.  CBG:  Recent Labs Lab 03/16/15 2349 03/17/15 0810 03/17/15 1144 03/17/15 1649  GLUCAP 304* 165* 202* 197*    No results found for this or any previous visit (from the past 240 hour(s)).   Studies: Dg Chest 2 View  03/16/2015   CLINICAL DATA:  74 year old female with chest pain radiating into the left arm and shoulder  EXAM: CHEST  2 VIEW  COMPARISON:  Prior chest x-ray 12/31/2014  FINDINGS: Stable cardiac and mediastinal contours with borderline cardiomegaly. Atherosclerotic calcification present in the transverse aorta. Stable appearance of the left lung base secondary to left heart enlargement and a prominent pericardial fat pad. No evidence of pulmonary edema, new focal airspace consolidation, pleural effusion or pneumothorax. Metallic coronary stent overlies the right heart. No acute osseous abnormality. Mild multilevel degenerative disc disease.  IMPRESSION: Stable chest x-ray without  evidence of acute cardiopulmonary process.   Electronically Signed   By: Jacqulynn Cadet M.D.   On: 03/16/2015 21:30   Nm Myocar Multi W/spect W/wall Motion / Ef  03/17/2015   CLINICAL DATA:  Chest pain  EXAM: Lexiscan Myovue  TECHNIQUE: The patient received IV Lexiscan .4mg  over 15 seconds. 33.0 mCi of Technetium 17m Sestamibi injected at 30 seconds. Quantitative SPECT images were obtained in the vertical, horizontal and short axis planes after a 45 minute delay. Rest images were obtained with similar planes and delay using 10.2 mCi of Technetium 72m Sestamibi.  FINDINGS: ECG:  NSR, no change with Lexiscan infusion.  Quantitiative Gated SPECT EF:  47%,  diffuse hypokinesis.  Perfusion Images: There was a small, mild apical anterior perfusion defect with Lexiscan stress. No significant perfusion defect at rest.  IMPRESSION: EF 47% with small, mild reversible apical anterior perfusion defect that may be indicative of a small area of ischemia. I would rate this an intermediate risk study given combination of decreased EF and small reversible perfusion defect.  [name]   Electronically Signed   By: Loralie Champagne M.D.   On: 03/17/2015 15:38    Scheduled Meds: . [START ON 03/18/2015] aspirin  81 mg Oral Pre-Cath  . [START ON 03/19/2015] aspirin EC  81 mg Oral Daily  . clopidogrel  75 mg Oral Daily  . enoxaparin (LOVENOX) injection  40 mg Subcutaneous Q24H  . insulin aspart  0-9 Units Subcutaneous TID WC  . ipratropium  0.5 mg Nebulization QID  . isosorbide mononitrate  30 mg Oral Daily  . memantine  28 mg Oral Daily  . metoprolol succinate  25 mg Oral Daily  . mometasone-formoterol  2 puff Inhalation BID  . PARoxetine  10 mg Oral Daily  . pravastatin  40 mg Oral q1800  . pregabalin  50 mg Oral BID  . regadenoson      . sodium chloride  3 mL Intravenous Q12H   Continuous Infusions: . sodium chloride 1,000 mL (03/17/15 0829)  . [START ON 03/18/2015] sodium chloride      Principal Problem:   Chest pain Active Problems:   Fibromyalgia   DM2 (diabetes mellitus, type 2)   Essential hypertension, benign   Coronary atherosclerosis of native coronary artery   Chronic systolic heart failure   COPD (chronic obstructive pulmonary disease)   Tobacco use disorder   Cardiomyopathy, ischemic   Allergy to ACE inhibitors   Intolerance of drug   Ventricular tachycardia   [name], [facility] Pager [phone]. If 7PM-7AM, please contact night-coverage at [email or web address], password [facility] 03/17/2015, 7:46 PM

## 2015-03-17 NOTE — H&P (Signed)
[facility] History and Physical  [name] KYH:[id number] DOB: 29-Jan-1941 DOA: 03/16/2015  PCP: Vidal Schwalbe, MD  Specialists: Angelena Form, cardiology  Chief Complaint: Chest pain  HPI: [name] is a 74 y.o. woman with a history of CAD, prior STEMI, chronic systolic heart failure (ischemic cardiomyopathy), HTN, DM, and HLD who was in her baseline state of health until the day of admission.  She was moving around her house, not engaging in any particularly strenuous activity when she developed the acute onset of left side (pectoral region) chest pain that radiated to her left arm.  The pain lasted for several minutes.  It was associated with mild shortness of breath and light-headedness.  No syncope.  No diaphoresis.  She called 911 and was treated with aspirin and one sublingual nitroglycerin tablet.  Her chest pain resolved prior to arrival to the ED.  She cannot recall the last time that she saw her cardiologist.  Initial EKG negative for acute ST segment changes.  Initial troponin negative.  Hospitalist asked to admit for observation.  Heart score at least 5 based on age, risk factors, and slightly suspicious story.  Review of Systems: 10 systems reviewed and negative except as stated in HPI.  Past Medical History  Diagnosis Date  . CAD (coronary artery disease)     a. s/p inferior STEMI 12/29/10 with rotablator atherectomy RCA 12/31/10 and DES x 2 RCA;  b. Lexiscan Myoview (11/2013):  Low risk study, no ischemia, inferolateral defect likely consistent with attenuation versus scar.  . Ischemic cardiomyopathy     EF 45%cath 2012 EF55% 5/12; 35% 11/14  . Diabetes mellitus     type 2  . HTN (hypertension)   . Hyperlipidemia   . Diverticulosis   . Respiratory arrest//ACE Inhibitor presumed cause   . COPD (chronic obstructive pulmonary disease)   . Small bowel obstruction     from a sigmoid stricture  . Nephrolithiasis   . Sleep apnea   . Overweight(278.02)   .  Osteoarthritis   . GERD (gastroesophageal reflux disease)   . Anxiety   . Glaucoma   . Memory deficit   . Dementia   The patient does not wear CPAP.  Past Surgical History  Procedure Laterality Date  . Bilateral tubal ligation    . Lithotripsy    . Lap sigmoid colectomy with repair of colovesical fistula  07/31/2008   Social History:  History   Social History Narrative   Patient is widowed with 2 children.   Patient is right handed.   Patient has 11 th grade education.  Active tobacco use, 1ppd.  Rare EtOH use now.  No illicit drug use.  She lives alone.  Her daughter Benjamine Mola lives in town.  Her other daughter lives in [place].  Allergies  Allergen Reactions  . Lisinopril Other (See Comments)    REACTION: respiratory arrest jan 2009  . Spironolactone Other (See Comments)    PATIENT HAD SEVERE SIDE EFFECTS  . Donepezil Diarrhea  . Eggs Or Egg-Derived Products Diarrhea  . Erythromycin Other (See Comments)    Makes me feel weird   . Iodine Other (See Comments)    Pt may be allergic to iodine, does not remember the details, refuses contrast-CS  . Macrolides And Ketolides Other (See Comments)  . Penicillins Itching and Other (See Comments)    Too much as a kid  . Quinapril Hcl Itching and Other (See Comments)    Too much AS A KID  .  Angiotensin Receptor Blockers Other (See Comments)    unknown  . Contrast Media [Iodinated Diagnostic Agents] Other (See Comments)    UNKNOWN  . Pentazocine Lactate Other (See Comments)    Reaction unknown  . Propoxyphene Hcl Other (See Comments)    Reaction unknown  . Sulfonamide Derivatives Other (See Comments)    Childhood allergy    Family History  Problem Relation Age of Onset  . Breast cancer Maternal Grandmother   . Diabetes Father   . Heart disease Father   . Diabetes Sister   . Colon cancer Neg Hx    Prior to Admission medications   Medication Sig Start Date End Date Taking? Authorizing Provider  albuterol  (PROVENTIL) (2.5 MG/3ML) 0.083% nebulizer solution Take 2.5 mg by nebulization every 6 (six) hours as needed for wheezing or shortness of breath.    Yes Historical Provider, MD  aspirin EC 81 MG tablet Take 81 mg by mouth daily.   Yes Historical Provider, MD  clopidogrel (PLAVIX) 75 MG tablet TAKE 1 TABLET BY MOUTH EVERY DAY 11/11/14  Yes Burnell Blanks, MD  furosemide (LASIX) 20 MG tablet Take 1 tablet (20 mg total) by mouth as needed. 10/28/13  Yes Scott T Kathlen Mody, PA-C  insulin glargine (LANTUS) 100 UNIT/ML injection Inject 0.1 mLs (10 Units total) into the skin daily. 01/25/14  Yes Hosie Poisson, MD  insulin lispro (HUMALOG) 100 UNIT/ML injection Inject 0-10 Units into the skin 3 (three) times daily before meals. Sliding scale: below 150=0 units, 150-200=2 units, 201-250=4 units, 251-300=6 units, 301-350=8 units, above 350=10 units   Yes Historical Provider, MD  ipratropium (ATROVENT) 0.02 % nebulizer solution Take 0.5 mg by nebulization 4 (four) times daily. Four times daily 10/30/13  Yes [name] S Parrett, NP  isosorbide mononitrate (IMDUR) 30 MG 24 hr tablet Take 1 tablet (30 mg total) by mouth daily. 10/14/13  Yes Donita Brooks, NP  LORazepam (ATIVAN) 0.5 MG tablet Take 0.5 mg by mouth 2 (two) times daily as needed for anxiety.   Yes Historical Provider, MD  metFORMIN (GLUCOPHAGE) 500 MG tablet Take 2,000 mg by mouth daily before supper. Taking 4 pills at dinner   Yes Historical Provider, MD  metoprolol succinate (TOPROL-XL) 25 MG 24 hr tablet Take 1 tablet (25 mg total) by mouth daily. 10/14/13  Yes Donita Brooks, NP  mometasone-formoterol (DULERA) 100-5 MCG/ACT AERO Inhale 2 puffs into the lungs 2 (two) times daily.   Yes Historical Provider, MD  NAMENDA XR 28 MG CP24 24 hr capsule TAKE ONE CAPSULE BY MOUTH EVERY DAY 02/08/15  Yes Megan P Millikan, NP  nystatin cream (MYCOSTATIN) Apply 1 application topically as needed for dry skin.   Yes Historical Provider, MD  PARoxetine (PAXIL) 10 MG  tablet Take 10 mg by mouth daily.   Yes Historical Provider, MD  pravastatin (PRAVACHOL) 40 MG tablet TAKE 1 TABLET (40 MG TOTAL) BY MOUTH DAILY. <PLEASE MAKE APPOINTMENT FOR REFILLS> 12/25/14  Yes Burnell Blanks, MD  pregabalin (LYRICA) 50 MG capsule Take 50 mg by mouth 2 (two) times daily.   Yes Historical Provider, MD  albuterol (PROVENTIL HFA;VENTOLIN HFA) 108 (90 BASE) MCG/ACT inhaler Inhale 2 puffs into the lungs every 6 (six) hours as needed for wheezing.    Historical Provider, MD  chlorpheniramine-HYDROcodone (TUSSIONEX) 10-8 MG/5ML LQCR Take 5 mLs by mouth every 12 (twelve) hours as needed for cough. 01/01/15   Simbiso Ranga, MD  levofloxacin (LEVAQUIN) 500 MG tablet Take 1 tablet (500 mg total)  by mouth daily. 01/01/15   Simbiso Ranga, MD  nitroGLYCERIN (NITROSTAT) 0.4 MG SL tablet Place 1 tablet (0.4 mg total) under the tongue every 5 (five) minutes as needed. For chest pain. 06/18/13   Burnell Blanks, MD  senna-docusate (SENOKOT-S) 8.6-50 MG per tablet Take 2 tablets by mouth at bedtime. Hold for diarrhea Patient taking differently: Take 2 tablets by mouth at bedtime as needed for moderate constipation. Hold for diarrhea 10/14/13   Donita Brooks, NP   Physical Exam: Filed Vitals:   03/16/15 2245 03/16/15 2300 03/16/15 2315 03/16/15 2351  BP: 133/51 126/63 144/63 150/65  Pulse: 69 65 76 73  Temp:    97.9 F (36.6 C)  TempSrc:    Oral  Resp: 24 23 17 18   Height:    5\' 5"  (1.651 m)  Weight:    71.986 kg (158 lb 11.2 oz)  SpO2: 93% 92% 91% 92%     General:  Awake and alert.  NAD.  Eyes: PERRL bilaterally, conjunctiva are pink.  EOMI.  ENT: Moist mucous membranes.  No nasal drainage.  Neck: Supple.  No carotid bruit.   Cardiovascular: NR/RR.  No LE edema.  Respiratory: CTA bilaterally.  Abdomen: Soft/NT/ND.  Bowel sounds are present.  No guarding.  Skin: Warm and dry.  Musculoskeletal: Moves all four extremities spontaneously.  Psychiatric: Normal  affect.  Neurologic: No focal deficits.    Labs on Admission:  Basic Metabolic Panel:  Recent Labs Lab 03/16/15 1957  NA 136  K 3.8  CL 98  CO2 26  GLUCOSE 247*  BUN 20  CREATININE 0.87  CALCIUM 9.1   CBC:  Recent Labs Lab 03/16/15 1957  WBC 10.7*  NEUTROABS 7.5  HGB 14.6  HCT 43.0  MCV 94.5  PLT 178   Cardiac Enzymes: First troponin in ED negative.  BNP (last 3 results)  Recent Labs  12/30/14 2354 03/16/15 1957  BNP 95.6 107.2*    CBG:  Recent Labs Lab 03/16/15 2349  GLUCAP 304*    Radiological Exams on Admission: Dg Chest 2 View  03/16/2015   CLINICAL DATA:  74 year old female with chest pain radiating into the left arm and shoulder  EXAM: CHEST  2 VIEW  COMPARISON:  Prior chest x-ray 12/31/2014  FINDINGS: Stable cardiac and mediastinal contours with borderline cardiomegaly. Atherosclerotic calcification present in the transverse aorta. Stable appearance of the left lung base secondary to left heart enlargement and a prominent pericardial fat pad. No evidence of pulmonary edema, new focal airspace consolidation, pleural effusion or pneumothorax. Metallic coronary stent overlies the right heart. No acute osseous abnormality. Mild multilevel degenerative disc disease.  IMPRESSION: Stable chest x-ray without evidence of acute cardiopulmonary process.   Electronically Signed   By: Jacqulynn Cadet M.D.   On: 03/16/2015 21:30    EKG: Independently reviewed. NSR, no acute ST segment changes.  Assessment/Plan Principal Problem:   Chest pain Active Problems:   DM2 (diabetes mellitus, type 2)   Essential hypertension, benign   Coronary atherosclerosis of native coronary artery   Chronic systolic heart failure   COPD (chronic obstructive pulmonary disease)   Tobacco use disorder   1. Chest pain with known history and multiple risk factors --Admit to telemetry --Serial troponin --Echo ordered for AM --NPO status, defer decision for stress test to AM  attending.  Consider consulting her cardiologist in AM --Already on aspirin, Beta Blocker, statin --Currently chest pain free and hemodynamically stable  2.  DM --Sliding scale insulin coverage  3.  Active tobacco use and history of COPD --Continue home inhalers --Albuterol/atrovent nebs PRN  4.  Chronic systolic heart failure --Appears compensated at this point --Echo ordered for AM  Code Status: FULL  Time spent: 60 minutes  [employer] [facility]  03/17/2015, 1:55 AM

## 2015-03-17 NOTE — Consult Note (Signed)
CARDIOLOGY CONSULT NOTE   Patient ID: [name] MRN: [id number] DOB/AGE: Jun 23, 1941 74 y.o.  Admit Date: 03/16/2015 Primary Physician: Kathleen Schwalbe, MD Primary Cardiologist    [facility]   Clinical Summary [name] is a 74 y.o.female   The patient was admitted with some chest discomfort. There is no EKG change. Troponins are negative 3. The following paragraph is copied from the last office note from Dr. Angelena [name]. It represents the best summary of her cardiac evaluation up to this point.   74 yo female with history of DM2, HTN, HL, tobacco abuse and CAD here today for cardiac follow up. She was admitted February 2012 with an acute inferior STEMI. Emergent cath 12/28/10 with serial lesions in subtotally occluded RCA. We were unable to stent the vessel during the index procedure because of severe calcification in the mid vessel. Flow was established with balloon inflations and she was treated with Integrilin, Effient and ASA. She had staged PCI 12/31/10 at which time a rotablator atherectomy of the mid RCA was performed. She did well with both procedures. Two overlapping drug eluting stents were placed in the mid RCA. EF was found to be 45 % at the time of the cath. There was mild disease in the LAD and the Circumflex arteries. Echo 5/12: EF 08-14%, grade 1 diastolic dysfunction, mild MR, mild LAE. Complaint of palpitations 10/30/12. 21 day monitor showed no arrythmias. Admitted to [facility] 11/21-11/25/14 with acute systolic heart failure in the setting of acute exacerbation of COPD. Cardiac enzymes were negative. Echocardiogram (10/10/13): EF 30-35%, diffuse HK with moderate inferior HK, grade 1 diastolic dysfunction, mild MR. Patient was seen by cardiology and diuresed. She was not placed on ACE inhibitor or ARB given prior history of respiratory arrest with ACE inhibitor therapy. She was placed on spironolactone. She was placed on long-acting beta blocker. Long-acting nitrates were added. Dr.  Caryl [name] saw the patient in the hospital and did recommend considering outpatient stress testing to rule out ischemia as a cause of her reduced ejection fraction. Stress myoview 12/04/13 with no ischemia, LVEF not calculated. (low risk study). There was also some thought given to whether or not an implantable loop recorder should be placed to rule out the possibility of atrial fibrillation (negative monitor in the recent past) with her history of palpitations but ultimately this was not felt to be necessary. Dr. Caryl [name] recommended considering an ILR to rule out AFib if she has recurrent palpitations. She did not tolerate spironolactone. Started on prn Lasix when seen here on 10/28/13 by Kathleen Dopp, PA-C.   There has been no further cardiac evaluation since January, 2015. It was noted at that time that the patient has had a reaction to an ACE inhibitor that was significant. Therefore she cannot receive an ACE or an ARB. In addition, she did not tolerate spironolactone. Therefore this has not been used.  The patient is currently here with some chest discomfort. There is no diagnostic EKG change. Troponins are negative 3. Her telemetry does reveal 6 beats of ventricular tachycardia with a rate of 150. This occurred earlier this morning.   Allergies  Allergen Reactions  . Lisinopril Other (See Comments)    REACTION: respiratory arrest jan 2009  . Spironolactone Other (See Comments)    PATIENT HAD SEVERE SIDE EFFECTS  . Donepezil Diarrhea  . Eggs Or Egg-Derived Products Diarrhea  . Erythromycin Other (See Comments)    Makes me feel weird   . Iodine Other (See Comments)  Pt may be allergic to iodine, does not remember the details, refuses contrast-CS  . Macrolides And Ketolides Other (See Comments)  . Penicillins Itching and Other (See Comments)    Too much as a kid  . Quinapril Hcl Itching and Other (See Comments)    Too much AS A KID  . Angiotensin Receptor Blockers Other (See Comments)     unknown  . Contrast Media [Iodinated Diagnostic Agents] Other (See Comments)    UNKNOWN  . Pentazocine Lactate Other (See Comments)    Reaction unknown  . Propoxyphene Hcl Other (See Comments)    Reaction unknown  . Sulfonamide Derivatives Other (See Comments)    Childhood allergy    Medications Scheduled Medications: . aspirin EC  81 mg Oral Daily  . clopidogrel  75 mg Oral Daily  . enoxaparin (LOVENOX) injection  40 mg Subcutaneous Q24H  . insulin aspart  0-9 Units Subcutaneous TID WC  . ipratropium  0.5 mg Nebulization QID  . isosorbide mononitrate  30 mg Oral Daily  . memantine  28 mg Oral Daily  . metoprolol succinate  25 mg Oral Daily  . mometasone-formoterol  2 puff Inhalation BID  . PARoxetine  10 mg Oral Daily  . pravastatin  40 mg Oral q1800  . pregabalin  50 mg Oral BID     Infusions:     PRN Medications:  acetaminophen, albuterol, LORazepam, nitroGLYCERIN, ondansetron (ZOFRAN) IV   Past Medical History  Diagnosis Date  . CAD (coronary artery disease)     a. s/p inferior STEMI 12/29/10 with rotablator atherectomy RCA 12/31/10 and DES x 2 RCA;  b. Lexiscan Myoview (11/2013):  Low risk study, no ischemia, inferolateral defect likely consistent with attenuation versus scar.  . Ischemic cardiomyopathy     EF 45%cath 2012 EF55% 5/12; 35% 11/14  . Diabetes mellitus     type 2  . HTN (hypertension)   . Hyperlipidemia   . Diverticulosis   . Respiratory arrest//ACE Inhibitor presumed cause   . COPD (chronic obstructive pulmonary disease)   . Small bowel obstruction     from a sigmoid stricture  . Nephrolithiasis   . Sleep apnea   . Overweight(278.02)   . Osteoarthritis   . GERD (gastroesophageal reflux disease)   . Anxiety   . Glaucoma   . Memory deficit   . Dementia     Past Surgical History  Procedure Laterality Date  . Bilateral tubal ligation    . Lithotripsy    . 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 Cardiac  surgery      Family History  Problem Relation Age of Onset  . Breast cancer Maternal Grandmother   . Diabetes Father   . Heart disease Father   . Diabetes Sister   . Colon cancer Neg Hx     Social History [name] reports that she has been smoking Cigarettes.  She has a 25 pack-year smoking history. She has never used smokeless tobacco. [name] reports that she does not drink alcohol.  Review of Systems Complete review of systems are found to be negative unless outlined in H&P above. The patient denies fever, chills, headache, sweats, rash, change in vision, change in hearing, cough, nausea or vomiting, urinary symptoms. All other systems are reviewed and are negative.  Physical Examination Blood pressure 150/72, pulse 70, temperature 97.8 F (36.6 C), temperature source Oral, resp. rate 18, height 5\' 5"  (1.651 m), weight 158 lb (71.668 kg), SpO2 92 %.  No intake or output data in the 24 hours ending 03/17/15 0800 Patient is comfortable in bed this morning. She has a slight headache. She is oriented to person time and place. Affect is normal. Head is atraumatic. Sclera and conjunctiva are normal. There is no jugular venous distention. Lungs are clear. Respiratory effort is nonlabored. Cardiac exam reveals S1 and S2. Abdomen is soft. There is no peripheral edema. There are no musculoskeletal deformities. There are no skin rashes.  Prior Cardiac Testing/Procedures  Lab Results  Basic Metabolic Panel:  Recent Labs Lab 03/16/15 1957 03/17/15 0311  NA 136  --   K 3.8  --   CL 98  --   CO2 26  --   GLUCOSE 247*  --   BUN 20  --   CREATININE 0.87 0.86  CALCIUM 9.1  --     Liver Function Tests: No results for input(s): AST, ALT, ALKPHOS, BILITOT, PROT, ALBUMIN in the last 168 hours.  CBC:  Recent Labs Lab 03/16/15 1957 03/17/15 0311  WBC 10.7* 9.7  NEUTROABS 7.5  --   HGB 14.6 15.3*  HCT 43.0 45.3  MCV 94.5 95.0  PLT 178 164    Cardiac Enzymes:  Recent  Labs Lab 03/17/15 0311 03/17/15 0530  TROPONINI <0.03 <0.03    BNP: Invalid input(s): POCBNP   Radiology: Dg Chest 2 View  03/16/2015   CLINICAL DATA:  74 year old female with chest pain radiating into the left arm and shoulder  EXAM: CHEST  2 VIEW  COMPARISON:  Prior chest x-ray 12/31/2014  FINDINGS: Stable cardiac and mediastinal contours with borderline cardiomegaly. Atherosclerotic calcification present in the transverse aorta. Stable appearance of the left lung base secondary to left heart enlargement and a prominent pericardial fat pad. No evidence of pulmonary edema, new focal airspace consolidation, pleural effusion or pneumothorax. Metallic coronary stent overlies the right heart. No acute osseous abnormality. Mild multilevel degenerative disc disease.  IMPRESSION: Stable chest x-ray without evidence of acute cardiopulmonary process.   Electronically Signed   By: Jacqulynn Cadet M.D.   On: 03/16/2015 21:30     ECG: I have reviewed the current and prior EKGs. There are no diagnostic changes.  Telemetry:    The patient has had one episode of 6 beats of ventricular tachycardia with a rate of 150. This occurred at 6:50 AM today     Impression and Recommendations    Chest pain     The patient's current chest pain was fleeting. Enzymes are negative. EKGs reveal no change. However she has known disease in the past with interventions done in the past. She also has left ventricular dysfunction. In addition there were 6 beats of ventricular tachycardia on her monitor this morning. Argument could be made to proceed with either stress testing or cardiac catheterization today. At this point I have chosen to proceed with a stress nuclear study today. In addition 2-D echo will be done to reassess LV function. Once we have this information, we can decide on the next step. I would have a very low threshold to proceed with cardiac catheterization as she needs a full workup to make decisions about  her left ventricular dysfunction and ventricular tachycardia.    Fibromyalgia   DM2 (diabetes mellitus, type 2)   Essential hypertension, benign    Coronary atherosclerosis of native coronary artery      It is known that she has significant coronary disease with interventions in the past. She had a nuclear study in January, 2015.  The study was not gated. Therefore ejection fraction could not be estimated at that time. There was some scar. There was no definite ischemia. We will start today with a stress nuclear study. I would have a low threshold to proceed with cardiac catheterization.    Chronic systolic heart failure      Her volume status is stable. No change in therapy today.    COPD (chronic obstructive pulmonary disease)   Tobacco use disorder    Cardiomyopathy, ischemic      Historically her ejection fraction when last assessed by echo was 30-35%. It is documented that she is allergic to ACE inhibitors. She cannot receive an ACE or an ARB. In addition, she does not tolerate spironolactone. Currently she is on metoprolol. It will be appropriate to try to transition her to carvedilol at some point.    Allergy to ACE inhibitors      With this documented history, ACE inhibitors and ARB's will not be used.    Intolerance of drug     The patient is intolerant to spironolactone. Therefore it will not be tried again for her cardiomyopathy.    Ventricular tachycardia     The patient had 6 beats of ventricular tachycardia on her monitor today with a rate of 150. She has known left ventricular dysfunction. She will need further assessment as to whether or not an ICD should be placed. We will start today with a repeat echo to reassess LV function and a nuclear stress study. Decision will then be made as to whether or not cardiac catheterization should be done. Decision can also be made as to further electrophysiology evaluation concerning whether she needs consideration for an ICD.  Daryel November,  MD 03/17/2015, 8:00 AM

## 2015-03-17 NOTE — Progress Notes (Signed)
UR completed 

## 2015-03-18 ENCOUNTER — Encounter (HOSPITAL_COMMUNITY)
Admission: EM | Disposition: A | Discharge status: Home / Self Care | Payer: Self-pay | Source: Home / Self Care | Attending: Internal Medicine

## 2015-03-18 ENCOUNTER — Encounter (HOSPITAL_COMMUNITY): Payer: Self-pay | Admitting: Cardiovascular Disease

## 2015-03-18 DIAGNOSIS — Z720 Tobacco use: Secondary | ICD-10-CM | POA: Diagnosis not present

## 2015-03-18 DIAGNOSIS — Z7902 Long term (current) use of antithrombotics/antiplatelets: Secondary | ICD-10-CM | POA: Diagnosis not present

## 2015-03-18 DIAGNOSIS — M797 Fibromyalgia: Secondary | ICD-10-CM | POA: Diagnosis not present

## 2015-03-18 DIAGNOSIS — Z9851 Tubal ligation status: Secondary | ICD-10-CM | POA: Diagnosis not present

## 2015-03-18 DIAGNOSIS — F0390 Unspecified dementia without behavioral disturbance: Secondary | ICD-10-CM | POA: Diagnosis present

## 2015-03-18 DIAGNOSIS — I252 Old myocardial infarction: Secondary | ICD-10-CM | POA: Diagnosis not present

## 2015-03-18 DIAGNOSIS — F17210 Nicotine dependence, cigarettes, uncomplicated: Secondary | ICD-10-CM | POA: Diagnosis present

## 2015-03-18 DIAGNOSIS — I5022 Chronic systolic (congestive) heart failure: Secondary | ICD-10-CM

## 2015-03-18 DIAGNOSIS — Z91041 Radiographic dye allergy status: Secondary | ICD-10-CM | POA: Diagnosis not present

## 2015-03-18 DIAGNOSIS — Z881 Allergy status to other antibiotic agents status: Secondary | ICD-10-CM | POA: Diagnosis not present

## 2015-03-18 DIAGNOSIS — M1990 Unspecified osteoarthritis, unspecified site: Secondary | ICD-10-CM | POA: Diagnosis present

## 2015-03-18 DIAGNOSIS — R072 Precordial pain: Secondary | ICD-10-CM | POA: Diagnosis not present

## 2015-03-18 DIAGNOSIS — Z880 Allergy status to penicillin: Secondary | ICD-10-CM | POA: Diagnosis not present

## 2015-03-18 DIAGNOSIS — F419 Anxiety disorder, unspecified: Secondary | ICD-10-CM | POA: Diagnosis present

## 2015-03-18 DIAGNOSIS — E119 Type 2 diabetes mellitus without complications: Secondary | ICD-10-CM | POA: Diagnosis present

## 2015-03-18 DIAGNOSIS — E785 Hyperlipidemia, unspecified: Secondary | ICD-10-CM | POA: Diagnosis present

## 2015-03-18 DIAGNOSIS — J438 Other emphysema: Secondary | ICD-10-CM | POA: Diagnosis not present

## 2015-03-18 DIAGNOSIS — Z882 Allergy status to sulfonamides status: Secondary | ICD-10-CM | POA: Diagnosis not present

## 2015-03-18 DIAGNOSIS — Z888 Allergy status to other drugs, medicaments and biological substances status: Secondary | ICD-10-CM | POA: Diagnosis not present

## 2015-03-18 DIAGNOSIS — Z955 Presence of coronary angioplasty implant and graft: Secondary | ICD-10-CM | POA: Diagnosis not present

## 2015-03-18 DIAGNOSIS — R079 Chest pain, unspecified: Secondary | ICD-10-CM | POA: Diagnosis present

## 2015-03-18 DIAGNOSIS — G4730 Sleep apnea, unspecified: Secondary | ICD-10-CM | POA: Diagnosis present

## 2015-03-18 DIAGNOSIS — E876 Hypokalemia: Secondary | ICD-10-CM | POA: Diagnosis present

## 2015-03-18 DIAGNOSIS — I255 Ischemic cardiomyopathy: Secondary | ICD-10-CM | POA: Diagnosis present

## 2015-03-18 DIAGNOSIS — J449 Chronic obstructive pulmonary disease, unspecified: Secondary | ICD-10-CM | POA: Diagnosis present

## 2015-03-18 DIAGNOSIS — I2510 Atherosclerotic heart disease of native coronary artery without angina pectoris: Secondary | ICD-10-CM

## 2015-03-18 DIAGNOSIS — I10 Essential (primary) hypertension: Secondary | ICD-10-CM | POA: Diagnosis present

## 2015-03-18 DIAGNOSIS — Z7982 Long term (current) use of aspirin: Secondary | ICD-10-CM | POA: Diagnosis not present

## 2015-03-18 DIAGNOSIS — Z889 Allergy status to unspecified drugs, medicaments and biological substances status: Secondary | ICD-10-CM

## 2015-03-18 DIAGNOSIS — Z91012 Allergy to eggs: Secondary | ICD-10-CM | POA: Diagnosis not present

## 2015-03-18 DIAGNOSIS — K219 Gastro-esophageal reflux disease without esophagitis: Secondary | ICD-10-CM | POA: Diagnosis present

## 2015-03-18 DIAGNOSIS — I472 Ventricular tachycardia: Secondary | ICD-10-CM | POA: Diagnosis present

## 2015-03-18 DIAGNOSIS — Z794 Long term (current) use of insulin: Secondary | ICD-10-CM | POA: Diagnosis not present

## 2015-03-18 HISTORY — PX: CARDIAC CATHETERIZATION: SHX172

## 2015-03-18 LAB — BASIC METABOLIC PANEL
ANION GAP: 8 (ref 5–15)
BUN: 15 mg/dL (ref 6–23)
CALCIUM: 8.5 mg/dL (ref 8.4–10.5)
CO2: 28 mmol/L (ref 19–32)
Chloride: 103 mmol/L (ref 96–112)
Creatinine, Ser: 0.8 mg/dL (ref 0.50–1.10)
GFR, EST AFRICAN AMERICAN: 83 mL/min — AB (ref >90)
GFR, EST NON AFRICAN AMERICAN: 71 mL/min — AB (ref >90)
Glucose, Bld: 159 mg/dL — ABNORMAL HIGH (ref 70–99)
POTASSIUM: 3.4 mmol/L — AB (ref 3.5–5.1)
SODIUM: 139 mmol/L (ref 135–145)

## 2015-03-18 LAB — CBC
HEMATOCRIT: 41 % (ref 36.0–46.0)
Hemoglobin: 13.4 g/dL (ref 12.0–15.0)
MCH: 31.1 pg (ref 26.0–34.0)
MCHC: 32.7 g/dL (ref 30.0–36.0)
MCV: 95.1 fL (ref 78.0–100.0)
PLATELETS: 169 10*3/uL (ref 150–400)
RBC: 4.31 MIL/uL (ref 3.87–5.11)
RDW: 12.8 % (ref 11.5–15.5)
WBC: 8.5 10*3/uL (ref 4.0–10.5)

## 2015-03-18 LAB — PROTIME-INR
INR: 1.07 (ref 0.00–1.49)
Prothrombin Time: 14 seconds (ref 11.6–15.2)

## 2015-03-18 LAB — GLUCOSE, CAPILLARY
GLUCOSE-CAPILLARY: 243 mg/dL — AB (ref 70–99)
Glucose-Capillary: 163 mg/dL — ABNORMAL HIGH (ref 70–99)
Glucose-Capillary: 144 mg/dL — ABNORMAL HIGH (ref 70–99)
Glucose-Capillary: 136 mg/dL — ABNORMAL HIGH (ref 70–99)

## 2015-03-18 SURGERY — LEFT HEART CATH AND CORONARY ANGIOGRAPHY

## 2015-03-18 MED ORDER — MORPHINE SULFATE 2 MG/ML IJ SOLN: 2.0000 mg | INTRAMUSCULAR | Status: DC | PRN

## 2015-03-18 MED ORDER — POTASSIUM CHLORIDE CRYS ER 20 MEQ PO TBCR
40.0000 meq | EXTENDED_RELEASE_TABLET | Freq: Once | ORAL | Status: AC
  Administered 2015-03-18: 40 meq via ORAL
  Filled 2015-03-18: qty 2

## 2015-03-18 MED ORDER — ACETAMINOPHEN 325 MG PO TABS: 650.0000 mg | ORAL_TABLET | ORAL | Status: DC | PRN

## 2015-03-18 MED ORDER — ONDANSETRON HCL 4 MG/2ML IJ SOLN: 4.0000 mg | Freq: Four times a day (QID) | INTRAMUSCULAR | Status: DC | PRN

## 2015-03-18 MED ORDER — SODIUM CHLORIDE 0.9 % IV SOLN
INTRAVENOUS | Status: AC
  Administered 2015-03-18: 16:00:00 via INTRAVENOUS

## 2015-03-18 MED ORDER — METOPROLOL SUCCINATE ER 25 MG PO TB24
25.0000 mg | ORAL_TABLET | Freq: Two times a day (BID) | ORAL | Status: DC
  Administered 2015-03-18 – 2015-03-20 (×4): 25 mg via ORAL
  Filled 2015-03-18 (×4): qty 1

## 2015-03-18 MED ORDER — LIDOCAINE HCL (PF) 1 % IJ SOLN
INTRAMUSCULAR | Status: AC
  Filled 2015-03-18: qty 30

## 2015-03-18 MED ORDER — ASPIRIN 81 MG PO CHEW: 81.0000 mg | CHEWABLE_TABLET | Freq: Every day | ORAL | Status: DC

## 2015-03-18 MED ORDER — HEPARIN (PORCINE) IN NACL 2-0.9 UNIT/ML-% IJ SOLN
INTRAMUSCULAR | Status: AC
  Filled 2015-03-18: qty 1500

## 2015-03-18 NOTE — Progress Notes (Signed)
Patient Name: [name] Date of Encounter: 03/18/2015  Principal Problem:   Chest pain Active Problems:   Fibromyalgia   DM2 (diabetes mellitus, type 2)   Essential hypertension, benign   Coronary atherosclerosis of native coronary artery   Chronic systolic heart failure   COPD (chronic obstructive pulmonary disease)   Tobacco use disorder   Cardiomyopathy, ischemic   Allergy to ACE inhibitors   Intolerance of drug   Ventricular tachycardia   Primary Cardiologist: Dr Angelena Form  Patient Profile: 74 yo female w/ hx DM, HTN, HL, tob use, CAD was admitted 04/27 w/ chest pain. MV abnl, for cath 04/28.  SUBJECTIVE: Feels tired, no chest pain, SOB is at baseline, congested cough is nl for her.  OBJECTIVE Filed Vitals:   03/17/15 2027 03/17/15 2219 03/18/15 0536 03/18/15 0733  BP: 121/60  137/67   Pulse: 70 62 66   Temp: 98.2 F (36.8 C)  97.5 F (36.4 C)   TempSrc: Oral  Oral   Resp: 20 18 18    Height:      Weight:   160 lb (72.576 kg)   SpO2: 93% 92% 94% 91%   No intake or output data in the 24 hours ending 03/18/15 1011 Filed Weights   03/16/15 2351 03/17/15 0601 03/18/15 0536  Weight: 158 lb 11.2 oz (71.986 kg) 158 lb (71.668 kg) 160 lb (72.576 kg)    PHYSICAL EXAM General: Well developed, well nourished, female in no acute distress. Head: Normocephalic, atraumatic.  Neck: Supple without bruits, JVD not elevated. Lungs:  Resp regular and unlabored, rales bases. Heart: RRR, S1, S2, no S3, S4, 2/6 murmur; no rub. Abdomen: Soft, non-tender, non-distended, BS + x 4.  Extremities: No clubbing, cyanosis, no edema.  Neuro: Alert and oriented X 3. Moves all extremities spontaneously. Psych: Normal affect.  LABS: CBC: Recent Labs  03/16/15 1957 03/17/15 0311 03/18/15 0324  WBC 10.7* 9.7 8.5  NEUTROABS 7.5  --   --   HGB 14.6 15.3* 13.4  HCT 43.0 45.3 41.0  MCV 94.5 95.0 95.1  PLT 178 164 169   INR: Recent Labs  03/18/15 0324  INR 0.86   Basic  Metabolic Panel: Recent Labs  03/16/15 1957 03/17/15 0311 03/18/15 0324  NA 136  --  139  K 3.8  --  3.4*  CL 98  --  103  CO2 26  --  28  GLUCOSE 247*  --  159*  BUN 20  --  15  CREATININE 0.87 0.86 0.80  CALCIUM 9.1  --  8.5   Cardiac Enzymes: Recent Labs  03/17/15 0311 03/17/15 0530 03/17/15 0900  TROPONINI <0.03 <0.03 <0.03    Recent Labs  03/16/15 2002  TROPIPOC 0.00   BNP:  B NATRIURETIC PEPTIDE  Date/Time Value Ref Range Status  03/16/2015 07:57 PM 107.2* 0.0 - 100.0 pg/mL Final  12/30/2014 11:54 PM 95.6 0.0 - 100.0 pg/mL Final   TELE: SR, 6 bt run NSVT, asymptomatic       Radiology/Studies: Dg Chest 2 View 03/16/2015   CLINICAL DATA:  74 year old female with chest pain radiating into the left arm and shoulder  EXAM: CHEST  2 VIEW  COMPARISON:  Prior chest x-ray 12/31/2014  FINDINGS: Stable cardiac and mediastinal contours with borderline cardiomegaly. Atherosclerotic calcification present in the transverse aorta. Stable appearance of the left lung base secondary to left heart enlargement and a prominent pericardial fat pad. No evidence of pulmonary edema, new focal airspace consolidation, pleural effusion or  pneumothorax. Metallic coronary stent overlies the right heart. No acute osseous abnormality. Mild multilevel degenerative disc disease.  IMPRESSION: Stable chest x-ray without evidence of acute cardiopulmonary process.   Electronically Signed   By: Jacqulynn Cadet M.D.   On: 03/16/2015 21:30   Nm Myocar Multi W/spect W/wall Motion / Ef 03/17/2015   CLINICAL DATA:  Chest pain  EXAM: Lexiscan Myovue  TECHNIQUE: The patient received IV Lexiscan .4mg  over 15 seconds. 33.0 mCi of Technetium 69m Sestamibi injected at 30 seconds. Quantitative SPECT images were obtained in the vertical, horizontal and short axis planes after a 45 minute delay. Rest images were obtained with similar planes and delay using 10.2 mCi of Technetium 66m Sestamibi.  FINDINGS: ECG:  NSR, no  change with Lexiscan infusion.  Quantitiative Gated SPECT EF:  47%, diffuse hypokinesis.  Perfusion Images: There was a small, mild apical anterior perfusion defect with Lexiscan stress. No significant perfusion defect at rest.  IMPRESSION: EF 47% with small, mild reversible apical anterior perfusion defect that may be indicative of a small area of ischemia. I would rate this an intermediate risk study given combination of decreased EF and small reversible perfusion defect.  [name]   Electronically Signed   By: Loralie Champagne M.D.   On: 03/17/2015 15:38     Current Medications:  . [START ON 03/19/2015] aspirin EC  81 mg Oral Daily  . clopidogrel  75 mg Oral Daily  . enoxaparin (LOVENOX) injection  40 mg Subcutaneous Q24H  . insulin aspart  0-9 Units Subcutaneous TID WC  . ipratropium  0.5 mg Nebulization QID  . isosorbide mononitrate  30 mg Oral Daily  . memantine  28 mg Oral Daily  . metoprolol succinate  25 mg Oral Daily  . mometasone-formoterol  2 puff Inhalation BID  . PARoxetine  10 mg Oral Daily  . potassium chloride  40 mEq Oral Once  . pravastatin  40 mg Oral q1800  . pregabalin  50 mg Oral BID  . sodium chloride  3 mL Intravenous Q12H   . sodium chloride      ASSESSMENT AND PLAN: Principal Problem:   Chest pain - ez neg MI, MV + ischemia - for cath today - with LVD, will only give 50 cc/hr - on ASA, BB, statin, Plavix    Ventricular tachycardia  - hx palpitations, ILR considered in the past after event monitor showed nothing - asymptomatic, on BB - will increase to bid - EF too high to consider ICD    Hypokalemia - will give 40 meq x 1 - recheck w/ Mg in am  Otherwise, per IM Active Problems:   Fibromyalgia   DM2 (diabetes mellitus, type 2)   Essential hypertension, benign   Coronary atherosclerosis of native coronary artery   Chronic systolic heart failure   COPD (chronic obstructive pulmonary disease)   Tobacco use disorder   Cardiomyopathy,  ischemic   Allergy to ACE inhibitors   Intolerance of drug  Signed, Rosaria Ferries , PA-C 10:11 AM 03/18/2015  Patient seen, examined. Available data reviewed. Agree with findings, assessment, and plan as outlined by Rosaria Ferries, PA-C. The patient is independently interviewed and examined. Lung fields are clear, heart is regular rate and rhythm without murmur, there is no peripheral edema. Lab work, radiographic data, and EKGs reviewed. The patient has symptoms concerning for unstable angina pectoris. There is no obvious objective evidence of ischemia or infarction. Plans noted for cardiac catheterization.  I have reviewed the risks, indications,  and alternatives to cardiac catheterization and possible PCI with the patient. Risks include but are not limited to bleeding, infection, vascular injury, stroke, myocardial infection, arrhythmia, kidney injury, radiation-related injury in the case of prolonged fluoroscopy use, emergency cardiac surgery, and death. The patient understands the risks of serious complication is low (<2%).   Otherwise will continue current medical therapies which include aspirin, clopidogrel, isosorbide, metoprolol succinate, and pravastatin. All questions were answered.  Sherren Mocha, M.D. 03/18/2015 1:26 PM

## 2015-03-18 NOTE — CV Procedure (Signed)
[name] is a 74 y.o. female    [id number] LOCATION:  FACILITY: [facility]  PHYSICIAN: Quay Burow, M.D. Jun 11, 1941   DATE OF PROCEDURE:  03/18/2015  DATE OF DISCHARGE:     CARDIAC CATHETERIZATION     History obtained from chart review.74 yo female w/ hx DM, HTN, HL, tob use, CAD was admitted 04/27 w/ chest pain. MV abnl, for cath 04/28. She has a history of CAD status post inferior STEMI in 2012 she did with high-speed rotational atherectomy, PCI and stenting with minimal LAD and circumflex disease. She presents now for chronic catheterization to define her anatomy and rule out an ischemic etiology.   PROCEDURE DESCRIPTION:   The patient was brought to the second floor [facility] cath lab in the postabsorptive state. She was premedicated with Valium 5 mg by mouth, IV Versed and fentanyl. Her right groinwas prepped and shaved in usual sterile fashion. Xylocaine 1% was used for local anesthesia. A 5 French sheath was inserted into the right common femoral artery using standard Seldinger technique. 5 French right and left Judkins diagnostic catheters along with a 5 French pigtail catheter were used for selective coronary angiography and left ventriculography respectively. Visipaque dye was used for the entirety of the case. Retrograde aortic, left ventricular and pullback pressures were recorded.   HEMODYNAMICS:    AO SYSTOLIC/AO DIASTOLIC: 030/09   LV SYSTOLIC/LV DIASTOLIC: 233/00  ANGIOGRAPHIC RESULTS:   1. Left main; normal  2. LAD; 50% proximal hypodense lesion  at the first septal perforator which did not appear to be hemogram is significant 3. Left circumflex; free of significant disease.  4. Right coronary artery; dominant with widely patent stents and no evidence of luminal narrowing 5. Left ventriculography; RAO left ventriculogram was performed using  25 mL of Visipaque dye at 12 mL/second. The overall LVEF estimated  45-50 %  With wall motion abnormalities  mild to moderate inferior hypokinesia  IMPRESSION:[name] has noncritical CAD with at most a moderate proximal LAD lesion in the 50% range and did not appear to be hemogram and a significant. Her stented RCA is widely patent she has mild LV dysfunction with an inferior wall motion under Melanie probably residual from her prior inferior STEMI 12 continue medical therapy will be recommended. A femoral intramedullary performed and a MYNX closure device was used to obtain hemostasis. The patient left the lab in stable condition.  Lorretta Harp MD, [facility] 03/18/2015 3:23 PM

## 2015-03-18 NOTE — Progress Notes (Signed)
UR completed 

## 2015-03-18 NOTE — Progress Notes (Signed)
[facility] PROGRESS NOTE  [name] HYW:[id number] DOB: 11-26-1940 DOA: 03/16/2015 PCP: Vidal Schwalbe, MD  Assessment/Plan: 1. Chest pain 1. Cardiology following 2. Abnormal stress test on 4/27 3. Pt underwent heart cath 4/28 with findings of 50% lesion of LAD, stents patent 4. Currently pain free 2. DM 1. Cont on SSI coverage 3. Tobacco abuse 1. stable 4. COPD 1. No wheezing on exam 2. On bronchodilators and PRN nebs 5. Chronic systolic CHF 1. Stable, appears euvolemic 2. 2d echo with EF of 45% 6. DVT prophylaxis 1. Lovenox subQ 7. Dementia 1. Stable 2. Family at bedside and is concerned that patient is unable to care for herself at home by herself 3. Family is strongly considering placement to SNF 4. Have consulted PT/OT for d/c planning.  Code Status: Full Family Communication: Pt in room Disposition Plan: Pending   Consultants:  Cardiology  Procedures:    Antibiotics:   (indicate start date, and stop date if known)  HPI/Subjective: Pt is without chest pain or sob  Objective: Filed Vitals:   03/18/15 1619 03/18/15 1634 03/18/15 1640 03/18/15 1734  BP: 117/57 116/43  117/61  Pulse: 73 72  80  Temp:      TempSrc:      Resp: 23 17  22   Height:      Weight:      SpO2: 90% 92% 93% 95%    Intake/Output Summary (Last 24 hours) at 03/18/15 1747 Last data filed at 03/18/15 1700  Gross per 24 hour  Intake    120 ml  Output      0 ml  Net    120 ml   Filed Weights   03/16/15 2351 03/17/15 0601 03/18/15 0536  Weight: 71.986 kg (158 lb 11.2 oz) 71.668 kg (158 lb) 72.576 kg (160 lb)    Exam:   General:  Awake, sitting in bed, eating dinner, in nad  Cardiovascular: regular, s1, s2  Respiratory: normal resp effort, no wheezing, no crackles  Abdomen: soft,nondistended  Musculoskeletal: perfused, no clubbing   Data Reviewed: Basic Metabolic Panel:  Recent Labs Lab 03/16/15 1957 03/17/15 0311 03/18/15 0324  NA 136  --  139   K 3.8  --  3.4*  CL 98  --  103  CO2 26  --  28  GLUCOSE 247*  --  159*  BUN 20  --  15  CREATININE 0.87 0.86 0.80  CALCIUM 9.1  --  8.5   Liver Function Tests: No results for input(s): AST, ALT, ALKPHOS, BILITOT, PROT, ALBUMIN in the last 168 hours. No results for input(s): LIPASE, AMYLASE in the last 168 hours. No results for input(s): AMMONIA in the last 168 hours. CBC:  Recent Labs Lab 03/16/15 1957 03/17/15 0311 03/18/15 0324  WBC 10.7* 9.7 8.5  NEUTROABS 7.5  --   --   HGB 14.6 15.3* 13.4  HCT 43.0 45.3 41.0  MCV 94.5 95.0 95.1  PLT 178 164 169   Cardiac Enzymes:  Recent Labs Lab 03/17/15 0311 03/17/15 0530 03/17/15 0900  TROPONINI <0.03 <0.03 <0.03   BNP (last 3 results)  Recent Labs  12/30/14 2354 03/16/15 1957  BNP 95.6 107.2*    ProBNP (last 3 results) No results for input(s): PROBNP in the last 8760 hours.  CBG:  Recent Labs Lab 03/17/15 1649 03/17/15 2053 03/18/15 0732 03/18/15 1135 03/18/15 1618  GLUCAP 197* 162* 163* 144* 136*    No results found for this or any previous visit (from the past 240  hour(s)).   Studies: Dg Chest 2 View  03/16/2015   CLINICAL DATA:  74 year old female with chest pain radiating into the left arm and shoulder  EXAM: CHEST  2 VIEW  COMPARISON:  Prior chest x-ray 12/31/2014  FINDINGS: Stable cardiac and mediastinal contours with borderline cardiomegaly. Atherosclerotic calcification present in the transverse aorta. Stable appearance of the left lung base secondary to left heart enlargement and a prominent pericardial fat pad. No evidence of pulmonary edema, new focal airspace consolidation, pleural effusion or pneumothorax. Metallic coronary stent overlies the right heart. No acute osseous abnormality. Mild multilevel degenerative disc disease.  IMPRESSION: Stable chest x-ray without evidence of acute cardiopulmonary process.   Electronically Signed   By: Jacqulynn Cadet M.D.   On: 03/16/2015 21:30   Nm Myocar  Multi W/spect W/wall Motion / Ef  03/17/2015   CLINICAL DATA:  Chest pain  EXAM: Lexiscan Myovue  TECHNIQUE: The patient received IV Lexiscan .4mg  over 15 seconds. 33.0 mCi of Technetium 32m Sestamibi injected at 30 seconds. Quantitative SPECT images were obtained in the vertical, horizontal and short axis planes after a 45 minute delay. Rest images were obtained with similar planes and delay using 10.2 mCi of Technetium 60m Sestamibi.  FINDINGS: ECG:  NSR, no change with Lexiscan infusion.  Quantitiative Gated SPECT EF:  47%, diffuse hypokinesis.  Perfusion Images: There was a small, mild apical anterior perfusion defect with Lexiscan stress. No significant perfusion defect at rest.  IMPRESSION: EF 47% with small, mild reversible apical anterior perfusion defect that may be indicative of a small area of ischemia. I would rate this an intermediate risk study given combination of decreased EF and small reversible perfusion defect.  [name]   Electronically Signed   By: Loralie Champagne M.D.   On: 03/17/2015 15:38    Scheduled Meds: . [START ON 03/19/2015] aspirin EC  81 mg Oral Daily  . clopidogrel  75 mg Oral Daily  . enoxaparin (LOVENOX) injection  40 mg Subcutaneous Q24H  . insulin aspart  0-9 Units Subcutaneous TID WC  . ipratropium  0.5 mg Nebulization QID  . isosorbide mononitrate  30 mg Oral Daily  . memantine  28 mg Oral Daily  . metoprolol succinate  25 mg Oral BID  . mometasone-formoterol  2 puff Inhalation BID  . PARoxetine  10 mg Oral Daily  . pravastatin  40 mg Oral q1800  . pregabalin  50 mg Oral BID   Continuous Infusions: . sodium chloride 75 mL/hr at 03/18/15 1552    Principal Problem:   Chest pain Active Problems:   Fibromyalgia   DM2 (diabetes mellitus, type 2)   Essential hypertension, benign   Coronary atherosclerosis of native coronary artery   Chronic systolic heart failure   COPD (chronic obstructive pulmonary disease)   Tobacco use disorder   Cardiomyopathy,  ischemic   Allergy to ACE inhibitors   Intolerance of drug   Ventricular tachycardia   Hatsumi Steinhart, [facility] Pager [phone]. If 7PM-7AM, please contact night-coverage at [email or web address], password [facility] 03/18/2015, 5:47 PM

## 2015-03-19 LAB — BASIC METABOLIC PANEL
ANION GAP: 10 (ref 5–15)
BUN: 12 mg/dL (ref 6–23)
CHLORIDE: 105 mmol/L (ref 96–112)
CO2: 25 mmol/L (ref 19–32)
Calcium: 8.8 mg/dL (ref 8.4–10.5)
Creatinine, Ser: 0.85 mg/dL (ref 0.50–1.10)
GFR calc Af Amer: 77 mL/min — ABNORMAL LOW (ref >90)
GFR calc non Af Amer: 66 mL/min — ABNORMAL LOW (ref >90)
Glucose, Bld: 157 mg/dL — ABNORMAL HIGH (ref 70–99)
Potassium: 3.7 mmol/L (ref 3.5–5.1)
SODIUM: 140 mmol/L (ref 135–145)

## 2015-03-19 LAB — GLUCOSE, CAPILLARY
GLUCOSE-CAPILLARY: 197 mg/dL — AB (ref 70–99)
GLUCOSE-CAPILLARY: 141 mg/dL — AB (ref 70–99)
Glucose-Capillary: 157 mg/dL — ABNORMAL HIGH (ref 70–99)
Glucose-Capillary: 182 mg/dL — ABNORMAL HIGH (ref 70–99)

## 2015-03-19 LAB — MAGNESIUM: MAGNESIUM: 1.7 mg/dL (ref 1.5–2.5)

## 2015-03-19 NOTE — Progress Notes (Signed)
[facility] PROGRESS NOTE  [name] HAL:[id number] DOB: November 25, 1940 DOA: 03/16/2015 PCP: Vidal Schwalbe, MD  Assessment/Plan: 1. Chest pain 1. Cardiology was consulted and pt was found to have an abnormal stress test on 4/27 2. Pt underwent heart cath 4/28 with findings of 50% lesion of LAD, stents patent with recommendations for continued medical management 3. Currently remains pain free 2. DM 1. Cont on SSI coverage 3. Tobacco abuse 1. Stable 2. Cessation done at bedside 4. COPD 1. No wheezing on exam 2. On bronchodilators and PRN nebs 5. Chronic systolic CHF 1. Stable, appears euvolemic 2. 2d echo with EF of 45% 6. DVT prophylaxis 1. Lovenox subQ 7. Dementia 1. Stable 2. Family at bedside and is concerned that patient is unable to care for herself at home by herself and that a return to home would be unsafe (Pt noted to light cigarettes in the house by igniting paper with stove) 3. Family is strongly considering placement to SNF 4. Have consulted PT/OT for d/c planning with recs for SNF 5. Will d/w social work  Code Status: Full Family Communication: Pt in room Disposition Plan: Pending   Consultants:  Cardiology  Procedures:    Antibiotics:  none  HPI/Subjective: Pt feels well. Eager to go home. No cp  Objective: Filed Vitals:   03/19/15 1005 03/19/15 1142 03/19/15 1354 03/19/15 1449  BP: 139/54  122/56   Pulse: 69  66   Temp:   98.2 F (36.8 C)   TempSrc:   Oral   Resp:   16   Height:      Weight:      SpO2:  96% 94% 95%    Intake/Output Summary (Last 24 hours) at 03/19/15 1745 Last data filed at 03/19/15 1300  Gross per 24 hour  Intake    560 ml  Output    175 ml  Net    385 ml   Filed Weights   03/17/15 0601 03/18/15 0536 03/19/15 0535  Weight: 71.668 kg (158 lb) 72.576 kg (160 lb) 70.806 kg (156 lb 1.6 oz)    Exam:   General:  Awake, laying in bed, in nad  Cardiovascular: regular, s1, s2  Respiratory: normal resp  effort, no wheezing, no crackles  Abdomen: soft,nondistended, pos BS  Musculoskeletal: perfused, no clubbing   Data Reviewed: Basic Metabolic Panel:  Recent Labs Lab 03/16/15 1957 03/17/15 0311 03/18/15 0324 03/19/15 0652  NA 136  --  139 140  K 3.8  --  3.4* 3.7  CL 98  --  103 105  CO2 26  --  28 25  GLUCOSE 247*  --  159* 157*  BUN 20  --  15 12  CREATININE 0.87 0.86 0.80 0.85  CALCIUM 9.1  --  8.5 8.8  MG  --   --   --  1.7   Liver Function Tests: No results for input(s): AST, ALT, ALKPHOS, BILITOT, PROT, ALBUMIN in the last 168 hours. No results for input(s): LIPASE, AMYLASE in the last 168 hours. No results for input(s): AMMONIA in the last 168 hours. CBC:  Recent Labs Lab 03/16/15 1957 03/17/15 0311 03/18/15 0324  WBC 10.7* 9.7 8.5  NEUTROABS 7.5  --   --   HGB 14.6 15.3* 13.4  HCT 43.0 45.3 41.0  MCV 94.5 95.0 95.1  PLT 178 164 169   Cardiac Enzymes:  Recent Labs Lab 03/17/15 0311 03/17/15 0530 03/17/15 0900  TROPONINI <0.03 <0.03 <0.03   BNP (last 3 results)  Recent Labs  12/30/14 2354 03/16/15 1957  BNP 95.6 107.2*    ProBNP (last 3 results) No results for input(s): PROBNP in the last 8760 hours.  CBG:  Recent Labs Lab 03/18/15 1618 03/18/15 2054 03/19/15 0748 03/19/15 1158 03/19/15 1652  GLUCAP 136* 243* 157* 197* 141*    No results found for this or any previous visit (from the past 240 hour(s)).   Studies: No results found.  Scheduled Meds: . aspirin EC  81 mg Oral Daily  . clopidogrel  75 mg Oral Daily  . enoxaparin (LOVENOX) injection  40 mg Subcutaneous Q24H  . insulin aspart  0-9 Units Subcutaneous TID WC  . ipratropium  0.5 mg Nebulization QID  . isosorbide mononitrate  30 mg Oral Daily  . memantine  28 mg Oral Daily  . metoprolol succinate  25 mg Oral BID  . mometasone-formoterol  2 puff Inhalation BID  . PARoxetine  10 mg Oral Daily  . pravastatin  40 mg Oral q1800  . pregabalin  50 mg Oral BID    Continuous Infusions:    Principal Problem:   Chest pain Active Problems:   Fibromyalgia   DM2 (diabetes mellitus, type 2)   Essential hypertension, benign   Coronary atherosclerosis of native coronary artery   Chronic systolic heart failure   COPD (chronic obstructive pulmonary disease)   Tobacco use disorder   Cardiomyopathy, ischemic   Allergy to ACE inhibitors   Intolerance of drug   Ventricular tachycardia   CHIU, STEPHEN K  [facility] Pager [phone]. If 7PM-7AM, please contact night-coverage at [email or web address], password [facility] 03/19/2015, 5:45 PM  LOS: 1 day

## 2015-03-19 NOTE — Clinical Social Work Note (Signed)
Clinical Social Work Assessment  Patient Details  Name: [name] MRN: [id number] Date of Birth: [birth year]  Date of referral:  03/19/15               Reason for consult:  Facility Placement, Family Concerns                Permission sought to share information with:    Permission granted to share information::     Name::        Agency::     Relationship::     Contact Information:     Housing/Transportation Living arrangements for the past 2 months:  Single Family Home Source of Information:  Adult Children Patient Interpreter Needed:  None Criminal Activity/Legal Involvement Pertinent to Current Situation/Hospitalization:  No - Comment as needed Significant Relationships:  Adult Children, Other Family Members Lives with:  Self (Family provides intermittent supervision) Need for family participation in patient care:  Yes (Comment)  Care giving concerns: Pt family concerned about pt safety at home when they cannot provide supervision    Social Worker assessment / plan:  CSW spoke with pt daughter multiple times today. Pt daughter is concerned about pt safety at home due to cognition. She stated that pt is able to cover cognitive deficits so they do not seem as bad as they are. CSW discussed options with pt daughter who is HCPOA. She informed CSW that she knows both her mom and her sister would not be happy with placement at SNF. However, she feels this may be safest option. CSW informed pt daughter that CSW would need to discuss case with supervisor.  After speaking with supervisor CSW called pt daughter with following information. Pt daughter first concern was that pt is not agreeable to SNF. Per CSW supervisor, because of pt changing orientation and documented history of dementia pt daughter could make decision for SNF if needed. However, CSW informed pt daughter that after reviewing chart with CSW supervisor, it is unlikely that pt will be approved for SNF authorization by  insurance. Currently pt cognition seems to be larger issue which does not necessarily make pt appropriate for SNF. Pt daughter understanding of this and asked about second concern. Previously, CSW had spoken with her in regards to ALF/Memory Care. She expressed concern for payment. CSW notified that she should try to apply for Medicaid as misconceptions about Medicaid do often scare people away when they may in fact qualify. Pt daughter wanted to confirm that home health social worker would be visiting. CSW did confirm that this had been requested for MD to order. Pt daughter seemed to be more comfortable with plan for dc home after discussing all of the above with CSW. CSW will sign off as plan is for home with daughter providing transportation.    Insurance information:  Managed Medicare PT Recommendations:  [place]  Patient/Family's Response to care:  Pt daughter upset at first because she is concerned for pt safety. However, she was understanding of limitation due to insurance.  Patient/Family's Understanding of and Emotional Response to Diagnosis, Current Treatment, and Prognosis:  Pt daughter very calm, pleasant, and cooperative through out conversation. She has a good understanding of pt medical condition and wants to both follow pt wishes but also do what is safest.  Emotional Assessment Attitude/Demeanor/Rapport:  Unable to Assess Affect (typically observed):  Unable to Assess Orientation:  Fluctuating Orientation (Suspected and/or reported [employer]) Alcohol / Substance use:  Not Applicable Psych involvement (Current and /  or in the community):  No (Comment)  Discharge Needs  Concerns to be addressed:  Home Safety Concerns, Decision making concerns, Cognitive Concerns Readmission within the last 30 days:  No Current discharge risk:  Cognitively Impaired Barriers to Discharge:  Continued Medical Work up   [employer], [place]

## 2015-03-19 NOTE — Progress Notes (Signed)
CSW Armed forces technical officer) had long discussion with pt daughter over the phone. At this time, pt daughter would like to have pt dc home with family continuing to provide care. Pt daughter is open to home health services. RNCM notified and will also request [place] social worker to assist family with home placement at ALF/memory care should they decide on this plan. Pt daughter stated she will not be able to pick up pt until later in the day after work. CSW confirmed this was okay and asked she call nursing unit to notify of time. Pt nurse updated. CSW signing off.  [place], [place]

## 2015-03-19 NOTE — Care Management Note (Unsigned)
    Page 1 of 2   03/19/2015     2:12:47 PM CARE MANAGEMENT NOTE 03/19/2015  Patient:  [name], [name]   Account Number:  [id number]  Date Initiated:  03/19/2015  Documentation initiated by:  GRAVES-BIGELOW,Jarrell Armond  Subjective/Objective Assessment:   Pt admitted for Abnormal stress test on 4/27. Pt underwent heart cath 4/28 with findings of 50% lesion of LAD, stents patent.     Action/Plan:   CM did speak with pt in ref to disposition needs. Pt will benefit SNF, however may not be agreeable. CM did discuss [place] options. Per pt Niece may be able to stay some. CM did contact CSW to speak to Niece. Memory Care Unit is best option.   Anticipated DC Date:  03/20/2015   Anticipated DC Plan:  [place]  In-house referral  Clinical Social Worker      DC Planning Services  CM consult      [facility] Choice  HOME HEALTH   Choice offered to / List presented to:  C-1 Patient        [place] arranged  HH-1 RN  [place]      [facility] agency  [place]   Status of service:  In process, will continue to follow Medicare Important Message given?  YES (If response is "NO", the following Medicare IM given date fields will be blank) Date Medicare IM given:  03/19/2015 Medicare IM given by:  GRAVES-BIGELOW,Alec Mcphee Date Additional Medicare IM given:   Additional Medicare IM given by:    Discharge Disposition:  [place]  Per UR Regulation:  Reviewed for med. necessity/level of care/duration of stay  If discussed at [place] of Stay Meetings, dates discussed:    Comments:  03-19-15 [place], RN,BSN [phone] CSW did speak with CM in ref to [facility] services. Referral made to [facility]. SOC to begin within 24-48  hours of d/c. Pt will need [place] orders before d/c.   03-19-15 1152 Jacqlyn Krauss, RN,BSN [phone] CM will set up [facility] services if pt refuses SNF. Will  monitor.

## 2015-03-19 NOTE — Evaluation (Signed)
Physical Therapy Evaluation Patient Details Name: [name] MRN: [id number] DOB: Aug 07, 1941 Today's Date: 03/19/2015   History of Present Illness  [name] is a 74 yo female admitted with DM, HTN, CAD who c/o chest pain.  Pt underwent cardiac cath with a moderate LAD lesion noted.  Pt with chest pain now resolved.  Pt with h/o pneuomonia and has significant cough.  Pt states she smokes and will not quit.  Pt also with dementia.  Clinical Impression  Patient presents with decreased independence and safety with mobility due to deficits listed in PT problem list.  She will benefit from skilled PT in the acute setting and follow up SNF rehab at d/c.  Patient is unsafe to return home left alone for any length of time due to cognitive deficits.    Follow Up Recommendations SNF;Supervision/Assistance - 24 hour    Equipment Recommendations  None recommended by PT    Recommendations for Other Services       Precautions / Restrictions Precautions Precautions: Fall Precaution Comments: Memory issues.  Concern since pt is smoker re: safety in her home.      Mobility  Bed Mobility Overal bed mobility: Needs Assistance Bed Mobility: Supine to Sit;Sit to Supine     Supine to sit: Min assist Sit to supine: Min assist   General bed mobility comments: assist to lift trunk upright and assist to bring legs back into bed  Transfers Overall transfer level: Needs assistance Equipment used: Rolling walker (2 wheeled) Transfers: Sit to/from Stand Sit to Stand: Min guard         General transfer comment: assist for balance/safety  Ambulation/Gait Ambulation/Gait assistance: Min guard;Supervision Ambulation Distance (Feet): 200 Feet Assistive device: Rolling walker (2 wheeled) Gait Pattern/deviations: Step-through pattern;Shuffle;Trunk flexed     General Gait Details: staying to right inside walker and some difficulty keeping straight path  Stairs            Wheelchair  Mobility    Modified Rankin (Stroke Patients Only)       Balance Overall balance assessment: Needs assistance   Sitting balance-Leahy Scale: Good     Standing balance support: No upper extremity supported Standing balance-Leahy Scale: Fair Standing balance comment: initially holding walker, but able to stand static without UE support                             Pertinent Vitals/Pain Pain Assessment: No/denies pain    Home Living Family/patient expects to be discharged to:: Private residence Living Arrangements: Alone Available Help at Discharge: Available PRN/intermittently;Friend(s) Type of Home: House Home Access: Stairs to enter   [employer] of Steps: 4-5 Home Layout: One level Home Equipment: Walker - 2 wheels;Shower seat;Bedside commode Additional Comments: pt with difficulty recalling what she has and does not have; confirmed with daughter equipment, but states pt will not use walker    Prior Function           Comments: daughter states pt doesn't walk much at all,  States they have to make her bathe and she will frequently lay in her own excrement     Hand Dominance        Extremity/Trunk Assessment               Lower Extremity Assessment: Generalized weakness      Cervical / Trunk Assessment: Kyphotic  Communication   Communication: No difficulties  Cognition Arousal/Alertness: Awake/alert Behavior During Therapy: [facility] for  tasks assessed/performed Overall Cognitive Status: History of cognitive impairments - at baseline       Memory: Decreased recall of precautions;Decreased short-term memory              General Comments General comments (skin integrity, edema, etc.): Patient's neice in the room and reports patient with dementia.  She called pt's daughter and spoke with her over the phone regarding multiple safety issues in the home due to dementia.  Reports in the past has not been able to be placed due to  patient having capacity, but feel she is not caring for herself sufficiently, she is lighting cigarettes from the stove using oxygen at home and routinely does not have assist for at least 11 hours a day.    Exercises        Assessment/Plan    PT Assessment Patient needs continued PT services  PT Diagnosis Abnormality of gait;Generalized weakness   PT Problem List Decreased strength;Decreased mobility;Decreased safety awareness;Decreased cognition;Decreased balance  PT Treatment Interventions DME instruction;Therapeutic exercise;Therapeutic activities;Patient/family education;Functional mobility training;Gait training;Balance training   PT Goals (Current goals can be found in the Care Plan section) Acute Rehab PT Goals Patient Stated Goal: for placement PT Goal Formulation: With family Time For Goal Achievement: 04/02/15 Potential to Achieve Goals: Good    Frequency Min 3X/week   Barriers to discharge Decreased caregiver support      Co-evaluation               End of Session Equipment Utilized During Treatment: Gait belt Activity Tolerance: Patient tolerated treatment well Patient left: in bed;with family/visitor present           Time: 1416-1450 PT Time Calculation (min) (ACUTE ONLY): 34 min   Charges:   PT Evaluation $Initial PT Evaluation Tier I: 1 Procedure PT Treatments $Gait Training: 8-22 mins   PT G Codes:        Jakhia Buxton,CYNDI 04/13/15, 3:05 PM  Magda Kiel, [place] 04/13/2015

## 2015-03-19 NOTE — Evaluation (Signed)
Occupational Therapy Evaluation Patient Details Name: [name] MRN: [id number] DOB: January 04, 1941 Today's Date: 03/19/2015    History of Present Illness [name] is a 74 yo female admitted with DM, HTN, CAD who c/o chest pain.  Pt underwent cardiac cath with a moderate LAD lesion noted.  Pt with chest pain now resolved.  Pt with h/o pneuomonia and has significant cough.  Pt states she smokes and will not quit.  Pt also with dementia.   Clinical Impression   Pt admitted for above diagnosis and has the deficits listed below. Pt would benefit from cont OT to increase independence with basic adls so she can return home safely with some supervision with adls.  Pt lives alone and has some memory/safety deficits that are a concern for living alone. Pt is a smoker with poor memory.  Feel she is at risk for leaving cigarette lit at home or using oven and leaving it on.  Pt unable to state year, what adaptive equipment she has at home, if she bathes in shower or tub etc etc.  Pt also currently drives which is concerning.  Feel if she goes home she needs 24hr S.  Long term, pt may need a memory unit as she does well with her basic adls.      Follow Up Recommendations  SNF;Supervision/Assistance - 24 hour;Other (comment) (If she has 24 hour S, rec HHOT)    Equipment Recommendations  Tub/shower bench (ONLY if pt has tub wo doors. She cannot recall.)    Recommendations for Other Services       Precautions / Restrictions Precautions Precautions: Fall Precaution Comments: Memory issues.  Concern since pt is smoker re: safety in her home. Restrictions Weight Bearing Restrictions: No      Mobility Bed Mobility Overal bed mobility: Modified Independent                Transfers Overall transfer level: Needs assistance Equipment used: 1 person hand held assist Transfers: Sit to/from Stand Sit to Stand: Min guard         General transfer comment: Pt never needed outside assist to  maintain balance when walking in hallway from a person, but did want to hold to rails in hallway.    Balance Overall balance assessment: Needs assistance Sitting-balance support: Feet supported Sitting balance-Leahy Scale: Good     Standing balance support: No upper extremity supported Standing balance-Leahy Scale: Fair Standing balance comment: Pt walked in hallway but was very slow and unsteady although did not fully lose balance. Pt grabbed for rails on wall but did not want AD.                            ADL Overall ADL's : Needs assistance/impaired Eating/Feeding: Independent;Sitting   Grooming: Wash/dry face;Wash/dry hands;Oral care;Supervision/safety;Standing   Upper Body Bathing: Set up;Sitting   Lower Body Bathing: Supervison/ safety;Sit to/from stand;Cueing for safety   Upper Body Dressing : Set up;Sitting   Lower Body Dressing: Supervision/safety;Sit to/from stand;Cueing for safety Lower Body Dressing Details (indicate cue type and reason): Supervision only when standing. Toilet Transfer: Min guard;Ambulation;Comfort height toilet Toilet Transfer Details (indicate cue type and reason): Supervision to walk to toilet.  Pt c/o back pain.  Moves slowly and did not want to use AD as she does not use one at [facility]. Toileting- Clothing Manipulation and Hygiene: Supervision/safety;Sit to/from stand       Functional mobility during ADLs: Min guard General ADL  Comments: Pt is able to complete adls safely while sitting.  Pt gets very fatigued when up on her feet.  Pt states she ha had back pain that has bothered her for years that affects her mobility.  Pt smokes and has poor memory. Pt unable to state the year.  Feel pt is at risk for harming self or leaving cigarrette lit and burning home down due to her poor memory. Do not feel pt should be at home alone.  Pt also is still driving.  Rec her driving abilities be evaluated.     Vision Vision Assessment?: No apparent  visual deficits   Perception     Praxis      Pertinent Vitals/Pain Pain Assessment: No/denies pain     Hand Dominance Right   Extremity/Trunk Assessment Upper Extremity Assessment Upper Extremity Assessment: Generalized weakness   Lower Extremity Assessment Lower Extremity Assessment: Defer to PT evaluation   Cervical / Trunk Assessment Cervical / Trunk Assessment: Kyphotic   Communication Communication Communication: No difficulties   Cognition Arousal/Alertness: Awake/alert Behavior During Therapy: WFL for tasks assessed/performed Overall Cognitive Status: History of cognitive impairments - at baseline       Memory: Decreased recall of precautions;Decreased short-term memory             General Comments       Exercises       Shoulder Instructions      Home Living Family/patient expects to be discharged to:: Private residence Living Arrangements: Alone Available Help at Discharge: Available PRN/intermittently;Friend(s) Type of Home: House Home Access: Stairs to enter [employer] of Steps: 1+1   Home Layout: Multi-level;Able to live on main level with bedroom/bathroom     Bathroom Shower/Tub: Tub/shower unit;Door Shower/tub characteristics: Door Biochemist, clinical: Standard     Home Equipment: Environmental consultant - 2 wheels;Shower seat;Bedside commode   Additional Comments: pt with difficulty recalling what she has and does not have      Prior Functioning/Environment Level of Independence: Independent        Comments: per pt, independent:  will need to verify    OT Diagnosis: Generalized weakness;Acute pain;Cognitive deficits   OT Problem List: Decreased strength;Decreased activity tolerance;Impaired balance (sitting and/or standing);Decreased safety awareness;Decreased knowledge of use of DME or AE;Pain   OT Treatment/Interventions: Self-care/ADL training;DME and/or AE instruction;Therapeutic activities;Balance training    OT Goals(Current  goals can be found in the care plan section) Acute Rehab OT Goals Patient Stated Goal: to go home. OT Goal Formulation: With patient Time For Goal Achievement: 04/02/15 Potential to Achieve Goals: Fair ADL Goals Pt Will Perform Grooming: with modified independence;standing Pt Will Perform Tub/Shower Transfer: Tub transfer;tub bench;ambulating;with supervision Additional ADL Goal #1: Pt will walk to bathroom and toilet with S Additional ADL Goal #2: Pt will gather clothing and dress with mod I Additional ADL Goal #3: Pt will state 3 things she needs to do to make her home environment safer.  OT Frequency: Min 2X/week   Barriers to D/C: Decreased caregiver support  pt lives alone. has neice that can stay with her but only for a few days.       Co-evaluation              End of Session Nurse Communication: Mobility status;Other (comment) (O2 sats at 92% after walking on RA)  Activity Tolerance: Patient limited by pain;Patient limited by fatigue Patient left: in chair;with call bell/phone within reach   Time: 1050-1125 OT Time Calculation (min): 35 min Charges:  OT General  Charges $OT Visit: 1 Procedure OT Evaluation $Initial OT Evaluation Tier I: 1 Procedure OT Treatments $Self Care/Home Management : 8-22 mins G-Codes:    Glenford Peers 2015/04/09, 11:43 AM  [phone]

## 2015-03-19 NOTE — Progress Notes (Signed)
Patient Name: [name] Date of Encounter: 03/19/2015  Principal Problem:   Chest pain Active Problems:   Fibromyalgia   DM2 (diabetes mellitus, type 2)   Essential hypertension, benign   Coronary atherosclerosis of native coronary artery   Chronic systolic heart failure   COPD (chronic obstructive pulmonary disease)   Tobacco use disorder   Cardiomyopathy, ischemic   Allergy to ACE inhibitors   Intolerance of drug   Ventricular tachycardia   Primary Cardiologist: Dr Angelena Form  Patient Profile: 74 yo female w/ hx DM, HTN, HL, tob use, CAD was admitted 04/27 w/ chest pain. MV abnl, for cath 04/28 with non-obstructive disease.  SUBJECTIVE: No chest pain overnight, dyspnea is at baseline.  OBJECTIVE Filed Vitals:   03/18/15 1944 03/18/15 2101 03/19/15 0535 03/19/15 0713  BP:   148/67   Pulse:   65   Temp: 97.7 F (36.5 C)  98.1 F (36.7 C)   TempSrc: Oral  Oral   Resp:   18   Height:      Weight:   156 lb 1.6 oz (70.806 kg)   SpO2:  92% 96% 95%    Intake/Output Summary (Last 24 hours) at 03/19/15 0753 Last data filed at 03/18/15 1700  Gross per 24 hour  Intake    120 ml  Output      0 ml  Net    120 ml   Filed Weights   03/17/15 0601 03/18/15 0536 03/19/15 0535  Weight: 158 lb (71.668 kg) 160 lb (72.576 kg) 156 lb 1.6 oz (70.806 kg)    PHYSICAL EXAM General: Well developed, well nourished, female in no acute distress. Head: Normocephalic, atraumatic.  Neck: Supple without bruits, JVD not elevated. Lungs:  Resp regular and unlabored, rales bases. Heart: RRR, S1, S2, no S3, S4, 2/6 murmur; no rub. Abdomen: Soft, non-tender, non-distended, BS + x 4.  Extremities: No clubbing, cyanosis, edema. R groin cath site without ecchymosis, hematoma or bruit Neuro: Alert and oriented X 3. Moves all extremities spontaneously. Psych: Normal affect.  LABS: CBC: Recent Labs  03/16/15 1957 03/17/15 0311 03/18/15 0324  WBC 10.7* 9.7 8.5  NEUTROABS 7.5  --    --   HGB 14.6 15.3* 13.4  HCT 43.0 45.3 41.0  MCV 94.5 95.0 95.1  PLT 178 164 169   INR: Recent Labs  03/18/15 0324  INR 6.81   Basic Metabolic Panel: Recent Labs  03/16/15 1957 03/17/15 0311 03/18/15 0324  NA 136  --  139  K 3.8  --  3.4*  CL 98  --  103  CO2 26  --  28  GLUCOSE 247*  --  159*  BUN 20  --  15  CREATININE 0.87 0.86 0.80  CALCIUM 9.1  --  8.5   Cardiac Enzymes: Recent Labs  03/17/15 0311 03/17/15 0530 03/17/15 0900  TROPONINI <0.03 <0.03 <0.03    Recent Labs  03/16/15 2002  TROPIPOC 0.00   BNP:  B NATRIURETIC PEPTIDE  Date/Time Value Ref Range Status  03/16/2015 07:57 PM 107.2* 0.0 - 100.0 pg/mL Final  12/30/2014 11:54 PM 95.6 0.0 - 100.0 pg/mL Final   TELE:   SR, no VT in > 24 hours     Radiology/Studies: Nm Myocar Multi W/spect W/wall Motion / Ef  03/17/2015   CLINICAL DATA:  Chest pain  EXAM: Lexiscan Myovue  TECHNIQUE: The patient received IV Lexiscan .4mg  over 15 seconds. 33.0 mCi of Technetium 72m Sestamibi injected at 30 seconds. Quantitative  SPECT images were obtained in the vertical, horizontal and short axis planes after a 45 minute delay. Rest images were obtained with similar planes and delay using 10.2 mCi of Technetium 2m Sestamibi.  FINDINGS: ECG:  NSR, no change with Lexiscan infusion.  Quantitiative Gated SPECT EF:  47%, diffuse hypokinesis.  Perfusion Images: There was a small, mild apical anterior perfusion defect with Lexiscan stress. No significant perfusion defect at rest.  IMPRESSION: EF 47% with small, mild reversible apical anterior perfusion defect that may be indicative of a small area of ischemia. I would rate this an intermediate risk study given combination of decreased EF and small reversible perfusion defect.  [name]   Electronically Signed   By: Loralie Champagne M.D.   On: 03/17/2015 15:38     Current Medications:  . aspirin EC  81 mg Oral Daily  . clopidogrel  75 mg Oral Daily  . enoxaparin (LOVENOX)  injection  40 mg Subcutaneous Q24H  . insulin aspart  0-9 Units Subcutaneous TID WC  . ipratropium  0.5 mg Nebulization QID  . isosorbide mononitrate  30 mg Oral Daily  . memantine  28 mg Oral Daily  . metoprolol succinate  25 mg Oral BID  . mometasone-formoterol  2 puff Inhalation BID  . PARoxetine  10 mg Oral Daily  . pravastatin  40 mg Oral q1800  . pregabalin  50 mg Oral BID      ASSESSMENT AND PLAN: Principal Problem:   Chest pain - RCA stent patent and LAD 50%, med rx. - on ASA, Plavix, Imdur, BB and statin - encouraged heart-healthy lifestyle modifications - may have small vessel disease to account for apical ischemia  Otherwise, per IM. OK for d/c from a cardiac standpoint.  Active Problems:   Fibromyalgia   DM2 (diabetes mellitus, type 2)   Essential hypertension, benign   Coronary atherosclerosis of native coronary artery   Chronic systolic heart failure   COPD (chronic obstructive pulmonary disease)   Tobacco use disorder   Cardiomyopathy, ischemic   Allergy to ACE inhibitors   Intolerance of drug   Ventricular tachycardia   Signed, Rosaria Ferries , PA-C 7:53 AM 03/19/2015  Patient seen, examined. Available data reviewed. Agree with findings, assessment, and plan as outlined by Rosaria Ferries, PA-C. The patient is independently interviewed and examined. Her right groin site is clear. Cardiac catheterization data reviewed. Agree with plans for medical therapy and she has no severe obstructive coronary disease. She will continue on her current medications.  Sherren Mocha, M.D. 03/19/2015 12:11 PM

## 2015-03-20 DIAGNOSIS — J438 Other emphysema: Secondary | ICD-10-CM

## 2015-03-20 DIAGNOSIS — R0789 Other chest pain: Secondary | ICD-10-CM

## 2015-03-20 LAB — GLUCOSE, CAPILLARY
GLUCOSE-CAPILLARY: 170 mg/dL — AB (ref 70–99)
Glucose-Capillary: 241 mg/dL — ABNORMAL HIGH (ref 70–99)

## 2015-03-20 NOTE — Discharge Summary (Signed)
Physician Discharge Summary  [name] IFO:[id number] DOB: 07/28/1941 DOA: 03/16/2015  PCP: Vidal Schwalbe, MD  Admit date: 03/16/2015 Discharge date: 03/20/2015  Time spent: 20 minutes  Recommendations for Outpatient Follow-up:  1. Follow up with PCP in 1-2 weeks  Discharge Diagnoses:  Principal Problem:   Chest pain Active Problems:   Fibromyalgia   DM2 (diabetes mellitus, type 2)   Essential hypertension, benign   Coronary atherosclerosis of native coronary artery   Chronic systolic heart failure   COPD (chronic obstructive pulmonary disease)   Tobacco use disorder   Cardiomyopathy, ischemic   Allergy to ACE inhibitors   Intolerance of drug   Ventricular tachycardia   Discharge Condition: Stable  Diet recommendation: heart healthy, diabetic  Filed Weights   03/18/15 0536 03/19/15 0535 03/20/15 0555  Weight: 72.576 kg (160 lb) 70.806 kg (156 lb 1.6 oz) 68.947 kg (152 lb)    History of present illness:  Please see admit h and p from 4/27 for details. Briefly, pt presented with chest pains with significant cardiac risk factors. The patient was admitted for further work up.  Hospital Course:  1. Chest pain 1. Cardiology was consulted and pt was found to have an abnormal stress test on 4/27 2. Pt underwent heart cath 4/28 with findings of 50% lesion of LAD, stents patent with recommendations for continued medical management 3. Pt has remained chest pain free and cleared for d/c by Cardiology 2. DM 1. Cont on SSI coverage 3. Tobacco abuse 1. Stable 2. Cessation was done at bedside 4. COPD 1. No wheezing noted on exam 2. On bronchodilators and PRN nebs 5. Chronic systolic CHF 1. Stable, appears euvolemic 2. 2d echo with EF of 45% 6. DVT prophylaxis 1. Lovenox subQ 7. Dementia 1. Stable 2. Family at bedside and is concerned that patient is unable to care for herself at home by herself and that a return to home would be unsafe (Pt noted to light cigarettes  in the house by igniting paper with stove) 3. Family is strongly considering placement to SNF 4. Discussed case with Education officer, museum. Plan for d/c home with home health and home sw for eventual transition to ALF 5. Updated family who is aware and agrees  Procedures:  Abnormal stress test 4/27  Heart cath 4/28  Consultations:  Cardiology  Discharge Exam: Filed Vitals:   03/20/15 0555 03/20/15 0750 03/20/15 1058 03/20/15 1123  BP: 160/65  125/49   Pulse: 65  63   Temp: 98.2 F (36.8 C)     TempSrc: Oral     Resp: 18     Height:      Weight: 68.947 kg (152 lb)     SpO2: 92% 93%  94%    General: awake, in nad Cardiovascular: regular, s1, s2 Respiratory: normal resp effort, no wheezing  Discharge Instructions     Medication List    STOP taking these medications        levofloxacin 500 MG tablet  Commonly known as:  LEVAQUIN      TAKE these medications        albuterol (2.5 MG/3ML) 0.083% nebulizer solution  Commonly known as:  PROVENTIL  Take 2.5 mg by nebulization every 6 (six) hours as needed for wheezing or shortness of breath.     albuterol 108 (90 BASE) MCG/ACT inhaler  Commonly known as:  PROVENTIL HFA;VENTOLIN HFA  Inhale 2 puffs into the lungs every 6 (six) hours as needed for wheezing.     aspirin  EC 81 MG tablet  Take 81 mg by mouth daily.     chlorpheniramine-HYDROcodone 10-8 MG/5ML Lqcr  Commonly known as:  TUSSIONEX  Take 5 mLs by mouth every 12 (twelve) hours as needed for cough.     clopidogrel 75 MG tablet  Commonly known as:  PLAVIX  TAKE 1 TABLET BY MOUTH EVERY DAY     furosemide 20 MG tablet  Commonly known as:  LASIX  Take 1 tablet (20 mg total) by mouth as needed.     insulin glargine 100 UNIT/ML injection  Commonly known as:  LANTUS  Inject 0.1 mLs (10 Units total) into the skin daily.     insulin lispro 100 UNIT/ML injection  Commonly known as:  HUMALOG  Inject 0-10 Units into the skin 3 (three) times daily before meals.  Sliding scale: below 150=0 units, 150-200=2 units, 201-250=4 units, 251-300=6 units, 301-350=8 units, above 350=10 units     ipratropium 0.02 % nebulizer solution  Commonly known as:  ATROVENT  Take 0.5 mg by nebulization 4 (four) times daily. Four times daily     isosorbide mononitrate 30 MG 24 hr tablet  Commonly known as:  IMDUR  Take 1 tablet (30 mg total) by mouth daily.     LORazepam 0.5 MG tablet  Commonly known as:  ATIVAN  Take 0.5 mg by mouth 2 (two) times daily as needed for anxiety.     metFORMIN 500 MG tablet  Commonly known as:  GLUCOPHAGE  Take 2,000 mg by mouth daily before supper. Taking 4 pills at dinner     metoprolol succinate 25 MG 24 hr tablet  Commonly known as:  TOPROL-XL  Take 1 tablet (25 mg total) by mouth daily.     mometasone-formoterol 100-5 MCG/ACT Aero  Commonly known as:  DULERA  Inhale 2 puffs into the lungs 2 (two) times daily.     NAMENDA XR 28 MG Cp24 24 hr capsule  Generic drug:  memantine  TAKE ONE CAPSULE BY MOUTH EVERY DAY     nitroGLYCERIN 0.4 MG SL tablet  Commonly known as:  NITROSTAT  Place 1 tablet (0.4 mg total) under the tongue every 5 (five) minutes as needed. For chest pain.     nystatin cream  Commonly known as:  MYCOSTATIN  Apply 1 application topically as needed for dry skin.     PARoxetine 10 MG tablet  Commonly known as:  PAXIL  Take 10 mg by mouth daily.     pravastatin 40 MG tablet  Commonly known as:  PRAVACHOL  TAKE 1 TABLET (40 MG TOTAL) BY MOUTH DAILY. <PLEASE MAKE APPOINTMENT FOR REFILLS>     pregabalin 50 MG capsule  Commonly known as:  LYRICA  Take 50 mg by mouth 2 (two) times daily.     senna-docusate 8.6-50 MG per tablet  Commonly known as:  Senokot-S  Take 2 tablets by mouth at bedtime. Hold for diarrhea       Allergies  Allergen Reactions  . Lisinopril Other (See Comments)    REACTION: respiratory arrest jan 2009  . Spironolactone Other (See Comments)    PATIENT HAD SEVERE SIDE EFFECTS  .  Donepezil Diarrhea  . Eggs Or Egg-Derived Products Diarrhea  . Erythromycin Other (See Comments)    Makes me feel weird   . Macrolides And Ketolides Other (See Comments)  . Penicillins Itching and Other (See Comments)    Too much as a kid  . Quinapril Hcl Itching and Other (See Comments)  Too much AS A KID  . Angiotensin Receptor Blockers Other (See Comments)    unknown  . Pentazocine Lactate Other (See Comments)    Reaction unknown  . Propoxyphene Hcl Other (See Comments)    Reaction unknown  . Sulfonamide Derivatives Other (See Comments)    Childhood allergy   Follow-up Information    Follow up with [facility].   Why:  Registered Nurse, Education officer, museum, Physical Therapy and Aide.    Contact information:   [place] [address] [phone]       Follow up with Vidal Schwalbe, MD. Schedule an appointment as soon as possible for a visit in 1 week.   Specialty:  Family Medicine   Why:  Hospital follow up   Contact information:   [place] [address] [phone]        The results of significant diagnostics from this hospitalization (including imaging, microbiology, ancillary and laboratory) are listed below for reference.    Significant Diagnostic Studies: Dg Chest 2 View  03/16/2015   CLINICAL DATA:  74 year old female with chest pain radiating into the left arm and shoulder  EXAM: CHEST  2 VIEW  COMPARISON:  Prior chest x-ray 12/31/2014  FINDINGS: Stable cardiac and mediastinal contours with borderline cardiomegaly. Atherosclerotic calcification present in the transverse aorta. Stable appearance of the left lung base secondary to left heart enlargement and a prominent pericardial fat pad. No evidence of pulmonary edema, new focal airspace consolidation, pleural effusion or pneumothorax. Metallic coronary stent overlies the right heart. No acute osseous abnormality. Mild multilevel degenerative disc disease.   IMPRESSION: Stable chest x-ray without evidence of acute cardiopulmonary process.   Electronically Signed   By: Jacqulynn Cadet M.D.   On: 03/16/2015 21:30   Nm Myocar Multi W/spect W/wall Motion / Ef  03/17/2015   CLINICAL DATA:  Chest pain  EXAM: Lexiscan Myovue  TECHNIQUE: The patient received IV Lexiscan .4mg  over 15 seconds. 33.0 mCi of Technetium 51m Sestamibi injected at 30 seconds. Quantitative SPECT images were obtained in the vertical, horizontal and short axis planes after a 45 minute delay. Rest images were obtained with similar planes and delay using 10.2 mCi of Technetium 99m Sestamibi.  FINDINGS: ECG:  NSR, no change with Lexiscan infusion.  Quantitiative Gated SPECT EF:  47%, diffuse hypokinesis.  Perfusion Images: There was a small, mild apical anterior perfusion defect with Lexiscan stress. No significant perfusion defect at rest.  IMPRESSION: EF 47% with small, mild reversible apical anterior perfusion defect that may be indicative of a small area of ischemia. I would rate this an intermediate risk study given combination of decreased EF and small reversible perfusion defect.  [name]   Electronically Signed   By: Loralie Champagne M.D.   On: 03/17/2015 15:38    Microbiology: No results found for this or any previous visit (from the past 240 hour(s)).   Labs: Basic Metabolic Panel:  Recent Labs Lab 03/16/15 1957 03/17/15 0311 03/18/15 0324 03/19/15 0652  NA 136  --  139 140  K 3.8  --  3.4* 3.7  CL 98  --  103 105  CO2 26  --  28 25  GLUCOSE 247*  --  159* 157*  BUN 20  --  15 12  CREATININE 0.87 0.86 0.80 0.85  CALCIUM 9.1  --  8.5 8.8  MG  --   --   --  1.7   Liver Function Tests:  No results for input(s): AST, ALT, ALKPHOS, BILITOT, PROT, ALBUMIN in the last 168 hours. No results for input(s): LIPASE, AMYLASE in the last 168 hours. No results for input(s): AMMONIA in the last 168 hours. CBC:  Recent Labs Lab 03/16/15 1957 03/17/15 0311 03/18/15 0324   WBC 10.7* 9.7 8.5  NEUTROABS 7.5  --   --   HGB 14.6 15.3* 13.4  HCT 43.0 45.3 41.0  MCV 94.5 95.0 95.1  PLT 178 164 169   Cardiac Enzymes:  Recent Labs Lab 03/17/15 0311 03/17/15 0530 03/17/15 0900  TROPONINI <0.03 <0.03 <0.03   BNP: BNP (last 3 results)  Recent Labs  12/30/14 2354 03/16/15 1957  BNP 95.6 107.2*    ProBNP (last 3 results) No results for input(s): PROBNP in the last 8760 hours.  CBG:  Recent Labs Lab 03/19/15 0748 03/19/15 1158 03/19/15 1652 03/19/15 2108 03/20/15 1138  GLUCAP 157* 197* 141* 182* 241*    Signed:  Darlinda Bellows K  [facility] 03/20/2015, 1:12 PM

## 2015-03-20 NOTE — Progress Notes (Signed)
CSW (Clinical Education officer, museum) received consult informing that family is still interested in SNF. CSW paged MD to notify that CSW informed family yesterday that insurance coverage is the issue for pt dc to SNF, and plan will need to be dc home with home health services while family works on ALF placement.   [place], LCSWA Weekend CSW [phone]

## 2015-03-31 ENCOUNTER — Emergency Department (HOSPITAL_COMMUNITY): Payer: Medicare Other

## 2015-03-31 ENCOUNTER — Encounter (HOSPITAL_COMMUNITY): Payer: Self-pay | Admitting: Emergency Medicine

## 2015-03-31 ENCOUNTER — Emergency Department (HOSPITAL_COMMUNITY)
Admission: EM | Admit: 2015-03-31 | Discharge: 2015-03-31 | Disposition: A | Discharge status: Home / Self Care | Payer: Medicare Other | Attending: Emergency Medicine | Admitting: Emergency Medicine

## 2015-03-31 DIAGNOSIS — Z8719 Personal history of other diseases of the digestive system: Secondary | ICD-10-CM | POA: Diagnosis not present

## 2015-03-31 DIAGNOSIS — E119 Type 2 diabetes mellitus without complications: Secondary | ICD-10-CM | POA: Diagnosis not present

## 2015-03-31 DIAGNOSIS — E785 Hyperlipidemia, unspecified: Secondary | ICD-10-CM | POA: Diagnosis not present

## 2015-03-31 DIAGNOSIS — H409 Unspecified glaucoma: Secondary | ICD-10-CM | POA: Diagnosis not present

## 2015-03-31 DIAGNOSIS — I2510 Atherosclerotic heart disease of native coronary artery without angina pectoris: Secondary | ICD-10-CM | POA: Insufficient documentation

## 2015-03-31 DIAGNOSIS — Z7982 Long term (current) use of aspirin: Secondary | ICD-10-CM | POA: Insufficient documentation

## 2015-03-31 DIAGNOSIS — M1990 Unspecified osteoarthritis, unspecified site: Secondary | ICD-10-CM | POA: Diagnosis not present

## 2015-03-31 DIAGNOSIS — J441 Chronic obstructive pulmonary disease with (acute) exacerbation: Secondary | ICD-10-CM | POA: Diagnosis not present

## 2015-03-31 DIAGNOSIS — Z79899 Other long term (current) drug therapy: Secondary | ICD-10-CM | POA: Diagnosis not present

## 2015-03-31 DIAGNOSIS — Z880 Allergy status to penicillin: Secondary | ICD-10-CM | POA: Insufficient documentation

## 2015-03-31 DIAGNOSIS — E663 Overweight: Secondary | ICD-10-CM | POA: Insufficient documentation

## 2015-03-31 DIAGNOSIS — I10 Essential (primary) hypertension: Secondary | ICD-10-CM | POA: Diagnosis not present

## 2015-03-31 DIAGNOSIS — F0390 Unspecified dementia without behavioral disturbance: Secondary | ICD-10-CM | POA: Diagnosis not present

## 2015-03-31 DIAGNOSIS — Z720 Tobacco use: Secondary | ICD-10-CM | POA: Insufficient documentation

## 2015-03-31 DIAGNOSIS — R079 Chest pain, unspecified: Secondary | ICD-10-CM

## 2015-03-31 DIAGNOSIS — R0602 Shortness of breath: Secondary | ICD-10-CM

## 2015-03-31 DIAGNOSIS — Z794 Long term (current) use of insulin: Secondary | ICD-10-CM | POA: Insufficient documentation

## 2015-03-31 DIAGNOSIS — Z87442 Personal history of urinary calculi: Secondary | ICD-10-CM | POA: Insufficient documentation

## 2015-03-31 LAB — CBC WITH DIFFERENTIAL/PLATELET
Basophils Absolute: 0.1 10*3/uL (ref 0.0–0.1)
Basophils Relative: 0 % (ref 0–1)
EOS ABS: 0.1 10*3/uL (ref 0.0–0.7)
Eosinophils Relative: 1 % (ref 0–5)
HCT: 45.1 % (ref 36.0–46.0)
Hemoglobin: 15 g/dL (ref 12.0–15.0)
LYMPHS ABS: 2.2 10*3/uL (ref 0.7–4.0)
Lymphocytes Relative: 16 % (ref 12–46)
MCH: 31.9 pg (ref 26.0–34.0)
MCHC: 33.3 g/dL (ref 30.0–36.0)
MCV: 96 fL (ref 78.0–100.0)
MONOS PCT: 6 % (ref 3–12)
Monocytes Absolute: 0.8 10*3/uL (ref 0.1–1.0)
Neutro Abs: 10.4 10*3/uL — ABNORMAL HIGH (ref 1.7–7.7)
Neutrophils Relative %: 77 % (ref 43–77)
PLATELETS: 146 10*3/uL — AB (ref 150–400)
RBC: 4.7 MIL/uL (ref 3.87–5.11)
RDW: 12.8 % (ref 11.5–15.5)
WBC: 13.6 10*3/uL — ABNORMAL HIGH (ref 4.0–10.5)

## 2015-03-31 LAB — I-STAT TROPONIN, ED: Troponin i, poc: 0 ng/mL (ref 0.00–0.08)

## 2015-03-31 LAB — BASIC METABOLIC PANEL
ANION GAP: 11 (ref 5–15)
BUN: 12 mg/dL (ref 6–20)
CO2: 31 mmol/L (ref 22–32)
Calcium: 9 mg/dL (ref 8.9–10.3)
Chloride: 98 mmol/L — ABNORMAL LOW (ref 101–111)
Creatinine, Ser: 0.81 mg/dL (ref 0.44–1.00)
GFR calc Af Amer: >60 mL/min (ref >60)
GLUCOSE: 217 mg/dL — AB (ref 70–99)
POTASSIUM: 3.4 mmol/L — AB (ref 3.5–5.1)
SODIUM: 140 mmol/L (ref 135–145)

## 2015-03-31 LAB — BRAIN NATRIURETIC PEPTIDE: B NATRIURETIC PEPTIDE 5: 118.2 pg/mL — AB (ref 0.0–100.0)

## 2015-03-31 MED ORDER — PREDNISONE 20 MG PO TABS: 40.0000 mg | ORAL_TABLET | Freq: Every day | ORAL | Status: DC

## 2015-03-31 MED ORDER — METHYLPREDNISOLONE SODIUM SUCC 125 MG IJ SOLR
125.0000 mg | Freq: Once | INTRAMUSCULAR | Status: AC
  Administered 2015-03-31: 125 mg via INTRAVENOUS
  Filled 2015-03-31: qty 2

## 2015-03-31 NOTE — ED Notes (Signed)
Attempted to call 2 family members listed unable to speak with either.

## 2015-03-31 NOTE — ED Provider Notes (Signed)
CSN: [id number]     Arrival date & time 03/31/15  0840 History   First MD Initiated Contact with Patient 03/31/15 [phone]     Chief Complaint  Patient presents with  . Shortness of Breath  . Chest Pain     (Consider location/radiation/quality/duration/timing/severity/associated sxs/prior Treatment) HPI Comments: Patient with past medical history significant for dementia, hypertension, hyperlipidemia, diabetes, COPD, cardiomyopathy, coronary artery disease, prior inferior MI with stenting 2 to RCA, cath 02/2015 with non-critical CAD -- presenting to the ED for chest pain, shortness of breath. Level V caveat 2/2 dementia. Pt states she had mid-chest pain this morning. She is unsure if she was short of breath or if she is on O2 at home (at night). EMS gave breathing treatment. She was found to have O2 sat in upper 80's on EMS arrival.   Patient is a 74 y.o. female presenting with shortness of breath and chest pain. The history is provided by the patient.  Shortness of Breath Associated symptoms: chest pain   Chest Pain Associated symptoms: shortness of breath     Past Medical History  Diagnosis Date  . CAD (coronary artery disease)     a. s/p inferior STEMI 12/29/10 with rotablator atherectomy RCA 12/31/10 and DES x 2 RCA;  b. Lexiscan Myoview (11/2013):  Low risk study, no ischemia, inferolateral defect likely consistent with attenuation versus scar.  . Ischemic cardiomyopathy     EF 45%cath 2012 EF55% 5/12; 35% 11/14  . Diabetes mellitus     type 2  . HTN (hypertension)   . Hyperlipidemia   . Diverticulosis   . Respiratory arrest//ACE Inhibitor presumed cause   . COPD (chronic obstructive pulmonary disease)   . Small bowel obstruction     from a sigmoid stricture  . Nephrolithiasis   . Sleep apnea   . Overweight(278.02)   . Osteoarthritis   . GERD (gastroesophageal reflux disease)   . Anxiety   . Glaucoma   . Memory deficit   . Dementia    Past Surgical History  Procedure  Laterality Date  . Bilateral tubal ligation    . Lithotripsy    . 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 Cardiac surgery    . Cardiac catheterization N/A 03/18/2015    Procedure: LEFT HEART CATH AND CORONARY ANGIOGRAPHY;  Surgeon: Lorretta Harp, MD;  Location: [facility] CATH LAB;  Service: Cardiovascular;  Laterality: N/A;   Family History  Problem Relation Age of Onset  . Breast cancer Maternal Grandmother   . Diabetes Father   . Heart disease Father   . Diabetes Sister   . Colon cancer Neg Hx    History  Substance Use Topics  . Smoking status: Current Every Day Smoker -- 1.00 packs/day for 25 years    Types: Cigarettes  . Smokeless tobacco: Never Used     Comment: over a ppd for 40+ years; quit 10/2004.  unsuccessfully tried eCigs.  . Alcohol Use: No   OB History    No data available     Review of Systems  Unable to perform ROS Respiratory: Positive for shortness of breath.   Cardiovascular: Positive for chest pain.      Allergies  Lisinopril; Spironolactone; Donepezil; Eggs or egg-derived products; Erythromycin; Macrolides and ketolides; Penicillins; Quinapril hcl; Angiotensin receptor blockers; Pentazocine lactate; Propoxyphene hcl; and Sulfonamide derivatives  Home Medications   Prior to Admission medications   Medication Sig Start Date End Date Taking? Authorizing Provider  albuterol (PROVENTIL  HFA;VENTOLIN HFA) 108 (90 BASE) MCG/ACT inhaler Inhale 2 puffs into the lungs every 6 (six) hours as needed for wheezing.    Historical Provider, MD  albuterol (PROVENTIL) (2.5 MG/3ML) 0.083% nebulizer solution Take 2.5 mg by nebulization every 6 (six) hours as needed for wheezing or shortness of breath.     Historical Provider, MD  aspirin EC 81 MG tablet Take 81 mg by mouth daily.    Historical Provider, MD  chlorpheniramine-HYDROcodone (TUSSIONEX) 10-8 MG/5ML LQCR Take 5 mLs by mouth every 12 (twelve) hours as needed for cough. 01/01/15   Simbiso  Ranga, MD  clopidogrel (PLAVIX) 75 MG tablet TAKE 1 TABLET BY MOUTH EVERY DAY 11/11/14   Burnell Blanks, MD  furosemide (LASIX) 20 MG tablet Take 1 tablet (20 mg total) by mouth as needed. 10/28/13   Liliane Shi, PA-C  insulin glargine (LANTUS) 100 UNIT/ML injection Inject 0.1 mLs (10 Units total) into the skin daily. 01/25/14   Hosie Poisson, MD  insulin lispro (HUMALOG) 100 UNIT/ML injection Inject 0-10 Units into the skin 3 (three) times daily before meals. Sliding scale: below 150=0 units, 150-200=2 units, 201-250=4 units, 251-300=6 units, 301-350=8 units, above 350=10 units    Historical Provider, MD  ipratropium (ATROVENT) 0.02 % nebulizer solution Take 0.5 mg by nebulization 4 (four) times daily. Four times daily 10/30/13   [name] S Parrett, NP  isosorbide mononitrate (IMDUR) 30 MG 24 hr tablet Take 1 tablet (30 mg total) by mouth daily. 10/14/13   Donita Brooks, NP  LORazepam (ATIVAN) 0.5 MG tablet Take 0.5 mg by mouth 2 (two) times daily as needed for anxiety.    Historical Provider, MD  metFORMIN (GLUCOPHAGE) 500 MG tablet Take 2,000 mg by mouth daily before supper. Taking 4 pills at dinner    Historical Provider, MD  metoprolol succinate (TOPROL-XL) 25 MG 24 hr tablet Take 1 tablet (25 mg total) by mouth daily. 10/14/13   Donita Brooks, NP  mometasone-formoterol (DULERA) 100-5 MCG/ACT AERO Inhale 2 puffs into the lungs 2 (two) times daily.    Historical Provider, MD  NAMENDA XR 28 MG CP24 24 hr capsule TAKE ONE CAPSULE BY MOUTH EVERY DAY 02/08/15   Ward Givens, NP  nitroGLYCERIN (NITROSTAT) 0.4 MG SL tablet Place 1 tablet (0.4 mg total) under the tongue every 5 (five) minutes as needed. For chest pain. 06/18/13   Burnell Blanks, MD  nystatin cream (MYCOSTATIN) Apply 1 application topically as needed for dry skin.    Historical Provider, MD  PARoxetine (PAXIL) 10 MG tablet Take 10 mg by mouth daily.    Historical Provider, MD  pravastatin (PRAVACHOL) 40 MG tablet TAKE 1  TABLET (40 MG TOTAL) BY MOUTH DAILY. <PLEASE MAKE APPOINTMENT FOR REFILLS> 12/25/14   Burnell Blanks, MD  pregabalin (LYRICA) 50 MG capsule Take 50 mg by mouth 2 (two) times daily.    Historical Provider, MD  senna-docusate (SENOKOT-S) 8.6-50 MG per tablet Take 2 tablets by mouth at bedtime. Hold for diarrhea Patient taking differently: Take 2 tablets by mouth at bedtime as needed for moderate constipation. Hold for diarrhea 10/14/13   Donita Brooks, NP   BP 126/55 mmHg  Pulse 79  Temp(Src) 98.1 F (36.7 C) (Oral)  Resp 22  Ht 5\' 5"  (1.651 m)  Wt 154 lb (69.854 kg)  BMI 25.63 kg/m2  SpO2 95%   Physical Exam  Constitutional: She appears well-developed and well-nourished.  HENT:  Head: Normocephalic and atraumatic.  Mouth/Throat: Oropharynx is clear  and moist and mucous membranes are normal. Mucous membranes are not dry.  Eyes: Conjunctivae are normal. Right eye exhibits no discharge. Left eye exhibits no discharge.  Neck: Trachea normal and normal range of motion. Neck supple. Normal carotid pulses and no JVD present. No muscular tenderness present. Carotid bruit is not present. No tracheal deviation present.  Cardiovascular: Normal rate, regular rhythm, S1 normal, S2 normal, normal heart sounds and intact distal pulses.  Exam reveals no decreased pulses.   No murmur heard. Pulmonary/Chest: Effort normal and breath sounds normal. No respiratory distress. She has no wheezes. She exhibits no tenderness.  Abdominal: Soft. Normal aorta and bowel sounds are normal. There is no tenderness. There is no rebound and no guarding.  Musculoskeletal: Normal range of motion.  Neurological: She is alert.  Patient is appropriate but cannot recall details.   Skin: Skin is warm and dry. She is not diaphoretic. No cyanosis. No pallor.  Psychiatric: She has a normal mood and affect.  Nursing note and vitals reviewed.   ED Course  Procedures (including critical care time) Labs Review Labs  Reviewed  CBC WITH DIFFERENTIAL/PLATELET - Abnormal; Notable for the following:    WBC 13.6 (*)    Platelets 146 (*)    Neutro Abs 10.4 (*)    All other components within normal limits  BASIC METABOLIC PANEL - Abnormal; Notable for the following:    Potassium 3.4 (*)    Chloride 98 (*)    Glucose, Bld 217 (*)    All other components within normal limits  BRAIN NATRIURETIC PEPTIDE - Abnormal; Notable for the following:    B Natriuretic Peptide 118.2 (*)    All other components within normal limits  I-STAT TROPOININ, ED    Imaging Review Dg Chest 2 View  03/31/2015   CLINICAL DATA:  Intermittent chest pain and shortness of breath for several days  EXAM: CHEST  2 VIEW  COMPARISON:  March 16, 2015  FINDINGS: There is no edema or consolidation. There is a prominent epicardial fat pad on the left. Heart is mildly enlarged, stable. Pulmonary vascularity is normal. There is a stent in the right coronary artery. There is atherosclerotic change in aorta. No adenopathy. No pneumothorax. No bone lesions.  IMPRESSION: Stable mild cardiac prominence. No edema or consolidation. Stent in right coronary artery. Atherosclerotic change in aorta.   Electronically Signed   By: Lowella Grip III M.D.   On: 03/31/2015 09:41     EKG Interpretation   Date/Time:  Wednesday Mar 31 2015 08:49:13 EDT Ventricular Rate:  78 PR Interval:  177 QRS Duration: 86 QT Interval:  411 QTC Calculation: 468 R Axis:   78 Text Interpretation:  Sinus rhythm Consider left atrial enlargement  Anterior infarct, old No significant change since last tracing Confirmed  by WARD,  DO, KRISTEN ([id number]) on 03/31/2015 8:52:52 AM       11:25 AM Patient discussed earlier with Dr. Leonides Schanz who has seen patient. Patient is now completely asymptomatic.  Vital signs reviewed and are as follows: BP 104/80 mmHg  Pulse 79  Temp(Src) 98.1 F (36.7 C) (Oral)  Resp 24  Ht 5\' 5"  (1.651 m)  Wt 154 lb (69.854 kg)  BMI 25.63 kg/m2  SpO2 93%    Patient ambulated with minor drop in pulse ox -- the patient states that she was not short of breath with this. She states that she feels back to her normal self. No current chest pain. No current shortness of breath.  Upon entering the room, patient resting without oxygen, O2 sat of 93%. I asked the patient if she is comfortable with discharge to home with PCP follow-up. She says that she feels comfortable going home.  Discussed this plan with Dr. Leonides Schanz who is in agreement. Will discharge to home with PCP follow-up. Patient encouraged to return in emergency department with recurrent chest pain, worsening shortness of breath, fever, cough, or other concerns. She verbalizes understanding and agrees with plan. Prior to discharge will give dose of solumedrol and prednisone burst for home.    MDM   Final diagnoses:  Shortness of breath  Chest pain, unspecified chest pain type   Patient with history of CHF but does not appear to be fluid overloaded. Chest x-ray does not show pulmonary edema and BNP is at her baseline. Possible COPD exacerbation. No wheezing on exam. Patient is not visibly short of breath. Patient has a history of borderline hypoxia with oxygen saturations in the upper 80s to low 90s. This is demonstrated in her recent hospital visit. This is where patient is today. She has ambulated without significant shortness of breath. Offered admission versus discharge to home with PCP follow-up. Patient wants to go home. Patient recently had a complete cardiac workup including cardiac cath which showed obstructive coronary artery disease only. Troponin is negative today. EKG is unchanged. Patient does not have persistent pain, tachycardia, severe shortness of breath and feel that PE is very unlikely at this time. Feel patient is stable and safe for discharge to home.    Carlisle Cater, PA-C 03/31/15 1133

## 2015-03-31 NOTE — ED Notes (Signed)
Pt arrives from home via [facility], reports woke up and "just didn't feel right."     Pt reports SOB, chest pain.  EMS reports pt RA O2 sats 86%.  EMS reports giving 5 Albuterol, pt O2 sats 96-98%. Pt reports CP made worse with palpation, improved with breathing treatment.  Pt reports cannot remember if uses O2 at home.

## 2015-03-31 NOTE — ED Notes (Signed)
Called pts daughter Benjamine Mola. pts daughter is sending someone to pick up her mom. RN notified daughter that pt would be waiting in waiting room.

## 2015-03-31 NOTE — ED Notes (Signed)
Stared at RA 94%, while ambulating dropped down to RA 87%.

## 2015-03-31 NOTE — Discharge Instructions (Signed)
Please read and follow all provided instructions.  Your diagnoses today include:  1. Shortness of breath     Tests performed today include:  An EKG of your heart - no new changes  A chest x-ray - no fluid build-up or pneumonia  Cardiac enzymes - a blood test for heart muscle damage, no sign of heart attack  Blood counts and electrolytes  Blood test for heart failure - not higher than usual  Vital signs. See below for your results today.   Medications prescribed:   Prednisone - steroid medicine   It is best to take this medication in the morning to prevent sleeping problems. If you are diabetic, monitor your blood sugar closely and stop taking Prednisone if blood sugar is over 300. Take with food to prevent stomach upset.   Take any prescribed medications only as directed.  Follow-up instructions: Please follow-up with your primary care provider as soon as you can for further evaluation of your symptoms.   Return instructions:  SEEK IMMEDIATE MEDICAL ATTENTION IF:  You have severe chest pain, especially if the pain is crushing or pressure-like and spreads to the arms, back, neck, or jaw, or if you have sweating, nausea (feeling sick to your stomach), or shortness of breath. THIS IS AN EMERGENCY. Don't wait to see if the pain will go away. Get medical help at once. Call 911 or [phone] (operator). DO NOT drive yourself to the hospital.   Your chest pain gets worse and does not go away with rest.   You have an attack of chest pain lasting longer than usual, despite rest and treatment with the medications your caregiver has prescribed.   You wake from sleep with chest pain or shortness of breath.  You feel dizzy or faint.  You have chest pain not typical of your usual pain for which you originally saw your caregiver.   You have any other emergent concerns regarding your health.  Additional Information: Chest pain comes from many different causes. Your caregiver has diagnosed you  as having chest pain that is not specific for one problem, but does not require admission.  You are at low risk for an acute heart condition or other serious illness.   Your vital signs today were: BP 104/80 mmHg   Pulse 79   Temp(Src) 98.1 F (36.7 C) (Oral)   Resp 24   Ht 5\' 5"  (1.651 m)   Wt 154 lb (69.854 kg)   BMI 25.63 kg/m2   SpO2 93% If your blood pressure (BP) was elevated above 135/85 this visit, please have this repeated by your doctor within one month. --------------

## 2015-03-31 NOTE — ED Provider Notes (Signed)
Medical screening examination/treatment/procedure(s) were conducted as a shared visit with non-physician practitioner(s) and myself.  I personally evaluated the patient during the encounter.   EKG Interpretation   Date/Time:  Wednesday Mar 31 2015 08:49:13 EDT Ventricular Rate:  78 PR Interval:  177 QRS Duration: 86 QT Interval:  411 QTC Calculation: 468 R Axis:   78 Text Interpretation:  Sinus rhythm Consider left atrial enlargement  Anterior infarct, old No significant change since last tracing Confirmed  by Dareld Mcauliffe,  DO, [place] ([id number]) on 03/31/2015 8:52:52 AM      Pt is a 74 y.o. female with history of CHF, nonobstructive coronary artery disease, COPD who presents to the emergency department with episode of chest pain that occurred earlier this morning. She is a very poor historian and unable to provide many details of this chest pain but reports she is feeling better. No shortness of breath currently. Unclear if she had shortness of breath with this episode or any other symptoms. Does not appear volume overloaded on exam. Recently had a coronary catheterization in April 2016 which showed nonobstructive coronary artery disease. On exam patient appears comfortable, no lower extremity swelling or pain, lungs are clear to auscultation bilaterally, normal heart sounds. Workup in the ED has been unremarkable. She has very mild hypoxia with ambulation but this is likely chronic due to her COPD. Her lungs are still clear and she has no respiratory distress or complaints of shortness of breath. I feel she is safe to be discharged home and does not meet criteria for medical admission at this time.  [place], DO 03/31/15 1447

## 2015-04-29 ENCOUNTER — Encounter (HOSPITAL_COMMUNITY): Payer: Self-pay | Admitting: Emergency Medicine

## 2015-04-29 ENCOUNTER — Emergency Department (HOSPITAL_COMMUNITY): Payer: Medicare Other

## 2015-04-29 ENCOUNTER — Emergency Department (HOSPITAL_COMMUNITY)
Admission: EM | Admit: 2015-04-29 | Discharge: 2015-04-29 | Disposition: A | Discharge status: Home / Self Care | Payer: Medicare Other | Attending: Emergency Medicine | Admitting: Emergency Medicine

## 2015-04-29 DIAGNOSIS — E119 Type 2 diabetes mellitus without complications: Secondary | ICD-10-CM | POA: Diagnosis not present

## 2015-04-29 DIAGNOSIS — Z794 Long term (current) use of insulin: Secondary | ICD-10-CM | POA: Insufficient documentation

## 2015-04-29 DIAGNOSIS — E785 Hyperlipidemia, unspecified: Secondary | ICD-10-CM | POA: Insufficient documentation

## 2015-04-29 DIAGNOSIS — Z8719 Personal history of other diseases of the digestive system: Secondary | ICD-10-CM | POA: Diagnosis not present

## 2015-04-29 DIAGNOSIS — Z9889 Other specified postprocedural states: Secondary | ICD-10-CM | POA: Insufficient documentation

## 2015-04-29 DIAGNOSIS — E663 Overweight: Secondary | ICD-10-CM | POA: Diagnosis not present

## 2015-04-29 DIAGNOSIS — Z7902 Long term (current) use of antithrombotics/antiplatelets: Secondary | ICD-10-CM | POA: Diagnosis not present

## 2015-04-29 DIAGNOSIS — Z720 Tobacco use: Secondary | ICD-10-CM | POA: Insufficient documentation

## 2015-04-29 DIAGNOSIS — J441 Chronic obstructive pulmonary disease with (acute) exacerbation: Secondary | ICD-10-CM | POA: Insufficient documentation

## 2015-04-29 DIAGNOSIS — F419 Anxiety disorder, unspecified: Secondary | ICD-10-CM | POA: Diagnosis not present

## 2015-04-29 DIAGNOSIS — Z87442 Personal history of urinary calculi: Secondary | ICD-10-CM | POA: Insufficient documentation

## 2015-04-29 DIAGNOSIS — F0390 Unspecified dementia without behavioral disturbance: Secondary | ICD-10-CM | POA: Insufficient documentation

## 2015-04-29 DIAGNOSIS — Z7952 Long term (current) use of systemic steroids: Secondary | ICD-10-CM | POA: Insufficient documentation

## 2015-04-29 DIAGNOSIS — R002 Palpitations: Secondary | ICD-10-CM | POA: Diagnosis not present

## 2015-04-29 DIAGNOSIS — Z79899 Other long term (current) drug therapy: Secondary | ICD-10-CM | POA: Insufficient documentation

## 2015-04-29 DIAGNOSIS — M1990 Unspecified osteoarthritis, unspecified site: Secondary | ICD-10-CM | POA: Insufficient documentation

## 2015-04-29 DIAGNOSIS — I2510 Atherosclerotic heart disease of native coronary artery without angina pectoris: Secondary | ICD-10-CM | POA: Diagnosis not present

## 2015-04-29 DIAGNOSIS — H409 Unspecified glaucoma: Secondary | ICD-10-CM | POA: Diagnosis not present

## 2015-04-29 DIAGNOSIS — I10 Essential (primary) hypertension: Secondary | ICD-10-CM | POA: Insufficient documentation

## 2015-04-29 DIAGNOSIS — Z7982 Long term (current) use of aspirin: Secondary | ICD-10-CM | POA: Diagnosis not present

## 2015-04-29 LAB — CBC
HCT: 44.4 % (ref 36.0–46.0)
Hemoglobin: 15.6 g/dL — ABNORMAL HIGH (ref 12.0–15.0)
MCH: 33.3 pg (ref 26.0–34.0)
MCHC: 35.1 g/dL (ref 30.0–36.0)
MCV: 94.9 fL (ref 78.0–100.0)
PLATELETS: 138 10*3/uL — AB (ref 150–400)
RBC: 4.68 MIL/uL (ref 3.87–5.11)
RDW: 13.1 % (ref 11.5–15.5)
WBC: 8.6 10*3/uL (ref 4.0–10.5)

## 2015-04-29 LAB — COMPREHENSIVE METABOLIC PANEL
ALBUMIN: 3.6 g/dL (ref 3.5–5.0)
ALT: 9 U/L — AB (ref 14–54)
AST: 19 U/L (ref 15–41)
Alkaline Phosphatase: 78 U/L (ref 38–126)
Anion gap: 12 (ref 5–15)
BUN: 15 mg/dL (ref 6–20)
CHLORIDE: 102 mmol/L (ref 101–111)
CO2: 25 mmol/L (ref 22–32)
Calcium: 8.9 mg/dL (ref 8.9–10.3)
Creatinine, Ser: 0.72 mg/dL (ref 0.44–1.00)
GFR calc non Af Amer: >60 mL/min (ref >60)
Glucose, Bld: 166 mg/dL — ABNORMAL HIGH (ref 65–99)
Potassium: 4.1 mmol/L (ref 3.5–5.1)
Sodium: 139 mmol/L (ref 135–145)
Total Bilirubin: 0.4 mg/dL (ref 0.3–1.2)
Total Protein: 6.9 g/dL (ref 6.5–8.1)

## 2015-04-29 LAB — MAGNESIUM: Magnesium: 1.6 mg/dL — ABNORMAL LOW (ref 1.7–2.4)

## 2015-04-29 MED ORDER — MAGNESIUM 30 MG PO TABS: 30.0000 mg | ORAL_TABLET | Freq: Two times a day (BID) | ORAL | Status: AC

## 2015-04-29 MED ORDER — MAGNESIUM SULFATE IN D5W 10-5 MG/ML-% IV SOLN
1.0000 g | Freq: Once | INTRAVENOUS | Status: AC
  Administered 2015-04-29: 1 g via INTRAVENOUS
  Filled 2015-04-29: qty 100

## 2015-04-29 NOTE — ED Provider Notes (Signed)
CSN: [id number]     Arrival date & time 04/29/15  1629 History   First MD Initiated Contact with Patient 04/29/15 1642     Chief Complaint  Patient presents with  . Palpitations     (Consider location/radiation/quality/duration/timing/severity/associated sxs/prior Treatment) Patient is a 74 y.o. female presenting with palpitations and shortness of breath.  Palpitations Associated symptoms: shortness of breath   Associated symptoms: no back pain, no chest pain, no cough, no diaphoresis, no dizziness, no hemoptysis, no nausea, no syncope, no vomiting and no weakness   Shortness of Breath Severity:  Moderate Onset quality:  Sudden Duration:  1 minute Timing:  Rare Progression:  Resolved Chronicity:  Recurrent Context comment:  Palpatations Relieved by: self resolved. Worsened by:  Nothing tried Associated symptoms: no abdominal pain, no chest pain, no claudication, no cough, no diaphoresis, no ear pain, no fever, no headaches, no hemoptysis, no rash, no sore throat, no syncope, no vomiting and no wheezing   Risk factors: tobacco use   Risk factors: no recent alcohol use, no hx of PE/DVT, no prolonged immobilization and no recent surgery     Past Medical History  Diagnosis Date  . CAD (coronary artery disease)     a. s/p inferior STEMI 12/29/10 with rotablator atherectomy RCA 12/31/10 and DES x 2 RCA;  b. Lexiscan Myoview (11/2013):  Low risk study, no ischemia, inferolateral defect likely consistent with attenuation versus scar.  . Ischemic cardiomyopathy     EF 45%cath 2012 EF55% 5/12; 35% 11/14  . Diabetes mellitus     type 2  . HTN (hypertension)   . Hyperlipidemia   . Diverticulosis   . Respiratory arrest//ACE Inhibitor presumed cause   . COPD (chronic obstructive pulmonary disease)   . Small bowel obstruction     from a sigmoid stricture  . Nephrolithiasis   . Sleep apnea   . Overweight(278.02)   . Osteoarthritis   . GERD (gastroesophageal reflux disease)   . Anxiety    . Glaucoma   . Memory deficit   . Dementia    Past Surgical History  Procedure Laterality Date  . Bilateral tubal ligation    . Lithotripsy    . 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 Cardiac surgery    . Cardiac catheterization N/A 03/18/2015    Procedure: LEFT HEART CATH AND CORONARY ANGIOGRAPHY;  Surgeon: Lorretta Harp, MD;  Location[facility] CATH LAB;  Service: Cardiovascular;  Laterality: N/A;   Family History  Problem Relation Age of Onset  . Breast cancer Maternal Grandmother   . Diabetes Father   . Heart disease Father   . Diabetes Sister   . Colon cancer Neg Hx    History  Substance Use Topics  . Smoking status: Current Every Day Smoker -- 1.00 packs/day for 25 years    Types: Cigarettes  . Smokeless tobacco: Never Used     Comment: over a ppd for 40+ years; quit 10/2004.  unsuccessfully tried eCigs.  . Alcohol Use: No   OB History    No data available     Review of Systems  Constitutional: Negative for fever, chills, diaphoresis, appetite change and fatigue.  HENT: Negative for congestion, ear pain, facial swelling, mouth sores and sore throat.   Eyes: Negative for visual disturbance.  Respiratory: Positive for shortness of breath. Negative for cough, hemoptysis, chest tightness and wheezing.   Cardiovascular: Positive for palpitations. Negative for chest pain, claudication and syncope.  Gastrointestinal: Negative for  nausea, vomiting, abdominal pain, diarrhea and blood in stool.  Endocrine: Negative for cold intolerance and heat intolerance.  Genitourinary: Negative for frequency, decreased urine volume and difficulty urinating.  Musculoskeletal: Negative for back pain and neck stiffness.  Skin: Negative for rash.  Neurological: Negative for dizziness, weakness, light-headedness and headaches.  All other systems reviewed and are negative.     Allergies  Lisinopril; Spironolactone; Donepezil; Eggs or egg-derived products;  Erythromycin; Macrolides and ketolides; Penicillins; Quinapril hcl; Angiotensin receptor blockers; Pentazocine lactate; Propoxyphene hcl; and Sulfonamide derivatives  Home Medications   Prior to Admission medications   Medication Sig Start Date End Date Taking? Authorizing Provider  albuterol (PROVENTIL HFA;VENTOLIN HFA) 108 (90 BASE) MCG/ACT inhaler Inhale 1-2 puffs into the lungs every 6 (six) hours as needed for wheezing.    Yes Historical Provider, MD  albuterol (PROVENTIL) (2.5 MG/3ML) 0.083% nebulizer solution Take 2.5 mg by nebulization every 6 (six) hours as needed for wheezing or shortness of breath.    Yes Historical Provider, MD  aspirin EC 81 MG tablet Take 81 mg by mouth daily.   Yes Historical Provider, MD  clopidogrel (PLAVIX) 75 MG tablet TAKE 1 TABLET BY MOUTH EVERY DAY 11/11/14  Yes Burnell Blanks, MD  furosemide (LASIX) 20 MG tablet Take 1 tablet (20 mg total) by mouth as needed. 10/28/13  Yes Scott T Kathlen Mody, PA-C  HYDROcodone-homatropine (HYCODAN) 5-1.5 MG/5ML syrup Take 5 mLs by mouth every 6 (six) hours as needed for cough.   Yes Historical Provider, MD  insulin glargine (LANTUS) 100 UNIT/ML injection Inject 0.1 mLs (10 Units total) into the skin daily. 01/25/14  Yes Hosie Poisson, MD  insulin lispro (HUMALOG) 100 UNIT/ML injection Inject 0-10 Units into the skin 3 (three) times daily before meals. Sliding scale: below 150=0 units, 150-200=2 units, 201-250=4 units, 251-300=6 units, 301-350=8 units, above 350=10 units   Yes Historical Provider, MD  LORazepam (ATIVAN) 0.5 MG tablet Take 0.5 mg by mouth 2 (two) times daily as needed for anxiety.   Yes Historical Provider, MD  metFORMIN (GLUCOPHAGE) 500 MG tablet Take 2,000 mg by mouth daily before supper. Taking 4 pills at dinner   Yes Historical Provider, MD  metoprolol succinate (TOPROL-XL) 25 MG 24 hr tablet Take 1 tablet (25 mg total) by mouth daily. 10/14/13  Yes Donita Brooks, NP  mometasone-formoterol (DULERA) 100-5  MCG/ACT AERO Inhale 2 puffs into the lungs 2 (two) times daily.   Yes Historical Provider, MD  NAMENDA XR 28 MG CP24 24 hr capsule TAKE ONE CAPSULE BY MOUTH EVERY DAY 02/08/15  Yes Ward Givens, NP  nitroGLYCERIN (NITROSTAT) 0.4 MG SL tablet Place 1 tablet (0.4 mg total) under the tongue every 5 (five) minutes as needed. For chest pain. 06/18/13  Yes Burnell Blanks, MD  PARoxetine (PAXIL) 10 MG tablet Take 10 mg by mouth daily.   Yes Historical Provider, MD  pravastatin (PRAVACHOL) 40 MG tablet TAKE 1 TABLET (40 MG TOTAL) BY MOUTH DAILY. <PLEASE MAKE APPOINTMENT FOR REFILLS> 12/25/14  Yes Burnell Blanks, MD  pregabalin (LYRICA) 50 MG capsule Take 50 mg by mouth 2 (two) times daily.   Yes Historical Provider, MD  senna-docusate (SENOKOT-S) 8.6-50 MG per tablet Take 2 tablets by mouth at bedtime. Hold for diarrhea Patient taking differently: Take 2 tablets by mouth at bedtime as needed for moderate constipation. Hold for diarrhea 10/14/13  Yes Donita Brooks, NP  chlorpheniramine-HYDROcodone (TUSSIONEX) 10-8 MG/5ML LQCR Take 5 mLs by mouth every 12 (twelve) hours as  needed for cough. 01/01/15   Simbiso Ranga, MD  magnesium 30 MG tablet Take 1 tablet (30 mg total) by mouth 2 (two) times daily. 04/29/15 05/28/15  Addison Lank, MD  predniSONE (DELTASONE) 20 MG tablet Take 2 tablets (40 mg total) by mouth daily. 03/31/15   Carlisle Cater, PA-C   BP 136/56 mmHg  Pulse 82  Resp 23  SpO2 94% Physical Exam  Constitutional: She is oriented to person, place, and time. She appears well-developed and well-nourished. No distress.  HENT:  Head: Normocephalic and atraumatic.  Right Ear: External ear normal.  Left Ear: External ear normal.  Nose: Nose normal.  Eyes: Conjunctivae and EOM are normal. Pupils are equal, round, and reactive to light. Right eye exhibits no discharge. Left eye exhibits no discharge. No scleral icterus.  Neck: Normal range of motion. Neck supple.  Cardiovascular: Normal rate,  regular rhythm and normal heart sounds.  Exam reveals no gallop and no friction rub.   No murmur heard. Pulmonary/Chest: Effort normal and breath sounds normal. No stridor. No respiratory distress. She has no wheezes.  Abdominal: Soft. She exhibits no distension. There is no tenderness.  Musculoskeletal: She exhibits no edema or tenderness.  Neurological: She is alert and oriented to person, place, and time.  Skin: Skin is warm and dry. No rash noted. She is not diaphoretic. No erythema.  Psychiatric: She has a normal mood and affect.    ED Course  Procedures (including critical care time) Labs Review Labs Reviewed  CBC - Abnormal; Notable for the following:    Hemoglobin 15.6 (*)    Platelets 138 (*)    All other components within normal limits  COMPREHENSIVE METABOLIC PANEL - Abnormal; Notable for the following:    Glucose, Bld 166 (*)    ALT 9 (*)    All other components within normal limits  MAGNESIUM - Abnormal; Notable for the following:    Magnesium 1.6 (*)    All other components within normal limits    Imaging Review Dg Chest 2 View  04/29/2015   CLINICAL DATA:  Cardiac palpitations. Initial encounter. Asthma and diabetes.  EXAM: CHEST  2 VIEW  COMPARISON:  03/31/2015.  12/31/2014.  FINDINGS: Chronic unchanged cardiomegaly. Large LEFT-sided pericardial fat pad with atelectasis/scarring in the lingula. No airspace disease or pleural effusion. Aortic arch atherosclerosis. Coronary artery stent again noted.  IMPRESSION: Chronic cardiomegaly and atelectasis/ scarring in the lingula without acute cardiopulmonary disease.   Electronically Signed   By: Dereck Ligas M.D.   On: 04/29/2015 17:52     EKG Interpretation   Date/Time:  Thursday April 29 2015 16:40:39 EDT Ventricular Rate:  81 PR Interval:  184 QRS Duration: 86 QT Interval:  420 QTC Calculation: 487 R Axis:   82 Text Interpretation:  Sinus rhythm Consider left atrial enlargement  Borderline right axis deviation  Borderline prolonged QT interval No  significant change since last tracing Confirmed by BEATON  MD, ROBERT  ([id number]) on 04/29/2015 5:04:58 PM      MDM   74 year old female with a history of hypertension, diabetes, CAD status post 4 vessel CABG, CHF, COPD who presents today with 1 minute of palpitations that are similar to her prior palpitations however this lasted longer than normal. The patient denied any associated chest pain, shortness of breath, diaphoresis, nausea, lightheadedness. Symptoms resolved spontaneously without intervention. Rest of the history and exam as above. EKG with normal sinus rhythm, mild right axis deviation and mild prolonged QT. No evidence of acute  ischemia arrhythmia or blocks. CBC nonspecific. CMP with no electrolyte derangements. Magnesium of 1.6. Chest x-ray unremarkable.  Patient was given magnesium in the ED and monitored for several hours without recurrence of symptoms. Patient remained hemodynamically stable and was deemed safe for discharge. Patient was discharged in good condition with strict return precautions. She is provided with a prescription for magnesium and instructed to follow-up with her PCP in one week for close follow-up.  Patient seen in conjunction with Dr. Audie Pinto.  Sibyl Parr, MD. Resident  Final diagnoses:  Palpitation  Hypomagnesemia        Addison Lank, MD 04/30/15 0000  Leonard Schwartz, MD 05/02/15 2101

## 2015-04-29 NOTE — ED Notes (Signed)
Unable to draw labs from IV. Phlebotomy at bedside to do so.

## 2015-04-29 NOTE — ED Notes (Signed)
Pt back from x-ray.

## 2015-04-29 NOTE — ED Notes (Signed)
Pt leaving for x-ray.  ?

## 2015-04-29 NOTE — ED Notes (Signed)
Pt reports the palpitations she experienced today lasted longer than usual, reports it lasting "about 1-2 minutes." Pt also states she experienced shortness of breath with these palpitations. Pt denies chest pain. Pt states she called her doctor and he told her to come to ED for evaluation of this. Pt also states intermittent feelings of being "swimmy headed" x 1 month.

## 2015-04-29 NOTE — ED Notes (Signed)
Per EMS: pt coming from home with c/o palpitations. Pt denies chest pain, shortness of breath. Pt states she has these palpitations several times a week. Upon EMS arrival pt was in NSR. Pt denies any symptoms at this time, 12 lead unremarkable.

## 2015-04-29 NOTE — Discharge Instructions (Signed)
Palpitations A palpitation is the feeling that your heartbeat is irregular or is faster than normal. It may feel like your heart is fluttering or skipping a beat. Palpitations are usually not a serious problem. However, in some cases, you may need further medical evaluation. CAUSES  Palpitations can be caused by:  Smoking.  Caffeine or other stimulants, such as diet pills or energy drinks.  Alcohol.  Stress and anxiety.  Strenuous physical activity.  Fatigue.  Certain medicines.  Heart disease, especially if you have a history of irregular heart rhythms (arrhythmias), such as atrial fibrillation, atrial flutter, or supraventricular tachycardia.  An improperly working pacemaker or defibrillator. DIAGNOSIS  To find the cause of your palpitations, your health care provider will take your medical history and perform a physical exam. Your health care provider may also have you take a test called an ambulatory electrocardiogram (ECG). An ECG records your heartbeat patterns over a 24-hour period. You may also have other tests, such as:  Transthoracic echocardiogram (TTE). During echocardiography, sound waves are used to evaluate how blood flows through your heart.  Transesophageal echocardiogram (TEE).  Cardiac monitoring. This allows your health care provider to monitor your heart rate and rhythm in real time.  Holter monitor. This is a portable device that records your heartbeat and can help diagnose heart arrhythmias. It allows your health care provider to track your heart activity for several days, if needed.  Stress tests by exercise or by giving medicine that makes the heart beat faster. TREATMENT  Treatment of palpitations depends on the cause of your symptoms and can vary greatly. Most cases of palpitations do not require any treatment other than time, relaxation, and monitoring your symptoms. Other causes, such as atrial fibrillation, atrial flutter, or supraventricular  tachycardia, usually require further treatment. HOME CARE INSTRUCTIONS   Avoid:  Caffeinated coffee, tea, soft drinks, diet pills, and energy drinks.  Chocolate.  Alcohol.  Stop smoking if you smoke.  Reduce your stress and anxiety. Things that can help you relax include:  A method of controlling things in your body, such as your heartbeats, with your mind (biofeedback).  Yoga.  Meditation.  Physical activity such as swimming, jogging, or walking.  Get plenty of rest and sleep. SEEK MEDICAL CARE IF:   You continue to have a fast or irregular heartbeat beyond 24 hours.  Your palpitations occur more often. SEEK IMMEDIATE MEDICAL CARE IF:  You have chest pain or shortness of breath.  You have a severe headache.  You feel dizzy or you faint. MAKE SURE YOU:  Understand these instructions.  Will watch your condition.  Will get help right away if you are not doing well or get worse. Document Released: 11/03/2000 Document Revised: 11/11/2013 Document Reviewed: 01/05/2012 [facility] Patient Information 2015 [place], [place]. This information is not intended to replace advice given to you by your health care provider. Make sure you discuss any questions you have with your health care provider.  Hypomagnesemia Magnesium is a common ion (mineral) in the body which is needed for metabolism. It is about how the body handles food and other chemical reactions necessary for life. Only about 2% of the magnesium in our body is found in the blood. When this is low, it is called hypomagnesemia. The blood will measure only a tiny amount of the magnesium in our body. When it is low in our blood, it does not mean that the whole body supply is low. The normal serum concentration ranges from 1.8-2.5 mEq/L. When  the level gets to be less than 1.0 mEq/L, a number of problems begin to happen.  CAUSES   Receiving intravenous fluids without magnesium replacement.  Loss of magnesium from the bowel  by nasogastric suction.  Loss of magnesium from nausea and vomiting or severe diarrhea. Any of the inflammatory bowel conditions can cause this.  Abuse of alcohol often leads to low serum magnesium.  An inherited form of magnesium loss happens when the kidneys lose magnesium. This is called familial or primary hypomagnesemia.  Some medications such as diuretics also cause the loss of magnesium. SYMPTOMS  These following problems are worse if the changes in magnesium levels come on suddenly.  Tremor.  Confusion.  Muscle weakness.  Oversensitive to sights and sounds.  Sensitive reflexes.  Depression.  Muscular fibrillations.  Overreactivity of the nerves.  Irritability.  Psychosis.  Spasms of the hand muscles.  Tetany (where the muscles go into uncontrollable spasms). DIAGNOSIS  This condition can be diagnosed by blood tests. TREATMENT   In an emergency, magnesium can be given intravenously (by vein).  If the condition is less worrisome, it can be corrected by diet. High levels of magnesium are found in green leafy vegetables, peas, beans, and nuts among other things. It can also be given through medications by mouth.  If it is being caused by medications, changes can be made.  If alcohol is a problem, help is available if there are difficulties giving it up. Document Released: 08/02/2005 Document Revised: 03/23/2014 Document Reviewed: 06/26/2008 [facility] Patient Information 2015 [place], [place]. This information is not intended to replace advice given to you by your health care provider. Make sure you discuss any questions you have with your health care provider.

## 2015-05-06 ENCOUNTER — Ambulatory Visit
Admission: RE | Admit: 2015-05-06 | Discharge: 2015-05-06 | Disposition: A | Discharge status: Home / Self Care | Payer: Medicare Other | Source: Ambulatory Visit | Attending: Family Medicine | Admitting: Family Medicine

## 2015-05-06 ENCOUNTER — Other Ambulatory Visit: Payer: Self-pay | Admitting: Family Medicine

## 2015-05-06 DIAGNOSIS — R1031 Right lower quadrant pain: Secondary | ICD-10-CM

## 2015-05-06 MED ORDER — IOPAMIDOL (ISOVUE-300) INJECTION 61%
100.0000 mL | Freq: Once | INTRAVENOUS | Status: AC | PRN
  Administered 2015-05-06: 100 mL via INTRAVENOUS

## 2015-05-17 ENCOUNTER — Other Ambulatory Visit: Payer: Self-pay

## 2015-06-04 ENCOUNTER — Telehealth: Payer: Self-pay | Admitting: Internal Medicine

## 2015-06-04 MED ORDER — IPRATROPIUM BROMIDE 0.02 % IN SOLN: 0.5000 mg | Freq: Four times a day (QID) | RESPIRATORY_TRACT | Status: DC

## 2015-06-04 NOTE — Telephone Encounter (Signed)
Return call from [place].Hillery Hunter

## 2015-06-04 NOTE — Telephone Encounter (Signed)
Spoke with pt daughter. Pt needed refill on her atrovent. This was sent to [facility] to get pt to her APPT 9/1. Nothing further needed

## 2015-06-04 NOTE — Telephone Encounter (Signed)
lmomtcb x1 for [employer]

## 2015-07-06 ENCOUNTER — Ambulatory Visit (INDEPENDENT_AMBULATORY_CARE_PROVIDER_SITE_OTHER): Payer: Medicare Other | Admitting: Adult Health

## 2015-07-06 ENCOUNTER — Encounter: Payer: Self-pay | Admitting: Adult Health

## 2015-07-06 VITALS — BP 119/73 | HR 92 | Ht 65.0 in | Wt 158.0 lb

## 2015-07-06 DIAGNOSIS — R413 Other amnesia: Secondary | ICD-10-CM | POA: Diagnosis not present

## 2015-07-06 NOTE — Patient Instructions (Signed)
Continue namenda. We will continue to monitor.  If your symptoms worsen or you develop new symptoms please let us know.  [employer]-- Laurey Morale: [phone]

## 2015-07-06 NOTE — Progress Notes (Signed)
PATIENT: [name] DOB: 05/06/1941  REASON FOR VISIT: follow up- progressive memory disorder HISTORY FROM: patient  HISTORY OF PRESENT ILLNESS: [name] is a 74 year old female with a history of progressive memory disorder. She returns today for follow-up. She continues to take Namenda and tolerates it well. Patient continues to live alone however she has a caregiver there day and night. Her caregivers consist of family members. Her daughter reports that she requires assistance with ADLs. Due to ongoing back pain she requires assistance with getting in and out of the bathtub. Daughter reports that they provide her with meals and do the house cleaning. She reports that her mother may become agitated when they ask her to take a shower. She also reports that sometimes her mother will have conversations with her but she actually thinks she is talking to someone else. She reports that these things have been progressive denies any sudden change. She returns today for an evaluation.  HISTORY 01/05/15: [name] is a 74 year old female with a history of a progressive memory disorder. She returns today for follow-up. She is currently taking Namenda and tolerating it well. She was recently in the hospital for healthcare acquired pneumonia. She reports that her memory has gotten a little worse. Her granddaughter states that she has periods where she doesn't know where she is or who people are. She has moments where are memory is clear and others times she is confused. She continues to live at home but she has family that stays with her 24/7. She is able to complete all ADLs independently. Granddaughter does state that she is weaker- however she was just in the hospital. She no longer drives. She does very little cooking at home. Family provides food or they eat out. [name]. Family states that she gets agitated if family try to get her to stop. If she can't find her lighter she will use  the stove to light it. For this reason family decided to stay with her 24/7. She continues to take ABX from the hospital discharge  REVIEW OF SYSTEMS: Out of a complete 14 system review of symptoms, the patient complains only of the following symptoms, and all other reviewed systems are negative.  Activity change, fatigue, ear pain, shortness of breath, palpitations, murmur, abdominal pain, diarrhea, insomnia, snoring, incontinence of bladder, urgency, joint pain, back pain, aching muscles, walking difficulty, neck pain, moles, agitation, confusion, nervous/anxious, memory loss, headache, weakness, tremors, bruise/bleed easily  ALLERGIES: Allergies  Allergen Reactions  . Lisinopril Other (See Comments)    REACTION: respiratory arrest jan 2009  . Spironolactone Other (See Comments)    PATIENT HAD SEVERE SIDE EFFECTS  . Donepezil Diarrhea  . Eggs Or Egg-Derived Products Diarrhea  . Erythromycin Other (See Comments)    Makes me feel weird   . Macrolides And Ketolides Other (See Comments)  . Penicillins Itching and Other (See Comments)    Too much as a kid  . Quinapril Hcl Itching and Other (See Comments)    Too much AS A KID  . Angiotensin Receptor Blockers Other (See Comments)    unknown  . Pentazocine Lactate Other (See Comments)    Reaction unknown  . Propoxyphene Hcl Other (See Comments)    Reaction unknown  . Sulfonamide Derivatives Other (See Comments)    Childhood allergy    HOME MEDICATIONS: Outpatient Prescriptions Prior to Visit  Medication Sig Dispense Refill  . albuterol (PROVENTIL HFA;VENTOLIN HFA) 108 (90 BASE) MCG/ACT  inhaler Inhale 1-2 puffs into the lungs every 6 (six) hours as needed for wheezing.     Marland Kitchen albuterol (PROVENTIL) (2.5 MG/3ML) 0.083% nebulizer solution Take 2.5 mg by nebulization every 6 (six) hours as needed for wheezing or shortness of breath.     Marland Kitchen aspirin EC 81 MG tablet Take 81 mg by mouth daily.    . chlorpheniramine-HYDROcodone (TUSSIONEX) 10-8  MG/5ML LQCR Take 5 mLs by mouth every 12 (twelve) hours as needed for cough. 1 Bottle 0  . clopidogrel (PLAVIX) 75 MG tablet TAKE 1 TABLET BY MOUTH EVERY DAY 30 tablet 0  . furosemide (LASIX) 20 MG tablet Take 1 tablet (20 mg total) by mouth as needed. 30 tablet 1  . HYDROcodone-homatropine (HYCODAN) 5-1.5 MG/5ML syrup Take 5 mLs by mouth every 6 (six) hours as needed for cough.    . insulin glargine (LANTUS) 100 UNIT/ML injection Inject 0.1 mLs (10 Units total) into the skin daily. 10 mL 1  . insulin lispro (HUMALOG) 100 UNIT/ML injection Inject 0-10 Units into the skin 3 (three) times daily before meals. Sliding scale: below 150=0 units, 150-200=2 units, 201-250=4 units, 251-300=6 units, 301-350=8 units, above 350=10 units    . ipratropium (ATROVENT) 0.02 % nebulizer solution Take 2.5 mLs (0.5 mg total) by nebulization 4 (four) times daily. DX CODE J44.9 360 mL 0  . LORazepam (ATIVAN) 0.5 MG tablet Take 0.5 mg by mouth 2 (two) times daily as needed for anxiety.    . metFORMIN (GLUCOPHAGE) 500 MG tablet Take 2,000 mg by mouth daily before supper. Taking 4 pills at dinner    . metoprolol succinate (TOPROL-XL) 25 MG 24 hr tablet Take 1 tablet (25 mg total) by mouth daily. 30 tablet 3  . mometasone-formoterol (DULERA) 100-5 MCG/ACT AERO Inhale 2 puffs into the lungs 2 (two) times daily.    Marland Kitchen NAMENDA XR 28 MG CP24 24 hr capsule TAKE ONE CAPSULE BY MOUTH EVERY DAY 30 capsule 5  . nitroGLYCERIN (NITROSTAT) 0.4 MG SL tablet Place 1 tablet (0.4 mg total) under the tongue every 5 (five) minutes as needed. For chest pain. 25 tablet 6  . PARoxetine (PAXIL) 10 MG tablet Take 10 mg by mouth daily.    . pravastatin (PRAVACHOL) 40 MG tablet TAKE 1 TABLET (40 MG TOTAL) BY MOUTH DAILY. <PLEASE MAKE APPOINTMENT FOR REFILLS> 15 tablet 0  . pregabalin (LYRICA) 50 MG capsule Take 50 mg by mouth 2 (two) times daily.    . predniSONE (DELTASONE) 20 MG tablet Take 2 tablets (40 mg total) by mouth daily. (Patient not taking:  Reported on 07/06/2015) 8 tablet 0  . senna-docusate (SENOKOT-S) 8.6-50 MG per tablet Take 2 tablets by mouth at bedtime. Hold for diarrhea (Patient not taking: Reported on 07/06/2015) 60 tablet 1   No facility-administered medications prior to visit.    PAST MEDICAL HISTORY: Past Medical History  Diagnosis Date  . CAD (coronary artery disease)     a. s/p inferior STEMI 12/29/10 with rotablator atherectomy RCA 12/31/10 and DES x 2 RCA;  b. Lexiscan Myoview (11/2013):  Low risk study, no ischemia, inferolateral defect likely consistent with attenuation versus scar.  . Ischemic cardiomyopathy     EF 45%cath 2012 EF55% 5/12; 35% 11/14  . Diabetes mellitus     type 2  . HTN (hypertension)   . Hyperlipidemia   . Diverticulosis   . Respiratory arrest//ACE Inhibitor presumed cause   . COPD (chronic obstructive pulmonary disease)   . Small bowel obstruction  from a sigmoid stricture  . Nephrolithiasis   . Sleep apnea   . Overweight(278.02)   . Osteoarthritis   . GERD (gastroesophageal reflux disease)   . Anxiety   . Glaucoma   . Memory deficit   . Dementia     PAST SURGICAL HISTORY: Past Surgical History  Procedure Laterality Date  . Bilateral tubal ligation    . Lithotripsy    . 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 Cardiac surgery    . Cardiac catheterization N/A 03/18/2015    Procedure: LEFT HEART CATH AND CORONARY ANGIOGRAPHY;  Surgeon: Lorretta Harp, MD;  Location: [facility] CATH LAB;  Service: Cardiovascular;  Laterality: N/A;    FAMILY HISTORY: Family History  Problem Relation Age of Onset  . Breast cancer Maternal Grandmother   . Diabetes Father   . Heart disease Father   . Diabetes Sister   . Colon cancer Neg Hx     SOCIAL HISTORY: Social History   Social History  . Marital Status: Widowed    Spouse Name: N/A  . Number of Children: 2  . Years of Education: 11   Occupational History  . Retired     Haematologist     Social History Main Topics  . Smoking status: Current Every Day Smoker -- 1.00 packs/day for 25 years    Types: Cigarettes  . Smokeless tobacco: Never Used     Comment: over a ppd for 40+ years; quit 10/2004.  unsuccessfully tried eCigs.  . Alcohol Use: No  . Drug Use: No  . Sexual Activity: No   Other Topics Concern  . Not on file   Social History Narrative   Patient is widowed with 2 children.   Patient is right handed.   Patient has 11 th grade education.   Patient drinks 6-7 daily.      PHYSICAL EXAM  Filed Vitals:   07/06/15 1429  BP: 119/73  Pulse: 92  Height: 5\' 5"  (1.651 m)  Weight: 158 lb (71.668 kg)   Body mass index is 26.29 kg/(m^2).   MMSE - Mini Mental State Exam 07/06/2015 07/02/2014  Orientation to time 1 2  Orientation to Place 4 3  Registration 3 3  Attention/ Calculation 5 4  Recall 3 0  Language- name 2 objects 1 2  Language- repeat 1 1  Language- follow 3 step command 3 3  Language- read & follow direction 1 1  Write a sentence 1 1  Copy design 0 1  Total score 23 21    Generalized: Well developed, in no acute distress   Neurological examination  Mentation: Alert. Follows all commands speech and language fluent. MMSE 23/30 Cranial nerve II-XII: Pupils were equal round reactive to light. Extraocular movements were full, visual field were full on confrontational test. Facial sensation and strength were normal. Uvula tongue midline. Head turning and shoulder shrug  were normal and symmetric. Motor: The motor testing reveals 5 over 5 strength of all 4 extremities. Good symmetric motor tone is noted throughout.  Sensory: Sensory testing is intact to soft touch on all 4 extremities. No evidence of extinction is noted.  Coordination: Cerebellar testing reveals good finger-nose-finger and heel-to-shin bilaterally.  Gait and station: Gait is slow and cautious. Reflexes: Deep tendon reflexes are symmetric and normal bilaterally.   DIAGNOSTIC  DATA (LABS, IMAGING, TESTING) - I reviewed patient records, labs, notes, testing and imaging myself where available.  Lab Results  Component Value  Date   WBC 8.6 04/29/2015   HGB 15.6* 04/29/2015   HCT 44.4 04/29/2015   MCV 94.9 04/29/2015   PLT 138* 04/29/2015      Component Value Date/Time   NA 139 04/29/2015 1724   K 4.1 04/29/2015 1724   CL 102 04/29/2015 1724   CO2 25 04/29/2015 1724   GLUCOSE 166* 04/29/2015 1724   BUN 15 04/29/2015 1724   CREATININE 0.72 04/29/2015 1724   CALCIUM 8.9 04/29/2015 1724   PROT 6.9 04/29/2015 1724   ALBUMIN 3.6 04/29/2015 1724   AST 19 04/29/2015 1724   ALT 9* 04/29/2015 1724   ALKPHOS 78 04/29/2015 1724   BILITOT 0.4 04/29/2015 1724   GFRNONAA >60 04/29/2015 1724   GFRAA >60 04/29/2015 1724       ASSESSMENT AND PLAN 74 y.o. year old female  has a past medical history of CAD (coronary artery disease); Ischemic cardiomyopathy; Diabetes mellitus; HTN (hypertension); Hyperlipidemia; Diverticulosis; Respiratory arrest//ACE Inhibitor presumed cause; COPD (chronic obstructive pulmonary disease); Small bowel obstruction; Nephrolithiasis; Sleep apnea; Overweight(278.02); Osteoarthritis; GERD (gastroesophageal reflux disease); Anxiety; Glaucoma; Memory deficit; and Dementia. here with:  1. Memory disorder  Overall the patient's memory has remained stable. Her MMSE today is 23/30 was previously 25/30. The patient will continue on Namenda. We will continue to monitor. Patient and daughter are advised that if symptoms worsen or she develops new symptoms they should let us know. She will follow-up in 6 months with Dr. Jannifer Franklin.  I spent 25 minutes with the patient 50% of this time was counseling the patient and her daughter on progression and prognosis of diagnosis.  Ward Givens, MSN, NP-C 07/06/2015, 3:20 PM [facility] [address], [place], [place] [address] [phone]

## 2015-07-06 NOTE — Progress Notes (Signed)
I have read the note, and I agree with the clinical assessment and plan.  Muzammil Bruins KEITH   

## 2015-07-22 ENCOUNTER — Ambulatory Visit (INDEPENDENT_AMBULATORY_CARE_PROVIDER_SITE_OTHER): Payer: Medicare Other | Admitting: Internal Medicine

## 2015-07-22 ENCOUNTER — Encounter: Payer: Self-pay | Admitting: Internal Medicine

## 2015-07-22 VITALS — BP 150/80 | HR 72 | Ht 65.0 in | Wt 159.0 lb

## 2015-07-22 DIAGNOSIS — J449 Chronic obstructive pulmonary disease, unspecified: Secondary | ICD-10-CM

## 2015-07-22 MED ORDER — ALBUTEROL SULFATE (2.5 MG/3ML) 0.083% IN NEBU: 2.5000 mg | INHALATION_SOLUTION | Freq: Four times a day (QID) | RESPIRATORY_TRACT | Status: DC

## 2015-07-22 MED ORDER — BUDESONIDE 0.25 MG/2ML IN SUSP: 0.2500 mg | Freq: Two times a day (BID) | RESPIRATORY_TRACT | Status: DC

## 2015-07-22 MED ORDER — IPRATROPIUM BROMIDE 0.02 % IN SOLN: 0.5000 mg | Freq: Four times a day (QID) | RESPIRATORY_TRACT | Status: DC

## 2015-07-22 NOTE — Progress Notes (Signed)
Subjective:    Patient ID: [name], female    DOB: [birth year], 74 y.o.   MRN: [id number]  HPI  1. Ex Heavy Smoker 2. 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 Refuses spiriva, flu shot, rehab - In 2014: MDI is dulera 3. AECOPD hx - Aug 2011  (office Rx)   OV 06/12/2013    Admitted earlier in [place] for 2 days with worsening cough and yellow sputum for  1 months and dx with LL PNAand AECOPD. Dc on abx and pred taper and improved but cough worsened again. 3 days ago startred on levaquin and pred taper (few week taper) by Ladona Mow, MD. Feeling fatigued overall. COPD CAT score is 29. Can make bed and overall fatigued  REC Have CXR today; will call with result. - > CLEARED Finish levaquin and prednisone ordered by Vidal Schwalbe, MD Return to see me or NP [name]  in 3-4 weeks with COPD CAT Score at followup  07/10/2013 Follow up   [name], after a left lower lobe pneumonia. She finished her antibiotics with Levaquin. Chest x-ray showed a clearance of her left lower lobe infiltrate. Patient states that she is improved with decreased shortness, of breath, and congestion. However, continues to have a persistent dry cough. She is requesting a cough syrup. She is taking Mucinex DM without much relief. She is also use Tessalon the past, but has been intolerant to this. Patient denies any hemoptysis, orthopnea, PND, or increased leg swelling. Unfortunately, patient continues to smoke. Smoking cessation was discussed in detail.  CAT score 26 today .   REC Must quit smoking .  Mucinex DM Twice daily As needed Cough/congestion  Continue on Dulera 2 puffs Twice daily  May use Hydromet 1/2-1 tsp every 8hr As needed Cough , may make you sleepy (do not use with other pain meds )  follow up Dr. Chase Caller in 2-3 months and As needed  Please contact office for sooner follow  up if symptoms do not improve or worsen or seek emergency care      OV 09/09/2013 Had acute  shortness of breath and was taken to the emergency department 07/22/2013. Blood work reviewed and showed normal chemistry and normal CBC. Is no troponin check. A chest x-ray showed possible left pleural effusion but otherwise hyperinflated chest. However to me the chest x-ray shows either a small effusion or left lower lobe atelectasis which is similar to July 2014 and improved from the one prior to that and she had a pneumonia diagnoses in early July 2014. I do note that she's never had a CT scan of the chest. Patient was discharged 10 days of Levaquin and a prednisone taper which he still on. SHe continues to be symptomatiuc wit CAT Scoire 26   Of note she continues to smoke   OV 09/09/2013  She presents with her grand daughter for followup. She had CT scan of the chest in 07/31/2013 that shows only right lower lobe resolving pneumonia. She still continues to have severe cough. There is a baseline element to it with periodic exacerbations. Granddaughter does not feel that [name] helps is not completely. Cough is most dry. There is associated sinus drainage and ticklish sensation in the throat and she does not treat this. There is associated acid reflux. She is noted to be on beta blocker eyedrops but the granddaughter  says she's not compliant with this and does not think this is making her cough worse. Is only an occasional gag  Otherwise COPD is at baseline    10/30/2013 [facility] follow up  [name].  Patient was admitted November 21 through November 25, for a COPD exacerbation and CHF .  Admitted to [facility] where workup revealed new systolic dysfunction. ECHO with EF of 30-35%, diffuse hypokiniesis, grade 1 diastolic dysfunction, mild MR. Cardiology consulted for formal evaluation. Patients CHF regimen adjusted while inpatient (see medication Rx  below). She was placed on scheduled bronchodilators, IV steroids, and doxycycline for AECOPD. Empirically diuresed, BNP (mild elevation) & troponin trended (negative). Lower extremity dopplers were evaluated and negative for DVT.  Continues to have prod cough with yellow mucus, increased SOB, wheezing, chest tightness x2 days. Will finish Levaquin 500mg  on 12/14 by PCP.  Says that she does feel slightly better, however, continues to have some cough and congestion.  She denies any hemoptysis, orthopnea, PND, or increased leg swelling  Patient continues to smoke. Smoking cessation was encouraged.  Does complain of intermittent constipation  Patient had a CT scan planned for next week to followup lung nodules.    OV 12/02/2013  Chief Complaint  Patient presents with  . Follow-up    Pt states atrovent neb really helped her sob and cough. Pt is not using it as needed.  Pt is currently on cipro for UTI.     Presents with gran-niece [name]. She is doing well after dx of CHF and starteing lasix in Nov 2014. aLso dx with stenotrohomonas around thanskgiving dyring CHF admit and Rx with levaquin. Then early dec 2014 started on atrovent neb and after that dyspnea mich improved but  Grand-niece says hard to get her to be compliant so not doing atrovent. Overall well now. COPD CAT score is 9 and best ever     CT chest 11/05/13   - IMPRESSION:  1. Interval improvement an right lower lobe airspace consolidation.  2. Persistent, bilateral lower lobe tree-in-bud nodularity and   bronchial wall thickening which likely reflects chronic, I  inflammatory or infectious bronchiolitis.  3. 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  On: 11/05/2013 13:21     OV 07/22/2015  Chief Complaint  Patient presents with  . Follow-up    Pt states her breathing has recently worsened. Pt c/o increase in anxiety, increase in SOB, mild non prod  cough. Pt denies CP/tightness.     Follow-up COPD with the associated chronic systolic heart failure and significant physical deconditioning  I have not seen her since January 2015. She presents with grandniece [name]. Overall since gender 51 according to the grandniece that has been to ER visits for subjective feelings of dyspnea and one hospitalization which resulted in a cardiac cath that was normal according to the history he did no history of any stents being placed. She continues to languish at home. She is mostly physically deconditioned. COPD stable. The main reason for visit is for refill of nebulizers. Most important thing is significant physical deconditioning. Patient is very limited in her activities of daily living although can self-care with some help. She walks around inside the house and gets sick easily fatigued. She is not interested in outpatient pulmonary rehabilitation and in fact might be too weak for it. Last physical therapy was over a year ago at home. [name]  and post smoking takes nebulizer placebo for subjective relief of dyspnea. She is only on DuoNeb 4 times daily. She's never tried inhaled or nebulized steroid's    Review of Systems  Constitutional: Negative for fever and unexpected weight change.  HENT: Negative for congestion, dental problem, ear pain, nosebleeds, postnasal drip, rhinorrhea, sinus pressure, sneezing, sore throat and trouble swallowing.   Eyes: Negative for redness and itching.  Respiratory: Positive for cough and shortness of breath. Negative for chest tightness and wheezing.   Cardiovascular: Negative for palpitations and leg swelling.  Gastrointestinal: Negative for nausea and vomiting.  Genitourinary: Negative for dysuria.  Musculoskeletal: Negative for joint swelling.  Skin: Negative for rash.  Neurological: Negative for headaches.  Hematological: Does not bruise/bleed easily.  Psychiatric/Behavioral: Negative for dysphoric  mood. The patient is not nervous/anxious.        Objective:   Physical Exam  Constitutional: She is oriented to person, place, and time. No distress.  Physically deconditioned Sitting in wheel chair Chronic ill-looking  HENT:  Head: Normocephalic and atraumatic.  Right Ear: External ear normal.  Left Ear: External ear normal.  Mouth/Throat: Oropharynx is clear and moist. No oropharyngeal exudate.  Eyes: Conjunctivae and EOM are normal. Pupils are equal, round, and reactive to light. Right eye exhibits no discharge. Left eye exhibits no discharge. No scleral icterus.  Neck: Normal range of motion. Neck supple. No JVD present. No tracheal deviation present. No thyromegaly present.  Cardiovascular: Normal rate, regular rhythm, normal heart sounds and intact distal pulses.  Exam reveals no gallop and no friction rub.   No murmur heard. Pulmonary/Chest: Effort normal and breath sounds normal. No respiratory distress. She has no wheezes. She has no rales. She exhibits no tenderness.  Abdominal: Soft. Bowel sounds are normal. She exhibits no distension and no mass. There is no tenderness. There is no rebound and no guarding.  Musculoskeletal: Normal range of motion. She exhibits edema. She exhibits no tenderness.  Lymphadenopathy:    She has no cervical adenopathy.  Neurological: She is alert and oriented to person, place, and time. She has normal reflexes. No cranial nerve deficit. She exhibits normal muscle tone. Coordination normal.  Skin: Skin is dry. No rash noted. She is not diaphoretic. No erythema. No pallor.  Mild flaky skin  Psychiatric: She has a normal mood and affect. Her behavior is normal. Judgment and thought content normal.  Vitals reviewed.   Filed Vitals:   07/22/15 1211  BP: 150/80  Pulse: 72  Height: 5\' 5"  (1.651 m)  Weight: 159 lb (72.122 kg)  SpO2: 94%         Assessment & Plan:     ICD-9-CM ICD-10-CM   1. Chronic obstructive pulmonary disease,  unspecified COPD, unspecified chronic bronchitis type 496 J44.9    Copd is stable You are physically deconditioned  Plan Continue albuterol-Atrovent nebulizer 4 times daily Start Pulmicort 0.25 mg nebulizer twice daily-if expensive let us know Refer home physical therapy Flu shot in the fall  Follow-up 6 months or sooner if needed   Dr. Brand Males, M.D., [facility].C.P Pulmonary and Critical Care Medicine Staff Physician [facility] Pager: [phone], If no answer or between  15:00h - 7:00h: call [phone]  07/22/2015 12:35 PM

## 2015-07-22 NOTE — Patient Instructions (Signed)
ICD-9-CM ICD-10-CM   1. Chronic obstructive pulmonary disease, unspecified COPD, unspecified chronic bronchitis type 496 J44.9    Copd is stable You are physically deconditioned  Plan Continue albuterol-Atrovent nebulizer 4 times daily Start Pulmicort 0.25 mg nebulizer twice daily-if expensive let us know Refer home physical therapy Flu shot in the fall  Follow-up 6 months or sooner if needed

## 2015-08-16 ENCOUNTER — Emergency Department (HOSPITAL_COMMUNITY)
Admission: EM | Admit: 2015-08-16 | Discharge: 2015-08-17 | Disposition: A | Discharge status: Home / Self Care | Payer: Medicare Other | Attending: Emergency Medicine | Admitting: Emergency Medicine

## 2015-08-16 DIAGNOSIS — E663 Overweight: Secondary | ICD-10-CM | POA: Insufficient documentation

## 2015-08-16 DIAGNOSIS — I2510 Atherosclerotic heart disease of native coronary artery without angina pectoris: Secondary | ICD-10-CM | POA: Insufficient documentation

## 2015-08-16 DIAGNOSIS — Z7951 Long term (current) use of inhaled steroids: Secondary | ICD-10-CM | POA: Insufficient documentation

## 2015-08-16 DIAGNOSIS — H9201 Otalgia, right ear: Secondary | ICD-10-CM | POA: Diagnosis not present

## 2015-08-16 DIAGNOSIS — J449 Chronic obstructive pulmonary disease, unspecified: Secondary | ICD-10-CM | POA: Insufficient documentation

## 2015-08-16 DIAGNOSIS — Z7982 Long term (current) use of aspirin: Secondary | ICD-10-CM | POA: Insufficient documentation

## 2015-08-16 DIAGNOSIS — Z720 Tobacco use: Secondary | ICD-10-CM | POA: Insufficient documentation

## 2015-08-16 DIAGNOSIS — Z87442 Personal history of urinary calculi: Secondary | ICD-10-CM | POA: Insufficient documentation

## 2015-08-16 DIAGNOSIS — I10 Essential (primary) hypertension: Secondary | ICD-10-CM | POA: Diagnosis not present

## 2015-08-16 DIAGNOSIS — Z794 Long term (current) use of insulin: Secondary | ICD-10-CM | POA: Diagnosis not present

## 2015-08-16 DIAGNOSIS — Z9889 Other specified postprocedural states: Secondary | ICD-10-CM | POA: Diagnosis not present

## 2015-08-16 DIAGNOSIS — Z8669 Personal history of other diseases of the nervous system and sense organs: Secondary | ICD-10-CM | POA: Insufficient documentation

## 2015-08-16 DIAGNOSIS — F419 Anxiety disorder, unspecified: Secondary | ICD-10-CM | POA: Diagnosis not present

## 2015-08-16 DIAGNOSIS — F0390 Unspecified dementia without behavioral disturbance: Secondary | ICD-10-CM | POA: Diagnosis not present

## 2015-08-16 DIAGNOSIS — Z880 Allergy status to penicillin: Secondary | ICD-10-CM | POA: Diagnosis not present

## 2015-08-16 DIAGNOSIS — M1990 Unspecified osteoarthritis, unspecified site: Secondary | ICD-10-CM | POA: Diagnosis not present

## 2015-08-16 DIAGNOSIS — E119 Type 2 diabetes mellitus without complications: Secondary | ICD-10-CM | POA: Diagnosis not present

## 2015-08-16 DIAGNOSIS — Z79899 Other long term (current) drug therapy: Secondary | ICD-10-CM | POA: Insufficient documentation

## 2015-08-16 DIAGNOSIS — E785 Hyperlipidemia, unspecified: Secondary | ICD-10-CM | POA: Diagnosis not present

## 2015-08-16 DIAGNOSIS — Z8719 Personal history of other diseases of the digestive system: Secondary | ICD-10-CM | POA: Insufficient documentation

## 2015-08-17 ENCOUNTER — Encounter (HOSPITAL_COMMUNITY): Payer: Self-pay

## 2015-08-17 NOTE — ED Notes (Signed)
Pt complains of right ear pain

## 2015-08-17 NOTE — ED Provider Notes (Signed)
CSN: [id number]     Arrival date & time 08/16/15  2341 History   First MD Initiated Contact with Patient 08/17/15 0009     Chief Complaint  Patient presents with  . Otalgia     (Consider location/radiation/quality/duration/timing/severity/associated sxs/prior Treatment) Patient is a 74 y.o. female presenting with ear pain. The history is provided by the patient.  Otalgia Location:  Right Behind ear:  No abnormality Quality:  Dull Severity:  Moderate Onset quality:  Gradual Duration:  2 days Timing:  Constant Progression:  Unchanged Context: not direct blow, not elevation change, not foreign body in ear and no water in ear   Associated symptoms: no congestion, no ear discharge, no fever, no headaches, no hearing loss, no rhinorrhea, no sore throat and no tinnitus     Past Medical History  Diagnosis Date  . CAD (coronary artery disease)     a. s/p inferior STEMI 12/29/10 with rotablator atherectomy RCA 12/31/10 and DES x 2 RCA;  b. Lexiscan Myoview (11/2013):  Low risk study, no ischemia, inferolateral defect likely consistent with attenuation versus scar.  . Ischemic cardiomyopathy     EF 45%cath 2012 EF55% 5/12; 35% 11/14  . Diabetes mellitus     type 2  . HTN (hypertension)   . Hyperlipidemia   . Diverticulosis   . Respiratory arrest//ACE Inhibitor presumed cause   . COPD (chronic obstructive pulmonary disease)   . Small bowel obstruction     from a sigmoid stricture  . Nephrolithiasis   . Sleep apnea   . Overweight(278.02)   . Osteoarthritis   . GERD (gastroesophageal reflux disease)   . Anxiety   . Glaucoma   . Memory deficit   . Dementia    Past Surgical History  Procedure Laterality Date  . Bilateral tubal ligation    . Lithotripsy    . 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 Cardiac surgery    . Cardiac catheterization N/A 03/18/2015    Procedure: LEFT HEART CATH AND CORONARY ANGIOGRAPHY;  Surgeon: Lorretta Harp, MD;   Location: [facility] CATH LAB;  Service: Cardiovascular;  Laterality: N/A;   Family History  Problem Relation Age of Onset  . Breast cancer Maternal Grandmother   . Diabetes Father   . Heart disease Father   . Diabetes Sister   . Colon cancer Neg Hx    Social History  Substance Use Topics  . Smoking status: Current Every Day Smoker -- 1.00 packs/day for 25 years    Types: Cigarettes  . Smokeless tobacco: Never Used     Comment: over a ppd for 40+ years; quit 10/2004.  unsuccessfully tried eCigs.  . Alcohol Use: No   OB History    No data available     Review of Systems  Constitutional: Negative for fever.  HENT: Positive for ear pain. Negative for congestion, ear discharge, hearing loss, rhinorrhea, sore throat, tinnitus and trouble swallowing.   Neurological: Negative for dizziness and headaches.      Allergies  Lisinopril; Spironolactone; Donepezil; Eggs or egg-derived products; Erythromycin; Macrolides and ketolides; Penicillins; Quinapril hcl; Angiotensin receptor blockers; Pentazocine lactate; Propoxyphene hcl; and Sulfonamide derivatives  Home Medications   Prior to Admission medications   Medication Sig Start Date End Date Taking? Authorizing Provider  albuterol (PROVENTIL HFA;VENTOLIN HFA) 108 (90 BASE) MCG/ACT inhaler Inhale 1-2 puffs into the lungs every 6 (six) hours as needed for wheezing.     Historical Provider, MD  albuterol (PROVENTIL) (2.5 MG/3ML)  0.083% nebulizer solution Take 3 mLs (2.5 mg total) by nebulization 4 (four) times daily. 07/22/15   Brand Males, MD  aspirin EC 81 MG tablet Take 81 mg by mouth daily.    Historical Provider, MD  budesonide (PULMICORT) 0.25 MG/2ML nebulizer solution Take 2 mLs (0.25 mg total) by nebulization 2 (two) times daily. 07/22/15   Brand Males, MD  clopidogrel (PLAVIX) 75 MG tablet TAKE 1 TABLET BY MOUTH EVERY DAY 11/11/14   Burnell Blanks, MD  furosemide (LASIX) 20 MG tablet Take 1 tablet (20 mg total) by mouth as  needed. 10/28/13   Liliane Shi, PA-C  insulin glargine (LANTUS) 100 UNIT/ML injection Inject 0.1 mLs (10 Units total) into the skin daily. 01/25/14   Hosie Poisson, MD  insulin lispro (HUMALOG) 100 UNIT/ML injection Inject 0-10 Units into the skin 3 (three) times daily before meals. Sliding scale: below 150=0 units, 150-200=2 units, 201-250=4 units, 251-300=6 units, 301-350=8 units, above 350=10 units    Historical Provider, MD  ipratropium (ATROVENT) 0.02 % nebulizer solution Take 2.5 mLs (0.5 mg total) by nebulization 4 (four) times daily. DX CODE J44.9 07/22/15   Brand Males, MD  LORazepam (ATIVAN) 0.5 MG tablet Take 0.5 mg by mouth 2 (two) times daily as needed for anxiety.    Historical Provider, MD  metFORMIN (GLUCOPHAGE) 500 MG tablet Take 2,000 mg by mouth daily before supper. Taking 4 pills at dinner    Historical Provider, MD  metoprolol succinate (TOPROL-XL) 25 MG 24 hr tablet Take 1 tablet (25 mg total) by mouth daily. 10/14/13   Donita Brooks, NP  mometasone-formoterol (DULERA) 100-5 MCG/ACT AERO Inhale 2 puffs into the lungs 2 (two) times daily.    Historical Provider, MD  NAMENDA XR 28 MG CP24 24 hr capsule TAKE ONE CAPSULE BY MOUTH EVERY DAY 02/08/15   Ward Givens, NP  nitroGLYCERIN (NITROSTAT) 0.4 MG SL tablet Place 1 tablet (0.4 mg total) under the tongue every 5 (five) minutes as needed. For chest pain. 06/18/13   Burnell Blanks, MD  PARoxetine (PAXIL) 10 MG tablet Take 10 mg by mouth daily.    Historical Provider, MD  pravastatin (PRAVACHOL) 40 MG tablet TAKE 1 TABLET (40 MG TOTAL) BY MOUTH DAILY. <PLEASE MAKE APPOINTMENT FOR REFILLS> 12/25/14   Burnell Blanks, MD  pregabalin (LYRICA) 50 MG capsule Take 50 mg by mouth 2 (two) times daily.    Historical Provider, MD   BP 164/80 mmHg  Pulse 87  Temp(Src) 97.5 F (36.4 C) (Oral)  Resp 18  SpO2 95% Physical Exam  Constitutional: She appears well-developed and well-nourished.  HENT:  Head: Normocephalic.   Right Ear: External ear normal. No drainage, swelling or tenderness. No mastoid tenderness. Tympanic membrane is not injected and not scarred. No middle ear effusion.  Left Ear: External ear normal. No drainage, swelling or tenderness. No mastoid tenderness. Tympanic membrane is not injected, not scarred, not perforated, not erythematous and not bulging. Tympanic membrane mobility is normal.  No middle ear effusion.  Mouth/Throat: Oropharynx is clear and moist. No oropharyngeal exudate.  Eyes: Pupils are equal, round, and reactive to light.  Neck: Normal range of motion.  Cardiovascular: Normal rate.   Pulmonary/Chest: Effort normal.  Musculoskeletal: Normal range of motion. She exhibits no edema or tenderness.  Lymphadenopathy:    She has no cervical adenopathy.  Neurological: She is alert.  Skin: Skin is warm and dry.  Nursing note and vitals reviewed.   ED Course  Procedures (including critical  care time) Labs Review Labs Reviewed - No data to display  Imaging Review No results found. I have personally reviewed and evaluated these images and lab results as part of my medical decision-making.   EKG Interpretation None     Both ear appear normal without bulging TM, normal canals, no cervical adenopathy Patient to follow up with her ENT MDM   Final diagnoses:  Otalgia of right ear         Junius Creamer, NP 08/17/15 0022  Everlene Balls, MD 08/17/15 [id number]

## 2015-08-17 NOTE — Discharge Instructions (Signed)
No cause for your ear pain has been identified tonight Try keeping the cotton ball in to keep the ear warm make an appointment with your ear doctor for further evaluation as needed

## 2015-09-23 ENCOUNTER — Other Ambulatory Visit: Payer: Self-pay | Admitting: Internal Medicine

## 2015-10-06 ENCOUNTER — Observation Stay (HOSPITAL_COMMUNITY)
Admission: EM | Admit: 2015-10-06 | Discharge: 2015-10-07 | Disposition: A | Discharge status: Home / Self Care | Payer: Medicare Other | Attending: Internal Medicine | Admitting: Internal Medicine

## 2015-10-06 ENCOUNTER — Emergency Department (HOSPITAL_COMMUNITY): Payer: Medicare Other

## 2015-10-06 ENCOUNTER — Emergency Department (HOSPITAL_BASED_OUTPATIENT_CLINIC_OR_DEPARTMENT_OTHER): Admit: 2015-10-06 | Discharge: 2015-10-06 | Disposition: A | Discharge status: Home / Self Care | Payer: Medicare Other

## 2015-10-06 ENCOUNTER — Encounter (HOSPITAL_COMMUNITY): Payer: Self-pay | Admitting: *Deleted

## 2015-10-06 DIAGNOSIS — Z79899 Other long term (current) drug therapy: Secondary | ICD-10-CM | POA: Insufficient documentation

## 2015-10-06 DIAGNOSIS — R600 Localized edema: Secondary | ICD-10-CM | POA: Diagnosis not present

## 2015-10-06 DIAGNOSIS — Z7982 Long term (current) use of aspirin: Secondary | ICD-10-CM | POA: Insufficient documentation

## 2015-10-06 DIAGNOSIS — R000 Tachycardia, unspecified: Secondary | ICD-10-CM | POA: Insufficient documentation

## 2015-10-06 DIAGNOSIS — E669 Obesity, unspecified: Secondary | ICD-10-CM | POA: Diagnosis not present

## 2015-10-06 DIAGNOSIS — M1990 Unspecified osteoarthritis, unspecified site: Secondary | ICD-10-CM | POA: Insufficient documentation

## 2015-10-06 DIAGNOSIS — E785 Hyperlipidemia, unspecified: Secondary | ICD-10-CM | POA: Insufficient documentation

## 2015-10-06 DIAGNOSIS — E119 Type 2 diabetes mellitus without complications: Secondary | ICD-10-CM | POA: Diagnosis not present

## 2015-10-06 DIAGNOSIS — Z9889 Other specified postprocedural states: Secondary | ICD-10-CM | POA: Insufficient documentation

## 2015-10-06 DIAGNOSIS — N390 Urinary tract infection, site not specified: Secondary | ICD-10-CM | POA: Diagnosis present

## 2015-10-06 DIAGNOSIS — M79609 Pain in unspecified limb: Secondary | ICD-10-CM

## 2015-10-06 DIAGNOSIS — J449 Chronic obstructive pulmonary disease, unspecified: Secondary | ICD-10-CM | POA: Diagnosis present

## 2015-10-06 DIAGNOSIS — G4730 Sleep apnea, unspecified: Secondary | ICD-10-CM | POA: Diagnosis not present

## 2015-10-06 DIAGNOSIS — R002 Palpitations: Secondary | ICD-10-CM | POA: Diagnosis present

## 2015-10-06 DIAGNOSIS — Z880 Allergy status to penicillin: Secondary | ICD-10-CM | POA: Insufficient documentation

## 2015-10-06 DIAGNOSIS — F17210 Nicotine dependence, cigarettes, uncomplicated: Secondary | ICD-10-CM | POA: Diagnosis not present

## 2015-10-06 DIAGNOSIS — K219 Gastro-esophageal reflux disease without esophagitis: Secondary | ICD-10-CM | POA: Diagnosis not present

## 2015-10-06 DIAGNOSIS — I5022 Chronic systolic (congestive) heart failure: Secondary | ICD-10-CM | POA: Diagnosis not present

## 2015-10-06 DIAGNOSIS — I10 Essential (primary) hypertension: Secondary | ICD-10-CM | POA: Diagnosis not present

## 2015-10-06 DIAGNOSIS — Z794 Long term (current) use of insulin: Secondary | ICD-10-CM | POA: Diagnosis not present

## 2015-10-06 DIAGNOSIS — N200 Calculus of kidney: Secondary | ICD-10-CM | POA: Insufficient documentation

## 2015-10-06 DIAGNOSIS — K5790 Diverticulosis of intestine, part unspecified, without perforation or abscess without bleeding: Secondary | ICD-10-CM | POA: Diagnosis not present

## 2015-10-06 DIAGNOSIS — I2510 Atherosclerotic heart disease of native coronary artery without angina pectoris: Secondary | ICD-10-CM | POA: Insufficient documentation

## 2015-10-06 DIAGNOSIS — Z7902 Long term (current) use of antithrombotics/antiplatelets: Secondary | ICD-10-CM | POA: Insufficient documentation

## 2015-10-06 DIAGNOSIS — F0390 Unspecified dementia without behavioral disturbance: Secondary | ICD-10-CM

## 2015-10-06 DIAGNOSIS — E663 Overweight: Secondary | ICD-10-CM | POA: Insufficient documentation

## 2015-10-06 DIAGNOSIS — I255 Ischemic cardiomyopathy: Secondary | ICD-10-CM | POA: Insufficient documentation

## 2015-10-06 DIAGNOSIS — J441 Chronic obstructive pulmonary disease with (acute) exacerbation: Secondary | ICD-10-CM | POA: Insufficient documentation

## 2015-10-06 DIAGNOSIS — R079 Chest pain, unspecified: Principal | ICD-10-CM | POA: Insufficient documentation

## 2015-10-06 HISTORY — DX: Obesity, unspecified: E66.9

## 2015-10-06 HISTORY — DX: Unspecified dementia, unspecified severity, without behavioral disturbance, psychotic disturbance, mood disturbance, and anxiety: F03.90

## 2015-10-06 LAB — CBC WITH DIFFERENTIAL/PLATELET
BASOS ABS: 0 10*3/uL (ref 0.0–0.1)
Basophils Relative: 0 %
Eosinophils Absolute: 0.1 10*3/uL (ref 0.0–0.7)
Eosinophils Relative: 1 %
HEMATOCRIT: 45.6 % (ref 36.0–46.0)
HEMOGLOBIN: 15.4 g/dL — AB (ref 12.0–15.0)
LYMPHS ABS: 2.1 10*3/uL (ref 0.7–4.0)
LYMPHS PCT: 17 %
MCH: 32.8 pg (ref 26.0–34.0)
MCHC: 33.8 g/dL (ref 30.0–36.0)
MCV: 97 fL (ref 78.0–100.0)
Monocytes Absolute: 0.7 10*3/uL (ref 0.1–1.0)
Monocytes Relative: 6 %
NEUTROS ABS: 9.2 10*3/uL — AB (ref 1.7–7.7)
NEUTROS PCT: 76 %
Platelets: 166 10*3/uL (ref 150–400)
RBC: 4.7 MIL/uL (ref 3.87–5.11)
RDW: 13 % (ref 11.5–15.5)
WBC: 12.1 10*3/uL — AB (ref 4.0–10.5)

## 2015-10-06 LAB — COMPREHENSIVE METABOLIC PANEL WITH GFR
ALT: 9 U/L — ABNORMAL LOW (ref 14–54)
AST: 15 U/L (ref 15–41)
Albumin: 3.7 g/dL (ref 3.5–5.0)
Alkaline Phosphatase: 62 U/L (ref 38–126)
Anion gap: 11 (ref 5–15)
BUN: 19 mg/dL (ref 6–20)
CO2: 25 mmol/L (ref 22–32)
Calcium: 9.2 mg/dL (ref 8.9–10.3)
Chloride: 106 mmol/L (ref 101–111)
Creatinine, Ser: 0.94 mg/dL (ref 0.44–1.00)
GFR calc Af Amer: >60 mL/min
GFR calc non Af Amer: 59 mL/min — ABNORMAL LOW
Glucose, Bld: 179 mg/dL — ABNORMAL HIGH (ref 65–99)
Potassium: 3.5 mmol/L (ref 3.5–5.1)
Sodium: 142 mmol/L (ref 135–145)
Total Bilirubin: 0.6 mg/dL (ref 0.3–1.2)
Total Protein: 6.9 g/dL (ref 6.5–8.1)

## 2015-10-06 LAB — GLUCOSE, CAPILLARY
GLUCOSE-CAPILLARY: 132 mg/dL — AB (ref 65–99)
GLUCOSE-CAPILLARY: 128 mg/dL — AB (ref 65–99)

## 2015-10-06 LAB — URINALYSIS, ROUTINE W REFLEX MICROSCOPIC
Bilirubin Urine: NEGATIVE
GLUCOSE, UA: NEGATIVE mg/dL
Ketones, ur: 40 mg/dL — AB
NITRITE: POSITIVE — AB
PROTEIN: 30 mg/dL — AB
SPECIFIC GRAVITY, URINE: 1.025 (ref 1.005–1.030)
pH: 5 (ref 5.0–8.0)

## 2015-10-06 LAB — URINE MICROSCOPIC-ADD ON

## 2015-10-06 LAB — D-DIMER, QUANTITATIVE: D-Dimer, Quant: 0.3 ug{FEU}/mL (ref 0.00–0.50)

## 2015-10-06 LAB — PROTIME-INR
INR: 1.08 (ref 0.00–1.49)
Prothrombin Time: 14.2 seconds (ref 11.6–15.2)

## 2015-10-06 LAB — TROPONIN I

## 2015-10-06 LAB — I-STAT CG4 LACTIC ACID, ED: Lactic Acid, Venous: 1.51 mmol/L (ref 0.5–2.0)

## 2015-10-06 MED ORDER — ACETAMINOPHEN 325 MG PO TABS: 650.0000 mg | ORAL_TABLET | ORAL | Status: DC | PRN

## 2015-10-06 MED ORDER — ENOXAPARIN SODIUM 40 MG/0.4ML ~~LOC~~ SOLN
40.0000 mg | SUBCUTANEOUS | Status: DC
  Administered 2015-10-06: 40 mg via SUBCUTANEOUS
  Filled 2015-10-06 (×2): qty 0.4

## 2015-10-06 MED ORDER — ASPIRIN 81 MG PO CHEW
324.0000 mg | CHEWABLE_TABLET | Freq: Once | ORAL | Status: AC
Start: 2015-10-06 — End: 2015-10-06
  Administered 2015-10-06: 324 mg via ORAL
  Filled 2015-10-06: qty 4

## 2015-10-06 MED ORDER — PRAVASTATIN SODIUM 40 MG PO TABS
40.0000 mg | ORAL_TABLET | Freq: Every day | ORAL | Status: DC
  Administered 2015-10-06: 40 mg via ORAL
  Filled 2015-10-06: qty 1

## 2015-10-06 MED ORDER — METOPROLOL SUCCINATE ER 25 MG PO TB24
25.0000 mg | ORAL_TABLET | Freq: Every day | ORAL | Status: DC
  Administered 2015-10-07: 25 mg via ORAL
  Filled 2015-10-06: qty 1

## 2015-10-06 MED ORDER — MOMETASONE FURO-FORMOTEROL FUM 100-5 MCG/ACT IN AERO
2.0000 | INHALATION_SPRAY | Freq: Two times a day (BID) | RESPIRATORY_TRACT | Status: DC
  Filled 2015-10-06: qty 8.8

## 2015-10-06 MED ORDER — ALBUTEROL SULFATE (2.5 MG/3ML) 0.083% IN NEBU: 3.0000 mL | INHALATION_SOLUTION | Freq: Four times a day (QID) | RESPIRATORY_TRACT | Status: DC | PRN

## 2015-10-06 MED ORDER — PAROXETINE HCL 20 MG PO TABS
10.0000 mg | ORAL_TABLET | Freq: Every day | ORAL | Status: DC
  Administered 2015-10-06 – 2015-10-07 (×2): 10 mg via ORAL
  Filled 2015-10-06 (×2): qty 1

## 2015-10-06 MED ORDER — INSULIN ASPART 100 UNIT/ML ~~LOC~~ SOLN
0.0000 [IU] | Freq: Three times a day (TID) | SUBCUTANEOUS | Status: DC
  Administered 2015-10-07: 2 [IU] via SUBCUTANEOUS

## 2015-10-06 MED ORDER — CLOPIDOGREL BISULFATE 75 MG PO TABS
75.0000 mg | ORAL_TABLET | Freq: Every day | ORAL | Status: DC
  Administered 2015-10-07: 75 mg via ORAL
  Filled 2015-10-06: qty 1

## 2015-10-06 MED ORDER — SODIUM CHLORIDE 0.9 % IV BOLUS (SEPSIS)
500.0000 mL | Freq: Once | INTRAVENOUS | Status: AC
  Administered 2015-10-06: 500 mL via INTRAVENOUS

## 2015-10-06 MED ORDER — INSULIN ASPART 100 UNIT/ML ~~LOC~~ SOLN: 0.0000 [IU] | Freq: Every day | SUBCUTANEOUS | Status: DC

## 2015-10-06 MED ORDER — MEMANTINE HCL ER 28 MG PO CP24
28.0000 mg | ORAL_CAPSULE | Freq: Every day | ORAL | Status: DC
  Administered 2015-10-07: 28 mg via ORAL
  Filled 2015-10-06: qty 1

## 2015-10-06 MED ORDER — PREGABALIN 50 MG PO CAPS
50.0000 mg | ORAL_CAPSULE | Freq: Two times a day (BID) | ORAL | Status: DC
  Administered 2015-10-06 – 2015-10-07 (×2): 50 mg via ORAL
  Filled 2015-10-06 (×2): qty 1

## 2015-10-06 MED ORDER — ONDANSETRON HCL 4 MG/2ML IJ SOLN: 4.0000 mg | Freq: Four times a day (QID) | INTRAMUSCULAR | Status: DC | PRN

## 2015-10-06 MED ORDER — ASPIRIN EC 81 MG PO TBEC
81.0000 mg | DELAYED_RELEASE_TABLET | Freq: Every day | ORAL | Status: DC
  Administered 2015-10-07: 81 mg via ORAL
  Filled 2015-10-06: qty 1

## 2015-10-06 MED ORDER — METFORMIN HCL 500 MG PO TABS
2000.0000 mg | ORAL_TABLET | Freq: Every day | ORAL | Status: DC
  Administered 2015-10-06: 2000 mg via ORAL
  Filled 2015-10-06: qty 4

## 2015-10-06 MED ORDER — CIPROFLOXACIN HCL 500 MG PO TABS
500.0000 mg | ORAL_TABLET | Freq: Two times a day (BID) | ORAL | Status: DC
  Administered 2015-10-06 – 2015-10-07 (×2): 500 mg via ORAL
  Filled 2015-10-06 (×2): qty 1

## 2015-10-06 MED ORDER — NITROGLYCERIN 0.4 MG SL SUBL: 0.4000 mg | SUBLINGUAL_TABLET | SUBLINGUAL | Status: DC | PRN

## 2015-10-06 MED ORDER — ALBUTEROL SULFATE (2.5 MG/3ML) 0.083% IN NEBU
2.5000 mg | INHALATION_SOLUTION | Freq: Four times a day (QID) | RESPIRATORY_TRACT | Status: DC
  Administered 2015-10-06: 2.5 mg via RESPIRATORY_TRACT
  Filled 2015-10-06 (×2): qty 3

## 2015-10-06 MED ORDER — LORAZEPAM 0.5 MG PO TABS: 0.5000 mg | ORAL_TABLET | Freq: Two times a day (BID) | ORAL | Status: DC | PRN

## 2015-10-06 MED ORDER — INSULIN GLARGINE 100 UNIT/ML ~~LOC~~ SOLN
10.0000 [IU] | Freq: Every day | SUBCUTANEOUS | Status: DC
  Administered 2015-10-07: 10 [IU] via SUBCUTANEOUS
  Filled 2015-10-06: qty 0.1

## 2015-10-06 NOTE — H&P (Signed)
History and Physical  [name] [id number] DOB: 19-Nov-1941 DOA: 10/06/2015  Referring physician: Joretta Bachelor, ER physician PCP: Vidal Schwalbe, MD   Chief Complaint: Chest pressure  HPI: [name] is a 74 y.o. female  With past medical history of CAD, diabetes mellitus and mild senile dementia who states that she's been having intermittent episodes of angina which are not new, but in the last month been occurring more frequently. She describes it as a chest pressure sensation over the left side of her chest which occurs whenever she is doing things requiring more exertion. There is some associated shortness of breath. No radiation. They ease off when she rests. Again they seem to be more crying more frequently. She stayed in the last day or 2 it's actually been easier on her. Nevertheless, she came into the emergency room today for evaluation. Initial EKG and enzymes were unremarkable. She will did note to have a moderate UTI. Hospitalists were called for further evaluation   Review of Systems:  Patient seen after arrival to floor. Pt complains of chest palpitations and left-sided chest pressure with some associate shortness of breath, although the symptoms are not happening right this minute. She also does complain of some dysuria which is noted going on for the last few weeks .  Pt denies any headaches, vision changes, dysphagia, palpitations, wheeze, cough, abdominal pain, hematuria, constipation, diarrhea, focal extremity numbness weakness or pain.  Review of systems are otherwise negative  Past Medical History  Diagnosis Date  . CAD (coronary artery disease)     a. s/p inferior STEMI 12/29/10 with rotablator atherectomy RCA 12/31/10 and DES x 2 RCA;  b. Lexiscan Myoview (11/2013):  Low risk study, no ischemia, inferolateral defect likely consistent with attenuation versus scar.  . Ischemic cardiomyopathy     EF 45%cath 2012 EF55% 5/12; 35% 11/14  . Diabetes mellitus       type 2  . HTN (hypertension)   . Hyperlipidemia   . Diverticulosis   . Respiratory arrest//ACE Inhibitor presumed cause   . COPD (chronic obstructive pulmonary disease) ([place])   . Small bowel obstruction ([facility])     from a sigmoid stricture  . Nephrolithiasis   . Sleep apnea   . Overweight(278.02)   . Osteoarthritis   . GERD (gastroesophageal reflux disease)   . Anxiety   . Glaucoma   . Memory deficit   . Dementia    Past Surgical History  Procedure Laterality Date  . Bilateral tubal ligation    . Lithotripsy    . 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 Cardiac surgery    . Cardiac catheterization N/A 03/18/2015    Procedure: LEFT HEART CATH AND CORONARY ANGIOGRAPHY;  Surgeon: Lorretta Harp, MD;  Location[facility] CATH LAB;  Service: Cardiovascular;  Laterality: N/A;   Social History:  reports that she has been smoking Cigarettes.  She has a 25 pack-year smoking history. She has never used smokeless tobacco. She reports that she does not drink alcohol or use illicit drugs. Patient lives at home with her niece & is able to participate in activities of daily living with use of a cane  Allergies  Allergen Reactions  . Lisinopril Other (See Comments)    REACTION: respiratory arrest jan 2009  . Spironolactone Other (See Comments)    PATIENT HAD SEVERE SIDE EFFECTS  . Donepezil Diarrhea  . Eggs Or Egg-Derived Products Diarrhea  . Erythromycin Other (See Comments)  Makes me feel weird   . Macrolides And Ketolides Other (See Comments)  . Penicillins Itching and Other (See Comments)    Too much as a kid  . Quinapril Hcl Itching and Other (See Comments)    Too much AS A KID  . Angiotensin Receptor Blockers Other (See Comments)    unknown  . Pentazocine Lactate Other (See Comments)    Reaction unknown  . Propoxyphene Hcl Other (See Comments)    Reaction unknown  . Sulfonamide Derivatives Other (See Comments)    Childhood allergy    Family  History  Problem Relation Age of Onset  . Breast cancer Maternal Grandmother   . Diabetes Father   . Heart disease Father   . Diabetes Sister   . Colon cancer Neg Hx       Prior to Admission medications   Medication Sig Start Date End Date Taking? Authorizing Provider  albuterol (PROVENTIL) (2.5 MG/3ML) 0.083% nebulizer solution Take 3 mLs (2.5 mg total) by nebulization 4 (four) times daily. 07/22/15  Yes Brand Males, MD  aspirin EC 81 MG tablet Take 81 mg by mouth daily.   Yes Historical Provider, MD  budesonide (PULMICORT) 0.25 MG/2ML nebulizer solution Take 2 mLs (0.25 mg total) by nebulization 2 (two) times daily. 07/22/15  Yes Brand Males, MD  clopidogrel (PLAVIX) 75 MG tablet TAKE 1 TABLET BY MOUTH EVERY DAY 11/11/14  Yes Burnell Blanks, MD  furosemide (LASIX) 20 MG tablet Take 1 tablet (20 mg total) by mouth as needed. Patient taking differently: Take 20 mg by mouth as needed for fluid.  10/28/13  Yes Scott T Kathlen Mody, PA-C  insulin glargine (LANTUS) 100 UNIT/ML injection Inject 0.1 mLs (10 Units total) into the skin daily. 01/25/14  Yes Hosie Poisson, MD  insulin lispro (HUMALOG) 100 UNIT/ML injection Inject 0-10 Units into the skin 3 (three) times daily before meals. Sliding scale: below 150=0 units, 150-200=2 units, 201-250=4 units, 251-300=6 units, 301-350=8 units, above 350=10 units   Yes Historical Provider, MD  metFORMIN (GLUCOPHAGE) 500 MG tablet Take 2,000 mg by mouth daily before supper.    Yes Historical Provider, MD  metoprolol succinate (TOPROL-XL) 25 MG 24 hr tablet Take 1 tablet (25 mg total) by mouth daily. 10/14/13  Yes Donita Brooks, NP  mometasone-formoterol (DULERA) 100-5 MCG/ACT AERO Inhale 2 puffs into the lungs 2 (two) times daily.   Yes Historical Provider, MD  NAMENDA XR 28 MG CP24 24 hr capsule TAKE ONE CAPSULE BY MOUTH EVERY DAY 02/08/15  Yes Ward Givens, NP  PARoxetine (PAXIL) 10 MG tablet Take 10 mg by mouth daily.   Yes Historical Provider, MD    pravastatin (PRAVACHOL) 40 MG tablet TAKE 1 TABLET (40 MG TOTAL) BY MOUTH DAILY. <PLEASE MAKE APPOINTMENT FOR REFILLS> 12/25/14  Yes Burnell Blanks, MD  pregabalin (LYRICA) 50 MG capsule Take 50 mg by mouth 2 (two) times daily.   Yes Historical Provider, MD  albuterol (PROVENTIL HFA;VENTOLIN HFA) 108 (90 BASE) MCG/ACT inhaler Inhale 1-2 puffs into the lungs every 6 (six) hours as needed for wheezing.     Historical Provider, MD  ipratropium (ATROVENT) 0.02 % nebulizer solution Take 2.5 mLs (0.5 mg total) by nebulization 4 (four) times daily. DX CODE J44.9 Patient taking differently: Take 0.5 mg by nebulization every 6 (six) hours as needed for wheezing.  07/22/15   Brand Males, MD  LORazepam (ATIVAN) 0.5 MG tablet Take 0.5 mg by mouth 2 (two) times daily as needed for anxiety.  Historical Provider, MD  nitroGLYCERIN (NITROSTAT) 0.4 MG SL tablet Place 1 tablet (0.4 mg total) under the tongue every 5 (five) minutes as needed. For chest pain. 06/18/13   Burnell Blanks, MD    Physical Exam: BP 156/67 mmHg  Pulse 89  Temp(Src) 97.7 F (36.5 C) (Oral)  Resp 23  Ht 5\' 1"  (1.549 m)  Wt 72.122 kg (159 lb)  BMI 30.06 kg/m2  SpO2 96%  General:  Alert and oriented 2, no acute distress Eyes: Sclera nonicteric, extraocular movements are intact ENT: Normocephalic, atraumatic, mucous membranes are dry Neck: No JVD Cardiovascular: Regular rhythm, rate controlled Respiratory: Decreased breath sounds throughout Abdomen: Soft, nontender, nondistended, positive bowel sounds Skin: No skin breaks, tears or lesions Musculoskeletal: a clubbing cyanosis or edema Psychiatric: patient is appropriate, no evidence of psychoses Neurologic: no focal deficits           Labs on Admission:  Basic Metabolic Panel:  Recent Labs Lab 10/06/15 1530  NA 142  K 3.5  CL 106  CO2 25  GLUCOSE 179*  BUN 19  CREATININE 0.94  CALCIUM 9.2   Liver Function Tests:  Recent Labs Lab  10/06/15 1530  AST 15  ALT 9*  ALKPHOS 62  BILITOT 0.6  PROT 6.9  ALBUMIN 3.7   No results for input(s): LIPASE, AMYLASE in the last 168 hours. No results for input(s): AMMONIA in the last 168 hours. CBC:  Recent Labs Lab 10/06/15 1530  WBC 12.1*  NEUTROABS 9.2*  HGB 15.4*  HCT 45.6  MCV 97.0  PLT 166   Cardiac Enzymes:  Recent Labs Lab 10/06/15 1530  TROPONINI <0.03    BNP (last 3 results)  Recent Labs  12/30/14 2354 03/16/15 1957 03/31/15 0949  BNP 95.6 107.2* 118.2*    ProBNP (last 3 results) No results for input(s): PROBNP in the last 8760 hours.  CBG:  Recent Labs Lab 10/06/15 1820 10/06/15 2014  GLUCAP 132* 128*    Radiological Exams on Admission: Dg Chest 2 View  10/06/2015  CLINICAL DATA:  Intermittent tachycardia with left-sided chest pain for 1 week. History of hypertension, COPD and diabetes. Initial encounter. EXAM: CHEST  2 VIEW COMPARISON:  Radiographs 04/29/2015 and 03/31/2015.  CT 12/31/2014. FINDINGS: The heart size and mediastinal contours are stable. Heart size is exaggerated by a pectus deformity of the sternum and prominent epicardial fat. Aortic atherosclerosis and a coronary artery stent are noted. The lungs are clear. There is no pleural effusion or pneumothorax. Old rib fractures are noted on the left. IMPRESSION: Stable radiographic appearance of the chest.  No acute findings. Electronically Signed   By: Richardean Sale M.D.   On: 10/06/2015 15:11    EKG: Independently reviewed. No change from previous EKG in June. Sinus rhythm with mild prolonged QT. 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 Chest pain: Unclear etiology. Gives good history. Heart score of 6. Cycle enzymes. We'll ask cardiology to see in the morning, but likely may just recommended medication adjustments for symptomatology. Patient does have history of old CAD  . COPD (chronic obstructive pulmonary disease) ([place]): Stable.  .  Chronic systolic heart failure ([place]): Looks euvolemic. Checking BNP.  Marland Kitchen Senile dementia without behavioral disturbance: Patient is oriented 2. Interactive with good thought process. She does however think that it is 48.  Marland Kitchen UTI (urinary tract infection): Patient symptomatic. She with by mouth Cipro. Stable.  . Obesity (BMI 30-39.9): Patient meets criteria with BMI greater than 30  Consultants:   we'll have cardiology see in the morning  Code Status: DO NOT RESUSCITATE as confirmed by palpation  Family Communication:  left message with niece  Disposition Plan: Anticipate discharge tomorrow   Time spent: 64 minutes  [place] [facility] Pager [phone]

## 2015-10-06 NOTE — ED Notes (Signed)
Patient transported to X-ray 

## 2015-10-06 NOTE — Progress Notes (Signed)
Pt daughter called and was asking for information about her admission. Daughter indicates  has a lot of anxiety and has episodes of confusion which is her baseline. Pt has dementia per daughter. Also pt has been complaining of  of R lower stomach pain .

## 2015-10-06 NOTE — ED Provider Notes (Signed)
CSN: [id number]     Arrival date & time 10/06/15  1413 History   First MD Initiated Contact with Patient 10/06/15 1416     Chief Complaint  Patient presents with  . Palpitations     (Consider location/radiation/quality/duration/timing/severity/associated sxs/prior Treatment) HPI  74 year old female presents with a chief complaint of palpitations. Patient states she's had palpitations "all my life" but today seemed worse than typical. She gets episodes like this about once every month. She had about 2 hours of palpitations with associated shortness of breath and some sharp, pleuritic chest pain. Patient has somewhat limited history due to a history of memory loss. However she seems to be very sure what she is telling me now. 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 She is on blood thinners, tells me she is on Coumadin although this is not on her medicine list. Her heart rate has significantly improved and she no longer feels palpitations. Due to this her chest pain and short of breath are also gone. No abdominal symptoms. Has been having a chronic cough and chronically smokes.  Past Medical History  Diagnosis Date  . CAD (coronary artery disease)     a. s/p inferior STEMI 12/29/10 with rotablator atherectomy RCA 12/31/10 and DES x 2 RCA;  b. Lexiscan Myoview (11/2013):  Low risk study, no ischemia, inferolateral defect likely consistent with attenuation versus scar.  . Ischemic cardiomyopathy     EF 45%cath 2012 EF55% 5/12; 35% 11/14  . Diabetes mellitus     type 2  . HTN (hypertension)   . Hyperlipidemia   . Diverticulosis   . Respiratory arrest//ACE Inhibitor presumed cause   . COPD (chronic obstructive pulmonary disease) ([place])   . Small bowel obstruction (HCC)     from a sigmoid stricture  . Nephrolithiasis   . Sleep apnea   . Overweight(278.02)   . Osteoarthritis   . GERD (gastroesophageal reflux disease)   . Anxiety   . Glaucoma   . Memory  deficit   . Dementia    Past Surgical History  Procedure Laterality Date  . Bilateral tubal ligation    . Lithotripsy    . 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 Cardiac surgery    . Cardiac catheterization N/A 03/18/2015    Procedure: LEFT HEART CATH AND CORONARY ANGIOGRAPHY;  Surgeon: Lorretta Harp, MD;  Location: [facility] CATH LAB;  Service: Cardiovascular;  Laterality: N/A;   Family History  Problem Relation Age of Onset  . Breast cancer Maternal Grandmother   . Diabetes Father   . Heart disease Father   . Diabetes Sister   . Colon cancer Neg Hx    Social History  Substance Use Topics  . Smoking status: Current Every Day Smoker -- 1.00 packs/day for 25 years    Types: Cigarettes  . Smokeless tobacco: Never Used     Comment: over a ppd for 40+ years; quit 10/2004.  unsuccessfully tried eCigs.  . Alcohol Use: No   OB History    No data available     Review of Systems  Constitutional: Negative for fever.  Respiratory: Positive for cough and shortness of breath.   Cardiovascular: Positive for chest pain, palpitations and leg swelling.  Gastrointestinal: Negative for vomiting and abdominal pain.  All other systems reviewed and are negative.     Allergies  Lisinopril; Spironolactone; Donepezil; Eggs or egg-derived products; Erythromycin; Macrolides and ketolides; Penicillins; Quinapril hcl;  Angiotensin receptor blockers; Pentazocine lactate; Propoxyphene hcl; and Sulfonamide derivatives  Home Medications   Prior to Admission medications   Medication Sig Start Date End Date Taking? Authorizing Provider  albuterol (PROVENTIL HFA;VENTOLIN HFA) 108 (90 BASE) MCG/ACT inhaler Inhale 1-2 puffs into the lungs every 6 (six) hours as needed for wheezing.     Historical Provider, MD  albuterol (PROVENTIL) (2.5 MG/3ML) 0.083% nebulizer solution Take 3 mLs (2.5 mg total) by nebulization 4 (four) times daily. 07/22/15   Brand Males, MD  aspirin EC 81  MG tablet Take 81 mg by mouth daily.    Historical Provider, MD  budesonide (PULMICORT) 0.25 MG/2ML nebulizer solution Take 2 mLs (0.25 mg total) by nebulization 2 (two) times daily. 07/22/15   Brand Males, MD  clopidogrel (PLAVIX) 75 MG tablet TAKE 1 TABLET BY MOUTH EVERY DAY 11/11/14   Burnell Blanks, MD  furosemide (LASIX) 20 MG tablet Take 1 tablet (20 mg total) by mouth as needed. 10/28/13   Liliane Shi, PA-C  insulin glargine (LANTUS) 100 UNIT/ML injection Inject 0.1 mLs (10 Units total) into the skin daily. 01/25/14   Hosie Poisson, MD  insulin lispro (HUMALOG) 100 UNIT/ML injection Inject 0-10 Units into the skin 3 (three) times daily before meals. Sliding scale: below 150=0 units, 150-200=2 units, 201-250=4 units, 251-300=6 units, 301-350=8 units, above 350=10 units    Historical Provider, MD  ipratropium (ATROVENT) 0.02 % nebulizer solution Take 2.5 mLs (0.5 mg total) by nebulization 4 (four) times daily. DX CODE J44.9 07/22/15   Brand Males, MD  ipratropium (ATROVENT) 0.02 % nebulizer solution TAKE 2.5 ML BY NEBULIZATION 4 (FOUR) TIMES DAILY. DX CODE J44.9 09/23/15   Brand Males, MD  LORazepam (ATIVAN) 0.5 MG tablet Take 0.5 mg by mouth 2 (two) times daily as needed for anxiety.    Historical Provider, MD  metFORMIN (GLUCOPHAGE) 500 MG tablet Take 2,000 mg by mouth daily before supper. Taking 4 pills at dinner    Historical Provider, MD  metoprolol succinate (TOPROL-XL) 25 MG 24 hr tablet Take 1 tablet (25 mg total) by mouth daily. 10/14/13   Donita Brooks, NP  mometasone-formoterol (DULERA) 100-5 MCG/ACT AERO Inhale 2 puffs into the lungs 2 (two) times daily.    Historical Provider, MD  NAMENDA XR 28 MG CP24 24 hr capsule TAKE ONE CAPSULE BY MOUTH EVERY DAY 02/08/15   Ward Givens, NP  nitroGLYCERIN (NITROSTAT) 0.4 MG SL tablet Place 1 tablet (0.4 mg total) under the tongue every 5 (five) minutes as needed. For chest pain. 06/18/13   Burnell Blanks, MD  PARoxetine  (PAXIL) 10 MG tablet Take 10 mg by mouth daily.    Historical Provider, MD  pravastatin (PRAVACHOL) 40 MG tablet TAKE 1 TABLET (40 MG TOTAL) BY MOUTH DAILY. <PLEASE MAKE APPOINTMENT FOR REFILLS> 12/25/14   Burnell Blanks, MD  pregabalin (LYRICA) 50 MG capsule Take 50 mg by mouth 2 (two) times daily.    Historical Provider, MD   BP 87/76 mmHg  Pulse 104  Temp(Src) 98.1 F (36.7 C) (Oral)  Resp 24  Ht 5\' 1"  (1.549 m)  Wt 159 lb (72.122 kg)  BMI 30.06 kg/m2  SpO2 91% Physical Exam  Constitutional: She is oriented to person, place, and time. She appears well-developed and well-nourished.  HENT:  Head: Normocephalic and atraumatic.  Right Ear: External ear normal.  Left Ear: External ear normal.  Nose: Nose normal.  Eyes: Right eye exhibits no discharge. Left eye exhibits no discharge.  Cardiovascular:  Regular rhythm and normal heart sounds.  Tachycardia present.   Pulmonary/Chest: Effort normal and breath sounds normal.  Abdominal: Soft. There is no tenderness.  Musculoskeletal: She exhibits edema (mild nonpitting edema to LLE).  Neurological: She is alert and oriented to person, place, and time.  Skin: Skin is warm and dry.  Nursing note and vitals reviewed.   ED Course  Procedures (including critical care time) Labs Review Labs Reviewed  COMPREHENSIVE METABOLIC PANEL - Abnormal; Notable for the following:    Glucose, Bld 179 (*)    ALT 9 (*)    GFR calc non Af Amer 59 (*)    All other components within normal limits  CBC WITH DIFFERENTIAL/PLATELET - Abnormal; Notable for the following:    WBC 12.1 (*)    Hemoglobin 15.4 (*)    Neutro Abs 9.2 (*)    All other components within normal limits  TROPONIN I  D-DIMER, QUANTITATIVE (NOT AT [facility])  PROTIME-INR  URINALYSIS, ROUTINE W REFLEX MICROSCOPIC (NOT AT [facility])  I-STAT CG4 LACTIC ACID, ED    Imaging Review Dg Chest 2 View  10/06/2015  CLINICAL DATA:  Intermittent tachycardia with left-sided chest pain for 1 week.  History of hypertension, COPD and diabetes. Initial encounter. EXAM: CHEST  2 VIEW COMPARISON:  Radiographs 04/29/2015 and 03/31/2015.  CT 12/31/2014. FINDINGS: The heart size and mediastinal contours are stable. Heart size is exaggerated by a pectus deformity of the sternum and prominent epicardial fat. Aortic atherosclerosis and a coronary artery stent are noted. The lungs are clear. There is no pleural effusion or pneumothorax. Old rib fractures are noted on the left. IMPRESSION: Stable radiographic appearance of the chest.  No acute findings. Electronically Signed   By: Richardean Sale M.D.   On: 10/06/2015 15:11   I have personally reviewed and evaluated these images and lab results as part of my medical decision-making.   EKG Interpretation   Date/Time:  Wednesday October 06 2015 14:23:08 EST Ventricular Rate:  106 PR Interval:  169 QRS Duration: 79 QT Interval:  384 QTC Calculation: 510 R Axis:   86 Text Interpretation:  Sinus tachycardia Anteroseptal infarct, age  indeterminate Prolonged QT interval no significant change since June 2016  Confirmed by Regenia Skeeter  MD, Cornelius Marullo ([id number]) on 10/06/2015 2:44:46 PM      MDM   Final diagnoses:  Chest pain, unspecified chest pain type    Patient with atypical chest pain associated with increased heart rate and palpitations. Workup for PE is negative as she is lower risk with a negative d-dimer. She is not hypoxic currently. Initially had a blood pressure of 87 but on close recheck his normal again. Chest x-ray unremarkable. Initial troponin negative although her symptoms only started a couple hours ago. She is higher risk with a recent STEMI 4 years ago and a cath 7 months ago that showed a 50% LAD lesion. Pain-free currently. Admit to the hospitalist for overnight observation and ACS rule out.    Sherwood Gambler, MD 10/06/15 [phone]

## 2015-10-06 NOTE — Progress Notes (Addendum)
Dr. [place] Pink paged.  [facility] admit paged : re new admission in room 3w23. Awaiting for orders.

## 2015-10-06 NOTE — ED Notes (Signed)
Pt arrives from home via [facility], C/O heart palpitations. Upon EMS arrival pt HR was 138. Pt HR now 112. Pt denies any nausea, SOB or pain.

## 2015-10-07 ENCOUNTER — Encounter (HOSPITAL_COMMUNITY): Payer: Self-pay | Admitting: Physician Assistant

## 2015-10-07 DIAGNOSIS — N342 Other urethritis: Secondary | ICD-10-CM

## 2015-10-07 DIAGNOSIS — R0789 Other chest pain: Secondary | ICD-10-CM

## 2015-10-07 DIAGNOSIS — F0390 Unspecified dementia without behavioral disturbance: Secondary | ICD-10-CM | POA: Diagnosis not present

## 2015-10-07 DIAGNOSIS — I5022 Chronic systolic (congestive) heart failure: Secondary | ICD-10-CM

## 2015-10-07 DIAGNOSIS — E669 Obesity, unspecified: Secondary | ICD-10-CM | POA: Diagnosis not present

## 2015-10-07 DIAGNOSIS — E119 Type 2 diabetes mellitus without complications: Secondary | ICD-10-CM

## 2015-10-07 DIAGNOSIS — I2510 Atherosclerotic heart disease of native coronary artery without angina pectoris: Secondary | ICD-10-CM

## 2015-10-07 LAB — CBC
HCT: 44.3 % (ref 36.0–46.0)
HCT: 50.3 % — ABNORMAL HIGH (ref 36.0–46.0)
HEMOGLOBIN: 15.1 g/dL — AB (ref 12.0–15.0)
HEMOGLOBIN: 16.5 g/dL — AB (ref 12.0–15.0)
MCH: 33.2 pg (ref 26.0–34.0)
MCH: 32.3 pg (ref 26.0–34.0)
MCHC: 34.1 g/dL (ref 30.0–36.0)
MCHC: 32.8 g/dL (ref 30.0–36.0)
MCV: 97.4 fL (ref 78.0–100.0)
MCV: 98.4 fL (ref 78.0–100.0)
PLATELETS: 170 10*3/uL (ref 150–400)
Platelets: 152 10*3/uL (ref 150–400)
RBC: 4.55 MIL/uL (ref 3.87–5.11)
RBC: 5.11 MIL/uL (ref 3.87–5.11)
RDW: 13.3 % (ref 11.5–15.5)
RDW: 13.1 % (ref 11.5–15.5)
WBC: 9.3 10*3/uL (ref 4.0–10.5)
WBC: 9.6 10*3/uL (ref 4.0–10.5)

## 2015-10-07 LAB — BASIC METABOLIC PANEL
Anion gap: 11 (ref 5–15)
BUN: 17 mg/dL (ref 6–20)
CALCIUM: 9.4 mg/dL (ref 8.9–10.3)
CHLORIDE: 105 mmol/L (ref 101–111)
CO2: 25 mmol/L (ref 22–32)
CREATININE: 0.83 mg/dL (ref 0.44–1.00)
GFR calc non Af Amer: >60 mL/min (ref >60)
GLUCOSE: 138 mg/dL — AB (ref 65–99)
Potassium: 4 mmol/L (ref 3.5–5.1)
Sodium: 141 mmol/L (ref 135–145)

## 2015-10-07 LAB — GLUCOSE, CAPILLARY
GLUCOSE-CAPILLARY: 146 mg/dL — AB (ref 65–99)
Glucose-Capillary: 165 mg/dL — ABNORMAL HIGH (ref 65–99)

## 2015-10-07 LAB — TROPONIN I: TROPONIN I: 0.1 ng/mL — AB (ref <0.031)

## 2015-10-07 LAB — CREATININE, SERUM
CREATININE: 0.9 mg/dL (ref 0.44–1.00)
GFR calc Af Amer: >60 mL/min (ref >60)

## 2015-10-07 LAB — BRAIN NATRIURETIC PEPTIDE: B Natriuretic Peptide: 23.9 pg/mL (ref 0.0–100.0)

## 2015-10-07 MED ORDER — ISOSORBIDE MONONITRATE ER 30 MG PO TB24
15.0000 mg | ORAL_TABLET | Freq: Every day | ORAL | Status: DC
  Administered 2015-10-07: 15 mg via ORAL
  Filled 2015-10-07: qty 1

## 2015-10-07 MED ORDER — ISOSORBIDE MONONITRATE ER 30 MG PO TB24: 15.0000 mg | ORAL_TABLET | Freq: Every day | ORAL | Status: DC

## 2015-10-07 MED ORDER — CEFPODOXIME PROXETIL 200 MG PO TABS: 200.0000 mg | ORAL_TABLET | Freq: Two times a day (BID) | ORAL | Status: DC

## 2015-10-07 NOTE — Progress Notes (Signed)
Trop 0.10. Denies chest pain. Notified Baltazar Najjar NP and will continue to monitor.

## 2015-10-07 NOTE — Discharge Summary (Signed)
[name], is a 74 y.o. female  DOB [birth year]  MRN [id number].  Admission date:  10/06/2015  Admitting Physician  Annita Brod, MD  Discharge Date:  10/07/2015   Primary MD  Vidal Schwalbe, MD  Recommendations for primary care physician for things to follow:   Monitor secondary factors for CAD, close outpatient cardiology follow-up.  Kindly check final urine culture results next visit.   Admission Diagnosis  Chest pain, unspecified chest pain type [R07.9]   Discharge Diagnosis  Chest pain, unspecified chest pain type [R07.9]     Active Problems:   Chronic systolic heart failure (HCC)   COPD (chronic obstructive pulmonary disease) (HCC)   Diabetes mellitus without complication (HCC)   Chest pain   Senile dementia without behavioral disturbance   UTI (urinary tract infection)   Obesity (BMI 30-39.9)      Past Medical History  Diagnosis Date  . CAD (coronary artery disease)     a. s/p inferior STEMI 12/29/10 with rotablator atherectomy RCA 12/31/10 and DES x 2 RCA;  b. Lexiscan Myoview (02/2015): mild reversible apical anterior perfusion defect. C. cath 02/2015 50% LAD lesion with patent stent, Tx Rx    . Ischemic cardiomyopathy     EF 45%cath 2012 EF55% 5/12; 35% 11/14  . Diabetes mellitus     type 2  . HTN (hypertension)   . Hyperlipidemia   . Diverticulosis   . Respiratory arrest//ACE Inhibitor presumed cause   . COPD (chronic obstructive pulmonary disease) ([place])   . Small bowel obstruction (HCC)     from a sigmoid stricture  . Nephrolithiasis   . Sleep apnea   . Overweight(278.02)   . Osteoarthritis   . GERD (gastroesophageal reflux disease)   . Anxiety   . Glaucoma   . Memory deficit   . Dementia     Past Surgical History  Procedure Laterality Date  . Bilateral tubal  ligation    . Lithotripsy    . 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 Cardiac surgery    . Cardiac catheterization N/A 03/18/2015    Procedure: LEFT HEART CATH AND CORONARY ANGIOGRAPHY;  Surgeon: Lorretta Harp, MD;  Location: [facility] CATH LAB;  Service: Cardiovascular;  Laterality: N/A;       HPI  from the history and physical done on the day of admission:   [name] is a 74 y.o. female with past medical history of CAD, diabetes mellitus and mild senile dementia who states that she's been having intermittent episodes of angina which are not new, but in the last month been occurring more frequently. She describes it as a chest pressure sensation over the left side of her chest which occurs whenever she is doing things requiring more exertion. There is some associated shortness of breath. No radiation. They ease off when she rests. Again they seem to be more crying more frequently. She stayed in the last day or 2 it's actually been easier on her. Nevertheless, she came  into the emergency room today for evaluation. Initial EKG and enzymes were unremarkable. She will did note to have a moderate UTI. Hospitalists were called for further evaluation     Hospital Course:      1. Typical chest pain with mild troponin rise in none serious pattern in a patient with underlying CAD, chronic systolic heart failure. EKG nonacute, anti-chest pain-free, seen by cardiologist Dr. Candee Furbish this morning who has added Imdur 15 mg to her regimen and cleared her for discharge without any further workup. Patient is symptom-free and eager to be discharged. Medications adjusted as below. Will be discharged with close outpatient cardiology and PCP follow-up. He is compensated from CHF standpoint.  2. Underlying history of COPD. At baseline. Counseled to quit smoking, no wheezing or acute issues this admission.  3. Mild underlying dementia. At baseline continue home regimen  unchanged.  4. UTI. We will get Vantin upon discharge, request PCP to monitor final urine culture results upon next visit.   Discharge Condition: Stable  Follow UP  Follow-up Information    Follow up with [facility], NP. Go on 10/25/2015.   Specialties:  Cardiology, Radiology   Why:  @ 10:00 am for hospital followup   Contact information:   [place] [address] [phone]       Follow up with Vidal Schwalbe, MD. Schedule an appointment as soon as possible for a visit in 1 week.   Specialty:  Family Medicine   Contact information:   [place] [address] [phone]        Consults obtained - Cards  Diet and Activity recommendation: See Discharge Instructions below  Discharge Instructions       Discharge Instructions    Discharge instructions    Complete by:  As directed   Follow with Primary MD Vidal Schwalbe, MD in 7 days   Get CBC, CMP, 2 view Chest X ray checked  by Primary MD next visit.    Activity: As tolerated with Full fall precautions use walker/cane & assistance as needed   Disposition Home     Diet: Heart Healthy Low Carb.  For Heart failure patients - Check your Weight same time everyday, if you gain over 2 pounds, or you develop in leg swelling, experience more shortness of breath or chest pain, call your Primary MD immediately. Follow Cardiac Low Salt Diet and 1.5 lit/day fluid restriction.   On your next visit with your primary care physician please Get Medicines reviewed and adjusted.   Please request your Prim.MD to go over all Hospital Tests and Procedure/Radiological results at the follow up, please get all Hospital records sent to your Prim MD by signing hospital release before you go home.   If you experience worsening of your admission symptoms, develop shortness of breath, life threatening emergency, suicidal or homicidal thoughts you must seek medical attention immediately  by calling 911 or calling your MD immediately  if symptoms less severe.  You Must read complete instructions/literature along with all the possible adverse reactions/side effects for all the Medicines you take and that have been prescribed to you. Take any new Medicines after you have completely understood and accpet all the possible adverse reactions/side effects.   Do not drive, operating heavy machinery, perform activities at heights, swimming or participation in water activities or provide baby sitting services if your were admitted for syncope or siezures until you have seen by Primary MD or a  Neurologist and advised to do so again.  Do not drive when taking Pain medications.    Do not take more than prescribed Pain, Sleep and Anxiety Medications  Special Instructions: If you have smoked or chewed Tobacco  in the last 2 yrs please stop smoking, stop any regular Alcohol  and or any Recreational drug use.  Wear Seat belts while driving.   Please note  You were cared for by a hospitalist during your hospital stay. If you have any questions about your discharge medications or the care you received while you were in the hospital after you are discharged, you can call the unit and asked to speak with the hospitalist on call if the hospitalist that took care of you is not available. Once you are discharged, your primary care physician will handle any further medical issues. Please note that NO REFILLS for any discharge medications will be authorized once you are discharged, as it is imperative that you return to your primary care physician (or establish a relationship with a primary care physician if you do not have one) for your aftercare needs so that they can reassess your need for medications and monitor your lab values.     Increase activity slowly    Complete by:  As directed              Discharge Medications       Medication List    TAKE these medications        albuterol  (2.5 MG/3ML) 0.083% nebulizer solution  Commonly known as:  PROVENTIL  Take 3 mLs (2.5 mg total) by nebulization 4 (four) times daily.     albuterol 108 (90 BASE) MCG/ACT inhaler  Commonly known as:  PROVENTIL HFA;VENTOLIN HFA  Inhale 1-2 puffs into the lungs every 6 (six) hours as needed for wheezing.     aspirin EC 81 MG tablet  Take 81 mg by mouth daily.     budesonide 0.25 MG/2ML nebulizer solution  Commonly known as:  PULMICORT  Take 2 mLs (0.25 mg total) by nebulization 2 (two) times daily.     clopidogrel 75 MG tablet  Commonly known as:  PLAVIX  TAKE 1 TABLET BY MOUTH EVERY DAY     furosemide 20 MG tablet  Commonly known as:  LASIX  Take 1 tablet (20 mg total) by mouth as needed.     insulin glargine 100 UNIT/ML injection  Commonly known as:  LANTUS  Inject 0.1 mLs (10 Units total) into the skin daily.     insulin lispro 100 UNIT/ML injection  Commonly known as:  HUMALOG  Inject 0-10 Units into the skin 3 (three) times daily before meals. Sliding scale: below 150=0 units, 150-200=2 units, 201-250=4 units, 251-300=6 units, 301-350=8 units, above 350=10 units     ipratropium 0.02 % nebulizer solution  Commonly known as:  ATROVENT  Take 2.5 mLs (0.5 mg total) by nebulization 4 (four) times daily. DX CODE J44.9     isosorbide mononitrate 30 MG 24 hr tablet  Commonly known as:  IMDUR  Take 0.5 tablets (15 mg total) by mouth daily.     LORazepam 0.5 MG tablet  Commonly known as:  ATIVAN  Take 0.5 mg by mouth 2 (two) times daily as needed for anxiety.     metFORMIN 500 MG tablet  Commonly known as:  GLUCOPHAGE  Take 2,000 mg by mouth daily before supper.     metoprolol succinate 25 MG 24 hr tablet  Commonly known as:  TOPROL-XL  Take 1 tablet (25 mg total) by mouth daily.     mometasone-formoterol 100-5 MCG/ACT Aero  Commonly known as:  DULERA  Inhale 2 puffs into the lungs 2 (two) times daily.     NAMENDA XR 28 MG Cp24 24 hr capsule  Generic drug:  memantine    TAKE ONE CAPSULE BY MOUTH EVERY DAY     nitroGLYCERIN 0.4 MG SL tablet  Commonly known as:  NITROSTAT  Place 1 tablet (0.4 mg total) under the tongue every 5 (five) minutes as needed. For chest pain.     PARoxetine 10 MG tablet  Commonly known as:  PAXIL  Take 10 mg by mouth daily.     pravastatin 40 MG tablet  Commonly known as:  PRAVACHOL  TAKE 1 TABLET (40 MG TOTAL) BY MOUTH DAILY. <PLEASE MAKE APPOINTMENT FOR REFILLS>     pregabalin 50 MG capsule  Commonly known as:  LYRICA  Take 50 mg by mouth 2 (two) times daily.        Major procedures and Radiology Reports - PLEASE review detailed and final reports for all details, in brief -    Dg Chest 2 View  10/06/2015  CLINICAL DATA:  Intermittent tachycardia with left-sided chest pain for 1 week. History of hypertension, COPD and diabetes. Initial encounter. EXAM: CHEST  2 VIEW COMPARISON:  Radiographs 04/29/2015 and 03/31/2015.  CT 12/31/2014. FINDINGS: The heart size and mediastinal contours are stable. Heart size is exaggerated by a pectus deformity of the sternum and prominent epicardial fat. Aortic atherosclerosis and a coronary artery stent are noted. The lungs are clear. There is no pleural effusion or pneumothorax. Old rib fractures are noted on the left. IMPRESSION: Stable radiographic appearance of the chest.  No acute findings. Electronically Signed   By: Richardean Sale M.D.   On: 10/06/2015 15:11    Micro Results      No results found for this or any previous visit (from the past 240 hour(s)).  Today   Subjective    Bracha Goggins today has no headache,no chest abdominal pain,no new weakness tingling or numbness, feels much better wants to go home today.     Objective   Blood pressure 138/57, pulse 87, temperature 97.4 F (36.3 C), temperature source Oral, resp. rate 18, height 5\' 1"  (1.549 m), weight 67.903 kg (149 lb 11.2 oz), SpO2 97 %.   Intake/Output Summary (Last 24 hours) at 10/07/15 0901 Last data  filed at 10/07/15 0700  Gross per 24 hour  Intake    460 ml  Output    375 ml  Net     85 ml    Exam Awake Alert, Oriented x 3, No new F.N deficits, Normal affect [place].AT,PERRAL Supple Neck,No JVD, No cervical lymphadenopathy appriciated.  Symmetrical Chest wall movement, Good air movement bilaterally, CTAB RRR,No Gallops,Rubs or new Murmurs, No Parasternal Heave +ve B.Sounds, Abd Soft, Non tender, No organomegaly appriciated, No rebound -guarding or rigidity. No Cyanosis, Clubbing or edema, No new Rash or bruise   Data Review   CBC w Diff: Lab Results  Component Value Date   WBC 9.6 10/07/2015   HGB 16.5* 10/07/2015   HCT 50.3* 10/07/2015   PLT 170 10/07/2015   LYMPHOPCT 17 10/06/2015   MONOPCT 6 10/06/2015   EOSPCT 1 10/06/2015   BASOPCT 0 10/06/2015    CMP: Lab Results  Component Value Date   NA 141 10/07/2015   K 4.0 10/07/2015   CL 105 10/07/2015  CO2 25 10/07/2015   BUN 17 10/07/2015   CREATININE 0.83 10/07/2015   PROT 6.9 10/06/2015   ALBUMIN 3.7 10/06/2015   BILITOT 0.6 10/06/2015   ALKPHOS 62 10/06/2015   AST 15 10/06/2015   ALT 9* 10/06/2015  .   Total Time in preparing paper work, data evaluation and todays exam - 35 minutes  Thurnell Lose M.D on 10/07/2015 at 9:01 AM  [facility]   Office  [phone]

## 2015-10-07 NOTE — Consult Note (Signed)
CARDIOLOGY CONSULT NOTE   Patient ID: [name] MRN: [id number] DOB/AGE: [birth year] 74 y.o.  Admit date: 10/06/2015  Primary Physician   Vidal Schwalbe, MD Primary Cardiologist   Dr. Angelena Form Reason for Consultation   Chest pain  HPI: [name] is a 74 y.o. female with a history of  DM2, HTN, HL, chronic systolic heart failure, tobacco abuse, mild senile dementia and COPD who was admitted for evaluation of ongoing chest pain.   She was admitted February 2012 with an acute inferior STEMI. Emergent cath 12/28/10 with serial lesions in subtotally occluded RCA. We were unable to stent the vessel during the index procedure because of severe calcification in the mid vessel. Flow was established with balloon inflations and she was treated with Integrilin, Effient and ASA. She had staged PCI 12/31/10 at which time a rotablator atherectomy of the mid RCA was performed. She did well with both procedures. Two overlapping drug eluting stents were placed in the mid RCA.  The patient was admitted 03/16/15-03/20/15 for chest pain. Myoview 4/27 showed EF 47% with small, mild reversible apical anterior perfusion defect that may be indicative of a small area of ischemia. Pt underwent heart cath 4/28 with findings of 50% lesion of LAD, stents patent with recommendations for continued medical management. Last echo 4/27 showed EF of at least 45%, mild LVH, mild to moderate MR, mild dilated LA.   The patient is somewhat limited to history due to mild dementia. She states that intermittently for "many months "she has been having left-sided sharp/achy chest pain. Mostly associated with exertion along with shortness of breath. She stated that last night she woke up with a chest pressure and afterward she had a vomiting. However reviewing ED provider note noted that she did not complaining any GI symptoms. She denies dictations however she did mention palpitation to ED provider. He denies orthopnea, lower  extremity edema, syncope, PND, fever, chills, headache, cough, nausea, melena or blood in her stool.  EKG is unremarkable. BNP 23.9. Troponin trend 0.03->0.1->0.03. Lytes normal. UA showed mild UTI. D-dimer negative. Chest x-ray upon regarding no acute finding, however reviewing chest x-ray revealed that she had a mild fluid on left-sided costovertebral angle.   Home meds listed as Lasix 20 mg when necessary, however patient states that she is taking Lasix 20 mg daily for the past 2 years.  Past Medical History  Diagnosis Date  . CAD (coronary artery disease)     a. s/p inferior STEMI 12/29/10 with rotablator atherectomy RCA 12/31/10 and DES x 2 RCA;  b. Lexiscan Myoview (02/2015): mild reversible apical anterior perfusion defect. C. cath 02/2015 50% LAD lesion with patent stent, Tx Rx    . Ischemic cardiomyopathy     EF 45%cath 2012 EF55% 5/12; 35% 11/14  . Diabetes mellitus     type 2  . HTN (hypertension)   . Hyperlipidemia   . Diverticulosis   . Respiratory arrest//ACE Inhibitor presumed cause   . COPD (chronic obstructive pulmonary disease) ([place])   . Small bowel obstruction (HCC)     from a sigmoid stricture  . Nephrolithiasis   . Sleep apnea   . Overweight(278.02)   . Osteoarthritis   . GERD (gastroesophageal reflux disease)   . Anxiety   . Glaucoma   . Memory deficit   . Dementia      Past Surgical History  Procedure Laterality Date  . Bilateral tubal ligation    . Lithotripsy    . Lap sigmoid colectomy  with repair of colovesical fistula  07/31/2008  . Cardiac surgery    . Cardiac catheterization N/A 03/18/2015    Procedure: LEFT HEART CATH AND CORONARY ANGIOGRAPHY;  Surgeon: Lorretta Harp, MD;  Location: [facility] CATH LAB;  Service: Cardiovascular;  Laterality: N/A;    Allergies  Allergen Reactions  . Lisinopril Other (See Comments)    REACTION: respiratory arrest jan 2009  . Spironolactone Other (See Comments)    PATIENT HAD SEVERE SIDE EFFECTS  . Donepezil Diarrhea    . Eggs Or Egg-Derived Products Diarrhea  . Erythromycin Other (See Comments)    Makes me feel weird   . Macrolides And Ketolides Other (See Comments)  . Penicillins Itching and Other (See Comments)    Too much as a kid  . Quinapril Hcl Itching and Other (See Comments)    Too much AS A KID  . Angiotensin Receptor Blockers Other (See Comments)    unknown  . Pentazocine Lactate Other (See Comments)    Reaction unknown  . Propoxyphene Hcl Other (See Comments)    Reaction unknown  . Sulfonamide Derivatives Other (See Comments)    Childhood allergy    I have reviewed the patient's current medications . albuterol  2.5 mg Nebulization QID  . aspirin EC  81 mg Oral Daily  . ciprofloxacin  500 mg Oral BID  . clopidogrel  75 mg Oral Daily  . enoxaparin (LOVENOX) injection  40 mg Subcutaneous Q24H  . insulin aspart  0-5 Units Subcutaneous QHS  . insulin aspart  0-9 Units Subcutaneous TID WC  . insulin glargine  10 Units Subcutaneous Daily  . memantine  28 mg Oral Daily  . metFORMIN  2,000 mg Oral QAC supper  . metoprolol succinate  25 mg Oral Daily  . mometasone-formoterol  2 puff Inhalation BID  . PARoxetine  10 mg Oral Daily  . pravastatin  40 mg Oral q1800  . pregabalin  50 mg Oral BID     acetaminophen, albuterol, LORazepam, nitroGLYCERIN, ondansetron (ZOFRAN) IV  Prior to Admission medications   Medication Sig Start Date End Date Taking? Authorizing Provider  albuterol (PROVENTIL) (2.5 MG/3ML) 0.083% nebulizer solution Take 3 mLs (2.5 mg total) by nebulization 4 (four) times daily. 07/22/15  Yes Brand Males, MD  aspirin EC 81 MG tablet Take 81 mg by mouth daily.   Yes Historical Provider, MD  budesonide (PULMICORT) 0.25 MG/2ML nebulizer solution Take 2 mLs (0.25 mg total) by nebulization 2 (two) times daily. 07/22/15  Yes Brand Males, MD  clopidogrel (PLAVIX) 75 MG tablet TAKE 1 TABLET BY MOUTH EVERY DAY 11/11/14  Yes Burnell Blanks, MD  furosemide (LASIX) 20 MG  tablet Take 1 tablet (20 mg total) by mouth as needed. Patient taking differently: Take 20 mg by mouth as needed for fluid.  10/28/13  Yes Scott T Kathlen Mody, PA-C  insulin glargine (LANTUS) 100 UNIT/ML injection Inject 0.1 mLs (10 Units total) into the skin daily. 01/25/14  Yes Hosie Poisson, MD  insulin lispro (HUMALOG) 100 UNIT/ML injection Inject 0-10 Units into the skin 3 (three) times daily before meals. Sliding scale: below 150=0 units, 150-200=2 units, 201-250=4 units, 251-300=6 units, 301-350=8 units, above 350=10 units   Yes Historical Provider, MD  metFORMIN (GLUCOPHAGE) 500 MG tablet Take 2,000 mg by mouth daily before supper.    Yes Historical Provider, MD  metoprolol succinate (TOPROL-XL) 25 MG 24 hr tablet Take 1 tablet (25 mg total) by mouth daily. 10/14/13  Yes Donita Brooks, NP  mometasone-formoterol (  DULERA) 100-5 MCG/ACT AERO Inhale 2 puffs into the lungs 2 (two) times daily.   Yes Historical Provider, MD  NAMENDA XR 28 MG CP24 24 hr capsule TAKE ONE CAPSULE BY MOUTH EVERY DAY 02/08/15  Yes Ward Givens, NP  PARoxetine (PAXIL) 10 MG tablet Take 10 mg by mouth daily.   Yes Historical Provider, MD  pravastatin (PRAVACHOL) 40 MG tablet TAKE 1 TABLET (40 MG TOTAL) BY MOUTH DAILY. <PLEASE MAKE APPOINTMENT FOR REFILLS> 12/25/14  Yes Burnell Blanks, MD  pregabalin (LYRICA) 50 MG capsule Take 50 mg by mouth 2 (two) times daily.   Yes Historical Provider, MD  albuterol (PROVENTIL HFA;VENTOLIN HFA) 108 (90 BASE) MCG/ACT inhaler Inhale 1-2 puffs into the lungs every 6 (six) hours as needed for wheezing.     Historical Provider, MD  ipratropium (ATROVENT) 0.02 % nebulizer solution Take 2.5 mLs (0.5 mg total) by nebulization 4 (four) times daily. DX CODE J44.9 Patient taking differently: Take 0.5 mg by nebulization every 6 (six) hours as needed for wheezing.  07/22/15   Brand Males, MD  LORazepam (ATIVAN) 0.5 MG tablet Take 0.5 mg by mouth 2 (two) times daily as needed for anxiety.     Historical Provider, MD  nitroGLYCERIN (NITROSTAT) 0.4 MG SL tablet Place 1 tablet (0.4 mg total) under the tongue every 5 (five) minutes as needed. For chest pain. 06/18/13   Burnell Blanks, MD     Social History   Social History  . Marital Status: Widowed    Spouse Name: N/A  . Number of Children: 2  . Years of Education: 11   Occupational History  . Retired     Haematologist    Social History Main Topics  . Smoking status: Current Every Day Smoker -- 1.00 packs/day for 25 years    Types: Cigarettes  . Smokeless tobacco: Never Used     Comment: over a ppd for 40+ years; quit 10/2004.  unsuccessfully tried eCigs.  . Alcohol Use: No  . Drug Use: No  . Sexual Activity: No   Other Topics Concern  . Not on file   Social History Narrative   Patient is widowed with 2 children.   Patient is right handed.   Patient has 11 th grade education.   Patient drinks 6-7 daily.    Family Status  Relation Status Death Age  . Father Deceased     late 40's  . Mother Deceased 48   Family History  Problem Relation Age of Onset  . Breast cancer Maternal Grandmother   . Diabetes Father   . Heart disease Father   . Diabetes Sister   . Colon cancer Neg Hx       ROS:  Full 14 point review of systems complete and found to be negative unless listed above.  Physical Exam: Blood pressure 138/57, pulse 87, temperature 97.4 F (36.3 C), temperature source Oral, resp. rate 18, height 5\' 1"  (1.549 m), weight 149 lb 11.2 oz (67.903 kg), SpO2 97 %.  General: Well developed, well nourished, mildly demented  female in no acute distress Head: Eyes PERRLA, No xanthomas. Normocephalic and atraumatic, oropharynx without edema or exudate.  Lungs: Resp regular and unlabored. Bibasilar rates.  Heart: RRR no s3, s4, or murmurs..   Neck: No carotid bruits. No lymphadenopathy.  No JVD. Abdomen: Bowel sounds present, abdomen soft and non-tender without masses or hernias noted. Msk:   No spine or cva tenderness. No weakness, no joint deformities or effusions.  Extremities: No clubbing, cyanosis or edema. DP/PT/Radials 2+ and equal bilaterally. Neuro: Alert and oriented X 3. No focal deficits noted. Psych:  Seems somewhat confused Skin: No rashes or lesions noted.  Labs:   Lab Results  Component Value Date   WBC 9.6 10/07/2015   HGB 16.5* 10/07/2015   HCT 50.3* 10/07/2015   MCV 98.4 10/07/2015   PLT 170 10/07/2015    Recent Labs  10/06/15 1530  INR 1.08    Recent Labs Lab 10/06/15 1530  10/07/15 0337  NA 142  --  141  K 3.5  --  4.0  CL 106  --  105  CO2 25  --  25  BUN 19  --  17  CREATININE 0.94  < > 0.83  CALCIUM 9.2  --  9.4  PROT 6.9  --   --   BILITOT 0.6  --   --   ALKPHOS 62  --   --   ALT 9*  --   --   AST 15  --   --   GLUCOSE 179*  --  138*  ALBUMIN 3.7  --   --   < > = values in this interval not displayed. MAGNESIUM  Date Value Ref Range Status  04/29/2015 1.6* 1.7 - 2.4 mg/dL Final    Recent Labs  10/06/15 1530 10/06/15 2304 10/07/15 0020 10/07/15 0337  TROPONINI <0.03 <0.03 0.10* <0.03   No results for input(s): TROPIPOC in the last 72 hours. PRO B NATRIURETIC PEPTIDE (BNP)  Date/Time Value Ref Range Status  01/23/2014 02:24 PM 279.5* 0 - 125 pg/mL Final  10/28/2013 12:48 PM 41.0 0.0 - 100.0 pg/mL Final   Lab Results  Component Value Date   CHOL 69 12/19/2012   HDL 31.30* 12/19/2012   LDLCALC 10 12/19/2012   TRIG 138.0 12/19/2012   Lab Results  Component Value Date   DDIMER 0.30 10/06/2015    Echo: 03/17/15 Indications:   Chest pain 786.51.  ------------------------------------------------------------------- History:  PMH: Cardiomyopathy. Congestive heart failure. Chronic obstructive pulmonary disease. Risk factors: Current tobacco use. Hypertension. Diabetes mellitus. Dyslipidemia.  ------------------------------------------------------------------- Study Conclusions  - Left ventricle:  Technically very limited study. Contrast was used. It helped with the assessment of LV function. I feel that we can say that the EF is at least 45%. The cavity size was normal. Wall thickness was increased in a pattern of mild LVH. Images were inadequate for LV wall motion assessment. - Mitral valve: There was mild to moderate regurgitation. - Left atrium: The atrium was mildly dilated. - Right ventricle: The cavity size was normal. Systolic function was normal.  Cath 02/2015 1. Left main; normal  2. LAD; 50% proximal hypodense lesion at the first septal perforator which did not appear to be hemogram is significant 3. Left circumflex; free of significant disease.  4. Right coronary artery; dominant with widely patent stents and no evidence of luminal narrowing 5. Left ventriculography; RAO left ventriculogram was performed using  25 mL of Visipaque dye at 12 mL/second. The overall LVEF estimated  45-50 % With wall motion abnormalities mild to moderate inferior hypokinesia  IMPRESSION:Ms. Mounts has noncritical CAD with at most a moderate proximal LAD lesion in the 50% range and did not appear to be hemogram and a significant. Her stented RCA is widely patent she has mild LV dysfunction with an inferior wall motion under Melanie probably residual from her prior inferior STEMI 12 continue medical therapy will be recommended. A femoral intramedullary performed and  a MYNX closure device was used to obtain hemostasis. The patient left the lab in stable condition.  ECG:   Vent. rate 106 BPM PR interval 169 ms QRS duration 79 ms QT/QTc 384/510 ms P-R-T axes 104 86 41  Radiology:  Dg Chest 2 View  10/06/2015  CLINICAL DATA:  Intermittent tachycardia with left-sided chest pain for 1 week. History of hypertension, COPD and diabetes. Initial encounter. EXAM: CHEST  2 VIEW COMPARISON:  Radiographs 04/29/2015 and 03/31/2015.  CT 12/31/2014. FINDINGS: The heart size and mediastinal  contours are stable. Heart size is exaggerated by a pectus deformity of the sternum and prominent epicardial fat. Aortic atherosclerosis and a coronary artery stent are noted. The lungs are clear. There is no pleural effusion or pneumothorax. Old rib fractures are noted on the left. IMPRESSION: Stable radiographic appearance of the chest.  No acute findings. Electronically Signed   By: Richardean Sale M.D.   On: 10/06/2015 15:11    ASSESSMENT AND PLAN:     1. Chest pain - Her chest pain has both typical and atypical features. She was unable to provide detailed cardiac history due to underlying dementia. - EKG is unremarkable. BNP 23.9. Troponin trend 0.03->0.1->0.03. Lytes normal. D-dimer negative. Chest x-ray upon regarding no acute finding, however reviewing chest x-ray revealed that she had a mild fluid on left-sided costovertebral angle.  - She has eaten today. - Continue aspirin, Plavix, Toprol-XL 25 MG and pravastatin. - Will continue dual antiplatelet therapy for now due to unclear history. Consider discontinuation of plavix as an outpatient. Continue beta blocker and statin. Will add low-dose Imdur for possible anginal pain. She will follow up in a clinic for further management. Consider outpatient Myoview or further evaluation if symptomatic. She can be discharged on cardiac stand point.  2. Chronic systolic heart failure (HCC) - Appears euvolemic except faint bibasilar rales. Home meds listed as Lasix 20 mg when necessary, however patient states that she is taking Lasix 20 mg daily for the past 2 years. - Continue Lasix 20 mg daily..    3. CAD - s/p inferior STEMI 12/29/10 with rotablator atherectomy RCA 12/31/10 and DES x 2 RCA - Lexiscan Myoview (02/2015) showed mild reversible apical anterior perfusion defect. F/u cath 02/2015 showed  50% LAD lesion with patent stent. Plan for medical management.   Otherwise per primary: COPD (chronic obstructive pulmonary disease) (HCC) Diabetes  mellitus without complication ([place]) Senile dementia without behavioral disturbance UTI (urinary tract infection) Obesity (BMI 30-39.9)   SignedLeanor Kail, PA 10/07/2015, 8:05 AM Pager [phone]  Co-Sign MD  Personally seen and examined. Agree with above.  74 year old female with prior inferior ST elevation myocardial infarction in 2012 with RCA stent placement 2 with history of chronic chest discomfort last cardiac catheterization in April 2016, 50% LAD, medical management. Here with chest discomfort. Difficult to categorize. History is challenging. Nonetheless, she feels comfortable this morning. Troponin is normal at last check. She had one troponin that was 0.1, subsequent troponin is normal. Not ACS.  -No further cardiac testing recommended -We will start isosorbide 15 mg once a day -Continue for now on dual antiplatelet therapy. No signs of bleeding. -She states that she has been taking Lasix 20 mg once a day for several years.  Comfortable with discharge home.  Candee Furbish, MD

## 2015-10-07 NOTE — Progress Notes (Signed)
Patient moved to room 3W26 (camera room) for ultimate safety monitoring.

## 2015-10-07 NOTE — Discharge Instructions (Signed)
Follow with Primary MD Vidal Schwalbe, MD in 7 days   Get CBC, CMP, 2 view Chest X ray checked  by Primary MD next visit.    Activity: As tolerated with Full fall precautions use walker/cane & assistance as needed   Disposition Home     Diet: Heart Healthy Low Carb.  For Heart failure patients - Check your Weight same time everyday, if you gain over 2 pounds, or you develop in leg swelling, experience more shortness of breath or chest pain, call your Primary MD immediately. Follow Cardiac Low Salt Diet and 1.5 lit/day fluid restriction.   On your next visit with your primary care physician please Get Medicines reviewed and adjusted.   Please request your Prim.MD to go over all Hospital Tests and Procedure/Radiological results at the follow up, please get all Hospital records sent to your Prim MD by signing hospital release before you go home.   If you experience worsening of your admission symptoms, develop shortness of breath, life threatening emergency, suicidal or homicidal thoughts you must seek medical attention immediately by calling 911 or calling your MD immediately  if symptoms less severe.  You Must read complete instructions/literature along with all the possible adverse reactions/side effects for all the Medicines you take and that have been prescribed to you. Take any new Medicines after you have completely understood and accpet all the possible adverse reactions/side effects.   Do not drive, operating heavy machinery, perform activities at heights, swimming or participation in water activities or provide baby sitting services if your were admitted for syncope or siezures until you have seen by Primary MD or a Neurologist and advised to do so again.  Do not drive when taking Pain medications.    Do not take more than prescribed Pain, Sleep and Anxiety Medications  Special Instructions: If you have smoked or chewed Tobacco  in the last 2 yrs please stop smoking, stop any  regular Alcohol  and or any Recreational drug use.  Wear Seat belts while driving.   Please note  You were cared for by a hospitalist during your hospital stay. If you have any questions about your discharge medications or the care you received while you were in the hospital after you are discharged, you can call the unit and asked to speak with the hospitalist on call if the hospitalist that took care of you is not available. Once you are discharged, your primary care physician will handle any further medical issues. Please note that NO REFILLS for any discharge medications will be authorized once you are discharged, as it is imperative that you return to your primary care physician (or establish a relationship with a primary care physician if you do not have one) for your aftercare needs so that they can reassess your need for medications and monitor your lab values.

## 2015-10-07 NOTE — Progress Notes (Signed)
Bedside RN asked CM to speak with pt's daughter per daughter request- pt's daughter wanted info on adult daycare and ALF options- CSW aware and is suppose to f/u with pt's daughter- explained to daughter [name] that currently pt did not qualify for SNF placement- daughter states that PCP has tried to place pt and that they are aware that pt is too high functioning for insurance to pay and does not have a qualifying hospital stay. Daughter states that they can not afford out of pocket SNF cost and that they have family that stays with pt but there is a block of time that she is alone and that sometimes she "panicks" and calls 911- offered private duty list - she states that they can not afford that- discussed applying for Medicaid but daughter feels with pt's income she may not qualify- encouraged daughter to go to DSS and talk with someone regarding Medicaid as this would be what would cover long term placement and ALF. Also discussed [facility] program and gave daughter information on [facility]- she stated that pt's PCP had mentioned this program to them as a possible option also.

## 2015-10-24 NOTE — Progress Notes (Signed)
Cardiology Office Note   Date:  10/25/2015   ID:  [name], DOB 10/21/1941, MRN [id number]  PCP:  Vidal Schwalbe, MD  Cardiologist:  Dr. Angelena Form    Chief Complaint  Patient presents with  . Hospitalization Follow-up      History of Present Illness: [name] is a 74 y.o. female who presents for post hospital visit.  Hospitalized 09/2015 for chest pain.    She has a history of DM2, HTN, HL, chronic systolic heart failure, tobacco abuse, mild senile dementia and COPD  2012 with an acute inferior STEMI. Emergent cath 12/28/10 with serial lesions in subtotally occluded RCA. We were unable to stent the vessel during the index procedure because of severe calcification in the mid vessel. Flow was established with balloon inflations and she was treated with Integrilin, Effient and ASA. She had staged PCI 12/31/10 at which time a rotablator atherectomy of the mid RCA was performed. She did well with both procedures. Two overlapping drug eluting stents were placed in the mid RCA.  The patient was admitted 03/16/15-03/20/15 for chest pain. Myoview 4/27 showed EF 47% with small, mild reversible apical anterior perfusion defect that may be indicative of a small area of ischemia. Pt underwent heart cath 4/28 with findings of 50% lesion of LAD, stents patent with recommendations for continued medical management. Last echo 4/27 showed EF of at least 45%, mild LVH, mild to moderate MR, mild dilated LA.  Admitted with chest pain  - enzymes neg except one of 0.1 possible spurious. No further cardiac work up recommended  Imdur 15 mg added.  Today no memory of her chest pain, cannot tell if imdur improved her pain.  Her daughter is with her at night she forgets someone is with her and thinks she is alone and then has panic attack.  On hopsitalization she got out of house and was walking down the street in her nightgown.  EMS was called, no with alarms on the doors. She feels well today only  complains of knee pain. And that is intermittent.   Past Medical History  Diagnosis Date  . CAD (coronary artery disease)     a. s/p inferior STEMI 12/29/10 with rotablator atherectomy RCA 12/31/10 and DES x 2 RCA;  b. Lexiscan Myoview (02/2015): mild reversible apical anterior perfusion defect. C. cath 02/2015 50% LAD lesion with patent stent, Tx Rx    . Ischemic cardiomyopathy     EF 45%cath 2012 EF55% 5/12; 35% 11/14  . Diabetes mellitus     type 2  . HTN (hypertension)   . Hyperlipidemia   . Diverticulosis   . Respiratory arrest//ACE Inhibitor presumed cause   . COPD (chronic obstructive pulmonary disease) ([place])   . Small bowel obstruction (HCC)     from a sigmoid stricture  . Nephrolithiasis   . Sleep apnea   . Overweight(278.02)   . Osteoarthritis   . GERD (gastroesophageal reflux disease)   . Anxiety   . Glaucoma   . Memory deficit   . Dementia     Past Surgical History  Procedure Laterality Date  . Bilateral tubal ligation    . Lithotripsy    . 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 Cardiac surgery    . Cardiac catheterization N/A 03/18/2015    Procedure: LEFT HEART CATH AND CORONARY ANGIOGRAPHY;  Surgeon: Lorretta Harp, MD;  Location: [facility] CATH LAB;  Service: Cardiovascular;  Laterality: N/A;  Current Outpatient Prescriptions  Medication Sig Dispense Refill  . albuterol (PROVENTIL HFA;VENTOLIN HFA) 108 (90 BASE) MCG/ACT inhaler Inhale 1-2 puffs into the lungs every 6 (six) hours as needed for wheezing.     Marland Kitchen albuterol (PROVENTIL) (2.5 MG/3ML) 0.083% nebulizer solution Take 3 mLs (2.5 mg total) by nebulization 4 (four) times daily. 360 mL 11  . aspirin EC 81 MG tablet Take 81 mg by mouth daily.    . budesonide (PULMICORT) 0.25 MG/2ML nebulizer solution Take 2 mLs (0.25 mg total) by nebulization 2 (two) times daily. 120 mL 12  . cefpodoxime (VANTIN) 200 MG tablet Take 1 tablet (200 mg total) by mouth 2 (two) times daily. 12 tablet 0    . clopidogrel (PLAVIX) 75 MG tablet TAKE 1 TABLET BY MOUTH EVERY DAY 30 tablet 0  . furosemide (LASIX) 20 MG tablet Take 20 mg by mouth daily. EDEMA    . insulin glargine (LANTUS) 100 UNIT/ML injection Inject 0.1 mLs (10 Units total) into the skin daily. 10 mL 1  . insulin lispro (HUMALOG) 100 UNIT/ML injection Inject 0-10 Units into the skin 3 (three) times daily before meals. Sliding scale: below 150=0 units, 150-200=2 units, 201-250=4 units, 251-300=6 units, 301-350=8 units, above 350=10 units    . ipratropium (ATROVENT) 0.02 % nebulizer solution Take 2.5 mLs (0.5 mg total) by nebulization 4 (four) times daily. DX CODE J44.9 (Patient taking differently: Take 0.5 mg by nebulization every 6 (six) hours as needed for wheezing. ) 360 mL 0  . isosorbide mononitrate (IMDUR) 30 MG 24 hr tablet Take 0.5 tablets (15 mg total) by mouth daily. 30 tablet 0  . LORazepam (ATIVAN) 0.5 MG tablet Take 0.5 mg by mouth 2 (two) times daily as needed for anxiety.    . metFORMIN (GLUCOPHAGE) 500 MG tablet Take 2,000 mg by mouth daily before supper.     . metoprolol succinate (TOPROL-XL) 25 MG 24 hr tablet Take 1 tablet (25 mg total) by mouth daily. 30 tablet 3  . mometasone-formoterol (DULERA) 100-5 MCG/ACT AERO Inhale 2 puffs into the lungs 2 (two) times daily.    Marland Kitchen NAMENDA XR 28 MG CP24 24 hr capsule TAKE ONE CAPSULE BY MOUTH EVERY DAY 30 capsule 5  . nitroGLYCERIN (NITROSTAT) 0.4 MG SL tablet Place 1 tablet (0.4 mg total) under the tongue every 5 (five) minutes as needed. For chest pain. 25 tablet 6  . omeprazole (PRILOSEC) 20 MG capsule Take 20 mg by mouth as needed (REFLUX).    Marland Kitchen oxyCODONE-acetaminophen (PERCOCET/ROXICET) 5-325 MG tablet Take 1 tablet by mouth every 4 (four) hours as needed for moderate pain or severe pain.    Marland Kitchen PARoxetine (PAXIL) 10 MG tablet Take 10 mg by mouth daily.    . pravastatin (PRAVACHOL) 40 MG tablet TAKE 1 TABLET (40 MG TOTAL) BY MOUTH DAILY. <PLEASE MAKE APPOINTMENT FOR REFILLS> 15  tablet 0   No current facility-administered medications for this visit.    Allergies:   Lisinopril; Spironolactone; Donepezil; Eggs or egg-derived products; Erythromycin; Macrolides and ketolides; Penicillins; Quinapril hcl; Angiotensin receptor blockers; Lipitor; Pentazocine lactate; Propoxyphene hcl; Sulfonamide derivatives; and Vancomycin    Social History:  The patient  reports that she has been smoking Cigarettes.  She has a 25 pack-year smoking history. She has never used smokeless tobacco. She reports that she does not drink alcohol or use illicit drugs.   Family History:  The patient's family history includes Breast cancer in her maternal grandmother; Diabetes in her father and sister; Heart attack in  her father; Heart disease in her father; Hypertension in her sister; Stroke in her mother. There is no history of Colon cancer.    ROS:  General:no colds or fevers, no weight changes Skin:no rashes or ulcers HEENT:no blurred vision, no congestion CV:see HPI PUL:see HPI GI:no diarrhea constipation or melena, no indigestion GU:no hematuria, no dysuria MS:no joint pain, no claudication Neuro:no syncope, no lightheadedness Endo:+ diabetes, no thyroid disease  Wt Readings from Last 3 Encounters:  10/25/15 157 lb (71.215 kg)  10/07/15 149 lb 11.2 oz (67.903 kg)  07/22/15 159 lb (72.122 kg)     PHYSICAL EXAM: VS:  BP 140/82 mmHg  Pulse 77  Ht 5\' 1"  (1.549 m)  Wt 157 lb (71.215 kg)  BMI 29.68 kg/m2 , BMI Body mass index is 29.68 kg/(m^2). General:Pleasant affect, NAD, does not remember getting out of the house, or any pain Skin:Warm and dry, brisk capillary refill HEENT:normocephalic, sclera clear, mucus membranes moist Neck:supple, no JVD, no bruits  Heart:S1S2 RRR without murmur, gallup, rub or click Lungs:clear without rales, rhonchi, or wheezes [id number], non tender, + BS, do not palpate liver spleen or masses Ext:no lower ext edema, 2+ pedal pulses, 2+ radial  pulses Neuro:alert and oriented, MAE, follows commands, + facial symmetry    EKG:  EKG is ordered today. The ekg ordered today demonstrates SR no acute changes from 04/2015.    Recent Labs: 04/29/2015: Magnesium 1.6* 10/06/2015: ALT 9* 10/07/2015: B Natriuretic Peptide 23.9; BUN 17; Creatinine, Ser 0.83; Hemoglobin 16.5*; Platelets 170; Potassium 4.0; Sodium 141    Lipid Panel    Component Value Date/Time   CHOL 69 12/19/2012 1302   TRIG 138.0 12/19/2012 1302   HDL 31.30* 12/19/2012 1302   CHOLHDL 2 12/19/2012 1302   VLDL 27.6 12/19/2012 1302   LDLCALC 10 12/19/2012 1302       Other studies Reviewed: Additional studies/ records that were reviewed today include: hospital notes, previous cath and echo. .   ASSESSMENT AND PLAN:  1.  Recent chest pain negative MI, she has no pain except with anxiety.  She cannot tell a difference in taking the Imdur but we will refill.  She went to hospital after getting out of house and walking down the road in her nightgown.  EMS was called, she became stressed and had pain.  They will call if more freq episodes of chest pain.  I would increase imdur at that time and if significant pain then may need stress test.  Follow up with Dr. Angelena Form first open.   2. CAD with hx of inf. STEMI 2012  3. Chronic systolic HF. euvolemic today.    Current medicines are reviewed with the patient today.  The patient Has no concerns regarding medicines.  The following changes have been made:  See above Labs/ tests ordered today include:see above  Disposition:   FU:  see above  Signed, Isaiah Serge, NP  10/25/2015 10:28 AM    [facility], [place], [place], [place] Phone: [phone]; Fax: [phone]

## 2015-10-25 ENCOUNTER — Encounter: Payer: Self-pay | Admitting: Cardiology

## 2015-10-25 ENCOUNTER — Ambulatory Visit (INDEPENDENT_AMBULATORY_CARE_PROVIDER_SITE_OTHER): Payer: Medicare Other | Admitting: Cardiology

## 2015-10-25 VITALS — BP 140/82 | HR 77 | Ht 61.0 in | Wt 157.0 lb

## 2015-10-25 DIAGNOSIS — R0789 Other chest pain: Secondary | ICD-10-CM | POA: Diagnosis not present

## 2015-10-25 DIAGNOSIS — I5022 Chronic systolic (congestive) heart failure: Secondary | ICD-10-CM | POA: Diagnosis not present

## 2015-10-25 DIAGNOSIS — I2510 Atherosclerotic heart disease of native coronary artery without angina pectoris: Secondary | ICD-10-CM | POA: Diagnosis not present

## 2015-10-25 MED ORDER — ISOSORBIDE MONONITRATE ER 30 MG PO TB24: 15.0000 mg | ORAL_TABLET | Freq: Every day | ORAL | Status: DC

## 2015-10-25 NOTE — Patient Instructions (Signed)
Medication Instructions:  Your physician recommends that you continue on your current medications as directed. Please refer to the Current Medication list given to you today.   Labwork: None ordered  Testing/Procedures: None ordered  Follow-Up: Your physician recommends that you schedule a follow-up appointment first available with Dr.Mcalhany   Any Other Special Instructions Will Be Listed Below (If Applicable).     If you need a refill on your cardiac medications before your next appointment, please call your pharmacy.

## 2015-11-04 ENCOUNTER — Encounter: Payer: Medicare Other | Admitting: Cardiovascular Disease

## 2015-11-04 NOTE — Progress Notes (Signed)
No show

## 2015-11-08 ENCOUNTER — Telehealth: Payer: Self-pay | Admitting: *Deleted

## 2015-11-08 NOTE — Telephone Encounter (Signed)
Pt's daughter cancelled pt's appt with Dr. Angelena Form scheduled for 11/03/16.  I reviewed with Cecilie Kicks, NP and if daughter feels pt is stable pt can see Dr. Angelena Form again in March.  I placed call to number listed for pt and left message to call back.

## 2015-11-08 NOTE — Telephone Encounter (Signed)
Spoke with pt's daughter. She reports pt is having a lot of problems with dementia recently.  No cardiac complaints.  Daughter is fine with March office visit with Dr. Angelena Form.  Appt made for pt to see Dr. Angelena Form on March 1,2016 at 3:00.

## 2015-11-08 NOTE — Telephone Encounter (Signed)
F/u    Pt's daughter calling back.

## 2015-12-22 ENCOUNTER — Emergency Department (HOSPITAL_COMMUNITY): Payer: Medicare Other

## 2015-12-22 ENCOUNTER — Encounter (HOSPITAL_COMMUNITY): Payer: Self-pay | Admitting: *Deleted

## 2015-12-22 ENCOUNTER — Emergency Department (HOSPITAL_COMMUNITY)
Admission: EM | Admit: 2015-12-22 | Discharge: 2015-12-22 | Disposition: A | Discharge status: Home / Self Care | Payer: Medicare Other | Attending: Emergency Medicine | Admitting: Emergency Medicine

## 2015-12-22 DIAGNOSIS — Z794 Long term (current) use of insulin: Secondary | ICD-10-CM | POA: Diagnosis not present

## 2015-12-22 DIAGNOSIS — Z9889 Other specified postprocedural states: Secondary | ICD-10-CM | POA: Diagnosis not present

## 2015-12-22 DIAGNOSIS — W19XXXA Unspecified fall, initial encounter: Secondary | ICD-10-CM

## 2015-12-22 DIAGNOSIS — W01198A Fall on same level from slipping, tripping and stumbling with subsequent striking against other object, initial encounter: Secondary | ICD-10-CM | POA: Insufficient documentation

## 2015-12-22 DIAGNOSIS — Z8669 Personal history of other diseases of the nervous system and sense organs: Secondary | ICD-10-CM | POA: Diagnosis not present

## 2015-12-22 DIAGNOSIS — S0191XA Laceration without foreign body of unspecified part of head, initial encounter: Secondary | ICD-10-CM | POA: Insufficient documentation

## 2015-12-22 DIAGNOSIS — Y9289 Other specified places as the place of occurrence of the external cause: Secondary | ICD-10-CM | POA: Diagnosis not present

## 2015-12-22 DIAGNOSIS — E785 Hyperlipidemia, unspecified: Secondary | ICD-10-CM | POA: Diagnosis not present

## 2015-12-22 DIAGNOSIS — E119 Type 2 diabetes mellitus without complications: Secondary | ICD-10-CM | POA: Diagnosis not present

## 2015-12-22 DIAGNOSIS — I10 Essential (primary) hypertension: Secondary | ICD-10-CM | POA: Insufficient documentation

## 2015-12-22 DIAGNOSIS — Z79899 Other long term (current) drug therapy: Secondary | ICD-10-CM | POA: Diagnosis not present

## 2015-12-22 DIAGNOSIS — Y998 Other external cause status: Secondary | ICD-10-CM | POA: Diagnosis not present

## 2015-12-22 DIAGNOSIS — F17210 Nicotine dependence, cigarettes, uncomplicated: Secondary | ICD-10-CM | POA: Diagnosis not present

## 2015-12-22 DIAGNOSIS — M1990 Unspecified osteoarthritis, unspecified site: Secondary | ICD-10-CM | POA: Diagnosis not present

## 2015-12-22 DIAGNOSIS — Y9389 Activity, other specified: Secondary | ICD-10-CM | POA: Diagnosis not present

## 2015-12-22 DIAGNOSIS — I2510 Atherosclerotic heart disease of native coronary artery without angina pectoris: Secondary | ICD-10-CM | POA: Diagnosis not present

## 2015-12-22 DIAGNOSIS — Z87442 Personal history of urinary calculi: Secondary | ICD-10-CM | POA: Diagnosis not present

## 2015-12-22 DIAGNOSIS — F0390 Unspecified dementia without behavioral disturbance: Secondary | ICD-10-CM | POA: Insufficient documentation

## 2015-12-22 DIAGNOSIS — Z7902 Long term (current) use of antithrombotics/antiplatelets: Secondary | ICD-10-CM | POA: Diagnosis not present

## 2015-12-22 DIAGNOSIS — K219 Gastro-esophageal reflux disease without esophagitis: Secondary | ICD-10-CM | POA: Diagnosis not present

## 2015-12-22 DIAGNOSIS — Z880 Allergy status to penicillin: Secondary | ICD-10-CM | POA: Insufficient documentation

## 2015-12-22 DIAGNOSIS — J45909 Unspecified asthma, uncomplicated: Secondary | ICD-10-CM | POA: Diagnosis not present

## 2015-12-22 DIAGNOSIS — E663 Overweight: Secondary | ICD-10-CM | POA: Diagnosis not present

## 2015-12-22 DIAGNOSIS — S0990XA Unspecified injury of head, initial encounter: Secondary | ICD-10-CM | POA: Insufficient documentation

## 2015-12-22 LAB — URINALYSIS, ROUTINE W REFLEX MICROSCOPIC
GLUCOSE, UA: NEGATIVE mg/dL
Hgb urine dipstick: NEGATIVE
Ketones, ur: 15 mg/dL — AB
LEUKOCYTES UA: NEGATIVE
NITRITE: NEGATIVE
PH: 5.5 (ref 5.0–8.0)
Protein, ur: 30 mg/dL — AB
SPECIFIC GRAVITY, URINE: 1.027 (ref 1.005–1.030)

## 2015-12-22 LAB — URINE MICROSCOPIC-ADD ON

## 2015-12-22 LAB — CBC
HEMATOCRIT: 44.5 % (ref 36.0–46.0)
HEMOGLOBIN: 14.6 g/dL (ref 12.0–15.0)
MCH: 32 pg (ref 26.0–34.0)
MCHC: 32.8 g/dL (ref 30.0–36.0)
MCV: 97.6 fL (ref 78.0–100.0)
Platelets: 193 10*3/uL (ref 150–400)
RBC: 4.56 MIL/uL (ref 3.87–5.11)
RDW: 12.9 % (ref 11.5–15.5)
WBC: 10.2 10*3/uL (ref 4.0–10.5)

## 2015-12-22 LAB — BASIC METABOLIC PANEL
ANION GAP: 14 (ref 5–15)
BUN: 21 mg/dL — ABNORMAL HIGH (ref 6–20)
CHLORIDE: 102 mmol/L (ref 101–111)
CO2: 24 mmol/L (ref 22–32)
CREATININE: 1.04 mg/dL — AB (ref 0.44–1.00)
Calcium: 8.8 mg/dL — ABNORMAL LOW (ref 8.9–10.3)
GFR calc non Af Amer: 52 mL/min — ABNORMAL LOW (ref >60)
GFR, EST AFRICAN AMERICAN: 60 mL/min — AB (ref >60)
GLUCOSE: 148 mg/dL — AB (ref 65–99)
Potassium: 3.5 mmol/L (ref 3.5–5.1)
Sodium: 140 mmol/L (ref 135–145)

## 2015-12-22 NOTE — ED Notes (Signed)
Pt arrives to the ER via EMS after a fall; pt was getting OOB and fell against the dresser; pt and family deny LOC; pt with small laceration to back of head; bleeding controlled at present; pt denies neck and back pain; pt with a hx of dementia

## 2015-12-22 NOTE — ED Provider Notes (Signed)
CSN: [id number]     Arrival date & time 12/22/15  0436 History   First MD Initiated Contact with Patient 12/22/15 0440     Chief Complaint  Patient presents with  . Fall    Level V caveat: Dementia.  HPI Patient presents to the emergency department after a fall.  The family and caretaker report that she was trying to change her underwear as she had stooled on herself.  The patient reports that her feet became tangled.  She fell and struck the back of her head against the dresser.  Family reports no loss consciousness.  Patient presents with a very small abrasion/laceration of the posterior aspect of her head.  No active bleeding.  Family reports the patient has been a little bit more confused lately but not too different and she's been in the past with her dementia.  No reports of vomiting or diarrhea.  Reportedly eating at baseline for her.  No other complaints.    Past Medical History  Diagnosis Date  . CAD (coronary artery disease)     a. s/p inferior STEMI 12/29/10 with rotablator atherectomy RCA 12/31/10 and DES x 2 RCA;  b. Lexiscan Myoview (02/2015): mild reversible apical anterior perfusion defect. C. cath 02/2015 50% LAD lesion with patent stent, Tx Rx    . Ischemic cardiomyopathy     EF 45%cath 2012 EF55% 5/12; 35% 11/14  . Diabetes mellitus     type 2  . HTN (hypertension)   . Hyperlipidemia   . Diverticulosis   . Respiratory arrest//ACE Inhibitor presumed cause   . COPD (chronic obstructive pulmonary disease) ([place])   . Small bowel obstruction (H[facility])     from a sigmoid stricture  . Nephrolithiasis   . Sleep apnea   . Overweight(278.02)   . Osteoarthritis   . GERD (gastroesophageal reflux disease)   . Anxiety   . Glaucoma   . Memory deficit   . Dementia    Past Surgical History  Procedure Laterality Date  . Bilateral tubal ligation    . Lithotripsy    . 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 Cardiac surgery    . Cardiac catheterization  N/A 03/18/2015    Procedure: LEFT HEART CATH AND CORONARY ANGIOGRAPHY;  Surgeon: Lorretta Harp, MD;  Location: [facility] CATH LAB;  Service: Cardiovascular;  Laterality: N/A;   Family History  Problem Relation Age of Onset  . Breast cancer Maternal Grandmother   . Diabetes Father   . Heart disease Father   . Diabetes Sister   . Colon cancer Neg Hx   . Heart attack Father   . Hypertension Sister   . Stroke Mother    Social History  Substance Use Topics  . Smoking status: Current Every Day Smoker -- 1.00 packs/day for 25 years    Types: Cigarettes  . Smokeless tobacco: Never Used     Comment: over a ppd for 40+ years; quit 10/2004.  unsuccessfully tried eCigs.  . Alcohol Use: No   OB History    No data available     Review of Systems  Unable to perform ROS: Dementia      Allergies  Lisinopril; Spironolactone; Donepezil; Eggs or egg-derived products; Erythromycin; Macrolides and ketolides; Penicillins; Quinapril hcl; Angiotensin receptor blockers; Lipitor; Pentazocine lactate; Propoxyphene hcl; Sulfonamide derivatives; and Vancomycin  Home Medications   Prior to Admission medications   Medication Sig Start Date End Date Taking? Authorizing Provider  albuterol (PROVENTIL HFA;VENTOLIN HFA) 108 (  90 BASE) MCG/ACT inhaler Inhale 1-2 puffs into the lungs every 6 (six) hours as needed for wheezing.     Historical Provider, MD  albuterol (PROVENTIL) (2.5 MG/3ML) 0.083% nebulizer solution Take 3 mLs (2.5 mg total) by nebulization 4 (four) times daily. 07/22/15   Brand Males, MD  aspirin EC 81 MG tablet Take 81 mg by mouth daily.    Historical Provider, MD  budesonide (PULMICORT) 0.25 MG/2ML nebulizer solution Take 2 mLs (0.25 mg total) by nebulization 2 (two) times daily. 07/22/15   Brand Males, MD  cefpodoxime (VANTIN) 200 MG tablet Take 1 tablet (200 mg total) by mouth 2 (two) times daily. 10/07/15   Thurnell Lose, MD  clopidogrel (PLAVIX) 75 MG tablet TAKE 1 TABLET BY MOUTH  EVERY DAY 11/11/14   Burnell Blanks, MD  furosemide (LASIX) 20 MG tablet Take 20 mg by mouth daily. EDEMA    Historical Provider, MD  insulin glargine (LANTUS) 100 UNIT/ML injection Inject 0.1 mLs (10 Units total) into the skin daily. 01/25/14   Hosie Poisson, MD  insulin lispro (HUMALOG) 100 UNIT/ML injection Inject 0-10 Units into the skin 3 (three) times daily before meals. Sliding scale: below 150=0 units, 150-200=2 units, 201-250=4 units, 251-300=6 units, 301-350=8 units, above 350=10 units    Historical Provider, MD  ipratropium (ATROVENT) 0.02 % nebulizer solution Take 2.5 mLs (0.5 mg total) by nebulization 4 (four) times daily. DX CODE J44.9 Patient taking differently: Take 0.5 mg by nebulization every 6 (six) hours as needed for wheezing.  07/22/15   Brand Males, MD  isosorbide mononitrate (IMDUR) 30 MG 24 hr tablet Take 0.5 tablets (15 mg total) by mouth daily. 10/25/15   Isaiah Serge, NP  LORazepam (ATIVAN) 0.5 MG tablet Take 0.5 mg by mouth 2 (two) times daily as needed for anxiety.    Historical Provider, MD  metFORMIN (GLUCOPHAGE) 500 MG tablet Take 2,000 mg by mouth daily before supper. Reported on 12/22/2015    Historical Provider, MD  metoprolol succinate (TOPROL-XL) 25 MG 24 hr tablet Take 1 tablet (25 mg total) by mouth daily. 10/14/13   Donita Brooks, NP  mometasone-formoterol (DULERA) 100-5 MCG/ACT AERO Inhale 2 puffs into the lungs 2 (two) times daily.    Historical Provider, MD  NAMENDA XR 28 MG CP24 24 hr capsule TAKE ONE CAPSULE BY MOUTH EVERY DAY 02/08/15   Ward Givens, NP  nitroGLYCERIN (NITROSTAT) 0.4 MG SL tablet Place 1 tablet (0.4 mg total) under the tongue every 5 (five) minutes as needed. For chest pain. 06/18/13   Burnell Blanks, MD  omeprazole (PRILOSEC) 20 MG capsule Take 20 mg by mouth as needed (REFLUX).    Historical Provider, MD  oxyCODONE-acetaminophen (PERCOCET/ROXICET) 5-325 MG tablet Take 1 tablet by mouth every 4 (four) hours as needed for  moderate pain or severe pain.    Historical Provider, MD  PARoxetine (PAXIL) 10 MG tablet Take 10 mg by mouth daily.    Historical Provider, MD  pravastatin (PRAVACHOL) 40 MG tablet TAKE 1 TABLET (40 MG TOTAL) BY MOUTH DAILY. <PLEASE MAKE APPOINTMENT FOR REFILLS> 12/25/14   Burnell Blanks, MD   BP 179/129 mmHg  Pulse 68  Temp(Src) 97.6 F (36.4 C) (Oral)  Resp 20  SpO2 96% Physical Exam  Constitutional: She appears well-developed and well-nourished. No distress.  HENT:  Head: Normocephalic and atraumatic.  Small punctate posterior scalp laceration/abrasion.  Eyes: EOM are normal. Pupils are equal, round, and reactive to light.  Neck: Normal range of  motion.  Cardiovascular: Normal rate, regular rhythm and normal heart sounds.   Pulmonary/Chest: Effort normal and breath sounds normal.  Abdominal: Soft. She exhibits no distension. There is no tenderness.  Musculoskeletal: Normal range of motion.  Neurological: She is alert.  5/5 strength in major muscle groups of  bilateral upper and lower extremities. Speech normal. No facial asymetry.   Skin: Skin is warm and dry.  Psychiatric: She has a normal mood and affect. Judgment normal.  Nursing note and vitals reviewed.   ED Course  Procedures (including critical care time) Labs Review Labs Reviewed  BASIC METABOLIC PANEL - Abnormal; Notable for the following:    Glucose, Bld 148 (*)    BUN 21 (*)    Creatinine, Ser 1.04 (*)    Calcium 8.8 (*)    GFR calc non Af Amer 52 (*)    GFR calc Af Amer 60 (*)    All other components within normal limits  URINALYSIS, ROUTINE W REFLEX MICROSCOPIC (NOT AT [facility]) - Abnormal; Notable for the following:    Color, Urine AMBER (*)    APPearance CLOUDY (*)    Bilirubin Urine MODERATE (*)    Ketones, ur 15 (*)    Protein, ur 30 (*)    All other components within normal limits  URINE MICROSCOPIC-ADD ON - Abnormal; Notable for the following:    Squamous Epithelial / LPF 0-5 (*)    Bacteria,  UA FEW (*)    Casts HYALINE CASTS (*)    All other components within normal limits  CBC    Imaging Review Ct Head Wo Contrast  12/22/2015  CLINICAL DATA:  Fall getting out of bed with posterior head laceration. Initial encounter. EXAM: CT HEAD WITHOUT CONTRAST TECHNIQUE: Contiguous axial images were obtained from the base of the skull through the vertex without intravenous contrast. COMPARISON:  05/06/2014 FINDINGS: Skull and Sinuses: Occipital scalp contusion. Negative for fracture. Hypodense secretions layer within left maxillary sinus. Visualized orbits: Negative. Brain: No evidence of acute infarction, hemorrhage, hydrocephalus, or mass lesion/mass effect. Stable pattern of patchy low-density in the cerebral white matter consistent with chronic microvascular ischemia. There have been remote lacunar infarcts in the right thalamus and deep white matter tracts. Cerebral volume loss with ex vacuo ventricular enlargement, mildly progressed since 2015. IMPRESSION: 1. No acute intracranial finding or fracture. 2. Left maxillary sinus fluid level. 3. Atrophy and chronic small vessel disease. Electronically Signed   By: Monte Fantasia M.D.   On: 12/22/2015 05:33   I have personally reviewed and evaluated these images and lab results as part of my medical decision-making.   EKG Interpretation None      MDM   Final diagnoses:  Fall, initial encounter  Minor head injury, initial encounter  Dementia, without behavioral disturbance    Labs urine and a head CT without abnormality.  Discharge home with primary care follow-up.  Area in the posterior scalp is too small and does not need to be repaired    Jola Schmidt, MD 12/22/15 [phone]

## 2016-01-01 ENCOUNTER — Observation Stay (HOSPITAL_COMMUNITY)
Admission: EM | Admit: 2016-01-01 | Discharge: 2016-01-05 | Disposition: A | Discharge status: Home / Self Care | Payer: Medicare Other | Attending: Internal Medicine | Admitting: Internal Medicine

## 2016-01-01 ENCOUNTER — Encounter (HOSPITAL_COMMUNITY): Payer: Self-pay | Admitting: Emergency Medicine

## 2016-01-01 ENCOUNTER — Emergency Department (HOSPITAL_COMMUNITY): Payer: Medicare Other

## 2016-01-01 DIAGNOSIS — J449 Chronic obstructive pulmonary disease, unspecified: Secondary | ICD-10-CM | POA: Diagnosis not present

## 2016-01-01 DIAGNOSIS — K219 Gastro-esophageal reflux disease without esophagitis: Secondary | ICD-10-CM | POA: Diagnosis not present

## 2016-01-01 DIAGNOSIS — E872 Acidosis, unspecified: Secondary | ICD-10-CM

## 2016-01-01 DIAGNOSIS — E785 Hyperlipidemia, unspecified: Secondary | ICD-10-CM | POA: Diagnosis present

## 2016-01-01 DIAGNOSIS — M1990 Unspecified osteoarthritis, unspecified site: Secondary | ICD-10-CM | POA: Insufficient documentation

## 2016-01-01 DIAGNOSIS — Z7984 Long term (current) use of oral hypoglycemic drugs: Secondary | ICD-10-CM | POA: Insufficient documentation

## 2016-01-01 DIAGNOSIS — H409 Unspecified glaucoma: Secondary | ICD-10-CM | POA: Diagnosis not present

## 2016-01-01 DIAGNOSIS — Z880 Allergy status to penicillin: Secondary | ICD-10-CM | POA: Diagnosis not present

## 2016-01-01 DIAGNOSIS — Z7951 Long term (current) use of inhaled steroids: Secondary | ICD-10-CM | POA: Insufficient documentation

## 2016-01-01 DIAGNOSIS — M549 Dorsalgia, unspecified: Secondary | ICD-10-CM

## 2016-01-01 DIAGNOSIS — I255 Ischemic cardiomyopathy: Secondary | ICD-10-CM | POA: Insufficient documentation

## 2016-01-01 DIAGNOSIS — Z792 Long term (current) use of antibiotics: Secondary | ICD-10-CM | POA: Diagnosis not present

## 2016-01-01 DIAGNOSIS — R0682 Tachypnea, not elsewhere classified: Secondary | ICD-10-CM | POA: Insufficient documentation

## 2016-01-01 DIAGNOSIS — Z794 Long term (current) use of insulin: Secondary | ICD-10-CM | POA: Insufficient documentation

## 2016-01-01 DIAGNOSIS — K5669 Other intestinal obstruction: Secondary | ICD-10-CM | POA: Diagnosis not present

## 2016-01-01 DIAGNOSIS — R053 Chronic cough: Secondary | ICD-10-CM | POA: Diagnosis present

## 2016-01-01 DIAGNOSIS — Z79899 Other long term (current) drug therapy: Secondary | ICD-10-CM | POA: Diagnosis not present

## 2016-01-01 DIAGNOSIS — R4182 Altered mental status, unspecified: Secondary | ICD-10-CM | POA: Diagnosis present

## 2016-01-01 DIAGNOSIS — I2510 Atherosclerotic heart disease of native coronary artery without angina pectoris: Secondary | ICD-10-CM | POA: Diagnosis not present

## 2016-01-01 DIAGNOSIS — E119 Type 2 diabetes mellitus without complications: Secondary | ICD-10-CM | POA: Diagnosis not present

## 2016-01-01 DIAGNOSIS — F17210 Nicotine dependence, cigarettes, uncomplicated: Secondary | ICD-10-CM | POA: Diagnosis not present

## 2016-01-01 DIAGNOSIS — G4730 Sleep apnea, unspecified: Secondary | ICD-10-CM | POA: Insufficient documentation

## 2016-01-01 DIAGNOSIS — K5790 Diverticulosis of intestine, part unspecified, without perforation or abscess without bleeding: Secondary | ICD-10-CM | POA: Insufficient documentation

## 2016-01-01 DIAGNOSIS — Z9889 Other specified postprocedural states: Secondary | ICD-10-CM | POA: Insufficient documentation

## 2016-01-01 DIAGNOSIS — R05 Cough: Secondary | ICD-10-CM | POA: Diagnosis present

## 2016-01-01 DIAGNOSIS — R413 Other amnesia: Secondary | ICD-10-CM | POA: Diagnosis present

## 2016-01-01 DIAGNOSIS — G9340 Encephalopathy, unspecified: Secondary | ICD-10-CM

## 2016-01-01 DIAGNOSIS — Z87442 Personal history of urinary calculi: Secondary | ICD-10-CM | POA: Insufficient documentation

## 2016-01-01 DIAGNOSIS — I10 Essential (primary) hypertension: Secondary | ICD-10-CM | POA: Diagnosis not present

## 2016-01-01 DIAGNOSIS — Z7982 Long term (current) use of aspirin: Secondary | ICD-10-CM | POA: Diagnosis not present

## 2016-01-01 DIAGNOSIS — F0390 Unspecified dementia without behavioral disturbance: Secondary | ICD-10-CM | POA: Diagnosis present

## 2016-01-01 LAB — URINE MICROSCOPIC-ADD ON

## 2016-01-01 LAB — CBC
HCT: 44 % (ref 36.0–46.0)
HEMOGLOBIN: 14.9 g/dL (ref 12.0–15.0)
MCH: 32.1 pg (ref 26.0–34.0)
MCHC: 33.9 g/dL (ref 30.0–36.0)
MCV: 94.8 fL (ref 78.0–100.0)
PLATELETS: 195 10*3/uL (ref 150–400)
RBC: 4.64 MIL/uL (ref 3.87–5.11)
RDW: 12.9 % (ref 11.5–15.5)
WBC: 10.7 10*3/uL — ABNORMAL HIGH (ref 4.0–10.5)

## 2016-01-01 LAB — URINALYSIS, ROUTINE W REFLEX MICROSCOPIC
Glucose, UA: NEGATIVE mg/dL
HGB URINE DIPSTICK: NEGATIVE
KETONES UR: 15 mg/dL — AB
Nitrite: NEGATIVE
PROTEIN: 30 mg/dL — AB
Specific Gravity, Urine: 1.019 (ref 1.005–1.030)
pH: 6 (ref 5.0–8.0)

## 2016-01-01 LAB — I-STAT CG4 LACTIC ACID, ED: LACTIC ACID, VENOUS: 2.46 mmol/L — AB (ref 0.5–2.0)

## 2016-01-01 LAB — CBG MONITORING, ED: GLUCOSE-CAPILLARY: 201 mg/dL — AB (ref 65–99)

## 2016-01-01 NOTE — ED Notes (Signed)
cbg is 201

## 2016-01-01 NOTE — ED Notes (Signed)
Per EMS, patient is from home with caregiver. Patient has been having more problems ambulating and communicating than she typically does when last caregiver compares. She keeps yelling for "help", but doesn't report any problems. Problems with coordinating legs with ems. Also strong smell of ammonia. Upper right tenderness of abdomen per ems. Vs: bp 122/76, p 100 normal sinus, rr 24, cbg is 190.

## 2016-01-01 NOTE — ED Provider Notes (Signed)
CSN: [id number]     Arrival date & time 01/01/16  2216 History   By signing my name below, I, Randa Evens, attest that this documentation has been prepared under the direction and in the presence of Sharlett Iles, MD. Electronically Signed: Randa Evens, ED Scribe. 01/02/2016. 12:37 AM.      Chief Complaint  Patient presents with  . Altered Mental Status   The history is provided by a relative. No language interpreter was used.   HPI Comments: Level 5 Caveat: Altered Mental Status  [name] is a 75 y.o. female w/ extensive PMH including CAD, T2DM, SBO, dementia, brought in by ambulance, who presents to the Emergency Department for Altered mental status. Family member states that she was having trouble ambulating earlier tonight. She states that she looked as if she was having a hard time using her right side. She reports speech difficulty initially. Family member states that now all she will do is call for help. Family denies noticing fever, cough or vomiting. Family member does report that she had a fall 2 weeks prior for which she was evaluated in ED.  Pt reports slight abdominal pain, unable to characterize further.   Past Medical History  Diagnosis Date  . CAD (coronary artery disease)     a. s/p inferior STEMI 12/29/10 with rotablator atherectomy RCA 12/31/10 and DES x 2 RCA;  b. Lexiscan Myoview (02/2015): mild reversible apical anterior perfusion defect. C. cath 02/2015 50% LAD lesion with patent stent, Tx Rx    . Ischemic cardiomyopathy     EF 45%cath 2012 EF55% 5/12; 35% 11/14  . Diabetes mellitus     type 2  . HTN (hypertension)   . Hyperlipidemia   . Diverticulosis   . Respiratory arrest//ACE Inhibitor presumed cause   . COPD (chronic obstructive pulmonary disease) ([place])   . Small bowel obstruction ([facility])     from a sigmoid stricture  . Nephrolithiasis   . Sleep apnea   . Overweight(278.02)   . Osteoarthritis   . GERD (gastroesophageal reflux disease)    . Anxiety   . Glaucoma   . Memory deficit   . Dementia    Past Surgical History  Procedure Laterality Date  . Bilateral tubal ligation    . Lithotripsy    . 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 Cardiac surgery    . Cardiac catheterization N/A 03/18/2015    Procedure: LEFT HEART CATH AND CORONARY ANGIOGRAPHY;  Surgeon: Lorretta Harp, MD;  Location: [facility] CATH LAB;  Service: Cardiovascular;  Laterality: N/A;   Family History  Problem Relation Age of Onset  . Breast cancer Maternal Grandmother   . Diabetes Father   . Heart disease Father   . Diabetes Sister   . Colon cancer Neg Hx   . Heart attack Father   . Hypertension Sister   . Stroke Mother    Social History  Substance Use Topics  . Smoking status: Current Every Day Smoker -- 1.00 packs/day for 25 years    Types: Cigarettes  . Smokeless tobacco: Never Used     Comment: over a ppd for 40+ years; quit 10/2004.  unsuccessfully tried eCigs.  . Alcohol Use: No   OB History    No data available     Review of Systems  Unable to perform ROS: Mental status change      Allergies  Lisinopril; Spironolactone; Donepezil; Eggs or egg-derived products; Erythromycin; Macrolides and ketolides; Penicillins;  Quinapril hcl; Angiotensin receptor blockers; Lipitor; Pentazocine lactate; Propoxyphene hcl; Sulfonamide derivatives; and Vancomycin  Home Medications   Prior to Admission medications   Medication Sig Start Date End Date Taking? Authorizing Provider  aspirin EC 81 MG tablet Take 81 mg by mouth daily.   Yes Historical Provider, MD  clopidogrel (PLAVIX) 75 MG tablet TAKE 1 TABLET BY MOUTH EVERY DAY 11/11/14  Yes Burnell Blanks, MD  isosorbide mononitrate (IMDUR) 30 MG 24 hr tablet Take 0.5 tablets (15 mg total) by mouth daily. 10/25/15  Yes Isaiah Serge, NP  metFORMIN (GLUCOPHAGE) 500 MG tablet Take 2,000 mg by mouth daily before supper. Reported on 12/22/2015   Yes Historical Provider,  MD  metoprolol succinate (TOPROL-XL) 25 MG 24 hr tablet Take 1 tablet (25 mg total) by mouth daily. 10/14/13  Yes Donita Brooks, NP  PARoxetine (PAXIL) 10 MG tablet Take 10 mg by mouth daily.   Yes Historical Provider, MD  pravastatin (PRAVACHOL) 40 MG tablet TAKE 1 TABLET (40 MG TOTAL) BY MOUTH DAILY. <PLEASE MAKE APPOINTMENT FOR REFILLS> 12/25/14  Yes Burnell Blanks, MD  albuterol (PROVENTIL HFA;VENTOLIN HFA) 108 (90 BASE) MCG/ACT inhaler Inhale 1-2 puffs into the lungs every 6 (six) hours as needed for wheezing.     Historical Provider, MD  albuterol (PROVENTIL) (2.5 MG/3ML) 0.083% nebulizer solution Take 3 mLs (2.5 mg total) by nebulization 4 (four) times daily. 07/22/15   Brand Males, MD  budesonide (PULMICORT) 0.25 MG/2ML nebulizer solution Take 2 mLs (0.25 mg total) by nebulization 2 (two) times daily. 07/22/15   Brand Males, MD  cefpodoxime (VANTIN) 200 MG tablet Take 1 tablet (200 mg total) by mouth 2 (two) times daily. 10/07/15   Thurnell Lose, MD  furosemide (LASIX) 20 MG tablet Take 20 mg by mouth daily. EDEMA    Historical Provider, MD  insulin glargine (LANTUS) 100 UNIT/ML injection Inject 0.1 mLs (10 Units total) into the skin daily. 01/25/14   Hosie Poisson, MD  insulin lispro (HUMALOG) 100 UNIT/ML injection Inject 0-10 Units into the skin 3 (three) times daily before meals. Sliding scale: below 150=0 units, 150-200=2 units, 201-250=4 units, 251-300=6 units, 301-350=8 units, above 350=10 units    Historical Provider, MD  ipratropium (ATROVENT) 0.02 % nebulizer solution Take 2.5 mLs (0.5 mg total) by nebulization 4 (four) times daily. DX CODE J44.9 Patient taking differently: Take 0.5 mg by nebulization every 6 (six) hours as needed for wheezing.  07/22/15   Brand Males, MD  LORazepam (ATIVAN) 0.5 MG tablet Take 0.5 mg by mouth 2 (two) times daily as needed for anxiety.    Historical Provider, MD  mometasone-formoterol (DULERA) 100-5 MCG/ACT AERO Inhale 2 puffs into the  lungs 2 (two) times daily.    Historical Provider, MD  NAMENDA XR 28 MG CP24 24 hr capsule TAKE ONE CAPSULE BY MOUTH EVERY DAY 02/08/15   Ward Givens, NP  nitroGLYCERIN (NITROSTAT) 0.4 MG SL tablet Place 1 tablet (0.4 mg total) under the tongue every 5 (five) minutes as needed. For chest pain. 06/18/13   Burnell Blanks, MD  omeprazole (PRILOSEC) 20 MG capsule Take 20 mg by mouth as needed (REFLUX).    Historical Provider, MD  oxyCODONE-acetaminophen (PERCOCET/ROXICET) 5-325 MG tablet Take 1 tablet by mouth every 4 (four) hours as needed for moderate pain or severe pain.    Historical Provider, MD   BP 124/108 mmHg  Pulse 84  Temp(Src) 99.6 F (37.6 C) (Rectal)  Resp 25  Ht 5\' 3"  (1.6  m)  Wt 157 lb (71.215 kg)  BMI 27.82 kg/m2  SpO2 95%   Physical Exam  Constitutional: She appears well-developed and well-nourished. No distress.  Repeatedly yelling "help  HENT:  Head: Normocephalic and atraumatic.  Dry mucous membranes  Eyes: Conjunctivae are normal. Pupils are equal, round, and reactive to light.  Neck: Neck supple.  Cardiovascular: Normal rate, regular rhythm and normal heart sounds.   No murmur heard. Pulmonary/Chest: Effort normal. Tachypnea noted. She has decreased breath sounds. She has wheezes.  Mild tachypnea with diminished breath sounds, faint expiratory wheezes  Abdominal: Soft. Bowel sounds are normal. She exhibits no distension. There is tenderness. There is no rebound and no guarding.  TTP of RUQ and LLQ  Musculoskeletal: She exhibits no edema.  Neurological: She is alert.  Disoriented intermittently follows commands moving all 4 extremities no facial asymmetry.   Skin: Skin is warm and dry. No rash noted.  Psychiatric:  Mildy agitated   Nursing note and vitals reviewed.   ED Course  Procedures (including critical care time) DIAGNOSTIC STUDIES: Oxygen Saturation is 97% on RA, normal by my interpretation.    COORDINATION OF CARE:    Labs  Review Labs Reviewed  COMPREHENSIVE METABOLIC PANEL - Abnormal; Notable for the following:    Chloride 97 (*)    Glucose, Bld 216 (*)    Creatinine, Ser 1.03 (*)    ALT 13 (*)    GFR calc non Af Amer 52 (*)    All other components within normal limits  CBC - Abnormal; Notable for the following:    WBC 10.7 (*)    All other components within normal limits  URINALYSIS, ROUTINE W REFLEX MICROSCOPIC (NOT AT [facility]) - Abnormal; Notable for the following:    Color, Urine AMBER (*)    Bilirubin Urine SMALL (*)    Ketones, ur 15 (*)    Protein, ur 30 (*)    Leukocytes, UA TRACE (*)    All other components within normal limits  URINE MICROSCOPIC-ADD ON - Abnormal; Notable for the following:    Squamous Epithelial / LPF 0-5 (*)    Bacteria, UA RARE (*)    Casts HYALINE CASTS (*)    All other components within normal limits  CBG MONITORING, ED - Abnormal; Notable for the following:    Glucose-Capillary 201 (*)    All other components within normal limits  I-STAT CG4 LACTIC ACID, ED - Abnormal; Notable for the following:    Lactic Acid, Venous 2.46 (*)    All other components within normal limits  I-STAT CG4 LACTIC ACID, ED - Abnormal; Notable for the following:    Lactic Acid, Venous 2.30 (*)    All other components within normal limits  BRAIN NATRIURETIC PEPTIDE    Imaging Review Dg Chest 2 View  01/01/2016  CLINICAL DATA:  Acute onset of altered mental status. Initial encounter. EXAM: CHEST  2 VIEW COMPARISON:  Chest radiograph performed 10/06/2015 FINDINGS: The lungs are well-aerated. A small left pleural effusion is noted. There is no evidence of focal opacification, pleural effusion or pneumothorax. The heart is borderline enlarged. No acute osseous abnormalities are seen. IMPRESSION: Small left pleural effusion noted. Lungs otherwise clear. Borderline cardiomegaly. Electronically Signed   By: Garald Balding M.D.   On: 01/01/2016 23:43   Ct Head Wo Contrast  01/02/2016  CLINICAL  DATA:  Altered mental status, difficulty ambulating and communicating. History of dementia, hypertension, diabetes, hyperlipidemia. EXAM: CT HEAD WITHOUT CONTRAST TECHNIQUE: Contiguous axial images  were obtained from the base of the skull through the vertex without intravenous contrast. COMPARISON:  CT head December 22, 2015 FINDINGS: Moderate ventriculomegaly on the basis of global parenchymal brain volume loss, advanced for age. No intraparenchymal hemorrhage, mass effect nor midline shift. Patchy to confluent supratentorial white matter hypodensities. No acute large vascular territory infarcts. 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 No abnormal extra-axial fluid collections. Basal cisterns are patent. Moderate calcific atherosclerosis of the carotid siphons. No skull fracture. The included ocular globes and orbital contents are non-suspicious. Mild paranasal sinus mucosal thickening without air-fluid levels. The mastoid air cells are well aerated. Moderate LEFT temporomandibular osteoarthrosis. IMPRESSION: No acute intracranial process. Stable appearance of the head including moderate global brain atrophy, old RIGHT basal ganglia and thalamus lacunar infarcts. Electronically Signed   By: Elon Alas M.D.   On: 01/02/2016 01:50   Ct Abdomen Pelvis W Contrast  01/02/2016  CLINICAL DATA:  75 year old female with right upper quadrant abdominal pain as well as left lower quadrant pain. EXAM: CT ABDOMEN AND PELVIS WITH CONTRAST TECHNIQUE: Multidetector CT imaging of the abdomen and pelvis was performed using the standard protocol following bolus administration of intravenous contrast. CONTRAST:  56mL OMNIPAQUE IOHEXOL 300 MG/ML  SOLN COMPARISON:  CT dated 05/06/2015 FINDINGS: Evaluation of this exam is limited due to respiratory motion artifact. The visualized lung bases are clear. There is coronary vascular calcification. No intra-abdominal free air or free fluid. Small focal density at the  gallbladder fundus may represent a focal adenomyomatosis. A noncalcified stone is less likely not excluded. Ultrasound may provide better evaluation the gallbladder if clinically indicated. There is no pericholecystic fluid. The liver, pancreas, spleen, and adrenal glands appear unremarkable. There is no hydronephrosis or nephrolithiasis on either side. Subcentimeter right renal hypodense lesion is not well characterized but may represent a cyst. The visualized ureters and urinary bladder appear unremarkable. The uterus and ovaries are grossly unremarkable. Evaluation of the bowel is limited in the absence of oral contrast. There is partial sigmoid resection with anastomotic sutures. There is no evidence of bowel obstruction or inflammation. The appendix is not visualized with certainty. No inflammatory changes identified in the right lower quadrant. Advanced aortoiliac atherosclerotic disease. There is ectasia of the infrarenal abdominal aorta. No portal venous gas identified. There is no adenopathy. The abdominal wall soft tissues appear unremarkable. There is osteopenia with degenerative changes of the spine. Old-appearing compression deformity of the superior endplate of the L5 vertebra No acute fracture. IMPRESSION: No definite acute intra-abdominal or pelvic pathology. Electronically Signed   By: Anner Crete M.D.   On: 01/02/2016 02:01   I have personally reviewed and evaluated these lab results as part of my medical decision-making.   EKG Interpretation None     Medications  sodium chloride 0.9 % bolus 500 mL (0 mLs Intravenous Stopped 01/02/16 0342)  iohexol (OMNIPAQUE) 300 MG/ML solution 100 mL (80 mLs Intravenous Contrast Given 01/02/16 0124)    MDM   Final diagnoses:  Altered mental status, unspecified altered mental status type  Lactic acidosis   Patient with extensive past medical history including dementia brought in by her granddaughter for concerns for difficulty ambulating  earlier tonight as well as occasional complaints of abdominal pain and yelling for help. On exam, she was awake, alert, mildly agitated and repeatedly yelling "help." She had left lower quadrant and right upper quadrant tenderness to palpation without peritonitis or distention. Obtained above lab work as well as chest x-ray. Initial labs  notable for lactate 2.46, UA with ketones and protein but no signs of infection. Gave patient a small fluid bolus. Head CT negative acute. Chest x-ray was small pleural effusion and borderline cardiomegaly but otherwise unremarkable. Obtained BNP which was normal. Gave the patient a second IV fluid bolus because of her lactate; repeat was 2.3. Because of the patient's altered mentation from her baseline and her lactic acidosis, I discussed admission with the patient's granddaughter who agreed. Discussed with [facility], Dr. Alcario Drought, and pt admitted for further care.   I personally performed the services described in this documentation, which was scribed in my presence. The recorded information has been reviewed and is accurate.    Sharlett Iles, MD 01/02/16 0700

## 2016-01-02 ENCOUNTER — Emergency Department (HOSPITAL_COMMUNITY): Payer: Medicare Other

## 2016-01-02 ENCOUNTER — Observation Stay (HOSPITAL_COMMUNITY): Payer: Medicare Other

## 2016-01-02 DIAGNOSIS — J411 Mucopurulent chronic bronchitis: Secondary | ICD-10-CM

## 2016-01-02 DIAGNOSIS — E785 Hyperlipidemia, unspecified: Secondary | ICD-10-CM

## 2016-01-02 DIAGNOSIS — G9340 Encephalopathy, unspecified: Secondary | ICD-10-CM | POA: Diagnosis not present

## 2016-01-02 DIAGNOSIS — E872 Acidosis, unspecified: Secondary | ICD-10-CM | POA: Diagnosis present

## 2016-01-02 DIAGNOSIS — R413 Other amnesia: Secondary | ICD-10-CM

## 2016-01-02 DIAGNOSIS — R05 Cough: Secondary | ICD-10-CM | POA: Diagnosis not present

## 2016-01-02 DIAGNOSIS — I5022 Chronic systolic (congestive) heart failure: Secondary | ICD-10-CM | POA: Diagnosis not present

## 2016-01-02 LAB — RAPID URINE DRUG SCREEN, HOSP PERFORMED
Amphetamines: NOT DETECTED
BENZODIAZEPINES: NOT DETECTED
Barbiturates: NOT DETECTED
Cocaine: NOT DETECTED
Opiates: NOT DETECTED
Tetrahydrocannabinol: NOT DETECTED

## 2016-01-02 LAB — COMPREHENSIVE METABOLIC PANEL
ALK PHOS: 73 U/L (ref 38–126)
ALT: 13 U/L — AB (ref 14–54)
AST: 21 U/L (ref 15–41)
Albumin: 3.7 g/dL (ref 3.5–5.0)
Anion gap: 13 (ref 5–15)
BUN: 17 mg/dL (ref 6–20)
CALCIUM: 8.9 mg/dL (ref 8.9–10.3)
CO2: 29 mmol/L (ref 22–32)
CREATININE: 1.03 mg/dL — AB (ref 0.44–1.00)
Chloride: 97 mmol/L — ABNORMAL LOW (ref 101–111)
GFR calc non Af Amer: 52 mL/min — ABNORMAL LOW (ref >60)
GLUCOSE: 216 mg/dL — AB (ref 65–99)
Potassium: 3.7 mmol/L (ref 3.5–5.1)
SODIUM: 139 mmol/L (ref 135–145)
Total Bilirubin: 0.7 mg/dL (ref 0.3–1.2)
Total Protein: 7.4 g/dL (ref 6.5–8.1)

## 2016-01-02 LAB — GLUCOSE, CAPILLARY
GLUCOSE-CAPILLARY: 164 mg/dL — AB (ref 65–99)
GLUCOSE-CAPILLARY: 230 mg/dL — AB (ref 65–99)
GLUCOSE-CAPILLARY: 141 mg/dL — AB (ref 65–99)
Glucose-Capillary: 264 mg/dL — ABNORMAL HIGH (ref 65–99)

## 2016-01-02 LAB — AMMONIA: Ammonia: 26 umol/L (ref 9–35)

## 2016-01-02 LAB — BRAIN NATRIURETIC PEPTIDE: B Natriuretic Peptide: 71.8 pg/mL (ref 0.0–100.0)

## 2016-01-02 LAB — LACTIC ACID, PLASMA: LACTIC ACID, VENOUS: 2.3 mmol/L — AB (ref 0.5–2.0)

## 2016-01-02 LAB — PROCALCITONIN

## 2016-01-02 LAB — I-STAT CG4 LACTIC ACID, ED: LACTIC ACID, VENOUS: 2.3 mmol/L — AB (ref 0.5–2.0)

## 2016-01-02 MED ORDER — ISOSORBIDE MONONITRATE ER 30 MG PO TB24
15.0000 mg | ORAL_TABLET | Freq: Every day | ORAL | Status: DC
Start: 2016-01-02 — End: 2016-01-05
  Administered 2016-01-02 – 2016-01-05 (×4): 15 mg via ORAL
  Filled 2016-01-02 (×4): qty 1

## 2016-01-02 MED ORDER — BUDESONIDE 0.25 MG/2ML IN SUSP
0.2500 mg | Freq: Two times a day (BID) | RESPIRATORY_TRACT | Status: DC
  Administered 2016-01-02 – 2016-01-05 (×5): 0.25 mg via RESPIRATORY_TRACT
  Filled 2016-01-02 (×6): qty 2

## 2016-01-02 MED ORDER — MEMANTINE HCL ER 28 MG PO CP24
28.0000 mg | ORAL_CAPSULE | Freq: Every day | ORAL | Status: DC
  Administered 2016-01-02 – 2016-01-05 (×4): 28 mg via ORAL
  Filled 2016-01-02 (×4): qty 1

## 2016-01-02 MED ORDER — INSULIN ASPART 100 UNIT/ML ~~LOC~~ SOLN
0.0000 [IU] | Freq: Three times a day (TID) | SUBCUTANEOUS | Status: DC
  Administered 2016-01-02: 2 [IU] via SUBCUTANEOUS
  Administered 2016-01-02: 3 [IU] via SUBCUTANEOUS
  Administered 2016-01-03: 2 [IU] via SUBCUTANEOUS
  Administered 2016-01-03 (×2): 3 [IU] via SUBCUTANEOUS
  Administered 2016-01-04 (×2): 2 [IU] via SUBCUTANEOUS
  Administered 2016-01-04: 1 [IU] via SUBCUTANEOUS
  Administered 2016-01-05: 2 [IU] via SUBCUTANEOUS
  Administered 2016-01-05: 1 [IU] via SUBCUTANEOUS
  Administered 2016-01-05: 2 [IU] via SUBCUTANEOUS

## 2016-01-02 MED ORDER — PANTOPRAZOLE SODIUM 40 MG PO TBEC
40.0000 mg | DELAYED_RELEASE_TABLET | Freq: Every day | ORAL | Status: DC
  Administered 2016-01-02 – 2016-01-05 (×4): 40 mg via ORAL
  Filled 2016-01-02 (×4): qty 1

## 2016-01-02 MED ORDER — ALBUTEROL SULFATE (2.5 MG/3ML) 0.083% IN NEBU
2.5000 mg | INHALATION_SOLUTION | Freq: Four times a day (QID) | RESPIRATORY_TRACT | Status: DC
  Administered 2016-01-02 – 2016-01-05 (×11): 2.5 mg via RESPIRATORY_TRACT
  Filled 2016-01-02 (×11): qty 3

## 2016-01-02 MED ORDER — METOPROLOL SUCCINATE ER 25 MG PO TB24
25.0000 mg | ORAL_TABLET | Freq: Every day | ORAL | Status: DC
  Administered 2016-01-02 – 2016-01-05 (×4): 25 mg via ORAL
  Filled 2016-01-02 (×4): qty 1

## 2016-01-02 MED ORDER — PAROXETINE HCL 20 MG PO TABS
10.0000 mg | ORAL_TABLET | Freq: Every day | ORAL | Status: DC
  Administered 2016-01-02 – 2016-01-03 (×2): 10 mg via ORAL
  Filled 2016-01-02 (×2): qty 1

## 2016-01-02 MED ORDER — HYDROCODONE-ACETAMINOPHEN 5-325 MG PO TABS
1.0000 | ORAL_TABLET | ORAL | Status: DC | PRN
Start: 2016-01-02 — End: 2016-01-02

## 2016-01-02 MED ORDER — ASPIRIN EC 81 MG PO TBEC
81.0000 mg | DELAYED_RELEASE_TABLET | Freq: Every day | ORAL | Status: DC
  Administered 2016-01-02 – 2016-01-05 (×4): 81 mg via ORAL
  Filled 2016-01-02 (×4): qty 1

## 2016-01-02 MED ORDER — ONDANSETRON HCL 4 MG PO TABS: 4.0000 mg | ORAL_TABLET | Freq: Four times a day (QID) | ORAL | Status: DC | PRN

## 2016-01-02 MED ORDER — PRAVASTATIN SODIUM 40 MG PO TABS
40.0000 mg | ORAL_TABLET | Freq: Every day | ORAL | Status: DC
  Administered 2016-01-02 – 2016-01-05 (×4): 40 mg via ORAL
  Filled 2016-01-02 (×4): qty 1

## 2016-01-02 MED ORDER — IOHEXOL 300 MG/ML  SOLN
100.0000 mL | Freq: Once | INTRAMUSCULAR | Status: AC | PRN
  Administered 2016-01-02: 80 mL via INTRAVENOUS

## 2016-01-02 MED ORDER — HYDROMORPHONE HCL 1 MG/ML IJ SOLN: 0.5000 mg | INTRAMUSCULAR | Status: DC | PRN

## 2016-01-02 MED ORDER — LORAZEPAM 0.5 MG PO TABS
0.5000 mg | ORAL_TABLET | Freq: Two times a day (BID) | ORAL | Status: DC | PRN
  Administered 2016-01-02: 0.5 mg via ORAL
  Filled 2016-01-02: qty 1

## 2016-01-02 MED ORDER — ONDANSETRON HCL 4 MG/2ML IJ SOLN
4.0000 mg | Freq: Four times a day (QID) | INTRAMUSCULAR | Status: DC | PRN
  Administered 2016-01-03: 4 mg via INTRAVENOUS
  Filled 2016-01-02: qty 2

## 2016-01-02 MED ORDER — SODIUM CHLORIDE 0.9 % IV BOLUS (SEPSIS)
500.0000 mL | Freq: Once | INTRAVENOUS | Status: AC
  Administered 2016-01-02: 500 mL via INTRAVENOUS

## 2016-01-02 MED ORDER — ACETAMINOPHEN 650 MG RE SUPP: 650.0000 mg | Freq: Four times a day (QID) | RECTAL | Status: DC | PRN

## 2016-01-02 MED ORDER — SODIUM CHLORIDE 0.9 % IV SOLN
INTRAVENOUS | Status: DC
  Administered 2016-01-02 (×2): via INTRAVENOUS

## 2016-01-02 MED ORDER — ACETAMINOPHEN 325 MG PO TABS: 650.0000 mg | ORAL_TABLET | Freq: Four times a day (QID) | ORAL | Status: DC | PRN

## 2016-01-02 MED ORDER — FUROSEMIDE 20 MG PO TABS
20.0000 mg | ORAL_TABLET | Freq: Every day | ORAL | Status: DC
  Administered 2016-01-02 – 2016-01-05 (×4): 20 mg via ORAL
  Filled 2016-01-02 (×4): qty 1

## 2016-01-02 MED ORDER — HYDROMORPHONE HCL 1 MG/ML IJ SOLN: 0.2500 mg | INTRAMUSCULAR | Status: DC | PRN

## 2016-01-02 MED ORDER — ALBUTEROL SULFATE (2.5 MG/3ML) 0.083% IN NEBU: 2.5000 mg | INHALATION_SOLUTION | Freq: Four times a day (QID) | RESPIRATORY_TRACT | Status: DC

## 2016-01-02 MED ORDER — CLOPIDOGREL BISULFATE 75 MG PO TABS
75.0000 mg | ORAL_TABLET | Freq: Every day | ORAL | Status: DC
  Administered 2016-01-02 – 2016-01-05 (×4): 75 mg via ORAL
  Filled 2016-01-02 (×4): qty 1

## 2016-01-02 MED ORDER — SODIUM CHLORIDE 0.9 % IV BOLUS (SEPSIS)
1000.0000 mL | Freq: Once | INTRAVENOUS | Status: AC
  Administered 2016-01-02: 1000 mL via INTRAVENOUS

## 2016-01-02 MED ORDER — ENOXAPARIN SODIUM 30 MG/0.3ML ~~LOC~~ SOLN
30.0000 mg | SUBCUTANEOUS | Status: DC
  Administered 2016-01-02: 30 mg via SUBCUTANEOUS
  Filled 2016-01-02: qty 0.3

## 2016-01-02 MED ORDER — HYDROCODONE-ACETAMINOPHEN 5-325 MG PO TABS
1.0000 | ORAL_TABLET | ORAL | Status: DC | PRN
Start: 2016-01-02 — End: 2016-01-05
  Administered 2016-01-04: 1 via ORAL
  Filled 2016-01-02: qty 1

## 2016-01-02 MED ORDER — ALBUTEROL SULFATE (2.5 MG/3ML) 0.083% IN NEBU
INHALATION_SOLUTION | RESPIRATORY_TRACT | Status: AC
  Administered 2016-01-02: 2.5 mg via RESPIRATORY_TRACT
  Filled 2016-01-02: qty 3

## 2016-01-02 NOTE — ED Notes (Signed)
No bed in the room. Receiving floor to call when bed is available. Report given.

## 2016-01-02 NOTE — ED Notes (Signed)
Patient currently attempting to eat breakfast.

## 2016-01-02 NOTE — ED Notes (Signed)
Breakfast tray ordered, heart healthy and carb modified.

## 2016-01-02 NOTE — ED Notes (Signed)
Called dr. Alcario Drought, awaiting hospitalist for rounds.

## 2016-01-02 NOTE — ED Notes (Signed)
Dr. Rex Kras to update family on plan of care.

## 2016-01-02 NOTE — ED Notes (Signed)
Patient cleaned and transported to CT

## 2016-01-02 NOTE — Progress Notes (Signed)
Received report from ED RN.  However, no bed in room at the moment.  Will notified ED RN when bed is available.  Will report to oncoming day shift RN.

## 2016-01-02 NOTE — Progress Notes (Signed)
Admitted patient from E.D..Alert to self.Blanchable redness on her both gluteal fold otherwise no skin issues.Place in a position of comfort.Bed alarm set on middle sensitive alarm.Grandaughter at the bedside.

## 2016-01-02 NOTE — H&P (Signed)
[facility] History and Physical  [name] [id number] DOB: 10-Oct-1941 DOA: 01/01/2016  Referring physician:  PCP: Vidal Schwalbe, MD   Chief Complaint: Altered Mental Status  HPI: [name] is a 75 y.o. female with a history of CAD,ICM,T2 DM, dementia, sleep apnea, and other medical issues listed below, presenting to the emergency department with acute onset of confusion. Performing a report, she was having difficulties ambulating prior to these events, and some speech difficulty, Repaired gravely say "help", which quickly resolved.low grade fever, nausea vomiting or vertigo was reported by family. She did fall about 2 weeks ago, hitting the occipital area without losing consciousness, at which time she was seen at the emergency department, at which time a CT on 12/22/2015 was negative for acute intracranial findings or fractures.Grandaughter reports that the patient has been increasingly weaker, essentially bed-bound, unable to feed herself, feels that she may have forgotten how to eat, bathe, and other activities of daily living.The patient lives alone, and family is unable to assess the patient in the interim. At the ED, she had low-grade fever up to 99.6, she showed tachycardia, with elevated blood pressure. She was noted to have elevated lactate from 2.46, with repeat 2.3. UA neg for nitrite. CMET and CBC essentially unremarkable. CT head, CT abdomen without contrast negative for acute findings. CXR remarkable for small left pleural effusion. She is being admitted for management of symptoms.   Review of Systems  Unable to perform ROS: dementia    Past Medical History  Diagnosis Date  . CAD (coronary artery disease)     a. s/p inferior STEMI 12/29/10 with rotablator atherectomy RCA 12/31/10 and DES x 2 RCA;  b. Lexiscan Myoview (02/2015): mild reversible apical anterior perfusion defect. C. cath 02/2015 50% LAD lesion with patent stent, Tx Rx    . Ischemic  cardiomyopathy     EF 45%cath 2012 EF55% 5/12; 35% 11/14  . Diabetes mellitus     type 2  . HTN (hypertension)   . Hyperlipidemia   . Diverticulosis   . Respiratory arrest//ACE Inhibitor presumed cause   . COPD (chronic obstructive pulmonary disease) ([place])   . Small bowel obstruction (HCC)     from a sigmoid stricture  . Nephrolithiasis   . Sleep apnea   . Overweight(278.02)   . Osteoarthritis   . GERD (gastroesophageal reflux disease)   . Anxiety   . Glaucoma   . Memory deficit   . Dementia    Past Surgical History  Procedure Laterality Date  . Bilateral tubal ligation    . Lithotripsy    . 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 Cardiac surgery    . Cardiac catheterization N/A 03/18/2015    Procedure: LEFT HEART CATH AND CORONARY ANGIOGRAPHY;  Surgeon: Lorretta Harp, MD;  Location: [facility] CATH LAB;  Service: Cardiovascular;  Laterality: N/A;   Social History:  reports that she has been smoking Cigarettes.  She has a 25 pack-year smoking history. She has never used smokeless tobacco. She reports that she does not drink alcohol or use illicit drugs.  Allergies  Allergen Reactions  . Lisinopril Other (See Comments)    REACTION: respiratory arrest jan 2009  . Spironolactone Other (See Comments)    PATIENT HAD SEVERE SIDE EFFECTS  . Donepezil Diarrhea  . Eggs Or Egg-Derived Products Diarrhea  . Erythromycin Other (See Comments)    Makes me feel weird   . Macrolides And Ketolides Other (See Comments)  .  Penicillins Itching and Other (See Comments)    Too much as a kid  . Quinapril Hcl Itching and Other (See Comments)    Too much AS A KID  . Angiotensin Receptor Blockers Other (See Comments)    unknown  . Lipitor [Atorvastatin] Palpitations    FAST HEART RATE  . Pentazocine Lactate Other (See Comments)    Reaction unknown  . Propoxyphene Hcl Other (See Comments)    Reaction unknown  . Sulfonamide Derivatives Other (See Comments)     Childhood allergy  . Vancomycin Rash    CLOSES THROAT. ANY MYCIN FAMILY    Family History  Problem Relation Age of Onset  . Breast cancer Maternal Grandmother   . Diabetes Father   . Heart disease Father   . Diabetes Sister   . Colon cancer Neg Hx   . Heart attack Father   . Hypertension Sister   . Stroke Mother      Prior to Admission medications   Medication Sig Start Date End Date Taking? Authorizing Provider  aspirin EC 81 MG tablet Take 81 mg by mouth daily.   Yes Historical Provider, MD  clopidogrel (PLAVIX) 75 MG tablet TAKE 1 TABLET BY MOUTH EVERY DAY 11/11/14  Yes Burnell Blanks, MD  isosorbide mononitrate (IMDUR) 30 MG 24 hr tablet Take 0.5 tablets (15 mg total) by mouth daily. 10/25/15  Yes Isaiah Serge, NP  metFORMIN (GLUCOPHAGE) 500 MG tablet Take 2,000 mg by mouth daily before supper. Reported on 12/22/2015   Yes Historical Provider, MD  metoprolol succinate (TOPROL-XL) 25 MG 24 hr tablet Take 1 tablet (25 mg total) by mouth daily. 10/14/13  Yes Donita Brooks, NP  PARoxetine (PAXIL) 10 MG tablet Take 10 mg by mouth daily.   Yes Historical Provider, MD  pravastatin (PRAVACHOL) 40 MG tablet TAKE 1 TABLET (40 MG TOTAL) BY MOUTH DAILY. <PLEASE MAKE APPOINTMENT FOR REFILLS> 12/25/14  Yes Burnell Blanks, MD  albuterol (PROVENTIL HFA;VENTOLIN HFA) 108 (90 BASE) MCG/ACT inhaler Inhale 1-2 puffs into the lungs every 6 (six) hours as needed for wheezing.     Historical Provider, MD  albuterol (PROVENTIL) (2.5 MG/3ML) 0.083% nebulizer solution Take 3 mLs (2.5 mg total) by nebulization 4 (four) times daily. 07/22/15   Brand Males, MD  budesonide (PULMICORT) 0.25 MG/2ML nebulizer solution Take 2 mLs (0.25 mg total) by nebulization 2 (two) times daily. 07/22/15   Brand Males, MD  cefpodoxime (VANTIN) 200 MG tablet Take 1 tablet (200 mg total) by mouth 2 (two) times daily. 10/07/15   Thurnell Lose, MD  furosemide (LASIX) 20 MG tablet Take 20 mg by mouth daily.  EDEMA    Historical Provider, MD  insulin glargine (LANTUS) 100 UNIT/ML injection Inject 0.1 mLs (10 Units total) into the skin daily. 01/25/14   Hosie Poisson, MD  insulin lispro (HUMALOG) 100 UNIT/ML injection Inject 0-10 Units into the skin 3 (three) times daily before meals. Sliding scale: below 150=0 units, 150-200=2 units, 201-250=4 units, 251-300=6 units, 301-350=8 units, above 350=10 units    Historical Provider, MD  ipratropium (ATROVENT) 0.02 % nebulizer solution Take 2.5 mLs (0.5 mg total) by nebulization 4 (four) times daily. DX CODE J44.9 Patient taking differently: Take 0.5 mg by nebulization every 6 (six) hours as needed for wheezing.  07/22/15   Brand Males, MD  LORazepam (ATIVAN) 0.5 MG tablet Take 0.5 mg by mouth 2 (two) times daily as needed for anxiety.    Historical Provider, MD  mometasone-formoterol [facility]) 100-5  MCG/ACT AERO Inhale 2 puffs into the lungs 2 (two) times daily.    Historical Provider, MD  NAMENDA XR 28 MG CP24 24 hr capsule TAKE ONE CAPSULE BY MOUTH EVERY DAY 02/08/15   Ward Givens, NP  nitroGLYCERIN (NITROSTAT) 0.4 MG SL tablet Place 1 tablet (0.4 mg total) under the tongue every 5 (five) minutes as needed. For chest pain. 06/18/13   Burnell Blanks, MD  omeprazole (PRILOSEC) 20 MG capsule Take 20 mg by mouth as needed (REFLUX).    Historical Provider, MD  oxyCODONE-acetaminophen (PERCOCET/ROXICET) 5-325 MG tablet Take 1 tablet by mouth every 4 (four) hours as needed for moderate pain or severe pain.    Historical Provider, MD   Physical Exam: Filed Vitals:   01/02/16 0500 01/02/16 0600 01/02/16 0616 01/02/16 0630  BP: 153/76 126/77 157/77 161/85  Pulse: 75 76 72 77  Temp:      TempSrc:      Resp:   16   Height:      Weight:      SpO2: 94% 96% 96% 92%    Wt Readings from Last 3 Encounters:  01/01/16 71.215 kg (157 lb)  10/25/15 71.215 kg (157 lb)  10/07/15 67.903 kg (149 lb 11.2 oz)    Physical Exam  Constitutional: She is  well-developed, well-nourished, and in no distress. No distress.  HENT:  Head: Normocephalic.  Mouth/Throat: No oropharyngeal exudate.  Eyes: Pupils are equal, round, and reactive to light. No scleral icterus.  Neck: No JVD present. No tracheal deviation present. No thyromegaly present.  Cardiovascular: Normal rate, regular rhythm and normal heart sounds.   Pulmonary/Chest: No stridor. No respiratory distress. She has wheezes. She has no rales. She exhibits no tenderness.  Slightly decreased breath sounds on the left Poor inspiratory effort as she cannot follow commands   Abdominal: Soft. Bowel sounds are normal. She exhibits no mass. There is tenderness.  Lower quadrant tenderness on deep palpation  Musculoskeletal: Normal range of motion. She exhibits no edema or tenderness.  Lymphadenopathy:    She has no cervical adenopathy.  Neurological: She is alert. She exhibits normal muscle tone.  Cannot follow commands due to dementia, but is alert.  Skin: Skin is warm and dry. No rash noted. She is not diaphoretic. No erythema. No pallor.  Psychiatric: Mood and affect normal.  Patient has moderate dementia            Labs on Admission:  Basic Metabolic Panel:  Recent Labs Lab 01/01/16 2314  NA 139  K 3.7  CL 97*  CO2 29  GLUCOSE 216*  BUN 17  CREATININE 1.03*  CALCIUM 8.9    Liver Function Tests:  Recent Labs Lab 01/01/16 2314  AST 21  ALT 13*  ALKPHOS 73  BILITOT 0.7  PROT 7.4  ALBUMIN 3.7   No results for input(s): LIPASE, AMYLASE in the last 168 hours. No results for input(s): AMMONIA in the last 168 hours.  CBC:  Recent Labs Lab 01/01/16 2314  WBC 10.7*  HGB 14.9  HCT 44.0  MCV 94.8  PLT 195    Cardiac Enzymes: No results for input(s): CKTOTAL, CKMB, CKMBINDEX, TROPONINI in the last 168 hours.  BNP (last 3 results)  Recent Labs  03/31/15 0949 10/07/15 0015 01/02/16 0142  BNP 118.2* 23.9 71.8    ProBNP (last 3 results) No results for  input(s): PROBNP in the last 8760 hours.   CREATININE: 1.03 mg/dL ABNORMAL (01/01/16 2314) Estimated creatinine clearance - 45.3 mL/min  CBG:  Recent Labs Lab 01/01/16 2236 01/02/16 0824  GLUCAP 201* 264*    Radiological Exams on Admission: Dg Chest 2 View  01/01/2016  CLINICAL DATA:  Acute onset of altered mental status. Initial encounter. EXAM: CHEST  2 VIEW COMPARISON:  Chest radiograph performed 10/06/2015 FINDINGS: The lungs are well-aerated. A small left pleural effusion is noted. There is no evidence of focal opacification, pleural effusion or pneumothorax. The heart is borderline enlarged. No acute osseous abnormalities are seen. IMPRESSION: Small left pleural effusion noted. Lungs otherwise clear. Borderline cardiomegaly. Electronically Signed   By: Garald Balding M.D.   On: 01/01/2016 23:43   Ct Head Wo Contrast  01/02/2016  CLINICAL DATA:  Altered mental status, difficulty ambulating and communicating. History of dementia, hypertension, diabetes, hyperlipidemia. EXAM: CT HEAD WITHOUT CONTRAST TECHNIQUE: Contiguous axial images were obtained from the base of the skull through the vertex without intravenous contrast. COMPARISON:  CT head December 22, 2015 FINDINGS: Moderate ventriculomegaly on the basis of global parenchymal brain volume loss, advanced for age. No intraparenchymal hemorrhage, mass effect nor midline shift. Patchy to confluent supratentorial white matter hypodensities. No acute large vascular territory infarcts. 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 No abnormal extra-axial fluid collections. Basal cisterns are patent. Moderate calcific atherosclerosis of the carotid siphons. No skull fracture. The included ocular globes and orbital contents are non-suspicious. Mild paranasal sinus mucosal thickening without air-fluid levels. The mastoid air cells are well aerated. Moderate LEFT temporomandibular osteoarthrosis. IMPRESSION: No acute intracranial process.  Stable appearance of the head including moderate global brain atrophy, old RIGHT basal ganglia and thalamus lacunar infarcts. Electronically Signed   By: Elon Alas M.D.   On: 01/02/2016 01:50   Ct Abdomen Pelvis W Contrast  01/02/2016  CLINICAL DATA:  75 year old female with right upper quadrant abdominal pain as well as left lower quadrant pain. EXAM: CT ABDOMEN AND PELVIS WITH CONTRAST TECHNIQUE: Multidetector CT imaging of the abdomen and pelvis was performed using the standard protocol following bolus administration of intravenous contrast. CONTRAST:  87mL OMNIPAQUE IOHEXOL 300 MG/ML  SOLN COMPARISON:  CT dated 05/06/2015 FINDINGS: Evaluation of this exam is limited due to respiratory motion artifact. The visualized lung bases are clear. There is coronary vascular calcification. No intra-abdominal free air or free fluid. Small focal density at the gallbladder fundus may represent a focal adenomyomatosis. A noncalcified stone is less likely not excluded. Ultrasound may provide better evaluation the gallbladder if clinically indicated. There is no pericholecystic fluid. The liver, pancreas, spleen, and adrenal glands appear unremarkable. There is no hydronephrosis or nephrolithiasis on either side. Subcentimeter right renal hypodense lesion is not well characterized but may represent a cyst. The visualized ureters and urinary bladder appear unremarkable. The uterus and ovaries are grossly unremarkable. Evaluation of the bowel is limited in the absence of oral contrast. There is partial sigmoid resection with anastomotic sutures. There is no evidence of bowel obstruction or inflammation. The appendix is not visualized with certainty. No inflammatory changes identified in the right lower quadrant. Advanced aortoiliac atherosclerotic disease. There is ectasia of the infrarenal abdominal aorta. No portal venous gas identified. There is no adenopathy. The abdominal wall soft tissues appear unremarkable.  There is osteopenia with degenerative changes of the spine. Old-appearing compression deformity of the superior endplate of the L5 vertebra No acute fracture. IMPRESSION: No definite acute intra-abdominal or pelvic pathology. Electronically Signed   By: Anner Crete M.D.   On: 01/02/2016 02:01    Assessment/Plan Principal Problem:  Lactic acidosis Active Problems:   DM2 (diabetes mellitus, type 2) (HCC)   Dyslipidemia   Memory deficit   Chronic cough   Chronic systolic heart failure (HCC)   COPD (chronic obstructive pulmonary disease) (HCC)   Senile dementia without behavioral disturbance   Encephalopathy acute   Acute encephalopathy   Acute on Chronic Confusional State  DDX may include  hypoglycemia, vs drug induced, versus infection, vs metabolic. CT head is negative for acute intracranial abnormalities  Admit to med-surg A1C Blood and Urine cultures pending. Check ammonia levels IVF with NS Wil hold antibiotics for now, although this may change pending on the cultures  Diabetes mellitus type 2 insulin dependent, last Hb A1C 6.0 on 01/2014 Current sugar levels 216 -  A1c -  Hold oral medications -  SSI  Chronic systolic heart failure, last echocardiogram 03/17/15 with moderate LVH, ejection fraction 45%  No recent EKG. Stable at this time - Daily weights, strict I/O Continue home meds including  ASA, Plavix, Toprol, Lasix    Dysplipidemia  Continue Zocor  Hypertension, currently controlled with meds. BP 161/85 mmHg  Pulse 77  Continue ASA, Plavix, Toprol, Lasix, Imdur    COPD, stable, no acute respiratory issues. CXR remarkable for small left pleural effusion. No acute clinical findings.  - O2 prn Continue albuterol and pulmicort nebs   Dysphagia In the setting of advanced dementia. Patient is at risk of aspirating. We will request swallow evaluation stat prior to deciding on diet. She may benefit from nutrition evaluation thereafter.   Social issues Patient  lives alone, family can only provide limited care for her. She spends many hours alone, in the dangerous environment due to her mother's with advanced dementia. Cannot feed herself, at the risk of aspirating and harming herself during ambulation. We will request social work consultation to help arrange care after her discharge, which may include SNF pplacement  Code Status:  DNR. Needs to be readdressed but POA not present at this time, and patient unable to verbalize due to dementia DVT Prophylaxis: Lovenox Family Communication: grandaughter at bedside Disposition Plan: Pending Improvement. Admitted for observation in tele bed. Expected LOS 24-48 hrs    [facility] E,PA-C [facility] [email or web address] Password [email or web address]

## 2016-01-02 NOTE — Evaluation (Signed)
Clinical/Bedside Swallow Evaluation Patient Details  Name: [name] MRN: [id number] Date of Birth: Apr 12, 1941  Today's Date: 01/02/2016 Time: SLP Start Time (ACUTE ONLY): 1337 SLP Stop Time (ACUTE ONLY): 1359 SLP Time Calculation (min) (ACUTE ONLY): 22 min  Past Medical History:  Past Medical History  Diagnosis Date  . CAD (coronary artery disease)     a. s/p inferior STEMI 12/29/10 with rotablator atherectomy RCA 12/31/10 and DES x 2 RCA;  b. Lexiscan Myoview (02/2015): mild reversible apical anterior perfusion defect. C. cath 02/2015 50% LAD lesion with patent stent, Tx Rx    . Ischemic cardiomyopathy     EF 45%cath 2012 EF55% 5/12; 35% 11/14  . Diabetes mellitus     type 2  . HTN (hypertension)   . Hyperlipidemia   . Diverticulosis   . Respiratory arrest//ACE Inhibitor presumed cause   . COPD (chronic obstructive pulmonary disease) ([place])   . Small bowel obstruction (HCC)     from a sigmoid stricture  . Nephrolithiasis   . Sleep apnea   . Overweight(278.02)   . Osteoarthritis   . GERD (gastroesophageal reflux disease)   . Anxiety   . Glaucoma   . Memory deficit   . Dementia    Past Surgical History:  Past Surgical History  Procedure Laterality Date  . Bilateral tubal ligation    . Lithotripsy    . 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 Cardiac surgery    . Cardiac catheterization N/A 03/18/2015    Procedure: LEFT HEART CATH AND CORONARY ANGIOGRAPHY;  Surgeon: Lorretta Harp, MD;  Location: [facility] CATH LAB;  Service: Cardiovascular;  Laterality: N/A;   HPI:  75 y.o. female with a history of CAD, DM, dementia, GERD, sleep recent fall, apnea presented to the emergency department with acute onset of confusion and gradual decline in performing ADL's. CXR  remarkable for small left pleural effusion. UA without signs of infection. Head CT negative. No prior ST notes.   Assessment / Plan / Recommendation Clinical Impression  Family present  and describe typical dementia like behaviors during meals such as impulsivity, multiple bites prior to swallow, pocketing, oral holding and coughing during meals. No s/s aspiration present with controlled single cup and straw sips thin, small bites puree and solid. Verbal education given to daughter and granddaughter (aids in pt's care at home) regarding strategies to mitigate aspiration risk including allowing pt to feed herself but providing tactile cues to slow rate, limiting distractions, verbal cues and redirection, proper positioning, pills in puree if needed in the future and diet modification. Educated family on nature of dysphagia as dementia progresses and possibility of NPO recommendation some day, alternate meals vs comfort feedings. They appeared to comprehend. Recommend Dys 3 texture, thin liquids, straws allowed (small sips), full assist due to cognitive deficits, pills with liquid (family states no difficulty prior). ST will follow up.         Aspiration Risk  Moderate aspiration risk    Diet Recommendation Dysphagia 3 (Mech soft);Thin liquid   Liquid Administration via: Cup;Straw Medication Administration: Whole meds with liquid Supervision: Patient able to self feed;Full supervision/cueing for compensatory strategies Compensations: Minimize environmental distractions;Slow rate;Small sips/bites Postural Changes: Seated upright at 90 degrees    Other  Recommendations Oral Care Recommendations: Oral care BID   Follow up Recommendations  Skilled Nursing facility    Frequency and Duration min 2x/week          Prognosis Prognosis for Safe  Diet Advancement:  (fair-good) Barriers to Reach Goals: Cognitive deficits      Swallow Study   General HPI: 75 y.o. female with a history of CAD, DM, dementia, GERD, sleep recent fall, apnea presented to the emergency department with acute onset of confusion and gradual decline in performing ADL's. CXR  remarkable for small left pleural  effusion. UA without signs of infection. Head CT negative. No prior ST notes. Type of Study: Bedside Swallow Evaluation Previous Swallow Assessment:  (none) Diet Prior to this Study: NPO Temperature Spikes Noted: No Respiratory Status: Room air History of Recent Intubation: No Behavior/Cognition: Alert;Cooperative;Pleasant mood;Requires cueing Oral Cavity Assessment: Within Functional Limits Oral Care Completed by SLP: No Oral Cavity - Dentition: Dentures, top (missing some lower) Vision: Functional for self-feeding Self-Feeding Abilities: Able to feed self;Needs set up Patient Positioning: Upright in bed Baseline Vocal Quality: Normal Volitional Cough: Strong Volitional Swallow: Able to elicit    Oral/Motor/Sensory Function Overall Oral Motor/Sensory Function: Within functional limits   Ice Chips Ice chips: Not tested   Thin Liquid Thin Liquid: Within functional limits Presentation: Cup;Straw    Nectar Thick Nectar Thick Liquid: Not tested   Honey Thick Honey Thick Liquid: Not tested   Puree Puree: Within functional limits   Solid   GO   Solid: Within functional limits    Functional Assessment Tool Used: skilled clinical judgement Functional Limitations: Swallowing Swallow Current Status [id number]): At least 1 percent but less than 20 percent impaired, limited or restricted Swallow Goal Status [phone]): At least 1 percent but less than 20 percent impaired, limited or restricted Swallow Discharge Status [phone]): At least 1 percent but less than 20 percent impaired, limited or restricted   Houston Siren 01/02/2016,2:30 PM [phone]

## 2016-01-03 DIAGNOSIS — E119 Type 2 diabetes mellitus without complications: Secondary | ICD-10-CM

## 2016-01-03 DIAGNOSIS — I2510 Atherosclerotic heart disease of native coronary artery without angina pectoris: Secondary | ICD-10-CM | POA: Diagnosis not present

## 2016-01-03 DIAGNOSIS — E872 Acidosis: Secondary | ICD-10-CM | POA: Diagnosis not present

## 2016-01-03 DIAGNOSIS — I5022 Chronic systolic (congestive) heart failure: Secondary | ICD-10-CM | POA: Diagnosis not present

## 2016-01-03 DIAGNOSIS — R4182 Altered mental status, unspecified: Secondary | ICD-10-CM | POA: Diagnosis not present

## 2016-01-03 DIAGNOSIS — F0390 Unspecified dementia without behavioral disturbance: Secondary | ICD-10-CM

## 2016-01-03 DIAGNOSIS — R410 Disorientation, unspecified: Secondary | ICD-10-CM

## 2016-01-03 DIAGNOSIS — R0682 Tachypnea, not elsewhere classified: Secondary | ICD-10-CM | POA: Diagnosis not present

## 2016-01-03 LAB — BASIC METABOLIC PANEL
ANION GAP: 10 (ref 5–15)
BUN: 10 mg/dL (ref 6–20)
CALCIUM: 8.5 mg/dL — AB (ref 8.9–10.3)
CO2: 25 mmol/L (ref 22–32)
Chloride: 104 mmol/L (ref 101–111)
Creatinine, Ser: 0.85 mg/dL (ref 0.44–1.00)
GFR calc Af Amer: >60 mL/min (ref >60)
GFR calc non Af Amer: >60 mL/min (ref >60)
GLUCOSE: 257 mg/dL — AB (ref 65–99)
POTASSIUM: 3 mmol/L — AB (ref 3.5–5.1)
Sodium: 139 mmol/L (ref 135–145)

## 2016-01-03 LAB — CBC WITH DIFFERENTIAL/PLATELET
BASOS ABS: 0 10*3/uL (ref 0.0–0.1)
Basophils Relative: 1 %
Eosinophils Absolute: 0.1 10*3/uL (ref 0.0–0.7)
Eosinophils Relative: 1 %
HEMATOCRIT: 42.6 % (ref 36.0–46.0)
Hemoglobin: 14.1 g/dL (ref 12.0–15.0)
LYMPHS ABS: 1.7 10*3/uL (ref 0.7–4.0)
LYMPHS PCT: 22 %
MCH: 31 pg (ref 26.0–34.0)
MCHC: 33.1 g/dL (ref 30.0–36.0)
MCV: 93.6 fL (ref 78.0–100.0)
Monocytes Absolute: 0.5 10*3/uL (ref 0.1–1.0)
Monocytes Relative: 7 %
NEUTROS ABS: 5.6 10*3/uL (ref 1.7–7.7)
Neutrophils Relative %: 69 %
Platelets: 159 10*3/uL (ref 150–400)
RBC: 4.55 MIL/uL (ref 3.87–5.11)
RDW: 12.8 % (ref 11.5–15.5)
WBC: 8 10*3/uL (ref 4.0–10.5)

## 2016-01-03 LAB — GLUCOSE, CAPILLARY
GLUCOSE-CAPILLARY: 165 mg/dL — AB (ref 65–99)
Glucose-Capillary: 216 mg/dL — ABNORMAL HIGH (ref 65–99)
Glucose-Capillary: 232 mg/dL — ABNORMAL HIGH (ref 65–99)
Glucose-Capillary: 172 mg/dL — ABNORMAL HIGH (ref 65–99)

## 2016-01-03 LAB — HEMOGLOBIN A1C
HEMOGLOBIN A1C: 7.4 % — AB (ref 4.8–5.6)
Mean Plasma Glucose: 166 mg/dL

## 2016-01-03 MED ORDER — HALOPERIDOL LACTATE 5 MG/ML IJ SOLN
INTRAMUSCULAR | Status: AC
  Filled 2016-01-03: qty 1

## 2016-01-03 MED ORDER — LORAZEPAM 2 MG/ML IJ SOLN: 1.0000 mg | Freq: Four times a day (QID) | INTRAMUSCULAR | Status: DC | PRN

## 2016-01-03 MED ORDER — HALOPERIDOL LACTATE 5 MG/ML IJ SOLN
2.0000 mg | Freq: Four times a day (QID) | INTRAMUSCULAR | Status: DC | PRN
  Administered 2016-01-03: 2 mg via INTRAVENOUS
  Filled 2016-01-03: qty 1

## 2016-01-03 MED ORDER — HALOPERIDOL LACTATE 5 MG/ML IJ SOLN: 2.0000 mg | Freq: Four times a day (QID) | INTRAMUSCULAR | Status: DC | PRN

## 2016-01-03 MED ORDER — ARIPIPRAZOLE 2 MG PO TABS
2.0000 mg | ORAL_TABLET | Freq: Two times a day (BID) | ORAL | Status: DC
  Administered 2016-01-03 – 2016-01-05 (×4): 2 mg via ORAL
  Filled 2016-01-03 (×4): qty 1

## 2016-01-03 MED ORDER — HALOPERIDOL LACTATE 5 MG/ML IJ SOLN: 2.0000 mg | Freq: Once | INTRAMUSCULAR | Status: DC

## 2016-01-03 MED ORDER — MAGNESIUM SULFATE 2 GM/50ML IV SOLN
2.0000 g | Freq: Once | INTRAVENOUS | Status: AC
  Administered 2016-01-03: 2 g via INTRAVENOUS
  Filled 2016-01-03: qty 50

## 2016-01-03 MED ORDER — ENOXAPARIN SODIUM 40 MG/0.4ML ~~LOC~~ SOLN
40.0000 mg | SUBCUTANEOUS | Status: DC
  Administered 2016-01-04 – 2016-01-05 (×2): 40 mg via SUBCUTANEOUS
  Filled 2016-01-03 (×2): qty 0.4

## 2016-01-03 MED ORDER — DIVALPROEX SODIUM 125 MG PO CSDR
125.0000 mg | DELAYED_RELEASE_CAPSULE | Freq: Two times a day (BID) | ORAL | Status: DC
  Administered 2016-01-03: 125 mg via ORAL
  Filled 2016-01-03 (×2): qty 1

## 2016-01-03 MED ORDER — INSULIN GLARGINE 100 UNIT/ML ~~LOC~~ SOLN
8.0000 [IU] | Freq: Every day | SUBCUTANEOUS | Status: DC
  Administered 2016-01-03 – 2016-01-04 (×2): 8 [IU] via SUBCUTANEOUS
  Filled 2016-01-03 (×3): qty 0.08

## 2016-01-03 MED ORDER — HALOPERIDOL LACTATE 5 MG/ML IJ SOLN
2.0000 mg | Freq: Once | INTRAMUSCULAR | Status: DC
  Administered 2016-01-03: 2 mg via INTRAVENOUS

## 2016-01-03 MED ORDER — POTASSIUM CHLORIDE CRYS ER 20 MEQ PO TBCR
40.0000 meq | EXTENDED_RELEASE_TABLET | Freq: Two times a day (BID) | ORAL | Status: AC
  Administered 2016-01-03 (×2): 40 meq via ORAL
  Filled 2016-01-03 (×2): qty 2

## 2016-01-03 MED ORDER — POTASSIUM CHLORIDE CRYS ER 20 MEQ PO TBCR: 40.0000 meq | EXTENDED_RELEASE_TABLET | Freq: Once | ORAL | Status: DC

## 2016-01-03 MED ORDER — MIRTAZAPINE 15 MG PO TBDP
15.0000 mg | ORAL_TABLET | Freq: Every day | ORAL | Status: DC
  Administered 2016-01-03 – 2016-01-04 (×2): 15 mg via ORAL
  Filled 2016-01-03 (×4): qty 1

## 2016-01-03 NOTE — Clinical Social Work Note (Signed)
Clinical Social Work Assessment  Patient Details  Name: [name] MRN: [id number] Date of Birth: 03/31/1941  Date of referral:  01/03/16               Reason for consult:  Facility Placement                Permission sought to share information with:  Chartered certified accountant granted to share information::  Yes, Verbal Permission Granted  Name::        Agency::     Relationship::     Contact Information:     Housing/Transportation Living arrangements for the past 2 months:  Single Family Home Source of Information:  Adult Children Patient Interpreter Needed:  None Criminal Activity/Legal Involvement Pertinent to Current Situation/Hospitalization:  No - Comment as needed Significant Relationships:  Adult Children (granddaughter) Lives with:  Self (pt lives alone with intermittent family care) Do you feel safe going back to the place where you live?  No Need for family participation in patient care:  No (Coment)  Care giving concerns: CSW spoke to pt's daughter, Ms. [name] by telephone re role of CSW/discharge planning.  Per daughter, pt lives alone and does not have 24 hr care available to her.  She is unable to care for herself and needs to be placed in a NH.  Social Worker assessment / plan:  CSW to begin bed search and facilitate insurance authorization and NH tx as appropriate. Employment status:  Retired Nurse, adult PT Recommendations:  [place] / Referral to community resources:  [place]  Patient/Family's Response to care: Agreeable to NHP for pt and relieved that the hospital wasn't going to make family take pt home.  Appreciative of CSW phone call and assistance with placement. Patient/Family's Understanding of and Emotional Response to Diagnosis, Current Treatment, and Prognosis: Daughter realizes that pt has a progressive disease and will need LTC placement to ensure proper care  in a safe environment. Emotional Assessment Appearance:  Other (Comment Required (unk. spoke with daughter on the phone) Attitude/Demeanor/Rapport:  Unable to Assess Affect (typically observed):  Unable to Assess Orientation:    Alcohol / Substance use:  Not Applicable Psych involvement (Current and /or in the community):  Yes (Comment)  Discharge Needs  Concerns to be addressed:  Discharge Planning Concerns Readmission within the last 30 days:  No Current discharge risk:  Lack of support system, Lives alone, Cognitively Impaired, Dependent with Mobility Barriers to Discharge:  Insurance Authorization   Roanna Raider, Marlinda Mike 01/03/2016, 7:40 PM
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
## 2016-01-03 NOTE — Progress Notes (Signed)
PATIENT DETAILS Name: [name] Age: 75 y.o. Sex: female Date of Birth: [birth year] Admit Date: 01/01/2016 Admitting Physician Etta Quill, DO [id number] S, MD  Subjective: Presently confused-calm-eating breakfast this morning when I saw her.  Assessment/Plan: Delirium: Suspect related to dementia. No evidence of infection at this point. CT head negative for acute abnormalities. QTC second prolonged, start Depakote. Continue Celexa. Have consulted psychiatry for assistance with medication optimization  Dementia:  Chronic issue, now complicated by delirium.  Continue Namenda and Paxil.   Hypokalemia: Replete and recheck.   History of CAD:  Appears stable without chest pain or shortness of breath. Continue aspirin, Plavix, metoprolol, Imdur and statin.  Chronic systolic heart failure: Clinically euvolemic. Continue Lasix and metoprolol. Last EF by TTE 2016 was around 45%.  Hypertension: BP controlled, continue Imdur, metoprolol, Lasix. Follow  Type 2 diabetes: Continue to hold metformin. CBGs on the higher side, start 8 units of Lantus and follow.  GERD: Continue PPI   Disposition: Remain inpatient  Antimicrobial agents  See below  Anti-infectives    None      DVT Prophylaxis: Prophylactic Lovenox   Code Status:  DNR-reconfirmed with patient's daughter [name] over the phone   Family Communication daughter [name] over the phone   Procedures: None  CONSULTS:  psychiatry  Time spent 30 minutes-Greater than 50% of this time was spent in counseling, explanation of diagnosis, planning of further management, and coordination of care.  MEDICATIONS: Scheduled Meds: . albuterol  2.5 mg Nebulization QID  . aspirin EC  81 mg Oral Daily  . budesonide  0.25 mg Nebulization BID  . clopidogrel  75 mg Oral Daily  . divalproex  125 mg Oral Q12H  . enoxaparin (LOVENOX) injection  30 mg Subcutaneous Q24H  . furosemide  20  mg Oral Daily  . insulin aspart  0-9 Units Subcutaneous TID WC  . isosorbide mononitrate  15 mg Oral Daily  . magnesium sulfate 1 - 4 g bolus IVPB  2 g Intravenous Once  . memantine  28 mg Oral Daily  . metoprolol succinate  25 mg Oral Daily  . pantoprazole  40 mg Oral Daily  . PARoxetine  10 mg Oral Daily  . potassium chloride  40 mEq Oral BID  . pravastatin  40 mg Oral q1800   Continuous Infusions:  PRN Meds:.acetaminophen **OR** acetaminophen, HYDROcodone-acetaminophen, HYDROmorphone (DILAUDID) injection, LORazepam, ondansetron **OR** ondansetron (ZOFRAN) IV    PHYSICAL EXAM: Vital signs in last 24 hours: Filed Vitals:   01/02/16 1723 01/02/16 2207 01/02/16 2221 01/03/16 0639  BP: 134/64  158/50 130/68  Pulse: 74  62 64  Temp: 98 F (36.7 C)  98 F (36.7 C)   TempSrc: Oral     Resp: 18  21 22   Height:      Weight:      SpO2: 98% 97% 95% 94%    Weight change: -5.915 kg (-13 lb 0.6 oz) Filed Weights   01/01/16 2219 01/02/16 0830  Weight: 71.215 kg (157 lb) 65.3 kg (143 lb 15.4 oz)   Body mass index is 25.51 kg/(m^2).   Gen Exam: Awake- presently confused. Has clear speech.    Neck: Supple, No JVD.   Chest: B/L Clear.   CVS: S1 S2 Regular Abdomen: soft, BS +, non tender, non distended.  Extremities: no edema, lower extremities warm to touch Neurologic: Non Focal.   Skin: No Rash.  Wounds: N/A.    Intake/Output from previous day:  Intake/Output Summary (Last 24 hours) at 01/03/16 1314 Last data filed at 01/03/16 0600  Gross per 24 hour  Intake    240 ml  Output    275 ml  Net    -35 ml     LAB RESULTS: CBC  Recent Labs Lab 01/01/16 2314 01/03/16 0524  WBC 10.7* 8.0  HGB 14.9 14.1  HCT 44.0 42.6  PLT 195 159  MCV 94.8 93.6  MCH 32.1 31.0  MCHC 33.9 33.1  RDW 12.9 12.8  LYMPHSABS  --  1.7  MONOABS  --  0.5  EOSABS  --  0.1  BASOSABS  --  0.0    Chemistries   Recent Labs Lab 01/01/16 2314 01/03/16 0524  NA 139 139  K 3.7 3.0*  CL  97* 104  CO2 29 25  GLUCOSE 216* 257*  BUN 17 10  CREATININE 1.03* 0.85  CALCIUM 8.9 8.5*    CBG:  Recent Labs Lab 01/02/16 1330 01/02/16 1612 01/02/16 2239 01/03/16 0754 01/03/16 1133  GLUCAP 164* 230* 141* 216* 232*    GFR Estimated Creatinine Clearance: 52.8 mL/min (by C-G formula based on Cr of 0.85).  Coagulation profile No results for input(s): INR, PROTIME in the last 168 hours.  Cardiac Enzymes No results for input(s): CKMB, TROPONINI, MYOGLOBIN in the last 168 hours.  Invalid input(s): CK  Invalid input(s): POCBNP No results for input(s): DDIMER in the last 72 hours.  Recent Labs  01/02/16 1101  HGBA1C 7.4*   No results for input(s): CHOL, HDL, LDLCALC, TRIG, CHOLHDL, LDLDIRECT in the last 72 hours. No results for input(s): TSH, T4TOTAL, T3FREE, THYROIDAB in the last 72 hours.  Invalid input(s): FREET3 No results for input(s): VITAMINB12, FOLATE, FERRITIN, TIBC, IRON, RETICCTPCT in the last 72 hours. No results for input(s): LIPASE, AMYLASE in the last 72 hours.  Urine Studies No results for input(s): UHGB, CRYS in the last 72 hours.  Invalid input(s): UACOL, UAPR, USPG, UPH, UTP, UGL, UKET, UBIL, UNIT, UROB, ULEU, UEPI, UWBC, URBC, UBAC, CAST, UCOM, BILUA  MICROBIOLOGY: No results found for this or any previous visit (from the past 240 hour(s)).  RADIOLOGY STUDIES/RESULTS: Dg Chest 2 View  01/01/2016  CLINICAL DATA:  Acute onset of altered mental status. Initial encounter. EXAM: CHEST  2 VIEW COMPARISON:  Chest radiograph performed 10/06/2015 FINDINGS: The lungs are well-aerated. A small left pleural effusion is noted. There is no evidence of focal opacification, pleural effusion or pneumothorax. The heart is borderline enlarged. No acute osseous abnormalities are seen. IMPRESSION: Small left pleural effusion noted. Lungs otherwise clear. Borderline cardiomegaly. Electronically Signed   By: Garald Balding M.D.   On: 01/01/2016 23:43   Dg Thoracic  Spine 2 View  01/02/2016  CLINICAL DATA:  75 year old female with thoracic spine pain. EXAM: THORACIC SPINE 2 VIEWS COMPARISON:  01/01/2016 and prior exams. FINDINGS: There is no evidence of acute fracture subluxation. Mild degenerative disc disease and spondylosis again noted. No focal bony lesions are present. A left pleural effusion is again identified. IMPRESSION: No evidence of acute thoracic spine abnormality Electronically Signed   By: Margarette Canada M.D.   On: 01/02/2016 15:09   Dg Lumbar Spine 2-3 Views  01/02/2016  CLINICAL DATA:  Low back pain. EXAM: LUMBAR SPINE - 2-3 VIEW COMPARISON:  CT abdomen and pelvis from the same day. FINDINGS: Five non rib-bearing lumbar type vertebral bodies are present. A remote superior endplate compression fracture of L5  is stable. Extensive atherosclerotic changes in the aorta are again noted. IMPRESSION: 1. No acute abnormality. 2. Remote superior endplate compression fracture at L5. 3. Atherosclerosis. Electronically Signed   By: San Morelle M.D.   On: 01/02/2016 15:14   Ct Head Wo Contrast  01/02/2016  CLINICAL DATA:  Altered mental status, difficulty ambulating and communicating. History of dementia, hypertension, diabetes, hyperlipidemia. EXAM: CT HEAD WITHOUT CONTRAST TECHNIQUE: Contiguous axial images were obtained from the base of the skull through the vertex without intravenous contrast. COMPARISON:  CT head December 22, 2015 FINDINGS: Moderate ventriculomegaly on the basis of global parenchymal brain volume loss, advanced for age. No intraparenchymal hemorrhage, mass effect nor midline shift. Patchy to confluent supratentorial white matter hypodensities. No acute large vascular territory infarcts. 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 No abnormal extra-axial fluid collections. Basal cisterns are patent. Moderate calcific atherosclerosis of the carotid siphons. No skull fracture. The included ocular globes and orbital contents are  non-suspicious. Mild paranasal sinus mucosal thickening without air-fluid levels. The mastoid air cells are well aerated. Moderate LEFT temporomandibular osteoarthrosis. IMPRESSION: No acute intracranial process. Stable appearance of the head including moderate global brain atrophy, old RIGHT basal ganglia and thalamus lacunar infarcts. Electronically Signed   By: Elon Alas M.D.   On: 01/02/2016 01:50   Ct Head Wo Contrast  12/22/2015  CLINICAL DATA:  Fall getting out of bed with posterior head laceration. Initial encounter. EXAM: CT HEAD WITHOUT CONTRAST TECHNIQUE: Contiguous axial images were obtained from the base of the skull through the vertex without intravenous contrast. COMPARISON:  05/06/2014 FINDINGS: Skull and Sinuses: Occipital scalp contusion. Negative for fracture. Hypodense secretions layer within left maxillary sinus. Visualized orbits: Negative. Brain: No evidence of acute infarction, hemorrhage, hydrocephalus, or mass lesion/mass effect. Stable pattern of patchy low-density in the cerebral white matter consistent with chronic microvascular ischemia. There have been remote lacunar infarcts in the right thalamus and deep white matter tracts. Cerebral volume loss with ex vacuo ventricular enlargement, mildly progressed since 2015. IMPRESSION: 1. No acute intracranial finding or fracture. 2. Left maxillary sinus fluid level. 3. Atrophy and chronic small vessel disease. Electronically Signed   By: Monte Fantasia M.D.   On: 12/22/2015 05:33   Ct Abdomen Pelvis W Contrast  01/02/2016  CLINICAL DATA:  75 year old female with right upper quadrant abdominal pain as well as left lower quadrant pain. EXAM: CT ABDOMEN AND PELVIS WITH CONTRAST TECHNIQUE: Multidetector CT imaging of the abdomen and pelvis was performed using the standard protocol following bolus administration of intravenous contrast. CONTRAST:  18mL OMNIPAQUE IOHEXOL 300 MG/ML  SOLN COMPARISON:  CT dated 05/06/2015 FINDINGS:  Evaluation of this exam is limited due to respiratory motion artifact. The visualized lung bases are clear. There is coronary vascular calcification. No intra-abdominal free air or free fluid. Small focal density at the gallbladder fundus may represent a focal adenomyomatosis. A noncalcified stone is less likely not excluded. Ultrasound may provide better evaluation the gallbladder if clinically indicated. There is no pericholecystic fluid. The liver, pancreas, spleen, and adrenal glands appear unremarkable. There is no hydronephrosis or nephrolithiasis on either side. Subcentimeter right renal hypodense lesion is not well characterized but may represent a cyst. The visualized ureters and urinary bladder appear unremarkable. The uterus and ovaries are grossly unremarkable. Evaluation of the bowel is limited in the absence of oral contrast. There is partial sigmoid resection with anastomotic sutures. There is no evidence of bowel obstruction or inflammation. The appendix is not visualized with  certainty. No inflammatory changes identified in the right lower quadrant. Advanced aortoiliac atherosclerotic disease. There is ectasia of the infrarenal abdominal aorta. No portal venous gas identified. There is no adenopathy. The abdominal wall soft tissues appear unremarkable. There is osteopenia with degenerative changes of the spine. Old-appearing compression deformity of the superior endplate of the L5 vertebra No acute fracture. IMPRESSION: No definite acute intra-abdominal or pelvic pathology. Electronically Signed   By: Anner Crete M.D.   On: 01/02/2016 Brien Mates, MD  [facility] Pager:[phone]  If 7PM-7AM, please contact night-coverage [email or web address] Password [email or web address] 01/03/2016, 1:14 PM

## 2016-01-03 NOTE — Progress Notes (Signed)
Speech Language Pathology Treatment: Dysphagia  Patient Details Name: [name] MRN: [id number] DOB: 05/26/1941 Today's Date: 01/03/2016 Time: 1448-1500 SLP Time Calculation (min) (ACUTE ONLY): 12 min  Assessment / Plan / Recommendation Clinical Impression  Pt sitting up in chair with family present. Family member reported pt coughing yesterday consistently when using straws. Pt required encouragment to drink/eat however consumed thin via cup without difficulty throughout session. No reports of difficulty masticating or manipulating solids. Min verbal reminders for small sips. Recommend she continue thin liquids with cup, no straws and Dys 3 diet with upgrade per family member once home. No further ST needed.      HPI HPI: 75 y.o. female with a history of CAD, DM, dementia, GERD, sleep recent fall, apnea presented to the emergency department with acute onset of confusion and gradual decline in performing ADL's. CXR  remarkable for small left pleural effusion. UA without signs of infection. Head CT negative. No prior ST notes.      SLP Plan  Discharge SLP treatment due to (comment);All goals met     Recommendations  Diet recommendations: Dysphagia 3 (mechanical soft);Thin liquid Liquids provided via: Cup;No straw Medication Administration: Whole meds with puree Supervision: Patient able to self feed;Full supervision/cueing for compensatory strategies Compensations: Minimize environmental distractions;Slow rate;Small sips/bites Postural Changes and/or Swallow Maneuvers: Seated upright 90 degrees             Oral Care Recommendations: Oral care BID Follow up Recommendations: 24 hour supervision/assistance Plan: Discharge SLP treatment due to (comment);All goals met     GO           Functional Assessment Tool Used: skilled clinical judgement Functional Limitations: Swallowing Swallow Current Status (Y3016): 0 percent impaired, limited or restricted Swallow Goal Status  (W1093): At least 1 percent but less than 20 percent impaired, limited or restricted Swallow Discharge Status [phone]): At least 1 percent but less than 20 percent impaired, limited or restricted    [name] 01/03/2016, 3:09 PM  [name] [name].[employer] [phone]

## 2016-01-03 NOTE — Care Management Obs Status (Signed)
[place] NOTIFICATION   Patient Details  Name: [name] MRN: [id number] Date of Birth: 14-Feb-1941   Medicare Observation Status Notification Given:  Yes    Deidre Carino, Rory Percy, RN 01/03/2016, 1:52 PM

## 2016-01-03 NOTE — Progress Notes (Signed)
After granddaughter left, patient became increasing agitated and frequently attempted to get out of bed to use the restroom, despite using the bedpan multiple times tonight.  Ativan 0.5 mg PO PRN administered; however, this proved to be ineffective.  RN sat in patient's room for about an hour in an attempt to curb patient's attempts to get out of bed.  Notified PA on-call.  New orders received for a safety sitter and for IV haldol 2 mg once.  Attempted to administer haldol, but patient's IV infiltrated.  Removed IV.  Left arm elevated on pillow, and warm compress applied.  IV team notified for placement of a new IV.  Paged PA on-call regarding situation.  Will continue to monitor closely.

## 2016-01-03 NOTE — Care Management Obs Status (Signed)
[place] NOTIFICATION   Patient Details  Name: [name] MRN: [id number] Date of Birth: 1941/08/22   Medicare Observation Status Notification Given:  Yes    Clancy Mullarkey, Rory Percy, RN 01/03/2016, 1:52 PM

## 2016-01-03 NOTE — Evaluation (Signed)
Physical Therapy Evaluation Patient Details Name: [name] MRN: [id number] DOB: 22-Oct-1941 Today's Date: 01/03/2016   History of Present Illness  75 yo female with onset of acute encephalopathy and lactic acidosis, new non ambulatory status.  PMHx:  CAD, DM, cardiomyopathy, CHF.    Clinical Impression  Pt is getting up to walk and has some limited endurance but did work with PT on all points including scooting up side of bed to get back in.   PT did not leave pt up in chair as she did not have a recliner nor an alarm pad, which CNA is currently finding.  Will have her up later when available.    Follow Up Recommendations SNF    Equipment Recommendations  None recommended by PT    Recommendations for Other Services Rehab consult     Precautions / Restrictions Precautions Precautions: Fall Restrictions Weight Bearing Restrictions: No      Mobility  Bed Mobility Overal bed mobility: Needs Assistance Bed Mobility: Sit to Supine;Supine to Sit     Supine to sit: Min guard;Min assist Sit to supine: Min guard;Min assist   General bed mobility comments: cued safety and sequence, pt not using bed rail and using level bed  Transfers Overall transfer level: Needs assistance Equipment used: Rolling walker (2 wheeled);1 person hand held assist Transfers: Sit to/from [facility] Sit to Stand: Mod assist Stand pivot transfers: Min assist       General transfer comment: tends to choose to push up from chair  Ambulation/Gait Ambulation/Gait assistance: Min guard;Min assist Ambulation Distance (Feet): 30 Feet Assistive device: Rolling walker (2 wheeled);1 person hand held assist Gait Pattern/deviations: Step-to pattern;Wide base of support;Trunk flexed;Shuffle Gait velocity: reduced Gait velocity interpretation: Below normal speed for age/gender General Gait Details: Pt tends to struggle with distance, not able to generate energy to perpetuate task,    Stairs            Wheelchair Mobility    Modified Rankin (Stroke Patients Only)       Balance Overall balance assessment: Needs assistance Sitting-balance support: Feet supported Sitting balance-Leahy Scale: Good   Postural control: Posterior lean Standing balance support: Bilateral upper extremity supported Standing balance-Leahy Scale: Poor                               Pertinent Vitals/Pain Pain Assessment: No/denies pain    Home Living Family/patient expects to be discharged to:: Private residence Living Arrangements: Children Available Help at Discharge: Available PRN/intermittently;Friend(s) Type of Home: House Home Access: Stairs to enter   [employer] of Steps: 4-5 Home Layout: One level Home Equipment: Environmental consultant - 2 wheels;Shower seat;Bedside commode Additional Comments: Pt has been refusing to walk at all lately    Prior Function Level of Independence: Needs assistance   Gait / Transfers Assistance Needed: assist with walker and declining at times  ADL's / Homemaking Assistance Needed: has family assist        Hand Dominance   Dominant Hand: Right    Extremity/Trunk Assessment   Upper Extremity Assessment: Overall WFL for tasks assessed           Lower Extremity Assessment: Generalized weakness      Cervical / Trunk Assessment: Normal  Communication   Communication: No difficulties  Cognition Arousal/Alertness: Awake/alert Behavior During Therapy: Restless Overall Cognitive Status: History of cognitive impairments - at baseline       Memory: Decreased recall of  precautions;Decreased short-term memory              General Comments General comments (skin integrity, edema, etc.): Pt tends to get off task with walker and needed to be reminded of sequence and objectives of therapy and with walking task.  Her granddaughter is with her and discussed pt being recently not willing to walk much.    Exercises         Assessment/Plan    PT Assessment Patient needs continued PT services  PT Diagnosis Difficulty walking;Generalized weakness   PT Problem List Decreased strength;Decreased range of motion;Decreased activity tolerance;Decreased balance;Decreased mobility;Decreased coordination;Decreased cognition;Decreased knowledge of use of DME;Decreased safety awareness  PT Treatment Interventions DME instruction;Gait training;Stair training;Functional mobility training;Therapeutic activities;Therapeutic exercise;Balance training;Neuromuscular re-education;Patient/family education   PT Goals (Current goals can be found in the Care Plan section) Acute Rehab PT Goals Patient Stated Goal: To get through [employer] (thinks she is there) PT Goal Formulation: With patient/family Time For Goal Achievement: 01/17/16 Potential to Achieve Goals: Good    Frequency Min 2X/week   Barriers to discharge Decreased caregiver support;Inaccessible home environment family not with pt at all times, but granddaughter is expressing concern as well    Co-evaluation               End of Session Equipment Utilized During Treatment: Gait belt Activity Tolerance: Patient limited by fatigue Patient left: in bed;with call bell/phone within reach;with bed alarm set;with family/visitor present Nurse Communication: Mobility status    Functional Assessment Tool Used: clinical judgment Functional Limitation: Mobility: Walking and moving around Mobility: Walking and Moving Around Current Status [id number]): At least 20 percent but less than 40 percent impaired, limited or restricted Mobility: Walking and Moving Around Goal Status [phone]): At least 20 percent but less than 40 percent impaired, limited or restricted    Time: 1033-1059 PT Time Calculation (min) (ACUTE ONLY): 26 min   Charges:   PT Evaluation $PT Eval Moderate Complexity: 1 Procedure PT Treatments $Gait Training: 8-22 mins   PT G Codes:   PT G-Codes  **NOT FOR INPATIENT CLASS** Functional Assessment Tool Used: clinical judgment Functional Limitation: Mobility: Walking and moving around Mobility: Walking and Moving Around Current Status [id number]): At least 20 percent but less than 40 percent impaired, limited or restricted Mobility: Walking and Moving Around Goal Status [phone]): At least 20 percent but less than 40 percent impaired, limited or restricted    Ramond Dial 01/03/2016, 12:38 PM   Mee Hives, PT MS Acute Rehab Dept. Number: [phone] [id number] and [place] [phone]

## 2016-01-03 NOTE — Progress Notes (Signed)
Inpatient Diabetes Program Recommendations  AACE/ADA: New Consensus Statement on Inpatient Glycemic Control (2015)  Target Ranges:  Prepandial:   less than 140 mg/dL      Peak postprandial:   less than 180 mg/dL (1-2 hours)      Critically ill patients:  140 - 180 mg/dL   Results for [name], [name] (MRN [id number]) as of 01/03/2016 09:06  Ref. Range 01/02/2016 08:24 01/02/2016 13:30 01/02/2016 16:12 01/02/2016 22:39 01/03/2016 07:54  Glucose-Capillary Latest Ref Range: 65-99 mg/dL 264 (H) 164 (H) 230 (H) 141 (H) 216 (H)   Review of Glycemic Control  Diabetes history: DM 2 Outpatient Diabetes medications: Lantus 12-14 units, Humalog 0-10 units TID, Metformin 2,000 mg Daily after supper Current orders for Inpatient glycemic control: Novolog Sensitive TID  Inpatient Diabetes Program Recommendations: Insulin - Basal: Fasting glucose in 200's, patient takes Lantus 12-14 units at home. Please consider starting Lantus 10-12 units Q24hrs.  Thanks,  Tama Headings RN, MSN, [facility] Inpatient Diabetes Coordinator Team Pager [phone] (8a-5p)

## 2016-01-03 NOTE — Consult Note (Signed)
[facility] Face-to-Face Psychiatry Consult   Reason for Consult:  Dementia with psychosis Referring Physician:  Dr. 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 Lactic acidosis [E87.2] 01/02/2016  . Encephalopathy acute [G93.40] 01/02/2016  . Acute encephalopathy [G93.40] 01/02/2016  . Senile dementia without behavioral disturbance [F03.90] 10/06/2015  . UTI (urinary tract infection) [N39.0] 10/06/2015  . Obesity (BMI 30-39.9) [E66.9] 10/06/2015  . COLD (chronic obstructive lung disease) ([place]) [J44.9] 07/22/2015  . Tobacco use disorder [F17.200] 03/17/2015  . Cardiomyopathy, ischemic [I25.5] 03/17/2015  . Allergy to ACE inhibitors [Z88.8] 03/17/2015  . Intolerance of drug [Z88.9] 03/17/2015  . Ventricular tachycardia ([place]) [I47.2] 03/17/2015  . Chest pain [R07.9] 03/16/2015  . HLD (hyperlipidemia) [E78.5] 12/31/2014  . COPD (chronic obstructive pulmonary disease) ([place]) [J44.9] 12/31/2014  . HCAP (healthcare-associated pneumonia) [J18.9] 12/31/2014  . Diabetes mellitus without complication ([place]) [H73.4]   . Atelectasis [J98.11] 01/23/2014  . Chronic systolic heart failure ([place]) [I50.22] 10/12/2013  . Acute diastolic CHF (congestive heart failure) ([place]) [I50.31] 10/10/2013  . Hypomagnesemia [E83.42] 10/10/2013  . Chronic cough [R05] 09/12/2013  . Acute respiratory failure ([place]) [J96.00] 05/20/2013  . Memory deficit [R41.3] 04/23/2013  . Essential hypertension, benign [I10] 04/03/2013  . Dyslipidemia [E78.5] 04/03/2013  . COPD exacerbation ([place]) [J44.1] 04/03/2013  . Coronary atherosclerosis of native coronary artery [I25.10] 04/03/2013  . DM2 (diabetes mellitus, type 2) ([place]) [E11.9] 04/02/2013  . Hyperglycemia [R73.9] 04/02/2013  . Hypokalemia [E87.6] 04/02/2013  . Fibromyalgia [M79.7] 04/20/2008    Total Time spent with patient:  1 hour  Subjective:   [name] is a 75 y.o. female patient admitted with dementia with psychosis.  HPI:  [name] is a 75 y.o. female seen, chart reviewed for face-to-face psychiatric consultation and evaluation of increased symptoms of dementia, agitation, aggression and psychosis. Reportedly patient presented with acute onset of confusion and recent history of fall at home and required emergency evaluation at the [facility]. Reportedly CT scan of brain was negative at that time. Patient is a poor historian as she does not remember basic information about her current clinical situation and living arrangements. Patient granddaughter was at bedside reportedly came from [place] staying with her for the last 2 weeks secondary to deterioration of her health condition. One of the patient daughter is her legal guardian and power of attorney who cannot care for her at home because of her own high needy children, but continue to be supportive. Patient was not able to walk without support, unable to take showers reportedly broke the shower 2 days ago. Patient has been receiving paroxetine from primary care physician Harlan Stains, M.D. I would like to discontinue her medication which causes sedation paroxetine, lorazepam and Depakote as she has prone for falls. Will restart Abilify 2 mg twice daily for agitation and confusion associated with dementia and lack of sleep (QTC prolongation is 490 as of current EKG) and Remeron Disintegrating tablet 15 mg at bedtime for insomnia. Patient has no suicidal/homicidal ideation.   Medical history: Patient with a history of CAD,ICM,T2 DM, dementia, sleep apnea, and other medical issues listed below, presenting to the emergency department with acute onset of confusion. She was having difficulties ambulating prior to these events, and some speech difficulty, Repaired gravely say "help", which quickly resolved.low grade fever, nausea vomiting or vertigo was  reported by family. She did fall about 2 weeks  ago, hitting the occipital area without losing consciousness, at which time she was seen at the emergency department, at which time a CT on 12/22/2015 was negative for acute intracranial findings or fractures.Grandaughter reports that the patient has been increasingly weaker, essentially bed-bound, unable to feed herself, feels that she may have forgotten how to eat, bathe, and other activities of daily living.The patient lives alone, and family is unable to assess the patient in the interim.  Past Psychiatric History: Patient has been diagnosed with a dementia and has been receiving medication management from primary physician. Patient has no history of acute psychiatric hospitalization.  Risk to Self: Is patient at risk for suicide?: No Risk to Others:   Prior Inpatient Therapy:   Prior Outpatient Therapy:    Past Medical History:  Past Medical History  Diagnosis Date  . CAD (coronary artery disease)     a. s/p inferior STEMI 12/29/10 with rotablator atherectomy RCA 12/31/10 and DES x 2 RCA;  b. Lexiscan Myoview (02/2015): mild reversible apical anterior perfusion defect. C. cath 02/2015 50% LAD lesion with patent stent, Tx Rx    . Ischemic cardiomyopathy     EF 45%cath 2012 EF55% 5/12; 35% 11/14  . Diabetes mellitus     type 2  . HTN (hypertension)   . Hyperlipidemia   . Diverticulosis   . Respiratory arrest//ACE Inhibitor presumed cause   . COPD (chronic obstructive pulmonary disease) ([place])   . Small bowel obstruction (HCC)     from a sigmoid stricture  . Nephrolithiasis   . Sleep apnea   . Overweight(278.02)   . Osteoarthritis   . GERD (gastroesophageal reflux disease)   . Anxiety   . Glaucoma   . Memory deficit   . Dementia     Past Surgical History  Procedure Laterality Date  . Bilateral tubal ligation    . Lithotripsy    . 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 Cardiac surgery    . Cardiac  catheterization N/A 03/18/2015    Procedure: LEFT HEART CATH AND CORONARY ANGIOGRAPHY;  Surgeon: Lorretta Harp, MD;  Location: [facility] CATH LAB;  Service: Cardiovascular;  Laterality: N/A;   Family History:  Family History  Problem Relation Age of Onset  . Breast cancer Maternal Grandmother   . Diabetes Father   . Heart disease Father   . Diabetes Sister   . Colon cancer Neg Hx   . Heart attack Father   . Hypertension Sister   . Stroke Mother    Family Psychiatric  History: not significant for mental illness Social History:  History  Alcohol Use No     History  Drug Use No    Social History   Social History  . Marital Status: Widowed    Spouse Name: N/A  . Number of Children: 2  . Years of Education: 11   Occupational History  . Retired     Haematologist    Social History Main Topics  . Smoking status: Current Every Day Smoker -- 1.00 packs/day for 25 years    Types: Cigarettes  . Smokeless tobacco: Never Used     Comment: over a ppd for 40+ years; quit 10/2004.  unsuccessfully tried eCigs.  . Alcohol Use: No  . Drug Use: No  . Sexual Activity: No   Other Topics Concern  . None   Social History Narrative   Patient is widowed with 2 children.   Patient is right handed.  Patient has 11 th grade education.   Patient drinks 6-7 daily.   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     Allergies:   Allergies  Allergen Reactions  . Lisinopril Other (See Comments)    REACTION: respiratory arrest jan 2009  . Spironolactone Other (See Comments)    PATIENT HAD SEVERE SIDE EFFECTS  . Donepezil Diarrhea  . Eggs Or Egg-Derived Products Diarrhea  . Erythromycin Other (See Comments)    Makes me feel weird   . Macrolides And Ketolides Other (See Comments)  . Penicillins Itching and Other (See Comments)    Too much as  a kid  . Quinapril Hcl Itching and Other (See Comments)    Too much AS A KID  . Angiotensin Receptor Blockers Other (See Comments)    unknown  . Lipitor [Atorvastatin] Palpitations    FAST HEART RATE  . Pentazocine Lactate Other (See Comments)    Reaction unknown  . Propoxyphene Hcl Other (See Comments)    Reaction unknown  . Sulfonamide Derivatives Other (See Comments)    Childhood allergy  . Vancomycin Rash    CLOSES THROAT. ANY MYCIN FAMILY    Labs:  Results for orders placed or performed during the hospital encounter of 01/01/16 (from the past 48 hour(s))  CBG monitoring, ED     Status: Abnormal   Collection Time: 01/01/16 10:36 PM  Result Value Ref Range   Glucose-Capillary 201 (H) 65 - 99 mg/dL  Urinalysis, Routine w reflex microscopic (not at [facility]     Status: Abnormal   Collection Time: 01/01/16 10:41 PM  Result Value Ref Range   Color, Urine AMBER (A) YELLOW    Comment: BIOCHEMICALS MAY BE AFFECTED BY COLOR   APPearance CLEAR CLEAR   Specific Gravity, Urine 1.019 1.005 - 1.030   pH 6.0 5.0 - 8.0   Glucose, UA NEGATIVE NEGATIVE mg/dL   Hgb urine dipstick NEGATIVE NEGATIVE   Bilirubin Urine SMALL (A) NEGATIVE   Ketones, ur 15 (A) NEGATIVE mg/dL   Protein, ur 30 (A) NEGATIVE mg/dL   Nitrite NEGATIVE NEGATIVE   Leukocytes, UA TRACE (A) NEGATIVE  Urine microscopic-add on     Status: Abnormal   Collection Time: 01/01/16 10:41 PM  Result Value Ref Range   Squamous Epithelial / LPF 0-5 (A) NONE SEEN   WBC, UA 0-5 0 - 5 WBC/hpf   RBC / HPF 0-5 0 - 5 RBC/hpf   Bacteria, UA RARE (A) NONE SEEN   Casts HYALINE CASTS (A) NEGATIVE  Urine rapid drug screen (hosp performed)     Status: None   Collection Time: 01/01/16 10:41 PM  Result Value Ref Range   Opiates NONE DETECTED NONE DETECTED   Cocaine NONE DETECTED NONE DETECTED   Benzodiazepines NONE DETECTED NONE DETECTED   Amphetamines NONE DETECTED NONE DETECTED   Tetrahydrocannabinol NONE DETECTED NONE DETECTED    Barbiturates NONE DETECTED NONE DETECTED    Comment:        DRUG SCREEN FOR MEDICAL PURPOSES ONLY.  IF CONFIRMATION IS NEEDED FOR ANY PURPOSE, NOTIFY LAB WITHIN 5 DAYS.        LOWEST DETECTABLE LIMITS FOR URINE DRUG SCREEN Drug Class       Cutoff (ng/mL) Amphetamine      1000 Barbiturate      200 Benzodiazepine   270 Tricyclics  300 Opiates          300 Cocaine          300 THC              50   Comprehensive metabolic panel     Status: Abnormal   Collection Time: 01/01/16 11:14 PM  Result Value Ref Range   Sodium 139 135 - 145 mmol/L   Potassium 3.7 3.5 - 5.1 mmol/L   Chloride 97 (L) 101 - 111 mmol/L   CO2 29 22 - 32 mmol/L   Glucose, Bld 216 (H) 65 - 99 mg/dL   BUN 17 6 - 20 mg/dL   Creatinine, Ser 1.03 (H) 0.44 - 1.00 mg/dL   Calcium 8.9 8.9 - 10.3 mg/dL   Total Protein 7.4 6.5 - 8.1 g/dL   Albumin 3.7 3.5 - 5.0 g/dL   AST 21 15 - 41 U/L   ALT 13 (L) 14 - 54 U/L   Alkaline Phosphatase 73 38 - 126 U/L   Total Bilirubin 0.7 0.3 - 1.2 mg/dL   GFR calc non Af Amer 52 (L) >60 mL/min   GFR calc Af Amer >60 >60 mL/min    Comment: (NOTE) The eGFR has been calculated using the CKD EPI equation. This calculation has not been validated in all clinical situations. eGFR's persistently <60 mL/min signify possible Chronic Kidney Disease.    Anion gap 13 5 - 15  CBC     Status: Abnormal   Collection Time: 01/01/16 11:14 PM  Result Value Ref Range   WBC 10.7 (H) 4.0 - 10.5 K/uL   RBC 4.64 3.87 - 5.11 MIL/uL   Hemoglobin 14.9 12.0 - 15.0 g/dL   HCT 44.0 36.0 - 46.0 %   MCV 94.8 78.0 - 100.0 fL   MCH 32.1 26.0 - 34.0 pg   MCHC 33.9 30.0 - 36.0 g/dL   RDW 12.9 11.5 - 15.5 %   Platelets 195 150 - 400 K/uL  I-Stat CG4 Lactic Acid, ED  (not at [facility])     Status: Abnormal   Collection Time: 01/01/16 11:16 PM  Result Value Ref Range   Lactic Acid, Venous 2.46 (HH) 0.5 - 2.0 mmol/L   Comment NOTIFIED PHYSICIAN   Brain natriuretic peptide     Status: None   Collection Time:  01/02/16  1:42 AM  Result Value Ref Range   B Natriuretic Peptide 71.8 0.0 - 100.0 pg/mL  I-Stat CG4 Lactic Acid, ED  (not at [facility])     Status: Abnormal   Collection Time: 01/02/16  2:10 AM  Result Value Ref Range   Lactic Acid, Venous 2.30 (HH) 0.5 - 2.0 mmol/L   Comment NOTIFIED PHYSICIAN   Glucose, capillary     Status: Abnormal   Collection Time: 01/02/16  8:24 AM  Result Value Ref Range   Glucose-Capillary 264 (H) 65 - 99 mg/dL  Lactic acid, plasma     Status: Abnormal   Collection Time: 01/02/16  8:36 AM  Result Value Ref Range   Lactic Acid, Venous 2.3 (HH) 0.5 - 2.0 mmol/L    Comment: CRITICAL RESULT CALLED TO, READ BACK BY AND VERIFIED WITH: RN RIO,C AT [phone] [id number] MARTINB   Ammonia     Status: None   Collection Time: 01/02/16 10:02 AM  Result Value Ref Range   Ammonia 26 9 - 35 umol/L  Hemoglobin A1c     Status: Abnormal   Collection Time: 01/02/16 11:01 AM  Result Value Ref Range  Hgb A1c MFr Bld 7.4 (H) 4.8 - 5.6 %    Comment: (NOTE)         Pre-diabetes: 5.7 - 6.4         Diabetes: >6.4         Glycemic control for adults with diabetes: <7.0    Mean Plasma Glucose 166 mg/dL    Comment: (NOTE) Performed At: [facility] [place], [place] [id number] Lindon Romp MD TA:[id number]   Procalcitonin     Status: None   Collection Time: 01/02/16 11:01 AM  Result Value Ref Range   Procalcitonin <0.10 ng/mL    Comment:        Interpretation: PCT (Procalcitonin) <= 0.5 ng/mL: Systemic infection (sepsis) is not likely. Local bacterial infection is possible. (NOTE)         ICU PCT Algorithm               Non ICU PCT Algorithm    ----------------------------     ------------------------------         PCT < 0.25 ng/mL                 PCT < 0.1 ng/mL     Stopping of antibiotics            Stopping of antibiotics       strongly encouraged.               strongly encouraged.    ----------------------------      ------------------------------       PCT level decrease by               PCT < 0.25 ng/mL       >= 80% from peak PCT       OR PCT 0.25 - 0.5 ng/mL          Stopping of antibiotics                                             encouraged.     Stopping of antibiotics           encouraged.    ----------------------------     ------------------------------       PCT level decrease by              PCT >= 0.25 ng/mL       < 80% from peak PCT        AND PCT >= 0.5 ng/mL            Continuin g antibiotics                                              encouraged.       Continuing antibiotics            encouraged.    ----------------------------     ------------------------------     PCT level increase compared          PCT > 0.5 ng/mL         with peak PCT AND          PCT >= 0.5 ng/mL             Escalation of antibiotics  strongly encouraged.      Escalation of antibiotics        strongly encouraged.   Glucose, capillary     Status: Abnormal   Collection Time: 01/02/16  1:30 PM  Result Value Ref Range   Glucose-Capillary 164 (H) 65 - 99 mg/dL  Glucose, capillary     Status: Abnormal   Collection Time: 01/02/16  4:12 PM  Result Value Ref Range   Glucose-Capillary 230 (H) 65 - 99 mg/dL  Glucose, capillary     Status: Abnormal   Collection Time: 01/02/16 10:39 PM  Result Value Ref Range   Glucose-Capillary 141 (H) 65 - 99 mg/dL  CBC with Differential/Platelet     Status: None   Collection Time: 01/03/16  5:24 AM  Result Value Ref Range   WBC 8.0 4.0 - 10.5 K/uL   RBC 4.55 3.87 - 5.11 MIL/uL   Hemoglobin 14.1 12.0 - 15.0 g/dL   HCT 42.6 36.0 - 46.0 %   MCV 93.6 78.0 - 100.0 fL   MCH 31.0 26.0 - 34.0 pg   MCHC 33.1 30.0 - 36.0 g/dL   RDW 12.8 11.5 - 15.5 %   Platelets 159 150 - 400 K/uL   Neutrophils Relative % 69 %   Neutro Abs 5.6 1.7 - 7.7 K/uL   Lymphocytes Relative 22 %   Lymphs Abs 1.7 0.7 - 4.0 K/uL   Monocytes Relative 7 %    Monocytes Absolute 0.5 0.1 - 1.0 K/uL   Eosinophils Relative 1 %   Eosinophils Absolute 0.1 0.0 - 0.7 K/uL   Basophils Relative 1 %   Basophils Absolute 0.0 0.0 - 0.1 K/uL  Basic metabolic panel     Status: Abnormal   Collection Time: 01/03/16  5:24 AM  Result Value Ref Range   Sodium 139 135 - 145 mmol/L   Potassium 3.0 (L) 3.5 - 5.1 mmol/L    Comment: DELTA CHECK NOTED   Chloride 104 101 - 111 mmol/L   CO2 25 22 - 32 mmol/L   Glucose, Bld 257 (H) 65 - 99 mg/dL   BUN 10 6 - 20 mg/dL   Creatinine, Ser 0.85 0.44 - 1.00 mg/dL   Calcium 8.5 (L) 8.9 - 10.3 mg/dL   GFR calc non Af Amer >60 >60 mL/min   GFR calc Af Amer >60 >60 mL/min    Comment: (NOTE) The eGFR has been calculated using the CKD EPI equation. This calculation has not been validated in all clinical situations. eGFR's persistently <60 mL/min signify possible Chronic Kidney Disease.    Anion gap 10 5 - 15  Glucose, capillary     Status: Abnormal   Collection Time: 01/03/16  7:54 AM  Result Value Ref Range   Glucose-Capillary 216 (H) 65 - 99 mg/dL    Current Facility-Administered Medications  Medication Dose Route Frequency Provider Last Rate Last Dose  . acetaminophen (TYLENOL) tablet 650 mg  650 mg Oral Q6H PRN Rondel Jumbo, PA-C       Or  . acetaminophen (TYLENOL) suppository 650 mg  650 mg Rectal Q6H PRN Rondel Jumbo, PA-C      . albuterol (PROVENTIL) (2.5 MG/3ML) 0.083% nebulizer solution 2.5 mg  2.5 mg Nebulization QID Rondel Jumbo, PA-C   2.5 mg at 01/03/16 0858  . aspirin EC tablet 81 mg  81 mg Oral Daily Rondel Jumbo, PA-C   81 mg at 01/03/16 1027  . budesonide (PULMICORT) nebulizer solution 0.25 mg  0.25 mg Nebulization BID  Rondel Jumbo, PA-C   0.25 mg at 01/03/16 0901  . clopidogrel (PLAVIX) tablet 75 mg  75 mg Oral Daily Rondel Jumbo, PA-C   75 mg at 01/03/16 1027  . divalproex (DEPAKOTE SPRINKLE) capsule 125 mg  125 mg Oral Q12H Shanker Kristeen Mans, MD      . enoxaparin (LOVENOX) injection 30 mg   30 mg Subcutaneous Q24H Rondel Jumbo, PA-C   30 mg at 01/02/16 1714  . furosemide (LASIX) tablet 20 mg  20 mg Oral Daily Rondel Jumbo, PA-C   20 mg at 01/03/16 1027  . HYDROcodone-acetaminophen (NORCO/VICODIN) 5-325 MG per tablet 1 tablet  1 tablet Oral Q4H PRN Norval Morton, MD      . HYDROmorphone (DILAUDID) injection 0.25 mg  0.25 mg Intravenous Q4H PRN Rondell A Tamala Julian, MD      . insulin aspart (novoLOG) injection 0-9 Units  0-9 Units Subcutaneous TID WC Rondel Jumbo, PA-C   3 Units at 01/03/16 1027  . isosorbide mononitrate (IMDUR) 24 hr tablet 15 mg  15 mg Oral Daily Rondel Jumbo, PA-C   15 mg at 01/03/16 1027  . LORazepam (ATIVAN) injection 1 mg  1 mg Intravenous Q6H PRN Shanker Kristeen Mans, MD      . magnesium sulfate IVPB 2 g 50 mL  2 g Intravenous Once Jonetta Osgood, MD      . memantine (NAMENDA XR) 24 hr capsule 28 mg  28 mg Oral Daily Coralee Pesa Wertman, PA-C   28 mg at 01/03/16 1026  . metoprolol succinate (TOPROL-XL) 24 hr tablet 25 mg  25 mg Oral Daily Rondel Jumbo, PA-C   25 mg at 01/03/16 1027  . ondansetron (ZOFRAN) tablet 4 mg  4 mg Oral Q6H PRN Rondel Jumbo, PA-C       Or  . ondansetron [facility]) injection 4 mg  4 mg Intravenous Q6H PRN Rondel Jumbo, PA-C      . pantoprazole (PROTONIX) EC tablet 40 mg  40 mg Oral Daily Rondel Jumbo, PA-C   40 mg at 01/03/16 1027  . PARoxetine (PAXIL) tablet 10 mg  10 mg Oral Daily Rondel Jumbo, PA-C   10 mg at 01/03/16 1026  . potassium chloride SA (K-DUR,KLOR-CON) CR tablet 40 mEq  40 mEq Oral BID Jonetta Osgood, MD   40 mEq at 01/03/16 1026  . pravastatin (PRAVACHOL) tablet 40 mg  40 mg Oral q1800 Rondel Jumbo, PA-C   40 mg at 01/02/16 1714    Musculoskeletal: Strength & Muscle Tone: decreased Gait & Station: unsteady Patient leans: N/A  Psychiatric Specialty Exam: ROS abdominal pain, insomnia, forgetfulness unable to recognize close family members No Fever-chills, No Headache, No changes with Vision or hearing,  reports vertigo No problems swallowing food or Liquids, No Chest pain, Cough or Shortness of Breath, No Abdominal pain, No Nausea or Vommitting, Bowel movements are regular, No Blood in stool or Urine, No dysuria, No new skin rashes or bruises, No new joints pains-aches,  No new weakness, tingling, numbness in any extremity, No recent weight gain or loss, No polyuria, polydypsia or polyphagia,   A full 10 point Review of Systems was done, except as stated above, all other Review of Systems were negative.  Blood pressure 130/68, pulse 64, temperature 98 F (36.7 C), temperature source Oral, resp. rate 22, height '5\' 3"'  (1.6 m), weight 65.3 kg (143 lb 15.4 oz), SpO2 94 %.Body mass index is 25.51 kg/(m^2).  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 while sitting in a chair in the hospital room  Thought Content:  Response "I do not know" for most of the questions  Suicidal Thoughts:  No  Homicidal Thoughts:  No  Memory:  Immediate;   Fair Recent;   Poor  Judgement:  Impaired  Insight:  Shallow  Psychomotor Activity:  Decreased  Concentration:  Poor  Recall:  Poor  Fund of Knowledge:Fair  Language: Fair  Akathisia:  Negative  Handed:  Right  AIMS (if indicated):     Assets:  Catering manager Housing Leisure Time Social Support Transportation  ADL's:  Impaired  Cognition: Impaired,  Moderate  Sleep:      Treatment Plan Summary: Daily contact with patient to assess and evaluate symptoms and progress in treatment and Medication management  Patient has been suffering with significant cognitive deficits secondary to dementia, history of recent fall, unsteady on her feet, need walker to support her and idiopathic  insomnia. Abilify 2 mg twice daily for agitation associated with the dementia  Remeron soluble tablets 15 mg daily at bedtime for insomnia Discontinue Depakote, Ativan and Paxil secondary to sedative affect and risk of fall Appreciate psychiatric consultation and follow up as clinically required Please contact [phone] or [phone] if needs further assistance  Disposition: Refer to the unit social worker and case manager regarding placement issues Patient benefit from the skilled nursing facility as she cannot care for herself and has no 24-hour [place] is at home Patient does not meet criteria for psychiatric inpatient admission. Supportive therapy provided about ongoing stressors.  Durward Parcel., MD 01/03/2016 10:59 AM

## 2016-01-04 DIAGNOSIS — F0390 Unspecified dementia without behavioral disturbance: Secondary | ICD-10-CM | POA: Diagnosis not present

## 2016-01-04 DIAGNOSIS — I2510 Atherosclerotic heart disease of native coronary artery without angina pectoris: Secondary | ICD-10-CM | POA: Diagnosis not present

## 2016-01-04 DIAGNOSIS — I5022 Chronic systolic (congestive) heart failure: Secondary | ICD-10-CM | POA: Diagnosis not present

## 2016-01-04 DIAGNOSIS — R0682 Tachypnea, not elsewhere classified: Secondary | ICD-10-CM | POA: Diagnosis not present

## 2016-01-04 DIAGNOSIS — E872 Acidosis: Secondary | ICD-10-CM | POA: Diagnosis not present

## 2016-01-04 DIAGNOSIS — E119 Type 2 diabetes mellitus without complications: Secondary | ICD-10-CM | POA: Diagnosis not present

## 2016-01-04 DIAGNOSIS — R4182 Altered mental status, unspecified: Secondary | ICD-10-CM | POA: Diagnosis not present

## 2016-01-04 LAB — GLUCOSE, CAPILLARY
GLUCOSE-CAPILLARY: 148 mg/dL — AB (ref 65–99)
GLUCOSE-CAPILLARY: 134 mg/dL — AB (ref 65–99)
Glucose-Capillary: 184 mg/dL — ABNORMAL HIGH (ref 65–99)
Glucose-Capillary: 189 mg/dL — ABNORMAL HIGH (ref 65–99)

## 2016-01-04 LAB — BASIC METABOLIC PANEL
ANION GAP: 15 (ref 5–15)
BUN: 12 mg/dL (ref 6–20)
CHLORIDE: 103 mmol/L (ref 101–111)
CO2: 26 mmol/L (ref 22–32)
Calcium: 9.4 mg/dL (ref 8.9–10.3)
Creatinine, Ser: 0.94 mg/dL (ref 0.44–1.00)
GFR calc Af Amer: >60 mL/min (ref >60)
GFR, EST NON AFRICAN AMERICAN: 58 mL/min — AB (ref >60)
GLUCOSE: 206 mg/dL — AB (ref 65–99)
POTASSIUM: 5.8 mmol/L — AB (ref 3.5–5.1)
Sodium: 144 mmol/L (ref 135–145)

## 2016-01-04 LAB — MAGNESIUM: MAGNESIUM: 2 mg/dL (ref 1.7–2.4)

## 2016-01-04 LAB — POTASSIUM: POTASSIUM: 4.7 mmol/L (ref 3.5–5.1)

## 2016-01-04 MED ORDER — OXYCODONE-ACETAMINOPHEN 5-325 MG PO TABS: 1.0000 | ORAL_TABLET | Freq: Four times a day (QID) | ORAL | Status: DC | PRN

## 2016-01-04 MED ORDER — MIRTAZAPINE 15 MG PO TBDP: 15.0000 mg | ORAL_TABLET | Freq: Every day | ORAL | Status: DC

## 2016-01-04 MED ORDER — MEMANTINE HCL ER 28 MG PO CP24: 28.0000 mg | ORAL_CAPSULE | Freq: Every day | ORAL | Status: DC

## 2016-01-04 MED ORDER — ARIPIPRAZOLE 2 MG PO TABS: 2.0000 mg | ORAL_TABLET | Freq: Two times a day (BID) | ORAL | Status: DC

## 2016-01-04 MED ORDER — SODIUM POLYSTYRENE SULFONATE 15 GM/60ML PO SUSP
30.0000 g | Freq: Once | ORAL | Status: AC
  Administered 2016-01-04: 30 g via ORAL
  Filled 2016-01-04: qty 120

## 2016-01-04 MED ORDER — LORAZEPAM 0.5 MG PO TABS: 0.5000 mg | ORAL_TABLET | Freq: Two times a day (BID) | ORAL | Status: DC | PRN

## 2016-01-04 MED ORDER — FUROSEMIDE 10 MG/ML IJ SOLN
20.0000 mg | Freq: Once | INTRAMUSCULAR | Status: AC
  Administered 2016-01-04: 20 mg via INTRAVENOUS
  Filled 2016-01-04: qty 2

## 2016-01-04 NOTE — Discharge Summary (Addendum)
PATIENT DETAILS Name: [name] Age: 75 y.o. Sex: female Date of Birth: 01/12/1941 MRN: [id number]. Admitting Physician: Etta Quill, DO [id number] S, MD  Admit Date: 01/01/2016 Discharge date: 01/04/2016  Recommendations for Outpatient Follow-up:  1. Please repeat CBC/BMET in 2-3 days  PRIMARY DISCHARGE DIAGNOSIS:  Principal Problem:   Senile dementia without behavioral disturbance Active Problems:   DM2 (diabetes mellitus, type 2) (HCC)   Dyslipidemia   Memory deficit   Chronic cough   Chronic systolic heart failure (HCC)   COPD (chronic obstructive pulmonary disease) (HCC)   Lactic acidosis   Encephalopathy acute   Acute encephalopathy      PAST MEDICAL HISTORY: Past Medical History  Diagnosis Date  . CAD (coronary artery disease)     a. s/p inferior STEMI 12/29/10 with rotablator atherectomy RCA 12/31/10 and DES x 2 RCA;  b. Lexiscan Myoview (02/2015): mild reversible apical anterior perfusion defect. C. cath 02/2015 50% LAD lesion with patent stent, Tx Rx    . Ischemic cardiomyopathy     EF 45%cath 2012 EF55% 5/12; 35% 11/14  . Diabetes mellitus     type 2  . HTN (hypertension)   . Hyperlipidemia   . Diverticulosis   . Respiratory arrest//ACE Inhibitor presumed cause   . COPD (chronic obstructive pulmonary disease) ([place])   . Small bowel obstruction (HCC)     from a sigmoid stricture  . Nephrolithiasis   . Sleep apnea   . Overweight(278.02)   . Osteoarthritis   . GERD (gastroesophageal reflux disease)   . Anxiety   . Glaucoma   . Memory deficit   . Dementia     DISCHARGE MEDICATIONS: Current Discharge Medication List    START taking these medications   Details  ARIPiprazole (ABILIFY) 2 MG tablet Take 1 tablet (2 mg total) by mouth 2 (two) times daily.    mirtazapine (REMERON SOL-TAB) 15 MG disintegrating tablet Take 1 tablet (15 mg total) by mouth at bedtime.      CONTINUE these medications which have CHANGED   Details  LORazepam  (ATIVAN) 0.5 MG tablet Take 1 tablet (0.5 mg total) by mouth 2 (two) times daily as needed for anxiety. Qty: 30 tablet, Refills: 0    memantine (NAMENDA XR) 28 MG CP24 24 hr capsule Take 1 capsule (28 mg total) by mouth daily.    oxyCODONE-acetaminophen (PERCOCET/ROXICET) 5-325 MG tablet Take 1 tablet by mouth every 6 (six) hours as needed for moderate pain or severe pain. Qty: 30 tablet, Refills: 0      CONTINUE these medications which have NOT CHANGED   Details  albuterol (PROVENTIL HFA;VENTOLIN HFA) 108 (90 BASE) MCG/ACT inhaler Inhale 1-2 puffs into the lungs every 6 (six) hours as needed for wheezing.     albuterol (PROVENTIL) (2.5 MG/3ML) 0.083% nebulizer solution Take 3 mLs (2.5 mg total) by nebulization 4 (four) times daily. Qty: 360 mL, Refills: 11    aspirin EC 81 MG tablet Take 81 mg by mouth daily.    budesonide (PULMICORT) 0.25 MG/2ML nebulizer solution Take 2 mLs (0.25 mg total) by nebulization 2 (two) times daily. Qty: 120 mL, Refills: 12    clopidogrel (PLAVIX) 75 MG tablet TAKE 1 TABLET BY MOUTH EVERY DAY Qty: 30 tablet, Refills: 0    furosemide (LASIX) 20 MG tablet Take 20 mg by mouth daily. EDEMA    insulin glargine (LANTUS) 100 UNIT/ML injection Inject 0.1 mLs (10 Units total) into the skin daily. Qty: 10 mL, Refills: 1  insulin lispro (HUMALOG) 100 UNIT/ML injection Inject 0-10 Units into the skin 3 (three) times daily before meals. Sliding scale: below 150=0 units, 150-200=2 units, 201-250=4 units, 251-300=6 units, 301-350=8 units, above 350=10 units    ipratropium (ATROVENT) 0.02 % nebulizer solution Take 2.5 mLs (0.5 mg total) by nebulization 4 (four) times daily. DX CODE J44.9 Qty: 360 mL, Refills: 0    isosorbide mononitrate (IMDUR) 30 MG 24 hr tablet Take 0.5 tablets (15 mg total) by mouth daily. Qty: 15 tablet, Refills: 5   Associated Diagnoses: Coronary artery disease involving native coronary artery of native heart without angina pectoris      metFORMIN (GLUCOPHAGE) 500 MG tablet Take 2,000 mg by mouth daily before supper. Reported on 12/22/2015    metoprolol succinate (TOPROL-XL) 25 MG 24 hr tablet Take 1 tablet (25 mg total) by mouth daily. Qty: 30 tablet, Refills: 3    mometasone-formoterol (DULERA) 100-5 MCG/ACT AERO Inhale 2 puffs into the lungs 2 (two) times daily.    omeprazole (PRILOSEC) 20 MG capsule Take 20 mg by mouth as needed (REFLUX).    pravastatin (PRAVACHOL) 40 MG tablet TAKE 1 TABLET (40 MG TOTAL) BY MOUTH DAILY. <PLEASE MAKE APPOINTMENT FOR REFILLS> Qty: 15 tablet, Refills: 0    nitroGLYCERIN (NITROSTAT) 0.4 MG SL tablet Place 1 tablet (0.4 mg total) under the tongue every 5 (five) minutes as needed. For chest pain. Qty: 25 tablet, Refills: 6   Associated Diagnoses: Coronary atherosclerosis of native coronary artery      STOP taking these medications     PARoxetine (PAXIL) 10 MG tablet      cefpodoxime (VANTIN) 200 MG tablet         ALLERGIES:   Allergies  Allergen Reactions  . Lisinopril Other (See Comments)    REACTION: respiratory arrest jan 2009  . Spironolactone Other (See Comments)    PATIENT HAD SEVERE SIDE EFFECTS  . Donepezil Diarrhea  . Eggs Or Egg-Derived Products Diarrhea  . Erythromycin Other (See Comments)    Makes me feel weird   . Macrolides And Ketolides Other (See Comments)  . Penicillins Itching and Other (See Comments)    Too much as a kid  . Quinapril Hcl Itching and Other (See Comments)    Too much AS A KID  . Angiotensin Receptor Blockers Other (See Comments)    unknown  . Lipitor [Atorvastatin] Palpitations    FAST HEART RATE  . Pentazocine Lactate Other (See Comments)    Reaction unknown  . Propoxyphene Hcl Other (See Comments)    Reaction unknown  . Sulfonamide Derivatives Other (See Comments)    Childhood allergy  . Vancomycin Rash    CLOSES THROAT. ANY MYCIN FAMILY    BRIEF HPI:  See H&P, Labs, Consult and Test reports for all details in brief, patient   is a 75 y.o. female with a past medical history significant for CAD, dementia, ischemic cardiomyopathy, type 2 diabetes who presented with delirium. She was admitted for further evaluation.  CONSULTATIONS:   psychiatry  PERTINENT RADIOLOGIC STUDIES: Dg Chest 2 View  01/01/2016  CLINICAL DATA:  Acute onset of altered mental status. Initial encounter. EXAM: CHEST  2 VIEW COMPARISON:  Chest radiograph performed 10/06/2015 FINDINGS: The lungs are well-aerated. A small left pleural effusion is noted. There is no evidence of focal opacification, pleural effusion or pneumothorax. The heart is borderline enlarged. No acute osseous abnormalities are seen. IMPRESSION: Small left pleural effusion noted. Lungs otherwise clear. Borderline cardiomegaly. Electronically Signed   By:  Garald Balding M.D.   On: 01/01/2016 23:43   Dg Thoracic Spine 2 View  01/02/2016  CLINICAL DATA:  75 year old female with thoracic spine pain. EXAM: THORACIC SPINE 2 VIEWS COMPARISON:  01/01/2016 and prior exams. FINDINGS: There is no evidence of acute fracture subluxation. Mild degenerative disc disease and spondylosis again noted. No focal bony lesions are present. A left pleural effusion is again identified. IMPRESSION: No evidence of acute thoracic spine abnormality Electronically Signed   By: Margarette Canada M.D.   On: 01/02/2016 15:09   Dg Lumbar Spine 2-3 Views  01/02/2016  CLINICAL DATA:  Low back pain. EXAM: LUMBAR SPINE - 2-3 VIEW COMPARISON:  CT abdomen and pelvis from the same day. FINDINGS: Five non rib-bearing lumbar type vertebral bodies are present. A remote superior endplate compression fracture of L5 is stable. Extensive atherosclerotic changes in the aorta are again noted. IMPRESSION: 1. No acute abnormality. 2. Remote superior endplate compression fracture at L5. 3. Atherosclerosis. Electronically Signed   By: San Morelle M.D.   On: 01/02/2016 15:14   Ct Head Wo Contrast  01/02/2016  CLINICAL DATA:  Altered  mental status, difficulty ambulating and communicating. History of dementia, hypertension, diabetes, hyperlipidemia. EXAM: CT HEAD WITHOUT CONTRAST TECHNIQUE: Contiguous axial images were obtained from the base of the skull through the vertex without intravenous contrast. COMPARISON:  CT head December 22, 2015 FINDINGS: Moderate ventriculomegaly on the basis of global parenchymal brain volume loss, advanced for age. No intraparenchymal hemorrhage, mass effect nor midline shift. Patchy to confluent supratentorial white matter hypodensities. No acute large vascular territory infarcts. 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 No abnormal extra-axial fluid collections. Basal cisterns are patent. Moderate calcific atherosclerosis of the carotid siphons. No skull fracture. The included ocular globes and orbital contents are non-suspicious. Mild paranasal sinus mucosal thickening without air-fluid levels. The mastoid air cells are well aerated. Moderate LEFT temporomandibular osteoarthrosis. IMPRESSION: No acute intracranial process. Stable appearance of the head including moderate global brain atrophy, old RIGHT basal ganglia and thalamus lacunar infarcts. Electronically Signed   By: Elon Alas M.D.   On: 01/02/2016 01:50   Ct Head Wo Contrast  12/22/2015  CLINICAL DATA:  Fall getting out of bed with posterior head laceration. Initial encounter. EXAM: CT HEAD WITHOUT CONTRAST TECHNIQUE: Contiguous axial images were obtained from the base of the skull through the vertex without intravenous contrast. COMPARISON:  05/06/2014 FINDINGS: Skull and Sinuses: Occipital scalp contusion. Negative for fracture. Hypodense secretions layer within left maxillary sinus. Visualized orbits: Negative. Brain: No evidence of acute infarction, hemorrhage, hydrocephalus, or mass lesion/mass effect. Stable pattern of patchy low-density in the cerebral white matter consistent with chronic microvascular ischemia. There have  been remote lacunar infarcts in the right thalamus and deep white matter tracts. Cerebral volume loss with ex vacuo ventricular enlargement, mildly progressed since 2015. IMPRESSION: 1. No acute intracranial finding or fracture. 2. Left maxillary sinus fluid level. 3. Atrophy and chronic small vessel disease. Electronically Signed   By: Monte Fantasia M.D.   On: 12/22/2015 05:33   Ct Abdomen Pelvis W Contrast  01/02/2016  CLINICAL DATA:  75 year old female with right upper quadrant abdominal pain as well as left lower quadrant pain. EXAM: CT ABDOMEN AND PELVIS WITH CONTRAST TECHNIQUE: Multidetector CT imaging of the abdomen and pelvis was performed using the standard protocol following bolus administration of intravenous contrast. CONTRAST:  35mL OMNIPAQUE IOHEXOL 300 MG/ML  SOLN COMPARISON:  CT dated 05/06/2015 FINDINGS: Evaluation of this exam is  limited due to respiratory motion artifact. The visualized lung bases are clear. There is coronary vascular calcification. No intra-abdominal free air or free fluid. Small focal density at the gallbladder fundus may represent a focal adenomyomatosis. A noncalcified stone is less likely not excluded. Ultrasound may provide better evaluation the gallbladder if clinically indicated. There is no pericholecystic fluid. The liver, pancreas, spleen, and adrenal glands appear unremarkable. There is no hydronephrosis or nephrolithiasis on either side. Subcentimeter right renal hypodense lesion is not well characterized but may represent a cyst. The visualized ureters and urinary bladder appear unremarkable. The uterus and ovaries are grossly unremarkable. Evaluation of the bowel is limited in the absence of oral contrast. There is partial sigmoid resection with anastomotic sutures. There is no evidence of bowel obstruction or inflammation. The appendix is not visualized with certainty. No inflammatory changes identified in the right lower quadrant. Advanced aortoiliac  atherosclerotic disease. There is ectasia of the infrarenal abdominal aorta. No portal venous gas identified. There is no adenopathy. The abdominal wall soft tissues appear unremarkable. There is osteopenia with degenerative changes of the spine. Old-appearing compression deformity of the superior endplate of the L5 vertebra No acute fracture. IMPRESSION: No definite acute intra-abdominal or pelvic pathology. Electronically Signed   By: Anner Crete M.D.   On: 01/02/2016 02:01     PERTINENT LAB RESULTS: CBC:  Recent Labs  01/01/16 2314 01/03/16 0524  WBC 10.7* 8.0  HGB 14.9 14.1  HCT 44.0 42.6  PLT 195 159   CMET CMP     Component Value Date/Time   NA 144 01/04/2016 0741   K 5.8* 01/04/2016 0741   CL 103 01/04/2016 0741   CO2 26 01/04/2016 0741   GLUCOSE 206* 01/04/2016 0741   BUN 12 01/04/2016 0741   CREATININE 0.94 01/04/2016 0741   CALCIUM 9.4 01/04/2016 0741   PROT 7.4 01/01/2016 2314   ALBUMIN 3.7 01/01/2016 2314   AST 21 01/01/2016 2314   ALT 13* 01/01/2016 2314   ALKPHOS 73 01/01/2016 2314   BILITOT 0.7 01/01/2016 2314   GFRNONAA 58* 01/04/2016 0741   GFRAA >60 01/04/2016 0741    GFR Estimated Creatinine Clearance: 47.7 mL/min (by C-G formula based on Cr of 0.94). No results for input(s): LIPASE, AMYLASE in the last 72 hours. No results for input(s): CKTOTAL, CKMB, CKMBINDEX, TROPONINI in the last 72 hours. Invalid input(s): POCBNP No results for input(s): DDIMER in the last 72 hours.  Recent Labs  01/02/16 1101  HGBA1C 7.4*   No results for input(s): CHOL, HDL, LDLCALC, TRIG, CHOLHDL, LDLDIRECT in the last 72 hours. No results for input(s): TSH, T4TOTAL, T3FREE, THYROIDAB in the last 72 hours.  Invalid input(s): FREET3 No results for input(s): VITAMINB12, FOLATE, FERRITIN, TIBC, IRON, RETICCTPCT in the last 72 hours. Coags: No results for input(s): INR in the last 72 hours.  Invalid input(s): PT Microbiology: Recent Results (from the past 240  hour(s))  Culture, blood (x 2)     Status: None (Preliminary result)   Collection Time: 01/02/16  8:53 AM  Result Value Ref Range Status   Specimen Description BLOOD RIGHT HAND  Final   Special Requests BOTTLES DRAWN AEROBIC ONLY 5CC  Final   Culture NO GROWTH 1 DAY  Final   Report Status PENDING  Incomplete  Culture, blood (x 2)     Status: None (Preliminary result)   Collection Time: 01/02/16 10:30 AM  Result Value Ref Range Status   Specimen Description BLOOD RIGHT ANTECUBITAL  Final  Special Requests IN PEDIATRIC BOTTLE 1CC  Final   Culture NO GROWTH 1 DAY  Final   Report Status PENDING  Incomplete     BRIEF HOSPITAL COURSE:  Delirium: Suspect related to dementia. No evidence of infection at this point. CT head negative for acute abnormalities. Psychiatry consulted, recommendations were to stop Celexa and start Remeron and Abilify. Doing well, calm and quiet. Follows commands, further optimization of her medications deferred to her outpatient MD's.  Dementia: Chronic issue, now complicated by delirium. Continue Namenda.  Hypokalemia: Repleted-please recheck in 2-3 days.  History of CAD: Appears stable without chest pain or shortness of breath. Continue aspirin, Plavix, metoprolol, Imdur and statin.  Chronic systolic heart failure: Clinically euvolemic. Continue Lasix and metoprolol. Last EF by TTE 2016 was around 45%.  Hypertension: BP controlled-but fluctuating, continue Imdur, metoprolol, Lasix. Follow and optimize in the outpatient setting.   Type 2 diabetes: Metformin held on admission-resume on discharge, Continue SSI  And Lantus. Follow and optimize in the outpatient setting.    GERD: Continue PPI   TODAY-DAY OF DISCHARGE:  Subjective:   [name] today has no headache,no chest abdominal pain,no new weakness tingling or numbness. Remains pleasantly confused.   Objective:   Blood pressure 161/63, pulse 57, temperature 97.9 F (36.6 C), temperature source  Oral, resp. rate 17, height 5\' 3"  (1.6 m), weight 65.3 kg (143 lb 15.4 oz), SpO2 95 %.  Intake/Output Summary (Last 24 hours) at 01/04/16 0953 Last data filed at 01/04/16 [id number]  Gross per 24 hour  Intake    300 ml  Output      0 ml  Net    300 ml   Filed Weights   01/01/16 2219 01/02/16 0830  Weight: 71.215 kg (157 lb) 65.3 kg (143 lb 15.4 oz)    Exam Awake Alert,  No new F.N deficits, Normal affect [place].AT,PERRAL Supple Neck,No JVD, No cervical lymphadenopathy appriciated.  Symmetrical Chest wall movement, Good air movement bilaterally, CTAB RRR,No Gallops,Rubs or new Murmurs, No Parasternal Heave +ve B.Sounds, Abd Soft, Non tender, No organomegaly appriciated, No rebound -guarding or rigidity. No Cyanosis, Clubbing or edema, No new Rash or bruise  DISCHARGE CONDITION: Stable  DISPOSITION: SNF  DISCHARGE INSTRUCTIONS:    Activity:  As tolerated with Full fall precautions use walker/cane & assistance as needed  Get Medicines reviewed and adjusted: Please take all your medications with you for your next visit with your Primary MD  Please request your Primary MD to go over all hospital tests and procedure/radiological results at the follow up, please ask your Primary MD to get all Hospital records sent to his/her office.  If you experience worsening of your admission symptoms, develop shortness of breath, life threatening emergency, suicidal or homicidal thoughts you must seek medical attention immediately by calling 911 or calling your MD immediately  if symptoms less severe.  You must read complete instructions/literature along with all the possible adverse reactions/side effects for all the Medicines you take and that have been prescribed to you. Take any new Medicines after you have completely understood and accpet all the possible adverse reactions/side effects.   Do not drive when taking Pain medications.   Do not take more than prescribed Pain, Sleep and Anxiety  Medications  Special Instructions: If you have smoked or chewed Tobacco  in the last 2 yrs please stop smoking, stop any regular Alcohol  and or any Recreational drug use.  Wear Seat belts while driving.  Please note  You were  cared for by a hospitalist during your hospital stay. Once you are discharged, your primary care physician will handle any further medical issues. Please note that NO REFILLS for any discharge medications will be authorized once you are discharged, as it is imperative that you return to your primary care physician (or establish a relationship with a primary care physician if you do not have one) for your aftercare needs so that they can reassess your need for medications and monitor your lab values.   Diet recommendation: Diabetic Diet Heart Healthy diet  Discharge Instructions    Call MD for:  difficulty breathing, headache or visual disturbances    Complete by:  As directed      Call MD for:  persistant nausea and vomiting    Complete by:  As directed      Call MD for:  severe uncontrolled pain    Complete by:  As directed      Diet - low sodium heart healthy    Complete by:  As directed      Diet Carb Modified    Complete by:  As directed      Discharge instructions    Complete by:  As directed   Check CBG's ac and hs     Increase activity slowly    Complete by:  As directed           Follow-up Information    Follow up with Vidal Schwalbe, MD. Schedule an appointment as soon as possible for a visit in 1 week.   Specialty:  Family Medicine   Why:  Hospital follow up   Contact information:   [address] [place] [address] [phone]       Total Time spent on discharge equals 45 minutes.  SignedOren Binet 01/04/2016 9:53 AM

## 2016-01-04 NOTE — Care Management Note (Signed)
Case Management Note  Patient Details  Name: [name] MRN: [id number] Date of Birth: 10/11/1941  Subjective/Objective:           CM following for progression and d/c planning.         Action/Plan: 01/04/2016 Met with pt and granddaughter on 01/03/2016 re d/c plan. Family wishes for pt to d/c to SNF . CSW working with family and plan to d/c to SNF today, 01/04/2016.   Expected Discharge Date:    01/04/2016              Expected Discharge Plan:  Skilled Nursing Facility  In-House Referral:  Clinical Social Work  Discharge planning Services  NA  Post Acute Care Choice:  NA Choice offered to:  NA  DME Arranged:  N/A DME Agency:  NA  HH Arranged:  NA HH Agency:  NA  Status of Service:  Completed, signed off  Medicare Important Message Given:    Date Medicare IM Given:    Medicare IM give by:    Date Additional Medicare IM Given:    Additional Medicare Important Message give by:     If discussed at [place] of Stay Meetings, dates discussed:    Additional Comments:  Adron Bene, RN 01/04/2016, 12:52 PM

## 2016-01-04 NOTE — Progress Notes (Signed)
SW Butch Penny said that patient cannot be discharged today as she is waiting to hear from the facility.

## 2016-01-04 NOTE — Clinical Social Work Placement (Signed)
   CLINICAL SOCIAL WORK PLACEMENT  NOTE  Date:  01/04/2016  Patient Details  Name: [name] MRN: [id number] Date of Birth: November 27, 1940  Clinical Social Work is seeking post-discharge placement for this patient at the [place] level of care (*CSW will initial, date and re-position this form in  chart as items are completed):  Yes   Patient/family provided with [facility] Work Department's list of facilities offering this level of care within the geographic area requested by the patient (or if unable, by the patient's family).  Yes   Patient/family informed of their freedom to choose among providers that offer the needed level of care, that participate in Medicare, Medicaid or managed care program needed by the patient, have an available bed and are willing to accept the patient.  Yes   Patient/family informed of [facility]'s ownership interest in [facility] and [facility], as well as of the fact that they are under no obligation to receive care at these facilities.  PASRR submitted to EDS on 01/03/16     PASRR number received on 01/03/16     Existing PASRR number confirmed on       FL2 transmitted to all facilities in geographic area requested by pt/family on 01/03/16     FL2 transmitted to all facilities within larger geographic area on       Patient informed that his/her managed care company has contracts with or will negotiate with certain facilities, including the following:        Yes   Patient/family informed of bed offers received.  Patient chooses bed at [facility]     Physician recommends and patient chooses bed at      Patient to be transferred to   on  .  Patient to be transferred to facility by       Patient family notified on   of transfer.  Name of family member notified:        PHYSICIAN Please sign FL2, Please prepare priority discharge summary, including medications     Additional Comment:     Barbette Or, [place]

## 2016-01-05 LAB — GLUCOSE, CAPILLARY
GLUCOSE-CAPILLARY: 139 mg/dL — AB (ref 65–99)
Glucose-Capillary: 154 mg/dL — ABNORMAL HIGH (ref 65–99)
Glucose-Capillary: 179 mg/dL — ABNORMAL HIGH (ref 65–99)

## 2016-01-05 NOTE — Consult Note (Addendum)
[facility] Face-to-Face Psychiatry Consult follow-up  Reason for Consult:  Dementia with psychosis Referring Physician:  Dr. 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 Lactic acidosis [E87.2] 01/02/2016  . Encephalopathy acute [G93.40] 01/02/2016  . Acute encephalopathy [G93.40] 01/02/2016  . Senile dementia without behavioral disturbance [F03.90] 10/06/2015  . UTI (urinary tract infection) [N39.0] 10/06/2015  . Obesity (BMI 30-39.9) [E66.9] 10/06/2015  . COLD (chronic obstructive lung disease) ([place]) [J44.9] 07/22/2015  . Tobacco use disorder [F17.200] 03/17/2015  . Cardiomyopathy, ischemic [I25.5] 03/17/2015  . Allergy to ACE inhibitors [Z88.8] 03/17/2015  . Intolerance of drug [Z88.9] 03/17/2015  . Ventricular tachycardia ([place]) [I47.2] 03/17/2015  . Chest pain [R07.9] 03/16/2015  . HLD (hyperlipidemia) [E78.5] 12/31/2014  . COPD (chronic obstructive pulmonary disease) ([place]) [J44.9] 12/31/2014  . HCAP (healthcare-associated pneumonia) [J18.9] 12/31/2014  . Diabetes mellitus without complication ([place]) [T05.6]   . Atelectasis [J98.11] 01/23/2014  . Chronic systolic heart failure ([place]) [I50.22] 10/12/2013  . Acute diastolic CHF (congestive heart failure) ([place]) [I50.31] 10/10/2013  . Hypomagnesemia [E83.42] 10/10/2013  . Chronic cough [R05] 09/12/2013  . Acute respiratory failure ([place]) [J96.00] 05/20/2013  . Memory deficit [R41.3] 04/23/2013  . Essential hypertension, benign [I10] 04/03/2013  . Dyslipidemia [E78.5] 04/03/2013  . COPD exacerbation ([place]) [J44.1] 04/03/2013  . Coronary atherosclerosis of native coronary artery [I25.10] 04/03/2013  . DM2 (diabetes mellitus, type 2) ([place]) [E11.9] 04/02/2013  . Hyperglycemia [R73.9] 04/02/2013  . Hypokalemia [E87.6] 04/02/2013  . Fibromyalgia [M79.7] 04/20/2008    Total Time spent with  patient: 20 minutes  Subjective:   [name] is a 75 y.o. female patient admitted with dementia with psychosis.  HPI:  [name] is a 75 y.o. female seen, chart reviewed for face-to-face psychiatric consultation and evaluation of increased symptoms of dementia, agitation, aggression and psychosis. Reportedly patient presented with acute onset of confusion and recent history of fall at home and required emergency evaluation at the [facility]. Reportedly CT scan of brain was negative at that time. Patient is a poor historian as she does not remember basic information about her current clinical situation and living arrangements. Patient granddaughter was at bedside reportedly came from [place] staying with her for the last 2 weeks secondary to deterioration of her health condition. One of the patient daughter is her legal guardian and power of attorney who cannot care for her at home because of her own high needy children, but continue to be supportive. Patient was not able to walk without support, unable to take showers reportedly broke the shower 2 days ago. Patient has been receiving paroxetine from primary care physician Harlan Stains, M.D. I would like to discontinue her medication which causes sedation paroxetine, lorazepam and Depakote as she has prone for falls. Will restart Abilify 2 mg twice daily for agitation and confusion associated with dementia and lack of sleep (QTC prolongation is 490 as of current EKG) and Remeron Disintegrating tablet 15 mg at bedtime for insomnia. Patient has no suicidal/homicidal ideation.   Medical history: Patient with a history of CAD,ICM,T2 DM, dementia, sleep apnea, and other medical issues listed below, presenting to the emergency department with acute onset of confusion. She was having difficulties ambulating prior to these events, and some speech difficulty, Repaired gravely say "help", which quickly resolved.low grade fever, nausea vomiting or  vertigo was reported by family. She did fall about 2 weeks  ago, hitting the occipital area without losing consciousness, at which time she was seen at the emergency department, at which time a CT on 12/22/2015 was negative for acute intracranial findings or fractures.Grandaughter reports that the patient has been increasingly weaker, essentially bed-bound, unable to feed herself, feels that she may have forgotten how to eat, bathe, and other activities of daily living.The patient lives alone, and family is unable to assess the patient in the interim.  Past Psychiatric History: Patient has been diagnosed with a dementia and has been receiving medication management from primary physician. Patient has no history of acute psychiatric hospitalization.   Interval history: Patient seen today for psychiatric consultation follow-up. Patient has been taking her medication Abilify and Remeron as prescribed and has no reported side effects. Patient and staff nurse reported she has no irritability, agitation or combative behaviors and does not seem to be responding to internal stimuli or hallucinations. Patient appeared staying in her bed calm and cooperative, she is awake, alert but not oriented to her surroundings. Patient has moderate cognitive deficits secondary to dementia. Patient has no family members during this visit. Patient could not provide any information regarding family.  Risk to Self: Is patient at risk for suicide?: No Risk to Others:   Prior Inpatient Therapy:   Prior Outpatient Therapy:    Past Medical History:  Past Medical History  Diagnosis Date  . CAD (coronary artery disease)     a. s/p inferior STEMI 12/29/10 with rotablator atherectomy RCA 12/31/10 and DES x 2 RCA;  b. Lexiscan Myoview (02/2015): mild reversible apical anterior perfusion defect. C. cath 02/2015 50% LAD lesion with patent stent, Tx Rx    . Ischemic cardiomyopathy     EF 45%cath 2012 EF55% 5/12; 35% 11/14  . Diabetes  mellitus     type 2  . HTN (hypertension)   . Hyperlipidemia   . Diverticulosis   . Respiratory arrest//ACE Inhibitor presumed cause   . COPD (chronic obstructive pulmonary disease) ([place])   . Small bowel obstruction (HCC)     from a sigmoid stricture  . Nephrolithiasis   . Sleep apnea   . Overweight(278.02)   . Osteoarthritis   . GERD (gastroesophageal reflux disease)   . Anxiety   . Glaucoma   . Memory deficit   . Dementia     Past Surgical History  Procedure Laterality Date  . Bilateral tubal ligation    . Lithotripsy    . 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 Cardiac surgery    . Cardiac catheterization N/A 03/18/2015    Procedure: LEFT HEART CATH AND CORONARY ANGIOGRAPHY;  Surgeon: Lorretta Harp, MD;  Location: [facility] CATH LAB;  Service: Cardiovascular;  Laterality: N/A;   Family History:  Family History  Problem Relation Age of Onset  . Breast cancer Maternal Grandmother   . Diabetes Father   . Heart disease Father   . Diabetes Sister   . Colon cancer Neg Hx   . Heart attack Father   . Hypertension Sister   . Stroke Mother    Family Psychiatric  History: not significant for mental illness Social History:  History  Alcohol Use No     History  Drug Use No    Social History   Social History  . Marital Status: Widowed    Spouse Name: N/A  . Number of Children: 2  . Years of Education: 11   Occupational History  . Retired  Haematologist    Social History Main Topics  . Smoking status: Current Every Day Smoker -- 1.00 packs/day for 25 years    Types: Cigarettes  . Smokeless tobacco: Never Used     Comment: over a ppd for 40+ years; quit 10/2004.  unsuccessfully tried eCigs.  . Alcohol Use: No  . Drug Use: No  . Sexual Activity: No   Other Topics Concern  . None   Social History Narrative   Patient is widowed with 2 children.   Patient is right handed.   Patient has 11 th grade education.    Patient drinks 6-7 daily.   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     Allergies:   Allergies  Allergen Reactions  . Lisinopril Other (See Comments)    REACTION: respiratory arrest jan 2009  . Spironolactone Other (See Comments)    PATIENT HAD SEVERE SIDE EFFECTS  . Donepezil Diarrhea  . Eggs Or Egg-Derived Products Diarrhea  . Erythromycin Other (See Comments)    Makes me feel weird   . Macrolides And Ketolides Other (See Comments)  . Penicillins Itching and Other (See Comments)    Too much as a kid  . Quinapril Hcl Itching and Other (See Comments)    Too much AS A KID  . Angiotensin Receptor Blockers Other (See Comments)    unknown  . Lipitor [Atorvastatin] Palpitations    FAST HEART RATE  . Pentazocine Lactate Other (See Comments)    Reaction unknown  . Propoxyphene Hcl Other (See Comments)    Reaction unknown  . Sulfonamide Derivatives Other (See Comments)    Childhood allergy  . Vancomycin Rash    CLOSES THROAT. ANY MYCIN FAMILY    Labs:  Results for orders placed or performed during the hospital encounter of 01/01/16 (from the past 48 hour(s))  Glucose, capillary     Status: Abnormal   Collection Time: 01/03/16 11:33 AM  Result Value Ref Range   Glucose-Capillary 232 (H) 65 - 99 mg/dL  Glucose, capillary     Status: Abnormal   Collection Time: 01/03/16  5:51 PM  Result Value Ref Range   Glucose-Capillary 172 (H) 65 - 99 mg/dL  Glucose, capillary     Status: Abnormal   Collection Time: 01/03/16 10:37 PM  Result Value Ref Range   Glucose-Capillary 165 (H) 65 - 99 mg/dL  Glucose, capillary     Status: Abnormal   Collection Time: 01/04/16  7:34 AM  Result Value Ref Range   Glucose-Capillary 184 (H) 65 - 99 mg/dL  Magnesium     Status: None   Collection Time: 01/04/16  7:41 AM  Result Value Ref Range   Magnesium 2.0 1.7  - 2.4 mg/dL  Basic metabolic panel     Status: Abnormal   Collection Time: 01/04/16  7:41 AM  Result Value Ref Range   Sodium 144 135 - 145 mmol/L   Potassium 5.8 (H) 3.5 - 5.1 mmol/L   Chloride 103 101 - 111 mmol/L   CO2 26 22 - 32 mmol/L   Glucose, Bld 206 (H) 65 - 99 mg/dL   BUN 12 6 - 20 mg/dL   Creatinine, Ser 0.94 0.44 - 1.00 mg/dL   Calcium 9.4 8.9 - 10.3 mg/dL   GFR calc non Af Amer 58 (L) >60 mL/min   GFR calc Af  Amer >60 >60 mL/min    Comment: (NOTE) The eGFR has been calculated using the CKD EPI equation. This calculation has not been validated in all clinical situations. eGFR's persistently <60 mL/min signify possible Chronic Kidney Disease.    Anion gap 15 5 - 15  Glucose, capillary     Status: Abnormal   Collection Time: 01/04/16 11:46 AM  Result Value Ref Range   Glucose-Capillary 189 (H) 65 - 99 mg/dL  Potassium     Status: None   Collection Time: 01/04/16  2:49 PM  Result Value Ref Range   Potassium 4.7 3.5 - 5.1 mmol/L    Comment: DELTA CHECK NOTED NO VISIBLE HEMOLYSIS   Glucose, capillary     Status: Abnormal   Collection Time: 01/04/16  5:23 PM  Result Value Ref Range   Glucose-Capillary 148 (H) 65 - 99 mg/dL  Glucose, capillary     Status: Abnormal   Collection Time: 01/04/16 10:09 PM  Result Value Ref Range   Glucose-Capillary 134 (H) 65 - 99 mg/dL  Glucose, capillary     Status: Abnormal   Collection Time: 01/05/16  7:36 AM  Result Value Ref Range   Glucose-Capillary 139 (H) 65 - 99 mg/dL    Current Facility-Administered Medications  Medication Dose Route Frequency Provider Last Rate Last Dose  . acetaminophen (TYLENOL) tablet 650 mg  650 mg Oral Q6H PRN Rondel Jumbo, PA-C       Or  . acetaminophen (TYLENOL) suppository 650 mg  650 mg Rectal Q6H PRN Rondel Jumbo, PA-C      . albuterol (PROVENTIL) (2.5 MG/3ML) 0.083% nebulizer solution 2.5 mg  2.5 mg Nebulization QID Rondel Jumbo, PA-C   2.5 mg at 01/05/16 0730  . ARIPiprazole (ABILIFY)  tablet 2 mg  2 mg Oral BID Ambrose Finland, MD   2 mg at 01/05/16 0092  . aspirin EC tablet 81 mg  81 mg Oral Daily Rondel Jumbo, PA-C   81 mg at 01/04/16 3300  . budesonide (PULMICORT) nebulizer solution 0.25 mg  0.25 mg Nebulization BID Rondel Jumbo, PA-C   0.25 mg at 01/05/16 0730  . clopidogrel (PLAVIX) tablet 75 mg  75 mg Oral Daily Rondel Jumbo, PA-C   75 mg at 01/04/16 7622  . enoxaparin (LOVENOX) injection 40 mg  40 mg Subcutaneous Q24H Wendee Beavers, RPH   40 mg at 01/04/16 1757  . furosemide (LASIX) tablet 20 mg  20 mg Oral Daily Rondel Jumbo, PA-C   20 mg at 01/04/16 6333  . HYDROcodone-acetaminophen (NORCO/VICODIN) 5-325 MG per tablet 1 tablet  1 tablet Oral Q4H PRN Norval Morton, MD   1 tablet at 01/04/16 0845  . HYDROmorphone (DILAUDID) injection 0.25 mg  0.25 mg Intravenous Q4H PRN Norval Morton, MD      . insulin aspart (novoLOG) injection 0-9 Units  0-9 Units Subcutaneous TID WC Rondel Jumbo, PA-C   1 Units at 01/04/16 1758  . insulin glargine (LANTUS) injection 8 Units  8 Units Subcutaneous QHS Jonetta Osgood, MD   8 Units at 01/04/16 2220  . isosorbide mononitrate (IMDUR) 24 hr tablet 15 mg  15 mg Oral Daily Rondel Jumbo, PA-C   15 mg at 01/04/16 5456  . memantine (NAMENDA XR) 24 hr capsule 28 mg  28 mg Oral Daily Rondel Jumbo, PA-C   28 mg at 01/04/16 1000  . metoprolol succinate (TOPROL-XL) 24 hr tablet 25 mg  25 mg Oral Daily  Rondel Jumbo, PA-C   25 mg at 01/04/16 5697  . mirtazapine (REMERON SOL-TAB) disintegrating tablet 15 mg  15 mg Oral QHS Ambrose Finland, MD   15 mg at 01/04/16 2359  . ondansetron (ZOFRAN) tablet 4 mg  4 mg Oral Q6H PRN Rondel Jumbo, PA-C       Or  . ondansetron [facility]) injection 4 mg  4 mg Intravenous Q6H PRN Rondel Jumbo, PA-C   4 mg at 01/03/16 1419  . pantoprazole (PROTONIX) EC tablet 40 mg  40 mg Oral Daily Rondel Jumbo, PA-C   40 mg at 01/04/16 9480  . pravastatin (PRAVACHOL) tablet 40 mg  40 mg Oral  q1800 Rondel Jumbo, PA-C   40 mg at 01/04/16 1757    Musculoskeletal: Strength & Muscle Tone: decreased Gait & Station: unsteady Patient leans: N/A  Psychiatric Specialty Exam: ROS   Blood pressure 156/67, pulse 70, temperature 97.8 F (36.6 C), temperature source Oral, resp. rate 17, height '5\' 3"'  (1.6 m), weight 65.3 kg (143 lb 15.4 oz), SpO2 97 %.Body mass index is 25.51 kg/(m^2).  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 while sitting in a chair in the hospital room  Thought Content:  Response "I do not know" for most of the questions  Suicidal Thoughts:  No  Homicidal Thoughts:  No  Memory:  Immediate;   Fair Recent;   Poor  Judgement:  Impaired  Insight:  Shallow  Psychomotor Activity:  Decreased  Concentration:  Poor  Recall:  Poor  Fund of Knowledge:Fair  Language: Fair  Akathisia:  Negative  Handed:  Right  AIMS (if indicated):     Assets:  Catering manager Housing Leisure Time Social Support Transportation  ADL's:  Impaired  Cognition: Impaired,  Moderate  Sleep:      Treatment Plan Summary: Patient has been suffering with significant cognitive deficits secondary to dementia, history of recent fall, unsteady on her feet, need walker to support her and idiopathic insomnia.  Continue Abilify 2 mg twice daily for agitation associated with the dementia  Continue Remeron soluble tablets 15 mg daily at bedtime for insomnia Discontinue Depakote, Ativan and Paxil secondary to sedative affect and risk of fall Appreciate psychiatric consultation and and we sign off at this time  Please contact [phone] or [phone] if needs further assistance  Disposition: Patient benefit from the skilled nursing facility as she  cannot care for herself and needs 24-hour supervision  Patient does not meet criteria for psychiatric inpatient admission. Supportive therapy provided about ongoing stressors.  Durward Parcel., MD 01/05/2016 8:57 AM

## 2016-01-05 NOTE — Progress Notes (Signed)
Pt for d/c to [facility] today. [facility] received [id number]. Per Santiago Glad at [facility], they are able to accept pt today. DC summary and signed FL2 sent via hub. CSW made pt's dtr Benjamine Mola aware of d/c and reports she will meet pt at facility. [facility] arranged for transport. CSW signing off.   Wandra Feinstein, MSW, LCSW

## 2016-01-05 NOTE — Progress Notes (Signed)
Discharge report called to [facility] at [facility]  All questions answered.

## 2016-01-05 NOTE — Progress Notes (Signed)
Patient has not voided this shift,patient denies any urge to urinate.Bladder scan performed and showed 335 cc.Text page sent to MD on call,awaiting call back. [name], Wonda Cheng, Therapist, sports

## 2016-01-05 NOTE — Progress Notes (Signed)
Patient was able to urinate 175 cc. [name], Wonda Cheng, Therapist, sports

## 2016-01-07 ENCOUNTER — Telehealth: Payer: Self-pay | Admitting: Neurology

## 2016-01-07 LAB — CULTURE, BLOOD (ROUTINE X 2)
Culture: NO GROWTH
Culture: NO GROWTH

## 2016-01-07 NOTE — Telephone Encounter (Signed)
Patient's daughter is calling. She states her mother's health has declined in the past 2 months. She still recognizes her family but not the house she lives in. She was in the hospital over the weekend and now she is temporarily in a nursing home until they can find a permanent one for the patient. The patient cannot live alone. The daughter would like a call to discuss her mother's condition. The patient had an appointment scheduled 01-12-16 for a follow up but her daughter cancelled it.
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
## 2016-01-12 ENCOUNTER — Ambulatory Visit: Payer: Medicare Other | Admitting: Neurology

## 2016-01-19 ENCOUNTER — Ambulatory Visit: Payer: Medicare Other | Admitting: Cardiovascular Disease

## 2016-02-04 ENCOUNTER — Emergency Department (HOSPITAL_COMMUNITY): Payer: Medicare Other

## 2016-02-04 ENCOUNTER — Observation Stay (HOSPITAL_COMMUNITY)
Admission: EM | Admit: 2016-02-04 | Discharge: 2016-02-06 | Disposition: A | Discharge status: Home / Self Care | Payer: Medicare Other | Attending: Internal Medicine | Admitting: Internal Medicine

## 2016-02-04 ENCOUNTER — Encounter (HOSPITAL_COMMUNITY): Payer: Self-pay | Admitting: Emergency Medicine

## 2016-02-04 DIAGNOSIS — J441 Chronic obstructive pulmonary disease with (acute) exacerbation: Secondary | ICD-10-CM | POA: Insufficient documentation

## 2016-02-04 DIAGNOSIS — I255 Ischemic cardiomyopathy: Secondary | ICD-10-CM | POA: Insufficient documentation

## 2016-02-04 DIAGNOSIS — K5790 Diverticulosis of intestine, part unspecified, without perforation or abscess without bleeding: Secondary | ICD-10-CM | POA: Diagnosis not present

## 2016-02-04 DIAGNOSIS — K5669 Other intestinal obstruction: Secondary | ICD-10-CM | POA: Diagnosis not present

## 2016-02-04 DIAGNOSIS — K219 Gastro-esophageal reflux disease without esophagitis: Secondary | ICD-10-CM | POA: Insufficient documentation

## 2016-02-04 DIAGNOSIS — M1990 Unspecified osteoarthritis, unspecified site: Secondary | ICD-10-CM | POA: Diagnosis not present

## 2016-02-04 DIAGNOSIS — F0390 Unspecified dementia without behavioral disturbance: Secondary | ICD-10-CM | POA: Diagnosis not present

## 2016-02-04 DIAGNOSIS — R079 Chest pain, unspecified: Secondary | ICD-10-CM | POA: Diagnosis present

## 2016-02-04 DIAGNOSIS — N200 Calculus of kidney: Secondary | ICD-10-CM | POA: Insufficient documentation

## 2016-02-04 DIAGNOSIS — E119 Type 2 diabetes mellitus without complications: Secondary | ICD-10-CM | POA: Diagnosis not present

## 2016-02-04 DIAGNOSIS — E785 Hyperlipidemia, unspecified: Secondary | ICD-10-CM | POA: Diagnosis not present

## 2016-02-04 DIAGNOSIS — I2510 Atherosclerotic heart disease of native coronary artery without angina pectoris: Secondary | ICD-10-CM | POA: Diagnosis not present

## 2016-02-04 DIAGNOSIS — Z880 Allergy status to penicillin: Secondary | ICD-10-CM | POA: Diagnosis not present

## 2016-02-04 DIAGNOSIS — I10 Essential (primary) hypertension: Secondary | ICD-10-CM | POA: Diagnosis not present

## 2016-02-04 DIAGNOSIS — H409 Unspecified glaucoma: Secondary | ICD-10-CM | POA: Insufficient documentation

## 2016-02-04 DIAGNOSIS — Z7982 Long term (current) use of aspirin: Secondary | ICD-10-CM | POA: Diagnosis not present

## 2016-02-04 DIAGNOSIS — Z794 Long term (current) use of insulin: Secondary | ICD-10-CM | POA: Diagnosis not present

## 2016-02-04 DIAGNOSIS — F419 Anxiety disorder, unspecified: Secondary | ICD-10-CM | POA: Diagnosis not present

## 2016-02-04 DIAGNOSIS — E663 Overweight: Secondary | ICD-10-CM | POA: Diagnosis not present

## 2016-02-04 DIAGNOSIS — Z79899 Other long term (current) drug therapy: Secondary | ICD-10-CM | POA: Diagnosis not present

## 2016-02-04 DIAGNOSIS — J449 Chronic obstructive pulmonary disease, unspecified: Secondary | ICD-10-CM | POA: Diagnosis present

## 2016-02-04 LAB — CBC
HEMATOCRIT: 42.2 % (ref 36.0–46.0)
HEMOGLOBIN: 14 g/dL (ref 12.0–15.0)
MCH: 32.2 pg (ref 26.0–34.0)
MCHC: 33.2 g/dL (ref 30.0–36.0)
MCV: 97 fL (ref 78.0–100.0)
Platelets: 151 10*3/uL (ref 150–400)
RBC: 4.35 MIL/uL (ref 3.87–5.11)
RDW: 13.1 % (ref 11.5–15.5)
WBC: 10.8 10*3/uL — ABNORMAL HIGH (ref 4.0–10.5)

## 2016-02-04 LAB — BASIC METABOLIC PANEL
ANION GAP: 13 (ref 5–15)
BUN: 18 mg/dL (ref 6–20)
CALCIUM: 8.9 mg/dL (ref 8.9–10.3)
CO2: 25 mmol/L (ref 22–32)
Chloride: 105 mmol/L (ref 101–111)
Creatinine, Ser: 0.83 mg/dL (ref 0.44–1.00)
Glucose, Bld: 172 mg/dL — ABNORMAL HIGH (ref 65–99)
POTASSIUM: 4 mmol/L (ref 3.5–5.1)
Sodium: 143 mmol/L (ref 135–145)

## 2016-02-04 LAB — URINALYSIS, ROUTINE W REFLEX MICROSCOPIC
Bilirubin Urine: NEGATIVE
Glucose, UA: 100 mg/dL — AB
Ketones, ur: NEGATIVE mg/dL
Nitrite: NEGATIVE
PROTEIN: NEGATIVE mg/dL
SPECIFIC GRAVITY, URINE: 1.024 (ref 1.005–1.030)
pH: 7 (ref 5.0–8.0)

## 2016-02-04 LAB — CBG MONITORING, ED: Glucose-Capillary: 191 mg/dL — ABNORMAL HIGH (ref 65–99)

## 2016-02-04 LAB — GLUCOSE, CAPILLARY
Glucose-Capillary: 159 mg/dL — ABNORMAL HIGH (ref 65–99)
Glucose-Capillary: 216 mg/dL — ABNORMAL HIGH (ref 65–99)

## 2016-02-04 LAB — URINE MICROSCOPIC-ADD ON

## 2016-02-04 LAB — I-STAT TROPONIN, ED: TROPONIN I, POC: 0 ng/mL (ref 0.00–0.08)

## 2016-02-04 LAB — TROPONIN I: Troponin I: <0.03 ng/mL (ref <0.031)

## 2016-02-04 MED ORDER — GI COCKTAIL ~~LOC~~: 30.0000 mL | Freq: Four times a day (QID) | ORAL | Status: DC | PRN

## 2016-02-04 MED ORDER — INSULIN ASPART 100 UNIT/ML ~~LOC~~ SOLN
0.0000 [IU] | Freq: Three times a day (TID) | SUBCUTANEOUS | Status: DC
Start: 2016-02-04 — End: 2016-02-06
  Administered 2016-02-04 – 2016-02-05 (×4): 2 [IU] via SUBCUTANEOUS
  Administered 2016-02-05: 3 [IU] via SUBCUTANEOUS
  Administered 2016-02-06: 2 [IU] via SUBCUTANEOUS
  Filled 2016-02-04: qty 1

## 2016-02-04 MED ORDER — ISOSORBIDE MONONITRATE ER 30 MG PO TB24
15.0000 mg | ORAL_TABLET | Freq: Every day | ORAL | Status: DC
  Administered 2016-02-04 – 2016-02-06 (×3): 15 mg via ORAL
  Filled 2016-02-04 (×3): qty 1

## 2016-02-04 MED ORDER — ENOXAPARIN SODIUM 40 MG/0.4ML ~~LOC~~ SOLN
40.0000 mg | Freq: Every day | SUBCUTANEOUS | Status: DC
  Administered 2016-02-05 – 2016-02-06 (×2): 40 mg via SUBCUTANEOUS
  Filled 2016-02-04 (×3): qty 0.4

## 2016-02-04 MED ORDER — PRAVASTATIN SODIUM 40 MG PO TABS: 40.0000 mg | ORAL_TABLET | Freq: Every day | ORAL | Status: DC

## 2016-02-04 MED ORDER — MIRTAZAPINE 15 MG PO TBDP
15.0000 mg | ORAL_TABLET | Freq: Every day | ORAL | Status: DC
  Administered 2016-02-04 – 2016-02-05 (×2): 15 mg via ORAL
  Filled 2016-02-04 (×2): qty 1

## 2016-02-04 MED ORDER — MEMANTINE HCL ER 28 MG PO CP24
28.0000 mg | ORAL_CAPSULE | Freq: Every day | ORAL | Status: DC
  Administered 2016-02-04 – 2016-02-06 (×3): 28 mg via ORAL
  Filled 2016-02-04 (×5): qty 1

## 2016-02-04 MED ORDER — ASPIRIN EC 81 MG PO TBEC
81.0000 mg | DELAYED_RELEASE_TABLET | Freq: Every day | ORAL | Status: DC
  Administered 2016-02-04 – 2016-02-06 (×3): 81 mg via ORAL
  Filled 2016-02-04 (×3): qty 1

## 2016-02-04 MED ORDER — LORAZEPAM 0.5 MG PO TABS: 0.5000 mg | ORAL_TABLET | Freq: Two times a day (BID) | ORAL | Status: DC | PRN

## 2016-02-04 MED ORDER — OXYCODONE-ACETAMINOPHEN 5-325 MG PO TABS: 1.0000 | ORAL_TABLET | Freq: Four times a day (QID) | ORAL | Status: DC | PRN

## 2016-02-04 MED ORDER — MORPHINE SULFATE (PF) 2 MG/ML IV SOLN: 2.0000 mg | INTRAVENOUS | Status: DC | PRN

## 2016-02-04 MED ORDER — ARIPIPRAZOLE 2 MG PO TABS
2.0000 mg | ORAL_TABLET | Freq: Two times a day (BID) | ORAL | Status: DC
  Administered 2016-02-04 – 2016-02-06 (×4): 2 mg via ORAL
  Filled 2016-02-04 (×5): qty 1

## 2016-02-04 MED ORDER — MOMETASONE FURO-FORMOTEROL FUM 100-5 MCG/ACT IN AERO: 2.0000 | INHALATION_SPRAY | Freq: Two times a day (BID) | RESPIRATORY_TRACT | Status: DC

## 2016-02-04 MED ORDER — FUROSEMIDE 20 MG PO TABS
20.0000 mg | ORAL_TABLET | Freq: Every day | ORAL | Status: DC
  Administered 2016-02-04 – 2016-02-06 (×3): 20 mg via ORAL
  Filled 2016-02-04 (×3): qty 1

## 2016-02-04 MED ORDER — CLOPIDOGREL BISULFATE 75 MG PO TABS
75.0000 mg | ORAL_TABLET | Freq: Every day | ORAL | Status: DC
  Administered 2016-02-04 – 2016-02-06 (×3): 75 mg via ORAL
  Filled 2016-02-04 (×3): qty 1

## 2016-02-04 MED ORDER — ALBUTEROL SULFATE (2.5 MG/3ML) 0.083% IN NEBU
2.5000 mg | INHALATION_SOLUTION | Freq: Four times a day (QID) | RESPIRATORY_TRACT | Status: DC
  Administered 2016-02-04 – 2016-02-06 (×8): 2.5 mg via RESPIRATORY_TRACT
  Filled 2016-02-04 (×10): qty 3

## 2016-02-04 MED ORDER — METOPROLOL SUCCINATE ER 25 MG PO TB24
25.0000 mg | ORAL_TABLET | Freq: Every day | ORAL | Status: DC
  Administered 2016-02-04 – 2016-02-06 (×3): 25 mg via ORAL
  Filled 2016-02-04 (×3): qty 1

## 2016-02-04 MED ORDER — INSULIN GLARGINE 100 UNIT/ML ~~LOC~~ SOLN
10.0000 [IU] | Freq: Every day | SUBCUTANEOUS | Status: DC
  Administered 2016-02-04 – 2016-02-05 (×2): 10 [IU] via SUBCUTANEOUS
  Filled 2016-02-04 (×3): qty 0.1

## 2016-02-04 MED ORDER — PRAVASTATIN SODIUM 40 MG PO TABS
40.0000 mg | ORAL_TABLET | Freq: Every day | ORAL | Status: DC
  Administered 2016-02-04 – 2016-02-05 (×2): 40 mg via ORAL
  Filled 2016-02-04 (×2): qty 1

## 2016-02-04 MED ORDER — ONDANSETRON HCL 4 MG/2ML IJ SOLN: 4.0000 mg | Freq: Four times a day (QID) | INTRAMUSCULAR | Status: DC | PRN

## 2016-02-04 MED ORDER — ACETAMINOPHEN 325 MG PO TABS: 650.0000 mg | ORAL_TABLET | ORAL | Status: DC | PRN

## 2016-02-04 MED ORDER — BUDESONIDE 0.25 MG/2ML IN SUSP
0.2500 mg | Freq: Two times a day (BID) | RESPIRATORY_TRACT | Status: DC
  Administered 2016-02-04 – 2016-02-06 (×4): 0.25 mg via RESPIRATORY_TRACT
  Filled 2016-02-04 (×5): qty 2

## 2016-02-04 MED ORDER — PANTOPRAZOLE SODIUM 40 MG PO TBEC
40.0000 mg | DELAYED_RELEASE_TABLET | Freq: Every day | ORAL | Status: DC
  Administered 2016-02-04 – 2016-02-06 (×3): 40 mg via ORAL
  Filled 2016-02-04 (×3): qty 1

## 2016-02-04 MED ORDER — NITROGLYCERIN 0.4 MG SL SUBL: 0.4000 mg | SUBLINGUAL_TABLET | SUBLINGUAL | Status: DC | PRN

## 2016-02-04 MED ORDER — IPRATROPIUM BROMIDE 0.02 % IN SOLN: 0.5000 mg | Freq: Four times a day (QID) | RESPIRATORY_TRACT | Status: DC | PRN

## 2016-02-04 NOTE — H&P (Addendum)
[facility] History and Physical  CAITLYNNE TOW [id number] DOB: [birth year] DOA: 02/04/2016  Referring physician: ED PCP: Vidal Schwalbe, MD   Chief Complaint: Chest pain   HPI:  [name] is a 75 year old female with a past medical history significant for CAD, ischemic cardiomyopathy, HTN, HLD, COPD, GERD, anxiety, dementia; who presents with complaints of shortness of breath and chest pain. Patient is a resident of [facility] and was noted to have complaints of chest pain with nausea and what she reports as a little spitting up episode earlier in the day. However, early yesterday night and complaints of shortness of breath and reported some right-sided chest pain/pressure for which EMS was called. Patient had received 324 mg of aspirin and was given one nitroglycerin with resolution of pain symptoms. Upon admission patient was evaluated and seen to have EKG similar to previous initial troponin negative.   Review of Systems  Constitutional: Positive for diaphoresis. Negative for fever and chills.  HENT: Positive for hearing loss. Negative for ear pain.   Eyes: Negative for photophobia and pain.  Respiratory: Positive for shortness of breath.   Cardiovascular: Positive for chest pain. Negative for leg swelling.  Gastrointestinal: Positive for nausea and vomiting.  Genitourinary: Negative for urgency and frequency.  Musculoskeletal: Positive for joint pain. Negative for falls.  Skin: Negative for itching and rash.  Neurological: Positive for dizziness. Negative for seizures and loss of consciousness.  Psychiatric/Behavioral: Positive for memory loss. The patient is nervous/anxious.      Past Medical History  Diagnosis Date  . CAD (coronary artery disease)     a. s/p inferior STEMI 12/29/10 with rotablator atherectomy RCA 12/31/10 and DES x 2 RCA;  b. Lexiscan Myoview (02/2015): mild reversible apical anterior perfusion defect. C. cath 02/2015 50% LAD lesion with patent  stent, Tx Rx    . Ischemic cardiomyopathy     EF 45%cath 2012 EF55% 5/12; 35% 11/14  . Diabetes mellitus     type 2  . HTN (hypertension)   . Hyperlipidemia   . Diverticulosis   . Respiratory arrest//ACE Inhibitor presumed cause   . COPD (chronic obstructive pulmonary disease) ([place])   . Small bowel obstruction (H[facility])     from a sigmoid stricture  . Nephrolithiasis   . Sleep apnea   . Overweight(278.02)   . Osteoarthritis   . GERD (gastroesophageal reflux disease)   . Anxiety   . Glaucoma   . Memory deficit   . Dementia      Past Surgical History  Procedure Laterality Date  . Bilateral tubal ligation    . Lithotripsy    . 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 Cardiac surgery    . Cardiac catheterization N/A 03/18/2015    Procedure: LEFT HEART CATH AND CORONARY ANGIOGRAPHY;  Surgeon: Lorretta Harp, MD;  Location: [facility] CATH LAB;  Service: Cardiovascular;  Laterality: N/A;      Social History:  reports that she has been smoking Cigarettes.  She has a 25 pack-year smoking history. She has never used smokeless tobacco. She reports that she does not drink alcohol or use illicit drugs.   Allergies  Allergen Reactions  . Lisinopril Other (See Comments)    REACTION: respiratory arrest jan 2009  . Spironolactone Other (See Comments)    PATIENT HAD SEVERE SIDE EFFECTS  . Donepezil Diarrhea  . Eggs Or Egg-Derived Products Diarrhea  . Erythromycin Other (See Comments)    Makes me feel weird   .  Macrolides And Ketolides Other (See Comments)  . Penicillins Itching and Other (See Comments)    Too much as a kid  . Quinapril Hcl Itching and Other (See Comments)    Too much AS A KID  . Angiotensin Receptor Blockers Other (See Comments)    unknown  . Lipitor [Atorvastatin] Palpitations    FAST HEART RATE  . Pentazocine Lactate Other (See Comments)    Reaction unknown  . Propoxyphene Hcl Other (See Comments)    Reaction unknown  . Sulfonamide  Derivatives Other (See Comments)    Childhood allergy  . Vancomycin Rash    CLOSES THROAT. ANY MYCIN FAMILY    Family History  Problem Relation Age of Onset  . Breast cancer Maternal Grandmother   . Diabetes Father   . Heart disease Father   . Diabetes Sister   . Colon cancer Neg Hx   . Heart attack Father   . Hypertension Sister   . Stroke Mother        Prior to Admission medications   Medication Sig Start Date End Date Taking? Authorizing Provider  albuterol (PROVENTIL HFA;VENTOLIN HFA) 108 (90 BASE) MCG/ACT inhaler Inhale 1-2 puffs into the lungs every 6 (six) hours as needed for wheezing.    Yes Historical Provider, MD  albuterol (PROVENTIL) (2.5 MG/3ML) 0.083% nebulizer solution Take 3 mLs (2.5 mg total) by nebulization 4 (four) times daily. 07/22/15  Yes Brand Males, MD  ARIPiprazole (ABILIFY) 2 MG tablet Take 1 tablet (2 mg total) by mouth 2 (two) times daily. 01/04/16  Yes Shanker Kristeen Mans, MD  aspirin EC 81 MG tablet Take 81 mg by mouth daily.   Yes Historical Provider, MD  budesonide (PULMICORT) 0.25 MG/2ML nebulizer solution Take 2 mLs (0.25 mg total) by nebulization 2 (two) times daily. 07/22/15  Yes Brand Males, MD  clopidogrel (PLAVIX) 75 MG tablet TAKE 1 TABLET BY MOUTH EVERY DAY 11/11/14  Yes Burnell Blanks, MD  furosemide (LASIX) 20 MG tablet Take 20 mg by mouth daily. EDEMA   Yes Historical Provider, MD  insulin glargine (LANTUS) 100 UNIT/ML injection Inject 0.1 mLs (10 Units total) into the skin daily. Patient taking differently: Inject 12-14 Units into the skin daily.  01/25/14  Yes Hosie Poisson, MD  insulin lispro (HUMALOG) 100 UNIT/ML injection Inject 0-10 Units into the skin 3 (three) times daily before meals. Sliding scale: below 150=0 units, 150-200=2 units, 201-250=4 units, 251-300=6 units, 301-350=8 units, above 350=10 units   Yes Historical Provider, MD  ipratropium (ATROVENT) 0.02 % nebulizer solution Take 2.5 mLs (0.5 mg total) by nebulization  4 (four) times daily. DX CODE J44.9 Patient taking differently: Take 0.5 mg by nebulization every 6 (six) hours as needed for wheezing.  07/22/15  Yes Brand Males, MD  isosorbide mononitrate (IMDUR) 30 MG 24 hr tablet Take 0.5 tablets (15 mg total) by mouth daily. 10/25/15  Yes Isaiah Serge, NP  LORazepam (ATIVAN) 0.5 MG tablet Take 1 tablet (0.5 mg total) by mouth 2 (two) times daily as needed for anxiety. 01/04/16  Yes Shanker Kristeen Mans, MD  memantine (NAMENDA XR) 28 MG CP24 24 hr capsule Take 1 capsule (28 mg total) by mouth daily. 01/04/16  Yes Shanker Kristeen Mans, MD  metFORMIN (GLUCOPHAGE) 500 MG tablet Take 2,000 mg by mouth daily before supper. Reported on 12/22/2015   Yes Historical Provider, MD  metoprolol succinate (TOPROL-XL) 25 MG 24 hr tablet Take 1 tablet (25 mg total) by mouth daily. 10/14/13  Yes Brandi L  Alfredo Martinez, NP  mirtazapine (REMERON SOL-TAB) 15 MG disintegrating tablet Take 1 tablet (15 mg total) by mouth at bedtime. 01/04/16  Yes Shanker Kristeen Mans, MD  mometasone-formoterol (DULERA) 100-5 MCG/ACT AERO Inhale 2 puffs into the lungs 2 (two) times daily.   Yes Historical Provider, MD  nitroGLYCERIN (NITROSTAT) 0.4 MG SL tablet Place 1 tablet (0.4 mg total) under the tongue every 5 (five) minutes as needed. For chest pain. 06/18/13  Yes Burnell Blanks, MD  omeprazole (PRILOSEC) 20 MG capsule Take 20 mg by mouth as needed (REFLUX).   Yes Historical Provider, MD  oxyCODONE-acetaminophen (PERCOCET/ROXICET) 5-325 MG tablet Take 1 tablet by mouth every 6 (six) hours as needed for moderate pain or severe pain. 01/04/16  Yes Shanker Kristeen Mans, MD  pravastatin (PRAVACHOL) 40 MG tablet TAKE 1 TABLET (40 MG TOTAL) BY MOUTH DAILY. <PLEASE MAKE APPOINTMENT FOR REFILLS> 12/25/14  Yes Burnell Blanks, MD     Physical Exam: Filed Vitals:   02/04/16 0126 02/04/16 0145 02/04/16 0245  BP:  152/74 135/74  Pulse: 80 77 81  Temp: 98.4 F (36.9 C)    TempSrc: Oral    Resp: 19 20 19    Height: 5\' 6"  (1.676 m)    Weight: 68.04 kg (150 lb)    SpO2: 99% 97% 100%     Constitutional: Vital signs reviewed. Patient is a elderly female who appears to be in no acute distress at this time. Head: Normocephalic and atraumatic  Ear: Decreased overall hearing  Mouth: no erythema or exudates, MMM  Eyes: PERRL, EOMI, conjunctivae normal, No scleral icterus.  Neck: Supple, Trachea midline normal ROM, No JVD, mass, thyromegaly, or carotid bruit present.  Cardiovascular: RRR, S1 normal, S2 normal, no MRG, pulses symmetric and intact bilaterally  Pulmonary/Chest: CTAB, no wheezes, rales, or rhonchi  Abdominal: Soft. Non-tender, non-distended, bowel sounds are normal, no masses, organomegaly, or guarding present.  GU: no CVA tenderness Musculoskeletal: No joint deformities, erythema, or stiffness, ROM full and no nontender Ext: no edema and no cyanosis, pulses palpable bilaterally (DP and PT)  Hematology: no cervical, inginal, or axillary adenopathy.  Neurological: A&O x3, Strenght is normal and symmetric bilaterally, cranial nerve II-XII are grossly intact, no focal motor deficit, sensory intact to light touch bilaterally.  Skin: Warm, dry and intact. No rash, cyanosis, or clubbing.  Psychiatric: Normal mood and affect. speech and behavior is normal. Judgment and thought content normal. Cognition and memory are normal.      Data Review   Micro Results No results found for this or any previous visit (from the past 240 hour(s)).  Radiology Reports Dg Chest 2 View  02/04/2016  CLINICAL DATA:  Initial evaluation for acute right chest pain today, shortness of breath. EXAM: CHEST  2 VIEW COMPARISON:  Prior radiograph from 01/01/2016. FINDINGS: Mild cardiomegaly is stable. Mediastinal silhouette within normal limits. Atheromatous plaque within the aortic arch. Hazy opacity at the left lung base on frontal projection likely largely related to mediastinal fat pad. No definite pleural  effusion. No consolidative airspace disease. No pulmonary edema. No pneumothorax. No acute osseus abnormality. Multilevel degenerative spondylolysis present within the visualized spine. IMPRESSION: 1. No active cardiopulmonary disease. 2. Stable cardiomegaly without pulmonary edema. Electronically Signed   By: Jeannine Boga M.D.   On: 02/04/2016 02:42     CBC  Recent Labs Lab 02/04/16 0140  WBC 10.8*  HGB 14.0  HCT 42.2  PLT 151  MCV 97.0  MCH 32.2  MCHC 33.2  RDW 13.1  Chemistries   Recent Labs Lab 02/04/16 0247  NA 143  K 4.0  CL 105  CO2 25  GLUCOSE 172*  BUN 18  CREATININE 0.83  CALCIUM 8.9   ------------------------------------------------------------------------------------------------------------------ estimated creatinine clearance is 55.7 mL/min (by C-G formula based on Cr of 0.83). ------------------------------------------------------------------------------------------------------------------ No results for input(s): HGBA1C in the last 72 hours. ------------------------------------------------------------------------------------------------------------------ No results for input(s): CHOL, HDL, LDLCALC, TRIG, CHOLHDL, LDLDIRECT in the last 72 hours. ------------------------------------------------------------------------------------------------------------------ No results for input(s): TSH, T4TOTAL, T3FREE, THYROIDAB in the last 72 hours.  Invalid input(s): FREET3 ------------------------------------------------------------------------------------------------------------------ No results for input(s): VITAMINB12, FOLATE, FERRITIN, TIBC, IRON, RETICCTPCT in the last 72 hours.  Coagulation profile No results for input(s): INR, PROTIME in the last 168 hours.  No results for input(s): DDIMER in the last 72 hours.  Cardiac Enzymes No results for input(s): CKMB, TROPONINI, MYOGLOBIN in the last 168 hours.  Invalid input(s):  CK ------------------------------------------------------------------------------------------------------------------ Invalid input(s): POCBNP   CBG: No results for input(s): GLUCAP in the last 168 hours.     EKG: Independently reviewed. Sinus rhythm consider left atrial enlargement. Otherwise similar to previous EKG   Assessment/Plan Chest pain: Acute.  Patient with a previous history of coronary artery disease and presents with complaints of some right-sided chest pain with shortness of breath. Initial EKG showed no acute changes from previous tracings. Initial troponins negative. - Admit to a telemetry bed - Trend cardiac troponins  - Echo cardiogram in a.m. - Consult cardiology in a.m. to get further recommendations - Continue aspirin  Leukocytosis: WBC elevated at 10.8 on admission. Cause unknown at this time. Chest x-ray showing stable cardiomegaly. - Check urinalysis  Essential hypertension - Continue metoprolol and Lasix  Coronary artery disease s/p PCI with history of ischemic cardiomyopathy: EF noted to be 45% in 02/2015 - Continue isosorbide, Plavix , and aspirin  COPD - Continue home breathing treatments nebulized of albuterol, ipratropium, budesonide  Diabetes mellitus type 2 - Lantus 10 units subcutaneous at bedtime - CBGs every before meals and at bedtime with sensitive sliding scale insulin - Hypoglycemic protocol  Dementia - Continue Namenda  GERD - Protonix pharmacy's substitution for omeprazole  Code Status:   full Family Communication: bedside Disposition Plan: admit   Total time spent 55 minutes.Greater than 50% of this time was spent in counseling, explanation of diagnosis, planning of further management, and coordination of care  [facility] Pager [phone]  If 7PM-7AM, please contact night-coverage [email or web address] Password [facility] 02/04/2016, 4:44 AM

## 2016-02-04 NOTE — Progress Notes (Signed)
Patient admitted this AM  Briefly, 75 year old female with a past medical history significant for CAD, ischemic cardiomyopathy, HTN, HLD, COPD, GERD, anxiety, dementia; who presents with complaints of shortness of breath and chest pain. Patient was admitted for further work up.  Plan: Chest pain - Trop thus far neg - Pt seen by Cardiology in consultation - Plan for continued close observation for now and follow remainder of trop Leukocytosis - UA with numerous WBC but also with moderate blood. Will repeat in AM. - Afebrile - CXR clear HTN - BP stable COPD - on min o2 support - Stable DM2 - cont SSI coverage Dementia - Seems stable at present DVT prophylaxis - Lovenox  [facility], [facility] Pager [phone]. If 7PM-7AM, please contact night-coverage at [email or web address], password [facility]

## 2016-02-04 NOTE — ED Provider Notes (Signed)
CSN: [id number]     Arrival date & time 02/04/16  0120 History  By signing my name below, I, [name], attest that this documentation has been prepared under the direction and in the presence of Gareth Morgan, MD . Electronically Signed: Evelene [name], Scribe. 02/04/2016. 2:38 PM.     Chief Complaint  Patient presents with  . Shortness of Breath  . Chest Pain    Patient is a 75 y.o. female presenting with chest pain. The history is provided by the patient. No language interpreter was used.  Chest Pain Pain location:  Substernal area Pain quality: pressure   Pain radiates to:  Does not radiate Pain radiates to the back: no   Pain severity:  Moderate Progression:  Resolved Chronicity:  New Relieved by:  Nitroglycerin and aspirin Associated symptoms: diaphoresis and shortness of breath   Associated symptoms: no abdominal pain, no back pain, no cough, no fever, no headache and no nausea   Risk factors: coronary artery disease, diabetes mellitus, high cholesterol, hypertension and obesity     HPI Comments:  [name] is a 75 y.o. female with an extensive medical history including: dementia,  CAD, DM, COPD, HTN and HLD,  who presents to the Emergency Department via EMS complaining of non-radiating, central CP which began yesterday (02/03/16). Pt describes the CP as a pressure. She was given 324 mg ASA and 1 nitro en route with relief of pain. She reports associated SOB, mild diaphoresis, intermittent lightheadedness. She denies h/o similar pain. Pt without pain at this time. Pt denies nausea.    Past Medical History  Diagnosis Date  . CAD (coronary artery disease)     a. s/p inferior STEMI 12/29/10 with rotablator atherectomy RCA 12/31/10 and DES x 2 RCA;  b. Lexiscan Myoview (02/2015): mild reversible apical anterior perfusion defect. C. cath 02/2015 50% LAD lesion with patent stent, Tx Rx    . Ischemic cardiomyopathy     EF 45%cath 2012 EF55% 5/12; 35% 11/14  . Diabetes mellitus      type 2  . HTN (hypertension)   . Hyperlipidemia   . Diverticulosis   . Respiratory arrest//ACE Inhibitor presumed cause   . COPD (chronic obstructive pulmonary disease) ([place])   . Small bowel obstruction (HCC)     from a sigmoid stricture  . Nephrolithiasis   . Sleep apnea   . Overweight(278.02)   . Osteoarthritis   . GERD (gastroesophageal reflux disease)   . Anxiety   . Glaucoma   . Memory deficit   . Dementia    Past Surgical History  Procedure Laterality Date  . Bilateral tubal ligation    . Lithotripsy    . 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 Cardiac surgery    . Cardiac catheterization N/A 03/18/2015    Procedure: LEFT HEART CATH AND CORONARY ANGIOGRAPHY;  Surgeon: Lorretta Harp, MD;  Location: [facility] CATH LAB;  Service: Cardiovascular;  Laterality: N/A;   Family History  Problem Relation Age of Onset  . Breast cancer Maternal Grandmother   . Diabetes Father   . Heart disease Father   . Diabetes Sister   . Colon cancer Neg Hx   . Heart attack Father   . Hypertension Sister   . Stroke Mother    Social History  Substance Use Topics  . Smoking status: Current Every Day Smoker -- 1.00 packs/day for 25 years    Types: Cigarettes  . Smokeless tobacco: Never Used  Comment: over a ppd for 40+ years; quit 10/2004.  unsuccessfully tried eCigs.  . Alcohol Use: No   OB History    No data available     Review of Systems  Constitutional: Positive for diaphoresis. Negative for fever.  HENT: Negative for sore throat.   Eyes: Negative for visual disturbance.  Respiratory: Positive for shortness of breath. Negative for cough.   Cardiovascular: Positive for chest pain.  Gastrointestinal: Negative for nausea and abdominal pain.  Genitourinary: Negative for difficulty urinating.  Musculoskeletal: Negative for back pain and neck pain.  Skin: Negative for rash.  Neurological: Negative for syncope and headaches.    Allergies   Lisinopril; Spironolactone; Donepezil; Eggs or egg-derived products; Erythromycin; Macrolides and ketolides; Penicillins; Quinapril hcl; Angiotensin receptor blockers; Lipitor; Pentazocine lactate; Propoxyphene hcl; Sulfonamide derivatives; and Vancomycin  Home Medications   Prior to Admission medications   Medication Sig Start Date End Date Taking? Authorizing Provider  albuterol (PROVENTIL HFA;VENTOLIN HFA) 108 (90 BASE) MCG/ACT inhaler Inhale 1-2 puffs into the lungs every 6 (six) hours as needed for wheezing.    Yes Historical Provider, MD  albuterol (PROVENTIL) (2.5 MG/3ML) 0.083% nebulizer solution Take 3 mLs (2.5 mg total) by nebulization 4 (four) times daily. 07/22/15  Yes Brand Males, MD  ARIPiprazole (ABILIFY) 2 MG tablet Take 1 tablet (2 mg total) by mouth 2 (two) times daily. 01/04/16  Yes Shanker Kristeen Mans, MD  aspirin EC 81 MG tablet Take 81 mg by mouth daily.   Yes Historical Provider, MD  budesonide (PULMICORT) 0.25 MG/2ML nebulizer solution Take 2 mLs (0.25 mg total) by nebulization 2 (two) times daily. 07/22/15  Yes Brand Males, MD  clopidogrel (PLAVIX) 75 MG tablet TAKE 1 TABLET BY MOUTH EVERY DAY 11/11/14  Yes Burnell Blanks, MD  furosemide (LASIX) 20 MG tablet Take 20 mg by mouth daily. EDEMA   Yes Historical Provider, MD  insulin glargine (LANTUS) 100 UNIT/ML injection Inject 0.1 mLs (10 Units total) into the skin daily. Patient taking differently: Inject 12-14 Units into the skin daily.  01/25/14  Yes Hosie Poisson, MD  insulin lispro (HUMALOG) 100 UNIT/ML injection Inject 0-10 Units into the skin 3 (three) times daily before meals. Sliding scale: below 150=0 units, 150-200=2 units, 201-250=4 units, 251-300=6 units, 301-350=8 units, above 350=10 units   Yes Historical Provider, MD  ipratropium (ATROVENT) 0.02 % nebulizer solution Take 2.5 mLs (0.5 mg total) by nebulization 4 (four) times daily. DX CODE J44.9 Patient taking differently: Take 0.5 mg by nebulization  every 6 (six) hours as needed for wheezing.  07/22/15  Yes Brand Males, MD  isosorbide mononitrate (IMDUR) 30 MG 24 hr tablet Take 0.5 tablets (15 mg total) by mouth daily. 10/25/15  Yes Isaiah Serge, NP  LORazepam (ATIVAN) 0.5 MG tablet Take 1 tablet (0.5 mg total) by mouth 2 (two) times daily as needed for anxiety. 01/04/16  Yes Shanker Kristeen Mans, MD  memantine (NAMENDA XR) 28 MG CP24 24 hr capsule Take 1 capsule (28 mg total) by mouth daily. 01/04/16  Yes Shanker Kristeen Mans, MD  metFORMIN (GLUCOPHAGE) 500 MG tablet Take 2,000 mg by mouth daily before supper. Reported on 12/22/2015   Yes Historical Provider, MD  metoprolol succinate (TOPROL-XL) 25 MG 24 hr tablet Take 1 tablet (25 mg total) by mouth daily. 10/14/13  Yes Donita Brooks, NP  mirtazapine (REMERON SOL-TAB) 15 MG disintegrating tablet Take 1 tablet (15 mg total) by mouth at bedtime. 01/04/16  Yes Shanker Kristeen Mans, MD  mometasone-formoterol (  DULERA) 100-5 MCG/ACT AERO Inhale 2 puffs into the lungs 2 (two) times daily.   Yes Historical Provider, MD  nitroGLYCERIN (NITROSTAT) 0.4 MG SL tablet Place 1 tablet (0.4 mg total) under the tongue every 5 (five) minutes as needed. For chest pain. 06/18/13  Yes Burnell Blanks, MD  omeprazole (PRILOSEC) 20 MG capsule Take 20 mg by mouth as needed (REFLUX).   Yes Historical Provider, MD  oxyCODONE-acetaminophen (PERCOCET/ROXICET) 5-325 MG tablet Take 1 tablet by mouth every 6 (six) hours as needed for moderate pain or severe pain. 01/04/16  Yes Shanker Kristeen Mans, MD  pravastatin (PRAVACHOL) 40 MG tablet TAKE 1 TABLET (40 MG TOTAL) BY MOUTH DAILY. <PLEASE MAKE APPOINTMENT FOR REFILLS> 12/25/14  Yes Burnell Blanks, MD   BP 146/62 mmHg  Pulse 84  Temp(Src) 98 F (36.7 C) (Oral)  Resp 17  Ht 5\' 6"  (1.676 m)  Wt 150 lb (68.04 kg)  BMI 24.22 kg/m2  SpO2 97% Physical Exam  Constitutional: She is oriented to person, place, and time. She appears well-developed and well-nourished. No distress.   HENT:  Head: Normocephalic and atraumatic.  Eyes: Conjunctivae and EOM are normal.  Neck: Normal range of motion.  Cardiovascular: Normal rate, regular rhythm, normal heart sounds and intact distal pulses.  Exam reveals no gallop and no friction rub.   No murmur heard. Pulmonary/Chest: Effort normal and breath sounds normal. No respiratory distress. She has no wheezes. She has no rales.  Abdominal: Soft. She exhibits no distension. There is no tenderness. There is no guarding.  Musculoskeletal: She exhibits no edema or tenderness.  Neurological: She is alert and oriented to person, place, and time.  Skin: Skin is warm and dry. No rash noted. She is not diaphoretic. No erythema.  Nursing note and vitals reviewed.   ED Course  Procedures   DIAGNOSTIC STUDIES:  Oxygen Saturation is 99% on RA, normal by my interpretation.    COORDINATION OF CARE:  1:30 AM  Pt aware of possible hospital admission. Discussed treatment plan with pt at bedside and pt agreed to plan.  Labs Review Labs Reviewed  CBC - Abnormal; Notable for the following:    WBC 10.8 (*)    All other components within normal limits  BASIC METABOLIC PANEL - Abnormal; Notable for the following:    Glucose, Bld 172 (*)    All other components within normal limits  URINALYSIS, ROUTINE W REFLEX MICROSCOPIC (NOT AT [facility]) - Abnormal; Notable for the following:    APPearance CLOUDY (*)    Glucose, UA 100 (*)    Hgb urine dipstick MODERATE (*)    Leukocytes, UA MODERATE (*)    All other components within normal limits  URINE MICROSCOPIC-ADD ON - Abnormal; Notable for the following:    Squamous Epithelial / LPF 0-5 (*)    Bacteria, UA RARE (*)    All other components within normal limits  CBG MONITORING, ED - Abnormal; Notable for the following:    Glucose-Capillary 191 (*)    All other components within normal limits  TROPONIN I  TROPONIN I  TROPONIN I  I-STAT TROPOININ, ED    Imaging Review Dg Chest 2  View  02/04/2016  CLINICAL DATA:  Initial evaluation for acute right chest pain today, shortness of breath. EXAM: CHEST  2 VIEW COMPARISON:  Prior radiograph from 01/01/2016. FINDINGS: Mild cardiomegaly is stable. Mediastinal silhouette within normal limits. Atheromatous plaque within the aortic arch. Hazy opacity at the left lung base on frontal projection  likely largely related to mediastinal fat pad. No definite pleural effusion. No consolidative airspace disease. No pulmonary edema. No pneumothorax. No acute osseus abnormality. Multilevel degenerative spondylolysis present within the visualized spine. IMPRESSION: 1. No active cardiopulmonary disease. 2. Stable cardiomegaly without pulmonary edema. Electronically Signed   By: Jeannine Boga M.D.   On: 02/04/2016 02:42   I have personally reviewed and evaluated these images and lab results as part of my medical decision-making.   EKG Interpretation   Date/Time:  Friday February 04 2016 01:34:37 EDT Ventricular Rate:  78 PR Interval:  185 QRS Duration: 90 QT Interval:  410 QTC Calculation: 467 R Axis:   79 Text Interpretation:  Sinus rhythm Consider left atrial enlargement  Probable anteroseptal infarct, old No significant change since last  tracing Confirmed by [place] ([id number]) on 02/04/2016 2:05:41 AM      MDM  Will order CXR, CBC, and troponin.  Final diagnoses:  Chest pain, unspecified chest pain type   75 year old female with history CAD, ischemic cardiomyopathy, DM, htn, hyperlipidemia,  presents with concern of chest pain. Differential diagnosis for chest pain includes pulmonary embolus, dissection, pneumothorax, pneumonia, ACS, myocarditis, pericarditis.  EKG was done and evaluate by me and showed no acute ST changes and no signs of pericarditis. Chest x-ray was done and evaluated by me and radiology and showed no sign of pneumonia or pneumothorax.  No hypoxia, no tachypnea, and feel history is more concerning for  anginal symptoms than PE. Pulses equal bilaterally and low suspicion for dissection by hx/exam. Troponin negative.  Patient chest pain free in ED after receiving ASA and ntiro with EMS.  She will be admitted to Dr. Tamala Julian, hospitalist for further chest pain care.  I personally performed the services described in this documentation, which was scribed in my presence. The recorded information has been reviewed and is accurate.   Gareth Morgan, MD 02/04/16 1438

## 2016-02-04 NOTE — ED Notes (Signed)
Heart Healthy diet ordered for patient for lunch.

## 2016-02-04 NOTE — ED Notes (Signed)
Daughter [name] [phone]

## 2016-02-04 NOTE — ED Notes (Signed)
Patient is from [employer], with complaints of shortness of breath. Hx of chest pain earlier today, nausea and vomiting, but no shortness of breath. Pain was under right breast, nonradiating. bp 142/85 p 80with ems. 324mg  of aspirin given, 1 nitro given, pain gone.

## 2016-02-04 NOTE — Progress Notes (Signed)
Dr Radford Pax made aware of earlier episode of 10 beat run of VT. Am lab results provided. New orders obtained.

## 2016-02-04 NOTE — Consult Note (Signed)
CARDIOLOGY CONSULT NOTE   Patient ID: [name] MRN: [id number], DOB/AGE: [birth year]   Admit date: 02/04/2016 Date of Consult: 02/04/2016   Primary Physician: Vidal Schwalbe, MD Primary Cardiologist: Dr. Angelena Form  Pt. Profile  75 year old severely demented Caucasian female with past medical history of HTN, HLD, DMII, chronic systolic heart failure, COPD, tobacco abuse, and CAD s/p PCI to RCA in 2012 who presented with chest pain and abdominal pain   Problem List  Past Medical History  Diagnosis Date  . CAD (coronary artery disease)     a. s/p inferior STEMI 12/29/10 with rotablator atherectomy RCA 12/31/10 and DES x 2 RCA;  b. Lexiscan Myoview (02/2015): mild reversible apical anterior perfusion defect. C. cath 02/2015 50% LAD lesion with patent stent, Tx Rx    . Ischemic cardiomyopathy     EF 45%cath 2012 EF55% 5/12; 35% 11/14  . Diabetes mellitus     type 2  . HTN (hypertension)   . Hyperlipidemia   . Diverticulosis   . Respiratory arrest//ACE Inhibitor presumed cause   . COPD (chronic obstructive pulmonary disease) ([place])   . Small bowel obstruction (HCC)     from a sigmoid stricture  . Nephrolithiasis   . Sleep apnea   . Overweight(278.02)   . Osteoarthritis   . GERD (gastroesophageal reflux disease)   . Anxiety   . Glaucoma   . Memory deficit   . Dementia     Past Surgical History  Procedure Laterality Date  . Bilateral tubal ligation    . Lithotripsy    . 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 Cardiac surgery    . Cardiac catheterization N/A 03/18/2015    Procedure: LEFT HEART CATH AND CORONARY ANGIOGRAPHY;  Surgeon: Lorretta Harp, MD;  Location: [facility] CATH LAB;  Service: Cardiovascular;  Laterality: N/A;     Allergies  Allergies  Allergen Reactions  . Lisinopril Other (See Comments)    REACTION: respiratory arrest jan 2009  . Spironolactone Other (See Comments)    PATIENT HAD SEVERE SIDE EFFECTS  . Donepezil  Diarrhea  . Eggs Or Egg-Derived Products Diarrhea  . Erythromycin Other (See Comments)    Makes me feel weird   . Macrolides And Ketolides Other (See Comments)  . Penicillins Itching and Other (See Comments)    Too much as a kid  . Quinapril Hcl Itching and Other (See Comments)    Too much AS A KID  . Angiotensin Receptor Blockers Other (See Comments)    unknown  . Lipitor [Atorvastatin] Palpitations    FAST HEART RATE  . Pentazocine Lactate Other (See Comments)    Reaction unknown  . Propoxyphene Hcl Other (See Comments)    Reaction unknown  . Sulfonamide Derivatives Other (See Comments)    Childhood allergy  . Vancomycin Rash    CLOSES THROAT. ANY MYCIN FAMILY    HPI   The patient is a 75 year old severely demented Caucasian female with past medical history of HTN, HLD, DMII, chronic systolic heart failure, COPD, tobacco abuse, and CAD s/p PCI to RCA in 2012. She originally had an acute inferior STEMI in 2012, emergent cath was done on 12/28/2010 which showed subtotally totally occluded RCA, it could not be immediately treated due to severe calcification in the mid vessel, she was brought back to the cath lab on 12/31/2010 and underwent Rotablator atherectomy of the mid RCA and placement of 2 drug-eluting stents. She had a mildly abnormal Myoview on 03/17/2015 which  showed EF 47% with small mild reversible apical anterior perfusion defect which may represent a small area of ischemia. She underwent cardiac catheterization on 03/18/2015 which showed a 50% lesion in LAD, patent stents in RCA, continues medical therapy was recommended. Echocardiogram obtained during the admission showed EF at least 45%, mild LVH, mild to moderate MR, normal RVEF.  She was last admitted in November 2016 with chest pain, there was one elevated troponin of 0.1 which is probably spurious lab results, all other trops were normal. She was discharged on medical therapy. She was last seen in the office on 10/24/2015, at  which time she was doing well. She did have a fall in mid February and was later admitted with altered mental status with elevated lactic acid level 2.5 which was felt to be related to her severe dementia.  She was admitted to [facility] on 02/04/2016 with complaint of chest pain and shortness of breath. She described the chest pain as a right-sided chest pain. She also complaining of right flank pain. She has a history of diverticulitis status post bowel resection. However given her severe dementia, she was unable to tell me when was the last time she had any chest discomfort. She is oriented to herself and he location only and does not know the year, date of the week, or who is the president. Unfortunately given her severe dementia, she was unable to provide me with any meaningful history. She states she has shortness of breath at times, however she is not short of breath right now. She has been living in assisted living facility. Per daughter, she has been having panic attacks which can trigger occasional shortness breath or chest discomfort.   Inpatient Medications  . albuterol  2.5 mg Nebulization QID  . ARIPiprazole  2 mg Oral BID  . aspirin EC  81 mg Oral Daily  . budesonide  0.25 mg Nebulization BID  . clopidogrel  75 mg Oral Daily  . enoxaparin (LOVENOX) injection  40 mg Subcutaneous Daily  . furosemide  20 mg Oral Daily  . insulin aspart  0-9 Units Subcutaneous TID WC  . insulin glargine  10 Units Subcutaneous QHS  . isosorbide mononitrate  15 mg Oral Daily  . memantine  28 mg Oral Daily  . metoprolol succinate  25 mg Oral Daily  . mirtazapine  15 mg Oral QHS  . pantoprazole  40 mg Oral Daily  . pravastatin  40 mg Oral Daily    Family History Family History  Problem Relation Age of Onset  . Breast cancer Maternal Grandmother   . Diabetes Father   . Heart disease Father   . Diabetes Sister   . Colon cancer Neg Hx   . Heart attack Father   . Hypertension Sister   .  Stroke Mother      Social History Social History   Social History  . Marital Status: Widowed    Spouse Name: N/A  . Number of Children: 2  . Years of Education: 11   Occupational History  . Retired     Haematologist    Social History Main Topics  . Smoking status: Current Every Day Smoker -- 1.00 packs/day for 25 years    Types: Cigarettes  . Smokeless tobacco: Never Used     Comment: over a ppd for 40+ years; quit 10/2004.  unsuccessfully tried eCigs.  . Alcohol Use: No  . Drug Use: No  . Sexual Activity: No  Other Topics Concern  . Not on file   Social History Narrative   Patient is widowed with 2 children.   Patient is right handed.   Patient has 11 th grade education.   Patient drinks 6-7 daily.     Review of Systems  General:  No chills, fever, night sweats or weight changes.  Cardiovascular:  No dyspnea on exertion, edema, orthopnea, palpitations, paroxysmal nocturnal dyspnea. +chest pain Dermatological: No rash, lesions/masses Respiratory: + dry cough, dyspnea Urologic: No hematuria, dysuria Abdominal:   No nausea, vomiting, diarrhea, bright red blood per rectum, melena, or hematemesis Neurologic:  No visual changes, wkns, changes in mental status. All other systems reviewed and are otherwise negative except as noted above.  Physical Exam  Blood pressure 128/73, pulse 86, temperature 98.4 F (36.9 C), temperature source Oral, resp. rate 21, height 5\' 6"  (1.676 m), weight 150 lb (68.04 kg), SpO2 93 %.  General: Pleasant, NAD Psych: severely demented Neuro: Alert. Moves all extremities spontaneously. Oriented to self and the location, cannot recall the year, the day of the week, or who is the president HEENT: Normal  Neck: Supple without bruits or JVD. Lungs:  Resp regular and unlabored, CTA. Heart: RRR no s3, s4, or murmurs. Abdomen: Soft, non-tender, non-distended, BS + x 4.  Extremities: No clubbing, cyanosis or edema. DP/PT/Radials  2+ and equal bilaterally.  Labs   Recent Labs  02/04/16 0454  TROPONINI <0.03   Lab Results  Component Value Date   WBC 10.8* 02/04/2016   HGB 14.0 02/04/2016   HCT 42.2 02/04/2016   MCV 97.0 02/04/2016   PLT 151 02/04/2016    Recent Labs Lab 02/04/16 0247  NA 143  K 4.0  CL 105  CO2 25  BUN 18  CREATININE 0.83  CALCIUM 8.9  GLUCOSE 172*   Lab Results  Component Value Date   CHOL 69 12/19/2012   HDL 31.30* 12/19/2012   LDLCALC 10 12/19/2012   TRIG 138.0 12/19/2012   Lab Results  Component Value Date   DDIMER 0.30 10/06/2015    Radiology/Studies  Dg Chest 2 View  02/04/2016  CLINICAL DATA:  Initial evaluation for acute right chest pain today, shortness of breath. EXAM: CHEST  2 VIEW COMPARISON:  Prior radiograph from 01/01/2016. FINDINGS: Mild cardiomegaly is stable. Mediastinal silhouette within normal limits. Atheromatous plaque within the aortic arch. Hazy opacity at the left lung base on frontal projection likely largely related to mediastinal fat pad. No definite pleural effusion. No consolidative airspace disease. No pulmonary edema. No pneumothorax. No acute osseus abnormality. Multilevel degenerative spondylolysis present within the visualized spine. IMPRESSION: 1. No active cardiopulmonary disease. 2. Stable cardiomegaly without pulmonary edema. Electronically Signed   By: Jeannine Boga M.D.   On: 02/04/2016 02:42    ECG  Normal sinus rhythm with poor R-wave progression in anterior lead.  ASSESSMENT AND PLAN  1. Chest pain  - Given her severe dementia, no change in the EKG and negative troponin, would like to avoid any invasive workup in this case.  - Will discuss with M.D., trend troponin for now, may consider Myoview if continued to be symptomatic.  - she has a h/o panic attack induced chest pain and SOB, unclear if she had another episode or if something else triggered the R sided chest pain  2. Diffuse abdominal pain  - She is mainly  focused on right-sided abdominal pain, however when pressed on, she did not grimace or jerk, when I pressed on the left side,  she also admitted to have some left-sided abdominal discomfort. She has a history of diverticulitis status post bowel resection.  - Will defer further workup to the internal medicine service.  3. CAD s/p PCI to RCA in 2012  4. HTN 5. HLD 6. DMII 7. chronic systolic heart failure: euvolemic on exam 8. COPD   Signed, Woodward Ku 02/04/2016, 9:28 AM   Agree with note by Almyra Deforest PA-C  Pt well known by our team. I cathed her 4/16 in setting of [place] w/o signif CAD. S/P RCA PCI 2012. Other probs as outlined, most notably dementia. Pt lives in an assisted care facility. Tx to [facility] ER because of CP/SOB this AM. There is prob an anxiety component. Currently pain free. Exam benign. EKG w/o acute changes. POC trop neg. Doubt ACS. Agree with cycling enz. Would like to avoid cath. Would not do functional study unless trop + or on going CP. Pt is not oriented. Will follow with you.  Lorretta Harp, M.D., [employer], [facility], [place] [address] [place], [place]  [address]  [phone] 02/04/2016 10:13 AM

## 2016-02-05 ENCOUNTER — Observation Stay (HOSPITAL_BASED_OUTPATIENT_CLINIC_OR_DEPARTMENT_OTHER): Payer: Medicare Other

## 2016-02-05 DIAGNOSIS — E785 Hyperlipidemia, unspecified: Secondary | ICD-10-CM | POA: Diagnosis not present

## 2016-02-05 DIAGNOSIS — R072 Precordial pain: Secondary | ICD-10-CM

## 2016-02-05 DIAGNOSIS — F0390 Unspecified dementia without behavioral disturbance: Secondary | ICD-10-CM | POA: Diagnosis not present

## 2016-02-05 DIAGNOSIS — I10 Essential (primary) hypertension: Secondary | ICD-10-CM | POA: Diagnosis not present

## 2016-02-05 DIAGNOSIS — R079 Chest pain, unspecified: Secondary | ICD-10-CM | POA: Diagnosis not present

## 2016-02-05 LAB — BASIC METABOLIC PANEL
Anion gap: 10 (ref 5–15)
Anion gap: 12 (ref 5–15)
BUN: 20 mg/dL (ref 6–20)
BUN: 21 mg/dL — AB (ref 6–20)
CALCIUM: 8.8 mg/dL — AB (ref 8.9–10.3)
CHLORIDE: 102 mmol/L (ref 101–111)
CO2: 27 mmol/L (ref 22–32)
CO2: 28 mmol/L (ref 22–32)
CREATININE: 0.97 mg/dL (ref 0.44–1.00)
CREATININE: 0.92 mg/dL (ref 0.44–1.00)
Calcium: 9.3 mg/dL (ref 8.9–10.3)
Chloride: 102 mmol/L (ref 101–111)
GFR calc Af Amer: >60 mL/min (ref >60)
GFR calc non Af Amer: 56 mL/min — ABNORMAL LOW (ref >60)
GFR calc non Af Amer: 60 mL/min — ABNORMAL LOW (ref >60)
GLUCOSE: 178 mg/dL — AB (ref 65–99)
Glucose, Bld: 224 mg/dL — ABNORMAL HIGH (ref 65–99)
Potassium: 3.7 mmol/L (ref 3.5–5.1)
Potassium: 4 mmol/L (ref 3.5–5.1)
SODIUM: 142 mmol/L (ref 135–145)
Sodium: 139 mmol/L (ref 135–145)

## 2016-02-05 LAB — ECHOCARDIOGRAM COMPLETE
Height: 66 in
Weight: 2338.64 oz

## 2016-02-05 LAB — MAGNESIUM
Magnesium: 1.5 mg/dL — ABNORMAL LOW (ref 1.7–2.4)
Magnesium: 2.2 mg/dL (ref 1.7–2.4)

## 2016-02-05 LAB — URINE MICROSCOPIC-ADD ON

## 2016-02-05 LAB — CBC
HEMATOCRIT: 40.3 % (ref 36.0–46.0)
Hemoglobin: 13.2 g/dL (ref 12.0–15.0)
MCH: 31.7 pg (ref 26.0–34.0)
MCHC: 32.8 g/dL (ref 30.0–36.0)
MCV: 96.9 fL (ref 78.0–100.0)
Platelets: 154 10*3/uL (ref 150–400)
RBC: 4.16 MIL/uL (ref 3.87–5.11)
RDW: 13.2 % (ref 11.5–15.5)
WBC: 8.6 10*3/uL (ref 4.0–10.5)

## 2016-02-05 LAB — TROPONIN I: Troponin I: 0.05 ng/mL — ABNORMAL HIGH (ref <0.031)

## 2016-02-05 LAB — GLUCOSE, CAPILLARY
GLUCOSE-CAPILLARY: 180 mg/dL — AB (ref 65–99)
Glucose-Capillary: 158 mg/dL — ABNORMAL HIGH (ref 65–99)
Glucose-Capillary: 229 mg/dL — ABNORMAL HIGH (ref 65–99)
Glucose-Capillary: 203 mg/dL — ABNORMAL HIGH (ref 65–99)

## 2016-02-05 LAB — URINALYSIS, ROUTINE W REFLEX MICROSCOPIC
BILIRUBIN URINE: NEGATIVE
Glucose, UA: 250 mg/dL — AB
HGB URINE DIPSTICK: NEGATIVE
KETONES UR: NEGATIVE mg/dL
NITRITE: NEGATIVE
PH: 5 (ref 5.0–8.0)
Protein, ur: NEGATIVE mg/dL
Specific Gravity, Urine: 1.022 (ref 1.005–1.030)

## 2016-02-05 MED ORDER — MAGNESIUM SULFATE 2 GM/50ML IV SOLN
2.0000 g | Freq: Once | INTRAVENOUS | Status: AC
  Administered 2016-02-05: 2 g via INTRAVENOUS
  Filled 2016-02-05: qty 50

## 2016-02-05 MED ORDER — POTASSIUM CHLORIDE CRYS ER 20 MEQ PO TBCR
20.0000 meq | EXTENDED_RELEASE_TABLET | Freq: Once | ORAL | Status: AC
  Administered 2016-02-05: 20 meq via ORAL
  Filled 2016-02-05: qty 1

## 2016-02-05 MED ORDER — METOPROLOL SUCCINATE ER 25 MG PO TB24: 12.5000 mg | ORAL_TABLET | Freq: Every day | ORAL | Status: DC

## 2016-02-05 NOTE — Progress Notes (Signed)
CSW received v-mail from [place] at [facility] with approval for patient to return to facility this afternoon.  EMS arranged for transport back to facility.  Notified patient's daughter [name] of above. Fl2 not required due to patient's observation.   Lorie Phenix. Pauline Good, [place] (weekend coverage)

## 2016-02-05 NOTE — Patient Care Conference (Signed)
Attempted to call pt's daughter, Benjamine Mola. No answer.

## 2016-02-05 NOTE — Progress Notes (Signed)
CSW sent over discharge summary to [place] at [facility]. Veronica to review and call back re: accepting patient back this afternoon.   Raynaldo Opitz, LCSW Clinical Social Worker cell #: [phone] (weekend coverage)

## 2016-02-05 NOTE — Progress Notes (Signed)
Spoke with [name] who is in charge at [facility] to confirm bed availability for patient's  transfer. Informed that they were unable to accept her transfer tonight because they do not have a nebulizer  on site at this time.

## 2016-02-05 NOTE — Discharge Summary (Addendum)
Physician Discharge Summary  [name] [id number] DOB: Apr 30, 1941 DOA: 02/04/2016  PCP: Vidal Schwalbe, MD  Admit date: 02/04/2016 Discharge date: 02/06/2016  Time spent: 20 minutes  Recommendations for Outpatient Follow-up:  1. Follow up with PCP in 2-3 weeks   Discharge Diagnoses:  Principal Problem:   Chest pain Active Problems:   DM2 (diabetes mellitus, type 2) ([place])   Essential hypertension, benign   HLD (hyperlipidemia)   COPD (chronic obstructive pulmonary disease) ([place])   Senile dementia without behavioral disturbance   Discharge Condition: Stable  Diet recommendation: Diabetic, heart healthy  Filed Weights   02/04/16 0126 02/05/16 0450 02/06/16 0500  Weight: 68.04 kg (150 lb) 66.3 kg (146 lb 2.6 oz) 66.4 kg (146 lb 6.2 oz)    History of present illness:  Please review dictated H and P from 3/17 for details. Briefly, 75 year old female with a past medical history significant for CAD, ischemic cardiomyopathy, HTN, HLD, COPD, GERD, anxiety, dementia; who presents with complaints of shortness of breath and chest pain. Patient was admitted for further work up  Hospital Course:  Chest pain - Trop remained unremarkable - Pt seen by Cardiology in consultation - Cardiology recs for continued medical management and has since signed off - Patient remained stable this admission  Leukocytosis - UA with numerous WBC but also with moderate blood. Repeat UA is unremarkable - Remained afebrile - CXR clear - Leukocytosis resolved  HTN - BP stable  COPD - on min o2 support - Stable  DM2 - cont SSI coverage while inpatient  Dementia - Seems stable  DVT prophylaxis - Lovenox while admitted   Consultations:  Cardiology  Discharge Exam: Filed Vitals:   02/06/16 0500 02/06/16 0813 02/06/16 0815 02/06/16 0855  BP: 112/51   120/50  Pulse: 64   66  Temp: 97.6 F (36.4 C)     TempSrc: Oral     Resp: 18     Height:      Weight: 66.4 kg (146 lb 6.2  oz)     SpO2: 99% 98% 98%     General: Awake, in nad Cardiovascular: regular, s1, s2 Respiratory: normal resp effort, no wheezing  Discharge Instructions     Medication List    TAKE these medications        albuterol (2.5 MG/3ML) 0.083% nebulizer solution  Commonly known as:  PROVENTIL  Take 3 mLs (2.5 mg total) by nebulization 4 (four) times daily.     albuterol 108 (90 Base) MCG/ACT inhaler  Commonly known as:  PROVENTIL HFA;VENTOLIN HFA  Inhale 1-2 puffs into the lungs every 6 (six) hours as needed for wheezing.     ARIPiprazole 2 MG tablet  Commonly known as:  ABILIFY  Take 1 tablet (2 mg total) by mouth 2 (two) times daily.     aspirin EC 81 MG tablet  Take 81 mg by mouth daily.     budesonide 0.25 MG/2ML nebulizer solution  Commonly known as:  PULMICORT  Take 2 mLs (0.25 mg total) by nebulization 2 (two) times daily.     clopidogrel 75 MG tablet  Commonly known as:  PLAVIX  TAKE 1 TABLET BY MOUTH EVERY DAY     furosemide 20 MG tablet  Commonly known as:  LASIX  Take 20 mg by mouth daily. EDEMA     insulin glargine 100 UNIT/ML injection  Commonly known as:  LANTUS  Inject 0.1 mLs (10 Units total) into the skin daily.     insulin lispro 100 UNIT/ML  injection  Commonly known as:  HUMALOG  Inject 0-10 Units into the skin 3 (three) times daily before meals. Sliding scale: below 150=0 units, 150-200=2 units, 201-250=4 units, 251-300=6 units, 301-350=8 units, above 350=10 units     ipratropium 0.02 % nebulizer solution  Commonly known as:  ATROVENT  Take 2.5 mLs (0.5 mg total) by nebulization 4 (four) times daily. DX CODE J44.9     isosorbide mononitrate 30 MG 24 hr tablet  Commonly known as:  IMDUR  Take 0.5 tablets (15 mg total) by mouth daily.     LORazepam 0.5 MG tablet  Commonly known as:  ATIVAN  Take 1 tablet (0.5 mg total) by mouth 2 (two) times daily as needed for anxiety.     memantine 28 MG Cp24 24 hr capsule  Commonly known as:  NAMENDA XR   Take 1 capsule (28 mg total) by mouth daily.     metFORMIN 500 MG tablet  Commonly known as:  GLUCOPHAGE  Take 2,000 mg by mouth daily before supper. Reported on 12/22/2015     metoprolol succinate 25 MG 24 hr tablet  Commonly known as:  TOPROL XL  Take 0.5 tablets (12.5 mg total) by mouth daily.     mirtazapine 15 MG disintegrating tablet  Commonly known as:  REMERON SOL-TAB  Take 1 tablet (15 mg total) by mouth at bedtime.     mometasone-formoterol 100-5 MCG/ACT Aero  Commonly known as:  DULERA  Inhale 2 puffs into the lungs 2 (two) times daily.     nitroGLYCERIN 0.4 MG SL tablet  Commonly known as:  NITROSTAT  Place 1 tablet (0.4 mg total) under the tongue every 5 (five) minutes as needed. For chest pain.     omeprazole 20 MG capsule  Commonly known as:  PRILOSEC  Take 20 mg by mouth as needed (REFLUX).     oxyCODONE-acetaminophen 5-325 MG tablet  Commonly known as:  PERCOCET/ROXICET  Take 1 tablet by mouth every 6 (six) hours as needed for moderate pain or severe pain.     pravastatin 40 MG tablet  Commonly known as:  PRAVACHOL  TAKE 1 TABLET (40 MG TOTAL) BY MOUTH DAILY. <PLEASE MAKE APPOINTMENT FOR REFILLS>       Allergies  Allergen Reactions  . Lisinopril Other (See Comments)    REACTION: respiratory arrest jan 2009  . Spironolactone Other (See Comments)    PATIENT HAD SEVERE SIDE EFFECTS  . Donepezil Diarrhea  . Eggs Or Egg-Derived Products Diarrhea  . Erythromycin Other (See Comments)    Makes me feel weird   . Macrolides And Ketolides Other (See Comments)  . Penicillins Itching and Other (See Comments)    Too much as a kid  . Quinapril Hcl Itching and Other (See Comments)    Too much AS A KID  . Angiotensin Receptor Blockers Other (See Comments)    unknown  . Lipitor [Atorvastatin] Palpitations    FAST HEART RATE  . Pentazocine Lactate Other (See Comments)    Reaction unknown  . Propoxyphene Hcl Other (See Comments)    Reaction unknown  .  Sulfonamide Derivatives Other (See Comments)    Childhood allergy  . Vancomycin Rash    CLOSES THROAT. ANY MYCIN FAMILY   Follow-up Information    Follow up with WHITE,CYNTHIA S, MD. Schedule an appointment as soon as possible for a visit in 2 weeks.   Specialty:  Family Medicine   Why:  Hospital follow up   Contact information:   [id number]  [address] [place] [address] [phone]        The results of significant diagnostics from this hospitalization (including imaging, microbiology, ancillary and laboratory) are listed below for reference.    Significant Diagnostic Studies: Dg Chest 2 View  02/04/2016  CLINICAL DATA:  Initial evaluation for acute right chest pain today, shortness of breath. EXAM: CHEST  2 VIEW COMPARISON:  Prior radiograph from 01/01/2016. FINDINGS: Mild cardiomegaly is stable. Mediastinal silhouette within normal limits. Atheromatous plaque within the aortic arch. Hazy opacity at the left lung base on frontal projection likely largely related to mediastinal fat pad. No definite pleural effusion. No consolidative airspace disease. No pulmonary edema. No pneumothorax. No acute osseus abnormality. Multilevel degenerative spondylolysis present within the visualized spine. IMPRESSION: 1. No active cardiopulmonary disease. 2. Stable cardiomegaly without pulmonary edema. Electronically Signed   By: Jeannine Boga M.D.   On: 02/04/2016 02:42    Microbiology: No results found for this or any previous visit (from the past 240 hour(s)).   Labs: Basic Metabolic Panel:  Recent Labs Lab 02/04/16 0247 02/04/16 2258 02/05/16 0527  NA 143 139 142  K 4.0 3.7 4.0  CL 105 102 102  CO2 25 27 28   GLUCOSE 172* 224* 178*  BUN 18 20 21*  CREATININE 0.83 0.97 0.92  CALCIUM 8.9 8.8* 9.3  MG  --  1.5* 2.2   Liver Function Tests: No results for input(s): AST, ALT, ALKPHOS, BILITOT, PROT, ALBUMIN in the last 168 hours. No results for input(s): LIPASE, AMYLASE  in the last 168 hours. No results for input(s): AMMONIA in the last 168 hours. CBC:  Recent Labs Lab 02/04/16 0140 02/05/16 0520  WBC 10.8* 8.6  HGB 14.0 13.2  HCT 42.2 40.3  MCV 97.0 96.9  PLT 151 154   Cardiac Enzymes:  Recent Labs Lab 02/04/16 0454 02/04/16 1022 02/05/16 0925  TROPONINI <0.03 <0.03 0.05*   BNP: BNP (last 3 results)  Recent Labs  03/31/15 0949 10/07/15 0015 01/02/16 0142  BNP 118.2* 23.9 71.8    ProBNP (last 3 results) No results for input(s): PROBNP in the last 8760 hours.  CBG:  Recent Labs Lab 02/05/16 0735 02/05/16 1125 02/05/16 1636 02/05/16 2127 02/06/16 0802  GLUCAP 158* 180* 229* 203* 167*    Signed:  CHIU, STEPHEN K  [facility] 02/06/2016, 9:55 AM

## 2016-02-05 NOTE — Progress Notes (Addendum)
CM attempted to call daughter  @ [phone] to discuss obs letter as per MD note, patient is A & O x 1. No answer but Cm left VM requesting callback.

## 2016-02-05 NOTE — Progress Notes (Signed)
Notified by central telemetry pt had episode of bradycardia with HR in 30s with 3 sec poss, pt sleeping at time, awoke to voice, remains sleepy with no other symptoms, denies pain discomfort SOB, PA notified, will continue to monitor closely.  Edward Qualia RN

## 2016-02-05 NOTE — Progress Notes (Signed)
CSW spoke with unit RN when it was discovered this evening that patient was not d/c'd this afternoon back to facility.  She stated that facility refused to take patient back because she did not have a nebulizer machine.  CSW spoke with [place] at [facility] who stated that patient was admitted to their facility 4 or 5 days ago and she was prescribed nebulizer meds but never got a nebulizer. She is unsure "why" this happened.  CSW discussed d/c delay and that we were not notified earlier today that this was an issue; otherwise the situation could have been resolved and d/c completed. Due to the lateness of the hour- it is impossible to obtain a nebulizer machine tonight.  CSW discussed with unit RN to leave report for first shift nurse to contact hospital RNCM to arrange with MD to get a nebulizer machine in the morning so that patient can return to facility. Verdene Lennert is agreeable to this and states she will follow up with her facility as to why a nebulizer was not obtained prior.  CSW for weekend will need to complete d/c again in the morning.  Lorie Phenix. Murrell Redden [phone] (weekened coverage).

## 2016-02-05 NOTE — Progress Notes (Signed)
  [name] is a 75 y.o. female with a hx of severe dementia, HTN, HLD, DMII, chronic systolic heart failure, COPD, tobacco abuse, and CAD s/p PCI to RCA in 2012 who was admitted with chest pain.  She is DNR. CEs neg. EF 45% in the past. Repeat Echo pending. Patient with 7 beats NSVT last night. Today, RN called with HRs in the 30s while sleeping and 3 sec pause. Patient was awoken by RN.  No complaints reported. K+ 4, Mg2+ 2.2. She has already received Toprol-XL 25 this AM. HR now in 60-70s. Ok to get ECG now. Would continue to monitor for now. [name], PA-C   02/05/2016 11:30 AM

## 2016-02-05 NOTE — Progress Notes (Signed)
  Echocardiogram 2D Echocardiogram has been performed.  Donata Clay 02/05/2016, 10:20 AM

## 2016-02-05 NOTE — Progress Notes (Signed)
    Subjective:  Denies CP or dyspnea; A and O x 1.   Objective:  Filed Vitals:   02/04/16 2111 02/05/16 0000 02/05/16 0450 02/05/16 0754  BP:  127/67 114/52 114/43  Pulse: 73 79 70 74  Temp:  98.2 F (36.8 C) 97.6 F (36.4 C) 98 F (36.7 C)  TempSrc:  Oral Oral Oral  Resp: 18 20 18 18   Height:      Weight:   146 lb 2.6 oz (66.3 kg)   SpO2:  99% 98% 96%    Intake/Output from previous day:  Intake/Output Summary (Last 24 hours) at 02/05/16 1145 Last data filed at 02/05/16 0836  Gross per 24 hour  Intake    120 ml  Output    100 ml  Net     20 ml    Physical Exam: Physical exam: Well-developed well-nourished in no acute distress.  Skin is warm and dry.  HEENT is normal.  Neck is supple.  Chest is clear to auscultation with normal expansion.  Cardiovascular exam is regular rate and rhythm.  Abdominal exam nontender or distended. No masses palpated. Extremities show no edema. neuro A and O x 1    Lab Results: Basic Metabolic Panel:  Recent Labs  02/04/16 2258 02/05/16 0527  NA 139 142  K 3.7 4.0  CL 102 102  CO2 27 28  GLUCOSE 224* 178*  BUN 20 21*  CREATININE 0.97 0.92  CALCIUM 8.8* 9.3  MG 1.5* 2.2   CBC:  Recent Labs  02/04/16 0140 02/05/16 0520  WBC 10.8* 8.6  HGB 14.0 13.2  HCT 42.2 40.3  MCV 97.0 96.9  PLT 151 154   Cardiac Enzymes:  Recent Labs  02/04/16 0454 02/04/16 1022 02/05/16 0925  TROPONINI <0.03 <0.03 0.05*     Assessment/Plan:  1 chest pain-patient denies chest pain at present. Electrocardiogram shows no ST changes. Troponin is flat. I do not think 0.05 is consistent with an acute coronary syndrome. Given dementia I do not think she is a good candidate for aggressive cardiac evaluation. Would treat medically. Continue aspirin, Plavix and statin. 2 Bradycardia-patient had pause of 3 seconds and heart rates in the 30s while sleeping. Decrease metoprolol to 12.5 mg daily. I would like to continue low dose and she did  have nonsustained ventricular tachycardia on telemetry. 3 dementia 4 hypertension-continue present medications. 5 hyperlipidemia-continue statin. Other issues per primary care. We will sign off. Please call with questions. Kirk Ruths 02/05/2016, 11:45 AM

## 2016-02-06 LAB — GLUCOSE, CAPILLARY
GLUCOSE-CAPILLARY: 167 mg/dL — AB (ref 65–99)
Glucose-Capillary: 278 mg/dL — ABNORMAL HIGH (ref 65–99)

## 2016-02-06 NOTE — Progress Notes (Signed)
[facility] PROGRESS NOTE  SHENEQUIA WEEDON [id number] DOB: [birth year] DOA: 02/04/2016 PCP: Vidal Schwalbe, MD  HPI/Brief narrative Please review dictated H and P from 3/17 for details. Briefly, 75 year old female with a past medical history significant for CAD, ischemic cardiomyopathy, HTN, HLD, COPD, GERD, anxiety, dementia; who presents with complaints of shortness of breath and chest pain. Patient was admitted for further work up  Assessment/Plan: Chest pain - Trop remained unremarkable - Pt seen by Cardiology in consultation - Cardiology recs for continued medical management and has since signed off - Patient remained stable this admission - Discharge held overnight secondary to no nebulizer at facility. Per below, DME for nebulizer placed  Leukocytosis - UA with numerous WBC but also with moderate blood. Repeat UA is unremarkable - Remained afebrile - CXR clear - Leukocytosis resolved  HTN - BP stable  COPD - on min o2 support - Stable - Nebulizer DME ordered  DM2 - cont SSI coverage while inpatient  Dementia - Seems stable  DVT prophylaxis - Lovenox while admitted  Code Status: DNR Family Communication: Pt in room Disposition Plan: Discharge to facility today   Consultants:  Cardiology  Antibiotics: Anti-infectives    None      HPI/Subjective: No complaints  Objective: Filed Vitals:   02/06/16 0500 02/06/16 0813 02/06/16 0815 02/06/16 0855  BP: 112/51   120/50  Pulse: 64   66  Temp: 97.6 F (36.4 C)     TempSrc: Oral     Resp: 18     Height:      Weight: 66.4 kg (146 lb 6.2 oz)     SpO2: 99% 98% 98%     Intake/Output Summary (Last 24 hours) at 02/06/16 0954 Last data filed at 02/05/16 2100  Gross per 24 hour  Intake    435 ml  Output    201 ml  Net    234 ml   Filed Weights   02/04/16 0126 02/05/16 0450 02/06/16 0500  Weight: 68.04 kg (150 lb) 66.3 kg (146 lb 2.6 oz) 66.4 kg (146 lb 6.2 oz)    Exam:   General:   Awake, in nad  Cardiovascular: regular, s1, s2  Respiratory: normal resp effort, no wheezing  Abdomen: soft, nondistended  Musculoskeletal: perfused, no clubbing   Data Reviewed: Basic Metabolic Panel:  Recent Labs Lab 02/04/16 0247 02/04/16 2258 02/05/16 0527  NA 143 139 142  K 4.0 3.7 4.0  CL 105 102 102  CO2 25 27 28   GLUCOSE 172* 224* 178*  BUN 18 20 21*  CREATININE 0.83 0.97 0.92  CALCIUM 8.9 8.8* 9.3  MG  --  1.5* 2.2   Liver Function Tests: No results for input(s): AST, ALT, ALKPHOS, BILITOT, PROT, ALBUMIN in the last 168 hours. No results for input(s): LIPASE, AMYLASE in the last 168 hours. No results for input(s): AMMONIA in the last 168 hours. CBC:  Recent Labs Lab 02/04/16 0140 02/05/16 0520  WBC 10.8* 8.6  HGB 14.0 13.2  HCT 42.2 40.3  MCV 97.0 96.9  PLT 151 154   Cardiac Enzymes:  Recent Labs Lab 02/04/16 0454 02/04/16 1022 02/05/16 0925  TROPONINI <0.03 <0.03 0.05*   BNP (last 3 results)  Recent Labs  03/31/15 0949 10/07/15 0015 01/02/16 0142  BNP 118.2* 23.9 71.8    ProBNP (last 3 results) No results for input(s): PROBNP in the last 8760 hours.  CBG:  Recent Labs Lab 02/05/16 0735 02/05/16 1125 02/05/16 1636 02/05/16 2127 02/06/16 0802  GLUCAP 158*  180* 229* 203* 167*    No results found for this or any previous visit (from the past 240 hour(s)).   Studies: No results found.  Scheduled Meds: . albuterol  2.5 mg Nebulization QID  . ARIPiprazole  2 mg Oral BID  . aspirin EC  81 mg Oral Daily  . budesonide  0.25 mg Nebulization BID  . clopidogrel  75 mg Oral Daily  . enoxaparin (LOVENOX) injection  40 mg Subcutaneous Daily  . furosemide  20 mg Oral Daily  . insulin aspart  0-9 Units Subcutaneous TID WC  . insulin glargine  10 Units Subcutaneous QHS  . isosorbide mononitrate  15 mg Oral Daily  . memantine  28 mg Oral Daily  . metoprolol succinate  25 mg Oral Daily  . mirtazapine  15 mg Oral QHS  . pantoprazole   40 mg Oral Daily  . pravastatin  40 mg Oral q1800   Continuous Infusions:   Principal Problem:   Chest pain Active Problems:   DM2 (diabetes mellitus, type 2) ([facility])   Essential hypertension, benign   HLD (hyperlipidemia)   COPD (chronic obstructive pulmonary disease) ([place])   Senile dementia without behavioral disturbance   Nikki Rusnak, [place] Pager [phone]. If 7PM-7AM, please contact night-coverage at [email or web address], password [email or web address] 02/06/2016, 9:54 AM

## 2016-02-06 NOTE — Progress Notes (Signed)
Per RNCM, nebulizer delivered to patient's room.   Patient will DC to: [facility] Anticipated DC date: 02/06/16 Family notified: LM for Daughter Transport by: PTAR  CSW signing off.  Cedric Fishman, [place] Social Worker [phone]

## 2016-02-06 NOTE — Progress Notes (Addendum)
CM receives call from RN notifying pt not going to [facility] as facility has no nebulizer machine.  CM called [facility] DME rep, Merry Proud to please deliver a nebulizer machine to room so pt can discharger.  CM called CSW, Percell Locus to please arrange for transportation to facility once DME rep, Merry Proud has delivered the neb machine.  No other CM needs were communicated.

## 2016-02-28 ENCOUNTER — Emergency Department (HOSPITAL_COMMUNITY): Payer: Medicare Other

## 2016-02-28 ENCOUNTER — Emergency Department (HOSPITAL_COMMUNITY)
Admission: EM | Admit: 2016-02-28 | Discharge: 2016-02-28 | Disposition: A | Discharge status: Home / Self Care | Payer: Medicare Other | Attending: Emergency Medicine | Admitting: Emergency Medicine

## 2016-02-28 ENCOUNTER — Encounter (HOSPITAL_COMMUNITY): Payer: Self-pay | Admitting: *Deleted

## 2016-02-28 DIAGNOSIS — Z7984 Long term (current) use of oral hypoglycemic drugs: Secondary | ICD-10-CM | POA: Insufficient documentation

## 2016-02-28 DIAGNOSIS — F0390 Unspecified dementia without behavioral disturbance: Secondary | ICD-10-CM | POA: Insufficient documentation

## 2016-02-28 DIAGNOSIS — M1990 Unspecified osteoarthritis, unspecified site: Secondary | ICD-10-CM | POA: Diagnosis not present

## 2016-02-28 DIAGNOSIS — Z9851 Tubal ligation status: Secondary | ICD-10-CM | POA: Diagnosis not present

## 2016-02-28 DIAGNOSIS — I2510 Atherosclerotic heart disease of native coronary artery without angina pectoris: Secondary | ICD-10-CM | POA: Diagnosis not present

## 2016-02-28 DIAGNOSIS — Z79899 Other long term (current) drug therapy: Secondary | ICD-10-CM | POA: Insufficient documentation

## 2016-02-28 DIAGNOSIS — J449 Chronic obstructive pulmonary disease, unspecified: Secondary | ICD-10-CM | POA: Insufficient documentation

## 2016-02-28 DIAGNOSIS — Z794 Long term (current) use of insulin: Secondary | ICD-10-CM | POA: Insufficient documentation

## 2016-02-28 DIAGNOSIS — F419 Anxiety disorder, unspecified: Secondary | ICD-10-CM | POA: Insufficient documentation

## 2016-02-28 DIAGNOSIS — E785 Hyperlipidemia, unspecified: Secondary | ICD-10-CM | POA: Insufficient documentation

## 2016-02-28 DIAGNOSIS — R109 Unspecified abdominal pain: Secondary | ICD-10-CM | POA: Diagnosis not present

## 2016-02-28 DIAGNOSIS — K219 Gastro-esophageal reflux disease without esophagitis: Secondary | ICD-10-CM | POA: Diagnosis not present

## 2016-02-28 DIAGNOSIS — Z9889 Other specified postprocedural states: Secondary | ICD-10-CM | POA: Insufficient documentation

## 2016-02-28 DIAGNOSIS — Z7902 Long term (current) use of antithrombotics/antiplatelets: Secondary | ICD-10-CM | POA: Diagnosis not present

## 2016-02-28 DIAGNOSIS — Z7951 Long term (current) use of inhaled steroids: Secondary | ICD-10-CM | POA: Insufficient documentation

## 2016-02-28 DIAGNOSIS — Z87442 Personal history of urinary calculi: Secondary | ICD-10-CM | POA: Insufficient documentation

## 2016-02-28 DIAGNOSIS — E663 Overweight: Secondary | ICD-10-CM | POA: Insufficient documentation

## 2016-02-28 DIAGNOSIS — I10 Essential (primary) hypertension: Secondary | ICD-10-CM | POA: Diagnosis not present

## 2016-02-28 DIAGNOSIS — Z8669 Personal history of other diseases of the nervous system and sense organs: Secondary | ICD-10-CM | POA: Insufficient documentation

## 2016-02-28 DIAGNOSIS — R1031 Right lower quadrant pain: Secondary | ICD-10-CM | POA: Diagnosis present

## 2016-02-28 DIAGNOSIS — F17210 Nicotine dependence, cigarettes, uncomplicated: Secondary | ICD-10-CM | POA: Diagnosis not present

## 2016-02-28 DIAGNOSIS — Z880 Allergy status to penicillin: Secondary | ICD-10-CM | POA: Diagnosis not present

## 2016-02-28 DIAGNOSIS — Z7982 Long term (current) use of aspirin: Secondary | ICD-10-CM | POA: Diagnosis not present

## 2016-02-28 LAB — URINALYSIS, ROUTINE W REFLEX MICROSCOPIC
Bilirubin Urine: NEGATIVE
Glucose, UA: NEGATIVE mg/dL
Hgb urine dipstick: NEGATIVE
KETONES UR: NEGATIVE mg/dL
LEUKOCYTES UA: NEGATIVE
NITRITE: NEGATIVE
PROTEIN: NEGATIVE mg/dL
Specific Gravity, Urine: 1.011 (ref 1.005–1.030)
pH: 7 (ref 5.0–8.0)

## 2016-02-28 LAB — COMPREHENSIVE METABOLIC PANEL
ALK PHOS: 60 U/L (ref 38–126)
ALT: 11 U/L — AB (ref 14–54)
AST: 14 U/L — ABNORMAL LOW (ref 15–41)
Albumin: 3.8 g/dL (ref 3.5–5.0)
Anion gap: 9 (ref 5–15)
BILIRUBIN TOTAL: 0.6 mg/dL (ref 0.3–1.2)
BUN: 24 mg/dL — AB (ref 6–20)
CALCIUM: 9.1 mg/dL (ref 8.9–10.3)
CHLORIDE: 101 mmol/L (ref 101–111)
CO2: 32 mmol/L (ref 22–32)
CREATININE: 0.86 mg/dL (ref 0.44–1.00)
Glucose, Bld: 97 mg/dL (ref 65–99)
Potassium: 3.8 mmol/L (ref 3.5–5.1)
Sodium: 142 mmol/L (ref 135–145)
TOTAL PROTEIN: 7 g/dL (ref 6.5–8.1)

## 2016-02-28 LAB — CBC
HCT: 39.5 % (ref 36.0–46.0)
Hemoglobin: 13.1 g/dL (ref 12.0–15.0)
MCH: 31.8 pg (ref 26.0–34.0)
MCHC: 33.2 g/dL (ref 30.0–36.0)
MCV: 95.9 fL (ref 78.0–100.0)
PLATELETS: 171 10*3/uL (ref 150–400)
RBC: 4.12 MIL/uL (ref 3.87–5.11)
RDW: 12.8 % (ref 11.5–15.5)
WBC: 9.7 10*3/uL (ref 4.0–10.5)

## 2016-02-28 LAB — I-STAT CG4 LACTIC ACID, ED: LACTIC ACID, VENOUS: 1.6 mmol/L (ref 0.5–2.0)

## 2016-02-28 LAB — LIPASE, BLOOD: LIPASE: 20 U/L (ref 11–51)

## 2016-02-28 MED ORDER — SODIUM CHLORIDE 0.9 % IV SOLN
INTRAVENOUS | Status: DC
  Administered 2016-02-28: 14:00:00 via INTRAVENOUS

## 2016-02-28 MED ORDER — IOHEXOL 300 MG/ML  SOLN
50.0000 mL | Freq: Once | INTRAMUSCULAR | Status: AC | PRN
  Administered 2016-02-28: 25 mL via ORAL

## 2016-02-28 MED ORDER — IOPAMIDOL (ISOVUE-300) INJECTION 61%
100.0000 mL | Freq: Once | INTRAVENOUS | Status: AC | PRN
  Administered 2016-02-28: 100 mL via INTRAVENOUS

## 2016-02-28 NOTE — ED Notes (Signed)
Bed: WA14 Expected date:  Expected time:  Means of arrival:  Comments: 

## 2016-02-28 NOTE — Discharge Instructions (Signed)
Take your usual prescriptions as previously directed.  Call your regular medical doctor today to schedule a follow up appointment within the next 2 days.  Return to the Emergency Department immediately sooner if worsening.  ° °

## 2016-02-28 NOTE — ED Provider Notes (Signed)
CSN: [id number]     Arrival date & time 02/28/16  1300 History   First MD Initiated Contact with Patient 02/28/16 1339     Chief Complaint  Patient presents with  . Abdominal Pain      Patient is a 75 y.o. female presenting with abdominal pain. The history is provided by the patient, the nursing home and the EMS personnel. The history is limited by the condition of the patient (Hx dementia).  Abdominal Pain Pt was seen at 1340. Per EMS, NH report and pt: NH states pt has been c/o RLQ abd pain for the past 4 days. No reported N/V/D, fevers. Denies dysuria. Pt herself has hx of dementia and states she has had "this pain forever" and was sent to the ED today "because they got tired of me complaining about it."    Past Medical History  Diagnosis Date  . CAD (coronary artery disease)     a. s/p inferior STEMI 12/29/10 with rotablator atherectomy RCA 12/31/10 and DES x 2 RCA;  b. Lexiscan Myoview (02/2015): mild reversible apical anterior perfusion defect. C. cath 02/2015 50% LAD lesion with patent stent, Tx Rx    . Ischemic cardiomyopathy     EF 45%cath 2012 EF55% 5/12; 35% 11/14  . Diabetes mellitus     type 2  . HTN (hypertension)   . Hyperlipidemia   . Diverticulosis   . Respiratory arrest//ACE Inhibitor presumed cause   . COPD (chronic obstructive pulmonary disease) ([place])   . Small bowel obstruction (HCC)     from a sigmoid stricture  . Nephrolithiasis   . Sleep apnea   . Overweight(278.02)   . Osteoarthritis   . GERD (gastroesophageal reflux disease)   . Anxiety   . Glaucoma   . Memory deficit   . Dementia    Past Surgical History  Procedure Laterality Date  . Bilateral tubal ligation    . Lithotripsy    . 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 Cardiac surgery    . Cardiac catheterization N/A 03/18/2015    Procedure: LEFT HEART CATH AND CORONARY ANGIOGRAPHY;  Surgeon: Lorretta Harp, MD;  Location: [facility] CATH LAB;  Service: Cardiovascular;   Laterality: N/A;   Family History  Problem Relation Age of Onset  . Breast cancer Maternal Grandmother   . Diabetes Father   . Heart disease Father   . Diabetes Sister   . Colon cancer Neg Hx   . Heart attack Father   . Hypertension Sister   . Stroke Mother    Social History  Substance Use Topics  . Smoking status: Current Every Day Smoker -- 1.00 packs/day for 25 years    Types: Cigarettes  . Smokeless tobacco: Never Used     Comment: over a ppd for 40+ years; quit 10/2004.  unsuccessfully tried eCigs.  . Alcohol Use: No    Review of Systems  Unable to perform ROS: Dementia  Gastrointestinal: Positive for abdominal pain.      Allergies  Lisinopril; Spironolactone; Donepezil; Eggs or egg-derived products; Erythromycin; Macrolides and ketolides; Penicillins; Quinapril hcl; Angiotensin receptor blockers; Lipitor; Pentazocine lactate; Propoxyphene hcl; Sulfonamide derivatives; and Vancomycin  Home Medications   Prior to Admission medications   Medication Sig Start Date End Date Taking? Authorizing Provider  albuterol (PROVENTIL HFA;VENTOLIN HFA) 108 (90 BASE) MCG/ACT inhaler Inhale 1-2 puffs into the lungs every 6 (six) hours as needed for wheezing.    Yes Historical Provider, MD  albuterol (PROVENTIL) (  2.5 MG/3ML) 0.083% nebulizer solution Take 3 mLs (2.5 mg total) by nebulization 4 (four) times daily. 07/22/15  Yes Brand Males, MD  aspirin EC 81 MG tablet Take 81 mg by mouth daily.   Yes Historical Provider, MD  atorvastatin (LIPITOR) 40 MG tablet Take 40 mg by mouth daily.   Yes Historical Provider, MD  clopidogrel (PLAVIX) 75 MG tablet TAKE 1 TABLET BY MOUTH EVERY DAY 11/11/14  Yes Burnell Blanks, MD  furosemide (LASIX) 20 MG tablet Take 20 mg by mouth daily. EDEMA   Yes Historical Provider, MD  insulin glargine (LANTUS) 100 UNIT/ML injection Inject 0.1 mLs (10 Units total) into the skin daily. Patient taking differently: Inject 14 Units into the skin at  bedtime.  01/25/14  Yes Hosie Poisson, MD  insulin lispro (HUMALOG) 100 UNIT/ML injection Inject 4 Units into the skin 3 (three) times daily with meals.    Yes Historical Provider, MD  ipratropium (ATROVENT) 0.02 % nebulizer solution Take 2.5 mLs (0.5 mg total) by nebulization 4 (four) times daily. DX CODE J44.9 Patient taking differently: Take 0.5 mg by nebulization every 6 (six) hours.  07/22/15  Yes Brand Males, MD  isosorbide mononitrate (IMDUR) 30 MG 24 hr tablet Take 0.5 tablets (15 mg total) by mouth daily. 10/25/15  Yes Isaiah Serge, NP  LORazepam (ATIVAN) 0.5 MG tablet Take 1 tablet (0.5 mg total) by mouth 2 (two) times daily as needed for anxiety. 01/04/16  Yes Shanker Kristeen Mans, MD  memantine (NAMENDA XR) 28 MG CP24 24 hr capsule Take 1 capsule (28 mg total) by mouth daily. 01/04/16  Yes Shanker Kristeen Mans, MD  metFORMIN (GLUCOPHAGE) 1000 MG tablet Take 1,000 mg by mouth 2 (two) times daily.   Yes Historical Provider, MD  metoprolol succinate (TOPROL XL) 25 MG 24 hr tablet Take 0.5 tablets (12.5 mg total) by mouth daily. 02/05/16  Yes Donne Hazel, MD  mirtazapine (REMERON SOL-TAB) 15 MG disintegrating tablet Take 1 tablet (15 mg total) by mouth at bedtime. 01/04/16  Yes Shanker Kristeen Mans, MD  mometasone-formoterol (DULERA) 100-5 MCG/ACT AERO Inhale 2 puffs into the lungs 2 (two) times daily.   Yes Historical Provider, MD  nitroGLYCERIN (NITROSTAT) 0.4 MG SL tablet Place 1 tablet (0.4 mg total) under the tongue every 5 (five) minutes as needed. For chest pain. 06/18/13  Yes Burnell Blanks, MD  omeprazole (PRILOSEC) 20 MG capsule Take 20 mg by mouth daily.    Yes Historical Provider, MD  oxyCODONE-acetaminophen (PERCOCET/ROXICET) 5-325 MG tablet Take 1 tablet by mouth every 6 (six) hours as needed for moderate pain or severe pain. 01/04/16  Yes Shanker Kristeen Mans, MD   BP 144/76 mmHg  Pulse 90  Temp(Src) 98.2 F (36.8 C) (Oral)  Resp 16  SpO2 93% Physical Exam 1345: Physical  examination:  Nursing notes reviewed; Vital signs and O2 SAT reviewed;  Constitutional: Well developed, Well nourished, Well hydrated, In no acute distress; Head:  Normocephalic, atraumatic; Eyes: EOMI, PERRL, No scleral icterus; ENMT: Mouth and pharynx normal, Mucous membranes moist; Neck: Supple, Full range of motion, No lymphadenopathy; Cardiovascular: Regular rate and rhythm, No gallop; Respiratory: Breath sounds clear & equal bilaterally, No wheezes.  Speaking full sentences with ease, Normal respiratory effort/excursion; Chest: Nontender, Movement normal; Abdomen: Soft, +large abd with right sided tenderness to palp. Nondistended, Normal bowel sounds; Genitourinary: No CVA tenderness; Extremities: Pulses normal, No tenderness, No edema, No calf edema or asymmetry.; Neuro: Awake, alert, confused per hx dementia. Speech clear. Moves  extremities on stretcher..; Skin: Color normal, Warm, Dry.   ED Course  Procedures (including critical care time) Labs Review   Imaging Review  I have personally reviewed and evaluated these images and lab results as part of my medical decision-making.   EKG Interpretation None      MDM  MDM Reviewed: previous chart, nursing note and vitals Reviewed previous: labs Interpretation: labs      Results for orders placed or performed during the hospital encounter of 02/28/16  Lipase, blood  Result Value Ref Range   Lipase 20 11 - 51 U/L  Comprehensive metabolic panel  Result Value Ref Range   Sodium 142 135 - 145 mmol/L   Potassium 3.8 3.5 - 5.1 mmol/L   Chloride 101 101 - 111 mmol/L   CO2 32 22 - 32 mmol/L   Glucose, Bld 97 65 - 99 mg/dL   BUN 24 (H) 6 - 20 mg/dL   Creatinine, Ser 0.86 0.44 - 1.00 mg/dL   Calcium 9.1 8.9 - 10.3 mg/dL   Total Protein 7.0 6.5 - 8.1 g/dL   Albumin 3.8 3.5 - 5.0 g/dL   AST 14 (L) 15 - 41 U/L   ALT 11 (L) 14 - 54 U/L   Alkaline Phosphatase 60 38 - 126 U/L   Total Bilirubin 0.6 0.3 - 1.2 mg/dL   GFR calc non Af Amer  >60 >60 mL/min   GFR calc Af Amer >60 >60 mL/min   Anion gap 9 5 - 15  CBC  Result Value Ref Range   WBC 9.7 4.0 - 10.5 K/uL   RBC 4.12 3.87 - 5.11 MIL/uL   Hemoglobin 13.1 12.0 - 15.0 g/dL   HCT 39.5 36.0 - 46.0 %   MCV 95.9 78.0 - 100.0 fL   MCH 31.8 26.0 - 34.0 pg   MCHC 33.2 30.0 - 36.0 g/dL   RDW 12.8 11.5 - 15.5 %   Platelets 171 150 - 400 K/uL  Urinalysis, Routine w reflex microscopic (not at [facility])  Result Value Ref Range   Color, Urine YELLOW YELLOW   APPearance CLOUDY (A) CLEAR   Specific Gravity, Urine 1.011 1.005 - 1.030   pH 7.0 5.0 - 8.0   Glucose, UA NEGATIVE NEGATIVE mg/dL   Hgb urine dipstick NEGATIVE NEGATIVE   Bilirubin Urine NEGATIVE NEGATIVE   Ketones, ur NEGATIVE NEGATIVE mg/dL   Protein, ur NEGATIVE NEGATIVE mg/dL   Nitrite NEGATIVE NEGATIVE   Leukocytes, UA NEGATIVE NEGATIVE  I-Stat CG4 Lactic Acid, ED  Result Value Ref Range   Lactic Acid, Venous 1.60 0.5 - 2.0 mmol/L   Ct Abdomen Pelvis W Contrast 02/28/2016  CLINICAL DATA:  RIGHT lower quadrant pain for 4 days, nonradiating, history of coronary disease, ischemic cardiomyopathy, diabetes mellitus, hypertension, COPD, nephrolithiasis, diverticulosis, fibromyalgia, chronic systolic heart failure EXAM: CT ABDOMEN AND PELVIS WITH CONTRAST TECHNIQUE: Multidetector CT imaging of the abdomen and pelvis was performed using the standard protocol following bolus administration of intravenous contrast. Sagittal and coronal MPR images reconstructed from axial data set. CONTRAST:  171mL ISOVUE-300 IOPAMIDOL (ISOVUE-300) INJECTION 61% IV. Dilute oral contrast. COMPARISON:  01/02/2016 FINDINGS: Minimal atelectasis LEFT lung base. 9 mm intermediate attenuation RIGHT renal lesion image 35. Liver, spleen, pancreas, kidneys, and adrenal glands otherwise normal. Contracted gallbladder. Scattered atherosclerotic calcifications including coronary arteries. Unremarkable uterus and adnexa. Bladder and ureters normal appearance.  Appendix not identified. Prior sigmoid resection with staple line. Stomach and bowel loops otherwise normal appearance. No mass, adenopathy, free air, free fluid, or  inflammatory process. Bones demineralized with chronic superior endplate compression deformity of L5. IMPRESSION: Stable intermediate attenuation lesion within the mid RIGHT kidney since 05/06/2015, question minimally complicated cyst. Otherwise negative exam. Electronically Signed   By: Lavonia Dana M.D.   On: 02/28/2016 15:48    1550:  Workup reassuring. Has tol PO well while in the ED without N/V. No stooling while in the ED. Will d/c back to NH, stable.    Francine Graven, DO 03/03/16 [phone]

## 2016-02-28 NOTE — ED Notes (Signed)
Per EMS, pt from [facility] assisted living, reports RLQ pain x 4 days, non-radiating. Denies n/v.  Pt is A&Ox 4.

## 2016-02-28 NOTE — ED Notes (Signed)
[facility] contacted for transport 

## 2016-03-01 LAB — URINE CULTURE

## 2016-03-02 ENCOUNTER — Telehealth (HOSPITAL_BASED_OUTPATIENT_CLINIC_OR_DEPARTMENT_OTHER): Payer: Self-pay | Admitting: Emergency Medicine

## 2016-03-02 NOTE — Telephone Encounter (Signed)
Post ED Visit - Positive Culture Follow-up  Culture report reviewed by antimicrobial stewardship pharmacist:  []  Elenor Quinones, Pharm.D. []  Heide Guile, Pharm.D., BCPS []  Parks Neptune, Pharm.D. []  Alycia Rossetti, Pharm.D., BCPS []  [place], Pharm.D., BCPS, AAHIVP []  Legrand Como, Pharm.D., BCPS, AAHIVP []  Milus Glazier, Pharm.D. []  Stephens November, Pharm.Leonette Nutting PharmD  Positive urine culture Treated with none, asymptomatic, no further patient follow-up is required at this time.  Hazle Nordmann 03/02/2016, 9:28 AM

## 2016-03-02 NOTE — Progress Notes (Signed)
ED Antimicrobial Stewardship Positive Culture Follow Up   [name] is an 75 y.o. female who presented to [facility] on 02/28/2016 with a chief complaint of  Chief Complaint  Patient presents with  . Abdominal Pain    Recent Results (from the past 720 hour(s))  Urine culture     Status: Abnormal   Collection Time: 02/28/16  1:40 PM  Result Value Ref Range Status   Specimen Description URINE, CLEAN CATCH  Final   Special Requests NONE  Final   Culture >=100,000 COLONIES/mL ESCHERICHIA COLI (A)  Final   Report Status 03/01/2016 FINAL  Final   Organism ID, Bacteria ESCHERICHIA COLI (A)  Final      Susceptibility   Escherichia coli - MIC*    AMPICILLIN <=2 SENSITIVE Sensitive     CEFAZOLIN <=4 SENSITIVE Sensitive     CEFTRIAXONE <=1 SENSITIVE Sensitive     CIPROFLOXACIN <=0.25 SENSITIVE Sensitive     GENTAMICIN <=1 SENSITIVE Sensitive     IMIPENEM <=0.25 SENSITIVE Sensitive     NITROFURANTOIN <=16 SENSITIVE Sensitive     TRIMETH/SULFA <=20 SENSITIVE Sensitive     AMPICILLIN/SULBACTAM <=2 SENSITIVE Sensitive     PIP/TAZO <=4 SENSITIVE Sensitive     * >=100,000 COLONIES/mL ESCHERICHIA COLI   No signs/symptoms of UTI, UA clear. No treatment indicated.  ED Provider: Harlene Ramus, PA-C   Liliane Shi 03/02/2016, 8:53 AM PharmD Student

## 2016-04-20 ENCOUNTER — Emergency Department (HOSPITAL_COMMUNITY): Payer: Medicare Other

## 2016-04-20 ENCOUNTER — Emergency Department (HOSPITAL_COMMUNITY)
Admission: EM | Admit: 2016-04-20 | Discharge: 2016-04-21 | Disposition: A | Discharge status: Home / Self Care | Payer: Medicare Other | Attending: Emergency Medicine | Admitting: Emergency Medicine

## 2016-04-20 ENCOUNTER — Encounter (HOSPITAL_COMMUNITY): Payer: Self-pay | Admitting: Nurse Practitioner

## 2016-04-20 DIAGNOSIS — F17210 Nicotine dependence, cigarettes, uncomplicated: Secondary | ICD-10-CM | POA: Insufficient documentation

## 2016-04-20 DIAGNOSIS — Z7984 Long term (current) use of oral hypoglycemic drugs: Secondary | ICD-10-CM | POA: Insufficient documentation

## 2016-04-20 DIAGNOSIS — Z7902 Long term (current) use of antithrombotics/antiplatelets: Secondary | ICD-10-CM | POA: Diagnosis not present

## 2016-04-20 DIAGNOSIS — R51 Headache: Secondary | ICD-10-CM | POA: Insufficient documentation

## 2016-04-20 DIAGNOSIS — Z79899 Other long term (current) drug therapy: Secondary | ICD-10-CM | POA: Diagnosis not present

## 2016-04-20 DIAGNOSIS — I10 Essential (primary) hypertension: Secondary | ICD-10-CM | POA: Diagnosis not present

## 2016-04-20 DIAGNOSIS — R112 Nausea with vomiting, unspecified: Secondary | ICD-10-CM | POA: Insufficient documentation

## 2016-04-20 DIAGNOSIS — J449 Chronic obstructive pulmonary disease, unspecified: Secondary | ICD-10-CM | POA: Insufficient documentation

## 2016-04-20 DIAGNOSIS — F0390 Unspecified dementia without behavioral disturbance: Secondary | ICD-10-CM | POA: Insufficient documentation

## 2016-04-20 DIAGNOSIS — Z7982 Long term (current) use of aspirin: Secondary | ICD-10-CM | POA: Diagnosis not present

## 2016-04-20 DIAGNOSIS — I2510 Atherosclerotic heart disease of native coronary artery without angina pectoris: Secondary | ICD-10-CM | POA: Diagnosis not present

## 2016-04-20 DIAGNOSIS — R519 Headache, unspecified: Secondary | ICD-10-CM

## 2016-04-20 DIAGNOSIS — E119 Type 2 diabetes mellitus without complications: Secondary | ICD-10-CM | POA: Diagnosis not present

## 2016-04-20 DIAGNOSIS — Z794 Long term (current) use of insulin: Secondary | ICD-10-CM | POA: Insufficient documentation

## 2016-04-20 DIAGNOSIS — N390 Urinary tract infection, site not specified: Secondary | ICD-10-CM | POA: Insufficient documentation

## 2016-04-20 LAB — BASIC METABOLIC PANEL
ANION GAP: 9 (ref 5–15)
BUN: 19 mg/dL (ref 6–20)
CHLORIDE: 104 mmol/L (ref 101–111)
CO2: 27 mmol/L (ref 22–32)
Calcium: 8.8 mg/dL — ABNORMAL LOW (ref 8.9–10.3)
Creatinine, Ser: 0.91 mg/dL (ref 0.44–1.00)
GFR calc Af Amer: >60 mL/min (ref >60)
Glucose, Bld: 173 mg/dL — ABNORMAL HIGH (ref 65–99)
POTASSIUM: 3.9 mmol/L (ref 3.5–5.1)
SODIUM: 140 mmol/L (ref 135–145)

## 2016-04-20 LAB — URINE MICROSCOPIC-ADD ON

## 2016-04-20 LAB — URINALYSIS, ROUTINE W REFLEX MICROSCOPIC
BILIRUBIN URINE: NEGATIVE
Glucose, UA: NEGATIVE mg/dL
HGB URINE DIPSTICK: NEGATIVE
Ketones, ur: NEGATIVE mg/dL
Nitrite: POSITIVE — AB
PH: 5.5 (ref 5.0–8.0)
Protein, ur: NEGATIVE mg/dL
Specific Gravity, Urine: 1.027 (ref 1.005–1.030)

## 2016-04-20 LAB — CBC WITH DIFFERENTIAL/PLATELET
Basophils Absolute: 0 10*3/uL (ref 0.0–0.1)
Basophils Relative: 0 %
EOS ABS: 0.1 10*3/uL (ref 0.0–0.7)
EOS PCT: 1 %
HCT: 39.1 % (ref 36.0–46.0)
Hemoglobin: 13.1 g/dL (ref 12.0–15.0)
Lymphocytes Relative: 27 %
Lymphs Abs: 2.9 10*3/uL (ref 0.7–4.0)
MCH: 32.3 pg (ref 26.0–34.0)
MCHC: 33.5 g/dL (ref 30.0–36.0)
MCV: 96.3 fL (ref 78.0–100.0)
MONO ABS: 0.4 10*3/uL (ref 0.1–1.0)
MONOS PCT: 4 %
Neutro Abs: 7.4 10*3/uL (ref 1.7–7.7)
Neutrophils Relative %: 68 %
PLATELETS: 161 10*3/uL (ref 150–400)
RBC: 4.06 MIL/uL (ref 3.87–5.11)
RDW: 13 % (ref 11.5–15.5)
WBC: 10.8 10*3/uL — ABNORMAL HIGH (ref 4.0–10.5)

## 2016-04-20 MED ORDER — DIPHENHYDRAMINE HCL 50 MG/ML IJ SOLN
12.5000 mg | Freq: Once | INTRAMUSCULAR | Status: AC
  Administered 2016-04-20: 12.5 mg via INTRAVENOUS
  Filled 2016-04-20: qty 1

## 2016-04-20 MED ORDER — METOCLOPRAMIDE HCL 5 MG/ML IJ SOLN
10.0000 mg | INTRAMUSCULAR | Status: AC
  Administered 2016-04-20: 10 mg via INTRAVENOUS
  Filled 2016-04-20: qty 2

## 2016-04-20 MED ORDER — DEXTROSE 5 % IV SOLN
1.0000 g | Freq: Once | INTRAVENOUS | Status: AC
  Administered 2016-04-20: 1 g via INTRAVENOUS
  Filled 2016-04-20: qty 10

## 2016-04-20 MED ORDER — CEPHALEXIN 500 MG PO CAPS: 500.0000 mg | ORAL_CAPSULE | Freq: Two times a day (BID) | ORAL | Status: DC

## 2016-04-20 NOTE — ED Provider Notes (Signed)
CSN: [id number]     Arrival date & time 04/20/16  1943 History   First MD Initiated Contact with Patient 04/20/16 2008     Chief Complaint  Patient presents with  . Emesis  . Nausea  . Headache    LEVEL 5 CAVEAT SECONDARY TO DEMENTIA  (Consider location/radiation/quality/duration/timing/severity/associated sxs/prior Treatment) HPI Comments: 75 year old female with history of coronary artery disease, ischemic cardiomyopathy, diabetes mellitus, hypertension, dyslipidemia, COPD, esophageal reflux, and dementia presents to the emergency department for evaluation of nausea and vomiting. Patient coming from [facility]. Symptoms have been persistent over the past 3 days. Patient states that her nausea and vomiting is secondary to a frontal headache which has been constant. She states that she has a history of similar headaches for many years. She usually takes Tylenol for symptoms, but this did not provide her much relief. Headache is aching and also associated with photophobia. She has no complaints of abdominal pain. Patient denies recent fevers. She is on Plavix daily. No history of recent trauma or fall, per patient.  Patient is a 75 y.o. female presenting with vomiting and headaches. The history is provided by the patient. No language interpreter was used.  Emesis Associated symptoms: headaches   Headache Associated symptoms: nausea and vomiting     Past Medical History  Diagnosis Date  . CAD (coronary artery disease)     a. s/p inferior STEMI 12/29/10 with rotablator atherectomy RCA 12/31/10 and DES x 2 RCA;  b. Lexiscan Myoview (02/2015): mild reversible apical anterior perfusion defect. C. cath 02/2015 50% LAD lesion with patent stent, Tx Rx    . Ischemic cardiomyopathy     EF 45%cath 2012 EF55% 5/12; 35% 11/14  . Diabetes mellitus     type 2  . HTN (hypertension)   . Hyperlipidemia   . Diverticulosis   . Respiratory arrest//ACE Inhibitor presumed cause   . COPD (chronic obstructive  pulmonary disease) ([place])   . Small bowel obstruction (HCC)     from a sigmoid stricture  . Nephrolithiasis   . Sleep apnea   . Overweight(278.02)   . Osteoarthritis   . GERD (gastroesophageal reflux disease)   . Anxiety   . Glaucoma   . Memory deficit   . Dementia    Past Surgical History  Procedure Laterality Date  . Bilateral tubal ligation    . Lithotripsy    . 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 Cardiac surgery    . Cardiac catheterization N/A 03/18/2015    Procedure: LEFT HEART CATH AND CORONARY ANGIOGRAPHY;  Surgeon: Lorretta Harp, MD;  Location: [facility] CATH LAB;  Service: Cardiovascular;  Laterality: N/A;   Family History  Problem Relation Age of Onset  . Breast cancer Maternal Grandmother   . Diabetes Father   . Heart disease Father   . Diabetes Sister   . Colon cancer Neg Hx   . Heart attack Father   . Hypertension Sister   . Stroke Mother    Social History  Substance Use Topics  . Smoking status: Current Every Day Smoker -- 1.00 packs/day for 25 years    Types: Cigarettes  . Smokeless tobacco: Never Used     Comment: over a ppd for 40+ years; quit 10/2004.  unsuccessfully tried eCigs.  . Alcohol Use: No   OB History    No data available      Review of Systems  Unable to perform ROS: Dementia  Gastrointestinal: Positive for nausea and  vomiting.  Neurological: Positive for headaches.    Allergies  Lisinopril; Spironolactone; Donepezil; Eggs or egg-derived products; Erythromycin; Macrolides and ketolides; Penicillins; Quinapril hcl; Angiotensin receptor blockers; Lipitor; Pentazocine lactate; Propoxyphene hcl; Sulfonamide derivatives; and Vancomycin  Home Medications   Prior to Admission medications   Medication Sig Start Date End Date Taking? Authorizing Provider  albuterol (ACCUNEB) 0.63 MG/3ML nebulizer solution Take 1 ampule by nebulization daily.   Yes Historical Provider, MD  aspirin EC 81 MG tablet Take 81 mg  by mouth daily.   Yes Historical Provider, MD  atorvastatin (LIPITOR) 40 MG tablet Take 40 mg by mouth daily.   Yes Historical Provider, MD  clopidogrel (PLAVIX) 75 MG tablet TAKE 1 TABLET BY MOUTH EVERY DAY 11/11/14  Yes Burnell Blanks, MD  furosemide (LASIX) 20 MG tablet Take 20 mg by mouth daily. EDEMA   Yes Historical Provider, MD  insulin glargine (LANTUS) 100 UNIT/ML injection Inject 0.1 mLs (10 Units total) into the skin daily. Patient taking differently: Inject 14 Units into the skin at bedtime.  01/25/14  Yes Hosie Poisson, MD  insulin lispro (HUMALOG) 100 UNIT/ML injection Inject 4 Units into the skin 3 (three) times daily with meals.    Yes Historical Provider, MD  ipratropium (ATROVENT) 0.02 % nebulizer solution Take 2.5 mLs (0.5 mg total) by nebulization 4 (four) times daily. DX CODE J44.9 Patient taking differently: Take 0.5 mg by nebulization daily.  07/22/15  Yes Brand Males, MD  isosorbide mononitrate (IMDUR) 30 MG 24 hr tablet Take 0.5 tablets (15 mg total) by mouth daily. 10/25/15  Yes Isaiah Serge, NP  memantine (NAMENDA XR) 28 MG CP24 24 hr capsule Take 1 capsule (28 mg total) by mouth daily. 01/04/16  Yes Shanker Kristeen Mans, MD  metFORMIN (GLUCOPHAGE) 1000 MG tablet Take 1,000 mg by mouth 2 (two) times daily.   Yes Historical Provider, MD  metoprolol succinate (TOPROL XL) 25 MG 24 hr tablet Take 0.5 tablets (12.5 mg total) by mouth daily. 02/05/16  Yes Donne Hazel, MD  mirtazapine (REMERON SOL-TAB) 15 MG disintegrating tablet Take 1 tablet (15 mg total) by mouth at bedtime. 01/04/16  Yes Shanker Kristeen Mans, MD  mometasone-formoterol (DULERA) 100-5 MCG/ACT AERO Inhale 2 puffs into the lungs 2 (two) times daily.   Yes Historical Provider, MD  omeprazole (PRILOSEC) 20 MG capsule Take 20 mg by mouth daily.    Yes Historical Provider, MD  albuterol (PROVENTIL HFA;VENTOLIN HFA) 108 (90 BASE) MCG/ACT inhaler Inhale 1-2 puffs into the lungs every 6 (six) hours as needed for  wheezing.     Historical Provider, MD  albuterol (PROVENTIL) (2.5 MG/3ML) 0.083% nebulizer solution Take 3 mLs (2.5 mg total) by nebulization 4 (four) times daily. Patient not taking: Reported on 04/20/2016 07/22/15   Brand Males, MD  LORazepam (ATIVAN) 0.5 MG tablet Take 1 tablet (0.5 mg total) by mouth 2 (two) times daily as needed for anxiety. 01/04/16   Shanker Kristeen Mans, MD  nitroGLYCERIN (NITROSTAT) 0.4 MG SL tablet Place 1 tablet (0.4 mg total) under the tongue every 5 (five) minutes as needed. For chest pain. 06/18/13   Burnell Blanks, MD  oxyCODONE-acetaminophen (PERCOCET/ROXICET) 5-325 MG tablet Take 1 tablet by mouth every 6 (six) hours as needed for moderate pain or severe pain. 01/04/16   Shanker Kristeen Mans, MD   BP 150/78 mmHg  Pulse 85  Temp(Src) 98.3 F (36.8 C) (Oral)  Resp 16  SpO2 97%   Physical Exam  Constitutional: She is oriented  to person, place, and time. She appears well-developed and well-nourished. No distress.  HENT:  Head: Normocephalic and atraumatic.  Mouth/Throat: Oropharynx is clear and moist. No oropharyngeal exudate.  Symmetric rise of the uvula with phonation.  Eyes: Conjunctivae and EOM are normal. Pupils are equal, round, and reactive to light. No scleral icterus.  EOMs normal. No nystagmus noted.  Neck: Normal range of motion.  Cardiovascular: Normal rate, regular rhythm and intact distal pulses.   Pulmonary/Chest: Effort normal. No respiratory distress.  Musculoskeletal: Normal range of motion.  Neurological: She is alert and oriented to person, place, and time. No cranial nerve deficit. She exhibits normal muscle tone. Coordination normal.  GCS 15. Speech is goal oriented. No cranial nerve deficits appreciated; symmetric eyebrow raise, no facial drooping, tongue midline. Patient has equal grip strength bilaterally with 5/5 strength against resistance in all major muscle groups bilaterally. Sensation to light touch intact. No pronator drift  noted. Patient moves extremities without ataxia.  Skin: Skin is warm and dry. No rash noted. She is not diaphoretic. No erythema. No pallor.  Psychiatric: She has a normal mood and affect. Her behavior is normal.  Nursing note and vitals reviewed.   ED Course  Procedures (including critical care time) Labs Review Labs Reviewed  CBC WITH DIFFERENTIAL/PLATELET - Abnormal; Notable for the following:    WBC 10.8 (*)    All other components within normal limits  URINALYSIS, ROUTINE W REFLEX MICROSCOPIC (NOT AT [facility]) - Abnormal; Notable for the following:    APPearance CLOUDY (*)    Nitrite POSITIVE (*)    Leukocytes, UA MODERATE (*)    All other components within normal limits  BASIC METABOLIC PANEL - Abnormal; Notable for the following:    Glucose, Bld 173 (*)    Calcium 8.8 (*)    All other components within normal limits  URINE MICROSCOPIC-ADD ON - Abnormal; Notable for the following:    Squamous Epithelial / LPF 6-30 (*)    Bacteria, UA FEW (*)    All other components within normal limits  URINE CULTURE    Imaging Review Ct Head Wo Contrast  04/20/2016  CLINICAL DATA:  Acute onset of nausea and vomiting. Initial encounter. EXAM: CT HEAD WITHOUT CONTRAST TECHNIQUE: Contiguous axial images were obtained from the base of the skull through the vertex without intravenous contrast. COMPARISON:  CT of the head performed 01/02/2016 FINDINGS: There is no evidence of acute infarction, mass lesion, or intra- or extra-axial hemorrhage on CT. Prominence of the ventricles suggests moderate cortical volume loss. Mild cerebellar atrophy is noted. Scattered periventricular and subcortical white matter change likely reflects small vessel ischemic microangiopathy. Chronic lacunar infarcts are noted at the right basal ganglia and right thalamus. The brainstem and fourth ventricle are within normal limits. The cerebral hemispheres demonstrate grossly normal gray-white differentiation. No mass effect or  midline shift is seen. There is no evidence of fracture; visualized osseous structures are unremarkable in appearance. The orbits are within normal limits. The paranasal sinuses and mastoid air cells are well-aerated. No significant soft tissue abnormalities are seen. IMPRESSION: 1. No acute intracranial pathology seen on CT. 2. Moderate cortical volume loss and scattered small vessel ischemic microangiopathy. 3. 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 Electronically Signed   By: Garald Balding M.D.   On: 04/20/2016 23:03     I have personally reviewed and evaluated these images and lab results as part of my medical decision-making.   EKG Interpretation None  11:25 PM Patient reassessed. She was found to be sleeping. When awoken, she states that her headache has greatly improved. She has no complaints of nausea. Rocephin given for UTI coverage.  MDM   Final diagnoses:  UTI (lower urinary tract infection)  Acute nonintractable headache, unspecified headache type    75 year old female presents to the emergency department for complaints of headache. Symptoms associated with nausea and vomiting. Patient is afebrile. She has no nuchal rigidity or meningismus to suggest meningitis. Neurologic exam nonfocal and CT negative for acute stroke. Symptoms have resolved with Reglan and Benadryl. Patient also found to have a urinary tract infection, possibly inciting headache symptoms. She has been treated with IV Rocephin. Will discharge on Keflex. Primary care advised in return precautions given. Patient discharged in satisfactory condition with no unaddressed concerns.   Filed Vitals:   04/20/16 1951  BP: 150/78  Pulse: 85  Temp: 98.3 F (36.8 C)  TempSrc: Oral  Resp: 16  SpO2: 97%     Antonietta Breach, PA-C 04/21/16 0021

## 2016-04-20 NOTE — ED Notes (Signed)
Pt has been sent from [facility] for evaluation nausea and vomiting that has been ongoing for last 3 days.

## 2016-04-20 NOTE — ED Notes (Signed)
I attempted twice to collect labs on the patient and was unsuccessful. I made the nurse aware.

## 2016-04-20 NOTE — ED Notes (Signed)
Bed: WA02 Expected date:  Expected time:  Means of arrival:  Comments: EMS 75 yo female nausea and vomiting

## 2016-04-20 NOTE — Discharge Instructions (Signed)

## 2016-04-20 NOTE — ED Provider Notes (Signed)
Medical screening examination/treatment/procedure(s) were conducted as a shared visit with non-physician practitioner(s) and myself.  I personally evaluated the patient during the encounter.   EKG Interpretation None     Patient here with headache and nausea vomiting. Patient's urinalysis positive for infection. Given IV antibiotics here and will be placed on Keflex and discharged home  Lacretia Leigh, MD 04/20/16 2336

## 2016-04-21 ENCOUNTER — Telehealth: Payer: Self-pay | Admitting: *Deleted

## 2016-04-21 NOTE — Telephone Encounter (Signed)
Pharmacy called related to Rx: cephALEXin (KEFLEX) 500 MG capsule because pt has PCN listed as allergy.

## 2016-04-22 LAB — URINE CULTURE

## 2016-04-27 ENCOUNTER — Emergency Department (HOSPITAL_COMMUNITY)
Admission: EM | Admit: 2016-04-27 | Discharge: 2016-04-28 | Disposition: A | Discharge status: Home / Self Care | Payer: Medicare Other | Attending: Emergency Medicine | Admitting: Emergency Medicine

## 2016-04-27 ENCOUNTER — Emergency Department (HOSPITAL_COMMUNITY): Payer: Medicare Other

## 2016-04-27 ENCOUNTER — Encounter (HOSPITAL_COMMUNITY): Payer: Self-pay | Admitting: Emergency Medicine

## 2016-04-27 DIAGNOSIS — Z794 Long term (current) use of insulin: Secondary | ICD-10-CM | POA: Diagnosis not present

## 2016-04-27 DIAGNOSIS — R1011 Right upper quadrant pain: Secondary | ICD-10-CM | POA: Diagnosis not present

## 2016-04-27 DIAGNOSIS — F17210 Nicotine dependence, cigarettes, uncomplicated: Secondary | ICD-10-CM | POA: Diagnosis not present

## 2016-04-27 DIAGNOSIS — E119 Type 2 diabetes mellitus without complications: Secondary | ICD-10-CM | POA: Insufficient documentation

## 2016-04-27 DIAGNOSIS — J449 Chronic obstructive pulmonary disease, unspecified: Secondary | ICD-10-CM | POA: Diagnosis not present

## 2016-04-27 DIAGNOSIS — Z7984 Long term (current) use of oral hypoglycemic drugs: Secondary | ICD-10-CM | POA: Insufficient documentation

## 2016-04-27 DIAGNOSIS — R1031 Right lower quadrant pain: Secondary | ICD-10-CM | POA: Insufficient documentation

## 2016-04-27 DIAGNOSIS — I10 Essential (primary) hypertension: Secondary | ICD-10-CM | POA: Diagnosis not present

## 2016-04-27 DIAGNOSIS — Z7982 Long term (current) use of aspirin: Secondary | ICD-10-CM | POA: Insufficient documentation

## 2016-04-27 DIAGNOSIS — I2510 Atherosclerotic heart disease of native coronary artery without angina pectoris: Secondary | ICD-10-CM | POA: Insufficient documentation

## 2016-04-27 DIAGNOSIS — R109 Unspecified abdominal pain: Secondary | ICD-10-CM

## 2016-04-27 LAB — URINALYSIS, ROUTINE W REFLEX MICROSCOPIC
BILIRUBIN URINE: NEGATIVE
Glucose, UA: NEGATIVE mg/dL
Hgb urine dipstick: NEGATIVE
Ketones, ur: NEGATIVE mg/dL
NITRITE: NEGATIVE
Protein, ur: NEGATIVE mg/dL
SPECIFIC GRAVITY, URINE: 1.019 (ref 1.005–1.030)
pH: 6 (ref 5.0–8.0)

## 2016-04-27 LAB — COMPREHENSIVE METABOLIC PANEL
ALK PHOS: 69 U/L (ref 38–126)
ALT: 9 U/L — ABNORMAL LOW (ref 14–54)
ANION GAP: 8 (ref 5–15)
AST: 22 U/L (ref 15–41)
Albumin: 4.4 g/dL (ref 3.5–5.0)
BILIRUBIN TOTAL: 1.2 mg/dL (ref 0.3–1.2)
BUN: 17 mg/dL (ref 6–20)
CALCIUM: 9.2 mg/dL (ref 8.9–10.3)
CO2: 32 mmol/L (ref 22–32)
Chloride: 102 mmol/L (ref 101–111)
Creatinine, Ser: 0.87 mg/dL (ref 0.44–1.00)
GLUCOSE: 196 mg/dL — AB (ref 65–99)
Potassium: 4.4 mmol/L (ref 3.5–5.1)
Sodium: 142 mmol/L (ref 135–145)
TOTAL PROTEIN: 7.7 g/dL (ref 6.5–8.1)

## 2016-04-27 LAB — CBC
HCT: 41.9 % (ref 36.0–46.0)
Hemoglobin: 14.8 g/dL (ref 12.0–15.0)
MCH: 33 pg (ref 26.0–34.0)
MCHC: 35.3 g/dL (ref 30.0–36.0)
MCV: 93.3 fL (ref 78.0–100.0)
Platelets: 187 K/uL (ref 150–400)
RBC: 4.49 MIL/uL (ref 3.87–5.11)
RDW: 12.8 % (ref 11.5–15.5)
WBC: 10.3 K/uL (ref 4.0–10.5)

## 2016-04-27 LAB — URINE MICROSCOPIC-ADD ON

## 2016-04-27 LAB — LIPASE, BLOOD: Lipase: 29 U/L (ref 11–51)

## 2016-04-27 MED ORDER — SODIUM CHLORIDE 0.9 % IV BOLUS (SEPSIS)
500.0000 mL | Freq: Once | INTRAVENOUS | Status: AC
  Administered 2016-04-27: 500 mL via INTRAVENOUS

## 2016-04-27 MED ORDER — MORPHINE SULFATE (PF) 2 MG/ML IV SOLN
2.0000 mg | Freq: Once | INTRAVENOUS | Status: AC
  Administered 2016-04-27: 2 mg via INTRAVENOUS
  Filled 2016-04-27: qty 1

## 2016-04-27 MED ORDER — ONDANSETRON 4 MG PO TBDP
4.0000 mg | ORAL_TABLET | Freq: Once | ORAL | Status: AC | PRN
  Administered 2016-04-27: 4 mg via ORAL
  Filled 2016-04-27: qty 1

## 2016-04-27 MED ORDER — DIATRIZOATE MEGLUMINE & SODIUM 66-10 % PO SOLN: 15.0000 mL | ORAL | Status: DC | PRN

## 2016-04-27 MED ORDER — IOPAMIDOL (ISOVUE-300) INJECTION 61%
100.0000 mL | Freq: Once | INTRAVENOUS | Status: AC | PRN
  Administered 2016-04-27: 100 mL via INTRAVENOUS

## 2016-04-27 NOTE — ED Notes (Signed)
Bed: WA20 Expected date:  Expected time:  Means of arrival:  Comments: EMS 

## 2016-04-27 NOTE — ED Notes (Signed)
[facility]/911 [employer] called for pickup to [facility]

## 2016-04-27 NOTE — ED Notes (Signed)
Per EMS pt comes from [facility] for intermittent abd pain that started today around noon.  Denies n/v/d.  Last BM was yesterday. Patient has some dementia.

## 2016-04-27 NOTE — ED Provider Notes (Signed)
CSN: [id number]     Arrival date & time 04/27/16  1701 History   First MD Initiated Contact with Patient 04/27/16 1735     Chief Complaint  Patient presents with  . Abdominal Pain     (Consider location/radiation/quality/duration/timing/severity/associated sxs/prior Treatment) HPI  75 year old female resents from her nursing home with abdominal pain. History is limited as the patient has dementia. However she tells me that starting today she has had right-sided abdominal pain. Points to her right lower quadrant. The nursing facility states that she's been having intermittent abdominal pain since around noon. No vomiting or diarrhea. Patient recently seen in the ED on 6/1 and diagnosed with a UTI. Patient denies any other symptoms including no vomiting or diarrhea. No headaches or chest pain. No back pain.  Past Medical History  Diagnosis Date  . CAD (coronary artery disease)     a. s/p inferior STEMI 12/29/10 with rotablator atherectomy RCA 12/31/10 and DES x 2 RCA;  b. Lexiscan Myoview (02/2015): mild reversible apical anterior perfusion defect. C. cath 02/2015 50% LAD lesion with patent stent, Tx Rx    . Ischemic cardiomyopathy     EF 45%cath 2012 EF55% 5/12; 35% 11/14  . Diabetes mellitus     type 2  . HTN (hypertension)   . Hyperlipidemia   . Diverticulosis   . Respiratory arrest//ACE Inhibitor presumed cause   . COPD (chronic obstructive pulmonary disease) ([place])   . Small bowel obstruction (HCC)     from a sigmoid stricture  . Nephrolithiasis   . Sleep apnea   . Overweight(278.02)   . Osteoarthritis   . GERD (gastroesophageal reflux disease)   . Anxiety   . Glaucoma   . Memory deficit   . Dementia    Past Surgical History  Procedure Laterality Date  . Bilateral tubal ligation    . Lithotripsy    . 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 Cardiac surgery    . Cardiac catheterization N/A 03/18/2015    Procedure: LEFT HEART CATH AND CORONARY  ANGIOGRAPHY;  Surgeon: Lorretta Harp, MD;  Location: [facility] CATH LAB;  Service: Cardiovascular;  Laterality: N/A;   Family History  Problem Relation Age of Onset  . Breast cancer Maternal Grandmother   . Diabetes Father   . Heart disease Father   . Diabetes Sister   . Colon cancer Neg Hx   . Heart attack Father   . Hypertension Sister   . Stroke Mother    Social History  Substance Use Topics  . Smoking status: Current Every Day Smoker -- 1.00 packs/day for 25 years    Types: Cigarettes  . Smokeless tobacco: Never Used     Comment: over a ppd for 40+ years; quit 10/2004.  unsuccessfully tried eCigs.  . Alcohol Use: No   OB History    No data available     Review of Systems  Unable to perform ROS: Dementia      Allergies  Lisinopril; Spironolactone; Donepezil; Eggs or egg-derived products; Erythromycin; Macrolides and ketolides; Penicillins; Quinapril hcl; Angiotensin receptor blockers; Lipitor; Pentazocine lactate; Propoxyphene hcl; Sulfonamide derivatives; and Vancomycin  Home Medications   Prior to Admission medications   Medication Sig Start Date End Date Taking? Authorizing Provider  albuterol (ACCUNEB) 0.63 MG/3ML nebulizer solution Take 1 ampule by nebulization daily.    Historical Provider, MD  albuterol (PROVENTIL HFA;VENTOLIN HFA) 108 (90 BASE) MCG/ACT inhaler Inhale 1-2 puffs into the lungs every 6 (six) hours as  needed for wheezing.     Historical Provider, MD  albuterol (PROVENTIL) (2.5 MG/3ML) 0.083% nebulizer solution Take 3 mLs (2.5 mg total) by nebulization 4 (four) times daily. Patient not taking: Reported on 04/20/2016 07/22/15   Brand Males, MD  aspirin EC 81 MG tablet Take 81 mg by mouth daily.    Historical Provider, MD  atorvastatin (LIPITOR) 40 MG tablet Take 40 mg by mouth daily.    Historical Provider, MD  cephALEXin (KEFLEX) 500 MG capsule Take 1 capsule (500 mg total) by mouth 2 (two) times daily. 04/20/16   Antonietta Breach, PA-C  clopidogrel (PLAVIX)  75 MG tablet TAKE 1 TABLET BY MOUTH EVERY DAY 11/11/14   Burnell Blanks, MD  furosemide (LASIX) 20 MG tablet Take 20 mg by mouth daily. EDEMA    Historical Provider, MD  insulin glargine (LANTUS) 100 UNIT/ML injection Inject 0.1 mLs (10 Units total) into the skin daily. Patient taking differently: Inject 14 Units into the skin at bedtime.  01/25/14   Hosie Poisson, MD  insulin lispro (HUMALOG) 100 UNIT/ML injection Inject 4 Units into the skin 3 (three) times daily with meals.     Historical Provider, MD  ipratropium (ATROVENT) 0.02 % nebulizer solution Take 2.5 mLs (0.5 mg total) by nebulization 4 (four) times daily. DX CODE J44.9 Patient taking differently: Take 0.5 mg by nebulization daily.  07/22/15   Brand Males, MD  isosorbide mononitrate (IMDUR) 30 MG 24 hr tablet Take 0.5 tablets (15 mg total) by mouth daily. 10/25/15   Isaiah Serge, NP  LORazepam (ATIVAN) 0.5 MG tablet Take 1 tablet (0.5 mg total) by mouth 2 (two) times daily as needed for anxiety. 01/04/16   Shanker Kristeen Mans, MD  memantine (NAMENDA XR) 28 MG CP24 24 hr capsule Take 1 capsule (28 mg total) by mouth daily. 01/04/16   Shanker Kristeen Mans, MD  metFORMIN (GLUCOPHAGE) 1000 MG tablet Take 1,000 mg by mouth 2 (two) times daily.    Historical Provider, MD  metoprolol succinate (TOPROL XL) 25 MG 24 hr tablet Take 0.5 tablets (12.5 mg total) by mouth daily. 02/05/16   Donne Hazel, MD  mirtazapine (REMERON SOL-TAB) 15 MG disintegrating tablet Take 1 tablet (15 mg total) by mouth at bedtime. 01/04/16   Shanker Kristeen Mans, MD  mometasone-formoterol (DULERA) 100-5 MCG/ACT AERO Inhale 2 puffs into the lungs 2 (two) times daily.    Historical Provider, MD  nitroGLYCERIN (NITROSTAT) 0.4 MG SL tablet Place 1 tablet (0.4 mg total) under the tongue every 5 (five) minutes as needed. For chest pain. 06/18/13   Burnell Blanks, MD  omeprazole (PRILOSEC) 20 MG capsule Take 20 mg by mouth daily.     Historical Provider, MD   oxyCODONE-acetaminophen (PERCOCET/ROXICET) 5-325 MG tablet Take 1 tablet by mouth every 6 (six) hours as needed for moderate pain or severe pain. 01/04/16   Shanker Kristeen Mans, MD   BP 147/71 mmHg  Pulse 95  Temp(Src) 97.9 F (36.6 C) (Oral)  Resp 18  SpO2 97% Physical Exam  Constitutional: She appears well-developed and well-nourished.  HENT:  Head: Normocephalic and atraumatic.  Right Ear: External ear normal.  Left Ear: External ear normal.  Nose: Nose normal.  Eyes: Right eye exhibits no discharge. Left eye exhibits no discharge.  Cardiovascular: Normal rate, regular rhythm and normal heart sounds.   Pulmonary/Chest: Effort normal and breath sounds normal.  Abdominal: Soft. There is tenderness in the right upper quadrant and right lower quadrant. There is CVA tenderness (  right sided).  Neurological: She is alert. She is disoriented.  Skin: Skin is warm and dry.  Nursing note and vitals reviewed.   ED Course  Procedures (including critical care time) Labs Review Labs Reviewed  COMPREHENSIVE METABOLIC PANEL - Abnormal; Notable for the following:    Glucose, Bld 196 (*)    ALT 9 (*)    All other components within normal limits  URINALYSIS, ROUTINE W REFLEX MICROSCOPIC (NOT AT [facility]) - Abnormal; Notable for the following:    Leukocytes, UA TRACE (*)    All other components within normal limits  URINE MICROSCOPIC-ADD ON - Abnormal; Notable for the following:    Squamous Epithelial / LPF 6-30 (*)    Bacteria, UA RARE (*)    All other components within normal limits  LIPASE, BLOOD  CBC    Imaging Review Ct Abdomen Pelvis W Contrast  04/27/2016  CLINICAL DATA:  Intermittent abdominal pain for several hours, initial encounter EXAM: CT ABDOMEN AND PELVIS WITH CONTRAST TECHNIQUE: Multidetector CT imaging of the abdomen and pelvis was performed using the standard protocol following bolus administration of intravenous contrast. CONTRAST:  135mL ISOVUE-300 IOPAMIDOL (ISOVUE-300)  INJECTION 61% COMPARISON:  02/28/2016, 05/06/2015 FINDINGS: Lung bases are free of acute infiltrate or sizable effusion. The liver, gallbladder, spleen, adrenal glands and pancreas are within normal limits. Kidneys are well visualized and demonstrate a nonobstructing left renal stone in mid portion and better visualized than on the prior exam. Hypodensity is again identified within the right mid kidney stable from the prior exam measuring less than 1 cm. Again and likely represents a complicated cyst. Aortoiliac calcifications are noted without aneurysmal dilatation. The appendix is not well visualized. No inflammatory changes are identified to suggest appendicitis. The bladder is decompressed. The uterus and ovaries appear within normal limits for the patient's age. Prior sigmoid surgery is noted. Mild compression deformities noted at L5 stable from the previous exam. IMPRESSION: Stable hypodensity in the right kidney. Better visualized on the current exam is a small 5 mm nonobstructing left renal stone. The remainder the exam is stable from the prior study. Electronically Signed   By: Inez Catalina M.D.   On: 04/27/2016 19:58   US Abdomen Limited Ruq  04/27/2016  CLINICAL DATA:  Right upper quadrant abdominal pain EXAM: US ABDOMEN LIMITED - RIGHT UPPER QUADRANT COMPARISON:  CT from the same day FINDINGS: Gallbladder: Partially distended gallbladder. No calculi or tenderness. Much of the measured gallbladder wall on this study is fat. Gallbladder wall thickening at the fundus shows cystic spaces on comparison CT, likely focal adenomyomatosis. Common bile duct: Diameter: 3 mm.  Where visualized, no filling defect. Liver: No focal lesion identified. Within normal limits in parenchymal echogenicity. IMPRESSION: 1. No acute finding or gallbladder calculus. 2. Probable focal adenomyomatosis at the gallbladder fundus. Electronically Signed   By: Monte Fantasia M.D.   On: 04/27/2016 21:38   I have personally reviewed  and evaluated these images and lab results as part of my medical decision-making.   EKG Interpretation None      MDM   Final diagnoses:  Right sided abdominal pain    No obvious etiology for the patient's right-sided abdominal pain. CT and ultrasound are unremarkable. Blood work is unremarkable. Urinalysis is not consistent with UTI. She has already been treated for a UTI during this week. I doubt this represents pyelonephritis given benign vital signs, normal white blood cell count, no fever, and no vomiting. Patient feels better at this time. At this  point will discharge back to her facility to follow-up with her PCP.    Sherwood Gambler, MD 04/28/16 [phone]

## 2016-06-28 DIAGNOSIS — J449 Chronic obstructive pulmonary disease, unspecified: Secondary | ICD-10-CM | POA: Diagnosis not present

## 2016-06-28 DIAGNOSIS — M1990 Unspecified osteoarthritis, unspecified site: Secondary | ICD-10-CM | POA: Diagnosis not present

## 2016-06-28 DIAGNOSIS — R609 Edema, unspecified: Secondary | ICD-10-CM | POA: Diagnosis not present

## 2016-06-28 DIAGNOSIS — M6281 Muscle weakness (generalized): Secondary | ICD-10-CM | POA: Diagnosis not present

## 2016-07-03 ENCOUNTER — Emergency Department (HOSPITAL_COMMUNITY)
Admission: EM | Admit: 2016-07-03 | Discharge: 2016-07-03 | Disposition: A | Discharge status: Home / Self Care | Payer: Medicare Other | Attending: Emergency Medicine | Admitting: Emergency Medicine

## 2016-07-03 ENCOUNTER — Emergency Department (HOSPITAL_COMMUNITY): Payer: Medicare Other

## 2016-07-03 ENCOUNTER — Encounter (HOSPITAL_COMMUNITY): Payer: Self-pay | Admitting: Emergency Medicine

## 2016-07-03 DIAGNOSIS — J441 Chronic obstructive pulmonary disease with (acute) exacerbation: Secondary | ICD-10-CM | POA: Diagnosis not present

## 2016-07-03 DIAGNOSIS — Z7984 Long term (current) use of oral hypoglycemic drugs: Secondary | ICD-10-CM | POA: Insufficient documentation

## 2016-07-03 DIAGNOSIS — J80 Acute respiratory distress syndrome: Secondary | ICD-10-CM | POA: Diagnosis not present

## 2016-07-03 DIAGNOSIS — Z79899 Other long term (current) drug therapy: Secondary | ICD-10-CM | POA: Diagnosis not present

## 2016-07-03 DIAGNOSIS — Z7982 Long term (current) use of aspirin: Secondary | ICD-10-CM | POA: Insufficient documentation

## 2016-07-03 DIAGNOSIS — R069 Unspecified abnormalities of breathing: Secondary | ICD-10-CM | POA: Diagnosis not present

## 2016-07-03 DIAGNOSIS — I5022 Chronic systolic (congestive) heart failure: Secondary | ICD-10-CM | POA: Diagnosis not present

## 2016-07-03 DIAGNOSIS — E119 Type 2 diabetes mellitus without complications: Secondary | ICD-10-CM | POA: Insufficient documentation

## 2016-07-03 DIAGNOSIS — I2510 Atherosclerotic heart disease of native coronary artery without angina pectoris: Secondary | ICD-10-CM | POA: Insufficient documentation

## 2016-07-03 DIAGNOSIS — R0602 Shortness of breath: Secondary | ICD-10-CM | POA: Diagnosis not present

## 2016-07-03 DIAGNOSIS — I110 Hypertensive heart disease with heart failure: Secondary | ICD-10-CM | POA: Insufficient documentation

## 2016-07-03 DIAGNOSIS — Z794 Long term (current) use of insulin: Secondary | ICD-10-CM | POA: Diagnosis not present

## 2016-07-03 DIAGNOSIS — F17210 Nicotine dependence, cigarettes, uncomplicated: Secondary | ICD-10-CM | POA: Insufficient documentation

## 2016-07-03 LAB — CBC WITH DIFFERENTIAL/PLATELET
BASOS ABS: 0 10*3/uL (ref 0.0–0.1)
Basophils Relative: 0 %
EOS ABS: 0 10*3/uL (ref 0.0–0.7)
EOS PCT: 0 %
HCT: 39.1 % (ref 36.0–46.0)
HEMOGLOBIN: 13.4 g/dL (ref 12.0–15.0)
LYMPHS ABS: 1.2 10*3/uL (ref 0.7–4.0)
LYMPHS PCT: 11 %
MCH: 33.3 pg (ref 26.0–34.0)
MCHC: 34.3 g/dL (ref 30.0–36.0)
MCV: 97.3 fL (ref 78.0–100.0)
Monocytes Absolute: 0.1 10*3/uL (ref 0.1–1.0)
Monocytes Relative: 1 %
NEUTROS PCT: 88 %
Neutro Abs: 9 10*3/uL — ABNORMAL HIGH (ref 1.7–7.7)
PLATELETS: 162 10*3/uL (ref 150–400)
RBC: 4.02 MIL/uL (ref 3.87–5.11)
RDW: 12.9 % (ref 11.5–15.5)
WBC: 10.4 10*3/uL (ref 4.0–10.5)

## 2016-07-03 LAB — BASIC METABOLIC PANEL
ANION GAP: 10 (ref 5–15)
BUN: 17 mg/dL (ref 6–20)
CHLORIDE: 102 mmol/L (ref 101–111)
CO2: 28 mmol/L (ref 22–32)
Calcium: 8.8 mg/dL — ABNORMAL LOW (ref 8.9–10.3)
Creatinine, Ser: 0.81 mg/dL (ref 0.44–1.00)
GFR calc Af Amer: >60 mL/min (ref >60)
Glucose, Bld: 236 mg/dL — ABNORMAL HIGH (ref 65–99)
POTASSIUM: 3.5 mmol/L (ref 3.5–5.1)
SODIUM: 140 mmol/L (ref 135–145)

## 2016-07-03 LAB — I-STAT TROPONIN, ED: TROPONIN I, POC: 0.01 ng/mL (ref 0.00–0.08)

## 2016-07-03 MED ORDER — PREDNISONE 20 MG PO TABS: 20.0000 mg | ORAL_TABLET | Freq: Every day | ORAL | 0 refills | Status: DC

## 2016-07-03 MED ORDER — ALBUTEROL SULFATE (2.5 MG/3ML) 0.083% IN NEBU
2.5000 mg | INHALATION_SOLUTION | RESPIRATORY_TRACT | Status: DC | PRN
  Administered 2016-07-03: 2.5 mg via RESPIRATORY_TRACT
  Filled 2016-07-03: qty 3

## 2016-07-03 NOTE — ED Triage Notes (Signed)
Patient presents from [facility] via EMS for COPD/SOB exacerbation. Wheezing in upper left lobe. Neb tx in route. 7.5 Albuterol, .5 Atrovent, 125 Solumedrol in route.   20g left hand.   Last VS: 138/84, 94%ra

## 2016-07-03 NOTE — ED Notes (Signed)
Bed: WA03 Expected date:  Expected time:  Means of arrival:  Comments: 

## 2016-07-03 NOTE — ED Notes (Signed)
Patient given sandwich, drink and crackers.  [facility] called for transport back to facility.

## 2016-07-03 NOTE — ED Notes (Signed)
MD at bedside. 

## 2016-07-04 DIAGNOSIS — F339 Major depressive disorder, recurrent, unspecified: Secondary | ICD-10-CM | POA: Diagnosis not present

## 2016-07-04 DIAGNOSIS — J449 Chronic obstructive pulmonary disease, unspecified: Secondary | ICD-10-CM | POA: Diagnosis not present

## 2016-07-04 DIAGNOSIS — R112 Nausea with vomiting, unspecified: Secondary | ICD-10-CM | POA: Diagnosis not present

## 2016-07-04 DIAGNOSIS — R531 Weakness: Secondary | ICD-10-CM | POA: Diagnosis not present

## 2016-07-04 DIAGNOSIS — R0602 Shortness of breath: Secondary | ICD-10-CM | POA: Diagnosis not present

## 2016-07-04 DIAGNOSIS — R109 Unspecified abdominal pain: Secondary | ICD-10-CM | POA: Diagnosis not present

## 2016-07-04 DIAGNOSIS — K210 Gastro-esophageal reflux disease with esophagitis: Secondary | ICD-10-CM | POA: Diagnosis not present

## 2016-07-04 DIAGNOSIS — I2510 Atherosclerotic heart disease of native coronary artery without angina pectoris: Secondary | ICD-10-CM | POA: Diagnosis not present

## 2016-07-04 DIAGNOSIS — I10 Essential (primary) hypertension: Secondary | ICD-10-CM | POA: Diagnosis not present

## 2016-07-04 NOTE — ED Provider Notes (Signed)
[facility] DEPT Provider Note   CSN: [id number] Arrival date & time: 07/03/16  1702     History   Chief Complaint No chief complaint on file.   HPI [name] is a 75 y.o. female. She presents with chief complaint of shortness of breath. History of COPD. States she felt like her breathing "got out of line" today. In by paramedics given nebulized albuterol in route. States she feels "almost normal again". Denies chest pain. No swelling. Nonproductive dry cough today. Not yesterday. No other new or worsening symptoms.  HPI  Past Medical History:  Diagnosis Date  . Anxiety   . CAD (coronary artery disease)    a. s/p inferior STEMI 12/29/10 with rotablator atherectomy RCA 12/31/10 and DES x 2 RCA;  b. Lexiscan Myoview (02/2015): mild reversible apical anterior perfusion defect. C. cath 02/2015 50% LAD lesion with patent stent, Tx Rx    . COPD (chronic obstructive pulmonary disease) ([place])   . Dementia   . Diabetes mellitus    type 2  . Diverticulosis   . GERD (gastroesophageal reflux disease)   . Glaucoma   . HTN (hypertension)   . Hyperlipidemia   . Ischemic cardiomyopathy    EF 45%cath 2012 EF55% 5/12; 35% 11/14  . Memory deficit   . Nephrolithiasis   . Osteoarthritis   . Overweight(278.02)   . Respiratory arrest//ACE Inhibitor presumed cause   . Sleep apnea   . Small bowel obstruction (HCC)    from a sigmoid stricture    Patient Active Problem List   Diagnosis Date Noted  . Lactic acidosis 01/02/2016  . Encephalopathy acute 01/02/2016  . Acute encephalopathy 01/02/2016  . 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 UTI (urinary tract infection) 10/06/2015  . Obesity (BMI 30-39.9) 10/06/2015  . COLD (chronic obstructive lung disease) ([place]) 07/22/2015  . Tobacco use disorder 03/17/2015  . Cardiomyopathy, ischemic 03/17/2015  . Allergy to ACE inhibitors 03/17/2015  . Intolerance of drug 03/17/2015  . Ventricular tachycardia ([place]) 03/17/2015    . Chest pain 03/16/2015  . HLD (hyperlipidemia) 12/31/2014  . COPD (chronic obstructive pulmonary disease) ([place]) 12/31/2014  . HCAP (healthcare-associated pneumonia) 12/31/2014  . Diabetes mellitus without complication ([place])   . Atelectasis 01/23/2014  . Chronic systolic heart failure ([place]) 10/12/2013  . Acute diastolic CHF (congestive heart failure) ([place]) 10/10/2013  . Hypomagnesemia 10/10/2013  . Chronic cough 09/12/2013  . Acute respiratory failure ([place]) 05/20/2013  . Memory deficit 04/23/2013  . Essential hypertension, benign 04/03/2013  . Dyslipidemia 04/03/2013  . COPD exacerbation ([place]) 04/03/2013  . Coronary atherosclerosis of native coronary artery 04/03/2013  . DM2 (diabetes mellitus, type 2) ([place]) 04/02/2013  . Hyperglycemia 04/02/2013  . Hypokalemia 04/02/2013  . Fibromyalgia 04/20/2008    Past Surgical History:  Procedure Laterality Date  . bilateral tubal ligation    . CARDIAC CATHETERIZATION N/A 03/18/2015   Procedure: LEFT HEART CATH AND CORONARY ANGIOGRAPHY;  Surgeon: Lorretta Harp, MD;  Location: [facility] CATH LAB;  Service: Cardiovascular;  Laterality: N/A;  . CARDIAC SURGERY    . 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 LITHOTRIPSY      OB History    No data available       Home Medications    Prior to Admission medications   Medication Sig Start Date End Date Taking? Authorizing Provider  albuterol (ACCUNEB) 0.63 MG/3ML nebulizer solution Take 1 ampule by nebulization daily.   Yes Historical Provider, MD  albuterol (PROVENTIL  HFA;VENTOLIN HFA) 108 (90 BASE) MCG/ACT inhaler Inhale 2 puffs into the lungs every 6 (six) hours as needed for wheezing or shortness of breath.    Yes Historical Provider, MD  aspirin EC 81 MG tablet Take 81 mg by mouth daily.   Yes Historical Provider, MD  atorvastatin (LIPITOR) 40 MG tablet Take 40 mg by mouth daily.   Yes Historical Provider, MD  clopidogrel (PLAVIX) 75 MG tablet Take 75 mg by  mouth daily.   Yes Historical Provider, MD  furosemide (LASIX) 20 MG tablet Take 20 mg by mouth daily.    Yes Historical Provider, MD  insulin glargine (LANTUS) 100 unit/mL SOPN Inject 14 Units into the skin at bedtime.   Yes Historical Provider, MD  insulin lispro (HUMALOG KWIKPEN) 100 UNIT/ML KiwkPen Inject 4 Units into the skin 3 (three) times daily with meals.   Yes Historical Provider, MD  ipratropium (ATROVENT) 0.02 % nebulizer solution Take 0.5 mg by nebulization daily.   Yes Historical Provider, MD  isosorbide mononitrate (IMDUR) 30 MG 24 hr tablet Take 0.5 tablets (15 mg total) by mouth daily. 10/25/15  Yes Isaiah Serge, NP  LORazepam (ATIVAN) 0.5 MG tablet Take 1 tablet (0.5 mg total) by mouth 2 (two) times daily as needed for anxiety. 01/04/16  Yes Shanker Kristeen Mans, MD  memantine (NAMENDA) 10 MG tablet Take 10 mg by mouth 2 (two) times daily.   Yes Historical Provider, MD  metFORMIN (GLUCOPHAGE) 1000 MG tablet Take 1,000 mg by mouth 2 (two) times daily with a meal.    Yes Historical Provider, MD  metoprolol succinate (TOPROL XL) 25 MG 24 hr tablet Take 0.5 tablets (12.5 mg total) by mouth daily. 02/05/16  Yes Donne Hazel, MD  mirtazapine (REMERON SOL-TAB) 15 MG disintegrating tablet Take 1 tablet (15 mg total) by mouth at bedtime. 01/04/16  Yes Shanker Kristeen Mans, MD  mometasone-formoterol (DULERA) 100-5 MCG/ACT AERO Inhale 2 puffs into the lungs 2 (two) times daily.   Yes Historical Provider, MD  nitroGLYCERIN (NITROSTAT) 0.4 MG SL tablet Place 0.4 mg under the tongue every 5 (five) minutes as needed for chest pain.   Yes Historical Provider, MD  omeprazole (PRILOSEC) 20 MG capsule Take 20 mg by mouth daily before breakfast.    Yes Historical Provider, MD  oxyCODONE-acetaminophen (PERCOCET/ROXICET) 5-325 MG tablet Take 1 tablet by mouth every 6 (six) hours as needed for moderate pain or severe pain. 01/04/16  Yes Shanker Kristeen Mans, MD  cephALEXin (KEFLEX) 500 MG capsule Take 1 capsule (500  mg total) by mouth 2 (two) times daily. Patient not taking: Reported on 07/03/2016 04/20/16   Antonietta Breach, PA-C  predniSONE (DELTASONE) 20 MG tablet Take 1 tablet (20 mg total) by mouth daily with breakfast. 07/03/16   Tanna Furry, MD    Family History Family History  Problem Relation Age of Onset  . Diabetes Father   . Heart disease Father   . Heart attack Father   . Stroke Mother   . Breast cancer Maternal Grandmother   . Diabetes Sister   . Hypertension Sister   . Colon cancer Neg Hx     Social History Social History  Substance Use Topics  . Smoking status: Current Every Day Smoker    Packs/day: 1.00    Years: 25.00    Types: Cigarettes  . Smokeless tobacco: Never Used     Comment: over a ppd for 40+ years; quit 10/2004.  unsuccessfully tried eCigs.  . Alcohol use No  Allergies   Lisinopril; Spironolactone; Vancomycin; Donepezil; Eggs or egg-derived products; Erythromycin; Macrolides and ketolides; Penicillins; Quinapril hcl; Angiotensin receptor blockers; Lipitor [atorvastatin]; Pentazocine lactate; Propoxyphene hcl; and Sulfonamide derivatives   Review of Systems Review of Systems  Constitutional: Negative for appetite change, chills, diaphoresis, fatigue and fever.  HENT: Negative for mouth sores, sore throat and trouble swallowing.   Eyes: Negative for visual disturbance.  Respiratory: Positive for chest tightness and wheezing. Negative for cough and shortness of breath.   Cardiovascular: Negative for chest pain.  Gastrointestinal: Negative for abdominal distention, abdominal pain, diarrhea, nausea and vomiting.  Endocrine: Negative for polydipsia, polyphagia and polyuria.  Genitourinary: Negative for dysuria, frequency and hematuria.  Musculoskeletal: Negative for gait problem.  Skin: Negative for color change, pallor and rash.  Neurological: Negative for dizziness, syncope, light-headedness and headaches.  Hematological: Does not bruise/bleed easily.    Psychiatric/Behavioral: Negative for behavioral problems and confusion.     Physical Exam Updated Vital Signs BP 158/73   Pulse 91   Temp 98.1 F (36.7 C)   Resp 18   Ht 5\' 5"  (1.651 m)   Wt 146 lb (66.2 kg)   SpO2 94%   BMI 24.30 kg/m   Physical Exam  Constitutional: She is oriented to person, place, and time. She appears well-developed and well-nourished. No distress.  HENT:  Head: Normocephalic.  Eyes: Conjunctivae are normal. Pupils are equal, round, and reactive to light. No scleral icterus.  Neck: Normal range of motion. Neck supple. No thyromegaly present.  Cardiovascular: Normal rate and regular rhythm.  Exam reveals no gallop and no friction rub.   No murmur heard. Pulmonary/Chest: Effort normal and breath sounds normal. No respiratory distress. She has no wheezes. She has no rales.  Essentially clear lungs. No prolongation or wheezing. She states it feels "still a little wheezy".  Abdominal: Soft. Bowel sounds are normal. She exhibits no distension. There is no tenderness. There is no rebound.  Musculoskeletal: Normal range of motion.  Neurological: She is alert and oriented to person, place, and time.  Skin: Skin is warm and dry. No rash noted.  Psychiatric: She has a normal mood and affect. Her behavior is normal.     ED Treatments / Results  Labs (all labs ordered are listed, but only abnormal results are displayed) Labs Reviewed  CBC WITH DIFFERENTIAL/PLATELET - Abnormal; Notable for the following:       Result Value   Neutro Abs 9.0 (*)    All other components within normal limits  BASIC METABOLIC PANEL - Abnormal; Notable for the following:    Glucose, Bld 236 (*)    Calcium 8.8 (*)    All other components within normal limits  I-STAT TROPOININ, ED    EKG  EKG Interpretation  Date/Time:  Monday July 03 2016 17:34:25 EDT Ventricular Rate:  88 PR Interval:    QRS Duration: 105 QT Interval:  415 QTC Calculation: 503 R Axis:   63 Text  Interpretation:  Sinus rhythm Low voltage, precordial leads Prolonged QT interval Confirmed by Jeneen Rinks  MD, [employer] ([id number]) on 07/03/2016 5:37:15 PM       Radiology Dg Chest 2 View  Result Date: 07/03/2016 CLINICAL DATA:  Shortness of breath.  Wheezing.  COPD. EXAM: CHEST  2 VIEW COMPARISON:  02/04/2016 and 01/01/2016 FINDINGS: The heart size and mediastinal contours are within normal limits. Aortic atherosclerosis noted. Left basilar pleural thickening remains stable. No evidence of pulmonary infiltrate or pleural effusion. No evidence of pneumothorax. IMPRESSION: Stable left  basilar pleural thickening.  No active lung disease. Aortic atherosclerosis. Electronically Signed   By: Earle Gell M.D.   On: 07/03/2016 18:03    Procedures Procedures (including critical care time)  Medications Ordered in ED Medications - No data to display   Initial Impression / Assessment and Plan / ED Course  I have reviewed the triage vital signs and the nursing notes.  Pertinent labs & imaging results that were available during my care of the patient were reviewed by me and considered in my medical decision making (see chart for details).  Clinical Course    Patient was seen on nebulized up-year-old treatment here. States she felt back to baseline. Plan will be short course daily prednisone. Return to ER with no worsening symptoms. No symptoms to suggest ACS, CHF, pneumonia. She's not febrile or hypoxemic. Appropriate for discharge home.  Final Clinical Impressions(s) / ED Diagnoses   Final diagnoses:  COPD exacerbation ([place])    New Prescriptions Discharge Medication List as of 07/03/2016  8:06 PM    START taking these medications   Details  predniSONE (DELTASONE) 20 MG tablet Take 1 tablet (20 mg total) by mouth daily with breakfast., Starting Mon 07/03/2016, Print         Tanna Furry, MD 07/04/16 0030

## 2016-07-29 DIAGNOSIS — M6281 Muscle weakness (generalized): Secondary | ICD-10-CM | POA: Diagnosis not present

## 2016-07-29 DIAGNOSIS — J449 Chronic obstructive pulmonary disease, unspecified: Secondary | ICD-10-CM | POA: Diagnosis not present

## 2016-07-29 DIAGNOSIS — M1990 Unspecified osteoarthritis, unspecified site: Secondary | ICD-10-CM | POA: Diagnosis not present

## 2016-07-29 DIAGNOSIS — R609 Edema, unspecified: Secondary | ICD-10-CM | POA: Diagnosis not present

## 2016-08-08 DIAGNOSIS — J449 Chronic obstructive pulmonary disease, unspecified: Secondary | ICD-10-CM | POA: Diagnosis not present

## 2016-08-08 DIAGNOSIS — K210 Gastro-esophageal reflux disease with esophagitis: Secondary | ICD-10-CM | POA: Diagnosis not present

## 2016-08-08 DIAGNOSIS — G8929 Other chronic pain: Secondary | ICD-10-CM | POA: Diagnosis not present

## 2016-08-08 DIAGNOSIS — I2510 Atherosclerotic heart disease of native coronary artery without angina pectoris: Secondary | ICD-10-CM | POA: Diagnosis not present

## 2016-08-08 DIAGNOSIS — I10 Essential (primary) hypertension: Secondary | ICD-10-CM | POA: Diagnosis not present

## 2016-08-08 DIAGNOSIS — N390 Urinary tract infection, site not specified: Secondary | ICD-10-CM | POA: Diagnosis not present

## 2016-08-08 DIAGNOSIS — R531 Weakness: Secondary | ICD-10-CM | POA: Diagnosis not present

## 2016-08-08 DIAGNOSIS — R109 Unspecified abdominal pain: Secondary | ICD-10-CM | POA: Diagnosis not present

## 2016-08-28 DIAGNOSIS — J449 Chronic obstructive pulmonary disease, unspecified: Secondary | ICD-10-CM | POA: Diagnosis not present

## 2016-08-28 DIAGNOSIS — R609 Edema, unspecified: Secondary | ICD-10-CM | POA: Diagnosis not present

## 2016-08-28 DIAGNOSIS — M6281 Muscle weakness (generalized): Secondary | ICD-10-CM | POA: Diagnosis not present

## 2016-08-28 DIAGNOSIS — M1990 Unspecified osteoarthritis, unspecified site: Secondary | ICD-10-CM | POA: Diagnosis not present

## 2016-09-04 DIAGNOSIS — I10 Essential (primary) hypertension: Secondary | ICD-10-CM | POA: Diagnosis not present

## 2016-09-04 DIAGNOSIS — F5101 Primary insomnia: Secondary | ICD-10-CM | POA: Diagnosis not present

## 2016-09-04 DIAGNOSIS — I2510 Atherosclerotic heart disease of native coronary artery without angina pectoris: Secondary | ICD-10-CM | POA: Diagnosis not present

## 2016-09-04 DIAGNOSIS — N390 Urinary tract infection, site not specified: Secondary | ICD-10-CM | POA: Diagnosis not present

## 2016-09-04 DIAGNOSIS — G8929 Other chronic pain: Secondary | ICD-10-CM | POA: Diagnosis not present

## 2016-09-04 DIAGNOSIS — J449 Chronic obstructive pulmonary disease, unspecified: Secondary | ICD-10-CM | POA: Diagnosis not present

## 2016-09-04 DIAGNOSIS — R531 Weakness: Secondary | ICD-10-CM | POA: Diagnosis not present

## 2016-09-04 DIAGNOSIS — R109 Unspecified abdominal pain: Secondary | ICD-10-CM | POA: Diagnosis not present

## 2016-09-28 DIAGNOSIS — J449 Chronic obstructive pulmonary disease, unspecified: Secondary | ICD-10-CM | POA: Diagnosis not present

## 2016-09-28 DIAGNOSIS — M1990 Unspecified osteoarthritis, unspecified site: Secondary | ICD-10-CM | POA: Diagnosis not present

## 2016-09-28 DIAGNOSIS — M6281 Muscle weakness (generalized): Secondary | ICD-10-CM | POA: Diagnosis not present

## 2016-09-28 DIAGNOSIS — R609 Edema, unspecified: Secondary | ICD-10-CM | POA: Diagnosis not present

## 2016-10-02 DIAGNOSIS — E118 Type 2 diabetes mellitus with unspecified complications: Secondary | ICD-10-CM | POA: Diagnosis not present

## 2016-10-02 DIAGNOSIS — J449 Chronic obstructive pulmonary disease, unspecified: Secondary | ICD-10-CM | POA: Diagnosis not present

## 2016-10-02 DIAGNOSIS — G8929 Other chronic pain: Secondary | ICD-10-CM | POA: Diagnosis not present

## 2016-10-02 DIAGNOSIS — R531 Weakness: Secondary | ICD-10-CM | POA: Diagnosis not present

## 2016-10-02 DIAGNOSIS — I10 Essential (primary) hypertension: Secondary | ICD-10-CM | POA: Diagnosis not present

## 2016-10-02 DIAGNOSIS — R109 Unspecified abdominal pain: Secondary | ICD-10-CM | POA: Diagnosis not present

## 2016-10-02 DIAGNOSIS — I2510 Atherosclerotic heart disease of native coronary artery without angina pectoris: Secondary | ICD-10-CM | POA: Diagnosis not present

## 2016-10-02 DIAGNOSIS — K210 Gastro-esophageal reflux disease with esophagitis: Secondary | ICD-10-CM | POA: Diagnosis not present

## 2016-10-25 DIAGNOSIS — E118 Type 2 diabetes mellitus with unspecified complications: Secondary | ICD-10-CM | POA: Diagnosis not present

## 2016-10-25 DIAGNOSIS — Z79899 Other long term (current) drug therapy: Secondary | ICD-10-CM | POA: Diagnosis not present

## 2016-10-28 DIAGNOSIS — M1990 Unspecified osteoarthritis, unspecified site: Secondary | ICD-10-CM | POA: Diagnosis not present

## 2016-10-28 DIAGNOSIS — R609 Edema, unspecified: Secondary | ICD-10-CM | POA: Diagnosis not present

## 2016-10-28 DIAGNOSIS — J449 Chronic obstructive pulmonary disease, unspecified: Secondary | ICD-10-CM | POA: Diagnosis not present

## 2016-10-28 DIAGNOSIS — M6281 Muscle weakness (generalized): Secondary | ICD-10-CM | POA: Diagnosis not present

## 2016-10-30 DIAGNOSIS — I255 Ischemic cardiomyopathy: Secondary | ICD-10-CM | POA: Diagnosis not present

## 2016-10-30 DIAGNOSIS — F0150 Vascular dementia without behavioral disturbance: Secondary | ICD-10-CM | POA: Diagnosis not present

## 2016-10-30 DIAGNOSIS — F5101 Primary insomnia: Secondary | ICD-10-CM | POA: Diagnosis not present

## 2016-10-30 DIAGNOSIS — I10 Essential (primary) hypertension: Secondary | ICD-10-CM | POA: Diagnosis not present

## 2016-10-30 DIAGNOSIS — F339 Major depressive disorder, recurrent, unspecified: Secondary | ICD-10-CM | POA: Diagnosis not present

## 2016-10-30 DIAGNOSIS — G8929 Other chronic pain: Secondary | ICD-10-CM | POA: Diagnosis not present

## 2016-10-30 DIAGNOSIS — I2510 Atherosclerotic heart disease of native coronary artery without angina pectoris: Secondary | ICD-10-CM | POA: Diagnosis not present

## 2016-10-30 DIAGNOSIS — J449 Chronic obstructive pulmonary disease, unspecified: Secondary | ICD-10-CM | POA: Diagnosis not present

## 2016-11-10 ENCOUNTER — Inpatient Hospital Stay (HOSPITAL_COMMUNITY): Payer: Medicare Other

## 2016-11-10 ENCOUNTER — Emergency Department (HOSPITAL_COMMUNITY): Payer: Medicare Other

## 2016-11-10 ENCOUNTER — Inpatient Hospital Stay (HOSPITAL_COMMUNITY): Payer: Medicare Other | Admitting: Anesthesiology

## 2016-11-10 ENCOUNTER — Encounter (HOSPITAL_COMMUNITY)
Admission: EM | Disposition: A | Discharge status: Skilled Nursing Facility | Payer: Self-pay | Source: Home / Self Care | Attending: Internal Medicine

## 2016-11-10 ENCOUNTER — Inpatient Hospital Stay (HOSPITAL_COMMUNITY)
Admission: EM | Admit: 2016-11-10 | Discharge: 2016-11-16 | DRG: 470 | Disposition: A | Discharge status: Skilled Nursing Facility | Payer: Medicare Other | Attending: Internal Medicine | Admitting: Internal Medicine

## 2016-11-10 ENCOUNTER — Encounter (HOSPITAL_COMMUNITY): Payer: Self-pay

## 2016-11-10 DIAGNOSIS — M797 Fibromyalgia: Secondary | ICD-10-CM | POA: Diagnosis present

## 2016-11-10 DIAGNOSIS — I252 Old myocardial infarction: Secondary | ICD-10-CM | POA: Diagnosis not present

## 2016-11-10 DIAGNOSIS — Z96641 Presence of right artificial hip joint: Secondary | ICD-10-CM | POA: Diagnosis not present

## 2016-11-10 DIAGNOSIS — F0390 Unspecified dementia without behavioral disturbance: Secondary | ICD-10-CM | POA: Diagnosis present

## 2016-11-10 DIAGNOSIS — Z955 Presence of coronary angioplasty implant and graft: Secondary | ICD-10-CM | POA: Diagnosis not present

## 2016-11-10 DIAGNOSIS — Z8249 Family history of ischemic heart disease and other diseases of the circulatory system: Secondary | ICD-10-CM

## 2016-11-10 DIAGNOSIS — I2510 Atherosclerotic heart disease of native coronary artery without angina pectoris: Secondary | ICD-10-CM | POA: Diagnosis not present

## 2016-11-10 DIAGNOSIS — Z8781 Personal history of (healed) traumatic fracture: Secondary | ICD-10-CM

## 2016-11-10 DIAGNOSIS — Z833 Family history of diabetes mellitus: Secondary | ICD-10-CM

## 2016-11-10 DIAGNOSIS — B9620 Unspecified Escherichia coli [E. coli] as the cause of diseases classified elsewhere: Secondary | ICD-10-CM | POA: Diagnosis present

## 2016-11-10 DIAGNOSIS — I5032 Chronic diastolic (congestive) heart failure: Secondary | ICD-10-CM | POA: Diagnosis not present

## 2016-11-10 DIAGNOSIS — S72001D Fracture of unspecified part of neck of right femur, subsequent encounter for closed fracture with routine healing: Secondary | ICD-10-CM | POA: Diagnosis not present

## 2016-11-10 DIAGNOSIS — W1830XA Fall on same level, unspecified, initial encounter: Secondary | ICD-10-CM | POA: Diagnosis present

## 2016-11-10 DIAGNOSIS — Z7982 Long term (current) use of aspirin: Secondary | ICD-10-CM

## 2016-11-10 DIAGNOSIS — J449 Chronic obstructive pulmonary disease, unspecified: Secondary | ICD-10-CM | POA: Diagnosis not present

## 2016-11-10 DIAGNOSIS — E785 Hyperlipidemia, unspecified: Secondary | ICD-10-CM | POA: Diagnosis present

## 2016-11-10 DIAGNOSIS — S72011A Unspecified intracapsular fracture of right femur, initial encounter for closed fracture: Secondary | ICD-10-CM | POA: Diagnosis not present

## 2016-11-10 DIAGNOSIS — Z9181 History of falling: Secondary | ICD-10-CM | POA: Diagnosis not present

## 2016-11-10 DIAGNOSIS — Z87891 Personal history of nicotine dependence: Secondary | ICD-10-CM

## 2016-11-10 DIAGNOSIS — Z823 Family history of stroke: Secondary | ICD-10-CM

## 2016-11-10 DIAGNOSIS — I10 Essential (primary) hypertension: Secondary | ICD-10-CM | POA: Diagnosis present

## 2016-11-10 DIAGNOSIS — Z9889 Other specified postprocedural states: Secondary | ICD-10-CM

## 2016-11-10 DIAGNOSIS — S064X0A Epidural hemorrhage without loss of consciousness, initial encounter: Secondary | ICD-10-CM | POA: Diagnosis not present

## 2016-11-10 DIAGNOSIS — Z6826 Body mass index (BMI) 26.0-26.9, adult: Secondary | ICD-10-CM

## 2016-11-10 DIAGNOSIS — D62 Acute posthemorrhagic anemia: Secondary | ICD-10-CM | POA: Diagnosis not present

## 2016-11-10 DIAGNOSIS — W19XXXA Unspecified fall, initial encounter: Secondary | ICD-10-CM

## 2016-11-10 DIAGNOSIS — I110 Hypertensive heart disease with heart failure: Secondary | ICD-10-CM | POA: Diagnosis not present

## 2016-11-10 DIAGNOSIS — S299XXA Unspecified injury of thorax, initial encounter: Secondary | ICD-10-CM | POA: Diagnosis not present

## 2016-11-10 DIAGNOSIS — E663 Overweight: Secondary | ICD-10-CM | POA: Diagnosis present

## 2016-11-10 DIAGNOSIS — Z471 Aftercare following joint replacement surgery: Secondary | ICD-10-CM | POA: Diagnosis not present

## 2016-11-10 DIAGNOSIS — E119 Type 2 diabetes mellitus without complications: Secondary | ICD-10-CM | POA: Diagnosis not present

## 2016-11-10 DIAGNOSIS — I5022 Chronic systolic (congestive) heart failure: Secondary | ICD-10-CM | POA: Diagnosis not present

## 2016-11-10 DIAGNOSIS — Z1612 Extended spectrum beta lactamase (ESBL) resistance: Secondary | ICD-10-CM | POA: Diagnosis present

## 2016-11-10 DIAGNOSIS — S098XXA Other specified injuries of head, initial encounter: Secondary | ICD-10-CM | POA: Diagnosis not present

## 2016-11-10 DIAGNOSIS — R262 Difficulty in walking, not elsewhere classified: Secondary | ICD-10-CM | POA: Diagnosis not present

## 2016-11-10 DIAGNOSIS — Z7984 Long term (current) use of oral hypoglycemic drugs: Secondary | ICD-10-CM

## 2016-11-10 DIAGNOSIS — Z7902 Long term (current) use of antithrombotics/antiplatelets: Secondary | ICD-10-CM

## 2016-11-10 DIAGNOSIS — Z66 Do not resuscitate: Secondary | ICD-10-CM | POA: Diagnosis present

## 2016-11-10 DIAGNOSIS — M1990 Unspecified osteoarthritis, unspecified site: Secondary | ICD-10-CM | POA: Diagnosis present

## 2016-11-10 DIAGNOSIS — S79911A Unspecified injury of right hip, initial encounter: Secondary | ICD-10-CM | POA: Diagnosis not present

## 2016-11-10 DIAGNOSIS — K219 Gastro-esophageal reflux disease without esophagitis: Secondary | ICD-10-CM | POA: Diagnosis present

## 2016-11-10 DIAGNOSIS — Z79899 Other long term (current) drug therapy: Secondary | ICD-10-CM

## 2016-11-10 DIAGNOSIS — Z7951 Long term (current) use of inhaled steroids: Secondary | ICD-10-CM | POA: Diagnosis not present

## 2016-11-10 DIAGNOSIS — N390 Urinary tract infection, site not specified: Secondary | ICD-10-CM | POA: Diagnosis present

## 2016-11-10 DIAGNOSIS — G4730 Sleep apnea, unspecified: Secondary | ICD-10-CM | POA: Diagnosis present

## 2016-11-10 DIAGNOSIS — Z794 Long term (current) use of insulin: Secondary | ICD-10-CM

## 2016-11-10 DIAGNOSIS — H409 Unspecified glaucoma: Secondary | ICD-10-CM | POA: Diagnosis present

## 2016-11-10 DIAGNOSIS — R51 Headache: Secondary | ICD-10-CM | POA: Diagnosis not present

## 2016-11-10 DIAGNOSIS — M25551 Pain in right hip: Secondary | ICD-10-CM | POA: Diagnosis not present

## 2016-11-10 DIAGNOSIS — I255 Ischemic cardiomyopathy: Secondary | ICD-10-CM | POA: Diagnosis not present

## 2016-11-10 DIAGNOSIS — M6281 Muscle weakness (generalized): Secondary | ICD-10-CM | POA: Diagnosis not present

## 2016-11-10 DIAGNOSIS — S72001A Fracture of unspecified part of neck of right femur, initial encounter for closed fracture: Secondary | ICD-10-CM | POA: Diagnosis present

## 2016-11-10 DIAGNOSIS — S0990XA Unspecified injury of head, initial encounter: Secondary | ICD-10-CM | POA: Diagnosis not present

## 2016-11-10 DIAGNOSIS — R488 Other symbolic dysfunctions: Secondary | ICD-10-CM | POA: Diagnosis not present

## 2016-11-10 DIAGNOSIS — S199XXA Unspecified injury of neck, initial encounter: Secondary | ICD-10-CM | POA: Diagnosis not present

## 2016-11-10 DIAGNOSIS — M542 Cervicalgia: Secondary | ICD-10-CM | POA: Diagnosis not present

## 2016-11-10 HISTORY — PX: HIP ARTHROPLASTY: SHX981

## 2016-11-10 LAB — CBC WITH DIFFERENTIAL/PLATELET
BASOS ABS: 0 10*3/uL (ref 0.0–0.1)
BASOS PCT: 0 %
EOS ABS: 0.1 10*3/uL (ref 0.0–0.7)
EOS PCT: 1 %
HCT: 41 % (ref 36.0–46.0)
HEMOGLOBIN: 13.8 g/dL (ref 12.0–15.0)
Lymphocytes Relative: 16 %
Lymphs Abs: 2 10*3/uL (ref 0.7–4.0)
MCH: 33 pg (ref 26.0–34.0)
MCHC: 33.7 g/dL (ref 30.0–36.0)
MCV: 98.1 fL (ref 78.0–100.0)
Monocytes Absolute: 0.5 10*3/uL (ref 0.1–1.0)
Monocytes Relative: 4 %
NEUTROS PCT: 79 %
Neutro Abs: 10.3 10*3/uL — ABNORMAL HIGH (ref 1.7–7.7)
PLATELETS: 198 10*3/uL (ref 150–400)
RBC: 4.18 MIL/uL (ref 3.87–5.11)
RDW: 12.7 % (ref 11.5–15.5)
WBC: 13 10*3/uL — AB (ref 4.0–10.5)

## 2016-11-10 LAB — GLUCOSE, CAPILLARY
GLUCOSE-CAPILLARY: 169 mg/dL — AB (ref 65–99)
GLUCOSE-CAPILLARY: 211 mg/dL — AB (ref 65–99)
Glucose-Capillary: 191 mg/dL — ABNORMAL HIGH (ref 65–99)

## 2016-11-10 LAB — URINALYSIS, ROUTINE W REFLEX MICROSCOPIC
Bilirubin Urine: NEGATIVE
Glucose, UA: NEGATIVE mg/dL
Hgb urine dipstick: NEGATIVE
Ketones, ur: NEGATIVE mg/dL
Nitrite: NEGATIVE
PROTEIN: NEGATIVE mg/dL
SPECIFIC GRAVITY, URINE: 1.011 (ref 1.005–1.030)
pH: 5 (ref 5.0–8.0)

## 2016-11-10 LAB — BASIC METABOLIC PANEL
Anion gap: 9 (ref 5–15)
BUN: 17 mg/dL (ref 6–20)
BUN: 17 mg/dL (ref 4–21)
CALCIUM: 9.5 mg/dL (ref 8.9–10.3)
CHLORIDE: 101 mmol/L (ref 101–111)
CO2: 28 mmol/L (ref 22–32)
CREATININE: 0.93 mg/dL (ref 0.44–1.00)
Creatinine: 0.9 mg/dL (ref 0.5–1.1)
GFR, EST NON AFRICAN AMERICAN: 59 mL/min — AB (ref >60)
GLUCOSE: 164 mg/dL
Glucose, Bld: 164 mg/dL — ABNORMAL HIGH (ref 65–99)
Potassium: 4.2 mmol/L (ref 3.4–5.3)
Potassium: 4.2 mmol/L (ref 3.5–5.1)
SODIUM: 138 mmol/L (ref 135–145)
SODIUM: 138 mmol/L (ref 137–147)

## 2016-11-10 LAB — PROTIME-INR
INR: 1.04
PROTHROMBIN TIME: 13.6 s (ref 11.4–15.2)

## 2016-11-10 LAB — CBC AND DIFFERENTIAL
HCT: 41 % (ref 36–46)
Hemoglobin: 13.8 g/dL (ref 12.0–16.0)
PLATELETS: 198 10*3/uL (ref 150–399)
WBC: 13 10^3/mL

## 2016-11-10 LAB — TYPE AND SCREEN
ABO/RH(D): O POS
ANTIBODY SCREEN: NEGATIVE

## 2016-11-10 LAB — SURGICAL PCR SCREEN
MRSA, PCR: NEGATIVE
STAPHYLOCOCCUS AUREUS: POSITIVE — AB

## 2016-11-10 SURGERY — HEMIARTHROPLASTY, HIP, DIRECT ANTERIOR APPROACH, FOR FRACTURE
Anesthesia: General | Site: Hip | Laterality: Right

## 2016-11-10 MED ORDER — PHENYLEPHRINE HCL 10 MG/ML IJ SOLN
INTRAMUSCULAR | Status: DC | PRN
  Administered 2016-11-10: 80 ug via INTRAVENOUS

## 2016-11-10 MED ORDER — ASPIRIN EC 81 MG PO TBEC: 81.0000 mg | DELAYED_RELEASE_TABLET | Freq: Every day | ORAL | Status: DC

## 2016-11-10 MED ORDER — ISOSORBIDE MONONITRATE ER 30 MG PO TB24
15.0000 mg | ORAL_TABLET | Freq: Every day | ORAL | Status: DC
  Administered 2016-11-11 – 2016-11-16 (×6): 15 mg via ORAL
  Filled 2016-11-10 (×6): qty 1

## 2016-11-10 MED ORDER — SODIUM CHLORIDE 0.9 % IV SOLN
75.0000 mL/h | INTRAVENOUS | Status: DC
  Administered 2016-11-11: 75 mL/h via INTRAVENOUS

## 2016-11-10 MED ORDER — BUPIVACAINE HCL (PF) 0.25 % IJ SOLN
INTRAMUSCULAR | Status: DC | PRN
  Administered 2016-11-10: 20 mL

## 2016-11-10 MED ORDER — FENTANYL CITRATE (PF) 100 MCG/2ML IJ SOLN
INTRAMUSCULAR | Status: DC | PRN
Start: 2016-11-10 — End: 2016-11-10
  Administered 2016-11-10 (×2): 50 ug via INTRAVENOUS

## 2016-11-10 MED ORDER — BUPIVACAINE HCL (PF) 0.25 % IJ SOLN
INTRAMUSCULAR | Status: AC
  Filled 2016-11-10: qty 30

## 2016-11-10 MED ORDER — MIRTAZAPINE 15 MG PO TBDP
15.0000 mg | ORAL_TABLET | Freq: Every day | ORAL | Status: DC
  Administered 2016-11-11 – 2016-11-15 (×5): 15 mg via ORAL
  Filled 2016-11-10 (×6): qty 1

## 2016-11-10 MED ORDER — INSULIN GLARGINE 100 UNITS/ML SOLOSTAR PEN
17.0000 [IU] | PEN_INJECTOR | Freq: Every day | SUBCUTANEOUS | Status: DC
  Filled 2016-11-10: qty 3

## 2016-11-10 MED ORDER — MEMANTINE HCL 10 MG PO TABS
10.0000 mg | ORAL_TABLET | Freq: Two times a day (BID) | ORAL | Status: DC
  Administered 2016-11-11 – 2016-11-16 (×11): 10 mg via ORAL
  Filled 2016-11-10 (×13): qty 1

## 2016-11-10 MED ORDER — ALBUTEROL SULFATE (2.5 MG/3ML) 0.083% IN NEBU
2.5000 mg | INHALATION_SOLUTION | Freq: Four times a day (QID) | RESPIRATORY_TRACT | Status: DC | PRN
  Administered 2016-11-10: 2.5 mg via RESPIRATORY_TRACT

## 2016-11-10 MED ORDER — CHLORHEXIDINE GLUCONATE 4 % EX LIQD
60.0000 mL | Freq: Once | CUTANEOUS | Status: AC
  Administered 2016-11-10: 4 via TOPICAL

## 2016-11-10 MED ORDER — METOPROLOL SUCCINATE ER 25 MG PO TB24
12.5000 mg | ORAL_TABLET | Freq: Every day | ORAL | Status: DC
  Administered 2016-11-10 – 2016-11-16 (×7): 12.5 mg via ORAL
  Filled 2016-11-10 (×7): qty 1

## 2016-11-10 MED ORDER — BACLOFEN 10 MG PO TABS: 10.0000 mg | ORAL_TABLET | Freq: Three times a day (TID) | ORAL | 0 refills | Status: DC

## 2016-11-10 MED ORDER — INSULIN ASPART 100 UNIT/ML ~~LOC~~ SOLN
0.0000 [IU] | SUBCUTANEOUS | Status: DC
  Administered 2016-11-11 (×3): 2 [IU] via SUBCUTANEOUS

## 2016-11-10 MED ORDER — 0.9 % SODIUM CHLORIDE (POUR BTL) OPTIME
TOPICAL | Status: DC | PRN
  Administered 2016-11-10: 1000 mL

## 2016-11-10 MED ORDER — POVIDONE-IODINE 10 % EX SWAB
2.0000 "application " | Freq: Once | CUTANEOUS | Status: AC
  Administered 2016-11-10: 2 via TOPICAL

## 2016-11-10 MED ORDER — SUGAMMADEX SODIUM 200 MG/2ML IV SOLN
INTRAVENOUS | Status: DC | PRN
  Administered 2016-11-10: 200 mg via INTRAVENOUS

## 2016-11-10 MED ORDER — PROPOFOL 10 MG/ML IV BOLUS
INTRAVENOUS | Status: DC | PRN
  Administered 2016-11-10: 50 mg via INTRAVENOUS

## 2016-11-10 MED ORDER — ALBUTEROL SULFATE (2.5 MG/3ML) 0.083% IN NEBU
INHALATION_SOLUTION | RESPIRATORY_TRACT | Status: AC
  Administered 2016-11-10: 2.5 mg via RESPIRATORY_TRACT
  Filled 2016-11-10: qty 3

## 2016-11-10 MED ORDER — MORPHINE SULFATE (PF) 2 MG/ML IV SOLN
0.5000 mg | INTRAVENOUS | Status: DC | PRN
  Administered 2016-11-10: 1 mg via INTRAVENOUS
  Filled 2016-11-10: qty 1

## 2016-11-10 MED ORDER — LIDOCAINE HCL (CARDIAC) 20 MG/ML IV SOLN
INTRAVENOUS | Status: DC | PRN
  Administered 2016-11-10: 60 mg via INTRAVENOUS

## 2016-11-10 MED ORDER — FENTANYL CITRATE (PF) 100 MCG/2ML IJ SOLN
50.0000 ug | INTRAMUSCULAR | Status: AC | PRN
  Administered 2016-11-10 (×2): 50 ug via INTRAVENOUS
  Filled 2016-11-10 (×2): qty 2

## 2016-11-10 MED ORDER — FENTANYL CITRATE (PF) 100 MCG/2ML IJ SOLN
25.0000 ug | INTRAMUSCULAR | Status: DC | PRN
  Administered 2016-11-10: 25 ug via INTRAVENOUS
  Administered 2016-11-10: 50 ug via INTRAVENOUS
  Administered 2016-11-10: 25 ug via INTRAVENOUS

## 2016-11-10 MED ORDER — LORAZEPAM 0.5 MG PO TABS
0.5000 mg | ORAL_TABLET | Freq: Two times a day (BID) | ORAL | Status: DC | PRN
  Administered 2016-11-11 – 2016-11-16 (×2): 0.5 mg via ORAL
  Filled 2016-11-10 (×2): qty 1

## 2016-11-10 MED ORDER — IPRATROPIUM BROMIDE 0.02 % IN SOLN
0.5000 mg | Freq: Every day | RESPIRATORY_TRACT | Status: DC
  Administered 2016-11-11: 0.5 mg via RESPIRATORY_TRACT
  Filled 2016-11-10: qty 2.5

## 2016-11-10 MED ORDER — HYDROCODONE-ACETAMINOPHEN 5-325 MG PO TABS
1.0000 | ORAL_TABLET | ORAL | Status: DC | PRN
  Administered 2016-11-10: 1 via ORAL
  Administered 2016-11-10: 2 via ORAL
  Filled 2016-11-10: qty 1
  Filled 2016-11-10: qty 2

## 2016-11-10 MED ORDER — ENOXAPARIN SODIUM 40 MG/0.4ML ~~LOC~~ SOLN: 40.0000 mg | SUBCUTANEOUS | 0 refills | Status: DC

## 2016-11-10 MED ORDER — SENNA-DOCUSATE SODIUM 8.6-50 MG PO TABS: 2.0000 | ORAL_TABLET | Freq: Every day | ORAL | 1 refills | Status: AC

## 2016-11-10 MED ORDER — PANTOPRAZOLE SODIUM 40 MG PO TBEC
40.0000 mg | DELAYED_RELEASE_TABLET | Freq: Every day | ORAL | Status: DC
  Administered 2016-11-11 – 2016-11-16 (×6): 40 mg via ORAL
  Filled 2016-11-10 (×6): qty 1

## 2016-11-10 MED ORDER — ROCURONIUM BROMIDE 100 MG/10ML IV SOLN
INTRAVENOUS | Status: DC | PRN
  Administered 2016-11-10: 50 mg via INTRAVENOUS

## 2016-11-10 MED ORDER — ATORVASTATIN CALCIUM 40 MG PO TABS
40.0000 mg | ORAL_TABLET | Freq: Every day | ORAL | Status: DC
  Administered 2016-11-11 – 2016-11-16 (×6): 40 mg via ORAL
  Filled 2016-11-10 (×6): qty 1

## 2016-11-10 MED ORDER — MUPIROCIN 2 % EX OINT
1.0000 "application " | TOPICAL_OINTMENT | Freq: Two times a day (BID) | CUTANEOUS | Status: AC
  Administered 2016-11-10 – 2016-11-14 (×9): 1 via NASAL
  Filled 2016-11-10: qty 22

## 2016-11-10 MED ORDER — INSULIN GLARGINE 100 UNIT/ML ~~LOC~~ SOLN
8.0000 [IU] | Freq: Every day | SUBCUTANEOUS | Status: DC
  Administered 2016-11-11 – 2016-11-12 (×2): 8 [IU] via SUBCUTANEOUS
  Filled 2016-11-10 (×3): qty 0.08

## 2016-11-10 MED ORDER — CEFAZOLIN SODIUM-DEXTROSE 2-4 GM/100ML-% IV SOLN
2.0000 g | INTRAVENOUS | Status: AC
  Administered 2016-11-10: 2 g via INTRAVENOUS
  Filled 2016-11-10: qty 100

## 2016-11-10 MED ORDER — INSULIN GLARGINE 100 UNIT/ML ~~LOC~~ SOLN: 17.0000 [IU] | Freq: Every day | SUBCUTANEOUS | Status: DC

## 2016-11-10 MED ORDER — PROPOFOL 10 MG/ML IV BOLUS
INTRAVENOUS | Status: AC
  Filled 2016-11-10: qty 20

## 2016-11-10 MED ORDER — CHLORHEXIDINE GLUCONATE CLOTH 2 % EX PADS
6.0000 | MEDICATED_PAD | Freq: Every day | CUTANEOUS | Status: DC
  Administered 2016-11-10: 6 via TOPICAL

## 2016-11-10 MED ORDER — FENTANYL CITRATE (PF) 100 MCG/2ML IJ SOLN
INTRAMUSCULAR | Status: AC
  Filled 2016-11-10: qty 4

## 2016-11-10 MED ORDER — ACETAMINOPHEN 325 MG PO TABS: 650.0000 mg | ORAL_TABLET | Freq: Four times a day (QID) | ORAL | 1 refills | Status: DC | PRN

## 2016-11-10 MED ORDER — LACTATED RINGERS IV SOLN
INTRAVENOUS | Status: DC | PRN
  Administered 2016-11-10 (×2): via INTRAVENOUS

## 2016-11-10 MED ORDER — SODIUM CHLORIDE 0.9 % IV SOLN
INTRAVENOUS | Status: DC
  Administered 2016-11-10: 100 mL/h via INTRAVENOUS

## 2016-11-10 MED ORDER — FENTANYL CITRATE (PF) 100 MCG/2ML IJ SOLN
INTRAMUSCULAR | Status: AC
  Administered 2016-11-10: 25 ug via INTRAVENOUS
  Filled 2016-11-10: qty 2

## 2016-11-10 MED ORDER — ALBUTEROL SULFATE HFA 108 (90 BASE) MCG/ACT IN AERS
INHALATION_SPRAY | RESPIRATORY_TRACT | Status: DC | PRN
  Administered 2016-11-10: 4 via RESPIRATORY_TRACT

## 2016-11-10 MED ORDER — MOMETASONE FURO-FORMOTEROL FUM 100-5 MCG/ACT IN AERO
2.0000 | INHALATION_SPRAY | Freq: Two times a day (BID) | RESPIRATORY_TRACT | Status: DC
  Administered 2016-11-11 – 2016-11-16 (×11): 2 via RESPIRATORY_TRACT
  Filled 2016-11-10: qty 8.8

## 2016-11-10 SURGICAL SUPPLY — 63 items
APL SKNCLS STERI-STRIP NONHPOA (GAUZE/BANDAGES/DRESSINGS) ×1
BENZOIN TINCTURE PRP APPL 2/3 (GAUZE/BANDAGES/DRESSINGS) ×2 IMPLANT
BLADE SAW SGTL 18.5X63.X.64 HD (BLADE) ×2 IMPLANT
BRUSH FEMORAL CANAL (MISCELLANEOUS) IMPLANT
CAPT HIP HEMI 1 ×1 IMPLANT
CLSR STERI-STRIP ANTIMIC 1/2X4 (GAUZE/BANDAGES/DRESSINGS) ×2 IMPLANT
COVER BACK TABLE 24X17X13 BIG (DRAPES) IMPLANT
COVER SURGICAL LIGHT HANDLE (MISCELLANEOUS) ×2 IMPLANT
DECANTER SPIKE VIAL GLASS SM (MISCELLANEOUS) ×1 IMPLANT
DRAPE IMP U-DRAPE 54X76 (DRAPES) ×2 IMPLANT
DRAPE INCISE IOBAN 66X45 STRL (DRAPES) IMPLANT
DRAPE ORTHO SPLIT 77X108 STRL (DRAPES) ×4
DRAPE SURG ORHT 6 SPLT 77X108 (DRAPES) ×2 IMPLANT
DRAPE U-SHAPE 47X51 STRL (DRAPES) ×2 IMPLANT
DRILL BIT 5/64 (BIT) ×2 IMPLANT
DRSG MEPILEX BORDER 4X12 (GAUZE/BANDAGES/DRESSINGS) IMPLANT
DRSG MEPILEX BORDER 4X8 (GAUZE/BANDAGES/DRESSINGS) ×1 IMPLANT
DURAPREP 26ML APPLICATOR (WOUND CARE) ×2 IMPLANT
ELECT BLADE 6.5 EXT (BLADE) IMPLANT
ELECT CAUTERY BLADE 6.4 (BLADE) ×2 IMPLANT
ELECT REM PT RETURN 9FT ADLT (ELECTROSURGICAL) ×2
ELECTRODE REM PT RTRN 9FT ADLT (ELECTROSURGICAL) ×1 IMPLANT
FACESHIELD WRAPAROUND (MASK) ×4 IMPLANT
FACESHIELD WRAPAROUND OR TEAM (MASK) ×2 IMPLANT
GLOVE BIOGEL PI IND STRL 6.5 (GLOVE) IMPLANT
GLOVE BIOGEL PI INDICATOR 6.5 (GLOVE) ×1
GLOVE BIOGEL PI ORTHO PRO SZ8 (GLOVE) ×1
GLOVE ORTHO TXT STRL SZ7.5 (GLOVE) ×2 IMPLANT
GLOVE PI ORTHO PRO STRL SZ8 (GLOVE) ×1 IMPLANT
GLOVE SURG ORTHO 8.0 STRL STRW (GLOVE) ×4 IMPLANT
GOWN STRL REUS W/ TWL XL LVL3 (GOWN DISPOSABLE) ×1 IMPLANT
GOWN STRL REUS W/TWL 2XL LVL3 (GOWN DISPOSABLE) ×2 IMPLANT
GOWN STRL REUS W/TWL XL LVL3 (GOWN DISPOSABLE) ×2
HANDPIECE INTERPULSE COAX TIP (DISPOSABLE)
KIT BASIN OR (CUSTOM PROCEDURE TRAY) ×2 IMPLANT
KIT ROOM TURNOVER OR (KITS) ×2 IMPLANT
MANIFOLD NEPTUNE II (INSTRUMENTS) ×2 IMPLANT
NDL SUT 6 .5 CRC .975X.05 MAYO (NEEDLE) IMPLANT
NEEDLE 22X1 1/2 (OR ONLY) (NEEDLE) ×2 IMPLANT
NEEDLE MAYO TAPER (NEEDLE)
NS IRRIG 1000ML POUR BTL (IV SOLUTION) ×2 IMPLANT
PACK TOTAL JOINT (CUSTOM PROCEDURE TRAY) ×2 IMPLANT
PACK UNIVERSAL I (CUSTOM PROCEDURE TRAY) ×2 IMPLANT
PAD ARMBOARD 7.5X6 YLW CONV (MISCELLANEOUS) ×4 IMPLANT
PASSER SUT SWANSON 36MM LOOP (INSTRUMENTS) ×1 IMPLANT
PILLOW ABDUCTION HIP (SOFTGOODS) ×2 IMPLANT
PRESSURIZER FEMORAL UNIV (MISCELLANEOUS) IMPLANT
RETRIEVER SUT HEWSON (MISCELLANEOUS) ×2 IMPLANT
SET HNDPC FAN SPRY TIP SCT (DISPOSABLE) IMPLANT
SUT FIBERWIRE #2 38 REV NDL BL (SUTURE) ×8
SUT MNCRL AB 4-0 PS2 18 (SUTURE) ×2 IMPLANT
SUT VIC AB 0 CT1 27 (SUTURE) ×2
SUT VIC AB 0 CT1 27XBRD ANBCTR (SUTURE) ×1 IMPLANT
SUT VIC AB 1 CT1 27 (SUTURE) ×4
SUT VIC AB 1 CT1 27XBRD ANBCTR (SUTURE) ×2 IMPLANT
SUT VIC AB 3-0 SH 8-18 (SUTURE) ×2 IMPLANT
SUTURE FIBERWR#2 38 REV NDL BL (SUTURE) ×2 IMPLANT
SYR CONTROL 10ML LL (SYRINGE) ×2 IMPLANT
TOWEL OR 17X24 6PK STRL BLUE (TOWEL DISPOSABLE) ×2 IMPLANT
TOWEL OR 17X26 10 PK STRL BLUE (TOWEL DISPOSABLE) ×2 IMPLANT
TOWER CARTRIDGE SMART MIX (DISPOSABLE) IMPLANT
TRAY FOLEY CATH 16FRSI W/METER (SET/KITS/TRAYS/PACK) ×1 IMPLANT
WATER STERILE IRR 1000ML POUR (IV SOLUTION) ×8 IMPLANT

## 2016-11-10 NOTE — Discharge Instructions (Signed)

## 2016-11-10 NOTE — ED Provider Notes (Signed)
[place] DEPT Provider Note   CSN: [id number] Arrival date & time: 11/10/16  [id number]     History   Chief Complaint Chief Complaint  Patient presents with  . Fall    HPI [name] is a 75 y.o. female.  HPI Patient presents after mechanical fall with pain in the posterior head and right hip. She recalls being in her usual state of health, when she slipped. She did not lose consciousness, before or after the event, has no subsequent confusion, disorientation. Patient lives at a nursing facility, does have some diagnosis of right loss. However, she denies any new weakness anywhere, any vision changes, and seems to describe the event in its entirety. Patient states that she takes her medication as directed, though she is unsure of her medications. Since the fall she has had severe pain in the medial proximal right hip and worse with any attempt at motion. EMS reports the patient was hemodynamically stable en route, and she did not receive pain medication.  Past Medical History:  Diagnosis Date  . Anxiety   . CAD (coronary artery disease)    a. s/p inferior STEMI 12/29/10 with rotablator atherectomy RCA 12/31/10 and DES x 2 RCA;  b. Lexiscan Myoview (02/2015): mild reversible apical anterior perfusion defect. C. cath 02/2015 50% LAD lesion with patent stent, Tx Rx    . COPD (chronic obstructive pulmonary disease) ([place])   . Dementia   . Diabetes mellitus    type 2  . Diverticulosis   . GERD (gastroesophageal reflux disease)   . Glaucoma   . HTN (hypertension)   . Hyperlipidemia   . Ischemic cardiomyopathy    EF 45%cath 2012 EF55% 5/12; 35% 11/14  . Memory deficit   . Nephrolithiasis   . Osteoarthritis   . Overweight(278.02)   . Respiratory arrest//ACE Inhibitor presumed cause   . Sleep apnea   . Small bowel obstruction    from a sigmoid stricture    Patient Active Problem List   Diagnosis Date Noted  . Lactic acidosis 01/02/2016  . Encephalopathy acute  01/02/2016  . Acute encephalopathy 01/02/2016  . 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 UTI (urinary tract infection) 10/06/2015  . Obesity (BMI 30-39.9) 10/06/2015  . COLD (chronic obstructive lung disease) ([place]) 07/22/2015  . Tobacco use disorder 03/17/2015  . Cardiomyopathy, ischemic 03/17/2015  . Allergy to ACE inhibitors 03/17/2015  . Intolerance of drug 03/17/2015  . Ventricular tachycardia ([place]) 03/17/2015  . Chest pain 03/16/2015  . HLD (hyperlipidemia) 12/31/2014  . COPD (chronic obstructive pulmonary disease) ([place]) 12/31/2014  . HCAP (healthcare-associated pneumonia) 12/31/2014  . Diabetes mellitus without complication ([place])   . Atelectasis 01/23/2014  . Chronic systolic heart failure ([place]) 10/12/2013  . Acute diastolic CHF (congestive heart failure) ([place]) 10/10/2013  . Hypomagnesemia 10/10/2013  . Chronic cough 09/12/2013  . Acute respiratory failure ([place]) 05/20/2013  . Memory deficit 04/23/2013  . Essential hypertension, benign 04/03/2013  . Dyslipidemia 04/03/2013  . COPD exacerbation ([place]) 04/03/2013  . Coronary atherosclerosis of native coronary artery 04/03/2013  . DM2 (diabetes mellitus, type 2) ([place]) 04/02/2013  . Hyperglycemia 04/02/2013  . Hypokalemia 04/02/2013  . Fibromyalgia 04/20/2008    Past Surgical History:  Procedure Laterality Date  . bilateral tubal ligation    . CARDIAC CATHETERIZATION N/A 03/18/2015   Procedure: LEFT HEART CATH AND CORONARY ANGIOGRAPHY;  Surgeon: Lorretta Harp, MD;  Location: [facility] CATH LAB;  Service: Cardiovascular;  Laterality: N/A;  . CARDIAC SURGERY    .  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 LITHOTRIPSY      OB History    No data available       Home Medications    Prior to Admission medications   Medication Sig Start Date End Date Taking? Authorizing Provider  albuterol (ACCUNEB) 0.63 MG/3ML nebulizer solution Take 1 ampule by nebulization daily.   Yes  Historical Provider, MD  albuterol (PROVENTIL HFA;VENTOLIN HFA) 108 (90 BASE) MCG/ACT inhaler Inhale 2 puffs into the lungs every 6 (six) hours as needed for wheezing or shortness of breath.    Yes Historical Provider, MD  aspirin EC 81 MG tablet Take 81 mg by mouth daily.   Yes Historical Provider, MD  atorvastatin (LIPITOR) 40 MG tablet Take 40 mg by mouth daily.   Yes Historical Provider, MD  clopidogrel (PLAVIX) 75 MG tablet Take 75 mg by mouth daily.   Yes Historical Provider, MD  furosemide (LASIX) 20 MG tablet Take 20 mg by mouth daily.    Yes Historical Provider, MD  insulin glargine (LANTUS) 100 unit/mL SOPN Inject 17 Units into the skin at bedtime.    Yes Historical Provider, MD  insulin lispro (HUMALOG KWIKPEN) 100 UNIT/ML KiwkPen Inject 4 Units into the skin 3 (three) times daily with meals.   Yes Historical Provider, MD  ipratropium (ATROVENT) 0.02 % nebulizer solution Take 0.5 mg by nebulization daily.   Yes Historical Provider, MD  isosorbide mononitrate (IMDUR) 30 MG 24 hr tablet Take 0.5 tablets (15 mg total) by mouth daily. 10/25/15  Yes Isaiah Serge, NP  LORazepam (ATIVAN) 0.5 MG tablet Take 1 tablet (0.5 mg total) by mouth 2 (two) times daily as needed for anxiety. 01/04/16  Yes Shanker Kristeen Mans, MD  memantine (NAMENDA) 10 MG tablet Take 10 mg by mouth 2 (two) times daily.   Yes Historical Provider, MD  metFORMIN (GLUCOPHAGE) 1000 MG tablet Take 1,000 mg by mouth 2 (two) times daily with a meal.    Yes Historical Provider, MD  metoprolol succinate (TOPROL XL) 25 MG 24 hr tablet Take 0.5 tablets (12.5 mg total) by mouth daily. 02/05/16  Yes Donne Hazel, MD  mirtazapine (REMERON SOL-TAB) 15 MG disintegrating tablet Take 1 tablet (15 mg total) by mouth at bedtime. 01/04/16  Yes Shanker Kristeen Mans, MD  mometasone-formoterol (DULERA) 100-5 MCG/ACT AERO Inhale 2 puffs into the lungs 2 (two) times daily.   Yes Historical Provider, MD  nitroGLYCERIN (NITROSTAT) 0.4 MG SL tablet Place 0.4  mg under the tongue every 5 (five) minutes as needed for chest pain.   Yes Historical Provider, MD  omeprazole (PRILOSEC) 20 MG capsule Take 20 mg by mouth daily before breakfast.    Yes Historical Provider, MD  oxyCODONE-acetaminophen (PERCOCET/ROXICET) 5-325 MG tablet Take 1 tablet by mouth every 6 (six) hours as needed for moderate pain or severe pain. Patient not taking: Reported on 11/10/2016 01/04/16   Jonetta Osgood, MD    Family History Family History  Problem Relation Age of Onset  . Diabetes Father   . Heart disease Father   . Heart attack Father   . Stroke Mother   . Breast cancer Maternal Grandmother   . Diabetes Sister   . Hypertension Sister   . Colon cancer Neg Hx     Social History Social History  Substance Use Topics  . Smoking status: Current Every Day Smoker    Packs/day: 1.00    Years: 25.00    Types: Cigarettes  .  Smokeless tobacco: Never Used     Comment: over a ppd for 40+ years; quit 10/2004.  unsuccessfully tried eCigs.  . Alcohol use No     Allergies   Lisinopril; Spironolactone; Vancomycin; Donepezil; Eggs or egg-derived products; Erythromycin; Macrolides and ketolides; Penicillins; Quinapril hcl; Angiotensin receptor blockers; Lipitor [atorvastatin]; Pentazocine lactate; Propoxyphene hcl; and Sulfonamide derivatives   Review of Systems Review of Systems  Constitutional:       Per HPI, otherwise negative  HENT:       Per HPI, otherwise negative  Respiratory:       Per HPI, otherwise negative  Cardiovascular:       Per HPI, otherwise negative  Gastrointestinal: Negative for vomiting.  Endocrine:       Negative aside from HPI  Genitourinary:       Neg aside from HPI   Musculoskeletal:       Per HPI, otherwise negative  Skin: Negative.   Neurological: Negative for syncope.  Psychiatric/Behavioral: Positive for confusion.     Physical Exam Updated Vital Signs BP 146/77 (BP Location: Right Arm)   Pulse 86   Temp 97.5 F (36.4 C)  (Oral)   Resp 16   SpO2 100%   Physical Exam  Constitutional: She is oriented to person, place, and time. She appears well-developed and well-nourished. No distress.  HENT:  Head: Normocephalic and atraumatic.  Mild tenderness about the right posterior scalp, no appreciable lesion  Eyes: Conjunctivae and EOM are normal.  Neck:  Patient can move the neck freely, has no point tenderness, presents in c-collar  Cardiovascular: Normal rate and regular rhythm.   Pulmonary/Chest: Effort normal and breath sounds normal. No stridor. No respiratory distress.  Abdominal: She exhibits no distension.  Musculoskeletal: She exhibits no edema.  Left leg unremarkable, right leg with tenderness throughout the proximal thigh, patient unwilling to move the hip secondary to pain. Knee and ankle, right, grossly unremarkable, though the patient will not move either secondary to pain in the hip.   Neurological: She is alert and oriented to person, place, and time. No cranial nerve deficit.  Skin: Skin is warm and dry.  Psychiatric: She has a normal mood and affect.  Slight delayed, but patient is essentially normal  Nursing note and vitals reviewed.    ED Treatments / Results  Labs (all labs ordered are listed, but only abnormal results are displayed) Labs Reviewed  BASIC METABOLIC PANEL - Abnormal; Notable for the following:       Result Value   Glucose, Bld 164 (*)    GFR calc non Af Amer 59 (*)    All other components within normal limits  CBC WITH DIFFERENTIAL/PLATELET - Abnormal; Notable for the following:    WBC 13.0 (*)    Neutro Abs 10.3 (*)    All other components within normal limits  PROTIME-INR  TYPE AND SCREEN    Radiology Dg Chest 1 View  Result Date: 11/10/2016 CLINICAL DATA:  75 year old female status post mechanical fall earlier today. Right hip pain. EXAM: CHEST 1 VIEW COMPARISON:  Prior chest x-ray 07/03/2016 FINDINGS: Stable mild cardiomegaly. Mediastinal contours remain  within normal limits. Atherosclerotic calcifications again noted in the transverse aorta. Chronic lingular scarring/ atelectasis is similar compared to prior imaging. No evidence of pneumothorax, new airspace opacity or pulmonary nodule. No acute osseous abnormality. IMPRESSION: 1. Stable chest x-ray without evidence of acute cardiopulmonary process. 2.  Aortic Atherosclerosis (ICD10-170.0) 3. Chronic lingular atelectasis/scarring. Electronically Signed   By: Jacqulynn Cadet  M.D.   On: 11/10/2016 09:55   Ct Head Wo Contrast  Result Date: 11/10/2016 CLINICAL DATA:  Fall today. Right frontotemporal headaches with right neck tenderness. EXAM: CT HEAD WITHOUT CONTRAST CT CERVICAL SPINE WITHOUT CONTRAST TECHNIQUE: Multidetector CT imaging of the head and cervical spine was performed following the standard protocol without intravenous contrast. Multiplanar CT image reconstructions of the cervical spine were also generated. COMPARISON:  CT head 04/20/2016 and 01/02/2016. FINDINGS: CT HEAD FINDINGS Brain: There is no evidence of acute intracranial hemorrhage, mass lesion, brain edema or extra-axial fluid collection. Stable atrophy with disproportionate dilatation of the lateral ventricles. There is stable chronic periventricular white matter disease and chronic lacunar infarcts in the right thalamus and lentiform nucleus. There is no CT evidence of acute cortical infarction. Vascular: Intracranial vascular calcifications are present. No hyperdense vessels identified. Skull: Negative for fracture or focal lesion. Sinuses/Orbits: The visualized paranasal sinuses and mastoid air cells are clear. No orbital abnormalities are seen. Other: None. CT CERVICAL SPINE FINDINGS Alignment: Normal. Skull base and vertebrae: No evidence of acute cervical spine fracture or traumatic subluxation. There is multilevel spondylosis with asymmetric facet arthropathy on the right. Ossification of the ligamentum nuchae noted. Soft tissues  and spinal canal: No prevertebral fluid or swelling. No visible canal hematoma. Bilateral carotid atherosclerosis. Disc levels: No large disc herniation or spinal canal compromise identified. There is multilevel facet arthropathy with probable ankylosis of the right C3-4 facet joint. No high-grade foraminal narrowing. Upper chest: The visualized lung apices are clear. There is atherosclerosis of the great vessels. Other: None. IMPRESSION: 1. Stable head CT demonstrating atrophy with disproportionate ventricular dilatation and chronic periventricular white matter disease. No acute intracranial findings. 2. No evidence of acute cervical spine fracture, traumatic subluxation or static signs of instability. Multilevel cervical spondylosis. 3. Severe atherosclerosis. Electronically Signed   By: Richardean Sale M.D.   On: 11/10/2016 10:30   Ct Cervical Spine Wo Contrast  Result Date: 11/10/2016 CLINICAL DATA:  Fall today. Right frontotemporal headaches with right neck tenderness. EXAM: CT HEAD WITHOUT CONTRAST CT CERVICAL SPINE WITHOUT CONTRAST TECHNIQUE: Multidetector CT imaging of the head and cervical spine was performed following the standard protocol without intravenous contrast. Multiplanar CT image reconstructions of the cervical spine were also generated. COMPARISON:  CT head 04/20/2016 and 01/02/2016. FINDINGS: CT HEAD FINDINGS Brain: There is no evidence of acute intracranial hemorrhage, mass lesion, brain edema or extra-axial fluid collection. Stable atrophy with disproportionate dilatation of the lateral ventricles. There is stable chronic periventricular white matter disease and chronic lacunar infarcts in the right thalamus and lentiform nucleus. There is no CT evidence of acute cortical infarction. Vascular: Intracranial vascular calcifications are present. No hyperdense vessels identified. Skull: Negative for fracture or focal lesion. Sinuses/Orbits: The visualized paranasal sinuses and mastoid air  cells are clear. No orbital abnormalities are seen. Other: None. CT CERVICAL SPINE FINDINGS Alignment: Normal. Skull base and vertebrae: No evidence of acute cervical spine fracture or traumatic subluxation. There is multilevel spondylosis with asymmetric facet arthropathy on the right. Ossification of the ligamentum nuchae noted. Soft tissues and spinal canal: No prevertebral fluid or swelling. No visible canal hematoma. Bilateral carotid atherosclerosis. Disc levels: No large disc herniation or spinal canal compromise identified. There is multilevel facet arthropathy with probable ankylosis of the right C3-4 facet joint. No high-grade foraminal narrowing. Upper chest: The visualized lung apices are clear. There is atherosclerosis of the great vessels. Other: None. IMPRESSION: 1. Stable head CT demonstrating atrophy with disproportionate ventricular dilatation  and chronic periventricular white matter disease. No acute intracranial findings. 2. No evidence of acute cervical spine fracture, traumatic subluxation or static signs of instability. Multilevel cervical spondylosis. 3. Severe atherosclerosis. Electronically Signed   By: Richardean Sale M.D.   On: 11/10/2016 10:30   Dg Hip Unilat With Pelvis 2-3 Views Right  Result Date: 11/10/2016 CLINICAL DATA:  Status post fall in the 9 hall today. Persistent right anterior hip pain made worse with movement. EXAM: DG HIP (WITH OR WITHOUT PELVIS) 2-3V RIGHT COMPARISON:  Coronal and sagittal images from an abdominal and pelvic CT scan dated April 27, 2016 FINDINGS: There is an acute impacted fracture of the subcapital region of the right hip. The femoral head remains normally related to the right hip joint. The joint space is well maintained. The intertrochanteric and sub trochanteric regions are normal. The observed bony pelvis exhibits no acute fracture. There is diffuse osteopenia. IMPRESSION: There is an acute impacted subcapital fracture of the right hip.  Electronically Signed   By: David  Martinique M.D.   On: 11/10/2016 09:53    Procedures Procedures (including critical care time)  Medications Ordered in ED Medications  fentaNYL (SUBLIMAZE) injection 50 mcg (50 mcg Intravenous Given 11/10/16 0902)  HYDROcodone-acetaminophen (NORCO/VICODIN) 5-325 MG per tablet 1-2 tablet (1 tablet Oral Given 11/10/16 0856)  0.9 %  sodium chloride infusion (100 mL/hr Intravenous New Bag/Given 11/10/16 0902)     Initial Impression / Assessment and Plan / ED Course  I have reviewed the triage vital signs and the nursing notes.  Pertinent labs & imaging results that were available during my care of the patient were reviewed by me and considered in my medical decision making (see chart for details).  Clinical Course     I discussed patient's case with our orthopedic colleagues, and given evidence for fracture she will require admission for surgical repair. On repeat exam the patient remains in similar condition, complaining of hip pain. No other new complaint sprayed   Final Clinical Impressions(s) / ED Diagnoses  Hip fracture, right   Carmin Muskrat, MD 11/10/16 1058

## 2016-11-10 NOTE — ED Notes (Signed)
Pt denies pain at present. Pt also cleaned of modrate amount of stool.

## 2016-11-10 NOTE — Transfer of Care (Signed)
Immediate Anesthesia Transfer of Care Note  Patient: [name]  Procedure(s) Performed: Procedure(s): ARTHROPLASTY BIPOLAR HIP (HEMIARTHROPLASTY) (Right)  Patient Location: PACU  Anesthesia Type:General  Level of Consciousness: awake, alert  and patient cooperative  Airway & Oxygen Therapy: Patient Spontanous Breathing and Patient connected to nasal cannula oxygen  Post-op Assessment: Report given to RN and Post -op Vital signs reviewed and stable  Post vital signs: Reviewed and stable  Last Vitals:  Vitals:   11/10/16 1653 11/10/16 2128  BP: (!) 152/68   Pulse: 93 94  Resp: 18 12  Temp: 36.7 C 36.4 C    Last Pain:  Vitals:   11/10/16 1653  TempSrc: Oral  PainSc:       Patients Stated Pain Goal: Other (Comment) (Unable to tell) ([id number])  Complications: No apparent anesthesia complications

## 2016-11-10 NOTE — H&P (Signed)
History and Physical    [name] [id number] DOB: May 18, 1941 DOA: 11/10/2016   PCP: Vidal Schwalbe, MD   Patient coming from/Resides with: [facility]  Admission status: Inpatient/TELEMETRY-medically necessary to stay a minimum 2 midnights to rule out impending and/or unexpected changes in physiologic status that may differ from initial evaluation performed in the ER and/or at time of admission. Mechanical fall sustaining a hip fracture that will require operative intervention this admission. Initially will be NPO and require IV fluids and IV pain medications. Will require postoperative PT/OT evaluation and pending findings likely will need SNF placement. Has underlying history of CAD and will require pre-and postoperative telemetry monitoring as well.  Chief Complaint: Mechanical fall with hip pain  HPI: [name] is a 75 y.o. female with medical history significant for COPD and tobacco abuse, chronic systolic heart failure, known CAD with last cardiac catheterization 2016 demonstrating patent stent, and fibromyalgia. Patient with mechanical fall and dining hall facility today without loss of consciousness. Reported right hip pain also sustained small contusion to right posterior head. Evaluation in the ER revealed hip fracture. Has been evaluated by orthopedic team with plans for operative intervention on date of admission after medical clearance from internal medicine team.  ED Course:  Vital Signs: BP 146/77 (BP Location: Right Arm)   Pulse 86   Temp 97.5 F (36.4 C) (Oral)   Resp 16   SpO2 100%  DG right hip: Acute, impacted subcapital fracture of the right hip PCXR: Stable without evidence of acute cardiopulmonary process CT head: Atrophy with disproportionate ventricular dilatation and chronic periventricular white matter disease without acute intracranial findings CT cervical spine: No evidence of acute cervical spine fracture, traumatic subluxation or static  signs of instability Lab data: Sodium 138, potassium 4.2, chloride 101, BUN 17, creatinine 0.93, glucose 164, anion gap 9, white count 13,000 with neutrophils 79 % and absolute neutrophils 10.3%, hemoglobin 13.8, platelets 198,000, coags normal, type and screen has been completed Medications and treatments: Fentanyl 50 g IV 1, Vicodin 5-3 251, normal saline at 100 per hour  Review of Systems:  In addition to the HPI above,  **History limited by patient's dementia; discussed with patient's daughter over the phone and to the best of knowledge all review of system categories are negative except for the acute hip pain as previously documented No Fever-chills, myalgias or other constitutional symptoms No Headache, changes with Vision or hearing, new weakness, tingling, numbness in any extremity, dizziness, dysarthria or word finding difficulty, gait disturbance or imbalance, tremors or seizure activity No problems swallowing food or Liquids, indigestion/reflux, choking or coughing while eating, abdominal pain with or after eating No Chest pain, Cough or Shortness of Breath, palpitations, orthopnea or DOE No Abdominal pain, N/V, melena,hematochezia, dark tarry stools, constipation No dysuria, malodorous urine, hematuria or flank pain No new skin rashes, lesions, masses or bruises, No new joint redness No recent unintentional weight gain or loss No polyuria, polydypsia or polyphagia   Past Medical History:  Diagnosis Date  . Anxiety   . CAD (coronary artery disease)    a. s/p inferior STEMI 12/29/10 with rotablator atherectomy RCA 12/31/10 and DES x 2 RCA;  b. Lexiscan Myoview (02/2015): mild reversible apical anterior perfusion defect. C. cath 02/2015 50% LAD lesion with patent stent, Tx Rx    . COPD (chronic obstructive pulmonary disease) ([place])   . Dementia   . Diabetes mellitus    type 2  . Diverticulosis   . GERD (gastroesophageal  reflux disease)   . Glaucoma   . HTN (hypertension)   .  Hyperlipidemia   . Ischemic cardiomyopathy    EF 45%cath 2012 EF55% 5/12; 35% 11/14  . Memory deficit   . Nephrolithiasis   . Osteoarthritis   . Overweight(278.02)   . Respiratory arrest//ACE Inhibitor presumed cause   . Sleep apnea   . Small bowel obstruction    from a sigmoid stricture    Past Surgical History:  Procedure Laterality Date  . bilateral tubal ligation    . CARDIAC CATHETERIZATION N/A 03/18/2015   Procedure: LEFT HEART CATH AND CORONARY ANGIOGRAPHY;  Surgeon: Lorretta Harp, MD;  Location: [facility] CATH LAB;  Service: Cardiovascular;  Laterality: N/A;  . CARDIAC SURGERY    . 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 LITHOTRIPSY      Social History   Social History  . Marital status: Widowed    Spouse name: N/A  . Number of children: 2  . Years of education: 11   Occupational History  . Retired     Haematologist    Social History Main Topics  . Smoking status: Current Every Day Smoker    Packs/day: 1.00    Years: 25.00    Types: Cigarettes  . Smokeless tobacco: Never Used     Comment: over a ppd for 40+ years; quit 10/2004.  unsuccessfully tried eCigs.  . Alcohol use No  . Drug use: No  . Sexual activity: No   Other Topics Concern  . Not on file   Social History Narrative   Patient is widowed with 2 children.   Patient is right handed.   Patient has 11 th grade education.   Patient drinks 6-7 daily-Now abstinent/resides at assisted living facility     Mobility: Unclear-suspect mobilizes independently although likely utilizes a walker Work history: Not obtained   Allergies  Allergen Reactions  . Lisinopril Other (See Comments)    Reaction:  Respiratory arrest   . Spironolactone Anaphylaxis  . Vancomycin Anaphylaxis and Rash  . Donepezil Diarrhea  . Eggs Or Egg-Derived Products Diarrhea  . Erythromycin Other (See Comments)    Reaction:  Makes pt feel weird   . Macrolides And Ketolides Other (See  Comments)    Reaction:  Unknown   . Penicillins Itching and Other (See Comments)    Has patient had a PCN reaction causing immediate rash, facial/tongue/throat swelling, SOB or lightheadedness with hypotension: No Has patient had a PCN reaction causing severe rash involving mucus membranes or skin necrosis: No Has patient had a PCN reaction that required hospitalization No Has patient had a PCN reaction occurring within the last 10 years: No If all of the above answers are "NO", then may proceed with Cephalosporin use.  . Quinapril Hcl Itching  . Angiotensin Receptor Blockers Other (See Comments)    Reaction:  Unknown   . Lipitor [Atorvastatin] Palpitations  . Pentazocine Lactate Other (See Comments)    Reaction:  Unknown   . Propoxyphene Hcl Other (See Comments)    Reaction unknown  . Sulfonamide Derivatives Other (See Comments)    Reaction:  Unknown     Family History  Problem Relation Age of Onset  . Diabetes Father   . Heart disease Father   . Heart attack Father   . Stroke Mother   . Breast cancer Maternal Grandmother   . Diabetes Sister   . Hypertension Sister   . Colon cancer Neg Hx  Prior to Admission medications   Medication Sig Start Date End Date Taking? Authorizing Provider  albuterol (ACCUNEB) 0.63 MG/3ML nebulizer solution Take 1 ampule by nebulization daily.   Yes Historical Provider, MD  albuterol (PROVENTIL HFA;VENTOLIN HFA) 108 (90 BASE) MCG/ACT inhaler Inhale 2 puffs into the lungs every 6 (six) hours as needed for wheezing or shortness of breath.    Yes Historical Provider, MD  aspirin EC 81 MG tablet Take 81 mg by mouth daily.   Yes Historical Provider, MD  atorvastatin (LIPITOR) 40 MG tablet Take 40 mg by mouth daily.   Yes Historical Provider, MD  clopidogrel (PLAVIX) 75 MG tablet Take 75 mg by mouth daily.   Yes Historical Provider, MD  furosemide (LASIX) 20 MG tablet Take 20 mg by mouth daily.    Yes Historical Provider, MD  insulin glargine  (LANTUS) 100 unit/mL SOPN Inject 17 Units into the skin at bedtime.    Yes Historical Provider, MD  insulin lispro (HUMALOG KWIKPEN) 100 UNIT/ML KiwkPen Inject 4 Units into the skin 3 (three) times daily with meals.   Yes Historical Provider, MD  ipratropium (ATROVENT) 0.02 % nebulizer solution Take 0.5 mg by nebulization daily.   Yes Historical Provider, MD  isosorbide mononitrate (IMDUR) 30 MG 24 hr tablet Take 0.5 tablets (15 mg total) by mouth daily. 10/25/15  Yes Isaiah Serge, NP  LORazepam (ATIVAN) 0.5 MG tablet Take 1 tablet (0.5 mg total) by mouth 2 (two) times daily as needed for anxiety. 01/04/16  Yes Shanker Kristeen Mans, MD  memantine (NAMENDA) 10 MG tablet Take 10 mg by mouth 2 (two) times daily.   Yes Historical Provider, MD  metFORMIN (GLUCOPHAGE) 1000 MG tablet Take 1,000 mg by mouth 2 (two) times daily with a meal.    Yes Historical Provider, MD  metoprolol succinate (TOPROL XL) 25 MG 24 hr tablet Take 0.5 tablets (12.5 mg total) by mouth daily. 02/05/16  Yes Donne Hazel, MD  mirtazapine (REMERON SOL-TAB) 15 MG disintegrating tablet Take 1 tablet (15 mg total) by mouth at bedtime. 01/04/16  Yes Shanker Kristeen Mans, MD  mometasone-formoterol (DULERA) 100-5 MCG/ACT AERO Inhale 2 puffs into the lungs 2 (two) times daily.   Yes Historical Provider, MD  nitroGLYCERIN (NITROSTAT) 0.4 MG SL tablet Place 0.4 mg under the tongue every 5 (five) minutes as needed for chest pain.   Yes Historical Provider, MD  omeprazole (PRILOSEC) 20 MG capsule Take 20 mg by mouth daily before breakfast.    Yes Historical Provider, MD  oxyCODONE-acetaminophen (PERCOCET/ROXICET) 5-325 MG tablet Take 1 tablet by mouth every 6 (six) hours as needed for moderate pain or severe pain. Patient not taking: Reported on 11/10/2016 01/04/16   Jonetta Osgood, MD    Physical Exam: Vitals:   11/10/16 0822 11/10/16 0825  BP:  146/77  Pulse:  86  Resp:  16  Temp:  97.5 F (36.4 C)  TempSrc:  Oral  SpO2: 93% 100%       Constitutional: NAD, calm, uncomfortable secondary to right hip pain Eyes: PERRL, lids and conjunctivae normal ENMT: Mucous membranes are dry. Posterior pharynx clear of any exudate or lesions. Fair dentition.  Neck: normal, supple, no masses, no thyromegaly Respiratory: clear to auscultation bilaterally, no wheezing, no crackles. Normal respiratory effort. No accessory muscle use.  Cardiovascular: Regular rate and rhythm, no murmurs / rubs / gallops. No extremity edema. 2+ pedal pulses. No carotid bruits.  Abdomen: no tenderness, no masses palpated. No hepatosplenomegaly. Bowel sounds positive.  Musculoskeletal: no clubbing / cyanosis. No joint deformity upper extremities. Subtle internal rotation and right hip and tender over right hip Good ROM, no contractures. Normal muscle tone.  Skin: no rashes, lesions, ulcers. No induration Neurologic: CN 2-12 grossly intact. Sensation intact, DTR normal. Strength 5/5 x all 4 extremities.  Psychiatric: Alert and oriented x name and place. Anxious mood with flat affect. Clear short-term memory deficits. Had to repeat several times and rationale why patient was unable to have diet at this juncture i.e. impending surgery   Labs on Admission: I have personally reviewed following labs and imaging studies  CBC:  Recent Labs Lab 11/10/16 0920  WBC 13.0*  NEUTROABS 10.3*  HGB 13.8  HCT 41.0  MCV 98.1  PLT 99991111   Basic Metabolic Panel:  Recent Labs Lab 11/10/16 0920  NA 138  K 4.2  CL 101  CO2 28  GLUCOSE 164*  BUN 17  CREATININE 0.93  CALCIUM 9.5   GFR: CrCl cannot be calculated (Unknown ideal weight.). Liver Function Tests: No results for input(s): AST, ALT, ALKPHOS, BILITOT, PROT, ALBUMIN in the last 168 hours. No results for input(s): LIPASE, AMYLASE in the last 168 hours. No results for input(s): AMMONIA in the last 168 hours. Coagulation Profile:  Recent Labs Lab 11/10/16 0920  INR 1.04   Cardiac Enzymes: No  results for input(s): CKTOTAL, CKMB, CKMBINDEX, TROPONINI in the last 168 hours. BNP (last 3 results) No results for input(s): PROBNP in the last 8760 hours. HbA1C: No results for input(s): HGBA1C in the last 72 hours. CBG: No results for input(s): GLUCAP in the last 168 hours. Lipid Profile: No results for input(s): CHOL, HDL, LDLCALC, TRIG, CHOLHDL, LDLDIRECT in the last 72 hours. Thyroid Function Tests: No results for input(s): TSH, T4TOTAL, FREET4, T3FREE, THYROIDAB in the last 72 hours. Anemia Panel: No results for input(s): VITAMINB12, FOLATE, FERRITIN, TIBC, IRON, RETICCTPCT in the last 72 hours. Urine analysis:    Component Value Date/Time   COLORURINE YELLOW 04/27/2016 1936   APPEARANCEUR CLEAR 04/27/2016 1936   LABSPEC 1.019 04/27/2016 1936   PHURINE 6.0 04/27/2016 1936   GLUCOSEU NEGATIVE 04/27/2016 1936   HGBUR NEGATIVE 04/27/2016 1936   BILIRUBINUR NEGATIVE 04/27/2016 1936   KETONESUR NEGATIVE 04/27/2016 1936   PROTEINUR NEGATIVE 04/27/2016 1936   UROBILINOGEN 0.2 01/23/2014 2343   NITRITE NEGATIVE 04/27/2016 1936   LEUKOCYTESUR TRACE (A) 04/27/2016 1936   Sepsis Labs: @LABRCNTIP (procalcitonin:4,lacticidven:4) )No results found for this or any previous visit (from the past 240 hour(s)).   Radiological Exams on Admission: Dg Chest 1 View  Result Date: 11/10/2016 CLINICAL DATA:  75 year old female status post mechanical fall earlier today. Right hip pain. EXAM: CHEST 1 VIEW COMPARISON:  Prior chest x-ray 07/03/2016 FINDINGS: Stable mild cardiomegaly. Mediastinal contours remain within normal limits. Atherosclerotic calcifications again noted in the transverse aorta. Chronic lingular scarring/ atelectasis is similar compared to prior imaging. No evidence of pneumothorax, new airspace opacity or pulmonary nodule. No acute osseous abnormality. IMPRESSION: 1. Stable chest x-ray without evidence of acute cardiopulmonary process. 2.  Aortic Atherosclerosis (ICD10-170.0) 3.  Chronic lingular atelectasis/scarring. Electronically Signed   By: Jacqulynn Cadet M.D.   On: 11/10/2016 09:55   Ct Head Wo Contrast  Result Date: 11/10/2016 CLINICAL DATA:  Fall today. Right frontotemporal headaches with right neck tenderness. EXAM: CT HEAD WITHOUT CONTRAST CT CERVICAL SPINE WITHOUT CONTRAST TECHNIQUE: Multidetector CT imaging of the head and cervical spine was performed following the standard protocol without intravenous contrast. Multiplanar CT image reconstructions of  the cervical spine were also generated. COMPARISON:  CT head 04/20/2016 and 01/02/2016. FINDINGS: CT HEAD FINDINGS Brain: There is no evidence of acute intracranial hemorrhage, mass lesion, brain edema or extra-axial fluid collection. Stable atrophy with disproportionate dilatation of the lateral ventricles. There is stable chronic periventricular white matter disease and chronic lacunar infarcts in the right thalamus and lentiform nucleus. There is no CT evidence of acute cortical infarction. Vascular: Intracranial vascular calcifications are present. No hyperdense vessels identified. Skull: Negative for fracture or focal lesion. Sinuses/Orbits: The visualized paranasal sinuses and mastoid air cells are clear. No orbital abnormalities are seen. Other: None. CT CERVICAL SPINE FINDINGS Alignment: Normal. Skull base and vertebrae: No evidence of acute cervical spine fracture or traumatic subluxation. There is multilevel spondylosis with asymmetric facet arthropathy on the right. Ossification of the ligamentum nuchae noted. Soft tissues and spinal canal: No prevertebral fluid or swelling. No visible canal hematoma. Bilateral carotid atherosclerosis. Disc levels: No large disc herniation or spinal canal compromise identified. There is multilevel facet arthropathy with probable ankylosis of the right C3-4 facet joint. No high-grade foraminal narrowing. Upper chest: The visualized lung apices are clear. There is atherosclerosis of  the great vessels. Other: None. IMPRESSION: 1. Stable head CT demonstrating atrophy with disproportionate ventricular dilatation and chronic periventricular white matter disease. No acute intracranial findings. 2. No evidence of acute cervical spine fracture, traumatic subluxation or static signs of instability. Multilevel cervical spondylosis. 3. Severe atherosclerosis. Electronically Signed   By: Richardean Sale M.D.   On: 11/10/2016 10:30   Ct Cervical Spine Wo Contrast  Result Date: 11/10/2016 CLINICAL DATA:  Fall today. Right frontotemporal headaches with right neck tenderness. EXAM: CT HEAD WITHOUT CONTRAST CT CERVICAL SPINE WITHOUT CONTRAST TECHNIQUE: Multidetector CT imaging of the head and cervical spine was performed following the standard protocol without intravenous contrast. Multiplanar CT image reconstructions of the cervical spine were also generated. COMPARISON:  CT head 04/20/2016 and 01/02/2016. FINDINGS: CT HEAD FINDINGS Brain: There is no evidence of acute intracranial hemorrhage, mass lesion, brain edema or extra-axial fluid collection. Stable atrophy with disproportionate dilatation of the lateral ventricles. There is stable chronic periventricular white matter disease and chronic lacunar infarcts in the right thalamus and lentiform nucleus. There is no CT evidence of acute cortical infarction. Vascular: Intracranial vascular calcifications are present. No hyperdense vessels identified. Skull: Negative for fracture or focal lesion. Sinuses/Orbits: The visualized paranasal sinuses and mastoid air cells are clear. No orbital abnormalities are seen. Other: None. CT CERVICAL SPINE FINDINGS Alignment: Normal. Skull base and vertebrae: No evidence of acute cervical spine fracture or traumatic subluxation. There is multilevel spondylosis with asymmetric facet arthropathy on the right. Ossification of the ligamentum nuchae noted. Soft tissues and spinal canal: No prevertebral fluid or swelling. No  visible canal hematoma. Bilateral carotid atherosclerosis. Disc levels: No large disc herniation or spinal canal compromise identified. There is multilevel facet arthropathy with probable ankylosis of the right C3-4 facet joint. No high-grade foraminal narrowing. Upper chest: The visualized lung apices are clear. There is atherosclerosis of the great vessels. Other: None. IMPRESSION: 1. Stable head CT demonstrating atrophy with disproportionate ventricular dilatation and chronic periventricular white matter disease. No acute intracranial findings. 2. No evidence of acute cervical spine fracture, traumatic subluxation or static signs of instability. Multilevel cervical spondylosis. 3. Severe atherosclerosis. Electronically Signed   By: Richardean Sale M.D.   On: 11/10/2016 10:30   Dg Hip Unilat With Pelvis 2-3 Views Right  Result Date: 11/10/2016 CLINICAL DATA:  Status post fall in the 9 hall today. Persistent right anterior hip pain made worse with movement. EXAM: DG HIP (WITH OR WITHOUT PELVIS) 2-3V RIGHT COMPARISON:  Coronal and sagittal images from an abdominal and pelvic CT scan dated April 27, 2016 FINDINGS: There is an acute impacted fracture of the subcapital region of the right hip. The femoral head remains normally related to the right hip joint. The joint space is well maintained. The intertrochanteric and sub trochanteric regions are normal. The observed bony pelvis exhibits no acute fracture. There is diffuse osteopenia. IMPRESSION: There is an acute impacted subcapital fracture of the right hip. Electronically Signed   By: David  Martinique M.D.   On: 11/10/2016 09:53    EKG: (Independently reviewed) preoperative EKG ordered but not yet completed at time of admission  Assessment/Plan Principal Problem:   Closed right hip fracture  -Patient presents with acute impacted subcapital fracture of the right hip requiring operative intervention -Routine hip fracture order set initiated -Orthopedic  physician plans operative intervention today-patient's daughter that is power of attorney has been updated; orthopedist also plans on speaking with the same daughter -EDP has ordered Foley catheter to be placed and I have ordered urinalysis with culture -NPO/IVFs -Low-dose IV morphine for pain preoperatively -PT/OT evaluation postoperatively -ALF level of care prior to admission and likely will need SNF at least short-term at time of discharge  Active Problems:   Coronary atherosclerosis of native coronary artery -Underwent Rotablator with drug-eluting stent 2 to RCA in 2012 with catheterization in 2016 demonstrating patent stent -No obvious symptomatology reported by patient (although is unreliable historian) or by daughter when questioned -Obtain preoperative EKG -Echocardiogram March 2017 with normal LV systolic function and mild grade 1 diastolic dysfunction -Holding Plavix for now-likely can resume immediately postoperatively at discretion of orthopedic surgeon-recommendation is to resume as soon as possible -Geriatric-sensitive perioperative CARDIAC RISK = 2.4% probability of perioperative myocardial infarction or cardiac arrest [facility] Perioperative cardiac risk=1.41% estimated risk probability for perioperative myocardial infarction or arrest -Revised cardiac risk index Truman Hayward criteria)= > 11% estimated rate of myocardial infarction, pulmonary edema, ventricular fibrillation, cardiac arrest complete heart block **Patient with moderate to high risk of cardiac event peri or postoperatively -risk of surgery does not outweigh benefits of proceeding with surgery. If patient does not undergo surgery she would be permanently unable to ambulate and would have issues regarding significant chronic pain along with expected comorbidities associated with prolonged immobilization -In addition to recommendations regarding Plavix above, plan to continue low-dose aspirin, Imdur, Lipitor and beta blocker; in  addition she will require telemetry monitoring pre-, peri-and postoperatively    DM2 (diabetes mellitus, type 2)  -Follow CBGs every 4 hours while NPO -Continue preadmission Lantus at HS -Hold metformin -SSI    Essential hypertension, benign -Current blood pressure controlled -Continue Toprol    Chronic systolic heart failure /Grade 1 Diastolic dysfunction -Most recent echocardiogram demonstrates preserved LV function but patient does have mild grade 1 diastolic dysfunction which is compensated -NPO and on IV fluids -Lasix on hold preoperatively-once alert and tolerating diet can resume    COPD (chronic obstructive pulmonary disease)  -Compensated without wheezing -Continue preadmission MDIs    Fibromyalgia -Preadmission was on low-dose Percocet every 6 hours PRN    Dyslipidemia -Continue Lipitor    Senile dementia without behavioral disturbance -Continue Namenda and Remeron -Monitor for acute delirium      DVT prophylaxis: SCDs with initiation of pharmacological DVT prophylaxis at discretion of orthopedic surgeon Code Status: DO  NOT RESUSCITATE Family Communication: Patient's daughter/POA by telephone Disposition Plan: Anticipate discharge to skilled level of care pending postoperative PT/OT evaluation Consults called: Orthopedics/[place]     Carrington Olazabal L. ANP-BC [facility] Pager [phone]   If 7PM-7AM, please contact night-coverage [email or web address] Password [email or web address]  11/10/2016, 11:08 AM

## 2016-11-10 NOTE — ED Triage Notes (Signed)
Pt presents by EMS from [facility]. Pt. Had mechanical fall in dining hall today. Pt. Denies LOC. Denies neck/back pain. States pain to R anterior hip, worse with movement. Pt has small knot to R posterior head, on plavix. Pt. With hx of dementia, at baseline per staff.

## 2016-11-10 NOTE — Consult Note (Signed)
ORTHOPAEDIC CONSULTATION  REQUESTING PHYSICIAN: No att. providers found  [place]  Chief Complaint: right hip pain  HPI: [name] is a 75 y.o. female who complains of  Right hip pain after a fall this morning.  She has advanced dementia and doesn't remember much about the fall.  Pain severe located over right hip worse with movement.  Mechanical fall by report.  Normally walks, but is progressively losing function according to family.   Past Medical History:  Diagnosis Date  . Anxiety   . CAD (coronary artery disease)    a. s/p inferior STEMI 12/29/10 with rotablator atherectomy RCA 12/31/10 and DES x 2 RCA;  b. Lexiscan Myoview (02/2015): mild reversible apical anterior perfusion defect. C. cath 02/2015 50% LAD lesion with patent stent, Tx Rx    . COPD (chronic obstructive pulmonary disease) ([place])   . Dementia   . Diabetes mellitus    type 2  . Diverticulosis   . GERD (gastroesophageal reflux disease)   . Glaucoma   . HTN (hypertension)   . Hyperlipidemia   . Ischemic cardiomyopathy    EF 45%cath 2012 EF55% 5/12; 35% 11/14  . Memory deficit   . Nephrolithiasis   . Osteoarthritis   . Overweight(278.02)   . Respiratory arrest//ACE Inhibitor presumed cause   . Sleep apnea   . Small bowel obstruction    from a sigmoid stricture   Past Surgical History:  Procedure Laterality Date  . bilateral tubal ligation    . CARDIAC CATHETERIZATION N/A 03/18/2015   Procedure: LEFT HEART CATH AND CORONARY ANGIOGRAPHY;  Surgeon: Lorretta Harp, MD;  Location: [facility] CATH LAB;  Service: Cardiovascular;  Laterality: N/A;  . CARDIAC SURGERY    . 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 LITHOTRIPSY     Social History   Social History  . Marital status: Widowed    Spouse name: N/A  . Number of children: 2  . Years of education: 11   Occupational History  . Retired     Haematologist    Social History Main Topics  . Smoking status: Current  Every Day Smoker    Packs/day: 1.00    Years: 25.00    Types: Cigarettes  . Smokeless tobacco: Never Used     Comment: over a ppd for 40+ years; quit 10/2004.  unsuccessfully tried eCigs.  . Alcohol use No  . Drug use: No  . Sexual activity: No   Other Topics Concern  . None   Social History Narrative   Patient is widowed with 2 children.   Patient is right handed.   Patient has 11 th grade education.   Patient drinks 6-7 daily.   Family History  Problem Relation Age of Onset  . Diabetes Father   . Heart disease Father   . Heart attack Father   . Stroke Mother   . Breast cancer Maternal Grandmother   . Diabetes Sister   . Hypertension Sister   . Colon cancer Neg Hx    Allergies  Allergen Reactions  . Lisinopril Other (See Comments)    Reaction:  Respiratory arrest   . Spironolactone Anaphylaxis  . Vancomycin Anaphylaxis and Rash  . Donepezil Diarrhea  . Eggs Or Egg-Derived Products Diarrhea  . Erythromycin Other (See Comments)    Reaction:  Makes pt feel weird   . Macrolides And Ketolides Other (See Comments)    Reaction:  Unknown   . Penicillins Itching and Other (  See Comments)    Has patient had a PCN reaction causing immediate rash, facial/tongue/throat swelling, SOB or lightheadedness with hypotension: No Has patient had a PCN reaction causing severe rash involving mucus membranes or skin necrosis: No Has patient had a PCN reaction that required hospitalization No Has patient had a PCN reaction occurring within the last 10 years: No If all of the above answers are "NO", then may proceed with Cephalosporin use.  . Quinapril Hcl Itching  . Angiotensin Receptor Blockers Other (See Comments)    Reaction:  Unknown   . Lipitor [Atorvastatin] Palpitations  . Pentazocine Lactate Other (See Comments)    Reaction:  Unknown   . Propoxyphene Hcl Other (See Comments)    Reaction unknown  . Sulfonamide Derivatives Other (See Comments)    Reaction:  Unknown       Positive ROS: All other systems have been reviewed and were otherwise negative with the exception of those mentioned in the HPI and as above.  Physical Exam: General: Alert, no acute distress Cardiovascular: Mild bilateral pedal edema Respiratory: No cyanosis, no use of accessory musculature GI: No organomegaly, abdomen is soft and non-tender Skin: No lesions in the area of chief complaint except for bruising over right hip. Neurologic: Sensation intact distally Psychiatric: Patient is not competent for consent, but does interact appropriately with me, but is somewhat lethargic. Lymphatic: No axillary or cervical lymphadenopathy  MUSCULOSKELETAL: right hip is shortened and externally rotated with severe pain with PROM.  EHL intact.  Assessment: Principal Problem:   Closed right hip fracture (HCC) Active Problems:   Fibromyalgia   DM2 (diabetes mellitus, type 2) (HCC)   Essential hypertension, benign   Dyslipidemia   Coronary atherosclerosis of native coronary artery   Chronic systolic heart failure (HCC)   COPD (chronic obstructive pulmonary disease) (HCC)   Senile dementia without behavioral disturbance    Plan: The risks benefits and alternatives were discussed with the patient and daughter (DPOA) including but not limited to the risks of nonoperative treatment, versus surgical intervention including infection, bleeding, nerve injury, periprosthetic fracture, the need for revision surgery, dislocation, leg length discrepancy, blood clots, cardiopulmonary complications, morbidity, mortality, among others, and they were willing to proceed.    Plan for surgery later today, right hip hemiarthroplasty.   NPO.  Await OR availability.  Also discussed potential for blood transfusion.    Kathleen Bridge, MD Cell [phone]   11/10/2016 12:37 PM

## 2016-11-10 NOTE — Anesthesia Postprocedure Evaluation (Signed)
Anesthesia Post Note  Patient: Phiona Olliver Nelligan  Procedure(s) Performed: Procedure(s) (LRB): ARTHROPLASTY BIPOLAR HIP (HEMIARTHROPLASTY) (Right)  Patient location during evaluation: PACU Anesthesia Type: General Level of consciousness: awake and alert Pain management: pain level controlled Vital Signs Assessment: post-procedure vital signs reviewed and stable Respiratory status: spontaneous breathing, nonlabored ventilation, respiratory function stable and patient connected to nasal cannula oxygen Cardiovascular status: blood pressure returned to baseline and stable Postop Assessment: no signs of nausea or vomiting Anesthetic complications: no       Last Vitals:  Vitals:   11/10/16 2145 11/10/16 2212  BP: (!) 116/56   Pulse: 90 94  Resp: (!) 26 (!) 24  Temp:  36.7 C    Last Pain:  Vitals:   11/10/16 1653  TempSrc: Oral  PainSc:                  Rachal Dvorsky,W. EDMOND

## 2016-11-10 NOTE — Anesthesia Procedure Notes (Signed)
Procedure Name: Intubation Date/Time: 11/10/2016 7:25 PM Performed by: Manuela Schwartz B Pre-anesthesia Checklist: Patient identified, Emergency Drugs available, Suction available, Patient being monitored and Timeout performed Patient Re-evaluated:Patient Re-evaluated prior to inductionOxygen Delivery Method: Circle system utilized Preoxygenation: Pre-oxygenation with 100% oxygen Intubation Type: IV induction Ventilation: Mask ventilation without difficulty Laryngoscope Size: Mac and 3 Grade View: Grade I Tube type: Oral Tube size: 7.5 mm Number of attempts: 1 Airway Equipment and Method: Stylet Placement Confirmation: ETT inserted through vocal cords under direct vision,  positive ETCO2 and breath sounds checked- equal and bilateral Secured at: 21 cm Tube secured with: Tape Dental Injury: Teeth and Oropharynx as per pre-operative assessment

## 2016-11-10 NOTE — Op Note (Signed)
11/10/2016  9:07 PM  PATIENT:  [name]   MRN: [id number]  PRE-OPERATIVE DIAGNOSIS:  RIGHT HIP FRACTURE.  POST-OPERATIVE DIAGNOSIS:  Right Hip Fracture  PROCEDURE:  Procedure(s): ARTHROPLASTY BIPOLAR HIP (HEMIARTHROPLASTY)  PREOPERATIVE INDICATIONS:  [name] is an 75 y.o. female who was admitted 11/10/2016 with a diagnosis of Closed right hip fracture ([place]) and elected for surgical management.  The risks benefits and alternatives were discussed with the patient and her DPOA daughter [name] including but not limited to the risks of nonoperative treatment, versus surgical intervention including infection, bleeding, nerve injury, periprosthetic fracture, the need for revision surgery, dislocation, leg length discrepancy, blood clots, cardiopulmonary complications, morbidity, mortality, among others, and they were willing to proceed.  Predicted outcome is good, although there will be at least a six to nine month expected recovery.   OPERATIVE REPORT     SURGEON:  Marchia Bond, MD    ASSISTANT:  Joya Gaskins, OPA-C  (Present throughout the entire procedure,  necessary for completion of procedure in a timely manner, assisting with retraction, instrumentation, and closure)     ANESTHESIA:  General    COMPLICATIONS:  None.      COMPONENTS:  Chemical engineer Femoral Fracture stem size 2, with a -3 spacer and a size 42 fracture head unipolar hip ball.    PROCEDURE IN DETAIL: The patient was met in the holding area and identified.  The appropriate hip  was marked at the operative site. The patient was then transported to the OR and  placed under general anesthesia.  At that point, the patient was  placed in the lateral decubitus position with the operative side up and  secured to the operating room table and all bony prominences padded.     The operative lower extremity was prepped from the iliac crest to the toes.  Sterile draping was performed.  Time out was  performed prior to incision.      A routine posterolateral approach was utilized via sharp dissection  carried down to the subcutaneous tissue.  Gross bleeders were Bovie  coagulated.  The iliotibial band was identified and incised  along the length of the skin incision.  Self-retaining retractors were  inserted.  With the hip internally rotated, the short external rotators  were identified. The piriformis was tagged with FiberWire, and the hip capsule released in a T-type fashion.  The femoral neck was exposed, and I resected the femoral neck using the appropriate jig. This was performed at approximately a thumb's breadth above the lesser trochanter.    I then exposed the deep acetabulum, cleared out any tissue including the ligamentum teres, and included the hip capsule in the FiberWire used above and below the T.    I then prepared the proximal femur using the cookie-cutter, the lateralizing reamer, and then sequentially broached. The canal was extremely tight, in fact I could not use the lateralizing reamer initially because distally it was too narrow within the canal. After using the canal finder effectively as a distal reamer, and using a curet to lateralize, I was able to get the size 1 broach down, although it was extremely tight.  I went back with the lateralizer, and was able to then yet the size 2 broach down.   A trial utilized, and I reduced the hip and it was found to  be somewhat long, although the stability was excellent. I felt like the soft tissue tension was inappropriate, and the leg length too  long, and so I removed the broach, and used the saw to resect an additional millimeter or so of femoral neck.  I then went back with the size 2, and it sat down nicely, and I trialed again, and it was found to have appropriate soft tissue tension, and to have excellent stability with functional range of motion. The trial components were then removed. Interestingly, the size 2 had a slight bit  of toggle after the trialing, but given the extra millimeter of press-fit with the prosthesis grit blasted coating, I elected to proceed with a size 2.  I then place the real implant and I impacted the real head ball into place.  during the final impaction of the prosthesis I saw a very small crack in the very most proximal aspect of the calcar well proximal to the lesser trochanter. This was not structurally significant, and the femur appeared stable. I considered placing a cable, however the prosthesis appeared quite stable.  The hip was then reduced and taken through functional range of motion and found to have excellent stability. Leg lengths were restored.  I then used a 2 mm drill bits to pass the FiberWire suture from the capsule and piriformis through the greater trochanter, and secured this. Excellent posterior capsular repair was achieved. I also closed the T in the capsule.  I then irrigated the hip copiously again with pulse lavage, and repaired the fascia with Vicryl, followed by Vicryl for the subcutaneous tissue, Monocryl for the skin, Steri-Strips and sterile gauze. The wounds were injected. The patient was then awakened and returned to PACU in stable and satisfactory condition. There were no complications.  Marchia Bond, MD Orthopedic Surgeon [phone]   11/10/2016 9:07 PM

## 2016-11-10 NOTE — Anesthesia Preprocedure Evaluation (Addendum)
Anesthesia Evaluation  Patient identified by MRN, date of birth, ID band Patient awake    Reviewed: Allergy & Precautions, H&P , NPO status , Patient's Chart, lab work & pertinent test results, reviewed documented beta blocker date and time   Airway Mallampati: III  TM Distance: >3 FB Neck ROM: Full    Dental no notable dental hx. (+) Teeth Intact, Dental Advisory Given   Pulmonary sleep apnea , COPD,  COPD inhaler, Current Smoker,    Pulmonary exam normal breath sounds clear to auscultation       Cardiovascular hypertension, Pt. on medications and Pt. on home beta blockers + CAD and + Cardiac Stents   Rhythm:Regular Rate:Normal     Neuro/Psych negative neurological ROS  negative psych ROS   GI/Hepatic Neg liver ROS, GERD  Controlled,  Endo/Other  diabetes, Insulin Dependent, Oral Hypoglycemic Agents  Renal/GU negative Renal ROS  negative genitourinary   Musculoskeletal  (+) Arthritis , Osteoarthritis,    Abdominal   Peds  Hematology negative hematology ROS (+)   Anesthesia Other Findings   Reproductive/Obstetrics negative OB ROS                            Anesthesia Physical Anesthesia Plan  ASA: III  Anesthesia Plan: General   Post-op Pain Management:    Induction: Intravenous  Airway Management Planned: Oral ETT  Additional Equipment:   Intra-op Plan:   Post-operative Plan: Extubation in OR  Informed Consent: I have reviewed the patients History and Physical, chart, labs and discussed the procedure including the risks, benefits and alternatives for the proposed anesthesia with the patient or authorized representative who has indicated his/her understanding and acceptance.   Dental advisory given  Plan Discussed with: CRNA  Anesthesia Plan Comments:         Anesthesia Quick Evaluation

## 2016-11-10 NOTE — Progress Notes (Signed)
Patient with right hip fracture.    Plan for right hip hemiarthroplasty.  Await medical eval.  Surgery later today if possible.    Npo. Will see patient in next couple of hours.   [name], MD

## 2016-11-11 ENCOUNTER — Inpatient Hospital Stay (HOSPITAL_COMMUNITY): Payer: Medicare Other

## 2016-11-11 DIAGNOSIS — F0390 Unspecified dementia without behavioral disturbance: Secondary | ICD-10-CM

## 2016-11-11 DIAGNOSIS — M797 Fibromyalgia: Secondary | ICD-10-CM

## 2016-11-11 LAB — BASIC METABOLIC PANEL
Anion gap: 12 (ref 5–15)
BUN: 17 mg/dL (ref 4–21)
BUN: 17 mg/dL (ref 6–20)
CHLORIDE: 101 mmol/L (ref 101–111)
CO2: 24 mmol/L (ref 22–32)
CREATININE: 0.89 mg/dL (ref 0.44–1.00)
CREATININE: 0.9 mg/dL (ref 0.5–1.1)
Calcium: 8.4 mg/dL — ABNORMAL LOW (ref 8.9–10.3)
GFR calc non Af Amer: >60 mL/min (ref >60)
Glucose, Bld: 227 mg/dL — ABNORMAL HIGH (ref 65–99)
Glucose: 227 mg/dL
POTASSIUM: 4.6 mmol/L (ref 3.5–5.1)
POTASSIUM: 4.6 mmol/L (ref 3.4–5.3)
SODIUM: 137 mmol/L (ref 135–145)
Sodium: 137 mmol/L (ref 137–147)

## 2016-11-11 LAB — CBC
HEMATOCRIT: 35.2 % — AB (ref 36.0–46.0)
HEMOGLOBIN: 11.9 g/dL — AB (ref 12.0–15.0)
MCH: 32.9 pg (ref 26.0–34.0)
MCHC: 33.8 g/dL (ref 30.0–36.0)
MCV: 97.2 fL (ref 78.0–100.0)
Platelets: 177 10*3/uL (ref 150–400)
RBC: 3.62 MIL/uL — AB (ref 3.87–5.11)
RDW: 12.5 % (ref 11.5–15.5)
WBC: 13.8 10*3/uL — ABNORMAL HIGH (ref 4.0–10.5)

## 2016-11-11 LAB — CBC AND DIFFERENTIAL
HCT: 35 % — AB (ref 36–46)
Hemoglobin: 11.9 g/dL — AB (ref 12.0–16.0)
PLATELETS: 177 10*3/uL (ref 150–399)
WBC: 13.8 10*3/mL

## 2016-11-11 LAB — GLUCOSE, CAPILLARY
GLUCOSE-CAPILLARY: 158 mg/dL — AB (ref 65–99)
GLUCOSE-CAPILLARY: 192 mg/dL — AB (ref 65–99)
Glucose-Capillary: 190 mg/dL — ABNORMAL HIGH (ref 65–99)
Glucose-Capillary: 167 mg/dL — ABNORMAL HIGH (ref 65–99)

## 2016-11-11 MED ORDER — ACETAMINOPHEN 325 MG PO TABS
650.0000 mg | ORAL_TABLET | Freq: Four times a day (QID) | ORAL | Status: DC | PRN
  Administered 2016-11-12 – 2016-11-16 (×2): 650 mg via ORAL
  Filled 2016-11-11 (×2): qty 2

## 2016-11-11 MED ORDER — MENTHOL 3 MG MT LOZG: 1.0000 | LOZENGE | OROMUCOSAL | Status: DC | PRN

## 2016-11-11 MED ORDER — HYDROCODONE-ACETAMINOPHEN 5-325 MG PO TABS
1.0000 | ORAL_TABLET | Freq: Four times a day (QID) | ORAL | Status: DC | PRN
  Administered 2016-11-11: 1 via ORAL
  Administered 2016-11-11 (×2): 2 via ORAL
  Administered 2016-11-12 (×2): 1 via ORAL
  Administered 2016-11-12: 2 via ORAL
  Filled 2016-11-11 (×3): qty 1
  Filled 2016-11-11 (×3): qty 2

## 2016-11-11 MED ORDER — ENOXAPARIN SODIUM 40 MG/0.4ML ~~LOC~~ SOLN
40.0000 mg | Freq: Every day | SUBCUTANEOUS | Status: DC
  Administered 2016-11-11 – 2016-11-16 (×6): 40 mg via SUBCUTANEOUS
  Filled 2016-11-11 (×6): qty 0.4

## 2016-11-11 MED ORDER — MAGNESIUM CITRATE PO SOLN: 1.0000 | Freq: Once | ORAL | Status: DC | PRN

## 2016-11-11 MED ORDER — BISACODYL 10 MG RE SUPP: 10.0000 mg | Freq: Every day | RECTAL | Status: DC | PRN

## 2016-11-11 MED ORDER — ALUM & MAG HYDROXIDE-SIMETH 200-200-20 MG/5ML PO SUSP: 30.0000 mL | ORAL | Status: DC | PRN

## 2016-11-11 MED ORDER — POLYETHYLENE GLYCOL 3350 17 G PO PACK: 17.0000 g | PACK | Freq: Every day | ORAL | Status: DC | PRN

## 2016-11-11 MED ORDER — CEFAZOLIN SODIUM-DEXTROSE 2-4 GM/100ML-% IV SOLN
2.0000 g | Freq: Four times a day (QID) | INTRAVENOUS | Status: AC
  Administered 2016-11-11 (×2): 2 g via INTRAVENOUS
  Filled 2016-11-11 (×2): qty 100

## 2016-11-11 MED ORDER — MORPHINE SULFATE (PF) 2 MG/ML IV SOLN: 2.0000 mg | INTRAVENOUS | Status: DC | PRN

## 2016-11-11 MED ORDER — DOCUSATE SODIUM 100 MG PO CAPS
100.0000 mg | ORAL_CAPSULE | Freq: Two times a day (BID) | ORAL | Status: DC
  Administered 2016-11-11 – 2016-11-16 (×11): 100 mg via ORAL
  Filled 2016-11-11 (×11): qty 1

## 2016-11-11 MED ORDER — CLOPIDOGREL BISULFATE 75 MG PO TABS
75.0000 mg | ORAL_TABLET | Freq: Every day | ORAL | Status: DC
  Administered 2016-11-11 – 2016-11-14 (×4): 75 mg via ORAL
  Filled 2016-11-11 (×4): qty 1

## 2016-11-11 MED ORDER — SENNA 8.6 MG PO TABS
1.0000 | ORAL_TABLET | Freq: Two times a day (BID) | ORAL | Status: DC
  Administered 2016-11-11 – 2016-11-16 (×11): 8.6 mg via ORAL
  Filled 2016-11-11 (×11): qty 1

## 2016-11-11 MED ORDER — ACETAMINOPHEN 650 MG RE SUPP: 650.0000 mg | Freq: Four times a day (QID) | RECTAL | Status: DC | PRN

## 2016-11-11 MED ORDER — FERROUS SULFATE 325 (65 FE) MG PO TABS
325.0000 mg | ORAL_TABLET | Freq: Three times a day (TID) | ORAL | Status: DC
  Administered 2016-11-11 – 2016-11-16 (×16): 325 mg via ORAL
  Filled 2016-11-11 (×16): qty 1

## 2016-11-11 MED ORDER — ONDANSETRON HCL 4 MG PO TABS: 4.0000 mg | ORAL_TABLET | Freq: Four times a day (QID) | ORAL | Status: DC | PRN

## 2016-11-11 MED ORDER — PHENOL 1.4 % MT LIQD: 1.0000 | OROMUCOSAL | Status: DC | PRN

## 2016-11-11 MED ORDER — INSULIN ASPART 100 UNIT/ML ~~LOC~~ SOLN
0.0000 [IU] | Freq: Three times a day (TID) | SUBCUTANEOUS | Status: DC
  Administered 2016-11-11 – 2016-11-12 (×4): 2 [IU] via SUBCUTANEOUS

## 2016-11-11 MED ORDER — ONDANSETRON HCL 4 MG/2ML IJ SOLN: 4.0000 mg | Freq: Four times a day (QID) | INTRAMUSCULAR | Status: DC | PRN

## 2016-11-11 NOTE — Progress Notes (Addendum)
Physical Therapy Evaluation Patient Details Name: CARREY VITTITOE MRN: [id number] DOB: Jun 21, 1941 Today's Date: 11/16/2016   History of Present Illness     Clinical Impression  Patient was required significant assist to transfer to the edge of the bed. Once to the edge of the bed she required max a to transfer to the commode and back top the chair. Despite baseline dementia she did a good job following her precautions. Per PA patient will be TDWB until she has x-rays this afternoon. Assess weight bearing tomorrow. She would benefit from rehab at a SNF.     Follow Up Recommendations      Equipment Recommendations       Recommendations for Other Services       Precautions / Restrictions Restrictions Weight Bearing Restrictions: Yes RLE Weight Bearing: Weight bearing as tolerated      Mobility  Bed Mobility                  Transfers                    Ambulation/Gait                Stairs            Wheelchair Mobility    Modified Rankin (Stroke Patients Only)       Balance                                             Pertinent Vitals/Pain      Home Living                        Prior Function Level of Independence: Needs assistance   Gait / Transfers Assistance Needed: Using RW  ADL's / Homemaking Assistance Needed: At ALF, pt requires assist for dressing/bathing.        Hand Dominance        Extremity/Trunk Assessment                Communication      Cognition                            General Comments      Exercises     Assessment/Plan    PT Assessment    PT Problem List            PT Treatment Interventions      PT Goals (Current goals can be found in the Care Plan section)       Frequency     Barriers to discharge        Co-evaluation               End of Session                 Time:  -      Charges:         PT G  Codes:        Cleburn Maiolo Sloan Leiter PT DPT  11/16/2016, 11:33 AM

## 2016-11-11 NOTE — Progress Notes (Signed)
Patient ID: [name], female   DOB: 12/11/1940, 76 y.o.   MRN: [id number]     Subjective:  Patient reports pain as mild.  Patient in bed and in no acute distress.  She follows commands and answers questions.  Objective:   VITALS:   Vitals:   11/10/16 2245 11/10/16 2300 11/11/16 0413 11/11/16 0846  BP:  (!) 125/49 (!) 131/54   Pulse:  90 89   Resp: (!) 25 (!) 22 18   Temp:  98.1 F (36.7 C) 98.5 F (36.9 C)   TempSrc:  Oral Oral   SpO2:  100% 99% 97%    ABD soft Sensation intact distally Dorsiflexion/Plantar flexion intact Incision: dressing C/D/I and no drainage Good foot and ankle motion  Lab Results  Component Value Date   WBC 13.8 (H) 11/11/2016   HGB 11.9 (L) 11/11/2016   HCT 35.2 (L) 11/11/2016   MCV 97.2 11/11/2016   PLT 177 11/11/2016   BMET    Component Value Date/Time   NA 137 11/11/2016 0228   K 4.6 11/11/2016 0228   CL 101 11/11/2016 0228   CO2 24 11/11/2016 0228   GLUCOSE 227 (H) 11/11/2016 0228   BUN 17 11/11/2016 0228   CREATININE 0.89 11/11/2016 0228   CALCIUM 8.4 (L) 11/11/2016 0228   GFRNONAA >60 11/11/2016 0228   GFRAA >60 11/11/2016 0228     Assessment/Plan: 1 Day Post-Op   Principal Problem:   Closed right hip fracture (HCC) Active Problems:   Fibromyalgia   DM2 (diabetes mellitus, type 2) ([place])   Essential hypertension, benign   Dyslipidemia   Coronary atherosclerosis of native coronary artery   Chronic systolic heart failure (HCC)   COPD (chronic obstructive pulmonary disease) ([place])   Senile dementia without behavioral disturbance   Advance diet Up with therapy New xrays ordered and patient should be TTWB until images taken Continue plan per medicine   Remonia Richter 11/11/2016, 10:25 AM  Discussed and agree with above. Will recheck xr of femur to be sure no periprosthetic crack.    Marchia Bond, MD Cell [phone]

## 2016-11-11 NOTE — Progress Notes (Addendum)
PROGRESS NOTE    [name]  [id number] DOB: February 13, 1941 DOA: 11/10/2016  PCP: Vidal Schwalbe, MD   Brief Narrative:  [name] is a 75 y.o. female with medical history significant for COPD and tobacco abuse, dementia, chronic systolic heart failure, CAD with last cardiac catheterization 2016 demonstrating patent stent, and fibromyalgia. Patient with mechanical fall and dining hall facility today without loss of consciousness. Reported right hip pain also sustained small contusion to right posterior head.  Subjective: Pleasantly confused- no complaints.   Assessment & Plan:    Closed right hip fracture  -Patient presents with acute impacted subcapital fracture of the right hip requiring operative intervention - s/p right hemiarthroplasty on 12/22 - management per ortho- per PT, will need SNF  Active Problems:   Coronary atherosclerosis of native coronary artery -Underwent Rotablator with drug-eluting stent 2 to RCA in 2012 with catheterization in 2016 demonstrating patent stent - will resume Plavix- hold ASA as she is also on Lovenox for DVT prophylaxis and will be a very high risk for bleed- can resume Aspirin after off on Lovenox - continue  Imdur, Lipitor and beta blocker   Anemia - due to acute blood loss? Ortho has started Iron replacement - baseline Hb 13-14 >> 11.9 today    DM2 (diabetes mellitus, type 2)  -  Lantus and SSI -Hold metformin    Essential hypertension, benign -Current blood pressure controlled -Continue Toprol    Chronic systolic heart failure /Grade 1 Diastolic dysfunction -Most recent echocardiogram demonstrates preserved LV function but patient does have mild grade 1 diastolic dysfunction which is compensated  - holding lasix    COPD (chronic obstructive pulmonary disease)  -Compensated without wheezing -Continue preadmission MDIs    Fibromyalgia -Preadmission was on low-dose Percocet every 6 hours PRN     Dyslipidemia -Continue Lipitor    Senile dementia without behavioral disturbance -Continue Namenda and Remeron -Monitoring for acute delirium- appears stable  DVT prophylaxis: Lovenox Code Status: DNR Family Communication:  Disposition Plan: SNF Consultants:   ortho Procedures:   R hip hemiarthroplasty  Antimicrobials:  Anti-infectives    Start     Dose/Rate Route Frequency Ordered Stop   11/11/16 1830  ceFAZolin (ANCEF) IVPB 2g/100 mL premix     2 g 200 mL/hr over 30 Minutes Intravenous On call to O.R. 11/10/16 1218 11/10/16 1930   11/11/16 0300  ceFAZolin (ANCEF) IVPB 2g/100 mL premix     2 g 200 mL/hr over 30 Minutes Intravenous Every 6 hours 11/11/16 0159 11/11/16 0902       Objective: Vitals:   11/10/16 2245 11/10/16 2300 11/11/16 0413 11/11/16 0846  BP:  (!) 125/49 (!) 131/54   Pulse:  90 89   Resp: (!) 25 (!) 22 18   Temp:  98.1 F (36.7 C) 98.5 F (36.9 C)   TempSrc:  Oral Oral   SpO2:  100% 99% 97%    Intake/Output Summary (Last 24 hours) at 11/11/16 1400 Last data filed at 11/11/16 0630  Gross per 24 hour  Intake             1400 ml  Output              850 ml  Net              550 ml   There were no vitals filed for this visit.  Examination: General exam: Appears comfortable HEENT: PERRLA, oral mucosa moist, no sclera icterus or thrush Respiratory system: Clear to  auscultation. Respiratory effort normal. Cardiovascular system: S1 & S2 heard, RRR.  No murmurs  Gastrointestinal system: Abdomen soft, non-tender, nondistended. Normal bowel sound. No organomegaly Central nervous system: Alert - confused to time and place-  No focal neurological deficits. Extremities: No cyanosis, clubbing or edema Skin: No rashes or ulcers Psychiatry:  Mood & affect appropriate.     Data Reviewed: I have personally reviewed following labs and imaging studies  CBC:  Recent Labs Lab 11/10/16 0920 11/11/16 0228  WBC 13.0* 13.8*  NEUTROABS 10.3*  --   HGB  13.8 11.9*  HCT 41.0 35.2*  MCV 98.1 97.2  PLT 198 [id number]   Basic Metabolic Panel:  Recent Labs Lab 11/10/16 0920 11/11/16 0228  NA 138 137  K 4.2 4.6  CL 101 101  CO2 28 24  GLUCOSE 164* 227*  BUN 17 17  CREATININE 0.93 0.89  CALCIUM 9.5 8.4*   GFR: CrCl cannot be calculated (Unknown ideal weight.). Liver Function Tests: No results for input(s): AST, ALT, ALKPHOS, BILITOT, PROT, ALBUMIN in the last 168 hours. No results for input(s): LIPASE, AMYLASE in the last 168 hours. No results for input(s): AMMONIA in the last 168 hours. Coagulation Profile:  Recent Labs Lab 11/10/16 0920  INR 1.04   Cardiac Enzymes: No results for input(s): CKTOTAL, CKMB, CKMBINDEX, TROPONINI in the last 168 hours. BNP (last 3 results) No results for input(s): PROBNP in the last 8760 hours. HbA1C: No results for input(s): HGBA1C in the last 72 hours. CBG:  Recent Labs Lab 11/10/16 1639 11/10/16 2212 11/11/16 0001 11/11/16 0427 11/11/16 0819  GLUCAP 169* 211* 191* 190* 158*   Lipid Profile: No results for input(s): CHOL, HDL, LDLCALC, TRIG, CHOLHDL, LDLDIRECT in the last 72 hours. Thyroid Function Tests: No results for input(s): TSH, T4TOTAL, FREET4, T3FREE, THYROIDAB in the last 72 hours. Anemia Panel: No results for input(s): VITAMINB12, FOLATE, FERRITIN, TIBC, IRON, RETICCTPCT in the last 72 hours. Urine analysis:    Component Value Date/Time   COLORURINE YELLOW 11/10/2016 1255   APPEARANCEUR HAZY (A) 11/10/2016 1255   LABSPEC 1.011 11/10/2016 1255   PHURINE 5.0 11/10/2016 1255   GLUCOSEU NEGATIVE 11/10/2016 1255   HGBUR NEGATIVE 11/10/2016 1255   [place] 11/10/2016 1255   KETONESUR NEGATIVE 11/10/2016 1255   PROTEINUR NEGATIVE 11/10/2016 1255   UROBILINOGEN 0.2 01/23/2014 2343   NITRITE NEGATIVE 11/10/2016 1255   LEUKOCYTESUR MODERATE (A) 11/10/2016 1255   Sepsis Labs: @LABRCNTIP (procalcitonin:4,lacticidven:4) ) Recent Results (from the past 240 hour(s))   Urine culture     Status: Abnormal (Preliminary result)   Collection Time: 11/10/16 12:55 PM  Result Value Ref Range Status   Specimen Description URINE, CATHETERIZED  Final   Special Requests NONE  Final   Culture (A)  Final    >=100,000 COLONIES/mL PROTEUS MIRABILIS SUSCEPTIBILITIES TO FOLLOW    Report Status PENDING  Incomplete  Surgical pcr screen     Status: Abnormal   Collection Time: 11/10/16 12:56 PM  Result Value Ref Range Status   MRSA, PCR NEGATIVE NEGATIVE Final   Staphylococcus aureus POSITIVE (A) NEGATIVE Final    Comment:        The Xpert SA Assay (FDA approved for NASAL specimens in patients over 88 years of age), is one component of a comprehensive surveillance program.  Test performance has been validated by [facility] for patients greater than or equal to 71 year old. It is not intended to diagnose infection nor to guide or monitor treatment.  Radiology Studies: Dg Chest 1 View  Result Date: 11/10/2016 CLINICAL DATA:  75 year old female status post mechanical fall earlier today. Right hip pain. EXAM: CHEST 1 VIEW COMPARISON:  Prior chest x-ray 07/03/2016 FINDINGS: Stable mild cardiomegaly. Mediastinal contours remain within normal limits. Atherosclerotic calcifications again noted in the transverse aorta. Chronic lingular scarring/ atelectasis is similar compared to prior imaging. No evidence of pneumothorax, new airspace opacity or pulmonary nodule. No acute osseous abnormality. IMPRESSION: 1. Stable chest x-ray without evidence of acute cardiopulmonary process. 2.  Aortic Atherosclerosis (ICD10-170.0) 3. Chronic lingular atelectasis/scarring. Electronically Signed   By: Jacqulynn Cadet M.D.   On: 11/10/2016 09:55   Ct Head Wo Contrast  Result Date: 11/10/2016 CLINICAL DATA:  Fall today. Right frontotemporal headaches with right neck tenderness. EXAM: CT HEAD WITHOUT CONTRAST CT CERVICAL SPINE WITHOUT CONTRAST TECHNIQUE: Multidetector CT  imaging of the head and cervical spine was performed following the standard protocol without intravenous contrast. Multiplanar CT image reconstructions of the cervical spine were also generated. COMPARISON:  CT head 04/20/2016 and 01/02/2016. FINDINGS: CT HEAD FINDINGS Brain: There is no evidence of acute intracranial hemorrhage, mass lesion, brain edema or extra-axial fluid collection. Stable atrophy with disproportionate dilatation of the lateral ventricles. There is stable chronic periventricular white matter disease and chronic lacunar infarcts in the right thalamus and lentiform nucleus. There is no CT evidence of acute cortical infarction. Vascular: Intracranial vascular calcifications are present. No hyperdense vessels identified. Skull: Negative for fracture or focal lesion. Sinuses/Orbits: The visualized paranasal sinuses and mastoid air cells are clear. No orbital abnormalities are seen. Other: None. CT CERVICAL SPINE FINDINGS Alignment: Normal. Skull base and vertebrae: No evidence of acute cervical spine fracture or traumatic subluxation. There is multilevel spondylosis with asymmetric facet arthropathy on the right. Ossification of the ligamentum nuchae noted. Soft tissues and spinal canal: No prevertebral fluid or swelling. No visible canal hematoma. Bilateral carotid atherosclerosis. Disc levels: No large disc herniation or spinal canal compromise identified. There is multilevel facet arthropathy with probable ankylosis of the right C3-4 facet joint. No high-grade foraminal narrowing. Upper chest: The visualized lung apices are clear. There is atherosclerosis of the great vessels. Other: None. IMPRESSION: 1. Stable head CT demonstrating atrophy with disproportionate ventricular dilatation and chronic periventricular white matter disease. No acute intracranial findings. 2. No evidence of acute cervical spine fracture, traumatic subluxation or static signs of instability. Multilevel cervical  spondylosis. 3. Severe atherosclerosis. Electronically Signed   By: Richardean Sale M.D.   On: 11/10/2016 10:30   Ct Cervical Spine Wo Contrast  Result Date: 11/10/2016 CLINICAL DATA:  Fall today. Right frontotemporal headaches with right neck tenderness. EXAM: CT HEAD WITHOUT CONTRAST CT CERVICAL SPINE WITHOUT CONTRAST TECHNIQUE: Multidetector CT imaging of the head and cervical spine was performed following the standard protocol without intravenous contrast. Multiplanar CT image reconstructions of the cervical spine were also generated. COMPARISON:  CT head 04/20/2016 and 01/02/2016. FINDINGS: CT HEAD FINDINGS Brain: There is no evidence of acute intracranial hemorrhage, mass lesion, brain edema or extra-axial fluid collection. Stable atrophy with disproportionate dilatation of the lateral ventricles. There is stable chronic periventricular white matter disease and chronic lacunar infarcts in the right thalamus and lentiform nucleus. There is no CT evidence of acute cortical infarction. Vascular: Intracranial vascular calcifications are present. No hyperdense vessels identified. Skull: Negative for fracture or focal lesion. Sinuses/Orbits: The visualized paranasal sinuses and mastoid air cells are clear. No orbital abnormalities are seen. Other: None. CT CERVICAL SPINE FINDINGS Alignment: Normal. Skull  base and vertebrae: No evidence of acute cervical spine fracture or traumatic subluxation. There is multilevel spondylosis with asymmetric facet arthropathy on the right. Ossification of the ligamentum nuchae noted. Soft tissues and spinal canal: No prevertebral fluid or swelling. No visible canal hematoma. Bilateral carotid atherosclerosis. Disc levels: No large disc herniation or spinal canal compromise identified. There is multilevel facet arthropathy with probable ankylosis of the right C3-4 facet joint. No high-grade foraminal narrowing. Upper chest: The visualized lung apices are clear. There is  atherosclerosis of the great vessels. Other: None. IMPRESSION: 1. Stable head CT demonstrating atrophy with disproportionate ventricular dilatation and chronic periventricular white matter disease. No acute intracranial findings. 2. No evidence of acute cervical spine fracture, traumatic subluxation or static signs of instability. Multilevel cervical spondylosis. 3. Severe atherosclerosis. Electronically Signed   By: Richardean Sale M.D.   On: 11/10/2016 10:30   Dg Hip Port Unilat With Pelvis 1v Right  Result Date: 11/10/2016 CLINICAL DATA:  Status post ORIF EXAM: DG HIP (WITH OR WITHOUT PELVIS) 1V PORT RIGHT COMPARISON:  11/10/2016 FINDINGS: The patient is status post right hip replacement with anatomic alignment. Pubic symphysis appears intact. Small amount of soft tissue gas lateral to the right hip is felt postsurgical. IMPRESSION: Status post right hip replacement with expected postsurgical changes Electronically Signed   By: Donavan Foil M.D.   On: 11/10/2016 23:13   Dg Hip Unilat With Pelvis 2-3 Views Right  Result Date: 11/10/2016 CLINICAL DATA:  Status post fall in the 9 hall today. Persistent right anterior hip pain made worse with movement. EXAM: DG HIP (WITH OR WITHOUT PELVIS) 2-3V RIGHT COMPARISON:  Coronal and sagittal images from an abdominal and pelvic CT scan dated April 27, 2016 FINDINGS: There is an acute impacted fracture of the subcapital region of the right hip. The femoral head remains normally related to the right hip joint. The joint space is well maintained. The intertrochanteric and sub trochanteric regions are normal. The observed bony pelvis exhibits no acute fracture. There is diffuse osteopenia. IMPRESSION: There is an acute impacted subcapital fracture of the right hip. Electronically Signed   By: David  Martinique M.D.   On: 11/10/2016 09:53   Dg Femur, Min 2 Views Right  Result Date: 11/11/2016 CLINICAL DATA:  Status post right hip fracture EXAM: RIGHT FEMUR 2 VIEWS  COMPARISON:  None. FINDINGS: Status post right hip hemiarthroplasty. No evidence of complication. Mild degenerative changes of the knee. Soft tissue gas overlying the hip, postsurgical. IMPRESSION: Status post right hip hemiarthroplasty, with associated postsurgical changes. Electronically Signed   By: Julian Hy M.D.   On: 11/11/2016 13:44      Scheduled Meds: . atorvastatin  40 mg Oral Daily  . docusate sodium  100 mg Oral BID  . enoxaparin (LOVENOX) injection  40 mg Subcutaneous Daily  . ferrous sulfate  325 mg Oral TID PC  . insulin aspart  0-9 Units Subcutaneous Q4H  . insulin glargine  8 Units Subcutaneous QHS  . isosorbide mononitrate  15 mg Oral Daily  . memantine  10 mg Oral BID  . metoprolol succinate  12.5 mg Oral Daily  . mirtazapine  15 mg Oral QHS  . mometasone-formoterol  2 puff Inhalation BID  . mupirocin ointment  1 application Nasal BID  . pantoprazole  40 mg Oral Daily  . senna  1 tablet Oral BID   Continuous Infusions: . sodium chloride 75 mL/hr (11/11/16 0257)     LOS: 1 day  Time spent in minutes: 36    Coos, MD [facility] Pager: [email or web address] Password [email or web address] 11/11/2016, 2:00 PM

## 2016-11-11 NOTE — Progress Notes (Signed)
I am concerned about the possibility of periprosthetic fracture given the tightness of the canal fill on xr and feel intraoperative and bone quality.  Will recheck x-rays of femur.    With dementia, she is not likely to be able to limit weight bearing, but will try until repeat xr are back.    [name], MD

## 2016-11-11 NOTE — Evaluation (Signed)
Occupational Therapy Evaluation Patient Details Name: [name] MRN: [id number] DOB: 1941/11/20 Today's Date: 11/11/2016    History of Present Illness Pt is a 75 y.o. female with R hip fracture secondary to fall and now s/p arthroplasty bipolar hip (hemiarthroplasty). Pt has a PMH significant for sleep apnea, dementia, high cholesterol, hypertension, glaucoma, and coronary artery disease.   Clinical Impression   PTA, pt was living in ALF and required assistance for basic ADL but was able to complete dressing with min assist. Pt was using RW for functional mobility. Pt currently requires max assist +2 for stand-pivot toilet transfers and max assist with LB ADL. Pt would benefit from continued OT services while admitted to improve independence with ADL and functional mobility. Recommend short-term SNF placement for continued OT services post-acute D/C to facilitate maximum return to PLOF and safety with ADL. OT will continue to follow acutely.    Follow Up Recommendations  SNF;Supervision/Assistance - 24 hour    Equipment Recommendations  Other (comment) (TBD at next venue of care)    Recommendations for Other Services       Precautions / Restrictions Precautions Precautions: Posterior Hip Precaution Booklet Issued: No Precaution Comments: Verbally reviewed with pt and family Restrictions Weight Bearing Restrictions: Yes RLE Weight Bearing: Touchdown weight bearing      Mobility Bed Mobility Overal bed mobility: Needs Assistance Bed Mobility: Sit to Supine       Sit to supine: Mod assist;+2 for physical assistance      Transfers Overall transfer level: Needs assistance   Transfers: Sit to/from Stand Sit to Stand: Max assist;+2 physical assistance              Balance Overall balance assessment: Needs assistance Sitting-balance support: Bilateral upper extremity supported;Feet supported Sitting balance-Leahy Scale: Fair     Standing balance support:  Bilateral upper extremity supported;During functional activity Standing balance-Leahy Scale: Zero                              ADL Overall ADL's : Needs assistance/impaired Eating/Feeding: Set up;Supervision/ safety;Sitting   Grooming: Minimal assistance;Sitting   Upper Body Bathing: Moderate assistance;Sitting   Lower Body Bathing: Maximal assistance;Sit to/from stand   Upper Body Dressing : Moderate assistance;Sitting   Lower Body Dressing: Maximal assistance;Sit to/from stand   Toilet Transfer: +2 for physical assistance;Maximal assistance;RW;BSC;Stand-pivot   Toileting- Water quality scientist and Hygiene: Total assistance;Sit to/from stand         General ADL Comments: Pt and family educated on posterior hip precautions and expectations for rehabilitation.     Vision Vision Assessment?: No apparent visual deficits (Does not have her glasses with her)   Perception     Praxis      Pertinent Vitals/Pain Pain Assessment: Faces Faces Pain Scale: Hurts whole lot Pain Location: R hip Pain Descriptors / Indicators: Aching;Grimacing;Sore Pain Intervention(s): Limited activity within patient's tolerance;Monitored during session;Repositioned     Hand Dominance Right   Extremity/Trunk Assessment Upper Extremity Assessment Upper Extremity Assessment: Generalized weakness;LUE deficits/detail LUE Deficits / Details: Decreased AROM of shoulder but [facility]   Lower Extremity Assessment Lower Extremity Assessment: Defer to PT evaluation       Communication Communication Communication: No difficulties   Cognition Arousal/Alertness: Awake/alert Behavior During Therapy: WFL for tasks assessed/performed Overall Cognitive Status: Within Functional Limits for tasks assessed                     General  Comments       Exercises       Shoulder Instructions      Home Living Family/patient expects to be discharged to:: Skilled nursing facility                                  Additional Comments: Pt lives at [place] and will return there following SNF.      Prior Functioning/Environment Level of Independence: Needs assistance  Gait / Transfers Assistance Needed: Using RW ADL's / Homemaking Assistance Needed: At ALF, pt requires assist for dressing/bathing.            OT Problem List: Decreased strength;Decreased range of motion;Decreased activity tolerance;Impaired balance (sitting and/or standing);Decreased safety awareness;Decreased knowledge of use of DME or AE;Decreased knowledge of precautions;Pain   OT Treatment/Interventions: Therapeutic exercise;DME and/or AE instruction;Therapeutic activities;Patient/family education;Balance training    OT Goals(Current goals can be found in the care plan section) Acute Rehab OT Goals Patient Stated Goal: go home OT Goal Formulation: With patient/family Time For Goal Achievement: 11/18/16 Potential to Achieve Goals: Fair ADL Goals Pt Will Perform Grooming: with min guard assist;standing Pt Will Perform Upper Body Dressing: with set-up;sitting Pt Will Perform Lower Body Dressing: with min assist;sit to/from stand Pt Will Transfer to Toilet: with min assist;ambulating;bedside commode  OT Frequency: Min 1X/week   Barriers to D/C:            Co-evaluation              End of Session Equipment Utilized During Treatment: Gait belt;Rolling walker Nurse Communication: Mobility status  Activity Tolerance: Patient tolerated treatment well Patient left: in bed;Other (comment);with nursing/sitter in room (Transporting to ultrasound)   Time: [id number] OT Time Calculation (min): 20 min Charges:  OT General Charges $OT Visit: 1 Procedure OT Evaluation $OT Eval Moderate Complexity: 1 Procedure  Norman Herrlich, OTR/L [phone] 11/11/2016, 1:34 PM

## 2016-11-11 NOTE — Progress Notes (Signed)
Repeat xr looks ok.  Will make her WBAT.  [name], MD

## 2016-11-12 DIAGNOSIS — R509 Fever, unspecified: Secondary | ICD-10-CM

## 2016-11-12 DIAGNOSIS — I2510 Atherosclerotic heart disease of native coronary artery without angina pectoris: Secondary | ICD-10-CM

## 2016-11-12 DIAGNOSIS — I5032 Chronic diastolic (congestive) heart failure: Secondary | ICD-10-CM

## 2016-11-12 HISTORY — DX: Chronic diastolic (congestive) heart failure: I50.32

## 2016-11-12 LAB — CBC
HCT: 30 % — ABNORMAL LOW (ref 36.0–46.0)
Hemoglobin: 9.9 g/dL — ABNORMAL LOW (ref 12.0–15.0)
MCH: 32.2 pg (ref 26.0–34.0)
MCHC: 33 g/dL (ref 30.0–36.0)
MCV: 97.7 fL (ref 78.0–100.0)
PLATELETS: 169 10*3/uL (ref 150–400)
RBC: 3.07 MIL/uL — ABNORMAL LOW (ref 3.87–5.11)
RDW: 13.1 % (ref 11.5–15.5)
WBC: 11.3 10*3/uL — ABNORMAL HIGH (ref 4.0–10.5)

## 2016-11-12 LAB — BASIC METABOLIC PANEL
Anion gap: 9 (ref 5–15)
BUN: 12 mg/dL (ref 4–21)
BUN: 12 mg/dL (ref 6–20)
CALCIUM: 8.1 mg/dL — AB (ref 8.9–10.3)
CO2: 26 mmol/L (ref 22–32)
CREATININE: 0.73 mg/dL (ref 0.44–1.00)
Chloride: 102 mmol/L (ref 101–111)
Creatinine: 0.7 mg/dL (ref 0.5–1.1)
Glucose, Bld: 216 mg/dL — ABNORMAL HIGH (ref 65–99)
Glucose: 216 mg/dL
Potassium: 3.8 mmol/L (ref 3.4–5.3)
Potassium: 3.8 mmol/L (ref 3.5–5.1)
SODIUM: 137 mmol/L (ref 135–145)
Sodium: 137 mmol/L (ref 137–147)

## 2016-11-12 LAB — GLUCOSE, CAPILLARY
GLUCOSE-CAPILLARY: 238 mg/dL — AB (ref 65–99)
Glucose-Capillary: 186 mg/dL — ABNORMAL HIGH (ref 65–99)
Glucose-Capillary: 173 mg/dL — ABNORMAL HIGH (ref 65–99)
Glucose-Capillary: 183 mg/dL — ABNORMAL HIGH (ref 65–99)
Glucose-Capillary: 220 mg/dL — ABNORMAL HIGH (ref 65–99)

## 2016-11-12 LAB — CBC AND DIFFERENTIAL
HEMATOCRIT: 30 % — AB (ref 36–46)
HEMOGLOBIN: 9.9 g/dL — AB (ref 12.0–16.0)
PLATELETS: 169 10*3/uL (ref 150–399)
WBC: 11.3 10^3/mL

## 2016-11-12 LAB — URINE CULTURE: Culture: >=100000 — AB

## 2016-11-12 NOTE — Clinical Social Work Placement (Signed)
   CLINICAL SOCIAL WORK PLACEMENT  NOTE  Date:  11/12/2016  Patient Details  Name: [name] MRN: [id number] Date of Birth: 25-Jan-1941  Clinical Social Work is seeking post-discharge placement for this patient at the [place] level of care (*CSW will initial, date and re-position this form in  chart as items are completed):  Yes   Patient/family provided with [place] Work Department's list of facilities offering this level of care within the geographic area requested by the patient (or if unable, by the patient's family).  Yes   Patient/family informed of their freedom to choose among providers that offer the needed level of care, that participate in Medicare, Medicaid or managed care program needed by the patient, have an available bed and are willing to accept the patient.  Yes   Patient/family informed of 's ownership interest in [facility] and [facility], as well as of the fact that they are under no obligation to receive care at these facilities.  PASRR submitted to EDS on 11/12/16     PASRR number received on       Existing PASRR number confirmed on       FL2 transmitted to all facilities in geographic area requested by pt/family on 11/12/16     FL2 transmitted to all facilities within larger geographic area on       Patient informed that his/her managed care company has contracts with or will negotiate with certain facilities, including the following:            Patient/family informed of bed offers received.  Patient chooses bed at       Physician recommends and patient chooses bed at      Patient to be transferred to   on  .  Patient to be transferred to facility by       Patient family notified on   of transfer.  Name of family member notified:        PHYSICIAN Please sign FL2     Additional Comment:    _______________________________________________ Glendon Axe A 11/12/2016, 11:14 AM

## 2016-11-12 NOTE — Clinical Social Work Note (Signed)
Clinical Social Work Assessment  Patient Details  Name: [name] MRN: [id number] Date of Birth: Nov 16, 1941  Date of referral:  11/12/16               Reason for consult:  Facility Placement, Discharge Planning                Permission sought to share information with:  Family Supports, Case Freight forwarder, Customer service manager Permission granted to share information::  Yes, Verbal Permission Granted  Name::      Benjamine Mola [name] )  Agency::   (SNF's )  Relationship::   (Daughter )  Contact Information:   [phone])  Housing/Transportation Living arrangements for the past 2 months:  [place] of Information:  Adult Children Patient Interpreter Needed:  None Criminal Activity/Legal Involvement Pertinent to Current Situation/Hospitalization:  No - Comment as needed Significant Relationships:  Adult Children Lives with:  Facility Resident Do you feel safe going back to the place where you live?  No Need for family participation in patient care:  Yes (Comment)  Care giving concerns:  Patient admitted from [facility]. PT recommending STR at SNF however plan is for patient to return to ALF once STR is completed.    Social Worker assessment / plan:  MSW spoke with patient's daughter, Benjamine Mola at length in reference to post-acute placement for SNF. MSW introduced MSW role and SNF process. Pt's dtr reported that she is agreeable to SNF placement and stated her mother was at [facility] in the past. Pt's dtr prefers [facility] or [facility]. Pt's dtr expressed concerns of patient likely needing a higher level of care long term however reported that patient does not qualify for Medicaid due to monthly income. Pt pays $2,400 per month at [employer]. No further concerns reported by dtr at this time. MSW will continue to follow pt and pt's family for continued support and to facilitate pt's dc once  stable.   Employment status:  Retired Nurse, adult PT Recommendations:  [place] / Referral to community resources:  [place]  Patient/Family's Response to care:  MSW visited patient at bedside, pt lying in bed, confused and requesting to use restroom. Pt's dtr agreeable to STR at SNF. Pt's dtr supportive and strongly involved. Pt's dtr appreciated social work intervention.   Patient/Family's Understanding of and Emotional Response to Diagnosis, Current Treatment, and Prognosis:  Patient dtr knowledgeable of medical intervention.   Emotional Assessment Appearance:  Appears stated age Attitude/Demeanor/Rapport:  Unable to Assess Affect (typically observed):  Unable to Assess Orientation:  Oriented to Self Alcohol / Substance use:  Not Applicable Psych involvement (Current and /or in the community):  No (Comment)  Discharge Needs  Concerns to be addressed:  Care Coordination Readmission within the last 30 days:  No Current discharge risk:  None Barriers to Discharge:  Continued Medical Work up, [employer]   Glendon Axe A 11/12/2016, 10:49 AM

## 2016-11-12 NOTE — Progress Notes (Signed)
PROGRESS NOTE    [name]  [id number] DOB: 05-28-1941 DOA: 11/10/2016  PCP: Vidal Schwalbe, MD   Brief Narrative:  [name] is a 75 y.o. female with medical history significant for COPD and tobacco abuse, dementia, chronic systolic heart failure, CAD with last cardiac catheterization 2016 demonstrating patent stent, and fibromyalgia. Patient with mechanical fall and dining hall facility today without loss of consciousness. Reported right hip pain also sustained small contusion to right posterior head.  Subjective: Pleasantly confused- no complaints.   Assessment & Plan:    Closed right hip fracture  -Patient presents with acute impacted subcapital fracture of the right hip requiring operative intervention - s/p right hemiarthroplasty on 12/22 - management per ortho- per PT, will need SNF  Active Problems:   Coronary atherosclerosis of native coronary artery -Underwent Rotablator with drug-eluting stent 2 to RCA in 2012 with catheterization in 2016 demonstrating patent stent - will resume Plavix - hold ASA as she is also on Lovenox for DVT prophylaxis and will be a very high risk for bleed- can resume Aspirin after off on Lovenox - continue  Imdur, Lipitor and beta blocker   Low grade fever last night - possibly related to surgery- follow- would recommend a clean catch or I and O cath collected UA if she continues to be febrile- no resp symptoms today - not hypoxic  Anemia - due to acute blood loss? Ortho has started Iron replacement - baseline Hb 13-14 >> 11.9 >> 9.9    DM2 (diabetes mellitus, type 2)  -  Lantus and SSI -Hold metformin    Essential hypertension, benign -Current blood pressure controlled -Continue Toprol    Chronic grade 1 Diastolic dysfunction -Most recent echocardiogram demonstrates preserved LV function but patient does have mild grade 1 diastolic dysfunction which is compensated  - holding lasix for now    COPD (chronic  obstructive pulmonary disease)  -Compensated without wheezing -Continue preadmission MDIs    Fibromyalgia -Preadmission was on low-dose Percocet every 6 hours PRN    Dyslipidemia -Continue Lipitor    Senile dementia without behavioral disturbance -Continue Namenda and Remeron -Monitoring for acute delirium- appears stable  DVT prophylaxis: Lovenox Code Status: DNR Family Communication:  Disposition Plan: SNF Consultants:   ortho Procedures:   R hip hemiarthroplasty  Antimicrobials:  Anti-infectives    Start     Dose/Rate Route Frequency Ordered Stop   11/11/16 1830  ceFAZolin (ANCEF) IVPB 2g/100 mL premix     2 g 200 mL/hr over 30 Minutes Intravenous On call to O.R. 11/10/16 1218 11/10/16 1930   11/11/16 0300  ceFAZolin (ANCEF) IVPB 2g/100 mL premix     2 g 200 mL/hr over 30 Minutes Intravenous Every 6 hours 11/11/16 0159 11/11/16 0902       Objective: Vitals:   11/11/16 2058 11/12/16 0459 11/12/16 0748 11/12/16 1300  BP:  (!) 121/44  (!) 128/58  Pulse:  (!) 101  98  Resp:  16  16  Temp:  (!) 100.6 F (38.1 C)  97.5 F (36.4 C)  TempSrc:  Oral  Oral  SpO2: 95% 90% 92% 95%    Intake/Output Summary (Last 24 hours) at 11/12/16 1534 Last data filed at 11/12/16 1300  Gross per 24 hour  Intake              360 ml  Output                0 ml  Net  360 ml   There were no vitals filed for this visit.  Examination: General exam: Appears comfortable HEENT: PERRLA, oral mucosa moist, no sclera icterus or thrush Respiratory system: Clear to auscultation. Respiratory effort normal. Cardiovascular system: S1 & S2 heard, RRR.  No murmurs  Gastrointestinal system: Abdomen soft, non-tender, nondistended. Normal bowel sound. No organomegaly Central nervous system: Alert - confused to time and place-  No focal neurological deficits. Extremities: No cyanosis, clubbing or edema Skin: No rashes or ulcers Psychiatry:  Mood & affect appropriate.     Data  Reviewed: I have personally reviewed following labs and imaging studies  CBC:  Recent Labs Lab 11/10/16 0920 11/11/16 0228 11/12/16 0633  WBC 13.0* 13.8* 11.3*  NEUTROABS 10.3*  --   --   HGB 13.8 11.9* 9.9*  HCT 41.0 35.2* 30.0*  MCV 98.1 97.2 97.7  PLT 198 177 [id number]   Basic Metabolic Panel:  Recent Labs Lab 11/10/16 0920 11/11/16 0228 11/12/16 0633  NA 138 137 137  K 4.2 4.6 3.8  CL 101 101 102  CO2 28 24 26   GLUCOSE 164* 227* 216*  BUN 17 17 12   CREATININE 0.93 0.89 0.73  CALCIUM 9.5 8.4* 8.1*   GFR: CrCl cannot be calculated (Unknown ideal weight.). Liver Function Tests: No results for input(s): AST, ALT, ALKPHOS, BILITOT, PROT, ALBUMIN in the last 168 hours. No results for input(s): LIPASE, AMYLASE in the last 168 hours. No results for input(s): AMMONIA in the last 168 hours. Coagulation Profile:  Recent Labs Lab 11/10/16 0920  INR 1.04   Cardiac Enzymes: No results for input(s): CKTOTAL, CKMB, CKMBINDEX, TROPONINI in the last 168 hours. BNP (last 3 results) No results for input(s): PROBNP in the last 8760 hours. HbA1C: No results for input(s): HGBA1C in the last 72 hours. CBG:  Recent Labs Lab 11/11/16 1635 11/11/16 2024 11/12/16 0525 11/12/16 0907 11/12/16 1145  GLUCAP 192* 167* 186* 173* 183*   Lipid Profile: No results for input(s): CHOL, HDL, LDLCALC, TRIG, CHOLHDL, LDLDIRECT in the last 72 hours. Thyroid Function Tests: No results for input(s): TSH, T4TOTAL, FREET4, T3FREE, THYROIDAB in the last 72 hours. Anemia Panel: No results for input(s): VITAMINB12, FOLATE, FERRITIN, TIBC, IRON, RETICCTPCT in the last 72 hours. Urine analysis:    Component Value Date/Time   COLORURINE YELLOW 11/10/2016 1255   APPEARANCEUR HAZY (A) 11/10/2016 1255   LABSPEC 1.011 11/10/2016 1255   PHURINE 5.0 11/10/2016 1255   GLUCOSEU NEGATIVE 11/10/2016 1255   HGBUR NEGATIVE 11/10/2016 [place] 11/10/2016 1255   KETONESUR NEGATIVE  11/10/2016 1255   PROTEINUR NEGATIVE 11/10/2016 1255   UROBILINOGEN 0.2 01/23/2014 2343   NITRITE NEGATIVE 11/10/2016 1255   LEUKOCYTESUR MODERATE (A) 11/10/2016 1255   Sepsis Labs: @LABRCNTIP (procalcitonin:4,lacticidven:4) ) Recent Results (from the past 240 hour(s))  Urine culture     Status: Abnormal   Collection Time: 11/10/16 12:55 PM  Result Value Ref Range Status   Specimen Description URINE, CATHETERIZED  Final   Special Requests NONE  Final   Culture >=100,000 COLONIES/mL PROTEUS MIRABILIS (A)  Final   Report Status 11/12/2016 FINAL  Final   Organism ID, Bacteria PROTEUS MIRABILIS (A)  Final      Susceptibility   Proteus mirabilis - MIC*    AMPICILLIN <=2 SENSITIVE Sensitive     CEFAZOLIN <=4 SENSITIVE Sensitive     CEFTRIAXONE <=1 SENSITIVE Sensitive     CIPROFLOXACIN <=0.25 SENSITIVE Sensitive     GENTAMICIN <=1 SENSITIVE Sensitive  IMIPENEM 2 SENSITIVE Sensitive     NITROFURANTOIN 256 RESISTANT Resistant     TRIMETH/SULFA <=20 SENSITIVE Sensitive     AMPICILLIN/SULBACTAM <=2 SENSITIVE Sensitive     PIP/TAZO <=4 SENSITIVE Sensitive     * >=100,000 COLONIES/mL PROTEUS MIRABILIS  Surgical pcr screen     Status: Abnormal   Collection Time: 11/10/16 12:56 PM  Result Value Ref Range Status   MRSA, PCR NEGATIVE NEGATIVE Final   Staphylococcus aureus POSITIVE (A) NEGATIVE Final    Comment:        The Xpert SA Assay (FDA approved for NASAL specimens in patients over 30 years of age), is one component of a comprehensive surveillance program.  Test performance has been validated by [facility] for patients greater than or equal to 29 year old. It is not intended to diagnose infection nor to guide or monitor treatment.          Radiology Studies: Dg Hip Port Unilat With Pelvis 1v Right  Result Date: 11/10/2016 CLINICAL DATA:  Status post ORIF EXAM: DG HIP (WITH OR WITHOUT PELVIS) 1V PORT RIGHT COMPARISON:  11/10/2016 FINDINGS: The patient is status post  right hip replacement with anatomic alignment. Pubic symphysis appears intact. Small amount of soft tissue gas lateral to the right hip is felt postsurgical. IMPRESSION: Status post right hip replacement with expected postsurgical changes Electronically Signed   By: Donavan Foil M.D.   On: 11/10/2016 23:13   Dg Femur, Min 2 Views Right  Result Date: 11/11/2016 CLINICAL DATA:  Status post right hip fracture EXAM: RIGHT FEMUR 2 VIEWS COMPARISON:  None. FINDINGS: Status post right hip hemiarthroplasty. No evidence of complication. Mild degenerative changes of the knee. Soft tissue gas overlying the hip, postsurgical. IMPRESSION: Status post right hip hemiarthroplasty, with associated postsurgical changes. Electronically Signed   By: Julian Hy M.D.   On: 11/11/2016 13:44      Scheduled Meds: . atorvastatin  40 mg Oral Daily  . clopidogrel  75 mg Oral Daily  . docusate sodium  100 mg Oral BID  . enoxaparin (LOVENOX) injection  40 mg Subcutaneous Daily  . ferrous sulfate  325 mg Oral TID PC  . insulin aspart  0-9 Units Subcutaneous TID WC  . insulin glargine  8 Units Subcutaneous QHS  . isosorbide mononitrate  15 mg Oral Daily  . memantine  10 mg Oral BID  . metoprolol succinate  12.5 mg Oral Daily  . mirtazapine  15 mg Oral QHS  . mometasone-formoterol  2 puff Inhalation BID  . mupirocin ointment  1 application Nasal BID  . pantoprazole  40 mg Oral Daily  . senna  1 tablet Oral BID   Continuous Infusions:    LOS: 2 days    Time spent in minutes: 31    Baker, MD [facility] Pager: [email or web address] Password [facility] 11/12/2016, 3:34 PM

## 2016-11-12 NOTE — Clinical Social Work Note (Signed)
Unable to confirm/submit new PASARR to [place] [facility]. Information likely incorrect in  [facility] system and will need to be verified/corrected once [facility] reopens on Tues, 12/26.   MSW remains available as needed.   Glendon Axe, MSW [phone] 11/12/2016 11:17 AM

## 2016-11-12 NOTE — Progress Notes (Signed)
Physical Therapy Treatment Patient Details Name: [name] MRN: [id number] DOB: [birth year] Today's Date: 11/12/2016    History of Present Illness Pt is a 75 y.o. female with R hip fracture secondary to fall and now s/p arthroplasty bipolar hip (hemiarthroplasty). Pt has a PMH significant for sleep apnea, dementia, high cholesterol, hypertension, glaucoma, and coronary artery disease. Therapy spoke with PA Angelena Sole who stated there is some concern about a femur fx. he reccomeded TDWB for today until x-rays are obtained.  .    PT Comments    Pt presents with continued confusion this session. Pt continues to require Max a for bed mobs due to pushing backwards and requires assistance to maintain precautions. Attempted gait, but pt does not want to put pressure through RLE with stance phase and requires Mod A to stabilize. Pt will benefit from short term rehab upon discharge in order to maximize functional outcomes.    Follow Up Recommendations  SNF     Equipment Recommendations  Rolling walker with 5" wheels    Recommendations for Other Services       Precautions / Restrictions Precautions Precautions: Posterior Hip Precaution Booklet Issued: Yes (comment) Precaution Comments: reminded pt of precautions, but has dementia Restrictions Weight Bearing Restrictions: Yes RLE Weight Bearing: Weight bearing as tolerated Other Position/Activity Restrictions: upgraded to [place] per MD note    Mobility  Bed Mobility Overal bed mobility: Needs Assistance Bed Mobility: Supine to Sit     Supine to sit: Max assist;+2 for physical assistance     General bed mobility comments: Difficulty getting pt EOB and requires +2 for assistance at trunk and LE's. Therapist must assist at LE's to maintain precautions.   Transfers Overall transfer level: Needs assistance Equipment used: Rolling walker (2 wheeled) Transfers: Sit to/from Stand Sit to Stand: Max assist;+2 physical assistance          General transfer comment: requries assistance to stand and maintain weight bearing through LE's.   Ambulation/Gait Ambulation/Gait assistance: +2 safety/equipment;Mod assist Ambulation Distance (Feet): 5 Feet Assistive device: Rolling walker (2 wheeled) Gait Pattern/deviations: Step-to pattern;Decreased stance time - right;Decreased step length - left;Decreased weight shift to right;Narrow base of support Gait velocity: decreased Gait velocity interpretation: Below normal speed for age/gender General Gait Details: pt requires Mod A for safety with gait for very short distance and requires assistance to stay within precautions.    Stairs            Wheelchair Mobility    Modified Rankin (Stroke Patients Only)       Balance Overall balance assessment: Needs assistance Sitting-balance support: No upper extremity supported;Feet supported Sitting balance-Leahy Scale: Fair     Standing balance support: Bilateral upper extremity supported;During functional activity Standing balance-Leahy Scale: Poor Standing balance comment: relies on RW for stability                    Cognition Arousal/Alertness: Awake/alert Behavior During Therapy: WFL for tasks assessed/performed;Anxious Overall Cognitive Status: History of cognitive impairments - at baseline                 General Comments: Pt continues to ask where she is but is able to recall if asked in return. pt anxious and wants someone to stay wtih her.     Exercises      General Comments        Pertinent Vitals/Pain Pain Assessment: Faces Faces Pain Scale: Hurts even more Pain Location: R hip Pain Descriptors /  Indicators: Grimacing;Guarding;Moaning Pain Intervention(s): Monitored during session;Limited activity within patient's tolerance    Home Living                      Prior Function            PT Goals (current goals can now be found in the care plan section) Acute Rehab  PT Goals Patient Stated Goal: to go home Progress towards PT goals: Progressing toward goals    Frequency    Min 5X/week      PT Plan Current plan remains appropriate    Co-evaluation             End of Session Equipment Utilized During Treatment: Gait belt Activity Tolerance: Patient limited by pain Patient left: in chair;with call bell/phone within reach     Time: [id number] PT Time Calculation (min) (ACUTE ONLY): 19 min  Charges:  $Therapeutic Activity: 8-22 mins                    G Codes:      Scheryl Marten PT, DPT  [phone]  11/12/2016, 3:40 PM

## 2016-11-12 NOTE — Clinical Social Work Placement (Addendum)
   CLINICAL SOCIAL WORK PLACEMENT  NOTE  Date:  11/12/2016  Patient Details  Name: [name] MRN: [id number] Date of Birth: Mar 31, 1941  Clinical Social Work is seeking post-discharge placement for this patient at the [place] level of care (*CSW will initial, date and re-position this form in  chart as items are completed):  Yes   Patient/family provided with [place] Work Department's list of facilities offering this level of care within the geographic area requested by the patient (or if unable, by the patient's family).  Yes   Patient/family informed of their freedom to choose among providers that offer the needed level of care, that participate in Medicare, Medicaid or managed care program needed by the patient, have an available bed and are willing to accept the patient.  Yes   Patient/family informed of [place]'s ownership interest in [facility] and [facility], as well as of the fact that they are under no obligation to receive care at these facilities.  PASRR submitted to EDS on 11/12/16     PASRR number received on 11/12/16     Existing PASRR number confirmed on       FL2 transmitted to all facilities in geographic area requested by pt/family on 11/12/16     FL2 transmitted to all facilities within larger geographic area on       Patient informed that his/her managed care company has contracts with or will negotiate with certain facilities, including the following:            Patient/family informed of bed offers received.  Patient chooses bed at [facility]     Physician recommends and patient chooses bed at      Patient to be transferred to [facility] on 11/16/16.  Patient to be transferred to facility by [facility]     Patient family notified on 11/16/16 of transfer.  Name of family member notified: Benjamine Mola      PHYSICIAN Please sign FL2     Additional Comment:     _______________________________________________ Glendon Axe A 11/12/2016, 11:02 AM

## 2016-11-12 NOTE — Care Management Note (Signed)
75 yo F admitted with R hip fracture secondary to fall. s/p arthroplasty bipolar hip (hemiarthroplasty)  Received referral to assist with [place] and DME.  PT is recommending SNF.  SW to assist with SNF placement.

## 2016-11-12 NOTE — [place] FL2 (Signed)
[place] MEDICAID FL2 LEVEL OF CARE SCREENING TOOL     IDENTIFICATION  Patient Name: TAKENDRA HUGIE Birthdate: 05-21-1941 Sex: female Admission Date (Current Location): 11/10/2016  [facility] [place] Number:  Herbalist and Address:  [facility] [place], [place] [address] [place], [place] [address]      Provider Number: [id number]  Attending Physician Name and Address:  Debbe Odea, MD  Relative Name and Phone Number:       Current Level of Care: Hospital Recommended Level of Care: [place] Prior Approval Number:    Date Approved/Denied:   PASRR Number:    Discharge Plan: SNF    Current Diagnoses: Patient Active Problem List   Diagnosis Date Noted  . Closed right hip fracture ([place]) 11/10/2016  . 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 Obesity (BMI 30-39.9) 10/06/2015  . Tobacco use disorder 03/17/2015  . Cardiomyopathy, ischemic 03/17/2015  . Allergy to ACE inhibitors 03/17/2015  . Ventricular tachycardia ([place]) 03/17/2015  . COPD (chronic obstructive pulmonary disease) ([place]) 12/31/2014  . Chronic systolic heart failure ([place]) 10/12/2013  . Chronic cough 09/12/2013  . Memory deficit 04/23/2013  . Essential hypertension, benign 04/03/2013  . Dyslipidemia 04/03/2013  . Coronary atherosclerosis of native coronary artery 04/03/2013  . DM2 (diabetes mellitus, type 2) ([place]) 04/02/2013  . Fibromyalgia 04/20/2008    Orientation RESPIRATION BLADDER Height & Weight     Self  Normal Incontinent Weight:   Height:     BEHAVIORAL SYMPTOMS/MOOD NEUROLOGICAL BOWEL NUTRITION STATUS   (none )  (none ) Continent Diet (Carb Modified )  AMBULATORY STATUS COMMUNICATION OF NEEDS Skin   Extensive Assist Verbally Surgical wounds                       Personal Care Assistance Level of Assistance  Bathing, Feeding, Dressing Bathing Assistance: Maximum assistance Feeding assistance: Limited assistance Dressing  Assistance: Maximum assistance     Functional Limitations Info  Speech, Hearing, Sight Sight Info: Adequate Hearing Info: Adequate Speech Info: Adequate    SPECIAL CARE FACTORS FREQUENCY  PT (By licensed PT), OT (By licensed OT)     PT Frequency: 5 OT Frequency: 1            Contractures      Additional Factors Info  Code Status, Allergies Code Status Info: DNR CODE  Allergies Info:  Lisinopril, Spironolactone, Vancomycin, Donepezil, Eggs Or Egg-derived Products, Erythromycin, Macrolides And Ketolides, Penicillins, Quinapril Hcl, Angiotensin Receptor Blockers, Lipitor Atorvastatin, Pentazocine Lactate, Propoxyphene Hcl, Sulfonamide Derivatives           Current Medications (11/12/2016):  This is the current hospital active medication list Current Facility-Administered Medications  Medication Dose Route Frequency Provider Last Rate Last Dose  . acetaminophen (TYLENOL) tablet 650 mg  650 mg Oral Q6H PRN Marchia Bond, MD       Or  . acetaminophen (TYLENOL) suppository 650 mg  650 mg Rectal Q6H PRN Marchia Bond, MD      . albuterol (PROVENTIL) (2.5 MG/3ML) 0.083% nebulizer solution 2.5 mg  2.5 mg Inhalation Q6H PRN Samella Parr, NP   2.5 mg at 11/10/16 2235  . alum & mag hydroxide-simeth (MAALOX/MYLANTA) 200-200-20 MG/5ML suspension 30 mL  30 mL Oral Q4H PRN Marchia Bond, MD      . atorvastatin (LIPITOR) tablet 40 mg  40 mg Oral Daily Samella Parr, NP   40 mg at 11/12/16 0925  . bisacodyl (DULCOLAX) suppository  10 mg  10 mg Rectal Daily PRN Marchia Bond, MD      . clopidogrel (PLAVIX) tablet 75 mg  75 mg Oral Daily Debbe Odea, MD   75 mg at 11/12/16 0924  . docusate sodium (COLACE) capsule 100 mg  100 mg Oral BID Marchia Bond, MD   100 mg at 11/12/16 [id number]  . enoxaparin (LOVENOX) injection 40 mg  40 mg Subcutaneous Daily Marchia Bond, MD   40 mg at 11/12/16 0924  . ferrous sulfate tablet 325 mg  325 mg Oral TID PC Marchia Bond, MD   325 mg at 11/12/16 [id number]  .  HYDROcodone-acetaminophen (NORCO/VICODIN) 5-325 MG per tablet 1-2 tablet  1-2 tablet Oral Q6H PRN Marchia Bond, MD   1 tablet at 11/12/16 0415  . insulin aspart (novoLOG) injection 0-9 Units  0-9 Units Subcutaneous TID WC Debbe Odea, MD   2 Units at 11/12/16 0924  . insulin glargine (LANTUS) injection 8 Units  8 Units Subcutaneous QHS Elwin Mocha, MD   8 Units at 11/11/16 2125  . isosorbide mononitrate (IMDUR) 24 hr tablet 15 mg  15 mg Oral Daily Samella Parr, NP   15 mg at 11/12/16 0925  . LORazepam (ATIVAN) tablet 0.5 mg  0.5 mg Oral BID PRN Samella Parr, NP   0.5 mg at 11/11/16 0145  . magnesium citrate solution 1 Bottle  1 Bottle Oral Once PRN Marchia Bond, MD      . memantine [facility]) tablet 10 mg  10 mg Oral BID Samella Parr, NP   10 mg at 11/12/16 0926  . menthol-cetylpyridinium (CEPACOL) lozenge 3 mg  1 lozenge Oral PRN Marchia Bond, MD       Or  . phenol (CHLORASEPTIC) mouth spray 1 spray  1 spray Mouth/Throat PRN Marchia Bond, MD      . metoprolol succinate (TOPROL-XL) 24 hr tablet 12.5 mg  12.5 mg Oral Daily Samella Parr, NP   12.5 mg at 11/12/16 0926  . mirtazapine (REMERON SOL-TAB) disintegrating tablet 15 mg  15 mg Oral QHS Samella Parr, NP   15 mg at 11/11/16 2123  . mometasone-formoterol (DULERA) 100-5 MCG/ACT inhaler 2 puff  2 puff Inhalation BID Samella Parr, NP   2 puff at 11/12/16 [phone]  . morphine 2 MG/ML injection 2 mg  2 mg Intravenous Q2H PRN Marchia Bond, MD      . mupirocin ointment (BACTROBAN) 2 % 1 application  1 application Nasal BID Elwin Mocha, MD   1 application at [id number] [phone]  . ondansetron (ZOFRAN) tablet 4 mg  4 mg Oral Q6H PRN Marchia Bond, MD       Or  . ondansetron [facility]) injection 4 mg  4 mg Intravenous Q6H PRN Marchia Bond, MD      . pantoprazole (PROTONIX) EC tablet 40 mg  40 mg Oral Daily Samella Parr, NP   40 mg at 11/12/16 0926  . polyethylene glycol (MIRALAX / GLYCOLAX) packet 17 g  17 g Oral Daily PRN Marchia Bond, MD      . senna [facility]) tablet 8.6 mg  1 tablet Oral BID Marchia Bond, MD   8.6 mg at 11/12/16 [id number]     Discharge Medications: Please see discharge summary for a list of discharge medications.  Relevant Imaging Results:  Relevant Lab Results:   Additional Information SSN [id number]  Rozell Searing

## 2016-11-12 NOTE — Progress Notes (Signed)
SPORTS MEDICINE AND JOINT REPLACEMENT  Lara Mulch, MD    Carlyon Shadow, PA-C [place], [place], [place]  [address]                             [phone]   PROGRESS NOTE  Subjective:  negative for Chest Pain  negative for Shortness of Breath  negative for Nausea/Vomiting   negative for Calf Pain  negative for Bowel Movement   Tolerating Diet: yes         Patient reports pain as 5 on 0-10 scale.    Objective: Vital signs in last 24 hours:   Patient Vitals for the past 24 hrs:  BP Temp Temp src Pulse Resp SpO2  11/12/16 0748 - - - - - 92 %  11/12/16 0459 (!) 121/44 (!) 100.6 F (38.1 C) Oral (!) 101 16 90 %  11/11/16 2058 - - - - - 95 %  11/11/16 2020 126/65 98.8 F (37.1 C) Oral (!) 101 18 93 %  11/11/16 1500 (!) 126/52 98.3 F (36.8 C) Oral 90 16 98 %    @flow {1959:LAST@   Intake/Output from previous day:   12/23 0701 - 12/24 0700 In: 240 [P.O.:240] Out: -    Intake/Output this shift:   12/24 0701 - 12/24 1900 In: 120 [P.O.:120] Out: -    Intake/Output      12/23 0701 - 12/24 0700 12/24 0701 - 12/25 0700   P.O. 240 120   I.V.     IV Piggyback     Total Intake 240 120   Urine     Blood     Total Output       Net +240 +120        Urine Occurrence 4 x 1 x      LABORATORY DATA:  Recent Labs  11/10/16 0920 11/11/16 0228 11/12/16 0633  WBC 13.0* 13.8* 11.3*  HGB 13.8 11.9* 9.9*  HCT 41.0 35.2* 30.0*  PLT 198 177 169    Recent Labs  11/10/16 0920 11/11/16 0228 11/12/16 0633  NA 138 137 137  K 4.2 4.6 3.8  CL 101 101 102  CO2 28 24 26   BUN 17 17 12   CREATININE 0.93 0.89 0.73  GLUCOSE 164* 227* 216*  CALCIUM 9.5 8.4* 8.1*   Lab Results  Component Value Date   INR 1.04 11/10/2016   INR 1.08 10/06/2015   INR 1.07 03/18/2015    Examination:  General appearance: alert, cooperative and no distress Extremities: extremities normal, atraumatic, no cyanosis or edema  Wound Exam: clean, dry, intact   Drainage:  None:  wound tissue dry  Motor Exam: Quadriceps and Hamstrings Intact  Sensory Exam: Superficial Peroneal, Deep Peroneal and Tibial normal   Assessment:    2 Days Post-Op  Procedure(s) (LRB): ARTHROPLASTY BIPOLAR HIP (HEMIARTHROPLASTY) (Right)  ADDITIONAL DIAGNOSIS:  Principal Problem:   Closed right hip fracture (HCC) Active Problems:   Fibromyalgia   DM2 (diabetes mellitus, type 2) (HCC)   Essential hypertension, benign   Dyslipidemia   Coronary atherosclerosis of native coronary artery   Chronic systolic heart failure (HCC)   COPD (chronic obstructive pulmonary disease) (HCC)   Senile dementia without behavioral disturbance    Plan: Physical Therapy as ordered Weight Bearing as Tolerated (WBAT)  DVT Prophylaxis:  Lovenox  DISCHARGE PLAN: Home  DISCHARGE NEEDS: HHPT   Patient is doing well. Continue plan per medicine  Donia Ast 11/12/2016, 12:34 PM

## 2016-11-13 DIAGNOSIS — R5081 Fever presenting with conditions classified elsewhere: Secondary | ICD-10-CM

## 2016-11-13 LAB — GLUCOSE, CAPILLARY
GLUCOSE-CAPILLARY: 202 mg/dL — AB (ref 65–99)
GLUCOSE-CAPILLARY: 179 mg/dL — AB (ref 65–99)
GLUCOSE-CAPILLARY: 230 mg/dL — AB (ref 65–99)
GLUCOSE-CAPILLARY: 218 mg/dL — AB (ref 65–99)

## 2016-11-13 LAB — CBC AND DIFFERENTIAL
HCT: 31 % — AB (ref 36–46)
Hemoglobin: 10.2 g/dL — AB (ref 12.0–16.0)
Platelets: 135 10*3/uL — AB (ref 150–399)
WBC: 12.1 10^3/mL

## 2016-11-13 LAB — URINALYSIS, ROUTINE W REFLEX MICROSCOPIC
Bilirubin Urine: NEGATIVE
GLUCOSE, UA: 50 mg/dL — AB
HGB URINE DIPSTICK: NEGATIVE
KETONES UR: NEGATIVE mg/dL
LEUKOCYTES UA: NEGATIVE
Nitrite: NEGATIVE
PROTEIN: NEGATIVE mg/dL
Specific Gravity, Urine: 1.017 (ref 1.005–1.030)
pH: 5 (ref 5.0–8.0)

## 2016-11-13 LAB — CBC
HCT: 31.1 % — ABNORMAL LOW (ref 36.0–46.0)
HEMOGLOBIN: 10.2 g/dL — AB (ref 12.0–15.0)
MCH: 33 pg (ref 26.0–34.0)
MCHC: 32.8 g/dL (ref 30.0–36.0)
MCV: 100.6 fL — ABNORMAL HIGH (ref 78.0–100.0)
PLATELETS: 135 10*3/uL — AB (ref 150–400)
RBC: 3.09 MIL/uL — AB (ref 3.87–5.11)
RDW: 13.4 % (ref 11.5–15.5)
WBC: 12.1 10*3/uL — ABNORMAL HIGH (ref 4.0–10.5)

## 2016-11-13 MED ORDER — INSULIN ASPART 100 UNIT/ML ~~LOC~~ SOLN
0.0000 [IU] | Freq: Three times a day (TID) | SUBCUTANEOUS | Status: DC
Start: 2016-11-13 — End: 2016-11-16
  Administered 2016-11-13 (×2): 3 [IU] via SUBCUTANEOUS
  Administered 2016-11-13 – 2016-11-14 (×2): 5 [IU] via SUBCUTANEOUS
  Administered 2016-11-14 (×2): 3 [IU] via SUBCUTANEOUS
  Administered 2016-11-15: 5 [IU] via SUBCUTANEOUS
  Administered 2016-11-15 (×2): 3 [IU] via SUBCUTANEOUS
  Administered 2016-11-16: 5 [IU] via SUBCUTANEOUS
  Administered 2016-11-16: 3 [IU] via SUBCUTANEOUS

## 2016-11-13 MED ORDER — INSULIN GLARGINE 100 UNIT/ML ~~LOC~~ SOLN
12.0000 [IU] | Freq: Every day | SUBCUTANEOUS | Status: DC
  Administered 2016-11-13 – 2016-11-15 (×3): 12 [IU] via SUBCUTANEOUS
  Filled 2016-11-13 (×4): qty 0.12

## 2016-11-13 MED ORDER — INSULIN ASPART 100 UNIT/ML ~~LOC~~ SOLN
0.0000 [IU] | Freq: Every day | SUBCUTANEOUS | Status: DC
  Administered 2016-11-13 – 2016-11-15 (×2): 2 [IU] via SUBCUTANEOUS

## 2016-11-13 MED ORDER — HYDROCODONE-ACETAMINOPHEN 5-325 MG PO TABS
1.0000 | ORAL_TABLET | Freq: Four times a day (QID) | ORAL | Status: DC | PRN
  Administered 2016-11-13 – 2016-11-15 (×6): 1 via ORAL
  Filled 2016-11-13 (×6): qty 1

## 2016-11-13 MED ORDER — ASPIRIN EC 81 MG PO TBEC
81.0000 mg | DELAYED_RELEASE_TABLET | Freq: Every day | ORAL | Status: DC
  Administered 2016-11-13 – 2016-11-14 (×2): 81 mg via ORAL
  Filled 2016-11-13: qty 1

## 2016-11-13 NOTE — Progress Notes (Signed)
PROGRESS NOTE    [name]  [id number] DOB: Feb 26, 1941 DOA: 11/10/2016  PCP: Vidal Schwalbe, MD   Brief Narrative:  [name] is a 75 y.o. female with medical history significant for COPD and tobacco abuse, dementia, chronic systolic heart failure, CAD with last cardiac catheterization 2016 demonstrating patent stent, and fibromyalgia. Patient with mechanical fall and dining hall facility today without loss of consciousness. Reported right hip pain also sustained small contusion to right posterior head.  Subjective: Pleasantly confused- no complaints today.   Assessment & Plan:    Closed right hip fracture  -Patient presents with acute impacted subcapital fracture of the right hip requiring operative intervention - s/p right hemiarthroplasty on 12/22 - management per ortho- per PT, will need SNF  Active Problems:   Coronary atherosclerosis of native coronary artery -Underwent Rotablator with drug-eluting stent 2 to RCA in 2012 with catheterization in 2016 demonstrating patent stent - on aspirin and plavix per cardiology [facility]) when last assessed in 3/17 -   resumed Plavix- no signs of bleeding- can resume Aspirin today  - continue  Imdur, Lipitor and beta blocker   Low grade fever 100.6 on 12/23 night - possibly related to surgery- following- has not recurred - f/u clean catch or I and O cath collected UA   - no resp symptoms today -    Anemia - due to acute blood loss? Ortho has started Iron replacement - baseline Hb 13-14 >> 11.9 >> 9.9>> 10.2    DM2 (diabetes mellitus, type 2)  -  Lantus and SSI- sugars elevated- increase Lantus today -Hold metformin    Essential hypertension, benign -Current blood pressure controlled -Continue Toprol    Chronic grade 1 Diastolic dysfunction -Most recent echocardiogram demonstrates preserved LV function but patient does have mild grade 1 diastolic dysfunction which is compensated  - holding lasix for now   COPD (chronic obstructive pulmonary disease)  -Compensated without wheezing -Continue preadmission MDIs    Fibromyalgia -Preadmission was on low-dose Percocet every 6 hours PRN    Dyslipidemia -Continue Lipitor    Senile dementia without behavioral disturbance -Continue Namenda and Remeron -Monitoring for acute delirium- appears stable  DVT prophylaxis: Lovenox Code Status: DNR Family Communication:  Disposition Plan: SNF Consultants:   ortho Procedures:   R hip hemiarthroplasty  Antimicrobials:  Anti-infectives    Start     Dose/Rate Route Frequency Ordered Stop   11/11/16 1830  ceFAZolin (ANCEF) IVPB 2g/100 mL premix     2 g 200 mL/hr over 30 Minutes Intravenous On call to O.R. 11/10/16 1218 11/10/16 1930   11/11/16 0300  ceFAZolin (ANCEF) IVPB 2g/100 mL premix     2 g 200 mL/hr over 30 Minutes Intravenous Every 6 hours 11/11/16 0159 11/11/16 0902       Objective: Vitals:   11/12/16 0748 11/12/16 1300 11/12/16 1945 11/13/16 0447  BP:  (!) 128/58 (!) 122/53 120/64  Pulse:  98 94 90  Resp:  16 16 16   Temp:  97.5 F (36.4 C) 98.2 F (36.8 C) 98 F (36.7 C)  TempSrc:  Oral Oral Oral  SpO2: 92% 95% 94% 95%  Weight:   72.8 kg (160 lb 7.9 oz)     Intake/Output Summary (Last 24 hours) at 11/13/16 0838 Last data filed at 11/12/16 1700  Gross per 24 hour  Intake              360 ml  Output  0 ml  Net              360 ml   Filed Weights   11/12/16 1945  Weight: 72.8 kg (160 lb 7.9 oz)    Examination: General exam: Appears comfortable HEENT: PERRLA, oral mucosa moist, no sclera icterus or thrush Respiratory system: Clear to auscultation. Respiratory effort normal. Cardiovascular system: S1 & S2 heard, RRR.  No murmurs  Gastrointestinal system: Abdomen soft, non-tender, nondistended. Normal bowel sound. No organomegaly Central nervous system: Alert - confused to time and place-  No focal neurological deficits. Extremities: No cyanosis,  clubbing - left hip swollen Skin: No rashes or ulcers Psychiatry:  Mood & affect appropriate.     Data Reviewed: I have personally reviewed following labs and imaging studies  CBC:  Recent Labs Lab 11/10/16 0920 11/11/16 0228 11/12/16 0633 11/13/16 0508  WBC 13.0* 13.8* 11.3* 12.1*  NEUTROABS 10.3*  --   --   --   HGB 13.8 11.9* 9.9* 10.2*  HCT 41.0 35.2* 30.0* 31.1*  MCV 98.1 97.2 97.7 100.6*  PLT 198 177 169 [id number]*   Basic Metabolic Panel:  Recent Labs Lab 11/10/16 0920 11/11/16 0228 11/12/16 0633  NA 138 137 137  K 4.2 4.6 3.8  CL 101 101 102  CO2 28 24 26   GLUCOSE 164* 227* 216*  BUN 17 17 12   CREATININE 0.93 0.89 0.73  CALCIUM 9.5 8.4* 8.1*   GFR: Estimated Creatinine Clearance: 60.7 mL/min (by C-G formula based on SCr of 0.73 mg/dL). Liver Function Tests: No results for input(s): AST, ALT, ALKPHOS, BILITOT, PROT, ALBUMIN in the last 168 hours. No results for input(s): LIPASE, AMYLASE in the last 168 hours. No results for input(s): AMMONIA in the last 168 hours. Coagulation Profile:  Recent Labs Lab 11/10/16 0920  INR 1.04   Cardiac Enzymes: No results for input(s): CKTOTAL, CKMB, CKMBINDEX, TROPONINI in the last 168 hours. BNP (last 3 results) No results for input(s): PROBNP in the last 8760 hours. HbA1C: No results for input(s): HGBA1C in the last 72 hours. CBG:  Recent Labs Lab 11/12/16 0907 11/12/16 1145 11/12/16 1558 11/12/16 2107 11/13/16 0637  GLUCAP 173* 183* 220* 238* 202*   Lipid Profile: No results for input(s): CHOL, HDL, LDLCALC, TRIG, CHOLHDL, LDLDIRECT in the last 72 hours. Thyroid Function Tests: No results for input(s): TSH, T4TOTAL, FREET4, T3FREE, THYROIDAB in the last 72 hours. Anemia Panel: No results for input(s): VITAMINB12, FOLATE, FERRITIN, TIBC, IRON, RETICCTPCT in the last 72 hours. Urine analysis:    Component Value Date/Time   COLORURINE YELLOW 11/10/2016 1255   APPEARANCEUR HAZY (A) 11/10/2016 1255    LABSPEC 1.011 11/10/2016 1255   PHURINE 5.0 11/10/2016 1255   GLUCOSEU NEGATIVE 11/10/2016 1255   HGBUR NEGATIVE 11/10/2016 [place] 11/10/2016 [place] 11/10/2016 1255   PROTEINUR NEGATIVE 11/10/2016 1255   UROBILINOGEN 0.2 01/23/2014 2343   NITRITE NEGATIVE 11/10/2016 1255   LEUKOCYTESUR MODERATE (A) 11/10/2016 1255   Sepsis Labs: @LABRCNTIP (procalcitonin:4,lacticidven:4) ) Recent Results (from the past 240 hour(s))  Urine culture     Status: Abnormal   Collection Time: 11/10/16 12:55 PM  Result Value Ref Range Status   Specimen Description URINE, CATHETERIZED  Final   Special Requests NONE  Final   Culture >=100,000 COLONIES/mL PROTEUS MIRABILIS (A)  Final   Report Status 11/12/2016 FINAL  Final   Organism ID, Bacteria PROTEUS MIRABILIS (A)  Final      Susceptibility   Proteus mirabilis -  MIC*    AMPICILLIN <=2 SENSITIVE Sensitive     CEFAZOLIN <=4 SENSITIVE Sensitive     CEFTRIAXONE <=1 SENSITIVE Sensitive     CIPROFLOXACIN <=0.25 SENSITIVE Sensitive     GENTAMICIN <=1 SENSITIVE Sensitive     IMIPENEM 2 SENSITIVE Sensitive     NITROFURANTOIN 256 RESISTANT Resistant     TRIMETH/SULFA <=20 SENSITIVE Sensitive     AMPICILLIN/SULBACTAM <=2 SENSITIVE Sensitive     PIP/TAZO <=4 SENSITIVE Sensitive     * >=100,000 COLONIES/mL PROTEUS MIRABILIS  Surgical pcr screen     Status: Abnormal   Collection Time: 11/10/16 12:56 PM  Result Value Ref Range Status   MRSA, PCR NEGATIVE NEGATIVE Final   Staphylococcus aureus POSITIVE (A) NEGATIVE Final    Comment:        The Xpert SA Assay (FDA approved for NASAL specimens in patients over 22 years of age), is one component of a comprehensive surveillance program.  Test performance has been validated by [facility] for patients greater than or equal to 84 year old. It is not intended to diagnose infection nor to guide or monitor treatment.          Radiology Studies: Dg Femur, Min 2 Views  Right  Result Date: 11/11/2016 CLINICAL DATA:  Status post right hip fracture EXAM: RIGHT FEMUR 2 VIEWS COMPARISON:  None. FINDINGS: Status post right hip hemiarthroplasty. No evidence of complication. Mild degenerative changes of the knee. Soft tissue gas overlying the hip, postsurgical. IMPRESSION: Status post right hip hemiarthroplasty, with associated postsurgical changes. Electronically Signed   By: Julian Hy M.D.   On: 11/11/2016 13:44      Scheduled Meds: . atorvastatin  40 mg Oral Daily  . clopidogrel  75 mg Oral Daily  . docusate sodium  100 mg Oral BID  . enoxaparin (LOVENOX) injection  40 mg Subcutaneous Daily  . ferrous sulfate  325 mg Oral TID PC  . insulin aspart  0-15 Units Subcutaneous TID WC  . insulin aspart  0-5 Units Subcutaneous QHS  . insulin glargine  12 Units Subcutaneous QHS  . isosorbide mononitrate  15 mg Oral Daily  . memantine  10 mg Oral BID  . metoprolol succinate  12.5 mg Oral Daily  . mirtazapine  15 mg Oral QHS  . mometasone-formoterol  2 puff Inhalation BID  . mupirocin ointment  1 application Nasal BID  . pantoprazole  40 mg Oral Daily  . senna  1 tablet Oral BID   Continuous Infusions:    LOS: 3 days    Time spent in minutes: 21    [place], MD [facility] Pager: [email or web address] Password [facility] 11/13/2016, 8:38 AM

## 2016-11-13 NOTE — Progress Notes (Signed)
SPORTS MEDICINE AND JOINT REPLACEMENT  Lara Mulch, MD    Carlyon Shadow, PA-C [place], [place], [place]  [address]                             [phone]   PROGRESS NOTE  Subjective:  negative for Chest Pain  negative for Shortness of Breath  negative for Nausea/Vomiting   negative for Calf Pain  negative for Bowel Movement   Tolerating Diet: yes         Patient reports pain as 4 on 0-10 scale.    Objective: Vital signs in last 24 hours:   Patient Vitals for the past 24 hrs:  BP Temp Temp src Pulse Resp SpO2 Weight  11/13/16 0447 120/64 98 F (36.7 C) Oral 90 16 95 % -  11/12/16 1945 (!) 122/53 98.2 F (36.8 C) Oral 94 16 94 % 72.8 kg (160 lb 7.9 oz)  11/12/16 1300 (!) 128/58 97.5 F (36.4 C) Oral 98 16 95 % -    @flow {1959:LAST@   Intake/Output from previous day:   12/24 0701 - 12/25 0700 In: 360 [P.O.:360] Out: -    Intake/Output this shift:   No intake/output data recorded.   Intake/Output      12/24 0701 - 12/25 0700 12/25 0701 - 12/26 0700   P.O. 360    Total Intake(mL/kg) 360 (4.9)    Net +360          Urine Occurrence 2 x       LABORATORY DATA:  Recent Labs  11/10/16 0920 11/11/16 0228 11/12/16 0633 11/13/16 0508  WBC 13.0* 13.8* 11.3* 12.1*  HGB 13.8 11.9* 9.9* 10.2*  HCT 41.0 35.2* 30.0* 31.1*  PLT 198 177 169 135*    Recent Labs  11/10/16 0920 11/11/16 0228 11/12/16 0633  NA 138 137 137  K 4.2 4.6 3.8  CL 101 101 102  CO2 28 24 26   BUN 17 17 12   CREATININE 0.93 0.89 0.73  GLUCOSE 164* 227* 216*  CALCIUM 9.5 8.4* 8.1*   Lab Results  Component Value Date   INR 1.04 11/10/2016   INR 1.08 10/06/2015   INR 1.07 03/18/2015    Examination:  General appearance: alert, cooperative and no distress Extremities: extremities normal, atraumatic, no cyanosis or edema  Wound Exam: clean, dry, intact   Drainage:  None: wound tissue dry  Motor Exam: Quadriceps and Hamstrings Intact  Sensory Exam: Superficial  Peroneal, Deep Peroneal and Tibial normal   Assessment:    3 Days Post-Op  Procedure(s) (LRB): ARTHROPLASTY BIPOLAR HIP (HEMIARTHROPLASTY) (Right)  ADDITIONAL DIAGNOSIS:  Principal Problem:   Closed right hip fracture (HCC) Active Problems:   Fibromyalgia   DM2 (diabetes mellitus, type 2) (HCC)   Essential hypertension, benign   Dyslipidemia   Coronary atherosclerosis of native coronary artery   COPD (chronic obstructive pulmonary disease) (HCC)   Senile dementia without behavioral disturbance   Chronic diastolic CHF (congestive heart failure) (HCC)     Plan: Physical Therapy as ordered Weight Bearing as Tolerated (WBAT)  DVT Prophylaxis:  Lovenox  DISCHARGE PLAN: Skilled Nursing Facility/Rehab  DISCHARGE NEEDS: HHPT   Patient is pleasantly confused and progressing well from an ortho standpoint. Continue to work with PT (expect D/C to SNF) and continue treatment per medicine         Donia Ast 11/13/2016, 7:48 AM

## 2016-11-14 ENCOUNTER — Encounter (HOSPITAL_COMMUNITY): Payer: Self-pay | Admitting: Orthopedic Surgery

## 2016-11-14 DIAGNOSIS — E785 Hyperlipidemia, unspecified: Secondary | ICD-10-CM

## 2016-11-14 DIAGNOSIS — D62 Acute posthemorrhagic anemia: Secondary | ICD-10-CM

## 2016-11-14 LAB — GLUCOSE, CAPILLARY
GLUCOSE-CAPILLARY: 200 mg/dL — AB (ref 65–99)
Glucose-Capillary: 171 mg/dL — ABNORMAL HIGH (ref 65–99)
Glucose-Capillary: 219 mg/dL — ABNORMAL HIGH (ref 65–99)
Glucose-Capillary: 176 mg/dL — ABNORMAL HIGH (ref 65–99)

## 2016-11-14 NOTE — Progress Notes (Signed)
Physical Therapy Treatment Patient Details Name: [name] MRN: [id number] DOB: 08/23/1941 Today's Date: 11/14/2016    History of Present Illness Pt is a 75 y.o. female with R hip fracture secondary to fall and now s/p arthroplasty bipolar hip (hemiarthroplasty). Pt has a PMH significant for sleep apnea, dementia, high cholesterol, hypertension, glaucoma, and coronary artery disease. Therapy spoke with PA Angelena Sole who stated there is some concern about a femur fx. he reccomeded TDWB for today until x-rays are obtained.  .    PT Comments    Pt demonstrates improved weight bearing and weight shift to RLE with gait activities. Pt is able to perform 25' of gait this session with WC follow and is possibly limited by pain. Pt has increased dyspnea with gait. Improved ability to perform transfers and bed mobs noted this session.   Follow Up Recommendations  SNF     Equipment Recommendations  Rolling walker with 5" wheels    Recommendations for Other Services       Precautions / Restrictions Precautions Precautions: Posterior Hip Precaution Booklet Issued: Yes (comment) Precaution Comments: reminded pt of precautions, but has dementia Restrictions Weight Bearing Restrictions: Yes RLE Weight Bearing: Weight bearing as tolerated    Mobility  Bed Mobility Overal bed mobility: Needs Assistance Bed Mobility: Supine to Sit     Supine to sit: +2 for physical assistance;Mod assist     General bed mobility comments: Improved ability to get EOB with assistance for RLE and to maintain hip precautions.   Transfers Overall transfer level: Needs assistance Equipment used: Rolling walker (2 wheeled) Transfers: Sit to/from Stand Sit to Stand: Mod assist;+2 safety/equipment         General transfer comment: requries assistance to stand and maintain weight bearing through LE's.   Ambulation/Gait Ambulation/Gait assistance: Min assist;+2 safety/equipment Ambulation Distance  (Feet): 25 Feet Assistive device: Rolling walker (2 wheeled) Gait Pattern/deviations: Step-to pattern;Decreased stance time - right;Decreased step length - left;Decreased weight shift to right;Narrow base of support Gait velocity: decreased Gait velocity interpretation: Below normal speed for age/gender General Gait Details: Min A for gait within room with recliner follow. Improved weigh shift to RLE and improved gait distance   Stairs            Wheelchair Mobility    Modified Rankin (Stroke Patients Only)       Balance                                    Cognition Arousal/Alertness: Awake/alert Behavior During Therapy: WFL for tasks assessed/performed;Anxious Overall Cognitive Status: History of cognitive impairments - at baseline                 General Comments: Pt continues to ask where she is but is able to recall if asked in return. pt anxious and wants someone to stay wtih her.     Exercises Total Joint Exercises Ankle Circles/Pumps: AROM;Both;20 reps;Supine Quad Sets: AROM;Right;10 reps;Supine    General Comments        Pertinent Vitals/Pain Pain Assessment: Faces Faces Pain Scale: Hurts little more Pain Location: R hip with mobility Pain Descriptors / Indicators: Grimacing;Guarding;Moaning Pain Intervention(s): Monitored during session;Premedicated before session;Repositioned    Home Living                      Prior Function  PT Goals (current goals can now be found in the care plan section) Acute Rehab PT Goals Patient Stated Goal: to go home Progress towards PT goals: Progressing toward goals    Frequency    Min 3X/week      PT Plan Frequency needs to be updated    Co-evaluation             End of Session Equipment Utilized During Treatment: Gait belt Activity Tolerance: Patient tolerated treatment well Patient left: in chair;with call bell/phone within reach     Time: [id number] PT  Time Calculation (min) (ACUTE ONLY): 17 min  Charges:  $Gait Training: 8-22 mins                    G Codes:      Scheryl Marten PT, DPT  [phone]  11/14/2016, 12:34 PM

## 2016-11-14 NOTE — Care Management Important Message (Signed)
Important Message  Patient Details  Name: [name] MRN: [id number] Date of Birth: [birth year]   Medicare Important Message Given:  Yes    Orbie Pyo 11/14/2016, 2:12 PM

## 2016-11-14 NOTE — Progress Notes (Signed)
Administrative staff suggested pt would like someone to talk to, but she was asleep. Will f/u soon.   11/14/16 1600  Clinical Encounter Type  Visited With Patient not available  Visit Type Initial  Referral From Nurse   Gerrit Heck, Chaplain

## 2016-11-14 NOTE — Clinical Social Work Note (Signed)
Pt needs a [facility] number in order to go to SNF. [facility] is stating that the pt's identifying information does not match their records therefore CSW will have to call and determine what is incorrect however, [facility] call center does not open until Thursday. CSW will contact [facility] Thursday morning.  [address], [place]

## 2016-11-14 NOTE — Progress Notes (Signed)
PROGRESS NOTE    TIANNAH PAGANINI  [id number] DOB: Mar 13, 1941 DOA: 11/10/2016  PCP: Vidal Schwalbe, MD   Brief Narrative:  [name] is a 75 y.o. female with medical history significant for COPD and tobacco abuse, dementia, chronic systolic heart failure, CAD with last cardiac catheterization 2016 demonstrating patent stent, and fibromyalgia. Patient with mechanical fall and dining hall facility today without loss of consciousness. Reported right hip pain also sustained small contusion to right posterior head.  Subjective: Pleasantly confused- no complaints other than needing to get up to use the bathroom and not liking being alone in her room.   Assessment & Plan:    Closed right hip fracture  -Patient presents with acute impacted subcapital fracture of the right hip requiring operative intervention - s/p right hemiarthroplasty on 12/22 - management per ortho - per PT, will need SNF- awaiting a [facility]AR which could take until Thursday or Friday (minimum) per social work.  Active Problems:   Coronary atherosclerosis of native coronary artery -Underwent Rotablator with drug-eluting stent 2 to RCA in 2012 with catheterization in 2016 demonstrating patent stent - on aspirin and plavix per cardiology [facility]) when last assessed in 3/17 -   resumed Plavix- no signs of bleeding-  resumed Aspirin 12/25   - continue  Imdur, Lipitor and beta blocker   Low grade fever 100.6 on 12/23 night - possibly related to surgery- following- has not recurred - UA done yesterday- negative  - no resp symptoms     Anemia - due to acute blood loss? Ortho has started Iron replacement - baseline Hb 13-14 >> 11.9 >> 9.9>> 10.2 on 12/25    DM2 (diabetes mellitus, type 2)  -  Lantus and SSI- sugars elevated- increased Lantus 12/25 with improvement -Hold metformin    Essential hypertension, benign -Current blood pressure controlled -Continue Toprol    Chronic grade 1 Diastolic  dysfunction -Most recent echocardiogram demonstrates preserved LV function but patient does have mild grade 1 diastolic dysfunction which is compensated  - holding lasix for now    COPD (chronic obstructive pulmonary disease)  -Compensated without wheezing -Continue preadmission MDIs    Fibromyalgia -Preadmission was on low-dose Percocet every 6 hours PRN    Dyslipidemia -Continue Lipitor    Senile dementia without behavioral disturbance -Continue Namenda and Remeron -Monitoring for acute delirium- appears stable  DVT prophylaxis: Lovenox Code Status: DNR Family Communication:  Disposition Plan: SNF Consultants:   ortho Procedures:   R hip hemiarthroplasty  Antimicrobials:  Anti-infectives    Start     Dose/Rate Route Frequency Ordered Stop   11/11/16 1830  ceFAZolin (ANCEF) IVPB 2g/100 mL premix     2 g 200 mL/hr over 30 Minutes Intravenous On call to O.R. 11/10/16 1218 11/10/16 1930   11/11/16 0300  ceFAZolin (ANCEF) IVPB 2g/100 mL premix     2 g 200 mL/hr over 30 Minutes Intravenous Every 6 hours 11/11/16 0159 11/11/16 0902       Objective: Vitals:   11/13/16 1459 11/13/16 1900 11/13/16 2100 11/14/16 0615  BP: 136/60  131/61 (!) 122/50  Pulse: 94  93 91  Resp: 16  16 16   Temp: 98.7 F (37.1 C)  98.6 F (37 C) 99.1 F (37.3 C)  TempSrc: Oral  Oral Oral  SpO2: 98%  94% 92%  Weight:  73.5 kg (162 lb 0.6 oz)      Intake/Output Summary (Last 24 hours) at 11/14/16 0959 Last data filed at 11/13/16 1300  Gross per 24  hour  Intake                0 ml  Output              150 ml  Net             -150 ml   Filed Weights   11/12/16 1945 11/13/16 1900  Weight: 72.8 kg (160 lb 7.9 oz) 73.5 kg (162 lb 0.6 oz)    Examination: General exam: Appears comfortable HEENT: PERRLA, oral mucosa moist, no sclera icterus or thrush Respiratory system: Clear to auscultation. Respiratory effort normal. Cardiovascular system: S1 & S2 heard, RRR.  No murmurs   Gastrointestinal system: Abdomen soft, non-tender, nondistended. Normal bowel sound. No organomegaly Central nervous system: Alert - confused to time and place-  No focal neurological deficits. Extremities: No cyanosis, clubbing - left hip swollen Skin: No rashes or ulcers Psychiatry:  Mood & affect appropriate.     Data Reviewed: I have personally reviewed following labs and imaging studies  CBC:  Recent Labs Lab 11/10/16 0920 11/11/16 0228 11/12/16 0633 11/13/16 0508  WBC 13.0* 13.8* 11.3* 12.1*  NEUTROABS 10.3*  --   --   --   HGB 13.8 11.9* 9.9* 10.2*  HCT 41.0 35.2* 30.0* 31.1*  MCV 98.1 97.2 97.7 100.6*  PLT 198 177 169 [id number]*   Basic Metabolic Panel:  Recent Labs Lab 11/10/16 0920 11/11/16 0228 11/12/16 0633  NA 138 137 137  K 4.2 4.6 3.8  CL 101 101 102  CO2 28 24 26   GLUCOSE 164* 227* 216*  BUN 17 17 12   CREATININE 0.93 0.89 0.73  CALCIUM 9.5 8.4* 8.1*   GFR: Estimated Creatinine Clearance: 61 mL/min (by C-G formula based on SCr of 0.73 mg/dL). Liver Function Tests: No results for input(s): AST, ALT, ALKPHOS, BILITOT, PROT, ALBUMIN in the last 168 hours. No results for input(s): LIPASE, AMYLASE in the last 168 hours. No results for input(s): AMMONIA in the last 168 hours. Coagulation Profile:  Recent Labs Lab 11/10/16 0920  INR 1.04   Cardiac Enzymes: No results for input(s): CKTOTAL, CKMB, CKMBINDEX, TROPONINI in the last 168 hours. BNP (last 3 results) No results for input(s): PROBNP in the last 8760 hours. HbA1C: No results for input(s): HGBA1C in the last 72 hours. CBG:  Recent Labs Lab 11/13/16 0637 11/13/16 1158 11/13/16 1601 11/13/16 2112 11/14/16 0623  GLUCAP 202* 179* 230* 218* 171*   Lipid Profile: No results for input(s): CHOL, HDL, LDLCALC, TRIG, CHOLHDL, LDLDIRECT in the last 72 hours. Thyroid Function Tests: No results for input(s): TSH, T4TOTAL, FREET4, T3FREE, THYROIDAB in the last 72 hours. Anemia Panel: No results  for input(s): VITAMINB12, FOLATE, FERRITIN, TIBC, IRON, RETICCTPCT in the last 72 hours. Urine analysis:    Component Value Date/Time   COLORURINE YELLOW 11/13/2016 1325   APPEARANCEUR CLEAR 11/13/2016 1325   LABSPEC 1.017 11/13/2016 1325   PHURINE 5.0 11/13/2016 1325   GLUCOSEU 50 (A) 11/13/2016 1325   HGBUR NEGATIVE 11/13/2016 1325   BILIRUBINUR NEGATIVE 11/13/2016 1325   KETONESUR NEGATIVE 11/13/2016 1325   PROTEINUR NEGATIVE 11/13/2016 1325   UROBILINOGEN 0.2 01/23/2014 2343   NITRITE NEGATIVE 11/13/2016 1325   LEUKOCYTESUR NEGATIVE 11/13/2016 1325   Sepsis Labs: @LABRCNTIP (procalcitonin:4,lacticidven:4) ) Recent Results (from the past 240 hour(s))  Urine culture     Status: Abnormal   Collection Time: 11/10/16 12:55 PM  Result Value Ref Range Status   Specimen Description URINE, CATHETERIZED  Final   Special Requests NONE  Final   Culture >=100,000 COLONIES/mL PROTEUS MIRABILIS (A)  Final   Report Status 11/12/2016 FINAL  Final   Organism ID, Bacteria PROTEUS MIRABILIS (A)  Final      Susceptibility   Proteus mirabilis - MIC*    AMPICILLIN <=2 SENSITIVE Sensitive     CEFAZOLIN <=4 SENSITIVE Sensitive     CEFTRIAXONE <=1 SENSITIVE Sensitive     CIPROFLOXACIN <=0.25 SENSITIVE Sensitive     GENTAMICIN <=1 SENSITIVE Sensitive     IMIPENEM 2 SENSITIVE Sensitive     NITROFURANTOIN 256 RESISTANT Resistant     TRIMETH/SULFA <=20 SENSITIVE Sensitive     AMPICILLIN/SULBACTAM <=2 SENSITIVE Sensitive     PIP/TAZO <=4 SENSITIVE Sensitive     * >=100,000 COLONIES/mL PROTEUS MIRABILIS  Surgical pcr screen     Status: Abnormal   Collection Time: 11/10/16 12:56 PM  Result Value Ref Range Status   MRSA, PCR NEGATIVE NEGATIVE Final   Staphylococcus aureus POSITIVE (A) NEGATIVE Final    Comment:        The Xpert SA Assay (FDA approved for NASAL specimens in patients over 71 years of age), is one component of a comprehensive surveillance program.  Test performance has been  validated by [facility] for patients greater than or equal to 57 year old. It is not intended to diagnose infection nor to guide or monitor treatment.          Radiology Studies: No results found.    Scheduled Meds: . aspirin EC  81 mg Oral Daily  . atorvastatin  40 mg Oral Daily  . clopidogrel  75 mg Oral Daily  . docusate sodium  100 mg Oral BID  . enoxaparin (LOVENOX) injection  40 mg Subcutaneous Daily  . ferrous sulfate  325 mg Oral TID PC  . insulin aspart  0-15 Units Subcutaneous TID WC  . insulin aspart  0-5 Units Subcutaneous QHS  . insulin glargine  12 Units Subcutaneous QHS  . isosorbide mononitrate  15 mg Oral Daily  . memantine  10 mg Oral BID  . metoprolol succinate  12.5 mg Oral Daily  . mirtazapine  15 mg Oral QHS  . mometasone-formoterol  2 puff Inhalation BID  . mupirocin ointment  1 application Nasal BID  . pantoprazole  40 mg Oral Daily  . senna  1 tablet Oral BID   Continuous Infusions:    LOS: 4 days    Time spent in minutes: 96    [place], MD [facility] Pager: [email or web address] Password [email or web address] 11/14/2016, 9:59 AM

## 2016-11-14 NOTE — Progress Notes (Addendum)
     Subjective:  Patient reports pain as mild.  Interactive in bed, no complaints, her mental status seems to be at baseline.  Objective:   VITALS:   Vitals:   11/13/16 2100 11/14/16 0615 11/14/16 1025 11/14/16 1324  BP: 131/61 (!) 122/50  (!) 103/51  Pulse: 93 91  86  Resp: 16 16  16   Temp: 98.6 F (37 C) 99.1 F (37.3 C)  98.4 F (36.9 C)  TempSrc: Oral Oral    SpO2: 94% 92% 91% 94%  Weight:        Neurologically intact Dorsiflexion/Plantar flexion intact Incision: dressing C/D/I   Lab Results  Component Value Date   WBC 12.1 (H) 11/13/2016   HGB 10.2 (L) 11/13/2016   HCT 31.1 (L) 11/13/2016   MCV 100.6 (H) 11/13/2016   PLT 135 (L) 11/13/2016   BMET    Component Value Date/Time   NA 137 11/12/2016 0633   K 3.8 11/12/2016 0633   CL 102 11/12/2016 0633   CO2 26 11/12/2016 0633   GLUCOSE 216 (H) 11/12/2016 0633   BUN 12 11/12/2016 0633   CREATININE 0.73 11/12/2016 0633   CALCIUM 8.1 (L) 11/12/2016 0633   GFRNONAA >60 11/12/2016 0633   GFRAA >60 11/12/2016 [id number]     Assessment/Plan: 4 Days Post-Op   Principal Problem:   Closed right hip fracture (HCC) Active Problems:   Fibromyalgia   DM2 (diabetes mellitus, type 2) (HCC)   Essential hypertension, benign   Dyslipidemia   Coronary atherosclerosis of native coronary artery   COPD (chronic obstructive pulmonary disease) (HCC)   Senile dementia without behavioral disturbance   Chronic diastolic CHF (congestive heart failure) (HCC)   Advance diet Up with therapy Discharge to SNF  Discontinue Plavix and aspirin for a period of 3 weeks, continue with Lovenox for DVT prophylaxis for 3 weeks, and then resume Plavix and aspirin after completion of the Lovenox, return to clinic to see me in 2 weeks, weightbearing as tolerated.  OK for SNF from my standpoint when medically ready.  Please call with additional questions.    Talena Neira P 11/14/2016, 4:16 PM   Marchia Bond, MD Cell [phone]

## 2016-11-15 ENCOUNTER — Encounter (HOSPITAL_COMMUNITY): Payer: Self-pay | Admitting: Orthopedic Surgery

## 2016-11-15 DIAGNOSIS — S72001D Fracture of unspecified part of neck of right femur, subsequent encounter for closed fracture with routine healing: Secondary | ICD-10-CM

## 2016-11-15 DIAGNOSIS — I5032 Chronic diastolic (congestive) heart failure: Secondary | ICD-10-CM

## 2016-11-15 DIAGNOSIS — I5022 Chronic systolic (congestive) heart failure: Secondary | ICD-10-CM

## 2016-11-15 LAB — URINE CULTURE: Culture: >=100000 — AB

## 2016-11-15 LAB — GLUCOSE, CAPILLARY
GLUCOSE-CAPILLARY: 209 mg/dL — AB (ref 65–99)
Glucose-Capillary: 162 mg/dL — ABNORMAL HIGH (ref 65–99)
Glucose-Capillary: 242 mg/dL — ABNORMAL HIGH (ref 65–99)
Glucose-Capillary: 164 mg/dL — ABNORMAL HIGH (ref 65–99)

## 2016-11-15 MED ORDER — SODIUM CHLORIDE 0.9 % IV SOLN
1.0000 g | Freq: Three times a day (TID) | INTRAVENOUS | Status: DC
  Administered 2016-11-15 – 2016-11-16 (×4): 1 g via INTRAVENOUS
  Filled 2016-11-15 (×6): qty 1

## 2016-11-15 NOTE — Clinical Social Work Note (Signed)
CSW will contact [facility] in the AM in order to receive pt's [facility] number. In the meantime, CSW contacted [employer] and insurance authorization has been started at this time.  [address], [place]

## 2016-11-15 NOTE — Progress Notes (Signed)
Occupational Therapy Treatment Patient Details Name: [name] MRN: [id number] DOB: September 13, 1941 Today's Date: 11/15/2016    History of present illness Pt is a 75 y.o. female with R hip fracture secondary to fall and now s/p arthroplasty bipolar hip (hemiarthroplasty). Pt has a PMH significant for sleep apnea, dementia, high cholesterol, hypertension, glaucoma, and coronary artery disease. Therapy spoke with PA Angelena Sole who stated there is some concern about a femur fx. he reccomeded TDWB for today until x-rays are obtained.  .   OT comments  Pt progressing well toward OT goals. Pt pleasant during session and able to complete simulated toilet transfer with decreased physical assistance this session. Able to tolerate standing with B UE support for increased period of time for pericare this session. Additionally, pt able to complete grooming tasks after set-up from bed level. Pt follows commands well but does remain confused, which is her baseline. She would continue to benefit from OT services while admitted to improve independence with ADL. D/C plan remains appropriate. OT will continue to follow acutely.   Follow Up Recommendations  SNF;Supervision/Assistance - 24 hour    Equipment Recommendations  Other (comment) (TBD at next venue of care)    Recommendations for Other Services      Precautions / Restrictions Precautions Precautions: Posterior Hip Precaution Booklet Issued: Yes (comment) Precaution Comments: reminded pt of precautions, but has dementia Restrictions Weight Bearing Restrictions: Yes RLE Weight Bearing: Weight bearing as tolerated       Mobility Bed Mobility Overal bed mobility: Needs Assistance Bed Mobility: Supine to Sit     Supine to sit: +2 for physical assistance;Mod assist     General bed mobility comments: Improved ability to get EOB with assistance for RLE and to maintain hip precautions.   Transfers Overall transfer level: Needs  assistance Equipment used: Rolling walker (2 wheeled) Transfers: Sit to/from Stand Sit to Stand: Mod assist;+2 safety/equipment         General transfer comment: requries assistance to stand and maintain weight bearing through LE's.     Balance Overall balance assessment: Needs assistance Sitting-balance support: No upper extremity supported;Feet supported Sitting balance-Leahy Scale: Fair     Standing balance support: Bilateral upper extremity supported;During functional activity Standing balance-Leahy Scale: Poor Standing balance comment: relies on RW for stability                   ADL Overall ADL's : Needs assistance/impaired     Grooming: Set up;Bed level Grooming Details (indicate cue type and reason): Able to sit up in bed and wash face this session.                 Toilet Transfer: +2 for physical assistance;Ambulation;RW;+2 for safety/equipment;Moderate assistance   Toileting- Clothing Manipulation and Hygiene: Maximal assistance;Sit to/from stand Toileting - Clothing Manipulation Details (indicate cue type and reason): Able to complete sit<>stand with mod assist +2 and required +2 assist to complete pericare and apply diaper.     Functional mobility during ADLs: Moderate assistance;+2 for physical assistance;+2 for safety/equipment;Rolling walker General ADL Comments: Reminded pt of hip precautions but pt with dementia at baseline. Pt able to complete sit<>stand with mod assist +2. Able to ambulate to recliner for simulated toilet transfer this session with improved independence.      Vision                     Quarry manager  Behavior During Therapy: [facility] for tasks assessed/performed;Anxious Overall Cognitive Status: History of cognitive impairments - at baseline                  General Comments: Pt asking "I hurt my leg didn't I?" Additionally asking where she is and how old she is during session.  Pleasant although confused.    Extremity/Trunk Assessment               Exercises General Exercises - Upper Extremity Shoulder Flexion: AROM;Both;10 reps Elbow Flexion: AROM;Both;10 reps Wrist Flexion: AROM;Both;10 reps   Shoulder Instructions       General Comments      Pertinent Vitals/ Pain       Pain Assessment: Faces Faces Pain Scale: Hurts little more Pain Location: R hip with mobility Pain Descriptors / Indicators: Grimacing;Guarding;Moaning Pain Intervention(s): Limited activity within patient's tolerance;Monitored during session;Repositioned;Ice applied  Home Living                                          Prior Functioning/Environment              Frequency  Min 1X/week        Progress Toward Goals  OT Goals(current goals can now be found in the care plan section)  Progress towards OT goals: Progressing toward goals  Acute Rehab OT Goals Patient Stated Goal: to go home OT Goal Formulation: With patient/family Time For Goal Achievement: 11/18/16 Potential to Achieve Goals: Fair ADL Goals Pt Will Perform Grooming: with min guard assist;standing Pt Will Perform Upper Body Dressing: with set-up;sitting Pt Will Perform Lower Body Dressing: with min assist;sit to/from stand Pt Will Transfer to Toilet: with min assist;ambulating;bedside commode  Plan Discharge plan remains appropriate    Co-evaluation                 End of Session Equipment Utilized During Treatment: Gait belt;Rolling walker   Activity Tolerance Patient tolerated treatment well   Patient Left in chair;with call bell/phone within reach   Nurse Communication Mobility status        Time: [id number] OT Time Calculation (min): 29 min  Charges: OT General Charges $OT Visit: 1 Procedure OT Treatments $Self Care/Home Management : 23-37 mins  Norman Herrlich, OTR/L [phone] 11/15/2016, 9:33 AM

## 2016-11-15 NOTE — Progress Notes (Signed)
Pharmacy Antibiotic Note  JANESA [name] is a 75 y.o. female admitted on 11/10/2016 with mechanical fall and hip pain. Had low grade fever while here and cx's drawn. Pharmacy has been consulted for meropenem dosing.  Urine cx shows > 100k ESBL E. Coli and 50k Proteus. Currently afebrile, WBC 12.1. SCr stable, CrCl ~41ml/min. Has mulitple allergies including PCNs and sulfa drugs. Has tolerated cephalosporins in the past including cefazolin.  Plan: Start meropenem 1g IV Q8 Monitor clinical picture, renal function F/U LOT  Could consider Bactrim PO but has unknown reaction to sulfonamides. If able to transition to PO could consider Macrobid and Keflex to cover both organisms.  Weight: 156 lb 1.4 oz (70.8 kg)  Temp (24hrs), Avg:99 F (37.2 C), Min:98.4 F (36.9 C), Max:99.5 F (37.5 C)   Recent Labs Lab 11/10/16 0920 11/11/16 0228 11/12/16 0633 11/13/16 0508  WBC 13.0* 13.8* 11.3* 12.1*  CREATININE 0.93 0.89 0.73  --     Estimated Creatinine Clearance: 60 mL/min (by C-G formula based on SCr of 0.73 mg/dL).    Allergies  Allergen Reactions  . Lisinopril Other (See Comments)    Reaction:  Respiratory arrest   . Spironolactone Anaphylaxis  . Vancomycin Anaphylaxis and Rash  . Donepezil Diarrhea  . Eggs Or Egg-Derived Products Diarrhea  . Erythromycin Other (See Comments)    Reaction:  Makes pt feel weird   . Macrolides And Ketolides Other (See Comments)    Reaction:  Unknown   . Penicillins Itching and Other (See Comments)    Has patient had a PCN reaction causing immediate rash, facial/tongue/throat swelling, SOB or lightheadedness with hypotension: No Has patient had a PCN reaction causing severe rash involving mucus membranes or skin necrosis: No Has patient had a PCN reaction that required hospitalization No Has patient had a PCN reaction occurring within the last 10 years: No If all of the above answers are "NO", then may proceed with Cephalosporin use.  . Quinapril  Hcl Itching  . Angiotensin Receptor Blockers Other (See Comments)    Reaction:  Unknown   . Lipitor [Atorvastatin] Palpitations  . Pentazocine Lactate Other (See Comments)    Reaction:  Unknown   . Propoxyphene Hcl Other (See Comments)    Reaction unknown  . Sulfonamide Derivatives Other (See Comments)    Reaction:  Unknown     Antimicrobials this admission: Meropenem 12/27 >>   Dose adjustments this admission: n/a  Microbiology results: 12/25 UCx: ESBL E. Coli and Proteus 12/22 MRSA PCR: positive  Thank you for allowing pharmacy to be a part of this patient's care.  Elenor Quinones, PharmD, BCPS Clinical Pharmacist Pager [phone] 11/15/2016 9:54 AM

## 2016-11-15 NOTE — Care Management Note (Addendum)
Case Management Note  Patient Details  Name: MARDI EVAVOLD MRN: [id number] Date of Birth: 09/15/1941  Subjective/Objective: 75 y.o. M admitted 11/10/2016 for Right hip Fracture. POD # 5  HemiArthroplasty. Awaiting PASAAR (office opens 11/16/2016).   Discharge has been deferred to [place] but CM will be available should discharge needs arise.                  Action/Plan: Anticipate discharge SNF 11/16/2016.  No further CM needs but will be available should additional discharge needs arise.   Expected Discharge Date:                  Expected Discharge Plan:  Skilled Nursing Facility  In-House Referral:  Clinical Social Work  Discharge planning Services  CM Consult  Post Acute Care Choice:  NA Choice offered to:  Patient  DME Arranged:  N/A DME Agency:     HH Arranged:    [place] Agency:     Status of Service:  Completed, signed off  If discussed at [employer], dates discussed:    Additional Comments:  Delrae Sawyers, RN 11/15/2016, 10:31 AM

## 2016-11-15 NOTE — Plan of Care (Signed)
Problem: Safety: Goal: Ability to remain free from injury will improve Outcome: Progressing No fall incidents as of this writing;bed alarm on; room lighting adequate;constant observation and hourly rounding reinforced

## 2016-11-15 NOTE — Progress Notes (Signed)
PROGRESS NOTE    [name]  [id number] DOB: 11-Sep-1941 DOA: 11/10/2016  PCP: Vidal Schwalbe, MD   Brief Narrative:  [name] is a 75 y.o. female with medical history significant for COPD and tobacco abuse, dementia, chronic systolic heart failure, CAD with last cardiac catheterization 2016 demonstrating patent stent, and fibromyalgia. Patient with mechanical fall and dining hall facility today without loss of consciousness. Reported right hip pain also sustained small contusion to right posterior head.  Subjective: Pleasantly confused- no complaints other than needing to get up to use the bathroom and not liking being alone in her room.   Assessment & Plan:    Closed right hip fracture  - s/p mechanical fall - s/p right hemiarthroplasty on 12/22 - management per ortho-lovenox for DVT prophylaxis at DC - per PT, will need SNF- awaiting a PASAR which could take until Thursday or Friday per social work.    Coronary atherosclerosis of native coronary artery -Underwent Rotablator with drug-eluting stent 2 to RCA in 2012 with catheterization in 2016 demonstrating patent stent - on aspirin and plavix per cardiology [facility]) when last assessed in 3/17 - resumed Plavix- no signs of bleeding-  resumed Aspirin 12/25   - continue  Imdur, Lipitor and beta blocker   ESBL Ecoli UTI  -given low grade fever and dysuria -start Imipenem today, will rx for 3days  Anemia - due to acute blood loss? Ortho has started Iron replacement - baseline Hb 13-14 >> 11.9 >> 9.9>> 10.2 on 12/25    DM2 (diabetes mellitus, type 2)  -  Lantus and SSI- sugars elevated- increased Lantus 12/25 with improvement - Hold metformin, SSI, stable    Essential hypertension, benign -Current blood pressure controlled -Continue Toprol    Chronic grade 1 Diastolic dysfunction -Most recent echocardiogram demonstrates preserved LV function but patient does have mild grade 1 diastolic dysfunction which is  compensated  - holding lasix for now    COPD (chronic obstructive pulmonary disease)  -Compensated without wheezing -Continue preadmission MDIs    Fibromyalgia -Preadmission was on low-dose Percocet every 6 hours PRN    Dyslipidemia -Continue Lipitor    Senile dementia without behavioral disturbance -Continue Namenda and Remeron -stable   DVT prophylaxis: Lovenox Code Status: DNR Family Communication:  Disposition Plan: SNF when PASSAr available  Consultants:   ortho Procedures:   R hip hemiarthroplasty 12/22   Antimicrobials:  Anti-infectives    Start     Dose/Rate Route Frequency Ordered Stop   11/15/16 1030  meropenem (MERREM) 1 g in sodium chloride 0.9 % 100 mL IVPB     1 g 200 mL/hr over 30 Minutes Intravenous Every 8 hours 11/15/16 0957     11/11/16 1830  ceFAZolin (ANCEF) IVPB 2g/100 mL premix     2 g 200 mL/hr over 30 Minutes Intravenous On call to O.R. 11/10/16 1218 11/10/16 1930   11/11/16 0300  ceFAZolin (ANCEF) IVPB 2g/100 mL premix     2 g 200 mL/hr over 30 Minutes Intravenous Every 6 hours 11/11/16 0159 11/11/16 0902       Objective: Vitals:   11/14/16 1944 11/15/16 0547 11/15/16 0811 11/15/16 0924  BP:  (!) 134/49  (!) 142/53  Pulse:  83  97  Resp:  17    Temp:  99.5 F (37.5 C)    TempSrc:  Oral    SpO2: 95% 93% 91%   Weight:  70.8 kg (156 lb 1.4 oz)      Intake/Output Summary (Last 24  hours) at 11/15/16 1307 Last data filed at 11/15/16 0900  Gross per 24 hour  Intake              480 ml  Output                0 ml  Net              480 ml   Filed Weights   11/12/16 1945 11/13/16 1900 11/15/16 0547  Weight: 72.8 kg (160 lb 7.9 oz) 73.5 kg (162 lb 0.6 oz) 70.8 kg (156 lb 1.4 oz)    Examination: General exam: Appears comfortable HEENT: PERRLA, oral mucosa moist, no sclera icterus or thrush Respiratory system: Clear to auscultation. Respiratory effort normal. Cardiovascular system: S1 & S2 heard, RRR.  No murmurs    Gastrointestinal system: Abdomen soft, non-tender, nondistended. Normal bowel sound. No organomegaly Central nervous system: Alert - confused to time and place-  No focal neurological deficits. Extremities: No cyanosis, clubbing - left hip swollen Skin: No rashes or ulcers Psychiatry:  Mood & affect appropriate.     Data Reviewed: I have personally reviewed following labs and imaging studies  CBC:  Recent Labs Lab 11/10/16 0920 11/11/16 0228 11/12/16 0633 11/13/16 0508  WBC 13.0* 13.8* 11.3* 12.1*  NEUTROABS 10.3*  --   --   --   HGB 13.8 11.9* 9.9* 10.2*  HCT 41.0 35.2* 30.0* 31.1*  MCV 98.1 97.2 97.7 100.6*  PLT 198 177 169 [id number]*   Basic Metabolic Panel:  Recent Labs Lab 11/10/16 0920 11/11/16 0228 11/12/16 0633  NA 138 137 137  K 4.2 4.6 3.8  CL 101 101 102  CO2 28 24 26   GLUCOSE 164* 227* 216*  BUN 17 17 12   CREATININE 0.93 0.89 0.73  CALCIUM 9.5 8.4* 8.1*   GFR: Estimated Creatinine Clearance: 60 mL/min (by C-G formula based on SCr of 0.73 mg/dL). Liver Function Tests: No results for input(s): AST, ALT, ALKPHOS, BILITOT, PROT, ALBUMIN in the last 168 hours. No results for input(s): LIPASE, AMYLASE in the last 168 hours. No results for input(s): AMMONIA in the last 168 hours. Coagulation Profile:  Recent Labs Lab 11/10/16 0920  INR 1.04   Cardiac Enzymes: No results for input(s): CKTOTAL, CKMB, CKMBINDEX, TROPONINI in the last 168 hours. BNP (last 3 results) No results for input(s): PROBNP in the last 8760 hours. HbA1C: No results for input(s): HGBA1C in the last 72 hours. CBG:  Recent Labs Lab 11/14/16 1126 11/14/16 1647 11/14/16 1952 11/15/16 0609 11/15/16 1113  GLUCAP 219* 176* 200* 162* 242*   Lipid Profile: No results for input(s): CHOL, HDL, LDLCALC, TRIG, CHOLHDL, LDLDIRECT in the last 72 hours. Thyroid Function Tests: No results for input(s): TSH, T4TOTAL, FREET4, T3FREE, THYROIDAB in the last 72 hours. Anemia Panel: No results  for input(s): VITAMINB12, FOLATE, FERRITIN, TIBC, IRON, RETICCTPCT in the last 72 hours. Urine analysis:    Component Value Date/Time   COLORURINE YELLOW 11/13/2016 1325   APPEARANCEUR CLEAR 11/13/2016 1325   LABSPEC 1.017 11/13/2016 1325   PHURINE 5.0 11/13/2016 1325   GLUCOSEU 50 (A) 11/13/2016 1325   HGBUR NEGATIVE 11/13/2016 1325   BILIRUBINUR NEGATIVE 11/13/2016 1325   KETONESUR NEGATIVE 11/13/2016 1325   PROTEINUR NEGATIVE 11/13/2016 1325   UROBILINOGEN 0.2 01/23/2014 2343   NITRITE NEGATIVE 11/13/2016 1325   LEUKOCYTESUR NEGATIVE 11/13/2016 1325   Sepsis Labs: @LABRCNTIP (procalcitonin:4,lacticidven:4) ) Recent Results (from the past 240 hour(s))  Urine culture     Status: Abnormal  Collection Time: 11/10/16 12:55 PM  Result Value Ref Range Status   Specimen Description URINE, CATHETERIZED  Final   Special Requests NONE  Final   Culture >=100,000 COLONIES/mL PROTEUS MIRABILIS (A)  Final   Report Status 11/12/2016 FINAL  Final   Organism ID, Bacteria PROTEUS MIRABILIS (A)  Final      Susceptibility   Proteus mirabilis - MIC*    AMPICILLIN <=2 SENSITIVE Sensitive     CEFAZOLIN <=4 SENSITIVE Sensitive     CEFTRIAXONE <=1 SENSITIVE Sensitive     CIPROFLOXACIN <=0.25 SENSITIVE Sensitive     GENTAMICIN <=1 SENSITIVE Sensitive     IMIPENEM 2 SENSITIVE Sensitive     NITROFURANTOIN 256 RESISTANT Resistant     TRIMETH/SULFA <=20 SENSITIVE Sensitive     AMPICILLIN/SULBACTAM <=2 SENSITIVE Sensitive     PIP/TAZO <=4 SENSITIVE Sensitive     * >=100,000 COLONIES/mL PROTEUS MIRABILIS  Surgical pcr screen     Status: Abnormal   Collection Time: 11/10/16 12:56 PM  Result Value Ref Range Status   MRSA, PCR NEGATIVE NEGATIVE Final   Staphylococcus aureus POSITIVE (A) NEGATIVE Final    Comment:        The Xpert SA Assay (FDA approved for NASAL specimens in patients over 39 years of age), is one component of a comprehensive surveillance program.  Test performance has been  validated by [facility] for patients greater than or equal to 43 year old. It is not intended to diagnose infection nor to guide or monitor treatment.   Urine culture     Status: Abnormal   Collection Time: 11/13/16  1:25 PM  Result Value Ref Range Status   Specimen Description URINE, RANDOM  Final   Special Requests NONE  Final   Culture (A)  Final    >=100,000 COLONIES/mL ESCHERICHIA COLI 50,000 COLONIES/mL PROTEUS MIRABILIS THE ESCHERICHIA COLI IS A Confirmed Extended Spectrum Beta-Lactamase Producer (ESBL)    Report Status 11/15/2016 FINAL  Final   Organism ID, Bacteria ESCHERICHIA COLI (A)  Final   Organism ID, Bacteria PROTEUS MIRABILIS (A)  Final      Susceptibility   Escherichia coli - MIC*    AMPICILLIN >=32 RESISTANT Resistant     CEFAZOLIN >=64 RESISTANT Resistant     CEFTRIAXONE >=64 RESISTANT Resistant     CIPROFLOXACIN >=4 RESISTANT Resistant     GENTAMICIN <=1 SENSITIVE Sensitive     IMIPENEM <=0.25 SENSITIVE Sensitive     NITROFURANTOIN <=16 SENSITIVE Sensitive     TRIMETH/SULFA <=20 SENSITIVE Sensitive     AMPICILLIN/SULBACTAM 4 SENSITIVE Sensitive     PIP/TAZO <=4 SENSITIVE Sensitive     Extended ESBL POSITIVE Resistant     * >=100,000 COLONIES/mL ESCHERICHIA COLI   Proteus mirabilis - MIC*    AMPICILLIN <=2 SENSITIVE Sensitive     CEFAZOLIN <=4 SENSITIVE Sensitive     CEFTRIAXONE <=1 SENSITIVE Sensitive     CIPROFLOXACIN <=0.25 SENSITIVE Sensitive     GENTAMICIN <=1 SENSITIVE Sensitive     IMIPENEM 2 SENSITIVE Sensitive     NITROFURANTOIN 128 RESISTANT Resistant     TRIMETH/SULFA <=20 SENSITIVE Sensitive     AMPICILLIN/SULBACTAM <=2 SENSITIVE Sensitive     PIP/TAZO <=4 SENSITIVE Sensitive     * 50,000 COLONIES/mL PROTEUS MIRABILIS         Radiology Studies: No results found.    Scheduled Meds: . atorvastatin  40 mg Oral Daily  . docusate sodium  100 mg Oral BID  . enoxaparin (LOVENOX) injection  40 mg  Subcutaneous Daily  . ferrous  sulfate  325 mg Oral TID PC  . insulin aspart  0-15 Units Subcutaneous TID WC  . insulin aspart  0-5 Units Subcutaneous QHS  . insulin glargine  12 Units Subcutaneous QHS  . isosorbide mononitrate  15 mg Oral Daily  . memantine  10 mg Oral BID  . meropenem (MERREM) IV  1 g Intravenous Q8H  . metoprolol succinate  12.5 mg Oral Daily  . mirtazapine  15 mg Oral QHS  . mometasone-formoterol  2 puff Inhalation BID  . pantoprazole  40 mg Oral Daily  . senna  1 tablet Oral BID   Continuous Infusions:    LOS: 5 days    Time spent: 52min    Haydan Wedig, MD [facility] Pager: [email or web address] Password [email or web address] 11/15/2016, 1:07 PM

## 2016-11-16 DIAGNOSIS — F0390 Unspecified dementia without behavioral disturbance: Secondary | ICD-10-CM | POA: Diagnosis not present

## 2016-11-16 DIAGNOSIS — J449 Chronic obstructive pulmonary disease, unspecified: Secondary | ICD-10-CM | POA: Diagnosis not present

## 2016-11-16 DIAGNOSIS — N390 Urinary tract infection, site not specified: Secondary | ICD-10-CM | POA: Diagnosis not present

## 2016-11-16 DIAGNOSIS — S72001D Fracture of unspecified part of neck of right femur, subsequent encounter for closed fracture with routine healing: Secondary | ICD-10-CM | POA: Diagnosis not present

## 2016-11-16 DIAGNOSIS — D62 Acute posthemorrhagic anemia: Secondary | ICD-10-CM | POA: Diagnosis not present

## 2016-11-16 DIAGNOSIS — Z1612 Extended spectrum beta lactamase (ESBL) resistance: Secondary | ICD-10-CM | POA: Diagnosis not present

## 2016-11-16 DIAGNOSIS — M6281 Muscle weakness (generalized): Secondary | ICD-10-CM | POA: Diagnosis not present

## 2016-11-16 DIAGNOSIS — I10 Essential (primary) hypertension: Secondary | ICD-10-CM | POA: Diagnosis not present

## 2016-11-16 DIAGNOSIS — E119 Type 2 diabetes mellitus without complications: Secondary | ICD-10-CM | POA: Diagnosis not present

## 2016-11-16 DIAGNOSIS — I25118 Atherosclerotic heart disease of native coronary artery with other forms of angina pectoris: Secondary | ICD-10-CM | POA: Diagnosis not present

## 2016-11-16 DIAGNOSIS — R262 Difficulty in walking, not elsewhere classified: Secondary | ICD-10-CM | POA: Diagnosis not present

## 2016-11-16 DIAGNOSIS — E785 Hyperlipidemia, unspecified: Secondary | ICD-10-CM | POA: Diagnosis not present

## 2016-11-16 DIAGNOSIS — B964 Proteus (mirabilis) (morganii) as the cause of diseases classified elsewhere: Secondary | ICD-10-CM | POA: Diagnosis not present

## 2016-11-16 DIAGNOSIS — A498 Other bacterial infections of unspecified site: Secondary | ICD-10-CM | POA: Diagnosis not present

## 2016-11-16 DIAGNOSIS — Z9181 History of falling: Secondary | ICD-10-CM | POA: Diagnosis not present

## 2016-11-16 DIAGNOSIS — I5032 Chronic diastolic (congestive) heart failure: Secondary | ICD-10-CM | POA: Diagnosis not present

## 2016-11-16 DIAGNOSIS — R488 Other symbolic dysfunctions: Secondary | ICD-10-CM | POA: Diagnosis not present

## 2016-11-16 LAB — CBC
HCT: 31.4 % — ABNORMAL LOW (ref 36.0–46.0)
HEMOGLOBIN: 10.2 g/dL — AB (ref 12.0–15.0)
MCH: 32.6 pg (ref 26.0–34.0)
MCHC: 32.5 g/dL (ref 30.0–36.0)
MCV: 100.3 fL — ABNORMAL HIGH (ref 78.0–100.0)
PLATELETS: 200 10*3/uL (ref 150–400)
RBC: 3.13 MIL/uL — AB (ref 3.87–5.11)
RDW: 13.6 % (ref 11.5–15.5)
WBC: 8.9 10*3/uL (ref 4.0–10.5)

## 2016-11-16 LAB — BASIC METABOLIC PANEL
ANION GAP: 4 — AB (ref 5–15)
BUN: 11 mg/dL (ref 4–21)
BUN: 11 mg/dL (ref 6–20)
CALCIUM: 8.2 mg/dL — AB (ref 8.9–10.3)
CO2: 31 mmol/L (ref 22–32)
CREATININE: 0.8 mg/dL (ref 0.5–1.1)
CREATININE: 0.77 mg/dL (ref 0.44–1.00)
Chloride: 103 mmol/L (ref 101–111)
GLUCOSE: 220 mg/dL
Glucose, Bld: 220 mg/dL — ABNORMAL HIGH (ref 65–99)
Potassium: 3.6 mmol/L (ref 3.5–5.1)
SODIUM: 138 mmol/L (ref 137–147)
Sodium: 138 mmol/L (ref 135–145)

## 2016-11-16 LAB — CBC AND DIFFERENTIAL: WBC: 5.9 10*3/mL

## 2016-11-16 LAB — GLUCOSE, CAPILLARY
GLUCOSE-CAPILLARY: 190 mg/dL — AB (ref 65–99)
GLUCOSE-CAPILLARY: 201 mg/dL — AB (ref 65–99)

## 2016-11-16 MED ORDER — CEPHALEXIN 250 MG PO CAPS: 250.0000 mg | ORAL_CAPSULE | Freq: Four times a day (QID) | ORAL | Status: DC

## 2016-11-16 MED ORDER — LORAZEPAM 0.5 MG PO TABS: 0.5000 mg | ORAL_TABLET | Freq: Two times a day (BID) | ORAL | 0 refills | Status: DC | PRN

## 2016-11-16 MED ORDER — NITROFURANTOIN MACROCRYSTAL 100 MG PO CAPS: 100.0000 mg | ORAL_CAPSULE | Freq: Two times a day (BID) | ORAL | Status: DC

## 2016-11-16 MED ORDER — POLYETHYLENE GLYCOL 3350 17 G PO PACK: 17.0000 g | PACK | Freq: Every day | ORAL | 0 refills | Status: DC | PRN

## 2016-11-16 MED ORDER — INSULIN GLARGINE 100 UNITS/ML SOLOSTAR PEN: 15.0000 [IU] | PEN_INJECTOR | Freq: Every day | SUBCUTANEOUS | Status: DC

## 2016-11-16 MED ORDER — HYDROCODONE-ACETAMINOPHEN 5-325 MG PO TABS: 1.0000 | ORAL_TABLET | Freq: Four times a day (QID) | ORAL | 0 refills | Status: DC | PRN

## 2016-11-16 NOTE — NC FL2 (Signed)
[place] MEDICAID FL2 LEVEL OF CARE SCREENING TOOL     IDENTIFICATION  Patient Name: [name] Birthdate: [birth year] Sex: female Admission Date (Current Location): 11/10/2016  [facility] and [place] Number:  Herbalist and Address:  [facility], [place] [address] [place], [place] [address]      Provider Number: [id number]  Attending Physician Name and Address:  Domenic Polite, MD  Relative Name and Phone Number:       Current Level of Care: Hospital Recommended Level of Care: [place] Prior Approval Number:    Date Approved/Denied: 01/03/16 PASRR Number: [id number]  Discharge Plan: SNF    Current Diagnoses: Patient Active Problem List   Diagnosis Date Noted  . Chronic diastolic CHF (congestive heart failure) ([place]) 11/12/2016  . Closed right hip fracture ([place]) 11/10/2016  . 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 Obesity (BMI 30-39.9) 10/06/2015  . Tobacco use disorder 03/17/2015  . Cardiomyopathy, ischemic 03/17/2015  . Allergy to ACE inhibitors 03/17/2015  . Ventricular tachycardia ([place]) 03/17/2015  . COPD (chronic obstructive pulmonary disease) ([place]) 12/31/2014  . Chronic cough 09/12/2013  . Memory deficit 04/23/2013  . Essential hypertension, benign 04/03/2013  . Dyslipidemia 04/03/2013  . Coronary atherosclerosis of native coronary artery 04/03/2013  . DM2 (diabetes mellitus, type 2) ([place]) 04/02/2013  . Fibromyalgia 04/20/2008    Orientation RESPIRATION BLADDER Height & Weight     Self  Normal Incontinent Weight: 160 lb 0.9 oz (72.6 kg) Height:     BEHAVIORAL SYMPTOMS/MOOD NEUROLOGICAL BOWEL NUTRITION STATUS   (none )  (none ) Continent  (Please see d/c summary)  AMBULATORY STATUS COMMUNICATION OF NEEDS Skin   Extensive Assist Verbally Surgical wounds (Closed incision right hip, foam dressing)                       Personal Care Assistance Level of Assistance  Bathing,  Feeding, Dressing Bathing Assistance: Maximum assistance Feeding assistance: Limited assistance Dressing Assistance: Maximum assistance     Functional Limitations Info  Sight, Hearing, Speech Sight Info: Adequate Hearing Info: Adequate Speech Info: Adequate    SPECIAL CARE FACTORS FREQUENCY  PT (By licensed PT), OT (By licensed OT)     PT Frequency: min 3x week OT Frequency: min 3x week            Contractures Contractures Info: Not present    Additional Factors Info  Code Status, Allergies, Isolation Precautions Code Status Info: DNR Allergies Info: Lisinopril, Spironolactone, Vancomycin, Donepezil, Eggs Or Egg-derived Products, Erythromycin, Macrolides And Ketolides, Penicillins, Quinapril Hcl, Angiotensin Receptor Blockers, Lipitor Atorvastatin, Pentazocine Lactate, Propoxyphene Hcl, Sulfonamide Derivatives     Isolation Precautions Info: Contact Precautions; ESBL     Current Medications (11/16/2016):  This is the current hospital active medication list Current Facility-Administered Medications  Medication Dose Route Frequency Provider Last Rate Last Dose  . acetaminophen (TYLENOL) tablet 650 mg  650 mg Oral Q6H PRN Marchia Bond, MD   650 mg at 11/12/16 1703   Or  . acetaminophen (TYLENOL) suppository 650 mg  650 mg Rectal Q6H PRN Marchia Bond, MD      . albuterol (PROVENTIL) (2.5 MG/3ML) 0.083% nebulizer solution 2.5 mg  2.5 mg Inhalation Q6H PRN Samella Parr, NP   2.5 mg at 11/10/16 2235  . alum & mag hydroxide-simeth (MAALOX/MYLANTA) 200-200-20 MG/5ML suspension 30 mL  30 mL Oral Q4H PRN Marchia Bond, MD      . atorvastatin (LIPITOR) tablet  40 mg  40 mg Oral Daily Samella Parr, NP   40 mg at 11/15/16 [id number]  . bisacodyl (DULCOLAX) suppository 10 mg  10 mg Rectal Daily PRN Marchia Bond, MD      . docusate sodium (COLACE) capsule 100 mg  100 mg Oral BID Marchia Bond, MD   100 mg at 11/15/16 2053  . enoxaparin (LOVENOX) injection 40 mg  40 mg Subcutaneous Daily  Marchia Bond, MD   40 mg at 11/15/16 0924  . ferrous sulfate tablet 325 mg  325 mg Oral TID PC Marchia Bond, MD   325 mg at 11/15/16 1757  . HYDROcodone-acetaminophen (NORCO/VICODIN) 5-325 MG per tablet 1 tablet  1 tablet Oral Q6H PRN Debbe Odea, MD   1 tablet at 11/15/16 2228  . insulin aspart (novoLOG) injection 0-15 Units  0-15 Units Subcutaneous TID WC Debbe Odea, MD   3 Units at 11/16/16 0700  . insulin aspart (novoLOG) injection 0-5 Units  0-5 Units Subcutaneous QHS Debbe Odea, MD   2 Units at 11/15/16 2222  . insulin glargine (LANTUS) injection 12 Units  12 Units Subcutaneous QHS Debbe Odea, MD   12 Units at 11/15/16 2222  . isosorbide mononitrate (IMDUR) 24 hr tablet 15 mg  15 mg Oral Daily Samella Parr, NP   15 mg at 11/15/16 0925  . LORazepam (ATIVAN) tablet 0.5 mg  0.5 mg Oral BID PRN Samella Parr, NP   0.5 mg at 11/16/16 0320  . magnesium citrate solution 1 Bottle  1 Bottle Oral Once PRN Marchia Bond, MD      . memantine [facility]) tablet 10 mg  10 mg Oral BID Samella Parr, NP   10 mg at 11/15/16 2053  . menthol-cetylpyridinium (CEPACOL) lozenge 3 mg  1 lozenge Oral PRN Marchia Bond, MD       Or  . phenol (CHLORASEPTIC) mouth spray 1 spray  1 spray Mouth/Throat PRN Marchia Bond, MD      . meropenem (MERREM) 1 g in sodium chloride 0.9 % 100 mL IVPB  1 g Intravenous Q8H Cecilio Asper Batchelder, RPH   1 g at 11/16/16 0246  . metoprolol succinate (TOPROL-XL) 24 hr tablet 12.5 mg  12.5 mg Oral Daily Samella Parr, NP   12.5 mg at 11/15/16 0926  . mirtazapine (REMERON SOL-TAB) disintegrating tablet 15 mg  15 mg Oral QHS Samella Parr, NP   15 mg at 11/15/16 2053  . mometasone-formoterol (DULERA) 100-5 MCG/ACT inhaler 2 puff  2 puff Inhalation BID Samella Parr, NP   2 puff at 11/15/16 1958  . morphine 2 MG/ML injection 2 mg  2 mg Intravenous Q2H PRN Marchia Bond, MD      . ondansetron [facility]) tablet 4 mg  4 mg Oral Q6H PRN Marchia Bond, MD       Or  . ondansetron  [facility]) injection 4 mg  4 mg Intravenous Q6H PRN Marchia Bond, MD      . pantoprazole (PROTONIX) EC tablet 40 mg  40 mg Oral Daily Samella Parr, NP   40 mg at 11/15/16 0925  . polyethylene glycol (MIRALAX / GLYCOLAX) packet 17 g  17 g Oral Daily PRN Marchia Bond, MD      . senna [facility]) tablet 8.6 mg  1 tablet Oral BID Marchia Bond, MD   8.6 mg at 11/15/16 2054     Discharge Medications: Please see discharge summary for a list of discharge medications.  Relevant Imaging Results:  Relevant  Lab Results:   Additional Information SSN: [id number]  Alla German, LCSW

## 2016-11-16 NOTE — Progress Notes (Signed)
Pt stable for d/c to SNF today per MD. Pt was slightly drowsy this afternoon, arousable, and VSS. Night RN gave .5mg  ativan after 3 am. MD notified and stated pt still ok to d/c, drowsiness attributed to medication. Report was called to Olu, nurse at [facility] [employer], all questions answered. Belongings packed and will be sent with pt. Waiting for transportation via [facility] [place].   [place], Jerry Caras

## 2016-11-16 NOTE — Clinical Social Work Note (Signed)
Per [employer] PT needs to complete prior function section on eval before authorization can be submitted. PT notified.  [address] [place]

## 2016-11-16 NOTE — Clinical Social Work Note (Signed)
CSW has received insurnace authorization at this time. Authorization number: [id number]. Rug level: RVC. CSW will follow up with facility.   [address] [place]

## 2016-11-16 NOTE — Progress Notes (Signed)
PT Cancellation Note  Patient Details Name: [name] MRN: [id number] DOB: [birth year]   Cancelled Treatment:    Reason Eval/Treat Not Completed: Other (comment) Pt cleared to go to SNF this afternoon. Pt will need to be in bed when transport arrives and pt was sleeping initially when therapy attempted visit. All other therapy can be accomplished at next venue of care.    Scheryl Marten PT, DPT  [phone]  11/16/2016, 2:53 PM

## 2016-11-16 NOTE — Clinical Social Work Note (Signed)
Clinical Social Worker facilitated patient discharge including contacting patient family and facility to confirm patient discharge plans.  Clinical information faxed to facility and family agreeable with plan.  CSW arranged ambulance transport via [facility] to [facility] .  RN to call [phone] for report prior to discharge.  Clinical Social Worker will sign off for now as social work intervention is no longer needed. Please consult [place] again if new need arises.  [address] [place]

## 2016-11-16 NOTE — Discharge Summary (Signed)
Physician Discharge Summary  [name] [id number] DOB: 1941/06/08 DOA: 11/10/2016  PCP: Kathleen Schwalbe, MD  Admit date: 11/10/2016 Discharge date: 11/16/2016  Time spent: 45 minutes  Recommendations for Outpatient Follow-up:  1. Ortho [name] in 1-2weeks 2. PCP [name] in 1 week 3. Stop lovenox after 3-4weeks and then resume ASA 81mg    Discharge Diagnoses:  Principal Problem:   Closed right hip fracture ([place]) Active Problems:   Fibromyalgia   DM2 (diabetes mellitus, type 2) ([place])   Essential hypertension, benign   Dyslipidemia   Coronary atherosclerosis of native coronary artery   COPD (chronic obstructive pulmonary disease) ([facility])   Senile dementia without behavioral disturbance   Chronic diastolic CHF (congestive heart failure) ([place])   Discharge Condition: stable  Diet recommendation: heart healthy diabetic  Filed Weights   11/13/16 1900 11/15/16 0547 11/16/16 0647  Weight: 73.5 kg (162 lb 0.6 oz) 70.8 kg (156 lb 1.4 oz) 72.6 kg (160 lb 0.9 oz)    History of present illness:  Kathleen D Mounceis a 75 y.o.femalewith medical history significant for COPD and tobacco abuse, dementia, chronic systolic heart failure, CAD with last cardiac catheterization 2016 demonstrating patent stent,and fibromyalgia. Patient with mechanical fall and dining hall and found to have a R hip fracture  Hospital Course:   Closed right hip fracture  - s/p mechanical fall - Ortho consulted, s/p right hemiarthroplasty on 12/22 - management per ortho-lovenox for DVT prophylaxis at DC for 3-4weeks [facility] FU with [name] in 1-2weeks  Coronary atherosclerosis of native coronary artery -Underwent Rotablator with drug-eluting stent 2 to RCA in 2012 with catheterization in 2016 demonstrating patent stent - on aspirin and plavix per cardiology [facility]) when last assessed in 3/17 - resumed Plavix,   resume ASA after lovenox stopped in 3weeks  - continue  Imdur,  Lipitor and beta blocker  -stable  ESBL Ecoli UTI and proteus in Urine -given low grade fever and dysuria  -treated with Imipenem x2days and transitioned to PO keflex and nitrofurantoin for 2days to cover both organisms   Anemia - due to acute blood loss - baseline Hb 13-14 >> 11.9 >> 9.9>> 10.2 -stable now  DM2 (diabetes mellitus, type 2)  - stale, resumed  Lantus and meal coverage SSI-  Essential hypertension, benign -Current blood pressure controlled -Continue Toprol  Chronic grade 1 Diastolic dysfunction -Most recent echocardiogram demonstrates preserved LV function but patient does have mild grade 1 diastolic dysfunction which is compensated  -resumed low dose PO lasix  COPD (chronic obstructive pulmonary disease)  -Compensated without wheezing -Continue preadmission MDIs  Fibromyalgia -Preadmission was on low-dose Percocet every 6 hours PRN  Dyslipidemia -Continue Lipitor  Senile dementia without behavioral disturbance -Continue Namenda and Remeron -stable    Procedures:  R hip hemiarthroplasty 12/22  Consultations:  Ortho   Discharge Exam: Vitals:   11/15/16 2005 11/16/16 0647  BP: (!) 136/57 (!) 146/60  Pulse: 92 86  Resp: 17 17  Temp: 98.2 F (36.8 C) 98.2 F (36.8 C)    General: AAOx1, pleasantly confused at times Cardiovascular: S1S2/RRR Respiratory: CTAB  Discharge Instructions   Discharge Instructions    Diet - low sodium heart healthy    Complete by:  As directed    Diet Carb Modified    Complete by:  As directed      Current Discharge Medication List    START taking these medications   Details  acetaminophen (TYLENOL) 325 MG tablet Take 2 tablets (650 mg total)  by mouth every 6 (six) hours as needed. Qty: 60 tablet, Refills: 1    cephALEXin (KEFLEX) 250 MG capsule Take 1 capsule (250 mg total) by mouth 4 (four) times daily. For 2days, was able to tolerate cephalosporins in the past    enoxaparin  (LOVENOX) 40 MG/0.4ML injection Inject 0.4 mLs (40 mg total) into the skin daily. Qty: 21 Syringe, Refills: 0    HYDROcodone-acetaminophen (NORCO/VICODIN) 5-325 MG tablet Take 1 tablet by mouth every 6 (six) hours as needed for moderate pain. Qty: 30 tablet, Refills: 0    nitrofurantoin (MACRODANTIN) 100 MG capsule Take 1 capsule (100 mg total) by mouth 2 (two) times daily. For 2days    polyethylene glycol (MIRALAX / GLYCOLAX) packet Take 17 g by mouth daily as needed for mild constipation. Refills: 0    sennosides-docusate sodium (SENOKOT-S) 8.6-50 MG tablet Take 2 tablets by mouth daily. Qty: 30 tablet, Refills: 1      CONTINUE these medications which have CHANGED   Details  insulin glargine (LANTUS) 100 unit/mL SOPN Inject 0.15 mLs (15 Units total) into the skin at bedtime.    LORazepam (ATIVAN) 0.5 MG tablet Take 1 tablet (0.5 mg total) by mouth 2 (two) times daily as needed for anxiety. Qty: 10 tablet, Refills: 0      CONTINUE these medications which have NOT CHANGED   Details  albuterol (ACCUNEB) 0.63 MG/3ML nebulizer solution Take 1 ampule by nebulization daily.    albuterol (PROVENTIL HFA;VENTOLIN HFA) 108 (90 BASE) MCG/ACT inhaler Inhale 2 puffs into the lungs every 6 (six) hours as needed for wheezing or shortness of breath.     atorvastatin (LIPITOR) 40 MG tablet Take 40 mg by mouth daily.    clopidogrel (PLAVIX) 75 MG tablet Take 75 mg by mouth daily.    furosemide (LASIX) 20 MG tablet Take 20 mg by mouth daily.     insulin lispro (HUMALOG KWIKPEN) 100 UNIT/ML KiwkPen Inject 4 Units into the skin 3 (three) times daily with meals.    ipratropium (ATROVENT) 0.02 % nebulizer solution Take 0.5 mg by nebulization daily.    isosorbide mononitrate (IMDUR) 30 MG 24 hr tablet Take 0.5 tablets (15 mg total) by mouth daily. Qty: 15 tablet, Refills: 5   Associated Diagnoses: Coronary artery disease involving native coronary artery of native heart without angina pectoris     memantine (NAMENDA) 10 MG tablet Take 10 mg by mouth 2 (two) times daily.    metFORMIN (GLUCOPHAGE) 1000 MG tablet Take 1,000 mg by mouth 2 (two) times daily with a meal.     metoprolol succinate (TOPROL XL) 25 MG 24 hr tablet Take 0.5 tablets (12.5 mg total) by mouth daily. Qty: 30 tablet, Refills: 0    mirtazapine (REMERON SOL-TAB) 15 MG disintegrating tablet Take 1 tablet (15 mg total) by mouth at bedtime.    mometasone-formoterol (DULERA) 100-5 MCG/ACT AERO Inhale 2 puffs into the lungs 2 (two) times daily.    nitroGLYCERIN (NITROSTAT) 0.4 MG SL tablet Place 0.4 mg under the tongue every 5 (five) minutes as needed for chest pain.    omeprazole (PRILOSEC) 20 MG capsule Take 20 mg by mouth daily before breakfast.       STOP taking these medications     aspirin EC 81 MG tablet      oxyCODONE-acetaminophen (PERCOCET/ROXICET) 5-325 MG tablet        Allergies  Allergen Reactions  . Lisinopril Other (See Comments)    Reaction:  Respiratory arrest   . Spironolactone  Anaphylaxis  . Vancomycin Anaphylaxis and Rash  . Donepezil Diarrhea  . Eggs Or Egg-Derived Products Diarrhea  . Erythromycin Other (See Comments)    Reaction:  Makes pt feel weird   . Macrolides And Ketolides Other (See Comments)    Reaction:  Unknown   . Penicillins Itching and Other (See Comments)    Has patient had a PCN reaction causing immediate rash, facial/tongue/throat swelling, SOB or lightheadedness with hypotension: No Has patient had a PCN reaction causing severe rash involving mucus membranes or skin necrosis: No Has patient had a PCN reaction that required hospitalization No Has patient had a PCN reaction occurring within the last 10 years: No If all of the above answers are "NO", then may proceed with Cephalosporin use.  . Quinapril Hcl Itching  . Angiotensin Receptor Blockers Other (See Comments)    Reaction:  Unknown   . Lipitor [Atorvastatin] Palpitations  . Pentazocine Lactate Other (See  Comments)    Reaction:  Unknown   . Propoxyphene Hcl Other (See Comments)    Reaction unknown  . Sulfonamide Derivatives Other (See Comments)    Reaction:  Unknown     Contact information for follow-up providers    LANDAU,JOSHUA P, MD. Schedule an appointment as soon as possible for a visit in 2 week(s).   Specialty:  Orthopedic Surgery Contact information: [place] [address] [phone]            Contact information for after-discharge care    Destination    [facility] Follow up.   Specialty:  Skilled Nursing Facility Contact information: [address] [place] [phone]                   The results of significant diagnostics from this hospitalization (including imaging, microbiology, ancillary and laboratory) are listed below for reference.    Significant Diagnostic Studies: Dg Chest 1 View  Result Date: 11/10/2016 CLINICAL DATA:  75 year old female status post mechanical fall earlier today. Right hip pain. EXAM: CHEST 1 VIEW COMPARISON:  Prior chest x-ray 07/03/2016 FINDINGS: Stable mild cardiomegaly. Mediastinal contours remain within normal limits. Atherosclerotic calcifications again noted in the transverse aorta. Chronic lingular scarring/ atelectasis is similar compared to prior imaging. No evidence of pneumothorax, new airspace opacity or pulmonary nodule. No acute osseous abnormality. IMPRESSION: 1. Stable chest x-ray without evidence of acute cardiopulmonary process. 2.  Aortic Atherosclerosis (ICD10-170.0) 3. Chronic lingular atelectasis/scarring. Electronically Signed   By: Jacqulynn Cadet M.D.   On: 11/10/2016 09:55   Ct Head Wo Contrast  Result Date: 11/10/2016 CLINICAL DATA:  Fall today. Right frontotemporal headaches with right neck tenderness. EXAM: CT HEAD WITHOUT CONTRAST CT CERVICAL SPINE WITHOUT CONTRAST TECHNIQUE: Multidetector CT imaging of the head and  cervical spine was performed following the standard protocol without intravenous contrast. Multiplanar CT image reconstructions of the cervical spine were also generated. COMPARISON:  CT head 04/20/2016 and 01/02/2016. FINDINGS: CT HEAD FINDINGS Brain: There is no evidence of acute intracranial hemorrhage, mass lesion, brain edema or extra-axial fluid collection. Stable atrophy with disproportionate dilatation of the lateral ventricles. There is stable chronic periventricular white matter disease and chronic lacunar infarcts in the right thalamus and lentiform nucleus. There is no CT evidence of acute cortical infarction. Vascular: Intracranial vascular calcifications are present. No hyperdense vessels identified. Skull: Negative for fracture or focal lesion. Sinuses/Orbits: The visualized paranasal sinuses and mastoid air cells are clear. No orbital abnormalities are  seen. Other: None. CT CERVICAL SPINE FINDINGS Alignment: Normal. Skull base and vertebrae: No evidence of acute cervical spine fracture or traumatic subluxation. There is multilevel spondylosis with asymmetric facet arthropathy on the right. Ossification of the ligamentum nuchae noted. Soft tissues and spinal canal: No prevertebral fluid or swelling. No visible canal hematoma. Bilateral carotid atherosclerosis. Disc levels: No large disc herniation or spinal canal compromise identified. There is multilevel facet arthropathy with probable ankylosis of the right C3-4 facet joint. No high-grade foraminal narrowing. Upper chest: The visualized lung apices are clear. There is atherosclerosis of the great vessels. Other: None. IMPRESSION: 1. Stable head CT demonstrating atrophy with disproportionate ventricular dilatation and chronic periventricular white matter disease. No acute intracranial findings. 2. No evidence of acute cervical spine fracture, traumatic subluxation or static signs of instability. Multilevel cervical spondylosis. 3. Severe  atherosclerosis. Electronically Signed   By: Richardean Sale M.D.   On: 11/10/2016 10:30   Ct Cervical Spine Wo Contrast  Result Date: 11/10/2016 CLINICAL DATA:  Fall today. Right frontotemporal headaches with right neck tenderness. EXAM: CT HEAD WITHOUT CONTRAST CT CERVICAL SPINE WITHOUT CONTRAST TECHNIQUE: Multidetector CT imaging of the head and cervical spine was performed following the standard protocol without intravenous contrast. Multiplanar CT image reconstructions of the cervical spine were also generated. COMPARISON:  CT head 04/20/2016 and 01/02/2016. FINDINGS: CT HEAD FINDINGS Brain: There is no evidence of acute intracranial hemorrhage, mass lesion, brain edema or extra-axial fluid collection. Stable atrophy with disproportionate dilatation of the lateral ventricles. There is stable chronic periventricular white matter disease and chronic lacunar infarcts in the right thalamus and lentiform nucleus. There is no CT evidence of acute cortical infarction. Vascular: Intracranial vascular calcifications are present. No hyperdense vessels identified. Skull: Negative for fracture or focal lesion. Sinuses/Orbits: The visualized paranasal sinuses and mastoid air cells are clear. No orbital abnormalities are seen. Other: None. CT CERVICAL SPINE FINDINGS Alignment: Normal. Skull base and vertebrae: No evidence of acute cervical spine fracture or traumatic subluxation. There is multilevel spondylosis with asymmetric facet arthropathy on the right. Ossification of the ligamentum nuchae noted. Soft tissues and spinal canal: No prevertebral fluid or swelling. No visible canal hematoma. Bilateral carotid atherosclerosis. Disc levels: No large disc herniation or spinal canal compromise identified. There is multilevel facet arthropathy with probable ankylosis of the right C3-4 facet joint. No high-grade foraminal narrowing. Upper chest: The visualized lung apices are clear. There is atherosclerosis of the great  vessels. Other: None. IMPRESSION: 1. Stable head CT demonstrating atrophy with disproportionate ventricular dilatation and chronic periventricular white matter disease. No acute intracranial findings. 2. No evidence of acute cervical spine fracture, traumatic subluxation or static signs of instability. Multilevel cervical spondylosis. 3. Severe atherosclerosis. Electronically Signed   By: Richardean Sale M.D.   On: 11/10/2016 10:30   Dg Hip Port Unilat With Pelvis 1v Right  Result Date: 11/10/2016 CLINICAL DATA:  Status post ORIF EXAM: DG HIP (WITH OR WITHOUT PELVIS) 1V PORT RIGHT COMPARISON:  11/10/2016 FINDINGS: The patient is status post right hip replacement with anatomic alignment. Pubic symphysis appears intact. Small amount of soft tissue gas lateral to the right hip is felt postsurgical. IMPRESSION: Status post right hip replacement with expected postsurgical changes Electronically Signed   By: Donavan Foil M.D.   On: 11/10/2016 23:13   Dg Hip Unilat With Pelvis 2-3 Views Right  Result Date: 11/10/2016 CLINICAL DATA:  Status post fall in the 9 hall today. Persistent right anterior hip pain made worse with  movement. EXAM: DG HIP (WITH OR WITHOUT PELVIS) 2-3V RIGHT COMPARISON:  Coronal and sagittal images from an abdominal and pelvic CT scan dated April 27, 2016 FINDINGS: There is an acute impacted fracture of the subcapital region of the right hip. The femoral head remains normally related to the right hip joint. The joint space is well maintained. The intertrochanteric and sub trochanteric regions are normal. The observed bony pelvis exhibits no acute fracture. There is diffuse osteopenia. IMPRESSION: There is an acute impacted subcapital fracture of the right hip. Electronically Signed   By: David  Martinique M.D.   On: 11/10/2016 09:53   Dg Femur, Min 2 Views Right  Result Date: 11/11/2016 CLINICAL DATA:  Status post right hip fracture EXAM: RIGHT FEMUR 2 VIEWS COMPARISON:  None. FINDINGS: Status  post right hip hemiarthroplasty. No evidence of complication. Mild degenerative changes of the knee. Soft tissue gas overlying the hip, postsurgical. IMPRESSION: Status post right hip hemiarthroplasty, with associated postsurgical changes. Electronically Signed   By: Julian Hy M.D.   On: 11/11/2016 13:44    Microbiology: Recent Results (from the past 240 hour(s))  Urine culture     Status: Abnormal   Collection Time: 11/10/16 12:55 PM  Result Value Ref Range Status   Specimen Description URINE, CATHETERIZED  Final   Special Requests NONE  Final   Culture >=100,000 COLONIES/mL PROTEUS MIRABILIS (A)  Final   Report Status 11/12/2016 FINAL  Final   Organism ID, Bacteria PROTEUS MIRABILIS (A)  Final      Susceptibility   Proteus mirabilis - MIC*    AMPICILLIN <=2 SENSITIVE Sensitive     CEFAZOLIN <=4 SENSITIVE Sensitive     CEFTRIAXONE <=1 SENSITIVE Sensitive     CIPROFLOXACIN <=0.25 SENSITIVE Sensitive     GENTAMICIN <=1 SENSITIVE Sensitive     IMIPENEM 2 SENSITIVE Sensitive     NITROFURANTOIN 256 RESISTANT Resistant     TRIMETH/SULFA <=20 SENSITIVE Sensitive     AMPICILLIN/SULBACTAM <=2 SENSITIVE Sensitive     PIP/TAZO <=4 SENSITIVE Sensitive     * >=100,000 COLONIES/mL PROTEUS MIRABILIS  Surgical pcr screen     Status: Abnormal   Collection Time: 11/10/16 12:56 PM  Result Value Ref Range Status   MRSA, PCR NEGATIVE NEGATIVE Final   Staphylococcus aureus POSITIVE (A) NEGATIVE Final    Comment:        The Xpert SA Assay (FDA approved for NASAL specimens in patients over 70 years of age), is one component of a comprehensive surveillance program.  Test performance has been validated by [facility] for patients greater than or equal to 92 year old. It is not intended to diagnose infection nor to guide or monitor treatment.   Urine culture     Status: Abnormal   Collection Time: 11/13/16  1:25 PM  Result Value Ref Range Status   Specimen Description URINE, RANDOM   Final   Special Requests NONE  Final   Culture (A)  Final    >=100,000 COLONIES/mL ESCHERICHIA COLI 50,000 COLONIES/mL PROTEUS MIRABILIS THE ESCHERICHIA COLI IS A Confirmed Extended Spectrum Beta-Lactamase Producer (ESBL)    Report Status 11/15/2016 FINAL  Final   Organism ID, Bacteria ESCHERICHIA COLI (A)  Final   Organism ID, Bacteria PROTEUS MIRABILIS (A)  Final      Susceptibility   Escherichia coli - MIC*    AMPICILLIN >=32 RESISTANT Resistant     CEFAZOLIN >=64 RESISTANT Resistant     CEFTRIAXONE >=64 RESISTANT Resistant     CIPROFLOXACIN >=4  RESISTANT Resistant     GENTAMICIN <=1 SENSITIVE Sensitive     IMIPENEM <=0.25 SENSITIVE Sensitive     NITROFURANTOIN <=16 SENSITIVE Sensitive     TRIMETH/SULFA <=20 SENSITIVE Sensitive     AMPICILLIN/SULBACTAM 4 SENSITIVE Sensitive     PIP/TAZO <=4 SENSITIVE Sensitive     Extended ESBL POSITIVE Resistant     * >=100,000 COLONIES/mL ESCHERICHIA COLI   Proteus mirabilis - MIC*    AMPICILLIN <=2 SENSITIVE Sensitive     CEFAZOLIN <=4 SENSITIVE Sensitive     CEFTRIAXONE <=1 SENSITIVE Sensitive     CIPROFLOXACIN <=0.25 SENSITIVE Sensitive     GENTAMICIN <=1 SENSITIVE Sensitive     IMIPENEM 2 SENSITIVE Sensitive     NITROFURANTOIN 128 RESISTANT Resistant     TRIMETH/SULFA <=20 SENSITIVE Sensitive     AMPICILLIN/SULBACTAM <=2 SENSITIVE Sensitive     PIP/TAZO <=4 SENSITIVE Sensitive     * 50,000 COLONIES/mL PROTEUS MIRABILIS     Labs: Basic Metabolic Panel:  Recent Labs Lab 11/10/16 0920 11/11/16 0228 11/12/16 0633 11/16/16 0522  NA 138 137 137 138  K 4.2 4.6 3.8 3.6  CL 101 101 102 103  CO2 28 24 26 31   GLUCOSE 164* 227* 216* 220*  BUN 17 17 12 11   CREATININE 0.93 0.89 0.73 0.77  CALCIUM 9.5 8.4* 8.1* 8.2*   Liver Function Tests: No results for input(s): AST, ALT, ALKPHOS, BILITOT, PROT, ALBUMIN in the last 168 hours. No results for input(s): LIPASE, AMYLASE in the last 168 hours. No results for input(s): AMMONIA in the  last 168 hours. CBC:  Recent Labs Lab 11/10/16 0920 11/11/16 0228 11/12/16 0633 11/13/16 0508 11/16/16 0522  WBC 13.0* 13.8* 11.3* 12.1* 8.9  NEUTROABS 10.3*  --   --   --   --   HGB 13.8 11.9* 9.9* 10.2* 10.2*  HCT 41.0 35.2* 30.0* 31.1* 31.4*  MCV 98.1 97.2 97.7 100.6* 100.3*  PLT 198 177 169 135* 200   Cardiac Enzymes: No results for input(s): CKTOTAL, CKMB, CKMBINDEX, TROPONINI in the last 168 hours. BNP: BNP (last 3 results)  Recent Labs  01/02/16 0142  BNP 71.8    ProBNP (last 3 results) No results for input(s): PROBNP in the last 8760 hours.  CBG:  Recent Labs Lab 11/15/16 1113 11/15/16 1727 11/15/16 2209 11/16/16 0653 11/16/16 1128  GLUCAP 242* 164* 209* 190* 201*       SignedDomenic Polite MD.  [facility] 11/16/2016, 11:39 AM

## 2016-11-17 ENCOUNTER — Encounter: Payer: Self-pay | Admitting: Internal Medicine

## 2016-11-17 ENCOUNTER — Non-Acute Institutional Stay (SKILLED_NURSING_FACILITY): Payer: Medicare Other | Admitting: Internal Medicine

## 2016-11-17 DIAGNOSIS — I25118 Atherosclerotic heart disease of native coronary artery with other forms of angina pectoris: Secondary | ICD-10-CM | POA: Diagnosis not present

## 2016-11-17 DIAGNOSIS — Z1612 Extended spectrum beta lactamase (ESBL) resistance: Secondary | ICD-10-CM

## 2016-11-17 DIAGNOSIS — I10 Essential (primary) hypertension: Secondary | ICD-10-CM

## 2016-11-17 DIAGNOSIS — E785 Hyperlipidemia, unspecified: Secondary | ICD-10-CM

## 2016-11-17 DIAGNOSIS — S72001D Fracture of unspecified part of neck of right femur, subsequent encounter for closed fracture with routine healing: Secondary | ICD-10-CM

## 2016-11-17 DIAGNOSIS — D62 Acute posthemorrhagic anemia: Secondary | ICD-10-CM | POA: Diagnosis not present

## 2016-11-17 DIAGNOSIS — A498 Other bacterial infections of unspecified site: Secondary | ICD-10-CM

## 2016-11-17 DIAGNOSIS — N390 Urinary tract infection, site not specified: Secondary | ICD-10-CM

## 2016-11-17 DIAGNOSIS — B964 Proteus (mirabilis) (morganii) as the cause of diseases classified elsewhere: Secondary | ICD-10-CM

## 2016-11-17 DIAGNOSIS — I5032 Chronic diastolic (congestive) heart failure: Secondary | ICD-10-CM | POA: Diagnosis not present

## 2016-11-17 DIAGNOSIS — F0390 Unspecified dementia without behavioral disturbance: Secondary | ICD-10-CM

## 2016-11-17 DIAGNOSIS — B9629 Other Escherichia coli [E. coli] as the cause of diseases classified elsewhere: Secondary | ICD-10-CM

## 2016-11-17 NOTE — Progress Notes (Signed)
: Provider:  Noah Delaine. [name], MD Location:  [place] Room Number: [id number] Place of Service:  SNF ([phone])  PCP: Vidal Schwalbe, MD Patient Care Team: Harlan Stains, MD as PCP - General (Family Medicine)  Extended Emergency Contact Information Primary Emergency Contact: [name],[name] Address: [address]          [place], [place] [address] [name] of [place] Phone: [phone] Work Phone: [phone] Mobile Phone: [phone] Relation: Daughter Secondary Emergency Contact: [name],[name]  [place] Phone: [phone] Relation: Niece     Allergies: Lisinopril; Spironolactone; Vancomycin; Donepezil; Eggs or egg-derived products; Erythromycin; Macrolides and ketolides; Penicillins; Quinapril hcl; Angiotensin receptor blockers; Lipitor [atorvastatin]; Pentazocine lactate; Propoxyphene hcl; and Sulfonamide derivatives  Chief Complaint  Patient presents with  . New Admit To SNF    Admit to Facility    HPI: Patient is 75 y.o. female with COPD and tobacco abuse, dementia, chronic systolic heart failure, CAD with last cardiac catheterization 2016 demonstrating patent stent,and fibromyalgia. Patient with mechanical fall and dining hall and found to have a R hip fracture. Pt was admitted to [facility] from 12/22-28 where she underwent a r\R hemiarthroplasty on 12/22. Hospital course was complicated by an ESBL and proteus UTI, with IV then po antibiotics. Also pt with post-op anemia, not requiring a transfusion. Pt is admitted to SNF for OT/PT. While at SNF pt will be followed for DM2 tx with insulin, HTN, tx with lasix and toprol, and CAD, tx with Imdur, plavix toprol and SL NTG.  Past Medical History:  Diagnosis Date  . Anxiety   . CAD (coronary artery disease)    a. s/p inferior STEMI 12/29/10 with rotablator atherectomy RCA 12/31/10 and DES x 2 RCA;  b. Lexiscan Myoview (02/2015): mild reversible apical anterior perfusion defect. C. cath  02/2015 50% LAD lesion with patent stent, Tx Rx    . COPD (chronic obstructive pulmonary disease) ([place])   . Dementia   . Diabetes mellitus    type 2  . Diverticulosis   . GERD (gastroesophageal reflux disease)   . Glaucoma   . HTN (hypertension)   . Hyperlipidemia   . Ischemic cardiomyopathy    EF 45%cath 2012 EF55% 5/12; 35% 11/14  . Memory deficit   . Nephrolithiasis   . Osteoarthritis   . Overweight(278.02)   . Respiratory arrest//ACE Inhibitor presumed cause   . Sleep apnea   . Small bowel obstruction    from a sigmoid stricture    Past Surgical History:  Procedure Laterality Date  . bilateral tubal ligation    . CARDIAC CATHETERIZATION N/A 03/18/2015   Procedure: LEFT HEART CATH AND CORONARY ANGIOGRAPHY;  Surgeon: Lorretta Harp, MD;  Location: [facility] CATH LAB;  Service: Cardiovascular;  Laterality: N/A;  . CARDIAC SURGERY    . HIP ARTHROPLASTY Right 11/10/2016   Procedure: ARTHROPLASTY BIPOLAR HIP (HEMIARTHROPLASTY);  Surgeon: Marchia Bond, MD;  Location: [facility];  Service: Orthopedics;  Laterality: Right;  . 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 LITHOTRIPSY      Allergies as of 11/17/2016      Reactions   Lisinopril Other (See Comments)   Reaction:  Respiratory arrest    Spironolactone Anaphylaxis   Vancomycin Anaphylaxis, Rash   Donepezil Diarrhea   Eggs Or Egg-derived Products Diarrhea   Erythromycin Other (See Comments)   Reaction:  Makes pt feel weird    Macrolides And Ketolides Other (See Comments)   Reaction:  Unknown    Penicillins Itching, Other (See Comments)   Has patient had a PCN reaction causing immediate rash, facial/tongue/throat swelling, SOB or lightheadedness with hypotension: No Has patient had a PCN reaction causing severe rash involving mucus membranes or skin necrosis: No Has patient had a PCN reaction that required hospitalization No Has patient had a PCN reaction occurring within the last 10 years: No If all  of the above answers are "NO", then may proceed with Cephalosporin use.   Quinapril Hcl Itching   Angiotensin Receptor Blockers Other (See Comments)   Reaction:  Unknown    Lipitor [atorvastatin] Palpitations   Pentazocine Lactate Other (See Comments)   Reaction:  Unknown    Propoxyphene Hcl Other (See Comments)   Reaction unknown   Sulfonamide Derivatives Other (See Comments)   Reaction:  Unknown       Medication List       Accurate as of 11/17/16  9:57 AM. Always use your most recent med list.          acetaminophen 325 MG tablet Commonly known as:  TYLENOL Take 2 tablets (650 mg total) by mouth every 6 (six) hours as needed.   albuterol 0.63 MG/3ML nebulizer solution Commonly known as:  ACCUNEB Take 1 ampule by nebulization daily.   albuterol 108 (90 Base) MCG/ACT inhaler Commonly known as:  PROVENTIL HFA;VENTOLIN HFA Inhale 2 puffs into the lungs every 6 (six) hours as needed for wheezing or shortness of breath.   atorvastatin 40 MG tablet Commonly known as:  LIPITOR Take 40 mg by mouth daily.   cephALEXin 250 MG capsule Commonly known as:  KEFLEX Take 1 capsule (250 mg total) by mouth 4 (four) times daily. For 2days, was able to tolerate cephalosporins in the past   clopidogrel 75 MG tablet Commonly known as:  PLAVIX Take 75 mg by mouth daily.   enoxaparin 40 MG/0.4ML injection Commonly known as:  LOVENOX Inject 0.4 mLs (40 mg total) into the skin daily.   furosemide 20 MG tablet Commonly known as:  LASIX Take 20 mg by mouth daily.   HUMALOG KWIKPEN 100 UNIT/ML KiwkPen Generic drug:  insulin lispro Inject 4 Units into the skin 3 (three) times daily with meals.   HYDROcodone-acetaminophen 5-325 MG tablet Commonly known as:  NORCO/VICODIN Take 1 tablet by mouth every 6 (six) hours as needed for moderate pain.   insulin glargine 100 unit/mL Sopn Commonly known as:  LANTUS Inject 0.15 mLs (15 Units total) into the skin at bedtime.   ipratropium 0.02 %  nebulizer solution Commonly known as:  ATROVENT Take 0.5 mg by nebulization daily.   isosorbide mononitrate 30 MG 24 hr tablet Commonly known as:  IMDUR Take 0.5 tablets (15 mg total) by mouth daily.   LORazepam 0.5 MG tablet Commonly known as:  ATIVAN Take 1 tablet (0.5 mg total) by mouth 2 (two) times daily as needed for anxiety.   memantine 10 MG tablet Commonly known as:  NAMENDA Take 10 mg by mouth 2 (two) times daily.   metFORMIN 1000 MG tablet Commonly known as:  GLUCOPHAGE Take 1,000 mg by mouth 2 (two) times daily with a meal.   metoprolol succinate 25 MG 24 hr tablet Commonly known as:  TOPROL XL Take 0.5 tablets (12.5 mg total) by mouth daily.   mirtazapine 15 MG disintegrating tablet Commonly known as:  REMERON SOL-TAB Take 1 tablet (15 mg total) by mouth at bedtime.   mometasone-formoterol 100-5 MCG/ACT Aero Commonly known as:  [employer]  Inhale 2 puffs into the lungs 2 (two) times daily.   nitrofurantoin 100 MG capsule Commonly known as:  MACRODANTIN Take 1 capsule (100 mg total) by mouth 2 (two) times daily. For 2days   nitroGLYCERIN 0.4 MG SL tablet Commonly known as:  NITROSTAT Place 0.4 mg under the tongue every 5 (five) minutes as needed for chest pain.   omeprazole 20 MG capsule Commonly known as:  PRILOSEC Take 20 mg by mouth daily before breakfast.   polyethylene glycol packet Commonly known as:  MIRALAX / GLYCOLAX Take 17 g by mouth daily as needed for mild constipation.   sennosides-docusate sodium 8.6-50 MG tablet Commonly known as:  SENOKOT-S Take 2 tablets by mouth daily.       No orders of the defined types were placed in this encounter.   Immunization History  Administered Date(s) Administered  . Influenza Split 08/20/2014  . Influenza, High Dose Seasonal PF 10/06/2013  . PPD Test 11/16/2016  . Pneumococcal Polysaccharide-23 09/24/2009    Social History  Substance Use Topics  . Smoking status: Current Every Day Smoker     Packs/day: 1.00    Years: 25.00    Types: Cigarettes  . Smokeless tobacco: Never Used     Comment: over a ppd for 40+ years; quit 10/2004.  unsuccessfully tried eCigs.  . Alcohol use No    Family history is   Family History  Problem Relation Age of Onset  . Diabetes Father   . Heart disease Father   . Heart attack Father   . Stroke Mother   . Breast cancer Maternal Grandmother   . Diabetes Sister   . Hypertension Sister   . Colon cancer Neg Hx       Review of Systems  DATA OBTAINED: from patient, nurse GENERAL:  no fevers, fatigue, appetite changes SKIN: No itching, or rash EYES: No eye pain, redness, discharge EARS: No earache, tinnitus, change in hearing NOSE: No congestion, drainage or bleeding  MOUTH/THROAT: No mouth or tooth pain, No sore throat RESPIRATORY: No cough, wheezing, SOB CARDIAC: No chest pain, palpitations, lower extremity edema  GI: No abdominal pain, No N/V/D or constipation, No heartburn or reflux  GU: No dysuria, frequency or urgency, or incontinence  MUSCULOSKELETAL: No unrelieved bone/joint pain NEUROLOGIC: No headache, dizziness or focal weakness PSYCHIATRIC: No c/o anxiety or sadness   Vitals:   11/17/16 0940  BP: 115/60  Pulse: 87  Resp: 18  Temp: 99.7 F (37.6 C)    SpO2 Readings from Last 1 Encounters:  11/17/16 99%   Body mass index is 26.63 kg/m.     Physical Exam  GENERAL APPEARANCE: Alert, conversant,  No acute distress.  SKIN: No diaphoresis rash; incision looks fine HEAD: Normocephalic, atraumatic  EYES: Conjunctiva/lids clear. Pupils round, reactive. EOMs intact.  EARS: External exam WNL, canals clear. Hearing grossly normal.  NOSE: No deformity or discharge.  MOUTH/THROAT: Lips w/o lesions  RESPIRATORY: Breathing is even, unlabored. Lung sounds are clear   CARDIOVASCULAR: Heart RRR no murmurs, rubs or gallops. No peripheral edema.   GASTROINTESTINAL: Abdomen is soft, non-tender, not distended w/ normal bowel  sounds. GENITOURINARY: Bladder non tender, not distended  MUSCULOSKELETAL: No abnormal joints or musculature NEUROLOGIC:  Cranial nerves 2-12 grossly intact. Moves all extremities  PSYCHIATRIC: Mood and affect appropriate to situation, no behavioral issues  Patient Active Problem List   Diagnosis Date Noted  . Chronic diastolic CHF (congestive heart failure) ([place]) 11/12/2016  . Closed right hip fracture ([place]) 11/10/2016  .  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 Obesity (BMI 30-39.9) 10/06/2015  . Tobacco use disorder 03/17/2015  . Cardiomyopathy, ischemic 03/17/2015  . Allergy to ACE inhibitors 03/17/2015  . Ventricular tachycardia ([place]) 03/17/2015  . COPD (chronic obstructive pulmonary disease) ([place]) 12/31/2014  . Chronic cough 09/12/2013  . Memory deficit 04/23/2013  . Essential hypertension, benign 04/03/2013  . Dyslipidemia 04/03/2013  . Coronary atherosclerosis of native coronary artery 04/03/2013  . DM2 (diabetes mellitus, type 2) ([place]) 04/02/2013  . Fibromyalgia 04/20/2008      Labs reviewed: Basic Metabolic Panel:    Component Value Date/Time   NA 138 11/16/2016 0522   NA 138 11/16/2016 0522   K 3.6 11/16/2016 0522   CL 103 11/16/2016 0522   CO2 31 11/16/2016 0522   GLUCOSE 220 (H) 11/16/2016 0522   BUN 11 11/16/2016 0522   BUN 11 11/16/2016 0522   CREATININE 0.77 11/16/2016 0522   CREATININE 0.8 11/16/2016 0522   CALCIUM 8.2 (L) 11/16/2016 0522   PROT 7.7 04/27/2016 1724   ALBUMIN 4.4 04/27/2016 1724   AST 22 04/27/2016 1724   ALT 9 (L) 04/27/2016 1724   ALKPHOS 69 04/27/2016 1724   BILITOT 1.2 04/27/2016 1724   GFRNONAA >60 11/16/2016 0522   GFRAA >60 11/16/2016 0522     Recent Labs  01/04/16 0741  02/04/16 2258 02/05/16 0527  11/11/16 0228 11/12/16 0633 11/16/16 0522  NA 144  < > 139 142  < > 137  137 137  137 138  138  K 5.8*  < > 3.7 4.0  < > 4.6  4.6 3.8  3.8 3.6  CL 103  < > 102 102  < > 101 102 103  CO2 26  <  > 27 28  < > 24 26 31   GLUCOSE 206*  < > 224* 178*  < > 227* 216* 220*  BUN 12  < > 20 21*  < > 17  17 12  12 11  11   CREATININE 0.94  < > 0.97 0.92  < > 0.89  0.9 0.73  0.7 0.77  0.8  CALCIUM 9.4  < > 8.8* 9.3  < > 8.4* 8.1* 8.2*  MG 2.0  --  1.5* 2.2  --   --   --   --   < > = values in this interval not displayed. Liver Function Tests:  Recent Labs  01/01/16 2314 02/28/16 1321 04/27/16 1724  AST 21 14* 22  ALT 13* 11* 9*  ALKPHOS 73 60 69  BILITOT 0.7 0.6 1.2  PROT 7.4 7.0 7.7  ALBUMIN 3.7 3.8 4.4    Recent Labs  02/28/16 1321 04/27/16 1724  LIPASE 20 29    Recent Labs  01/02/16 1002  AMMONIA 26   CBC:  Recent Labs  04/20/16 2200  07/03/16 1831 11/10/16 0920  11/12/16 0633 11/13/16 0508 11/16/16 0522  WBC 10.8*  < > 10.4 13.0  13.0*  < > 11.3  11.3* 12.1  12.1* 5.9  8.9  NEUTROABS 7.4  --  9.0* 10.3*  --   --   --   --   HGB 13.1  < > 13.4 13.8  13.8  < > 9.9*  9.9* 10.2*  10.2* 10.2*  HCT 39.1  < > 39.1 41.0  41  < > 30.0*  30* 31.1*  31* 31.4*  MCV 96.3  < > 97.3 98.1  < > 97.7 100.6* 100.3*  PLT 161  < > 162 198  198  < > 169  169 135*  135* 200  < > = values in this interval not displayed. Lipid No results for input(s): CHOL, HDL, LDLCALC, TRIG in the last 8760 hours.  Cardiac Enzymes:  Recent Labs  02/04/16 0454 02/04/16 1022 02/05/16 0925  TROPONINI <0.03 <0.03 0.05*   BNP:  Recent Labs  01/02/16 0142  BNP 71.8   No results found for: [facility] Lab Results  Component Value Date   HGBA1C 7.4 (H) 01/02/2016   Lab Results  Component Value Date   TSH 1.82 10/30/2012   No results found for: VITAMINB12 No results found for: FOLATE No results found for: IRON, TIBC, FERRITIN  Imaging and Procedures obtained prior to SNF admission: Dg Chest 1 View  Result Date: 11/10/2016 CLINICAL DATA:  75 year old female status post mechanical fall earlier today. Right hip pain. EXAM: CHEST 1 VIEW COMPARISON:  Prior chest  x-ray 07/03/2016 FINDINGS: Stable mild cardiomegaly. Mediastinal contours remain within normal limits. Atherosclerotic calcifications again noted in the transverse aorta. Chronic lingular scarring/ atelectasis is similar compared to prior imaging. No evidence of pneumothorax, new airspace opacity or pulmonary nodule. No acute osseous abnormality. IMPRESSION: 1. Stable chest x-ray without evidence of acute cardiopulmonary process. 2.  Aortic Atherosclerosis (ICD10-170.0) 3. Chronic lingular atelectasis/scarring. Electronically Signed   By: Jacqulynn Cadet M.D.   On: 11/10/2016 09:55   Ct Head Wo Contrast  Result Date: 11/10/2016 CLINICAL DATA:  Fall today. Right frontotemporal headaches with right neck tenderness. EXAM: CT HEAD WITHOUT CONTRAST CT CERVICAL SPINE WITHOUT CONTRAST TECHNIQUE: Multidetector CT imaging of the head and cervical spine was performed following the standard protocol without intravenous contrast. Multiplanar CT image reconstructions of the cervical spine were also generated. COMPARISON:  CT head 04/20/2016 and 01/02/2016. FINDINGS: CT HEAD FINDINGS Brain: There is no evidence of acute intracranial hemorrhage, mass lesion, brain edema or extra-axial fluid collection. Stable atrophy with disproportionate dilatation of the lateral ventricles. There is stable chronic periventricular white matter disease and chronic lacunar infarcts in the right thalamus and lentiform nucleus. There is no CT evidence of acute cortical infarction. Vascular: Intracranial vascular calcifications are present. No hyperdense vessels identified. Skull: Negative for fracture or focal lesion. Sinuses/Orbits: The visualized paranasal sinuses and mastoid air cells are clear. No orbital abnormalities are seen. Other: None. CT CERVICAL SPINE FINDINGS Alignment: Normal. Skull base and vertebrae: No evidence of acute cervical spine fracture or traumatic subluxation. There is multilevel spondylosis with asymmetric facet  arthropathy on the right. Ossification of the ligamentum nuchae noted. Soft tissues and spinal canal: No prevertebral fluid or swelling. No visible canal hematoma. Bilateral carotid atherosclerosis. Disc levels: No large disc herniation or spinal canal compromise identified. There is multilevel facet arthropathy with probable ankylosis of the right C3-4 facet joint. No high-grade foraminal narrowing. Upper chest: The visualized lung apices are clear. There is atherosclerosis of the great vessels. Other: None. IMPRESSION: 1. Stable head CT demonstrating atrophy with disproportionate ventricular dilatation and chronic periventricular white matter disease. No acute intracranial findings. 2. No evidence of acute cervical spine fracture, traumatic subluxation or static signs of instability. Multilevel cervical spondylosis. 3. Severe atherosclerosis. Electronically Signed   By: Richardean Sale M.D.   On: 11/10/2016 10:30   Ct Cervical Spine Wo Contrast  Result Date: 11/10/2016 CLINICAL DATA:  Fall today. Right frontotemporal headaches with right neck tenderness. EXAM: CT HEAD WITHOUT CONTRAST CT CERVICAL SPINE WITHOUT CONTRAST TECHNIQUE: Multidetector CT imaging of the head and cervical spine was performed following the  standard protocol without intravenous contrast. Multiplanar CT image reconstructions of the cervical spine were also generated. COMPARISON:  CT head 04/20/2016 and 01/02/2016. FINDINGS: CT HEAD FINDINGS Brain: There is no evidence of acute intracranial hemorrhage, mass lesion, brain edema or extra-axial fluid collection. Stable atrophy with disproportionate dilatation of the lateral ventricles. There is stable chronic periventricular white matter disease and chronic lacunar infarcts in the right thalamus and lentiform nucleus. There is no CT evidence of acute cortical infarction. Vascular: Intracranial vascular calcifications are present. No hyperdense vessels identified. Skull: Negative for fracture  or focal lesion. Sinuses/Orbits: The visualized paranasal sinuses and mastoid air cells are clear. No orbital abnormalities are seen. Other: None. CT CERVICAL SPINE FINDINGS Alignment: Normal. Skull base and vertebrae: No evidence of acute cervical spine fracture or traumatic subluxation. There is multilevel spondylosis with asymmetric facet arthropathy on the right. Ossification of the ligamentum nuchae noted. Soft tissues and spinal canal: No prevertebral fluid or swelling. No visible canal hematoma. Bilateral carotid atherosclerosis. Disc levels: No large disc herniation or spinal canal compromise identified. There is multilevel facet arthropathy with probable ankylosis of the right C3-4 facet joint. No high-grade foraminal narrowing. Upper chest: The visualized lung apices are clear. There is atherosclerosis of the great vessels. Other: None. IMPRESSION: 1. Stable head CT demonstrating atrophy with disproportionate ventricular dilatation and chronic periventricular white matter disease. No acute intracranial findings. 2. No evidence of acute cervical spine fracture, traumatic subluxation or static signs of instability. Multilevel cervical spondylosis. 3. Severe atherosclerosis. Electronically Signed   By: Richardean Sale M.D.   On: 11/10/2016 10:30   Dg Hip Port Unilat With Pelvis 1v Right  Result Date: 11/10/2016 CLINICAL DATA:  Status post ORIF EXAM: DG HIP (WITH OR WITHOUT PELVIS) 1V PORT RIGHT COMPARISON:  11/10/2016 FINDINGS: The patient is status post right hip replacement with anatomic alignment. Pubic symphysis appears intact. Small amount of soft tissue gas lateral to the right hip is felt postsurgical. IMPRESSION: Status post right hip replacement with expected postsurgical changes Electronically Signed   By: Donavan Foil M.D.   On: 11/10/2016 23:13   Dg Hip Unilat With Pelvis 2-3 Views Right  Result Date: 11/10/2016 CLINICAL DATA:  Status post fall in the 9 hall today. Persistent right  anterior hip pain made worse with movement. EXAM: DG HIP (WITH OR WITHOUT PELVIS) 2-3V RIGHT COMPARISON:  Coronal and sagittal images from an abdominal and pelvic CT scan dated April 27, 2016 FINDINGS: There is an acute impacted fracture of the subcapital region of the right hip. The femoral head remains normally related to the right hip joint. The joint space is well maintained. The intertrochanteric and sub trochanteric regions are normal. The observed bony pelvis exhibits no acute fracture. There is diffuse osteopenia. IMPRESSION: There is an acute impacted subcapital fracture of the right hip. Electronically Signed   By: David  [place] M.D.   On: 11/10/2016 09:53   Dg Femur, Min 2 Views Right  Result Date: 11/11/2016 CLINICAL DATA:  Status post right hip fracture EXAM: RIGHT FEMUR 2 VIEWS COMPARISON:  None. FINDINGS: Status post right hip hemiarthroplasty. No evidence of complication. Mild degenerative changes of the knee. Soft tissue gas overlying the hip, postsurgical. IMPRESSION: Status post right hip hemiarthroplasty, with associated postsurgical changes. Electronically Signed   By: Julian Hy M.D.   On: 11/11/2016 13:44     Not all labs, radiology exams or other studies done during hospitalization come through on my EPIC note; however they are reviewed by  me.    Assessment and Plan  R HIP FRACTURE- s/p mechanical fall - Ortho consulted, s/p right hemiarthroplasty on 12/22 - management per ortho-lovenox for DVT prophylaxis at DC for 3-4weeks SNF - admit for OT/PT; cont lovenox for 4 weeks  CAD-Underwent Rotablator with drug-eluting stent 2 to RCA in 2012 with catheterization in 2016 demonstrating patent stent SNF - Cont plavix, holdASA while on lovenox; cont imdur, bblocker and lipitor  ESBL E COLI AND PROTEUS UTI - treated with Imipenem x2days and transitioned to PO keflex and nitrofurantoin for 2days to cover both organisms  SNF - cont keflex and macrobiD for 2 more  days  ANEMIA, POST-OP -d/c Hb 10.2 SNF - f/u CBC  HTN SNF- stable; cont Toprol XL 12.5 mg daly and lasix 20 mg daily  CHRONIC dCHF- Most recent echocardiogram demonstrates preserved LV function but patient does have mild grade 1 diastolic dysfunction which is compensated SNF - cot lasix 20 mg daily and toprol 12.5 mg daily  HLD SNF - stable ;cont lipitor 40 mg daily  [facility] snf - STABLE ; CONT NAMENDA 10 MG bid      Time spent . 45 min;> 50% of time with patient was spent reviewing records, labs, tests and studies, counseling and developing plan of care  Webb Silversmith D. [name], MD

## 2016-11-18 ENCOUNTER — Encounter: Payer: Self-pay | Admitting: Internal Medicine

## 2016-11-18 DIAGNOSIS — Z1612 Extended spectrum beta lactamase (ESBL) resistance: Secondary | ICD-10-CM

## 2016-11-18 DIAGNOSIS — N390 Urinary tract infection, site not specified: Secondary | ICD-10-CM

## 2016-11-18 DIAGNOSIS — B964 Proteus (mirabilis) (morganii) as the cause of diseases classified elsewhere: Secondary | ICD-10-CM | POA: Insufficient documentation

## 2016-11-18 DIAGNOSIS — I10 Essential (primary) hypertension: Secondary | ICD-10-CM | POA: Insufficient documentation

## 2016-11-18 DIAGNOSIS — D62 Acute posthemorrhagic anemia: Secondary | ICD-10-CM | POA: Insufficient documentation

## 2016-11-18 DIAGNOSIS — B9629 Other Escherichia coli [E. coli] as the cause of diseases classified elsewhere: Secondary | ICD-10-CM | POA: Insufficient documentation

## 2016-11-29 DIAGNOSIS — S72001D Fracture of unspecified part of neck of right femur, subsequent encounter for closed fracture with routine healing: Secondary | ICD-10-CM | POA: Diagnosis not present

## 2016-12-19 ENCOUNTER — Encounter: Payer: Self-pay | Admitting: Internal Medicine

## 2016-12-19 NOTE — Progress Notes (Signed)
Opened in error; Disregard.

## 2016-12-20 ENCOUNTER — Encounter: Payer: Self-pay | Admitting: Internal Medicine

## 2016-12-20 ENCOUNTER — Non-Acute Institutional Stay (SKILLED_NURSING_FACILITY): Payer: Medicare Other | Admitting: Internal Medicine

## 2016-12-20 DIAGNOSIS — I10 Essential (primary) hypertension: Secondary | ICD-10-CM

## 2016-12-20 DIAGNOSIS — I25118 Atherosclerotic heart disease of native coronary artery with other forms of angina pectoris: Secondary | ICD-10-CM

## 2016-12-20 DIAGNOSIS — I5022 Chronic systolic (congestive) heart failure: Secondary | ICD-10-CM

## 2016-12-20 NOTE — Progress Notes (Signed)
Location:  [facility] Room Number: 212D Place of Service:  [phone])  Noah Delaine. [name], MD  Patient Care Team: Harlan Stains, MD as PCP - General [facility] Medicine)  Extended Emergency Contact Information Primary Emergency Contact: [name],[name] Address: [address]          [place], [place] [address] [name] of [place] Phone: [phone] Work Phone: [phone] Mobile Phone: [phone] Relation: Daughter Secondary Emergency Contact: [name],[name]  [place] Phone: [phone] Relation: Niece    Allergies: Lisinopril; Spironolactone; Vancomycin; Donepezil; Eggs or egg-derived products; Erythromycin; Macrolides and ketolides; Penicillins; Quinapril hcl; Angiotensin receptor blockers; Lipitor [atorvastatin]; Pentazocine lactate; Propoxyphene hcl; and Sulfonamide derivatives  Chief Complaint  Patient presents with  . Medical Management of Chronic Issues    Routine Visit    HPI: Patient is 76 y.o. female who is being seen for routine issue of HTN, CAD and CHF.  Past Medical History:  Diagnosis Date  . Anxiety   . CAD (coronary artery disease)    a. s/p inferior STEMI 12/29/10 with rotablator atherectomy RCA 12/31/10 and DES x 2 RCA;  b. Lexiscan Myoview (02/2015): mild reversible apical anterior perfusion defect. C. cath 02/2015 50% LAD lesion with patent stent, Tx Rx    . COPD (chronic obstructive pulmonary disease) ([place])   . Dementia   . Diabetes mellitus    type 2  . Diverticulosis   . GERD (gastroesophageal reflux disease)   . Glaucoma   . HTN (hypertension)   . Hyperlipidemia   . Ischemic cardiomyopathy    EF 45%cath 2012 EF55% 5/12; 35% 11/14  . Memory deficit   . Nephrolithiasis   . Osteoarthritis   . Overweight(278.02)   . Respiratory arrest//ACE Inhibitor presumed cause   . Sleep apnea   . Small bowel obstruction    from a sigmoid stricture    Past Surgical History:  Procedure Laterality Date   . bilateral tubal ligation    . CARDIAC CATHETERIZATION N/A 03/18/2015   Procedure: LEFT HEART CATH AND CORONARY ANGIOGRAPHY;  Surgeon: Lorretta Harp, MD;  Location: [facility] CATH LAB;  Service: Cardiovascular;  Laterality: N/A;  . CARDIAC SURGERY    . HIP ARTHROPLASTY Right 11/10/2016   Procedure: ARTHROPLASTY BIPOLAR HIP (HEMIARTHROPLASTY);  Surgeon: Marchia Bond, MD;  Location: [facility];  Service: Orthopedics;  Laterality: Right;  . 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 LITHOTRIPSY      Allergies as of 12/20/2016      Reactions   Lisinopril Other (See Comments)   Reaction:  Respiratory arrest    Spironolactone Anaphylaxis   Vancomycin Anaphylaxis, Rash   Donepezil Diarrhea   Eggs Or Egg-derived Products Diarrhea   Erythromycin Other (See Comments)   Reaction:  Makes pt feel weird    Macrolides And Ketolides Other (See Comments)   Reaction:  Unknown    Penicillins Itching, Other (See Comments)   Has patient had a PCN reaction causing immediate rash, facial/tongue/throat swelling, SOB or lightheadedness with hypotension: No Has patient had a PCN reaction causing severe rash involving mucus membranes or skin necrosis: No Has patient had a PCN reaction that required hospitalization No Has patient had a PCN reaction occurring within the last 10 years: No If all of the above answers are "NO", then may proceed with Cephalosporin use.   Quinapril Hcl Itching   Angiotensin Receptor Blockers Other (See Comments)   Reaction:  Unknown    Lipitor [atorvastatin]  Palpitations   Pentazocine Lactate Other (See Comments)   Reaction:  Unknown    Propoxyphene Hcl Other (See Comments)   Reaction unknown   Sulfonamide Derivatives Other (See Comments)   Reaction:  Unknown       Medication List       Accurate as of 12/20/16 11:59 PM. Always use your most recent med list.          acetaminophen 325 MG tablet Commonly known as:  TYLENOL Take 2 tablets (650 mg  total) by mouth every 6 (six) hours as needed.   atorvastatin 40 MG tablet Commonly known as:  LIPITOR Take 40 mg by mouth daily.   clopidogrel 75 MG tablet Commonly known as:  PLAVIX Take 75 mg by mouth daily.   enoxaparin 40 MG/0.4ML injection Commonly known as:  LOVENOX Inject 0.4 mLs (40 mg total) into the skin daily.   feeding supplement (PRO-STAT SUGAR FREE 64) Liqd Take 30 mLs by mouth 2 (two) times daily. To support wound healing   furosemide 20 MG tablet Commonly known as:  LASIX Take 20 mg by mouth daily.   HUMALOG KWIKPEN 100 UNIT/ML KiwkPen Generic drug:  insulin lispro Inject 4 Units into the skin 3 (three) times daily with meals.   HYDROcodone-acetaminophen 5-325 MG tablet Commonly known as:  NORCO/VICODIN Take 1 tablet by mouth every 6 (six) hours as needed for moderate pain.   insulin glargine 100 unit/mL Sopn Commonly known as:  LANTUS Inject 0.15 mLs (15 Units total) into the skin at bedtime.   isosorbide mononitrate 30 MG 24 hr tablet Commonly known as:  IMDUR Take 0.5 tablets (15 mg total) by mouth daily.   LORazepam 0.5 MG tablet Commonly known as:  ATIVAN Take 1 tablet (0.5 mg total) by mouth 2 (two) times daily as needed for anxiety.   memantine 10 MG tablet Commonly known as:  NAMENDA Take 10 mg by mouth 2 (two) times daily.   metFORMIN 1000 MG tablet Commonly known as:  GLUCOPHAGE Take 1,000 mg by mouth 2 (two) times daily with a meal.   metoprolol succinate 25 MG 24 hr tablet Commonly known as:  TOPROL XL Take 0.5 tablets (12.5 mg total) by mouth daily.   mirtazapine 15 MG disintegrating tablet Commonly known as:  REMERON SOL-TAB Take 1 tablet (15 mg total) by mouth at bedtime.   mometasone-formoterol 100-5 MCG/ACT Aero Commonly known as:  DULERA Inhale 2 puffs into the lungs 2 (two) times daily.   nitroGLYCERIN 0.4 MG SL tablet Commonly known as:  NITROSTAT Place 0.4 mg under the tongue every 5 (five) minutes as needed for  chest pain.   omeprazole 20 MG capsule Commonly known as:  PRILOSEC Take 20 mg by mouth daily before breakfast.   polyethylene glycol packet Commonly known as:  MIRALAX / GLYCOLAX Take 17 g by mouth daily as needed for mild constipation.   sennosides-docusate sodium 8.6-50 MG tablet Commonly known as:  SENOKOT-S Take 2 tablets by mouth daily.       No orders of the defined types were placed in this encounter.   Immunization History  Administered Date(s) Administered  . Influenza Split 08/20/2014  . Influenza, High Dose Seasonal PF 10/06/2013  . PPD Test 11/16/2016  . Pneumococcal Polysaccharide-23 09/24/2009    Social History  Substance Use Topics  . Smoking status: Current Every Day Smoker    Packs/day: 1.00    Years: 25.00    Types: Cigarettes  . Smokeless tobacco: Never Used  Comment: over a ppd for 40+ years; quit 10/2004.  unsuccessfully tried eCigs.  . Alcohol use No    Review of Systems  DATA OBTAINED: from patient, nurse GENERAL:  no fevers, fatigue, appetite changes SKIN: No itching, rash HEENT: No complaint RESPIRATORY: No cough, wheezing, SOB CARDIAC: No chest pain, palpitations, lower extremity edema  GI: No abdominal pain, No N/V/D or constipation, No heartburn or reflux  GU: No dysuria, frequency or urgency, or incontinence  MUSCULOSKELETAL: No unrelieved bone/joint pain NEUROLOGIC: No headache, dizziness  PSYCHIATRIC: No overt anxiety or sadness  Vitals:   12/20/16 0821  BP: (!) 153/74  Pulse: 67  Resp: 17  Temp: 98.2 F (36.8 C)   Body mass index is 24.5 kg/m. Physical Exam  GENERAL APPEARANCE: Alert, conversant, No acute distress  SKIN: No diaphoresis rash HEENT: Unremarkable RESPIRATORY: Breathing is even, unlabored. Lung sounds are clear   CARDIOVASCULAR: Heart RRR no murmurs, rubs or gallops. No peripheral edema  GASTROINTESTINAL: Abdomen is soft, non-tender, not distended w/ normal bowel sounds.  GENITOURINARY: Bladder non  tender, not distended  MUSCULOSKELETAL: No abnormal joints or musculature NEUROLOGIC: Cranial nerves 2-12 grossly intact. Moves all extremities PSYCHIATRIC: Mood and affect appropriate to situation, no behavioral issues  Patient Active Problem List   Diagnosis Date Noted  . UTI due to extended-spectrum beta lactamase (ESBL) producing Escherichia coli 11/18/2016  . Urinary tract infection due to Proteus 11/18/2016  . Postoperative anemia due to acute blood loss 11/18/2016  . HTN (hypertension) 11/18/2016  . Chronic diastolic CHF (congestive heart failure) ([place]) 11/12/2016  . Closed right hip fracture ([place]) 11/10/2016  . 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 Obesity (BMI 30-39.9) 10/06/2015  . Tobacco use disorder 03/17/2015  . Cardiomyopathy, ischemic 03/17/2015  . Allergy to ACE inhibitors 03/17/2015  . Ventricular tachycardia ([place]) 03/17/2015  . COPD (chronic obstructive pulmonary disease) ([place]) 12/31/2014  . Chronic cough 09/12/2013  . Memory deficit 04/23/2013  . Essential hypertension, benign 04/03/2013  . Dyslipidemia 04/03/2013  . Coronary atherosclerosis of native coronary artery 04/03/2013  . DM2 (diabetes mellitus, type 2) ([place]) 04/02/2013  . Fibromyalgia 04/20/2008    CMP     Component Value Date/Time   NA 138 11/16/2016 0522   NA 138 11/16/2016 0522   K 3.6 11/16/2016 0522   CL 103 11/16/2016 0522   CO2 31 11/16/2016 0522   GLUCOSE 220 (H) 11/16/2016 0522   BUN 11 11/16/2016 0522   BUN 11 11/16/2016 0522   CREATININE 0.77 11/16/2016 0522   CREATININE 0.8 11/16/2016 0522   CALCIUM 8.2 (L) 11/16/2016 0522   PROT 7.7 04/27/2016 1724   ALBUMIN 4.4 04/27/2016 1724   AST 22 04/27/2016 1724   ALT 9 (L) 04/27/2016 1724   ALKPHOS 69 04/27/2016 1724   BILITOT 1.2 04/27/2016 1724   GFRNONAA >60 11/16/2016 0522   GFRAA >60 11/16/2016 0522    Recent Labs  02/04/16 2258 02/05/16 0527  11/11/16 0228 11/12/16 0633 11/16/16 0522  NA 139 142   < > 137  137 137  137 138  138  K 3.7 4.0  < > 4.6  4.6 3.8  3.8 3.6  CL 102 102  < > 101 102 103  CO2 27 28  < > 24 26 31   GLUCOSE 224* 178*  < > 227* 216* 220*  BUN 20 21*  < > 17  17 12  12 11  11   CREATININE 0.97 0.92  < > 0.89  0.9 0.73  0.7  0.77  0.8  CALCIUM 8.8* 9.3  < > 8.4* 8.1* 8.2*  MG 1.5* 2.2  --   --   --   --   < > = values in this interval not displayed.  Recent Labs  02/28/16 1321 04/27/16 1724  AST 14* 22  ALT 11* 9*  ALKPHOS 60 69  BILITOT 0.6 1.2  PROT 7.0 7.7  ALBUMIN 3.8 4.4    Recent Labs  04/20/16 2200  07/03/16 1831 11/10/16 0920  11/12/16 0633 11/13/16 0508 11/16/16 0522  WBC 10.8*  < > 10.4 13.0  13.0*  < > 11.3  11.3* 12.1  12.1* 5.9  8.9  NEUTROABS 7.4  --  9.0* 10.3*  --   --   --   --   HGB 13.1  < > 13.4 13.8  13.8  < > 9.9*  9.9* 10.2*  10.2* 10.2*  HCT 39.1  < > 39.1 41.0  41  < > 30.0*  30* 31.1*  31* 31.4*  MCV 96.3  < > 97.3 98.1  < > 97.7 100.6* 100.3*  PLT 161  < > 162 198  198  < > 169  169 135*  135* 200  < > = values in this interval not displayed. No results for input(s): CHOL, LDLCALC, TRIG in the last 8760 hours.  Invalid input(s): HCL No results found for: [facility] Lab Results  Component Value Date   TSH 1.82 10/30/2012   Lab Results  Component Value Date   HGBA1C 7.4 (H) 01/02/2016   Lab Results  Component Value Date   CHOL 69 12/19/2012   HDL 31.30 (L) 12/19/2012   LDLCALC 10 12/19/2012   TRIG 138.0 12/19/2012   CHOLHDL 2 12/19/2012    Significant Diagnostic Results in last 30 days:  Ct Head Wo Contrast  Result Date: 12/27/2016 CLINICAL DATA:  [place] today with trauma to the posterior head. EXAM: CT HEAD WITHOUT CONTRAST TECHNIQUE: Contiguous axial images were obtained from the base of the skull through the vertex without intravenous contrast. COMPARISON:  11/10/2016 FINDINGS: Brain: Generalized atrophy. The brainstem and cerebellum are unremarkable. Old lacunar infarction right  thalamus. Old lacunar infarctions right basal ganglia. Chronic small-vessel ischemic changes within the hemispheric deep white matter. No sign of acute infarction, mass lesion, hemorrhage, hydrocephalus or extra-axial collection. Vascular: No abnormal vascular finding. Skull: No fracture or focal lesion. Sinuses/Orbits: Clear/normal Other: None significant IMPRESSION: No acute or traumatic finding. Atrophy and chronic small vessel ischemic changes. Electronically Signed   By: Nelson Chimes M.D.   On: 12/27/2016 12:53    Assessment and Plan  Essential hypertension, benign Controlled;cont toprol -XL 12.5 mg daily, lasix 40 mg daily; plan to cont current meds  Chronic systolic heart failure (HCC) Currently controlled; cont toprol XL 12.5 mg daily and lasix 40 mg daily; will monitor weights  Coronary atherosclerosis of native coronary artery Stable without c/o of CP; plan to cont toprol XL 12.5 mg daily, imdur 15 mg daily, plavix 75 mg daily and lipitor and prn NTG    Kelilah Hebard D. [name], MD

## 2016-12-21 ENCOUNTER — Non-Acute Institutional Stay (SKILLED_NURSING_FACILITY): Payer: Medicare Other | Admitting: Internal Medicine

## 2016-12-21 DIAGNOSIS — Z20828 Contact with and (suspected) exposure to other viral communicable diseases: Secondary | ICD-10-CM

## 2016-12-22 DIAGNOSIS — E119 Type 2 diabetes mellitus without complications: Secondary | ICD-10-CM | POA: Diagnosis not present

## 2016-12-22 DIAGNOSIS — E559 Vitamin D deficiency, unspecified: Secondary | ICD-10-CM | POA: Diagnosis not present

## 2016-12-22 DIAGNOSIS — I10 Essential (primary) hypertension: Secondary | ICD-10-CM | POA: Diagnosis not present

## 2016-12-22 LAB — CBC AND DIFFERENTIAL
HCT: 35 % — AB (ref 36–46)
Hemoglobin: 11.7 g/dL — AB (ref 12.0–16.0)
PLATELETS: 181 10*3/uL (ref 150–399)
WBC: 9.5 10*3/mL

## 2016-12-22 LAB — BASIC METABOLIC PANEL
BUN: 13 mg/dL (ref 4–21)
CREATININE: 0.6 mg/dL (ref 0.5–1.1)
GLUCOSE: 159 mg/dL
Potassium: 4 mmol/L (ref 3.4–5.3)
SODIUM: 140 mmol/L (ref 137–147)

## 2016-12-22 LAB — TSH: TSH: 3.76 u[IU]/mL (ref <=5.90)

## 2016-12-22 LAB — VITAMIN D 25 HYDROXY (VIT D DEFICIENCY, FRACTURES): Vit D, 25-Hydroxy: 7.82

## 2016-12-22 LAB — LIPID PANEL
CHOLESTEROL: 117 mg/dL (ref 0–200)
HDL: 29 mg/dL — AB (ref 35–70)
LDL Cholesterol: 59 mg/dL
TRIGLYCERIDES: 183 mg/dL — AB (ref 40–160)

## 2016-12-22 LAB — HEPATIC FUNCTION PANEL
ALK PHOS: 79 U/L (ref 25–125)
ALT: 8 U/L (ref 7–35)
AST: 14 U/L (ref 13–35)
Bilirubin, Total: 0.3 mg/dL

## 2016-12-22 LAB — HEMOGLOBIN A1C: Hemoglobin A1C: 6.6

## 2016-12-27 ENCOUNTER — Ambulatory Visit
Admission: RE | Admit: 2016-12-27 | Discharge: 2016-12-27 | Disposition: A | Discharge status: Home / Self Care | Payer: Medicare Other | Source: Ambulatory Visit | Attending: Orthopedic Surgery | Admitting: Orthopedic Surgery

## 2016-12-27 ENCOUNTER — Other Ambulatory Visit: Payer: Self-pay | Admitting: Orthopedic Surgery

## 2016-12-27 DIAGNOSIS — Z9181 History of falling: Secondary | ICD-10-CM

## 2016-12-27 DIAGNOSIS — S72001D Fracture of unspecified part of neck of right femur, subsequent encounter for closed fracture with routine healing: Secondary | ICD-10-CM | POA: Diagnosis not present

## 2016-12-27 DIAGNOSIS — S0990XA Unspecified injury of head, initial encounter: Secondary | ICD-10-CM | POA: Diagnosis not present

## 2017-01-03 ENCOUNTER — Encounter: Payer: Self-pay | Admitting: Internal Medicine

## 2017-01-03 NOTE — Assessment & Plan Note (Signed)
Currently controlled; cont toprol XL 12.5 mg daily and lasix 40 mg daily; will monitor weights

## 2017-01-03 NOTE — Assessment & Plan Note (Addendum)
Stable without c/o of CP; plan to cont toprol XL 12.5 mg daily, imdur 15 mg daily, plavix 75 mg daily and lipitor and prn NTG

## 2017-01-03 NOTE — Assessment & Plan Note (Signed)
Controlled;cont toprol -XL 12.5 mg daily, lasix 40 mg daily; plan to cont current meds

## 2017-01-13 DIAGNOSIS — N390 Urinary tract infection, site not specified: Secondary | ICD-10-CM | POA: Diagnosis not present

## 2017-01-13 DIAGNOSIS — Z79899 Other long term (current) drug therapy: Secondary | ICD-10-CM | POA: Diagnosis not present

## 2017-01-15 ENCOUNTER — Non-Acute Institutional Stay (SKILLED_NURSING_FACILITY): Payer: Medicare Other | Admitting: Internal Medicine

## 2017-01-15 ENCOUNTER — Encounter: Payer: Self-pay | Admitting: Internal Medicine

## 2017-01-15 DIAGNOSIS — B9629 Other Escherichia coli [E. coli] as the cause of diseases classified elsewhere: Secondary | ICD-10-CM

## 2017-01-15 DIAGNOSIS — A498 Other bacterial infections of unspecified site: Secondary | ICD-10-CM

## 2017-01-15 DIAGNOSIS — N390 Urinary tract infection, site not specified: Secondary | ICD-10-CM

## 2017-01-15 DIAGNOSIS — Z1612 Extended spectrum beta lactamase (ESBL) resistance: Secondary | ICD-10-CM | POA: Diagnosis not present

## 2017-01-15 NOTE — Progress Notes (Signed)
Location:  [facility] Room Number: 212D Place of Service:  SNF [phone])  [name]. [name], MD  Patient Care Team: Harlan Stains, MD as PCP - General [facility] Medicine)  Extended Emergency Contact Information Primary Emergency Contact: [name],[name] Address: [address]          [place], [place] [address] [name] of [place] Phone: [phone] Work Phone: [phone] Mobile Phone: [phone] Relation: Daughter Secondary Emergency Contact: [name],[name]  [place] Phone: [phone] Relation: Niece    Allergies: Lisinopril; Spironolactone; Vancomycin; Donepezil; Eggs or egg-derived products; Erythromycin; Macrolides and ketolides; Penicillins; Quinapril hcl; Angiotensin receptor blockers; Lipitor [atorvastatin]; Pentazocine lactate; Propoxyphene hcl; and Sulfonamide derivatives  Chief Complaint  Patient presents with  . Acute Visit    Acute    HPI: Patient is 76 y.o. female who is being seen for a UTI. Pt had a urine sent several days ago for c/o dysuria and inc confusion. No fever reported. Pt has not gotten sicker. Today MIC came back with >100,000 ESBL E Coli.  Past Medical History:  Diagnosis Date  . Anxiety   . CAD (coronary artery disease)    a. s/p inferior STEMI 12/29/10 with rotablator atherectomy RCA 12/31/10 and DES x 2 RCA;  b. Lexiscan Myoview (02/2015): mild reversible apical anterior perfusion defect. C. cath 02/2015 50% LAD lesion with patent stent, Tx Rx    . COPD (chronic obstructive pulmonary disease) ([place])   . Dementia   . Diabetes mellitus    type 2  . Diverticulosis   . GERD (gastroesophageal reflux disease)   . Glaucoma   . HTN (hypertension)   . Hyperlipidemia   . Ischemic cardiomyopathy    EF 45%cath 2012 EF55% 5/12; 35% 11/14  . Memory deficit   . Nephrolithiasis   . Osteoarthritis   . Overweight(278.02)   . Respiratory arrest//ACE Inhibitor presumed cause   . Sleep apnea   . Small  bowel obstruction    from a sigmoid stricture    Past Surgical History:  Procedure Laterality Date  . bilateral tubal ligation    . CARDIAC CATHETERIZATION N/A 03/18/2015   Procedure: LEFT HEART CATH AND CORONARY ANGIOGRAPHY;  Surgeon: Lorretta Harp, MD;  Location: [facility] CATH LAB;  Service: Cardiovascular;  Laterality: N/A;  . CARDIAC SURGERY    . HIP ARTHROPLASTY Right 11/10/2016   Procedure: ARTHROPLASTY BIPOLAR HIP (HEMIARTHROPLASTY);  Surgeon: Marchia Bond, MD;  Location: [facility];  Service: Orthopedics;  Laterality: Right;  . 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 LITHOTRIPSY      Allergies as of 01/15/2017      Reactions   Lisinopril Other (See Comments)   Reaction:  Respiratory arrest    Spironolactone Anaphylaxis   Vancomycin Anaphylaxis, Rash   Donepezil Diarrhea   Eggs Or Egg-derived Products Diarrhea   Erythromycin Other (See Comments)   Reaction:  Makes pt feel weird    Macrolides And Ketolides Other (See Comments)   Reaction:  Unknown    Penicillins Itching, Other (See Comments)   Has patient had a PCN reaction causing immediate rash, facial/tongue/throat swelling, SOB or lightheadedness with hypotension: No Has patient had a PCN reaction causing severe rash involving mucus membranes or skin necrosis: No Has patient had a PCN reaction that required hospitalization No Has patient had a PCN reaction occurring within the last 10 years: No If all of the above answers are "NO", then may proceed with Cephalosporin use.  Quinapril Hcl Itching   Angiotensin Receptor Blockers Other (See Comments)   Reaction:  Unknown    Lipitor [atorvastatin] Palpitations   Pentazocine Lactate Other (See Comments)   Reaction:  Unknown    Propoxyphene Hcl Other (See Comments)   Reaction unknown   Sulfonamide Derivatives Other (See Comments)   Reaction:  Unknown       Medication List       Accurate as of 01/15/17 11:59 PM. Always use your most recent med  list.          acetaminophen 325 MG tablet Commonly known as:  TYLENOL Take 2 tablets (650 mg total) by mouth every 6 (six) hours as needed.   atorvastatin 40 MG tablet Commonly known as:  LIPITOR Take 40 mg by mouth daily.   clopidogrel 75 MG tablet Commonly known as:  PLAVIX Take 75 mg by mouth daily.   enoxaparin 40 MG/0.4ML injection Commonly known as:  LOVENOX Inject 0.4 mLs (40 mg total) into the skin daily.   feeding supplement (PRO-STAT SUGAR FREE 64) Liqd Take 30 mLs by mouth 2 (two) times daily. To support wound healing   furosemide 20 MG tablet Commonly known as:  LASIX Take 20 mg by mouth daily.   HUMALOG KWIKPEN 100 UNIT/ML KiwkPen Generic drug:  insulin lispro Inject 4 Units into the skin 3 (three) times daily with meals.   HYDROcodone-acetaminophen 5-325 MG tablet Commonly known as:  NORCO/VICODIN Take 1 tablet by mouth every 6 (six) hours as needed for moderate pain.   insulin glargine 100 unit/mL Sopn Commonly known as:  LANTUS Inject 0.15 mLs (15 Units total) into the skin at bedtime.   isosorbide mononitrate 30 MG 24 hr tablet Commonly known as:  IMDUR Take 0.5 tablets (15 mg total) by mouth daily.   LORazepam 0.5 MG tablet Commonly known as:  ATIVAN Take 1 tablet (0.5 mg total) by mouth 2 (two) times daily as needed for anxiety.   memantine 10 MG tablet Commonly known as:  NAMENDA Take 10 mg by mouth 2 (two) times daily.   metFORMIN 1000 MG tablet Commonly known as:  GLUCOPHAGE Take 1,000 mg by mouth 2 (two) times daily with a meal.   metoprolol succinate 25 MG 24 hr tablet Commonly known as:  TOPROL XL Take 0.5 tablets (12.5 mg total) by mouth daily.   mirtazapine 15 MG disintegrating tablet Commonly known as:  REMERON SOL-TAB Take 1 tablet (15 mg total) by mouth at bedtime.   mometasone-formoterol 100-5 MCG/ACT Aero Commonly known as:  DULERA Inhale 2 puffs into the lungs 2 (two) times daily.   nitroGLYCERIN 0.4 MG SL  tablet Commonly known as:  NITROSTAT Place 0.4 mg under the tongue every 5 (five) minutes as needed for chest pain.   omeprazole 20 MG capsule Commonly known as:  PRILOSEC Take 20 mg by mouth daily before breakfast.   polyethylene glycol packet Commonly known as:  MIRALAX / GLYCOLAX Take 17 g by mouth daily as needed for mild constipation.   sennosides-docusate sodium 8.6-50 MG tablet Commonly known as:  SENOKOT-S Take 2 tablets by mouth daily.   Vitamin D (Ergocalciferol) 50000 units Caps capsule Commonly known as:  DRISDOL Take 50,000 Units by mouth every 7 (seven) days.       No orders of the defined types were placed in this encounter.   Immunization History  Administered Date(s) Administered  . Influenza Split 08/20/2014  . Influenza, High Dose Seasonal PF 10/06/2013  . PPD Test 11/16/2016  .  Pneumococcal Polysaccharide-23 09/24/2009    Social History  Substance Use Topics  . Smoking status: Current Every Day Smoker    Packs/day: 1.00    Years: 25.00    Types: Cigarettes  . Smokeless tobacco: Never Used     Comment: over a ppd for 40+ years; quit 10/2004.  unsuccessfully tried eCigs.  . Alcohol use No    Review of Systems- pt can not fully participate; when asked if it hurt to pee she said yes; nursing as per HPI   Vitals:   01/15/17 1421  BP: 135/74  Pulse: 81  Resp: 16  Temp: 98 F (36.7 C)   Body mass index is 23.8 kg/m. Physical Exam  GENERAL APPEARANCE: Alert,min  conversant, No acute distress  SKIN: No diaphoresis rash HEENT: Unremarkable RESPIRATORY: Breathing is even, unlabored. Lung sounds are clear   CARDIOVASCULAR: Heart RRR no murmurs, rubs or gallops. No peripheral edema  GASTROINTESTINAL: Abdomen is soft, non-tender, not distended w/ normal bowel sounds.  GENITOURINARY: Bladder non tender, not distended  MUSCULOSKELETAL: No abnormal joints or musculature NEUROLOGIC: Cranial nerves 2-12 grossly intact. Moves all  extremities PSYCHIATRIC: Mood and affect appropriate to situation with dementia, no behavioral issues  Patient Active Problem List   Diagnosis Date Noted  . UTI due to extended-spectrum beta lactamase (ESBL) producing Escherichia coli 11/18/2016  . Urinary tract infection due to Proteus 11/18/2016  . Postoperative anemia due to acute blood loss 11/18/2016  . HTN (hypertension) 11/18/2016  . Chronic diastolic CHF (congestive heart failure) ([place]) 11/12/2016  . Closed right hip fracture ([place]) 11/10/2016  . 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 Obesity (BMI 30-39.9) 10/06/2015  . Tobacco use disorder 03/17/2015  . Cardiomyopathy, ischemic 03/17/2015  . Allergy to ACE inhibitors 03/17/2015  . Ventricular tachycardia ([place]) 03/17/2015  . COPD (chronic obstructive pulmonary disease) ([place]) 12/31/2014  . Chronic cough 09/12/2013  . Memory deficit 04/23/2013  . Essential hypertension, benign 04/03/2013  . Dyslipidemia 04/03/2013  . Coronary atherosclerosis of native coronary artery 04/03/2013  . DM2 (diabetes mellitus, type 2) ([place]) 04/02/2013  . Fibromyalgia 04/20/2008    CMP     Component Value Date/Time   NA 140 12/22/2016   K 4.0 12/22/2016   CL 103 11/16/2016 0522   CO2 31 11/16/2016 0522   GLUCOSE 220 (H) 11/16/2016 0522   BUN 13 12/22/2016   CREATININE 0.6 12/22/2016   CREATININE 0.77 11/16/2016 0522   CALCIUM 8.2 (L) 11/16/2016 0522   PROT 7.7 04/27/2016 1724   ALBUMIN 4.4 04/27/2016 1724   AST 14 12/22/2016   ALT 8 12/22/2016   ALKPHOS 79 12/22/2016   BILITOT 1.2 04/27/2016 1724   GFRNONAA >60 11/16/2016 0522   GFRAA >60 11/16/2016 0522    Recent Labs  02/04/16 2258 02/05/16 0527  11/11/16 0228 11/12/16 0633 11/16/16 0522 12/22/16  NA 139 142  < > 137  137 137  137 138  138 140  K 3.7 4.0  < > 4.6  4.6 3.8  3.8 3.6 4.0  CL 102 102  < > 101 102 103  --   CO2 27 28  < > 24 26 31   --   GLUCOSE 224* 178*  < > 227* 216* 220*  --   BUN  20 21*  < > 17  17 12  12 11  11 13   CREATININE 0.97 0.92  < > 0.89  0.9 0.73  0.7 0.77  0.8 0.6  CALCIUM 8.8* 9.3  < > 8.4*  8.1* 8.2*  --   MG 1.5* 2.2  --   --   --   --   --   < > = values in this interval not displayed.  Recent Labs  02/28/16 1321 04/27/16 1724 12/22/16  AST 14* 22 14  ALT 11* 9* 8  ALKPHOS 60 69 79  BILITOT 0.6 1.2  --   PROT 7.0 7.7  --   ALBUMIN 3.8 4.4  --     Recent Labs  04/20/16 2200  07/03/16 1831 11/10/16 0920  11/12/16 0633 11/13/16 0508 11/16/16 0522 12/22/16  WBC 10.8*  < > 10.4 13.0  13.0*  < > 11.3  11.3* 12.1  12.1* 5.9  8.9 9.5  NEUTROABS 7.4  --  9.0* 10.3*  --   --   --   --   --   HGB 13.1  < > 13.4 13.8  13.8  < > 9.9*  9.9* 10.2*  10.2* 10.2* 11.7*  HCT 39.1  < > 39.1 41.0  41  < > 30.0*  30* 31.1*  31* 31.4* 35*  MCV 96.3  < > 97.3 98.1  < > 97.7 100.6* 100.3*  --   PLT 161  < > 162 198  198  < > 169  169 135*  135* 200 181  < > = values in this interval not displayed. No results for input(s): CHOL, LDLCALC, TRIG in the last 8760 hours.  Invalid input(s): HCL No results found for: [facility] Lab Results  Component Value Date   TSH 3.76 12/22/2016   Lab Results  Component Value Date   HGBA1C 6.6 12/22/2016   Lab Results  Component Value Date   CHOL 69 12/19/2012   HDL 31.30 (L) 12/19/2012   LDLCALC 10 12/19/2012   TRIG 138.0 12/19/2012   CHOLHDL 2 12/19/2012    Significant Diagnostic Results in last 30 days:  Ct Head Wo Contrast  Result Date: 12/27/2016 CLINICAL DATA:  [place] today with trauma to the posterior head. EXAM: CT HEAD WITHOUT CONTRAST TECHNIQUE: Contiguous axial images were obtained from the base of the skull through the vertex without intravenous contrast. COMPARISON:  11/10/2016 FINDINGS: Brain: Generalized atrophy. The brainstem and cerebellum are unremarkable. Old lacunar infarction right thalamus. Old lacunar infarctions right basal ganglia. Chronic small-vessel ischemic changes within  the hemispheric deep white matter. No sign of acute infarction, mass lesion, hemorrhage, hydrocephalus or extra-axial collection. Vascular: No abnormal vascular finding. Skull: No fracture or focal lesion. Sinuses/Orbits: Clear/normal Other: None significant IMPRESSION: No acute or traumatic finding. Atrophy and chronic small vessel ischemic changes. Electronically Signed   By: Nelson Chimes M.D.   On: 12/27/2016 12:53    Assessment and Plan  ESBL E Coli UTI - pt's calculated CrCl is 89; therefore will use ertapemum 1 gm IM for 7 days; pt will have contact precautions and her roommate uses her own bedside commode. However, I did speek with Dr Graylon Good, ID and she rec fosfomycin so I have d/c ertapeman and will give fosfomycing 3 gm po once.      Time spent > 25 min;> 50% of time with patient was spent reviewing records, labs, tests and studies, counseling and developing plan of care  [name]. [name], MD

## 2017-01-16 ENCOUNTER — Non-Acute Institutional Stay (SKILLED_NURSING_FACILITY): Payer: Medicare Other | Admitting: Internal Medicine

## 2017-01-16 ENCOUNTER — Encounter: Payer: Self-pay | Admitting: Internal Medicine

## 2017-01-16 DIAGNOSIS — J449 Chronic obstructive pulmonary disease, unspecified: Secondary | ICD-10-CM | POA: Diagnosis not present

## 2017-01-16 DIAGNOSIS — E559 Vitamin D deficiency, unspecified: Secondary | ICD-10-CM

## 2017-01-16 DIAGNOSIS — E119 Type 2 diabetes mellitus without complications: Secondary | ICD-10-CM

## 2017-01-16 NOTE — Progress Notes (Signed)
Location:  [facility] Room Number: 212D Place of Service:  SNF [phone])  [name]. Kathleen Coil, MD  Patient Care Team: Harlan Stains, MD as PCP - General [facility] Medicine)  Extended Emergency Contact Information Primary Emergency Contact: [name],[name] Address: [address]          [place], [place] [address] [name] of [place] Phone: [phone] Work Phone: [phone] Mobile Phone: [phone] Relation: Daughter Secondary Emergency Contact: [name],[name]  [place] Phone: [phone] Relation: Niece    Allergies: Lisinopril; Spironolactone; Vancomycin; Donepezil; Eggs or egg-derived products; Erythromycin; Macrolides and ketolides; Penicillins; Quinapril hcl; Angiotensin receptor blockers; Lipitor [atorvastatin]; Pentazocine lactate; Propoxyphene hcl; and Sulfonamide derivatives  Chief Complaint  Patient presents with  . Medical Management of Chronic Issues    Routine Visit    HPI: Patient is 76 y.o. female who is being seen for routine issues of COPD,DM2 and Vit D def.  Past Medical History:  Diagnosis Date  . Anxiety   . CAD (coronary artery disease)    a. s/p inferior STEMI 12/29/10 with rotablator atherectomy RCA 12/31/10 and DES x 2 RCA;  b. Lexiscan Myoview (02/2015): mild reversible apical anterior perfusion defect. C. cath 02/2015 50% LAD lesion with patent stent, Tx Rx    . COPD (chronic obstructive pulmonary disease) ([place])   . Dementia   . Diabetes mellitus    type 2  . Diverticulosis   . GERD (gastroesophageal reflux disease)   . Glaucoma   . HTN (hypertension)   . Hyperlipidemia   . Ischemic cardiomyopathy    EF 45%cath 2012 EF55% 5/12; 35% 11/14  . Memory deficit   . Nephrolithiasis   . Osteoarthritis   . Overweight(278.02)   . Respiratory arrest//ACE Inhibitor presumed cause   . Sleep apnea   . Small bowel obstruction    from a sigmoid stricture    Past Surgical History:  Procedure  Laterality Date  . bilateral tubal ligation    . CARDIAC CATHETERIZATION N/A 03/18/2015   Procedure: LEFT HEART CATH AND CORONARY ANGIOGRAPHY;  Surgeon: Lorretta Harp, MD;  Location: [facility] CATH LAB;  Service: Cardiovascular;  Laterality: N/A;  . CARDIAC SURGERY    . HIP ARTHROPLASTY Right 11/10/2016   Procedure: ARTHROPLASTY BIPOLAR HIP (HEMIARTHROPLASTY);  Surgeon: Marchia Bond, MD;  Location: [facility];  Service: Orthopedics;  Laterality: Right;  . 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 LITHOTRIPSY      Allergies as of 01/16/2017      Reactions   Lisinopril Other (See Comments)   Reaction:  Respiratory arrest    Spironolactone Anaphylaxis   Vancomycin Anaphylaxis, Rash   Donepezil Diarrhea   Eggs Or Egg-derived Products Diarrhea   Erythromycin Other (See Comments)   Reaction:  Makes pt feel weird    Macrolides And Ketolides Other (See Comments)   Reaction:  Unknown    Penicillins Itching, Other (See Comments)   Has patient had a PCN reaction causing immediate rash, facial/tongue/throat swelling, SOB or lightheadedness with hypotension: No Has patient had a PCN reaction causing severe rash involving mucus membranes or skin necrosis: No Has patient had a PCN reaction that required hospitalization No Has patient had a PCN reaction occurring within the last 10 years: No If all of the above answers are "NO", then may proceed with Cephalosporin use.   Quinapril Hcl Itching   Angiotensin Receptor Blockers Other (See Comments)   Reaction:  Unknown    Lipitor [  atorvastatin] Palpitations   Pentazocine Lactate Other (See Comments)   Reaction:  Unknown    Propoxyphene Hcl Other (See Comments)   Reaction unknown   Sulfonamide Derivatives Other (See Comments)   Reaction:  Unknown       Medication List       Accurate as of 01/16/17 11:59 PM. Always use your most recent med list.          acetaminophen 325 MG tablet Commonly known as:  TYLENOL Take 2  tablets (650 mg total) by mouth every 6 (six) hours as needed.   albuterol 108 (90 Base) MCG/ACT inhaler Commonly known as:  PROVENTIL HFA;VENTOLIN HFA Inhale 2 puffs into the lungs every 6 (six) hours as needed for wheezing or shortness of breath.   aspirin EC 81 MG tablet Take 81 mg by mouth daily.   atorvastatin 40 MG tablet Commonly known as:  LIPITOR Take 40 mg by mouth daily.   clopidogrel 75 MG tablet Commonly known as:  PLAVIX Take 75 mg by mouth daily.   feeding supplement (GLUCERNA SHAKE) Liqd Take 237 mLs by mouth daily.   feeding supplement (PRO-STAT SUGAR FREE 64) Liqd Take 30 mLs by mouth 2 (two) times daily. To support wound healing   furosemide 20 MG tablet Commonly known as:  LASIX Take 20 mg by mouth daily.   HUMALOG KWIKPEN 100 UNIT/ML KiwkPen Generic drug:  insulin lispro Inject 4 Units into the skin 3 (three) times daily with meals.   HYDROcodone-acetaminophen 5-325 MG tablet Commonly known as:  NORCO/VICODIN Take 1 tablet by mouth every 6 (six) hours as needed for moderate pain.   insulin glargine 100 unit/mL Sopn Commonly known as:  LANTUS Inject 0.15 mLs (15 Units total) into the skin at bedtime.   isosorbide mononitrate 30 MG 24 hr tablet Commonly known as:  IMDUR Take 0.5 tablets (15 mg total) by mouth daily.   memantine 10 MG tablet Commonly known as:  NAMENDA Take 10 mg by mouth 2 (two) times daily.   metFORMIN 1000 MG tablet Commonly known as:  GLUCOPHAGE Take 1,000 mg by mouth 2 (two) times daily with a meal.   metoprolol succinate 25 MG 24 hr tablet Commonly known as:  TOPROL XL Take 0.5 tablets (12.5 mg total) by mouth daily.   mirtazapine 15 MG disintegrating tablet Commonly known as:  REMERON SOL-TAB Take 1 tablet (15 mg total) by mouth at bedtime.   mometasone-formoterol 100-5 MCG/ACT Aero Commonly known as:  DULERA Inhale 2 puffs into the lungs 2 (two) times daily.   nitroGLYCERIN 0.4 MG SL tablet Commonly known as:   NITROSTAT Place 0.4 mg under the tongue every 5 (five) minutes as needed for chest pain.   pantoprazole 40 MG tablet Commonly known as:  PROTONIX Take 40 mg by mouth daily.   polyethylene glycol packet Commonly known as:  MIRALAX / GLYCOLAX Take 17 g by mouth daily as needed for mild constipation.   sennosides-docusate sodium 8.6-50 MG tablet Commonly known as:  SENOKOT-S Take 2 tablets by mouth daily.   Vitamin D (Ergocalciferol) 50000 units Caps capsule Commonly known as:  DRISDOL Take 50,000 Units by mouth every 7 (seven) days.       Meds ordered this encounter  Medications  . aspirin EC 81 MG tablet    Sig: Take 81 mg by mouth daily.  . pantoprazole (PROTONIX) 40 MG tablet    Sig: Take 40 mg by mouth daily.  . feeding supplement, GLUCERNA SHAKE, ([place]) LIQD  Sig: Take 237 mLs by mouth daily.  Marland Kitchen albuterol (PROVENTIL HFA;VENTOLIN HFA) 108 (90 Base) MCG/ACT inhaler    Sig: Inhale 2 puffs into the lungs every 6 (six) hours as needed for wheezing or shortness of breath.    Immunization History  Administered Date(s) Administered  . Influenza Split 08/20/2014  . Influenza, High Dose Seasonal PF 10/06/2013  . PPD Test 11/16/2016  . Pneumococcal Polysaccharide-23 09/24/2009    Social History  Substance Use Topics  . Smoking status: Current Every Day Smoker    Packs/day: 1.00    Years: 25.00    Types: Cigarettes  . Smokeless tobacco: Never Used     Comment: over a ppd for 40+ years; quit 10/2004.  unsuccessfully tried eCigs.  . Alcohol use No    Review of Systems  DATA OBTAINED: from patient,nurse GENERAL:  no fevers, fatigue, appetite changes SKIN: No itching, rash HEENT: No complaint RESPIRATORY: No cough, wheezing, SOB CARDIAC: No chest pain, palpitations, lower extremity edema  GI: No abdominal pain, No N/V/D or constipation, No heartburn or reflux  GU: No dysuria, frequency or urgency, or incontinence  MUSCULOSKELETAL: No unrelieved bone/joint  pain NEUROLOGIC: No headache, dizziness  PSYCHIATRIC: No overt anxiety or sadness  Vitals:   01/16/17 1342  BP: 135/74  Pulse: 81  Resp: 16  Temp: 98 F (36.7 C)   Body mass index is 23.8 kg/m. Physical Exam  GENERAL APPEARANCE: Alert, conversant, No acute distress  SKIN: No diaphoresis rash HEENT: Unremarkable RESPIRATORY: Breathing is even, unlabored. Lung sounds are clear   CARDIOVASCULAR: Heart RRR no murmurs, rubs or gallops. No peripheral edema  GASTROINTESTINAL: Abdomen is soft, non-tender, not distended w/ normal bowel sounds.  GENITOURINARY: Bladder non tender, not distended  MUSCULOSKELETAL: No abnormal joints or musculature NEUROLOGIC: Cranial nerves 2-12 grossly intact. Moves all extremities PSYCHIATRIC: Mood and affect appropriate to situation, no behavioral issues  Patient Active Problem List   Diagnosis Date Noted  . Vitamin D deficiency 01/28/2017  . UTI due to extended-spectrum beta lactamase (ESBL) producing Escherichia coli 11/18/2016  . Urinary tract infection due to Proteus 11/18/2016  . Postoperative anemia due to acute blood loss 11/18/2016  . HTN (hypertension) 11/18/2016  . Chronic diastolic CHF (congestive heart failure) ([place]) 11/12/2016  . Closed right hip fracture ([place]) 11/10/2016  . 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 Obesity (BMI 30-39.9) 10/06/2015  . Tobacco use disorder 03/17/2015  . Cardiomyopathy, ischemic 03/17/2015  . Allergy to ACE inhibitors 03/17/2015  . Ventricular tachycardia ([place]) 03/17/2015  . COPD (chronic obstructive pulmonary disease) ([place]) 12/31/2014  . Chronic cough 09/12/2013  . Memory deficit 04/23/2013  . Essential hypertension, benign 04/03/2013  . Dyslipidemia 04/03/2013  . Coronary atherosclerosis of native coronary artery 04/03/2013  . DM2 (diabetes mellitus, type 2) ([place]) 04/02/2013  . Fibromyalgia 04/20/2008    CMP     Component Value Date/Time   NA 140 12/22/2016   K 4.0  12/22/2016   CL 103 11/16/2016 0522   CO2 31 11/16/2016 0522   GLUCOSE 220 (H) 11/16/2016 0522   BUN 13 12/22/2016   CREATININE 0.6 12/22/2016   CREATININE 0.77 11/16/2016 0522   CALCIUM 8.2 (L) 11/16/2016 0522   PROT 7.7 04/27/2016 1724   ALBUMIN 4.4 04/27/2016 1724   AST 14 12/22/2016   ALT 8 12/22/2016   ALKPHOS 79 12/22/2016   BILITOT 1.2 04/27/2016 1724   GFRNONAA >60 11/16/2016 0522   GFRAA >60 11/16/2016 0522    Recent Labs  02/04/16 2258  02/05/16 0527  11/11/16 0228 11/12/16 0633 11/16/16 0522 12/22/16  NA 139 142  < > 137  137 137  137 138  138 140  K 3.7 4.0  < > 4.6  4.6 3.8  3.8 3.6 4.0  CL 102 102  < > 101 102 103  --   CO2 27 28  < > 24 26 31   --   GLUCOSE 224* 178*  < > 227* 216* 220*  --   BUN 20 21*  < > 17  17 12  12 11  11 13   CREATININE 0.97 0.92  < > 0.89  0.9 0.73  0.7 0.77  0.8 0.6  CALCIUM 8.8* 9.3  < > 8.4* 8.1* 8.2*  --   MG 1.5* 2.2  --   --   --   --   --   < > = values in this interval not displayed.  Recent Labs  02/28/16 1321 04/27/16 1724 12/22/16  AST 14* 22 14  ALT 11* 9* 8  ALKPHOS 60 69 79  BILITOT 0.6 1.2  --   PROT 7.0 7.7  --   ALBUMIN 3.8 4.4  --     Recent Labs  04/20/16 2200  07/03/16 1831 11/10/16 0920  11/12/16 0633 11/13/16 0508 11/16/16 0522 12/22/16  WBC 10.8*  < > 10.4 13.0  13.0*  < > 11.3  11.3* 12.1  12.1* 5.9  8.9 9.5  NEUTROABS 7.4  --  9.0* 10.3*  --   --   --   --   --   HGB 13.1  < > 13.4 13.8  13.8  < > 9.9*  9.9* 10.2*  10.2* 10.2* 11.7*  HCT 39.1  < > 39.1 41.0  41  < > 30.0*  30* 31.1*  31* 31.4* 35*  MCV 96.3  < > 97.3 98.1  < > 97.7 100.6* 100.3*  --   PLT 161  < > 162 198  198  < > 169  169 135*  135* 200 181  < > = values in this interval not displayed. No results for input(s): CHOL, LDLCALC, TRIG in the last 8760 hours.  Invalid input(s): HCL No results found for: [facility] Lab Results  Component Value Date   TSH 3.76 12/22/2016   Lab Results  Component  Value Date   HGBA1C 6.6 12/22/2016   Lab Results  Component Value Date   CHOL 69 12/19/2012   HDL 31.30 (L) 12/19/2012   LDLCALC 10 12/19/2012   TRIG 138.0 12/19/2012   CHOLHDL 2 12/19/2012    Significant Diagnostic Results in last 30 days:  No results found.  Assessment and Plan  COPD (chronic obstructive pulmonary disease) ([place]) Stable; cont Dulera scheduled and prn albuterol  Diabetes mellitus without complication (HCC) 123456 6.6, good control; cont glucophage 1000 mg BID; cont lantus 15 u nightly with meal coverage; on statin; allergy to ACE  Vitamin D deficiency Level 7.82, severe def; pt started on 50,000 u  weekly     Webb Silversmith D. Kathleen Coil,  MD

## 2017-01-20 ENCOUNTER — Encounter: Payer: Self-pay | Admitting: Internal Medicine

## 2017-01-28 DIAGNOSIS — E559 Vitamin D deficiency, unspecified: Secondary | ICD-10-CM

## 2017-01-28 HISTORY — DX: Vitamin D deficiency, unspecified: E55.9

## 2017-01-28 NOTE — Assessment & Plan Note (Signed)
Level 7.82, severe def; pt started on 50,000 u  weekly

## 2017-01-28 NOTE — Assessment & Plan Note (Signed)
Stable; cont Dulera scheduled and prn albuterol

## 2017-01-28 NOTE — Assessment & Plan Note (Addendum)
A1c 6.6, good control; cont glucophage 1000 mg BID; cont lantus 15 u nightly with meal coverage; on statin; allergy to ACE

## 2017-01-29 DIAGNOSIS — R1312 Dysphagia, oropharyngeal phase: Secondary | ICD-10-CM | POA: Diagnosis not present

## 2017-01-29 DIAGNOSIS — M6281 Muscle weakness (generalized): Secondary | ICD-10-CM | POA: Diagnosis not present

## 2017-01-29 DIAGNOSIS — I5032 Chronic diastolic (congestive) heart failure: Secondary | ICD-10-CM | POA: Diagnosis not present

## 2017-01-31 ENCOUNTER — Encounter: Payer: Self-pay | Admitting: Internal Medicine

## 2017-01-31 NOTE — Progress Notes (Signed)
Location:  [place] Room Number: 212D Place of Service:  SNF [phone])  [name]. [name], MD  Patient Care Team: Harlan Stains, MD as PCP - General [facility] Medicine)  Extended Emergency Contact Information Primary Emergency Contact: [name],[name] Address: [address]          [place], [place] [address] [name] of [place] Phone: [phone] Work Phone: [phone] Mobile Phone: [phone] Relation: Daughter Secondary Emergency Contact: [name],[name]  [place] Phone: [phone] Relation: Niece    Allergies: Lisinopril; Spironolactone; Vancomycin; Donepezil; Eggs or egg-derived products; Erythromycin; Macrolides and ketolides; Penicillins; Quinapril hcl; Angiotensin receptor blockers; Lipitor [atorvastatin]; Pentazocine lactate; Propoxyphene hcl; and Sulfonamide derivatives  Chief Complaint  Patient presents with  . Acute Visit    HPI: Patient is 76 y.o. female who is being seen acutely because an outbreak of Influenza A per CDC guidelines was recognized on 12/20/2016. Pt has no c/o flu like symptoms;therefore pt will need to be prophylaxed with Tamiflu for a minimum of 14 days per CDC protocol.  Past Medical History:  Diagnosis Date  . Anxiety   . CAD (coronary artery disease)    a. s/p inferior STEMI 12/29/10 with rotablator atherectomy RCA 12/31/10 and DES x 2 RCA;  b. Lexiscan Myoview (02/2015): mild reversible apical anterior perfusion defect. C. cath 02/2015 50% LAD lesion with patent stent, Tx Rx    . COPD (chronic obstructive pulmonary disease) ([place])   . Dementia   . Diabetes mellitus    type 2  . Diverticulosis   . GERD (gastroesophageal reflux disease)   . Glaucoma   . HTN (hypertension)   . Hyperlipidemia   . Ischemic cardiomyopathy    EF 45%cath 2012 EF55% 5/12; 35% 11/14  . Memory deficit   . Nephrolithiasis   . Osteoarthritis   . Overweight(278.02)   . Respiratory arrest//ACE Inhibitor presumed  cause   . Sleep apnea   . Small bowel obstruction    from a sigmoid stricture    Past Surgical History:  Procedure Laterality Date  . bilateral tubal ligation    . CARDIAC CATHETERIZATION N/A 03/18/2015   Procedure: LEFT HEART CATH AND CORONARY ANGIOGRAPHY;  Surgeon: Lorretta Harp, MD;  Location: [facility] CATH LAB;  Service: Cardiovascular;  Laterality: N/A;  . CARDIAC SURGERY    . HIP ARTHROPLASTY Right 11/10/2016   Procedure: ARTHROPLASTY BIPOLAR HIP (HEMIARTHROPLASTY);  Surgeon: Marchia Bond, MD;  Location: [place];  Service: Orthopedics;  Laterality: Right;  . 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 LITHOTRIPSY      Allergies as of 12/21/2016      Reactions   Lisinopril Other (See Comments)   Reaction:  Respiratory arrest    Spironolactone Anaphylaxis   Vancomycin Anaphylaxis, Rash   Donepezil Diarrhea   Eggs Or Egg-derived Products Diarrhea   Erythromycin Other (See Comments)   Reaction:  Makes pt feel weird    Macrolides And Ketolides Other (See Comments)   Reaction:  Unknown    Penicillins Itching, Other (See Comments)   Has patient had a PCN reaction causing immediate rash, facial/tongue/throat swelling, SOB or lightheadedness with hypotension: No Has patient had a PCN reaction causing severe rash involving mucus membranes or skin necrosis: No Has patient had a PCN reaction that required hospitalization No Has patient had a PCN reaction occurring within the last 10 years: No If all of the above answers are "NO", then may proceed with Cephalosporin use.  Quinapril Hcl Itching   Angiotensin Receptor Blockers Other (See Comments)   Reaction:  Unknown    Lipitor [atorvastatin] Palpitations   Pentazocine Lactate Other (See Comments)   Reaction:  Unknown    Propoxyphene Hcl Other (See Comments)   Reaction unknown   Sulfonamide Derivatives Other (See Comments)   Reaction:  Unknown       Medication List       Accurate as of 12/21/16 11:59 PM.  Always use your most recent med list.          acetaminophen 325 MG tablet Commonly known as:  TYLENOL Take 2 tablets (650 mg total) by mouth every 6 (six) hours as needed.   albuterol 108 (90 Base) MCG/ACT inhaler Commonly known as:  PROVENTIL HFA;VENTOLIN HFA Inhale 2 puffs into the lungs every 6 (six) hours as needed for wheezing or shortness of breath.   aspirin EC 81 MG tablet Take 81 mg by mouth daily.   atorvastatin 40 MG tablet Commonly known as:  LIPITOR Take 40 mg by mouth daily.   clopidogrel 75 MG tablet Commonly known as:  PLAVIX Take 75 mg by mouth daily.   feeding supplement (GLUCERNA SHAKE) Liqd Take 237 mLs by mouth daily.   feeding supplement (PRO-STAT SUGAR FREE 64) Liqd Take 30 mLs by mouth 2 (two) times daily. To support wound healing   furosemide 20 MG tablet Commonly known as:  LASIX Take 20 mg by mouth daily.   HUMALOG KWIKPEN 100 UNIT/ML KiwkPen Generic drug:  insulin lispro Inject 4 Units into the skin 3 (three) times daily with meals.   HYDROcodone-acetaminophen 5-325 MG tablet Commonly known as:  NORCO/VICODIN Take 1 tablet by mouth every 6 (six) hours as needed for moderate pain.   insulin glargine 100 unit/mL Sopn Commonly known as:  LANTUS Inject 0.15 mLs (15 Units total) into the skin at bedtime.   isosorbide mononitrate 30 MG 24 hr tablet Commonly known as:  IMDUR Take 0.5 tablets (15 mg total) by mouth daily.   memantine 10 MG tablet Commonly known as:  NAMENDA Take 10 mg by mouth 2 (two) times daily.   metFORMIN 1000 MG tablet Commonly known as:  GLUCOPHAGE Take 1,000 mg by mouth 2 (two) times daily with a meal.   metoprolol succinate 25 MG 24 hr tablet Commonly known as:  TOPROL XL Take 0.5 tablets (12.5 mg total) by mouth daily.   mirtazapine 15 MG disintegrating tablet Commonly known as:  REMERON SOL-TAB Take 1 tablet (15 mg total) by mouth at bedtime.   mometasone-formoterol 100-5 MCG/ACT Aero Commonly known as:   DULERA Inhale 2 puffs into the lungs 2 (two) times daily.   nitroGLYCERIN 0.4 MG SL tablet Commonly known as:  NITROSTAT Place 0.4 mg under the tongue every 5 (five) minutes as needed for chest pain.   pantoprazole 40 MG tablet Commonly known as:  PROTONIX Take 40 mg by mouth daily.   polyethylene glycol packet Commonly known as:  MIRALAX / GLYCOLAX Take 17 g by mouth daily as needed for mild constipation.   sennosides-docusate sodium 8.6-50 MG tablet Commonly known as:  SENOKOT-S Take 2 tablets by mouth daily.   Vitamin D3 50000 units Caps Take 1 capsule by mouth once a week. On saturdays       No orders of the defined types were placed in this encounter.   Immunization History  Administered Date(s) Administered  . Influenza Split 08/20/2014  . Influenza, High Dose Seasonal PF 10/06/2013  . PPD  Test 11/16/2016  . Pneumococcal Polysaccharide-23 09/24/2009    Social History  Substance Use Topics  . Smoking status: Current Every Day Smoker    Packs/day: 1.00    Years: 25.00    Types: Cigarettes  . Smokeless tobacco: Never Used     Comment: over a ppd for 40+ years; quit 10/2004.  unsuccessfully tried eCigs.  . Alcohol use No    Review of Systems  DATA OBTAINED: from patient, nurse GENERAL:  no fevers SKIN: No itching, rash HEENT: no rhinorrhea, congestion, ST or ear pain RESPIRATORY: No cough, wheezing, SOB CARDIAC: No chest pain, palpitations, lower extremity edema  GI: No abdominal pain, No N/V/D or constipation, No heartburn or reflux  MUSCULOSKELETAL: No muscle aches NEUROLOGIC: No headache, dizziness   Vitals:   12/21/16 1445  BP: 135/74  Pulse: 81  Resp: 16  Temp: 98 F (36.7 C)   Body mass index is 23.5 kg/m. Physical Exam  GENERAL APPEARANCE: Alert, conversant, No acute distress  SKIN: No diaphoresis rash HEENT: Unremarkable RESPIRATORY: Breathing is even, unlabored. Lung sounds are clear   CARDIOVASCULAR: Heart RRR no murmurs, rubs or  gallops. No peripheral edema  GASTROINTESTINAL: Abdomen is soft, non-tender, not distended w/ normal bowel sounds.   NEUROLOGIC: Cranial nerves 2-12 grossly intact PSYCHIATRIC: baseline, no mental status changes  Patient Active Problem List   Diagnosis Date Noted  . Vitamin D deficiency 01/28/2017  . UTI due to extended-spectrum beta lactamase (ESBL) producing Escherichia coli 11/18/2016  . Urinary tract infection due to Proteus 11/18/2016  . Postoperative anemia due to acute blood loss 11/18/2016  . HTN (hypertension) 11/18/2016  . Chronic diastolic CHF (congestive heart failure) ([place]) 11/12/2016  . Closed right hip fracture ([place]) 11/10/2016  . 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 Obesity (BMI 30-39.9) 10/06/2015  . Tobacco use disorder 03/17/2015  . Cardiomyopathy, ischemic 03/17/2015  . Allergy to ACE inhibitors 03/17/2015  . Ventricular tachycardia ([place]) 03/17/2015  . COPD (chronic obstructive pulmonary disease) ([place]) 12/31/2014  . Chronic cough 09/12/2013  . Memory deficit 04/23/2013  . Essential hypertension, benign 04/03/2013  . Dyslipidemia 04/03/2013  . Coronary atherosclerosis of native coronary artery 04/03/2013  . DM2 (diabetes mellitus, type 2) ([place]) 04/02/2013  . Fibromyalgia 04/20/2008    CMP     Component Value Date/Time   NA 140 12/22/2016   K 4.0 12/22/2016   CL 103 11/16/2016 0522   CO2 31 11/16/2016 0522   GLUCOSE 220 (H) 11/16/2016 0522   BUN 13 12/22/2016   CREATININE 0.6 12/22/2016   CREATININE 0.77 11/16/2016 0522   CALCIUM 8.2 (L) 11/16/2016 0522   PROT 7.7 04/27/2016 1724   ALBUMIN 4.4 04/27/2016 1724   AST 14 12/22/2016   ALT 8 12/22/2016   ALKPHOS 79 12/22/2016   BILITOT 1.2 04/27/2016 1724   GFRNONAA >60 11/16/2016 0522   GFRAA >60 11/16/2016 0522    Recent Labs  02/04/16 2258 02/05/16 0527  11/11/16 0228 11/12/16 0633 11/16/16 0522 12/22/16  NA 139 142  < > 137  137 137  137 138  138 140  K 3.7 4.0  <  > 4.6  4.6 3.8  3.8 3.6 4.0  CL 102 102  < > 101 102 103  --   CO2 27 28  < > 24 26 31   --   GLUCOSE 224* 178*  < > 227* 216* 220*  --   BUN 20 21*  < > 17  17 12  12 11   11  13  CREATININE 0.97 0.92  < > 0.89  0.9 0.73  0.7 0.77  0.8 0.6  CALCIUM 8.8* 9.3  < > 8.4* 8.1* 8.2*  --   MG 1.5* 2.2  --   --   --   --   --   < > = values in this interval not displayed.  Recent Labs  02/28/16 1321 04/27/16 1724 12/22/16  AST 14* 22 14  ALT 11* 9* 8  ALKPHOS 60 69 79  BILITOT 0.6 1.2  --   PROT 7.0 7.7  --   ALBUMIN 3.8 4.4  --     Recent Labs  04/20/16 2200  07/03/16 1831 11/10/16 0920  11/12/16 0633 11/13/16 0508 11/16/16 0522 12/22/16  WBC 10.8*  < > 10.4 13.0  13.0*  < > 11.3  11.3* 12.1  12.1* 5.9  8.9 9.5  NEUTROABS 7.4  --  9.0* 10.3*  --   --   --   --   --   HGB 13.1  < > 13.4 13.8  13.8  < > 9.9*  9.9* 10.2*  10.2* 10.2* 11.7*  HCT 39.1  < > 39.1 41.0  41  < > 30.0*  30* 31.1*  31* 31.4* 35*  MCV 96.3  < > 97.3 98.1  < > 97.7 100.6* 100.3*  --   PLT 161  < > 162 198  198  < > 169  169 135*  135* 200 181  < > = values in this interval not displayed. No results for input(s): CHOL, LDLCALC, TRIG in the last 8760 hours.  Invalid input(s): HCL No results found for: [facility] Lab Results  Component Value Date   TSH 3.76 12/22/2016   Lab Results  Component Value Date   HGBA1C 6.6 12/22/2016   Lab Results  Component Value Date   CHOL 69 12/19/2012   HDL 31.30 (L) 12/19/2012   LDLCALC 10 12/19/2012   TRIG 138.0 12/19/2012   CHOLHDL 2 12/19/2012    Significant Diagnostic Results in last 30 days:  No results found.  Assessment and Plan  EXPOSURE TO FLU/ INFLUENZA OUTBREAK AT SNF-   CrCl calculated by me-   66     Dose for 14 days-  75 mg daily Pt will be monitored daily for flu like symptoms                                                                                 Webb Silversmith D. [name], MD

## 2017-02-09 ENCOUNTER — Encounter: Payer: Self-pay | Admitting: Internal Medicine

## 2017-02-09 ENCOUNTER — Non-Acute Institutional Stay (SKILLED_NURSING_FACILITY): Payer: Medicare Other | Admitting: Internal Medicine

## 2017-02-09 DIAGNOSIS — H6012 Cellulitis of left external ear: Secondary | ICD-10-CM | POA: Diagnosis not present

## 2017-02-09 DIAGNOSIS — H60392 Other infective otitis externa, left ear: Secondary | ICD-10-CM | POA: Diagnosis not present

## 2017-02-09 NOTE — Progress Notes (Signed)
Location:  [place] Room Number: 212D Place of Service:  SNF [phone])  [name]. [name], MD   Patient Care Team: Harlan Stains, MD as PCP - General [facility] Medicine)  Extended Emergency Contact Information Primary Emergency Contact: [name],[name] Address: [address]          [place], [place] [address] [name] of [place] Phone: [phone] Work Phone: [phone] Mobile Phone: [phone] Relation: Daughter Secondary Emergency Contact: [name],[name]  [place] Phone: [phone] Relation: Niece    Allergies: Lisinopril; Spironolactone; Vancomycin; Donepezil; Eggs or egg-derived products; Erythromycin; Macrolides and ketolides; Penicillins; Quinapril hcl; Angiotensin receptor blockers; Lipitor [atorvastatin]; Pentazocine lactate; Propoxyphene hcl; and Sulfonamide derivatives  Chief Complaint  Patient presents with  . Acute Visit    Acute    HPI: Patient is 76 y.o. female who nursing asked me to see for what looks like an infection of her L ear. Noted today. Bottom part of ear is red with some swelling. Noted today for first time. No fever or other systemic symptom but appears painful.  Past Medical History:  Diagnosis Date  . Anxiety   . CAD (coronary artery disease)    a. s/p inferior STEMI 12/29/10 with rotablator atherectomy RCA 12/31/10 and DES x 2 RCA;  b. Lexiscan Myoview (02/2015): mild reversible apical anterior perfusion defect. C. cath 02/2015 50% LAD lesion with patent stent, Tx Rx    . COPD (chronic obstructive pulmonary disease) ([place])   . Dementia   . Diabetes mellitus    type 2  . Diverticulosis   . GERD (gastroesophageal reflux disease)   . Glaucoma   . HTN (hypertension)   . Hyperlipidemia   . Ischemic cardiomyopathy    EF 45%cath 2012 EF55% 5/12; 35% 11/14  . Memory deficit   . Nephrolithiasis   . Osteoarthritis   . Overweight(278.02)   . Respiratory arrest//ACE Inhibitor presumed cause     . Sleep apnea   . Small bowel obstruction    from a sigmoid stricture    Past Surgical History:  Procedure Laterality Date  . bilateral tubal ligation    . CARDIAC CATHETERIZATION N/A 03/18/2015   Procedure: LEFT HEART CATH AND CORONARY ANGIOGRAPHY;  Surgeon: Lorretta Harp, MD;  Location: [facility] CATH LAB;  Service: Cardiovascular;  Laterality: N/A;  . CARDIAC SURGERY    . HIP ARTHROPLASTY Right 11/10/2016   Procedure: ARTHROPLASTY BIPOLAR HIP (HEMIARTHROPLASTY);  Surgeon: Marchia Bond, MD;  Location: [place];  Service: Orthopedics;  Laterality: Right;  . 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 LITHOTRIPSY      Allergies as of 02/09/2017      Reactions   Lisinopril Other (See Comments)   Reaction:  Respiratory arrest    Spironolactone Anaphylaxis   Vancomycin Anaphylaxis, Rash   Donepezil Diarrhea   Eggs Or Egg-derived Products Diarrhea   Erythromycin Other (See Comments)   Reaction:  Makes pt feel weird    Macrolides And Ketolides Other (See Comments)   Reaction:  Unknown    Penicillins Itching, Other (See Comments)   Has patient had a PCN reaction causing immediate rash, facial/tongue/throat swelling, SOB or lightheadedness with hypotension: No Has patient had a PCN reaction causing severe rash involving mucus membranes or skin necrosis: No Has patient had a PCN reaction that required hospitalization No Has patient had a PCN reaction occurring within the last 10 years: No If all of the above answers are "NO", then may  proceed with Cephalosporin use.   Quinapril Hcl Itching   Angiotensin Receptor Blockers Other (See Comments)   Reaction:  Unknown    Lipitor [atorvastatin] Palpitations   Pentazocine Lactate Other (See Comments)   Reaction:  Unknown    Propoxyphene Hcl Other (See Comments)   Reaction unknown   Sulfonamide Derivatives Other (See Comments)   Reaction:  Unknown       Medication List       Accurate as of 02/09/17  3:17 PM.  Always use your most recent med list.          acetaminophen 325 MG tablet Commonly known as:  TYLENOL Take 2 tablets (650 mg total) by mouth every 6 (six) hours as needed.   albuterol 108 (90 Base) MCG/ACT inhaler Commonly known as:  PROVENTIL HFA;VENTOLIN HFA Inhale 2 puffs into the lungs every 6 (six) hours as needed for wheezing or shortness of breath.   aspirin EC 81 MG tablet Take 81 mg by mouth daily.   atorvastatin 40 MG tablet Commonly known as:  LIPITOR Take 40 mg by mouth daily.   clopidogrel 75 MG tablet Commonly known as:  PLAVIX Take 75 mg by mouth daily.   feeding supplement (GLUCERNA SHAKE) Liqd Take 237 mLs by mouth daily.   feeding supplement (PRO-STAT SUGAR FREE 64) Liqd Take 30 mLs by mouth 2 (two) times daily. To support wound healing   furosemide 20 MG tablet Commonly known as:  LASIX Take 20 mg by mouth daily.   HUMALOG KWIKPEN 100 UNIT/ML KiwkPen Generic drug:  insulin lispro Inject 4 Units into the skin 3 (three) times daily with meals.   HYDROcodone-acetaminophen 5-325 MG tablet Commonly known as:  NORCO/VICODIN Take 1 tablet by mouth every 6 (six) hours as needed for moderate pain.   insulin glargine 100 unit/mL Sopn Commonly known as:  LANTUS Inject 0.15 mLs (15 Units total) into the skin at bedtime.   isosorbide mononitrate 30 MG 24 hr tablet Commonly known as:  IMDUR Take 0.5 tablets (15 mg total) by mouth daily.   memantine 10 MG tablet Commonly known as:  NAMENDA Take 10 mg by mouth 2 (two) times daily.   metFORMIN 1000 MG tablet Commonly known as:  GLUCOPHAGE Take 1,000 mg by mouth 2 (two) times daily with a meal.   metoprolol succinate 25 MG 24 hr tablet Commonly known as:  TOPROL XL Take 0.5 tablets (12.5 mg total) by mouth daily.   mirtazapine 15 MG disintegrating tablet Commonly known as:  REMERON SOL-TAB Take 1 tablet (15 mg total) by mouth at bedtime.   mometasone-formoterol 100-5 MCG/ACT Aero Commonly known as:   DULERA Inhale 2 puffs into the lungs 2 (two) times daily.   nitroGLYCERIN 0.4 MG SL tablet Commonly known as:  NITROSTAT Place 0.4 mg under the tongue every 5 (five) minutes as needed for chest pain.   pantoprazole 40 MG tablet Commonly known as:  PROTONIX Take 40 mg by mouth daily.   polyethylene glycol packet Commonly known as:  MIRALAX / GLYCOLAX Take 17 g by mouth daily as needed for mild constipation.   sennosides-docusate sodium 8.6-50 MG tablet Commonly known as:  SENOKOT-S Take 2 tablets by mouth daily.   Vitamin D3 50000 units Caps Take 1 capsule by mouth once a week. On saturdays       No orders of the defined types were placed in this encounter.   Immunization History  Administered Date(s) Administered  . Influenza Split 08/20/2014  . Influenza, High  Dose Seasonal PF 10/06/2013  . PPD Test 11/16/2016  . Pneumococcal Polysaccharide-23 09/24/2009    Social History  Substance Use Topics  . Smoking status: Current Every Day Smoker    Packs/day: 1.00    Years: 25.00    Types: Cigarettes  . Smokeless tobacco: Never Used     Comment: over a ppd for 40+ years; quit 10/2004.  unsuccessfully tried eCigs.  . Alcohol use No    Review of Systems  DATA OBTAINED: from patient- limited - admits to pain; nurse - as per HPI GENERAL:  no fevers, fatigue, appetite changes SKIN: No itching, rash HEENT: admits pain with manipulation of canal RESPIRATORY: No cough, wheezing, SOB CARDIAC: No chest pain, palpitations, lower extremity edema  GI: No abdominal pain, No N/V/D or constipation, No heartburn or reflux  GU: No dysuria, frequency or urgency, or incontinence  MUSCULOSKELETAL: No unrelieved bone/joint pain NEUROLOGIC: No headache, dizziness  PSYCHIATRIC: No overt anxiety or sadness  Vitals:   02/09/17 1514  BP: 135/74  Pulse: 81  Resp: 16  Temp: 98 F (36.7 C)   Body mass index is 23.8 kg/m. Physical Exam  GENERAL APPEARANCE: Alert, conversant, No  acute distress  SKIN: No diaphoresis rash HEENT- redness and swelling of tragus and behind the tragus and into the auditory canal; no d/c RESPIRATORY: Breathing is even, unlabored. Lung sounds are clear   CARDIOVASCULAR: Heart RRR no murmurs, rubs or gallops. No peripheral edema  GASTROINTESTINAL: Abdomen is soft, non-tender, not distended w/ normal bowel sounds.  GENITOURINARY: Bladder non tender, not distended  MUSCULOSKELETAL: No abnormal joints or musculature NEUROLOGIC: Cranial nerves 2-12 grossly intact. Moves all extremities PSYCHIATRIC: Mood and affect with dementia, no behavioral issues  Patient Active Problem List   Diagnosis Date Noted  . Vitamin D deficiency 01/28/2017  . UTI due to extended-spectrum beta lactamase (ESBL) producing Escherichia coli 11/18/2016  . Urinary tract infection due to Proteus 11/18/2016  . Postoperative anemia due to acute blood loss 11/18/2016  . HTN (hypertension) 11/18/2016  . Chronic diastolic CHF (congestive heart failure) ([place]) 11/12/2016  . Closed right hip fracture ([place]) 11/10/2016  . 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 Obesity (BMI 30-39.9) 10/06/2015  . Tobacco use disorder 03/17/2015  . Cardiomyopathy, ischemic 03/17/2015  . Allergy to ACE inhibitors 03/17/2015  . Ventricular tachycardia ([place]) 03/17/2015  . COPD (chronic obstructive pulmonary disease) ([place]) 12/31/2014  . Chronic cough 09/12/2013  . Memory deficit 04/23/2013  . Essential hypertension, benign 04/03/2013  . Dyslipidemia 04/03/2013  . Coronary atherosclerosis of native coronary artery 04/03/2013  . DM2 (diabetes mellitus, type 2) ([place]) 04/02/2013  . Fibromyalgia 04/20/2008    CMP     Component Value Date/Time   NA 140 12/22/2016   K 4.0 12/22/2016   CL 103 11/16/2016 0522   CO2 31 11/16/2016 0522   GLUCOSE 220 (H) 11/16/2016 0522   BUN 13 12/22/2016   CREATININE 0.6 12/22/2016   CREATININE 0.77 11/16/2016 0522   CALCIUM 8.2 (L)  11/16/2016 0522   PROT 7.7 04/27/2016 1724   ALBUMIN 4.4 04/27/2016 1724   AST 14 12/22/2016   ALT 8 12/22/2016   ALKPHOS 79 12/22/2016   BILITOT 1.2 04/27/2016 1724   GFRNONAA >60 11/16/2016 0522   GFRAA >60 11/16/2016 0522    Recent Labs  11/11/16 0228 11/12/16 0633 11/16/16 0522 12/22/16  NA 137  137 137  137 138  138 140  K 4.6  4.6 3.8  3.8 3.6 4.0  CL 101  102 103  --   CO2 24 26 31   --   GLUCOSE 227* 216* 220*  --   BUN 17  17 12  12 11  11 13   CREATININE 0.89  0.9 0.73  0.7 0.77  0.8 0.6  CALCIUM 8.4* 8.1* 8.2*  --     Recent Labs  02/28/16 1321 04/27/16 1724 12/22/16  AST 14* 22 14  ALT 11* 9* 8  ALKPHOS 60 69 79  BILITOT 0.6 1.2  --   PROT 7.0 7.7  --   ALBUMIN 3.8 4.4  --     Recent Labs  04/20/16 2200  07/03/16 1831 11/10/16 0920  11/12/16 0633 11/13/16 0508 11/16/16 0522 12/22/16  WBC 10.8*  < > 10.4 13.0  13.0*  < > 11.3  11.3* 12.1  12.1* 5.9  8.9 9.5  NEUTROABS 7.4  --  9.0* 10.3*  --   --   --   --   --   HGB 13.1  < > 13.4 13.8  13.8  < > 9.9*  9.9* 10.2*  10.2* 10.2* 11.7*  HCT 39.1  < > 39.1 41.0  41  < > 30.0*  30* 31.1*  31* 31.4* 35*  MCV 96.3  < > 97.3 98.1  < > 97.7 100.6* 100.3*  --   PLT 161  < > 162 198  198  < > 169  169 135*  135* 200 181  < > = values in this interval not displayed. No results for input(s): CHOL, LDLCALC, TRIG in the last 8760 hours.  Invalid input(s): HCL No results found for: [facility] Lab Results  Component Value Date   TSH 3.76 12/22/2016   Lab Results  Component Value Date   HGBA1C 6.6 12/22/2016   Lab Results  Component Value Date   CHOL 69 12/19/2012   HDL 31.30 (L) 12/19/2012   LDLCALC 10 12/19/2012   TRIG 138.0 12/19/2012   CHOLHDL 2 12/19/2012    Significant Diagnostic Results in last 30 days:  No results found.  Assessment and Plan  OTITIS TRAGUS EXTERNA/ INFECTION TRAGUS - corticosporin otic suspension qid for 7 days and doxyxycline 100 mg BID for 5 days;  will monitor improvement   Webb Silversmith D. [name], MD

## 2017-02-10 ENCOUNTER — Encounter: Payer: Self-pay | Admitting: Internal Medicine

## 2017-02-11 ENCOUNTER — Encounter: Payer: Self-pay | Admitting: Internal Medicine

## 2017-02-14 ENCOUNTER — Non-Acute Institutional Stay (SKILLED_NURSING_FACILITY): Payer: Medicare Other | Admitting: Internal Medicine

## 2017-02-14 ENCOUNTER — Encounter: Payer: Self-pay | Admitting: Internal Medicine

## 2017-02-14 DIAGNOSIS — F329 Major depressive disorder, single episode, unspecified: Secondary | ICD-10-CM | POA: Diagnosis not present

## 2017-02-14 DIAGNOSIS — F32A Depression, unspecified: Secondary | ICD-10-CM

## 2017-02-14 DIAGNOSIS — E785 Hyperlipidemia, unspecified: Secondary | ICD-10-CM

## 2017-02-14 DIAGNOSIS — F0390 Unspecified dementia without behavioral disturbance: Secondary | ICD-10-CM

## 2017-02-14 NOTE — Progress Notes (Signed)
Location:  [facility] Room Number: 212D Place of Service:  SNF [phone])  Noah Delaine. [name], MD  Patient Care Team: Harlan Stains, MD as PCP - General [facility])  Extended Emergency Contact Information Primary Emergency Contact: [name],[name] Address: [address]          [place], [place] [address] [name] of [place] Phone: [phone] Work Phone: [phone] Mobile Phone: [phone] Relation: Daughter Secondary Emergency Contact: [name],[name]  [place] Phone: [phone] Relation: Niece    Allergies: Lisinopril; Spironolactone; Vancomycin; Donepezil; Eggs or egg-derived products; Erythromycin; Macrolides and ketolides; Penicillins; Quinapril hcl; Angiotensin receptor blockers; Lipitor [atorvastatin]; Pentazocine lactate; Propoxyphene hcl; and Sulfonamide derivatives  Chief Complaint  Patient presents with  . Medical Management of Chronic Issues    Routine Visit    HPI: Patient is 76 y.o. female who is being seen for routine issues of HLD, dementia and depression.  Past Medical History:  Diagnosis Date  . Anxiety   . CAD (coronary artery disease)    a. s/p inferior STEMI 12/29/10 with rotablator atherectomy RCA 12/31/10 and DES x 2 RCA;  b. Lexiscan Myoview (02/2015): mild reversible apical anterior perfusion defect. C. cath 02/2015 50% LAD lesion with patent stent, Tx Rx    . COPD (chronic obstructive pulmonary disease) ([place])   . Dementia   . Diabetes mellitus    type 2  . Diverticulosis   . GERD (gastroesophageal reflux disease)   . Glaucoma   . HTN (hypertension)   . Hyperlipidemia   . Ischemic cardiomyopathy    EF 45%cath 2012 EF55% 5/12; 35% 11/14  . Memory deficit   . Nephrolithiasis   . Osteoarthritis   . Overweight(278.02)   . Respiratory arrest//ACE Inhibitor presumed cause   . Sleep apnea   . Small bowel obstruction (HCC)    from a sigmoid stricture    Past Surgical History:    Procedure Laterality Date  . bilateral tubal ligation    . CARDIAC CATHETERIZATION N/A 03/18/2015   Procedure: LEFT HEART CATH AND CORONARY ANGIOGRAPHY;  Surgeon: Lorretta Harp, MD;  Location: [facility] CATH LAB;  Service: Cardiovascular;  Laterality: N/A;  . CARDIAC SURGERY    . HIP ARTHROPLASTY Right 11/10/2016   Procedure: ARTHROPLASTY BIPOLAR HIP (HEMIARTHROPLASTY);  Surgeon: Marchia Bond, MD;  Location: [facility];  Service: Orthopedics;  Laterality: Right;  . 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 LITHOTRIPSY      Allergies as of 02/14/2017      Reactions   Lisinopril Other (See Comments)   Reaction:  Respiratory arrest    Spironolactone Anaphylaxis   Vancomycin Anaphylaxis, Rash   Donepezil Diarrhea   Eggs Or Egg-derived Products Diarrhea   Erythromycin Other (See Comments)   Reaction:  Makes pt feel weird    Macrolides And Ketolides Other (See Comments)   Reaction:  Unknown    Penicillins Itching, Other (See Comments)   Has patient had a PCN reaction causing immediate rash, facial/tongue/throat swelling, SOB or lightheadedness with hypotension: No Has patient had a PCN reaction causing severe rash involving mucus membranes or skin necrosis: No Has patient had a PCN reaction that required hospitalization No Has patient had a PCN reaction occurring within the last 10 years: No If all of the above answers are "NO", then may proceed with Cephalosporin use.   Quinapril Hcl Itching   Angiotensin Receptor Blockers Other (See Comments)   Reaction:  Unknown  Lipitor [atorvastatin] Palpitations   Pentazocine Lactate Other (See Comments)   Reaction:  Unknown    Propoxyphene Hcl Other (See Comments)   Reaction unknown   Sulfonamide Derivatives Other (See Comments)   Reaction:  Unknown       Medication List       Accurate as of 02/14/17 11:59 PM. Always use your most recent med list.          acetaminophen 325 MG tablet Commonly known as:   TYLENOL Take 2 tablets (650 mg total) by mouth every 6 (six) hours as needed.   albuterol 108 (90 Base) MCG/ACT inhaler Commonly known as:  PROVENTIL HFA;VENTOLIN HFA Inhale 2 puffs into the lungs every 6 (six) hours as needed for wheezing or shortness of breath.   aspirin EC 81 MG tablet Take 81 mg by mouth daily.   atorvastatin 40 MG tablet Commonly known as:  LIPITOR Take 40 mg by mouth daily.   clopidogrel 75 MG tablet Commonly known as:  PLAVIX Take 75 mg by mouth daily.   feeding supplement (GLUCERNA SHAKE) Liqd Take 237 mLs by mouth daily.   furosemide 20 MG tablet Commonly known as:  LASIX Take 20 mg by mouth daily.   HUMALOG KWIKPEN 100 UNIT/ML KiwkPen Generic drug:  insulin lispro Inject 4 Units into the skin 3 (three) times daily with meals.   HYDROcodone-acetaminophen 5-325 MG tablet Commonly known as:  NORCO/VICODIN Take 1 tablet by mouth every 6 (six) hours as needed for moderate pain.   insulin glargine 100 unit/mL Sopn Commonly known as:  LANTUS Inject 0.15 mLs (15 Units total) into the skin at bedtime.   isosorbide mononitrate 30 MG 24 hr tablet Commonly known as:  IMDUR Take 0.5 tablets (15 mg total) by mouth daily.   memantine 10 MG tablet Commonly known as:  NAMENDA Take 10 mg by mouth 2 (two) times daily.   metFORMIN 1000 MG tablet Commonly known as:  GLUCOPHAGE Take 1,000 mg by mouth 2 (two) times daily with a meal.   metoprolol succinate 25 MG 24 hr tablet Commonly known as:  TOPROL XL Take 0.5 tablets (12.5 mg total) by mouth daily.   mirtazapine 15 MG disintegrating tablet Commonly known as:  REMERON SOL-TAB Take 1 tablet (15 mg total) by mouth at bedtime.   mometasone-formoterol 100-5 MCG/ACT Aero Commonly known as:  DULERA Inhale 2 puffs into the lungs 2 (two) times daily.   nitroGLYCERIN 0.4 MG SL tablet Commonly known as:  NITROSTAT Place 0.4 mg under the tongue every 5 (five) minutes as needed for chest pain.    pantoprazole 40 MG tablet Commonly known as:  PROTONIX Take 40 mg by mouth daily.   polyethylene glycol packet Commonly known as:  MIRALAX / GLYCOLAX Take 17 g by mouth daily as needed for mild constipation.   sennosides-docusate sodium 8.6-50 MG tablet Commonly known as:  SENOKOT-S Take 2 tablets by mouth daily.   Vitamin D3 50000 units Caps Take 1 capsule by mouth once a week. On saturdays       No orders of the defined types were placed in this encounter.   Immunization History  Administered Date(s) Administered  . Influenza Split 08/20/2014  . Influenza, High Dose Seasonal PF 10/06/2013  . PPD Test 11/16/2016  . Pneumococcal Polysaccharide-23 09/24/2009    Social History  Substance Use Topics  . Smoking status: Current Every Day Smoker    Packs/day: 1.00    Years: 25.00    Types: Cigarettes  .  Smokeless tobacco: Never Used     Comment: over a ppd for 40+ years; quit 10/2004.  unsuccessfully tried eCigs.  . Alcohol use No    Review of Systems  DATA OBTAINED: from patient- can participate some;, nurse- no concerns GENERAL:  no fevers, fatigue, appetite changes SKIN: No itching, rash HEENT: No complaint RESPIRATORY: No cough, wheezing, SOB CARDIAC: No chest pain, palpitations, lower extremity edema  GI: No abdominal pain, No N/V/D or constipation, No heartburn or reflux  GU: No dysuria, frequency or urgency, or incontinence  MUSCULOSKELETAL: No unrelieved bone/joint pain NEUROLOGIC: No headache, dizziness  PSYCHIATRIC: No overt anxiety or sadness  Vitals:   02/14/17 1007  BP: 119/71  Pulse: 77  Resp: 18  Temp: (!) 96.9 F (36.1 C)   Body mass index is 23.8 kg/m. Physical Exam  GENERAL APPEARANCE: Alert, No acute distress  SKIN: No diaphoresis rash HEENT: Unremarkable RESPIRATORY: Breathing is even, unlabored. Lung sounds are clear   CARDIOVASCULAR: Heart RRR no murmurs, rubs or gallops. No peripheral edema  GASTROINTESTINAL: Abdomen is soft,  non-tender, not distended w/ normal bowel sounds.  GENITOURINARY: Bladder non tender, not distended  MUSCULOSKELETAL: No abnormal joints or musculature NEUROLOGIC: Cranial nerves 2-12 grossly intact. Moves all extremities PSYCHIATRIC: Mood and affect with dementia, no behavioral issues  Patient Active Problem List   Diagnosis Date Noted  . Depression 03/14/2017  . Vitamin D deficiency 01/28/2017  . UTI due to extended-spectrum beta lactamase (ESBL) producing Escherichia coli 11/18/2016  . Urinary tract infection due to Proteus 11/18/2016  . Postoperative anemia due to acute blood loss 11/18/2016  . HTN (hypertension) 11/18/2016  . Chronic diastolic CHF (congestive heart failure) ([place]) 11/12/2016  . Closed right hip fracture ([place]) 11/10/2016  . 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 Obesity (BMI 30-39.9) 10/06/2015  . Tobacco use disorder 03/17/2015  . Cardiomyopathy, ischemic 03/17/2015  . Allergy to ACE inhibitors 03/17/2015  . Ventricular tachycardia ([place]) 03/17/2015  . HLD (hyperlipidemia) 12/31/2014  . COPD (chronic obstructive pulmonary disease) ([place]) 12/31/2014  . Chronic cough 09/12/2013  . Memory deficit 04/23/2013  . Essential hypertension, benign 04/03/2013  . Dyslipidemia 04/03/2013  . Coronary atherosclerosis of native coronary artery 04/03/2013  . DM2 (diabetes mellitus, type 2) ([place]) 04/02/2013  . Fibromyalgia 04/20/2008    CMP     Component Value Date/Time   NA 140 12/22/2016   K 4.0 12/22/2016   CL 103 11/16/2016 0522   CO2 31 11/16/2016 0522   GLUCOSE 220 (H) 11/16/2016 0522   BUN 13 12/22/2016   CREATININE 0.6 12/22/2016   CREATININE 0.77 11/16/2016 0522   CALCIUM 8.2 (L) 11/16/2016 0522   PROT 7.7 04/27/2016 1724   ALBUMIN 4.4 04/27/2016 1724   AST 14 12/22/2016   ALT 8 12/22/2016   ALKPHOS 79 12/22/2016   BILITOT 1.2 04/27/2016 1724   GFRNONAA >60 11/16/2016 0522   GFRAA >60 11/16/2016 0522    Recent Labs   11/11/16 0228 11/12/16 0633 11/16/16 0522 12/22/16  NA 137  137 137  137 138  138 140  K 4.6  4.6 3.8  3.8 3.6 4.0  CL 101 102 103  --   CO2 24 26 31   --   GLUCOSE 227* 216* 220*  --   BUN 17  17 12  12 11  11 13   CREATININE 0.89  0.9 0.73  0.7 0.77  0.8 0.6  CALCIUM 8.4* 8.1* 8.2*  --     Recent Labs  04/27/16 1724 12/22/16  AST 22 14  ALT 9* 8  ALKPHOS 69 79  BILITOT 1.2  --   PROT 7.7  --   ALBUMIN 4.4  --     Recent Labs  04/20/16 2200  07/03/16 1831 11/10/16 0920  11/12/16 0633 11/13/16 0508 11/16/16 0522 12/22/16  WBC 10.8*  < > 10.4 13.0  13.0*  < > 11.3  11.3* 12.1  12.1* 5.9  8.9 9.5  NEUTROABS 7.4  --  9.0* 10.3*  --   --   --   --   --   HGB 13.1  < > 13.4 13.8  13.8  < > 9.9*  9.9* 10.2*  10.2* 10.2* 11.7*  HCT 39.1  < > 39.1 41.0  41  < > 30.0*  30* 31.1*  31* 31.4* 35*  MCV 96.3  < > 97.3 98.1  < > 97.7 100.6* 100.3*  --   PLT 161  < > 162 198  198  < > 169  169 135*  135* 200 181  < > = values in this interval not displayed.  Recent Labs  12/22/16  CHOL 117  LDLCALC 59  TRIG 183*   No results found for: [facility] Lab Results  Component Value Date   TSH 3.76 12/22/2016   Lab Results  Component Value Date   HGBA1C 6.6 12/22/2016   Lab Results  Component Value Date   CHOL 117 12/22/2016   HDL 29 (A) 12/22/2016   LDLCALC 59 12/22/2016   TRIG 183 (A) 12/22/2016   CHOLHDL 2 12/19/2012    Significant Diagnostic Results in last 30 days:  No results found.  Assessment and Plan  HLD (hyperlipidemia) LDL 59, HDL 29 , good control; plan to cont lipitor 40 mg daily,  Senile dementia without behavioral disturbance Chronic and stable; plan to cont namenda 10 mg BID  Depression Chronic and stable;plan to cont remeron 15 mg qHS    Vashon Arch D. [name], MD

## 2017-03-12 DIAGNOSIS — F0390 Unspecified dementia without behavioral disturbance: Secondary | ICD-10-CM | POA: Diagnosis not present

## 2017-03-12 DIAGNOSIS — F419 Anxiety disorder, unspecified: Secondary | ICD-10-CM | POA: Diagnosis not present

## 2017-03-12 DIAGNOSIS — F329 Major depressive disorder, single episode, unspecified: Secondary | ICD-10-CM | POA: Diagnosis not present

## 2017-03-14 ENCOUNTER — Non-Acute Institutional Stay (SKILLED_NURSING_FACILITY): Payer: Medicare Other | Admitting: Internal Medicine

## 2017-03-14 ENCOUNTER — Encounter: Payer: Self-pay | Admitting: Internal Medicine

## 2017-03-14 DIAGNOSIS — F32A Depression, unspecified: Secondary | ICD-10-CM

## 2017-03-14 DIAGNOSIS — R400 Somnolence: Secondary | ICD-10-CM

## 2017-03-14 DIAGNOSIS — F329 Major depressive disorder, single episode, unspecified: Secondary | ICD-10-CM | POA: Insufficient documentation

## 2017-03-14 HISTORY — DX: Depression, unspecified: F32.A

## 2017-03-14 NOTE — Progress Notes (Signed)
Location:  [name] Room Number: 212D Place of Service:  SNF [phone])  [name]. [name], MD  Patient Care Team: Harlan Stains, MD as PCP - General [facility] Medicine)  Extended Emergency Contact Information Primary Emergency Contact: [name] Address: [address]          [place], [name] [address] [name] of [place] Phone: [phone] Work Phone: [phone] Mobile Phone: [phone] Relation: Daughter Secondary Emergency Contact: [name],[name]  [name] Phone: [phone] Relation: Niece    Allergies: Lisinopril; Spironolactone; Vancomycin; Donepezil; Eggs or egg-derived products; Erythromycin; Macrolides and ketolides; Penicillins; Quinapril hcl; Angiotensin receptor blockers; Lipitor [atorvastatin]; Pentazocine lactate; Propoxyphene hcl; and Sulfonamide derivatives  Chief Complaint  Patient presents with  . Medical Management of Chronic Issues    Routine Visit    HPI: Patient is 76 y.o. female who is being seen because she has been noted to be sleeping a lot this week. When she's up she is her usual self. No MS changes, no colds or coughs, fever, dysuria or other  Past Medical History:  Diagnosis Date  . Anxiety   . CAD (coronary artery disease)    a. s/p inferior STEMI 12/29/10 with rotablator atherectomy RCA 12/31/10 and DES x 2 RCA;  b. Lexiscan Myoview (02/2015): mild reversible apical anterior perfusion defect. C. cath 02/2015 50% LAD lesion with patent stent, Tx Rx    . COPD (chronic obstructive pulmonary disease) ([place])   . Dementia   . Diabetes mellitus    type 2  . Diverticulosis   . GERD (gastroesophageal reflux disease)   . Glaucoma   . HTN (hypertension)   . Hyperlipidemia   . Ischemic cardiomyopathy    EF 45%cath 2012 EF55% 5/12; 35% 11/14  . Memory deficit   . Nephrolithiasis   . Osteoarthritis   . Overweight(278.02)   . Respiratory arrest//ACE Inhibitor presumed cause   . Sleep  apnea   . Small bowel obstruction (HCC)    from a sigmoid stricture    Past Surgical History:  Procedure Laterality Date  . bilateral tubal ligation    . CARDIAC CATHETERIZATION N/A 03/18/2015   Procedure: LEFT HEART CATH AND CORONARY ANGIOGRAPHY;  Surgeon: Lorretta Harp, MD;  Location: [facility] CATH LAB;  Service: Cardiovascular;  Laterality: N/A;  . CARDIAC SURGERY    . HIP ARTHROPLASTY Right 11/10/2016   Procedure: ARTHROPLASTY BIPOLAR HIP (HEMIARTHROPLASTY);  Surgeon: Marchia Bond, MD;  Location: [facility];  Service: Orthopedics;  Laterality: Right;  . 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 LITHOTRIPSY      Allergies as of 03/14/2017      Reactions   Lisinopril Other (See Comments)   Reaction:  Respiratory arrest    Spironolactone Anaphylaxis   Vancomycin Anaphylaxis, Rash   Donepezil Diarrhea   Eggs Or Egg-derived Products Diarrhea   Erythromycin Other (See Comments)   Reaction:  Makes pt feel weird    Macrolides And Ketolides Other (See Comments)   Reaction:  Unknown    Penicillins Itching, Other (See Comments)   Has patient had a PCN reaction causing immediate rash, facial/tongue/throat swelling, SOB or lightheadedness with hypotension: No Has patient had a PCN reaction causing severe rash involving mucus membranes or skin necrosis: No Has patient had a PCN reaction that required hospitalization No Has patient had a PCN reaction occurring within the last 10 years: No If all of the above answers are "NO", then may proceed with Cephalosporin  use.   Quinapril Hcl Itching   Angiotensin Receptor Blockers Other (See Comments)   Reaction:  Unknown    Lipitor [atorvastatin] Palpitations   Pentazocine Lactate Other (See Comments)   Reaction:  Unknown    Propoxyphene Hcl Other (See Comments)   Reaction unknown   Sulfonamide Derivatives Other (See Comments)   Reaction:  Unknown       Medication List       Accurate as of 03/14/17 12:30 PM. Always  use your most recent med list.          acetaminophen 325 MG tablet Commonly known as:  TYLENOL Take 2 tablets (650 mg total) by mouth every 6 (six) hours as needed.   albuterol 108 (90 Base) MCG/ACT inhaler Commonly known as:  PROVENTIL HFA;VENTOLIN HFA Inhale 2 puffs into the lungs every 6 (six) hours as needed for wheezing or shortness of breath.   aspirin EC 81 MG tablet Take 81 mg by mouth daily.   atorvastatin 40 MG tablet Commonly known as:  LIPITOR Take 40 mg by mouth daily.   clopidogrel 75 MG tablet Commonly known as:  PLAVIX Take 75 mg by mouth daily.   feeding supplement (GLUCERNA SHAKE) Liqd Take 237 mLs by mouth daily.   furosemide 20 MG tablet Commonly known as:  LASIX Take 20 mg by mouth daily.   HUMALOG KWIKPEN 100 UNIT/ML KiwkPen Generic drug:  insulin lispro Inject 4 Units into the skin 3 (three) times daily with meals.   HYDROcodone-acetaminophen 5-325 MG tablet Commonly known as:  NORCO/VICODIN Take 1 tablet by mouth every 6 (six) hours as needed for moderate pain.   insulin glargine 100 unit/mL Sopn Commonly known as:  LANTUS Inject 0.15 mLs (15 Units total) into the skin at bedtime.   isosorbide mononitrate 30 MG 24 hr tablet Commonly known as:  IMDUR Take 0.5 tablets (15 mg total) by mouth daily.   memantine 10 MG tablet Commonly known as:  NAMENDA Take 10 mg by mouth 2 (two) times daily.   metFORMIN 1000 MG tablet Commonly known as:  GLUCOPHAGE Take 1,000 mg by mouth 2 (two) times daily with a meal.   metoprolol succinate 25 MG 24 hr tablet Commonly known as:  TOPROL XL Take 0.5 tablets (12.5 mg total) by mouth daily.   mirtazapine 15 MG disintegrating tablet Commonly known as:  REMERON SOL-TAB Take 1 tablet (15 mg total) by mouth at bedtime.   mometasone-formoterol 100-5 MCG/ACT Aero Commonly known as:  DULERA Inhale 2 puffs into the lungs 2 (two) times daily.   nitroGLYCERIN 0.4 MG SL tablet Commonly known as:   NITROSTAT Place 0.4 mg under the tongue every 5 (five) minutes as needed for chest pain.   pantoprazole 40 MG tablet Commonly known as:  PROTONIX Take 40 mg by mouth daily.   polyethylene glycol packet Commonly known as:  MIRALAX / GLYCOLAX Take 17 g by mouth daily as needed for mild constipation.   sennosides-docusate sodium 8.6-50 MG tablet Commonly known as:  SENOKOT-S Take 2 tablets by mouth daily.   Vitamin D3 50000 units Caps Take 1 capsule by mouth once a week. On saturdays       No orders of the defined types were placed in this encounter.   Immunization History  Administered Date(s) Administered  . Influenza Split 08/20/2014  . Influenza, High Dose Seasonal PF 10/06/2013  . PPD Test 11/16/2016  . Pneumococcal Polysaccharide-23 09/24/2009    Social History  Substance Use Topics  . Smoking  status: Current Every Day Smoker    Packs/day: 1.00    Years: 25.00    Types: Cigarettes  . Smokeless tobacco: Never Used     Comment: over a ppd for 40+ years; quit 10/2004.  unsuccessfully tried eCigs.  . Alcohol use No    Review of Systems  DATA OBTAINED: from patient, nurse- reports pt is up all night wandering, not sleeping GENERAL:  no fevers, fatigue, appetite changes SKIN: No itching, rash HEENT: No complaint RESPIRATORY: No cough, wheezing, SOB CARDIAC: No chest pain, palpitations, lower extremity edema  GI: No abdominal pain, No N/V/D or constipation, No heartburn or reflux  GU: No dysuria, frequency or urgency, or incontinence  MUSCULOSKELETAL: No unrelieved bone/joint pain NEUROLOGIC: No headache, dizziness  PSYCHIATRIC: No overt anxiety or sadness  Vitals:   03/14/17 1217  BP: 134/78  Pulse: 77  Resp: 18  Temp: 97.1 F (36.2 C)   Body mass index is 24 kg/m. Physical Exam  GENERAL APPEARANCE: Alert, No acute distress  SKIN: No diaphoresis rash HEENT: Unremarkable RESPIRATORY: Breathing is even, unlabored. Lung sounds are clear    CARDIOVASCULAR: Heart RRR no murmurs, rubs or gallops. No peripheral edema  GASTROINTESTINAL: Abdomen is soft, non-tender, not distended w/ normal bowel sounds.  GENITOURINARY: Bladder non tender, not distended  MUSCULOSKELETAL: No abnormal joints or musculature NEUROLOGIC: Cranial nerves 2-12 grossly intact. Moves all extremities PSYCHIATRIC: Mood and affect with dementia, no behavioral issues  Patient Active Problem List   Diagnosis Date Noted  . Vitamin D deficiency 01/28/2017  . UTI due to extended-spectrum beta lactamase (ESBL) producing Escherichia coli 11/18/2016  . Urinary tract infection due to Proteus 11/18/2016  . Postoperative anemia due to acute blood loss 11/18/2016  . HTN (hypertension) 11/18/2016  . Chronic diastolic CHF (congestive heart failure) ([place]) 11/12/2016  . Closed right hip fracture ([place]) 11/10/2016  . 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 Obesity (BMI 30-39.9) 10/06/2015  . Tobacco use disorder 03/17/2015  . Cardiomyopathy, ischemic 03/17/2015  . Allergy to ACE inhibitors 03/17/2015  . Ventricular tachycardia ([place]) 03/17/2015  . COPD (chronic obstructive pulmonary disease) ([place]) 12/31/2014  . Chronic cough 09/12/2013  . Memory deficit 04/23/2013  . Essential hypertension, benign 04/03/2013  . Dyslipidemia 04/03/2013  . Coronary atherosclerosis of native coronary artery 04/03/2013  . DM2 (diabetes mellitus, type 2) ([place]) 04/02/2013  . Fibromyalgia 04/20/2008    CMP     Component Value Date/Time   NA 140 12/22/2016   K 4.0 12/22/2016   CL 103 11/16/2016 0522   CO2 31 11/16/2016 0522   GLUCOSE 220 (H) 11/16/2016 0522   BUN 13 12/22/2016   CREATININE 0.6 12/22/2016   CREATININE 0.77 11/16/2016 0522   CALCIUM 8.2 (L) 11/16/2016 0522   PROT 7.7 04/27/2016 1724   ALBUMIN 4.4 04/27/2016 1724   AST 14 12/22/2016   ALT 8 12/22/2016   ALKPHOS 79 12/22/2016   BILITOT 1.2 04/27/2016 1724   GFRNONAA >60 11/16/2016 0522   GFRAA  >60 11/16/2016 0522    Recent Labs  11/11/16 0228 11/12/16 0633 11/16/16 0522 12/22/16  NA 137  137 137  137 138  138 140  K 4.6  4.6 3.8  3.8 3.6 4.0  CL 101 102 103  --   CO2 24 26 31   --   GLUCOSE 227* 216* 220*  --   BUN 17  17 12  12 11  11 13   CREATININE 0.89  0.9 0.73  0.7 0.77  0.8 0.6  CALCIUM 8.4* 8.1* 8.2*  --     Recent Labs  04/27/16 1724 12/22/16  AST 22 14  ALT 9* 8  ALKPHOS 69 79  BILITOT 1.2  --   PROT 7.7  --   ALBUMIN 4.4  --     Recent Labs  04/20/16 2200  07/03/16 1831 11/10/16 0920  11/12/16 0633 11/13/16 0508 11/16/16 0522 12/22/16  WBC 10.8*  < > 10.4 13.0  13.0*  < > 11.3  11.3* 12.1  12.1* 5.9  8.9 9.5  NEUTROABS 7.4  --  9.0* 10.3*  --   --   --   --   --   HGB 13.1  < > 13.4 13.8  13.8  < > 9.9*  9.9* 10.2*  10.2* 10.2* 11.7*  HCT 39.1  < > 39.1 41.0  41  < > 30.0*  30* 31.1*  31* 31.4* 35*  MCV 96.3  < > 97.3 98.1  < > 97.7 100.6* 100.3*  --   PLT 161  < > 162 198  198  < > 169  169 135*  135* 200 181  < > = values in this interval not displayed.  Recent Labs  12/22/16  CHOL 117  LDLCALC 59  TRIG 183*   No results found for: [facility] Lab Results  Component Value Date   TSH 3.76 12/22/2016   Lab Results  Component Value Date   HGBA1C 6.6 12/22/2016   Lab Results  Component Value Date   CHOL 117 12/22/2016   HDL 29 (A) 12/22/2016   LDLCALC 59 12/22/2016   TRIG 183 (A) 12/22/2016   CHOLHDL 2 12/19/2012    Significant Diagnostic Results in last 30 days:  No results found.  Assessment and Plan  DAYTIME SLEEPINESS-  I have reviewed all pt's meds; there is nothing theres to make her sleepy; have written for trazodone 50 mg qHS for sleep; if she sleeps at night she won't sleep during the day ; will follow resilts      Webb Silversmith D. [name], MD

## 2017-03-14 NOTE — Assessment & Plan Note (Signed)
Chronic and stable; plan to cont namenda 10 mg BID

## 2017-03-14 NOTE — Assessment & Plan Note (Signed)
LDL 59, HDL 29 , good control; plan to cont lipitor 40 mg daily,

## 2017-03-14 NOTE — Assessment & Plan Note (Signed)
Chronic and stable;plan to cont remeron 15 mg qHS

## 2017-04-03 ENCOUNTER — Encounter: Payer: Self-pay | Admitting: Internal Medicine

## 2017-04-03 ENCOUNTER — Non-Acute Institutional Stay (SKILLED_NURSING_FACILITY): Payer: Medicare Other | Admitting: Internal Medicine

## 2017-04-03 DIAGNOSIS — R0789 Other chest pain: Secondary | ICD-10-CM | POA: Diagnosis not present

## 2017-04-03 DIAGNOSIS — J189 Pneumonia, unspecified organism: Secondary | ICD-10-CM

## 2017-04-03 DIAGNOSIS — R05 Cough: Secondary | ICD-10-CM

## 2017-04-03 DIAGNOSIS — R059 Cough, unspecified: Secondary | ICD-10-CM

## 2017-04-03 NOTE — Progress Notes (Signed)
Location:  [place] Room Number: 212D Place of Service:  SNF [phone])  [name]. [name], MD  Patient Care Team: Harlan Stains, MD as PCP - General [facility] Medicine)  Extended Emergency Contact Information Primary Emergency Contact: [name],[name] Address: [address]          [place], [place] [address] [name] of [place] Phone: [phone] Work Phone: [phone] Mobile Phone: [phone] Relation: Daughter Secondary Emergency Contact: [name],[name]  [place] Phone: [phone] Relation: Niece    Allergies: Lisinopril; Spironolactone; Vancomycin; Donepezil; Eggs or egg-derived products; Erythromycin; Macrolides and ketolides; Penicillins; Quinapril hcl; Angiotensin receptor blockers; Lipitor [atorvastatin]; Pentazocine lactate; Propoxyphene hcl; and Sulfonamide derivatives  Chief Complaint  Patient presents with  . Acute Visit    Acute    HPI: Patient is 76 y.o. female who is being seen acutely for cough and R side chest wall pain, worse with breathing , cough. Nursing report no fever. Pt cannot give good hx but acts like she doesn't feel well.  Past Medical History:  Diagnosis Date  . Anxiety   . CAD (coronary artery disease)    a. s/p inferior STEMI 12/29/10 with rotablator atherectomy RCA 12/31/10 and DES x 2 RCA;  b. Lexiscan Myoview (02/2015): mild reversible apical anterior perfusion defect. C. cath 02/2015 50% LAD lesion with patent stent, Tx Rx    . COPD (chronic obstructive pulmonary disease) ([place])   . Dementia   . Diabetes mellitus    type 2  . Diverticulosis   . GERD (gastroesophageal reflux disease)   . Glaucoma   . HTN (hypertension)   . Hyperlipidemia   . Ischemic cardiomyopathy    EF 45%cath 2012 EF55% 5/12; 35% 11/14  . Memory deficit   . Nephrolithiasis   . Osteoarthritis   . Overweight(278.02)   . Respiratory arrest//ACE Inhibitor presumed cause   . Sleep apnea   . Small bowel  obstruction (HCC)    from a sigmoid stricture    Past Surgical History:  Procedure Laterality Date  . bilateral tubal ligation    . CARDIAC CATHETERIZATION N/A 03/18/2015   Procedure: LEFT HEART CATH AND CORONARY ANGIOGRAPHY;  Surgeon: Lorretta Harp, MD;  Location: [facility] CATH LAB;  Service: Cardiovascular;  Laterality: N/A;  . CARDIAC SURGERY    . HIP ARTHROPLASTY Right 11/10/2016   Procedure: ARTHROPLASTY BIPOLAR HIP (HEMIARTHROPLASTY);  Surgeon: Marchia Bond, MD;  Location: [place];  Service: Orthopedics;  Laterality: Right;  . 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 LITHOTRIPSY      Allergies as of 04/03/2017      Reactions   Lisinopril Other (See Comments)   Reaction:  Respiratory arrest    Spironolactone Anaphylaxis   Vancomycin Anaphylaxis, Rash   Donepezil Diarrhea   Eggs Or Egg-derived Products Diarrhea   Erythromycin Other (See Comments)   Reaction:  Makes pt feel weird    Macrolides And Ketolides Other (See Comments)   Reaction:  Unknown    Penicillins Itching, Other (See Comments)   Has patient had a PCN reaction causing immediate rash, facial/tongue/throat swelling, SOB or lightheadedness with hypotension: No Has patient had a PCN reaction causing severe rash involving mucus membranes or skin necrosis: No Has patient had a PCN reaction that required hospitalization No Has patient had a PCN reaction occurring within the last 10 years: No If all of the above answers are "NO", then may proceed with Cephalosporin use.   Quinapril Hcl  Itching   Angiotensin Receptor Blockers Other (See Comments)   Reaction:  Unknown    Lipitor [atorvastatin] Palpitations   Pentazocine Lactate Other (See Comments)   Reaction:  Unknown    Propoxyphene Hcl Other (See Comments)   Reaction unknown   Sulfonamide Derivatives Other (See Comments)   Reaction:  Unknown       Medication List       Accurate as of 04/03/17  2:01 PM. Always use your most recent med  list.          acetaminophen 325 MG tablet Commonly known as:  TYLENOL Take 2 tablets (650 mg total) by mouth every 6 (six) hours as needed.   albuterol 108 (90 Base) MCG/ACT inhaler Commonly known as:  PROVENTIL HFA;VENTOLIN HFA Inhale 2 puffs into the lungs every 6 (six) hours as needed for wheezing or shortness of breath.   aspirin EC 81 MG tablet Take 81 mg by mouth daily.   atorvastatin 40 MG tablet Commonly known as:  LIPITOR Take 40 mg by mouth daily.   clopidogrel 75 MG tablet Commonly known as:  PLAVIX Take 75 mg by mouth daily.   feeding supplement (GLUCERNA SHAKE) Liqd Take 237 mLs by mouth daily.   furosemide 20 MG tablet Commonly known as:  LASIX Take 20 mg by mouth daily.   HUMALOG KWIKPEN 100 UNIT/ML KiwkPen Generic drug:  insulin lispro Inject 4 Units into the skin 3 (three) times daily with meals.   HYDROcodone-acetaminophen 5-325 MG tablet Commonly known as:  NORCO/VICODIN Take 1 tablet by mouth every 6 (six) hours as needed for moderate pain.   insulin glargine 100 unit/mL Sopn Commonly known as:  LANTUS Inject 0.15 mLs (15 Units total) into the skin at bedtime.   isosorbide mononitrate 30 MG 24 hr tablet Commonly known as:  IMDUR Take 0.5 tablets (15 mg total) by mouth daily.   memantine 10 MG tablet Commonly known as:  NAMENDA Take 10 mg by mouth 2 (two) times daily.   metFORMIN 1000 MG tablet Commonly known as:  GLUCOPHAGE Take 1,000 mg by mouth 2 (two) times daily with a meal.   metoprolol succinate 25 MG 24 hr tablet Commonly known as:  TOPROL XL Take 0.5 tablets (12.5 mg total) by mouth daily.   mirtazapine 15 MG disintegrating tablet Commonly known as:  REMERON SOL-TAB Take 1 tablet (15 mg total) by mouth at bedtime.   mometasone-formoterol 100-5 MCG/ACT Aero Commonly known as:  DULERA Inhale 2 puffs into the lungs 2 (two) times daily.   nitroGLYCERIN 0.4 MG SL tablet Commonly known as:  NITROSTAT Place 0.4 mg under the  tongue every 5 (five) minutes as needed for chest pain.   pantoprazole 40 MG tablet Commonly known as:  PROTONIX Take 40 mg by mouth daily.   polyethylene glycol packet Commonly known as:  MIRALAX / GLYCOLAX Take 17 g by mouth daily as needed for mild constipation.   sennosides-docusate sodium 8.6-50 MG tablet Commonly known as:  SENOKOT-S Take 2 tablets by mouth daily.   traZODone 50 MG tablet Commonly known as:  DESYREL Take 50 mg by mouth at bedtime.   Vitamin D3 50000 units Caps Take 1 capsule by mouth once a week. On saturdays       Meds ordered this encounter  Medications  . traZODone (DESYREL) 50 MG tablet    Sig: Take 50 mg by mouth at bedtime.    Immunization History  Administered Date(s) Administered  . Influenza Split 08/20/2014  .  Influenza, High Dose Seasonal PF 10/06/2013  . PPD Test 11/16/2016  . Pneumococcal Polysaccharide-23 09/24/2009    Social History  Substance Use Topics  . Smoking status: Current Every Day Smoker    Packs/day: 1.00    Years: 25.00    Types: Cigarettes  . Smokeless tobacco: Never Used     Comment: over a ppd for 40+ years; quit 10/2004.  unsuccessfully tried eCigs.  . Alcohol use No    Review of Systems  DATA OBTAINED: from patient- can participate in limited fashion, nurse- as per HPI GENERAL:  no fevers,+ fatigue, appetite changes SKIN: No itching, rash HEENT: No complaint RESPIRATORY: + cough, no wheezing, no SOB CARDIAC: + chest pain R side, no palpitations, lower extremity edema  GI: No abdominal pain, No N/V/D or constipation, No heartburn or reflux  GU: No dysuria, frequency or urgency, or incontinence  MUSCULOSKELETAL: No unrelieved bone/joint pain NEUROLOGIC: No headache, dizziness  PSYCHIATRIC: No overt anxiety or sadness  Vitals:   04/03/17 1350  BP: 134/78  Pulse: 80  Resp: 18  Temp: 97.1 F (36.2 C)   Body mass index is 23.33 kg/m. Physical Exam  GENERAL APPEARANCE: Alert, min conversant, No  acute distress  SKIN: No diaphoresis rash HEENT: Unremarkable RESPIRATORY: Breathing is even, unlabored. Lung sounds are slt coarse , no splinting CARDIOVASCULAR: Heart RRR no murmurs, rubs or gallops. No peripheral edema  GASTROINTESTINAL: Abdomen is soft, non-tender, not distended w/ normal bowel sounds.  GENITOURINARY: Bladder non tender, not distended  MUSCULOSKELETAL: No abnormal joints or musculature NEUROLOGIC: Cranial nerves 2-12 grossly intact. Moves all extremities PSYCHIATRIC: Mood and affect with dementia, no behavioral issues  Patient Active Problem List   Diagnosis Date Noted  . Depression 03/14/2017  . Vitamin D deficiency 01/28/2017  . UTI due to extended-spectrum beta lactamase (ESBL) producing Escherichia coli 11/18/2016  . Urinary tract infection due to Proteus 11/18/2016  . Postoperative anemia due to acute blood loss 11/18/2016  . HTN (hypertension) 11/18/2016  . Chronic diastolic CHF (congestive heart failure) ([place]) 11/12/2016  . Closed right hip fracture ([place]) 11/10/2016  . 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 Obesity (BMI 30-39.9) 10/06/2015  . Tobacco use disorder 03/17/2015  . Cardiomyopathy, ischemic 03/17/2015  . Allergy to ACE inhibitors 03/17/2015  . Ventricular tachycardia ([place]) 03/17/2015  . HLD (hyperlipidemia) 12/31/2014  . COPD (chronic obstructive pulmonary disease) ([place]) 12/31/2014  . Chronic cough 09/12/2013  . Memory deficit 04/23/2013  . Essential hypertension, benign 04/03/2013  . Dyslipidemia 04/03/2013  . Coronary atherosclerosis of native coronary artery 04/03/2013  . DM2 (diabetes mellitus, type 2) ([place]) 04/02/2013  . Fibromyalgia 04/20/2008    CMP     Component Value Date/Time   NA 140 12/22/2016   K 4.0 12/22/2016   CL 103 11/16/2016 0522   CO2 31 11/16/2016 0522   GLUCOSE 220 (H) 11/16/2016 0522   BUN 13 12/22/2016   CREATININE 0.6 12/22/2016   CREATININE 0.77 11/16/2016 0522   CALCIUM 8.2 (L)  11/16/2016 0522   PROT 7.7 04/27/2016 1724   ALBUMIN 4.4 04/27/2016 1724   AST 14 12/22/2016   ALT 8 12/22/2016   ALKPHOS 79 12/22/2016   BILITOT 1.2 04/27/2016 1724   GFRNONAA >60 11/16/2016 0522   GFRAA >60 11/16/2016 0522    Recent Labs  11/11/16 0228 11/12/16 0633 11/16/16 0522 12/22/16  NA 137  137 137  137 138  138 140  K 4.6  4.6 3.8  3.8 3.6 4.0  CL 101 102  103  --   CO2 24 26 31   --   GLUCOSE 227* 216* 220*  --   BUN 17  17 12  12 11  11 13   CREATININE 0.89  0.9 0.73  0.7 0.77  0.8 0.6  CALCIUM 8.4* 8.1* 8.2*  --     Recent Labs  04/27/16 1724 12/22/16  AST 22 14  ALT 9* 8  ALKPHOS 69 79  BILITOT 1.2  --   PROT 7.7  --   ALBUMIN 4.4  --     Recent Labs  04/20/16 2200  07/03/16 1831 11/10/16 0920  11/12/16 0633 11/13/16 0508 11/16/16 0522 12/22/16  WBC 10.8*  < > 10.4 13.0  13.0*  < > 11.3  11.3* 12.1  12.1* 5.9  8.9 9.5  NEUTROABS 7.4  --  9.0* 10.3*  --   --   --   --   --   HGB 13.1  < > 13.4 13.8  13.8  < > 9.9*  9.9* 10.2*  10.2* 10.2* 11.7*  HCT 39.1  < > 39.1 41.0  41  < > 30.0*  30* 31.1*  31* 31.4* 35*  MCV 96.3  < > 97.3 98.1  < > 97.7 100.6* 100.3*  --   PLT 161  < > 162 198  198  < > 169  169 135*  135* 200 181  < > = values in this interval not displayed.  Recent Labs  12/22/16  CHOL 117  LDLCALC 59  TRIG 183*   No results found for: [facility] Lab Results  Component Value Date   TSH 3.76 12/22/2016   Lab Results  Component Value Date   HGBA1C 6.6 12/22/2016   Lab Results  Component Value Date   CHOL 117 12/22/2016   HDL 29 (A) 12/22/2016   LDLCALC 59 12/22/2016   TRIG 183 (A) 12/22/2016   CHOLHDL 2 12/19/2012    Significant Diagnostic Results in last 30 days:  No results found.  Assessment and Plan  COUGH/CHEST WALL PAIN/ HCAP  - have ordered CXT, BMP and CBC; have ordered mucinex 600 mg q 12 for 7 days and duonebs TID for 7 days  Later entry - CXR with bibasilar infiltrates; pt's CrCl  93- have written for levaquin 750 mg daily for 7 days  No problem-specific Assessment & Plan notes found for this encounter.   Labs/tests ordered:    [name]. [name], MD

## 2017-04-04 DIAGNOSIS — D649 Anemia, unspecified: Secondary | ICD-10-CM | POA: Diagnosis not present

## 2017-04-04 DIAGNOSIS — I10 Essential (primary) hypertension: Secondary | ICD-10-CM | POA: Diagnosis not present

## 2017-04-04 LAB — BASIC METABOLIC PANEL
BUN: 17 mg/dL (ref 4–21)
CREATININE: 0.7 mg/dL (ref 0.5–1.1)
Glucose: 126 mg/dL
POTASSIUM: 3.4 mmol/L (ref 3.4–5.3)
Sodium: 144 mmol/L (ref 137–147)

## 2017-04-04 LAB — CBC AND DIFFERENTIAL
HCT: 36 % (ref 36–46)
Hemoglobin: 11.6 g/dL — AB (ref 12.0–16.0)
PLATELETS: 199 10*3/uL (ref 150–399)
WBC: 11.5 10^3/mL

## 2017-04-10 ENCOUNTER — Non-Acute Institutional Stay (SKILLED_NURSING_FACILITY): Payer: Medicare Other | Admitting: Internal Medicine

## 2017-04-10 ENCOUNTER — Encounter: Payer: Self-pay | Admitting: Internal Medicine

## 2017-04-10 DIAGNOSIS — E559 Vitamin D deficiency, unspecified: Secondary | ICD-10-CM

## 2017-04-10 DIAGNOSIS — I5032 Chronic diastolic (congestive) heart failure: Secondary | ICD-10-CM | POA: Diagnosis not present

## 2017-04-10 DIAGNOSIS — I25118 Atherosclerotic heart disease of native coronary artery with other forms of angina pectoris: Secondary | ICD-10-CM

## 2017-04-10 NOTE — Progress Notes (Signed)
Location:  [place] Room Number: 212D Place of Service:  SNF [phone])  Noah Delaine. [name], MD  Patient Care Team: Harlan Stains, MD as PCP - General [facility])  Extended Emergency Contact Information Primary Emergency Contact: [name],[name] Address: [address]          [place], [place] [address] [name] of [place] Phone: [phone] Work Phone: [phone] Mobile Phone: [phone] Relation: Daughter Secondary Emergency Contact: [name],[name]  [place] Phone: [phone] Relation: Niece    Allergies: Lisinopril; Spironolactone; Vancomycin; Donepezil; Eggs or egg-derived products; Erythromycin; Macrolides and ketolides; Penicillins; Quinapril hcl; Angiotensin receptor blockers; Lipitor [atorvastatin]; Pentazocine lactate; Propoxyphene hcl; and Sulfonamide derivatives  Chief Complaint  Patient presents with  . Medical Management of Chronic Issues    Routine Visit    HPI: Patient is 76 y.o. female who Is being seen for routine issues of vitamin D deficiency, chronic diastolic congestive heart failure, and coronary artery disease.  Past Medical History:  Diagnosis Date  . Anxiety   . CAD (coronary artery disease)    a. s/p inferior STEMI 12/29/10 with rotablator atherectomy RCA 12/31/10 and DES x 2 RCA;  b. Lexiscan Myoview (02/2015): mild reversible apical anterior perfusion defect. C. cath 02/2015 50% LAD lesion with patent stent, Tx Rx    . COPD (chronic obstructive pulmonary disease) ([place])   . Dementia   . Diabetes mellitus    type 2  . Diverticulosis   . GERD (gastroesophageal reflux disease)   . Glaucoma   . HTN (hypertension)   . Hyperlipidemia   . Ischemic cardiomyopathy    EF 45%cath 2012 EF55% 5/12; 35% 11/14  . Memory deficit   . Nephrolithiasis   . Osteoarthritis   . Overweight(278.02)   . Respiratory arrest//ACE Inhibitor presumed cause   . Sleep apnea   . Small bowel obstruction (HCC)     from a sigmoid stricture    Past Surgical History:  Procedure Laterality Date  . bilateral tubal ligation    . CARDIAC CATHETERIZATION N/A 03/18/2015   Procedure: LEFT HEART CATH AND CORONARY ANGIOGRAPHY;  Surgeon: Lorretta Harp, MD;  Location: [facility] CATH LAB;  Service: Cardiovascular;  Laterality: N/A;  . CARDIAC SURGERY    . HIP ARTHROPLASTY Right 11/10/2016   Procedure: ARTHROPLASTY BIPOLAR HIP (HEMIARTHROPLASTY);  Surgeon: Marchia Bond, MD;  Location: [place];  Service: Orthopedics;  Laterality: Right;  . 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 LITHOTRIPSY      Allergies as of 04/10/2017      Reactions   Lisinopril Other (See Comments)   Reaction:  Respiratory arrest    Spironolactone Anaphylaxis   Vancomycin Anaphylaxis, Rash   Donepezil Diarrhea   Eggs Or Egg-derived Products Diarrhea   Erythromycin Other (See Comments)   Reaction:  Makes pt feel weird    Macrolides And Ketolides Other (See Comments)   Reaction:  Unknown    Penicillins Itching, Other (See Comments)   Has patient had a PCN reaction causing immediate rash, facial/tongue/throat swelling, SOB or lightheadedness with hypotension: No Has patient had a PCN reaction causing severe rash involving mucus membranes or skin necrosis: No Has patient had a PCN reaction that required hospitalization No Has patient had a PCN reaction occurring within the last 10 years: No If all of the above answers are "NO", then may proceed with Cephalosporin use.   Quinapril Hcl Itching   Angiotensin Receptor Blockers Other (See Comments)  Reaction:  Unknown    Lipitor [atorvastatin] Palpitations   Pentazocine Lactate Other (See Comments)   Reaction:  Unknown    Propoxyphene Hcl Other (See Comments)   Reaction unknown   Sulfonamide Derivatives Other (See Comments)   Reaction:  Unknown       Medication List       Accurate as of 04/10/17 11:59 PM. Always use your most recent med list.            acetaminophen 325 MG tablet Commonly known as:  TYLENOL Take 2 tablets (650 mg total) by mouth every 6 (six) hours as needed.   albuterol 108 (90 Base) MCG/ACT inhaler Commonly known as:  PROVENTIL HFA;VENTOLIN HFA Inhale 2 puffs into the lungs every 6 (six) hours as needed for wheezing or shortness of breath.   aspirin EC 81 MG tablet Take 81 mg by mouth daily.   atorvastatin 40 MG tablet Commonly known as:  LIPITOR Take 40 mg by mouth daily.   clopidogrel 75 MG tablet Commonly known as:  PLAVIX Take 75 mg by mouth daily.   feeding supplement (GLUCERNA SHAKE) Liqd Take 237 mLs by mouth 2 (two) times daily between meals.   furosemide 20 MG tablet Commonly known as:  LASIX Take 20 mg by mouth daily.   HUMALOG KWIKPEN 100 UNIT/ML KiwkPen Generic drug:  insulin lispro Inject 4 Units into the skin 3 (three) times daily with meals.   HYDROcodone-acetaminophen 5-325 MG tablet Commonly known as:  NORCO/VICODIN Take 1 tablet by mouth every 6 (six) hours as needed for moderate pain.   insulin glargine 100 unit/mL Sopn Commonly known as:  LANTUS Inject 0.15 mLs (15 Units total) into the skin at bedtime.   isosorbide mononitrate 30 MG 24 hr tablet Commonly known as:  IMDUR Take 0.5 tablets (15 mg total) by mouth daily.   memantine 10 MG tablet Commonly known as:  NAMENDA Take 10 mg by mouth 2 (two) times daily.   metFORMIN 1000 MG tablet Commonly known as:  GLUCOPHAGE Take 1,000 mg by mouth 2 (two) times daily with a meal.   metoprolol succinate 25 MG 24 hr tablet Commonly known as:  TOPROL XL Take 0.5 tablets (12.5 mg total) by mouth daily.   mirtazapine 15 MG disintegrating tablet Commonly known as:  REMERON SOL-TAB Take 1 tablet (15 mg total) by mouth at bedtime.   mometasone-formoterol 100-5 MCG/ACT Aero Commonly known as:  DULERA Inhale 2 puffs into the lungs 2 (two) times daily.   nitroGLYCERIN 0.4 MG SL tablet Commonly known as:  NITROSTAT Place 0.4 mg  under the tongue every 5 (five) minutes as needed for chest pain.   pantoprazole 40 MG tablet Commonly known as:  PROTONIX Take 40 mg by mouth daily.   polyethylene glycol packet Commonly known as:  MIRALAX / GLYCOLAX Take 17 g by mouth daily as needed for mild constipation.   sennosides-docusate sodium 8.6-50 MG tablet Commonly known as:  SENOKOT-S Take 2 tablets by mouth daily.   traZODone 50 MG tablet Commonly known as:  DESYREL Take 25 mg by mouth at bedtime. 1/2 tablet   Vitamin D3 50000 units Caps Take 1 capsule by mouth once a week. On saturdays       No orders of the defined types were placed in this encounter.   Immunization History  Administered Date(s) Administered  . Influenza Split 08/20/2014  . Influenza, High Dose Seasonal PF 10/06/2013  . PPD Test 11/16/2016, 12/05/2016  . Pneumococcal Polysaccharide-23 09/24/2009  Social History  Substance Use Topics  . Smoking status: Current Every Day Smoker    Packs/day: 1.00    Years: 25.00    Types: Cigarettes  . Smokeless tobacco: Never Used     Comment: over a ppd for 40+ years; quit 10/2004.  unsuccessfully tried eCigs.  . Alcohol use No    Review of Systems  DATA OBTAINED: from patient-limited; nursing - without concerns GENERAL:  no fevers, fatigue, appetite changes SKIN: No itching, rash HEENT: No complaint RESPIRATORY: No cough, wheezing, SOB CARDIAC: No chest pain, palpitations, lower extremity edema  GI: No abdominal pain, No N/V/D or constipation, No heartburn or reflux  GU: No dysuria, frequency or urgency, or incontinence  MUSCULOSKELETAL: No unrelieved bone/joint pain NEUROLOGIC: No headache, dizziness  PSYCHIATRIC: No overt anxiety or sadness  Vitals:   04/10/17 1253  BP: 134/78  Pulse: 80  Resp: 18  Temp: 97.1 F (36.2 C)   Body mass index is 23.8 kg/m. Physical Exam  GENERAL APPEARANCE: Alert, conversant, No acute distress  SKIN: No diaphoresis rash HEENT:  Unremarkable RESPIRATORY: Breathing is even, unlabored. Lung sounds are clear   CARDIOVASCULAR: Heart RRR no murmurs, rubs or gallops. No peripheral edema  GASTROINTESTINAL: Abdomen is soft, non-tender, not distended w/ normal bowel sounds.  GENITOURINARY: Bladder non tender, not distended  MUSCULOSKELETAL: No abnormal joints or musculature NEUROLOGIC: Cranial nerves 2-12 grossly intact. Moves all extremities PSYCHIATRIC: Mood and affect with dementia, no behavioral issues  Patient Active Problem List   Diagnosis Date Noted  . Dysphagia 05/10/2017  . Depression 03/14/2017  . Vitamin D deficiency 01/28/2017  . UTI due to extended-spectrum beta lactamase (ESBL) producing Escherichia coli 11/18/2016  . Urinary tract infection due to Proteus 11/18/2016  . Postoperative anemia due to acute blood loss 11/18/2016  . HTN (hypertension) 11/18/2016  . Chronic diastolic CHF (congestive heart failure) ([place]) 11/12/2016  . Closed right hip fracture ([place]) 11/10/2016  . 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 Obesity (BMI 30-39.9) 10/06/2015  . Tobacco use disorder 03/17/2015  . Cardiomyopathy, ischemic 03/17/2015  . Allergy to ACE inhibitors 03/17/2015  . Ventricular tachycardia ([place]) 03/17/2015  . HLD (hyperlipidemia) 12/31/2014  . COPD (chronic obstructive pulmonary disease) ([place]) 12/31/2014  . Chronic cough 09/12/2013  . Memory deficit 04/23/2013  . Essential hypertension, benign 04/03/2013  . Dyslipidemia 04/03/2013  . Coronary atherosclerosis of native coronary artery 04/03/2013  . DM2 (diabetes mellitus, type 2) ([place]) 04/02/2013  . Fibromyalgia 04/20/2008    CMP     Component Value Date/Time   NA 144 04/04/2017   K 3.4 04/04/2017   CL 103 11/16/2016 0522   CO2 31 11/16/2016 0522   GLUCOSE 220 (H) 11/16/2016 0522   BUN 17 04/04/2017   CREATININE 0.7 04/04/2017   CREATININE 0.77 11/16/2016 0522   CALCIUM 8.2 (L) 11/16/2016 0522   PROT 7.7 04/27/2016 1724    ALBUMIN 4.4 04/27/2016 1724   AST 14 12/22/2016   ALT 8 12/22/2016   ALKPHOS 79 12/22/2016   BILITOT 1.2 04/27/2016 1724   GFRNONAA >60 11/16/2016 0522   GFRAA >60 11/16/2016 0522    Recent Labs  11/11/16 0228 11/12/16 0633 11/16/16 0522 12/22/16 04/04/17  NA 137  137 137  137 138  138 140 144  K 4.6  4.6 3.8  3.8 3.6 4.0 3.4  CL 101 102 103  --   --   CO2 24 26 31   --   --   GLUCOSE 227* 216* 220*  --   --  BUN 17  17 12  12 11  11 13 17   CREATININE 0.89  0.9 0.73  0.7 0.77  0.8 0.6 0.7  CALCIUM 8.4* 8.1* 8.2*  --   --     Recent Labs  12/22/16  AST 14  ALT 8  ALKPHOS 79    Recent Labs  07/03/16 1831 11/10/16 0920  11/12/16 0633 11/13/16 0508 11/16/16 0522 12/22/16 04/04/17  WBC 10.4 13.0  13.0*  < > 11.3  11.3* 12.1  12.1* 5.9  8.9 9.5 11.5  NEUTROABS 9.0* 10.3*  --   --   --   --   --   --   HGB 13.4 13.8  13.8  < > 9.9*  9.9* 10.2*  10.2* 10.2* 11.7* 11.6*  HCT 39.1 41.0  41  < > 30.0*  30* 31.1*  31* 31.4* 35* 36  MCV 97.3 98.1  < > 97.7 100.6* 100.3*  --   --   PLT 162 198  198  < > 169  169 135*  135* 200 181 199  < > = values in this interval not displayed.  Recent Labs  12/22/16  CHOL 117  LDLCALC 59  TRIG 183*   No results found for: [facility] Lab Results  Component Value Date   TSH 3.76 12/22/2016   Lab Results  Component Value Date   HGBA1C 6.6 12/22/2016   Lab Results  Component Value Date   CHOL 117 12/22/2016   HDL 29 (A) 12/22/2016   LDLCALC 59 12/22/2016   TRIG 183 (A) 12/22/2016   CHOLHDL 2 12/19/2012    Significant Diagnostic Results in last 30 days:  Dg Op Swallowing Func-medicare/speech Path  Result Date: 05/09/2017 Objective Swallowing Evaluation: Type of Study: MBS-Modified Barium Swallow Study Patient Details Name: SHERMAN LIPUMA MRN: [id number] Date of Birth: 05-23-1941 Today's Date: 05/09/2017 Time: SLP Start Time (ACUTE ONLY): 1325-SLP Stop Time (ACUTE ONLY): 1400 SLP Time Calculation (min)  (ACUTE ONLY): 35 min Past Medical History: Past Medical History: Diagnosis Date . Anxiety  . CAD (coronary artery disease)   a. s/p inferior STEMI 12/29/10 with rotablator atherectomy RCA 12/31/10 and DES x 2 RCA;  b. Lexiscan Myoview (02/2015): mild reversible apical anterior perfusion defect. C. cath 02/2015 50% LAD lesion with patent stent, Tx Rx   . COPD (chronic obstructive pulmonary disease) ([place])  . Dementia  . Diabetes mellitus   type 2 . Diverticulosis  . GERD (gastroesophageal reflux disease)  . Glaucoma  . HTN (hypertension)  . Hyperlipidemia  . Ischemic cardiomyopathy   EF 45%cath 2012 EF55% 5/12; 35% 11/14 . Memory deficit  . Nephrolithiasis  . Osteoarthritis  . Overweight(278.02)  . Respiratory arrest//ACE Inhibitor presumed cause  . Sleep apnea  . Small bowel obstruction (HCC)   from a sigmoid stricture Past Surgical History: Past Surgical History: Procedure Laterality Date . bilateral tubal ligation   . CARDIAC CATHETERIZATION N/A 03/18/2015  Procedure: LEFT HEART CATH AND CORONARY ANGIOGRAPHY;  Surgeon: Lorretta Harp, MD;  Location: [facility] CATH LAB;  Service: Cardiovascular;  Laterality: N/A; . CARDIAC SURGERY   . HIP ARTHROPLASTY Right 11/10/2016  Procedure: ARTHROPLASTY BIPOLAR HIP (HEMIARTHROPLASTY);  Surgeon: Marchia Bond, MD;  Location: [place];  Service: Orthopedics;  Laterality: Right; . 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  07/31/2008 . LITHOTRIPSY   HPI: 76 yo female referred for OP MBS.  Pt resides at [facility].  Per referral form, concerns she may be aspirating is present as pt has  recurrent h/o pna, weight loss unintentional weigh loss, coughing during meals.  Pt admits to coughing some with food more than liquids, - but says "no more than those around me".  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  Pt denies requiring heimlich manuever.   Subjective: pt awake in chair Assessment / Plan / Recommendation CHL IP CLINICAL  IMPRESSIONS 05/09/2017 Clinical Impression Pt presents with minimal oropharyngeal dysphagia without aspiration or frank laryngeal penetration.  Pt did demonstrate premature spillage of barium into pharynx due to decreased oral control. Premature spillage and decreased timing of laryngeal closure allows intermittent penetration of liquids.  Pharyngeal swallow is strong and timely.  Difficulty transiting barium tablet with liquids, eventually requiring pudding to transit.  Of note, pt did not cough or choke during MBS, therefore can not determine source of symptoms.  Pt admits is she takes small bites/sips, she tolerates intake well without coughing. Recommend continue diet as tolerated and take medications with applesauce. SLP Visit Diagnosis Dysphagia, oropharyngeal phase (R13.12) Attention and concentration deficit following -- Frontal lobe and executive function deficit following -- Impact on safety and function Mild aspiration risk   CHL IP TREATMENT RECOMMENDATION 01/02/2016 Treatment Recommendations Therapy as outlined in treatment plan below   Prognosis 01/02/2016 Prognosis for Safe Diet Advancement (No Data) Barriers to Reach Goals Cognitive deficits Barriers/Prognosis Comment -- CHL IP DIET RECOMMENDATION 05/09/2017 SLP Diet Recommendations Dysphagia 3 (Mech soft) solids;Thin liquid Liquid Administration via Cup;Straw Medication Administration Whole meds with puree Compensations Slow rate;Small sips/bites Postural Changes Remain semi-upright after after feeds/meals (Comment);Seated upright at 90 degrees   CHL IP OTHER RECOMMENDATIONS 05/09/2017 Recommended Consults -- Oral Care Recommendations Oral care BID Other Recommendations --   CHL IP FOLLOW UP RECOMMENDATIONS 01/03/2016 Follow up Recommendations 24 hour supervision/assistance   CHL IP FREQUENCY AND DURATION 01/02/2016 Speech Therapy Frequency (ACUTE ONLY) min 2x/week Treatment Duration --      CHL IP ORAL PHASE 05/09/2017 Oral Phase Impaired Oral - Pudding  Teaspoon -- Oral - Pudding Cup -- Oral - Honey Teaspoon -- Oral - Honey Cup -- Oral - Nectar Teaspoon -- Oral - Nectar Cup WFL Oral - Nectar Straw -- Oral - Thin Teaspoon -- Oral - Thin Cup WFL Oral - Thin Straw WFL Oral - Puree WFL Oral - Mech Soft -- Oral - Regular WFL Oral - Multi-Consistency -- Oral - Pill Weak lingual manipulation;Delayed oral transit Oral Phase - Comment multiple attempts to clear tablet into pharynx with liquid not successful, use of pudding helpful  CHL IP PHARYNGEAL PHASE 05/09/2017 Pharyngeal Phase WFL Pharyngeal- Pudding Teaspoon -- Pharyngeal -- Pharyngeal- Pudding Cup -- Pharyngeal -- Pharyngeal- Honey Teaspoon -- Pharyngeal -- Pharyngeal- Honey Cup -- Pharyngeal -- Pharyngeal- Nectar Teaspoon -- Pharyngeal -- Pharyngeal- Nectar Cup WFL Pharyngeal -- Pharyngeal- Nectar Straw -- Pharyngeal -- Pharyngeal- Thin Teaspoon WFL Pharyngeal -- Pharyngeal- Thin Cup [facility] Pharyngeal -- Pharyngeal- Thin Straw -- Pharyngeal -- Pharyngeal- Puree WFL Pharyngeal -- Pharyngeal- Mechanical Soft -- Pharyngeal -- Pharyngeal- Regular WFL Pharyngeal -- Pharyngeal- Multi-consistency -- Pharyngeal -- Pharyngeal- Pill WFL Pharyngeal -- Pharyngeal Comment --  CHL IP CERVICAL ESOPHAGEAL PHASE 05/09/2017 Cervical Esophageal Phase WFL Pudding Teaspoon -- Pudding Cup -- Honey Teaspoon -- Honey Cup -- Nectar Teaspoon -- Nectar Cup -- Nectar Straw -- Thin Teaspoon -- Thin Cup -- Thin Straw -- Puree -- Mechanical Soft -- Regular -- Multi-consistency -- Pill -- Cervical Esophageal Comment -- CHL IP GO 05/09/2017 Functional Assessment  Tool Used mbs, clinical judgement Functional Limitations Swallowing Swallow Current Status (O1308) CI Swallow Goal Status (M5784) CI Swallow Discharge Status [phone]) CI Macario Golds 05/09/2017, 2:55 PM  Luanna Salk, MS [facility] SLP [phone]   CLINICAL DATA:  chronic cough.  Prior stroke. EXAM: MODIFIED BARIUM SWALLOW TECHNIQUE: Different consistencies of barium were administered orally to  the patient by the Speech Pathologist. Imaging of the pharynx was performed in the lateral projection. FLUOROSCOPY TIME:  Fluoroscopy Time:  1 minutes and 0 seconds. Radiation Exposure Index (if provided by the fluoroscopic device): 3.7 mGy Number of Acquired Spot Images: 0 COMPARISON:  11/10/2016 chest radiograph. FINDINGS: Thin liquid- flash penetration with multiple swallows. Normal when swallowed from a straw. Nectar thick liquid- within normal limits Pure- within normal limits Pure with cracker- within normal limits Barium tablet -  within normal limits IMPRESSION: Minimal swallow dysfunction, as detailed above. Please refer to the Speech Pathologists report for complete details and recommendations. Electronically Signed   By: Abigail Miyamoto M.D.   On: 05/09/2017 14:32    Assessment and Plan  Vitamin D deficiency [id number]  Chronic diastolic CHF (congestive heart failure) (HCC) Chronic and stable without exacerbation; continue Lasix 20 mg by mouth daily and Toprol-XL 12.5 mg by mouth daily  Coronary atherosclerosis of native coronary artery Chronic and stable without episodes of chest pain; plan to continue Toprol-XL 12.5 mg daily, Imdur 15 milligrams by mouth daily, ASA 81 mg by mouth daily, Plavix 75 mg daily, when necessary nitroglycerin sublingual and Lipitor 40 mg daily    Cledith Abdou D. [name], MD

## 2017-04-26 DIAGNOSIS — R488 Other symbolic dysfunctions: Secondary | ICD-10-CM | POA: Diagnosis not present

## 2017-04-26 DIAGNOSIS — Z993 Dependence on wheelchair: Secondary | ICD-10-CM | POA: Diagnosis not present

## 2017-04-26 DIAGNOSIS — M6281 Muscle weakness (generalized): Secondary | ICD-10-CM | POA: Diagnosis not present

## 2017-04-26 DIAGNOSIS — K219 Gastro-esophageal reflux disease without esophagitis: Secondary | ICD-10-CM | POA: Diagnosis not present

## 2017-04-26 DIAGNOSIS — R1312 Dysphagia, oropharyngeal phase: Secondary | ICD-10-CM | POA: Diagnosis not present

## 2017-04-26 DIAGNOSIS — F0390 Unspecified dementia without behavioral disturbance: Secondary | ICD-10-CM | POA: Diagnosis not present

## 2017-04-30 ENCOUNTER — Encounter: Payer: Self-pay | Admitting: Internal Medicine

## 2017-04-30 DIAGNOSIS — E559 Vitamin D deficiency, unspecified: Secondary | ICD-10-CM | POA: Diagnosis not present

## 2017-04-30 DIAGNOSIS — R488 Other symbolic dysfunctions: Secondary | ICD-10-CM | POA: Diagnosis not present

## 2017-04-30 LAB — VITAMIN D 25 HYDROXY (VIT D DEFICIENCY, FRACTURES): Vit D, 25-Hydroxy: 22.01

## 2017-05-03 ENCOUNTER — Other Ambulatory Visit (HOSPITAL_COMMUNITY): Payer: Self-pay | Admitting: Internal Medicine

## 2017-05-03 DIAGNOSIS — R1319 Other dysphagia: Secondary | ICD-10-CM

## 2017-05-07 DIAGNOSIS — F331 Major depressive disorder, recurrent, moderate: Secondary | ICD-10-CM | POA: Diagnosis not present

## 2017-05-07 DIAGNOSIS — F0390 Unspecified dementia without behavioral disturbance: Secondary | ICD-10-CM | POA: Diagnosis not present

## 2017-05-07 DIAGNOSIS — F419 Anxiety disorder, unspecified: Secondary | ICD-10-CM | POA: Diagnosis not present

## 2017-05-08 ENCOUNTER — Ambulatory Visit (HOSPITAL_COMMUNITY): Payer: Medicare Other

## 2017-05-09 ENCOUNTER — Encounter: Payer: Self-pay | Admitting: Internal Medicine

## 2017-05-09 ENCOUNTER — Non-Acute Institutional Stay (SKILLED_NURSING_FACILITY): Payer: Medicare Other | Admitting: Internal Medicine

## 2017-05-09 ENCOUNTER — Ambulatory Visit (HOSPITAL_COMMUNITY)
Admission: RE | Admit: 2017-05-09 | Discharge: 2017-05-09 | Disposition: A | Discharge status: Home / Self Care | Payer: Medicare Other | Source: Ambulatory Visit | Attending: Internal Medicine | Admitting: Internal Medicine

## 2017-05-09 DIAGNOSIS — R1319 Other dysphagia: Secondary | ICD-10-CM

## 2017-05-09 DIAGNOSIS — F0390 Unspecified dementia without behavioral disturbance: Secondary | ICD-10-CM

## 2017-05-09 DIAGNOSIS — E119 Type 2 diabetes mellitus without complications: Secondary | ICD-10-CM

## 2017-05-09 DIAGNOSIS — R05 Cough: Secondary | ICD-10-CM | POA: Diagnosis not present

## 2017-05-09 DIAGNOSIS — J449 Chronic obstructive pulmonary disease, unspecified: Secondary | ICD-10-CM | POA: Diagnosis not present

## 2017-05-09 DIAGNOSIS — R1312 Dysphagia, oropharyngeal phase: Secondary | ICD-10-CM | POA: Diagnosis not present

## 2017-05-09 NOTE — Progress Notes (Signed)
Location:  [place] Room Number: 212D Place of Service:  SNF [phone])  [name]. [name], MD  Patient Care Team: Harlan Stains, MD as PCP - General [facility])  Extended Emergency Contact Information Primary Emergency Contact: [name],[name] Address: [address]          [place], [place] [address] [name] of [place] Phone: [phone] Work Phone: [phone] Mobile Phone: [phone] Relation: Daughter Secondary Emergency Contact: [name],[name]  [place] Phone: [phone] Relation: Niece    Allergies: Lisinopril; Spironolactone; Vancomycin; Donepezil; Eggs or egg-derived products; Erythromycin; Macrolides and ketolides; Penicillins; Quinapril hcl; Angiotensin receptor blockers; Lipitor [atorvastatin]; Pentazocine lactate; Propoxyphene hcl; and Sulfonamide derivatives  Chief Complaint  Patient presents with  . Medical Management of Chronic Issues    Routine Visit    HPI: Patient is 76 y.o. female who Is being seen for routine issues of COPD, DM 2, and dementia.  Past Medical History:  Diagnosis Date  . Anxiety   . CAD (coronary artery disease)    a. s/p inferior STEMI 12/29/10 with rotablator atherectomy RCA 12/31/10 and DES x 2 RCA;  b. Lexiscan Myoview (02/2015): mild reversible apical anterior perfusion defect. C. cath 02/2015 50% LAD lesion with patent stent, Tx Rx    . COPD (chronic obstructive pulmonary disease) ([place])   . Dementia   . Diabetes mellitus    type 2  . Diverticulosis   . GERD (gastroesophageal reflux disease)   . Glaucoma   . HTN (hypertension)   . Hyperlipidemia   . Ischemic cardiomyopathy    EF 45%cath 2012 EF55% 5/12; 35% 11/14  . Memory deficit   . Nephrolithiasis   . Osteoarthritis   . Overweight(278.02)   . Respiratory arrest//ACE Inhibitor presumed cause   . Sleep apnea   . Small bowel obstruction (HCC)    from a sigmoid stricture    Past Surgical History:  Procedure  Laterality Date  . bilateral tubal ligation    . CARDIAC CATHETERIZATION N/A 03/18/2015   Procedure: LEFT HEART CATH AND CORONARY ANGIOGRAPHY;  Surgeon: Lorretta Harp, MD;  Location: [facility] CATH LAB;  Service: Cardiovascular;  Laterality: N/A;  . CARDIAC SURGERY    . HIP ARTHROPLASTY Right 11/10/2016   Procedure: ARTHROPLASTY BIPOLAR HIP (HEMIARTHROPLASTY);  Surgeon: Marchia Bond, MD;  Location: [place];  Service: Orthopedics;  Laterality: Right;  . 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 LITHOTRIPSY      Allergies as of 05/09/2017      Reactions   Lisinopril Other (See Comments)   Reaction:  Respiratory arrest    Spironolactone Anaphylaxis   Vancomycin Anaphylaxis, Rash   Donepezil Diarrhea   Eggs Or Egg-derived Products Diarrhea   Erythromycin Other (See Comments)   Reaction:  Makes pt feel weird    Macrolides And Ketolides Other (See Comments)   Reaction:  Unknown    Penicillins Itching, Other (See Comments)   Has patient had a PCN reaction causing immediate rash, facial/tongue/throat swelling, SOB or lightheadedness with hypotension: No Has patient had a PCN reaction causing severe rash involving mucus membranes or skin necrosis: No Has patient had a PCN reaction that required hospitalization No Has patient had a PCN reaction occurring within the last 10 years: No If all of the above answers are "NO", then may proceed with Cephalosporin use.   Quinapril Hcl Itching   Angiotensin Receptor Blockers Other (See Comments)   Reaction:  Unknown  Lipitor [atorvastatin] Palpitations   Pentazocine Lactate Other (See Comments)   Reaction:  Unknown    Propoxyphene Hcl Other (See Comments)   Reaction unknown   Sulfonamide Derivatives Other (See Comments)   Reaction:  Unknown       Medication List       Accurate as of 05/09/17 11:59 PM. Always use your most recent med list.          acetaminophen 325 MG tablet Commonly known as:  TYLENOL Take 2  tablets (650 mg total) by mouth every 6 (six) hours as needed.   albuterol 108 (90 Base) MCG/ACT inhaler Commonly known as:  PROVENTIL HFA;VENTOLIN HFA Inhale 2 puffs into the lungs every 6 (six) hours as needed for wheezing or shortness of breath.   aspirin EC 81 MG tablet Take 81 mg by mouth daily.   atorvastatin 40 MG tablet Commonly known as:  LIPITOR Take 40 mg by mouth daily.   clopidogrel 75 MG tablet Commonly known as:  PLAVIX Take 75 mg by mouth daily.   feeding supplement (GLUCERNA SHAKE) Liqd Take 237 mLs by mouth 2 (two) times daily between meals.   furosemide 20 MG tablet Commonly known as:  LASIX Take 20 mg by mouth daily.   guaiFENesin 600 MG 12 hr tablet Commonly known as:  MUCINEX Take 600 mg by mouth 2 (two) times daily.   HUMALOG KWIKPEN 100 UNIT/ML KiwkPen Generic drug:  insulin lispro Inject 4 Units into the skin 3 (three) times daily with meals.   HYDROcodone-acetaminophen 5-325 MG tablet Commonly known as:  NORCO/VICODIN Take 1 tablet by mouth every 6 (six) hours as needed for moderate pain.   insulin glargine 100 unit/mL Sopn Commonly known as:  LANTUS Inject 0.15 mLs (15 Units total) into the skin at bedtime.   isosorbide mononitrate 30 MG 24 hr tablet Commonly known as:  IMDUR Take 0.5 tablets (15 mg total) by mouth daily.   memantine 10 MG tablet Commonly known as:  NAMENDA Take 10 mg by mouth 2 (two) times daily.   metFORMIN 1000 MG tablet Commonly known as:  GLUCOPHAGE Take 1,000 mg by mouth 2 (two) times daily with a meal.   metoprolol succinate 25 MG 24 hr tablet Commonly known as:  TOPROL XL Take 0.5 tablets (12.5 mg total) by mouth daily.   mirtazapine 15 MG disintegrating tablet Commonly known as:  REMERON SOL-TAB Take 1 tablet (15 mg total) by mouth at bedtime.   mometasone-formoterol 100-5 MCG/ACT Aero Commonly known as:  DULERA Inhale 2 puffs into the lungs 2 (two) times daily.   nitroGLYCERIN 0.4 MG SL  tablet Commonly known as:  NITROSTAT Place 0.4 mg under the tongue every 5 (five) minutes as needed for chest pain.   pantoprazole 40 MG tablet Commonly known as:  PROTONIX Take 40 mg by mouth daily.   polyethylene glycol packet Commonly known as:  MIRALAX / GLYCOLAX Take 17 g by mouth daily as needed for mild constipation.   sennosides-docusate sodium 8.6-50 MG tablet Commonly known as:  SENOKOT-S Take 2 tablets by mouth daily.   traZODone 50 MG tablet Commonly known as:  DESYREL Take 25 mg by mouth at bedtime. 1/2 tablet       Meds ordered this encounter  Medications  . guaiFENesin (MUCINEX) 600 MG 12 hr tablet    Sig: Take 600 mg by mouth 2 (two) times daily.    Immunization History  Administered Date(s) Administered  . Influenza Split 08/20/2014  . Influenza, High  Dose Seasonal PF 10/06/2013  . PPD Test 11/16/2016, 12/05/2016  . Pneumococcal Polysaccharide-23 09/24/2009    Social History  Substance Use Topics  . Smoking status: Current Every Day Smoker    Packs/day: 1.00    Years: 25.00    Types: Cigarettes  . Smokeless tobacco: Never Used     Comment: over a ppd for 40+ years; quit 10/2004.  unsuccessfully tried eCigs.  . Alcohol use No    Review of Systems  DATA OBTAINED: from patient-limited participation, nurse-no new concerns GENERAL:  no fevers, fatigue, appetite changes SKIN: No itching, rash HEENT: No complaint RESPIRATORY: No cough, wheezing, SOB CARDIAC: No chest pain, palpitations, lower extremity edema  GI: No abdominal pain, No N/V/D or constipation, No heartburn or reflux  GU: No dysuria, frequency or urgency, or incontinence  MUSCULOSKELETAL: No unrelieved bone/joint pain NEUROLOGIC: No headache, dizziness  PSYCHIATRIC: No overt anxiety or sadness  Vitals:   05/09/17 0924  BP: 130/74  Pulse: 91  Resp: 18  Temp: 97.9 F (36.6 C)   Body mass index is 23.3 kg/m. Physical Exam  GENERAL APPEARANCE: Alert, conversant, No acute  distress;White female in wheelchair he stays near the nursing desk all the time  SKIN: No diaphoresis rash HEENT: Unremarkable RESPIRATORY: Breathing is even, unlabored. Lung sounds are clear   CARDIOVASCULAR: Heart RRR no murmurs, rubs or gallops. No peripheral edema  GASTROINTESTINAL: Abdomen is soft, non-tender, not distended w/ normal bowel sounds.  GENITOURINARY: Bladder non tender, not distended  MUSCULOSKELETAL: No abnormal joints or musculature NEUROLOGIC: Cranial nerves 2-12 grossly intact. Moves all extremities PSYCHIATRIC: Mood and affect With dementia, no behavioral issues  Patient Active Problem List   Diagnosis Date Noted  . Dysphagia 05/10/2017  . Depression 03/14/2017  . Vitamin D deficiency 01/28/2017  . UTI due to extended-spectrum beta lactamase (ESBL) producing Escherichia coli 11/18/2016  . Urinary tract infection due to Proteus 11/18/2016  . Postoperative anemia due to acute blood loss 11/18/2016  . HTN (hypertension) 11/18/2016  . Chronic diastolic CHF (congestive heart failure) ([place]) 11/12/2016  . Closed right hip fracture ([place]) 11/10/2016  . 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 Obesity (BMI 30-39.9) 10/06/2015  . Tobacco use disorder 03/17/2015  . Cardiomyopathy, ischemic 03/17/2015  . Allergy to ACE inhibitors 03/17/2015  . Ventricular tachycardia ([place]) 03/17/2015  . HLD (hyperlipidemia) 12/31/2014  . COPD (chronic obstructive pulmonary disease) ([place]) 12/31/2014  . Chronic cough 09/12/2013  . Memory deficit 04/23/2013  . Essential hypertension, benign 04/03/2013  . Dyslipidemia 04/03/2013  . Coronary atherosclerosis of native coronary artery 04/03/2013  . DM2 (diabetes mellitus, type 2) ([place]) 04/02/2013  . Fibromyalgia 04/20/2008    CMP     Component Value Date/Time   NA 144 04/04/2017   K 3.4 04/04/2017   CL 103 11/16/2016 0522   CO2 31 11/16/2016 0522   GLUCOSE 220 (H) 11/16/2016 0522   BUN 17 04/04/2017   CREATININE  0.7 04/04/2017   CREATININE 0.77 11/16/2016 0522   CALCIUM 8.2 (L) 11/16/2016 0522   PROT 7.7 04/27/2016 1724   ALBUMIN 4.4 04/27/2016 1724   AST 14 12/22/2016   ALT 8 12/22/2016   ALKPHOS 79 12/22/2016   BILITOT 1.2 04/27/2016 1724   GFRNONAA >60 11/16/2016 0522   GFRAA >60 11/16/2016 0522    Recent Labs  11/11/16 0228 11/12/16 0633 11/16/16 0522 12/22/16 04/04/17  NA 137  137 137  137 138  138 140 144  K 4.6  4.6 3.8  3.8 3.6 4.0  3.4  CL 101 102 103  --   --   CO2 24 26 31   --   --   GLUCOSE 227* 216* 220*  --   --   BUN 17  17 12  12 11  11 13 17   CREATININE 0.89  0.9 0.73  0.7 0.77  0.8 0.6 0.7  CALCIUM 8.4* 8.1* 8.2*  --   --     Recent Labs  12/22/16  AST 14  ALT 8  ALKPHOS 79    Recent Labs  07/03/16 1831 11/10/16 0920  11/12/16 0633 11/13/16 0508 11/16/16 0522 12/22/16 04/04/17  WBC 10.4 13.0  13.0*  < > 11.3  11.3* 12.1  12.1* 5.9  8.9 9.5 11.5  NEUTROABS 9.0* 10.3*  --   --   --   --   --   --   HGB 13.4 13.8  13.8  < > 9.9*  9.9* 10.2*  10.2* 10.2* 11.7* 11.6*  HCT 39.1 41.0  41  < > 30.0*  30* 31.1*  31* 31.4* 35* 36  MCV 97.3 98.1  < > 97.7 100.6* 100.3*  --   --   PLT 162 198  198  < > 169  169 135*  135* 200 181 199  < > = values in this interval not displayed.  Recent Labs  12/22/16  CHOL 117  LDLCALC 59  TRIG 183*   No results found for: [facility] Lab Results  Component Value Date   TSH 3.76 12/22/2016   Lab Results  Component Value Date   HGBA1C 6.6 12/22/2016   Lab Results  Component Value Date   CHOL 117 12/22/2016   HDL 29 (A) 12/22/2016   LDLCALC 59 12/22/2016   TRIG 183 (A) 12/22/2016   CHOLHDL 2 12/19/2012    Significant Diagnostic Results in last 30 days:  Dg Op Swallowing Func-medicare/speech Path  Result Date: 05/09/2017 Objective Swallowing Evaluation: Type of Study: MBS-Modified Barium Swallow Study Patient Details Name: SOLANA COGGIN MRN: [id number] Date of Birth: 09-20-1941 Today's  Date: 05/09/2017 Time: SLP Start Time (ACUTE ONLY): 1325-SLP Stop Time (ACUTE ONLY): 1400 SLP Time Calculation (min) (ACUTE ONLY): 35 min Past Medical History: Past Medical History: Diagnosis Date . Anxiety  . CAD (coronary artery disease)   a. s/p inferior STEMI 12/29/10 with rotablator atherectomy RCA 12/31/10 and DES x 2 RCA;  b. Lexiscan Myoview (02/2015): mild reversible apical anterior perfusion defect. C. cath 02/2015 50% LAD lesion with patent stent, Tx Rx   . COPD (chronic obstructive pulmonary disease) ([place])  . Dementia  . Diabetes mellitus   type 2 . Diverticulosis  . GERD (gastroesophageal reflux disease)  . Glaucoma  . HTN (hypertension)  . Hyperlipidemia  . Ischemic cardiomyopathy   EF 45%cath 2012 EF55% 5/12; 35% 11/14 . Memory deficit  . Nephrolithiasis  . Osteoarthritis  . Overweight(278.02)  . Respiratory arrest//ACE Inhibitor presumed cause  . Sleep apnea  . Small bowel obstruction (HCC)   from a sigmoid stricture Past Surgical History: Past Surgical History: Procedure Laterality Date . bilateral tubal ligation   . CARDIAC CATHETERIZATION N/A 03/18/2015  Procedure: LEFT HEART CATH AND CORONARY ANGIOGRAPHY;  Surgeon: Lorretta Harp, MD;  Location: [facility] CATH LAB;  Service: Cardiovascular;  Laterality: N/A; . CARDIAC SURGERY   . HIP ARTHROPLASTY Right 11/10/2016  Procedure: ARTHROPLASTY BIPOLAR HIP (HEMIARTHROPLASTY);  Surgeon: Marchia Bond, MD;  Location: [place];  Service: Orthopedics;  Laterality: Right; . 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  07/31/2008 . LITHOTRIPSY   HPI: 76 yo female referred for OP MBS.  Pt resides at [facility].  Per referral form, concerns she may be aspirating is present as pt has recurrent h/o pna, weight loss unintentional weigh loss, coughing during meals.  Pt admits to coughing some with food more than liquids, - but says "no more than those around me".  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  Pt  denies requiring heimlich manuever.   Subjective: pt awake in chair Assessment / Plan / Recommendation CHL IP CLINICAL IMPRESSIONS 05/09/2017 Clinical Impression Pt presents with minimal oropharyngeal dysphagia without aspiration or frank laryngeal penetration.  Pt did demonstrate premature spillage of barium into pharynx due to decreased oral control. Premature spillage and decreased timing of laryngeal closure allows intermittent penetration of liquids.  Pharyngeal swallow is strong and timely.  Difficulty transiting barium tablet with liquids, eventually requiring pudding to transit.  Of note, pt did not cough or choke during MBS, therefore can not determine source of symptoms.  Pt admits is she takes small bites/sips, she tolerates intake well without coughing. Recommend continue diet as tolerated and take medications with applesauce. SLP Visit Diagnosis Dysphagia, oropharyngeal phase (R13.12) Attention and concentration deficit following -- Frontal lobe and executive function deficit following -- Impact on safety and function Mild aspiration risk   CHL IP TREATMENT RECOMMENDATION 01/02/2016 Treatment Recommendations Therapy as outlined in treatment plan below   Prognosis 01/02/2016 Prognosis for Safe Diet Advancement (No Data) Barriers to Reach Goals Cognitive deficits Barriers/Prognosis Comment -- CHL IP DIET RECOMMENDATION 05/09/2017 SLP Diet Recommendations Dysphagia 3 (Mech soft) solids;Thin liquid Liquid Administration via Cup;Straw Medication Administration Whole meds with puree Compensations Slow rate;Small sips/bites Postural Changes Remain semi-upright after after feeds/meals (Comment);Seated upright at 90 degrees   CHL IP OTHER RECOMMENDATIONS 05/09/2017 Recommended Consults -- Oral Care Recommendations Oral care BID Other Recommendations --   CHL IP FOLLOW UP RECOMMENDATIONS 01/03/2016 Follow up Recommendations 24 hour supervision/assistance   CHL IP FREQUENCY AND DURATION 01/02/2016 Speech Therapy  Frequency (ACUTE ONLY) min 2x/week Treatment Duration --      CHL IP ORAL PHASE 05/09/2017 Oral Phase Impaired Oral - Pudding Teaspoon -- Oral - Pudding Cup -- Oral - Honey Teaspoon -- Oral - Honey Cup -- Oral - Nectar Teaspoon -- Oral - Nectar Cup WFL Oral - Nectar Straw -- Oral - Thin Teaspoon -- Oral - Thin Cup WFL Oral - Thin Straw WFL Oral - Puree WFL Oral - Mech Soft -- Oral - Regular WFL Oral - Multi-Consistency -- Oral - Pill Weak lingual manipulation;Delayed oral transit Oral Phase - Comment multiple attempts to clear tablet into pharynx with liquid not successful, use of pudding helpful  CHL IP PHARYNGEAL PHASE 05/09/2017 Pharyngeal Phase WFL Pharyngeal- Pudding Teaspoon -- Pharyngeal -- Pharyngeal- Pudding Cup -- Pharyngeal -- Pharyngeal- Honey Teaspoon -- Pharyngeal -- Pharyngeal- Honey Cup -- Pharyngeal -- Pharyngeal- Nectar Teaspoon -- Pharyngeal -- Pharyngeal- Nectar Cup WFL Pharyngeal -- Pharyngeal- Nectar Straw -- Pharyngeal -- Pharyngeal- Thin Teaspoon WFL Pharyngeal -- Pharyngeal- Thin Cup [facility] Pharyngeal -- Pharyngeal- Thin Straw -- Pharyngeal -- Pharyngeal- Puree WFL Pharyngeal -- Pharyngeal- Mechanical Soft -- Pharyngeal -- Pharyngeal- Regular WFL Pharyngeal -- Pharyngeal- Multi-consistency -- Pharyngeal -- Pharyngeal- Pill WFL Pharyngeal -- Pharyngeal Comment --  CHL IP CERVICAL ESOPHAGEAL PHASE 05/09/2017 Cervical Esophageal Phase WFL Pudding Teaspoon -- Pudding Cup -- Honey Teaspoon -- Honey Cup -- Nectar Teaspoon -- Nectar Cup --  Nectar Straw -- Thin Teaspoon -- Thin Cup -- Thin Straw -- Puree -- Mechanical Soft -- Regular -- Multi-consistency -- Pill -- Cervical Esophageal Comment -- CHL IP GO 05/09/2017 Functional Assessment Tool Used mbs, clinical judgement Functional Limitations Swallowing Swallow Current Status (H4035) CI Swallow Goal Status (C4818) CI Swallow Discharge Status (H9093) CI Macario Golds 05/09/2017, 2:55 PM  Luanna Salk, MS [facility] SLP [phone]   CLINICAL DATA:  chronic  cough.  Prior stroke. EXAM: MODIFIED BARIUM SWALLOW TECHNIQUE: Different consistencies of barium were administered orally to the patient by the Speech Pathologist. Imaging of the pharynx was performed in the lateral projection. FLUOROSCOPY TIME:  Fluoroscopy Time:  1 minutes and 0 seconds. Radiation Exposure Index (if provided by the fluoroscopic device): 3.7 mGy Number of Acquired Spot Images: 0 COMPARISON:  11/10/2016 chest radiograph. FINDINGS: Thin liquid- flash penetration with multiple swallows. Normal when swallowed from a straw. Nectar thick liquid- within normal limits Pure- within normal limits Pure with cracker- within normal limits Barium tablet -  within normal limits IMPRESSION: Minimal swallow dysfunction, as detailed above. Please refer to the Speech Pathologists report for complete details and recommendations. Electronically Signed   By: Abigail Miyamoto M.D.   On: 05/09/2017 14:32    Assessment and Plan  COPD (chronic obstructive pulmonary disease) ([place]) No reported exacerbations; continue gentle I1 100-5 2 puffs daily, Mucinex 600 mg twice a day daily and when necessary albuterol.  Diabetes mellitus without complication ([place]) Most recent hemoglobin A1c is 6.6, which is good control; plan to continue current regimen which is; Lantus 15 units nightly, NovoLog 4 units with each meal, and Glucophage 1000 mg by mouth twice a day; patient is on a statin, patient has an allergy to ACE  Senile dementia without behavioral disturbance Stable and chronic; plan to continue Namenda 10 mg by mouth twice a day    Webb Silversmith D. [name], MD

## 2017-05-10 DIAGNOSIS — R1310 Dysphagia, unspecified: Secondary | ICD-10-CM | POA: Insufficient documentation

## 2017-05-10 HISTORY — DX: Dysphagia, unspecified: R13.10

## 2017-05-20 DIAGNOSIS — M6281 Muscle weakness (generalized): Secondary | ICD-10-CM | POA: Diagnosis not present

## 2017-05-20 DIAGNOSIS — K219 Gastro-esophageal reflux disease without esophagitis: Secondary | ICD-10-CM | POA: Diagnosis not present

## 2017-05-31 ENCOUNTER — Encounter: Payer: Self-pay | Admitting: Internal Medicine

## 2017-05-31 NOTE — Assessment & Plan Note (Signed)
Chronic and stable without episodes of chest pain; plan to continue Toprol-XL 12.5 mg daily, Imdur 15 milligrams by mouth daily, ASA 81 mg by mouth daily, Plavix 75 mg daily, when necessary nitroglycerin sublingual and Lipitor 40 mg daily

## 2017-05-31 NOTE — Assessment & Plan Note (Signed)
Chronic and stable without exacerbation; continue Lasix 20 mg by mouth daily and Toprol-XL 12.5 mg by mouth daily

## 2017-05-31 NOTE — Assessment & Plan Note (Signed)
RecentvitaminDlevelwas22;plantocontinue50,000unitsweekly

## 2017-06-07 ENCOUNTER — Encounter: Payer: Self-pay | Admitting: Internal Medicine

## 2017-06-07 NOTE — Assessment & Plan Note (Signed)
Most recent hemoglobin A1c is 6.6, which is good control; plan to continue current regimen which is; Lantus 15 units nightly, NovoLog 4 units with each meal, and Glucophage 1000 mg by mouth twice a day; patient is on a statin, patient has an allergy to ACE

## 2017-06-07 NOTE — Assessment & Plan Note (Signed)
Stable and chronic; plan to continue Namenda 10 mg by mouth twice a day

## 2017-06-07 NOTE — Assessment & Plan Note (Signed)
No reported exacerbations; continue gentle I1 100-5 2 puffs daily, Mucinex 600 mg twice a day daily and when necessary albuterol.

## 2017-06-11 ENCOUNTER — Encounter: Payer: Self-pay | Admitting: Internal Medicine

## 2017-06-11 ENCOUNTER — Non-Acute Institutional Stay (SKILLED_NURSING_FACILITY): Payer: Medicare Other | Admitting: Internal Medicine

## 2017-06-11 DIAGNOSIS — E785 Hyperlipidemia, unspecified: Secondary | ICD-10-CM

## 2017-06-11 DIAGNOSIS — I10 Essential (primary) hypertension: Secondary | ICD-10-CM

## 2017-06-11 DIAGNOSIS — F329 Major depressive disorder, single episode, unspecified: Secondary | ICD-10-CM

## 2017-06-11 DIAGNOSIS — F32A Depression, unspecified: Secondary | ICD-10-CM

## 2017-06-11 NOTE — Progress Notes (Signed)
Location:  [place] Room Number: 212D Place of Service:  SNF [id number]) Provider: Noah Delaine. [name], MD Harlan Stains, MD  Patient Care Team: Harlan Stains, MD as PCP - [facility])  Extended Emergency Contact Information Primary Emergency Contact: [name],[name] Address: [address]          [place], [place] [address] [name] of [place] Phone: [phone] Work Phone: [phone] Mobile Phone: [phone] Relation: Daughter Secondary Emergency Contact: [name],[name]  [place] Phone: [phone] Relation: Niece    Allergies: Lisinopril; Spironolactone; Vancomycin; Donepezil; Eggs or egg-derived products; Erythromycin; Macrolides and ketolides; Penicillins; Quinapril hcl; Angiotensin receptor blockers; Lipitor [atorvastatin]; Pentazocine lactate; Propoxyphene hcl; and Sulfonamide derivatives  Chief Complaint  Patient presents with  . Medical Management of Chronic Issues    routine visit    HPI: Patient is 76 y.o. female who Is being seen for routine issues of hypertension, depression, and hyperlipidemia.  Past Medical History:  Diagnosis Date  . Allergy to ACE inhibitors 03/17/2015  . Anxiety   . CAD (coronary artery disease)    a. s/p inferior STEMI 12/29/10 with rotablator atherectomy RCA 12/31/10 and DES x 2 RCA;  b. Lexiscan Myoview (02/2015): mild reversible apical anterior perfusion defect. C. cath 02/2015 50% LAD lesion with patent stent, Tx Rx    . Cardiomyopathy, ischemic 03/17/2015  . Chronic diastolic CHF (congestive heart failure) ([place]) 11/12/2016  . COPD (chronic obstructive pulmonary disease) ([place])   . Coronary atherosclerosis of native coronary artery 04/03/2013  . Dementia   . Depression 03/14/2017  . Diabetes mellitus    type 2  . Diverticulosis   . DM2 (diabetes mellitus, type 2) ([place]) 04/02/2013  . Dysphagia 05/10/2017   Minimal 6/20 - pureed diet  . Fibromyalgia 04/20/2008   Qualifier: Diagnosis of  By: Nelson-Smith CMA (AAMA), Dottie    . GERD (gastroesophageal reflux disease)   . Glaucoma   . HTN (hypertension)   . Hyperlipidemia   . Ischemic cardiomyopathy    EF 45%cath 2012 EF55% 5/12; 35% 11/14  . Memory deficit   . Nephrolithiasis   . Obesity (BMI 30-39.9) 10/06/2015  . Osteoarthritis   . Overweight(278.02)   . Respiratory arrest//ACE Inhibitor presumed cause   . 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 Sleep apnea   . Small bowel obstruction (HCC)    from a sigmoid stricture  . Ventricular tachycardia ([place]) 03/17/2015  . Vitamin D deficiency 01/28/2017    Past Surgical History:  Procedure Laterality Date  . bilateral tubal ligation    . CARDIAC CATHETERIZATION N/A 03/18/2015   Procedure: LEFT HEART CATH AND CORONARY ANGIOGRAPHY;  Surgeon: Lorretta Harp, MD;  Location: [facility] CATH LAB;  Service: Cardiovascular;  Laterality: N/A;  . CARDIAC SURGERY    . HIP ARTHROPLASTY Right 11/10/2016   Procedure: ARTHROPLASTY BIPOLAR HIP (HEMIARTHROPLASTY);  Surgeon: Marchia Bond, MD;  Location: [facility];  Service: Orthopedics;  Laterality: Right;  . 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 LITHOTRIPSY      Allergies as of 06/11/2017      Reactions   Lisinopril Other (See Comments)   Reaction:  Respiratory arrest    Spironolactone Anaphylaxis   Vancomycin Anaphylaxis, Rash   Donepezil Diarrhea   Eggs Or Egg-derived Products Diarrhea   Erythromycin Other (See Comments)   Reaction:  Makes pt feel weird    Macrolides And Ketolides Other (See Comments)   Reaction:  Unknown  Penicillins Itching, Other (See Comments)   Has patient had a PCN reaction causing immediate rash, facial/tongue/throat swelling, SOB or lightheadedness with hypotension: No Has patient had a PCN reaction causing severe rash involving mucus membranes or skin necrosis: No Has patient had a PCN reaction that required hospitalization No Has  patient had a PCN reaction occurring within the last 10 years: No If all of the above answers are "NO", then may proceed with Cephalosporin use.   Quinapril Hcl Itching   Angiotensin Receptor Blockers Other (See Comments)   Reaction:  Unknown    Lipitor [atorvastatin] Palpitations   Pentazocine Lactate Other (See Comments)   Reaction:  Unknown    Propoxyphene Hcl Other (See Comments)   Reaction unknown   Sulfonamide Derivatives Other (See Comments)   Reaction:  Unknown       Medication List       Accurate as of 06/11/17 11:59 PM. Always use your most recent med list.          acetaminophen 325 MG tablet Commonly known as:  TYLENOL Take 2 tablets (650 mg total) by mouth every 6 (six) hours as needed.   albuterol 108 (90 Base) MCG/ACT inhaler Commonly known as:  PROVENTIL HFA;VENTOLIN HFA Inhale 2 puffs into the lungs every 6 (six) hours as needed for wheezing or shortness of breath.   aspirin EC 81 MG tablet Take 81 mg by mouth daily.   atorvastatin 40 MG tablet Commonly known as:  LIPITOR Take 40 mg by mouth daily.   clopidogrel 75 MG tablet Commonly known as:  PLAVIX Take 75 mg by mouth daily.   feeding supplement (GLUCERNA SHAKE) Liqd Take 237 mLs by mouth 2 (two) times daily between meals.   furosemide 20 MG tablet Commonly known as:  LASIX Take 20 mg by mouth daily.   guaiFENesin 600 MG 12 hr tablet Commonly known as:  MUCINEX Take 600 mg by mouth 2 (two) times daily.   HUMALOG KWIKPEN 100 UNIT/ML KiwkPen Generic drug:  insulin lispro Inject 4 Units into the skin 3 (three) times daily with meals.   HYDROcodone-acetaminophen 5-325 MG tablet Commonly known as:  NORCO/VICODIN Take 1 tablet by mouth every 6 (six) hours as needed for moderate pain.   insulin glargine 100 unit/mL Sopn Commonly known as:  LANTUS Inject 0.15 mLs (15 Units total) into the skin at bedtime.   isosorbide mononitrate 30 MG 24 hr tablet Commonly known as:  IMDUR Take 0.5 tablets  (15 mg total) by mouth daily.   memantine 10 MG tablet Commonly known as:  NAMENDA Take 10 mg by mouth 2 (two) times daily.   metFORMIN 1000 MG tablet Commonly known as:  GLUCOPHAGE Take 1,000 mg by mouth 2 (two) times daily with a meal.   metoprolol succinate 25 MG 24 hr tablet Commonly known as:  TOPROL XL Take 0.5 tablets (12.5 mg total) by mouth daily.   mirtazapine 15 MG disintegrating tablet Commonly known as:  REMERON SOL-TAB Take 1 tablet (15 mg total) by mouth at bedtime.   mometasone-formoterol 100-5 MCG/ACT Aero Commonly known as:  DULERA Inhale 2 puffs into the lungs 2 (two) times daily.   nitroGLYCERIN 0.4 MG SL tablet Commonly known as:  NITROSTAT Place 0.4 mg under the tongue every 5 (five) minutes as needed for chest pain.   pantoprazole 40 MG tablet Commonly known as:  PROTONIX Take 40 mg by mouth daily.   polyethylene glycol packet Commonly known as:  MIRALAX / GLYCOLAX Take 17 g  by mouth daily as needed for mild constipation.   sennosides-docusate sodium 8.6-50 MG tablet Commonly known as:  SENOKOT-S Take 2 tablets by mouth daily.   traZODone 50 MG tablet Commonly known as:  DESYREL Take 25 mg by mouth at bedtime. 1/2 tablet       No orders of the defined types were placed in this encounter.   Immunization History  Administered Date(s) Administered  . Influenza Split 08/20/2014  . Influenza, High Dose Seasonal PF 10/06/2013  . PPD Test 11/16/2016, 12/05/2016  . Pneumococcal Polysaccharide-23 09/24/2009    Social History  Substance Use Topics  . Smoking status: Former Smoker    Packs/day: 1.00    Years: 25.00    Types: Cigarettes  . Smokeless tobacco: Never Used     Comment: over a ppd for 40+ years; quit 10/2004.  unsuccessfully tried eCigs.  . Alcohol use No    Review of Systems  DATA OBTAINED: from patient-Limited participation ;nurse-no concerns GENERAL:  no fevers, fatigue, appetite changes SKIN: No itching, rash HEENT: No  complaint RESPIRATORY: No cough, wheezing, SOB CARDIAC: No chest pain, palpitations, lower extremity edema  GI: No abdominal pain, No N/V/D or constipation, No heartburn or reflux  GU: No dysuria, frequency or urgency, or incontinence  MUSCULOSKELETAL: No unrelieved bone/joint pain NEUROLOGIC: No headache, dizziness  PSYCHIATRIC: No overt anxiety or sadness  Vitals:   06/11/17 1438  BP: 130/74  Pulse: 75  Resp: 20  Temp: 97.9 F (36.6 C)   Body mass index is 23.3 kg/m. Physical Exam  GENERAL APPEARANCE: Alert, minimally conversant, No acute distress  SKIN: No diaphoresis rash HEENT: Unremarkable RESPIRATORY: Breathing is even, unlabored. Lung sounds are clear   CARDIOVASCULAR: Heart RRR no murmurs, rubs or gallops. No peripheral edema  GASTROINTESTINAL: Abdomen is soft, non-tender, not distended w/ normal bowel sounds.  GENITOURINARY: Bladder non tender, not distended  MUSCULOSKELETAL: No abnormal joints or musculature NEUROLOGIC: Cranial nerves 2-12 grossly intact. Moves all extremities PSYCHIATRIC: Mood and affect with dementia no behavioral issues  Patient Active Problem List   Diagnosis Date Noted  . Dysphagia 05/10/2017  . Depression 03/14/2017  . Vitamin D deficiency 01/28/2017  . UTI due to extended-spectrum beta lactamase (ESBL) producing Escherichia coli 11/18/2016  . Urinary tract infection due to Proteus 11/18/2016  . Postoperative anemia due to acute blood loss 11/18/2016  . HTN (hypertension) 11/18/2016  . Chronic diastolic CHF (congestive heart failure) ([place]) 11/12/2016  . Closed right hip fracture ([place]) 11/10/2016  . 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 Obesity (BMI 30-39.9) 10/06/2015  . Tobacco use disorder 03/17/2015  . Cardiomyopathy, ischemic 03/17/2015  . Allergy to ACE inhibitors 03/17/2015  . Ventricular tachycardia ([place]) 03/17/2015  . HLD (hyperlipidemia) 12/31/2014  . COPD (chronic obstructive pulmonary disease) ([place])  12/31/2014  . Chronic cough 09/12/2013  . Memory deficit 04/23/2013  . Essential hypertension, benign 04/03/2013  . Dyslipidemia 04/03/2013  . Coronary atherosclerosis of native coronary artery 04/03/2013  . DM2 (diabetes mellitus, type 2) ([place]) 04/02/2013  . Fibromyalgia 04/20/2008    CMP     Component Value Date/Time   NA 144 04/04/2017   K 3.4 04/04/2017   CL 103 11/16/2016 0522   CO2 31 11/16/2016 0522   GLUCOSE 220 (H) 11/16/2016 0522   BUN 17 04/04/2017   CREATININE 0.7 04/04/2017   CREATININE 0.77 11/16/2016 0522   CALCIUM 8.2 (L) 11/16/2016 0522   PROT 7.7 04/27/2016 1724   ALBUMIN 4.4 04/27/2016 1724   AST 14  12/22/2016   ALT 8 12/22/2016   ALKPHOS 79 12/22/2016   BILITOT 1.2 04/27/2016 1724   GFRNONAA >60 11/16/2016 0522   GFRAA >60 11/16/2016 0522    Recent Labs  11/11/16 0228 11/12/16 0633 11/16/16 0522 12/22/16 04/04/17  NA 137  137 137  137 138  138 140 144  K 4.6  4.6 3.8  3.8 3.6 4.0 3.4  CL 101 102 103  --   --   CO2 24 26 31   --   --   GLUCOSE 227* 216* 220*  --   --   BUN 17  17 12  12 11  11 13 17   CREATININE 0.89  0.9 0.73  0.7 0.77  0.8 0.6 0.7  CALCIUM 8.4* 8.1* 8.2*  --   --     Recent Labs  12/22/16  AST 14  ALT 8  ALKPHOS 79    Recent Labs  11/10/16 0920  11/12/16 0633 11/13/16 0508 11/16/16 0522 12/22/16 04/04/17  WBC 13.0  13.0*  < > 11.3  11.3* 12.1  12.1* 5.9  8.9 9.5 11.5  NEUTROABS 10.3*  --   --   --   --   --   --   HGB 13.8  13.8  < > 9.9*  9.9* 10.2*  10.2* 10.2* 11.7* 11.6*  HCT 41.0  41  < > 30.0*  30* 31.1*  31* 31.4* 35* 36  MCV 98.1  < > 97.7 100.6* 100.3*  --   --   PLT 198  198  < > 169  169 135*  135* 200 181 199  < > = values in this interval not displayed.  Recent Labs  12/22/16  CHOL 117  LDLCALC 59  TRIG 183*   Lab Results  Component Value Date   MICROALBUR 1.9 06/22/2017   Lab Results  Component Value Date   TSH 3.76 12/22/2016   Lab Results  Component Value Date    HGBA1C 6.6 12/22/2016   Lab Results  Component Value Date   CHOL 117 12/22/2016   HDL 29 (A) 12/22/2016   LDLCALC 59 12/22/2016   TRIG 183 (A) 12/22/2016   CHOLHDL 2 12/19/2012    Significant Diagnostic Results in last 30 days:  No results found.  Assessment and Plan  Essential hypertension, benign Control; plan to continue Toprol-XL 12.5 mg daily and Lasix 20 mg daily; patient is also on Imdur 50 mg daily  Depression Chronic and stable; plan to continue Remeron 15 mg by mouth daily at bedtime and Zoloft 25 mg by mouth daily at bedtime  HLD (hyperlipidemia) LDL is 59, HDL 29; good control plan to continue Lipitor 40 mg by mouth daily     Ketzia Guzek D. [name], MD

## 2017-06-22 DIAGNOSIS — E119 Type 2 diabetes mellitus without complications: Secondary | ICD-10-CM | POA: Diagnosis not present

## 2017-06-22 LAB — MICROALBUMIN, URINE: Microalb, Ur: 1.9

## 2017-06-28 ENCOUNTER — Other Ambulatory Visit: Payer: Self-pay

## 2017-07-08 ENCOUNTER — Encounter: Payer: Self-pay | Admitting: Internal Medicine

## 2017-07-08 NOTE — Assessment & Plan Note (Signed)
Chronic and stable; plan to continue Remeron 15 mg by mouth daily at bedtime and Zoloft 25 mg by mouth daily at bedtime

## 2017-07-08 NOTE — Assessment & Plan Note (Signed)
Control; plan to continue Toprol-XL 12.5 mg daily and Lasix 20 mg daily; patient is also on Imdur 50 mg daily

## 2017-07-08 NOTE — Assessment & Plan Note (Signed)
LDL is 59, HDL 29; good control plan to continue Lipitor 40 mg by mouth daily

## 2017-07-11 ENCOUNTER — Non-Acute Institutional Stay (SKILLED_NURSING_FACILITY): Payer: Medicare Other | Admitting: Internal Medicine

## 2017-07-11 ENCOUNTER — Encounter: Payer: Self-pay | Admitting: Internal Medicine

## 2017-07-11 DIAGNOSIS — F0390 Unspecified dementia without behavioral disturbance: Secondary | ICD-10-CM

## 2017-07-11 DIAGNOSIS — J449 Chronic obstructive pulmonary disease, unspecified: Secondary | ICD-10-CM | POA: Diagnosis not present

## 2017-07-11 DIAGNOSIS — K219 Gastro-esophageal reflux disease without esophagitis: Secondary | ICD-10-CM | POA: Diagnosis not present

## 2017-07-11 NOTE — Progress Notes (Signed)
Location:  [place] Room Number: 212D Place of Service:  SNF [phone])  Provider: Noah Delaine. [name], MD  Harlan Stains, MD  Patient Care Team: Harlan Stains, MD as PCP - General [facility])  Extended Emergency Contact Information Primary Emergency Contact: [name],[name] Address: [address]          [place], [place] [address] [name] of [place] Phone: [phone] Work Phone: [phone] Mobile Phone: [phone] Relation: Daughter Secondary Emergency Contact: [name],[name]  [place] Phone: [phone] Relation: Niece    Allergies: Lisinopril; Spironolactone; Vancomycin; Donepezil; Eggs or egg-derived products; Erythromycin; Macrolides and ketolides; Penicillins; Quinapril hcl; Angiotensin receptor blockers; Lipitor [atorvastatin]; Pentazocine lactate; Propoxyphene hcl; and Sulfonamide derivatives  Chief Complaint  Patient presents with  . Medical Management of Chronic Issues    routine visit    HPI: Patient is 76 y.o. female who Is being seen for routine issues of GERD, dementia, and COPD.  Past Medical History:  Diagnosis Date  . Allergy to ACE inhibitors 03/17/2015  . Anxiety   . CAD (coronary artery disease)    a. s/p inferior STEMI 12/29/10 with rotablator atherectomy RCA 12/31/10 and DES x 2 RCA;  b. Lexiscan Myoview (02/2015): mild reversible apical anterior perfusion defect. C. cath 02/2015 50% LAD lesion with patent stent, Tx Rx    . Cardiomyopathy, ischemic 03/17/2015  . Chronic diastolic CHF (congestive heart failure) ([place]) 11/12/2016  . COPD (chronic obstructive pulmonary disease) ([place])   . Coronary atherosclerosis of native coronary artery 04/03/2013  . Dementia   . Depression 03/14/2017  . Diabetes mellitus    type 2  . Diverticulosis   . DM2 (diabetes mellitus, type 2) ([place]) 04/02/2013  . Dysphagia 05/10/2017   Minimal 6/20 - pureed diet  . Fibromyalgia 04/20/2008   Qualifier: Diagnosis of   By: Nelson-Smith CMA (AAMA), Dottie    . GERD (gastroesophageal reflux disease)   . Glaucoma   . HTN (hypertension)   . Hyperlipidemia   . Ischemic cardiomyopathy    EF 45%cath 2012 EF55% 5/12; 35% 11/14  . Memory deficit   . Nephrolithiasis   . Obesity (BMI 30-39.9) 10/06/2015  . Osteoarthritis   . Overweight(278.02)   . Respiratory arrest//ACE Inhibitor presumed cause   . 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 Sleep apnea   . Small bowel obstruction (HCC)    from a sigmoid stricture  . Ventricular tachycardia ([place]) 03/17/2015  . Vitamin D deficiency 01/28/2017    Past Surgical History:  Procedure Laterality Date  . bilateral tubal ligation    . CARDIAC CATHETERIZATION N/A 03/18/2015   Procedure: LEFT HEART CATH AND CORONARY ANGIOGRAPHY;  Surgeon: Lorretta Harp, MD;  Location: [facility] CATH LAB;  Service: Cardiovascular;  Laterality: N/A;  . CARDIAC SURGERY    . HIP ARTHROPLASTY Right 11/10/2016   Procedure: ARTHROPLASTY BIPOLAR HIP (HEMIARTHROPLASTY);  Surgeon: Marchia Bond, MD;  Location: [facility];  Service: Orthopedics;  Laterality: Right;  . 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 LITHOTRIPSY      Allergies as of 07/11/2017      Reactions   Lisinopril Other (See Comments)   Reaction:  Respiratory arrest    Spironolactone Anaphylaxis   Vancomycin Anaphylaxis, Rash   Donepezil Diarrhea   Eggs Or Egg-derived Products Diarrhea   Erythromycin Other (See Comments)   Reaction:  Makes pt feel weird    Macrolides And Ketolides Other (See Comments)   Reaction:  Unknown    Penicillins Itching, Other (See Comments)   Has patient had a PCN reaction causing immediate rash, facial/tongue/throat swelling, SOB or lightheadedness with hypotension: No Has patient had a PCN reaction causing severe rash involving mucus membranes or skin necrosis: No Has patient had a PCN reaction that required hospitalization No Has patient had a PCN  reaction occurring within the last 10 years: No If all of the above answers are "NO", then may proceed with Cephalosporin use.   Quinapril Hcl Itching   Angiotensin Receptor Blockers Other (See Comments)   Reaction:  Unknown    Lipitor [atorvastatin] Palpitations   Pentazocine Lactate Other (See Comments)   Reaction:  Unknown    Propoxyphene Hcl Other (See Comments)   Reaction unknown   Sulfonamide Derivatives Other (See Comments)   Reaction:  Unknown       Medication List       Accurate as of 07/11/17 11:59 PM. Always use your most recent med list.          acetaminophen 325 MG tablet Commonly known as:  TYLENOL Take 2 tablets (650 mg total) by mouth every 6 (six) hours as needed.   albuterol 108 (90 Base) MCG/ACT inhaler Commonly known as:  PROVENTIL HFA;VENTOLIN HFA Inhale 2 puffs into the lungs every 6 (six) hours as needed for wheezing or shortness of breath.   aspirin EC 81 MG tablet Take 81 mg by mouth daily.   atorvastatin 40 MG tablet Commonly known as:  LIPITOR Take 40 mg by mouth daily.   clopidogrel 75 MG tablet Commonly known as:  PLAVIX Take 75 mg by mouth daily.   feeding supplement (GLUCERNA SHAKE) Liqd Take 237 mLs by mouth 2 (two) times daily between meals.   furosemide 20 MG tablet Commonly known as:  LASIX Take 20 mg by mouth daily.   guaiFENesin 600 MG 12 hr tablet Commonly known as:  MUCINEX Take 600 mg by mouth 2 (two) times daily.   HUMALOG KWIKPEN 100 UNIT/ML KiwkPen Generic drug:  insulin lispro Inject 4 Units into the skin 3 (three) times daily with meals.   HYDROcodone-acetaminophen 5-325 MG tablet Commonly known as:  NORCO/VICODIN Take 1 tablet by mouth every 6 (six) hours as needed for moderate pain.   insulin glargine 100 unit/mL Sopn Commonly known as:  LANTUS Inject 0.15 mLs (15 Units total) into the skin at bedtime.   isosorbide mononitrate 30 MG 24 hr tablet Commonly known as:  IMDUR Take 0.5 tablets (15 mg total) by  mouth daily.   LORazepam 0.5 MG tablet Commonly known as:  ATIVAN Take 0.5 mg by mouth. Take one tablet twice daily as needed for anxiety   memantine 10 MG tablet Commonly known as:  NAMENDA Take 10 mg by mouth 2 (two) times daily.   metFORMIN 1000 MG tablet Commonly known as:  GLUCOPHAGE Take 1,000 mg by mouth 2 (two) times daily with a meal.   metoprolol succinate 25 MG 24 hr tablet Commonly known as:  TOPROL XL Take 0.5 tablets (12.5 mg total) by mouth daily.   mirtazapine 15 MG disintegrating tablet Commonly known as:  REMERON SOL-TAB Take 1 tablet (15 mg total) by mouth at bedtime.   mometasone-formoterol 100-5 MCG/ACT Aero Commonly known as:  DULERA Inhale 2 puffs into the lungs 2 (two) times daily.   nitroGLYCERIN 0.4 MG SL tablet Commonly known as:  NITROSTAT Place 0.4 mg under the tongue every 5 (five) minutes as needed for chest pain.   pantoprazole  40 MG tablet Commonly known as:  PROTONIX Take 40 mg by mouth daily.   polyethylene glycol packet Commonly known as:  MIRALAX / GLYCOLAX Take 17 g by mouth daily as needed for mild constipation.   sennosides-docusate sodium 8.6-50 MG tablet Commonly known as:  SENOKOT-S Take 2 tablets by mouth daily.   traZODone 50 MG tablet Commonly known as:  DESYREL Take 25 mg by mouth at bedtime. 1/2 tablet       Meds ordered this encounter  Medications  . LORazepam (ATIVAN) 0.5 MG tablet    Sig: Take 0.5 mg by mouth. Take one tablet twice daily as needed for anxiety    Immunization History  Administered Date(s) Administered  . Influenza Split 08/20/2014  . Influenza, High Dose Seasonal PF 10/06/2013  . PPD Test 11/16/2016, 12/05/2016  . Pneumococcal Polysaccharide-23 09/24/2009    Social History  Substance Use Topics  . Smoking status: Former Smoker    Packs/day: 1.00    Years: 25.00    Types: Cigarettes  . Smokeless tobacco: Never Used     Comment: over a ppd for 40+ years; quit 10/2004.  unsuccessfully  tried eCigs.  . Alcohol use No    Review of Systems  DATA OBTAINED: from patient-Limited; nursing-no concerns GENERAL:  no fevers, fatigue, appetite changes SKIN: No itching, rash HEENT: No complaint RESPIRATORY: No cough, wheezing, SOB CARDIAC: No chest pain, palpitations, lower extremity edema  GI: No abdominal pain, No N/V/D or constipation, No heartburn or reflux  GU: No dysuria, frequency or urgency, or incontinence  MUSCULOSKELETAL: No unrelieved bone/joint pain NEUROLOGIC: No headache, dizziness  PSYCHIATRIC: No overt anxiety or sadness  Vitals:   07/11/17 1533  BP: 130/74  Pulse: 76  Resp: 17  Temp: 97.9 F (36.6 C)   Body mass index is 24.34 kg/m. Physical Exam  GENERAL APPEARANCE: Alert, Moderately conversant, No acute distress ; often at the nursing station in her wheelchair SKIN: No diaphoresis rash HEENT: Unremarkable RESPIRATORY: Breathing is even, unlabored. Lung sounds are clear   CARDIOVASCULAR: Heart RRR no murmurs, rubs or gallops. No peripheral edema  GASTROINTESTINAL: Abdomen is soft, non-tender, not distended w/ normal bowel sounds.  GENITOURINARY: Bladder non tender, not distended  MUSCULOSKELETAL: No abnormal joints or musculature NEUROLOGIC: Cranial nerves 2-12 grossly intact. Moves all extremities PSYCHIATRIC: Mood and affect with dementia, no behavioral issues  Patient's physical exam has not changed from prior visit  Patient Active Problem List   Diagnosis Date Noted  . GERD (gastroesophageal reflux disease) 08/04/2017  . Dysphagia 05/10/2017  . Depression 03/14/2017  . Vitamin D deficiency 01/28/2017  . UTI due to extended-spectrum beta lactamase (ESBL) producing Escherichia coli 11/18/2016  . Urinary tract infection due to Proteus 11/18/2016  . Postoperative anemia due to acute blood loss 11/18/2016  . HTN (hypertension) 11/18/2016  . Chronic diastolic CHF (congestive heart failure) ([place]) 11/12/2016  . Closed right hip fracture ([place])  11/10/2016  . 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 Obesity (BMI 30-39.9) 10/06/2015  . Tobacco use disorder 03/17/2015  . Cardiomyopathy, ischemic 03/17/2015  . Allergy to ACE inhibitors 03/17/2015  . Ventricular tachycardia ([place]) 03/17/2015  . HLD (hyperlipidemia) 12/31/2014  . COPD (chronic obstructive pulmonary disease) ([place]) 12/31/2014  . Chronic cough 09/12/2013  . Memory deficit 04/23/2013  . Essential hypertension, benign 04/03/2013  . Dyslipidemia 04/03/2013  . Coronary atherosclerosis of native coronary artery 04/03/2013  . DM2 (diabetes mellitus, type 2) ([place]) 04/02/2013  . Fibromyalgia 04/20/2008    CMP  Component Value Date/Time   NA 144 04/04/2017   K 3.4 04/04/2017   CL 103 11/16/2016 0522   CO2 31 11/16/2016 0522   GLUCOSE 220 (H) 11/16/2016 0522   BUN 17 04/04/2017   CREATININE 0.7 04/04/2017   CREATININE 0.77 11/16/2016 0522   CALCIUM 8.2 (L) 11/16/2016 0522   PROT 7.7 04/27/2016 1724   ALBUMIN 4.4 04/27/2016 1724   AST 14 12/22/2016   ALT 8 12/22/2016   ALKPHOS 79 12/22/2016   BILITOT 1.2 04/27/2016 1724   GFRNONAA >60 11/16/2016 0522   GFRAA >60 11/16/2016 0522    Recent Labs  11/11/16 0228 11/12/16 0633 11/16/16 0522 12/22/16 04/04/17  NA 137  137 137  137 138  138 140 144  K 4.6  4.6 3.8  3.8 3.6 4.0 3.4  CL 101 102 103  --   --   CO2 24 26 31   --   --   GLUCOSE 227* 216* 220*  --   --   BUN 17  17 12  12 11  11 13 17   CREATININE 0.89  0.9 0.73  0.7 0.77  0.8 0.6 0.7  CALCIUM 8.4* 8.1* 8.2*  --   --     Recent Labs  12/22/16  AST 14  ALT 8  ALKPHOS 79    Recent Labs  11/10/16 0920  11/12/16 0633 11/13/16 0508 11/16/16 0522 12/22/16 04/04/17  WBC 13.0  13.0*  < > 11.3  11.3* 12.1  12.1* 5.9  8.9 9.5 11.5  NEUTROABS 10.3*  --   --   --   --   --   --   HGB 13.8  13.8  < > 9.9*  9.9* 10.2*  10.2* 10.2* 11.7* 11.6*  HCT 41.0  41  < > 30.0*  30* 31.1*  31* 31.4* 35* 36  MCV 98.1  <  > 97.7 100.6* 100.3*  --   --   PLT 198  198  < > 169  169 135*  135* 200 181 199  < > = values in this interval not displayed.  Recent Labs  12/22/16  CHOL 117  LDLCALC 59  TRIG 183*   Lab Results  Component Value Date   MICROALBUR 1.9 06/22/2017   Lab Results  Component Value Date   TSH 3.76 12/22/2016   Lab Results  Component Value Date   HGBA1C 6.6 12/22/2016   Lab Results  Component Value Date   CHOL 117 12/22/2016   HDL 29 (A) 12/22/2016   LDLCALC 59 12/22/2016   TRIG 183 (A) 12/22/2016   CHOLHDL 2 12/19/2012    Significant Diagnostic Results in last 30 days:  No results found.  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 [name], MD

## 2017-07-19 DIAGNOSIS — F331 Major depressive disorder, recurrent, moderate: Secondary | ICD-10-CM | POA: Diagnosis not present

## 2017-07-19 DIAGNOSIS — F419 Anxiety disorder, unspecified: Secondary | ICD-10-CM | POA: Diagnosis not present

## 2017-07-19 DIAGNOSIS — F0390 Unspecified dementia without behavioral disturbance: Secondary | ICD-10-CM | POA: Diagnosis not present

## 2017-07-23 DIAGNOSIS — I5032 Chronic diastolic (congestive) heart failure: Secondary | ICD-10-CM | POA: Diagnosis not present

## 2017-07-23 DIAGNOSIS — R488 Other symbolic dysfunctions: Secondary | ICD-10-CM | POA: Diagnosis not present

## 2017-07-23 DIAGNOSIS — R278 Other lack of coordination: Secondary | ICD-10-CM | POA: Diagnosis not present

## 2017-08-04 ENCOUNTER — Encounter: Payer: Self-pay | Admitting: Internal Medicine

## 2017-08-04 DIAGNOSIS — K219 Gastro-esophageal reflux disease without esophagitis: Secondary | ICD-10-CM | POA: Insufficient documentation

## 2017-08-04 NOTE — Assessment & Plan Note (Signed)
No recent exacerbations; plan to continue to area 2 puffs twice a day and guaifenesin 600 mg twice a day

## 2017-08-04 NOTE — Assessment & Plan Note (Signed)
Chronic and stable; plan to continue Namenda 10 mg by mouth twice a day

## 2017-08-04 NOTE — Assessment & Plan Note (Signed)
No reports of reflux; plan to continue Protonix 40 mg by mouth daily

## 2017-08-08 ENCOUNTER — Non-Acute Institutional Stay (SKILLED_NURSING_FACILITY): Payer: Medicare Other | Admitting: Internal Medicine

## 2017-08-08 ENCOUNTER — Encounter: Payer: Self-pay | Admitting: Internal Medicine

## 2017-08-08 DIAGNOSIS — E559 Vitamin D deficiency, unspecified: Secondary | ICD-10-CM

## 2017-08-08 DIAGNOSIS — E119 Type 2 diabetes mellitus without complications: Secondary | ICD-10-CM

## 2017-08-08 DIAGNOSIS — J449 Chronic obstructive pulmonary disease, unspecified: Secondary | ICD-10-CM | POA: Diagnosis not present

## 2017-08-08 NOTE — Progress Notes (Signed)
Location:  [place] Room Number: 212D Place of Service:  SNF [phone]) Provider: Noah Delaine. [name], MD Harlan Stains, MD  Patient Care Team: Harlan Stains, MD as PCP - General [facility])  Extended Emergency Contact Information Primary Emergency Contact: [name],[name] Address: [address]          [place], [place] [address] [name] of [place] Phone: [phone] Work Phone: [phone] Mobile Phone: [phone] Relation: Daughter Secondary Emergency Contact: [name],[name]  [place] Phone: [phone] Relation: Niece    Allergies: Lisinopril; Spironolactone; Vancomycin; Donepezil; Eggs or egg-derived products; Erythromycin; Macrolides and ketolides; Penicillins; Quinapril hcl; Angiotensin receptor blockers; Lipitor [atorvastatin]; Pentazocine lactate; Propoxyphene hcl; and Sulfonamide derivatives  Chief Complaint  Patient presents with  . Medical Management of Chronic Issues    routine visit    HPI: Patient is 76 y.o. female who Is being seen for routine issues of vitamin D deficiency, diabetes mellitus 2, and COPD.  Past Medical History:  Diagnosis Date  . Allergy to ACE inhibitors 03/17/2015  . Anxiety   . CAD (coronary artery disease)    a. s/p inferior STEMI 12/29/10 with rotablator atherectomy RCA 12/31/10 and DES x 2 RCA;  b. Lexiscan Myoview (02/2015): mild reversible apical anterior perfusion defect. C. cath 02/2015 50% LAD lesion with patent stent, Tx Rx    . Cardiomyopathy, ischemic 03/17/2015  . Chronic diastolic CHF (congestive heart failure) ([place]) 11/12/2016  . COPD (chronic obstructive pulmonary disease) ([place])   . Coronary atherosclerosis of native coronary artery 04/03/2013  . Dementia   . Depression 03/14/2017  . Diabetes mellitus    type 2  . Diverticulosis   . DM2 (diabetes mellitus, type 2) ([place]) 04/02/2013  . Dysphagia 05/10/2017   Minimal 6/20 - pureed diet  . Fibromyalgia 04/20/2008   Qualifier: Diagnosis of  By: Nelson-Smith CMA (AAMA), Dottie    . GERD (gastroesophageal reflux disease)   . Glaucoma   . HTN (hypertension)   . Hyperlipidemia   . Ischemic cardiomyopathy    EF 45%cath 2012 EF55% 5/12; 35% 11/14  . Memory deficit   . Nephrolithiasis   . Obesity (BMI 30-39.9) 10/06/2015  . Osteoarthritis   . Overweight(278.02)   . Respiratory arrest//ACE Inhibitor presumed cause   . 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 Sleep apnea   . Small bowel obstruction (HCC)    from a sigmoid stricture  . Ventricular tachycardia ([place]) 03/17/2015  . Vitamin D deficiency 01/28/2017    Past Surgical History:  Procedure Laterality Date  . bilateral tubal ligation    . CARDIAC CATHETERIZATION N/A 03/18/2015   Procedure: LEFT HEART CATH AND CORONARY ANGIOGRAPHY;  Surgeon: Lorretta Harp, MD;  Location: [facility] CATH LAB;  Service: Cardiovascular;  Laterality: N/A;  . CARDIAC SURGERY    . HIP ARTHROPLASTY Right 11/10/2016   Procedure: ARTHROPLASTY BIPOLAR HIP (HEMIARTHROPLASTY);  Surgeon: Marchia Bond, MD;  Location: [facility];  Service: Orthopedics;  Laterality: Right;  . 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 LITHOTRIPSY      Allergies as of 08/08/2017      Reactions   Lisinopril Other (See Comments)   Reaction:  Respiratory arrest    Spironolactone Anaphylaxis   Vancomycin Anaphylaxis, Rash   Donepezil Diarrhea   Eggs Or Egg-derived Products Diarrhea   Erythromycin Other (See Comments)   Reaction:  Makes pt feel weird    Macrolides And Ketolides Other (See Comments)   Reaction:  Unknown    Penicillins Itching, Other (See Comments)   Has patient had a PCN reaction causing immediate rash, facial/tongue/throat swelling, SOB or lightheadedness with hypotension: No Has patient had a PCN reaction causing severe rash involving mucus membranes or skin necrosis: No Has patient had a PCN reaction that required hospitalization No Has  patient had a PCN reaction occurring within the last 10 years: No If all of the above answers are "NO", then may proceed with Cephalosporin use.   Quinapril Hcl Itching   Angiotensin Receptor Blockers Other (See Comments)   Reaction:  Unknown    Lipitor [atorvastatin] Palpitations   Pentazocine Lactate Other (See Comments)   Reaction:  Unknown    Propoxyphene Hcl Other (See Comments)   Reaction unknown   Sulfonamide Derivatives Other (See Comments)   Reaction:  Unknown       Medication List       Accurate as of 08/08/17 11:59 PM. Always use your most recent med list.          acetaminophen 325 MG tablet Commonly known as:  TYLENOL Take 2 tablets (650 mg total) by mouth every 6 (six) hours as needed.   albuterol 108 (90 Base) MCG/ACT inhaler Commonly known as:  PROVENTIL HFA;VENTOLIN HFA Inhale 2 puffs into the lungs every 6 (six) hours as needed for wheezing or shortness of breath.   aspirin EC 81 MG tablet Take 81 mg by mouth daily.   atorvastatin 40 MG tablet Commonly known as:  LIPITOR Take 40 mg by mouth daily.   clopidogrel 75 MG tablet Commonly known as:  PLAVIX Take 75 mg by mouth daily.   feeding supplement (GLUCERNA SHAKE) Liqd Take 237 mLs by mouth 2 (two) times daily between meals.   furosemide 20 MG tablet Commonly known as:  LASIX Take 20 mg by mouth daily.   guaiFENesin 600 MG 12 hr tablet Commonly known as:  MUCINEX Take 600 mg by mouth 2 (two) times daily.   HUMALOG KWIKPEN 100 UNIT/ML KiwkPen Generic drug:  insulin lispro Inject 4 Units into the skin 3 (three) times daily with meals.   HYDROcodone-acetaminophen 5-325 MG tablet Commonly known as:  NORCO/VICODIN Take 1 tablet by mouth every 6 (six) hours as needed for moderate pain.   insulin glargine 100 unit/mL Sopn Commonly known as:  LANTUS Inject 0.15 mLs (15 Units total) into the skin at bedtime.   isosorbide mononitrate 30 MG 24 hr tablet Commonly known as:  IMDUR Take 0.5 tablets  (15 mg total) by mouth daily.   LORazepam 0.5 MG tablet Commonly known as:  ATIVAN Take 0.5 mg by mouth. Take one tablet twice daily as needed for anxiety   memantine 10 MG tablet Commonly known as:  NAMENDA Take 10 mg by mouth 2 (two) times daily.   metFORMIN 1000 MG tablet Commonly known as:  GLUCOPHAGE Take 1,000 mg by mouth 2 (two) times daily with a meal.   metoprolol succinate 25 MG 24 hr tablet Commonly known as:  TOPROL XL Take 0.5 tablets (12.5 mg total) by mouth daily.   mirtazapine 15 MG disintegrating tablet Commonly known as:  REMERON SOL-TAB Take 1 tablet (15 mg total) by mouth at bedtime.   mometasone-formoterol 100-5 MCG/ACT Aero Commonly known as:  DULERA Inhale 2 puffs into the lungs 2 (two) times daily.   nitroGLYCERIN 0.4 MG SL tablet Commonly known as:  NITROSTAT Place 0.4 mg under the tongue every 5 (five) minutes as needed for chest pain.   pantoprazole  40 MG tablet Commonly known as:  PROTONIX Take 40 mg by mouth daily.   polyethylene glycol packet Commonly known as:  MIRALAX / GLYCOLAX Take 17 g by mouth daily as needed for mild constipation.   sennosides-docusate sodium 8.6-50 MG tablet Commonly known as:  SENOKOT-S Take 2 tablets by mouth daily.   traZODone 50 MG tablet Commonly known as:  DESYREL Take 25 mg by mouth at bedtime. 1/2 tablet       No orders of the defined types were placed in this encounter.   Immunization History  Administered Date(s) Administered  . Influenza Split 08/20/2014  . Influenza, High Dose Seasonal PF 10/06/2013  . PPD Test 11/16/2016, 12/05/2016  . Pneumococcal Polysaccharide-23 09/24/2009    Social History  Substance Use Topics  . Smoking status: Former Smoker    Packs/day: 1.00    Years: 25.00    Types: Cigarettes  . Smokeless tobacco: Never Used     Comment: over a ppd for 40+ years; quit 10/2004.  unsuccessfully tried eCigs.  . Alcohol use No    Review of Systems  DATA OBTAINED: from  patient-Limited; nursing-no concerns GENERAL:  no fevers, fatigue, appetite changes SKIN: No itching, rash HEENT: No complaint RESPIRATORY: No cough, wheezing, SOB CARDIAC: No chest pain, palpitations, lower extremity edema  GI: No abdominal pain, No N/V/D or constipation, No heartburn or reflux  GU: No dysuria, frequency or urgency, or incontinence  MUSCULOSKELETAL: No unrelieved bone/joint pain NEUROLOGIC: No headache, dizziness  PSYCHIATRIC: No overt anxiety or sadness  Vitals:   08/08/17 1623  BP: 130/74  Pulse: 98  Resp: 20  Temp: 97.9 F (36.6 C)   Body mass index is 23.28 kg/m. Physical Exam  GENERAL APPEARANCE: Alert, Minimally conversant, No acute distress; sitting in her nursing station daily  SKIN: No diaphoresis rash HEENT: Unremarkable RESPIRATORY: Breathing is even, unlabored. Lung sounds are clear   CARDIOVASCULAR: Heart RRR no murmurs, rubs or gallops. No peripheral edema  GASTROINTESTINAL: Abdomen is soft, non-tender, not distended w/ normal bowel sounds.  GENITOURINARY: Bladder non tender, not distended  MUSCULOSKELETAL: No abnormal joints or musculature NEUROLOGIC: Cranial nerves 2-12 grossly intact. Moves all extremities PSYCHIATRIC: Mood and affect dementia, no behavioral issues  Patient Active Problem List   Diagnosis Date Noted  . GERD (gastroesophageal reflux disease) 08/04/2017  . Dysphagia 05/10/2017  . Depression 03/14/2017  . Vitamin D deficiency 01/28/2017  . UTI due to extended-spectrum beta lactamase (ESBL) producing Escherichia coli 11/18/2016  . Urinary tract infection due to Proteus 11/18/2016  . Postoperative anemia due to acute blood loss 11/18/2016  . HTN (hypertension) 11/18/2016  . Chronic diastolic CHF (congestive heart failure) ([place]) 11/12/2016  . Closed right hip fracture ([place]) 11/10/2016  . 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 Obesity (BMI 30-39.9) 10/06/2015  . Tobacco use disorder 03/17/2015  .  Cardiomyopathy, ischemic 03/17/2015  . Allergy to ACE inhibitors 03/17/2015  . Ventricular tachycardia ([place]) 03/17/2015  . HLD (hyperlipidemia) 12/31/2014  . COPD (chronic obstructive pulmonary disease) ([place]) 12/31/2014  . Chronic cough 09/12/2013  . Memory deficit 04/23/2013  . Essential hypertension, benign 04/03/2013  . Dyslipidemia 04/03/2013  . Coronary atherosclerosis of native coronary artery 04/03/2013  . DM2 (diabetes mellitus, type 2) ([place]) 04/02/2013  . Fibromyalgia 04/20/2008    CMP     Component Value Date/Time   NA 144 04/04/2017   K 3.4 04/04/2017   CL 103 11/16/2016 0522   CO2 31 11/16/2016 0522   GLUCOSE 220 (H) 11/16/2016  0522   BUN 17 04/04/2017   CREATININE 0.7 04/04/2017   CREATININE 0.77 11/16/2016 0522   CALCIUM 8.2 (L) 11/16/2016 0522   PROT 7.7 04/27/2016 1724   ALBUMIN 4.4 04/27/2016 1724   AST 14 12/22/2016   ALT 8 12/22/2016   ALKPHOS 79 12/22/2016   BILITOT 1.2 04/27/2016 1724   GFRNONAA >60 11/16/2016 0522   GFRAA >60 11/16/2016 0522    Recent Labs  11/11/16 0228 11/12/16 0633 11/16/16 0522 12/22/16 04/04/17  NA 137  137 137  137 138  138 140 144  K 4.6  4.6 3.8  3.8 3.6 4.0 3.4  CL 101 102 103  --   --   CO2 24 26 31   --   --   GLUCOSE 227* 216* 220*  --   --   BUN 17  17 12  12 11  11 13 17   CREATININE 0.89  0.9 0.73  0.7 0.77  0.8 0.6 0.7  CALCIUM 8.4* 8.1* 8.2*  --   --     Recent Labs  12/22/16  AST 14  ALT 8  ALKPHOS 79    Recent Labs  11/10/16 0920  11/12/16 0633 11/13/16 0508 11/16/16 0522 12/22/16 04/04/17  WBC 13.0  13.0*  < > 11.3  11.3* 12.1  12.1* 5.9  8.9 9.5 11.5  NEUTROABS 10.3*  --   --   --   --   --   --   HGB 13.8  13.8  < > 9.9*  9.9* 10.2*  10.2* 10.2* 11.7* 11.6*  HCT 41.0  41  < > 30.0*  30* 31.1*  31* 31.4* 35* 36  MCV 98.1  < > 97.7 100.6* 100.3*  --   --   PLT 198  198  < > 169  169 135*  135* 200 181 199  < > = values in this interval not displayed.  Recent Labs   12/22/16  CHOL 117  LDLCALC 59  TRIG 183*   Lab Results  Component Value Date   MICROALBUR 1.9 06/22/2017   Lab Results  Component Value Date   TSH 3.76 12/22/2016   Lab Results  Component Value Date   HGBA1C 6.6 12/22/2016   Lab Results  Component Value Date   CHOL 117 12/22/2016   HDL 29 (A) 12/22/2016   LDLCALC 59 12/22/2016   TRIG 183 (A) 12/22/2016   CHOLHDL 2 12/19/2012    Significant Diagnostic Results in last 30 days:  No results found.  Assessment and Plan  Vitamin D deficiency Vitamin D 22; appears she is not on 50,000 units weekly; and vitamin order for that today  Diabetes mellitus without complication (HCC) N2T was 6; withagoodcontrol;plantocontinuecurrentregimenwhichismetformin1000mg bymouthtwiceaday,Lantus15unitsnightlyandNovoLog4unitswitheachmeal.Patientisonstatin,patientisnotonanAceduetoallergybutmicroalbuminwas1.9  COPD (chronic obstructive pulmonary disease) ([place]) No reported exacerbation; continue dulera 2 puffs daily and guaifenesin 600 mg by mouth twice a day     Abriana Saltos D. [name], MD

## 2017-08-10 DIAGNOSIS — E785 Hyperlipidemia, unspecified: Secondary | ICD-10-CM | POA: Diagnosis not present

## 2017-08-10 DIAGNOSIS — E119 Type 2 diabetes mellitus without complications: Secondary | ICD-10-CM | POA: Diagnosis not present

## 2017-08-10 DIAGNOSIS — E039 Hypothyroidism, unspecified: Secondary | ICD-10-CM | POA: Diagnosis not present

## 2017-08-10 DIAGNOSIS — D649 Anemia, unspecified: Secondary | ICD-10-CM | POA: Diagnosis not present

## 2017-08-15 DIAGNOSIS — F0390 Unspecified dementia without behavioral disturbance: Secondary | ICD-10-CM | POA: Diagnosis not present

## 2017-08-15 DIAGNOSIS — F419 Anxiety disorder, unspecified: Secondary | ICD-10-CM | POA: Diagnosis not present

## 2017-08-15 DIAGNOSIS — F331 Major depressive disorder, recurrent, moderate: Secondary | ICD-10-CM | POA: Diagnosis not present

## 2017-09-04 ENCOUNTER — Non-Acute Institutional Stay (SKILLED_NURSING_FACILITY): Payer: Medicare Other | Admitting: Internal Medicine

## 2017-09-04 ENCOUNTER — Encounter: Payer: Self-pay | Admitting: Internal Medicine

## 2017-09-04 DIAGNOSIS — I5032 Chronic diastolic (congestive) heart failure: Secondary | ICD-10-CM | POA: Diagnosis not present

## 2017-09-04 DIAGNOSIS — I25118 Atherosclerotic heart disease of native coronary artery with other forms of angina pectoris: Secondary | ICD-10-CM

## 2017-09-04 DIAGNOSIS — R1312 Dysphagia, oropharyngeal phase: Secondary | ICD-10-CM | POA: Diagnosis not present

## 2017-09-04 NOTE — Progress Notes (Signed)
Location:  [facility] Room Number: 212D Place of Service:  SNF [phone])  Provider: Noah Delaine. [name], MD  Harlan Stains, MD  Patient Care Team: Harlan Stains, MD as PCP - [facility])  Extended Emergency Contact Information Primary Emergency Contact: [name],[name] Address: [address]          [place], [place] [address] [name] Phone: [phone] Work Phone: [phone] Mobile Phone: [phone] Relation: Daughter Secondary Emergency Contact: [name],[name]  [place] Phone: [phone] Relation: Niece    Allergies: Lisinopril; Spironolactone; Vancomycin; Donepezil; Eggs or egg-derived products; Erythromycin; Macrolides and ketolides; Penicillins; Quinapril hcl; Angiotensin receptor blockers; Lipitor [atorvastatin]; Pentazocine lactate; Propoxyphene hcl; and Sulfonamide derivatives  Chief Complaint  Patient presents with  . Medical Management of Chronic Issues    Routine visit    HPI: Patient is 76 y.o. female who Is being seen for routine issues of congestive heart failure, coronary artery disease, and dysphasia.  Past Medical History:  Diagnosis Date  . Allergy to ACE inhibitors 03/17/2015  . Anxiety   . CAD (coronary artery disease)    a. s/p inferior STEMI 12/29/10 with rotablator atherectomy RCA 12/31/10 and DES x 2 RCA;  b. Lexiscan Myoview (02/2015): mild reversible apical anterior perfusion defect. C. cath 02/2015 50% LAD lesion with patent stent, Tx Rx    . Cardiomyopathy, ischemic 03/17/2015  . Chronic diastolic CHF (congestive heart failure) ([place]) 11/12/2016  . COPD (chronic obstructive pulmonary disease) ([place])   . Coronary atherosclerosis of native coronary artery 04/03/2013  . Dementia   . Depression 03/14/2017  . Diabetes mellitus    type 2  . Diverticulosis   . DM2 (diabetes mellitus, type 2) ([place]) 04/02/2013  . Dysphagia 05/10/2017   Minimal 6/20 - pureed diet  .  Fibromyalgia 04/20/2008   Qualifier: Diagnosis of  By: Nelson-Smith CMA (AAMA), Dottie    . GERD (gastroesophageal reflux disease)   . Glaucoma   . HTN (hypertension)   . Hyperlipidemia   . Ischemic cardiomyopathy    EF 45%cath 2012 EF55% 5/12; 35% 11/14  . Memory deficit   . Nephrolithiasis   . Obesity (BMI 30-39.9) 10/06/2015  . Osteoarthritis   . Overweight(278.02)   . Respiratory arrest//ACE Inhibitor presumed cause   . 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 Sleep apnea   . Small bowel obstruction (HCC)    from a sigmoid stricture  . Ventricular tachycardia ([place]) 03/17/2015  . Vitamin D deficiency 01/28/2017    Past Surgical History:  Procedure Laterality Date  . bilateral tubal ligation    . CARDIAC CATHETERIZATION N/A 03/18/2015   Procedure: LEFT HEART CATH AND CORONARY ANGIOGRAPHY;  Surgeon: Lorretta Harp, MD;  Location: [facility] CATH LAB;  Service: Cardiovascular;  Laterality: N/A;  . CARDIAC SURGERY    . HIP ARTHROPLASTY Right 11/10/2016   Procedure: ARTHROPLASTY BIPOLAR HIP (HEMIARTHROPLASTY);  Surgeon: Marchia Bond, MD;  Location: [facility];  Service: Orthopedics;  Laterality: Right;  . 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 LITHOTRIPSY      Allergies as of 09/04/2017      Reactions   Lisinopril Other (See Comments)   Reaction:  Respiratory arrest    Spironolactone Anaphylaxis   Vancomycin Anaphylaxis, Rash   Donepezil Diarrhea   Eggs Or Egg-derived Products Diarrhea   Erythromycin Other (See Comments)   Reaction:  Makes pt feel weird    Macrolides And Ketolides Other (See Comments)  Reaction:  Unknown    Penicillins Itching, Other (See Comments)   Has patient had a PCN reaction causing immediate rash, facial/tongue/throat swelling, SOB or lightheadedness with hypotension: No Has patient had a PCN reaction causing severe rash involving mucus membranes or skin necrosis: No Has patient had a PCN reaction that  required hospitalization No Has patient had a PCN reaction occurring within the last 10 years: No If all of the above answers are "NO", then may proceed with Cephalosporin use.   Quinapril Hcl Itching   Angiotensin Receptor Blockers Other (See Comments)   Reaction:  Unknown    Lipitor [atorvastatin] Palpitations   Pentazocine Lactate Other (See Comments)   Reaction:  Unknown    Propoxyphene Hcl Other (See Comments)   Reaction unknown   Sulfonamide Derivatives Other (See Comments)   Reaction:  Unknown       Medication List       Accurate as of 09/04/17 11:59 PM. Always use your most recent med list.          acetaminophen 325 MG tablet Commonly known as:  TYLENOL Take 2 tablets (650 mg total) by mouth every 6 (six) hours as needed.   albuterol 108 (90 Base) MCG/ACT inhaler Commonly known as:  PROVENTIL HFA;VENTOLIN HFA Inhale 2 puffs into the lungs every 6 (six) hours as needed for wheezing or shortness of breath.   aspirin EC 81 MG tablet Take 81 mg by mouth daily.   atorvastatin 40 MG tablet Commonly known as:  LIPITOR Take 40 mg by mouth daily.   clopidogrel 75 MG tablet Commonly known as:  PLAVIX Take 75 mg by mouth daily.   furosemide 20 MG tablet Commonly known as:  LASIX Take 20 mg by mouth daily.   guaiFENesin 600 MG 12 hr tablet Commonly known as:  MUCINEX Take 600 mg by mouth 2 (two) times daily.   HUMALOG KWIKPEN 100 UNIT/ML KiwkPen Generic drug:  insulin lispro Inject 4 Units into the skin 3 (three) times daily with meals.   HYDROcodone-acetaminophen 5-325 MG tablet Commonly known as:  NORCO/VICODIN Take 1 tablet by mouth every 6 (six) hours as needed for moderate pain.   insulin glargine 100 unit/mL Sopn Commonly known as:  LANTUS Inject 0.15 mLs (15 Units total) into the skin at bedtime.   isosorbide mononitrate 30 MG 24 hr tablet Commonly known as:  IMDUR Take 0.5 tablets (15 mg total) by mouth daily.   memantine 10 MG tablet Commonly  known as:  NAMENDA Take 10 mg by mouth 2 (two) times daily.   metFORMIN 1000 MG tablet Commonly known as:  GLUCOPHAGE Take 1,000 mg by mouth 2 (two) times daily with a meal.   metoprolol succinate 25 MG 24 hr tablet Commonly known as:  TOPROL XL Take 0.5 tablets (12.5 mg total) by mouth daily.   mirtazapine 15 MG disintegrating tablet Commonly known as:  REMERON SOL-TAB Take 1 tablet (15 mg total) by mouth at bedtime.   mometasone-formoterol 100-5 MCG/ACT Aero Commonly known as:  DULERA Inhale 2 puffs into the lungs 2 (two) times daily.   nitroGLYCERIN 0.4 MG SL tablet Commonly known as:  NITROSTAT Place 0.4 mg under the tongue every 5 (five) minutes as needed for chest pain.   pantoprazole 40 MG tablet Commonly known as:  PROTONIX Take 40 mg by mouth daily.   polyethylene glycol packet Commonly known as:  MIRALAX / GLYCOLAX Take 17 g by mouth daily as needed for mild constipation.   sennosides-docusate sodium  8.6-50 MG tablet Commonly known as:  SENOKOT-S Take 2 tablets by mouth daily.   traZODone 50 MG tablet Commonly known as:  DESYREL Take 25 mg by mouth at bedtime. 1/2 tablet       No orders of the defined types were placed in this encounter.   Immunization History  Administered Date(s) Administered  . Influenza Split 08/20/2014  . Influenza, High Dose Seasonal PF 10/06/2013  . PPD Test 11/16/2016, 12/05/2016  . Pneumococcal Polysaccharide-23 09/24/2009    Social History  Substance Use Topics  . Smoking status: Former Smoker    Packs/day: 1.00    Years: 25.00    Types: Cigarettes  . Smokeless tobacco: Never Used     Comment: over a ppd for 40+ years; quit 10/2004.  unsuccessfully tried eCigs.  . Alcohol use No    Review of Systems  DATA OBTAINED: from patient-Limited; nursing-no concerns GENERAL:  no fevers, fatigue, appetite changes SKIN: No itching, rash HEENT: No complaint RESPIRATORY: No cough, wheezing, SOB CARDIAC: No chest pain,  palpitations, lower extremity edema  GI: No abdominal pain, No N/V/D or constipation, No heartburn or reflux  GU: No dysuria, frequency or urgency, or incontinence  MUSCULOSKELETAL: No unrelieved bone/joint pain NEUROLOGIC: No headache, dizziness  PSYCHIATRIC: No overt anxiety or sadness  Vitals:   09/04/17 1359  BP: 130/73  Pulse: 85  Resp: 20  Temp: 98.1 F (36.7 C)   Body mass index is 22.31 kg/m. Physical Exam  GENERAL APPEARANCE: Alert, Moderately conversant, No acute distress  SKIN: No diaphoresis rash HEENT: Unremarkable RESPIRATORY: Breathing is even, unlabored. Lung sounds are clear   CARDIOVASCULAR: Heart RRR no murmurs, rubs or gallops. No peripheral edema  GASTROINTESTINAL: Abdomen is soft, non-tender, not distended w/ normal bowel sounds.  GENITOURINARY: Bladder non tender, not distended  MUSCULOSKELETAL: No abnormal joints or musculature NEUROLOGIC: Cranial nerves 2-12 grossly intact. Moves all extremities PSYCHIATRIC: Mood and affect with dementia, no behavioral issues  Patient Active Problem List   Diagnosis Date Noted  . GERD (gastroesophageal reflux disease) 08/04/2017  . Dysphagia 05/10/2017  . Depression 03/14/2017  . Vitamin D deficiency 01/28/2017  . UTI due to extended-spectrum beta lactamase (ESBL) producing Escherichia coli 11/18/2016  . Urinary tract infection due to Proteus 11/18/2016  . Postoperative anemia due to acute blood loss 11/18/2016  . HTN (hypertension) 11/18/2016  . Chronic diastolic CHF (congestive heart failure) ([place]) 11/12/2016  . Closed right hip fracture ([place]) 11/10/2016  . 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 Obesity (BMI 30-39.9) 10/06/2015  . Tobacco use disorder 03/17/2015  . Cardiomyopathy, ischemic 03/17/2015  . Allergy to ACE inhibitors 03/17/2015  . Ventricular tachycardia ([place]) 03/17/2015  . HLD (hyperlipidemia) 12/31/2014  . COPD (chronic obstructive pulmonary disease) ([place]) 12/31/2014  .  Chronic cough 09/12/2013  . Memory deficit 04/23/2013  . Essential hypertension, benign 04/03/2013  . Dyslipidemia 04/03/2013  . Coronary atherosclerosis of native coronary artery 04/03/2013  . DM2 (diabetes mellitus, type 2) ([place]) 04/02/2013  . Fibromyalgia 04/20/2008    CMP     Component Value Date/Time   NA 144 04/04/2017   K 3.4 04/04/2017   CL 103 11/16/2016 0522   CO2 31 11/16/2016 0522   GLUCOSE 220 (H) 11/16/2016 0522   BUN 17 04/04/2017   CREATININE 0.7 04/04/2017   CREATININE 0.77 11/16/2016 0522   CALCIUM 8.2 (L) 11/16/2016 0522   PROT 7.7 04/27/2016 1724   ALBUMIN 4.4 04/27/2016 1724   AST 14 12/22/2016   ALT 8 12/22/2016  ALKPHOS 79 12/22/2016   BILITOT 1.2 04/27/2016 1724   GFRNONAA >60 11/16/2016 0522   GFRAA >60 11/16/2016 0522    Recent Labs  11/11/16 0228 11/12/16 0633 11/16/16 0522 12/22/16 04/04/17  NA 137  137 137  137 138  138 140 144  K 4.6  4.6 3.8  3.8 3.6 4.0 3.4  CL 101 102 103  --   --   CO2 24 26 31   --   --   GLUCOSE 227* 216* 220*  --   --   BUN 17  17 12  12 11  11 13 17   CREATININE 0.89  0.9 0.73  0.7 0.77  0.8 0.6 0.7  CALCIUM 8.4* 8.1* 8.2*  --   --     Recent Labs  12/22/16  AST 14  ALT 8  ALKPHOS 79    Recent Labs  11/10/16 0920  11/12/16 0633 11/13/16 0508 11/16/16 0522 12/22/16 04/04/17  WBC 13.0  13.0*  < > 11.3  11.3* 12.1  12.1* 5.9  8.9 9.5 11.5  NEUTROABS 10.3*  --   --   --   --   --   --   HGB 13.8  13.8  < > 9.9*  9.9* 10.2*  10.2* 10.2* 11.7* 11.6*  HCT 41.0  41  < > 30.0*  30* 31.1*  31* 31.4* 35* 36  MCV 98.1  < > 97.7 100.6* 100.3*  --   --   PLT 198  198  < > 169  169 135*  135* 200 181 199  < > = values in this interval not displayed.  Recent Labs  12/22/16  CHOL 117  LDLCALC 59  TRIG 183*   Lab Results  Component Value Date   MICROALBUR 1.9 06/22/2017   Lab Results  Component Value Date   TSH 3.76 12/22/2016   Lab Results  Component Value Date   HGBA1C 6.6  12/22/2016   Lab Results  Component Value Date   CHOL 117 12/22/2016   HDL 29 (A) 12/22/2016   LDLCALC 59 12/22/2016   TRIG 183 (A) 12/22/2016   CHOLHDL 2 12/19/2012    Significant Diagnostic Results in last 30 days:  No results found.  Assessment and Plan  Chronic diastolic CHF (congestive heart failure) (HCC) Stable and chronic; plan to continue Toprol-XL 25 mg by mouth daily and Lasix 20 mg by mouth daily  Coronary atherosclerosis of native coronary artery Stable without any episodes of chest pain; plan to continue ASA 81 mg by mouth daily, Imdur 15 mg by mouth daily, Toprol-XL 25 mg by mouth daily, Plavix 75 mg by mouth daily, when necessary nitroglycerin sublingual and patient is on a statin  Dysphagia Chronic and stable on pured diet; will continue to monitor     Dezman Granda D. [name], MD

## 2017-09-06 ENCOUNTER — Encounter: Payer: Self-pay | Admitting: Internal Medicine

## 2017-09-06 NOTE — Assessment & Plan Note (Signed)
A1c was 6; withagoodcontrol;plantocontinuecurrentregimenwhichismetformin1000mg bymouthtwiceaday,Lantus15unitsnightlyandNovoLog4unitswitheachmeal.Patientisonstatin,patientisnotonanAceduetoallergybutmicroalbuminwas1.9

## 2017-09-06 NOTE — Assessment & Plan Note (Signed)
Chronic and stable on pured diet; will continue to monitor

## 2017-09-06 NOTE — Assessment & Plan Note (Signed)
Vitamin D 22; appears she is not on 50,000 units weekly; and vitamin order for that today

## 2017-09-06 NOTE — Assessment & Plan Note (Signed)
Stable and chronic; plan to continue Toprol-XL 25 mg by mouth daily and Lasix 20 mg by mouth daily

## 2017-09-06 NOTE — Assessment & Plan Note (Signed)
No reported exacerbation; continue dulera 2 puffs daily and guaifenesin 600 mg by mouth twice a day

## 2017-09-06 NOTE — Assessment & Plan Note (Signed)
Stable without any episodes of chest pain; plan to continue ASA 81 mg by mouth daily, Imdur 15 mg by mouth daily, Toprol-XL 25 mg by mouth daily, Plavix 75 mg by mouth daily, when necessary nitroglycerin sublingual and patient is on a statin

## 2017-09-10 DIAGNOSIS — F0390 Unspecified dementia without behavioral disturbance: Secondary | ICD-10-CM | POA: Diagnosis not present

## 2017-09-10 DIAGNOSIS — F419 Anxiety disorder, unspecified: Secondary | ICD-10-CM | POA: Diagnosis not present

## 2017-09-10 DIAGNOSIS — F331 Major depressive disorder, recurrent, moderate: Secondary | ICD-10-CM | POA: Diagnosis not present

## 2017-09-29 DIAGNOSIS — M545 Low back pain: Secondary | ICD-10-CM | POA: Diagnosis not present

## 2017-09-29 DIAGNOSIS — W19XXXA Unspecified fall, initial encounter: Secondary | ICD-10-CM | POA: Diagnosis not present

## 2017-10-12 DIAGNOSIS — Z9181 History of falling: Secondary | ICD-10-CM | POA: Diagnosis not present

## 2017-10-12 DIAGNOSIS — R1312 Dysphagia, oropharyngeal phase: Secondary | ICD-10-CM | POA: Diagnosis not present

## 2017-10-12 DIAGNOSIS — M6281 Muscle weakness (generalized): Secondary | ICD-10-CM | POA: Diagnosis not present

## 2017-10-16 ENCOUNTER — Encounter: Payer: Self-pay | Admitting: Internal Medicine

## 2017-10-16 ENCOUNTER — Non-Acute Institutional Stay (SKILLED_NURSING_FACILITY): Payer: Medicare Other | Admitting: Internal Medicine

## 2017-10-16 DIAGNOSIS — E785 Hyperlipidemia, unspecified: Secondary | ICD-10-CM | POA: Diagnosis not present

## 2017-10-16 DIAGNOSIS — F329 Major depressive disorder, single episode, unspecified: Secondary | ICD-10-CM | POA: Diagnosis not present

## 2017-10-16 DIAGNOSIS — I10 Essential (primary) hypertension: Secondary | ICD-10-CM | POA: Diagnosis not present

## 2017-10-16 DIAGNOSIS — F32A Depression, unspecified: Secondary | ICD-10-CM

## 2017-10-16 NOTE — Progress Notes (Signed)
Location:  [place] Room Number: [place] of Service:  SNF (31)  Hennie Duos, MD  Patient Care Team: Hennie Duos, MD as PCP - General (Internal Medicine)  Extended Emergency Contact Information Primary Emergency Contact: [name],[name] Address: [address]          [place], [place] [address] [name] of [place] Phone: [phone] Work Phone: [phone] Mobile Phone: [phone] Relation: Daughter Secondary Emergency Contact: [name],[name]  [place] Phone: [phone] Relation: Niece    Allergies: Lisinopril; Spironolactone; Vancomycin; Donepezil; Eggs or egg-derived products; Erythromycin; Macrolides and ketolides; Penicillins; Quinapril hcl; Angiotensin receptor blockers; Lipitor [atorvastatin]; Pentazocine lactate; Propoxyphene hcl; and Sulfonamide derivatives  Chief Complaint  Patient presents with  . Medical Management of Chronic Issues    routine visit  . Health Maintenance    orders written for flu, Prevnar vaccine and Tdap. draw lab for HgbA1c. Schedule patient with house doctors for diabetic foot and eye exam.     HPI: Patient is 76 y.o. female who is being seen for routine issues of hypertension, hyperlipidemia, and depression.  Past Medical History:  Diagnosis Date  . Allergy to ACE inhibitors 03/17/2015  . Anxiety   . CAD (coronary artery disease)    a. s/p inferior STEMI 12/29/10 with rotablator atherectomy RCA 12/31/10 and DES x 2 RCA;  b. Lexiscan Myoview (02/2015): mild reversible apical anterior perfusion defect. C. cath 02/2015 50% LAD lesion with patent stent, Tx Rx    . Cardiomyopathy, ischemic 03/17/2015  . Chronic diastolic CHF (congestive heart failure) ([place]) 11/12/2016  . COPD (chronic obstructive pulmonary disease) ([place])   . Coronary atherosclerosis of native coronary artery 04/03/2013  . Dementia   . Depression 03/14/2017  . Diabetes mellitus    type 2  . Diverticulosis     . DM2 (diabetes mellitus, type 2) ([place]) 04/02/2013  . Dysphagia 05/10/2017   Minimal 6/20 - pureed diet  . Fibromyalgia 04/20/2008   Qualifier: Diagnosis of  By: Nelson-Smith CMA (AAMA), Dottie    . GERD (gastroesophageal reflux disease)   . Glaucoma   . HTN (hypertension)   . Hyperlipidemia   . Ischemic cardiomyopathy    EF 45%cath 2012 EF55% 5/12; 35% 11/14  . Memory deficit   . Nephrolithiasis   . Obesity (BMI 30-39.9) 10/06/2015  . Osteoarthritis   . Overweight(278.02)   . Respiratory arrest//ACE Inhibitor presumed cause   . 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 Sleep apnea   . Small bowel obstruction (HCC)    from a sigmoid stricture  . Ventricular tachycardia ([place]) 03/17/2015  . Vitamin D deficiency 01/28/2017    Past Surgical History:  Procedure Laterality Date  . bilateral tubal ligation    . CARDIAC CATHETERIZATION N/A 03/18/2015   Procedure: LEFT HEART CATH AND CORONARY ANGIOGRAPHY;  Surgeon: Lorretta Harp, MD;  Location: [facility] CATH LAB;  Service: Cardiovascular;  Laterality: N/A;  . CARDIAC SURGERY    . HIP ARTHROPLASTY Right 11/10/2016   Procedure: ARTHROPLASTY BIPOLAR HIP (HEMIARTHROPLASTY);  Surgeon: Marchia Bond, MD;  Location: [place];  Service: Orthopedics;  Laterality: Right;  . 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 LITHOTRIPSY      Allergies as of 10/16/2017      Reactions   Lisinopril Other (See Comments)   Reaction:  Respiratory arrest    Spironolactone Anaphylaxis   Vancomycin Anaphylaxis, Rash   Donepezil Diarrhea   Eggs Or Egg-derived Products  Diarrhea   Erythromycin Other (See Comments)   Reaction:  Makes pt feel weird    Macrolides And Ketolides Other (See Comments)   Reaction:  Unknown    Penicillins Itching, Other (See Comments)   Has patient had a PCN reaction causing immediate rash, facial/tongue/throat swelling, SOB or lightheadedness with hypotension: No Has patient had a PCN reaction  causing severe rash involving mucus membranes or skin necrosis: No Has patient had a PCN reaction that required hospitalization No Has patient had a PCN reaction occurring within the last 10 years: No If all of the above answers are "NO", then may proceed with Cephalosporin use.   Quinapril Hcl Itching   Angiotensin Receptor Blockers Other (See Comments)   Reaction:  Unknown    Lipitor [atorvastatin] Palpitations   Pentazocine Lactate Other (See Comments)   Reaction:  Unknown    Propoxyphene Hcl Other (See Comments)   Reaction unknown   Sulfonamide Derivatives Other (See Comments)   Reaction:  Unknown       Medication List        Accurate as of 10/16/17 11:59 PM. Always use your most recent med list.          acetaminophen 500 MG tablet Commonly known as:  TYLENOL Take 500 mg by mouth. Take 2 tablets twice daily for pain   acetaminophen 325 MG tablet Commonly known as:  TYLENOL Take 2 tablets (650 mg total) by mouth every 6 (six) hours as needed.   albuterol 108 (90 Base) MCG/ACT inhaler Commonly known as:  PROVENTIL HFA;VENTOLIN HFA Inhale 2 puffs into the lungs every 6 (six) hours as needed for wheezing or shortness of breath.   ALPRAZolam 0.25 MG tablet Commonly known as:  XANAX Take 0.25 mg by mouth. Take one tablet twice daily for anxiety   aspirin EC 81 MG tablet Take 81 mg by mouth daily.   atorvastatin 40 MG tablet Commonly known as:  LIPITOR Take 40 mg by mouth daily.   clopidogrel 75 MG tablet Commonly known as:  PLAVIX Take 75 mg by mouth daily.   furosemide 20 MG tablet Commonly known as:  LASIX Take 20 mg by mouth daily.   HUMALOG KWIKPEN 100 UNIT/ML KiwkPen Generic drug:  insulin lispro Inject 4 Units into the skin 3 (three) times daily with meals.   HYDROcodone-acetaminophen 5-325 MG tablet Commonly known as:  NORCO/VICODIN Take 1 tablet by mouth every 6 (six) hours as needed for moderate pain.   insulin glargine 100 unit/mL Sopn Commonly  known as:  LANTUS Inject 0.15 mLs (15 Units total) into the skin at bedtime.   isosorbide mononitrate 30 MG 24 hr tablet Commonly known as:  IMDUR Take 0.5 tablets (15 mg total) by mouth daily.   memantine 10 MG tablet Commonly known as:  NAMENDA Take 10 mg by mouth 2 (two) times daily.   metFORMIN 1000 MG tablet Commonly known as:  GLUCOPHAGE Take 1,000 mg by mouth 2 (two) times daily with a meal.   metoprolol succinate 25 MG 24 hr tablet Commonly known as:  TOPROL XL Take 0.5 tablets (12.5 mg total) by mouth daily.   mirtazapine 15 MG disintegrating tablet Commonly known as:  REMERON SOL-TAB Take 1 tablet (15 mg total) by mouth at bedtime.   mometasone-formoterol 100-5 MCG/ACT Aero Commonly known as:  DULERA Inhale 2 puffs into the lungs 2 (two) times daily.   nitroGLYCERIN 0.4 MG SL tablet Commonly known as:  NITROSTAT Place 0.4 mg under the tongue every 5 (  five) minutes as needed for chest pain.   pantoprazole 40 MG tablet Commonly known as:  PROTONIX Take 40 mg by mouth daily.   polyethylene glycol packet Commonly known as:  MIRALAX / GLYCOLAX Take 17 g by mouth daily as needed for mild constipation.   sennosides-docusate sodium 8.6-50 MG tablet Commonly known as:  SENOKOT-S Take 2 tablets by mouth daily.   traZODone 50 MG tablet Commonly known as:  DESYREL Take 25 mg by mouth at bedtime.   Vitamin D3 50000 units Caps Take by mouth. Take 1 capsule every week for 16 weeks. Stop 12/28/17       No orders of the defined types were placed in this encounter.   Immunization History  Administered Date(s) Administered  . Influenza Split 08/20/2014  . Influenza, High Dose Seasonal PF 10/06/2013  . PPD Test 11/16/2016, 12/05/2016  . Pneumococcal Polysaccharide-23 09/24/2009    Social History   Tobacco Use  . Smoking status: Former Smoker    Packs/day: 1.00    Years: 25.00    Pack years: 25.00    Types: Cigarettes  . Smokeless tobacco: Never Used  .  Tobacco comment: over a ppd for 40+ years; quit 10/2004.  unsuccessfully tried eCigs.  Substance Use Topics  . Alcohol use: No    Alcohol/week: 0.5 oz    Types: 1 Standard drinks or equivalent per week    Review of Systems  DATA OBTAINED: from patient-limited nurse-no new concerns; GENERAL:  no fevers, fatigue, appetite changes SKIN: No itching, rash HEENT: No complaint RESPIRATORY: No cough, wheezing, SOB CARDIAC: No chest pain, palpitations, lower extremity edema  GI: No abdominal pain, No N/V/D or constipation, No heartburn or reflux  GU: No dysuria, frequency or urgency, or incontinence  MUSCULOSKELETAL: No unrelieved bone/joint pain NEUROLOGIC: No headache, dizziness  PSYCHIATRIC: No overt anxiety or sadness  Vitals:   10/16/17 1409  BP: 118/71  Pulse: 88  Resp: 19  Temp: 97.9 F (36.6 C)  SpO2: 99%   Body mass index is 24.17 kg/m. Physical Exam  GENERAL APPEARANCE: Alert,  minimally conversant, No acute distress  SKIN: No diaphoresis rash HEENT: Unremarkable RESPIRATORY: Breathing is even, unlabored. Lung sounds are clear   CARDIOVASCULAR: Heart RRR no murmurs, rubs or gallops. No peripheral edema  GASTROINTESTINAL: Abdomen is soft, non-tender, not distended w/ normal bowel sounds.  GENITOURINARY: Bladder non tender, not distended  MUSCULOSKELETAL: No abnormal joints or musculature NEUROLOGIC: Cranial nerves 2-12 grossly intact. Moves all extremities PSYCHIATRIC: Mood and affect with dementia, no behavioral issues  Patient Active Problem List   Diagnosis Date Noted  . GERD (gastroesophageal reflux disease) 08/04/2017  . Dysphagia 05/10/2017  . Depression 03/14/2017  . Vitamin D deficiency 01/28/2017  . UTI due to extended-spectrum beta lactamase (ESBL) producing Escherichia coli 11/18/2016  . Urinary tract infection due to Proteus 11/18/2016  . Postoperative anemia due to acute blood loss 11/18/2016  . HTN (hypertension) 11/18/2016  . Chronic diastolic CHF  (congestive heart failure) ([place]) 11/12/2016  . Closed right hip fracture ([place]) 11/10/2016  . 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 Obesity (BMI 30-39.9) 10/06/2015  . Tobacco use disorder 03/17/2015  . Cardiomyopathy, ischemic 03/17/2015  . Allergy to ACE inhibitors 03/17/2015  . Ventricular tachycardia ([place]) 03/17/2015  . HLD (hyperlipidemia) 12/31/2014  . COPD (chronic obstructive pulmonary disease) ([place]) 12/31/2014  . Chronic cough 09/12/2013  . Memory deficit 04/23/2013  . Essential hypertension, benign 04/03/2013  . Dyslipidemia 04/03/2013  . Coronary atherosclerosis of native coronary  artery 04/03/2013  . DM2 (diabetes mellitus, type 2) ([place]) 04/02/2013  . Fibromyalgia 04/20/2008    CMP     Component Value Date/Time   NA 144 04/04/2017   K 3.4 04/04/2017   CL 103 11/16/2016 0522   CO2 31 11/16/2016 0522   GLUCOSE 220 (H) 11/16/2016 0522   BUN 17 04/04/2017   CREATININE 0.7 04/04/2017   CREATININE 0.77 11/16/2016 0522   CALCIUM 8.2 (L) 11/16/2016 0522   PROT 7.7 04/27/2016 1724   ALBUMIN 4.4 04/27/2016 1724   AST 14 12/22/2016   ALT 8 12/22/2016   ALKPHOS 79 12/22/2016   BILITOT 1.2 04/27/2016 1724   GFRNONAA >60 11/16/2016 0522   GFRAA >60 11/16/2016 0522   Recent Labs    11/11/16 0228 11/12/16 0633 11/16/16 0522 12/22/16 04/04/17  NA 137  137 137  137 138  138 140 144  K 4.6  4.6 3.8  3.8 3.6 4.0 3.4  CL 101 102 103  --   --   CO2 24 26 31   --   --   GLUCOSE 227* 216* 220*  --   --   BUN 17  17 12  12 11  11 13 17   CREATININE 0.89  0.9 0.73  0.7 0.77  0.8 0.6 0.7  CALCIUM 8.4* 8.1* 8.2*  --   --    Recent Labs    12/22/16  AST 14  ALT 8  ALKPHOS 79   Recent Labs    11/10/16 0920  11/12/16 0633 11/13/16 0508 11/16/16 0522 12/22/16 04/04/17  WBC 13.0  13.0*   < > 11.3  11.3* 12.1  12.1* 5.9  8.9 9.5 11.5  NEUTROABS 10.3*  --   --   --   --   --   --   HGB 13.8  13.8   < > 9.9*  9.9* 10.2*  10.2* 10.2*  11.7* 11.6*  HCT 41.0  41   < > 30.0*  30* 31.1*  31* 31.4* 35* 36  MCV 98.1   < > 97.7 100.6* 100.3*  --   --   PLT 198  198   < > 169  169 135*  135* 200 181 199   < > = values in this interval not displayed.   Recent Labs    12/22/16  CHOL 117  LDLCALC 59  TRIG 183*   Lab Results  Component Value Date   MICROALBUR 1.9 06/22/2017   Lab Results  Component Value Date   TSH 3.76 12/22/2016   Lab Results  Component Value Date   HGBA1C 6.3 10/18/2017   Lab Results  Component Value Date   CHOL 117 12/22/2016   HDL 29 (A) 12/22/2016   LDLCALC 59 12/22/2016   TRIG 183 (A) 12/22/2016   CHOLHDL 2 12/19/2012    Significant Diagnostic Results in last 30 days:  No results found.  Assessment and Plan  Essential hypertension, benign Controlled; continue metoprolol 12.5 mg daily, Lasix 20 mg daily and imdur 15 mg daily  HLD (hyperlipidemia) LDL 59, HDL 29, stable; plan to continue Lipitor 40 mg by mouth daily  Depression Stable; continue Zoloft 25 mg by mouth daily at bedtime and Remeron 15 mg by mouth daily at bedtime     Webb Silversmith D. [name], MD

## 2017-10-16 NOTE — Progress Notes (Signed)
Location:  [place] Room Number: Box of Service:  SNF [phone])  PCP: Hennie Duos, MD Patient Care Team: Hennie Duos, MD as PCP - General (Internal Medicine)  Extended Emergency Contact Information Primary Emergency Contact: [name],[name] Address: [address]          [place], [place] [address] [name] of [place] Phone: [phone] Work Phone: [phone] Mobile Phone: [phone] Relation: Daughter Secondary Emergency Contact: [name],[name]  [place] Phone: [phone] Relation: Niece  Allergies  Allergen Reactions  . Lisinopril Other (See Comments)    Reaction:  Respiratory arrest   . Spironolactone Anaphylaxis  . Vancomycin Anaphylaxis and Rash  . Donepezil Diarrhea  . Eggs Or Egg-Derived Products Diarrhea  . Erythromycin Other (See Comments)    Reaction:  Makes pt feel weird   . Macrolides And Ketolides Other (See Comments)    Reaction:  Unknown   . Penicillins Itching and Other (See Comments)    Has patient had a PCN reaction causing immediate rash, facial/tongue/throat swelling, SOB or lightheadedness with hypotension: No Has patient had a PCN reaction causing severe rash involving mucus membranes or skin necrosis: No Has patient had a PCN reaction that required hospitalization No Has patient had a PCN reaction occurring within the last 10 years: No If all of the above answers are "NO", then may proceed with Cephalosporin use.  . Quinapril Hcl Itching  . Angiotensin Receptor Blockers Other (See Comments)    Reaction:  Unknown   . Lipitor [Atorvastatin] Palpitations  . Pentazocine Lactate Other (See Comments)    Reaction:  Unknown   . Propoxyphene Hcl Other (See Comments)    Reaction unknown  . Sulfonamide Derivatives Other (See Comments)    Reaction:  Unknown     Chief Complaint  Patient presents with  . Medical Management of Chronic Issues    routine visit  . Health  Maintenance    orders written for flu, Prevnar vaccine and Tdap. draw lab for HgbA1c. Schedule patient with house doctors for diabetic foot and eye exam.     HPI:  76 y.o. female      Past Medical History:  Diagnosis Date  . Allergy to ACE inhibitors 03/17/2015  . Anxiety   . CAD (coronary artery disease)    a. s/p inferior STEMI 12/29/10 with rotablator atherectomy RCA 12/31/10 and DES x 2 RCA;  b. Lexiscan Myoview (02/2015): mild reversible apical anterior perfusion defect. C. cath 02/2015 50% LAD lesion with patent stent, Tx Rx    . Cardiomyopathy, ischemic 03/17/2015  . Chronic diastolic CHF (congestive heart failure) ([place]) 11/12/2016  . COPD (chronic obstructive pulmonary disease) ([facility])   . Coronary atherosclerosis of native coronary artery 04/03/2013  . Dementia   . Depression 03/14/2017  . Diabetes mellitus    type 2  . Diverticulosis   . DM2 (diabetes mellitus, type 2) ([facility]) 04/02/2013  . Dysphagia 05/10/2017   Minimal 6/20 - pureed diet  . Fibromyalgia 04/20/2008   Qualifier: Diagnosis of  By: Nelson-Smith CMA (AAMA), Dottie    . GERD (gastroesophageal reflux disease)   . Glaucoma   . HTN (hypertension)   . Hyperlipidemia   . Ischemic cardiomyopathy    EF 45%cath 2012 EF55% 5/12; 35% 11/14  . Memory deficit   . Nephrolithiasis   . Obesity (BMI 30-39.9) 10/06/2015  . Osteoarthritis   . Overweight(278.02)   . Respiratory arrest//ACE Inhibitor presumed cause   . Senile dementia without  behavioral disturbance 10/06/2015  . Sleep apnea   . Small bowel obstruction (H[facility])    from a sigmoid stricture  . Ventricular tachycardia ([place]) 03/17/2015  . Vitamin D deficiency 01/28/2017    Past Surgical History:  Procedure Laterality Date  . bilateral tubal ligation    . CARDIAC CATHETERIZATION N/A 03/18/2015   Procedure: LEFT HEART CATH AND CORONARY ANGIOGRAPHY;  Surgeon: Lorretta Harp, MD;  Location: [facility] CATH LAB;  Service: Cardiovascular;  Laterality: N/A;  . CARDIAC SURGERY    .  HIP ARTHROPLASTY Right 11/10/2016   Procedure: ARTHROPLASTY BIPOLAR HIP (HEMIARTHROPLASTY);  Surgeon: Marchia Bond, MD;  Location: [place];  Service: Orthopedics;  Laterality: Right;  . 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 LITHOTRIPSY       reports that she has quit smoking. Her smoking use included cigarettes. She has a 25.00 pack-year smoking history. she has never used smokeless tobacco. She reports that she does not drink alcohol or use drugs. Social History   Socioeconomic History  . Marital status: Widowed    Spouse name: Not on file  . Number of children: 2  . Years of education: 89  . Highest education level: Not on file  Social Needs  . Financial resource strain: Not on file  . Food insecurity - worry: Not on file  . Food insecurity - inability: Not on file  . Transportation needs - medical: Not on file  . Transportation needs - non-medical: Not on file  Occupational History  . Occupation: Retired    Comment: Haematologist   Tobacco Use  . Smoking status: Former Smoker    Packs/day: 1.00    Years: 25.00    Pack years: 25.00    Types: Cigarettes  . Smokeless tobacco: Never Used  . Tobacco comment: over a ppd for 40+ years; quit 10/2004.  unsuccessfully tried eCigs.  Substance and Sexual Activity  . Alcohol use: No    Alcohol/week: 0.5 oz    Types: 1 Standard drinks or equivalent per week  . Drug use: No  . Sexual activity: No  Other Topics Concern  . Not on file  Social History Narrative   Admitted to [facility] 11/16/16   Patient is widowed with 2 children.   Patient is right handed.   Patient has 11 th grade education.   Former smoker-can't smoke since admitted to [facility]   Alcohol none   DNR    Pertinent  Health Maintenance Due  Topic Date Due  . FOOT EXAM  11/03/1951  . OPHTHALMOLOGY EXAM  11/03/1951  . PNA vac Low Risk Adult (2 of 2 - PCV13) 09/24/2010  . INFLUENZA VACCINE   06/20/2017  . HEMOGLOBIN A1C  06/21/2017  . URINE MICROALBUMIN  06/22/2018  . COLONOSCOPY  06/12/2027  . DEXA SCAN  Completed    Medications: Allergies as of 10/16/2017      Reactions   Lisinopril Other (See Comments)   Reaction:  Respiratory arrest    Spironolactone Anaphylaxis   Vancomycin Anaphylaxis, Rash   Donepezil Diarrhea   Eggs Or Egg-derived Products Diarrhea   Erythromycin Other (See Comments)   Reaction:  Makes pt feel weird    Macrolides And Ketolides Other (See Comments)   Reaction:  Unknown    Penicillins Itching, Other (See Comments)   Has patient had a PCN reaction causing immediate rash, facial/tongue/throat swelling, SOB or lightheadedness with hypotension: No Has patient had a PCN  reaction causing severe rash involving mucus membranes or skin necrosis: No Has patient had a PCN reaction that required hospitalization No Has patient had a PCN reaction occurring within the last 10 years: No If all of the above answers are "NO", then may proceed with Cephalosporin use.   Quinapril Hcl Itching   Angiotensin Receptor Blockers Other (See Comments)   Reaction:  Unknown    Lipitor [atorvastatin] Palpitations   Pentazocine Lactate Other (See Comments)   Reaction:  Unknown    Propoxyphene Hcl Other (See Comments)   Reaction unknown   Sulfonamide Derivatives Other (See Comments)   Reaction:  Unknown       Medication List        Accurate as of 10/16/17  2:30 PM. Always use your most recent med list.          acetaminophen 500 MG tablet Commonly known as:  TYLENOL Take 500 mg by mouth. Take 2 tablets twice daily for pain   acetaminophen 325 MG tablet Commonly known as:  TYLENOL Take 2 tablets (650 mg total) by mouth every 6 (six) hours as needed.   albuterol 108 (90 Base) MCG/ACT inhaler Commonly known as:  PROVENTIL HFA;VENTOLIN HFA Inhale 2 puffs into the lungs every 6 (six) hours as needed for wheezing or shortness of breath.   ALPRAZolam 0.25 MG  tablet Commonly known as:  XANAX Take 0.25 mg by mouth. Take one tablet twice daily for anxiety   aspirin EC 81 MG tablet Take 81 mg by mouth daily.   atorvastatin 40 MG tablet Commonly known as:  LIPITOR Take 40 mg by mouth daily.   clopidogrel 75 MG tablet Commonly known as:  PLAVIX Take 75 mg by mouth daily.   furosemide 20 MG tablet Commonly known as:  LASIX Take 20 mg by mouth daily.   HUMALOG KWIKPEN 100 UNIT/ML KiwkPen Generic drug:  insulin lispro Inject 4 Units into the skin 3 (three) times daily with meals.   HYDROcodone-acetaminophen 5-325 MG tablet Commonly known as:  NORCO/VICODIN Take 1 tablet by mouth every 6 (six) hours as needed for moderate pain.   insulin glargine 100 unit/mL Sopn Commonly known as:  LANTUS Inject 0.15 mLs (15 Units total) into the skin at bedtime.   isosorbide mononitrate 30 MG 24 hr tablet Commonly known as:  IMDUR Take 0.5 tablets (15 mg total) by mouth daily.   memantine 10 MG tablet Commonly known as:  NAMENDA Take 10 mg by mouth 2 (two) times daily.   metFORMIN 1000 MG tablet Commonly known as:  GLUCOPHAGE Take 1,000 mg by mouth 2 (two) times daily with a meal.   metoprolol succinate 25 MG 24 hr tablet Commonly known as:  TOPROL XL Take 0.5 tablets (12.5 mg total) by mouth daily.   mirtazapine 15 MG disintegrating tablet Commonly known as:  REMERON SOL-TAB Take 1 tablet (15 mg total) by mouth at bedtime.   mometasone-formoterol 100-5 MCG/ACT Aero Commonly known as:  DULERA Inhale 2 puffs into the lungs 2 (two) times daily.   nitroGLYCERIN 0.4 MG SL tablet Commonly known as:  NITROSTAT Place 0.4 mg under the tongue every 5 (five) minutes as needed for chest pain.   pantoprazole 40 MG tablet Commonly known as:  PROTONIX Take 40 mg by mouth daily.   polyethylene glycol packet Commonly known as:  MIRALAX / GLYCOLAX Take 17 g by mouth daily as needed for mild constipation.   sennosides-docusate sodium 8.6-50 MG  tablet Commonly known as:  SENOKOT-S Take  2 tablets by mouth daily.   traZODone 50 MG tablet Commonly known as:  DESYREL Take 25 mg by mouth at bedtime.   Vitamin D3 50000 units Caps Take by mouth. Take 1 capsule every week for 16 weeks. Stop 12/28/17        Vitals:   10/16/17 1409  BP: 118/71  Pulse: 88  Resp: 19  Temp: 97.9 F (36.6 C)  SpO2: 99%  Weight: 140 lb 12.8 oz (63.9 kg)  Height: 5\' 4"  (1.626 m)   Body mass index is 24.17 kg/m.  Physical Exam  GENERAL APPEARANCE: Alert, conversant. No acute distress.  HEENT: Unremarkable. RESPIRATORY: Breathing is even, unlabored. Lung sounds are clear   CARDIOVASCULAR: Heart RRR no murmurs, rubs or gallops. No peripheral edema.  GASTROINTESTINAL: Abdomen is soft, non-tender, not distended w/ normal bowel sounds.  NEUROLOGIC: Cranial nerves 2-12 grossly intact. Moves all extremities   Labs reviewed: Basic Metabolic Panel: Recent Labs    11/11/16 0228 11/12/16 0633 11/16/16 0522 12/22/16 04/04/17  NA 137  137 137  137 138  138 140 144  K 4.6  4.6 3.8  3.8 3.6 4.0 3.4  CL 101 102 103  --   --   CO2 24 26 31   --   --   GLUCOSE 227* 216* 220*  --   --   BUN 17  17 12  12 11  11 13 17   CREATININE 0.89  0.9 0.73  0.7 0.77  0.8 0.6 0.7  CALCIUM 8.4* 8.1* 8.2*  --   --    Lab Results  Component Value Date   MICROALBUR 1.9 06/22/2017   Liver Function Tests: Recent Labs    12/22/16  AST 14  ALT 8  ALKPHOS 79   No results for input(s): LIPASE, AMYLASE in the last 8760 hours. No results for input(s): AMMONIA in the last 8760 hours. CBC: Recent Labs    11/10/16 0920  11/12/16 0633 11/13/16 0508 11/16/16 0522 12/22/16 04/04/17  WBC 13.0  13.0*   < > 11.3  11.3* 12.1  12.1* 5.9  8.9 9.5 11.5  NEUTROABS 10.3*  --   --   --   --   --   --   HGB 13.8  13.8   < > 9.9*  9.9* 10.2*  10.2* 10.2* 11.7* 11.6*  HCT 41.0  41   < > 30.0*  30* 31.1*  31* 31.4* 35* 36  MCV 98.1   < > 97.7 100.6* 100.3*   --   --   PLT 198  198   < > 169  169 135*  135* 200 181 199   < > = values in this interval not displayed.   Lipid Recent Labs    12/22/16  CHOL 117  HDL 29*  LDLCALC 59  TRIG 183*   Cardiac Enzymes: No results for input(s): CKTOTAL, CKMB, CKMBINDEX, TROPONINI in the last 8760 hours. BNP: No results for input(s): BNP in the last 8760 hours. CBG: Recent Labs    11/15/16 2209 11/16/16 0653 11/16/16 1128  GLUCAP 209* 190* 201*    Procedures and Imaging Studies During Stay: No results found.  Assessment/Plan:   No diagnosis found.   Patient is being discharged with the following home health services:    Patient is being discharged with the following durable medical equipment:    Patient has been advised to f/u with their PCP in 1-2 weeks to bring them up to date on their rehab stay.  Social  services at facility was responsible for arranging this appointment.  Pt was provided with a 30 day supply of prescriptions for medications and refills must be obtained from their PCP.  For controlled substances, a more limited supply may be provided adequate until PCP appointment only.  Future labs/tests needed:

## 2017-10-18 LAB — HEMOGLOBIN A1C: Hemoglobin A1C: 6.3

## 2017-10-19 ENCOUNTER — Encounter: Payer: Self-pay | Admitting: Internal Medicine

## 2017-10-19 ENCOUNTER — Other Ambulatory Visit: Payer: Self-pay

## 2017-10-19 NOTE — Assessment & Plan Note (Signed)
LDL 59, HDL 29, stable; plan to continue Lipitor 40 mg by mouth daily

## 2017-10-19 NOTE — Assessment & Plan Note (Signed)
Controlled; continue metoprolol 12.5 mg daily, Lasix 20 mg daily and imdur 15 mg daily

## 2017-10-19 NOTE — Assessment & Plan Note (Signed)
Stable; continue Zoloft 25 mg by mouth daily at bedtime and Remeron 15 mg by mouth daily at bedtime

## 2017-10-22 DIAGNOSIS — Z9181 History of falling: Secondary | ICD-10-CM | POA: Diagnosis not present

## 2017-10-22 DIAGNOSIS — M6281 Muscle weakness (generalized): Secondary | ICD-10-CM | POA: Diagnosis not present

## 2017-10-23 ENCOUNTER — Other Ambulatory Visit: Payer: Self-pay

## 2017-10-23 DIAGNOSIS — E039 Hypothyroidism, unspecified: Secondary | ICD-10-CM | POA: Diagnosis not present

## 2017-10-23 LAB — BASIC METABOLIC PANEL
BUN: 19 (ref 4–21)
CREATININE: 0.6 (ref 0.5–1.1)
Glucose: 56

## 2017-10-23 LAB — CBC AND DIFFERENTIAL
HEMATOCRIT: 40 (ref 36–46)
HEMOGLOBIN: 13.4 (ref 12.0–16.0)
Platelets: 198 (ref 150–399)
WBC: 8

## 2017-10-23 LAB — VITAMIN D 25 HYDROXY (VIT D DEFICIENCY, FRACTURES): Vit D, 25-Hydroxy: 15.75

## 2017-10-23 LAB — LIPID PANEL
Cholesterol: 137 (ref 0–200)
HDL: 33 — AB (ref 35–70)
LDL Cholesterol: 78
Triglycerides: 132 (ref 40–160)

## 2017-10-23 LAB — HEPATIC FUNCTION PANEL
ALT: 5 — AB (ref 7–35)
AST: 15 (ref 13–35)
Alkaline Phosphatase: 107 (ref 25–125)
Bilirubin, Total: 0.4

## 2017-10-23 LAB — TSH: TSH: 2.85 (ref 0.41–5.90)

## 2017-11-07 ENCOUNTER — Non-Acute Institutional Stay (SKILLED_NURSING_FACILITY): Payer: Medicare Other | Admitting: Internal Medicine

## 2017-11-07 DIAGNOSIS — F0390 Unspecified dementia without behavioral disturbance: Secondary | ICD-10-CM | POA: Diagnosis not present

## 2017-11-07 DIAGNOSIS — K219 Gastro-esophageal reflux disease without esophagitis: Secondary | ICD-10-CM | POA: Diagnosis not present

## 2017-11-07 DIAGNOSIS — J449 Chronic obstructive pulmonary disease, unspecified: Secondary | ICD-10-CM | POA: Diagnosis not present

## 2017-11-08 ENCOUNTER — Encounter: Payer: Self-pay | Admitting: Internal Medicine

## 2017-11-08 NOTE — Progress Notes (Signed)
Location:  [facility] Room Number: [place]:  SNF [phone])  Hennie Duos, MD  Patient Care Team: Hennie Duos, MD as PCP - General (Internal Medicine)  Extended Emergency Contact Information Primary Emergency Contact: [name],[name] Address: [address]          [place], [place] [address] [name] of [place] Phone: [phone] Work Phone: [phone] Mobile Phone: [phone] Relation: Daughter Secondary Emergency Contact: [name],[name]  [place] Phone: [phone] Relation: Niece    Allergies: Lisinopril; Spironolactone; Vancomycin; Donepezil; Eggs or egg-derived products; Erythromycin; Macrolides and ketolides; Penicillins; Quinapril hcl; Angiotensin receptor blockers; Lipitor [atorvastatin]; Pentazocine lactate; Propoxyphene hcl; and Sulfonamide derivatives  Chief Complaint  Patient presents with  . Medical Management of Chronic Issues    routine visit    HPI: Patient is 76 y.o. female who is being seen for routine issues of dementia, GERD, and COPD.  Past Medical History:  Diagnosis Date  . Allergy to ACE inhibitors 03/17/2015  . Anxiety   . CAD (coronary artery disease)    a. s/p inferior STEMI 12/29/10 with rotablator atherectomy RCA 12/31/10 and DES x 2 RCA;  b. Lexiscan Myoview (02/2015): mild reversible apical anterior perfusion defect. C. cath 02/2015 50% LAD lesion with patent stent, Tx Rx    . Cardiomyopathy, ischemic 03/17/2015  . Chronic diastolic CHF (congestive heart failure) ([place]) 11/12/2016  . COPD (chronic obstructive pulmonary disease) ([place])   . Coronary atherosclerosis of native coronary artery 04/03/2013  . Dementia   . Depression 03/14/2017  . Diabetes mellitus    type 2  . Diverticulosis   . DM2 (diabetes mellitus, type 2) ([place]) 04/02/2013  . Dysphagia 05/10/2017   Minimal 6/20 - pureed diet  . Fibromyalgia 04/20/2008   Qualifier: Diagnosis of  By: Nelson-Smith CMA (AAMA),  Dottie    . GERD (gastroesophageal reflux disease)   . Glaucoma   . HTN (hypertension)   . Hyperlipidemia   . Ischemic cardiomyopathy    EF 45%cath 2012 EF55% 5/12; 35% 11/14  . Memory deficit   . Nephrolithiasis   . Obesity (BMI 30-39.9) 10/06/2015  . Osteoarthritis   . Overweight(278.02)   . Respiratory arrest//ACE Inhibitor presumed cause   . 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 Sleep apnea   . Small bowel obstruction (HCC)    from a sigmoid stricture  . Ventricular tachycardia ([place]) 03/17/2015  . Vitamin D deficiency 01/28/2017    Past Surgical History:  Procedure Laterality Date  . bilateral tubal ligation    . CARDIAC CATHETERIZATION N/A 03/18/2015   Procedure: LEFT HEART CATH AND CORONARY ANGIOGRAPHY;  Surgeon: Lorretta Harp, MD;  Location[facility] C[facility] LAB;  Service: Cardiovascular;  Laterality: N/A;  . CARDIAC SURGERY    . HIP ARTHROPLASTY Right 11/10/2016   Procedure: ARTHROPLASTY BIPOLAR HIP (HEMIARTHROPLASTY);  Surgeon: Marchia Bond, MD;  Location: [facility];  Service: Orthopedics;  Laterality: Right;  . 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 LITHOTRIPSY      Allergies as of 11/07/2017      Reactions   Lisinopril Other (See Comments)   Reaction:  Respiratory arrest    Spironolactone Anaphylaxis   Vancomycin Anaphylaxis, Rash   Donepezil Diarrhea   Eggs Or Egg-derived Products Diarrhea   Erythromycin Other (See Comments)   Reaction:  Makes pt feel weird    Macrolides And Ketolides Other (See Comments)   Reaction:  Unknown  Penicillins Itching, Other (See Comments)   Has patient had a PCN reaction causing immediate rash, facial/tongue/throat swelling, SOB or lightheadedness with hypotension: No Has patient had a PCN reaction causing severe rash involving mucus membranes or skin necrosis: No Has patient had a PCN reaction that required hospitalization No Has patient had a PCN reaction occurring within the last  10 years: No If all of the above answers are "NO", then may proceed with Cephalosporin use.   Quinapril Hcl Itching   Angiotensin Receptor Blockers Other (See Comments)   Reaction:  Unknown    Lipitor [atorvastatin] Palpitations   Pentazocine Lactate Other (See Comments)   Reaction:  Unknown    Propoxyphene Hcl Other (See Comments)   Reaction unknown   Sulfonamide Derivatives Other (See Comments)   Reaction:  Unknown       Medication List        Accurate as of 11/07/17 11:59 PM. Always use your most recent med list.          albuterol 108 (90 Base) MCG/ACT inhaler Commonly known as:  PROVENTIL HFA;VENTOLIN HFA Inhale 2 puffs into the lungs every 6 (six) hours as needed for wheezing or shortness of breath.   ALPRAZolam 0.25 MG tablet Commonly known as:  XANAX Take 0.25 mg by mouth 2 (two) times daily.   aspirin EC 81 MG tablet Take 81 mg by mouth daily.   atorvastatin 40 MG tablet Commonly known as:  LIPITOR Take 40 mg by mouth daily.   clopidogrel 75 MG tablet Commonly known as:  PLAVIX Take 75 mg by mouth daily.   furosemide 20 MG tablet Commonly known as:  LASIX Take 20 mg by mouth daily.   HUMALOG KWIKPEN 100 UNIT/ML KiwkPen Generic drug:  insulin lispro Inject 4 Units into the skin 3 (three) times daily with meals.   insulin glargine 100 unit/mL Sopn Commonly known as:  LANTUS Inject 0.15 mLs (15 Units total) into the skin at bedtime.   isosorbide mononitrate 30 MG 24 hr tablet Commonly known as:  IMDUR Take 0.5 tablets (15 mg total) by mouth daily.   memantine 10 MG tablet Commonly known as:  NAMENDA Take 10 mg by mouth 2 (two) times daily.   metFORMIN 1000 MG tablet Commonly known as:  GLUCOPHAGE Take 1,000 mg by mouth 2 (two) times daily with a meal.   metoprolol succinate 25 MG 24 hr tablet Commonly known as:  TOPROL XL Take 0.5 tablets (12.5 mg total) by mouth daily.   mirtazapine 15 MG disintegrating tablet Commonly known as:  REMERON  SOL-TAB Take 1 tablet (15 mg total) by mouth at bedtime.   mometasone-formoterol 100-5 MCG/ACT Aero Commonly known as:  DULERA Inhale 2 puffs into the lungs 2 (two) times daily.   nitroGLYCERIN 0.4 MG SL tablet Commonly known as:  NITROSTAT Place 0.4 mg under the tongue every 5 (five) minutes as needed for chest pain.   pantoprazole 40 MG tablet Commonly known as:  PROTONIX Take 40 mg by mouth daily.   polyethylene glycol packet Commonly known as:  MIRALAX / GLYCOLAX Take 17 g by mouth daily as needed for mild constipation.   sennosides-docusate sodium 8.6-50 MG tablet Commonly known as:  SENOKOT-S Take 2 tablets by mouth daily.   traMADol 50 MG tablet Commonly known as:  ULTRAM Take by mouth. Take one tablet twice daily for back pain   traZODone 50 MG tablet Commonly known as:  DESYREL Take 25 mg by mouth at bedtime.   Vitamin  D3 50000 units Caps Take 1 capsule by mouth once a week. 16 weeks. Stop 12/28/17       No orders of the defined types were placed in this encounter.   Immunization History  Administered Date(s) Administered  . Influenza Split 08/20/2014  . Influenza, High Dose Seasonal PF 10/06/2013  . Influenza-Unspecified 10/17/2017  . PPD Test 11/16/2016, 12/05/2016  . Pneumococcal Conjugate-13 10/17/2017  . Pneumococcal Polysaccharide-23 09/24/2009  . Tdap 10/18/2017    Social History   Tobacco Use  . Smoking status: Former Smoker    Packs/day: 1.00    Years: 25.00    Pack years: 25.00    Types: Cigarettes  . Smokeless tobacco: Never Used  . Tobacco comment: over a ppd for 40+ years; quit 10/2004.  unsuccessfully tried eCigs.  Substance Use Topics  . Alcohol use: No    Alcohol/week: 0.5 oz    Types: 1 Standard drinks or equivalent per week    Review of Systems  DATA OBTAINED: from patient-very limited; nursing-no acute concerns GENERAL:  no fevers, fatigue, appetite changes SKIN: No itching, rash HEENT: No complaint RESPIRATORY: No  cough, wheezing, SOB CARDIAC: No chest pain, palpitations, lower extremity edema  GI: No abdominal pain, No N/V/D or constipation, No heartburn or reflux  GU: No dysuria, frequency or urgency, or incontinence  MUSCULOSKELETAL: No unrelieved bone/joint pain NEUROLOGIC: No headache, dizziness  PSYCHIATRIC: No overt anxiety or sadness  Vitals:   11/07/17 2358  BP: 115/65  Pulse: 75  Resp: 18  Temp: 98 F (36.7 C)  SpO2: 93%   Body mass index is 22.83 kg/m. Physical Exam  GENERAL APPEARANCE: Alert, non-conversant, No acute distress; sitting at the nursing station  SKIN: No diaphoresis rash HEENT: Unremarkable RESPIRATORY: Breathing is even, unlabored. Lung sounds are clear   CARDIOVASCULAR: Heart RRR no murmurs, rubs or gallops. No peripheral edema  GASTROINTESTINAL: Abdomen is soft, non-tender, not distended w/ normal bowel sounds.  GENITOURINARY: Bladder non tender, not distended  MUSCULOSKELETAL: No abnormal joints or musculature NEUROLOGIC: Cranial nerves 2-12 grossly intact. Moves all extremities PSYCHIATRIC: Dementia, no behavioral issues  Patient Active Problem List   Diagnosis Date Noted  . GERD (gastroesophageal reflux disease) 08/04/2017  . Dysphagia 05/10/2017  . Depression 03/14/2017  . Vitamin D deficiency 01/28/2017  . UTI due to extended-spectrum beta lactamase (ESBL) producing Escherichia coli 11/18/2016  . Urinary tract infection due to Proteus 11/18/2016  . Postoperative anemia due to acute blood loss 11/18/2016  . HTN (hypertension) 11/18/2016  . Chronic diastolic CHF (congestive heart failure) ([place]) 11/12/2016  . Closed right hip fracture ([place]) 11/10/2016  . 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 Obesity (BMI 30-39.9) 10/06/2015  . Tobacco use disorder 03/17/2015  . Cardiomyopathy, ischemic 03/17/2015  . Allergy to ACE inhibitors 03/17/2015  . Ventricular tachycardia ([place]) 03/17/2015  . HLD (hyperlipidemia) 12/31/2014  . COPD  (chronic obstructive pulmonary disease) ([place]) 12/31/2014  . Chronic cough 09/12/2013  . Memory deficit 04/23/2013  . Essential hypertension, benign 04/03/2013  . Dyslipidemia 04/03/2013  . Coronary atherosclerosis of native coronary artery 04/03/2013  . DM2 (diabetes mellitus, type 2) ([place]) 04/02/2013  . Fibromyalgia 04/20/2008    CMP     Component Value Date/Time   NA 144 04/04/2017   K 3.4 04/04/2017   CL 103 11/16/2016 0522   CO2 31 11/16/2016 0522   GLUCOSE 220 (H) 11/16/2016 0522   BUN 19 10/23/2017   CREATININE 0.6 10/23/2017   CREATININE 0.77 11/16/2016 0522   CALCIUM  8.2 (L) 11/16/2016 0522   PROT 7.7 04/27/2016 1724   ALBUMIN 4.4 04/27/2016 1724   AST 15 10/23/2017   ALT 5 (A) 10/23/2017   ALKPHOS 107 10/23/2017   BILITOT 1.2 04/27/2016 1724   GFRNONAA >60 11/16/2016 0522   GFRAA >60 11/16/2016 0522   Recent Labs    12/22/16 04/04/17 10/23/17  NA 140 144  --   K 4.0 3.4  --   BUN 13 17 19   CREATININE 0.6 0.7 0.6   Recent Labs    12/22/16 10/23/17  AST 14 15  ALT 8 5*  ALKPHOS 79 107   Recent Labs    12/22/16 04/04/17 10/23/17  WBC 9.5 11.5 8.0  HGB 11.7* 11.6* 13.4  HCT 35* 36 40  PLT 181 199 198   Recent Labs    12/22/16 10/23/17  CHOL 117 137  LDLCALC 59 78  TRIG 183* 132   Lab Results  Component Value Date   MICROALBUR 1.9 06/22/2017   Lab Results  Component Value Date   TSH 2.85 10/23/2017   Lab Results  Component Value Date   HGBA1C 6.3 10/18/2017   Lab Results  Component Value Date   CHOL 137 10/23/2017   HDL 33 (A) 10/23/2017   LDLCALC 78 10/23/2017   TRIG 132 10/23/2017   CHOLHDL 2 12/19/2012    Significant Diagnostic Results in last 30 days:  No results found.  Assessment and Plan  Senile dementia without behavioral disturbance Chronic with some mild progression; plan to continue Namenda 10 mg by mouth twice a day  GERD (gastroesophageal reflux disease)  Reports of reflux; plan to continue Protonix 40 mg by  mouth daily  COLD (chronic obstructive lung disease) ([place]) Stable without exacerbation; plan to continue dULERA 2 puffs twice a day and when necessary albuterol    Noah Delaine. [name], MD

## 2017-11-22 DIAGNOSIS — E119 Type 2 diabetes mellitus without complications: Secondary | ICD-10-CM | POA: Diagnosis not present

## 2017-11-22 LAB — HEMOGLOBIN A1C: Hemoglobin A1C: 6

## 2017-12-04 ENCOUNTER — Non-Acute Institutional Stay (SKILLED_NURSING_FACILITY): Payer: Medicare Other | Admitting: Internal Medicine

## 2017-12-04 ENCOUNTER — Encounter: Payer: Self-pay | Admitting: Internal Medicine

## 2017-12-04 DIAGNOSIS — E785 Hyperlipidemia, unspecified: Secondary | ICD-10-CM | POA: Diagnosis not present

## 2017-12-04 DIAGNOSIS — K219 Gastro-esophageal reflux disease without esophagitis: Secondary | ICD-10-CM | POA: Diagnosis not present

## 2017-12-04 DIAGNOSIS — J449 Chronic obstructive pulmonary disease, unspecified: Secondary | ICD-10-CM | POA: Diagnosis not present

## 2017-12-04 DIAGNOSIS — F0390 Unspecified dementia without behavioral disturbance: Secondary | ICD-10-CM | POA: Diagnosis not present

## 2017-12-04 DIAGNOSIS — R102 Pelvic and perineal pain: Secondary | ICD-10-CM

## 2017-12-04 DIAGNOSIS — E119 Type 2 diabetes mellitus without complications: Secondary | ICD-10-CM | POA: Diagnosis not present

## 2017-12-04 DIAGNOSIS — I5032 Chronic diastolic (congestive) heart failure: Secondary | ICD-10-CM | POA: Diagnosis not present

## 2017-12-04 DIAGNOSIS — M797 Fibromyalgia: Secondary | ICD-10-CM | POA: Diagnosis not present

## 2017-12-04 DIAGNOSIS — I10 Essential (primary) hypertension: Secondary | ICD-10-CM | POA: Diagnosis not present

## 2017-12-04 NOTE — Progress Notes (Signed)
Location:  [place] Room Number: [place] of Service:  SNF (31)  Hennie Duos, MD  Patient Care Team: Hennie Duos, MD as PCP - General (Internal Medicine)  Extended Emergency Contact Information Primary Emergency Contact: [name],[name] Address: [address]          [place], [place] [address] [name] of [place] Phone: [phone] Work Phone: [phone] Mobile Phone: [phone] Relation: Daughter Secondary Emergency Contact: [name],[name]  [place] Phone: [phone] Relation: Niece    Allergies: Lisinopril; Spironolactone; Vancomycin; Donepezil; Eggs or egg-derived products; Erythromycin; Macrolides and ketolides; Penicillins; Quinapril hcl; Angiotensin receptor blockers; Lipitor [atorvastatin]; Pentazocine lactate; Propoxyphene hcl; and Sulfonamide derivatives  Chief Complaint  Patient presents with  . Acute Visit    Recheck patient complaining of suprapubic pain    HPI: Patient is 77 y.o. female who is being seen acutely today because she is complaining of abdominal pain. On exam her pain is suprapubic. She really can't understand anything like dysuria or urgency secondary to dementia. Per nursing no fever no chills no nausea no vomiting, patient is not in the bed she is in the hall in her wheelchair.  Past Medical History:  Diagnosis Date  . Allergy to ACE inhibitors 03/17/2015  . Anxiety   . CAD (coronary artery disease)    a. s/p inferior STEMI 12/29/10 with rotablator atherectomy RCA 12/31/10 and DES x 2 RCA;  b. Lexiscan Myoview (02/2015): mild reversible apical anterior perfusion defect. C. cath 02/2015 50% LAD lesion with patent stent, Tx Rx    . Cardiomyopathy, ischemic 03/17/2015  . Chronic diastolic CHF (congestive heart failure) ([place]) 11/12/2016  . COPD (chronic obstructive pulmonary disease) ([place])   . Coronary atherosclerosis of native coronary artery 04/03/2013  . Dementia   .  Depression 03/14/2017  . Diabetes mellitus    type 2  . Diverticulosis   . DM2 (diabetes mellitus, type 2) ([place]) 04/02/2013  . Dysphagia 05/10/2017   Minimal 6/20 - pureed diet  . Fibromyalgia 04/20/2008   Qualifier: Diagnosis of  By: Nelson-Smith CMA (AAMA), Dottie    . GERD (gastroesophageal reflux disease)   . Glaucoma   . HTN (hypertension)   . Hyperlipidemia   . Ischemic cardiomyopathy    EF 45%cath 2012 EF55% 5/12; 35% 11/14  . Memory deficit   . Nephrolithiasis   . Obesity (BMI 30-39.9) 10/06/2015  . Osteoarthritis   . Overweight(278.02)   . Respiratory arrest//ACE Inhibitor presumed cause   . 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 Sleep apnea   . Small bowel obstruction (HCC)    from a sigmoid stricture  . Ventricular tachycardia ([place]) 03/17/2015  . Vitamin D deficiency 01/28/2017    Past Surgical History:  Procedure Laterality Date  . bilateral tubal ligation    . CARDIAC CATHETERIZATION N/A 03/18/2015   Procedure: LEFT HEART CATH AND CORONARY ANGIOGRAPHY;  Surgeon: Lorretta Harp, MD;  Location: [facility] CATH LAB;  Service: Cardiovascular;  Laterality: N/A;  . CARDIAC SURGERY    . HIP ARTHROPLASTY Right 11/10/2016   Procedure: ARTHROPLASTY BIPOLAR HIP (HEMIARTHROPLASTY);  Surgeon: Marchia Bond, MD;  Location: [facility];  Service: Orthopedics;  Laterality: Right;  . 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 LITHOTRIPSY      Allergies as of 12/04/2017      Reactions   Lisinopril Other (See Comments)   Reaction:  Respiratory arrest    Spironolactone Anaphylaxis   Vancomycin  Anaphylaxis, Rash   Donepezil Diarrhea   Eggs Or Egg-derived Products Diarrhea   Erythromycin Other (See Comments)   Reaction:  Makes pt feel weird    Macrolides And Ketolides Other (See Comments)   Reaction:  Unknown    Penicillins Itching, Other (See Comments)   Has patient had a PCN reaction causing immediate rash, facial/tongue/throat swelling, SOB  or lightheadedness with hypotension: No Has patient had a PCN reaction causing severe rash involving mucus membranes or skin necrosis: No Has patient had a PCN reaction that required hospitalization No Has patient had a PCN reaction occurring within the last 10 years: No If all of the above answers are "NO", then may proceed with Cephalosporin use.   Quinapril Hcl Itching   Angiotensin Receptor Blockers Other (See Comments)   Reaction:  Unknown    Lipitor [atorvastatin] Palpitations   Pentazocine Lactate Other (See Comments)   Reaction:  Unknown    Propoxyphene Hcl Other (See Comments)   Reaction unknown   Sulfonamide Derivatives Other (See Comments)   Reaction:  Unknown       Medication List        Accurate as of 12/04/17  3:51 PM. Always use your most recent med list.          albuterol 108 (90 Base) MCG/ACT inhaler Commonly known as:  PROVENTIL HFA;VENTOLIN HFA Inhale 2 puffs into the lungs every 6 (six) hours as needed for wheezing or shortness of breath.   ALPRAZolam 0.25 MG tablet Commonly known as:  XANAX Take 0.25 mg by mouth 2 (two) times daily.   aspirin EC 81 MG tablet Take 81 mg by mouth daily.   atorvastatin 40 MG tablet Commonly known as:  LIPITOR Take 40 mg by mouth daily.   clopidogrel 75 MG tablet Commonly known as:  PLAVIX Take 75 mg by mouth daily.   furosemide 20 MG tablet Commonly known as:  LASIX Take 20 mg by mouth daily.   HUMALOG KWIKPEN 100 UNIT/ML KiwkPen Generic drug:  insulin lispro Inject 4 Units into the skin 3 (three) times daily with meals.   insulin glargine 100 unit/mL Sopn Commonly known as:  LANTUS Inject 0.15 mLs (15 Units total) into the skin at bedtime.   isosorbide mononitrate 30 MG 24 hr tablet Commonly known as:  IMDUR Take 0.5 tablets (15 mg total) by mouth daily.   memantine 10 MG tablet Commonly known as:  NAMENDA Take 10 mg by mouth 2 (two) times daily.   metFORMIN 1000 MG tablet Commonly known as:   GLUCOPHAGE Take 1,000 mg by mouth 2 (two) times daily with a meal.   metoprolol succinate 25 MG 24 hr tablet Commonly known as:  TOPROL XL Take 0.5 tablets (12.5 mg total) by mouth daily.   mirtazapine 15 MG disintegrating tablet Commonly known as:  REMERON SOL-TAB Take 1 tablet (15 mg total) by mouth at bedtime.   mometasone-formoterol 100-5 MCG/ACT Aero Commonly known as:  DULERA Inhale 2 puffs into the lungs 2 (two) times daily.   nitroGLYCERIN 0.4 MG SL tablet Commonly known as:  NITROSTAT Place 0.4 mg under the tongue every 5 (five) minutes as needed for chest pain.   pantoprazole 40 MG tablet Commonly known as:  PROTONIX Take 40 mg by mouth daily.   polyethylene glycol packet Commonly known as:  MIRALAX / GLYCOLAX Take 17 g by mouth daily as needed for mild constipation.   sennosides-docusate sodium 8.6-50 MG tablet Commonly known as:  SENOKOT-S Take 2 tablets by  mouth daily.   traMADol 50 MG tablet Commonly known as:  ULTRAM Take by mouth. Take one tablet twice daily for back pain   traZODone 50 MG tablet Commonly known as:  DESYREL Take 25 mg by mouth at bedtime.   Vitamin D3 50000 units Caps Take 1 capsule by mouth once a week. 16 weeks. Stop 12/28/17       No orders of the defined types were placed in this encounter.   Immunization History  Administered Date(s) Administered  . Influenza Split 08/20/2014  . Influenza, High Dose Seasonal PF 10/06/2013  . Influenza-Unspecified 10/17/2017  . PPD Test 11/16/2016, 12/05/2016  . Pneumococcal Conjugate-13 10/17/2017  . Pneumococcal Polysaccharide-23 09/24/2009  . Tdap 10/18/2017    Social History   Tobacco Use  . Smoking status: Former Smoker    Packs/day: 1.00    Years: 25.00    Pack years: 25.00    Types: Cigarettes  . Smokeless tobacco: Never Used  . Tobacco comment: over a ppd for 40+ years; quit 10/2004.  unsuccessfully tried eCigs.  Substance Use Topics  . Alcohol use: No    Alcohol/week:  0.5 oz    Types: 1 Standard drinks or equivalent per week    Review of Systems  DATA OBTAINED: from patient -very limited; nursing-concern for abdominal pain as per history of present illness GENERAL:  no fevers, fatigue, appetite changes SKIN: No itching, rash HEENT: No complaint RESPIRATORY: No cough, wheezing, SOB CARDIAC: No chest pain, palpitations, lower extremity edema  GI: No abdominal pain, No N/V/D or constipation, No heartburn or reflux  GU: No dysuria, frequency or urgency, or incontinence  MUSCULOSKELETAL: No unrelieved bone/joint pain NEUROLOGIC: No headache, dizziness  PSYCHIATRIC: No overt anxiety or sadness  Vitals:   12/04/17 1542  BP: 120/64  Pulse: 77  Resp: 16  Temp: 98 F (36.7 C)  SpO2: 94%   Body mass index is 22.14 kg/m. Physical Exam  GENERAL APPEARANCE: Alert, minimally conversant, No acute distress  SKIN: No diaphoresis rash HEENT: Unremarkable RESPIRATORY: Breathing is even, unlabored. Lung sounds are clear   CARDIOVASCULAR: Heart RRR no murmurs, rubs or gallops. No peripheral edema  GASTROINTESTINAL: Abdomen is soft, non-tender, not distended w/ normal bowel sounds.  GENITOURINARY: Bladder is tender tender, not distended  MUSCULOSKELETAL: No abnormal joints or musculature NEUROLOGIC: Cranial nerves 2-12 grossly intact. Moves all extremities PSYCHIATRIC: Mood and affect with dementia, no behavioral issues  Patient Active Problem List   Diagnosis Date Noted  . GERD (gastroesophageal reflux disease) 08/04/2017  . Dysphagia 05/10/2017  . Depression 03/14/2017  . Vitamin D deficiency 01/28/2017  . UTI due to extended-spectrum beta lactamase (ESBL) producing Escherichia coli 11/18/2016  . Urinary tract infection due to Proteus 11/18/2016  . Postoperative anemia due to acute blood loss 11/18/2016  . HTN (hypertension) 11/18/2016  . Chronic diastolic CHF (congestive heart failure) ([place]) 11/12/2016  . Closed right hip fracture ([place]) 11/10/2016   . 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 Obesity (BMI 30-39.9) 10/06/2015  . Tobacco use disorder 03/17/2015  . Cardiomyopathy, ischemic 03/17/2015  . Allergy to ACE inhibitors 03/17/2015  . Ventricular tachycardia ([place]) 03/17/2015  . HLD (hyperlipidemia) 12/31/2014  . COPD (chronic obstructive pulmonary disease) ([place]) 12/31/2014  . Chronic cough 09/12/2013  . Memory deficit 04/23/2013  . Essential hypertension, benign 04/03/2013  . Dyslipidemia 04/03/2013  . Coronary atherosclerosis of native coronary artery 04/03/2013  . DM2 (diabetes mellitus, type 2) ([place]) 04/02/2013  . Fibromyalgia 04/20/2008    CMP  Component Value Date/Time   NA 144 04/04/2017   K 3.4 04/04/2017   CL 103 11/16/2016 0522   CO2 31 11/16/2016 0522   GLUCOSE 220 (H) 11/16/2016 0522   BUN 19 10/23/2017   CREATININE 0.6 10/23/2017   CREATININE 0.77 11/16/2016 0522   CALCIUM 8.2 (L) 11/16/2016 0522   PROT 7.7 04/27/2016 1724   ALBUMIN 4.4 04/27/2016 1724   AST 15 10/23/2017   ALT 5 (A) 10/23/2017   ALKPHOS 107 10/23/2017   BILITOT 1.2 04/27/2016 1724   GFRNONAA >60 11/16/2016 0522   GFRAA >60 11/16/2016 0522   Recent Labs    12/22/16 04/04/17 10/23/17  NA 140 144  --   K 4.0 3.4  --   BUN 13 17 19   CREATININE 0.6 0.7 0.6   Recent Labs    12/22/16 10/23/17  AST 14 15  ALT 8 5*  ALKPHOS 79 107   Recent Labs    12/22/16 04/04/17 10/23/17  WBC 9.5 11.5 8.0  HGB 11.7* 11.6* 13.4  HCT 35* 36 40  PLT 181 199 198   Recent Labs    12/22/16 10/23/17  CHOL 117 137  LDLCALC 59 78  TRIG 183* 132   Lab Results  Component Value Date   MICROALBUR 1.9 06/22/2017   Lab Results  Component Value Date   TSH 2.85 10/23/2017   Lab Results  Component Value Date   HGBA1C 6.3 10/18/2017   Lab Results  Component Value Date   CHOL 137 10/23/2017   HDL 33 (A) 10/23/2017   LDLCALC 78 10/23/2017   TRIG 132 10/23/2017   CHOLHDL 2 12/19/2012    Significant Diagnostic  Results in last 30 days:  No results found.  Assessment and Plan  Suprapubic pain,  patient with dementia-: Clean-catch UA with C&S; monitor results    Noah Delaine. [name], MD

## 2017-12-12 ENCOUNTER — Encounter: Payer: Self-pay | Admitting: Internal Medicine

## 2017-12-12 NOTE — Assessment & Plan Note (Signed)
  Reports of reflux; plan to continue Protonix 40 mg by mouth daily

## 2017-12-12 NOTE — Assessment & Plan Note (Signed)
Stable without exacerbation; plan to continue dULERA 2 puffs twice a day and when necessary albuterol

## 2017-12-12 NOTE — Assessment & Plan Note (Signed)
Chronic with some mild progression; plan to continue Namenda 10 mg by mouth twice a day

## 2017-12-13 DIAGNOSIS — F331 Major depressive disorder, recurrent, moderate: Secondary | ICD-10-CM | POA: Diagnosis not present

## 2017-12-13 DIAGNOSIS — F0390 Unspecified dementia without behavioral disturbance: Secondary | ICD-10-CM | POA: Diagnosis not present

## 2017-12-13 DIAGNOSIS — F419 Anxiety disorder, unspecified: Secondary | ICD-10-CM | POA: Diagnosis not present

## 2017-12-14 ENCOUNTER — Non-Acute Institutional Stay (SKILLED_NURSING_FACILITY): Payer: Medicare Other | Admitting: Internal Medicine

## 2017-12-14 DIAGNOSIS — E119 Type 2 diabetes mellitus without complications: Secondary | ICD-10-CM | POA: Diagnosis not present

## 2017-12-14 DIAGNOSIS — R1312 Dysphagia, oropharyngeal phase: Secondary | ICD-10-CM | POA: Diagnosis not present

## 2017-12-14 DIAGNOSIS — I25118 Atherosclerotic heart disease of native coronary artery with other forms of angina pectoris: Secondary | ICD-10-CM | POA: Diagnosis not present

## 2017-12-16 ENCOUNTER — Encounter: Payer: Self-pay | Admitting: Internal Medicine

## 2017-12-20 ENCOUNTER — Encounter: Payer: Self-pay | Admitting: Internal Medicine

## 2017-12-20 DIAGNOSIS — F0390 Unspecified dementia without behavioral disturbance: Secondary | ICD-10-CM | POA: Diagnosis not present

## 2017-12-20 DIAGNOSIS — R1312 Dysphagia, oropharyngeal phase: Secondary | ICD-10-CM | POA: Diagnosis not present

## 2017-12-20 NOTE — Progress Notes (Signed)
Location:  [facility] of Service:  SNF (31)  Hennie Duos, MD  Patient Care Team: Hennie Duos, MD as PCP - General (Internal Medicine)  Extended Emergency Contact Information Primary Emergency Contact: [name],[name] Address: [address]          [place], [place] [address] [name] of [place] Phone: [phone] Work Phone: [phone] Mobile Phone: [phone] Relation: Daughter Secondary Emergency Contact: [name],[name]  [place] Phone: [phone] Relation: Niece    Allergies: Lisinopril; Spironolactone; Vancomycin; Donepezil; Eggs or egg-derived products; Erythromycin; Macrolides and ketolides; Penicillins; Quinapril hcl; Angiotensin receptor blockers; Lipitor [atorvastatin]; Pentazocine lactate; Propoxyphene hcl; and Sulfonamide derivatives  Chief Complaint  Patient presents with  . Medical Management of Chronic Issues    HPI: Patient is 77 y.o. female who is being seen for routine issues of coronary artery disease, dysphagia, and diabetes mellitus type 2.  Past Medical History:  Diagnosis Date  . Allergy to ACE inhibitors 03/17/2015  . Anxiety   . CAD (coronary artery disease)    a. s/p inferior STEMI 12/29/10 with rotablator atherectomy RCA 12/31/10 and DES x 2 RCA;  b. Lexiscan Myoview (02/2015): mild reversible apical anterior perfusion defect. C. cath 02/2015 50% LAD lesion with patent stent, Tx Rx    . Cardiomyopathy, ischemic 03/17/2015  . Chronic diastolic CHF (congestive heart failure) ([place]) 11/12/2016  . COPD (chronic obstructive pulmonary disease) ([place])   . Coronary atherosclerosis of native coronary artery 04/03/2013  . Dementia   . Depression 03/14/2017  . Diabetes mellitus    type 2  . Diverticulosis   . DM2 (diabetes mellitus, type 2) ([place]) 04/02/2013  . Dysphagia 05/10/2017   Minimal 6/20 - pureed diet  . Fibromyalgia 04/20/2008   Qualifier: Diagnosis of  By: Nelson-Smith CMA (AAMA),  Dottie    . GERD (gastroesophageal reflux disease)   . Glaucoma   . HTN (hypertension)   . Hyperlipidemia   . Ischemic cardiomyopathy    EF 45%cath 2012 EF55% 5/12; 35% 11/14  . Memory deficit   . Nephrolithiasis   . Obesity (BMI 30-39.9) 10/06/2015  . Osteoarthritis   . Overweight(278.02)   . Respiratory arrest//ACE Inhibitor presumed cause   . 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 Sleep apnea   . Small bowel obstruction (HCC)    from a sigmoid stricture  . Ventricular tachycardia ([place]) 03/17/2015  . Vitamin D deficiency 01/28/2017    Past Surgical History:  Procedure Laterality Date  . bilateral tubal ligation    . CARDIAC CATHETERIZATION N/A 03/18/2015   Procedure: LEFT HEART CATH AND CORONARY ANGIOGRAPHY;  Surgeon: Lorretta Harp, MD;  Location: [facility] CATH LAB;  Service: Cardiovascular;  Laterality: N/A;  . CARDIAC SURGERY    . HIP ARTHROPLASTY Right 11/10/2016   Procedure: ARTHROPLASTY BIPOLAR HIP (HEMIARTHROPLASTY);  Surgeon: Marchia Bond, MD;  Location: [facility];  Service: Orthopedics;  Laterality: Right;  . 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 LITHOTRIPSY      Allergies as of 12/14/2017      Reactions   Lisinopril Other (See Comments)   Reaction:  Respiratory arrest    Spironolactone Anaphylaxis   Vancomycin Anaphylaxis, Rash   Donepezil Diarrhea   Eggs Or Egg-derived Products Diarrhea   Erythromycin Other (See Comments)   Reaction:  Makes pt feel weird    Macrolides And Ketolides Other (See Comments)   Reaction:  Unknown    Penicillins Itching, Other (  See Comments)   Has patient had a PCN reaction causing immediate rash, facial/tongue/throat swelling, SOB or lightheadedness with hypotension: No Has patient had a PCN reaction causing severe rash involving mucus membranes or skin necrosis: No Has patient had a PCN reaction that required hospitalization No Has patient had a PCN reaction occurring within the last  10 years: No If all of the above answers are "NO", then may proceed with Cephalosporin use.   Quinapril Hcl Itching   Angiotensin Receptor Blockers Other (See Comments)   Reaction:  Unknown    Lipitor [atorvastatin] Palpitations   Pentazocine Lactate Other (See Comments)   Reaction:  Unknown    Propoxyphene Hcl Other (See Comments)   Reaction unknown   Sulfonamide Derivatives Other (See Comments)   Reaction:  Unknown       Medication List        Accurate as of 12/14/17 11:59 PM. Always use your most recent med list.          albuterol 108 (90 Base) MCG/ACT inhaler Commonly known as:  PROVENTIL HFA;VENTOLIN HFA Inhale 2 puffs into the lungs every 6 (six) hours as needed for wheezing or shortness of breath.   ALPRAZolam 0.25 MG tablet Commonly known as:  XANAX Take 0.25 mg by mouth 2 (two) times daily.   aspirin EC 81 MG tablet Take 81 mg by mouth daily.   atorvastatin 40 MG tablet Commonly known as:  LIPITOR Take 40 mg by mouth daily.   clopidogrel 75 MG tablet Commonly known as:  PLAVIX Take 75 mg by mouth daily.   furosemide 20 MG tablet Commonly known as:  LASIX Take 20 mg by mouth daily.   HUMALOG KWIKPEN 100 UNIT/ML KiwkPen Generic drug:  insulin lispro Inject 4 Units into the skin 3 (three) times daily with meals.   insulin glargine 100 unit/mL Sopn Commonly known as:  LANTUS Inject 0.15 mLs (15 Units total) into the skin at bedtime.   isosorbide mononitrate 30 MG 24 hr tablet Commonly known as:  IMDUR Take 0.5 tablets (15 mg total) by mouth daily.   memantine 10 MG tablet Commonly known as:  NAMENDA Take 10 mg by mouth 2 (two) times daily.   metFORMIN 1000 MG tablet Commonly known as:  GLUCOPHAGE Take 1,000 mg by mouth 2 (two) times daily with a meal.   metoprolol succinate 25 MG 24 hr tablet Commonly known as:  TOPROL XL Take 0.5 tablets (12.5 mg total) by mouth daily.   mirtazapine 15 MG disintegrating tablet Commonly known as:  REMERON  SOL-TAB Take 1 tablet (15 mg total) by mouth at bedtime.   mometasone-formoterol 100-5 MCG/ACT Aero Commonly known as:  DULERA Inhale 2 puffs into the lungs 2 (two) times daily.   nitroGLYCERIN 0.4 MG SL tablet Commonly known as:  NITROSTAT Place 0.4 mg under the tongue every 5 (five) minutes as needed for chest pain.   pantoprazole 40 MG tablet Commonly known as:  PROTONIX Take 40 mg by mouth daily.   polyethylene glycol packet Commonly known as:  MIRALAX / GLYCOLAX Take 17 g by mouth daily as needed for mild constipation.   sennosides-docusate sodium 8.6-50 MG tablet Commonly known as:  SENOKOT-S Take 2 tablets by mouth daily.   traMADol 50 MG tablet Commonly known as:  ULTRAM Take by mouth. Take one tablet twice daily for back pain   traZODone 50 MG tablet Commonly known as:  DESYREL Take 25 mg by mouth at bedtime.   Vitamin D3 50000 units  Caps Take 1 capsule by mouth once a week. 16 weeks. Stop 12/28/17       No orders of the defined types were placed in this encounter.   Immunization History  Administered Date(s) Administered  . Influenza Split 08/20/2014  . Influenza, High Dose Seasonal PF 10/06/2013  . Influenza-Unspecified 10/17/2017  . PPD Test 11/16/2016, 12/05/2016  . Pneumococcal Conjugate-13 10/17/2017  . Pneumococcal Polysaccharide-23 09/24/2009  . Tdap 10/18/2017    Social History   Tobacco Use  . Smoking status: Former Smoker    Packs/day: 1.00    Years: 25.00    Pack years: 25.00    Types: Cigarettes  . Smokeless tobacco: Never Used  . Tobacco comment: over a ppd for 40+ years; quit 10/2004.  unsuccessfully tried eCigs.  Substance Use Topics  . Alcohol use: No    Alcohol/week: 0.5 oz    Types: 1 Standard drinks or equivalent per week    Review of Systems  DATA OBTAINED: from patient-limited; nursing-no acute concerns GENERAL:  no fevers, fatigue, appetite changes SKIN: No itching, rash HEENT: No complaint RESPIRATORY: No cough,  wheezing, SOB CARDIAC: No chest pain, palpitations, lower extremity edema  GI: No abdominal pain, No N/V/D or constipation, No heartburn or reflux  GU: No dysuria, frequency or urgency, or incontinence  MUSCULOSKELETAL: No unrelieved bone/joint pain NEUROLOGIC: No headache, dizziness  PSYCHIATRIC: No overt anxiety or sadness  Vitals:   12/20/17 1656  BP: 120/60  Pulse: 77  Resp: 16  Temp: 98 F (36.7 C)   Body mass index is 22.14 kg/m. Physical Exam  GENERAL APPEARANCE: Alert, minimally conversant, No acute distress  SKIN: No diaphoresis rash HEENT: Unremarkable RESPIRATORY: Breathing is even, unlabored. Lung sounds are clear   CARDIOVASCULAR: Heart RRR no murmurs, rubs or gallops. No peripheral edema  GASTROINTESTINAL: Abdomen is soft, non-tender, not distended w/ normal bowel sounds.  GENITOURINARY: Bladder non tender, not distended  MUSCULOSKELETAL: No abnormal joints or musculature NEUROLOGIC: Cranial nerves 2-12 grossly intact. Moves all extremities PSYCHIATRIC: Mood and affect with dementia, no behavioral issues  Patient Active Problem List   Diagnosis Date Noted  . GERD (gastroesophageal reflux disease) 08/04/2017  . Dysphagia 05/10/2017  . Depression 03/14/2017  . Vitamin D deficiency 01/28/2017  . UTI due to extended-spectrum beta lactamase (ESBL) producing Escherichia coli 11/18/2016  . Urinary tract infection due to Proteus 11/18/2016  . Postoperative anemia due to acute blood loss 11/18/2016  . HTN (hypertension) 11/18/2016  . Chronic diastolic CHF (congestive heart failure) ([place]) 11/12/2016  . Closed right hip fracture ([place]) 11/10/2016  . 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 Obesity (BMI 30-39.9) 10/06/2015  . Tobacco use disorder 03/17/2015  . Cardiomyopathy, ischemic 03/17/2015  . Allergy to ACE inhibitors 03/17/2015  . Ventricular tachycardia ([employer]) 03/17/2015  . HLD (hyperlipidemia) 12/31/2014  . COPD (chronic obstructive  pulmonary disease) ([place]) 12/31/2014  . Chronic cough 09/12/2013  . Memory deficit 04/23/2013  . Essential hypertension, benign 04/03/2013  . Dyslipidemia 04/03/2013  . Coronary atherosclerosis of native coronary artery 04/03/2013  . DM2 (diabetes mellitus, type 2) ([place]) 04/02/2013  . Fibromyalgia 04/20/2008    CMP     Component Value Date/Time   NA 144 04/04/2017   K 3.4 04/04/2017   CL 103 11/16/2016 0522   CO2 31 11/16/2016 0522   GLUCOSE 220 (H) 11/16/2016 0522   BUN 19 10/23/2017   CREATININE 0.6 10/23/2017   CREATININE 0.77 11/16/2016 0522   CALCIUM 8.2 (L) 11/16/2016 0522   PROT  7.7 04/27/2016 1724   ALBUMIN 4.4 04/27/2016 1724   AST 15 10/23/2017   ALT 5 (A) 10/23/2017   ALKPHOS 107 10/23/2017   BILITOT 1.2 04/27/2016 1724   GFRNONAA >60 11/16/2016 0522   GFRAA >60 11/16/2016 0522   Recent Labs    12/22/16 04/04/17 10/23/17  NA 140 144  --   K 4.0 3.4  --   BUN 13 17 19   CREATININE 0.6 0.7 0.6   Recent Labs    12/22/16 10/23/17  AST 14 15  ALT 8 5*  ALKPHOS 79 107   Recent Labs    12/22/16 04/04/17 10/23/17  WBC 9.5 11.5 8.0  HGB 11.7* 11.6* 13.4  HCT 35* 36 40  PLT 181 199 198   Recent Labs    12/22/16 10/23/17  CHOL 117 137  LDLCALC 59 78  TRIG 183* 132   Lab Results  Component Value Date   MICROALBUR 1.9 06/22/2017   Lab Results  Component Value Date   TSH 2.85 10/23/2017   Lab Results  Component Value Date   HGBA1C 6.3 10/18/2017   Lab Results  Component Value Date   CHOL 137 10/23/2017   HDL 33 (A) 10/23/2017   LDLCALC 78 10/23/2017   TRIG 132 10/23/2017   CHOLHDL 2 12/19/2012    Significant Diagnostic Results in last 30 days:  No results found.  Assessment and Plan  Coronary atherosclerosis of native coronary artery Stable; continue Toprol-XL 12.5 mg by mouth daily, ASA 81 mg by mouth daily, Plavix 75 mg by mouth daily, Imdur 15 mg daily and when necessary sublingual nitroglycerin; patient is on a  statin  Dysphagia Stable on pured diet; plan to continue  Diabetes mellitus without complication (HCC) Most recent A1c is 6.3very good; plan to continue glucophage 1000 mg twice a day, Humalog insulin 15 units at bedtime and Humalog insulin 4 units with each meal; patient is on a statin    Inocencio Homes, MD

## 2017-12-20 NOTE — Assessment & Plan Note (Signed)
Most recent A1c is 6.3very good; plan to continue glucophage 1000 mg twice a day, Humalog insulin 15 units at bedtime and Humalog insulin 4 units with each meal; patient is on a statin

## 2017-12-20 NOTE — Assessment & Plan Note (Signed)
Stable on pured diet; plan to continue

## 2017-12-20 NOTE — Assessment & Plan Note (Signed)
Stable; continue Toprol-XL 12.5 mg by mouth daily, ASA 81 mg by mouth daily, Plavix 75 mg by mouth daily, Imdur 15 mg daily and when necessary sublingual nitroglycerin; patient is on a statin

## 2017-12-24 DIAGNOSIS — F0390 Unspecified dementia without behavioral disturbance: Secondary | ICD-10-CM | POA: Diagnosis not present

## 2017-12-24 DIAGNOSIS — R1312 Dysphagia, oropharyngeal phase: Secondary | ICD-10-CM | POA: Diagnosis not present

## 2018-01-24 ENCOUNTER — Non-Acute Institutional Stay (SKILLED_NURSING_FACILITY): Payer: Medicare Other | Admitting: Internal Medicine

## 2018-01-24 DIAGNOSIS — I10 Essential (primary) hypertension: Secondary | ICD-10-CM | POA: Diagnosis not present

## 2018-01-24 DIAGNOSIS — I5032 Chronic diastolic (congestive) heart failure: Secondary | ICD-10-CM

## 2018-01-24 DIAGNOSIS — F329 Major depressive disorder, single episode, unspecified: Secondary | ICD-10-CM

## 2018-01-24 DIAGNOSIS — F32A Depression, unspecified: Secondary | ICD-10-CM

## 2018-01-27 ENCOUNTER — Encounter: Payer: Self-pay | Admitting: Internal Medicine

## 2018-01-27 NOTE — Progress Notes (Signed)
Location:  [facility] of Service:  SNF (31)  Hennie Duos, MD  Patient Care Team: Hennie Duos, MD as PCP - General (Internal Medicine)  Extended Emergency Contact Information Primary Emergency Contact: [name],[name] Address: [address]          [place], [place] [address] [name] of [place] Phone: [phone] Work Phone: [phone] Mobile Phone: [phone] Relation: Daughter Secondary Emergency Contact: [name],[name]  [place] Phone: [phone] Relation: Niece    Allergies: Lisinopril; Spironolactone; Vancomycin; Donepezil; Eggs or egg-derived products; Erythromycin; Macrolides and ketolides; Penicillins; Quinapril hcl; Angiotensin receptor blockers; Lipitor [atorvastatin]; Pentazocine lactate; Propoxyphene hcl; and Sulfonamide derivatives  Chief Complaint  Patient presents with  . Medical Management of Chronic Issues    HPI: Patient is 77 y.o. female who is being seen for routine issues of congestive heart failure, depression, and hypertension.  Past Medical History:  Diagnosis Date  . Allergy to ACE inhibitors 03/17/2015  . Anxiety   . CAD (coronary artery disease)    a. s/p inferior STEMI 12/29/10 with rotablator atherectomy RCA 12/31/10 and DES x 2 RCA;  b. Lexiscan Myoview (02/2015): mild reversible apical anterior perfusion defect. C. cath 02/2015 50% LAD lesion with patent stent, Tx Rx    . Cardiomyopathy, ischemic 03/17/2015  . Chronic diastolic CHF (congestive heart failure) ([place]) 11/12/2016  . COPD (chronic obstructive pulmonary disease) ([place])   . Coronary atherosclerosis of native coronary artery 04/03/2013  . Dementia   . Depression 03/14/2017  . Diabetes mellitus    type 2  . Diverticulosis   . DM2 (diabetes mellitus, type 2) ([place]) 04/02/2013  . Dysphagia 05/10/2017   Minimal 6/20 - pureed diet  . Fibromyalgia 04/20/2008   Qualifier: Diagnosis of  By: Nelson-Smith CMA (AAMA), Dottie    .  GERD (gastroesophageal reflux disease)   . Glaucoma   . HTN (hypertension)   . Hyperlipidemia   . Ischemic cardiomyopathy    EF 45%cath 2012 EF55% 5/12; 35% 11/14  . Memory deficit   . Nephrolithiasis   . Obesity (BMI 30-39.9) 10/06/2015  . Osteoarthritis   . Overweight(278.02)   . Respiratory arrest//ACE Inhibitor presumed cause   . 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 Sleep apnea   . Small bowel obstruction (HCC)    from a sigmoid stricture  . Ventricular tachycardia ([place]) 03/17/2015  . Vitamin D deficiency 01/28/2017    Past Surgical History:  Procedure Laterality Date  . bilateral tubal ligation    . CARDIAC CATHETERIZATION N/A 03/18/2015   Procedure: LEFT HEART CATH AND CORONARY ANGIOGRAPHY;  Surgeon: Lorretta Harp, MD;  Location[facility] CATH LAB;  Service: Cardiovascular;  Laterality: N/A;  . CARDIAC SURGERY    . HIP ARTHROPLASTY Right 11/10/2016   Procedure: ARTHROPLASTY BIPOLAR HIP (HEMIARTHROPLASTY);  Surgeon: Marchia Bond, MD;  Location: [facility];  Service: Orthopedics;  Laterality: Right;  . 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 LITHOTRIPSY      Allergies as of 01/24/2018      Reactions   Lisinopril Other (See Comments)   Reaction:  Respiratory arrest    Spironolactone Anaphylaxis   Vancomycin Anaphylaxis, Rash   Donepezil Diarrhea   Eggs Or Egg-derived Products Diarrhea   Erythromycin Other (See Comments)   Reaction:  Makes pt feel weird    Macrolides And Ketolides Other (See Comments)   Reaction:  Unknown    Penicillins Itching, Other (See Comments)  Has patient had a PCN reaction causing immediate rash, facial/tongue/throat swelling, SOB or lightheadedness with hypotension: No Has patient had a PCN reaction causing severe rash involving mucus membranes or skin necrosis: No Has patient had a PCN reaction that required hospitalization No Has patient had a PCN reaction occurring within the last 10 years:  No If all of the above answers are "NO", then may proceed with Cephalosporin use.   Quinapril Hcl Itching   Angiotensin Receptor Blockers Other (See Comments)   Reaction:  Unknown    Lipitor [atorvastatin] Palpitations   Pentazocine Lactate Other (See Comments)   Reaction:  Unknown    Propoxyphene Hcl Other (See Comments)   Reaction unknown   Sulfonamide Derivatives Other (See Comments)   Reaction:  Unknown       Medication List        Accurate as of 01/24/18 11:59 PM. Always use your most recent med list.          albuterol 108 (90 Base) MCG/ACT inhaler Commonly known as:  PROVENTIL HFA;VENTOLIN HFA Inhale 2 puffs into the lungs every 6 (six) hours as needed for wheezing or shortness of breath.   ALPRAZolam 0.25 MG tablet Commonly known as:  XANAX Take 0.25 mg by mouth 2 (two) times daily.   aspirin EC 81 MG tablet Take 81 mg by mouth daily.   atorvastatin 40 MG tablet Commonly known as:  LIPITOR Take 40 mg by mouth daily.   clopidogrel 75 MG tablet Commonly known as:  PLAVIX Take 75 mg by mouth daily.   furosemide 20 MG tablet Commonly known as:  LASIX Take 20 mg by mouth daily.   HUMALOG KWIKPEN 100 UNIT/ML KiwkPen Generic drug:  insulin lispro Inject 4 Units into the skin 3 (three) times daily with meals.   insulin glargine 100 unit/mL Sopn Commonly known as:  LANTUS Inject 0.15 mLs (15 Units total) into the skin at bedtime.   isosorbide mononitrate 30 MG 24 hr tablet Commonly known as:  IMDUR Take 0.5 tablets (15 mg total) by mouth daily.   memantine 10 MG tablet Commonly known as:  NAMENDA Take 10 mg by mouth 2 (two) times daily.   metFORMIN 1000 MG tablet Commonly known as:  GLUCOPHAGE Take 1,000 mg by mouth 2 (two) times daily with a meal.   metoprolol succinate 25 MG 24 hr tablet Commonly known as:  TOPROL XL Take 0.5 tablets (12.5 mg total) by mouth daily.   mirtazapine 15 MG disintegrating tablet Commonly known as:  REMERON SOL-TAB Take  1 tablet (15 mg total) by mouth at bedtime.   mometasone-formoterol 100-5 MCG/ACT Aero Commonly known as:  DULERA Inhale 2 puffs into the lungs 2 (two) times daily.   nitroGLYCERIN 0.4 MG SL tablet Commonly known as:  NITROSTAT Place 0.4 mg under the tongue every 5 (five) minutes as needed for chest pain.   pantoprazole 40 MG tablet Commonly known as:  PROTONIX Take 40 mg by mouth daily.   polyethylene glycol packet Commonly known as:  MIRALAX / GLYCOLAX Take 17 g by mouth daily as needed for mild constipation.   sennosides-docusate sodium 8.6-50 MG tablet Commonly known as:  SENOKOT-S Take 2 tablets by mouth daily.   traMADol 50 MG tablet Commonly known as:  ULTRAM Take by mouth. Take one tablet twice daily for back pain   traZODone 50 MG tablet Commonly known as:  DESYREL Take 25 mg by mouth at bedtime.       No orders of the  defined types were placed in this encounter.   Immunization History  Administered Date(s) Administered  . Influenza Split 08/20/2014  . Influenza, High Dose Seasonal PF 10/06/2013  . Influenza-Unspecified 10/17/2017  . PPD Test 11/16/2016, 12/05/2016  . Pneumococcal Conjugate-13 10/17/2017  . Pneumococcal Polysaccharide-23 09/24/2009  . Tdap 10/18/2017    Social History   Tobacco Use  . Smoking status: Former Smoker    Packs/day: 1.00    Years: 25.00    Pack years: 25.00    Types: Cigarettes  . Smokeless tobacco: Never Used  . Tobacco comment: over a ppd for 40+ years; quit 10/2004.  unsuccessfully tried eCigs.  Substance Use Topics  . Alcohol use: No    Alcohol/week: 0.5 oz    Types: 1 Standard drinks or equivalent per week    Review of Systems  DATA OBTAINED: from patient- very limited; nursing-no concerns GENERAL:  no fevers, fatigue, appetite changes SKIN: No itching, rash HEENT: No complaint RESPIRATORY: No cough, wheezing, SOB CARDIAC: No chest pain, palpitations, lower extremity edema  GI: No abdominal pain, No  N/V/D or constipation, No heartburn or reflux  GU: No dysuria, frequency or urgency, or incontinence  MUSCULOSKELETAL: No unrelieved bone/joint pain NEUROLOGIC: No headache, dizziness  PSYCHIATRIC: No overt anxiety or sadness  Vitals:   01/27/18 1537  BP: 123/74  Pulse: 71  Resp: 19  Temp: 98.8 F (37.1 C)   There is no height or weight on file to calculate BMI. Physical Exam  GENERAL APPEARANCE: Alert, minimally conversant, No acute distress  SKIN: No diaphoresis rash HEENT: Unremarkable RESPIRATORY: Breathing is even, unlabored. Lung sounds are clear   CARDIOVASCULAR: Heart RRR no murmurs, rubs or gallops. No peripheral edema  GASTROINTESTINAL: Abdomen is soft, non-tender, not distended w/ normal bowel sounds.  GENITOURINARY: Bladder non tender, not distended  MUSCULOSKELETAL: No abnormal joints or musculature NEUROLOGIC: Cranial nerves 2-12 grossly intact. Moves all extremities PSYCHIATRIC: Mood and affect flat, dementia, no behavioral issues  Patient Active Problem List   Diagnosis Date Noted  . GERD (gastroesophageal reflux disease) 08/04/2017  . Dysphagia 05/10/2017  . Depression 03/14/2017  . Vitamin D deficiency 01/28/2017  . UTI due to extended-spectrum beta lactamase (ESBL) producing Escherichia coli 11/18/2016  . Urinary tract infection due to Proteus 11/18/2016  . Postoperative anemia due to acute blood loss 11/18/2016  . HTN (hypertension) 11/18/2016  . Chronic diastolic CHF (congestive heart failure) ([place]) 11/12/2016  . Closed right hip fracture ([place]) 11/10/2016  . 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 Obesity (BMI 30-39.9) 10/06/2015  . Tobacco use disorder 03/17/2015  . Cardiomyopathy, ischemic 03/17/2015  . Allergy to ACE inhibitors 03/17/2015  . Ventricular tachycardia ([place]) 03/17/2015  . HLD (hyperlipidemia) 12/31/2014  . COPD (chronic obstructive pulmonary disease) ([place]) 12/31/2014  . Chronic cough 09/12/2013  . Memory  deficit 04/23/2013  . Essential hypertension, benign 04/03/2013  . Dyslipidemia 04/03/2013  . Coronary atherosclerosis of native coronary artery 04/03/2013  . DM2 (diabetes mellitus, type 2) ([place]) 04/02/2013  . Fibromyalgia 04/20/2008    CMP     Component Value Date/Time   NA 144 04/04/2017   K 3.4 04/04/2017   CL 103 11/16/2016 0522   CO2 31 11/16/2016 0522   GLUCOSE 220 (H) 11/16/2016 0522   BUN 19 10/23/2017   CREATININE 0.6 10/23/2017   CREATININE 0.77 11/16/2016 0522   CALCIUM 8.2 (L) 11/16/2016 0522   PROT 7.7 04/27/2016 1724   ALBUMIN 4.4 04/27/2016 1724   AST 15 10/23/2017   ALT  5 (A) 10/23/2017   ALKPHOS 107 10/23/2017   BILITOT 1.2 04/27/2016 1724   GFRNONAA >60 11/16/2016 0522   GFRAA >60 11/16/2016 0522   Recent Labs    04/04/17 10/23/17  NA 144  --   K 3.4  --   BUN 17 19  CREATININE 0.7 0.6   Recent Labs    10/23/17  AST 15  ALT 5*  ALKPHOS 107   Recent Labs    04/04/17 10/23/17  WBC 11.5 8.0  HGB 11.6* 13.4  HCT 36 40  PLT 199 198   Recent Labs    10/23/17  CHOL 137  LDLCALC 78  TRIG 132   Lab Results  Component Value Date   MICROALBUR 1.9 06/22/2017   Lab Results  Component Value Date   TSH 2.85 10/23/2017   Lab Results  Component Value Date   HGBA1C 6.3 10/18/2017   Lab Results  Component Value Date   CHOL 137 10/23/2017   HDL 33 (A) 10/23/2017   LDLCALC 78 10/23/2017   TRIG 132 10/23/2017   CHOLHDL 2 12/19/2012    Significant Diagnostic Results in last 30 days:  No results found.  Assessment and Plan  Chronic diastolic CHF (congestive heart failure) (HCC) Stable; continue Toprol-XL 12.5 mg daily and Lasix 20 mg daily  Depression Appears stable; continue Remeron 15 mg by mouth daily and Zoloft 25 mg daily  Essential hypertension, benign Controlled; continue Lasix 20 mg daily and Toprol-XL 12.5 mg daily    Inocencio Homes, MD

## 2018-01-27 NOTE — Assessment & Plan Note (Signed)
Stable; continue Toprol-XL 12.5 mg daily and Lasix 20 mg daily

## 2018-01-27 NOTE — Assessment & Plan Note (Signed)
Appears stable; continue Remeron 15 mg by mouth daily and Zoloft 25 mg daily

## 2018-01-27 NOTE — Assessment & Plan Note (Signed)
Controlled; continue Lasix 20 mg daily and Toprol-XL 12.5 mg daily

## 2018-01-28 IMAGING — CR DG CHEST 2V
2 series · 2 of 2 positions shown · non-contrast
Comparison: 02/04/2016 and 01/01/2016

CLINICAL DATA: Shortness of breath.  Wheezing.  COPD.

EXAM:
CHEST  2 VIEW

[w chest lat]
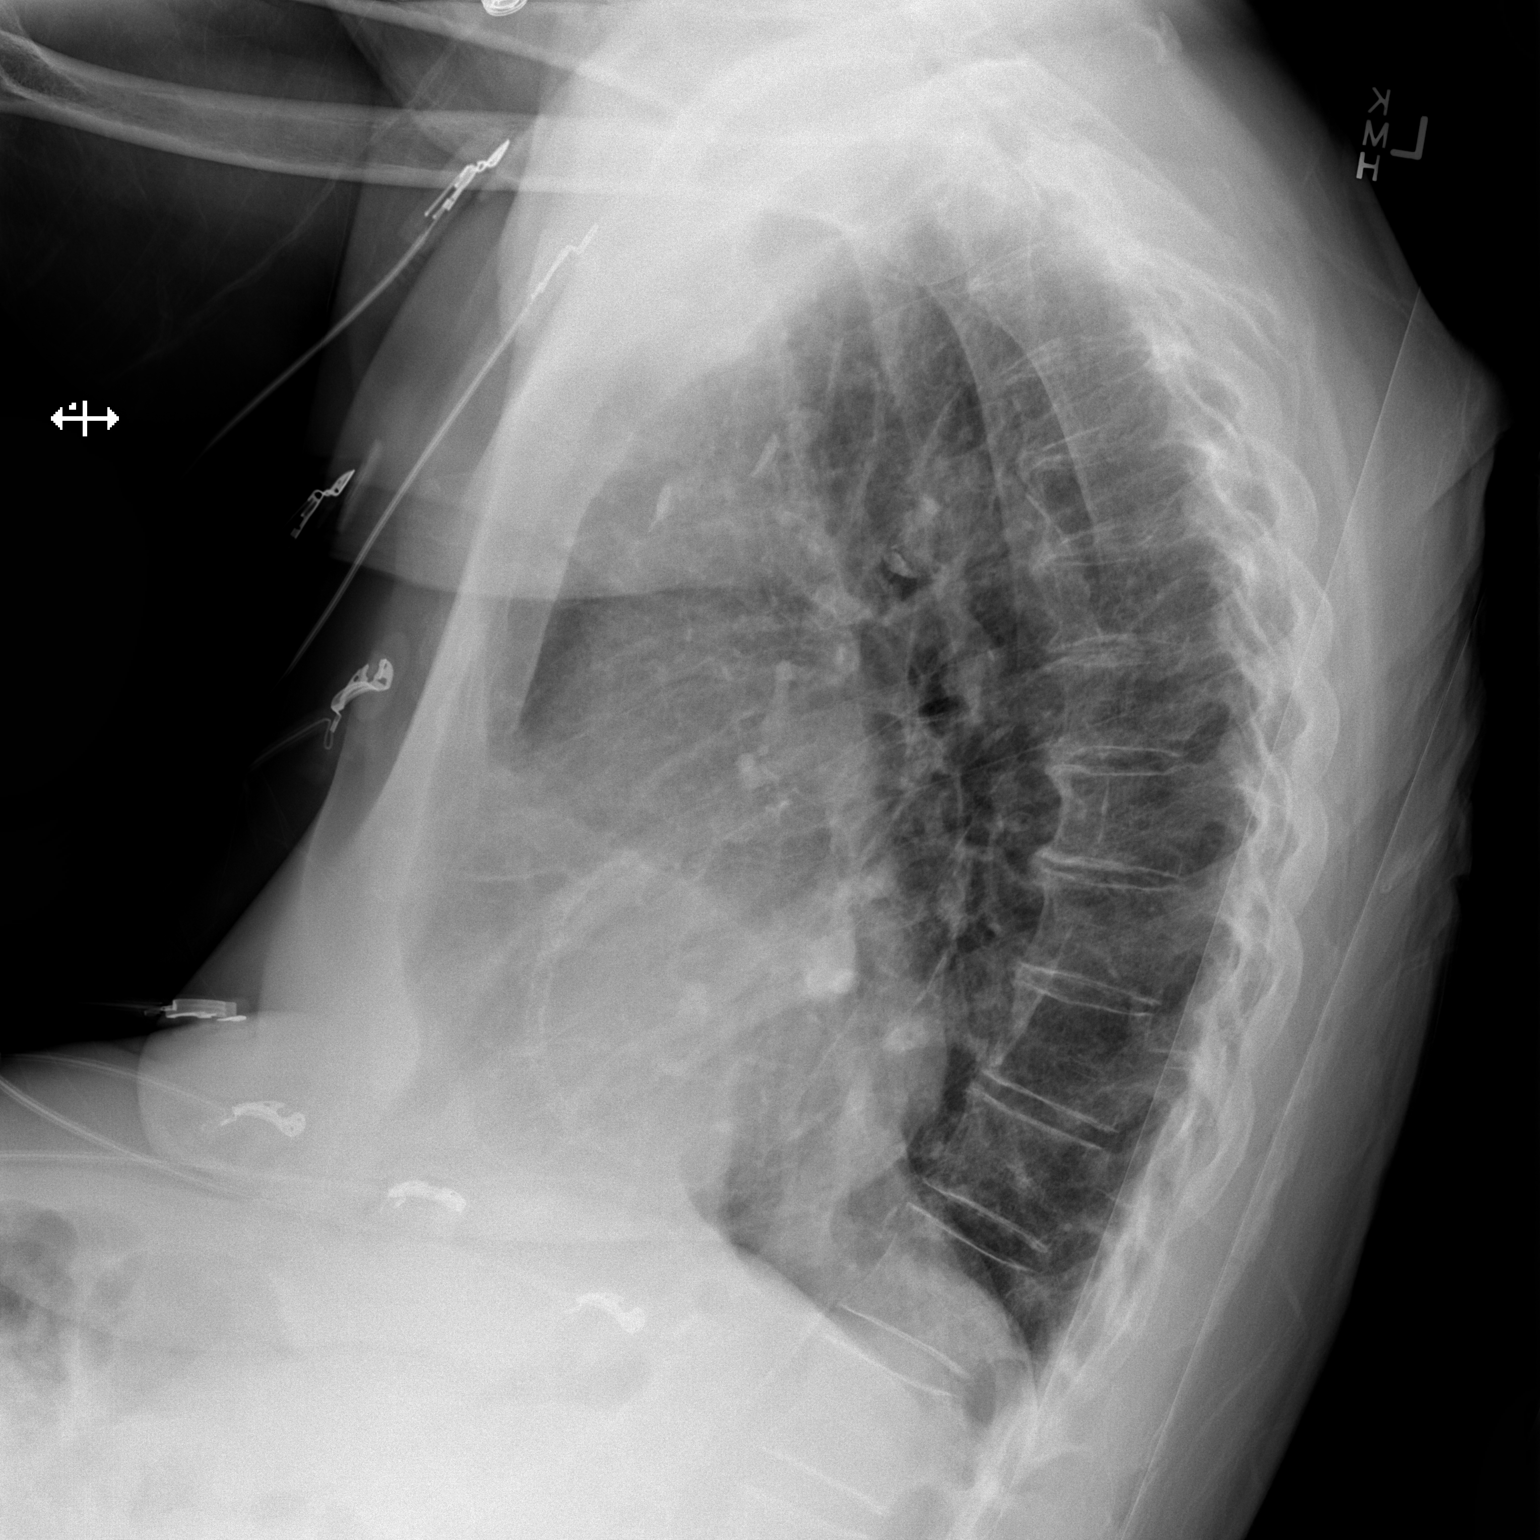

[x chest ap]
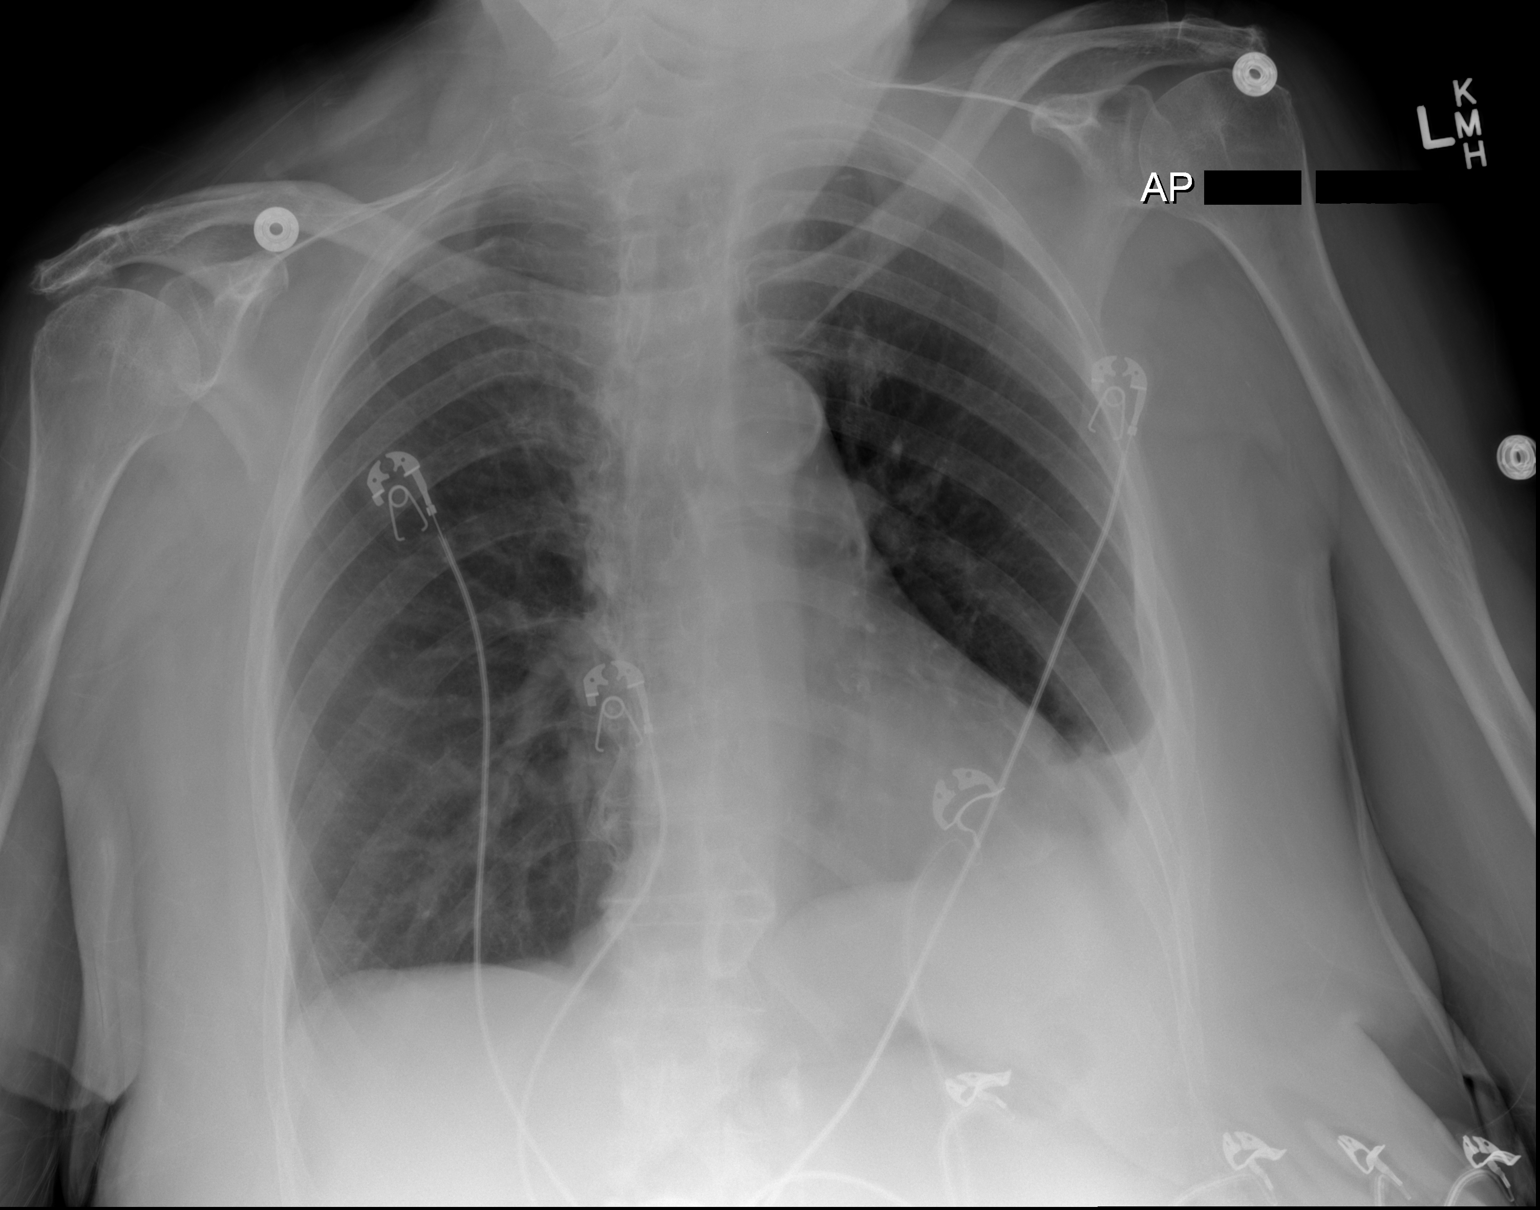

[2 of 2 positions shown; findings below may reference images not displayed]

FINDINGS: The heart size and mediastinal contours are within normal limits.
Aortic atherosclerosis noted.

Left basilar pleural thickening remains stable. No evidence of
pulmonary infiltrate or pleural effusion. No evidence of
pneumothorax.
IMPRESSION: Stable left basilar pleural thickening.  No active lung disease.

Aortic atherosclerosis.

## 2018-02-04 DIAGNOSIS — F419 Anxiety disorder, unspecified: Secondary | ICD-10-CM | POA: Diagnosis not present

## 2018-02-04 DIAGNOSIS — F331 Major depressive disorder, recurrent, moderate: Secondary | ICD-10-CM | POA: Diagnosis not present

## 2018-02-04 DIAGNOSIS — F0390 Unspecified dementia without behavioral disturbance: Secondary | ICD-10-CM | POA: Diagnosis not present

## 2018-03-18 ENCOUNTER — Non-Acute Institutional Stay (SKILLED_NURSING_FACILITY): Payer: Medicare Other | Admitting: Internal Medicine

## 2018-03-18 ENCOUNTER — Encounter: Payer: Self-pay | Admitting: Internal Medicine

## 2018-03-18 DIAGNOSIS — F0390 Unspecified dementia without behavioral disturbance: Secondary | ICD-10-CM | POA: Diagnosis not present

## 2018-03-18 DIAGNOSIS — E559 Vitamin D deficiency, unspecified: Secondary | ICD-10-CM

## 2018-03-18 DIAGNOSIS — E785 Hyperlipidemia, unspecified: Secondary | ICD-10-CM | POA: Diagnosis not present

## 2018-03-18 NOTE — Progress Notes (Signed)
Location:  [facility] Room Number: [place] of Service:  SNF (31)  Hennie Duos, MD  Patient Care Team: Hennie Duos, MD as PCP - General (Internal Medicine)  Extended Emergency Contact Information Primary Emergency Contact: [name],[name] Address: [address]          [place], [place] [address] [name] of [place] Phone: [phone] Work Phone: [phone] Mobile Phone: [phone] Relation: Daughter Secondary Emergency Contact: [name],[name]  [place] Phone: [phone] Relation: Niece    Allergies: Lisinopril; Spironolactone; Vancomycin; Donepezil; Eggs or egg-derived products; Erythromycin; Macrolides and ketolides; Penicillins; Quinapril hcl; Angiotensin receptor blockers; Lipitor [atorvastatin]; Pentazocine lactate; Propoxyphene hcl; and Sulfonamide derivatives  Chief Complaint  Patient presents with  . Medical Management of Chronic Issues    Routine Visit    HPI: Patient is 77 y.o. female who is being seen for routine issues of vitamin D deficiency, dementia, and hyperlipidemia.  Past Medical History:  Diagnosis Date  . Allergy to ACE inhibitors 03/17/2015  . Anxiety   . CAD (coronary artery disease)    a. s/p inferior STEMI 12/29/10 with rotablator atherectomy RCA 12/31/10 and DES x 2 RCA;  b. Lexiscan Myoview (02/2015): mild reversible apical anterior perfusion defect. C. cath 02/2015 50% LAD lesion with patent stent, Tx Rx    . Cardiomyopathy, ischemic 03/17/2015  . Chronic diastolic CHF (congestive heart failure) ([place]) 11/12/2016  . COPD (chronic obstructive pulmonary disease) ([place])   . Coronary atherosclerosis of native coronary artery 04/03/2013  . Dementia   . Depression 03/14/2017  . Diabetes mellitus    type 2  . Diverticulosis   . DM2 (diabetes mellitus, type 2) ([place]) 04/02/2013  . Dysphagia 05/10/2017   Minimal 6/20 - pureed diet  . Fibromyalgia 04/20/2008   Qualifier: Diagnosis of   By: Nelson-Smith CMA (AAMA), Dottie    . GERD (gastroesophageal reflux disease)   . Glaucoma   . HTN (hypertension)   . Hyperlipidemia   . Ischemic cardiomyopathy    EF 45%cath 2012 EF55% 5/12; 35% 11/14  . Memory deficit   . Nephrolithiasis   . Obesity (BMI 30-39.9) 10/06/2015  . Osteoarthritis   . Overweight(278.02)   . Respiratory arrest//ACE Inhibitor presumed cause   . 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 Sleep apnea   . Small bowel obstruction (HCC)    from a sigmoid stricture  . Ventricular tachycardia ([place]) 03/17/2015  . Vitamin D deficiency 01/28/2017    Past Surgical History:  Procedure Laterality Date  . bilateral tubal ligation    . CARDIAC CATHETERIZATION N/A 03/18/2015   Procedure: LEFT HEART CATH AND CORONARY ANGIOGRAPHY;  Surgeon: Lorretta Harp, MD;  Location: [facility] CATH LAB;  Service: Cardiovascular;  Laterality: N/A;  . CARDIAC SURGERY    . HIP ARTHROPLASTY Right 11/10/2016   Procedure: ARTHROPLASTY BIPOLAR HIP (HEMIARTHROPLASTY);  Surgeon: Marchia Bond, MD;  Location: [facility];  Service: Orthopedics;  Laterality: Right;  . 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 LITHOTRIPSY      Allergies as of 03/18/2018      Reactions   Lisinopril Other (See Comments)   Reaction:  Respiratory arrest    Spironolactone Anaphylaxis   Vancomycin Anaphylaxis, Rash   Donepezil Diarrhea   Eggs Or Egg-derived Products Diarrhea   Erythromycin Other (See Comments)   Reaction:  Makes pt feel weird    Macrolides And Ketolides Other (See Comments)   Reaction:  Unknown  Penicillins Itching, Other (See Comments)   Has patient had a PCN reaction causing immediate rash, facial/tongue/throat swelling, SOB or lightheadedness with hypotension: No Has patient had a PCN reaction causing severe rash involving mucus membranes or skin necrosis: No Has patient had a PCN reaction that required hospitalization No Has patient had a PCN  reaction occurring within the last 10 years: No If all of the above answers are "NO", then may proceed with Cephalosporin use.   Quinapril Hcl Itching   Angiotensin Receptor Blockers Other (See Comments)   Reaction:  Unknown    Lipitor [atorvastatin] Palpitations   Pentazocine Lactate Other (See Comments)   Reaction:  Unknown    Propoxyphene Hcl Other (See Comments)   Reaction unknown   Sulfonamide Derivatives Other (See Comments)   Reaction:  Unknown       Medication List        Accurate as of 03/18/18 11:59 PM. Always use your most recent med list.          albuterol 108 (90 Base) MCG/ACT inhaler Commonly known as:  PROVENTIL HFA;VENTOLIN HFA Inhale 2 puffs into the lungs every 6 (six) hours as needed for wheezing or shortness of breath.   ALPRAZolam 0.25 MG tablet Commonly known as:  XANAX Take 0.25 mg by mouth 2 (two) times daily.   aspirin EC 81 MG tablet Take 81 mg by mouth daily.   atorvastatin 40 MG tablet Commonly known as:  LIPITOR Take 40 mg by mouth daily.   clopidogrel 75 MG tablet Commonly known as:  PLAVIX Take 75 mg by mouth daily.   furosemide 20 MG tablet Commonly known as:  LASIX Take 20 mg by mouth daily.   HUMALOG KWIKPEN 100 UNIT/ML KiwkPen Generic drug:  insulin lispro Inject 4 Units into the skin 3 (three) times daily with meals.   insulin glargine 100 UNIT/ML injection Commonly known as:  LANTUS Inject 15 Units into the skin at bedtime. If blood sugar is > 150   isosorbide mononitrate 30 MG 24 hr tablet Commonly known as:  IMDUR Take 0.5 tablets (15 mg total) by mouth daily.   memantine 10 MG tablet Commonly known as:  NAMENDA Take 10 mg by mouth 2 (two) times daily.   metFORMIN 1000 MG tablet Commonly known as:  GLUCOPHAGE Take 1,000 mg by mouth 2 (two) times daily with a meal.   metoprolol succinate 25 MG 24 hr tablet Commonly known as:  TOPROL XL Take 0.5 tablets (12.5 mg total) by mouth daily.   mirtazapine 15 MG  disintegrating tablet Commonly known as:  REMERON SOL-TAB Take 1 tablet (15 mg total) by mouth at bedtime.   mometasone-formoterol 100-5 MCG/ACT Aero Commonly known as:  DULERA Inhale 2 puffs into the lungs 2 (two) times daily.   nitroGLYCERIN 0.4 MG SL tablet Commonly known as:  NITROSTAT Place 0.4 mg under the tongue every 5 (five) minutes as needed for chest pain.   pantoprazole 40 MG tablet Commonly known as:  PROTONIX Take 40 mg by mouth daily.   polyethylene glycol packet Commonly known as:  MIRALAX / GLYCOLAX Take 17 g by mouth daily as needed for mild constipation.   sennosides-docusate sodium 8.6-50 MG tablet Commonly known as:  SENOKOT-S Take 2 tablets by mouth daily.   traMADol 50 MG tablet Commonly known as:  ULTRAM Take 50 mg by mouth 2 (two) times daily. Back pain   traZODone 50 MG tablet Commonly known as:  DESYREL Take 25 mg by mouth at bedtime.  No orders of the defined types were placed in this encounter.   Immunization History  Administered Date(s) Administered  . Influenza Split 08/20/2014  . Influenza, High Dose Seasonal PF 10/06/2013  . Influenza-Unspecified 10/17/2017  . PPD Test 11/16/2016, 12/05/2016  . Pneumococcal Conjugate-13 10/17/2017  . Pneumococcal Polysaccharide-23 09/24/2009  . Tdap 10/18/2017    Social History   Tobacco Use  . Smoking status: Former Smoker    Packs/day: 1.00    Years: 25.00    Pack years: 25.00    Types: Cigarettes  . Smokeless tobacco: Never Used  . Tobacco comment: over a ppd for 40+ years; quit 10/2004.  unsuccessfully tried eCigs.  Substance Use Topics  . Alcohol use: No    Alcohol/week: 0.5 oz    Types: 1 Standard drinks or equivalent per week    Review of Systems  DATA OBTAINED: from patient-very limited; nursing-no acute concerns GENERAL:  no fevers, fatigue, appetite changes SKIN: No itching, rash HEENT: No complaint RESPIRATORY: No cough, wheezing, SOB CARDIAC: No chest pain,  palpitations, lower extremity edema  GI: No abdominal pain, No N/V/D or constipation, No heartburn or reflux  GU: No dysuria, frequency or urgency, or incontinence  MUSCULOSKELETAL: No unrelieved bone/joint pain NEUROLOGIC: No headache, dizziness  PSYCHIATRIC: No overt anxiety or sadness  Vitals:   03/18/18 1054  BP: 131/64  Pulse: 80  Resp: 20  Temp: (!) 97.5 F (36.4 C)  SpO2: 96%   Body mass index is 21.49 kg/m. Physical Exam  GENERAL APPEARANCE: Alert, conversant, No acute distress  SKIN: No diaphoresis rash HEENT: Unremarkable RESPIRATORY: Breathing is even, unlabored. Lung sounds are clear   CARDIOVASCULAR: Heart RRR no murmurs, rubs or gallops. No peripheral edema  GASTROINTESTINAL: Abdomen is soft, non-tender, not distended w/ normal bowel sounds.  GENITOURINARY: Bladder non tender, not distended  MUSCULOSKELETAL: No abnormal joints or musculature NEUROLOGIC: Cranial nerves 2-12 grossly intact. Moves all extremities PSYCHIATRIC: Dementia, no behavioral issues  Patient Active Problem List   Diagnosis Date Noted  . GERD (gastroesophageal reflux disease) 08/04/2017  . Dysphagia 05/10/2017  . Depression 03/14/2017  . Vitamin D deficiency 01/28/2017  . UTI due to extended-spectrum beta lactamase (ESBL) producing Escherichia coli 11/18/2016  . Urinary tract infection due to Proteus 11/18/2016  . Postoperative anemia due to acute blood loss 11/18/2016  . HTN (hypertension) 11/18/2016  . Chronic diastolic CHF (congestive heart failure) ([place]) 11/12/2016  . Closed right hip fracture ([place]) 11/10/2016  . 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 Obesity (BMI 30-39.9) 10/06/2015  . Tobacco use disorder 03/17/2015  . Cardiomyopathy, ischemic 03/17/2015  . Allergy to ACE inhibitors 03/17/2015  . Ventricular tachycardia ([place]) 03/17/2015  . HLD (hyperlipidemia) 12/31/2014  . COPD (chronic obstructive pulmonary disease) ([place]) 12/31/2014  . Chronic cough  09/12/2013  . Memory deficit 04/23/2013  . Essential hypertension, benign 04/03/2013  . Dyslipidemia 04/03/2013  . Coronary atherosclerosis of native coronary artery 04/03/2013  . DM2 (diabetes mellitus, type 2) ([place]) 04/02/2013  . Fibromyalgia 04/20/2008    CMP     Component Value Date/Time   NA 144 04/04/2017   K 3.4 04/04/2017   CL 103 11/16/2016 0522   CO2 31 11/16/2016 0522   GLUCOSE 220 (H) 11/16/2016 0522   BUN 19 10/23/2017   CREATININE 0.6 10/23/2017   CREATININE 0.77 11/16/2016 0522   CALCIUM 8.2 (L) 11/16/2016 0522   PROT 7.7 04/27/2016 1724   ALBUMIN 4.4 04/27/2016 1724   AST 15 10/23/2017   ALT 5 (A)  10/23/2017   ALKPHOS 107 10/23/2017   BILITOT 1.2 04/27/2016 1724   GFRNONAA >60 11/16/2016 0522   GFRAA >60 11/16/2016 0522   Recent Labs    10/23/17  BUN 19  CREATININE 0.6   Recent Labs    10/23/17  AST 15  ALT 5*  ALKPHOS 107   Recent Labs    10/23/17  WBC 8.0  HGB 13.4  HCT 40  PLT 198   Recent Labs    10/23/17  CHOL 137  LDLCALC 78  TRIG 132   Lab Results  Component Value Date   MICROALBUR 1.9 06/22/2017   Lab Results  Component Value Date   TSH 2.85 10/23/2017   Lab Results  Component Value Date   HGBA1C 6.0 11/22/2017   Lab Results  Component Value Date   CHOL 137 10/23/2017   HDL 33 (A) 10/23/2017   LDLCALC 78 10/23/2017   TRIG 132 10/23/2017   CHOLHDL 2 12/19/2012    Significant Diagnostic Results in last 30 days:  No results found.  Assessment and Plan  Vitamin D deficiency Vitamin D level 15; patient is not on vitamin D despite it being ordered; will order again 50,000 units weekly  Senile dementia without behavioral disturbance Recently patient has had some declines; continue Namenda 10 mg twice daily  HLD (hyperlipidemia) LDL 78, HDL 33; continue Lipitor 40 mg daily     Bryann Gentz D. [name], MD

## 2018-04-13 ENCOUNTER — Encounter: Payer: Self-pay | Admitting: Internal Medicine

## 2018-04-13 NOTE — Assessment & Plan Note (Signed)
Recently patient has had some declines; continue Namenda 10 mg twice daily

## 2018-04-13 NOTE — Assessment & Plan Note (Signed)
LDL 78, HDL 33; continue Lipitor 40 mg daily

## 2018-04-13 NOTE — Assessment & Plan Note (Signed)
Vitamin D level 15; patient is not on vitamin D despite it being ordered; will order again 50,000 units weekly

## 2018-04-16 ENCOUNTER — Encounter: Payer: Self-pay | Admitting: Internal Medicine

## 2018-04-16 ENCOUNTER — Non-Acute Institutional Stay (SKILLED_NURSING_FACILITY): Payer: Medicare Other | Admitting: Internal Medicine

## 2018-04-16 DIAGNOSIS — I10 Essential (primary) hypertension: Secondary | ICD-10-CM

## 2018-04-16 DIAGNOSIS — I5032 Chronic diastolic (congestive) heart failure: Secondary | ICD-10-CM

## 2018-04-16 DIAGNOSIS — I25118 Atherosclerotic heart disease of native coronary artery with other forms of angina pectoris: Secondary | ICD-10-CM | POA: Diagnosis not present

## 2018-04-16 NOTE — Progress Notes (Signed)
Location:  [facility] Room Number: [place] of Service:  SNF (31)  Hennie Duos, MD  Patient Care Team: Hennie Duos, MD as PCP - General (Internal Medicine)  Extended Emergency Contact Information Primary Emergency Contact: [name],[name] Address: [address]          [place], [place] [address] [name] of [place] Phone: [phone] Work Phone: [phone] Mobile Phone: [phone] Relation: Daughter Secondary Emergency Contact: [name],[name]  [place] Phone: [phone] Relation: Niece    Allergies: Lisinopril; Spironolactone; Vancomycin; Donepezil; Eggs or egg-derived products; Erythromycin; Macrolides and ketolides; Penicillins; Quinapril hcl; Angiotensin receptor blockers; Lipitor [atorvastatin]; Pentazocine lactate; Propoxyphene hcl; and Sulfonamide derivatives  Chief Complaint  Patient presents with  . Medical Management of Chronic Issues    Routine Visit    HPI: Patient is 77 y.o. female who is being seen for routine issues of hypertension, coronary artery disease, and chronic diastolic congestive heart failure.  Past Medical History:  Diagnosis Date  . Allergy to ACE inhibitors 03/17/2015  . Anxiety   . CAD (coronary artery disease)    a. s/p inferior STEMI 12/29/10 with rotablator atherectomy RCA 12/31/10 and DES x 2 RCA;  b. Lexiscan Myoview (02/2015): mild reversible apical anterior perfusion defect. C. cath 02/2015 50% LAD lesion with patent stent, Tx Rx    . Cardiomyopathy, ischemic 03/17/2015  . Chronic diastolic CHF (congestive heart failure) ([place]) 11/12/2016  . COPD (chronic obstructive pulmonary disease) ([place])   . Coronary atherosclerosis of native coronary artery 04/03/2013  . Dementia   . Depression 03/14/2017  . Diabetes mellitus    type 2  . Diverticulosis   . DM2 (diabetes mellitus, type 2) ([place]) 04/02/2013  . Dysphagia 05/10/2017   Minimal 6/20 - pureed diet  . Fibromyalgia  04/20/2008   Qualifier: Diagnosis of  By: Nelson-Smith CMA (AAMA), Dottie    . GERD (gastroesophageal reflux disease)   . Glaucoma   . HTN (hypertension)   . Hyperlipidemia   . Ischemic cardiomyopathy    EF 45%cath 2012 EF55% 5/12; 35% 11/14  . Memory deficit   . Nephrolithiasis   . Obesity (BMI 30-39.9) 10/06/2015  . Osteoarthritis   . Overweight(278.02)   . Respiratory arrest//ACE Inhibitor presumed cause   . 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 Sleep apnea   . Small bowel obstruction (HCC)    from a sigmoid stricture  . Ventricular tachycardia ([place]) 03/17/2015  . Vitamin D deficiency 01/28/2017    Past Surgical History:  Procedure Laterality Date  . bilateral tubal ligation    . CARDIAC CATHETERIZATION N/A 03/18/2015   Procedure: LEFT HEART CATH AND CORONARY ANGIOGRAPHY;  Surgeon: Lorretta Harp, MD;  Location[facility] CATH LAB;  Service: Cardiovascular;  Laterality: N/A;  . CARDIAC SURGERY    . HIP ARTHROPLASTY Right 11/10/2016   Procedure: ARTHROPLASTY BIPOLAR HIP (HEMIARTHROPLASTY);  Surgeon: Marchia Bond, MD;  Location: [facility];  Service: Orthopedics;  Laterality: Right;  . 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 LITHOTRIPSY      Allergies as of 04/16/2018      Reactions   Lisinopril Other (See Comments)   Reaction:  Respiratory arrest    Spironolactone Anaphylaxis   Vancomycin Anaphylaxis, Rash   Donepezil Diarrhea   Eggs Or Egg-derived Products Diarrhea   Erythromycin Other (See Comments)   Reaction:  Makes pt feel weird    Macrolides And Ketolides Other (See Comments)  Reaction:  Unknown    Penicillins Itching, Other (See Comments)   Has patient had a PCN reaction causing immediate rash, facial/tongue/throat swelling, SOB or lightheadedness with hypotension: No Has patient had a PCN reaction causing severe rash involving mucus membranes or skin necrosis: No Has patient had a PCN reaction that required  hospitalization No Has patient had a PCN reaction occurring within the last 10 years: No If all of the above answers are "NO", then may proceed with Cephalosporin use.   Quinapril Hcl Itching   Angiotensin Receptor Blockers Other (See Comments)   Reaction:  Unknown    Lipitor [atorvastatin] Palpitations   Pentazocine Lactate Other (See Comments)   Reaction:  Unknown    Propoxyphene Hcl Other (See Comments)   Reaction unknown   Sulfonamide Derivatives Other (See Comments)   Reaction:  Unknown       Medication List        Accurate as of 04/16/18 11:59 PM. Always use your most recent med list.          albuterol 108 (90 Base) MCG/ACT inhaler Commonly known as:  PROVENTIL HFA;VENTOLIN HFA Inhale 2 puffs into the lungs every 6 (six) hours as needed for wheezing or shortness of breath.   ALPRAZolam 0.25 MG tablet Commonly known as:  XANAX Take 0.25 mg by mouth 2 (two) times daily.   aspirin EC 81 MG tablet Take 81 mg by mouth daily.   atorvastatin 40 MG tablet Commonly known as:  LIPITOR Take 40 mg by mouth daily.   clopidogrel 75 MG tablet Commonly known as:  PLAVIX Take 75 mg by mouth daily.   furosemide 20 MG tablet Commonly known as:  LASIX Take 20 mg by mouth daily.   HUMALOG KWIKPEN 100 UNIT/ML KiwkPen Generic drug:  insulin lispro Inject 4 Units into the skin 3 (three) times daily with meals.   insulin glargine 100 UNIT/ML injection Commonly known as:  LANTUS Inject 15 Units into the skin at bedtime. If blood sugar is > 150   isosorbide mononitrate 30 MG 24 hr tablet Commonly known as:  IMDUR Take 0.5 tablets (15 mg total) by mouth daily.   memantine 10 MG tablet Commonly known as:  NAMENDA Take 10 mg by mouth 2 (two) times daily.   metFORMIN 1000 MG tablet Commonly known as:  GLUCOPHAGE Take 1,000 mg by mouth 2 (two) times daily with a meal.   metoprolol succinate 25 MG 24 hr tablet Commonly known as:  TOPROL XL Take 0.5 tablets (12.5 mg total) by  mouth daily.   mirtazapine 15 MG disintegrating tablet Commonly known as:  REMERON SOL-TAB Take 1 tablet (15 mg total) by mouth at bedtime.   mometasone-formoterol 100-5 MCG/ACT Aero Commonly known as:  DULERA Inhale 2 puffs into the lungs 2 (two) times daily.   nitroGLYCERIN 0.4 MG SL tablet Commonly known as:  NITROSTAT Place 0.4 mg under the tongue every 5 (five) minutes as needed for chest pain.   pantoprazole 40 MG tablet Commonly known as:  PROTONIX Take 40 mg by mouth daily.   polyethylene glycol packet Commonly known as:  MIRALAX / GLYCOLAX Take 17 g by mouth daily as needed for mild constipation.   sennosides-docusate sodium 8.6-50 MG tablet Commonly known as:  SENOKOT-S Take 2 tablets by mouth daily.   traMADol 50 MG tablet Commonly known as:  ULTRAM Take 50 mg by mouth 2 (two) times daily. Back pain   traZODone 50 MG tablet Commonly known as:  DESYREL Take  25 mg by mouth at bedtime.       No orders of the defined types were placed in this encounter.   Immunization History  Administered Date(s) Administered  . Influenza Split 08/20/2014  . Influenza, High Dose Seasonal PF 10/06/2013  . Influenza-Unspecified 10/17/2017  . PPD Test 11/16/2016, 12/05/2016  . Pneumococcal Conjugate-13 10/17/2017  . Pneumococcal Polysaccharide-23 09/24/2009  . Tdap 10/18/2017    Social History   Tobacco Use  . Smoking status: Former Smoker    Packs/day: 1.00    Years: 25.00    Pack years: 25.00    Types: Cigarettes  . Smokeless tobacco: Never Used  . Tobacco comment: over a ppd for 40+ years; quit 10/2004.  unsuccessfully tried eCigs.  Substance Use Topics  . Alcohol use: No    Alcohol/week: 0.6 oz    Types: 1 Standard drinks or equivalent per week    Review of Systems  DATA OBTAINED: from patient, nurse GENERAL:  no fevers, fatigue, appetite changes SKIN: No itching, rash HEENT: No complaint RESPIRATORY: No cough, wheezing, SOB CARDIAC: No chest pain,  palpitations, lower extremity edema  GI: No abdominal pain, No N/V/D or constipation, No heartburn or reflux  GU: No dysuria, frequency or urgency, or incontinence  MUSCULOSKELETAL: No unrelieved bone/joint pain NEUROLOGIC: No headache, dizziness  PSYCHIATRIC: No overt anxiety or sadness  Vitals:   04/16/18 1216  BP: (!) 128/56  Pulse: 74  Resp: 16  Temp: 98 F (36.7 C)  SpO2: 96%   Body mass index is 21.83 kg/m. Physical Exam  GENERAL APPEARANCE: Alert, minimally conversant, No acute distress  SKIN: No diaphoresis rash HEENT: Unremarkable RESPIRATORY: Breathing is even, unlabored. Lung sounds are clear   CARDIOVASCULAR: Heart RRR no murmurs, rubs or gallops. No peripheral edema  GASTROINTESTINAL: Abdomen is soft, non-tender, not distended w/ normal bowel sounds.  GENITOURINARY: Bladder non tender, not distended  MUSCULOSKELETAL: No abnormal joints or musculature NEUROLOGIC: Cranial nerves 2-12 grossly intact. Moves all extremities PSYCHIATRIC: Mood and affect dementia, no behavioral issues  Patient Active Problem List   Diagnosis Date Noted  . GERD (gastroesophageal reflux disease) 08/04/2017  . Dysphagia 05/10/2017  . Depression 03/14/2017  . Vitamin D deficiency 01/28/2017  . UTI due to extended-spectrum beta lactamase (ESBL) producing Escherichia coli 11/18/2016  . Urinary tract infection due to Proteus 11/18/2016  . Postoperative anemia due to acute blood loss 11/18/2016  . HTN (hypertension) 11/18/2016  . Chronic diastolic CHF (congestive heart failure) ([place]) 11/12/2016  . Closed right hip fracture ([place]) 11/10/2016  . 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 Obesity (BMI 30-39.9) 10/06/2015  . Tobacco use disorder 03/17/2015  . Cardiomyopathy, ischemic 03/17/2015  . Allergy to ACE inhibitors 03/17/2015  . Ventricular tachycardia ([place]) 03/17/2015  . HLD (hyperlipidemia) 12/31/2014  . COPD (chronic obstructive pulmonary disease) ([place])  12/31/2014  . Chronic cough 09/12/2013  . Memory deficit 04/23/2013  . Essential hypertension, benign 04/03/2013  . Dyslipidemia 04/03/2013  . Coronary atherosclerosis of native coronary artery 04/03/2013  . DM2 (diabetes mellitus, type 2) ([place]) 04/02/2013  . Fibromyalgia 04/20/2008    CMP     Component Value Date/Time   NA 148 (A) 04/18/2018   K 3.2 (A) 04/18/2018   CL 103 11/16/2016 0522   CO2 31 11/16/2016 0522   GLUCOSE 220 (H) 11/16/2016 0522   BUN 14 04/18/2018   CREATININE 0.7 04/18/2018   CREATININE 0.77 11/16/2016 0522   CALCIUM 8.2 (L) 11/16/2016 0522   PROT 7.7 04/27/2016 1724  ALBUMIN 4.4 04/27/2016 1724   AST 14 04/18/2018   ALT 7 04/18/2018   ALKPHOS 87 04/18/2018   BILITOT 1.2 04/27/2016 1724   GFRNONAA >60 11/16/2016 0522   GFRAA >60 11/16/2016 0522   Recent Labs    10/23/17 04/18/18  NA  --  148*  K  --  3.2*  BUN 19 14  CREATININE 0.6 0.7   Recent Labs    10/23/17 04/18/18  AST 15 14  ALT 5* 7  ALKPHOS 107 87   Recent Labs    10/23/17 04/18/18  WBC 8.0 12.9  HGB 13.4 14.8  HCT 40 44  PLT 198 175   Recent Labs    10/23/17  CHOL 137  LDLCALC 78  TRIG 132   Lab Results  Component Value Date   MICROALBUR 1.9 06/22/2017   Lab Results  Component Value Date   TSH 3.25 04/18/2018   Lab Results  Component Value Date   HGBA1C 6.1 04/18/2018   Lab Results  Component Value Date   CHOL 137 10/23/2017   HDL 33 (A) 10/23/2017   LDLCALC 78 10/23/2017   TRIG 132 10/23/2017   CHOLHDL 2 12/19/2012    Significant Diagnostic Results in last 30 days:  No results found.  Assessment and Plan  Essential hypertension, benign Controlled; continue Imdur 15 mg daily, Toprol-XL 12.5 mg daily and Lasix 40 mg daily  Coronary atherosclerosis of native coronary artery Stable; continue Imdur 50 mg daily, Toprol-XL 12.5 mg daily, ASA 81 mg daily, Plavix 75 mg daily, and nitroglycerin sublingual as needed; patient is on statin  Chronic diastolic  CHF (congestive heart failure) (HCC) Able; continue Lasix 40 mg daily and Toprol-XL 12.5 mg daily     Holten Spano D. [name], MD

## 2018-04-17 DIAGNOSIS — Z9181 History of falling: Secondary | ICD-10-CM | POA: Diagnosis not present

## 2018-04-17 DIAGNOSIS — R278 Other lack of coordination: Secondary | ICD-10-CM | POA: Diagnosis not present

## 2018-04-17 DIAGNOSIS — M6281 Muscle weakness (generalized): Secondary | ICD-10-CM | POA: Diagnosis not present

## 2018-04-18 DIAGNOSIS — I10 Essential (primary) hypertension: Secondary | ICD-10-CM | POA: Diagnosis not present

## 2018-04-18 DIAGNOSIS — E1122 Type 2 diabetes mellitus with diabetic chronic kidney disease: Secondary | ICD-10-CM | POA: Diagnosis not present

## 2018-04-18 DIAGNOSIS — E119 Type 2 diabetes mellitus without complications: Secondary | ICD-10-CM | POA: Diagnosis not present

## 2018-04-18 DIAGNOSIS — E559 Vitamin D deficiency, unspecified: Secondary | ICD-10-CM | POA: Diagnosis not present

## 2018-04-18 DIAGNOSIS — D649 Anemia, unspecified: Secondary | ICD-10-CM | POA: Diagnosis not present

## 2018-04-18 DIAGNOSIS — E039 Hypothyroidism, unspecified: Secondary | ICD-10-CM | POA: Diagnosis not present

## 2018-04-18 DIAGNOSIS — E785 Hyperlipidemia, unspecified: Secondary | ICD-10-CM | POA: Diagnosis not present

## 2018-04-18 LAB — BASIC METABOLIC PANEL
BUN: 14 (ref 4–21)
CREATININE: 0.7 (ref 0.5–1.1)
Glucose: 171
Potassium: 3.2 — AB (ref 3.4–5.3)
Sodium: 148 — AB (ref 137–147)

## 2018-04-18 LAB — HEPATIC FUNCTION PANEL
ALT: 7 (ref 7–35)
AST: 14 (ref 13–35)
Alkaline Phosphatase: 87 (ref 25–125)
Bilirubin, Total: 0.5

## 2018-04-18 LAB — VITAMIN D 25 HYDROXY (VIT D DEFICIENCY, FRACTURES): Vit D, 25-Hydroxy: 24.93

## 2018-04-18 LAB — HEMOGLOBIN A1C: Hemoglobin A1C: 6.1

## 2018-04-18 LAB — CBC AND DIFFERENTIAL
HEMATOCRIT: 44 (ref 36–46)
Hemoglobin: 14.8 (ref 12.0–16.0)
Platelets: 175 (ref 150–399)
WBC: 12.9

## 2018-04-18 LAB — TSH: TSH: 3.25 (ref 0.41–5.90)

## 2018-04-19 ENCOUNTER — Non-Acute Institutional Stay (SKILLED_NURSING_FACILITY): Payer: Medicare Other

## 2018-04-19 DIAGNOSIS — Z0000 Encounter for general adult medical examination without abnormal findings: Secondary | ICD-10-CM

## 2018-04-19 NOTE — Patient Instructions (Addendum)
[name] , Thank you for taking time to come for your Medicare Wellness Visit. I appreciate your ongoing commitment to your health goals. Please review the following plan we discussed and let me know if I can assist you in the future.   Screening recommendations/referrals: Colonoscopy excluded, over age 77 Mammogram excluded, over age 86 Bone Density up to date Recommended yearly ophthalmology/optometry visit for glaucoma screening and checkup Recommended yearly dental visit for hygiene and checkup  Vaccinations: Influenza vaccine up to date, due 2019 fall season Pneumococcal vaccine up to date, completed Tdap vaccine up to date, due 10/19/2027 Shingles vaccine not in past records    Advanced directives: in chart  Conditions/risks identified: none  Next appointment: Dr. [name] makes rounds   Preventive Care 65 Years and Older, Female Preventive care refers to lifestyle choices and visits with your health care provider that can promote health and wellness. What does preventive care include?  A yearly physical exam. This is also called an annual well check.  Dental exams once or twice a year.  Routine eye exams. Ask your health care provider how often you should have your eyes checked.  Personal lifestyle choices, including:  Daily care of your teeth and gums.  Regular physical activity.  Eating a healthy diet.  Avoiding tobacco and drug use.  Limiting alcohol use.  Practicing safe sex.  Taking low-dose aspirin every day.  Taking vitamin and mineral supplements as recommended by your health care provider. What happens during an annual well check? The services and screenings done by your health care provider during your annual well check will depend on your age, overall health, lifestyle risk factors, and family history of disease. Counseling  Your health care provider may ask you questions about your:  Alcohol use.  Tobacco use.  Drug use.  Emotional  well-being.  Home and relationship well-being.  Sexual activity.  Eating habits.  History of falls.  Memory and ability to understand (cognition).  Work and work Statistician.  Reproductive health. Screening  You may have the following tests or measurements:  Height, weight, and BMI.  Blood pressure.  Lipid and cholesterol levels. These may be checked every 5 years, or more frequently if you are over 13 years old.  Skin check.  Lung cancer screening. You may have this screening every year starting at age 31 if you have a 30-pack-year history of smoking and currently smoke or have quit within the past 15 years.  Fecal occult blood test (FOBT) of the stool. You may have this test every year starting at age 54.  Flexible sigmoidoscopy or colonoscopy. You may have a sigmoidoscopy every 5 years or a colonoscopy every 10 years starting at age 64.  Hepatitis C blood test.  Hepatitis B blood test.  Sexually transmitted disease (STD) testing.  Diabetes screening. This is done by checking your blood sugar (glucose) after you have not eaten for a while (fasting). You may have this done every 1-3 years.  Bone density scan. This is done to screen for osteoporosis. You may have this done starting at age 77.  Mammogram. This may be done every 1-2 years. Talk to your health care provider about how often you should have regular mammograms. Talk with your health care provider about your test results, treatment options, and if necessary, the need for more tests. Vaccines  Your health care provider may recommend certain vaccines, such as:  Influenza vaccine. This is recommended every year.  Tetanus, diphtheria, and acellular pertussis (Tdap,  Td) vaccine. You may need a Td booster every 10 years.  Zoster vaccine. You may need this after age 20.  Pneumococcal 13-valent conjugate (PCV13) vaccine. One dose is recommended after age 70.  Pneumococcal polysaccharide (PPSV23) vaccine. One  dose is recommended after age 52. Talk to your health care provider about which screenings and vaccines you need and how often you need them. This information is not intended to replace advice given to you by your health care provider. Make sure you discuss any questions you have with your health care provider. Document Released: 12/03/2015 Document Revised: 07/26/2016 Document Reviewed: 09/07/2015 Elsevier Interactive Patient Education  2017 [employer] Prevention in the Home Falls can cause injuries. They can happen to people of all ages. There are many things you can do to make your home safe and to help prevent falls. What can I do on the outside of my home?  Regularly fix the edges of walkways and driveways and fix any cracks.  Remove anything that might make you trip as you walk through a door, such as a raised step or threshold.  Trim any bushes or trees on the path to your home.  Use bright outdoor lighting.  Clear any walking paths of anything that might make someone trip, such as rocks or tools.  Regularly check to see if handrails are loose or broken. Make sure that both sides of any steps have handrails.  Any raised decks and porches should have guardrails on the edges.  Have any leaves, snow, or ice cleared regularly.  Use sand or salt on walking paths during winter.  Clean up any spills in your garage right away. This includes oil or grease spills. What can I do in the bathroom?  Use night lights.  Install grab bars by the toilet and in the tub and shower. Do not use towel bars as grab bars.  Use non-skid mats or decals in the tub or shower.  If you need to sit down in the shower, use a plastic, non-slip stool.  Keep the floor dry. Clean up any water that spills on the floor as soon as it happens.  Remove soap buildup in the tub or shower regularly.  Attach bath mats securely with double-sided non-slip rug tape.  Do not have throw rugs and other  things on the floor that can make you trip. What can I do in the bedroom?  Use night lights.  Make sure that you have a light by your bed that is easy to reach.  Do not use any sheets or blankets that are too big for your bed. They should not hang down onto the floor.  Have a firm chair that has side arms. You can use this for support while you get dressed.  Do not have throw rugs and other things on the floor that can make you trip. What can I do in the kitchen?  Clean up any spills right away.  Avoid walking on wet floors.  Keep items that you use a lot in easy-to-reach places.  If you need to reach something above you, use a strong step stool that has a grab bar.  Keep electrical cords out of the way.  Do not use floor polish or wax that makes floors slippery. If you must use wax, use non-skid floor wax.  Do not have throw rugs and other things on the floor that can make you trip. What can I do with my stairs?  Do not  leave any items on the stairs.  Make sure that there are handrails on both sides of the stairs and use them. Fix handrails that are broken or loose. Make sure that handrails are as long as the stairways.  Check any carpeting to make sure that it is firmly attached to the stairs. Fix any carpet that is loose or worn.  Avoid having throw rugs at the top or bottom of the stairs. If you do have throw rugs, attach them to the floor with carpet tape.  Make sure that you have a light switch at the top of the stairs and the bottom of the stairs. If you do not have them, ask someone to add them for you. What else can I do to help prevent falls?  Wear shoes that:  Do not have high heels.  Have rubber bottoms.  Are comfortable and fit you well.  Are closed at the toe. Do not wear sandals.  If you use a stepladder:  Make sure that it is fully opened. Do not climb a closed stepladder.  Make sure that both sides of the stepladder are locked into place.  Ask  someone to hold it for you, if possible.  Clearly mark and make sure that you can see:  Any grab bars or handrails.  First and last steps.  Where the edge of each step is.  Use tools that help you move around (mobility aids) if they are needed. These include:  Canes.  Walkers.  Scooters.  Crutches.  Turn on the lights when you go into a dark area. Replace any light bulbs as soon as they burn out.  Set up your furniture so you have a clear path. Avoid moving your furniture around.  If any of your floors are uneven, fix them.  If there are any pets around you, be aware of where they are.  Review your medicines with your doctor. Some medicines can make you feel dizzy. This can increase your chance of falling. Ask your doctor what other things that you can do to help prevent falls. This information is not intended to replace advice given to you by your health care provider. Make sure you discuss any questions you have with your health care provider. Document Released: 09/02/2009 Document Revised: 04/13/2016 Document Reviewed: 12/11/2014 Elsevier Interactive Patient Education  2017 [employer].

## 2018-04-19 NOTE — Progress Notes (Signed)
Subjective:   [name] is a 77 y.o. female who presents for an Initial Medicare Annual Wellness Visit at [facility]       Objective:    Today's Vitals   04/19/18 1355  BP: (!) 142/68  Pulse: 84  Temp: 98 F (36.7 C)  TempSrc: Oral  SpO2: 94%  Weight: 127 lb (57.6 kg)  Height: 5\' 4"  (1.626 m)   Body mass index is 21.8 kg/m.  Advanced Directives 04/19/2018 04/16/2018 03/18/2018 12/04/2017 11/07/2017 11/07/2017 10/16/2017  Does Patient Have a Medical Advance Directive? Yes Yes Yes Yes Yes Yes Yes  Type of Advance Directive Out of facility DNR (pink MOST or yellow form) Out of facility DNR (pink MOST or yellow form) Out of facility DNR (pink MOST or yellow form) Out of facility DNR (pink MOST or yellow form) Out of facility DNR (pink MOST or yellow form) Out of facility DNR (pink MOST or yellow form) Out of facility DNR (pink MOST or yellow form)  Does patient want to make changes to medical advance directive? No - Patient declined No - Patient declined No - Patient declined No - Patient declined - - -  Copy of [place] in [place]  Would patient like information on creating a medical advance directive? - - - - - - -  Pre-existing out of facility DNR order (yellow form or pink MOST form) Yellow form placed in chart (order not valid for inpatient use) Yellow form placed in chart (order not valid for inpatient use) Yellow form placed in chart (order not valid for inpatient use) Yellow form placed in chart (order not valid for inpatient use) Yellow form placed in chart (order not valid for inpatient use) Yellow form placed in chart (order not valid for inpatient use) Yellow form placed in chart (order not valid for inpatient use)    Current Medications (verified) Outpatient Encounter Medications as of 04/19/2018  Medication Sig  . albuterol (PROVENTIL HFA;VENTOLIN HFA) 108 (90 Base) MCG/ACT inhaler Inhale 2 puffs into the lungs every 6  (six) hours as needed for wheezing or shortness of breath.  . ALPRAZolam (XANAX) 0.25 MG tablet Take 0.25 mg by mouth 2 (two) times daily.   Marland Kitchen aspirin EC 81 MG tablet Take 81 mg by mouth daily.  Marland Kitchen atorvastatin (LIPITOR) 40 MG tablet Take 40 mg by mouth daily.   . clopidogrel (PLAVIX) 75 MG tablet Take 75 mg by mouth daily.   . furosemide (LASIX) 20 MG tablet Take 20 mg by mouth daily.   . insulin glargine (LANTUS) 100 UNIT/ML injection Inject 15 Units into the skin at bedtime. If blood sugar is > 150  . insulin lispro (HUMALOG KWIKPEN) 100 UNIT/ML KiwkPen Inject 4 Units into the skin 3 (three) times daily with meals.   . isosorbide mononitrate (IMDUR) 30 MG 24 hr tablet Take 0.5 tablets (15 mg total) by mouth daily.  . memantine (NAMENDA) 10 MG tablet Take 10 mg by mouth 2 (two) times daily.   . metFORMIN (GLUCOPHAGE) 1000 MG tablet Take 1,000 mg by mouth 2 (two) times daily with a meal.   . metoprolol succinate (TOPROL XL) 25 MG 24 hr tablet Take 0.5 tablets (12.5 mg total) by mouth daily.  . mirtazapine (REMERON SOL-TAB) 15 MG disintegrating tablet Take 1 tablet (15 mg total) by mouth at bedtime.  . mometasone-formoterol (DULERA) 100-5 MCG/ACT AERO Inhale 2 puffs into the lungs 2 (two)  times daily.   . nitroGLYCERIN (NITROSTAT) 0.4 MG SL tablet Place 0.4 mg under the tongue every 5 (five) minutes as needed for chest pain.  . pantoprazole (PROTONIX) 40 MG tablet Take 40 mg by mouth daily.  . polyethylene glycol (MIRALAX / GLYCOLAX) packet Take 17 g by mouth daily as needed for mild constipation.  . sennosides-docusate sodium (SENOKOT-S) 8.6-50 MG tablet Take 2 tablets by mouth daily.  . traMADol (ULTRAM) 50 MG tablet Take 50 mg by mouth 2 (two) times daily. Back pain  . traZODone (DESYREL) 50 MG tablet Take 25 mg by mouth at bedtime.    No facility-administered encounter medications on file as of 04/19/2018.     Allergies (verified) Lisinopril; Spironolactone; Vancomycin; Donepezil; Eggs or  egg-derived products; Erythromycin; Macrolides and ketolides; Penicillins; Quinapril hcl; Angiotensin receptor blockers; Lipitor [atorvastatin]; Pentazocine lactate; Propoxyphene hcl; and Sulfonamide derivatives   History: Past Medical History:  Diagnosis Date  . Allergy to ACE inhibitors 03/17/2015  . Anxiety   . CAD (coronary artery disease)    a. s/p inferior STEMI 12/29/10 with rotablator atherectomy RCA 12/31/10 and DES x 2 RCA;  b. Lexiscan Myoview (02/2015): mild reversible apical anterior perfusion defect. C. cath 02/2015 50% LAD lesion with patent stent, Tx Rx    . Cardiomyopathy, ischemic 03/17/2015  . Chronic diastolic CHF (congestive heart failure) ([place]) 11/12/2016  . COPD (chronic obstructive pulmonary disease) ([place])   . Coronary atherosclerosis of native coronary artery 04/03/2013  . Dementia   . Depression 03/14/2017  . Diabetes mellitus    type 2  . Diverticulosis   . DM2 (diabetes mellitus, type 2) ([place]) 04/02/2013  . Dysphagia 05/10/2017   Minimal 6/20 - pureed diet  . Fibromyalgia 04/20/2008   Qualifier: Diagnosis of  By: Nelson-Smith CMA (AAMA), Dottie    . GERD (gastroesophageal reflux disease)   . Glaucoma   . HTN (hypertension)   . Hyperlipidemia   . Ischemic cardiomyopathy    EF 45%cath 2012 EF55% 5/12; 35% 11/14  . Memory deficit   . Nephrolithiasis   . Obesity (BMI 30-39.9) 10/06/2015  . Osteoarthritis   . Overweight(278.02)   . Respiratory arrest//ACE Inhibitor presumed cause   . 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 Sleep apnea   . Small bowel obstruction (HCC)    from a sigmoid stricture  . Ventricular tachycardia ([place]) 03/17/2015  . Vitamin D deficiency 01/28/2017   Past Surgical History:  Procedure Laterality Date  . bilateral tubal ligation    . CARDIAC CATHETERIZATION N/A 03/18/2015   Procedure: LEFT HEART CATH AND CORONARY ANGIOGRAPHY;  Surgeon: Lorretta Harp, MD;  Location: [facility] CATH LAB;  Service: Cardiovascular;  Laterality:  N/A;  . CARDIAC SURGERY    . HIP ARTHROPLASTY Right 11/10/2016   Procedure: ARTHROPLASTY BIPOLAR HIP (HEMIARTHROPLASTY);  Surgeon: Marchia Bond, MD;  Location: [facility];  Service: Orthopedics;  Laterality: Right;  . 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 LITHOTRIPSY     Family History  Problem Relation Age of Onset  . Diabetes Father   . Heart disease Father   . Heart attack Father   . Stroke Mother   . Breast cancer Maternal Grandmother   . Diabetes Sister   . Hypertension Sister   . Colon cancer Neg Hx    Social History   Socioeconomic History  . Marital status: Widowed    Spouse name: Not on file  . Number of children: 2  . Years of education:  11  . Highest education level: Not on file  Occupational History  . Occupation: Retired    Comment: Haematologist   Social Needs  . Financial resource strain: Not hard at all  . Food insecurity:    Worry: Never true    Inability: Never true  . Transportation needs:    Medical: No    Non-medical: No  Tobacco Use  . Smoking status: Former Smoker    Packs/day: 1.00    Years: 25.00    Pack years: 25.00    Types: Cigarettes  . Smokeless tobacco: Never Used  . Tobacco comment: over a ppd for 40+ years; quit 10/2004.  unsuccessfully tried eCigs.  Substance and Sexual Activity  . Alcohol use: No    Alcohol/week: 0.5 oz    Types: 1 Standard drinks or equivalent per week  . Drug use: No  . Sexual activity: Never  Lifestyle  . Physical activity:    Days per week: 0 days    Minutes per session: 0 min  . Stress: Only a little  Relationships  . Social connections:    Talks on phone: Twice a week    Gets together: Twice a week    Attends religious service: Never    Active member of club or organization: No    Attends meetings of clubs or organizations: Never    Relationship status: Widowed  Other Topics Concern  . Not on file  Social History Narrative   Admitted to [facility] 11/16/16   Patient is widowed with 2 children.   Patient is right handed.   Patient has 11 th grade education.   Former smoker-can't smoke since admitted to [facility]   Alcohol none   DNR    Tobacco Counseling Counseling given: Not Answered Comment: over a ppd for 40+ years; quit 10/2004.  unsuccessfully tried eCigs.   Clinical Intake:  Pre-visit preparation completed: No  Pain : No/denies pain     Nutritional Risks: None Diabetes: Yes CBG done?: No Did pt. bring in CBG monitor from home?: No  How often do you need to have someone help you when you read instructions, pamphlets, or other written materials from your doctor or pharmacy?: 3 - Sometimes  Interpreter Needed?: No  Information entered by :: Tyson Dense, RN   Activities of Daily Living In your present state of health, do you have any difficulty performing the following activities: 04/19/2018  Hearing? N  Vision? N  Difficulty concentrating or making decisions? Y  Walking or climbing stairs? Y  Dressing or bathing? Y  Doing errands, shopping? Y  Preparing Food and eating ? Y  Using the Toilet? Y  In the past six months, have you accidently leaked urine? Y  Do you have problems with loss of bowel control? Y  Managing your Medications? Y  Managing your Finances? Y  Housekeeping or managing your Housekeeping? Y  Some recent data might be hidden     Immunizations and Health Maintenance Immunization History  Administered Date(s) Administered  . Influenza Split 08/20/2014  . Influenza, High Dose Seasonal PF 10/06/2013  . Influenza-Unspecified 10/17/2017  . PPD Test 11/16/2016, 12/05/2016  . Pneumococcal Conjugate-13 10/17/2017  . Pneumococcal Polysaccharide-23 09/24/2009  . Tdap 10/18/2017   Health Maintenance Due  Topic Date Due  . FOOT EXAM  11/03/1951  . OPHTHALMOLOGY EXAM  11/03/1951    Patient Care Team: Hennie Duos, MD as PCP - General (Internal Medicine)  Indicate  any recent Medical Services you may have received from other than [facility] providers in the past year (date may be approximate).     Assessment:   This is a routine wellness examination for Simonne.  Hearing/Vision screen No exam data present  Dietary issues and exercise activities discussed: Current Exercise Habits: The patient does not participate in regular exercise at present, Exercise limited by: neurologic condition(s)  Goals    None     Depression Screen PHQ 2/9 Scores 04/19/2018  PHQ - 2 Score 0    Fall Risk Fall Risk  04/19/2018  Falls in the past year? No    Is the patient's home free of loose throw rugs in walkways, pet beds, electrical cords, etc?   yes      Grab bars in the bathroom? yes      Handrails on the stairs?   yes      Adequate lighting?   yes   Cognitive Function: MMSE - Mini Mental State Exam 07/06/2015 07/02/2014  Orientation to time 1 2  Orientation to Place 4 3  Registration 3 3  Attention/ Calculation 5 4  Recall 3 0  Language- name 2 objects 1 2  Language- repeat 1 1  Language- follow 3 step command 3 3  Language- read & follow direction 1 1  Write a sentence 1 1  Copy design 0 1  Total score 23 21     6CIT Screen 04/19/2018  What Year? 4 points  What month? 3 points  What time? 3 points  Count back from 20 0 points  Months in reverse 4 points  Repeat phrase 8 points  Total Score 22    Screening Tests Health Maintenance  Topic Date Due  . FOOT EXAM  11/03/1951  . OPHTHALMOLOGY EXAM  11/03/1951  . INFLUENZA VACCINE  06/20/2018  . URINE MICROALBUMIN  06/22/2018  . HEMOGLOBIN A1C  10/19/2018  . TETANUS/TDAP  10/19/2027  . DEXA SCAN  Completed  . PNA vac Low Risk Adult  Completed    Qualifies for Shingles Vaccine? Not in past records  Cancer Screenings: Lung: Low Dose CT Chest recommended if Age 103-80 years, 30 pack-year currently smoking OR have quit w/in 15years. Patient does not qualify. Breast: Up to date on Mammogram? Yes     Up to date of Bone Density/Dexa? Yes Colorectal: up to date  Additional Screenings:  Hepatitis C Screening: declined Diabetic eye exam due-ordered     Plan:    I have personally reviewed and addressed the Medicare Annual Wellness questionnaire and have noted the following in the patient's chart:  A. Medical and social history B. Use of alcohol, tobacco or illicit drugs  C. Current medications and supplements D. Functional ability and status E.  Nutritional status F.  Physical activity G. Advance directives H. List of other physicians I.  Hospitalizations, surgeries, and ER visits in previous 12 months J.  [place] to include hearing, vision, cognitive, depression L. Referrals and appointments - none  In addition, I have reviewed and discussed with patient certain preventive protocols, quality metrics, and best practice recommendations. A written personalized care plan for preventive services as well as general preventive health recommendations were provided to patient.  See attached scanned questionnaire for additional information.   Signed,   Tyson Dense, RN Nurse Health Advisor  Patient Concerns: None

## 2018-05-02 DIAGNOSIS — F331 Major depressive disorder, recurrent, moderate: Secondary | ICD-10-CM | POA: Diagnosis not present

## 2018-05-02 DIAGNOSIS — F0390 Unspecified dementia without behavioral disturbance: Secondary | ICD-10-CM | POA: Diagnosis not present

## 2018-05-02 DIAGNOSIS — F419 Anxiety disorder, unspecified: Secondary | ICD-10-CM | POA: Diagnosis not present

## 2018-05-10 ENCOUNTER — Non-Acute Institutional Stay (SKILLED_NURSING_FACILITY): Payer: Medicare Other | Admitting: Internal Medicine

## 2018-05-10 ENCOUNTER — Encounter: Payer: Self-pay | Admitting: Internal Medicine

## 2018-05-10 DIAGNOSIS — J449 Chronic obstructive pulmonary disease, unspecified: Secondary | ICD-10-CM | POA: Diagnosis not present

## 2018-05-10 DIAGNOSIS — K219 Gastro-esophageal reflux disease without esophagitis: Secondary | ICD-10-CM

## 2018-05-10 DIAGNOSIS — R1312 Dysphagia, oropharyngeal phase: Secondary | ICD-10-CM

## 2018-05-10 NOTE — Progress Notes (Signed)
Location:  [facility] Room Number: [place]:  SNF ([phone])  Kathleen Duos, MD  Patient Care Team: Kathleen Duos, MD as PCP - General (Internal Medicine)  Extended Emergency Contact Information Primary Emergency Contact: [name],Kathleen Address: [address]          [place], [place] [address] [name] of [place] Phone: [phone] Work Phone: [phone] Mobile Phone: [phone] Relation: Daughter Secondary Emergency Contact: [name],Kathleen  [place] Phone: [phone] Relation: Niece    Allergies: Lisinopril; Spironolactone; Vancomycin; Donepezil; Eggs or egg-derived products; Erythromycin; Macrolides and ketolides; Penicillins; Quinapril hcl; Angiotensin receptor blockers; Lipitor [atorvastatin]; Pentazocine lactate; Propoxyphene hcl; and Sulfonamide derivatives  Chief Complaint  Patient presents with  . Medical Management of Chronic Issues    HPI: Patient is 77 y.o. female who is being seen for routine issues of GERD, dysphasia, and COPD.  Past Medical History:  Diagnosis Date  . Allergy to ACE inhibitors 03/17/2015  . Anxiety   . CAD (coronary artery disease)    a. s/p inferior STEMI 12/29/10 with rotablator atherectomy RCA 12/31/10 and DES x 2 RCA;  b. Lexiscan Myoview (02/2015): mild reversible apical anterior perfusion defect. C. cath 02/2015 50% LAD lesion with patent stent, Tx Rx    . Cardiomyopathy, ischemic 03/17/2015  . Chronic diastolic CHF (congestive heart failure) ([place]) 11/12/2016  . COPD (chronic obstructive pulmonary disease) ([place])   . Coronary atherosclerosis of native coronary artery 04/03/2013  . Dementia   . Depression 03/14/2017  . Diabetes mellitus    type 2  . Diverticulosis   . DM2 (diabetes mellitus, type 2) ([place]) 04/02/2013  . Dysphagia 05/10/2017   Minimal 6/20 - pureed diet  . Fibromyalgia 04/20/2008   Qualifier: Diagnosis of  By: Nelson-Smith CMA (AAMA), Dottie    .  GERD (gastroesophageal reflux disease)   . Glaucoma   . HTN (hypertension)   . Hyperlipidemia   . Ischemic cardiomyopathy    EF 45%cath 2012 EF55% 5/12; 35% 11/14  . Memory deficit   . Nephrolithiasis   . Obesity (BMI 30-39.9) 10/06/2015  . Osteoarthritis   . Overweight(278.02)   . Respiratory arrest//ACE Inhibitor presumed cause   . 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 Sleep apnea   . Small bowel obstruction (HCC)    from a sigmoid stricture  . Ventricular tachycardia ([place]) 03/17/2015  . Vitamin D deficiency 01/28/2017    Past Surgical History:  Procedure Laterality Date  . bilateral tubal ligation    . CARDIAC CATHETERIZATION N/A 03/18/2015   Procedure: LEFT HEART CATH AND CORONARY ANGIOGRAPHY;  Surgeon: Lorretta Harp, MD;  Location: [facility] CATH LAB;  Service: Cardiovascular;  Laterality: N/A;  . CARDIAC SURGERY    . HIP ARTHROPLASTY Right 11/10/2016   Procedure: ARTHROPLASTY BIPOLAR HIP (HEMIARTHROPLASTY);  Surgeon: Marchia Bond, MD;  Location: [facility];  Service: Orthopedics;  Laterality: Right;  . 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 LITHOTRIPSY      Allergies as of 05/10/2018      Reactions   Lisinopril Other (See Comments)   Reaction:  Respiratory arrest    Spironolactone Anaphylaxis   Vancomycin Anaphylaxis, Rash   Donepezil Diarrhea   Eggs Or Egg-derived Products Diarrhea   Erythromycin Other (See Comments)   Reaction:  Makes pt feel weird    Macrolides And Ketolides Other (See Comments)   Reaction:  Unknown    Penicillins Itching, Other (See Comments)  Has patient had a PCN reaction causing immediate rash, facial/tongue/throat swelling, SOB or lightheadedness with hypotension: No Has patient had a PCN reaction causing severe rash involving mucus membranes or skin necrosis: No Has patient had a PCN reaction that required hospitalization No Has patient had a PCN reaction occurring within the last 10 years:  No If all of the above answers are "NO", then may proceed with Cephalosporin use.   Quinapril Hcl Itching   Angiotensin Receptor Blockers Other (See Comments)   Reaction:  Unknown    Lipitor [atorvastatin] Palpitations   Pentazocine Lactate Other (See Comments)   Reaction:  Unknown    Propoxyphene Hcl Other (See Comments)   Reaction unknown   Sulfonamide Derivatives Other (See Comments)   Reaction:  Unknown       Medication List        Accurate as of 05/10/18 11:59 PM. Always use your most recent med list.          acetaminophen 325 MG tablet Commonly known as:  TYLENOL Take 650 mg by mouth every 6 (six) hours as needed.   albuterol 108 (90 Base) MCG/ACT inhaler Commonly known as:  PROVENTIL HFA;VENTOLIN HFA Inhale 2 puffs into the lungs every 6 (six) hours as needed for wheezing or shortness of breath.   aspirin EC 81 MG tablet Take 81 mg by mouth daily.   atorvastatin 40 MG tablet Commonly known as:  LIPITOR Take 40 mg by mouth daily.   clopidogrel 75 MG tablet Commonly known as:  PLAVIX Take 75 mg by mouth daily.   furosemide 20 MG tablet Commonly known as:  LASIX Take 20 mg by mouth daily.   HUMALOG KWIKPEN 100 UNIT/ML KiwkPen Generic drug:  insulin lispro Inject 4 Units into the skin 3 (three) times daily with meals.   insulin glargine 100 UNIT/ML injection Commonly known as:  LANTUS Inject 15 Units into the skin at bedtime. If blood sugar is > 150   isosorbide mononitrate 30 MG 24 hr tablet Commonly known as:  IMDUR Take 0.5 tablets (15 mg total) by mouth daily.   memantine 10 MG tablet Commonly known as:  NAMENDA Take 10 mg by mouth 2 (two) times daily.   metFORMIN 1000 MG tablet Commonly known as:  GLUCOPHAGE Take 1,000 mg by mouth 2 (two) times daily with a meal.   metoprolol succinate 25 MG 24 hr tablet Commonly known as:  TOPROL XL Take 0.5 tablets (12.5 mg total) by mouth daily.   mirtazapine 15 MG disintegrating tablet Commonly known  as:  REMERON SOL-TAB Take 1 tablet (15 mg total) by mouth at bedtime.   mometasone-formoterol 100-5 MCG/ACT Aero Commonly known as:  DULERA Inhale 2 puffs into the lungs 2 (two) times daily.   nitroGLYCERIN 0.4 MG SL tablet Commonly known as:  NITROSTAT Place 0.4 mg under the tongue every 5 (five) minutes as needed for chest pain.   pantoprazole 40 MG tablet Commonly known as:  PROTONIX Take 40 mg by mouth daily.   polyethylene glycol packet Commonly known as:  MIRALAX / GLYCOLAX Take 17 g by mouth daily as needed for mild constipation.   sennosides-docusate sodium 8.6-50 MG tablet Commonly known as:  SENOKOT-S Take 2 tablets by mouth daily.   traMADol 50 MG tablet Commonly known as:  ULTRAM Take 50 mg by mouth 2 (two) times daily. Back pain   traZODone 50 MG tablet Commonly known as:  DESYREL Take 25 mg by mouth at bedtime.  No orders of the defined types were placed in this encounter.   Immunization History  Administered Date(s) Administered  . Influenza Split 08/20/2014  . Influenza, High Dose Seasonal PF 10/06/2013  . Influenza-Unspecified 10/17/2017  . PPD Test 11/16/2016, 12/05/2016  . Pneumococcal Conjugate-13 10/17/2017  . Pneumococcal Polysaccharide-23 09/24/2009  . Tdap 10/18/2017    Social History   Tobacco Use  . Smoking status: Former Smoker    Packs/day: 1.00    Years: 25.00    Pack years: 25.00    Types: Cigarettes  . Smokeless tobacco: Never Used  . Tobacco comment: over a ppd for 40+ years; quit 10/2004.  unsuccessfully tried eCigs.  Substance Use Topics  . Alcohol use: No    Alcohol/week: 0.6 oz    Types: 1 Standard drinks or equivalent per week    Review of Systems  DATA OBTAINED: from patient-very limited; nursing-no acute concerns GENERAL:  no fevers, fatigue, appetite changes SKIN: No itching, rash HEENT: No complaint RESPIRATORY: No cough, wheezing, SOB CARDIAC: No chest pain, palpitations, lower extremity edema  GI:  No abdominal pain, No N/V/D or constipation, No heartburn or reflux  GU: No dysuria, frequency or urgency, or incontinence  MUSCULOSKELETAL: No unrelieved bone/joint pain NEUROLOGIC: No headache, dizziness  PSYCHIATRIC: No overt anxiety or sadness  Vitals:   05/10/18 0908  BP: (!) 145/72  Pulse: 75  Resp: 18  Temp: 97.8 F (36.6 C)   Body mass index is 21.39 kg/m. Physical Exam  GENERAL APPEARANCE: Alert, conversant, No acute distress  SKIN: No diaphoresis rash HEENT: Unremarkable RESPIRATORY: Breathing is even, unlabored. Lung sounds are clear   CARDIOVASCULAR: Heart RRR no murmurs, rubs or gallops. No peripheral edema  GASTROINTESTINAL: Abdomen is soft, non-tender, not distended w/ normal bowel sounds.  GENITOURINARY: Bladder non tender, not distended  MUSCULOSKELETAL: No abnormal joints or musculature NEUROLOGIC: Cranial nerves 2-12 grossly intact. Moves all extremities PSYCHIATRIC: Mood and affect with dementia, no behavioral issues  Patient Active Problem List   Diagnosis Date Noted  . GERD (gastroesophageal reflux disease) 08/04/2017  . Dysphagia 05/10/2017  . Depression 03/14/2017  . Vitamin D deficiency 01/28/2017  . UTI due to extended-spectrum beta lactamase (ESBL) producing Escherichia coli 11/18/2016  . Urinary tract infection due to Proteus 11/18/2016  . Postoperative anemia due to acute blood loss 11/18/2016  . HTN (hypertension) 11/18/2016  . Chronic diastolic CHF (congestive heart failure) ([place]) 11/12/2016  . Closed right hip fracture ([place]) 11/10/2016  . 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 Obesity (BMI 30-39.9) 10/06/2015  . Tobacco use disorder 03/17/2015  . Cardiomyopathy, ischemic 03/17/2015  . Allergy to ACE inhibitors 03/17/2015  . Ventricular tachycardia ([place]) 03/17/2015  . HLD (hyperlipidemia) 12/31/2014  . COPD (chronic obstructive pulmonary disease) ([place]) 12/31/2014  . Chronic cough 09/12/2013  . Memory deficit  04/23/2013  . Essential hypertension, benign 04/03/2013  . Dyslipidemia 04/03/2013  . Coronary atherosclerosis of native coronary artery 04/03/2013  . DM2 (diabetes mellitus, type 2) ([place]) 04/02/2013  . Fibromyalgia 04/20/2008    CMP     Component Value Date/Time   NA 148 (A) 04/18/2018   K 3.2 (A) 04/18/2018   CL 103 11/16/2016 0522   CO2 31 11/16/2016 0522   GLUCOSE 220 (H) 11/16/2016 0522   BUN 14 04/18/2018   CREATININE 0.7 04/18/2018   CREATININE 0.77 11/16/2016 0522   CALCIUM 8.2 (L) 11/16/2016 0522   PROT 7.7 04/27/2016 1724   ALBUMIN 4.4 04/27/2016 1724   AST 14 04/18/2018  ALT 7 04/18/2018   ALKPHOS 87 04/18/2018   BILITOT 1.2 04/27/2016 1724   GFRNONAA >60 11/16/2016 0522   GFRAA >60 11/16/2016 0522   Recent Labs    10/23/17 04/18/18  NA  --  148*  K  --  3.2*  BUN 19 14  CREATININE 0.6 0.7   Recent Labs    10/23/17 04/18/18  AST 15 14  ALT 5* 7  ALKPHOS 107 87   Recent Labs    10/23/17 04/18/18  WBC 8.0 12.9  HGB 13.4 14.8  HCT 40 44  PLT 198 175   Recent Labs    10/23/17  CHOL 137  LDLCALC 78  TRIG 132   Lab Results  Component Value Date   MICROALBUR 1.9 06/22/2017   Lab Results  Component Value Date   TSH 3.25 04/18/2018   Lab Results  Component Value Date   HGBA1C 6.1 04/18/2018   Lab Results  Component Value Date   CHOL 137 10/23/2017   HDL 33 (A) 10/23/2017   LDLCALC 78 10/23/2017   TRIG 132 10/23/2017   CHOLHDL 2 12/19/2012    Significant Diagnostic Results in last 30 days:  No results found.  Assessment and Plan  GERD (gastroesophageal reflux disease) Plus reported; continue Protonix 40 mg daily  Dysphagia Chronic and stable; continue pured diet  COPD (chronic obstructive pulmonary disease) (HCC) Stable and controlled; continue Dulera 100-5 2 puffs twice daily     Shasta Chinn D. [name], MD

## 2018-05-12 ENCOUNTER — Encounter: Payer: Self-pay | Admitting: Internal Medicine

## 2018-05-12 NOTE — Assessment & Plan Note (Signed)
Controlled; continue Imdur 15 mg daily, Toprol-XL 12.5 mg daily and Lasix 40 mg daily

## 2018-05-12 NOTE — Assessment & Plan Note (Signed)
Stable; continue Imdur 50 mg daily, Toprol-XL 12.5 mg daily, ASA 81 mg daily, Plavix 75 mg daily, and nitroglycerin sublingual as needed; patient is on statin

## 2018-05-12 NOTE — Assessment & Plan Note (Signed)
Able; continue Lasix 40 mg daily and Toprol-XL 12.5 mg daily

## 2018-06-01 ENCOUNTER — Encounter: Payer: Self-pay | Admitting: Internal Medicine

## 2018-06-01 NOTE — Assessment & Plan Note (Signed)
Stable and controlled; continue Dulera 100-5 2 puffs twice daily

## 2018-06-01 NOTE — Assessment & Plan Note (Signed)
Plus reported; continue Protonix 40 mg daily

## 2018-06-01 NOTE — Assessment & Plan Note (Signed)
Chronic and stable; continue pured diet. 

## 2018-06-05 ENCOUNTER — Non-Acute Institutional Stay (SKILLED_NURSING_FACILITY): Payer: Medicare Other | Admitting: Internal Medicine

## 2018-06-05 ENCOUNTER — Encounter: Payer: Self-pay | Admitting: Internal Medicine

## 2018-06-05 DIAGNOSIS — F0390 Unspecified dementia without behavioral disturbance: Secondary | ICD-10-CM | POA: Diagnosis not present

## 2018-06-05 DIAGNOSIS — F32A Depression, unspecified: Secondary | ICD-10-CM

## 2018-06-05 DIAGNOSIS — E119 Type 2 diabetes mellitus without complications: Secondary | ICD-10-CM

## 2018-06-05 DIAGNOSIS — F329 Major depressive disorder, single episode, unspecified: Secondary | ICD-10-CM

## 2018-06-05 NOTE — Progress Notes (Signed)
Location:  [facility] Room Number: 202D Place of Service:  SNF [phone])  [name]. Kathleen Coil, MD  Patient Care Team: Hennie Duos, MD as PCP - General (Internal Medicine)  Extended Emergency Contact Information Primary Emergency Contact: [name],[name] Address: [address]          [place], [place] [address] [name] of [place] Phone: [phone] Work Phone: [phone] Mobile Phone: [phone] Relation: Daughter Secondary Emergency Contact: [name],[name]  [place] Phone: [phone] Relation: Niece    Allergies: Lisinopril; Spironolactone; Vancomycin; Donepezil; Eggs or egg-derived products; Erythromycin; Macrolides and ketolides; Penicillins; Quinapril hcl; Angiotensin receptor blockers; Lipitor [atorvastatin]; Pentazocine lactate; Propoxyphene hcl; and Sulfonamide derivatives  Chief Complaint  Patient presents with  . Medical Management of Chronic Issues    Routine Visit    HPI: Patient is 77 y.o. female who is being seen for routine issues of dementia, diabetes mellitus type 2, and depression.  Past Medical History:  Diagnosis Date  . Allergy to ACE inhibitors 03/17/2015  . Anxiety   . CAD (coronary artery disease)    a. s/p inferior STEMI 12/29/10 with rotablator atherectomy RCA 12/31/10 and DES x 2 RCA;  b. Lexiscan Myoview (02/2015): mild reversible apical anterior perfusion defect. C. cath 02/2015 50% LAD lesion with patent stent, Tx Rx    . Cardiomyopathy, ischemic 03/17/2015  . Chronic diastolic CHF (congestive heart failure) ([place]) 11/12/2016  . COPD (chronic obstructive pulmonary disease) ([place])   . Coronary atherosclerosis of native coronary artery 04/03/2013  . Dementia   . Depression 03/14/2017  . Diabetes mellitus    type 2  . Diverticulosis   . DM2 (diabetes mellitus, type 2) ([place]) 04/02/2013  . Dysphagia 05/10/2017   Minimal 6/20 - pureed diet  . Fibromyalgia 04/20/2008   Qualifier: Diagnosis of   By: Nelson-Smith CMA (AAMA), Dottie    . GERD (gastroesophageal reflux disease)   . Glaucoma   . HTN (hypertension)   . Hyperlipidemia   . Ischemic cardiomyopathy    EF 45%cath 2012 EF55% 5/12; 35% 11/14  . Memory deficit   . Nephrolithiasis   . Obesity (BMI 30-39.9) 10/06/2015  . Osteoarthritis   . Overweight(278.02)   . Respiratory arrest//ACE Inhibitor presumed cause   . 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 Sleep apnea   . Small bowel obstruction (HCC)    from a sigmoid stricture  . Ventricular tachycardia ([place]) 03/17/2015  . Vitamin D deficiency 01/28/2017    Past Surgical History:  Procedure Laterality Date  . bilateral tubal ligation    . CARDIAC CATHETERIZATION N/A 03/18/2015   Procedure: LEFT HEART CATH AND CORONARY ANGIOGRAPHY;  Surgeon: Lorretta Harp, MD;  Location: [facility] CATH LAB;  Service: Cardiovascular;  Laterality: N/A;  . CARDIAC SURGERY    . HIP ARTHROPLASTY Right 11/10/2016   Procedure: ARTHROPLASTY BIPOLAR HIP (HEMIARTHROPLASTY);  Surgeon: Marchia Bond, MD;  Location: [facility];  Service: Orthopedics;  Laterality: Right;  . 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 LITHOTRIPSY      Allergies as of 06/05/2018      Reactions   Lisinopril Other (See Comments)   Reaction:  Respiratory arrest    Spironolactone Anaphylaxis   Vancomycin Anaphylaxis, Rash   Donepezil Diarrhea   Eggs Or Egg-derived Products Diarrhea   Erythromycin Other (See Comments)   Reaction:  Makes pt feel weird    Macrolides And Ketolides Other (See Comments)   Reaction:  Unknown    Penicillins Itching, Other (See Comments)   Has patient had a PCN reaction causing immediate rash, facial/tongue/throat swelling, SOB or lightheadedness with hypotension: No Has patient had a PCN reaction causing severe rash involving mucus membranes or skin necrosis: No Has patient had a PCN reaction that required hospitalization No Has patient had a PCN  reaction occurring within the last 10 years: No If all of the above answers are "NO", then may proceed with Cephalosporin use.   Quinapril Hcl Itching   Angiotensin Receptor Blockers Other (See Comments)   Reaction:  Unknown    Lipitor [atorvastatin] Palpitations   Pentazocine Lactate Other (See Comments)   Reaction:  Unknown    Propoxyphene Hcl Other (See Comments)   Reaction unknown   Sulfonamide Derivatives Other (See Comments)   Reaction:  Unknown       Medication List        Accurate as of 06/05/18 11:59 PM. Always use your most recent med list.          acetaminophen 325 MG tablet Commonly known as:  TYLENOL Take 650 mg by mouth every 6 (six) hours as needed.   albuterol 108 (90 Base) MCG/ACT inhaler Commonly known as:  PROVENTIL HFA;VENTOLIN HFA Inhale 2 puffs into the lungs every 6 (six) hours as needed for wheezing or shortness of breath.   aspirin EC 81 MG tablet Take 81 mg by mouth daily.   atorvastatin 40 MG tablet Commonly known as:  LIPITOR Take 40 mg by mouth daily.   clopidogrel 75 MG tablet Commonly known as:  PLAVIX Take 75 mg by mouth daily.   furosemide 20 MG tablet Commonly known as:  LASIX Take 20 mg by mouth daily.   HUMALOG KWIKPEN 100 UNIT/ML KiwkPen Generic drug:  insulin lispro Inject 4 Units into the skin 3 (three) times daily with meals.   insulin glargine 100 UNIT/ML injection Commonly known as:  LANTUS Inject 15 Units into the skin at bedtime. If blood sugar is > 150   isosorbide mononitrate 30 MG 24 hr tablet Commonly known as:  IMDUR Take 0.5 tablets (15 mg total) by mouth daily.   memantine 10 MG tablet Commonly known as:  NAMENDA Take 10 mg by mouth 2 (two) times daily.   metFORMIN 1000 MG tablet Commonly known as:  GLUCOPHAGE Take 1,000 mg by mouth 2 (two) times daily with a meal.   metoprolol succinate 25 MG 24 hr tablet Commonly known as:  TOPROL-XL Take 0.5 tablets (12.5 mg total) by mouth daily.   mirtazapine  15 MG disintegrating tablet Commonly known as:  REMERON SOL-TAB Take 1 tablet (15 mg total) by mouth at bedtime.   mometasone-formoterol 100-5 MCG/ACT Aero Commonly known as:  DULERA Inhale 2 puffs into the lungs 2 (two) times daily.   nitroGLYCERIN 0.4 MG SL tablet Commonly known as:  NITROSTAT Place 0.4 mg under the tongue every 5 (five) minutes as needed for chest pain.   pantoprazole 40 MG tablet Commonly known as:  PROTONIX Take 40 mg by mouth daily.   polyethylene glycol packet Commonly known as:  MIRALAX / GLYCOLAX Take 17 g by mouth daily as needed for mild constipation.   sennosides-docusate sodium 8.6-50 MG tablet Commonly known as:  SENOKOT-S Take 2 tablets by mouth daily.   traMADol 50 MG tablet Commonly known as:  ULTRAM Take 50 mg by mouth 2 (two) times daily. Back pain   traZODone 50 MG tablet Commonly known as:  DESYREL Take 25  mg by mouth at bedtime.       No orders of the defined types were placed in this encounter.   Immunization History  Administered Date(s) Administered  . Influenza Split 08/20/2014  . Influenza, High Dose Seasonal PF 10/06/2013  . Influenza-Unspecified 10/17/2017  . PPD Test 11/16/2016, 12/05/2016  . Pneumococcal Conjugate-13 10/17/2017  . Pneumococcal Polysaccharide-23 09/24/2009  . Tdap 10/18/2017    Social History   Tobacco Use  . Smoking status: Former Smoker    Packs/day: 1.00    Years: 25.00    Pack years: 25.00    Types: Cigarettes  . Smokeless tobacco: Never Used  . Tobacco comment: over a ppd for 40+ years; quit 10/2004.  unsuccessfully tried eCigs.  Substance Use Topics  . Alcohol use: No    Alcohol/week: 1.0 standard drinks    Types: 1 Standard drinks or equivalent per week    Review of Systems  DATA OBTAINED: from patient is very limited; nursing- concern for recent decline GENERAL:  no fevers, fatigue, appetite changes SKIN: No itching, rash HEENT: No complaint RESPIRATORY: No cough, wheezing,  SOB CARDIAC: No chest pain, palpitations, lower extremity edema  GI: No abdominal pain, No N/V/D or constipation, No heartburn or reflux  GU: No dysuria, frequency or urgency, or incontinence  MUSCULOSKELETAL: No unrelieved bone/joint pain NEUROLOGIC: No headache, dizziness  PSYCHIATRIC: No overt anxiety or sadness  Vitals:   06/05/18 1344  BP: 125/70  Pulse: 78  Resp: 18  Temp: 98 F (36.7 C)   Body mass index is 19.74 kg/m. Physical Exam  GENERAL APPEARANCE: Alert, conversant, No acute distress  SKIN: No diaphoresis rash HEENT: Unremarkable RESPIRATORY: Breathing is even, unlabored. Lung sounds are clear   CARDIOVASCULAR: Heart RRR no murmurs, rubs or gallops. No peripheral edema  GASTROINTESTINAL: Abdomen is soft, non-tender, not distended w/ normal bowel sounds.  GENITOURINARY: Bladder non tender, not distended  MUSCULOSKELETAL: No abnormal joints or musculature NEUROLOGIC: Cranial nerves 2-12 grossly intact. Moves all extremities PSYCHIATRIC: Flat affect, dementia,, no behavioral issues  Patient Active Problem List   Diagnosis Date Noted  . GERD (gastroesophageal reflux disease) 08/04/2017  . Dysphagia 05/10/2017  . Depression 03/14/2017  . Vitamin D deficiency 01/28/2017  . UTI due to extended-spectrum beta lactamase (ESBL) producing Escherichia coli 11/18/2016  . Urinary tract infection due to Proteus 11/18/2016  . Postoperative anemia due to acute blood loss 11/18/2016  . HTN (hypertension) 11/18/2016  . Chronic diastolic CHF (congestive heart failure) ([place]) 11/12/2016  . Closed right hip fracture ([place]) 11/10/2016  . 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 Obesity (BMI 30-39.9) 10/06/2015  . Tobacco use disorder 03/17/2015  . Cardiomyopathy, ischemic 03/17/2015  . Allergy to ACE inhibitors 03/17/2015  . Ventricular tachycardia ([place]) 03/17/2015  . HLD (hyperlipidemia) 12/31/2014  . COPD (chronic obstructive pulmonary disease) ([place])  12/31/2014  . Chronic cough 09/12/2013  . Memory deficit 04/23/2013  . Essential hypertension, benign 04/03/2013  . Dyslipidemia 04/03/2013  . Coronary atherosclerosis of native coronary artery 04/03/2013  . DM2 (diabetes mellitus, type 2) ([place]) 04/02/2013  . Fibromyalgia 04/20/2008    CMP     Component Value Date/Time   NA 148 (A) 04/18/2018   K 3.2 (A) 04/18/2018   CL 103 11/16/2016 0522   CO2 31 11/16/2016 0522   GLUCOSE 220 (H) 11/16/2016 0522   BUN 14 04/18/2018   CREATININE 0.7 04/18/2018   CREATININE 0.77 11/16/2016 0522   CALCIUM 8.2 (L) 11/16/2016 0522   PROT 7.7 04/27/2016 1724  ALBUMIN 4.4 04/27/2016 1724   AST 14 04/18/2018   ALT 7 04/18/2018   ALKPHOS 87 04/18/2018   BILITOT 1.2 04/27/2016 1724   GFRNONAA >60 11/16/2016 0522   GFRAA >60 11/16/2016 0522   Recent Labs    10/23/17 04/18/18  NA  --  148*  K  --  3.2*  BUN 19 14  CREATININE 0.6 0.7   Recent Labs    10/23/17 04/18/18  AST 15 14  ALT 5* 7  ALKPHOS 107 87   Recent Labs    10/23/17 04/18/18  WBC 8.0 12.9  HGB 13.4 14.8  HCT 40 44  PLT 198 175   Recent Labs    10/23/17  CHOL 137  LDLCALC 78  TRIG 132   Lab Results  Component Value Date   MICROALBUR 1.9 06/22/2017   Lab Results  Component Value Date   TSH 3.25 04/18/2018   Lab Results  Component Value Date   HGBA1C 6.1 04/18/2018   Lab Results  Component Value Date   CHOL 137 10/23/2017   HDL 33 (A) 10/23/2017   LDLCALC 78 10/23/2017   TRIG 132 10/23/2017   CHOLHDL 2 12/19/2012    Significant Diagnostic Results in last 30 days:  No results found.  Assessment and Plan  Senile dementia without behavioral disturbance Recently some declines; continue Namenda 10 mg twice daily  Diabetes mellitus without complication (HCC) Y8M 6.1, excellent control; continue Glucophage 1000 mg twice daily, Lantus 15 units at bedtime and and Humalog 4 units with every meal; patient is on statin  Depression Stable; continue Zoloft  25 mg nightly and Remeron 15 mg nightly     Bodhi Moradi D. Kathleen Coil, MD

## 2018-06-07 IMAGING — DX DG HIP (WITH OR WITHOUT PELVIS) 2-3V*R*
3 series · 3 of 3 positions shown · non-contrast
Comparison: Coronal and sagittal images from an abdominal and
pelvic CT scan dated April 27, 2016

CLINICAL DATA: Status post fall in the 9 hall today. Persistent
right anterior hip pain made worse with movement.

EXAM:
DG HIP (WITH OR WITHOUT PELVIS) 2-3V RIGHT

[pelvis ap]
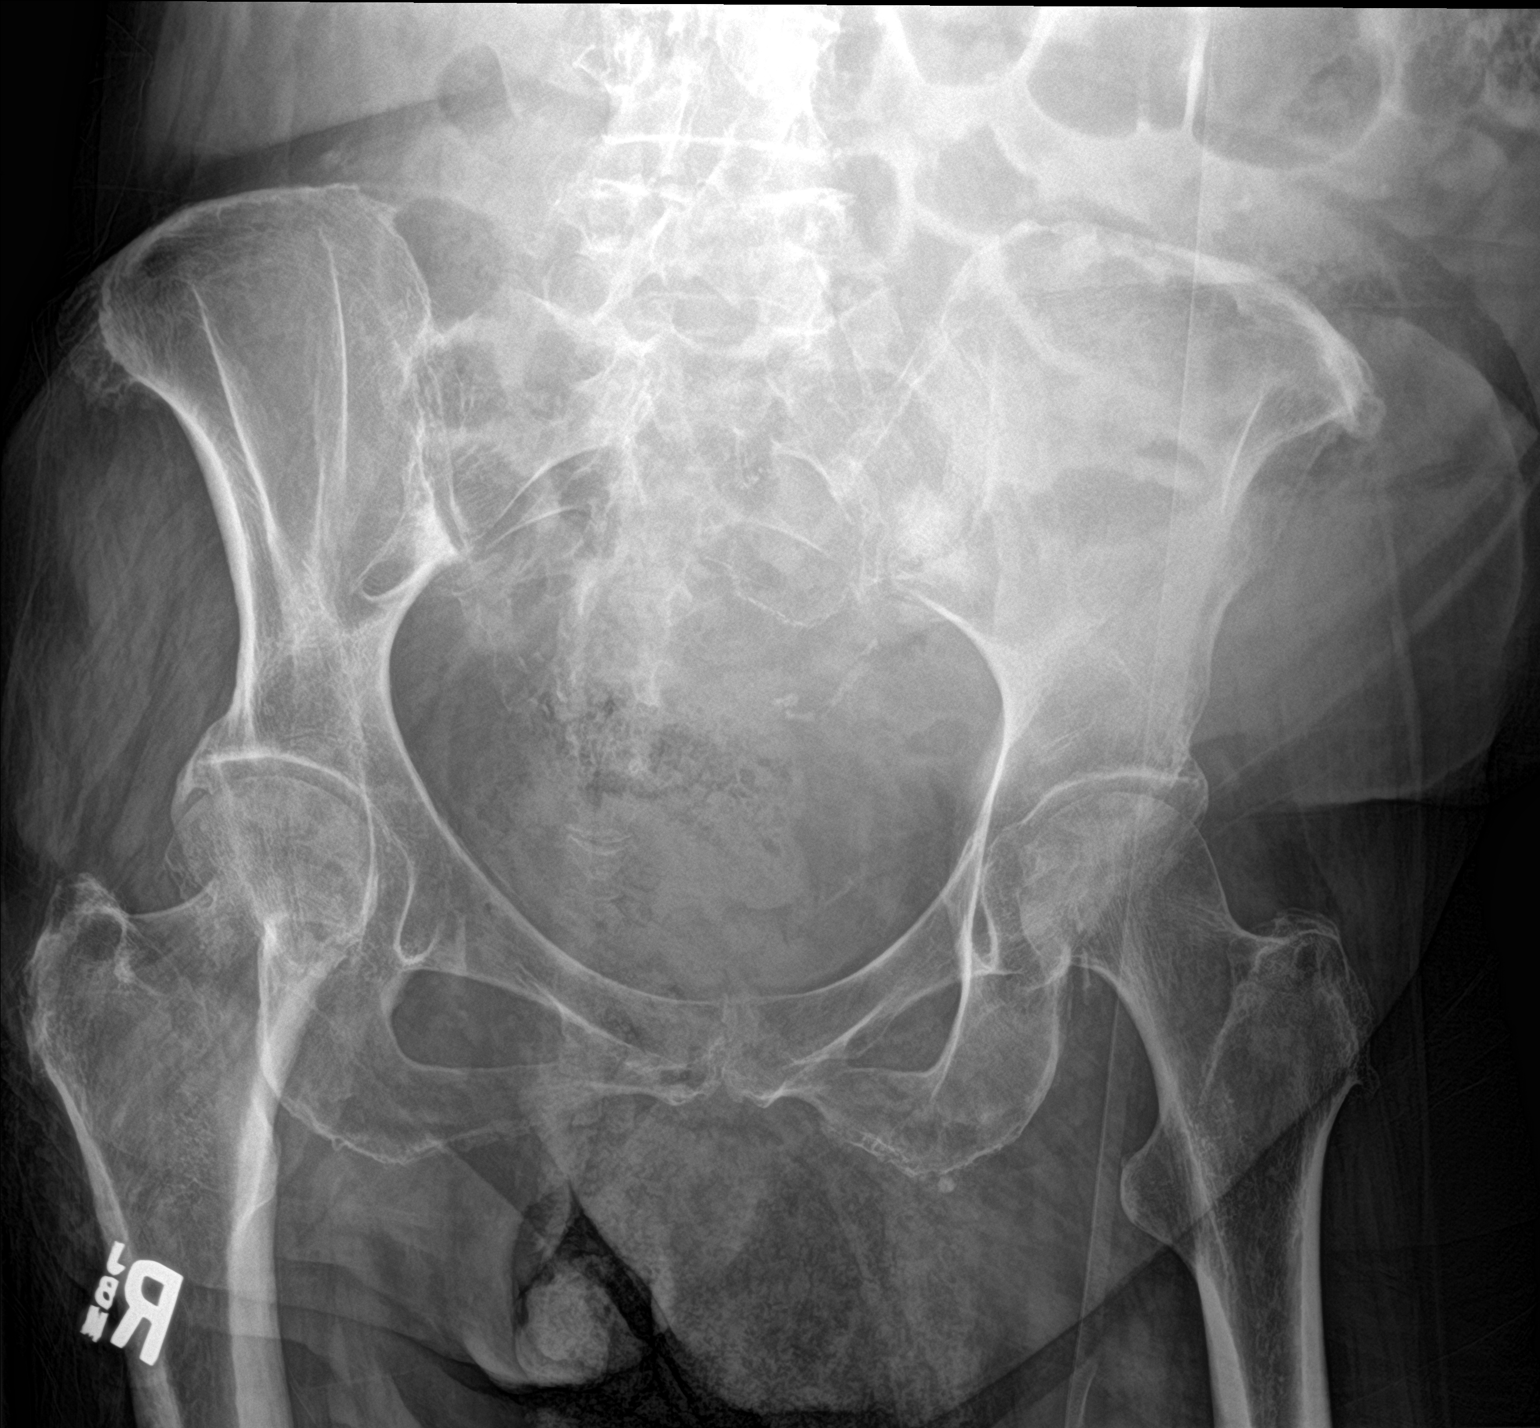

[hip ap]
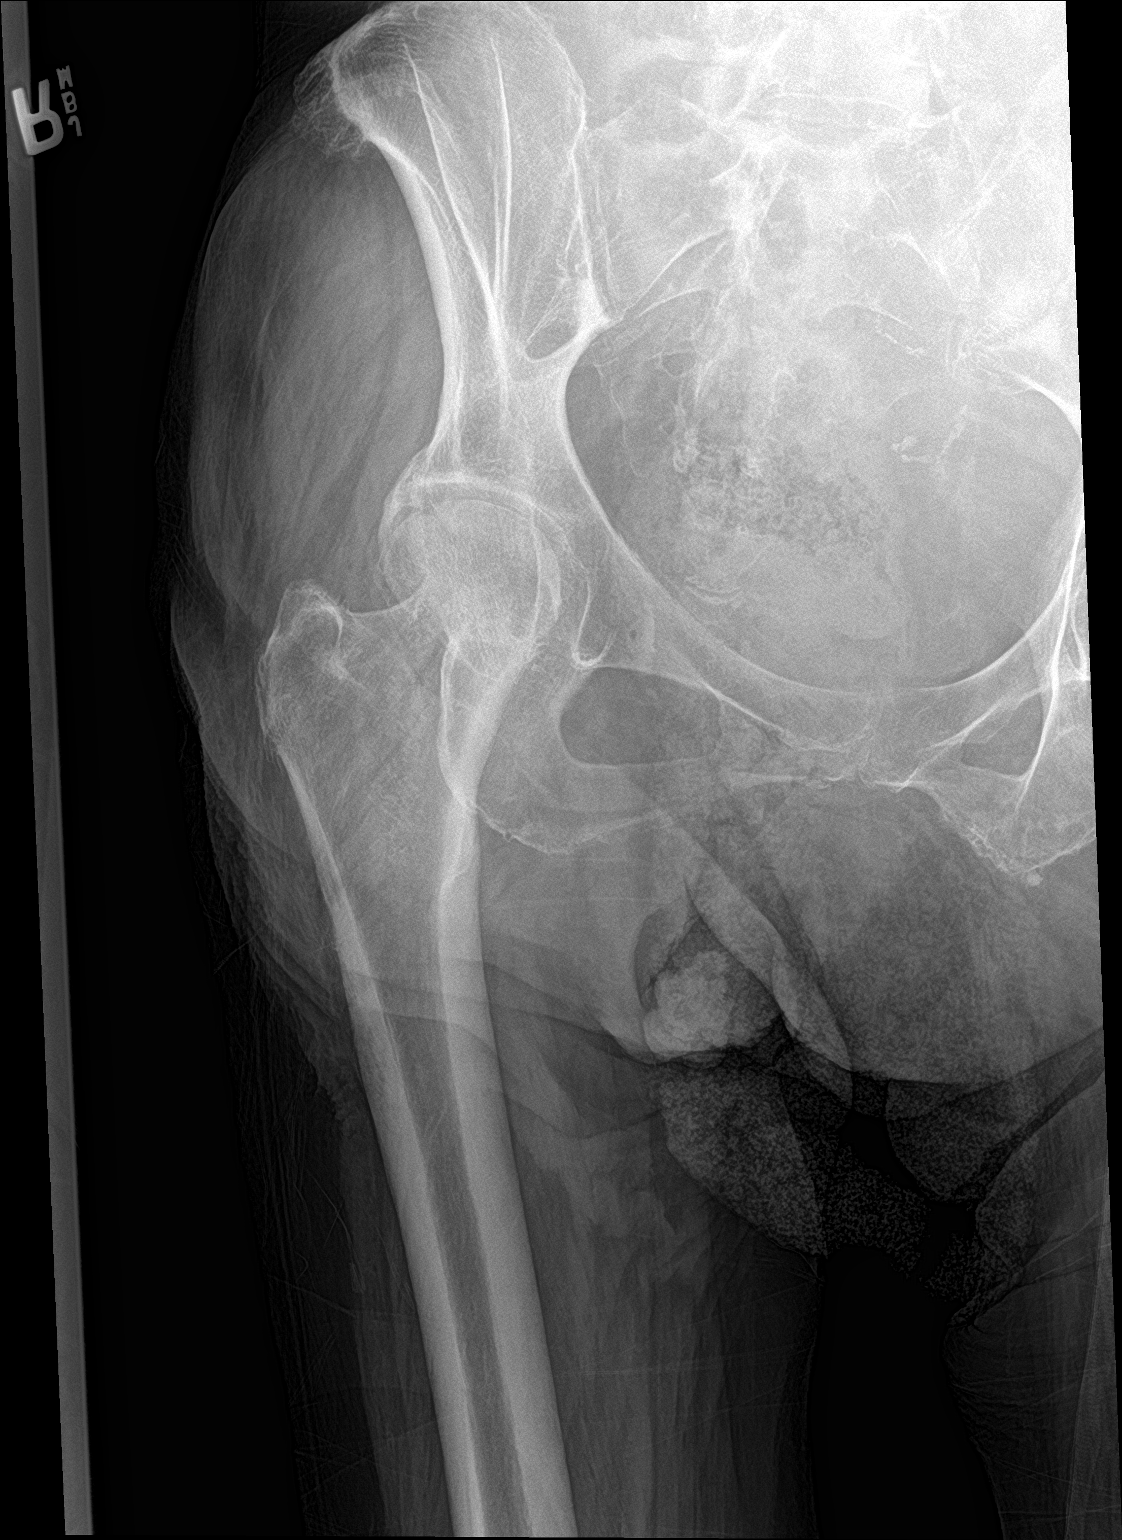

[hip lat]
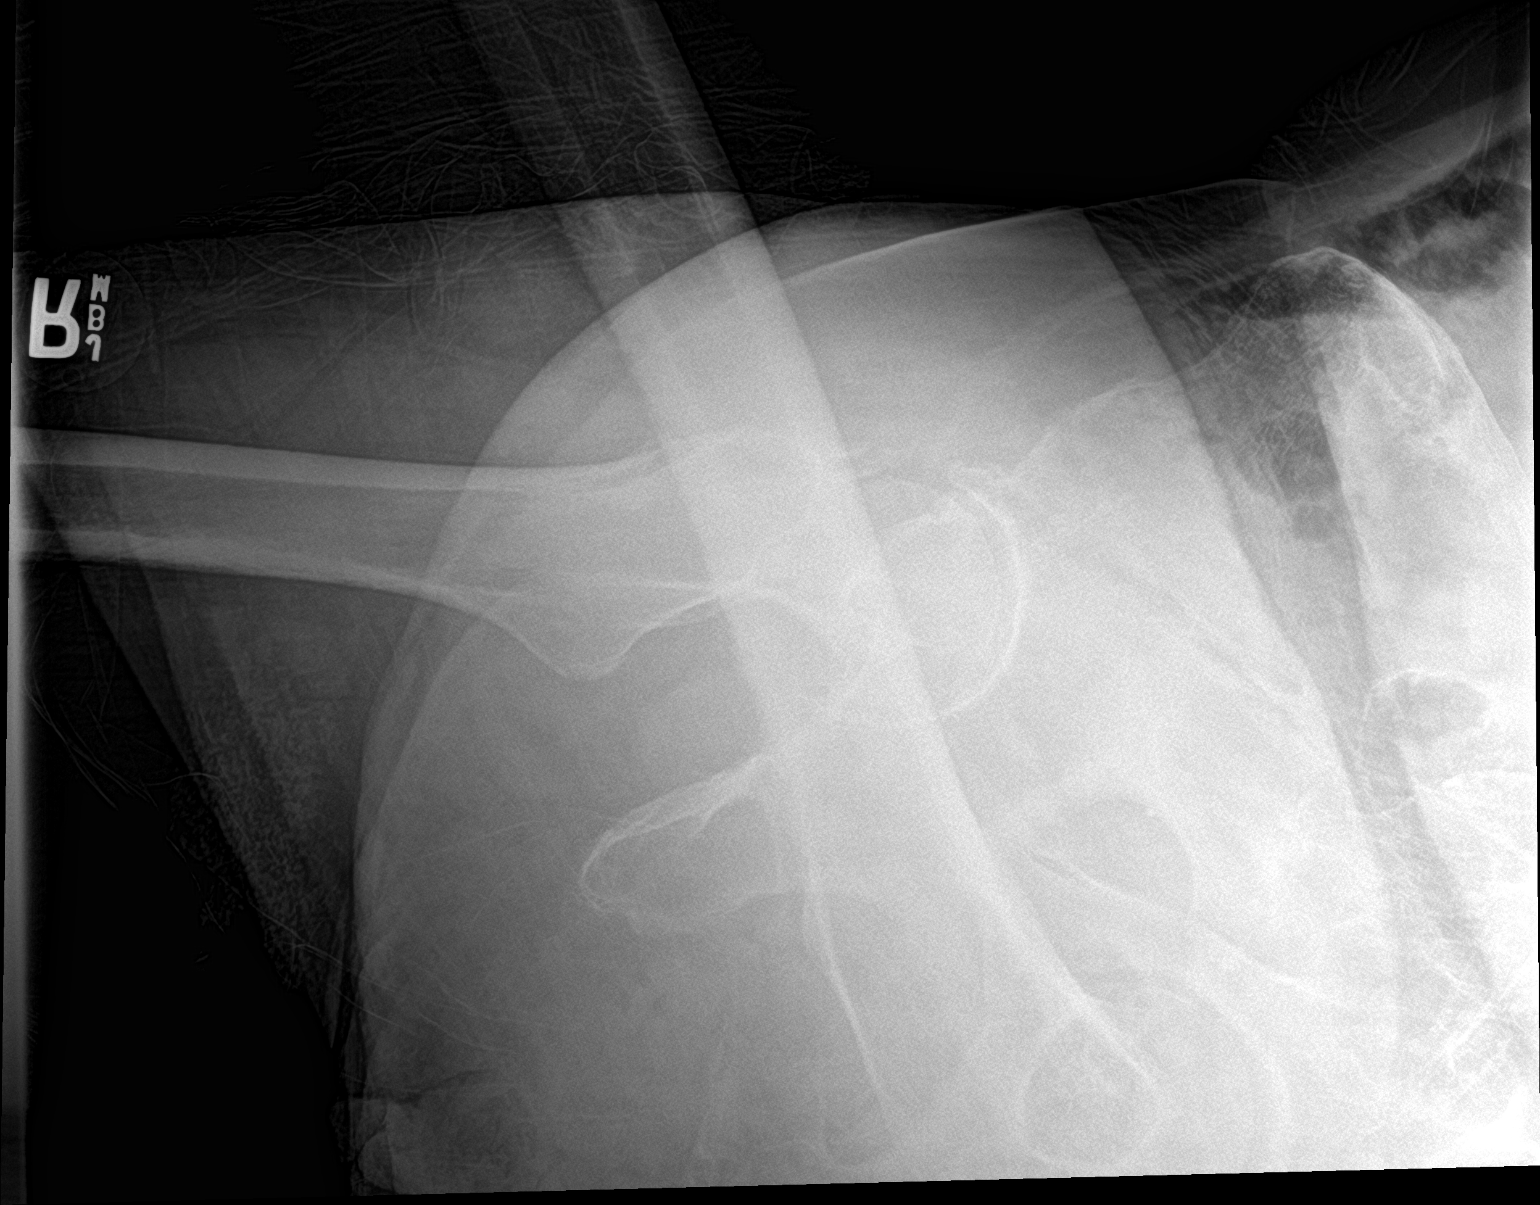

[3 of 3 positions shown; findings below may reference images not displayed]

FINDINGS: There is an acute impacted fracture of the subcapital region of the
right hip. The femoral head remains normally related to the right
hip joint. The joint space is well maintained. The intertrochanteric
and sub trochanteric regions are normal. The observed bony pelvis
exhibits no acute fracture. There is diffuse osteopenia.
IMPRESSION: There is an acute impacted subcapital fracture of the right hip.

## 2018-06-28 DIAGNOSIS — R112 Nausea with vomiting, unspecified: Secondary | ICD-10-CM | POA: Diagnosis not present

## 2018-06-28 DIAGNOSIS — R1084 Generalized abdominal pain: Secondary | ICD-10-CM | POA: Diagnosis not present

## 2018-06-30 ENCOUNTER — Encounter: Payer: Self-pay | Admitting: Internal Medicine

## 2018-06-30 NOTE — Assessment & Plan Note (Signed)
A1c 6.1, excellent control; continue Glucophage 1000 mg twice daily, Lantus 15 units at bedtime and and Humalog 4 units with every meal; patient is on statin

## 2018-06-30 NOTE — Assessment & Plan Note (Signed)
Recently some declines; continue Namenda 10 mg twice daily

## 2018-06-30 NOTE — Assessment & Plan Note (Signed)
Stable; continue Zoloft 25 mg nightly and Remeron 15 mg nightly

## 2018-07-09 DIAGNOSIS — R05 Cough: Secondary | ICD-10-CM | POA: Diagnosis not present

## 2018-07-12 ENCOUNTER — Encounter: Payer: Self-pay | Admitting: Internal Medicine

## 2018-07-12 ENCOUNTER — Non-Acute Institutional Stay (SKILLED_NURSING_FACILITY): Payer: Medicare Other | Admitting: Internal Medicine

## 2018-07-12 DIAGNOSIS — E559 Vitamin D deficiency, unspecified: Secondary | ICD-10-CM | POA: Diagnosis not present

## 2018-07-12 DIAGNOSIS — I70213 Atherosclerosis of native arteries of extremities with intermittent claudication, bilateral legs: Secondary | ICD-10-CM

## 2018-07-12 DIAGNOSIS — E785 Hyperlipidemia, unspecified: Secondary | ICD-10-CM

## 2018-07-12 NOTE — Progress Notes (Signed)
Location:  [facility] Room Number: 202D Place of Service:  SNF [phone])  [name]. [name], MD  Patient Care Team: Hennie Duos, MD as PCP - General (Internal Medicine)  Extended Emergency Contact Information Primary Emergency Contact: [name],[name] Address: [address]          [place], [place] [address] [name] of [place] Phone: [phone] Work Phone: [phone] Mobile Phone: [phone] Relation: Daughter Secondary Emergency Contact: [name],[name]  [place] Phone: [phone] Relation: Niece    Allergies: Lisinopril; Spironolactone; Vancomycin; Donepezil; Eggs or egg-derived products; Erythromycin; Macrolides and ketolides; Penicillins; Quinapril hcl; Angiotensin receptor blockers; Lipitor [atorvastatin]; Pentazocine lactate; Propoxyphene hcl; and Sulfonamide derivatives  Chief Complaint  Patient presents with  . Medical Management of Chronic Issues    Routine Visit    HPI: Patient is 77 y.o. female who being seen for routine issues of peripheral vascular disease, hyperlipidemia, vitamin D deficiency.  Past Medical History:  Diagnosis Date  . Allergy to ACE inhibitors 03/17/2015  . Anxiety   . CAD (coronary artery disease)    a. s/p inferior STEMI 12/29/10 with rotablator atherectomy RCA 12/31/10 and DES x 2 RCA;  b. Lexiscan Myoview (02/2015): mild reversible apical anterior perfusion defect. C. cath 02/2015 50% LAD lesion with patent stent, Tx Rx    . Cardiomyopathy, ischemic 03/17/2015  . Chronic diastolic CHF (congestive heart failure) ([place]) 11/12/2016  . COPD (chronic obstructive pulmonary disease) ([facility])   . Coronary atherosclerosis of native coronary artery 04/03/2013  . Dementia   . Depression 03/14/2017  . Diabetes mellitus    type 2  . Diverticulosis   . DM2 (diabetes mellitus, type 2) ([place]) 04/02/2013  . Dysphagia 05/10/2017   Minimal 6/20 - pureed diet  . Fibromyalgia 04/20/2008   Qualifier:  Diagnosis of  By: Nelson-Smith CMA (AAMA), Dottie    . GERD (gastroesophageal reflux disease)   . Glaucoma   . HTN (hypertension)   . Hyperlipidemia   . Ischemic cardiomyopathy    EF 45%cath 2012 EF55% 5/12; 35% 11/14  . Memory deficit   . Nephrolithiasis   . Obesity (BMI 30-39.9) 10/06/2015  . Osteoarthritis   . Overweight(278.02)   . Respiratory arrest//ACE Inhibitor presumed cause   . 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 Sleep apnea   . Small bowel obstruction (HCC)    from a sigmoid stricture  . Ventricular tachycardia ([place]) 03/17/2015  . Vitamin D deficiency 01/28/2017    Past Surgical History:  Procedure Laterality Date  . bilateral tubal ligation    . CARDIAC CATHETERIZATION N/A 03/18/2015   Procedure: LEFT HEART CATH AND CORONARY ANGIOGRAPHY;  Surgeon: Lorretta Harp, MD;  Location: [facility] CATH LAB;  Service: Cardiovascular;  Laterality: N/A;  . CARDIAC SURGERY    . HIP ARTHROPLASTY Right 11/10/2016   Procedure: ARTHROPLASTY BIPOLAR HIP (HEMIARTHROPLASTY);  Surgeon: Marchia Bond, MD;  Location: [facility];  Service: Orthopedics;  Laterality: Right;  . 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 LITHOTRIPSY      Allergies as of 07/12/2018      Reactions   Lisinopril Other (See Comments)   Reaction:  Respiratory arrest    Spironolactone Anaphylaxis   Vancomycin Anaphylaxis, Rash   Donepezil Diarrhea   Eggs Or Egg-derived Products Diarrhea   Erythromycin Other (See Comments)   Reaction:  Makes pt feel weird    Macrolides And Ketolides Other (See Comments)   Reaction:  Unknown  Penicillins Itching, Other (See Comments)   Has patient had a PCN reaction causing immediate rash, facial/tongue/throat swelling, SOB or lightheadedness with hypotension: No Has patient had a PCN reaction causing severe rash involving mucus membranes or skin necrosis: No Has patient had a PCN reaction that required hospitalization No Has patient  had a PCN reaction occurring within the last 10 years: No If all of the above answers are "NO", then may proceed with Cephalosporin use.   Quinapril Hcl Itching   Angiotensin Receptor Blockers Other (See Comments)   Reaction:  Unknown    Lipitor [atorvastatin] Palpitations   Pentazocine Lactate Other (See Comments)   Reaction:  Unknown    Propoxyphene Hcl Other (See Comments)   Reaction unknown   Sulfonamide Derivatives Other (See Comments)   Reaction:  Unknown       Medication List        Accurate as of 07/12/18 11:59 PM. Always use your most recent med list.          acetaminophen 325 MG tablet Commonly known as:  TYLENOL Take 650 mg by mouth every 6 (six) hours as needed.   albuterol 108 (90 Base) MCG/ACT inhaler Commonly known as:  PROVENTIL HFA;VENTOLIN HFA Inhale 2 puffs into the lungs every 6 (six) hours as needed for wheezing or shortness of breath.   aspirin EC 81 MG tablet Take 81 mg by mouth daily.   atorvastatin 40 MG tablet Commonly known as:  LIPITOR Take 40 mg by mouth daily.   clopidogrel 75 MG tablet Commonly known as:  PLAVIX Take 75 mg by mouth daily.   furosemide 20 MG tablet Commonly known as:  LASIX Take 20 mg by mouth daily.   HUMALOG KWIKPEN 100 UNIT/ML KiwkPen Generic drug:  insulin lispro Inject 4 Units into the skin 3 (three) times daily with meals.   insulin glargine 100 UNIT/ML injection Commonly known as:  LANTUS Inject 15 Units into the skin at bedtime. If blood sugar is > 150   isosorbide mononitrate 30 MG 24 hr tablet Commonly known as:  IMDUR Take 0.5 tablets (15 mg total) by mouth daily.   memantine 10 MG tablet Commonly known as:  NAMENDA Take 10 mg by mouth 2 (two) times daily.   metFORMIN 1000 MG tablet Commonly known as:  GLUCOPHAGE Take 1,000 mg by mouth 2 (two) times daily with a meal.   metoprolol succinate 25 MG 24 hr tablet Commonly known as:  TOPROL-XL Take 0.5 tablets (12.5 mg total) by mouth daily.     mirtazapine 15 MG disintegrating tablet Commonly known as:  REMERON SOL-TAB Take 1 tablet (15 mg total) by mouth at bedtime.   mometasone-formoterol 100-5 MCG/ACT Aero Commonly known as:  DULERA Inhale 2 puffs into the lungs 2 (two) times daily.   nitroGLYCERIN 0.4 MG SL tablet Commonly known as:  NITROSTAT Place 0.4 mg under the tongue every 5 (five) minutes as needed for chest pain.   pantoprazole 40 MG tablet Commonly known as:  PROTONIX Take 40 mg by mouth daily.   polyethylene glycol packet Commonly known as:  MIRALAX / GLYCOLAX Take 17 g by mouth daily as needed for mild constipation.   sennosides-docusate sodium 8.6-50 MG tablet Commonly known as:  SENOKOT-S Take 2 tablets by mouth daily.   traMADol 50 MG tablet Commonly known as:  ULTRAM Take 50 mg by mouth 2 (two) times daily. Back pain   traZODone 50 MG tablet Commonly known as:  DESYREL Take 25 mg by mouth  at bedtime.       No orders of the defined types were placed in this encounter.   Immunization History  Administered Date(s) Administered  . Influenza Split 08/20/2014  . Influenza, High Dose Seasonal PF 10/06/2013  . Influenza-Unspecified 10/17/2017  . PPD Test 11/16/2016, 12/05/2016  . Pneumococcal Conjugate-13 10/17/2017  . Pneumococcal Polysaccharide-23 09/24/2009  . Tdap 10/18/2017    Social History   Tobacco Use  . Smoking status: Former Smoker    Packs/day: 1.00    Years: 25.00    Pack years: 25.00    Types: Cigarettes  . Smokeless tobacco: Never Used  . Tobacco comment: over a ppd for 40+ years; quit 10/2004.  unsuccessfully tried eCigs.  Substance Use Topics  . Alcohol use: No    Alcohol/week: 1.0 standard drinks    Types: 1 Standard drinks or equivalent per week    Review of Systems  DATA OBTAINED: from patient- limited; nursing-no acute concerns GENERAL:  no fevers, fatigue, appetite changes SKIN: No itching, rash HEENT: No complaint RESPIRATORY: No cough, wheezing,  SOB CARDIAC: No chest pain, palpitations, lower extremity edema  GI: No abdominal pain, No N/V/D or constipation, No heartburn or reflux  GU: No dysuria, frequency or urgency, or incontinence  MUSCULOSKELETAL: No unrelieved bone/joint pain NEUROLOGIC: No headache, dizziness  PSYCHIATRIC: No overt anxiety or sadness  Vitals:   07/12/18 1405  BP: 122/69  Pulse: 73  Resp: 18  Temp: (!) 97.1 F (36.2 C)   Body mass index is 19.74 kg/m. Physical Exam  GENERAL APPEARANCE: Alert, early conversant, No acute distress  SKIN: No diaphoresis rash HEENT: Unremarkable RESPIRATORY: Breathing is even, unlabored. Lung sounds are clear   CARDIOVASCULAR: Heart RRR no murmurs, rubs or gallops. No peripheral edema  GASTROINTESTINAL: Abdomen is soft, non-tender, not distended w/ normal bowel sounds.  GENITOURINARY: Bladder non tender, not distended  MUSCULOSKELETAL: No abnormal joints or musculature NEUROLOGIC: Cranial nerves 2-12 grossly intact. Moves all extremities PSYCHIATRIC: Mood and affect flat, no behavioral issues  Patient Active Problem List   Diagnosis Date Noted  . GERD (gastroesophageal reflux disease) 08/04/2017  . Dysphagia 05/10/2017  . Depression 03/14/2017  . Vitamin D deficiency 01/28/2017  . UTI due to extended-spectrum beta lactamase (ESBL) producing Escherichia coli 11/18/2016  . Urinary tract infection due to Proteus 11/18/2016  . Postoperative anemia due to acute blood loss 11/18/2016  . HTN (hypertension) 11/18/2016  . Chronic diastolic CHF (congestive heart failure) ([facility] [place]) 11/12/2016  . Closed right hip fracture ([place]) 11/10/2016  . 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 Obesity (BMI 30-39.9) 10/06/2015  . Tobacco use disorder 03/17/2015  . Cardiomyopathy, ischemic 03/17/2015  . Allergy to ACE inhibitors 03/17/2015  . Ventricular tachycardia ([place]) 03/17/2015  . HLD (hyperlipidemia) 12/31/2014  . COPD (chronic obstructive pulmonary disease)  ([place]) 12/31/2014  . Chronic cough 09/12/2013  . Memory deficit 04/23/2013  . Essential hypertension, benign 04/03/2013  . Dyslipidemia 04/03/2013  . Coronary atherosclerosis of native coronary artery 04/03/2013  . DM2 (diabetes mellitus, type 2) ([place]) 04/02/2013  . Fibromyalgia 04/20/2008    CMP     Component Value Date/Time   NA 148 (A) 04/18/2018   K 3.2 (A) 04/18/2018   CL 103 11/16/2016 0522   CO2 31 11/16/2016 0522   GLUCOSE 220 (H) 11/16/2016 0522   BUN 14 04/18/2018   CREATININE 0.7 04/18/2018   CREATININE 0.77 11/16/2016 0522   CALCIUM 8.2 (L) 11/16/2016 0522   PROT 7.7 04/27/2016 1724   ALBUMIN 4.4  04/27/2016 1724   AST 14 04/18/2018   ALT 7 04/18/2018   ALKPHOS 87 04/18/2018   BILITOT 1.2 04/27/2016 1724   GFRNONAA >60 11/16/2016 0522   GFRAA >60 11/16/2016 0522   Recent Labs    10/23/17 04/18/18  NA  --  148*  K  --  3.2*  BUN 19 14  CREATININE 0.6 0.7   Recent Labs    10/23/17 04/18/18  AST 15 14  ALT 5* 7  ALKPHOS 107 87   Recent Labs    10/23/17 04/18/18  WBC 8.0 12.9  HGB 13.4 14.8  HCT 40 44  PLT 198 175   Recent Labs    10/23/17  CHOL 137  LDLCALC 78  TRIG 132   Lab Results  Component Value Date   MICROALBUR 1.9 06/22/2017   Lab Results  Component Value Date   TSH 3.25 04/18/2018   Lab Results  Component Value Date   HGBA1C 6.1 04/18/2018   Lab Results  Component Value Date   CHOL 137 10/23/2017   HDL 33 (A) 10/23/2017   LDLCALC 78 10/23/2017   TRIG 132 10/23/2017   CHOLHDL 2 12/19/2012    Significant Diagnostic Results in last 30 days:  No results found.  Assessment and Plan  Atherosclerosis of native artery of extremity with intermittent claudication (HCC) Patient is in a wheelchair chronically so does not really walk, no complaints of pain or skin breakdowns; continue aspirin 81 mg daily and Plavix 75 mg daily: Patient is on statin  HLD (hyperlipidemia) LDL 78, HDL 33, good control; continue Lipitor 40 mg  daily  Vitamin D deficiency Vitamin D level 24, patient is not on vitamin D; will order vitamin D 50,000 units weekly for 20 weeks 50,000 units monthly.    [name]. [name], MD

## 2018-07-15 ENCOUNTER — Encounter: Payer: Self-pay | Admitting: Internal Medicine

## 2018-07-15 NOTE — Assessment & Plan Note (Addendum)
Patient is in a wheelchair chronically so does not really walk, no complaints of pain or skin breakdowns; continue aspirin 81 mg daily and Plavix 75 mg daily: Patient is on statin

## 2018-07-15 NOTE — Assessment & Plan Note (Signed)
Vitamin D level 24, patient is not on vitamin D; will order vitamin D 50,000 units weekly for 20 weeks 50,000 units monthly.

## 2018-07-15 NOTE — Assessment & Plan Note (Signed)
LDL 78, HDL 33, good control; continue Lipitor 40 mg daily

## 2018-07-17 DIAGNOSIS — R1312 Dysphagia, oropharyngeal phase: Secondary | ICD-10-CM | POA: Diagnosis not present

## 2018-07-22 DIAGNOSIS — M797 Fibromyalgia: Secondary | ICD-10-CM | POA: Diagnosis not present

## 2018-07-22 DIAGNOSIS — M6281 Muscle weakness (generalized): Secondary | ICD-10-CM | POA: Diagnosis not present

## 2018-07-22 DIAGNOSIS — R1312 Dysphagia, oropharyngeal phase: Secondary | ICD-10-CM | POA: Diagnosis not present

## 2018-07-22 DIAGNOSIS — R2681 Unsteadiness on feet: Secondary | ICD-10-CM | POA: Diagnosis not present

## 2018-07-22 DIAGNOSIS — I5032 Chronic diastolic (congestive) heart failure: Secondary | ICD-10-CM | POA: Diagnosis not present

## 2018-07-22 DIAGNOSIS — F0390 Unspecified dementia without behavioral disturbance: Secondary | ICD-10-CM | POA: Diagnosis not present

## 2018-07-22 DIAGNOSIS — Z9181 History of falling: Secondary | ICD-10-CM | POA: Diagnosis not present

## 2018-07-22 DIAGNOSIS — R279 Unspecified lack of coordination: Secondary | ICD-10-CM | POA: Diagnosis not present

## 2018-07-24 DIAGNOSIS — F0390 Unspecified dementia without behavioral disturbance: Secondary | ICD-10-CM | POA: Diagnosis not present

## 2018-07-24 DIAGNOSIS — F331 Major depressive disorder, recurrent, moderate: Secondary | ICD-10-CM | POA: Diagnosis not present

## 2018-07-24 DIAGNOSIS — F419 Anxiety disorder, unspecified: Secondary | ICD-10-CM | POA: Diagnosis not present

## 2018-08-06 ENCOUNTER — Encounter: Payer: Self-pay | Admitting: Internal Medicine

## 2018-08-06 DIAGNOSIS — G4700 Insomnia, unspecified: Secondary | ICD-10-CM | POA: Insufficient documentation

## 2018-08-08 ENCOUNTER — Non-Acute Institutional Stay (SKILLED_NURSING_FACILITY): Payer: Medicare Other | Admitting: Internal Medicine

## 2018-08-08 ENCOUNTER — Encounter: Payer: Self-pay | Admitting: Internal Medicine

## 2018-08-08 DIAGNOSIS — I10 Essential (primary) hypertension: Secondary | ICD-10-CM

## 2018-08-08 DIAGNOSIS — I25118 Atherosclerotic heart disease of native coronary artery with other forms of angina pectoris: Secondary | ICD-10-CM | POA: Diagnosis not present

## 2018-08-08 DIAGNOSIS — I5032 Chronic diastolic (congestive) heart failure: Secondary | ICD-10-CM

## 2018-08-08 NOTE — Progress Notes (Signed)
Location:  [facility] Room Number: 202D Place of Service:  SNF [phone])  [name]. [name], MD  Patient Care Team: Hennie Duos, MD as PCP - General (Internal Medicine)  Extended Emergency Contact Information Primary Emergency Contact: [name],Kathleen Address: [address]          [place], [place] [address] [name] of [place] Phone: [phone] Work Phone: [phone] Mobile Phone: [phone] Relation: Daughter Secondary Emergency Contact: [name],Kathleen  [place] Phone: [phone] Relation: Niece    Allergies: Lisinopril; Spironolactone; Vancomycin; Donepezil; Eggs or egg-derived products; Erythromycin; Macrolides and ketolides; Penicillins; Quinapril hcl; Angiotensin receptor blockers; Lipitor [atorvastatin]; Pentazocine lactate; Propoxyphene hcl; and Sulfonamide derivatives  Chief Complaint  Patient presents with  . Medical Management of Chronic Issues    Routine Visit    HPI: Patient is 77 y.o. female who is being seen for routine issues of chronic diastolic congestive heart failure, coronary artery disease, and hypertension.  Past Medical History:  Diagnosis Date  . Allergy to ACE inhibitors 03/17/2015  . Anxiety   . CAD (coronary artery disease)    a. s/p inferior STEMI 12/29/10 with rotablator atherectomy RCA 12/31/10 and DES x 2 RCA;  b. Lexiscan Myoview (02/2015): mild reversible apical anterior perfusion defect. C. cath 02/2015 50% LAD lesion with patent stent, Tx Rx    . Cardiomyopathy, ischemic 03/17/2015  . Chronic diastolic CHF (congestive heart failure) ([place]) 11/12/2016  . COPD (chronic obstructive pulmonary disease) ([place])   . Coronary atherosclerosis of native coronary artery 04/03/2013  . Dementia   . Depression 03/14/2017  . Diabetes mellitus    type 2  . Diverticulosis   . DM2 (diabetes mellitus, type 2) ([place]) 04/02/2013  . Dysphagia 05/10/2017   Minimal 6/20 - pureed diet  . Fibromyalgia  04/20/2008   Qualifier: Diagnosis of  By: Nelson-Smith CMA (AAMA), Dottie    . GERD (gastroesophageal reflux disease)   . Glaucoma   . HTN (hypertension)   . Hyperlipidemia   . Ischemic cardiomyopathy    EF 45%cath 2012 EF55% 5/12; 35% 11/14  . Memory deficit   . Nephrolithiasis   . Obesity (BMI 30-39.9) 10/06/2015  . Osteoarthritis   . Overweight(278.02)   . Respiratory arrest//ACE Inhibitor presumed cause   . 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 Sleep apnea   . Small bowel obstruction (HCC)    from a sigmoid stricture  . Ventricular tachycardia ([place]) 03/17/2015  . Vitamin D deficiency 01/28/2017    Past Surgical History:  Procedure Laterality Date  . bilateral tubal ligation    . CARDIAC CATHETERIZATION N/A 03/18/2015   Procedure: LEFT HEART CATH AND CORONARY ANGIOGRAPHY;  Surgeon: Lorretta Harp, MD;  Location: [facility] CATH LAB;  Service: Cardiovascular;  Laterality: N/A;  . CARDIAC SURGERY    . HIP ARTHROPLASTY Right 11/10/2016   Procedure: ARTHROPLASTY BIPOLAR HIP (HEMIARTHROPLASTY);  Surgeon: Marchia Bond, MD;  Location: [facility];  Service: Orthopedics;  Laterality: Right;  . 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 LITHOTRIPSY      Allergies as of 08/08/2018      Reactions   Lisinopril Other (See Comments)   Reaction:  Respiratory arrest    Spironolactone Anaphylaxis   Vancomycin Anaphylaxis, Rash   Donepezil Diarrhea   Eggs Or Egg-derived Products Diarrhea   Erythromycin Other (See Comments)   Reaction:  Makes pt feel weird    Macrolides And Ketolides Other (See Comments)  Reaction:  Unknown    Penicillins Itching, Other (See Comments)   Has patient had a PCN reaction causing immediate rash, facial/tongue/throat swelling, SOB or lightheadedness with hypotension: No Has patient had a PCN reaction causing severe rash involving mucus membranes or skin necrosis: No Has patient had a PCN reaction that required  hospitalization No Has patient had a PCN reaction occurring within the last 10 years: No If all of the above answers are "NO", then may proceed with Cephalosporin use.   Quinapril Hcl Itching   Angiotensin Receptor Blockers Other (See Comments)   Reaction:  Unknown    Lipitor [atorvastatin] Palpitations   Pentazocine Lactate Other (See Comments)   Reaction:  Unknown    Propoxyphene Hcl Other (See Comments)   Reaction unknown   Sulfonamide Derivatives Other (See Comments)   Reaction:  Unknown       Medication List        Accurate as of 08/08/18 11:59 PM. Always use your most recent med list.          acetaminophen 325 MG tablet Commonly known as:  TYLENOL Take 650 mg by mouth every 6 (six) hours as needed.   albuterol 108 (90 Base) MCG/ACT inhaler Commonly known as:  PROVENTIL HFA;VENTOLIN HFA Inhale 2 puffs into the lungs every 6 (six) hours as needed for wheezing or shortness of breath.   aspirin EC 81 MG tablet Take 81 mg by mouth daily.   atorvastatin 40 MG tablet Commonly known as:  LIPITOR Take 40 mg by mouth daily.   clopidogrel 75 MG tablet Commonly known as:  PLAVIX Take 75 mg by mouth daily.   furosemide 20 MG tablet Commonly known as:  LASIX Take 20 mg by mouth daily.   HUMALOG KWIKPEN 100 UNIT/ML KiwkPen Generic drug:  insulin lispro Inject 4 Units into the skin 3 (three) times daily with meals.   insulin glargine 100 UNIT/ML injection Commonly known as:  LANTUS Inject 15 Units into the skin at bedtime. If blood sugar is > 150   isosorbide mononitrate 30 MG 24 hr tablet Commonly known as:  IMDUR Take 0.5 tablets (15 mg total) by mouth daily.   memantine 10 MG tablet Commonly known as:  NAMENDA Take 10 mg by mouth 2 (two) times daily.   metFORMIN 1000 MG tablet Commonly known as:  GLUCOPHAGE Take 1,000 mg by mouth 2 (two) times daily with a meal.   metoprolol succinate 25 MG 24 hr tablet Commonly known as:  TOPROL-XL Take 0.5 tablets (12.5  mg total) by mouth daily.   mirtazapine 15 MG disintegrating tablet Commonly known as:  REMERON SOL-TAB Take 1 tablet (15 mg total) by mouth at bedtime.   mometasone-formoterol 100-5 MCG/ACT Aero Commonly known as:  DULERA Inhale 2 puffs into the lungs 2 (two) times daily.   nitroGLYCERIN 0.4 MG SL tablet Commonly known as:  NITROSTAT Place 0.4 mg under the tongue every 5 (five) minutes as needed for chest pain.   pantoprazole 40 MG tablet Commonly known as:  PROTONIX Take 40 mg by mouth daily.   polyethylene glycol packet Commonly known as:  MIRALAX / GLYCOLAX Take 17 g by mouth daily as needed for mild constipation.   sennosides-docusate sodium 8.6-50 MG tablet Commonly known as:  SENOKOT-S Take 2 tablets by mouth daily.   traMADol 50 MG tablet Commonly known as:  ULTRAM Take 50 mg by mouth 2 (two) times daily. Back pain   traZODone 50 MG tablet Commonly known as:  DESYREL  Take 25 mg by mouth at bedtime.       No orders of the defined types were placed in this encounter.   Immunization History  Administered Date(s) Administered  . Influenza Split 08/20/2014  . Influenza, High Dose Seasonal PF 10/06/2013  . Influenza-Unspecified 10/17/2017  . PPD Test 11/16/2016, 12/05/2016  . Pneumococcal Conjugate-13 10/17/2017  . Pneumococcal Polysaccharide-23 09/24/2009  . Tdap 10/18/2017    Social History   Tobacco Use  . Smoking status: Former Smoker    Packs/day: 1.00    Years: 25.00    Pack years: 25.00    Types: Cigarettes  . Smokeless tobacco: Never Used  . Tobacco comment: over a ppd for 40+ years; quit 10/2004.  unsuccessfully tried eCigs.  Substance Use Topics  . Alcohol use: No    Alcohol/week: 1.0 standard drinks    Types: 1 Standard drinks or equivalent per week    Review of Systems  DATA OBTAINED: from patient-limited; nursing- no acute concerns except poor appetite GENERAL:  no fevers, fatigue, poor appetite SKIN: No itching, rash HEENT: No  complaint RESPIRATORY: No cough, wheezing, SOB CARDIAC: No chest pain, palpitations, lower extremity edema  GI: No abdominal pain, No N/V/D or constipation, No heartburn or reflux  GU: No dysuria, frequency or urgency, or incontinence  MUSCULOSKELETAL: No unrelieved bone/joint pain NEUROLOGIC: No headache, dizziness  PSYCHIATRIC: No overt anxiety or sadness  Vitals:   08/08/18 1527  BP: 120/63  Pulse: 73  Resp: 18  Temp: 98.3 F (36.8 C)   Body mass index is 19.29 kg/m. Physical Exam  GENERAL APPEARANCE: Alert, minimally conversant, No acute distress  SKIN: No diaphoresis rash HEENT: Unremarkable RESPIRATORY: Breathing is even, unlabored. Lung sounds are clear   CARDIOVASCULAR: Heart RRR no murmurs, rubs or gallops. No peripheral edema  GASTROINTESTINAL: Abdomen is soft, non-tender, not distended w/ normal bowel sounds.  GENITOURINARY: Bladder non tender, not distended  MUSCULOSKELETAL: No abnormal joints or musculature NEUROLOGIC: Cranial nerves 2-12 grossly intact. Moves all extremities PSYCHIATRIC: Mood and affect and with dementia, no behavioral issues  Patient Active Problem List   Diagnosis Date Noted  . Insomnia 08/06/2018  . GERD (gastroesophageal reflux disease) 08/04/2017  . Dysphagia 05/10/2017  . Depression 03/14/2017  . Vitamin D deficiency 01/28/2017  . UTI due to extended-spectrum beta lactamase (ESBL) producing Escherichia coli 11/18/2016  . Urinary tract infection due to Proteus 11/18/2016  . Postoperative anemia due to acute blood loss 11/18/2016  . HTN (hypertension) 11/18/2016  . Chronic diastolic CHF (congestive heart failure) ([facility]) 11/12/2016  . Closed right hip fracture ([place]) 11/10/2016  . 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 Obesity (BMI 30-39.9) 10/06/2015  . Tobacco use disorder 03/17/2015  . Cardiomyopathy, ischemic 03/17/2015  . Allergy to ACE inhibitors 03/17/2015  . Ventricular tachycardia ([place]) 03/17/2015  . HLD  (hyperlipidemia) 12/31/2014  . COPD (chronic obstructive pulmonary disease) ([place]) 12/31/2014  . Chronic cough 09/12/2013  . Memory deficit 04/23/2013  . Essential hypertension, benign 04/03/2013  . Dyslipidemia 04/03/2013  . Coronary atherosclerosis of native coronary artery 04/03/2013  . DM2 (diabetes mellitus, type 2) ([place]) 04/02/2013  . Fibromyalgia 04/20/2008    CMP     Component Value Date/Time   NA 148 (A) 04/18/2018   K 3.2 (A) 04/18/2018   CL 103 11/16/2016 0522   CO2 31 11/16/2016 0522   GLUCOSE 220 (H) 11/16/2016 0522   BUN 14 04/18/2018   CREATININE 0.7 04/18/2018   CREATININE 0.77 11/16/2016 0522   CALCIUM 8.2 (  L) 11/16/2016 0522   PROT 7.7 04/27/2016 1724   ALBUMIN 4.4 04/27/2016 1724   AST 14 04/18/2018   ALT 7 04/18/2018   ALKPHOS 87 04/18/2018   BILITOT 1.2 04/27/2016 1724   GFRNONAA >60 11/16/2016 0522   GFRAA >60 11/16/2016 0522   Recent Labs    10/23/17 04/18/18  NA  --  148*  K  --  3.2*  BUN 19 14  CREATININE 0.6 0.7   Recent Labs    10/23/17 04/18/18  AST 15 14  ALT 5* 7  ALKPHOS 107 87   Recent Labs    10/23/17 04/18/18  WBC 8.0 12.9  HGB 13.4 14.8  HCT 40 44  PLT 198 175   Recent Labs    10/23/17  CHOL 137  LDLCALC 78  TRIG 132   Lab Results  Component Value Date   MICROALBUR 1.9 06/22/2017   Lab Results  Component Value Date   TSH 3.25 04/18/2018   Lab Results  Component Value Date   HGBA1C 6.1 04/18/2018   Lab Results  Component Value Date   CHOL 137 10/23/2017   HDL 33 (A) 10/23/2017   LDLCALC 78 10/23/2017   TRIG 132 10/23/2017   CHOLHDL 2 12/19/2012    Significant Diagnostic Results in last 30 days:  No results found.  Assessment and Plan  Chronic diastolic CHF (congestive heart failure) (HCC) Stable; continue Toprol-XL 12.5 mg daily Lasix 40 mg daily and indoor 15 mg daily  Coronary atherosclerosis of native coronary artery No complaints of chest pain; continue Toprol-XL 12.5 mg daily, M. Doerr 15 mg  daily and aspirin 81 mg daily; patient is on statin  Essential hypertension, benign Controlled; continue Toprol-XL 12.5 mg daily and Lasix 40 mg daily     Mamta Rimmer D. [name], MD

## 2018-08-16 ENCOUNTER — Encounter: Payer: Self-pay | Admitting: Internal Medicine

## 2018-08-16 NOTE — Assessment & Plan Note (Signed)
No complaints of chest pain; continue Toprol-XL 12.5 mg daily, M. Doerr 15 mg daily and aspirin 81 mg daily; patient is on statin

## 2018-08-16 NOTE — Assessment & Plan Note (Signed)
Stable; continue Toprol-XL 12.5 mg daily Lasix 40 mg daily and indoor 15 mg daily

## 2018-08-16 NOTE — Assessment & Plan Note (Signed)
Controlled; continue Toprol-XL 12.5 mg daily and Lasix 40 mg daily

## 2018-08-20 DIAGNOSIS — F331 Major depressive disorder, recurrent, moderate: Secondary | ICD-10-CM | POA: Diagnosis not present

## 2018-08-20 DIAGNOSIS — F419 Anxiety disorder, unspecified: Secondary | ICD-10-CM | POA: Diagnosis not present

## 2018-08-20 DIAGNOSIS — F0390 Unspecified dementia without behavioral disturbance: Secondary | ICD-10-CM | POA: Diagnosis not present

## 2018-08-20 DIAGNOSIS — G4700 Insomnia, unspecified: Secondary | ICD-10-CM | POA: Diagnosis not present

## 2018-09-06 ENCOUNTER — Encounter: Payer: Self-pay | Admitting: Internal Medicine

## 2018-09-06 ENCOUNTER — Non-Acute Institutional Stay (SKILLED_NURSING_FACILITY): Payer: Medicare Other | Admitting: Internal Medicine

## 2018-09-06 DIAGNOSIS — K219 Gastro-esophageal reflux disease without esophagitis: Secondary | ICD-10-CM | POA: Diagnosis not present

## 2018-09-06 DIAGNOSIS — F329 Major depressive disorder, single episode, unspecified: Secondary | ICD-10-CM

## 2018-09-06 DIAGNOSIS — R1312 Dysphagia, oropharyngeal phase: Secondary | ICD-10-CM | POA: Diagnosis not present

## 2018-09-06 DIAGNOSIS — F32A Depression, unspecified: Secondary | ICD-10-CM

## 2018-09-06 NOTE — Progress Notes (Signed)
Location:  [facility] Room Number: 202D Place of Service:  SNF [phone])  [name]. Kathleen Coil, MD  Patient Care Team: Kathleen Duos, MD as PCP - General (Internal Medicine)  Extended Emergency Contact Information Primary Emergency Contact: [name] Address: [address]          [place], [place] [address] [name] of [place] Phone: [phone] Work Phone: [phone] Mobile Phone: [phone] Relation: Daughter Secondary Emergency Contact: [name]  [place] Phone: [phone] Relation: Niece    Allergies: Lisinopril; Spironolactone; Vancomycin; Donepezil; Eggs or egg-derived products; Erythromycin; Macrolides and ketolides; Penicillins; Quinapril hcl; Angiotensin receptor blockers; Lipitor [atorvastatin]; Pentazocine lactate; Propoxyphene hcl; and Sulfonamide derivatives  Chief Complaint  Patient presents with  . Medical Management of Chronic Issues    Routine Visit    HPI: Patient is 77 y.o. female who is being seen for routine issues of GERD, dysphasia, and depression.  Past Medical History:  Diagnosis Date  . Allergy to ACE inhibitors 03/17/2015  . Anxiety   . CAD (coronary artery disease)    a. s/p inferior STEMI 12/29/10 with rotablator atherectomy RCA 12/31/10 and DES x 2 RCA;  b. Lexiscan Myoview (02/2015): mild reversible apical anterior perfusion defect. C. cath 02/2015 50% LAD lesion with patent stent, Tx Rx    . Cardiomyopathy, ischemic 03/17/2015  . Chronic diastolic CHF (congestive heart failure) ([place]) 11/12/2016  . COPD (chronic obstructive pulmonary disease) ([facility])   . Coronary atherosclerosis of native coronary artery 04/03/2013  . Dementia ([place])   . Depression 03/14/2017  . Diabetes mellitus    type 2  . Diverticulosis   . DM2 (diabetes mellitus, type 2) ([place]) 04/02/2013  . Dysphagia 05/10/2017   Minimal 6/20 - pureed diet  . Fibromyalgia 04/20/2008   Qualifier: Diagnosis of  By:  Nelson-Smith CMA (AAMA), Dottie    . GERD (gastroesophageal reflux disease)   . Glaucoma   . HTN (hypertension)   . Hyperlipidemia   . Ischemic cardiomyopathy    EF 45%cath 2012 EF55% 5/12; 35% 11/14  . Memory deficit   . Nephrolithiasis   . Obesity (BMI 30-39.9) 10/06/2015  . Osteoarthritis   . Overweight(278.02)   . Respiratory arrest//ACE Inhibitor presumed cause   . Senile dementia without behavioral disturbance ([place]) 10/06/2015  . Sleep apnea   . Small bowel obstruction (HCC)    from a sigmoid stricture  . Ventricular tachycardia ([place]) 03/17/2015  . Vitamin D deficiency 01/28/2017    Past Surgical History:  Procedure Laterality Date  . bilateral tubal ligation    . CARDIAC CATHETERIZATION N/A 03/18/2015   Procedure: LEFT HEART CATH AND CORONARY ANGIOGRAPHY;  Surgeon: Lorretta Harp, MD;  Location: [facility] CATH LAB;  Service: Cardiovascular;  Laterality: N/A;  . CARDIAC SURGERY    . HIP ARTHROPLASTY Right 11/10/2016   Procedure: ARTHROPLASTY BIPOLAR HIP (HEMIARTHROPLASTY);  Surgeon: Marchia Bond, MD;  Location: [facility];  Service: Orthopedics;  Laterality: Right;  . 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 LITHOTRIPSY      Allergies as of 09/06/2018      Reactions   Lisinopril Other (See Comments)   Reaction:  Respiratory arrest    Spironolactone Anaphylaxis   Vancomycin Anaphylaxis, Rash   Donepezil Diarrhea   Eggs Or Egg-derived Products Diarrhea   Erythromycin Other (See Comments)   Reaction:  Makes pt feel weird    Macrolides And Ketolides Other (See Comments)   Reaction:  Unknown  Penicillins Itching, Other (See Comments)   Has patient had a PCN reaction causing immediate rash, facial/tongue/throat swelling, SOB or lightheadedness with hypotension: No Has patient had a PCN reaction causing severe rash involving mucus membranes or skin necrosis: No Has patient had a PCN reaction that required hospitalization No Has patient had a PCN  reaction occurring within the last 10 years: No If all of the above answers are "NO", then may proceed with Cephalosporin use.   Quinapril Hcl Itching   Angiotensin Receptor Blockers Other (See Comments)   Reaction:  Unknown    Lipitor [atorvastatin] Palpitations   Pentazocine Lactate Other (See Comments)   Reaction:  Unknown    Propoxyphene Hcl Other (See Comments)   Reaction unknown   Sulfonamide Derivatives Other (See Comments)   Reaction:  Unknown       Medication List        Accurate as of 09/06/18 11:59 PM. Always use your most recent med list.          acetaminophen 325 MG tablet Commonly known as:  TYLENOL Take 650 mg by mouth every 6 (six) hours as needed.   albuterol 108 (90 Base) MCG/ACT inhaler Commonly known as:  PROVENTIL HFA;VENTOLIN HFA Inhale 2 puffs into the lungs every 6 (six) hours as needed for wheezing or shortness of breath.   aspirin EC 81 MG tablet Take 81 mg by mouth daily.   atorvastatin 40 MG tablet Commonly known as:  LIPITOR Take 40 mg by mouth daily.   clopidogrel 75 MG tablet Commonly known as:  PLAVIX Take 75 mg by mouth daily.   furosemide 20 MG tablet Commonly known as:  LASIX Take 20 mg by mouth daily.   HUMALOG KWIKPEN 100 UNIT/ML KiwkPen Generic drug:  insulin lispro Inject 4 Units into the skin 3 (three) times daily with meals.   insulin glargine 100 UNIT/ML injection Commonly known as:  LANTUS Inject 15 Units into the skin at bedtime. If blood sugar is > 150   isosorbide mononitrate 30 MG 24 hr tablet Commonly known as:  IMDUR Take 0.5 tablets (15 mg total) by mouth daily.   memantine 10 MG tablet Commonly known as:  NAMENDA Take 10 mg by mouth 2 (two) times daily.   metFORMIN 1000 MG tablet Commonly known as:  GLUCOPHAGE Take 1,000 mg by mouth 2 (two) times daily with a meal.   metoprolol succinate 25 MG 24 hr tablet Commonly known as:  TOPROL-XL Take 0.5 tablets (12.5 mg total) by mouth daily.   mirtazapine  15 MG disintegrating tablet Commonly known as:  REMERON SOL-TAB Take 1 tablet (15 mg total) by mouth at bedtime.   mometasone-formoterol 100-5 MCG/ACT Aero Commonly known as:  DULERA Inhale 2 puffs into the lungs 2 (two) times daily.   nitroGLYCERIN 0.4 MG SL tablet Commonly known as:  NITROSTAT Place 0.4 mg under the tongue every 5 (five) minutes as needed for chest pain.   pantoprazole 40 MG tablet Commonly known as:  PROTONIX Take 40 mg by mouth daily.   polyethylene glycol packet Commonly known as:  MIRALAX / GLYCOLAX Take 17 g by mouth daily as needed for mild constipation.   sennosides-docusate sodium 8.6-50 MG tablet Commonly known as:  SENOKOT-S Take 2 tablets by mouth daily.   traMADol 50 MG tablet Commonly known as:  ULTRAM Take 50 mg by mouth 2 (two) times daily. Back pain   traZODone 50 MG tablet Commonly known as:  DESYREL Take 25 mg by mouth at  bedtime.   Vitamin D (Ergocalciferol) 50000 units Caps capsule Commonly known as:  DRISDOL Take 50,000 Units by mouth every 7 (seven) days.       No orders of the defined types were placed in this encounter.   Immunization History  Administered Date(s) Administered  . Influenza Split 08/20/2014  . Influenza, High Dose Seasonal PF 10/06/2013  . Influenza-Unspecified 10/17/2017  . PPD Test 11/16/2016, 12/05/2016  . Pneumococcal Conjugate-13 10/17/2017  . Pneumococcal Polysaccharide-23 09/24/2009  . Tdap 10/18/2017    Social History   Tobacco Use  . Smoking status: Former Smoker    Packs/day: 1.00    Years: 25.00    Pack years: 25.00    Types: Cigarettes  . Smokeless tobacco: Never Used  . Tobacco comment: over a ppd for 40+ years; quit 10/2004.  unsuccessfully tried eCigs.  Substance Use Topics  . Alcohol use: No    Alcohol/week: 1.0 standard drinks    Types: 1 Standard drinks or equivalent per week    Review of Systems  DATA OBTAINED: from patient-limited; nursing- no acute concerns GENERAL:   no fevers, fatigue, appetite changes SKIN: No itching, rash HEENT: No complaint RESPIRATORY: No cough, wheezing, SOB CARDIAC: No chest pain, palpitations, lower extremity edema  GI: No abdominal pain, No N/V/D or constipation, No heartburn or reflux  GU: No dysuria, frequency or urgency, or incontinence  MUSCULOSKELETAL: No unrelieved bone/joint pain NEUROLOGIC: No headache, dizziness  PSYCHIATRIC: No overt anxiety or sadness  Vitals:   09/06/18 1157  BP: 130/84  Pulse: 92  Resp: 18  Temp: 98.3 F (36.8 C)   Body mass index is 20.05 kg/m. Physical Exam  GENERAL APPEARANCE: Alert, 80 conversant, No acute distress: Sits in wheelchair nursing station daily SKIN: No diaphoresis rash HEENT: Unremarkable RESPIRATORY: Breathing is even, unlabored. Lung sounds are clear   CARDIOVASCULAR: Heart RRR no murmurs, rubs or gallops. No peripheral edema  GASTROINTESTINAL: Abdomen is soft, non-tender, not distended w/ normal bowel sounds.  GENITOURINARY: Bladder non tender, not distended  MUSCULOSKELETAL: No abnormal joints or musculature NEUROLOGIC: Cranial nerves 2-12 grossly intact. Moves all extremities PSYCHIATRIC: Mood and affect flat, no behavioral issues  Patient Active Problem List   Diagnosis Date Noted  . Insomnia 08/06/2018  . GERD (gastroesophageal reflux disease) 08/04/2017  . Dysphagia 05/10/2017  . Depression 03/14/2017  . Vitamin D deficiency 01/28/2017  . UTI due to extended-spectrum beta lactamase (ESBL) producing Escherichia coli 11/18/2016  . Urinary tract infection due to Proteus 11/18/2016  . Postoperative anemia due to acute blood loss 11/18/2016  . HTN (hypertension) 11/18/2016  . Chronic diastolic CHF (congestive heart failure) ([place]) 11/12/2016  . Closed right hip fracture ([place]) 11/10/2016  . Senile dementia without behavioral disturbance ([place]) 10/06/2015  . Obesity (BMI 30-39.9) 10/06/2015  . Tobacco use disorder 03/17/2015  . Cardiomyopathy, ischemic  03/17/2015  . Allergy to ACE inhibitors 03/17/2015  . Ventricular tachycardia ([place]) 03/17/2015  . HLD (hyperlipidemia) 12/31/2014  . COPD (chronic obstructive pulmonary disease) ([place]) 12/31/2014  . Chronic cough 09/12/2013  . Memory deficit 04/23/2013  . Essential hypertension, benign 04/03/2013  . Dyslipidemia 04/03/2013  . Coronary atherosclerosis of native coronary artery 04/03/2013  . DM2 (diabetes mellitus, type 2) ([place]) 04/02/2013  . Fibromyalgia 04/20/2008    CMP     Component Value Date/Time   NA 148 (A) 04/18/2018   K 3.2 (A) 04/18/2018   CL 103 11/16/2016 0522   CO2 31 11/16/2016 0522   GLUCOSE 220 (H) 11/16/2016 0522  BUN 14 04/18/2018   CREATININE 0.7 04/18/2018   CREATININE 0.77 11/16/2016 0522   CALCIUM 8.2 (L) 11/16/2016 0522   PROT 7.7 04/27/2016 1724   ALBUMIN 4.4 04/27/2016 1724   AST 14 04/18/2018   ALT 7 04/18/2018   ALKPHOS 87 04/18/2018   BILITOT 1.2 04/27/2016 1724   GFRNONAA >60 11/16/2016 0522   GFRAA >60 11/16/2016 0522   Recent Labs    10/23/17 04/18/18  NA  --  148*  K  --  3.2*  BUN 19 14  CREATININE 0.6 0.7   Recent Labs    10/23/17 04/18/18  AST 15 14  ALT 5* 7  ALKPHOS 107 87   Recent Labs    10/23/17 04/18/18  WBC 8.0 12.9  HGB 13.4 14.8  HCT 40 44  PLT 198 175   Recent Labs    10/23/17  CHOL 137  LDLCALC 78  TRIG 132   Lab Results  Component Value Date   MICROALBUR 1.9 06/22/2017   Lab Results  Component Value Date   TSH 3.25 04/18/2018   Lab Results  Component Value Date   HGBA1C 6.1 04/18/2018   Lab Results  Component Value Date   CHOL 137 10/23/2017   HDL 33 (A) 10/23/2017   LDLCALC 78 10/23/2017   TRIG 132 10/23/2017   CHOLHDL 2 12/19/2012    Significant Diagnostic Results in last 30 days:  No results found.  Assessment and Plan  GERD (gastroesophageal reflux disease) No reported problems; continue Protonix 40 mg daily  Dysphagia Stable, chronic; continue pured  diet  Depression Appears to be mildly better; continue Remeron 15 mg nightly and trazodone 25 mg nightly      Kathleen Friedt D. Kathleen Coil, MD

## 2018-09-19 DIAGNOSIS — M7981 Nontraumatic hematoma of soft tissue: Secondary | ICD-10-CM | POA: Diagnosis not present

## 2018-09-19 DIAGNOSIS — R600 Localized edema: Secondary | ICD-10-CM | POA: Diagnosis not present

## 2018-09-21 ENCOUNTER — Encounter: Payer: Self-pay | Admitting: Internal Medicine

## 2018-09-21 NOTE — Assessment & Plan Note (Signed)
No reported problems; continue Protonix 40 mg daily 

## 2018-09-21 NOTE — Assessment & Plan Note (Signed)
Appears to be mildly better; continue Remeron 15 mg nightly and trazodone 25 mg nightly

## 2018-09-21 NOTE — Assessment & Plan Note (Signed)
Stable, chronic; continue pured diet

## 2018-09-27 DIAGNOSIS — F0390 Unspecified dementia without behavioral disturbance: Secondary | ICD-10-CM | POA: Diagnosis not present

## 2018-09-27 DIAGNOSIS — F331 Major depressive disorder, recurrent, moderate: Secondary | ICD-10-CM | POA: Diagnosis not present

## 2018-09-27 DIAGNOSIS — F419 Anxiety disorder, unspecified: Secondary | ICD-10-CM | POA: Diagnosis not present

## 2018-10-09 ENCOUNTER — Non-Acute Institutional Stay (SKILLED_NURSING_FACILITY): Payer: Medicare Other | Admitting: Internal Medicine

## 2018-10-09 ENCOUNTER — Encounter: Payer: Self-pay | Admitting: Internal Medicine

## 2018-10-09 DIAGNOSIS — J449 Chronic obstructive pulmonary disease, unspecified: Secondary | ICD-10-CM | POA: Diagnosis not present

## 2018-10-09 DIAGNOSIS — I10 Essential (primary) hypertension: Secondary | ICD-10-CM | POA: Diagnosis not present

## 2018-10-09 DIAGNOSIS — E559 Vitamin D deficiency, unspecified: Secondary | ICD-10-CM | POA: Diagnosis not present

## 2018-10-09 NOTE — Progress Notes (Signed)
Location:  [facility] Room Number: 202D Place of Service:  SNF [phone])  [name]. Kathleen Coil, MD  Patient Care Team: Hennie Duos, MD as PCP - General (Internal Medicine)  Extended Emergency Contact Information Primary Emergency Contact: [name],[name] Address: [address]          [place], [place] [address] [name] of [place] Phone: [phone] Work Phone: [phone] Mobile Phone: [phone] Relation: Daughter Secondary Emergency Contact: [name],[name]  [place] Phone: [phone] Relation: Niece    Allergies: Lisinopril; Spironolactone; Vancomycin; Donepezil; Eggs or egg-derived products; Erythromycin; Macrolides and ketolides; Penicillins; Quinapril hcl; Angiotensin receptor blockers; Lipitor [atorvastatin]; Pentazocine lactate; Propoxyphene hcl; and Sulfonamide derivatives  Chief Complaint  Patient presents with  . Medical Management of Chronic Issues    Routine Visit    HPI: Patient is 77 y.o. female who is being seen for routine issues of vitamin D deficiency, COPD, and hypertension.  Past Medical History:  Diagnosis Date  . Allergy to ACE inhibitors 03/17/2015  . Anxiety   . CAD (coronary artery disease)    a. s/p inferior STEMI 12/29/10 with rotablator atherectomy RCA 12/31/10 and DES x 2 RCA;  b. Lexiscan Myoview (02/2015): mild reversible apical anterior perfusion defect. C. cath 02/2015 50% LAD lesion with patent stent, Tx Rx    . Cardiomyopathy, ischemic 03/17/2015  . Chronic diastolic CHF (congestive heart failure) ([place]) 11/12/2016  . COPD (chronic obstructive pulmonary disease) ([place])   . Coronary atherosclerosis of native coronary artery 04/03/2013  . Dementia ([place])   . Depression 03/14/2017  . Diabetes mellitus    type 2  . Diverticulosis   . DM2 (diabetes mellitus, type 2) ([place]) 04/02/2013  . Dysphagia 05/10/2017   Minimal 6/20 - pureed diet  . Fibromyalgia 04/20/2008   Qualifier: Diagnosis of   By: Nelson-Smith CMA (AAMA), Dottie    . GERD (gastroesophageal reflux disease)   . Glaucoma   . HTN (hypertension)   . Hyperlipidemia   . Ischemic cardiomyopathy    EF 45%cath 2012 EF55% 5/12; 35% 11/14  . Memory deficit   . Nephrolithiasis   . Obesity (BMI 30-39.9) 10/06/2015  . Osteoarthritis   . Overweight(278.02)   . Respiratory arrest//ACE Inhibitor presumed cause   . Senile dementia without behavioral disturbance ([place]) 10/06/2015  . Sleep apnea   . Small bowel obstruction (HCC)    from a sigmoid stricture  . Ventricular tachycardia ([place]) 03/17/2015  . Vitamin D deficiency 01/28/2017    Past Surgical History:  Procedure Laterality Date  . bilateral tubal ligation    . CARDIAC CATHETERIZATION N/A 03/18/2015   Procedure: LEFT HEART CATH AND CORONARY ANGIOGRAPHY;  Surgeon: Lorretta Harp, MD;  Location: [facility] CATH LAB;  Service: Cardiovascular;  Laterality: N/A;  . CARDIAC SURGERY    . HIP ARTHROPLASTY Right 11/10/2016   Procedure: ARTHROPLASTY BIPOLAR HIP (HEMIARTHROPLASTY);  Surgeon: Marchia Bond, MD;  Location: [facility];  Service: Orthopedics;  Laterality: Right;  . 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 LITHOTRIPSY      Allergies as of 10/09/2018      Reactions   Lisinopril Other (See Comments)   Reaction:  Respiratory arrest    Spironolactone Anaphylaxis   Vancomycin Anaphylaxis, Rash   Donepezil Diarrhea   Eggs Or Egg-derived Products Diarrhea   Erythromycin Other (See Comments)   Reaction:  Makes pt feel weird    Macrolides And Ketolides Other (See Comments)   Reaction:  Unknown    Penicillins Itching, Other (See Comments)   Has patient had a PCN reaction causing immediate rash, facial/tongue/throat swelling, SOB or lightheadedness with hypotension: No Has patient had a PCN reaction causing severe rash involving mucus membranes or skin necrosis: No Has patient had a PCN reaction that required hospitalization No Has patient had a PCN  reaction occurring within the last 10 years: No If all of the above answers are "NO", then may proceed with Cephalosporin use.   Quinapril Hcl Itching   Angiotensin Receptor Blockers Other (See Comments)   Reaction:  Unknown    Lipitor [atorvastatin] Palpitations   Pentazocine Lactate Other (See Comments)   Reaction:  Unknown    Propoxyphene Hcl Other (See Comments)   Reaction unknown   Sulfonamide Derivatives Other (See Comments)   Reaction:  Unknown       Medication List        Accurate as of 10/09/18 11:59 PM. Always use your most recent med list.          acetaminophen 325 MG tablet Commonly known as:  TYLENOL Take 650 mg by mouth every 6 (six) hours as needed.   albuterol 108 (90 Base) MCG/ACT inhaler Commonly known as:  PROVENTIL HFA;VENTOLIN HFA Inhale 2 puffs into the lungs every 6 (six) hours as needed for wheezing or shortness of breath.   aspirin EC 81 MG tablet Take 81 mg by mouth daily.   atorvastatin 40 MG tablet Commonly known as:  LIPITOR Take 40 mg by mouth daily.   clopidogrel 75 MG tablet Commonly known as:  PLAVIX Take 75 mg by mouth daily.   furosemide 20 MG tablet Commonly known as:  LASIX Take 20 mg by mouth daily.   HUMALOG KWIKPEN 100 UNIT/ML KiwkPen Generic drug:  insulin lispro Inject 4 Units into the skin 3 (three) times daily with meals.   insulin glargine 100 UNIT/ML injection Commonly known as:  LANTUS Inject 15 Units into the skin at bedtime. If blood sugar is > 150   isosorbide mononitrate 30 MG 24 hr tablet Commonly known as:  IMDUR Take 0.5 tablets (15 mg total) by mouth daily.   memantine 10 MG tablet Commonly known as:  NAMENDA Take 10 mg by mouth 2 (two) times daily.   metFORMIN 1000 MG tablet Commonly known as:  GLUCOPHAGE Take 1,000 mg by mouth 2 (two) times daily with a meal.   metoprolol succinate 25 MG 24 hr tablet Commonly known as:  TOPROL-XL Take 0.5 tablets (12.5 mg total) by mouth daily.   mirtazapine  15 MG disintegrating tablet Commonly known as:  REMERON SOL-TAB Take 1 tablet (15 mg total) by mouth at bedtime.   mometasone-formoterol 100-5 MCG/ACT Aero Commonly known as:  DULERA Inhale 2 puffs into the lungs 2 (two) times daily.   nitroGLYCERIN 0.4 MG SL tablet Commonly known as:  NITROSTAT Place 0.4 mg under the tongue every 5 (five) minutes as needed for chest pain.   pantoprazole 40 MG tablet Commonly known as:  PROTONIX Take 40 mg by mouth daily.   polyethylene glycol packet Commonly known as:  MIRALAX / GLYCOLAX Take 17 g by mouth daily as needed for mild constipation.   sennosides-docusate sodium 8.6-50 MG tablet Commonly known as:  SENOKOT-S Take 2 tablets by mouth daily.   traMADol 50 MG tablet Commonly known as:  ULTRAM Take 50 mg by mouth 2 (two) times daily. Back pain   traZODone 50 MG tablet Commonly known as:  DESYREL Take 25  mg by mouth at bedtime.   Vitamin D (Ergocalciferol) 1.25 MG (50000 UT) Caps capsule Commonly known as:  DRISDOL Take 50,000 Units by mouth every 7 (seven) days.       No orders of the defined types were placed in this encounter.   Immunization History  Administered Date(s) Administered  . Influenza Split 08/20/2014  . Influenza, High Dose Seasonal PF 10/06/2013  . Influenza-Unspecified 10/17/2017  . PPD Test 11/16/2016, 12/05/2016  . Pneumococcal Conjugate-13 10/17/2017  . Pneumococcal Polysaccharide-23 09/24/2009  . Tdap 10/18/2017    Social History   Tobacco Use  . Smoking status: Former Smoker    Packs/day: 1.00    Years: 25.00    Pack years: 25.00    Types: Cigarettes  . Smokeless tobacco: Never Used  . Tobacco comment: over a ppd for 40+ years; quit 10/2004.  unsuccessfully tried eCigs.  Substance Use Topics  . Alcohol use: No    Alcohol/week: 1.0 standard drinks    Types: 1 Standard drinks or equivalent per week    Review of Systems  DATA OBTAINED: from patient, nurse GENERAL:  no fevers, fatigue,  appetite changes SKIN: No itching, rash HEENT: No complaint RESPIRATORY: No cough, wheezing, SOB CARDIAC: No chest pain, palpitations, lower extremity edema  GI: No abdominal pain, No N/V/D or constipation, No heartburn or reflux  GU: No dysuria, frequency or urgency, or incontinence  MUSCULOSKELETAL: No unrelieved bone/joint pain NEUROLOGIC: No headache, dizziness  PSYCHIATRIC: No overt anxiety or sadness  Vitals:   10/09/18 1247  BP: 134/70  Pulse: 69  Resp: 16  Temp: 98.2 F (36.8 C)   Body mass index is 20.05 kg/m. Physical Exam  GENERAL APPEARANCE: Alert, moderately conversant, No acute distress  SKIN: No diaphoresis rash HEENT: Unremarkable RESPIRATORY: Breathing is even, unlabored. Lung sounds are clear   CARDIOVASCULAR: Heart RRR no murmurs, rubs or gallops. No peripheral edema  GASTROINTESTINAL: Abdomen is soft, non-tender, not distended w/ normal bowel sounds.  GENITOURINARY: Bladder non tender, not distended  MUSCULOSKELETAL: No abnormal joints or musculature NEUROLOGIC: Cranial nerves 2-12 grossly intact. Moves all extremities PSYCHIATRIC: Mood and affect dementia, no behavioral issues  Patient Active Problem List   Diagnosis Date Noted  . Insomnia 08/06/2018  . GERD (gastroesophageal reflux disease) 08/04/2017  . Dysphagia 05/10/2017  . Depression 03/14/2017  . Vitamin D deficiency 01/28/2017  . UTI due to extended-spectrum beta lactamase (ESBL) producing Escherichia coli 11/18/2016  . Urinary tract infection due to Proteus 11/18/2016  . Postoperative anemia due to acute blood loss 11/18/2016  . HTN (hypertension) 11/18/2016  . Chronic diastolic CHF (congestive heart failure) ([place]) 11/12/2016  . Closed right hip fracture ([place]) 11/10/2016  . Senile dementia without behavioral disturbance ([place]) 10/06/2015  . Obesity (BMI 30-39.9) 10/06/2015  . Tobacco use disorder 03/17/2015  . Cardiomyopathy, ischemic 03/17/2015  . Allergy to ACE inhibitors 03/17/2015    . Ventricular tachycardia ([place]) 03/17/2015  . HLD (hyperlipidemia) 12/31/2014  . COPD (chronic obstructive pulmonary disease) ([place]) 12/31/2014  . Chronic cough 09/12/2013  . Memory deficit 04/23/2013  . Essential hypertension, benign 04/03/2013  . Dyslipidemia 04/03/2013  . Coronary atherosclerosis of native coronary artery 04/03/2013  . DM2 (diabetes mellitus, type 2) ([place]) 04/02/2013  . Fibromyalgia 04/20/2008    CMP     Component Value Date/Time   NA 148 (A) 04/18/2018   K 3.2 (A) 04/18/2018   CL 103 11/16/2016 0522   CO2 31 11/16/2016 0522   GLUCOSE 220 (H) 11/16/2016 0522   BUN  14 04/18/2018   CREATININE 0.7 04/18/2018   CREATININE 0.77 11/16/2016 0522   CALCIUM 8.2 (L) 11/16/2016 0522   PROT 7.7 04/27/2016 1724   ALBUMIN 4.4 04/27/2016 1724   AST 14 04/18/2018   ALT 7 04/18/2018   ALKPHOS 87 04/18/2018   BILITOT 1.2 04/27/2016 1724   GFRNONAA >60 11/16/2016 0522   GFRAA >60 11/16/2016 0522   Recent Labs    10/23/17 04/18/18  NA  --  148*  K  --  3.2*  BUN 19 14  CREATININE 0.6 0.7   Recent Labs    10/23/17 04/18/18  AST 15 14  ALT 5* 7  ALKPHOS 107 87   Recent Labs    10/23/17 04/18/18  WBC 8.0 12.9  HGB 13.4 14.8  HCT 40 44  PLT 198 175   Recent Labs    10/23/17  CHOL 137  LDLCALC 78  TRIG 132   Lab Results  Component Value Date   MICROALBUR 1.9 06/22/2017   Lab Results  Component Value Date   TSH 3.25 04/18/2018   Lab Results  Component Value Date   HGBA1C 6.1 04/18/2018   Lab Results  Component Value Date   CHOL 137 10/23/2017   HDL 33 (A) 10/23/2017   LDLCALC 78 10/23/2017   TRIG 132 10/23/2017   CHOLHDL 2 12/19/2012    Significant Diagnostic Results in last 30 days:  No results found.  Assessment and Plan  Vitamin D deficiency Vitamin D was in the 20s several months ago, patient is still on 50,000 units weekly  COLD (chronic obstructive lung disease) ([place]) No reported exacerbation; continue to [facility] 2 puffs twice  daily along with PRN albuterol  Essential hypertension, benign Controlled; continue Toprol-XL 12.5 mg twice daily, Lasix 20 mg daily and indoor 15 mg daily     Ebany Bowermaster D. Kathleen Coil, MD

## 2018-10-13 ENCOUNTER — Encounter: Payer: Self-pay | Admitting: Internal Medicine

## 2018-10-13 NOTE — Assessment & Plan Note (Signed)
Controlled; continue Toprol-XL 12.5 mg twice daily, Lasix 20 mg daily and indoor 15 mg daily

## 2018-10-13 NOTE — Assessment & Plan Note (Signed)
No reported exacerbation; continue to [facility] 2 puffs twice daily along with PRN albuterol

## 2018-10-13 NOTE — Assessment & Plan Note (Signed)
Vitamin D was in the 20s several months ago, patient is still on 50,000 units weekly

## 2018-10-14 LAB — BASIC METABOLIC PANEL
BUN: 18 (ref 4–21)
CREATININE: 0.7 (ref 0.5–1.1)
GLUCOSE: 161
POTASSIUM: 4.3 (ref 3.4–5.3)
SODIUM: 140 (ref 137–147)

## 2018-10-14 LAB — LIPID PANEL
CHOLESTEROL: 127 (ref 0–200)
HDL: 37 (ref 35–70)
LDL Cholesterol: 69
LDl/HDL Ratio: 3.5
TRIGLYCERIDES: 106 (ref 40–160)

## 2018-10-14 LAB — CBC AND DIFFERENTIAL
HEMATOCRIT: 39 (ref 36–46)
Hemoglobin: 13.2 (ref 12.0–16.0)
PLATELETS: 172 (ref 150–399)
WBC: 9.2

## 2018-10-14 LAB — VITAMIN D 25 HYDROXY (VIT D DEFICIENCY, FRACTURES): Vit D, 25-Hydroxy: 16.78

## 2018-10-14 LAB — HEPATIC FUNCTION PANEL
ALK PHOS: 81 (ref 25–125)
ALT: 12 (ref 7–35)
AST: 12 — AB (ref 13–35)
BILIRUBIN, TOTAL: 0.3

## 2018-10-14 LAB — HEMOGLOBIN A1C: HEMOGLOBIN A1C: 6.8

## 2018-10-14 LAB — TSH: TSH: 6.5 — AB (ref 0.41–5.90)

## 2018-10-21 DIAGNOSIS — E039 Hypothyroidism, unspecified: Secondary | ICD-10-CM | POA: Diagnosis not present

## 2018-10-21 LAB — TSH: TSH: 4.91 (ref 0.41–5.90)

## 2018-11-06 ENCOUNTER — Non-Acute Institutional Stay (SKILLED_NURSING_FACILITY): Payer: Medicare Other | Admitting: Internal Medicine

## 2018-11-06 ENCOUNTER — Encounter: Payer: Self-pay | Admitting: Internal Medicine

## 2018-11-06 DIAGNOSIS — I25118 Atherosclerotic heart disease of native coronary artery with other forms of angina pectoris: Secondary | ICD-10-CM

## 2018-11-06 DIAGNOSIS — J449 Chronic obstructive pulmonary disease, unspecified: Secondary | ICD-10-CM

## 2018-11-06 DIAGNOSIS — I5032 Chronic diastolic (congestive) heart failure: Secondary | ICD-10-CM

## 2018-11-06 NOTE — Progress Notes (Signed)
Location:  [facility] Room Number: 202D Place of Service:  SNF [phone])  Inocencio Homes, MD  Patient Care Team: Hennie Duos, MD as PCP - General (Internal Medicine)  Extended Emergency Contact Information Primary Emergency Contact: [name],[name] Address: [address]          [place], [place] [address] [name] of [place] Phone: [phone] Work Phone: [phone] Mobile Phone: [phone] Relation: Daughter Secondary Emergency Contact: [name],[name]  [place] Phone: [phone] Relation: Niece    Allergies: Lisinopril; Spironolactone; Vancomycin; Donepezil; Eggs or egg-derived products; Erythromycin; Macrolides and ketolides; Penicillins; Quinapril hcl; Angiotensin receptor blockers; Lipitor [atorvastatin]; Pentazocine lactate; Propoxyphene hcl; and Sulfonamide derivatives  Chief Complaint  Patient presents with  . Medical Management of Chronic Issues    Routine Visit    HPI: Patient is 77 y.o. female who is being seen for routine issues of coronary artery disease, chronic diastolic congestive heart failure, and COPD.  Past Medical History:  Diagnosis Date  . Allergy to ACE inhibitors 03/17/2015  . Anxiety   . CAD (coronary artery disease)    a. s/p inferior STEMI 12/29/10 with rotablator atherectomy RCA 12/31/10 and DES x 2 RCA;  b. Lexiscan Myoview (02/2015): mild reversible apical anterior perfusion defect. C. cath 02/2015 50% LAD lesion with patent stent, Tx Rx    . Cardiomyopathy, ischemic 03/17/2015  . Chronic diastolic CHF (congestive heart failure) ([place]) 11/12/2016  . COPD (chronic obstructive pulmonary disease) ([place])   . Coronary atherosclerosis of native coronary artery 04/03/2013  . Dementia ([place])   . Depression 03/14/2017  . Diabetes mellitus    type 2  . Diverticulosis   . DM2 (diabetes mellitus, type 2) ([place]) 04/02/2013  . Dysphagia 05/10/2017   Minimal 6/20 - pureed diet  . Fibromyalgia  04/20/2008   Qualifier: Diagnosis of  By: Nelson-Smith CMA (AAMA), Dottie    . GERD (gastroesophageal reflux disease)   . Glaucoma   . HTN (hypertension)   . Hyperlipidemia   . Ischemic cardiomyopathy    EF 45%cath 2012 EF55% 5/12; 35% 11/14  . Memory deficit   . Nephrolithiasis   . Obesity (BMI 30-39.9) 10/06/2015  . Osteoarthritis   . Overweight(278.02)   . Respiratory arrest//ACE Inhibitor presumed cause   . Senile dementia without behavioral disturbance ([place]) 10/06/2015  . Sleep apnea   . Small bowel obstruction (HCC)    from a sigmoid stricture  . Ventricular tachycardia ([place]) 03/17/2015  . Vitamin D deficiency 01/28/2017    Past Surgical History:  Procedure Laterality Date  . bilateral tubal ligation    . CARDIAC CATHETERIZATION N/A 03/18/2015   Procedure: LEFT HEART CATH AND CORONARY ANGIOGRAPHY;  Surgeon: Lorretta Harp, MD;  Location: [facility] CATH LAB;  Service: Cardiovascular;  Laterality: N/A;  . CARDIAC SURGERY    . HIP ARTHROPLASTY Right 11/10/2016   Procedure: ARTHROPLASTY BIPOLAR HIP (HEMIARTHROPLASTY);  Surgeon: Marchia Bond, MD;  Location: [facility];  Service: Orthopedics;  Laterality: Right;  . 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 LITHOTRIPSY      Allergies as of 11/06/2018      Reactions   Lisinopril Other (See Comments)   Reaction:  Respiratory arrest    Spironolactone Anaphylaxis   Vancomycin Anaphylaxis, Rash   Donepezil Diarrhea   Eggs Or Egg-derived Products Diarrhea   Erythromycin Other (See Comments)   Reaction:  Makes pt feel weird    Macrolides And Ketolides Other (See Comments)  Reaction:  Unknown    Penicillins Itching, Other (See Comments)   Has patient had a PCN reaction causing immediate rash, facial/tongue/throat swelling, SOB or lightheadedness with hypotension: No Has patient had a PCN reaction causing severe rash involving mucus membranes or skin necrosis: No Has patient had a PCN reaction that required  hospitalization No Has patient had a PCN reaction occurring within the last 10 years: No If all of the above answers are "NO", then may proceed with Cephalosporin use.   Quinapril Hcl Itching   Angiotensin Receptor Blockers Other (See Comments)   Reaction:  Unknown    Lipitor [atorvastatin] Palpitations   Pentazocine Lactate Other (See Comments)   Reaction:  Unknown    Propoxyphene Hcl Other (See Comments)   Reaction unknown   Sulfonamide Derivatives Other (See Comments)   Reaction:  Unknown       Medication List       Accurate as of November 06, 2018 11:59 PM. Always use your most recent med list.        acetaminophen 325 MG tablet Commonly known as:  TYLENOL Take 650 mg by mouth every 6 (six) hours as needed.   albuterol 108 (90 Base) MCG/ACT inhaler Commonly known as:  PROVENTIL HFA;VENTOLIN HFA Inhale 2 puffs into the lungs every 6 (six) hours as needed for wheezing or shortness of breath.   aspirin EC 81 MG tablet Take 81 mg by mouth daily.   atorvastatin 40 MG tablet Commonly known as:  LIPITOR Take 40 mg by mouth daily.   clopidogrel 75 MG tablet Commonly known as:  PLAVIX Take 75 mg by mouth daily.   furosemide 20 MG tablet Commonly known as:  LASIX Take 20 mg by mouth daily.   HUMALOG KWIKPEN 100 UNIT/ML KiwkPen Generic drug:  insulin lispro Inject 4 Units into the skin 3 (three) times daily with meals.   insulin glargine 100 UNIT/ML injection Commonly known as:  LANTUS Inject 15 Units into the skin at bedtime. If blood sugar is > 150   isosorbide mononitrate 30 MG 24 hr tablet Commonly known as:  IMDUR Take 0.5 tablets (15 mg total) by mouth daily.   memantine 10 MG tablet Commonly known as:  NAMENDA Take 10 mg by mouth 2 (two) times daily.   metFORMIN 1000 MG tablet Commonly known as:  GLUCOPHAGE Take 1,000 mg by mouth 2 (two) times daily with a meal.   metoprolol succinate 25 MG 24 hr tablet Commonly known as:  TOPROL XL Take 0.5 tablets  (12.5 mg total) by mouth daily.   mirtazapine 7.5 MG tablet Commonly known as:  REMERON Take 7.5 mg by mouth at bedtime.   mometasone-formoterol 100-5 MCG/ACT Aero Commonly known as:  DULERA Inhale 2 puffs into the lungs 2 (two) times daily.   nitroGLYCERIN 0.4 MG SL tablet Commonly known as:  NITROSTAT Place 0.4 mg under the tongue every 5 (five) minutes as needed for chest pain.   pantoprazole 40 MG tablet Commonly known as:  PROTONIX Take 40 mg by mouth daily.   polyethylene glycol packet Commonly known as:  MIRALAX / GLYCOLAX Take 17 g by mouth daily as needed for mild constipation.   sennosides-docusate sodium 8.6-50 MG tablet Commonly known as:  SENOKOT-S Take 2 tablets by mouth daily.   traMADol 50 MG tablet Commonly known as:  ULTRAM Take 50 mg by mouth 2 (two) times daily. Back pain   traZODone 50 MG tablet Commonly known as:  DESYREL Take 25 mg by mouth  at bedtime.   Vitamin D (Ergocalciferol) 1.25 MG (50000 UT) Caps capsule Commonly known as:  DRISDOL Take 50,000 Units by mouth every 7 (seven) days.       No orders of the defined types were placed in this encounter.   Immunization History  Administered Date(s) Administered  . Influenza Split 08/20/2014  . Influenza, High Dose Seasonal PF 10/06/2013  . Influenza-Unspecified 10/17/2017  . PPD Test 11/16/2016, 12/05/2016  . Pneumococcal Conjugate-13 10/17/2017  . Pneumococcal Polysaccharide-23 09/24/2009  . Tdap 10/18/2017    Social History   Tobacco Use  . Smoking status: Former Smoker    Packs/day: 1.00    Years: 25.00    Pack years: 25.00    Types: Cigarettes  . Smokeless tobacco: Never Used  . Tobacco comment: over a ppd for 40+ years; quit 10/2004.  unsuccessfully tried eCigs.  Substance Use Topics  . Alcohol use: No    Alcohol/week: 1.0 standard drinks    Types: 1 Standard drinks or equivalent per week    Review of Systems  DATA OBTAINED: from patient-very limited; nursing- no  acute concerns GENERAL:  no fevers, fatigue, appetite changes SKIN: No itching, rash HEENT: No complaint RESPIRATORY: No cough, wheezing, SOB CARDIAC: No chest pain, palpitations, lower extremity edema  GI: No abdominal pain, No N/V/D or constipation, No heartburn or reflux  GU: No dysuria, frequency or urgency, or incontinence  MUSCULOSKELETAL: No unrelieved bone/joint pain NEUROLOGIC: No headache, dizziness  PSYCHIATRIC: No overt anxiety or sadness  Vitals:   11/06/18 1415  BP: 125/74  Pulse: 70  Resp: 18  Temp: 98 F (36.7 C)   Body mass index is 20.63 kg/m. Physical Exam  GENERAL APPEARANCE: Alert, moderately conversant, No acute distress  SKIN: No diaphoresis rash HEENT: Unremarkable RESPIRATORY: Breathing is even, unlabored. Lung sounds are clear   CARDIOVASCULAR: Heart RRR no murmurs, rubs or gallops. No peripheral edema  GASTROINTESTINAL: Abdomen is soft, non-tender, not distended w/ normal bowel sounds.  GENITOURINARY: Bladder non tender, not distended  MUSCULOSKELETAL: No abnormal joints or musculature NEUROLOGIC: Cranial nerves 2-12 grossly intact. Moves all extremities PSYCHIATRIC: Mood and affect flat, no behavioral issues  Patient Active Problem List   Diagnosis Date Noted  . Insomnia 08/06/2018  . GERD (gastroesophageal reflux disease) 08/04/2017  . Dysphagia 05/10/2017  . Depression 03/14/2017  . Vitamin D deficiency 01/28/2017  . UTI due to extended-spectrum beta lactamase (ESBL) producing Escherichia coli 11/18/2016  . Urinary tract infection due to Proteus 11/18/2016  . Postoperative anemia due to acute blood loss 11/18/2016  . HTN (hypertension) 11/18/2016  . Chronic diastolic CHF (congestive heart failure) ([place]) 11/12/2016  . Closed right hip fracture ([place]) 11/10/2016  . Senile dementia without behavioral disturbance ([place]) 10/06/2015  . Obesity (BMI 30-39.9) 10/06/2015  . Tobacco use disorder 03/17/2015  . Cardiomyopathy, ischemic 03/17/2015  .  Allergy to ACE inhibitors 03/17/2015  . Ventricular tachycardia ([place]) 03/17/2015  . HLD (hyperlipidemia) 12/31/2014  . COPD (chronic obstructive pulmonary disease) ([place]) 12/31/2014  . Chronic cough 09/12/2013  . Memory deficit 04/23/2013  . Essential hypertension, benign 04/03/2013  . Dyslipidemia 04/03/2013  . Coronary atherosclerosis of native coronary artery 04/03/2013  . DM2 (diabetes mellitus, type 2) ([place]) 04/02/2013  . Fibromyalgia 04/20/2008    CMP     Component Value Date/Time   NA 140 10/14/2018   K 4.3 10/14/2018   CL 103 11/16/2016 0522   CO2 31 11/16/2016 0522   GLUCOSE 220 (H) 11/16/2016 0522   BUN 18 10/14/2018  CREATININE 0.7 10/14/2018   CREATININE 0.77 11/16/2016 0522   CALCIUM 8.2 (L) 11/16/2016 0522   PROT 7.7 04/27/2016 1724   ALBUMIN 4.4 04/27/2016 1724   AST 12 (A) 10/14/2018   ALT 12 10/14/2018   ALKPHOS 81 10/14/2018   BILITOT 1.2 04/27/2016 1724   GFRNONAA >60 11/16/2016 0522   GFRAA >60 11/16/2016 0522   Recent Labs    04/18/18 10/14/18  NA 148* 140  K 3.2* 4.3  BUN 14 18  CREATININE 0.7 0.7   Recent Labs    04/18/18 10/14/18  AST 14 12*  ALT 7 12  ALKPHOS 87 81   Recent Labs    04/18/18 10/14/18  WBC 12.9 9.2  HGB 14.8 13.2  HCT 44 39  PLT 175 172   Recent Labs    10/14/18  CHOL 127  LDLCALC 69  TRIG 106   Lab Results  Component Value Date   MICROALBUR 1.9 06/22/2017   Lab Results  Component Value Date   TSH 4.91 10/21/2018   Lab Results  Component Value Date   HGBA1C 6.8 10/14/2018   Lab Results  Component Value Date   CHOL 127 10/14/2018   HDL 37 10/14/2018   LDLCALC 69 10/14/2018   TRIG 106 10/14/2018   CHOLHDL 2 12/19/2012    Significant Diagnostic Results in last 30 days:  No results found.  Assessment and Plan  Coronary atherosclerosis of native coronary artery Reported chest pain or equivalent; continue Imdur 15 mg daily, ASA 81 mg daily and Toprol-XL 12.5 mg daily; patient is on statin and PRN  nitroglycerin sublingual  Chronic diastolic CHF (congestive heart failure) (HCC) Chronic and stable without exacerbation; continue Toprol-XL 12.5 mg daily, Lasix 40 mg daily  COPD (chronic obstructive pulmonary disease) (HCC) Chronic and stable; continue Dulera 100- 5 2 puffs twice daily     Inocencio Homes, MD

## 2018-11-09 ENCOUNTER — Encounter: Payer: Self-pay | Admitting: Internal Medicine

## 2018-11-09 NOTE — Assessment & Plan Note (Addendum)
Reported chest pain or equivalent; continue Imdur 15 mg daily, ASA 81 mg daily and Toprol-XL 12.5 mg daily; patient is on statin and PRN nitroglycerin sublingual

## 2018-11-09 NOTE — Assessment & Plan Note (Signed)
Chronic and stable without exacerbation; continue Toprol-XL 12.5 mg daily, Lasix 40 mg daily

## 2018-11-09 NOTE — Assessment & Plan Note (Signed)
Chronic and stable; continue Dulera 100- 5 2 puffs twice daily

## 2018-11-26 LAB — VITAMIN D 25 HYDROXY (VIT D DEFICIENCY, FRACTURES): Vit D, 25-Hydroxy: 14.13

## 2018-12-09 ENCOUNTER — Encounter: Payer: Self-pay | Admitting: Internal Medicine

## 2018-12-09 ENCOUNTER — Non-Acute Institutional Stay (SKILLED_NURSING_FACILITY): Payer: Medicare Other | Admitting: Internal Medicine

## 2018-12-09 DIAGNOSIS — F99 Mental disorder, not otherwise specified: Secondary | ICD-10-CM

## 2018-12-09 DIAGNOSIS — F0390 Unspecified dementia without behavioral disturbance: Secondary | ICD-10-CM | POA: Diagnosis not present

## 2018-12-09 DIAGNOSIS — F5105 Insomnia due to other mental disorder: Secondary | ICD-10-CM | POA: Diagnosis not present

## 2018-12-09 DIAGNOSIS — E785 Hyperlipidemia, unspecified: Secondary | ICD-10-CM | POA: Diagnosis not present

## 2018-12-09 NOTE — Progress Notes (Signed)
Location:  [facility] Room Number: 202D Place of Service:  SNF [phone])  [name]. Kathleen Coil, MD  Patient Care Team: Hennie Duos, MD as PCP - General (Internal Medicine)  Extended Emergency Contact Information Primary Emergency Contact: [name],[name] Address: [address]          [place], [place] [address] [name] of [place] Phone: [phone] Work Phone: [phone] Mobile Phone: [phone] Relation: Daughter Secondary Emergency Contact: [name],[name]  [place] Phone: [phone] Relation: Niece    Allergies: Lisinopril; Spironolactone; Vancomycin; Donepezil; Eggs or egg-derived products; Erythromycin; Macrolides and ketolides; Penicillins; Quinapril hcl; Angiotensin receptor blockers; Lipitor [atorvastatin]; Pentazocine lactate; Propoxyphene hcl; and Sulfonamide derivatives  Chief Complaint  Patient presents with  . Medical Management of Chronic Issues    Routine Visit    HPI: Patient is 78 y.o. female who is being seen for routine issues of dementia, hyperlipidemia, and insomnia.  Past Medical History:  Diagnosis Date  . Allergy to ACE inhibitors 03/17/2015  . Anxiety   . CAD (coronary artery disease)    a. s/p inferior STEMI 12/29/10 with rotablator atherectomy RCA 12/31/10 and DES x 2 RCA;  b. Lexiscan Myoview (02/2015): mild reversible apical anterior perfusion defect. C. cath 02/2015 50% LAD lesion with patent stent, Tx Rx    . Cardiomyopathy, ischemic 03/17/2015  . Chronic diastolic CHF (congestive heart failure) ([place]) 11/12/2016  . COPD (chronic obstructive pulmonary disease) ([place])   . Coronary atherosclerosis of native coronary artery 04/03/2013  . Dementia ([facility])   . Depression 03/14/2017  . Diabetes mellitus    type 2  . Diverticulosis   . DM2 (diabetes mellitus, type 2) ([place]) 04/02/2013  . Dysphagia 05/10/2017   Minimal 6/20 - pureed diet  . Fibromyalgia 04/20/2008   Qualifier: Diagnosis of  By:  Nelson-Smith CMA (AAMA), Dottie    . GERD (gastroesophageal reflux disease)   . Glaucoma   . HTN (hypertension)   . Hyperlipidemia   . Ischemic cardiomyopathy    EF 45%cath 2012 EF55% 5/12; 35% 11/14  . Memory deficit   . Nephrolithiasis   . Obesity (BMI 30-39.9) 10/06/2015  . Osteoarthritis   . Overweight(278.02)   . Respiratory arrest//ACE Inhibitor presumed cause   . Senile dementia without behavioral disturbance ([place]) 10/06/2015  . Sleep apnea   . Small bowel obstruction (HCC)    from a sigmoid stricture  . Ventricular tachycardia ([place]) 03/17/2015  . Vitamin D deficiency 01/28/2017    Past Surgical History:  Procedure Laterality Date  . bilateral tubal ligation    . CARDIAC CATHETERIZATION N/A 03/18/2015   Procedure: LEFT HEART CATH AND CORONARY ANGIOGRAPHY;  Surgeon: Lorretta Harp, MD;  Location: [facility] C[facility] LAB;  Service: Cardiovascular;  Laterality: N/A;  . CARDIAC SURGERY    . HIP ARTHROPLASTY Right 11/10/2016   Procedure: ARTHROPLASTY BIPOLAR HIP (HEMIARTHROPLASTY);  Surgeon: Marchia Bond, MD;  Location: [facility];  Service: Orthopedics;  Laterality: Right;  . 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 LITHOTRIPSY      Allergies as of 12/09/2018      Reactions   Lisinopril Other (See Comments)   Reaction:  Respiratory arrest    Spironolactone Anaphylaxis   Vancomycin Anaphylaxis, Rash   Donepezil Diarrhea   Eggs Or Egg-derived Products Diarrhea   Erythromycin Other (See Comments)   Reaction:  Makes pt feel weird    Macrolides And Ketolides Other (See Comments)   Reaction:  Unknown  Penicillins Itching, Other (See Comments)   Has patient had a PCN reaction causing immediate rash, facial/tongue/throat swelling, SOB or lightheadedness with hypotension: No Has patient had a PCN reaction causing severe rash involving mucus membranes or skin necrosis: No Has patient had a PCN reaction that required hospitalization No Has patient had a PCN  reaction occurring within the last 10 years: No If all of the above answers are "NO", then may proceed with Cephalosporin use.   Quinapril Hcl Itching   Angiotensin Receptor Blockers Other (See Comments)   Reaction:  Unknown    Lipitor [atorvastatin] Palpitations   Pentazocine Lactate Other (See Comments)   Reaction:  Unknown    Propoxyphene Hcl Other (See Comments)   Reaction unknown   Sulfonamide Derivatives Other (See Comments)   Reaction:  Unknown       Medication List       Accurate as of December 09, 2018 11:59 PM. Always use your most recent med list.        acetaminophen 325 MG tablet Commonly known as:  TYLENOL Take 650 mg by mouth every 6 (six) hours as needed.   albuterol 108 (90 Base) MCG/ACT inhaler Commonly known as:  PROVENTIL HFA;VENTOLIN HFA Inhale 2 puffs into the lungs every 6 (six) hours as needed for wheezing or shortness of breath.   aspirin EC 81 MG tablet Take 81 mg by mouth daily.   atorvastatin 40 MG tablet Commonly known as:  LIPITOR Take 40 mg by mouth daily.   clopidogrel 75 MG tablet Commonly known as:  PLAVIX Take 75 mg by mouth daily.   furosemide 20 MG tablet Commonly known as:  LASIX Take 20 mg by mouth daily.   HUMALOG KWIKPEN 100 UNIT/ML KiwkPen Generic drug:  insulin lispro Inject 4 Units into the skin 3 (three) times daily with meals.   insulin glargine 100 UNIT/ML injection Commonly known as:  LANTUS Inject 15 Units into the skin at bedtime. If blood sugar is > 150   isosorbide mononitrate 30 MG 24 hr tablet Commonly known as:  IMDUR Take 0.5 tablets (15 mg total) by mouth daily.   memantine 10 MG tablet Commonly known as:  NAMENDA Take 10 mg by mouth 2 (two) times daily.   metFORMIN 1000 MG tablet Commonly known as:  GLUCOPHAGE Take 1,000 mg by mouth 2 (two) times daily with a meal.   metoprolol succinate 25 MG 24 hr tablet Commonly known as:  TOPROL XL Take 0.5 tablets (12.5 mg total) by mouth daily.     mirtazapine 7.5 MG tablet Commonly known as:  REMERON Take 7.5 mg by mouth at bedtime.   mometasone-formoterol 100-5 MCG/ACT Aero Commonly known as:  DULERA Inhale 2 puffs into the lungs 2 (two) times daily.   nitroGLYCERIN 0.4 MG SL tablet Commonly known as:  NITROSTAT Place 0.4 mg under the tongue every 5 (five) minutes as needed for chest pain.   polyethylene glycol packet Commonly known as:  MIRALAX / GLYCOLAX Take 17 g by mouth daily as needed for mild constipation.   sennosides-docusate sodium 8.6-50 MG tablet Commonly known as:  SENOKOT-S Take 2 tablets by mouth daily.   traMADol 50 MG tablet Commonly known as:  ULTRAM Take 50 mg by mouth 2 (two) times daily. Back pain   traZODone 50 MG tablet Commonly known as:  DESYREL Take 25 mg by mouth at bedtime.   Vitamin D (Ergocalciferol) 1.25 MG (50000 UT) Caps capsule Commonly known as:  DRISDOL Take 50,000 Units by  mouth every 7 (seven) days.       No orders of the defined types were placed in this encounter.   Immunization History  Administered Date(s) Administered  . Influenza Split 08/20/2014  . Influenza, High Dose Seasonal PF 10/06/2013  . Influenza-Unspecified 10/17/2017  . PPD Test 11/16/2016, 12/05/2016  . Pneumococcal Conjugate-13 10/17/2017  . Pneumococcal Polysaccharide-23 09/24/2009  . Tdap 10/18/2017    Social History   Tobacco Use  . Smoking status: Former Smoker    Packs/day: 1.00    Years: 25.00    Pack years: 25.00    Types: Cigarettes  . Smokeless tobacco: Never Used  . Tobacco comment: over a ppd for 40+ years; quit 10/2004.  unsuccessfully tried eCigs.  Substance Use Topics  . Alcohol use: No    Alcohol/week: 1.0 standard drinks    Types: 1 Standard drinks or equivalent per week    Review of Systems  DATA OBTAINED: from patient-limited; nursing-no acute concerns GENERAL:  no fevers, fatigue, appetite changes SKIN: No itching, rash HEENT: No complaint RESPIRATORY: No cough,  wheezing, SOB CARDIAC: No chest pain, palpitations, lower extremity edema  GI: No abdominal pain, No N/V/D or constipation, No heartburn or reflux  GU: No dysuria, frequency or urgency, or incontinence  MUSCULOSKELETAL: No unrelieved bone/joint pain NEUROLOGIC: No headache, dizziness  PSYCHIATRIC: No overt anxiety or sadness  Vitals:   12/09/18 1342  BP: 116/70  Pulse: 75  Resp: 20  Temp: 98 F (36.7 C)   Body mass index is 21.32 kg/m. Physical Exam  GENERAL APPEARANCE: Alert, moderately conversant, No acute distress  SKIN: No diaphoresis rash HEENT: Unremarkable RESPIRATORY: Breathing is even, unlabored. Lung sounds are clear   CARDIOVASCULAR: Heart RRR no murmurs, rubs or gallops. No peripheral edema  GASTROINTESTINAL: Abdomen is soft, non-tender, not distended w/ normal bowel sounds.  GENITOURINARY: Bladder non tender, not distended  MUSCULOSKELETAL: No abnormal joints or musculature NEUROLOGIC: Cranial nerves 2-12 grossly intact. Moves all extremities PSYCHIATRIC: Mood and affect labs with dementia, no behavioral issues  Patient Active Problem List   Diagnosis Date Noted  . Insomnia 08/06/2018  . GERD (gastroesophageal reflux disease) 08/04/2017  . Dysphagia 05/10/2017  . Depression 03/14/2017  . Vitamin D deficiency 01/28/2017  . UTI due to extended-spectrum beta lactamase (ESBL) producing Escherichia coli 11/18/2016  . Urinary tract infection due to Proteus 11/18/2016  . Postoperative anemia due to acute blood loss 11/18/2016  . HTN (hypertension) 11/18/2016  . Chronic diastolic CHF (congestive heart failure) ([place]) 11/12/2016  . Closed right hip fracture ([place]) 11/10/2016  . Senile dementia without behavioral disturbance ([place]) 10/06/2015  . Obesity (BMI 30-39.9) 10/06/2015  . Tobacco use disorder 03/17/2015  . Cardiomyopathy, ischemic 03/17/2015  . Allergy to ACE inhibitors 03/17/2015  . Ventricular tachycardia ([place]) 03/17/2015  . HLD (hyperlipidemia) 12/31/2014   . COPD (chronic obstructive pulmonary disease) ([place]) 12/31/2014  . Chronic cough 09/12/2013  . Memory deficit 04/23/2013  . Essential hypertension, benign 04/03/2013  . Dyslipidemia 04/03/2013  . Coronary atherosclerosis of native coronary artery 04/03/2013  . DM2 (diabetes mellitus, type 2) ([place]) 04/02/2013  . Fibromyalgia 04/20/2008    CMP     Component Value Date/Time   NA 140 10/14/2018   K 4.3 10/14/2018   CL 103 11/16/2016 0522   CO2 31 11/16/2016 0522   GLUCOSE 220 (H) 11/16/2016 0522   BUN 18 10/14/2018   CREATININE 0.7 10/14/2018   CREATININE 0.77 11/16/2016 0522   CALCIUM 8.2 (L) 11/16/2016 0522   PROT 7.7  04/27/2016 1724   ALBUMIN 4.4 04/27/2016 1724   AST 12 (A) 10/14/2018   ALT 12 10/14/2018   ALKPHOS 81 10/14/2018   BILITOT 1.2 04/27/2016 1724   GFRNONAA >60 11/16/2016 0522   GFRAA >60 11/16/2016 0522   Recent Labs    04/18/18 10/14/18  NA 148* 140  K 3.2* 4.3  BUN 14 18  CREATININE 0.7 0.7   Recent Labs    04/18/18 10/14/18  AST 14 12*  ALT 7 12  ALKPHOS 87 81   Recent Labs    04/18/18 10/14/18  WBC 12.9 9.2  HGB 14.8 13.2  HCT 44 39  PLT 175 172   Recent Labs    10/14/18  CHOL 127  LDLCALC 69  TRIG 106   Lab Results  Component Value Date   MICROALBUR 1.9 06/22/2017   Lab Results  Component Value Date   TSH 4.91 10/21/2018   Lab Results  Component Value Date   HGBA1C 6.8 10/14/2018   Lab Results  Component Value Date   CHOL 127 10/14/2018   HDL 37 10/14/2018   LDLCALC 69 10/14/2018   TRIG 106 10/14/2018   CHOLHDL 2 12/19/2012    Significant Diagnostic Results in last 30 days:  No results found.  Assessment and Plan  Senile dementia without behavioral disturbance Stable at the moment; continue Namenda 10 mg twice daily  HLD (hyperlipidemia) LDL 69, HDL 37, good control; continue Lipitor 40 mg daily  Insomnia No reports of problems; continue trazodone 25 mg nightly    Maury Groninger D. Kathleen Coil, MD

## 2018-12-10 ENCOUNTER — Encounter: Payer: Self-pay | Admitting: Internal Medicine

## 2018-12-10 NOTE — Assessment & Plan Note (Signed)
Stable at the moment; continue Namenda 10 mg twice daily

## 2018-12-10 NOTE — Assessment & Plan Note (Signed)
LDL 69, HDL 37, good control; continue Lipitor 40 mg daily

## 2018-12-10 NOTE — Assessment & Plan Note (Signed)
No reports of problems; continue trazodone 25 mg nightly

## 2018-12-12 ENCOUNTER — Other Ambulatory Visit: Payer: Self-pay

## 2018-12-14 MED ORDER — TRAMADOL HCL 50 MG PO TABS: 50.0000 mg | ORAL_TABLET | Freq: Two times a day (BID) | ORAL | 0 refills | Status: DC

## 2019-01-06 ENCOUNTER — Encounter: Payer: Self-pay | Admitting: Internal Medicine

## 2019-01-06 ENCOUNTER — Non-Acute Institutional Stay (SKILLED_NURSING_FACILITY): Payer: Medicare Other | Admitting: Internal Medicine

## 2019-01-06 DIAGNOSIS — F329 Major depressive disorder, single episode, unspecified: Secondary | ICD-10-CM

## 2019-01-06 DIAGNOSIS — F32A Depression, unspecified: Secondary | ICD-10-CM

## 2019-01-06 DIAGNOSIS — R1312 Dysphagia, oropharyngeal phase: Secondary | ICD-10-CM | POA: Diagnosis not present

## 2019-01-06 DIAGNOSIS — K219 Gastro-esophageal reflux disease without esophagitis: Secondary | ICD-10-CM | POA: Diagnosis not present

## 2019-01-06 NOTE — Progress Notes (Signed)
Location:  [facility] Room Number: 202W Place of Service:  SNF [phone])  [name]. Kathleen Coil, MD  Patient Care Team: Hennie Duos, MD as PCP - General (Internal Medicine)  Extended Emergency Contact Information Primary Emergency Contact: [name],[name] Address: [address]          [place], [place] [address] [name] of [place] Phone: [phone] Work Phone: [phone] Mobile Phone: [phone] Relation: Daughter Secondary Emergency Contact: [name],[name]  [place] Phone: [phone] Relation: Niece    Allergies: Lisinopril; Spironolactone; Vancomycin; Donepezil; Eggs or egg-derived products; Erythromycin; Macrolides and ketolides; Penicillins; Quinapril hcl; Angiotensin receptor blockers; Lipitor [atorvastatin]; Pentazocine lactate; Propoxyphene hcl; and Sulfonamide derivatives  Chief Complaint  Patient presents with  . Medical Management of Chronic Issues    Routine visit    HPI: Patient is 78 y.o. female who is being seen for routine issues of dysphasia, GERD, and depression.  Past Medical History:  Diagnosis Date  . Allergy to ACE inhibitors 03/17/2015  . Anxiety   . CAD (coronary artery disease)    a. s/p inferior STEMI 12/29/10 with rotablator atherectomy RCA 12/31/10 and DES x 2 RCA;  b. Lexiscan Myoview (02/2015): mild reversible apical anterior perfusion defect. C. cath 02/2015 50% LAD lesion with patent stent, Tx Rx    . Cardiomyopathy, ischemic 03/17/2015  . Chronic diastolic CHF (congestive heart failure) ([place]) 11/12/2016  . COPD (chronic obstructive pulmonary disease) ([place])   . Coronary atherosclerosis of native coronary artery 04/03/2013  . Dementia ([place])   . Depression 03/14/2017  . Diabetes mellitus    type 2  . Diverticulosis   . DM2 (diabetes mellitus, type 2) ([place]) 04/02/2013  . Dysphagia 05/10/2017   Minimal 6/20 - pureed diet  . Fibromyalgia 04/20/2008   Qualifier: Diagnosis of  By:  Nelson-Smith CMA (AAMA), Dottie    . GERD (gastroesophageal reflux disease)   . Glaucoma   . HTN (hypertension)   . Hyperlipidemia   . Ischemic cardiomyopathy    EF 45%cath 2012 EF55% 5/12; 35% 11/14  . Memory deficit   . Nephrolithiasis   . Obesity (BMI 30-39.9) 10/06/2015  . Osteoarthritis   . Overweight(278.02)   . Respiratory arrest//ACE Inhibitor presumed cause   . Senile dementia without behavioral disturbance ([place]) 10/06/2015  . Sleep apnea   . Small bowel obstruction (HCC)    from a sigmoid stricture  . Ventricular tachycardia ([place]) 03/17/2015  . Vitamin D deficiency 01/28/2017    Past Surgical History:  Procedure Laterality Date  . bilateral tubal ligation    . CARDIAC CATHETERIZATION N/A 03/18/2015   Procedure: LEFT HEART CATH AND CORONARY ANGIOGRAPHY;  Surgeon: Lorretta Harp, MD;  Location: [facility] CATH LAB;  Service: Cardiovascular;  Laterality: N/A;  . CARDIAC SURGERY    . HIP ARTHROPLASTY Right 11/10/2016   Procedure: ARTHROPLASTY BIPOLAR HIP (HEMIARTHROPLASTY);  Surgeon: Marchia Bond, MD;  Location: [facility];  Service: Orthopedics;  Laterality: Right;  . 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 LITHOTRIPSY      Allergies as of 01/06/2019      Reactions   Lisinopril Other (See Comments)   Reaction:  Respiratory arrest    Spironolactone Anaphylaxis   Vancomycin Anaphylaxis, Rash   Donepezil Diarrhea   Eggs Or Egg-derived Products Diarrhea   Erythromycin Other (See Comments)   Reaction:  Makes pt feel weird    Macrolides And Ketolides Other (See Comments)   Reaction:  Unknown  Penicillins Itching, Other (See Comments)   Has patient had a PCN reaction causing immediate rash, facial/tongue/throat swelling, SOB or lightheadedness with hypotension: No Has patient had a PCN reaction causing severe rash involving mucus membranes or skin necrosis: No Has patient had a PCN reaction that required hospitalization No Has patient had a PCN  reaction occurring within the last 10 years: No If all of the above answers are "NO", then may proceed with Cephalosporin use.   Quinapril Hcl Itching   Angiotensin Receptor Blockers Other (See Comments)   Reaction:  Unknown    Lipitor [atorvastatin] Palpitations   Pentazocine Lactate Other (See Comments)   Reaction:  Unknown    Propoxyphene Hcl Other (See Comments)   Reaction unknown   Sulfonamide Derivatives Other (See Comments)   Reaction:  Unknown       Medication List       Accurate as of January 06, 2019 11:59 PM. Always use your most recent med list.        acetaminophen 325 MG tablet Commonly known as:  TYLENOL Take 650 mg by mouth every 6 (six) hours as needed.   albuterol 108 (90 Base) MCG/ACT inhaler Commonly known as:  PROVENTIL HFA;VENTOLIN HFA Inhale 2 puffs into the lungs every 6 (six) hours as needed for wheezing or shortness of breath.   aspirin EC 81 MG tablet Take 81 mg by mouth daily.   atorvastatin 40 MG tablet Commonly known as:  LIPITOR Take 40 mg by mouth daily.   clopidogrel 75 MG tablet Commonly known as:  PLAVIX Take 75 mg by mouth daily.   furosemide 20 MG tablet Commonly known as:  LASIX Take 20 mg by mouth daily.   HUMALOG KWIKPEN 100 UNIT/ML KiwkPen Generic drug:  insulin lispro Inject 4 Units into the skin 3 (three) times daily with meals.   insulin glargine 100 UNIT/ML injection Commonly known as:  LANTUS Inject 15 Units into the skin at bedtime. If blood sugar is > 150   isosorbide mononitrate 30 MG 24 hr tablet Commonly known as:  IMDUR Take 0.5 tablets (15 mg total) by mouth daily.   memantine 10 MG tablet Commonly known as:  NAMENDA Take 10 mg by mouth 2 (two) times daily.   metFORMIN 1000 MG tablet Commonly known as:  GLUCOPHAGE Take 1,000 mg by mouth 2 (two) times daily with a meal.   metoprolol succinate 25 MG 24 hr tablet Commonly known as:  TOPROL XL Take 0.5 tablets (12.5 mg total) by mouth daily.     mirtazapine 7.5 MG tablet Commonly known as:  REMERON Take 7.5 mg by mouth at bedtime.   mometasone-formoterol 100-5 MCG/ACT Aero Commonly known as:  DULERA Inhale 2 puffs into the lungs 2 (two) times daily.   nitroGLYCERIN 0.4 MG SL tablet Commonly known as:  NITROSTAT Place 0.4 mg under the tongue every 5 (five) minutes as needed for chest pain.   polyethylene glycol packet Commonly known as:  MIRALAX / GLYCOLAX Take 17 g by mouth daily as needed for mild constipation.   sennosides-docusate sodium 8.6-50 MG tablet Commonly known as:  SENOKOT-S Take 2 tablets by mouth daily.   traMADol 50 MG tablet Commonly known as:  ULTRAM Take 1 tablet (50 mg total) by mouth 2 (two) times daily. Back pain   traZODone 50 MG tablet Commonly known as:  DESYREL Take 25 mg by mouth at bedtime.   Vitamin D (Ergocalciferol) 1.25 MG (50000 UT) Caps capsule Commonly known as:  DRISDOL Take  50,000 Units by mouth every 7 (seven) days.       No orders of the defined types were placed in this encounter.   Immunization History  Administered Date(s) Administered  . Influenza Split 08/20/2014  . Influenza, High Dose Seasonal PF 10/06/2013  . Influenza-Unspecified 10/17/2017  . PPD Test 11/16/2016, 12/05/2016  . Pneumococcal Conjugate-13 10/17/2017  . Pneumococcal Polysaccharide-23 09/24/2009  . Tdap 10/18/2017    Social History   Tobacco Use  . Smoking status: Former Smoker    Packs/day: 1.00    Years: 25.00    Pack years: 25.00    Types: Cigarettes  . Smokeless tobacco: Never Used  . Tobacco comment: over a ppd for 40+ years; quit 10/2004.  unsuccessfully tried eCigs.  Substance Use Topics  . Alcohol use: No    Alcohol/week: 1.0 standard drinks    Types: 1 Standard drinks or equivalent per week    Review of Systems  DATA OBTAINED: from patient-limited; nursing- no acute concerns GENERAL:  no fevers, fatigue, appetite changes SKIN: No itching, rash HEENT: No  complaint RESPIRATORY: No cough, wheezing, SOB CARDIAC: No chest pain, palpitations, lower extremity edema  GI: No abdominal pain, No N/V/D or constipation, No heartburn or reflux  GU: No dysuria, frequency or urgency, or incontinence  MUSCULOSKELETAL: No unrelieved bone/joint pain NEUROLOGIC: No headache, dizziness  PSYCHIATRIC: No overt anxiety or sadness  Vitals:   01/06/19 1051  BP: 116/74  Pulse: 73  Resp: 16  Temp: (!) 97.1 F (36.2 C)   Body mass index is 21.77 kg/m. Physical Exam  GENERAL APPEARANCE: Alert, min-mod conversant, No acute distress  SKIN: No diaphoresis rash HEENT: Unremarkable RESPIRATORY: Breathing is even, unlabored. Lung sounds are clear   CARDIOVASCULAR: Heart RRR no murmurs, rubs or gallops. No peripheral edema  GASTROINTESTINAL: Abdomen is soft, non-tender, not distended w/ normal bowel sounds.  GENITOURINARY: Bladder non tender, not distended  MUSCULOSKELETAL: No abnormal joints or musculature NEUROLOGIC: Cranial nerves 2-12 grossly intact. Moves all extremities PSYCHIATRIC: Mood and affect flat and with dementia, no behavioral issues  Patient Active Problem List   Diagnosis Date Noted  . Insomnia 08/06/2018  . GERD (gastroesophageal reflux disease) 08/04/2017  . Dysphagia 05/10/2017  . Depression 03/14/2017  . Vitamin D deficiency 01/28/2017  . UTI due to extended-spectrum beta lactamase (ESBL) producing Escherichia coli 11/18/2016  . Urinary tract infection due to Proteus 11/18/2016  . Postoperative anemia due to acute blood loss 11/18/2016  . HTN (hypertension) 11/18/2016  . Chronic diastolic CHF (congestive heart failure) ([place]) 11/12/2016  . Closed right hip fracture ([place]) 11/10/2016  . Senile dementia without behavioral disturbance ([place]) 10/06/2015  . Obesity (BMI 30-39.9) 10/06/2015  . Tobacco use disorder 03/17/2015  . Cardiomyopathy, ischemic 03/17/2015  . Allergy to ACE inhibitors 03/17/2015  . Ventricular tachycardia ([place])  03/17/2015  . HLD (hyperlipidemia) 12/31/2014  . COPD (chronic obstructive pulmonary disease) ([place]) 12/31/2014  . Chronic cough 09/12/2013  . Memory deficit 04/23/2013  . Essential hypertension, benign 04/03/2013  . Dyslipidemia 04/03/2013  . Coronary atherosclerosis of native coronary artery 04/03/2013  . DM2 (diabetes mellitus, type 2) ([place]) 04/02/2013  . Fibromyalgia 04/20/2008    CMP     Component Value Date/Time   NA 140 10/14/2018   K 4.3 10/14/2018   CL 103 11/16/2016 0522   CO2 31 11/16/2016 0522   GLUCOSE 220 (H) 11/16/2016 0522   BUN 18 10/14/2018   CREATININE 0.7 10/14/2018   CREATININE 0.77 11/16/2016 0522   CALCIUM 8.2 (L)  11/16/2016 0522   PROT 7.7 04/27/2016 1724   ALBUMIN 4.4 04/27/2016 1724   AST 12 (A) 10/14/2018   ALT 12 10/14/2018   ALKPHOS 81 10/14/2018   BILITOT 1.2 04/27/2016 1724   GFRNONAA >60 11/16/2016 0522   GFRAA >60 11/16/2016 0522   Recent Labs    04/18/18 10/14/18  NA 148* 140  K 3.2* 4.3  BUN 14 18  CREATININE 0.7 0.7   Recent Labs    04/18/18 10/14/18  AST 14 12*  ALT 7 12  ALKPHOS 87 81   Recent Labs    04/18/18 10/14/18  WBC 12.9 9.2  HGB 14.8 13.2  HCT 44 39  PLT 175 172   Recent Labs    10/14/18  CHOL 127  LDLCALC 69  TRIG 106   Lab Results  Component Value Date   MICROALBUR 1.9 06/22/2017   Lab Results  Component Value Date   TSH 4.91 10/21/2018   Lab Results  Component Value Date   HGBA1C 6.8 10/14/2018   Lab Results  Component Value Date   CHOL 127 10/14/2018   HDL 37 10/14/2018   LDLCALC 69 10/14/2018   TRIG 106 10/14/2018   CHOLHDL 2 12/19/2012    Significant Diagnostic Results in last 30 days:  No results found.  Assessment and Plan  Dysphagia Chronic and stable; continue pured diet.  GERD (gastroesophageal reflux disease) No reports of reflux or aspiration; continue Protonix 40 mg daily  Depression Chronic and stable; continue trazodone 25 mg nightly and Remeron 15 mg nightly      Eadie Repetto D. Kathleen Coil, MD

## 2019-01-07 ENCOUNTER — Encounter: Payer: Self-pay | Admitting: Internal Medicine

## 2019-01-07 NOTE — Assessment & Plan Note (Signed)
No reports of reflux or aspiration; continue Protonix 40 mg daily 

## 2019-01-07 NOTE — Assessment & Plan Note (Signed)
Chronic and stable; continue pured diet.

## 2019-01-07 NOTE — Assessment & Plan Note (Signed)
Chronic and stable; continue trazodone 25 mg nightly and Remeron 15 mg nightly

## 2019-02-03 ENCOUNTER — Encounter: Payer: Self-pay | Admitting: Internal Medicine

## 2019-02-03 ENCOUNTER — Non-Acute Institutional Stay (SKILLED_NURSING_FACILITY): Payer: Medicare Other | Admitting: Internal Medicine

## 2019-02-03 DIAGNOSIS — M797 Fibromyalgia: Secondary | ICD-10-CM

## 2019-02-03 DIAGNOSIS — E559 Vitamin D deficiency, unspecified: Secondary | ICD-10-CM | POA: Diagnosis not present

## 2019-02-03 DIAGNOSIS — I10 Essential (primary) hypertension: Secondary | ICD-10-CM

## 2019-02-03 NOTE — Progress Notes (Signed)
Location:  [facility] Room Number: 202W Place of Service:  SNF [phone])  [name]. [name], MD  Patient Care Team: Hennie Duos, MD as PCP - General (Internal Medicine)  Extended Emergency Contact Information Primary Emergency Contact: [name],[name] Address: [address]          [place], [place] [address] [name] of [place] Phone: [phone] Work Phone: [phone] Mobile Phone: [phone] Relation: Daughter Secondary Emergency Contact: [name],[name]  [place] Phone: [phone] Relation: Niece    Allergies: Lisinopril; Spironolactone; Vancomycin; Donepezil; Eggs or egg-derived products; Erythromycin; Macrolides and ketolides; Penicillins; Quinapril hcl; Angiotensin receptor blockers; Lipitor [atorvastatin]; Pentazocine lactate; Propoxyphene hcl; and Sulfonamide derivatives  Chief Complaint  Patient presents with  . Medical Management of Chronic Issues    Routine visit    HPI: Patient is 78 y.o. female who is being seen for routine issues of hypertension, vitamin D deficiency, and fibromyalgia.  Past Medical History:  Diagnosis Date  . Allergy to ACE inhibitors 03/17/2015  . Anxiety   . CAD (coronary artery disease)    a. s/p inferior STEMI 12/29/10 with rotablator atherectomy RCA 12/31/10 and DES x 2 RCA;  b. Lexiscan Myoview (02/2015): mild reversible apical anterior perfusion defect. C. cath 02/2015 50% LAD lesion with patent stent, Tx Rx    . Cardiomyopathy, ischemic 03/17/2015  . Chronic diastolic CHF (congestive heart failure) ([place]) 11/12/2016  . COPD (chronic obstructive pulmonary disease) ([place])   . Coronary atherosclerosis of native coronary artery 04/03/2013  . Dementia ([place])   . Depression 03/14/2017  . Diabetes mellitus    type 2  . Diverticulosis   . DM2 (diabetes mellitus, type 2) ([place]) 04/02/2013  . Dysphagia 05/10/2017   Minimal 6/20 - pureed diet  . Fibromyalgia 04/20/2008   Qualifier:  Diagnosis of  By: Nelson-Smith CMA (AAMA), Dottie    . GERD (gastroesophageal reflux disease)   . Glaucoma   . HTN (hypertension)   . Hyperlipidemia   . Ischemic cardiomyopathy    EF 45%cath 2012 EF55% 5/12; 35% 11/14  . Memory deficit   . Nephrolithiasis   . Obesity (BMI 30-39.9) 10/06/2015  . Osteoarthritis   . Overweight(278.02)   . Respiratory arrest//ACE Inhibitor presumed cause   . Senile dementia without behavioral disturbance ([place]) 10/06/2015  . Sleep apnea   . Small bowel obstruction (HCC)    from a sigmoid stricture  . Ventricular tachycardia ([place]) 03/17/2015  . Vitamin D deficiency 01/28/2017    Past Surgical History:  Procedure Laterality Date  . bilateral tubal ligation    . CARDIAC CATHETERIZATION N/A 03/18/2015   Procedure: LEFT HEART CATH AND CORONARY ANGIOGRAPHY;  Surgeon: Lorretta Harp, MD;  Location: [facility] CATH LAB;  Service: Cardiovascular;  Laterality: N/A;  . CARDIAC SURGERY    . HIP ARTHROPLASTY Right 11/10/2016   Procedure: ARTHROPLASTY BIPOLAR HIP (HEMIARTHROPLASTY);  Surgeon: Marchia Bond, MD;  Location: [facility];  Service: Orthopedics;  Laterality: Right;  . 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 LITHOTRIPSY      Allergies as of 02/03/2019      Reactions   Lisinopril Other (See Comments)   Reaction:  Respiratory arrest    Spironolactone Anaphylaxis   Vancomycin Anaphylaxis, Rash   Donepezil Diarrhea   Eggs Or Egg-derived Products Diarrhea   Erythromycin Other (See Comments)   Reaction:  Makes pt feel weird    Macrolides And Ketolides Other (See Comments)   Reaction:  Unknown    Penicillins Itching, Other (See Comments)   Has patient had a PCN reaction causing immediate rash, facial/tongue/throat swelling, SOB or lightheadedness with hypotension: No Has patient had a PCN reaction causing severe rash involving mucus membranes or skin necrosis: No Has patient had a PCN reaction that required hospitalization No Has  patient had a PCN reaction occurring within the last 10 years: No If all of the above answers are "NO", then may proceed with Cephalosporin use.   Quinapril Hcl Itching   Angiotensin Receptor Blockers Other (See Comments)   Reaction:  Unknown    Lipitor [atorvastatin] Palpitations   Pentazocine Lactate Other (See Comments)   Reaction:  Unknown    Propoxyphene Hcl Other (See Comments)   Reaction unknown   Sulfonamide Derivatives Other (See Comments)   Reaction:  Unknown       Medication List       Accurate as of February 03, 2019 11:59 PM. Always use your most recent med list.        acetaminophen 325 MG tablet Commonly known as:  TYLENOL Take 650 mg by mouth every 6 (six) hours as needed.   albuterol 108 (90 Base) MCG/ACT inhaler Commonly known as:  PROVENTIL HFA;VENTOLIN HFA Inhale 2 puffs into the lungs every 6 (six) hours as needed for wheezing or shortness of breath.   aspirin EC 81 MG tablet Take 81 mg by mouth daily.   atorvastatin 40 MG tablet Commonly known as:  LIPITOR Take 40 mg by mouth daily.   clopidogrel 75 MG tablet Commonly known as:  PLAVIX Take 75 mg by mouth daily.   furosemide 20 MG tablet Commonly known as:  LASIX Take 20 mg by mouth daily.   HumaLOG KwikPen 100 UNIT/ML KiwkPen Generic drug:  insulin lispro Inject 4 Units into the skin 3 (three) times daily with meals.   insulin glargine 100 UNIT/ML injection Commonly known as:  LANTUS Inject 15 Units into the skin at bedtime. If blood sugar is > 150   isosorbide mononitrate 30 MG 24 hr tablet Commonly known as:  IMDUR Take 0.5 tablets (15 mg total) by mouth daily.   memantine 10 MG tablet Commonly known as:  NAMENDA Take 10 mg by mouth 2 (two) times daily.   metFORMIN 1000 MG tablet Commonly known as:  GLUCOPHAGE Take 1,000 mg by mouth 2 (two) times daily with a meal.   metoprolol succinate 25 MG 24 hr tablet Commonly known as:  Toprol XL Take 0.5 tablets (12.5 mg total) by mouth  daily.   mirtazapine 7.5 MG tablet Commonly known as:  REMERON Take 7.5 mg by mouth at bedtime.   mometasone-formoterol 100-5 MCG/ACT Aero Commonly known as:  DULERA Inhale 2 puffs into the lungs 2 (two) times daily.   nitroGLYCERIN 0.4 MG SL tablet Commonly known as:  NITROSTAT Place 0.4 mg under the tongue every 5 (five) minutes as needed for chest pain.   polyethylene glycol packet Commonly known as:  MIRALAX / GLYCOLAX Take 17 g by mouth daily as needed for mild constipation.   sennosides-docusate sodium 8.6-50 MG tablet Commonly known as:  SENOKOT-S Take 2 tablets by mouth daily.   traMADol 50 MG tablet Commonly known as:  ULTRAM Take 1 tablet (50 mg total) by mouth 2 (two) times daily. Back pain   traZODone 50 MG tablet Commonly known as:  DESYREL Take 25 mg by mouth at bedtime.   Vitamin D (Ergocalciferol) 1.25 MG (50000 UT) Caps capsule Commonly known as:  DRISDOL Take 50,000 Units by mouth every 7 (seven) days.       No orders of the defined types were placed in this encounter.   Immunization History  Administered Date(s) Administered  . Influenza Split 08/20/2014  . Influenza, High Dose Seasonal PF 10/06/2013  . Influenza-Unspecified 10/17/2017  . PPD Test 11/16/2016, 12/05/2016  . Pneumococcal Conjugate-13 10/17/2017  . Pneumococcal Polysaccharide-23 09/24/2009  . Tdap 10/18/2017    Social History   Tobacco Use  . Smoking status: Former Smoker    Packs/day: 1.00    Years: 25.00    Pack years: 25.00    Types: Cigarettes  . Smokeless tobacco: Never Used  . Tobacco comment: over a ppd for 40+ years; quit 10/2004.  unsuccessfully tried eCigs.  Substance Use Topics  . Alcohol use: No    Alcohol/week: 1.0 standard drinks    Types: 1 Standard drinks or equivalent per week    Review of Systems  DATA OBTAINED: from patient-limited; nursing- concern for +/-appetite GENERAL:  no fevers, fatigue, appetite changes SKIN: No itching, rash HEENT: No  complaint RESPIRATORY: No cough, wheezing, SOB CARDIAC: No chest pain, palpitations, lower extremity edema  GI: No abdominal pain, No N/V/D or constipation, No heartburn or reflux  GU: No dysuria, frequency or urgency, or incontinence  MUSCULOSKELETAL: No unrelieved bone/joint pain NEUROLOGIC: No headache, dizziness  PSYCHIATRIC: No overt anxiety or sadness  Vitals:   02/03/19 1243  BP: 118/70  Pulse: 78  Resp: 18  Temp: 98.6 F (37 C)   Body mass index is 23.45 kg/m. Physical Exam  GENERAL APPEARANCE: Alert, moderately conversant, No acute distress  SKIN: No diaphoresis rash HEENT: Unremarkable RESPIRATORY: Breathing is even, unlabored. Lung sounds are clear   CARDIOVASCULAR: Heart RRR no murmurs, rubs or gallops. No peripheral edema  GASTROINTESTINAL: Abdomen is soft, non-tender, not distended w/ normal bowel sounds.  GENITOURINARY: Bladder non tender, not distended  MUSCULOSKELETAL: No abnormal joints or musculature NEUROLOGIC: Cranial nerves 2-12 grossly intact. Moves all extremities PSYCHIATRIC: Mood and affect but, no behavioral issues  Patient Active Problem List   Diagnosis Date Noted  . Insomnia 08/06/2018  . GERD (gastroesophageal reflux disease) 08/04/2017  . Dysphagia 05/10/2017  . Depression 03/14/2017  . Vitamin D deficiency 01/28/2017  . UTI due to extended-spectrum beta lactamase (ESBL) producing Escherichia coli 11/18/2016  . Urinary tract infection due to Proteus 11/18/2016  . Postoperative anemia due to acute blood loss 11/18/2016  . HTN (hypertension) 11/18/2016  . Chronic diastolic CHF (congestive heart failure) ([place]) 11/12/2016  . Closed right hip fracture ([place]) 11/10/2016  . Senile dementia without behavioral disturbance ([place]) 10/06/2015  . Obesity (BMI 30-39.9) 10/06/2015  . Tobacco use disorder 03/17/2015  . Cardiomyopathy, ischemic 03/17/2015  . Allergy to ACE inhibitors 03/17/2015  . Ventricular tachycardia ([place]) 03/17/2015  . HLD  (hyperlipidemia) 12/31/2014  . COPD (chronic obstructive pulmonary disease) ([place]) 12/31/2014  . Chronic cough 09/12/2013  . Memory deficit 04/23/2013  . Essential hypertension, benign 04/03/2013  . Dyslipidemia 04/03/2013  . Coronary atherosclerosis of native coronary artery 04/03/2013  . DM2 (diabetes mellitus, type 2) ([place]) 04/02/2013  . Fibromyalgia 04/20/2008    CMP     Component Value Date/Time   NA 140 10/14/2018   K 4.3 10/14/2018   CL 103 11/16/2016 0522   CO2 31 11/16/2016 0522   GLUCOSE 220 (H) 11/16/2016 0522   BUN 18 10/14/2018   CREATININE 0.7 10/14/2018   CREATININE 0.77 11/16/2016 0522   CALCIUM 8.2 (L) 11/16/2016 0522  PROT 7.7 04/27/2016 1724   ALBUMIN 4.4 04/27/2016 1724   AST 12 (A) 10/14/2018   ALT 12 10/14/2018   ALKPHOS 81 10/14/2018   BILITOT 1.2 04/27/2016 1724   GFRNONAA >60 11/16/2016 0522   GFRAA >60 11/16/2016 0522   Recent Labs    04/18/18 10/14/18  NA 148* 140  K 3.2* 4.3  BUN 14 18  CREATININE 0.7 0.7   Recent Labs    04/18/18 10/14/18  AST 14 12*  ALT 7 12  ALKPHOS 87 81   Recent Labs    04/18/18 10/14/18  WBC 12.9 9.2  HGB 14.8 13.2  HCT 44 39  PLT 175 172   Recent Labs    10/14/18  CHOL 127  LDLCALC 69  TRIG 106   Lab Results  Component Value Date   MICROALBUR 1.9 06/22/2017   Lab Results  Component Value Date   TSH 4.91 10/21/2018   Lab Results  Component Value Date   HGBA1C 6.8 10/14/2018   Lab Results  Component Value Date   CHOL 127 10/14/2018   HDL 37 10/14/2018   LDLCALC 69 10/14/2018   TRIG 106 10/14/2018   CHOLHDL 2 12/19/2012    Significant Diagnostic Results in last 30 days:  No results found.  Assessment and Plan  Essential hypertension, benign Controlled; continue Imdur 15 mg daily, Toprol-XL 12.5 mg daily and Lasix 20 mg daily  Vitamin D deficiency Most recent vitamin D is 14 and patient has been getting vitamin D 50,000 monthly, will change to 50,000 weekly for 24 weeks / 6 months   Fibromyalgia Patient does not appear in physical discomfort; continue tramadol 50 mg twice daily which is also used to treat patient's back pain     Rahi Chandonnet D. [name], MD

## 2019-02-09 ENCOUNTER — Encounter: Payer: Self-pay | Admitting: Internal Medicine

## 2019-02-09 NOTE — Assessment & Plan Note (Signed)
Controlled; continue Imdur 15 mg daily, Toprol-XL 12.5 mg daily and Lasix 20 mg daily

## 2019-02-09 NOTE — Assessment & Plan Note (Signed)
Patient does not appear in physical discomfort; continue tramadol 50 mg twice daily which is also used to treat patient's back pain

## 2019-02-09 NOTE — Assessment & Plan Note (Signed)
Most recent vitamin D is 14 and patient has been getting vitamin D 50,000 monthly, will change to 50,000 weekly for 24 weeks / 6 months

## 2019-02-12 ENCOUNTER — Other Ambulatory Visit: Payer: Self-pay | Admitting: Adult Health

## 2019-02-12 MED ORDER — TRAMADOL HCL 50 MG PO TABS: 50.0000 mg | ORAL_TABLET | Freq: Two times a day (BID) | ORAL | 0 refills | Status: DC

## 2019-03-03 ENCOUNTER — Non-Acute Institutional Stay (SKILLED_NURSING_FACILITY): Payer: Medicare Other | Admitting: Adult Health

## 2019-03-03 ENCOUNTER — Encounter: Payer: Self-pay | Admitting: Adult Health

## 2019-03-03 DIAGNOSIS — I5032 Chronic diastolic (congestive) heart failure: Secondary | ICD-10-CM

## 2019-03-03 DIAGNOSIS — E1159 Type 2 diabetes mellitus with other circulatory complications: Secondary | ICD-10-CM | POA: Diagnosis not present

## 2019-03-03 DIAGNOSIS — I25118 Atherosclerotic heart disease of native coronary artery with other forms of angina pectoris: Secondary | ICD-10-CM

## 2019-03-03 DIAGNOSIS — I10 Essential (primary) hypertension: Secondary | ICD-10-CM

## 2019-03-03 DIAGNOSIS — Z794 Long term (current) use of insulin: Secondary | ICD-10-CM

## 2019-03-03 DIAGNOSIS — I152 Hypertension secondary to endocrine disorders: Secondary | ICD-10-CM

## 2019-03-03 DIAGNOSIS — E1169 Type 2 diabetes mellitus with other specified complication: Secondary | ICD-10-CM

## 2019-03-03 DIAGNOSIS — M797 Fibromyalgia: Secondary | ICD-10-CM

## 2019-03-03 DIAGNOSIS — E039 Hypothyroidism, unspecified: Secondary | ICD-10-CM

## 2019-03-03 DIAGNOSIS — E785 Hyperlipidemia, unspecified: Secondary | ICD-10-CM

## 2019-03-03 DIAGNOSIS — F0150 Vascular dementia without behavioral disturbance: Secondary | ICD-10-CM

## 2019-03-03 DIAGNOSIS — J449 Chronic obstructive pulmonary disease, unspecified: Secondary | ICD-10-CM

## 2019-03-03 NOTE — Progress Notes (Signed)
Location:   [facility] Room Number: 202 Place of Service:  SNF ([id number])   CODE STATUS: DNR  Allergies  Allergen Reactions  . Lisinopril Other (See Comments)    Reaction:  Respiratory arrest   . Spironolactone Anaphylaxis  . Vancomycin Anaphylaxis and Rash  . Donepezil Diarrhea  . Eggs Or Egg-Derived Products Diarrhea  . Erythromycin Other (See Comments)    Reaction:  Makes pt feel weird   . Macrolides And Ketolides Other (See Comments)    Reaction:  Unknown   . Penicillins Itching and Other (See Comments)    Has patient had a PCN reaction causing immediate rash, facial/tongue/throat swelling, SOB or lightheadedness with hypotension: No Has patient had a PCN reaction causing severe rash involving mucus membranes or skin necrosis: No Has patient had a PCN reaction that required hospitalization No Has patient had a PCN reaction occurring within the last 10 years: No If all of the above answers are "NO", then may proceed with Cephalosporin use.  . Quinapril Hcl Itching  . Angiotensin Receptor Blockers Other (See Comments)    Reaction:  Unknown   . Lipitor [Atorvastatin] Palpitations  . Pentazocine Lactate Other (See Comments)    Reaction:  Unknown   . Propoxyphene Hcl Other (See Comments)    Reaction unknown  . Sulfonamide Derivatives Other (See Comments)    Reaction:  Unknown     Chief Complaint  Patient presents with  . Medical Management of Chronic Issues    Atherosclerosis of native coronary artery of native heart with stable angina pectoris; chronic diastolic CHF (congestive heart failure); hypertension associated with type 2 diabetes mellitus.     HPI:  She is a 78 year old long term resident of this facility being seen for the management of her chronic illnesses: cad; chf; hypertension. There are no reports of agitation; no reports of changes in appetite; no reports of uncontrolled pain. No repots of fevers present.   Past Medical History:   Diagnosis Date  . Allergy to ACE inhibitors 03/17/2015  . Anxiety   . CAD (coronary artery disease)    a. s/p inferior STEMI 12/29/10 with rotablator atherectomy RCA 12/31/10 and DES x 2 RCA;  b. Lexiscan Myoview (02/2015): mild reversible apical anterior perfusion defect. C. cath 02/2015 50% LAD lesion with patent stent, Tx Rx    . Cardiomyopathy, ischemic 03/17/2015  . Chronic diastolic CHF (congestive heart failure) ([place]) 11/12/2016  . COPD (chronic obstructive pulmonary disease) ([place])   . Coronary atherosclerosis of native coronary artery 04/03/2013  . Dementia ([place])   . Depression 03/14/2017  . Diabetes mellitus    type 2  . Diverticulosis   . DM2 (diabetes mellitus, type 2) ([place]) 04/02/2013  . Dysphagia 05/10/2017   Minimal 6/20 - pureed diet  . Fibromyalgia 04/20/2008   Qualifier: Diagnosis of  By: Nelson-Smith CMA (AAMA), Dottie    . GERD (gastroesophageal reflux disease)   . Glaucoma   . HTN (hypertension)   . Hyperlipidemia   . Ischemic cardiomyopathy    EF 45%cath 2012 EF55% 5/12; 35% 11/14  . Memory deficit   . Nephrolithiasis   . Obesity (BMI 30-39.9) 10/06/2015  . Osteoarthritis   . Overweight(278.02)   . Respiratory arrest//ACE Inhibitor presumed cause   . Senile dementia without behavioral disturbance ([place]) 10/06/2015  . Sleep apnea   . Small bowel obstruction (HCC)    from a sigmoid stricture  . Ventricular tachycardia ([place]) 03/17/2015  . Vitamin D deficiency  01/28/2017    Past Surgical History:  Procedure Laterality Date  . bilateral tubal ligation    . CARDIAC CATHETERIZATION N/A 03/18/2015   Procedure: LEFT HEART CATH AND CORONARY ANGIOGRAPHY;  Surgeon: Lorretta Harp, MD;  Location: [facility] CATH LAB;  Service: Cardiovascular;  Laterality: N/A;  . CARDIAC SURGERY    . HIP ARTHROPLASTY Right 11/10/2016   Procedure: ARTHROPLASTY BIPOLAR HIP (HEMIARTHROPLASTY);  Surgeon: Marchia Bond, MD;  Location: [facility];  Service: Orthopedics;  Laterality: Right;  . lap sigmoid  colectomy with repair of colovesical fistula  07/31/2008  . LITHOTRIPSY      Social History   Socioeconomic History  . Marital status: Widowed    Spouse name: Not on file  . Number of children: 2  . Years of education: 34  . Highest education level: Not on file  Occupational History  . Occupation: Retired    Comment: Haematologist   Social Needs  . Financial resource strain: Not hard at all  . Food insecurity:    Worry: Never true    Inability: Never true  . Transportation needs:    Medical: No    Non-medical: No  Tobacco Use  . Smoking status: Former Smoker    Packs/day: 1.00    Years: 25.00    Pack years: 25.00    Types: Cigarettes  . Smokeless tobacco: Never Used  . Tobacco comment: over a ppd for 40+ years; quit 10/2004.  unsuccessfully tried eCigs.  Substance and Sexual Activity  . Alcohol use: No    Alcohol/week: 1.0 standard drinks    Types: 1 Standard drinks or equivalent per week  . Drug use: No  . Sexual activity: Never  Lifestyle  . Physical activity:    Days per week: 0 days    Minutes per session: 0 min  . Stress: Only a little  Relationships  . Social connections:    Talks on phone: Twice a week    Gets together: Twice a week    Attends religious service: Never    Active member of club or organization: No    Attends meetings of clubs or organizations: Never    Relationship status: Widowed  . Intimate partner violence:    Fear of current or ex partner: No    Emotionally abused: No    Physically abused: No    Forced sexual activity: No  Other Topics Concern  . Not on file  Social History Narrative   Admitted to [facility] 11/16/16   Patient is widowed with 2 children.   Patient is right handed.   Patient has 11 th grade education.   Former smoker-can't smoke since admitted to [facility]   Alcohol none   DNR   Family History  Problem Relation Age of Onset  . Diabetes Father   . Heart disease Father   .  Heart attack Father   . Stroke Mother   . Breast cancer Maternal Grandmother   . Diabetes Sister   . Hypertension Sister   . Colon cancer Neg Hx       VITAL SIGNS BP 122/68   Pulse 74   Temp 98 F (36.7 C) (Oral)   Resp 18   Ht 5\' 4"  (1.626 m)   Wt 136 lb 9.6 oz (62 kg)   LMP  (LMP Unknown)   BMI 23.45 kg/m   Outpatient Encounter Medications as of 03/03/2019  Medication Sig Note  . acetaminophen (TYLENOL) 325 MG tablet  Take 650 mg by mouth every 6 (six) hours as needed.   Marland Kitchen albuterol (PROVENTIL HFA;VENTOLIN HFA) 108 (90 Base) MCG/ACT inhaler Inhale 2 puffs into the lungs every 6 (six) hours as needed for wheezing or shortness of breath.   Marland Kitchen aspirin EC 81 MG tablet Take 81 mg by mouth daily.   Marland Kitchen atorvastatin (LIPITOR) 40 MG tablet Take 40 mg by mouth daily.    . clopidogrel (PLAVIX) 75 MG tablet Take 75 mg by mouth daily.    . furosemide (LASIX) 20 MG tablet Take 20 mg by mouth daily.    . insulin glargine (LANTUS) 100 UNIT/ML injection Inject 15 Units into the skin at bedtime. If blood sugar is > 150   . insulin lispro (HUMALOG KWIKPEN) 100 UNIT/ML KiwkPen Inject 4 Units into the skin 3 (three) times daily with meals.    . isosorbide mononitrate (IMDUR) 30 MG 24 hr tablet Take 0.5 tablets (15 mg total) by mouth daily.   . memantine (NAMENDA) 10 MG tablet Take 10 mg by mouth 2 (two) times daily.    . metFORMIN (GLUCOPHAGE) 1000 MG tablet Take 1,000 mg by mouth 2 (two) times daily with a meal.    . metoprolol succinate (TOPROL XL) 25 MG 24 hr tablet Take 0.5 tablets (12.5 mg total) by mouth daily.   . mirtazapine (REMERON) 7.5 MG tablet Take 7.5 mg by mouth at bedtime.   . mometasone-formoterol (DULERA) 100-5 MCG/ACT AERO Inhale 2 puffs into the lungs 2 (two) times daily.  10/06/2015: .  . nitroGLYCERIN (NITROSTAT) 0.4 MG SL tablet Place 0.4 mg under the tongue every 5 (five) minutes as needed for chest pain.   Marland Kitchen NUTRITIONAL SUPPLEMENTS PO Magic cup with lunch and dinner meal   .  polyethylene glycol (MIRALAX / GLYCOLAX) packet Take 17 g by mouth daily as needed for mild constipation.   . sennosides-docusate sodium (SENOKOT-S) 8.6-50 MG tablet Take 2 tablets by mouth daily.   . traMADol (ULTRAM) 50 MG tablet Take 1 tablet (50 mg total) by mouth 2 (two) times daily. Back pain   . traZODone (DESYREL) 50 MG tablet Take 25 mg by mouth at bedtime.    . Vitamin D, Ergocalciferol, (DRISDOL) 50000 units CAPS capsule Take 50,000 Units by mouth every 7 (seven) days.    No facility-administered encounter medications on file as of 03/03/2019.      SIGNIFICANT DIAGNOSTIC EXAMS  LABS REVIEWED TODAY:   10-14-18: wbc 9.2; hgb 13.2; hct 39; plt 172; glucose 161; bun 18; creat 0.7; k+ 4.3; na++ 141; liver normal chol 127; ldl 69 trig 106; hdl 37; hgb a1c 6.8 tsh 6.50 10-21-18: tsh 4.91   Review of Systems  Unable to perform ROS: Dementia (unable to participate )   Physical Exam Constitutional:      General: She is not in acute distress.    Appearance: She is well-developed. She is not diaphoretic.  Neck:     Musculoskeletal: Neck supple.     Thyroid: No thyromegaly.  Cardiovascular:     Rate and Rhythm: Normal rate and regular rhythm.     Pulses: Normal pulses.     Heart sounds: Normal heart sounds.  Pulmonary:     Effort: Pulmonary effort is normal. No respiratory distress.     Breath sounds: Normal breath sounds.  Abdominal:     General: Bowel sounds are normal. There is no distension.     Palpations: Abdomen is soft.     Tenderness: There is no abdominal tenderness.  Musculoskeletal:     Right lower leg: No edema.     Left lower leg: No edema.     Comments: Is able to move all extremities   Lymphadenopathy:     Cervical: No cervical adenopathy.  Skin:    General: Skin is warm and dry.  Neurological:     Mental Status: She is alert. Mental status is at baseline.  Psychiatric:        Mood and Affect: Mood normal.       ASSESSMENT/ PLAN:  TODAY:   1.  Atherosclerosis of native coronary artery of native heart with stable angina pectoris: is stable will continue imdur 15 mg daily toprol xl 12.5 mg daily asa 81 mg daily plavix 75 mg daily has prn ntg   2. Chronic diastolic CHF (congestive heart failure): is stable will continue lasix 20 mg daily  toprol xl 12.5 mg daily imdur 15 mg daily   3.  Hypertension associated with type 2 diabetes mellitus: is stable b/p 122/68: will continue toprol xl 12.5 mg daily   4. Dyslipidemia associated with type 2 diabetes mellitus: is stable ldl 69 will continue lipitor 40 mg daily   5. Type 2 diabetes mellitus with other circulatory complication with long term current use of insulin: is stable hgb a1c 6.8; will continue lantus 15 units nightly humalog 4 units with meals metformin 1 gm twice daily   6. COPD: is stable will continue dulera 100-5 mcg 2 puffs twice daily   7. Vascular dementia without behavioral disturbance: is stable weight is 136 pounds; will continue namenda 10 mg twice daily  8. Fibromyalgia: is stable will continue ultram 50 mg twice daily   9. Insomnia: is stable will continue trazodone 25 mg nightly   10. Vitamin D deficiency: is stable will continue vit D 50,000 units weekly     MD is aware of resident's narcotic use and is in agreement with current plan of care. We will attempt to wean resident as apropriate   Ok Edwards NP [facility]  Contact [phone] Monday through Friday 8am- 5pm  After hours call [phone]

## 2019-03-06 DIAGNOSIS — E1159 Type 2 diabetes mellitus with other circulatory complications: Secondary | ICD-10-CM | POA: Insufficient documentation

## 2019-03-06 DIAGNOSIS — I10 Essential (primary) hypertension: Secondary | ICD-10-CM

## 2019-03-06 DIAGNOSIS — E039 Hypothyroidism, unspecified: Secondary | ICD-10-CM | POA: Insufficient documentation

## 2019-03-06 DIAGNOSIS — F0150 Vascular dementia without behavioral disturbance: Secondary | ICD-10-CM | POA: Insufficient documentation

## 2019-03-06 DIAGNOSIS — E785 Hyperlipidemia, unspecified: Secondary | ICD-10-CM

## 2019-03-06 DIAGNOSIS — E1169 Type 2 diabetes mellitus with other specified complication: Secondary | ICD-10-CM | POA: Insufficient documentation

## 2019-03-16 ENCOUNTER — Encounter: Payer: Self-pay | Admitting: Internal Medicine

## 2019-03-19 ENCOUNTER — Other Ambulatory Visit: Payer: Self-pay | Admitting: Internal Medicine

## 2019-03-19 MED ORDER — TRAMADOL HCL 50 MG PO TABS: 50.0000 mg | ORAL_TABLET | Freq: Two times a day (BID) | ORAL | 0 refills | Status: DC

## 2019-03-26 ENCOUNTER — Encounter: Payer: Self-pay | Admitting: Internal Medicine

## 2019-03-26 ENCOUNTER — Non-Acute Institutional Stay (SKILLED_NURSING_FACILITY): Payer: Medicare Other | Admitting: Internal Medicine

## 2019-03-26 DIAGNOSIS — J449 Chronic obstructive pulmonary disease, unspecified: Secondary | ICD-10-CM

## 2019-03-26 DIAGNOSIS — Z794 Long term (current) use of insulin: Secondary | ICD-10-CM

## 2019-03-26 DIAGNOSIS — E1159 Type 2 diabetes mellitus with other circulatory complications: Secondary | ICD-10-CM | POA: Diagnosis not present

## 2019-03-26 DIAGNOSIS — I5032 Chronic diastolic (congestive) heart failure: Secondary | ICD-10-CM

## 2019-03-26 NOTE — Progress Notes (Signed)
Location:  [facility] Room Number: 202W Place of Service:  [phone])  [name]. [name], MD  Patient Care Team: Hennie Duos, MD as PCP - General (Internal Medicine)  Extended Emergency Contact Information Primary Emergency Contact: [name],[name] Address: [address]          [place], [place] [address] [name] of [place] Phone: [phone] Work Phone: [phone] Mobile Phone: [phone] Relation: Daughter Secondary Emergency Contact: [name],[name]  [place] Phone: [phone] Relation: Niece    Allergies: Lisinopril; Spironolactone; Vancomycin; Donepezil; Eggs or egg-derived products; Erythromycin; Macrolides and ketolides; Penicillins; Quinapril hcl; Angiotensin receptor blockers; Lipitor [atorvastatin]; Pentazocine lactate; Propoxyphene hcl; and Sulfonamide derivatives  Chief Complaint  Patient presents with  . Medical Management of Chronic Issues    Routine visit    HPI: Patient is 78 y.o. female who is being seen for routine issues of chronic diastolic congestive heart failure, COPD, and diabetes mellitus type 2.  Past Medical History:  Diagnosis Date  . Allergy to ACE inhibitors 03/17/2015  . Anxiety   . CAD (coronary artery disease)    a. s/p inferior STEMI 12/29/10 with rotablator atherectomy RCA 12/31/10 and DES x 2 RCA;  b. Lexiscan Myoview (02/2015): mild reversible apical anterior perfusion defect. C. cath 02/2015 50% LAD lesion with patent stent, Tx Rx    . Cardiomyopathy, ischemic 03/17/2015  . Chronic diastolic CHF (congestive heart failure) ([place]) 11/12/2016  . COPD (chronic obstructive pulmonary disease) ([place])   . Coronary atherosclerosis of native coronary artery 04/03/2013  . Dementia ([place])   . Depression 03/14/2017  . Diabetes mellitus    type 2  . Diverticulosis   . DM2 (diabetes mellitus, type 2) ([place]) 04/02/2013  . Dysphagia 05/10/2017   Minimal 6/20 - pureed diet  . Fibromyalgia  04/20/2008   Qualifier: Diagnosis of  By: Nelson-Smith CMA (AAMA), Dottie    . GERD (gastroesophageal reflux disease)   . Glaucoma   . HTN (hypertension)   . Hyperlipidemia   . Ischemic cardiomyopathy    EF 45%cath 2012 EF55% 5/12; 35% 11/14  . Memory deficit   . Nephrolithiasis   . Obesity (BMI 30-39.9) 10/06/2015  . Osteoarthritis   . Overweight(278.02)   . Respiratory arrest//ACE Inhibitor presumed cause   . Senile dementia without behavioral disturbance ([place]) 10/06/2015  . Sleep apnea   . Small bowel obstruction (HCC)    from a sigmoid stricture  . Ventricular tachycardia ([place]) 03/17/2015  . Vitamin D deficiency 01/28/2017    Past Surgical History:  Procedure Laterality Date  . bilateral tubal ligation    . CARDIAC CATHETERIZATION N/A 03/18/2015   Procedure: LEFT HEART CATH AND CORONARY ANGIOGRAPHY;  Surgeon: Lorretta Harp, MD;  Location: [facility] CATH LAB;  Service: Cardiovascular;  Laterality: N/A;  . CARDIAC SURGERY    . HIP ARTHROPLASTY Right 11/10/2016   Procedure: ARTHROPLASTY BIPOLAR HIP (HEMIARTHROPLASTY);  Surgeon: Marchia Bond, MD;  Location: [facility];  Service: Orthopedics;  Laterality: Right;  . 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 LITHOTRIPSY      Allergies as of 03/26/2019      Reactions   Lisinopril Other (See Comments)   Reaction:  Respiratory arrest    Spironolactone Anaphylaxis   Vancomycin Anaphylaxis, Rash   Donepezil Diarrhea   Eggs Or Egg-derived Products Diarrhea   Erythromycin Other (See Comments)   Reaction:  Makes pt feel weird    Macrolides And Ketolides Other (  See Comments)   Reaction:  Unknown    Penicillins Itching, Other (See Comments)   Has patient had a PCN reaction causing immediate rash, facial/tongue/throat swelling, SOB or lightheadedness with hypotension: No Has patient had a PCN reaction causing severe rash involving mucus membranes or skin necrosis: No Has patient had a PCN reaction that required  hospitalization No Has patient had a PCN reaction occurring within the last 10 years: No If all of the above answers are "NO", then may proceed with Cephalosporin use.   Quinapril Hcl Itching   Angiotensin Receptor Blockers Other (See Comments)   Reaction:  Unknown    Lipitor [atorvastatin] Palpitations   Pentazocine Lactate Other (See Comments)   Reaction:  Unknown    Propoxyphene Hcl Other (See Comments)   Reaction unknown   Sulfonamide Derivatives Other (See Comments)   Reaction:  Unknown       Medication List       Accurate as of Mar 26, 2019 11:59 PM. If you have any questions, ask your nurse or doctor.        STOP taking these medications   albuterol 108 (90 Base) MCG/ACT inhaler Commonly known as:  VENTOLIN HFA Stopped by:  Inocencio Homes, MD     TAKE these medications   acetaminophen 325 MG tablet Commonly known as:  TYLENOL Take 650 mg by mouth every 6 (six) hours as needed.   aspirin EC 81 MG tablet Take 81 mg by mouth daily.   atorvastatin 40 MG tablet Commonly known as:  LIPITOR Take 40 mg by mouth daily.   clopidogrel 75 MG tablet Commonly known as:  PLAVIX Take 75 mg by mouth daily.   furosemide 20 MG tablet Commonly known as:  LASIX Take 20 mg by mouth daily.   HumaLOG KwikPen 100 UNIT/ML KiwkPen Generic drug:  insulin lispro Inject 4 Units into the skin 3 (three) times daily with meals.   insulin glargine 100 UNIT/ML injection Commonly known as:  LANTUS Inject 15 Units into the skin at bedtime. If blood sugar is > 150   isosorbide mononitrate 30 MG 24 hr tablet Commonly known as:  IMDUR Take 0.5 tablets (15 mg total) by mouth daily.   memantine 10 MG tablet Commonly known as:  NAMENDA Take 10 mg by mouth 2 (two) times daily.   metFORMIN 1000 MG tablet Commonly known as:  GLUCOPHAGE Take 1,000 mg by mouth 2 (two) times daily with a meal.   metoprolol succinate 25 MG 24 hr tablet Commonly known as:  Toprol XL Take 0.5 tablets (12.5 mg  total) by mouth daily.   mirtazapine 7.5 MG tablet Commonly known as:  REMERON Take 7.5 mg by mouth at bedtime.   mometasone-formoterol 100-5 MCG/ACT Aero Commonly known as:  DULERA Inhale 2 puffs into the lungs 2 (two) times daily.   nitroGLYCERIN 0.4 MG SL tablet Commonly known as:  NITROSTAT Place 0.4 mg under the tongue every 5 (five) minutes as needed for chest pain.   NUTRITIONAL SUPPLEMENTS PO Magic cup with lunch and dinner meal   polyethylene glycol 17 g packet Commonly known as:  MIRALAX / GLYCOLAX Take 17 g by mouth daily as needed for mild constipation.   sennosides-docusate sodium 8.6-50 MG tablet Commonly known as:  SENOKOT-S Take 2 tablets by mouth daily.   traMADol 50 MG tablet Commonly known as:  ULTRAM Take 1 tablet (50 mg total) by mouth 2 (two) times daily. Back pain   traZODone 50 MG tablet Commonly known as:  DESYREL Take 25 mg by mouth at bedtime.   Vitamin D (Ergocalciferol) 1.25 MG (50000 UT) Caps capsule Commonly known as:  DRISDOL Take 50,000 Units by mouth every 7 (seven) days.       No orders of the defined types were placed in this encounter.   Immunization History  Administered Date(s) Administered  . Influenza Split 08/20/2014  . Influenza, High Dose Seasonal PF 10/06/2013  . Influenza-Unspecified 10/17/2017  . PPD Test 11/16/2016, 12/05/2016  . Pneumococcal Conjugate-13 10/17/2017  . Pneumococcal Polysaccharide-23 09/24/2009  . Tdap 10/18/2017    Social History   Tobacco Use  . Smoking status: Former Smoker    Packs/day: 1.00    Years: 25.00    Pack years: 25.00    Types: Cigarettes  . Smokeless tobacco: Never Used  . Tobacco comment: over a ppd for 40+ years; quit 10/2004.  unsuccessfully tried eCigs.  Substance Use Topics  . Alcohol use: No    Alcohol/week: 1.0 standard drinks    Types: 1 Standard drinks or equivalent per week    Review of Systems  DATA OBTAINED: from patient-limited; nursing-no acute concerns  GENERAL:  no fevers, fatigue, appetite changes SKIN: No itching, rash HEENT: No complaint RESPIRATORY: No cough, wheezing, SOB CARDIAC: No chest pain, palpitations, lower extremity edema  GI: No abdominal pain, No N/V/D or constipation, No heartburn or reflux  GU: No dysuria, frequency or urgency, or incontinence  MUSCULOSKELETAL: No unrelieved bone/joint pain NEUROLOGIC: No headache, dizziness  PSYCHIATRIC: No overt anxiety or sadness  Vitals:   03/26/19 1101  BP: 132/76  Pulse: 85  Resp: 20  Temp: (!) 97.1 F (36.2 C)   Body mass index is 23.45 kg/m. Physical Exam  GENERAL APPEARANCE: Alert, moderately conversant, No acute distress  SKIN: No diaphoresis rash HEENT: Unremarkable RESPIRATORY: Breathing is even, unlabored. Lung sounds are clear   CARDIOVASCULAR: Heart RRR no murmurs, rubs or gallops. No peripheral edema  GASTROINTESTINAL: Abdomen is soft, non-tender, not distended w/ normal bowel sounds.  GENITOURINARY: Bladder non tender, not distended  MUSCULOSKELETAL: No abnormal joints or musculature NEUROLOGIC: Cranial nerves 2-12 grossly intact. Moves all extremities PSYCHIATRIC: Mood and affect with dementia, no behavioral issues  Patient Active Problem List   Diagnosis Date Noted  . Hypertension associated with type 2 diabetes mellitus ([place]) 03/06/2019  . Dyslipidemia associated with type 2 diabetes mellitus ([place]) 03/06/2019  . Acquired hypothyroidism 03/06/2019  . Vascular dementia without behavioral disturbance ([place]) 03/06/2019  . Insomnia 08/06/2018  . GERD (gastroesophageal reflux disease) 08/04/2017  . Dysphagia 05/10/2017  . Depression 03/14/2017  . Vitamin D deficiency 01/28/2017  . UTI due to extended-spectrum beta lactamase (ESBL) producing Escherichia coli 11/18/2016  . Urinary tract infection due to Proteus 11/18/2016  . Postoperative anemia due to acute blood loss 11/18/2016  . HTN (hypertension) 11/18/2016  . Chronic diastolic CHF (congestive  heart failure) ([place]) 11/12/2016  . Closed right hip fracture ([place]) 11/10/2016  . Senile dementia without behavioral disturbance ([place]) 10/06/2015  . Obesity (BMI 30-39.9) 10/06/2015  . Tobacco use disorder 03/17/2015  . Cardiomyopathy, ischemic 03/17/2015  . Allergy to ACE inhibitors 03/17/2015  . Ventricular tachycardia ([place]) 03/17/2015  . HLD (hyperlipidemia) 12/31/2014  . COPD (chronic obstructive pulmonary disease) ([place]) 12/31/2014  . Chronic cough 09/12/2013  . Memory deficit 04/23/2013  . Essential hypertension, benign 04/03/2013  . Dyslipidemia 04/03/2013  . Coronary atherosclerosis of native coronary artery 04/03/2013  . DM2 (diabetes mellitus, type 2) ([place]) 04/02/2013  . Fibromyalgia 04/20/2008  CMP     Component Value Date/Time   NA 140 10/14/2018   K 4.3 10/14/2018   CL 103 11/16/2016 0522   CO2 31 11/16/2016 0522   GLUCOSE 220 (H) 11/16/2016 0522   BUN 18 10/14/2018   CREATININE 0.7 10/14/2018   CREATININE 0.77 11/16/2016 0522   CALCIUM 8.2 (L) 11/16/2016 0522   PROT 7.7 04/27/2016 1724   ALBUMIN 4.4 04/27/2016 1724   AST 12 (A) 10/14/2018   ALT 12 10/14/2018   ALKPHOS 81 10/14/2018   BILITOT 1.2 04/27/2016 1724   GFRNONAA >60 11/16/2016 0522   GFRAA >60 11/16/2016 0522   Recent Labs    04/18/18 10/14/18  NA 148* 140  K 3.2* 4.3  BUN 14 18  CREATININE 0.7 0.7   Recent Labs    04/18/18 10/14/18  AST 14 12*  ALT 7 12  ALKPHOS 87 81   Recent Labs    04/18/18 10/14/18  WBC 12.9 9.2  HGB 14.8 13.2  HCT 44 39  PLT 175 172   Recent Labs    10/14/18  CHOL 127  LDLCALC 69  TRIG 106   Lab Results  Component Value Date   MICROALBUR 1.9 06/22/2017   Lab Results  Component Value Date   TSH 4.91 10/21/2018   Lab Results  Component Value Date   HGBA1C 6.8 10/14/2018   Lab Results  Component Value Date   CHOL 127 10/14/2018   HDL 37 10/14/2018   LDLCALC 69 10/14/2018   TRIG 106 10/14/2018   CHOLHDL 2 12/19/2012    Significant  Diagnostic Results in last 30 days:  No results found.  Assessment and Plan  Chronic diastolic CHF (congestive heart failure) (HCC) Stable and chronic without exacerbation; continue Lasix 40 mg daily and Toprol-XL 12.5 mg daily  COPD (chronic obstructive pulmonary disease) (HCC) Stable without recent exacerbation; continue Dulera 100-5 2 puffs twice daily  DM2 (diabetes mellitus, type 2) ([place]) Controlled; with a hemoglobin A1c of 6.8; continue Glucophage 1000 mg p.o. twice daily, and regular insulin 4 units with every meal, with the addition of the Lantus 15 units nightly for blood sugar greater than 150; patient is on statin; patient is allergic to ACE inhibitors     Anne D. [name], MD

## 2019-03-29 ENCOUNTER — Encounter: Payer: Self-pay | Admitting: Internal Medicine

## 2019-03-29 NOTE — Assessment & Plan Note (Signed)
Stable and chronic without exacerbation; continue Lasix 40 mg daily and Toprol-XL 12.5 mg daily

## 2019-03-29 NOTE — Assessment & Plan Note (Addendum)
Controlled; with a hemoglobin A1c of 6.8; continue Glucophage 1000 mg p.o. twice daily, and regular insulin 4 units with every meal, with the addition of the Lantus 15 units nightly for blood sugar greater than 150; patient is on statin; patient is allergic to ACE inhibitors

## 2019-03-29 NOTE — Assessment & Plan Note (Signed)
Stable without recent exacerbation; continue Dulera 100-5 2 puffs twice daily

## 2019-04-02 ENCOUNTER — Encounter: Payer: Self-pay | Admitting: Internal Medicine

## 2019-04-02 ENCOUNTER — Non-Acute Institutional Stay (SKILLED_NURSING_FACILITY): Payer: Medicare Other | Admitting: Internal Medicine

## 2019-04-02 DIAGNOSIS — R1111 Vomiting without nausea: Secondary | ICD-10-CM

## 2019-04-03 ENCOUNTER — Encounter: Payer: Self-pay | Admitting: Internal Medicine

## 2019-04-03 LAB — BASIC METABOLIC PANEL
BUN: 28 — AB (ref 4–21)
Creatinine: 0.8 (ref 0.5–1.1)
Glucose: 151
Potassium: 4 (ref 3.4–5.3)
Sodium: 144 (ref 137–147)

## 2019-04-03 LAB — HEPATIC FUNCTION PANEL
ALT: 5 — AB (ref 7–35)
AST: 13 (ref 13–35)
Alkaline Phosphatase: 65 (ref 25–125)
Bilirubin, Total: 0.3

## 2019-04-03 NOTE — Progress Notes (Signed)
Location:  [facility] Room Number: 202-W Place of Service:  SNF (31)  Hennie Duos, MD  Patient Care Team: Hennie Duos, MD as PCP - General (Internal Medicine)  Extended Emergency Contact Information Primary Emergency Contact: [name],[name] Address: [address]          [place], [place] [address] [name] of [place] Phone: [phone] Work Phone: [phone] Mobile Phone: [phone] Relation: Daughter Secondary Emergency Contact: [name],[name]  [place] Phone: [phone] Relation: Niece    Allergies: Lisinopril; Spironolactone; Vancomycin; Donepezil; Eggs or egg-derived products; Erythromycin; Macrolides and ketolides; Penicillins; Quinapril hcl; Angiotensin receptor blockers; Lipitor [atorvastatin]; Pentazocine lactate; Propoxyphene hcl; and Sulfonamide derivatives  Chief Complaint  Patient presents with  . Acute Visit    Patient has had episodes of vomiting.    HPI: Patient is 78 y.o. female who who the DO and asked me to see because patient had vomited.  Patient had lunch at 1230 and at 3 PM she vomited undigested food which was most certainly from her stomach patient did have mild epigastric tenderness there was no blood in the vomitus.  Review of chart shows that patient had a bowel movement this morning but very small and bowel movement the day before but not very large either.  Patient has been off of Protonix since January.  Past Medical History:  Diagnosis Date  . Allergy to ACE inhibitors 03/17/2015  . Anxiety   . CAD (coronary artery disease)    a. s/p inferior STEMI 12/29/10 with rotablator atherectomy RCA 12/31/10 and DES x 2 RCA;  b. Lexiscan Myoview (02/2015): mild reversible apical anterior perfusion defect. C. cath 02/2015 50% LAD lesion with patent stent, Tx Rx    . Cardiomyopathy, ischemic 03/17/2015  . Chronic diastolic CHF (congestive heart failure) ([place]) 11/12/2016  . COPD (chronic  obstructive pulmonary disease) ([place])   . Coronary atherosclerosis of native coronary artery 04/03/2013  . Dementia ([place])   . Depression 03/14/2017  . Diverticulosis   . DM2 (diabetes mellitus, type 2) ([place]) 04/02/2013  . Dysphagia 05/10/2017   Minimal 6/20 - pureed diet  . Fibromyalgia 04/20/2008   Qualifier: Diagnosis of  By: Nelson-Smith CMA (AAMA), Dottie    . GERD (gastroesophageal reflux disease)   . Glaucoma   . HTN (hypertension)   . Hyperlipidemia   . Ischemic cardiomyopathy    EF 45%cath 2012 EF55% 5/12; 35% 11/14  . Memory deficit   . Nephrolithiasis   . Obesity (BMI 30-39.9) 10/06/2015  . Osteoarthritis   . Overweight(278.02)   . Respiratory arrest//ACE Inhibitor presumed cause   . Senile dementia without behavioral disturbance ([facility]) 10/06/2015  . Sleep apnea   . Small bowel obstruction (HCC)    from a sigmoid stricture  . Ventricular tachycardia ([place]) 03/17/2015  . Vitamin D deficiency 01/28/2017    Past Surgical History:  Procedure Laterality Date  . bilateral tubal ligation    . CARDIAC CATHETERIZATION N/A 03/18/2015   Procedure: LEFT HEART CATH AND CORONARY ANGIOGRAPHY;  Surgeon: Lorretta Harp, MD;  [facility] C[facility] LAB;  Service: Cardiovascular;  Laterality: N/A;  . CARDIAC SURGERY    . HIP ARTHROPLASTY Right 11/10/2016   Procedure: ARTHROPLASTY BIPOLAR HIP (HEMIARTHROPLASTY);  Surgeon: Marchia Bond, MD;  Location: [facility];  Service: Orthopedics;  Laterality: Right;  . 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 LITHOTRIPSY      Allergies as of 04/02/2019  Reactions   Lisinopril Other (See Comments)   Reaction:  Respiratory arrest    Spironolactone Anaphylaxis   Vancomycin Anaphylaxis, Rash   Donepezil Diarrhea   Eggs Or Egg-derived Products Diarrhea   Erythromycin Other (See Comments)   Reaction:  Makes pt feel weird    Macrolides And Ketolides Other (See Comments)   Reaction:  Unknown    Penicillins Itching, Other (See  Comments)   Has patient had a PCN reaction causing immediate rash, facial/tongue/throat swelling, SOB or lightheadedness with hypotension: No Has patient had a PCN reaction causing severe rash involving mucus membranes or skin necrosis: No Has patient had a PCN reaction that required hospitalization No Has patient had a PCN reaction occurring within the last 10 years: No If all of the above answers are "NO", then may proceed with Cephalosporin use.   Quinapril Hcl Itching   Angiotensin Receptor Blockers Other (See Comments)   Reaction:  Unknown    Lipitor [atorvastatin] Palpitations   Pentazocine Lactate Other (See Comments)   Reaction:  Unknown    Propoxyphene Hcl Other (See Comments)   Reaction unknown   Sulfonamide Derivatives Other (See Comments)   Reaction:  Unknown       Medication List       Accurate as of Apr 02, 2019 11:59 PM. If you have any questions, ask your nurse or doctor.        STOP taking these medications   Vitamin D (Ergocalciferol) 1.25 MG (50000 UT) Caps capsule Commonly known as:  DRISDOL Stopped by:  Inocencio Homes, MD     TAKE these medications   acetaminophen 325 MG tablet Commonly known as:  TYLENOL Take 650 mg by mouth every 6 (six) hours as needed for mild pain or moderate pain.   aspirin EC 81 MG tablet Take 81 mg by mouth daily.   atorvastatin 40 MG tablet Commonly known as:  LIPITOR Take 40 mg by mouth daily.   clopidogrel 75 MG tablet Commonly known as:  PLAVIX Take 75 mg by mouth daily.   D3-50 1.25 MG (50000 UT) capsule Generic drug:  Cholecalciferol Take 50,000 Units by mouth every 30 (thirty) days.   furosemide 20 MG tablet Commonly known as:  LASIX Take 20 mg by mouth daily.   HumaLOG KwikPen 100 UNIT/ML KiwkPen Generic drug:  insulin lispro Inject 4 Units into the skin 3 (three) times daily with meals.   insulin glargine 100 UNIT/ML injection Commonly known as:  LANTUS Inject 15 Units into the skin at bedtime. If  blood sugar is > 150   isosorbide mononitrate 30 MG 24 hr tablet Commonly known as:  IMDUR Take 0.5 tablets (15 mg total) by mouth daily.   memantine 10 MG tablet Commonly known as:  NAMENDA Take 10 mg by mouth 2 (two) times daily.   metFORMIN 1000 MG tablet Commonly known as:  GLUCOPHAGE Take 1,000 mg by mouth 2 (two) times daily with a meal.   metoprolol succinate 25 MG 24 hr tablet Commonly known as:  Toprol XL Take 0.5 tablets (12.5 mg total) by mouth daily.   mirtazapine 7.5 MG tablet Commonly known as:  REMERON Take 7.5 mg by mouth at bedtime.   mometasone-formoterol 100-5 MCG/ACT Aero Commonly known as:  DULERA Inhale 2 puffs into the lungs 2 (two) times daily.   nitroGLYCERIN 0.4 MG SL tablet Commonly known as:  NITROSTAT Place 0.4 mg under the tongue every 5 (five) minutes as needed for chest pain.   NUTRITIONAL SUPPLEMENTS PO  Take 1 each by mouth 2 (two) times a day. Magic cup with lunch and dinner meal   ondansetron 4 MG tablet Commonly known as:  ZOFRAN Take 4 mg by mouth every 6 (six) hours as needed for nausea or vomiting.   pantoprazole 40 MG tablet Commonly known as:  PROTONIX Take 40 mg by mouth daily.   polyethylene glycol 17 g packet Commonly known as:  MIRALAX / GLYCOLAX Take 17 g by mouth daily as needed for mild constipation.   sennosides-docusate sodium 8.6-50 MG tablet Commonly known as:  SENOKOT-S Take 2 tablets by mouth daily.   traMADol 50 MG tablet Commonly known as:  ULTRAM Take 1 tablet (50 mg total) by mouth 2 (two) times daily. Back pain   traZODone 50 MG tablet Commonly known as:  DESYREL Take 25 mg by mouth at bedtime.       No orders of the defined types were placed in this encounter.   Immunization History  Administered Date(s) Administered  . Influenza Split 08/20/2014  . Influenza, High Dose Seasonal PF 10/06/2013  . Influenza-Unspecified 10/17/2017  . PPD Test 11/16/2016, 12/05/2016  . Pneumococcal  Conjugate-13 10/17/2017  . Pneumococcal Polysaccharide-23 09/24/2009  . Tdap 10/18/2017    Social History   Tobacco Use  . Smoking status: Former Smoker    Packs/day: 1.00    Years: 25.00    Pack years: 25.00    Types: Cigarettes  . Smokeless tobacco: Never Used  . Tobacco comment: over a ppd for 40+ years; quit 10/2004.  unsuccessfully tried eCigs.  Substance Use Topics  . Alcohol use: No    Alcohol/week: 1.0 standard drinks    Types: 1 Standard drinks or equivalent per week    Review of Systems  DATA OBTAINED: from patient-limited; nursing-as of history present illness GENERAL:  no fevers, fatigue, appetite changes SKIN: No itching, rash HEENT: No complaint RESPIRATORY: No cough, wheezing, SOB CARDIAC: No chest pain, palpitations, lower extremity edema  GI: No abdominal pain, No N/V/D or constipation, No heartburn or reflux  GU: No dysuria, frequency or urgency or  incontinence  MUSCULOSKELETAL: No unrelieved bone/joint pain NEUROLOGIC: No headache, dizziness  PSYCHIATRIC: No overt anxiety or sadness  Vitals:   04/02/19 1630  BP: 120/75  Pulse: 80  Resp: 20  Temp: (!) 97 F (36.1 C)  SpO2: 98%   Body mass index is 23.45 kg/m. Physical Exam  GENERAL APPEARANCE: Alert, minimally conversant, No acute distress  SKIN: No diaphoresis rash HEENT: Unremarkable RESPIRATORY: Breathing is even, unlabored. Lung sounds are clear   CARDIOVASCULAR: Heart RRR no murmurs, rubs or gallops. No peripheral edema  GASTROINTESTINAL: Abdomen is soft, mild epigastric tenderness, moderately distended w/ present but decreased bowel sounds.  GENITOURINARY: Bladder non tender, not distended  MUSCULOSKELETAL: No abnormal joints or musculature NEUROLOGIC: Cranial nerves 2-12 grossly intact. Moves all extremities PSYCHIATRIC: Mood and affect with dementia, no behavioral issues  Patient Active Problem List   Diagnosis Date Noted  . Hypertension associated with type 2 diabetes mellitus  ([place]) 03/06/2019  . Dyslipidemia associated with type 2 diabetes mellitus ([place]) 03/06/2019  . Acquired hypothyroidism 03/06/2019  . Vascular dementia without behavioral disturbance ([place]) 03/06/2019  . Insomnia 08/06/2018  . GERD (gastroesophageal reflux disease) 08/04/2017  . Dysphagia 05/10/2017  . Depression 03/14/2017  . Vitamin D deficiency 01/28/2017  . UTI due to extended-spectrum beta lactamase (ESBL) producing Escherichia coli 11/18/2016  . Urinary tract infection due to Proteus 11/18/2016  . Postoperative anemia due to acute blood  loss 11/18/2016  . HTN (hypertension) 11/18/2016  . Chronic diastolic CHF (congestive heart failure) ([place]) 11/12/2016  . Closed right hip fracture ([place]) 11/10/2016  . Senile dementia without behavioral disturbance ([place]) 10/06/2015  . Obesity (BMI 30-39.9) 10/06/2015  . Tobacco use disorder 03/17/2015  . Cardiomyopathy, ischemic 03/17/2015  . Allergy to ACE inhibitors 03/17/2015  . Ventricular tachycardia ([place]) 03/17/2015  . HLD (hyperlipidemia) 12/31/2014  . COPD (chronic obstructive pulmonary disease) ([place]) 12/31/2014  . Chronic cough 09/12/2013  . Memory deficit 04/23/2013  . Essential hypertension, benign 04/03/2013  . Dyslipidemia 04/03/2013  . Coronary atherosclerosis of native coronary artery 04/03/2013  . DM2 (diabetes mellitus, type 2) ([place]) 04/02/2013  . Fibromyalgia 04/20/2008    CMP     Component Value Date/Time   NA 140 10/14/2018   K 4.3 10/14/2018   CL 103 11/16/2016 0522   CO2 31 11/16/2016 0522   GLUCOSE 220 (H) 11/16/2016 0522   BUN 18 10/14/2018   CREATININE 0.7 10/14/2018   CREATININE 0.77 11/16/2016 0522   CALCIUM 8.2 (L) 11/16/2016 0522   PROT 7.7 04/27/2016 1724   ALBUMIN 4.4 04/27/2016 1724   AST 12 (A) 10/14/2018   ALT 12 10/14/2018   ALKPHOS 81 10/14/2018   BILITOT 1.2 04/27/2016 1724   GFRNONAA >60 11/16/2016 0522   GFRAA >60 11/16/2016 0522   Recent Labs    04/18/18 10/14/18  NA 148* 140  K 3.2* 4.3   BUN 14 18  CREATININE 0.7 0.7   Recent Labs    04/18/18 10/14/18  AST 14 12*  ALT 7 12  ALKPHOS 87 81   Recent Labs    04/18/18 10/14/18  WBC 12.9 9.2  HGB 14.8 13.2  HCT 44 39  PLT 175 172   Recent Labs    10/14/18  CHOL 127  LDLCALC 69  TRIG 106   Lab Results  Component Value Date   MICROALBUR 1.9 06/22/2017   Lab Results  Component Value Date   TSH 4.91 10/21/2018   Lab Results  Component Value Date   HGBA1C 6.8 10/14/2018   Lab Results  Component Value Date   CHOL 127 10/14/2018   HDL 37 10/14/2018   LDLCALC 69 10/14/2018   TRIG 106 10/14/2018   CHOLHDL 2 12/19/2012    Significant Diagnostic Results in last 30 days:  No results found.  Assessment and Plan  Episode of vomiting- patient has vomited in the past when she has not had good evacuation of her bowels; obviously will continue to monitor but for the moment will put her on a clear liquid diet for the rest of today tomorrow and the morning of Friday and reevaluate at that time unless some new symptom or sign arises     Noah Delaine. [name], MD

## 2019-04-06 ENCOUNTER — Encounter: Payer: Self-pay | Admitting: Internal Medicine

## 2019-04-22 ENCOUNTER — Encounter: Payer: Self-pay | Admitting: Internal Medicine

## 2019-04-22 ENCOUNTER — Non-Acute Institutional Stay (SKILLED_NURSING_FACILITY): Payer: Medicare Other | Admitting: Internal Medicine

## 2019-04-22 DIAGNOSIS — E1169 Type 2 diabetes mellitus with other specified complication: Secondary | ICD-10-CM

## 2019-04-22 DIAGNOSIS — K219 Gastro-esophageal reflux disease without esophagitis: Secondary | ICD-10-CM

## 2019-04-22 DIAGNOSIS — R1312 Dysphagia, oropharyngeal phase: Secondary | ICD-10-CM

## 2019-04-22 DIAGNOSIS — E785 Hyperlipidemia, unspecified: Secondary | ICD-10-CM

## 2019-04-22 NOTE — Progress Notes (Signed)
Location:  [facility] Room Number: 202/D Place of Service:  SNF (31)  Hennie Duos, MD  Patient Care Team: Hennie Duos, MD as PCP - General (Internal Medicine)  Extended Emergency Contact Information Primary Emergency Contact: [name],[name] Address: [address]          [place], [place] [address] [name] of [place] Phone: [phone] Work Phone: [phone] Mobile Phone: [phone] Relation: Daughter Secondary Emergency Contact: [name],[name]  [place] Phone: [phone] Relation: Niece   Chief Complaint  Patient presents with  . Medical Management of Chronic Issues    Routine visit of medical management  . Quality Metric Gaps    Eye Exam, Foot Exam,Urine Microalbumin, Hemogoblin A1C    HPI:  Pt is a 78 y.o. female seen today for medical management of chronic diseases dysphasia, GERD, and hyperlipidemia.   Past Medical History:  Diagnosis Date  . Allergy to ACE inhibitors 03/17/2015  . Anxiety   . CAD (coronary artery disease)    a. s/p inferior STEMI 12/29/10 with rotablator atherectomy RCA 12/31/10 and DES x 2 RCA;  b. Lexiscan Myoview (02/2015): mild reversible apical anterior perfusion defect. C. cath 02/2015 50% LAD lesion with patent stent, Tx Rx    . Cardiomyopathy, ischemic 03/17/2015  . Chronic diastolic CHF (congestive heart failure) ([place]) 11/12/2016  . COPD (chronic obstructive pulmonary disease) ([place])   . Coronary atherosclerosis of native coronary artery 04/03/2013  . Dementia ([place])   . Depression 03/14/2017  . Diverticulosis   . DM2 (diabetes mellitus, type 2) ([place]) 04/02/2013  . Dysphagia 05/10/2017   Minimal 6/20 - pureed diet  . Fibromyalgia 04/20/2008   Qualifier: Diagnosis of  By: Nelson-Smith CMA (AAMA), Dottie    . GERD (gastroesophageal reflux disease)   . Glaucoma   . HTN (hypertension)   . Hyperlipidemia   . Ischemic cardiomyopathy    EF 45%cath 2012 EF55% 5/12; 35% 11/14   . Memory deficit   . Nephrolithiasis   . Obesity (BMI 30-39.9) 10/06/2015  . Osteoarthritis   . Overweight(278.02)   . Respiratory arrest//ACE Inhibitor presumed cause   . Senile dementia without behavioral disturbance ([place]) 10/06/2015  . Sleep apnea   . Small bowel obstruction (HCC)    from a sigmoid stricture  . Ventricular tachycardia ([place]) 03/17/2015  . Vitamin D deficiency 01/28/2017   Past Surgical History:  Procedure Laterality Date  . bilateral tubal ligation    . CARDIAC CATHETERIZATION N/A 03/18/2015   Procedure: LEFT HEART CATH AND CORONARY ANGIOGRAPHY;  Surgeon: Lorretta Harp, MD;  Location: [facility] CATH LAB;  Service: Cardiovascular;  Laterality: N/A;  . CARDIAC SURGERY    . HIP ARTHROPLASTY Right 11/10/2016   Procedure: ARTHROPLASTY BIPOLAR HIP (HEMIARTHROPLASTY);  Surgeon: Marchia Bond, MD;  Location: [facility];  Service: Orthopedics;  Laterality: Right;  . 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 LITHOTRIPSY      Allergies  Allergen Reactions  . Lisinopril Other (See Comments)    Reaction:  Respiratory arrest   . Spironolactone Anaphylaxis  . Vancomycin Anaphylaxis and Rash  . Donepezil Diarrhea  . Eggs Or Egg-Derived Products Diarrhea  . Erythromycin Other (See Comments)    Reaction:  Makes pt feel weird   . Macrolides And Ketolides Other (See Comments)    Reaction:  Unknown   . Penicillins Itching and Other (See Comments)    Has patient had a PCN reaction causing immediate rash, facial/tongue/throat  swelling, SOB or lightheadedness with hypotension: No Has patient had a PCN reaction causing severe rash involving mucus membranes or skin necrosis: No Has patient had a PCN reaction that required hospitalization No Has patient had a PCN reaction occurring within the last 10 years: No If all of the above answers are "NO", then may proceed with Cephalosporin use.  . Quinapril Hcl Itching  . Angiotensin Receptor Blockers Other (See  Comments)    Reaction:  Unknown   . Lipitor [Atorvastatin] Palpitations  . Pentazocine Lactate Other (See Comments)    Reaction:  Unknown   . Propoxyphene Hcl Other (See Comments)    Reaction unknown  . Sulfonamide Derivatives Other (See Comments)    Reaction:  Unknown     Allergies as of 04/22/2019      Reactions   Lisinopril Other (See Comments)   Reaction:  Respiratory arrest    Spironolactone Anaphylaxis   Vancomycin Anaphylaxis, Rash   Donepezil Diarrhea   Eggs Or Egg-derived Products Diarrhea   Erythromycin Other (See Comments)   Reaction:  Makes pt feel weird    Macrolides And Ketolides Other (See Comments)   Reaction:  Unknown    Penicillins Itching, Other (See Comments)   Has patient had a PCN reaction causing immediate rash, facial/tongue/throat swelling, SOB or lightheadedness with hypotension: No Has patient had a PCN reaction causing severe rash involving mucus membranes or skin necrosis: No Has patient had a PCN reaction that required hospitalization No Has patient had a PCN reaction occurring within the last 10 years: No If all of the above answers are "NO", then may proceed with Cephalosporin use.   Quinapril Hcl Itching   Angiotensin Receptor Blockers Other (See Comments)   Reaction:  Unknown    Lipitor [atorvastatin] Palpitations   Pentazocine Lactate Other (See Comments)   Reaction:  Unknown    Propoxyphene Hcl Other (See Comments)   Reaction unknown   Sulfonamide Derivatives Other (See Comments)   Reaction:  Unknown       Medication List       Accurate as of April 22, 2019 11:59 PM. If you have any questions, ask your nurse or doctor.        acetaminophen 325 MG tablet Commonly known as:  TYLENOL Take 650 mg by mouth every 6 (six) hours as needed for mild pain or moderate pain.   aspirin EC 81 MG tablet Take 81 mg by mouth daily.   atorvastatin 40 MG tablet Commonly known as:  LIPITOR Take 40 mg by mouth daily.   clopidogrel 75 MG tablet  Commonly known as:  PLAVIX Take 75 mg by mouth daily.   D3-50 1.25 MG (50000 UT) capsule Generic drug:  Cholecalciferol Take 50,000 Units by mouth every 30 (thirty) days.   furosemide 20 MG tablet Commonly known as:  LASIX Take 20 mg by mouth daily.   HumaLOG KwikPen 100 UNIT/ML KiwkPen Generic drug:  insulin lispro Inject 4 Units into the skin 3 (three) times daily with meals.   insulin glargine 100 UNIT/ML injection Commonly known as:  LANTUS Inject 15 Units into the skin at bedtime. If blood sugar is > 150   isosorbide mononitrate 30 MG 24 hr tablet Commonly known as:  IMDUR Take 0.5 tablets (15 mg total) by mouth daily.   memantine 10 MG tablet Commonly known as:  NAMENDA Take 10 mg by mouth 2 (two) times daily.   metFORMIN 1000 MG tablet Commonly known as:  GLUCOPHAGE Take 1,000 mg by mouth  2 (two) times daily with a meal.   metoprolol succinate 25 MG 24 hr tablet Commonly known as:  Toprol XL Take 0.5 tablets (12.5 mg total) by mouth daily.   mirtazapine 7.5 MG tablet Commonly known as:  REMERON Take 7.5 mg by mouth at bedtime.   mometasone-formoterol 100-5 MCG/ACT Aero Commonly known as:  DULERA Inhale 2 puffs into the lungs 2 (two) times daily.   nitroGLYCERIN 0.4 MG SL tablet Commonly known as:  NITROSTAT Place 0.4 mg under the tongue every 5 (five) minutes as needed for chest pain.   NUTRITIONAL SUPPLEMENTS PO Take 1 each by mouth 2 (two) times a day. Magic cup with lunch and dinner meal   ondansetron 4 MG tablet Commonly known as:  ZOFRAN Take 4 mg by mouth every 6 (six) hours as needed for nausea or vomiting.   pantoprazole 40 MG tablet Commonly known as:  PROTONIX Take 40 mg by mouth daily.   polyethylene glycol 17 g packet Commonly known as:  MIRALAX / GLYCOLAX Take 17 g by mouth daily as needed for mild constipation.   sennosides-docusate sodium 8.6-50 MG tablet Commonly known as:  SENOKOT-S Take 2 tablets by mouth daily.   traMADol 50  MG tablet Commonly known as:  ULTRAM Take 1 tablet (50 mg total) by mouth 2 (two) times daily. Back pain   traZODone 50 MG tablet Commonly known as:  DESYREL Take 25 mg by mouth at bedtime.       Review of Systems  DATA OBTAINED: from patient-limited; nursing-no acute concerns GENERAL:  no fevers, fatigue, appetite changes SKIN: No itching, rash HEENT: No complaint RESPIRATORY: No cough, wheezing, SOB CARDIAC: No chest pain, palpitations, lower extremity edema  GI: No abdominal pain, No N/V/D or constipation, No heartburn or reflux  GU: No dysuria, frequency or urgency, or incontinence  MUSCULOSKELETAL: No unrelieved bone/joint pain NEUROLOGIC: No headache, dizziness  PSYCHIATRIC: No overt anxiety or sadness   Immunization History  Administered Date(s) Administered  . Influenza Split 08/20/2014  . Influenza, High Dose Seasonal PF 10/06/2013  . Influenza-Unspecified 10/17/2017  . PPD Test 11/16/2016, 12/05/2016  . Pneumococcal Conjugate-13 10/17/2017  . Pneumococcal Polysaccharide-23 09/24/2009  . Tdap 10/18/2017   Pertinent  Health Maintenance Due  Topic Date Due  . HEMOGLOBIN A1C  04/14/2019  . URINE MICROALBUMIN  04/26/2019 (Originally 06/22/2018)  . FOOT EXAM  07/09/2019 (Originally 11/03/1951)  . OPHTHALMOLOGY EXAM  09/08/2020 (Originally 11/03/1951)  . INFLUENZA VACCINE  06/21/2019  . DEXA SCAN  Completed  . PNA vac Low Risk Adult  Completed   Fall Risk  04/19/2018  Falls in the past year? No    Vitals:   04/22/19 1411  BP: 122/75  Pulse: 80  Resp: 18  Temp: 98 F (36.7 C)  TempSrc: Oral  Weight: 131 lb 6.4 oz (59.6 kg)  Height: 5\' 4"  (1.626 m)   Body mass index is 22.55 kg/m.  Physical Exam  GENERAL APPEARANCE: Alert, minimally conversant, No acute distress  SKIN: No diaphoresis rash HEENT: Unremarkable RESPIRATORY: Breathing is even, unlabored. Lung sounds are clear   CARDIOVASCULAR: Heart RRR no murmurs, rubs or gallops. No peripheral edema   GASTROINTESTINAL: Abdomen is soft, non-tender, not distended w/ normal bowel sounds.  GENITOURINARY: Bladder non tender, not distended  MUSCULOSKELETAL: No abnormal joints or musculature NEUROLOGIC: Cranial nerves 2-12 grossly intact. Moves all extremities PSYCHIATRIC: Mood and affect dementia is, no behavioral issues  Labs reviewed: Recent Labs    10/14/18  NA 140  K  4.3  BUN 18  CREATININE 0.7   Recent Labs    10/14/18  AST 12*  ALT 12  ALKPHOS 81   Recent Labs    10/14/18  WBC 9.2  HGB 13.2  HCT 39  PLT 172   Recent Labs    10/14/18  CHOL 127  LDLCALC 69  TRIG 106   Lab Results  Component Value Date   MICROALBUR 1.9 06/22/2017   Lab Results  Component Value Date   TSH 4.91 10/21/2018   Lab Results  Component Value Date   HGBA1C 6.8 10/14/2018   Lab Results  Component Value Date   CHOL 127 10/14/2018   HDL 37 10/14/2018   LDLCALC 69 10/14/2018   TRIG 106 10/14/2018   CHOLHDL 2 12/19/2012    Significant Diagnostic Results in last 30 days:  No results found.  Assessment/Plan Dysphagia     GERD (gastroesophageal reflux disease) No reports of aspiration reflux; continue Protonix 40 mg daily  Dyslipidemia associated with type 2 diabetes mellitus (HCC) LDL 69 HDL 37 good control; continue Lipitor 40 mg daily 80 units     Lotoya Casella D. Ajooni Karam MD. This is

## 2019-04-26 ENCOUNTER — Encounter: Payer: Self-pay | Admitting: Internal Medicine

## 2019-04-26 NOTE — Assessment & Plan Note (Signed)
LDL 69 HDL 37 good control; continue Lipitor 40 mg daily 80 units

## 2019-04-26 NOTE — Assessment & Plan Note (Signed)
No reports of aspiration reflux; continue Protonix 40 mg daily

## 2019-05-01 LAB — MICROALBUMIN, URINE: Microalb, Ur: 1.2

## 2019-05-22 ENCOUNTER — Non-Acute Institutional Stay (SKILLED_NURSING_FACILITY): Payer: Medicare Other | Admitting: Internal Medicine

## 2019-05-22 ENCOUNTER — Encounter: Payer: Self-pay | Admitting: Internal Medicine

## 2019-05-22 ENCOUNTER — Other Ambulatory Visit: Payer: Self-pay | Admitting: Internal Medicine

## 2019-05-22 DIAGNOSIS — F0390 Unspecified dementia without behavioral disturbance: Secondary | ICD-10-CM

## 2019-05-22 DIAGNOSIS — E1159 Type 2 diabetes mellitus with other circulatory complications: Secondary | ICD-10-CM

## 2019-05-22 DIAGNOSIS — I10 Essential (primary) hypertension: Secondary | ICD-10-CM

## 2019-05-22 DIAGNOSIS — I25118 Atherosclerotic heart disease of native coronary artery with other forms of angina pectoris: Secondary | ICD-10-CM | POA: Diagnosis not present

## 2019-05-22 DIAGNOSIS — I152 Hypertension secondary to endocrine disorders: Secondary | ICD-10-CM

## 2019-05-22 MED ORDER — TRAMADOL HCL 50 MG PO TABS: 50.0000 mg | ORAL_TABLET | Freq: Two times a day (BID) | ORAL | 0 refills | Status: DC

## 2019-05-22 NOTE — Progress Notes (Signed)
Location:  [facility] Room Number: 202-W Place of Service:  SNF (31)  Kathleen Duos, MD  Patient Care Team: Kathleen Duos, MD as PCP - General (Internal Medicine)  Extended Emergency Contact Information Primary Emergency Contact: [name] Address: [address]          [place], [place] [address] [name] of [place] Phone: [phone] Work Phone: [phone] Mobile Phone: [phone] Relation: Daughter Secondary Emergency Contact: [name],Kathleen  [place] Phone: [phone] Relation: Niece    Allergies: Lisinopril, Spironolactone, Vancomycin, Donepezil, Eggs or egg-derived products, Erythromycin, Macrolides and ketolides, Penicillins, Quinapril hcl, Angiotensin receptor blockers, Lipitor [atorvastatin], Pentazocine lactate, Propoxyphene hcl, and Sulfonamide derivatives  Chief Complaint  Patient presents with  . Medical Management of Chronic Issues    Routine [facility] visit  . Quality Metric Gaps    Hgb A1c and urine microalbumin    HPI: Patient is a 78 y.o. female who is being seen for routine issues of hypertension, dementia, and coronary artery disease.  Past Medical History:  Diagnosis Date  . Allergy to ACE inhibitors 03/17/2015  . Anxiety   . CAD (coronary artery disease)    a. s/p inferior STEMI 12/29/10 with rotablator atherectomy RCA 12/31/10 and DES x 2 RCA;  b. Lexiscan Myoview (02/2015): mild reversible apical anterior perfusion defect. C. cath 02/2015 50% LAD lesion with patent stent, Tx Rx    . Cardiomyopathy, ischemic 03/17/2015  . Chronic diastolic CHF (congestive heart failure) ([place]) 11/12/2016  . COPD (chronic obstructive pulmonary disease) ([place])   . Coronary atherosclerosis of native coronary artery 04/03/2013  . Dementia ([place])   . Depression 03/14/2017  . Diverticulosis   . DM2 (diabetes mellitus, type 2) ([place]) 04/02/2013  . Dysphagia 05/10/2017   Minimal 6/20 - pureed diet  .  Fibromyalgia 04/20/2008   Qualifier: Diagnosis of  By: Nelson-Smith CMA (AAMA), Dottie    . GERD (gastroesophageal reflux disease)   . Glaucoma   . HTN (hypertension)   . Hyperlipidemia   . Ischemic cardiomyopathy    EF 45%cath 2012 EF55% 5/12; 35% 11/14  . Memory deficit   . Nephrolithiasis   . Obesity (BMI 30-39.9) 10/06/2015  . Osteoarthritis   . Overweight(278.02)   . Respiratory arrest//ACE Inhibitor presumed cause   . Senile dementia without behavioral disturbance ([place]) 10/06/2015  . Sleep apnea   . Small bowel obstruction (HCC)    from a sigmoid stricture  . Ventricular tachycardia ([place]) 03/17/2015  . Vitamin D deficiency 01/28/2017    Past Surgical History:  Procedure Laterality Date  . bilateral tubal ligation    . CARDIAC CATHETERIZATION N/A 03/18/2015   Procedure: LEFT HEART CATH AND CORONARY ANGIOGRAPHY;  Surgeon: Lorretta Harp, MD;  Location: [facility] CATH LAB;  Service: Cardiovascular;  Laterality: N/A;  . CARDIAC SURGERY    . HIP ARTHROPLASTY Right 11/10/2016   Procedure: ARTHROPLASTY BIPOLAR HIP (HEMIARTHROPLASTY);  Surgeon: Marchia Bond, MD;  Location: [facility];  Service: Orthopedics;  Laterality: Right;  . 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 LITHOTRIPSY      Allergies as of 05/22/2019      Reactions   Lisinopril Other (See Comments)   Reaction:  Respiratory arrest    Spironolactone Anaphylaxis   Vancomycin Anaphylaxis, Rash   Donepezil Diarrhea   Eggs Or Egg-derived Products Diarrhea   Erythromycin Other (See Comments)   Reaction:  Makes pt feel weird    Macrolides And  Ketolides Other (See Comments)   Reaction:  Unknown    Penicillins Itching, Other (See Comments)   Has patient had a PCN reaction causing immediate rash, facial/tongue/throat swelling, SOB or lightheadedness with hypotension: No Has patient had a PCN reaction causing severe rash involving mucus membranes or skin necrosis: No Has patient had a PCN reaction that  required hospitalization No Has patient had a PCN reaction occurring within the last 10 years: No If all of the above answers are "NO", then may proceed with Cephalosporin use.   Quinapril Hcl Itching   Angiotensin Receptor Blockers Other (See Comments)   Reaction:  Unknown    Lipitor [atorvastatin] Palpitations   Pentazocine Lactate Other (See Comments)   Reaction:  Unknown    Propoxyphene Hcl Other (See Comments)   Reaction unknown   Sulfonamide Derivatives Other (See Comments)   Reaction:  Unknown       Medication List       Accurate as of May 22, 2019 11:59 PM. If you have any questions, ask your nurse or doctor.        acetaminophen 325 MG tablet Commonly known as: TYLENOL Take 650 mg by mouth every 6 (six) hours as needed for mild pain or moderate pain.   aspirin EC 81 MG tablet Take 81 mg by mouth daily.   atorvastatin 40 MG tablet Commonly known as: LIPITOR Take 40 mg by mouth daily.   clopidogrel 75 MG tablet Commonly known as: PLAVIX Take 75 mg by mouth daily.   D3-50 1.25 MG (50000 UT) capsule Generic drug: Cholecalciferol Take 50,000 Units by mouth every 30 (thirty) days.   furosemide 20 MG tablet Commonly known as: LASIX Take 20 mg by mouth daily.   HumaLOG KwikPen 100 UNIT/ML KiwkPen Generic drug: insulin lispro Inject 4 Units into the skin 3 (three) times daily with meals.   insulin glargine 100 UNIT/ML injection Commonly known as: LANTUS Inject 15 Units into the skin at bedtime. If blood sugar is > 150   isosorbide mononitrate 30 MG 24 hr tablet Commonly known as: IMDUR Take 0.5 tablets (15 mg total) by mouth daily.   memantine 10 MG tablet Commonly known as: NAMENDA Take 10 mg by mouth 2 (two) times daily.   metFORMIN 1000 MG tablet Commonly known as: GLUCOPHAGE Take 1,000 mg by mouth 2 (two) times daily with a meal.   metoprolol succinate 25 MG 24 hr tablet Commonly known as: Toprol XL Take 0.5 tablets (12.5 mg total) by mouth  daily.   mirtazapine 7.5 MG tablet Commonly known as: REMERON Take 7.5 mg by mouth at bedtime.   mometasone-formoterol 100-5 MCG/ACT Aero Commonly known as: DULERA Inhale 2 puffs into the lungs 2 (two) times daily.   nitroGLYCERIN 0.4 MG SL tablet Commonly known as: NITROSTAT Place 0.4 mg under the tongue every 5 (five) minutes as needed for chest pain.   NUTRITIONAL SUPPLEMENTS PO Take 1 each by mouth 2 (two) times a day. Magic cup with lunch and dinner meal   ondansetron 4 MG tablet Commonly known as: ZOFRAN Take 4 mg by mouth every 6 (six) hours as needed for nausea or vomiting.   pantoprazole 40 MG tablet Commonly known as: PROTONIX Take 40 mg by mouth daily.   polyethylene glycol 17 g packet Commonly known as: MIRALAX / GLYCOLAX Take 17 g by mouth daily as needed for mild constipation.   sennosides-docusate sodium 8.6-50 MG tablet Commonly known as: SENOKOT-S Take 2 tablets by mouth daily.   traMADol  50 MG tablet Commonly known as: ULTRAM Take 1 tablet (50 mg total) by mouth 2 (two) times daily. Back pain   traZODone 50 MG tablet Commonly known as: DESYREL Take 25 mg by mouth at bedtime.       No orders of the defined types were placed in this encounter.   Immunization History  Administered Date(s) Administered  . Influenza Split 08/20/2014  . Influenza, High Dose Seasonal PF 10/06/2013  . Influenza-Unspecified 10/17/2017, 08/29/2018  . PPD Test 11/16/2016, 12/05/2016  . Pneumococcal Conjugate-13 10/17/2017  . Pneumococcal Polysaccharide-23 09/24/2009  . Tdap 10/18/2017    Social History   Tobacco Use  . Smoking status: Former Smoker    Packs/day: 1.00    Years: 25.00    Pack years: 25.00    Types: Cigarettes  . Smokeless tobacco: Never Used  . Tobacco comment: over a ppd for 40+ years; quit 10/2004.  unsuccessfully tried eCigs.  Substance Use Topics  . Alcohol use: No    Alcohol/week: 1.0 standard drinks    Types: 1 Standard drinks or  equivalent per week    Review of Systems  DATA OBTAINED: from patient-limited; nursing- no acute concerns GENERAL:  no fevers, fatigue, appetite changes SKIN: No itching, rash HEENT: No complaint RESPIRATORY: No cough, wheezing, SOB CARDIAC: No chest pain, palpitations, lower extremity edema  GI: No abdominal pain, No N/V/D or constipation, No heartburn or reflux  GU: No dysuria, frequency or urgency, or incontinence  MUSCULOSKELETAL: No unrelieved bone/joint pain NEUROLOGIC: No headache, dizziness  PSYCHIATRIC: No overt anxiety or sadness  Vitals:   05/22/19 0921  BP: 130/74  Pulse: 80  Resp: 20  Temp: (!) 97.2 F (36.2 C)   Body mass index is 22.73 kg/m. Physical Exam  GENERAL APPEARANCE: Alert, minimally conversant, No acute distress  SKIN: No diaphoresis rash HEENT: Unremarkable RESPIRATORY: Breathing is even, unlabored. Lung sounds are clear   CARDIOVASCULAR: Heart RRR no murmurs, rubs or gallops. No peripheral edema  GASTROINTESTINAL: Abdomen is soft, non-tender, not distended w/ normal bowel sounds.  GENITOURINARY: Bladder non tender, not distended  MUSCULOSKELETAL: No abnormal joints or musculature NEUROLOGIC: Cranial nerves 2-12 grossly intact. Moves all extremities PSYCHIATRIC: Mood and affect with dementia, no behavioral issues  Patient Active Problem List   Diagnosis Date Noted  . Hypertension associated with type 2 diabetes mellitus ([place]) 03/06/2019  . Dyslipidemia associated with type 2 diabetes mellitus ([place]) 03/06/2019  . Acquired hypothyroidism 03/06/2019  . Vascular dementia without behavioral disturbance ([place]) 03/06/2019  . Insomnia 08/06/2018  . GERD (gastroesophageal reflux disease) 08/04/2017  . Dysphagia 05/10/2017  . Depression 03/14/2017  . Vitamin D deficiency 01/28/2017  . UTI due to extended-spectrum beta lactamase (ESBL) producing Escherichia coli 11/18/2016  . Urinary tract infection due to Proteus 11/18/2016  . Postoperative anemia  due to acute blood loss 11/18/2016  . HTN (hypertension) 11/18/2016  . Chronic diastolic CHF (congestive heart failure) ([place]) 11/12/2016  . Closed right hip fracture ([place]) 11/10/2016  . Senile dementia without behavioral disturbance ([place]) 10/06/2015  . Obesity (BMI 30-39.9) 10/06/2015  . Tobacco use disorder 03/17/2015  . Cardiomyopathy, ischemic 03/17/2015  . Allergy to ACE inhibitors 03/17/2015  . Ventricular tachycardia ([place]) 03/17/2015  . HLD (hyperlipidemia) 12/31/2014  . COPD (chronic obstructive pulmonary disease) ([place]) 12/31/2014  . Chronic cough 09/12/2013  . Memory deficit 04/23/2013  . Essential hypertension, benign 04/03/2013  . Dyslipidemia 04/03/2013  . Coronary atherosclerosis of native coronary artery 04/03/2013  . DM2 (diabetes mellitus, type 2) ([place]) 04/02/2013  .  Fibromyalgia 04/20/2008    CMP     Component Value Date/Time   NA 140 10/14/2018   K 4.3 10/14/2018   CL 103 11/16/2016 0522   CO2 31 11/16/2016 0522   GLUCOSE 220 (H) 11/16/2016 0522   BUN 18 10/14/2018   CREATININE 0.7 10/14/2018   CREATININE 0.77 11/16/2016 0522   CALCIUM 8.2 (L) 11/16/2016 0522   PROT 7.7 04/27/2016 1724   ALBUMIN 4.4 04/27/2016 1724   AST 12 (A) 10/14/2018   ALT 12 10/14/2018   ALKPHOS 81 10/14/2018   BILITOT 1.2 04/27/2016 1724   GFRNONAA >60 11/16/2016 0522   GFRAA >60 11/16/2016 0522   Recent Labs    10/14/18  NA 140  K 4.3  BUN 18  CREATININE 0.7   Recent Labs    10/14/18  AST 12*  ALT 12  ALKPHOS 81   Recent Labs    10/14/18  WBC 9.2  HGB 13.2  HCT 39  PLT 172   Recent Labs    10/14/18  CHOL 127  LDLCALC 69  TRIG 106   Lab Results  Component Value Date   MICROALBUR 1.9 06/22/2017   Lab Results  Component Value Date   TSH 4.91 10/21/2018   Lab Results  Component Value Date   HGBA1C 6.8 10/14/2018   Lab Results  Component Value Date   CHOL 127 10/14/2018   HDL 37 10/14/2018   LDLCALC 69 10/14/2018   TRIG 106 10/14/2018   CHOLHDL  2 12/19/2012    Significant Diagnostic Results in last 30 days:  No results found.  Assessment and Plan  Hypertension associated with type 2 diabetes mellitus ([place]) Controlled; continue Toprol-XL 12.5 mg daily, Lasix 20 mg daily and Imdur 15 mg daily  Senile dementia without behavioral disturbance Very very slow decline; continue Namenda 10 mg twice daily  Coronary atherosclerosis of native coronary artery No chest pain or equivalent; continue Toprol-XL 12.5 mg daily, Imdur 15 mg daily, ASA 81 mg daily, Plavix 75 mg daily, and as needed nitroglycerin sublingual; patient is on statin     Kathleen Duos, MD

## 2019-05-24 ENCOUNTER — Encounter: Payer: Self-pay | Admitting: Internal Medicine

## 2019-05-24 NOTE — Assessment & Plan Note (Signed)
No chest pain or equivalent; continue Toprol-XL 12.5 mg daily, Imdur 15 mg daily, ASA 81 mg daily, Plavix 75 mg daily, and as needed nitroglycerin sublingual; patient is on statin

## 2019-05-24 NOTE — Assessment & Plan Note (Signed)
Very very slow decline; continue Namenda 10 mg twice daily

## 2019-05-24 NOTE — Assessment & Plan Note (Signed)
Controlled; continue Toprol-XL 12.5 mg daily, Lasix 20 mg daily and Imdur 15 mg daily

## 2019-06-23 ENCOUNTER — Encounter: Payer: Self-pay | Admitting: Internal Medicine

## 2019-06-23 ENCOUNTER — Non-Acute Institutional Stay (SKILLED_NURSING_FACILITY): Payer: Medicare Other | Admitting: Internal Medicine

## 2019-06-23 DIAGNOSIS — F0150 Vascular dementia without behavioral disturbance: Secondary | ICD-10-CM | POA: Diagnosis not present

## 2019-06-23 DIAGNOSIS — E785 Hyperlipidemia, unspecified: Secondary | ICD-10-CM

## 2019-06-23 DIAGNOSIS — E1159 Type 2 diabetes mellitus with other circulatory complications: Secondary | ICD-10-CM

## 2019-06-23 DIAGNOSIS — Z794 Long term (current) use of insulin: Secondary | ICD-10-CM

## 2019-06-23 DIAGNOSIS — I10 Essential (primary) hypertension: Secondary | ICD-10-CM

## 2019-06-23 DIAGNOSIS — I5032 Chronic diastolic (congestive) heart failure: Secondary | ICD-10-CM

## 2019-06-23 NOTE — Progress Notes (Signed)
Location:  [facility] Room Number: 202-W Place of Service:  SNF [phone]) Provider:  Granville Lewis, PA-C  Hennie Duos, MD  Patient Care Team: Hennie Duos, MD as PCP - General (Internal Medicine)  Extended Emergency Contact Information Primary Emergency Contact: [name],[name] Address: [address]          [place], [place] [address] [name] of [place] Phone: [phone] Work Phone: [phone] Mobile Phone: [phone] Relation: Daughter Secondary Emergency Contact: [name],[name]  [place] Phone: [phone] Relation: Niece  Code Status:  DNR Goals of care: Advanced Directive information Advanced Directives 04/22/2019  Does Patient Have a Medical Advance Directive? Yes  Type of Advance Directive Out of facility DNR (pink MOST or yellow form)  Does patient want to make changes to medical advance directive? No - Patient declined  Copy of Center in Chart? -  Would patient like information on creating a medical advance directive? -  Pre-existing out of facility DNR order (yellow form or pink MOST form) -     Chief Complaint  Patient presents with  . Medical Management of Chronic Issues    Routine [facility] visit  Medical management of chronic medical issues including dementia- hypertension-coronary artery disease-type 2 diabetes-COPD-diastolic CHF- GERD-coronary artery disease  HPI:  Pt is a 78 y.o. female seen today for medical management of chronic diseases as noted above.  Nursing is not reported any recent acute issues patient does have a history of significant progressive dementia poor historian but when I asked her today she did not have any complaints appear to be resting in bed comfortably.  It appears she has had some weight loss since April about 10 pounds-she has been started on Remeron she is also on med Pass supplementation now.  In regards to type 2 diabetes this  appears to be under good control with blood sugars largely running in the lower mid 100s she is on Lantus 15 units at night if CBG is greater than 150 she is also on Humalog 4 units with meals as well as Glucophage thousand milligrams twice daily- most recent hemoglobin A1c was 6.8.  In regards to hypertension this appears to be stable she is on Toprol-XL 12.5 mg a day as well as Lasix 20 mg a day and Imdur 15 mg a day.  Recent blood pressures 134/80-121/81-120/73.  Regards to coronary artery disease she is been essentially asymptomatic it appears recently she is on Toprol as noted above and Imdur as noted above she is also on aspirin 81 mg a day Plavix 75 mg a day and has PRN nitro if needed.  .     Past Medical History:  Diagnosis Date  . Allergy to ACE inhibitors 03/17/2015  . Anxiety   . CAD (coronary artery disease)    a. s/p inferior STEMI 12/29/10 with rotablator atherectomy RCA 12/31/10 and DES x 2 RCA;  b. Lexiscan Myoview (02/2015): mild reversible apical anterior perfusion defect. C. cath 02/2015 50% LAD lesion with patent stent, Tx Rx    . Cardiomyopathy, ischemic 03/17/2015  . Chronic diastolic CHF (congestive heart failure) ([place]) 11/12/2016  . COPD (chronic obstructive pulmonary disease) ([place])   . Coronary atherosclerosis of native coronary artery 04/03/2013  . Dementia ([place])   . Depression 03/14/2017  . Diverticulosis   . DM2 (diabetes mellitus, type 2) ([facility]) 04/02/2013  . Dysphagia 05/10/2017   Minimal 6/20 - pureed diet  . Fibromyalgia 04/20/2008  Qualifier: Diagnosis of  By: Nelson-Smith CMA (AAMA), Dottie    . GERD (gastroesophageal reflux disease)   . Glaucoma   . HTN (hypertension)   . Hyperlipidemia   . Ischemic cardiomyopathy    EF 45%cath 2012 EF55% 5/12; 35% 11/14  . Memory deficit   . Nephrolithiasis   . Obesity (BMI 30-39.9) 10/06/2015  . Osteoarthritis   . Overweight(278.02)   . Respiratory arrest//ACE Inhibitor presumed cause   . Senile dementia without  behavioral disturbance ([facility]) 10/06/2015  . Sleep apnea   . Small bowel obstruction (HCC)    from a sigmoid stricture  . Ventricular tachycardia ([place]) 03/17/2015  . Vitamin D deficiency 01/28/2017   Past Surgical History:  Procedure Laterality Date  . bilateral tubal ligation    . CARDIAC CATHETERIZATION N/A 03/18/2015   Procedure: LEFT HEART CATH AND CORONARY ANGIOGRAPHY;  Surgeon: Lorretta Harp, MD;  Location: [facility] CATH LAB;  Service: Cardiovascular;  Laterality: N/A;  . CARDIAC SURGERY    . HIP ARTHROPLASTY Right 11/10/2016   Procedure: ARTHROPLASTY BIPOLAR HIP (HEMIARTHROPLASTY);  Surgeon: Marchia Bond, MD;  Location: [facility];  Service: Orthopedics;  Laterality: Right;  . 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 LITHOTRIPSY      Allergies  Allergen Reactions  . Lisinopril Other (See Comments)    Reaction:  Respiratory arrest   . Spironolactone Anaphylaxis  . Vancomycin Anaphylaxis and Rash  . Donepezil Diarrhea  . Eggs Or Egg-Derived Products Diarrhea  . Erythromycin Other (See Comments)    Reaction:  Makes pt feel weird   . Macrolides And Ketolides Other (See Comments)    Reaction:  Unknown   . Penicillins Itching and Other (See Comments)    Has patient had a PCN reaction causing immediate rash, facial/tongue/throat swelling, SOB or lightheadedness with hypotension: No Has patient had a PCN reaction causing severe rash involving mucus membranes or skin necrosis: No Has patient had a PCN reaction that required hospitalization No Has patient had a PCN reaction occurring within the last 10 years: No If all of the above answers are "NO", then may proceed with Cephalosporin use.  . Quinapril Hcl Itching  . Angiotensin Receptor Blockers Other (See Comments)    Reaction:  Unknown   . Lipitor [Atorvastatin] Palpitations  . Pentazocine Lactate Other (See Comments)    Reaction:  Unknown   . Propoxyphene Hcl Other (See Comments)    Reaction unknown  .  Sulfonamide Derivatives Other (See Comments)    Reaction:  Unknown     Outpatient Encounter Medications as of 06/23/2019  Medication Sig  . acetaminophen (TYLENOL) 325 MG tablet Take 650 mg by mouth every 6 (six) hours as needed for mild pain or moderate pain.   . Ascorbic Acid (VITAMIN C) 1000 MG tablet Take 1,000 mg by mouth daily.  Marland Kitchen aspirin EC 81 MG tablet Take 81 mg by mouth daily.  Marland Kitchen atorvastatin (LIPITOR) 40 MG tablet Take 40 mg by mouth daily.   . Cholecalciferol (D3-50) 1.25 MG (50000 UT) capsule Take 50,000 Units by mouth once a week.   . clopidogrel (PLAVIX) 75 MG tablet Take 75 mg by mouth daily.   . furosemide (LASIX) 20 MG tablet Take 20 mg by mouth daily.   . insulin glargine (LANTUS) 100 UNIT/ML injection Inject 15 Units into the skin at bedtime. If blood sugar is > 150  . insulin lispro (HUMALOG KWIKPEN) 100 UNIT/ML KiwkPen Inject 4 Units into the skin 3 (three) times  daily with meals.   . isosorbide mononitrate (IMDUR) 30 MG 24 hr tablet Take 0.5 tablets (15 mg total) by mouth daily.  . memantine (NAMENDA) 10 MG tablet Take 10 mg by mouth 2 (two) times daily.   . metFORMIN (GLUCOPHAGE) 1000 MG tablet Take 1,000 mg by mouth 2 (two) times daily with a meal.   . metoprolol succinate (TOPROL XL) 25 MG 24 hr tablet Take 0.5 tablets (12.5 mg total) by mouth daily.  . mirtazapine (REMERON) 7.5 MG tablet Take 7.5 mg by mouth at bedtime.   . mometasone-formoterol (DULERA) 100-5 MCG/ACT AERO Inhale 2 puffs into the lungs 2 (two) times daily.   . nitroGLYCERIN (NITROSTAT) 0.4 MG SL tablet Place 0.4 mg under the tongue every 5 (five) minutes as needed for chest pain.  . Nutritional Supplement LIQD Take 120 mLs by mouth 2 (two) times a day.  Marland Kitchen NUTRITIONAL SUPPLEMENTS PO Take 1 each by mouth 2 (two) times a day. Magic cup with lunch and dinner meal   . ondansetron (ZOFRAN) 4 MG tablet Take 4 mg by mouth every 6 (six) hours as needed for nausea or vomiting.  . pantoprazole (PROTONIX) 40 MG  tablet Take 40 mg by mouth daily.  . polyethylene glycol (MIRALAX / GLYCOLAX) packet Take 17 g by mouth daily as needed for mild constipation.  . sennosides-docusate sodium (SENOKOT-S) 8.6-50 MG tablet Take 2 tablets by mouth daily.  . traMADol (ULTRAM) 50 MG tablet Take 1 tablet (50 mg total) by mouth 2 (two) times daily. Back pain  . traZODone (DESYREL) 50 MG tablet Take 25 mg by mouth at bedtime.   Marland Kitchen zinc sulfate 220 (50 Zn) MG capsule Take 220 mg by mouth daily.   No facility-administered encounter medications on file as of 06/23/2019.     Review of Systems   Is unobtainable secondary to dementia    Immunization History  Administered Date(s) Administered  . Influenza Split 08/20/2014  . Influenza, High Dose Seasonal PF 10/06/2013  . Influenza-Unspecified 10/17/2017, 08/29/2018  . PPD Test 11/16/2016, 12/05/2016  . Pneumococcal Conjugate-13 10/17/2017  . Pneumococcal Polysaccharide-23 09/24/2009  . Tdap 10/18/2017   Pertinent  Health Maintenance Due  Topic Date Due  . HEMOGLOBIN A1C  04/14/2019  . INFLUENZA VACCINE  06/21/2019  . FOOT EXAM  07/09/2019 (Originally 11/03/1951)  . OPHTHALMOLOGY EXAM  09/08/2020 (Originally 11/03/1951)  . URINE MICROALBUMIN  04/30/2020  . DEXA SCAN  Completed  . PNA vac Low Risk Adult  Completed   Fall Risk  04/19/2018  Falls in the past year? No   Functional Status Survey:    Vitals:   06/23/19 0955  BP: 134/80  Pulse: 86  Resp: 18  Temp: (!) 97.2 F (36.2 C)  TempSrc: Oral  Weight: 124 lb 4.8 oz (56.4 kg)  Height: 5\' 4"  (1.626 m)   Body mass index is 21.34 kg/m. Physical Exam   In general is a pleasant elderly female in no distress lying comfortably in bed.  Her skin is warm and dry.  Eyes sclera and conjunctive are clear visual acuity appears grossly intact.  Oropharynx is clear mucous membranes moist.  Chest is clear to auscultation with somewhat poor respiratory effort there is no labored breathing.  Heart is  regular rate and rhythm without murmur gallop or rub she does not have significant lower extremity edema.  Abdomen is soft nontender with positive bowel sounds.  Musculoskeletal she does move all extremities x4 limited exam since she is in bed but strength  appears to be relatively baseline in all 4 extremities  Neurologic she is alert I could not really appreciate any lateralizing findings her speech is clear but she does not speak much  Psych she is oriented to self she is pleasant cooperative Labs reviewed:  Apr 03, 2019.  Sodium 144 potassium 4 BUN 28.2 creatinine 0.84.  ALT less than 5 AST 13 alkaline phosphatase 65 albumin was 3.9.   Recent Labs    10/14/18  NA 140  K 4.3  BUN 18  CREATININE 0.7   Recent Labs    10/14/18  AST 12*  ALT 12  ALKPHOS 81   Recent Labs    10/14/18  WBC 9.2  HGB 13.2  HCT 39  PLT 172   Lab Results  Component Value Date   TSH 4.91 10/21/2018   Lab Results  Component Value Date   HGBA1C 6.8 10/14/2018   Lab Results  Component Value Date   CHOL 127 10/14/2018   HDL 37 10/14/2018   LDLCALC 69 10/14/2018   TRIG 106 10/14/2018   CHOLHDL 2 12/19/2012    Significant Diagnostic Results in last 30 days:  No results found.  Assessment/Plan  1-history of dementia at this point she appears to be stable continue supportive care she is lost some weight she is on dietary supplementation as well as Remeron I suspect for appetite stimulation at this point will monitor her weight loss appears to be moderating.  2.-  Type 2 diabetes as noted above she was in hemoglobin A1c was 6.8 blood sugars are largely running in the 100s-she is on Glucophage thousand milligrams twice daily-Lantus 15 units at night if CBG is greater than 150 as well as NovoLog 4 units with meals.  She is also on a statin LDL was 69 in November-he is not on an ACE because she is allergic to ACE inhibitors.  3.  Hypertension as noted above this appears to be under decent  control on Toprol-XL 12.5 mg a day as well as Lasix 20 mg a day and Imdur 15 mg a day.  4 history of coronary artery disease appears to be stable and relatively asymptomatic she is on Toprol Imdur aspirin and Plavix as well as PRN nitro.  5.  History of hyperlipidemia again she is on a statin atorvastatin 40 mg a day LDL noted to be 69.  6.  History of diastolic CHF-she does not really show evidence of increased edema concerning weight gain certainly-she is on Lasix 20 mg a day as well as Toprol-XL 12.5 mg a day-.  7.  History of COPD continues on Dulera stable and appears.  8.  History of GERD she continues on Protonix.  She does have Zofran as needed.  9.  History of pain management she is on tramadol twice daily she appears to be comfortable nursing has not noted any sedation concerns to my knowledge.  10.  For apparent insomnia she continues on trazodone 25 mg at night.  [id number]

## 2019-07-01 LAB — CBC AND DIFFERENTIAL
HCT: 40 (ref 36–46)
Hemoglobin: 13.2 (ref 12.0–16.0)
Neutrophils Absolute: 5
Platelets: 156 (ref 150–399)
WBC: 9

## 2019-07-01 LAB — BASIC METABOLIC PANEL
BUN: 21 (ref 4–21)
Creatinine: 0.8 (ref 0.5–1.1)
Glucose: 174
Potassium: 4.2 (ref 3.4–5.3)
Sodium: 146 (ref 137–147)

## 2019-07-01 LAB — HEMOGLOBIN A1C: Hemoglobin A1C: 6.5

## 2019-07-02 ENCOUNTER — Non-Acute Institutional Stay (SKILLED_NURSING_FACILITY): Payer: Medicare Other | Admitting: Internal Medicine

## 2019-07-02 ENCOUNTER — Encounter: Payer: Self-pay | Admitting: Internal Medicine

## 2019-07-02 DIAGNOSIS — E870 Hyperosmolality and hypernatremia: Secondary | ICD-10-CM

## 2019-07-02 DIAGNOSIS — I5032 Chronic diastolic (congestive) heart failure: Secondary | ICD-10-CM | POA: Diagnosis not present

## 2019-07-02 DIAGNOSIS — E1159 Type 2 diabetes mellitus with other circulatory complications: Secondary | ICD-10-CM

## 2019-07-02 DIAGNOSIS — I10 Essential (primary) hypertension: Secondary | ICD-10-CM | POA: Diagnosis not present

## 2019-07-02 DIAGNOSIS — Z794 Long term (current) use of insulin: Secondary | ICD-10-CM

## 2019-07-02 NOTE — Progress Notes (Signed)
Location:  [facility] Room Number: 202-W Place of Service:  SNF [phone]) Provider:  Granville Lewis, PA-C  Hennie Duos, MD  Patient Care Team: Hennie Duos, MD as PCP - General (Internal Medicine)  Extended Emergency Contact Information Primary Emergency Contact: [name],[name] Address: [address]          [place], [place] [address] [name] of [place] Phone: [phone] Work Phone: [phone] Mobile Phone: [phone] Relation: Daughter Secondary Emergency Contact: [name],[name]  [place] Phone: [phone] Relation: Niece  Code Status:  DNR Goals of care: Advanced Directive information Advanced Directives 07/02/2019  Does Patient Have a Medical Advance Directive? Yes  Type of Advance Directive Out of facility DNR (pink MOST or yellow form)  Does patient want to make changes to medical advance directive? No - Patient declined  Copy of [place] in Chart? -  Would patient like information on creating a medical advance directive? -  Pre-existing out of facility DNR order (yellow form or pink MOST form) Yellow form placed in chart (order not valid for inpatient use)     Chief Complaint  Patient presents with   Acute Visit    Patient is seen for mild hypernatremia (sodium 146)    HPI:  Pt is a 78 y.o. female seen today for an acute visit secondary to mild hypernatremia (sodium 146).   She is a long-term resident of the facility with a history of dementia as well as hypertension coronary artery disease diastolic CHF type 2 diabetes COPD and GERD.  Nursing has not really reported any recent acute issues.  Routine lab done that show her sodium is mildly elevated at 146.  She is on Lasix 20 mg a day with a history of diastolic CHF which appears to be well compensated.  Currently she is sitting in a wheelchair comfortably a bit fidgety wanting to go to bed but per staff that is not  unusual.  She does not complain of any shortness of breath or chest pain is dizziness again she is a poor historian secondary to dementia nursing has not reported any issues Past Medical History:  Diagnosis Date   Allergy to ACE inhibitors 03/17/2015   Anxiety    CAD (coronary artery disease)    a. s/p inferior STEMI 12/29/10 with rotablator atherectomy RCA 12/31/10 and DES x 2 RCA;  b. Lexiscan Myoview (02/2015): mild reversible apical anterior perfusion defect. C. cath 02/2015 50% LAD lesion with patent stent, Tx Rx     Cardiomyopathy, ischemic 03/17/2015   Chronic diastolic CHF (congestive heart failure) ([place]) 11/12/2016   COPD (chronic obstructive pulmonary disease) ([place])    Coronary atherosclerosis of native coronary artery 04/03/2013   Dementia ([place])    Depression 03/14/2017   Diverticulosis    DM2 (diabetes mellitus, type 2) ([place]) 04/02/2013   Dysphagia 05/10/2017   Minimal 6/20 - pureed diet   Fibromyalgia 04/20/2008   Qualifier: Diagnosis of  By: Harlon Ditty CMA (AAMA), Dottie     GERD (gastroesophageal reflux disease)    Glaucoma    HTN (hypertension)    Hyperlipidemia    Ischemic cardiomyopathy    EF 45%cath 2012 EF55% 5/12; 35% 11/14   Memory deficit    Nephrolithiasis    Obesity (BMI 30-39.9) 10/06/2015   Osteoarthritis    Overweight(278.02)    Respiratory arrest//ACE Inhibitor presumed cause    Senile dementia without behavioral disturbance ([place]) 10/06/2015   Sleep apnea  Small bowel obstruction (HCC)    from a sigmoid stricture   Ventricular tachycardia ([place]) 03/17/2015   Vitamin D deficiency 01/28/2017   Past Surgical History:  Procedure Laterality Date   bilateral tubal ligation     CARDIAC CATHETERIZATION N/A 03/18/2015   Procedure: LEFT HEART CATH AND CORONARY ANGIOGRAPHY;  Surgeon: Lorretta Harp, MD;  Location[facility] CATH LAB;  Service: Cardiovascular;  Laterality: N/A;   CARDIAC SURGERY     HIP ARTHROPLASTY Right 11/10/2016    Procedure: ARTHROPLASTY BIPOLAR HIP (HEMIARTHROPLASTY);  Surgeon: Marchia Bond, MD;  Location: [facility];  Service: Orthopedics;  Laterality: Right;   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  07/31/2008   LITHOTRIPSY      Allergies  Allergen Reactions   Lisinopril Other (See Comments)    Reaction:  Respiratory arrest    Spironolactone Anaphylaxis   Vancomycin Anaphylaxis and Rash   Donepezil Diarrhea   Eggs Or Egg-Derived Products Diarrhea   Erythromycin Other (See Comments)    Reaction:  Makes pt feel weird    Macrolides And Ketolides Other (See Comments)    Reaction:  Unknown    Penicillins Itching and Other (See Comments)    Has patient had a PCN reaction causing immediate rash, facial/tongue/throat swelling, SOB or lightheadedness with hypotension: No Has patient had a PCN reaction causing severe rash involving mucus membranes or skin necrosis: No Has patient had a PCN reaction that required hospitalization No Has patient had a PCN reaction occurring within the last 10 years: No If all of the above answers are "NO", then may proceed with Cephalosporin use.   Quinapril Hcl Itching   Angiotensin Receptor Blockers Other (See Comments)    Reaction:  Unknown    Lipitor [Atorvastatin] Palpitations   Pentazocine Lactate Other (See Comments)    Reaction:  Unknown    Propoxyphene Hcl Other (See Comments)    Reaction unknown   Sulfonamide Derivatives Other (See Comments)    Reaction:  Unknown     Outpatient Encounter Medications as of 07/02/2019  Medication Sig   acetaminophen (TYLENOL) 325 MG tablet Take 650 mg by mouth every 6 (six) hours as needed for mild pain or moderate pain.    Ascorbic Acid (VITAMIN C) 1000 MG tablet Take 1,000 mg by mouth daily.   aspirin EC 81 MG tablet Take 81 mg by mouth daily.   atorvastatin (LIPITOR) 40 MG tablet Take 40 mg by mouth daily.    Cholecalciferol (D3-50) 1.25 MG (50000 UT) capsule Take 50,000 Units by mouth  once a week.    clopidogrel (PLAVIX) 75 MG tablet Take 75 mg by mouth daily.    furosemide (LASIX) 20 MG tablet Take 20 mg by mouth daily.    insulin glargine (LANTUS) 100 UNIT/ML injection Inject 15 Units into the skin at bedtime. If blood sugar is > 150   insulin lispro (HUMALOG KWIKPEN) 100 UNIT/ML KiwkPen Inject 4 Units into the skin 3 (three) times daily with meals.    isosorbide mononitrate (IMDUR) 30 MG 24 hr tablet Take 0.5 tablets (15 mg total) by mouth daily.   memantine (NAMENDA) 10 MG tablet Take 10 mg by mouth 2 (two) times daily.    metFORMIN (GLUCOPHAGE) 1000 MG tablet Take 1,000 mg by mouth 2 (two) times daily with a meal.    metoprolol succinate (TOPROL XL) 25 MG 24 hr tablet Take 0.5 tablets (12.5 mg total) by mouth daily.   mirtazapine (REMERON) 7.5 MG tablet Take 7.5 mg by mouth  at bedtime.    mometasone-formoterol (DULERA) 100-5 MCG/ACT AERO Inhale 2 puffs into the lungs 2 (two) times daily.    nitroGLYCERIN (NITROSTAT) 0.4 MG SL tablet Place 0.4 mg under the tongue every 5 (five) minutes as needed for chest pain.   Nutritional Supplement LIQD Take 120 mLs by mouth 2 (two) times a day. MedPass   NUTRITIONAL SUPPLEMENTS PO Take 1 each by mouth 2 (two) times a day. Magic Cup with lunch and dinner meal   ondansetron (ZOFRAN) 4 MG tablet Take 4 mg by mouth every 6 (six) hours as needed for nausea or vomiting.   pantoprazole (PROTONIX) 40 MG tablet Take 40 mg by mouth daily.   polyethylene glycol (MIRALAX / GLYCOLAX) packet Take 17 g by mouth daily as needed for mild constipation.   sennosides-docusate sodium (SENOKOT-S) 8.6-50 MG tablet Take 2 tablets by mouth daily.   traMADol (ULTRAM) 50 MG tablet Take 1 tablet (50 mg total) by mouth 2 (two) times daily. Back pain   traZODone (DESYREL) 50 MG tablet Take 25 mg by mouth at bedtime.    zinc sulfate 220 (50 Zn) MG capsule Take 220 mg by mouth daily.   No facility-administered encounter medications on file as  of 07/02/2019.     Review of Systems   Again this is largely unobtainable secondary to dementia please see HPI  Immunization History  Administered Date(s) Administered   Influenza Split 08/20/2014   Influenza, High Dose Seasonal PF 10/06/2013   Influenza-Unspecified 10/17/2017, 08/29/2018   PPD Test 11/16/2016, 12/05/2016   Pneumococcal Conjugate-13 10/17/2017   Pneumococcal Polysaccharide-23 09/24/2009   Tdap 10/18/2017   Pertinent  Health Maintenance Due  Topic Date Due   INFLUENZA VACCINE  06/21/2019   FOOT EXAM  07/09/2019 (Originally 11/03/1951)   OPHTHALMOLOGY EXAM  09/08/2020 (Originally 11/03/1951)   HEMOGLOBIN A1C  01/01/2020   URINE MICROALBUMIN  04/30/2020   DEXA SCAN  Completed   PNA vac Low Risk Adult  Completed   Fall Risk  04/19/2018  Falls in the past year? No   Functional Status Survey:    Vitals:   07/02/19 1033  BP: 120/72  Pulse: 68  Resp: 18  Temp: 98.6 F (37 C)  TempSrc: Oral  Weight: 124 lb 3.2 oz (56.3 kg)  Height: 5\' 4"  (1.626 m)   Body mass index is 21.32 kg/m. Physical Exam  In general this is a pleasant elderly female in no distress sitting comfortably in her wheelchair.  Her skin is warm and dry.  Eyes visual acuity appears to be intact sclera and conjunctive are clear.  Oropharynx is clear mucous membranes moist.  Chest is clear to auscultation with poor respiratory effort there is no labored breathing I could not really hear any overt congestion.  Heart is regular rate and rhythm borderline tachycardic in the high 90s-she is a bit fidgety which I suspect is making her heart rate go a little bit higher. She does not have significant lower extremity edema.  Abdomen is soft nontender with positive bowel sounds.  Musculoskeletal appears able to move all her extremities x4 it appears at baseline with baseline lower extremity weakness.  Neurologic appears to be grossly intact her speech is clear cannot really  appreciate lateralizing findings.  Psych she is oriented to self only will follow simple verbal commands with some prompting-she is pleasant and cooperative with exam again a little bit fidgety wanting to go to bed.    Labs reviewed: Recent Labs    10/14/18 04/03/19  07/01/19  NA 140 144 146  K 4.3 4.0 4.2  BUN 18 28* 21  CREATININE 0.7 0.8 0.8   Recent Labs    10/14/18 04/03/19  AST 12* 13  ALT 12 5*  ALKPHOS 81 65   Recent Labs    10/14/18 07/01/19  WBC 9.2 9.0  NEUTROABS  --  5  HGB 13.2 13.2  HCT 39 40  PLT 172 156   Lab Results  Component Value Date   TSH 4.91 10/21/2018   Lab Results  Component Value Date   HGBA1C 6.5 07/01/2019   Lab Results  Component Value Date   CHOL 127 10/14/2018   HDL 37 10/14/2018   LDLCALC 69 10/14/2018   TRIG 106 10/14/2018   CHOLHDL 2 12/19/2012    Significant Diagnostic Results in last 30 days:  No results found.  Assessment/Plan  #1 mild hypernatremia- clinically she appears to be stable she is on low-dose Lasix will hold this for a couple days recheck a metabolic panel on the first laboratory day next week-.  In regards to diastolic CHF this appears stable she does not really have evidence of congestion or edema or complaints of shortness of breath.  She does continue on beta-blocker Imdur.  2.  Diabetes type 2 hemoglobin A1c is satisfactory at 6.5 her blood sugars are largely in the middle 100s she continues on Lantus 15 units a day as well as NovoLog 4 units with meals and Glucophage thousand milligrams twice daily.  3.  Hypertension this appears to be stable on Toprol-XL 12.5 mg a day again Lasix will be held for couple days recent blood pressures 120/72-118/70 126/67  [id number]

## 2019-07-07 LAB — BASIC METABOLIC PANEL
BUN: 22 — AB (ref 4–21)
Creatinine: 0.8 (ref 0.5–1.1)
Glucose: 108
Potassium: 4.6 (ref 3.4–5.3)
Sodium: 142 (ref 137–147)

## 2019-07-09 ENCOUNTER — Encounter: Payer: Self-pay | Admitting: Internal Medicine

## 2019-07-09 DIAGNOSIS — Z0000 Encounter for general adult medical examination without abnormal findings: Secondary | ICD-10-CM

## 2019-07-09 NOTE — Progress Notes (Deleted)
Subjective:   [name] is a 78 y.o. female who presents for Medicare Annual (Subsequent) preventive examination.  Review of Systems:  ***       Objective:     Vitals: BP 136/77    Pulse 90    Temp 98.8 F (37.1 C) (Oral)    Resp 18    Ht 5\' 4"  (1.626 m)    Wt 126 lb 3.2 oz (57.2 kg)    LMP  (LMP Unknown)    BMI 21.66 kg/m   Body mass index is 21.66 kg/m.  Advanced Directives 07/02/2019 06/23/2019 04/22/2019 04/03/2019 03/03/2019 05/10/2018 04/19/2018  Does Patient Have a Medical Advance Directive? Yes Yes Yes Yes Yes Yes Yes  Type of Advance Directive Out of facility DNR (pink MOST or yellow form) Out of facility DNR (pink MOST or yellow form) Out of facility DNR (pink MOST or yellow form) Out of facility DNR (pink MOST or yellow form) Out of facility DNR (pink MOST or yellow form) Out of facility DNR (pink MOST or yellow form) Out of facility DNR (pink MOST or yellow form)  Does patient want to make changes to medical advance directive? No - Patient declined No - Patient declined No - Patient declined No - Patient declined No - Guardian declined - No - Patient declined  Copy of [place] in Chart? - - - - - - -  Would patient like information on creating a medical advance directive? - - - - - - -  Pre-existing out of facility DNR order (yellow form or pink MOST form) Yellow form placed in chart (order not valid for inpatient use) Yellow form placed in chart (order not valid for inpatient use) - Yellow form placed in chart (order not valid for inpatient use) - Yellow form placed in chart (order not valid for inpatient use) Yellow form placed in chart (order not valid for inpatient use)    Tobacco Social History   Tobacco Use  Smoking Status Former Smoker   Packs/day: 1.00   Years: 25.00   Pack years: 25.00   Types: Cigarettes  Smokeless Tobacco Never Used  Tobacco Comment   over a ppd for 40+ years; quit 10/2004.  unsuccessfully tried eCigs.       Counseling given: Not Answered Comment: over a ppd for 40+ years; quit 10/2004.  unsuccessfully tried eCigs.   Clinical Intake:                       Past Medical History:  Diagnosis Date   Allergy to ACE inhibitors 03/17/2015   Anxiety    CAD (coronary artery disease)    a. s/p inferior STEMI 12/29/10 with rotablator atherectomy RCA 12/31/10 and DES x 2 RCA;  b. Lexiscan Myoview (02/2015): mild reversible apical anterior perfusion defect. C. cath 02/2015 50% LAD lesion with patent stent, Tx Rx     Cardiomyopathy, ischemic 03/17/2015   Chronic diastolic CHF (congestive heart failure) ([place]) 11/12/2016   COPD (chronic obstructive pulmonary disease) ([place])    Coronary atherosclerosis of native coronary artery 04/03/2013   Dementia ([place])    Depression 03/14/2017   Diverticulosis    DM2 (diabetes mellitus, type 2) ([place]) 04/02/2013   Dysphagia 05/10/2017   Minimal 6/20 - pureed diet   Fibromyalgia 04/20/2008   Qualifier: Diagnosis of  By: Harlon Ditty CMA (AAMA), [place]     GERD (gastroesophageal reflux disease)    Glaucoma    HTN (hypertension)  Hyperlipidemia    Ischemic cardiomyopathy    EF 45%cath 2012 EF55% 5/12; 35% 11/14   Memory deficit    Nephrolithiasis    Obesity (BMI 30-39.9) 10/06/2015   Osteoarthritis    Overweight(278.02)    Respiratory arrest//ACE Inhibitor presumed cause    Senile dementia without behavioral disturbance ([place]) 10/06/2015   Sleep apnea    Small bowel obstruction ([place])    from a sigmoid stricture   Ventricular tachycardia ([place]) 03/17/2015   Vitamin D deficiency 01/28/2017   Past Surgical History:  Procedure Laterality Date   bilateral tubal ligation     CARDIAC CATHETERIZATION N/A 03/18/2015   Procedure: LEFT HEART CATH AND CORONARY ANGIOGRAPHY;  Surgeon: Lorretta Harp, MD;  Location: [facility] CATH LAB;  Service: Cardiovascular;  Laterality: N/A;   CARDIAC SURGERY     HIP ARTHROPLASTY Right 11/10/2016    Procedure: ARTHROPLASTY BIPOLAR HIP (HEMIARTHROPLASTY);  Surgeon: Marchia Bond, MD;  Location: [facility];  Service: Orthopedics;  Laterality: Right;   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  07/31/2008   LITHOTRIPSY     Family History  Problem Relation Age of Onset   Diabetes Father    Heart disease Father    Heart attack Father    Stroke Mother    Breast cancer Maternal Grandmother    Diabetes Sister    Hypertension Sister    Colon cancer Neg Hx    Social History   Socioeconomic History   Marital status: Widowed    Spouse name: Not on file   Number of children: 2   Years of education: 11   Highest education level: Not on file  Occupational History   Occupation: Retired    Comment: Veterinary surgeon strain: Not hard at all   Food insecurity    Worry: Never true    Inability: Never true   Transportation needs    Medical: No    Non-medical: No  Tobacco Use   Smoking status: Former Smoker    Packs/day: 1.00    Years: 25.00    Pack years: 25.00    Types: Cigarettes   Smokeless tobacco: Never Used   Tobacco comment: over a ppd for 40+ years; quit 10/2004.  unsuccessfully tried eCigs.  Substance and Sexual Activity   Alcohol use: No    Alcohol/week: 1.0 standard drinks    Types: 1 Standard drinks or equivalent per week   Drug use: No   Sexual activity: Never  Lifestyle   Physical activity    Days per week: 0 days    Minutes per session: 0 min   Stress: Only a little  Relationships   Social connections    Talks on phone: Twice a week    Gets together: Twice a week    Attends religious service: Never    Active member of club or organization: No    Attends meetings of clubs or organizations: Never    Relationship status: Widowed  Other Topics Concern   Not on file  Social History Narrative   Admitted to [facility] 11/16/16   Patient is widowed with 2  children.   Patient is right handed.   Patient has 11 th grade education.   Former smoker-can't smoke since admitted to [facility]   Alcohol none   DNR    Outpatient Encounter Medications as of 07/09/2019  Medication Sig   acetaminophen (TYLENOL) 325 MG tablet Take 650 mg  by mouth every 6 (six) hours as needed for mild pain or moderate pain.    aspirin EC 81 MG tablet Take 81 mg by mouth daily.   atorvastatin (LIPITOR) 40 MG tablet Take 40 mg by mouth daily.    Cholecalciferol (D3-50) 1.25 MG (50000 UT) capsule Take 50,000 Units by mouth once a week.    clopidogrel (PLAVIX) 75 MG tablet Take 75 mg by mouth daily.    furosemide (LASIX) 20 MG tablet Take 20 mg by mouth daily.    insulin glargine (LANTUS) 100 UNIT/ML injection Inject 15 Units into the skin at bedtime. If blood sugar is > 150   insulin lispro (HUMALOG KWIKPEN) 100 UNIT/ML KiwkPen Inject 4 Units into the skin 3 (three) times daily with meals.    isosorbide mononitrate (IMDUR) 30 MG 24 hr tablet Take 0.5 tablets (15 mg total) by mouth daily.   memantine (NAMENDA) 10 MG tablet Take 10 mg by mouth 2 (two) times daily.    metFORMIN (GLUCOPHAGE) 1000 MG tablet Take 1,000 mg by mouth 2 (two) times daily with a meal.    metoprolol succinate (TOPROL XL) 25 MG 24 hr tablet Take 0.5 tablets (12.5 mg total) by mouth daily.   mirtazapine (REMERON) 7.5 MG tablet Take 7.5 mg by mouth at bedtime.    mometasone-formoterol (DULERA) 100-5 MCG/ACT AERO Inhale 2 puffs into the lungs 2 (two) times daily.    nitroGLYCERIN (NITROSTAT) 0.4 MG SL tablet Place 0.4 mg under the tongue every 5 (five) minutes as needed for chest pain.   Nutritional Supplement LIQD Take 120 mLs by mouth 2 (two) times a day. MedPass   NUTRITIONAL SUPPLEMENTS PO Take 1 each by mouth 2 (two) times a day. Magic Cup with lunch and dinner meal   ondansetron (ZOFRAN) 4 MG tablet Take 4 mg by mouth every 6 (six) hours as needed for nausea or vomiting.    pantoprazole (PROTONIX) 40 MG tablet Take 40 mg by mouth daily.   polyethylene glycol (MIRALAX / GLYCOLAX) packet Take 17 g by mouth daily as needed for mild constipation.   sennosides-docusate sodium (SENOKOT-S) 8.6-50 MG tablet Take 2 tablets by mouth daily.   traMADol (ULTRAM) 50 MG tablet Take 1 tablet (50 mg total) by mouth 2 (two) times daily. Back pain   traZODone (DESYREL) 50 MG tablet Take 25 mg by mouth at bedtime.    No facility-administered encounter medications on file as of 07/09/2019.     Activities of Daily Living No flowsheet data found.  Patient Care Team: Hennie Duos, MD as PCP - General (Internal Medicine)    Assessment:   This is a routine wellness examination for [place].  Exercise Activities and Dietary recommendations    Goals   None     Fall Risk Fall Risk  04/19/2018  Falls in the past year? No   Is the patient's home free of loose throw rugs in walkways, pet beds, electrical cords, etc?   {Blank single:19197::"yes","no"}      Grab bars in the bathroom? {Blank single:19197::"yes","no"}      Handrails on the stairs?   {Blank single:19197::"yes","no"}      Adequate lighting?   {Blank single:19197::"yes","no"}  Timed Get Up and Go performed: ***  Depression Screen PHQ 2/9 Scores 04/19/2018  PHQ - 2 Score 0     Cognitive Function MMSE - Mini Mental State Exam 07/06/2015 07/02/2014  Orientation to time 1 2  Orientation to Place 4 3  Registration 3 3  Attention/ Calculation  5 4  Recall 3 0  Language- name 2 objects 1 2  Language- repeat 1 1  Language- follow 3 step command 3 3  Language- read & follow direction 1 1  Write a sentence 1 1  Copy design 0 1  Total score 23 21     6CIT Screen 04/19/2018  What Year? 4 points  What month? 3 points  What time? 3 points  Count back from 20 0 points  Months in reverse 4 points  Repeat phrase 8 points  Total Score 22    Immunization History  Administered Date(s) Administered    Influenza Split 08/20/2014   Influenza, High Dose Seasonal PF 10/06/2013   Influenza-Unspecified 10/17/2017, 08/29/2018   PPD Test 11/16/2016, 12/05/2016   Pneumococcal Conjugate-13 10/17/2017   Pneumococcal Polysaccharide-23 09/24/2009   Tdap 10/18/2017    Qualifies for Shingles Vaccine?***  Screening Tests Health Maintenance  Topic Date Due   INFLUENZA VACCINE  06/21/2019   FOOT EXAM  07/09/2019 (Originally 11/03/1951)   OPHTHALMOLOGY EXAM  09/08/2020 (Originally 11/03/1951)   HEMOGLOBIN A1C  01/01/2020   URINE MICROALBUMIN  04/30/2020   TETANUS/TDAP  10/19/2027   DEXA SCAN  Completed   PNA vac Low Risk Adult  Completed    Cancer Screenings: Lung: Low Dose CT Chest recommended if Age 51-80 years, 30 pack-year currently smoking OR have quit w/in 15years. Patient {DOES NOT does:27190::"does not"} qualify. Breast:  Up to date on Mammogram? {Yes/No:30480221}   Up to date of Bone Density/Dexa? {Yes/No:30480221} Colorectal: ***  Additional Screenings: ***: Hepatitis C Screening:      Plan:   ***   I have personally reviewed and noted the following in the patients chart:    Medical and social history  Use of alcohol, tobacco or illicit drugs   Current medications and supplements  Functional ability and status  Nutritional status  Physical activity  Advanced directives  List of other physicians  Hospitalizations, surgeries, and ER visits in previous 12 months  Vitals  Screenings to include cognitive, depression, and falls  Referrals and appointments  In addition, I have reviewed and discussed with patient certain preventive protocols, quality metrics, and best practice recommendations. A written personalized care plan for preventive services as well as general preventive health recommendations were provided to patient.     Elmore Guise, [place]  07/09/2019

## 2019-07-10 ENCOUNTER — Encounter: Payer: Self-pay | Admitting: Internal Medicine

## 2019-07-10 ENCOUNTER — Non-Acute Institutional Stay (SKILLED_NURSING_FACILITY): Payer: Medicare Other | Admitting: Internal Medicine

## 2019-07-10 DIAGNOSIS — Z0000 Encounter for general adult medical examination without abnormal findings: Secondary | ICD-10-CM

## 2019-07-10 NOTE — Progress Notes (Signed)
Subjective:   [name] is a 78 y.o. female who presents for Medicare Annual (Subsequent) preventive examination.  Review of Systems:  Is unobtainable secondary to dementia he is not really complaining of any pain at this time is discomfort has not noted recent issues       Objective:     Vitals: BP 127/62   Pulse 82   Temp 98.4 F (36.9 C) (Oral)   Resp 19   Ht 5\' 4"  (1.626 m)   Wt 126 lb 3.2 oz (57.2 kg)   LMP  (LMP Unknown)   BMI 21.66 kg/m   Body mass index is 21.66 kg/m.  General this is a pleasant elderly female sitting comfortably in a wheelchair.  Her skin is warm and dry.  Eyes visual acuity appears grossly intact sclera and conjunctive are clear.  Oropharynx is clear mucous membranes moist.  Chest is clear to auscultation with poor respiratory effort she is not really following verbal instructions well.  Heart is regular rate and rhythm in the 90s-she does not have significant lower extremity edema.  Abdomen is soft nontender with positive bowel sounds.  Musculoskeletal moves all extremities x4 with baseline lower extremity weakness.  Neurologic is grossly intact speech is clear could not really appreciate true lateral.  Psych she is oriented to self has difficulty following verbal commands but is not agitated with exam   Advanced Directives 07/10/2019 07/09/2019 07/02/2019 06/23/2019 04/22/2019 04/03/2019 03/03/2019  Does Patient Have a Medical Advance Directive? Yes Yes Yes Yes Yes Yes Yes  Type of Advance Directive Out of facility DNR (pink MOST or yellow form) Out of facility DNR (pink MOST or yellow form) Out of facility DNR (pink MOST or yellow form) Out of facility DNR (pink MOST or yellow form) Out of facility DNR (pink MOST or yellow form) Out of facility DNR (pink MOST or yellow form) Out of facility DNR (pink MOST or yellow form)  Does patient want to make changes to medical advance directive? No - Patient declined No - Patient declined No - Patient  declined No - Patient declined No - Patient declined No - Patient declined No - Guardian declined  Copy of [place] in Chart? - - - - - - -  Would patient like information on creating a medical advance directive? - - - - - - -  Pre-existing out of facility DNR order (yellow form or pink MOST form) Yellow form placed in chart (order not valid for inpatient use) Yellow form placed in chart (order not valid for inpatient use) Yellow form placed in chart (order not valid for inpatient use) Yellow form placed in chart (order not valid for inpatient use) - Yellow form placed in chart (order not valid for inpatient use) -    Tobacco Social History   Tobacco Use  Smoking Status Former Smoker  . Packs/day: 1.00  . Years: 25.00  . Pack years: 25.00  . Types: Cigarettes  Smokeless Tobacco Never Used  Tobacco Comment   over a ppd for 40+ years; quit 10/2004.  unsuccessfully tried eCigs.     Counseling given: Not Answered Comment: over a ppd for 40+ years; quit 10/2004.  unsuccessfully tried eCigs.   Clinical Intake:  Pre-visit preparation completed: Yes  Pain : No/denies pain     BMI - recorded: 21.66 Nutritional Risks: Other (Comment) Diabetes: Yes CBG done?: Yes  How often do you need to have someone help you when you read instructions, pamphlets, or  other written materials from your doctor or pharmacy?: 5 - Always  Interpreter Needed?: No  Comments: Patient has a history of progressive dementia with status will be a challenge with her p.o. intake at some point this appears to have stabilized her weight stabilized which is encouraging-cognitive impairment however I suspect will continue to present with challenges  Past Medical History:  Diagnosis Date  . Allergy to ACE inhibitors 03/17/2015  . Anxiety   . CAD (coronary artery disease)    a. s/p inferior STEMI 12/29/10 with rotablator atherectomy RCA 12/31/10 and DES x 2 RCA;  b. Lexiscan Myoview (02/2015): mild  reversible apical anterior perfusion defect. C. cath 02/2015 50% LAD lesion with patent stent, Tx Rx    . Cardiomyopathy, ischemic 03/17/2015  . Chronic diastolic CHF (congestive heart failure) ([place]) 11/12/2016  . COPD (chronic obstructive pulmonary disease) ([place])   . Coronary atherosclerosis of native coronary artery 04/03/2013  . Dementia ([place])   . Depression 03/14/2017  . Diverticulosis   . DM2 (diabetes mellitus, type 2) ([place]) 04/02/2013  . Dysphagia 05/10/2017   Minimal 6/20 - pureed diet  . Fibromyalgia 04/20/2008   Qualifier: Diagnosis of  By: Nelson-Smith CMA (AAMA), Dottie    . GERD (gastroesophageal reflux disease)   . Glaucoma   . HTN (hypertension)   . Hyperlipidemia   . Ischemic cardiomyopathy    EF 45%cath 2012 EF55% 5/12; 35% 11/14  . Memory deficit   . Nephrolithiasis   . Obesity (BMI 30-39.9) 10/06/2015  . Osteoarthritis   . Overweight(278.02)   . Respiratory arrest//ACE Inhibitor presumed cause   . Senile dementia without behavioral disturbance ([place]) 10/06/2015  . Sleep apnea   . Small bowel obstruction ([facility])    from a sigmoid stricture  . Ventricular tachycardia ([place]) 03/17/2015  . Vitamin D deficiency 01/28/2017   Past Surgical History:  Procedure Laterality Date  . bilateral tubal ligation    . CARDIAC CATHETERIZATION N/A 03/18/2015   Procedure: LEFT HEART CATH AND CORONARY ANGIOGRAPHY;  Surgeon: Lorretta Harp, MD;  Location: [facility] CATH LAB;  Service: Cardiovascular;  Laterality: N/A;  . CARDIAC SURGERY    . HIP ARTHROPLASTY Right 11/10/2016   Procedure: ARTHROPLASTY BIPOLAR HIP (HEMIARTHROPLASTY);  Surgeon: Marchia Bond, MD;  Location: [facility];  Service: Orthopedics;  Laterality: Right;  . 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 LITHOTRIPSY     Family History  Problem Relation Age of Onset  . Diabetes Father   . Heart disease Father   . Heart attack Father   . Stroke Mother   . Breast cancer Maternal Grandmother   . Diabetes  Sister   . Hypertension Sister   . Colon cancer Neg Hx    Social History   Socioeconomic History  . Marital status: Widowed    Spouse name: Not on file  . Number of children: 2  . Years of education: 56  . Highest education level: Not on file  Occupational History  . Occupation: Retired    Comment: Haematologist   Social Needs  . Financial resource strain: Not hard at all  . Food insecurity    Worry: Never true    Inability: Never true  . Transportation needs    Medical: No    Non-medical: No  Tobacco Use  . Smoking status: Former Smoker    Packs/day: 1.00    Years: 25.00    Pack years: 25.00    Types: Cigarettes  . Smokeless  tobacco: Never Used  . Tobacco comment: over a ppd for 40+ years; quit 10/2004.  unsuccessfully tried eCigs.  Substance and Sexual Activity  . Alcohol use: No    Alcohol/week: 1.0 standard drinks    Types: 1 Standard drinks or equivalent per week  . Drug use: No  . Sexual activity: Never  Lifestyle  . Physical activity    Days per week: 0 days    Minutes per session: 0 min  . Stress: Only a little  Relationships  . Social Herbalist on phone: Twice a week    Gets together: Twice a week    Attends religious service: Never    Active member of club or organization: No    Attends meetings of clubs or organizations: Never    Relationship status: Widowed  Other Topics Concern  . Not on file  Social History Narrative   Admitted to [facility] 11/16/16   Patient is widowed with 2 children.   Patient is right handed.   Patient has 11 th grade education.   Former smoker-can't smoke since admitted to [facility]   Alcohol none   DNR    Outpatient Encounter Medications as of 07/10/2019  Medication Sig  . acetaminophen (TYLENOL) 325 MG tablet Take 650 mg by mouth every 6 (six) hours as needed for mild pain or moderate pain.   Marland Kitchen aspirin EC 81 MG tablet Take 81 mg by mouth daily.  Marland Kitchen atorvastatin (LIPITOR)  40 MG tablet Take 40 mg by mouth daily.   . Cholecalciferol (D3-50) 1.25 MG (50000 UT) capsule Take 50,000 Units by mouth once a week.   . clopidogrel (PLAVIX) 75 MG tablet Take 75 mg by mouth daily.   . furosemide (LASIX) 20 MG tablet Take 20 mg by mouth daily.   . insulin glargine (LANTUS) 100 UNIT/ML injection Inject 15 Units into the skin at bedtime. If blood sugar is > 150  . insulin lispro (HUMALOG KWIKPEN) 100 UNIT/ML KiwkPen Inject 4 Units into the skin 3 (three) times daily with meals.   . isosorbide mononitrate (IMDUR) 30 MG 24 hr tablet Take 0.5 tablets (15 mg total) by mouth daily.  . memantine (NAMENDA) 10 MG tablet Take 10 mg by mouth 2 (two) times daily.   . metFORMIN (GLUCOPHAGE) 1000 MG tablet Take 1,000 mg by mouth 2 (two) times daily with a meal.   . metoprolol succinate (TOPROL XL) 25 MG 24 hr tablet Take 0.5 tablets (12.5 mg total) by mouth daily.  . mirtazapine (REMERON) 7.5 MG tablet Take 7.5 mg by mouth at bedtime.   . mometasone-formoterol (DULERA) 100-5 MCG/ACT AERO Inhale 2 puffs into the lungs 2 (two) times daily.   . nitroGLYCERIN (NITROSTAT) 0.4 MG SL tablet Place 0.4 mg under the tongue every 5 (five) minutes as needed for chest pain.  . Nutritional Supplement LIQD Take 120 mLs by mouth 2 (two) times a day. MedPass  . NUTRITIONAL SUPPLEMENTS PO Take 1 each by mouth 2 (two) times a day. Magic Cup with lunch and dinner meal  . ondansetron (ZOFRAN) 4 MG tablet Take 4 mg by mouth every 6 (six) hours as needed for nausea or vomiting.  . pantoprazole (PROTONIX) 40 MG tablet Take 40 mg by mouth daily.  . polyethylene glycol (MIRALAX / GLYCOLAX) packet Take 17 g by mouth daily as needed for mild constipation.  . sennosides-docusate sodium (SENOKOT-S) 8.6-50 MG tablet Take 2 tablets by mouth daily.  . traMADol Veatrice Bourbon)  50 MG tablet Take 1 tablet (50 mg total) by mouth 2 (two) times daily. Back pain  . traZODone (DESYREL) 50 MG tablet Take 25 mg by mouth at bedtime.    No  facility-administered encounter medications on file as of 07/10/2019.     Activities of Daily Living No flowsheet data found.  Patient Care Team: Hennie Duos, MD as PCP - General (Internal Medicine)    Assessment:   This is a routine wellness examination for Chrisma.  Exercise Activities and Dietary recommendations   --- Patient continues to be pretty much totally dependent secondary to her cognitive issues with significant dementia  She has worked with PT and ST  Goals   None   Need to support her quality life as much as possible with recognition she does have a progressive dementia- her nutritional intake will have to be watched --her weight appears to have stabilized recently this will have to be monitored.  Supportive care will need to be encouraged Fall Risk  04/19/2018  Falls in the past year? No   Is the patient's home free of loose throw rugs in walkways, pet beds, electrical cords, etc?   yes      Grab bars in the bathroom? yes      Handrails on the stairs?   Does not apply      Adequate lighting?   yes  Timed Get Up and Go performed: not performed   Depression Screen PHQ 2/9 Scores 04/19/2018  PHQ - 2 Score 0  Patient mood disorder test was difficult to assess since patient did not really understand question  Cognitive Function MMSE - Mini Mental State Exam 07/06/2015 07/02/2014  Orientation to time 1 2  Orientation to Place 4 3  Registration 3 3  Attention/ Calculation 5 4  Recall 3 0  Language- name 2 objects 1 2  Language- repeat 1 1  Language- follow 3 step command 3 3  Language- read & follow direction 1 1  Write a sentence 1 1  Copy design 0 1  Total score 23 21  Patient did  have a BM's test yesterday scored 3 out of 15   6CIT Screen 04/19/2018  What Year? 4 points  What month? 3 points  What time? 3 points  Count back from 20 0 points  Months in reverse 4 points  Repeat phrase 8 points  Total Score 22    Immunization History   Administered Date(s) Administered  . Influenza Split 08/20/2014  . Influenza, High Dose Seasonal PF 10/06/2013  . Influenza-Unspecified 10/17/2017, 08/29/2018  . PPD Test 11/16/2016, 12/05/2016  . Pneumococcal Conjugate-13 10/17/2017  . Pneumococcal Polysaccharide-23 09/24/2009  . Tdap 10/18/2017    Screening Tests Health Maintenance  Topic Date Due  . FOOT EXAM  11/03/1951  . INFLUENZA VACCINE  06/21/2019  . OPHTHALMOLOGY EXAM  09/08/2020 (Originally 11/03/1951)  . HEMOGLOBIN A1C  01/01/2020  . URINE MICROALBUMIN  04/30/2020  . TETANUS/TDAP  10/19/2027  . DEXA SCAN  Completed  . PNA vac Low Risk Adult  Completed    Cancer Screenings: Lung: Low Dose CT Chest recommended if Age 62-80 years, 30 pack-year currently smoking OR have quit w/in 15years. Patient does not qualify. Breast:  Up to date on Mammogram?  Secondary to dementia and advanced age patient would be a poor candidate for any aggressive study Up to date of Bone Density/Dexa? Yes Colorectal: Secondary to COVID-19: Quarantine patients have not been going out for routine studies---  Plan:   At this point goal will decrease in quality of life as much as possible with recognition of progressive dementia-nutritional intake and weight will have to be watched closely as well as any propensity for developing sores- signs of pain also will need to be watched closely  I have personally reviewed and noted the following in the patient's chart:   . Medical and social history . Use of alcohol, tobacco or illicit drugs  . Current medications and supplements . Functional ability and status . Nutritional status . Physical activity . Advanced directives . List of other physicians . Hospitalizations, surgeries, and ER visits in previous 12 months . Vitals . Screenings to include cognitive, depression, and falls . Referrals and appointments   --Discussion  with patient  not possible secondary to her significant dementia      Granville Lewis, PA-C  07/10/2019

## 2019-07-10 NOTE — Patient Instructions (Signed)
Patient is a long-term resident of facility she is essentially is under pretty much total care because of her significant dementia and comorbidities.  Clinically she appears to be doing well emphasis will be on supporting her quality of life as much as possible to support her nutritional intake which appears to have stabilized her weight is stabilized but this will have to be watched.  Her blood sugars appear to be under decent control on current medications including Lantus NovoLog and Glucophage.  At this point emphasis will be on supportive care with her progressing dementia

## 2019-07-10 NOTE — Progress Notes (Signed)
This encounter was created in error - please disregard.

## 2019-07-14 LAB — BASIC METABOLIC PANEL
BUN: 28 — AB (ref 4–21)
Creatinine: 0.8 (ref 0.5–1.1)
Glucose: 128
Potassium: 5.2 (ref 3.4–5.3)
Sodium: 145 (ref 137–147)

## 2019-07-16 ENCOUNTER — Encounter: Payer: Self-pay | Admitting: Internal Medicine

## 2019-07-16 ENCOUNTER — Non-Acute Institutional Stay (SKILLED_NURSING_FACILITY): Payer: Medicare Other | Admitting: Internal Medicine

## 2019-07-16 DIAGNOSIS — E870 Hyperosmolality and hypernatremia: Secondary | ICD-10-CM

## 2019-07-16 LAB — BASIC METABOLIC PANEL
BUN: 22 — AB (ref 4–21)
Creatinine: 0.9 (ref 0.5–1.1)
Glucose: 155
Potassium: 3.7 (ref 3.4–5.3)
Sodium: 146 (ref 137–147)

## 2019-07-16 NOTE — Progress Notes (Signed)
Location:  [facility] Room Number: 202-W Place of Service:  SNF [phone]) Provider:  Granville Lewis, PA-C  Hennie Duos, MD  Patient Care Team: Hennie Duos, MD as PCP - General (Internal Medicine)  Extended Emergency Contact Information Primary Emergency Contact: [name],[name] Address: [address]          [place], [place] [address] [name] of [place] Phone: [phone] Work Phone: [phone] Mobile Phone: [phone] Relation: Daughter Secondary Emergency Contact: [name],[name]  [place] Phone: [phone] Relation: Niece  Code Status:  DNR Goals of care: Advanced Directive information Advanced Directives 07/10/2019  Does Patient Have a Medical Advance Directive? Yes  Type of Advance Directive Out of facility DNR (pink MOST or yellow form)  Does patient want to make changes to medical advance directive? No - Patient declined  Copy of [place] in Chart? -  Would patient like information on creating a medical advance directive? -  Pre-existing out of facility DNR order (yellow form or pink MOST form) Yellow form placed in chart (order not valid for inpatient use)     Chief Complaint  Patient presents with  . Acute Visit    Patient seen for hypernatremia - sodium 146    HPI:  Pt is a 78 y.o. female seen today for an acute visit secondary to hypernatremia (sodium 146).   She is a long-term resident of facility she does have a history of dementia and hypertension in addition to diastolic CHF type 2 diabetes COPD GERD and coronary artery disease.  .  I did see her earlier this month because her sodium had gone up somewhat to 146- we did hold her Lasix for couple days and it appeared to have moderated into the lower 140s.  However update labs shows as rising again slightly at 146.  In regards to diastolic CHF this appears to be well compensated and weight is stable and I do not really  see any significant edema.  She remains somewhat confused but this is her baseline with dementia she appears to be at her baseline.  She is not complaining of any shortness of breath or chest pain    Past Medical History:  Diagnosis Date  . Allergy to ACE inhibitors 03/17/2015  . Anxiety   . CAD (coronary artery disease)    a. s/p inferior STEMI 12/29/10 with rotablator atherectomy RCA 12/31/10 and DES x 2 RCA;  b. Lexiscan Myoview (02/2015): mild reversible apical anterior perfusion defect. C. cath 02/2015 50% LAD lesion with patent stent, Tx Rx    . Cardiomyopathy, ischemic 03/17/2015  . Chronic diastolic CHF (congestive heart failure) ([place]) 11/12/2016  . COPD (chronic obstructive pulmonary disease) ([place])   . Coronary atherosclerosis of native coronary artery 04/03/2013  . Dementia ([place])   . Depression 03/14/2017  . Diverticulosis   . DM2 (diabetes mellitus, type 2) ([place]) 04/02/2013  . Dysphagia 05/10/2017   Minimal 6/20 - pureed diet  . Fibromyalgia 04/20/2008   Qualifier: Diagnosis of  By: Nelson-Smith CMA (AAMA), Dottie    . GERD (gastroesophageal reflux disease)   . Glaucoma   . HTN (hypertension)   . Hyperlipidemia   . Ischemic cardiomyopathy    EF 45%cath 2012 EF55% 5/12; 35% 11/14  . Memory deficit   . Nephrolithiasis   . Obesity (BMI 30-39.9) 10/06/2015  . Osteoarthritis   . Overweight(278.02)   . Respiratory arrest//ACE Inhibitor presumed cause   . Senile dementia without behavioral  disturbance ([place]) 10/06/2015  . Sleep apnea   . Small bowel obstruction (HCC)    from a sigmoid stricture  . Ventricular tachycardia ([place]) 03/17/2015  . Vitamin D deficiency 01/28/2017   Past Surgical History:  Procedure Laterality Date  . bilateral tubal ligation    . CARDIAC CATHETERIZATION N/A 03/18/2015   Procedure: LEFT HEART CATH AND CORONARY ANGIOGRAPHY;  Surgeon: Lorretta Harp, MD;  Location: [facility] CATH LAB;  Service: Cardiovascular;  Laterality: N/A;  . CARDIAC SURGERY    . HIP  ARTHROPLASTY Right 11/10/2016   Procedure: ARTHROPLASTY BIPOLAR HIP (HEMIARTHROPLASTY);  Surgeon: Marchia Bond, MD;  Location: [facility];  Service: Orthopedics;  Laterality: Right;  . 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 LITHOTRIPSY      Allergies  Allergen Reactions  . Lisinopril Other (See Comments)    Reaction:  Respiratory arrest   . Spironolactone Anaphylaxis  . Vancomycin Anaphylaxis and Rash  . Donepezil Diarrhea  . Eggs Or Egg-Derived Products Diarrhea  . Erythromycin Other (See Comments)    Reaction:  Makes pt feel weird   . Macrolides And Ketolides Other (See Comments)    Reaction:  Unknown   . Penicillins Itching and Other (See Comments)    Has patient had a PCN reaction causing immediate rash, facial/tongue/throat swelling, SOB or lightheadedness with hypotension: No Has patient had a PCN reaction causing severe rash involving mucus membranes or skin necrosis: No Has patient had a PCN reaction that required hospitalization No Has patient had a PCN reaction occurring within the last 10 years: No If all of the above answers are "NO", then may proceed with Cephalosporin use.  . Quinapril Hcl Itching  . Angiotensin Receptor Blockers Other (See Comments)    Reaction:  Unknown   . Lipitor [Atorvastatin] Palpitations  . Pentazocine Lactate Other (See Comments)    Reaction:  Unknown   . Propoxyphene Hcl Other (See Comments)    Reaction unknown  . Sulfonamide Derivatives Other (See Comments)    Reaction:  Unknown     Outpatient Encounter Medications as of 07/16/2019  Medication Sig  . acetaminophen (TYLENOL) 325 MG tablet Take 650 mg by mouth every 6 (six) hours as needed for mild pain or moderate pain.   Marland Kitchen aspirin EC 81 MG tablet Take 81 mg by mouth daily.  Marland Kitchen atorvastatin (LIPITOR) 40 MG tablet Take 40 mg by mouth daily.   . Cholecalciferol (D3-50) 1.25 MG (50000 UT) capsule Take 50,000 Units by mouth once a week.   . clopidogrel (PLAVIX) 75  MG tablet Take 75 mg by mouth daily.   . furosemide (LASIX) 20 MG tablet Take 20 mg by mouth daily.   . insulin glargine (LANTUS) 100 UNIT/ML injection Inject 15 Units into the skin at bedtime. If blood sugar is > 150  . insulin lispro (HUMALOG KWIKPEN) 100 UNIT/ML KiwkPen Inject 4 Units into the skin 3 (three) times daily with meals.   . isosorbide mononitrate (IMDUR) 30 MG 24 hr tablet Take 0.5 tablets (15 mg total) by mouth daily.  . memantine (NAMENDA) 10 MG tablet Take 10 mg by mouth 2 (two) times daily.   . metFORMIN (GLUCOPHAGE) 1000 MG tablet Take 1,000 mg by mouth 2 (two) times daily with a meal.   . metoprolol succinate (TOPROL XL) 25 MG 24 hr tablet Take 0.5 tablets (12.5 mg total) by mouth daily.  . mirtazapine (REMERON) 7.5 MG tablet Take 7.5 mg by mouth at bedtime.   Marland Kitchen  mometasone-formoterol (DULERA) 100-5 MCG/ACT AERO Inhale 2 puffs into the lungs 2 (two) times daily.   . nitroGLYCERIN (NITROSTAT) 0.4 MG SL tablet Place 0.4 mg under the tongue every 5 (five) minutes as needed for chest pain.  . Nutritional Supplement LIQD Take 120 mLs by mouth 2 (two) times a day. MedPass  . NUTRITIONAL SUPPLEMENTS PO Take 1 each by mouth 2 (two) times a day. Magic Cup with lunch and dinner meal  . ondansetron (ZOFRAN) 4 MG tablet Take 4 mg by mouth every 6 (six) hours as needed for nausea or vomiting.  . pantoprazole (PROTONIX) 40 MG tablet Take 40 mg by mouth daily.  . polyethylene glycol (MIRALAX / GLYCOLAX) packet Take 17 g by mouth daily as needed for mild constipation.  . sennosides-docusate sodium (SENOKOT-S) 8.6-50 MG tablet Take 2 tablets by mouth daily.  . traMADol (ULTRAM) 50 MG tablet Take 1 tablet (50 mg total) by mouth 2 (two) times daily. Back pain  . traZODone (DESYREL) 50 MG tablet Take 25 mg by mouth at bedtime.    No facility-administered encounter medications on file as of 07/16/2019.     Review of Systems   Unattainable secondary to dementia please see HPI  Immunization  History  Administered Date(s) Administered  . Influenza Split 08/20/2014  . Influenza, High Dose Seasonal PF 10/06/2013  . Influenza-Unspecified 10/17/2017, 08/29/2018  . PPD Test 11/16/2016, 12/05/2016  . Pneumococcal Conjugate-13 10/17/2017  . Pneumococcal Polysaccharide-23 09/24/2009  . Tdap 10/18/2017   Pertinent  Health Maintenance Due  Topic Date Due  . FOOT EXAM  11/03/1951  . INFLUENZA VACCINE  06/21/2019  . OPHTHALMOLOGY EXAM  09/08/2020 (Originally 11/03/1951)  . HEMOGLOBIN A1C  01/01/2020  . URINE MICROALBUMIN  04/30/2020  . DEXA SCAN  Completed  . PNA vac Low Risk Adult  Completed   Fall Risk  04/19/2018  Falls in the past year? No   Functional Status Survey:    Vitals:   07/16/19 1601  BP: 128/68  Pulse: 68  Resp: 18  Temp: (!) 97.1 F (36.2 C)  TempSrc: Oral  Weight: 124 lb 12.8 oz (56.6 kg)  Height: 5\' 4"  (1.626 m)   Body mass index is 21.42 kg/m. Physical Exam   General this is a pleasant elderly female no distress she remains somewhat confused but pleasant.  Her skin is warm and dry.  Eyes visual acuity appears intact sclera and conjunctive are clear.  Oropharynx appears clear mucous membranes moist she did not open her mouth wide for very long.  Chest is clear to auscultation with somewhat poor respiratory effort there is no labored breathing or congestion that I could note.  Heart is regular rate and rhythm without murmur gallop or rub she does not really have significant edema.  Abdomen soft nontender with positive bowel sounds.  Musculoskeletal is able to move all her extremities x4 at baseline limited exam since she is lying in bed.  Neurologic appears grossly intact her speech is clear cannot appreciate lateralizing findings.  Psych she is oriented to self only with prompting will follow some simple verbal commands she is cooperative with exam not agitated but continues to be somewhat fidgety  Labs reviewed: Recent Labs    07/07/19  07/14/19 07/16/19  NA 142 145 146  K 4.6 5.2 3.7  BUN 22* 28* 22*  CREATININE 0.8 0.8 0.9   Recent Labs    10/14/18 04/03/19  AST 12* 13  ALT 12 5*  ALKPHOS 81 65   Recent  Labs    10/14/18 07/01/19  WBC 9.2 9.0  NEUTROABS  --  5  HGB 13.2 13.2  HCT 39 40  PLT 172 156   Lab Results  Component Value Date   TSH 4.91 10/21/2018   Lab Results  Component Value Date   HGBA1C 6.5 07/01/2019   Lab Results  Component Value Date   CHOL 127 10/14/2018   HDL 37 10/14/2018   LDLCALC 69 10/14/2018   TRIG 106 10/14/2018   CHOLHDL 2 12/19/2012    Significant Diagnostic Results in last 30 days:  No results found.  Assessment/Plan  #1 mild hypernatremia- clinically she appears to be doing well- sodium is rising again a bit will hold her Lasix again for a couple days and then restart at 20 mg a day- and update a BMP early next week-at that point considering lab results possibly consider making Lasix every other day dose-again will hold Lasix for a few days update a metabolic panel next week and make decisions based on that-.  CPT [phone]

## 2019-07-17 ENCOUNTER — Encounter: Payer: Self-pay | Admitting: Internal Medicine

## 2019-07-18 ENCOUNTER — Other Ambulatory Visit: Payer: Self-pay | Admitting: Internal Medicine

## 2019-07-18 MED ORDER — TRAMADOL HCL 50 MG PO TABS: 50.0000 mg | ORAL_TABLET | Freq: Two times a day (BID) | ORAL | 0 refills | Status: DC

## 2019-07-21 ENCOUNTER — Encounter: Payer: Self-pay | Admitting: Internal Medicine

## 2019-07-21 ENCOUNTER — Non-Acute Institutional Stay (SKILLED_NURSING_FACILITY): Payer: Medicare Other | Admitting: Internal Medicine

## 2019-07-21 ENCOUNTER — Other Ambulatory Visit: Payer: Self-pay | Admitting: Internal Medicine

## 2019-07-21 DIAGNOSIS — E870 Hyperosmolality and hypernatremia: Secondary | ICD-10-CM

## 2019-07-21 LAB — BASIC METABOLIC PANEL
BUN: 24 — AB (ref 4–21)
Creatinine: 0.8 (ref 0.5–1.1)
Glucose: 126
Potassium: 4.4 (ref 3.4–5.3)
Sodium: 141 (ref 137–147)

## 2019-07-21 NOTE — Progress Notes (Signed)
Location:   [facility] Room Number: 202-W Place of Service:  SNF [phone]) Provider:  Henreitta Leber, MD  Patient Care Team: Hennie Duos, MD as PCP - General (Internal Medicine)  Extended Emergency Contact Information Primary Emergency Contact: [name],[name] Address: [address]          [place], [place] [address] [name] of [place] Phone: [phone] Work Phone: [phone] Mobile Phone: [phone] Relation: Daughter Secondary Emergency Contact: [name],[name]  [place] Phone: [phone] Relation: Niece  Code Status:  DNR Goals of care: Advanced Directive information Advanced Directives 07/16/2019  Does Patient Have a Medical Advance Directive? Yes  Type of Advance Directive Out of facility DNR (pink MOST or yellow form)  Does patient want to make changes to medical advance directive? No - Guardian declined  Copy of [place] in Chart? -  Would patient like information on creating a medical advance directive? -  Pre-existing out of facility DNR order (yellow form or pink MOST form) Yellow form placed in chart (order not valid for inpatient use)     Chief Complaint  Patient presents with  . Acute Visit    Patient is seen to follow up on hypernatremia.     HPI:  Pt is a 78 y.o. female seen today for an acute visit to follow up on hypernatremia.  She does have a history of mild intermittent hypernatremia and this was the case last week her sodium was up a bit at 146.  She is a long-term resident of the facility with a history of dementia as well as hypertension and diastolic CHF type 2 diabetes COPD GERD and coronary artery disease.  Earlier this month she also had a sodium 146 we held her Lasix for couple days and it appeared to have helped with her sodium is going down into the lower 140s.  We did the same thing late last week and held her Lasix for couple days and her sodium has  improved down to 141 on today's lab.  She does not really have significant edema despite being off the Lasix for a few days.  Her weight is listed as 127.4 this is relatively stable with what it was approximately 2 weeks ago and does not appear precipitously increased.  She is a poor historian secondary to dementia but does not really have any complaints today she is resting in bed comfortably   Past Medical History:  Diagnosis Date  . Allergy to ACE inhibitors 03/17/2015  . Anxiety   . CAD (coronary artery disease)    a. s/p inferior STEMI 12/29/10 with rotablator atherectomy RCA 12/31/10 and DES x 2 RCA;  b. Lexiscan Myoview (02/2015): mild reversible apical anterior perfusion defect. C. cath 02/2015 50% LAD lesion with patent stent, Tx Rx    . Cardiomyopathy, ischemic 03/17/2015  . Chronic diastolic CHF (congestive heart failure) ([place]) 11/12/2016  . COPD (chronic obstructive pulmonary disease) ([place])   . Coronary atherosclerosis of native coronary artery 04/03/2013  . Dementia ([place])   . Depression 03/14/2017  . Diverticulosis   . DM2 (diabetes mellitus, type 2) ([place]) 04/02/2013  . Dysphagia 05/10/2017   Minimal 6/20 - pureed diet  . Fibromyalgia 04/20/2008   Qualifier: Diagnosis of  By: Nelson-Smith CMA (AAMA), Dottie    . GERD (gastroesophageal reflux disease)   . Glaucoma   . HTN (hypertension)   . Hyperlipidemia   . Ischemic cardiomyopathy    EF 45%cath  2012 EF55% 5/12; 35% 11/14  . Memory deficit   . Nephrolithiasis   . Obesity (BMI 30-39.9) 10/06/2015  . Osteoarthritis   . Overweight(278.02)   . Respiratory arrest//ACE Inhibitor presumed cause   . Senile dementia without behavioral disturbance ([place]) 10/06/2015  . Sleep apnea   . Small bowel obstruction (HCC)    from a sigmoid stricture  . Ventricular tachycardia ([place]) 03/17/2015  . Vitamin D deficiency 01/28/2017   Past Surgical History:  Procedure Laterality Date  . bilateral tubal ligation    . CARDIAC CATHETERIZATION N/A  03/18/2015   Procedure: LEFT HEART CATH AND CORONARY ANGIOGRAPHY;  Surgeon: Lorretta Harp, MD;  Location: [facility] CATH LAB;  Service: Cardiovascular;  Laterality: N/A;  . CARDIAC SURGERY    . HIP ARTHROPLASTY Right 11/10/2016   Procedure: ARTHROPLASTY BIPOLAR HIP (HEMIARTHROPLASTY);  Surgeon: Marchia Bond, MD;  Location: [facility];  Service: Orthopedics;  Laterality: Right;  . 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 LITHOTRIPSY      Allergies  Allergen Reactions  . Lisinopril Other (See Comments)    Reaction:  Respiratory arrest   . Spironolactone Anaphylaxis  . Vancomycin Anaphylaxis and Rash  . Donepezil Diarrhea  . Eggs Or Egg-Derived Products Diarrhea  . Erythromycin Other (See Comments)    Reaction:  Makes pt feel weird   . Macrolides And Ketolides Other (See Comments)    Reaction:  Unknown   . Penicillins Itching and Other (See Comments)    Has patient had a PCN reaction causing immediate rash, facial/tongue/throat swelling, SOB or lightheadedness with hypotension: No Has patient had a PCN reaction causing severe rash involving mucus membranes or skin necrosis: No Has patient had a PCN reaction that required hospitalization No Has patient had a PCN reaction occurring within the last 10 years: No If all of the above answers are "NO", then may proceed with Cephalosporin use.  . Quinapril Hcl Itching  . Angiotensin Receptor Blockers Other (See Comments)    Reaction:  Unknown   . Lipitor [Atorvastatin] Palpitations  . Pentazocine Lactate Other (See Comments)    Reaction:  Unknown   . Propoxyphene Hcl Other (See Comments)    Reaction unknown  . Sulfonamide Derivatives Other (See Comments)    Reaction:  Unknown     Outpatient Encounter Medications as of 07/21/2019  Medication Sig  . acetaminophen (TYLENOL) 325 MG tablet Take 650 mg by mouth every 6 (six) hours as needed for mild pain or moderate pain.   Marland Kitchen aspirin EC 81 MG tablet Take 81 mg by mouth  daily.  Marland Kitchen atorvastatin (LIPITOR) 40 MG tablet Take 40 mg by mouth daily.   . Cholecalciferol (D3-50) 1.25 MG (50000 UT) capsule Take 50,000 Units by mouth once a week.   . clopidogrel (PLAVIX) 75 MG tablet Take 75 mg by mouth daily.   . furosemide (LASIX) 20 MG tablet Take 20 mg by mouth daily.   . insulin glargine (LANTUS) 100 UNIT/ML injection Inject 15 Units into the skin at bedtime. If blood sugar is > 150  . insulin lispro (HUMALOG KWIKPEN) 100 UNIT/ML KiwkPen Inject 4 Units into the skin 3 (three) times daily with meals.   . isosorbide mononitrate (IMDUR) 30 MG 24 hr tablet Take 0.5 tablets (15 mg total) by mouth daily.  . memantine (NAMENDA) 10 MG tablet Take 10 mg by mouth 2 (two) times daily.   . metFORMIN (GLUCOPHAGE) 1000 MG tablet Take 1,000 mg by mouth 2 (two) times  daily with a meal.   . metoprolol succinate (TOPROL XL) 25 MG 24 hr tablet Take 0.5 tablets (12.5 mg total) by mouth daily.  . mirtazapine (REMERON) 7.5 MG tablet Take 7.5 mg by mouth at bedtime.   . mometasone-formoterol (DULERA) 100-5 MCG/ACT AERO Inhale 2 puffs into the lungs 2 (two) times daily.   . nitroGLYCERIN (NITROSTAT) 0.4 MG SL tablet Place 0.4 mg under the tongue every 5 (five) minutes as needed for chest pain.  . Nutritional Supplement LIQD Take 120 mLs by mouth 2 (two) times a day. MedPass  . NUTRITIONAL SUPPLEMENTS PO Take 1 each by mouth 2 (two) times a day. Magic Cup with lunch and dinner meal  . ondansetron (ZOFRAN) 4 MG tablet Take 4 mg by mouth every 6 (six) hours as needed for nausea or vomiting.  . pantoprazole (PROTONIX) 40 MG tablet Take 40 mg by mouth daily.  . polyethylene glycol (MIRALAX / GLYCOLAX) packet Take 17 g by mouth daily as needed for mild constipation.  . sennosides-docusate sodium (SENOKOT-S) 8.6-50 MG tablet Take 2 tablets by mouth daily.  . traMADol (ULTRAM) 50 MG tablet Take 1 tablet (50 mg total) by mouth 2 (two) times daily. Back pain  . traZODone (DESYREL) 50 MG tablet Take 25  mg by mouth at bedtime.    No facility-administered encounter medications on file as of 07/21/2019.     Review of Systems  Is largely unobtainable secondary to dementia please see HPI--nursing does not report any concerns  Immunization History  Administered Date(s) Administered  . Influenza Split 08/20/2014  . Influenza, High Dose Seasonal PF 10/06/2013  . Influenza-Unspecified 10/17/2017, 08/29/2018  . PPD Test 11/16/2016, 12/05/2016  . Pneumococcal Conjugate-13 10/17/2017  . Pneumococcal Polysaccharide-23 09/24/2009  . Tdap 10/18/2017   Pertinent  Health Maintenance Due  Topic Date Due  . FOOT EXAM  11/03/1951  . INFLUENZA VACCINE  06/21/2019  . OPHTHALMOLOGY EXAM  09/08/2020 (Originally 11/03/1951)  . HEMOGLOBIN A1C  01/01/2020  . URINE MICROALBUMIN  04/30/2020  . DEXA SCAN  Completed  . PNA vac Low Risk Adult  Completed   Fall Risk  04/19/2018  Falls in the past year? No   Functional Status Survey:    Vitals:   07/21/19 1604  BP: 125/76  Pulse: 76  Resp: 18  Temp: 97.6 F (36.4 C)  TempSrc: Oral  Weight: 127 lb 6.4 oz (57.8 kg)  Height: 5\' 4"  (1.626 m)   Body mass index is 21.87 kg/m. Physical Exam   In general this is a pleasant elderly female no distress resting in bed comfortably she was sleeping but easily arousable.  Her skin is warm and dry.  Eyes visual acuity appears to be intact sclera and conjunctive are clear.  Oropharynx was somewhat difficult to assess since she did not really open her mouth very wide.  Chest is clear to auscultation there is no labored breathing respiratory effort was somewhat poor but could not appreciate any congestion.  Heart is regular rate and rhythm without murmur gallop or rub she does not really have significant edema.  Her abdomen is soft nontender with positive bowel sounds.  Musculoskeletal does move all her extremities x4 it appears at baseline this is somewhat limited since she is in bed but appears to be  within her baseline.  Neurologic is grossly intact her speech is clear could not really appreciate true lateralizing findings.  Psych she is oriented to self she will follow simple verbal commands at times  Labs  reviewed:  July 21, 2019.  Sodium 141 potassium 4.4 BUN 24.3-creatinine 0.81   Recent Labs    07/07/19 07/14/19 07/16/19  NA 142 145 146  K 4.6 5.2 3.7  BUN 22* 28* 22*  CREATININE 0.8 0.8 0.9   Recent Labs    10/14/18 04/03/19  AST 12* 13  ALT 12 5*  ALKPHOS 81 65   Recent Labs    10/14/18 07/01/19  WBC 9.2 9.0  NEUTROABS  --  5  HGB 13.2 13.2  HCT 39 40  PLT 172 156   Lab Results  Component Value Date   TSH 4.91 10/21/2018   Lab Results  Component Value Date   HGBA1C 6.5 07/01/2019   Lab Results  Component Value Date   CHOL 127 10/14/2018   HDL 37 10/14/2018   LDLCALC 69 10/14/2018   TRIG 106 10/14/2018   CHOLHDL 2 12/19/2012    Significant Diagnostic Results in last 30 days:  No results found.  Assessment/Plan #1 history of hypernatremia-again her Lasix was held for couple days last week this appears again to have helped bring her sodium down to 141- at this point we will continue to monitor she does not show signs of increased edema despite being off the Lasix-we will update a BMP next week to ensure stability  [id number]     Granville Lewis, PA-C [phone]

## 2019-07-25 ENCOUNTER — Encounter: Payer: Self-pay | Admitting: Internal Medicine

## 2019-07-25 ENCOUNTER — Non-Acute Institutional Stay (SKILLED_NURSING_FACILITY): Payer: Medicare Other | Admitting: Internal Medicine

## 2019-07-25 DIAGNOSIS — I5032 Chronic diastolic (congestive) heart failure: Secondary | ICD-10-CM

## 2019-07-25 DIAGNOSIS — J449 Chronic obstructive pulmonary disease, unspecified: Secondary | ICD-10-CM | POA: Diagnosis not present

## 2019-07-25 DIAGNOSIS — K219 Gastro-esophageal reflux disease without esophagitis: Secondary | ICD-10-CM | POA: Diagnosis not present

## 2019-07-25 NOTE — Progress Notes (Signed)
Location:  [facility] Room Number: 202-W Place of Service:  SNF (31)  Hennie Duos, MD  Patient Care Team: Hennie Duos, MD as PCP - General (Internal Medicine)  Extended Emergency Contact Information Primary Emergency Contact: [name],[name] Address: [address]          [place], [place] [address] [name] of [place] Phone: [phone] Work Phone: [phone] Mobile Phone: [phone] Relation: Daughter Secondary Emergency Contact: [name],[name]  [place] Phone: [phone] Relation: Niece    Allergies: Lisinopril, Spironolactone, Vancomycin, Donepezil, Eggs or egg-derived products, Erythromycin, Macrolides and ketolides, Penicillins, Quinapril hcl, Angiotensin receptor blockers, Lipitor [atorvastatin], Pentazocine lactate, Propoxyphene hcl, and Sulfonamide derivatives  Chief Complaint  Patient presents with  . Medical Management of Chronic Issues    Routine [facility] visit    HPI: Patient is a 78 y.o. female who is being seen for routine issues diastolic congestive heart failure, COPD, and GERD.  Past Medical History:  Diagnosis Date  . Allergy to ACE inhibitors 03/17/2015  . Anxiety   . CAD (coronary artery disease)    a. s/p inferior STEMI 12/29/10 with rotablator atherectomy RCA 12/31/10 and DES x 2 RCA;  b. Lexiscan Myoview (02/2015): mild reversible apical anterior perfusion defect. C. cath 02/2015 50% LAD lesion with patent stent, Tx Rx    . Cardiomyopathy, ischemic 03/17/2015  . Chronic diastolic CHF (congestive heart failure) ([place]) 11/12/2016  . COPD (chronic obstructive pulmonary disease) ([place])   . Coronary atherosclerosis of native coronary artery 04/03/2013  . Dementia ([place])   . Depression 03/14/2017  . Diverticulosis   . DM2 (diabetes mellitus, type 2) ([place]) 04/02/2013  . Dysphagia 05/10/2017   Minimal 6/20 - pureed diet  . Fibromyalgia 04/20/2008   Qualifier: Diagnosis of  By:  Nelson-Smith CMA (AAMA), Dottie    . GERD (gastroesophageal reflux disease)   . Glaucoma   . HTN (hypertension)   . Hyperlipidemia   . Ischemic cardiomyopathy    EF 45%cath 2012 EF55% 5/12; 35% 11/14  . Memory deficit   . Nephrolithiasis   . Obesity (BMI 30-39.9) 10/06/2015  . Osteoarthritis   . Overweight(278.02)   . Respiratory arrest//ACE Inhibitor presumed cause   . Senile dementia without behavioral disturbance ([place]) 10/06/2015  . Sleep apnea   . Small bowel obstruction (HCC)    from a sigmoid stricture  . Ventricular tachycardia ([place]) 03/17/2015  . Vitamin D deficiency 01/28/2017    Past Surgical History:  Procedure Laterality Date  . bilateral tubal ligation    . CARDIAC CATHETERIZATION N/A 03/18/2015   Procedure: LEFT HEART CATH AND CORONARY ANGIOGRAPHY;  Surgeon: Lorretta Harp, MD;  Location: [facility] CATH LAB;  Service: Cardiovascular;  Laterality: N/A;  . CARDIAC SURGERY    . HIP ARTHROPLASTY Right 11/10/2016   Procedure: ARTHROPLASTY BIPOLAR HIP (HEMIARTHROPLASTY);  Surgeon: Marchia Bond, MD;  Location: [facility];  Service: Orthopedics;  Laterality: Right;  . 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 LITHOTRIPSY      Allergies as of 07/25/2019      Reactions   Lisinopril Other (See Comments)   Reaction:  Respiratory arrest    Spironolactone Anaphylaxis   Vancomycin Anaphylaxis, Rash   Donepezil Diarrhea   Eggs Or Egg-derived Products Diarrhea   Erythromycin Other (See Comments)   Reaction:  Makes pt feel weird    Macrolides And Ketolides Other (See Comments)   Reaction:  Unknown    Penicillins  Itching, Other (See Comments)   Has patient had a PCN reaction causing immediate rash, facial/tongue/throat swelling, SOB or lightheadedness with hypotension: No Has patient had a PCN reaction causing severe rash involving mucus membranes or skin necrosis: No Has patient had a PCN reaction that required hospitalization No Has patient had a PCN  reaction occurring within the last 10 years: No If all of the above answers are "NO", then may proceed with Cephalosporin use.   Quinapril Hcl Itching   Angiotensin Receptor Blockers Other (See Comments)   Reaction:  Unknown    Lipitor [atorvastatin] Palpitations   Pentazocine Lactate Other (See Comments)   Reaction:  Unknown    Propoxyphene Hcl Other (See Comments)   Reaction unknown   Sulfonamide Derivatives Other (See Comments)   Reaction:  Unknown       Medication List       Accurate as of July 25, 2019 11:59 PM. If you have any questions, ask your nurse or doctor.        STOP taking these medications   furosemide 20 MG tablet Commonly known as: LASIX Stopped by: Inocencio Homes, MD     TAKE these medications   acetaminophen 325 MG tablet Commonly known as: TYLENOL Take 650 mg by mouth every 6 (six) hours as needed for mild pain or moderate pain.   aspirin EC 81 MG tablet Take 81 mg by mouth daily.   atorvastatin 40 MG tablet Commonly known as: LIPITOR Take 40 mg by mouth daily.   clopidogrel 75 MG tablet Commonly known as: PLAVIX Take 75 mg by mouth daily.   D3-50 1.25 MG (50000 UT) capsule Generic drug: Cholecalciferol Take 50,000 Units by mouth once a week.   HumaLOG KwikPen 100 UNIT/ML KiwkPen Generic drug: insulin lispro Inject 4 Units into the skin 3 (three) times daily with meals.   insulin glargine 100 UNIT/ML injection Commonly known as: LANTUS Inject 15 Units into the skin at bedtime. If blood sugar is > 150   isosorbide mononitrate 30 MG 24 hr tablet Commonly known as: IMDUR Take 0.5 tablets (15 mg total) by mouth daily.   memantine 10 MG tablet Commonly known as: NAMENDA Take 10 mg by mouth 2 (two) times daily.   metFORMIN 1000 MG tablet Commonly known as: GLUCOPHAGE Take 1,000 mg by mouth 2 (two) times daily with a meal.   metoprolol succinate 25 MG 24 hr tablet Commonly known as: Toprol XL Take 0.5 tablets (12.5 mg total) by  mouth daily.   mirtazapine 7.5 MG tablet Commonly known as: REMERON Take 7.5 mg by mouth at bedtime.   mometasone-formoterol 100-5 MCG/ACT Aero Commonly known as: DULERA Inhale 2 puffs into the lungs 2 (two) times daily.   nitroGLYCERIN 0.4 MG SL tablet Commonly known as: NITROSTAT Place 0.4 mg under the tongue every 5 (five) minutes as needed for chest pain.   NUTRITIONAL SUPPLEMENTS PO Take 1 each by mouth 2 (two) times a day. Magic Cup with lunch and dinner meal   Nutritional Supplement Liqd Take 120 mLs by mouth 2 (two) times a day. MedPass   ondansetron 4 MG tablet Commonly known as: ZOFRAN Take 4 mg by mouth every 6 (six) hours as needed for nausea or vomiting.   pantoprazole 40 MG tablet Commonly known as: PROTONIX Take 40 mg by mouth daily.   polyethylene glycol 17 g packet Commonly known as: MIRALAX / GLYCOLAX Take 17 g by mouth daily as needed for mild constipation.   sennosides-docusate sodium 8.6-50 MG  tablet Commonly known as: SENOKOT-S Take 2 tablets by mouth daily.   traMADol 50 MG tablet Commonly known as: ULTRAM Take 1 tablet (50 mg total) by mouth 2 (two) times daily. Back pain   traZODone 50 MG tablet Commonly known as: DESYREL Take 25 mg by mouth at bedtime.       No orders of the defined types were placed in this encounter.   Immunization History  Administered Date(s) Administered  . Influenza Split 08/20/2014  . Influenza, High Dose Seasonal PF 10/06/2013  . Influenza-Unspecified 10/17/2017, 08/29/2018  . PPD Test 11/16/2016, 12/05/2016  . Pneumococcal Conjugate-13 10/17/2017  . Pneumococcal Polysaccharide-23 09/24/2009  . Tdap 10/18/2017    Social History   Tobacco Use  . Smoking status: Former Smoker    Packs/day: 1.00    Years: 25.00    Pack years: 25.00    Types: Cigarettes  . Smokeless tobacco: Never Used  . Tobacco comment: over a ppd for 40+ years; quit 10/2004.  unsuccessfully tried eCigs.  Substance Use Topics  .  Alcohol use: No    Alcohol/week: 1.0 standard drinks    Types: 1 Standard drinks or equivalent per week    Review of Systems  DATA OBTAINED: from patient-limited; nursing GENERAL:  no fevers, fatigue, appetite changes SKIN: No itching, rash HEENT: No complaint RESPIRATORY: No cough, wheezing, SOB CARDIAC: No chest pain, palpitations, lower extremity edema  GI: No abdominal pain, No N/V/D or constipation, No heartburn or reflux  GU: No dysuria, frequency or urgency, or incontinence  MUSCULOSKELETAL: No unrelieved bone/joint pain NEUROLOGIC: No headache, dizziness  PSYCHIATRIC: No overt anxiety or sadness  Vitals:   07/25/19 1125  BP: (!) 142/82  Pulse: 85  Resp: 20  Temp: 98.3 F (36.8 C)   Body mass index is 21.87 kg/m. Physical Exam  GENERAL APPEARANCE: Alert, minimally conversant, No acute distress  SKIN: No diaphoresis rash HEENT: Unremarkable RESPIRATORY: Breathing is even, unlabored. Lung sounds are clear   CARDIOVASCULAR: Heart RRR no murmurs, rubs or gallops. No peripheral edema  GASTROINTESTINAL: Abdomen is soft, non-tender, not distended w/ normal bowel sounds.  GENITOURINARY: Bladder non tender, not distended  MUSCULOSKELETAL: No abnormal joints or musculature NEUROLOGIC: Cranial nerves 2-12 grossly intact. Moves all extremities PSYCHIATRIC: Dementia, no behavioral issues  Patient Active Problem List   Diagnosis Date Noted  . Hypertension associated with type 2 diabetes mellitus ([place]) 03/06/2019  . Dyslipidemia associated with type 2 diabetes mellitus ([place]) 03/06/2019  . Acquired hypothyroidism 03/06/2019  . Vascular dementia without behavioral disturbance ([place]) 03/06/2019  . Insomnia 08/06/2018  . GERD (gastroesophageal reflux disease) 08/04/2017  . Dysphagia 05/10/2017  . Depression 03/14/2017  . Vitamin D deficiency 01/28/2017  . UTI due to extended-spectrum beta lactamase (ESBL) producing Escherichia coli 11/18/2016  . Urinary tract infection due to  Proteus 11/18/2016  . Postoperative anemia due to acute blood loss 11/18/2016  . HTN (hypertension) 11/18/2016  . Chronic diastolic CHF (congestive heart failure) ([place]) 11/12/2016  . Closed right hip fracture ([place]) 11/10/2016  . Senile dementia without behavioral disturbance ([place]) 10/06/2015  . Obesity (BMI 30-39.9) 10/06/2015  . Tobacco use disorder 03/17/2015  . Cardiomyopathy, ischemic 03/17/2015  . Allergy to ACE inhibitors 03/17/2015  . Ventricular tachycardia ([place]) 03/17/2015  . HLD (hyperlipidemia) 12/31/2014  . COPD (chronic obstructive pulmonary disease) ([place]) 12/31/2014  . Chronic cough 09/12/2013  . Memory deficit 04/23/2013  . Essential hypertension, benign 04/03/2013  . Dyslipidemia 04/03/2013  . Coronary atherosclerosis of native coronary artery 04/03/2013  .  DM2 (diabetes mellitus, type 2) ([place]) 04/02/2013  . Fibromyalgia 04/20/2008    CMP     Component Value Date/Time   NA 146 07/16/2019   K 3.7 07/16/2019   CL 103 11/16/2016 0522   CO2 31 11/16/2016 0522   GLUCOSE 220 (H) 11/16/2016 0522   BUN 22 (A) 07/16/2019   CREATININE 0.9 07/16/2019   CREATININE 0.77 11/16/2016 0522   CALCIUM 8.2 (L) 11/16/2016 0522   PROT 7.7 04/27/2016 1724   ALBUMIN 4.4 04/27/2016 1724   AST 13 04/03/2019   ALT 5 (A) 04/03/2019   ALKPHOS 65 04/03/2019   BILITOT 1.2 04/27/2016 1724   GFRNONAA >60 11/16/2016 0522   GFRAA >60 11/16/2016 0522   Recent Labs    07/07/19 07/14/19 07/16/19  NA 142 145 146  K 4.6 5.2 3.7  BUN 22* 28* 22*  CREATININE 0.8 0.8 0.9   Recent Labs    10/14/18 04/03/19  AST 12* 13  ALT 12 5*  ALKPHOS 81 65   Recent Labs    10/14/18 07/01/19  WBC 9.2 9.0  NEUTROABS  --  5  HGB 13.2 13.2  HCT 39 40  PLT 172 156   Recent Labs    10/14/18  CHOL 127  LDLCALC 69  TRIG 106   Lab Results  Component Value Date   MICROALBUR 1.2 05/01/2019   Lab Results  Component Value Date   TSH 4.91 10/21/2018   Lab Results  Component Value Date    HGBA1C 6.5 07/01/2019   Lab Results  Component Value Date   CHOL 127 10/14/2018   HDL 37 10/14/2018   LDLCALC 69 10/14/2018   TRIG 106 10/14/2018   CHOLHDL 2 12/19/2012    Significant Diagnostic Results in last 30 days:  No results found.  Assessment and Plan  Chronic diastolic CHF (congestive heart failure) (HCC) Stable without exacerbations; continue Toprol-XL 12.5 mg daily and Lasix 40 mg daily  COPD (chronic obstructive pulmonary disease) (HCC) No recent exacerbations; continue Dulera 100- 5 2 puffs twice daily  GERD (gastroesophageal reflux disease) No reports of aspiration or reflux; continue Protonix 40 mg daily      Hennie Duos, MD

## 2019-07-28 ENCOUNTER — Encounter: Payer: Self-pay | Admitting: Internal Medicine

## 2019-07-28 LAB — BASIC METABOLIC PANEL
BUN: 27 — AB (ref 4–21)
Creatinine: 0.8 (ref 0.5–1.1)
Glucose: 133
Potassium: 4.2 (ref 3.4–5.3)
Sodium: 144 (ref 137–147)

## 2019-07-28 NOTE — Assessment & Plan Note (Signed)
No recent exacerbations; continue Dulera 100- 5 2 puffs twice daily

## 2019-07-28 NOTE — Assessment & Plan Note (Signed)
Stable without exacerbations; continue Toprol-XL 12.5 mg daily and Lasix 40 mg daily

## 2019-07-28 NOTE — Assessment & Plan Note (Signed)
No reports of aspiration or reflux; continue Protonix 40 mg daily

## 2019-08-18 ENCOUNTER — Other Ambulatory Visit: Payer: Self-pay | Admitting: Internal Medicine

## 2019-08-18 MED ORDER — TRAMADOL HCL 50 MG PO TABS: 50.0000 mg | ORAL_TABLET | Freq: Two times a day (BID) | ORAL | 0 refills | Status: DC

## 2019-08-25 ENCOUNTER — Encounter: Payer: Self-pay | Admitting: Internal Medicine

## 2019-08-25 ENCOUNTER — Non-Acute Institutional Stay (SKILLED_NURSING_FACILITY): Payer: Medicare Other | Admitting: Internal Medicine

## 2019-08-25 DIAGNOSIS — I25118 Atherosclerotic heart disease of native coronary artery with other forms of angina pectoris: Secondary | ICD-10-CM

## 2019-08-25 DIAGNOSIS — Z794 Long term (current) use of insulin: Secondary | ICD-10-CM

## 2019-08-25 DIAGNOSIS — I5032 Chronic diastolic (congestive) heart failure: Secondary | ICD-10-CM | POA: Diagnosis not present

## 2019-08-25 DIAGNOSIS — F0150 Vascular dementia without behavioral disturbance: Secondary | ICD-10-CM

## 2019-08-25 DIAGNOSIS — E1159 Type 2 diabetes mellitus with other circulatory complications: Secondary | ICD-10-CM | POA: Diagnosis not present

## 2019-08-25 DIAGNOSIS — J449 Chronic obstructive pulmonary disease, unspecified: Secondary | ICD-10-CM

## 2019-08-25 NOTE — Progress Notes (Signed)
Location:  [facility] Room Number: 202-W Place of Service:  SNF [phone]) Provider:  Granville Lewis, PA-C  Hennie Duos, MD  Patient Care Team: Hennie Duos, MD as PCP - General (Internal Medicine)  Extended Emergency Contact Information Primary Emergency Contact: [name],[name] Address: [address]          [place], [place] [address] [name] of [place] Phone: [phone] Work Phone: [phone] Mobile Phone: [phone] Relation: Daughter Secondary Emergency Contact: [name],[name]  [place] Phone: [phone] Relation: Niece  Code Status:  DNR Goals of care: Advanced Directive information Advanced Directives 07/25/2019  Does Patient Have a Medical Advance Directive? Yes  Type of Advance Directive Out of facility DNR (pink MOST or yellow form)  Does patient want to make changes to medical advance directive? No - Patient declined  Copy of [place] in Chart? -  Would patient like information on creating a medical advance directive? -  Pre-existing out of facility DNR order (yellow form or pink MOST form) Yellow form placed in chart (order not valid for inpatient use)      Chief Complaint  Patient presents with  . Medical Management of Chronic Issues    Routine [facility] visit  Medical management of chronic medical conditions including dementia-diastolic CHF-COPD-as well as GERD-history of hypernatremia- type 2 diabetes-hypertension  HPI:  Pt is a 78 y.o. female seen today for medical management of chronic diseases.  As noted above.  Nursing does not report any recent acute issues.  At times she will develop some mild hypernatremia and we will hold her Lasix for couple days and this usually normalizes.  Patient has significant dementia and cannot really give any review of systems but clinically appears to be stable she is about to eat her lunch today.  She  does have a history of  diastolic CHF but appears to be well compensated her weight at 126 appears to be relatively stable recently she is on Toprol-XL 12.5 mg a day as well as Lasix 20 mg a day.  Regards to COPD she is on Dulera twice a day and this appears to be stable.  She is a type II diabetic she is on Lantus 15 units a day as well as Glucophage thousand milligrams twice daily and Humalog 4 units with meals blood sugars appear to average more in the mid 100s although there is some variability hemoglobin A1c last month did show stability at 6.2.  In regards to hypertension she continues again on Toprol-XL 12.5 mg a day as well as Lasix 20 mg a day and Imdur 15 mg a day blood pressures appear relatively stable 101/63-118/70 most recently range systolically low-dose from 100-157 but I do not see consistent elevations or readings below [id number] systolically.       Past Medical History:  Diagnosis Date  . Allergy to ACE inhibitors 03/17/2015  . Anxiety   . CAD (coronary artery disease)    a. s/p inferior STEMI 12/29/10 with rotablator atherectomy RCA 12/31/10 and DES x 2 RCA;  b. Lexiscan Myoview (02/2015): mild reversible apical anterior perfusion defect. C. cath 02/2015 50% LAD lesion with patent stent, Tx Rx    . Cardiomyopathy, ischemic 03/17/2015  . Chronic diastolic CHF (congestive heart failure) ([facility]) 11/12/2016  . COPD (chronic obstructive pulmonary disease) ([facility])   . Coronary atherosclerosis of native coronary artery 04/03/2013  . Dementia ([facility])   . Depression 03/14/2017  . Diverticulosis   .  DM2 (diabetes mellitus, type 2) ([facility]) 04/02/2013  . Dysphagia 05/10/2017   Minimal 6/20 - pureed diet  . Fibromyalgia 04/20/2008   Qualifier: Diagnosis of  By: Nelson-Smith CMA (AAMA), Dottie    . GERD (gastroesophageal reflux disease)   . Glaucoma   . HTN (hypertension)   . Hyperlipidemia   . Ischemic cardiomyopathy    EF 45%cath 2012 EF55% 5/12; 35% 11/14  . Memory deficit   . Nephrolithiasis   . Obesity (BMI 30-39.9)  10/06/2015  . Osteoarthritis   . Overweight(278.02)   . Respiratory arrest//ACE Inhibitor presumed cause   . Senile dementia without behavioral disturbance ([facility]) 10/06/2015  . Sleep apnea   . Small bowel obstruction ([facility])    from a sigmoid stricture  . Ventricular tachycardia ([facility]) 03/17/2015  . Vitamin D deficiency 01/28/2017   Past Surgical History:  Procedure Laterality Date  . bilateral tubal ligation    . CARDIAC CATHETERIZATION N/A 03/18/2015   Procedure: LEFT HEART CATH AND CORONARY ANGIOGRAPHY;  Surgeon: Lorretta Harp, MD;  Location: [facility] CATH LAB;  Service: Cardiovascular;  Laterality: N/A;  . CARDIAC SURGERY    . HIP ARTHROPLASTY Right 11/10/2016   Procedure: ARTHROPLASTY BIPOLAR HIP (HEMIARTHROPLASTY);  Surgeon: Marchia Bond, MD;  Location: [facility];  Service: Orthopedics;  Laterality: Right;  . 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 LITHOTRIPSY      Allergies  Allergen Reactions  . Lisinopril Other (See Comments)    Reaction:  Respiratory arrest   . Spironolactone Anaphylaxis  . Vancomycin Anaphylaxis and Rash  . Donepezil Diarrhea  . Eggs Or Egg-Derived Products Diarrhea  . Erythromycin Other (See Comments)    Reaction:  Makes pt feel weird   . Macrolides And Ketolides Other (See Comments)    Reaction:  Unknown   . Penicillins Itching and Other (See Comments)    Has patient had a PCN reaction causing immediate rash, facial/tongue/throat swelling, SOB or lightheadedness with hypotension: No Has patient had a PCN reaction causing severe rash involving mucus membranes or skin necrosis: No Has patient had a PCN reaction that required hospitalization No Has patient had a PCN reaction occurring within the last 10 years: No If all of the above answers are "NO", then may proceed with Cephalosporin use.  . Quinapril Hcl Itching  . Angiotensin Receptor Blockers Other (See Comments)    Reaction:  Unknown   . Lipitor [Atorvastatin] Palpitations   . Pentazocine Lactate Other (See Comments)    Reaction:  Unknown   . Propoxyphene Hcl Other (See Comments)    Reaction unknown  . Sulfonamide Derivatives Other (See Comments)    Reaction:  Unknown     Outpatient Encounter Medications as of 08/25/2019  Medication Sig  . acetaminophen (TYLENOL) 325 MG tablet Take 650 mg by mouth every 6 (six) hours as needed for mild pain or moderate pain.   Marland Kitchen aspirin EC 81 MG tablet Take 81 mg by mouth daily.  Marland Kitchen atorvastatin (LIPITOR) 40 MG tablet Take 40 mg by mouth daily.   . Cholecalciferol (D3-50) 1.25 MG (50000 UT) capsule Take 50,000 Units by mouth once a week.   . clopidogrel (PLAVIX) 75 MG tablet Take 75 mg by mouth daily.   . furosemide (LASIX) 20 MG tablet Take 20 mg by mouth.  . insulin glargine (LANTUS) 100 UNIT/ML injection Inject 15 Units into the skin at bedtime. If blood sugar is > 150  . insulin lispro (HUMALOG KWIKPEN) 100 UNIT/ML KiwkPen Inject 4  Units into the skin 3 (three) times daily with meals.   . isosorbide mononitrate (IMDUR) 30 MG 24 hr tablet Take 0.5 tablets (15 mg total) by mouth daily.  . memantine (NAMENDA) 10 MG tablet Take 10 mg by mouth 2 (two) times daily.   . metFORMIN (GLUCOPHAGE) 1000 MG tablet Take 1,000 mg by mouth 2 (two) times daily with a meal.   . metoprolol succinate (TOPROL XL) 25 MG 24 hr tablet Take 0.5 tablets (12.5 mg total) by mouth daily.  . mirtazapine (REMERON) 7.5 MG tablet Take 7.5 mg by mouth at bedtime.   . mometasone-formoterol (DULERA) 100-5 MCG/ACT AERO Inhale 2 puffs into the lungs 2 (two) times daily.   . nitroGLYCERIN (NITROSTAT) 0.4 MG SL tablet Place 0.4 mg under the tongue every 5 (five) minutes as needed for chest pain.  Marland Kitchen NUTRITIONAL SUPPLEMENTS PO Take 1 each by mouth 2 (two) times a day. Magic Cup with lunch and dinner meal  . ondansetron (ZOFRAN) 4 MG tablet Take 4 mg by mouth every 6 (six) hours as needed for nausea or vomiting.  . pantoprazole (PROTONIX) 40 MG tablet Take 40 mg by  mouth daily.  . polyethylene glycol (MIRALAX / GLYCOLAX) packet Take 17 g by mouth daily as needed for mild constipation.  . sennosides-docusate sodium (SENOKOT-S) 8.6-50 MG tablet Take 2 tablets by mouth daily.  . traMADol (ULTRAM) 50 MG tablet Take 1 tablet (50 mg total) by mouth 2 (two) times daily. Back pain  . traZODone (DESYREL) 50 MG tablet Take 25 mg by mouth at bedtime.   . [DISCONTINUED] Nutritional Supplement LIQD Take 120 mLs by mouth 2 (two) times a day. MedPass   No facility-administered encounter medications on file as of 08/25/2019.     Review of Systems   Is largely unobtainable secondary to dementia please see HPI nursing does not report any issues including any increased complaints of chest pain or shortness of breath  Immunization History  Administered Date(s) Administered  . Influenza Split 08/20/2014  . Influenza, High Dose Seasonal PF 10/06/2013  . Influenza-Unspecified 10/17/2017, 08/29/2018  . PPD Test 11/16/2016, 12/05/2016  . Pneumococcal Conjugate-13 10/17/2017  . Pneumococcal Polysaccharide-23 09/24/2009  . Tdap 10/18/2017   Pertinent  Health Maintenance Due  Topic Date Due  . INFLUENZA VACCINE  06/21/2019  . FOOT EXAM  11/21/2019 (Originally 11/03/1951)  . OPHTHALMOLOGY EXAM  09/08/2020 (Originally 11/03/1951)  . HEMOGLOBIN A1C  01/01/2020  . URINE MICROALBUMIN  04/30/2020  . DEXA SCAN  Completed  . PNA vac Low Risk Adult  Completed   Fall Risk  04/19/2018  Falls in the past year? No   Functional Status Survey:    Vitals:   08/25/19 0857  BP: 118/70  Pulse: 82  Resp: 17  Temp: (!) 96.7 F (35.9 C)  TempSrc: Oral  Weight: 126 lb 6.4 oz (57.3 kg)  Height: 5\' 4"  (1.626 m)   Body mass index is 21.7 kg/m. Physical Exam  In general this is a fairly well-nourished elderly female no distress lying comfortably in bed she is about to eat her lunch.  Her skin is warm and dry.  Eyes visual acuity appears grossly intact sclera and conjunctive  are clear.  Oropharynx is clear mucous membranes moist.  Chest is clear to auscultation with somewhat poor respiratory effort I could not really appreciate congestion or labored breathing.  Heart is regular rate and rhythm without murmur gallop or rub she does not really have significant lower extremity edema.  Abdomen  is soft nontender with positive bowel sounds.  Musculoskeletal Limited exam since she is in bed that he is able and appears to move her extremities at baseline she is often up in a wheelchair.  Neurologic appears grossly intact her speech is clear she does continue with confusion could not really appreciate lateralizing findings.  Psych she is oriented to self at times will follow simple verbal commands she is pleasant and not agitated by exam.     Labs reviewed: Recent Labs    07/16/19 07/21/19 07/28/19  NA 146 141 144  K 3.7 4.4 4.2  BUN 22* 24* 27*  CREATININE 0.9 0.8 0.8   Recent Labs    10/14/18 04/03/19  AST 12* 13  ALT 12 5*  ALKPHOS 81 65   Recent Labs    10/14/18 07/01/19  WBC 9.2 9.0  NEUTROABS  --  5  HGB 13.2 13.2  HCT 39 40  PLT 172 156   Lab Results  Component Value Date   TSH 4.91 10/21/2018   Lab Results  Component Value Date   HGBA1C 6.5 07/01/2019   Lab Results  Component Value Date   CHOL 127 10/14/2018   HDL 37 10/14/2018   LDLCALC 69 10/14/2018   TRIG 106 10/14/2018   CHOLHDL 2 12/19/2012    Significant Diagnostic Results in last 30 days:  No results found.  Assessment/Plan  #1 history of dementia at this point she appears to do relatively well with supportive care she is on Namenda 10 mg twice daily at this point continue to monitor her weight at this point appears to be relatively stabilized.  2.  History of diastolic CHF she is on Lasix 20 mg a day as well as Toprol-XL 12.5 mg a day.  3.  History of type 2 diabetes hemoglobin A1c at 6.2 is satisfactory she is on Lantus 15 units a day as well as Glucophage 2000  mg twice daily and Humalog 4 units with meals- blood sugars have some variability at times will have readings in the 200s but baseline appears to be more in the mid 100s.  4.  History of COPD this appears stable on Dulera twice a day.  5.  History of coronary artery disease she continues on aspirin as well as Toprol-XL Imdur Plavix she is also on atorvastatin her LDL was 69 back in November.  At this point appears stable no complaints of chest pain to my knowledge.  6.  History of GERD continues on Protonix 40 mg a day.  7.  History of hypertension she continues on Toprol-XL 12.5 mg a day she is also on the Lasix 20 mg a day and Imdur 50 mg a day at this point appears relatively stable with some variability at times.  8-History of   mild  Hypernatremia--will check BMP.Marland Kitchen  [id number]

## 2019-09-29 ENCOUNTER — Non-Acute Institutional Stay (SKILLED_NURSING_FACILITY): Payer: Medicare Other | Admitting: Internal Medicine

## 2019-09-29 ENCOUNTER — Encounter: Payer: Self-pay | Admitting: Internal Medicine

## 2019-09-29 DIAGNOSIS — I152 Hypertension secondary to endocrine disorders: Secondary | ICD-10-CM

## 2019-09-29 DIAGNOSIS — E1159 Type 2 diabetes mellitus with other circulatory complications: Secondary | ICD-10-CM

## 2019-09-29 DIAGNOSIS — Z794 Long term (current) use of insulin: Secondary | ICD-10-CM | POA: Diagnosis not present

## 2019-09-29 DIAGNOSIS — I10 Essential (primary) hypertension: Secondary | ICD-10-CM

## 2019-09-29 DIAGNOSIS — I25118 Atherosclerotic heart disease of native coronary artery with other forms of angina pectoris: Secondary | ICD-10-CM | POA: Diagnosis not present

## 2019-09-29 NOTE — Progress Notes (Signed)
Location:  [facility] Room Number: [id number] Place of Service:  SNF (31)  Hennie Duos, MD  Patient Care Team: Hennie Duos, MD as PCP - General (Internal Medicine)  Extended Emergency Contact Information Primary Emergency Contact: [name],[name] Address: [address]          [place], [place] [address] [name] of [place] Phone: [phone] Work Phone: [phone] Mobile Phone: [phone] Relation: Daughter Secondary Emergency Contact: [name],[name]  [place] Phone: [phone] Relation: Niece    Allergies: Lisinopril, Spironolactone, Vancomycin, Donepezil, Eggs or egg-derived products, Erythromycin, Macrolides and ketolides, Penicillins, Quinapril hcl, Angiotensin receptor blockers, Lipitor [atorvastatin], Pentazocine lactate, Propoxyphene hcl, and Sulfonamide derivatives  Chief Complaint  Patient presents with  . Medical Management of Chronic Issues    Routine visit of medical managment    HPI: Patient is 78 y.o. female who is being seen for routine issues of coronary artery disease, hypertension, and diabetes mellitus type 2.  Past Medical History:  Diagnosis Date  . Allergy to ACE inhibitors 03/17/2015  . Anxiety   . CAD (coronary artery disease)    a. s/p inferior STEMI 12/29/10 with rotablator atherectomy RCA 12/31/10 and DES x 2 RCA;  b. Lexiscan Myoview (02/2015): mild reversible apical anterior perfusion defect. C. cath 02/2015 50% LAD lesion with patent stent, Tx Rx    . Cardiomyopathy, ischemic 03/17/2015  . Chronic diastolic CHF (congestive heart failure) ([place]) 11/12/2016  . COPD (chronic obstructive pulmonary disease) ([place])   . Coronary atherosclerosis of native coronary artery 04/03/2013  . Dementia ([place])   . Depression 03/14/2017  . Diverticulosis   . DM2 (diabetes mellitus, type 2) ([place]) 04/02/2013  . Dysphagia 05/10/2017   Minimal 6/20 - pureed diet  . Fibromyalgia 04/20/2008   Qualifier:  Diagnosis of  By: Nelson-Smith CMA (AAMA), Dottie    . GERD (gastroesophageal reflux disease)   . Glaucoma   . HTN (hypertension)   . Hyperlipidemia   . Ischemic cardiomyopathy    EF 45%cath 2012 EF55% 5/12; 35% 11/14  . Memory deficit   . Nephrolithiasis   . Obesity (BMI 30-39.9) 10/06/2015  . Osteoarthritis   . Overweight(278.02)   . Respiratory arrest//ACE Inhibitor presumed cause   . Senile dementia without behavioral disturbance ([place]) 10/06/2015  . Sleep apnea   . Small bowel obstruction (HCC)    from a sigmoid stricture  . Ventricular tachycardia ([place]) 03/17/2015  . Vitamin D deficiency 01/28/2017    Past Surgical History:  Procedure Laterality Date  . bilateral tubal ligation    . CARDIAC CATHETERIZATION N/A 03/18/2015   Procedure: LEFT HEART CATH AND CORONARY ANGIOGRAPHY;  Surgeon: Lorretta Harp, MD;  Location: [facility] CATH LAB;  Service: Cardiovascular;  Laterality: N/A;  . CARDIAC SURGERY    . HIP ARTHROPLASTY Right 11/10/2016   Procedure: ARTHROPLASTY BIPOLAR HIP (HEMIARTHROPLASTY);  Surgeon: Marchia Bond, MD;  Location: [facility];  Service: Orthopedics;  Laterality: Right;  . 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 LITHOTRIPSY      Allergies as of 09/29/2019      Reactions   Lisinopril Other (See Comments)   Reaction:  Respiratory arrest    Spironolactone Anaphylaxis   Vancomycin Anaphylaxis, Rash   Donepezil Diarrhea   Eggs Or Egg-derived Products Diarrhea   Erythromycin Other (See Comments)   Reaction:  Makes pt feel weird    Macrolides And Ketolides Other (See Comments)   Reaction:  Unknown  Penicillins Itching, Other (See Comments)   Has patient had a PCN reaction causing immediate rash, facial/tongue/throat swelling, SOB or lightheadedness with hypotension: No Has patient had a PCN reaction causing severe rash involving mucus membranes or skin necrosis: No Has patient had a PCN reaction that required hospitalization No Has  patient had a PCN reaction occurring within the last 10 years: No If all of the above answers are "NO", then may proceed with Cephalosporin use.   Quinapril Hcl Itching   Angiotensin Receptor Blockers Other (See Comments)   Reaction:  Unknown    Lipitor [atorvastatin] Palpitations   Pentazocine Lactate Other (See Comments)   Reaction:  Unknown    Propoxyphene Hcl Other (See Comments)   Reaction unknown   Sulfonamide Derivatives Other (See Comments)   Reaction:  Unknown       Medication List       Accurate as of September 29, 2019 11:59 PM. If you have any questions, ask your nurse or doctor.        STOP taking these medications   acetaminophen 325 MG tablet Commonly known as: TYLENOL Stopped by: Inocencio Homes, MD   nitroGLYCERIN 0.4 MG SL tablet Commonly known as: NITROSTAT Stopped by: Inocencio Homes, MD     TAKE these medications   aspirin EC 81 MG tablet Take 81 mg by mouth daily.   atorvastatin 40 MG tablet Commonly known as: LIPITOR Take 40 mg by mouth daily.   clopidogrel 75 MG tablet Commonly known as: PLAVIX Take 75 mg by mouth daily.   D3-50 1.25 MG (50000 UT) capsule Generic drug: Cholecalciferol Take 50,000 Units by mouth once a week.   furosemide 20 MG tablet Commonly known as: LASIX Take 20 mg by mouth.   HumaLOG KwikPen 100 UNIT/ML KiwkPen Generic drug: insulin lispro Inject 4 Units into the skin 3 (three) times daily with meals.   insulin glargine 100 UNIT/ML injection Commonly known as: LANTUS Inject 15 Units into the skin at bedtime. If blood sugar is > 150   isosorbide mononitrate 30 MG 24 hr tablet Commonly known as: IMDUR Take 0.5 tablets (15 mg total) by mouth daily.   LORazepam 1 MG tablet Commonly known as: ATIVAN :Give 1 tablet by mouth prior to Dental Procdure as directed by the [facility] Staff on 11/ 08/2019   memantine 10 MG tablet Commonly known as: NAMENDA Take 10 mg by mouth 2 (two) times daily.   metFORMIN  1000 MG tablet Commonly known as: GLUCOPHAGE Take 1,000 mg by mouth 2 (two) times daily with a meal.   metoprolol succinate 25 MG 24 hr tablet Commonly known as: Toprol XL Take 0.5 tablets (12.5 mg total) by mouth daily.   mirtazapine 7.5 MG tablet Commonly known as: REMERON Take 7.5 mg by mouth at bedtime.   mometasone-formoterol 100-5 MCG/ACT Aero Commonly known as: DULERA Inhale 2 puffs into the lungs 2 (two) times daily.   NUTRITIONAL SUPPLEMENTS PO Take 1 each by mouth 2 (two) times a day. Magic Cup with lunch and dinner meal   ondansetron 4 MG tablet Commonly known as: ZOFRAN Take 4 mg by mouth every 6 (six) hours as needed for nausea or vomiting.   pantoprazole 40 MG tablet Commonly known as: PROTONIX Take 40 mg by mouth daily.   polyethylene glycol 17 g packet Commonly known as: MIRALAX / GLYCOLAX Take 17 g by mouth daily as needed for mild constipation.   sennosides-docusate sodium 8.6-50 MG tablet Commonly known as: SENOKOT-S Take 2  tablets by mouth daily.   traMADol 50 MG tablet Commonly known as: ULTRAM Take 1 tablet (50 mg total) by mouth 2 (two) times daily. Back pain   traZODone 50 MG tablet Commonly known as: DESYREL Take 25 mg by mouth at bedtime.       No orders of the defined types were placed in this encounter.   Immunization History  Administered Date(s) Administered  . Influenza Split 08/20/2014  . Influenza, High Dose Seasonal PF 10/06/2013  . Influenza-Unspecified 10/17/2017, 08/29/2018, 08/27/2019  . PPD Test 11/16/2016, 12/05/2016  . Pneumococcal Conjugate-13 10/17/2017  . Pneumococcal Polysaccharide-23 09/24/2009  . Tdap 10/18/2017    Social History   Tobacco Use  . Smoking status: Former Smoker    Packs/day: 1.00    Years: 25.00    Pack years: 25.00    Types: Cigarettes  . Smokeless tobacco: Never Used  . Tobacco comment: over a ppd for 40+ years; quit 10/2004.  unsuccessfully tried eCigs.  Substance Use Topics  .  Alcohol use: No    Alcohol/week: 1.0 standard drinks    Types: 1 Standard drinks or equivalent per week    Review of Systems  DATA OBTAINED: from nurse GENERAL:  no fevers, fatigue, appetite changes SKIN: No itching, rash HEENT: No complaint RESPIRATORY: No cough, wheezing, SOB CARDIAC: No chest pain, palpitations, lower extremity edema  GI: No abdominal pain, No N/V/D or constipation, No heartburn or reflux  GU: No dysuria, frequency or urgency, or incontinence  MUSCULOSKELETAL: No unrelieved bone/joint pain NEUROLOGIC: No headache, dizziness  PSYCHIATRIC: No overt anxiety or sadness  Vitals:   09/29/19 1116  BP: 124/70  Pulse: 78  Resp: 19  Temp: 98.9 F (37.2 C)  SpO2: 98%   Body mass index is 21.58 kg/m. Physical Exam  GENERAL APPEARANCE: Alert, nonconversant conversant, No acute distress  SKIN: No diaphoresis rash HEENT: Unremarkable RESPIRATORY: Breathing is even, unlabored. Lung sounds are clear   CARDIOVASCULAR: Heart RRR no murmurs, rubs or gallops. No peripheral edema  GASTROINTESTINAL: Abdomen is soft, non-tender, not distended w/ normal bowel sounds.  GENITOURINARY: Bladder non tender, not distended  MUSCULOSKELETAL: No abnormal joints or musculature NEUROLOGIC: Cranial nerves 2-12 grossly intact. Moves all extremities PSYCHIATRIC: Mood and affect with dementia, no behavioral issues  Patient Active Problem List   Diagnosis Date Noted  . Hypertension associated with type 2 diabetes mellitus ([place]) 03/06/2019  . Dyslipidemia associated with type 2 diabetes mellitus ([place]) 03/06/2019  . Acquired hypothyroidism 03/06/2019  . Vascular dementia without behavioral disturbance ([place]) 03/06/2019  . Insomnia 08/06/2018  . GERD (gastroesophageal reflux disease) 08/04/2017  . Dysphagia 05/10/2017  . Depression 03/14/2017  . Vitamin D deficiency 01/28/2017  . UTI due to extended-spectrum beta lactamase (ESBL) producing Escherichia coli 11/18/2016  . Urinary tract  infection due to Proteus 11/18/2016  . Postoperative anemia due to acute blood loss 11/18/2016  . HTN (hypertension) 11/18/2016  . Chronic diastolic CHF (congestive heart failure) ([place]) 11/12/2016  . Closed right hip fracture ([place]) 11/10/2016  . Senile dementia without behavioral disturbance ([place]) 10/06/2015  . Obesity (BMI 30-39.9) 10/06/2015  . Tobacco use disorder 03/17/2015  . Cardiomyopathy, ischemic 03/17/2015  . Allergy to ACE inhibitors 03/17/2015  . Ventricular tachycardia ([place]) 03/17/2015  . HLD (hyperlipidemia) 12/31/2014  . COPD (chronic obstructive pulmonary disease) ([place]) 12/31/2014  . Chronic cough 09/12/2013  . Memory deficit 04/23/2013  . Essential hypertension, benign 04/03/2013  . Dyslipidemia 04/03/2013  . Coronary atherosclerosis of native coronary artery 04/03/2013  . DM2 (  diabetes mellitus, type 2) ([place]) 04/02/2013  . Fibromyalgia 04/20/2008    CMP     Component Value Date/Time   NA 144 07/28/2019   K 4.2 07/28/2019   CL 103 11/16/2016 0522   CO2 31 11/16/2016 0522   GLUCOSE 220 (H) 11/16/2016 0522   BUN 27 (A) 07/28/2019   CREATININE 0.8 07/28/2019   CREATININE 0.77 11/16/2016 0522   CALCIUM 8.2 (L) 11/16/2016 0522   PROT 7.7 04/27/2016 1724   ALBUMIN 4.4 04/27/2016 1724   AST 13 04/03/2019   ALT 5 (A) 04/03/2019   ALKPHOS 65 04/03/2019   BILITOT 1.2 04/27/2016 1724   GFRNONAA >60 11/16/2016 0522   GFRAA >60 11/16/2016 0522   Recent Labs    07/16/19 07/21/19 07/28/19  NA 146 141 144  K 3.7 4.4 4.2  BUN 22* 24* 27*  CREATININE 0.9 0.8 0.8   Recent Labs    10/14/18 04/03/19  AST 12* 13  ALT 12 5*  ALKPHOS 81 65   Recent Labs    10/14/18 07/01/19  WBC 9.2 9.0  NEUTROABS  --  5  HGB 13.2 13.2  HCT 39 40  PLT 172 156   Recent Labs    10/14/18  CHOL 127  LDLCALC 69  TRIG 106   Lab Results  Component Value Date   MICROALBUR 1.2 05/01/2019   Lab Results  Component Value Date   TSH 4.91 10/21/2018   Lab Results  Component  Value Date   HGBA1C 6.5 07/01/2019   Lab Results  Component Value Date   CHOL 127 10/14/2018   HDL 37 10/14/2018   LDLCALC 69 10/14/2018   TRIG 106 10/14/2018   CHOLHDL 2 12/19/2012    Significant Diagnostic Results in last 30 days:  No results found.  Assessment and Plan  Coronary atherosclerosis of native coronary artery No reported chest pain; continue Imdur 50 mg daily, aspirin 81 mg daily, Plavix 75 mg daily, and Toprol-XL 12.5 mg daily with as needed sublingual nitroglycerin; patient is on statin  Hypertension associated with type 2 diabetes mellitus (HCC) Chronic and stable; continue Lasix 20 mg daily, Imdur 15 mg daily and Toprol-XL 12.5 mg daily  DM2 (diabetes mellitus, type 2) (HCC) Controlled; continue Glucophage 2000 mg twice daily and regular insulin 4 units with every meal; in addition Lantus 15 units nightly for blood sugar greater than 150; patient is allergic to ACE, patient is on statin     Hennie Duos, MD

## 2019-10-04 ENCOUNTER — Encounter: Payer: Self-pay | Admitting: Internal Medicine

## 2019-10-04 NOTE — Assessment & Plan Note (Signed)
Chronic and stable; continue Lasix 20 mg daily, Imdur 15 mg daily and Toprol-XL 12.5 mg daily

## 2019-10-04 NOTE — Assessment & Plan Note (Signed)
Controlled; continue Glucophage 2000 mg twice daily and regular insulin 4 units with every meal; in addition Lantus 15 units nightly for blood sugar greater than 150; patient is allergic to ACE, patient is on statin

## 2019-10-04 NOTE — Assessment & Plan Note (Signed)
No reported chest pain; continue Imdur 50 mg daily, aspirin 81 mg daily, Plavix 75 mg daily, and Toprol-XL 12.5 mg daily with as needed sublingual nitroglycerin; patient is on statin

## 2019-10-06 ENCOUNTER — Non-Acute Institutional Stay (SKILLED_NURSING_FACILITY): Payer: Medicare Other | Admitting: Internal Medicine

## 2019-10-06 DIAGNOSIS — E1159 Type 2 diabetes mellitus with other circulatory complications: Secondary | ICD-10-CM | POA: Diagnosis not present

## 2019-10-06 DIAGNOSIS — I5032 Chronic diastolic (congestive) heart failure: Secondary | ICD-10-CM

## 2019-10-06 DIAGNOSIS — E559 Vitamin D deficiency, unspecified: Secondary | ICD-10-CM

## 2019-10-06 DIAGNOSIS — E870 Hyperosmolality and hypernatremia: Secondary | ICD-10-CM

## 2019-10-06 DIAGNOSIS — Z794 Long term (current) use of insulin: Secondary | ICD-10-CM

## 2019-10-06 NOTE — Progress Notes (Signed)
This is an acute visit.  Level care skilled.  [facility]  Chief complaint-acute visit follow-up vitamin D deficiency-also history of hypernatremia.  History of present illness.  Patient is a 78 year old female who is a long-term resident of this facility. She has a history of dementia as well as coronary artery disease hypertension type 2 diabetes as well as diastolic CHF COPD GERD and intermittent hypernatremia.  She also has a history of vitamin D deficiency.  She is on 50,000 units q. weekly but vitamin D level continues to hover around 14 it was 14.14 on lab done this month it was 14.13 back in January.  In regards to her CHF this appears stable weight is stable at around 126 pounds she has scant edema.  She is a poor historian secondary to dementia but nursing staff does not note any complaints of shortness of breath or chest pain.  At times she will have some mild hypernatremia in the high 140s-a month ago her sodium was normal at 143 we will update this.  She does continue on Lasix 20 mg a day.  Past Medical History:  Diagnosis Date  . Allergy to ACE inhibitors 03/17/2015  . Anxiety   . CAD (coronary artery disease)    a. s/p inferior STEMI 12/29/10 with rotablator atherectomy RCA 12/31/10 and DES x 2 RCA;  b. Lexiscan Myoview (02/2015): mild reversible apical anterior perfusion defect. C. cath 02/2015 50% LAD lesion with patent stent, Tx Rx    . Cardiomyopathy, ischemic 03/17/2015  . Chronic diastolic CHF (congestive heart failure) ([place]) 11/12/2016  . COPD (chronic obstructive pulmonary disease) ([place])   . Coronary atherosclerosis of native coronary artery 04/03/2013  . Dementia ([place])   . Depression 03/14/2017  . Diverticulosis   . DM2 (diabetes mellitus, type 2) ([place]) 04/02/2013  . Dysphagia 05/10/2017   Minimal 6/20 - pureed diet  . Fibromyalgia 04/20/2008   Qualifier: Diagnosis of  By: Nelson-Smith CMA (AAMA), Dottie    . GERD (gastroesophageal reflux  disease)   . Glaucoma   . HTN (hypertension)   . Hyperlipidemia   . Ischemic cardiomyopathy    EF 45%cath 2012 EF55% 5/12; 35% 11/14  . Memory deficit   . Nephrolithiasis   . Obesity (BMI 30-39.9) 10/06/2015  . Osteoarthritis   . Overweight(278.02)   . Respiratory arrest//ACE Inhibitor presumed cause   . Senile dementia without behavioral disturbance ([place]) 10/06/2015  . Sleep apnea   . Small bowel obstruction (HCC)    from a sigmoid stricture  . Ventricular tachycardia ([place]) 03/17/2015  . Vitamin D deficiency 01/28/2017        Past Surgical History:  Procedure Laterality Date  . bilateral tubal ligation    . CARDIAC CATHETERIZATION N/A 03/18/2015   Procedure: LEFT HEART CATH AND CORONARY ANGIOGRAPHY;  Surgeon: Lorretta Harp, MD;  Location[facility] CATH LAB;  Service: Cardiovascular;  Laterality: N/A;  . CARDIAC SURGERY    . HIP ARTHROPLASTY Right 11/10/2016   Procedure: ARTHROPLASTY BIPOLAR HIP (HEMIARTHROPLASTY);  Surgeon: Marchia Bond, MD;  Location: [facility];  Service: Orthopedics;  Laterality: Right;  . 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 LITHOTRIPSY           Allergies  Allergen Reactions  . Lisinopril Other (See Comments)    Reaction:  Respiratory arrest   . Spironolactone Anaphylaxis  . Vancomycin Anaphylaxis and Rash  . Donepezil Diarrhea  . Eggs Or Egg-Derived Products Diarrhea  . Erythromycin Other (  See Comments)    Reaction:  Makes pt feel weird   . Macrolides And Ketolides Other (See Comments)    Reaction:  Unknown   . Penicillins Itching and Other (See Comments)    Has patient had a PCN reaction causing immediate rash, facial/tongue/throat swelling, SOB or lightheadedness with hypotension: No Has patient had a PCN reaction causing severe rash involving mucus membranes or skin necrosis: No Has patient had a PCN reaction that required hospitalization No Has patient had a PCN reaction occurring  within the last 10 years: No If all of the above answers are "NO", then may proceed with Cephalosporin use.  . Quinapril Hcl Itching  . Angiotensin Receptor Blockers Other (See Comments)    Reaction:  Unknown   . Lipitor [Atorvastatin] Palpitations  . Pentazocine Lactate Other (See Comments)    Reaction:  Unknown   . Propoxyphene Hcl Other (See Comments)    Reaction unknown  . Sulfonamide Derivatives Other (See Comments)    Reaction:  Unknown         MEDICATIONS   Medication Sig  . acetaminophen (TYLENOL) 325 MG tablet Take 650 mg by mouth every 6 (six) hours as needed for mild pain or moderate pain.   Marland Kitchen aspirin EC 81 MG tablet Take 81 mg by mouth daily.  Marland Kitchen atorvastatin (LIPITOR) 40 MG tablet Take 40 mg by mouth daily.   . Cholecalciferol (D3-50) 1.25 MG (50000 UT) capsule Take 50,000 Units by mouth once a week.   . clopidogrel (PLAVIX) 75 MG tablet Take 75 mg by mouth daily.   . furosemide (LASIX) 20 MG tablet Take 20 mg by mouth.  . insulin glargine (LANTUS) 100 UNIT/ML injection Inject 15 Units into the skin at bedtime. If blood sugar is > 150  . insulin lispro (HUMALOG KWIKPEN) 100 UNIT/ML KiwkPen Inject 4 Units into the skin 3 (three) times daily with meals.   . isosorbide mononitrate (IMDUR) 30 MG 24 hr tablet Take 0.5 tablets (15 mg total) by mouth daily.  . memantine (NAMENDA) 10 MG tablet Take 10 mg by mouth 2 (two) times daily.   . metFORMIN (GLUCOPHAGE) 1000 MG tablet Take 1,000 mg by mouth 2 (two) times daily with a meal.   . metoprolol succinate (TOPROL XL) 25 MG 24 hr tablet Take 0.5 tablets (12.5 mg total) by mouth daily.  . mirtazapine (REMERON) 7.5 MG tablet Take 7.5 mg by mouth at bedtime.   . mometasone-formoterol (DULERA) 100-5 MCG/ACT AERO Inhale 2 puffs into the lungs 2 (two) times daily.   . nitroGLYCERIN (NITROSTAT) 0.4 MG SL tablet Place 0.4 mg under the tongue every 5 (five) minutes as needed for chest pain.  Marland Kitchen NUTRITIONAL SUPPLEMENTS PO Take 1 each  by mouth 2 (two) times a day. Magic Cup with lunch and dinner meal  . ondansetron (ZOFRAN) 4 MG tablet Take 4 mg by mouth every 6 (six) hours as needed for nausea or vomiting.  . pantoprazole (PROTONIX) 40 MG tablet Take 40 mg by mouth daily.  . polyethylene glycol (MIRALAX / GLYCOLAX) packet Take 17 g by mouth daily as needed for mild constipation.  . sennosides-docusate sodium (SENOKOT-S) 8.6-50 MG tablet Take 2 tablets by mouth daily.  . traMADol (ULTRAM) 50 MG tablet Take 1 tablet (50 mg total) by mouth 2 (two) times daily. Back pain  . traZODone (DESYREL) 50 MG tablet Take 25 mg by mouth at bedtime.   . [DISCONTINUED] Nutritional Supplement LIQD Take 120 mLs by mouth 2 (two) times a  day. MedPass       Immunization History  Administered Date(s) Administered  . Influenza Split 08/20/2014  . Influenza, High Dose Seasonal PF 10/06/2013  . Influenza-Unspecified 10/17/2017, 08/29/2018, 08/27/2019  . PPD Test 11/16/2016, 12/05/2016  . Pneumococcal Conjugate-13 10/17/2017  . Pneumococcal Polysaccharide-23 09/24/2009  . Tdap 10/18/2017    Social History        Tobacco Use  . Smoking status: Former Smoker    Packs/day: 1.00    Years: 25.00    Pack years: 25.00    Types: Cigarettes  . Smokeless tobacco: Never Used  . Tobacco comment: over a ppd for 40+ years; quit 10/2004.  unsuccessfully tried eCigs.  Substance Use Topics  . Alcohol use: No    Alcohol/week: 1.0 standard drinks    Types: 1 Standard drinks or equivalent per week    Review of systems.  This is largely unobtainable secondary to dementia please see HPI nursing does not report any recent acute issues any increased shortness of breath or chest pain.  Physical exam.  Temperature is 98.0 pulse 76 respirations 18 blood pressure 131/78.  In general this is a fairly well-nourished elderly female in no distress.  Her skin is warm and dry.  Eyes visual acuity appears grossly intact.  Oropharynx  appears to be clear mucous membranes moist.  Chest is clear to auscultation with somewhat shallow air entry and poor respiratory effort she does not really follow verbal commands could not appreciate any overt congestion or labored breathing.  Heart is regular rate and rhythm without murmur gallop or rub-she has minimal lower extremity edema.  Abdomen is soft nontender with positive bowel sounds  Musculoskeletal appears able to move all extremities x4 at baseline currently she is sitting up in wheelchair.  Neurologic appears to be grossly intact her speech is clear she continues with confusion-.  Psych she is oriented to self but not really following verbal commands very well today but this is not new she did not appear to be agitated with exam.  Labs.  September 29, 2019.  Vitamin D level 14.14.    September 02, 2019.  Sodium 143 potassium 5.1 BUN 29.9 creatinine 1.09.  Recent Labs    07/16/19 07/21/19 07/28/19  NA 146 141 144  K 3.7 4.4 4.2  BUN 22* 24* 27*  CREATININE 0.9 0.8 0.8     Recent Labs (within last 365 days)      Recent Labs    10/14/18 04/03/19  AST 12* 13  ALT 12 5*  ALKPHOS 81 65     Recent Labs (within last 365 days)      Recent Labs    10/14/18 07/01/19  WBC 9.2 9.0  NEUTROABS  --  5  HGB 13.2 13.2  HCT 39 40  PLT 172 156     Recent Labs (within last 365 days)     Recent Labs    10/14/18  CHOL 127  LDLCALC 69  TRIG 106     Recent Labs    #1-vitamin D deficiency-vitamin D level continues to be low despite being on 50,000 units q. weekly-there is some thought that possibly she may occasionally miss a dose since its only once a week-we will change her to daily dosing 6000 units and recheck in 4 weeks to see if this will be more beneficial.  2.-History of hypernatremia-at times she will need to hold her Lasix secondary to elevated sodium will recheck this to ensure stability.  3.  History of CHF  at this point appears to be compensated  her weight is stable clinically to appears to be at baseline-she is on Lasix 20 mg a day.  She is also on Toprol 12.5 mg a day.  4.-History of type 2 diabetes this appears stable on Lantus 15 units a day as well as Humalog 4 units 3 times daily blood sugars appear to be mainly in the lower to mid 100s --occasionally there are readings in the 90s--at this point will monitor  [phone]

## 2019-10-07 ENCOUNTER — Encounter: Payer: Self-pay | Admitting: Internal Medicine

## 2019-10-13 ENCOUNTER — Inpatient Hospital Stay (HOSPITAL_COMMUNITY)
Admission: EM | Admit: 2019-10-13 | Discharge: 2019-10-21 | DRG: 871 | Disposition: A | Discharge status: Skilled Nursing Facility | Payer: Medicare Other | Source: Skilled Nursing Facility | Attending: Internal Medicine | Admitting: Internal Medicine

## 2019-10-13 ENCOUNTER — Inpatient Hospital Stay (HOSPITAL_COMMUNITY): Payer: Medicare Other

## 2019-10-13 ENCOUNTER — Encounter: Payer: Self-pay | Admitting: Internal Medicine

## 2019-10-13 ENCOUNTER — Other Ambulatory Visit: Payer: Self-pay

## 2019-10-13 ENCOUNTER — Emergency Department (HOSPITAL_COMMUNITY): Payer: Medicare Other

## 2019-10-13 ENCOUNTER — Encounter (HOSPITAL_COMMUNITY): Payer: Self-pay | Admitting: Emergency Medicine

## 2019-10-13 ENCOUNTER — Non-Acute Institutional Stay (SKILLED_NURSING_FACILITY): Payer: Medicare Other | Admitting: Internal Medicine

## 2019-10-13 DIAGNOSIS — Z20828 Contact with and (suspected) exposure to other viral communicable diseases: Secondary | ICD-10-CM | POA: Diagnosis present

## 2019-10-13 DIAGNOSIS — F0280 Dementia in other diseases classified elsewhere without behavioral disturbance: Secondary | ICD-10-CM | POA: Diagnosis present

## 2019-10-13 DIAGNOSIS — E875 Hyperkalemia: Secondary | ICD-10-CM | POA: Diagnosis present

## 2019-10-13 DIAGNOSIS — D72829 Elevated white blood cell count, unspecified: Secondary | ICD-10-CM

## 2019-10-13 DIAGNOSIS — Z794 Long term (current) use of insulin: Secondary | ICD-10-CM | POA: Diagnosis not present

## 2019-10-13 DIAGNOSIS — L89152 Pressure ulcer of sacral region, stage 2: Secondary | ICD-10-CM | POA: Diagnosis present

## 2019-10-13 DIAGNOSIS — Z96641 Presence of right artificial hip joint: Secondary | ICD-10-CM | POA: Diagnosis present

## 2019-10-13 DIAGNOSIS — E876 Hypokalemia: Secondary | ICD-10-CM | POA: Diagnosis present

## 2019-10-13 DIAGNOSIS — Z880 Allergy status to penicillin: Secondary | ICD-10-CM

## 2019-10-13 DIAGNOSIS — E1159 Type 2 diabetes mellitus with other circulatory complications: Secondary | ICD-10-CM

## 2019-10-13 DIAGNOSIS — F419 Anxiety disorder, unspecified: Secondary | ICD-10-CM | POA: Diagnosis present

## 2019-10-13 DIAGNOSIS — R6520 Severe sepsis without septic shock: Secondary | ICD-10-CM | POA: Diagnosis present

## 2019-10-13 DIAGNOSIS — F05 Delirium due to known physiological condition: Secondary | ICD-10-CM | POA: Diagnosis present

## 2019-10-13 DIAGNOSIS — G309 Alzheimer's disease, unspecified: Secondary | ICD-10-CM | POA: Diagnosis present

## 2019-10-13 DIAGNOSIS — A021 Salmonella sepsis: Principal | ICD-10-CM | POA: Diagnosis present

## 2019-10-13 DIAGNOSIS — Z7902 Long term (current) use of antithrombotics/antiplatelets: Secondary | ICD-10-CM

## 2019-10-13 DIAGNOSIS — A084 Viral intestinal infection, unspecified: Secondary | ICD-10-CM

## 2019-10-13 DIAGNOSIS — A020 Salmonella enteritis: Secondary | ICD-10-CM | POA: Diagnosis not present

## 2019-10-13 DIAGNOSIS — J449 Chronic obstructive pulmonary disease, unspecified: Secondary | ICD-10-CM | POA: Diagnosis present

## 2019-10-13 DIAGNOSIS — Z91012 Allergy to eggs: Secondary | ICD-10-CM

## 2019-10-13 DIAGNOSIS — R7881 Bacteremia: Secondary | ICD-10-CM | POA: Diagnosis not present

## 2019-10-13 DIAGNOSIS — G9341 Metabolic encephalopathy: Secondary | ICD-10-CM

## 2019-10-13 DIAGNOSIS — I5032 Chronic diastolic (congestive) heart failure: Secondary | ICD-10-CM | POA: Diagnosis present

## 2019-10-13 DIAGNOSIS — I2510 Atherosclerotic heart disease of native coronary artery without angina pectoris: Secondary | ICD-10-CM | POA: Diagnosis present

## 2019-10-13 DIAGNOSIS — I255 Ischemic cardiomyopathy: Secondary | ICD-10-CM | POA: Diagnosis present

## 2019-10-13 DIAGNOSIS — N179 Acute kidney failure, unspecified: Secondary | ICD-10-CM

## 2019-10-13 DIAGNOSIS — H409 Unspecified glaucoma: Secondary | ICD-10-CM | POA: Diagnosis present

## 2019-10-13 DIAGNOSIS — I252 Old myocardial infarction: Secondary | ICD-10-CM

## 2019-10-13 DIAGNOSIS — Z9861 Coronary angioplasty status: Secondary | ICD-10-CM | POA: Diagnosis not present

## 2019-10-13 DIAGNOSIS — E1165 Type 2 diabetes mellitus with hyperglycemia: Secondary | ICD-10-CM

## 2019-10-13 DIAGNOSIS — Z8249 Family history of ischemic heart disease and other diseases of the circulatory system: Secondary | ICD-10-CM

## 2019-10-13 DIAGNOSIS — R918 Other nonspecific abnormal finding of lung field: Secondary | ICD-10-CM | POA: Diagnosis present

## 2019-10-13 DIAGNOSIS — A419 Sepsis, unspecified organism: Secondary | ICD-10-CM

## 2019-10-13 DIAGNOSIS — Z803 Family history of malignant neoplasm of breast: Secondary | ICD-10-CM

## 2019-10-13 DIAGNOSIS — E860 Dehydration: Secondary | ICD-10-CM | POA: Diagnosis present

## 2019-10-13 DIAGNOSIS — Z833 Family history of diabetes mellitus: Secondary | ICD-10-CM

## 2019-10-13 DIAGNOSIS — E872 Acidosis, unspecified: Secondary | ICD-10-CM

## 2019-10-13 DIAGNOSIS — N170 Acute kidney failure with tubular necrosis: Secondary | ICD-10-CM | POA: Diagnosis not present

## 2019-10-13 DIAGNOSIS — Z7982 Long term (current) use of aspirin: Secondary | ICD-10-CM

## 2019-10-13 DIAGNOSIS — E785 Hyperlipidemia, unspecified: Secondary | ICD-10-CM | POA: Diagnosis present

## 2019-10-13 DIAGNOSIS — Z66 Do not resuscitate: Secondary | ICD-10-CM | POA: Diagnosis present

## 2019-10-13 DIAGNOSIS — R112 Nausea with vomiting, unspecified: Secondary | ICD-10-CM

## 2019-10-13 DIAGNOSIS — Z79899 Other long term (current) drug therapy: Secondary | ICD-10-CM

## 2019-10-13 DIAGNOSIS — K219 Gastro-esophageal reflux disease without esophagitis: Secondary | ICD-10-CM | POA: Diagnosis present

## 2019-10-13 DIAGNOSIS — Z87891 Personal history of nicotine dependence: Secondary | ICD-10-CM

## 2019-10-13 DIAGNOSIS — Z87442 Personal history of urinary calculi: Secondary | ICD-10-CM

## 2019-10-13 DIAGNOSIS — Z79891 Long term (current) use of opiate analgesic: Secondary | ICD-10-CM

## 2019-10-13 DIAGNOSIS — Z823 Family history of stroke: Secondary | ICD-10-CM

## 2019-10-13 DIAGNOSIS — Z888 Allergy status to other drugs, medicaments and biological substances status: Secondary | ICD-10-CM

## 2019-10-13 DIAGNOSIS — F329 Major depressive disorder, single episode, unspecified: Secondary | ICD-10-CM | POA: Diagnosis present

## 2019-10-13 DIAGNOSIS — Z882 Allergy status to sulfonamides status: Secondary | ICD-10-CM

## 2019-10-13 HISTORY — DX: Dementia in other diseases classified elsewhere, unspecified severity, without behavioral disturbance, psychotic disturbance, mood disturbance, and anxiety: F02.80

## 2019-10-13 LAB — CBC WITH DIFFERENTIAL/PLATELET
Abs Immature Granulocytes: 0 10*3/uL (ref 0.00–0.07)
Basophils Absolute: 0 10*3/uL (ref 0.0–0.1)
Basophils Relative: 0 %
Eosinophils Absolute: 0 10*3/uL (ref 0.0–0.5)
Eosinophils Relative: 0 %
HCT: 48.1 % — ABNORMAL HIGH (ref 36.0–46.0)
Hemoglobin: 15.3 g/dL — ABNORMAL HIGH (ref 12.0–15.0)
Lymphocytes Relative: 7 %
Lymphs Abs: 1.8 10*3/uL (ref 0.7–4.0)
MCH: 32.3 pg (ref 26.0–34.0)
MCHC: 31.8 g/dL (ref 30.0–36.0)
MCV: 101.5 fL — ABNORMAL HIGH (ref 80.0–100.0)
Monocytes Absolute: 0.5 10*3/uL (ref 0.1–1.0)
Monocytes Relative: 2 %
Neutro Abs: 23.2 10*3/uL — ABNORMAL HIGH (ref 1.7–7.7)
Neutrophils Relative %: 91 %
Platelets: 230 10*3/uL (ref 150–400)
RBC: 4.74 MIL/uL (ref 3.87–5.11)
RDW: 13.3 % (ref 11.5–15.5)
WBC: 25.5 10*3/uL — ABNORMAL HIGH (ref 4.0–10.5)
nRBC: 0 % (ref 0.0–0.2)
nRBC: 0 /100 WBC

## 2019-10-13 LAB — COMPREHENSIVE METABOLIC PANEL
ALT: 16 U/L (ref 0–44)
AST: 29 U/L (ref 15–41)
Albumin: 3.5 g/dL (ref 3.5–5.0)
Alkaline Phosphatase: 105 U/L (ref 38–126)
Anion gap: 27 — ABNORMAL HIGH (ref 5–15)
BUN: 106 mg/dL — ABNORMAL HIGH (ref 8–23)
CO2: 10 mmol/L — ABNORMAL LOW (ref 22–32)
Calcium: 10.1 mg/dL (ref 8.9–10.3)
Chloride: 106 mmol/L (ref 98–111)
Creatinine, Ser: 7.23 mg/dL — ABNORMAL HIGH (ref 0.44–1.00)
GFR calc Af Amer: 6 mL/min — ABNORMAL LOW (ref >60)
GFR calc non Af Amer: 5 mL/min — ABNORMAL LOW (ref >60)
Glucose, Bld: 189 mg/dL — ABNORMAL HIGH (ref 70–99)
Potassium: 5.1 mmol/L (ref 3.5–5.1)
Sodium: 143 mmol/L (ref 135–145)
Total Bilirubin: 0.6 mg/dL (ref 0.3–1.2)
Total Protein: 9.1 g/dL — ABNORMAL HIGH (ref 6.5–8.1)

## 2019-10-13 LAB — APTT: aPTT: 23 seconds — ABNORMAL LOW (ref 24–36)

## 2019-10-13 LAB — TYPE AND SCREEN
ABO/RH(D): O POS
Antibody Screen: NEGATIVE

## 2019-10-13 LAB — PROTIME-INR
INR: 1.2 (ref 0.8–1.2)
Prothrombin Time: 15 seconds (ref 11.4–15.2)

## 2019-10-13 LAB — LIPASE, BLOOD: Lipase: 20 U/L (ref 11–51)

## 2019-10-13 LAB — LACTIC ACID, PLASMA: Lactic Acid, Venous: 6.7 mmol/L (ref 0.5–1.9)

## 2019-10-13 LAB — POC SARS CORONAVIRUS 2 AG -  ED: SARS Coronavirus 2 Ag: NEGATIVE

## 2019-10-13 MED ORDER — ACETAMINOPHEN 325 MG PO TABS: 650.0000 mg | ORAL_TABLET | Freq: Four times a day (QID) | ORAL | Status: DC | PRN

## 2019-10-13 MED ORDER — SODIUM CHLORIDE 0.9 % IV SOLN
2.0000 g | INTRAVENOUS | Status: DC
  Filled 2019-10-13: qty 20

## 2019-10-13 MED ORDER — METRONIDAZOLE IN NACL 5-0.79 MG/ML-% IV SOLN
500.0000 mg | Freq: Once | INTRAVENOUS | Status: AC
  Administered 2019-10-13: 500 mg via INTRAVENOUS
  Filled 2019-10-13: qty 100

## 2019-10-13 MED ORDER — STERILE WATER FOR INJECTION IV SOLN
INTRAVENOUS | Status: DC
  Administered 2019-10-14 – 2019-10-17 (×8): via INTRAVENOUS
  Filled 2019-10-13 (×10): qty 850

## 2019-10-13 MED ORDER — MIRTAZAPINE 15 MG PO TABS
7.5000 mg | ORAL_TABLET | Freq: Every day | ORAL | Status: DC
  Administered 2019-10-14: 7.5 mg via ORAL
  Filled 2019-10-13 (×2): qty 1

## 2019-10-13 MED ORDER — PANTOPRAZOLE SODIUM 40 MG PO TBEC: 40.0000 mg | DELAYED_RELEASE_TABLET | Freq: Every day | ORAL | Status: DC

## 2019-10-13 MED ORDER — LACTATED RINGERS IV BOLUS (SEPSIS)
1000.0000 mL | Freq: Once | INTRAVENOUS | Status: AC
  Administered 2019-10-13: 1000 mL via INTRAVENOUS

## 2019-10-13 MED ORDER — SODIUM CHLORIDE 0.9 % IV BOLUS
500.0000 mL | Freq: Once | INTRAVENOUS | Status: AC
  Administered 2019-10-13: 500 mL via INTRAVENOUS

## 2019-10-13 MED ORDER — SODIUM CHLORIDE 0.9 % IV SOLN
500.0000 mg | Freq: Every day | INTRAVENOUS | Status: DC
  Administered 2019-10-14: 500 mg via INTRAVENOUS
  Filled 2019-10-13: qty 0.5
  Filled 2019-10-13: qty 500

## 2019-10-13 MED ORDER — MOMETASONE FURO-FORMOTEROL FUM 100-5 MCG/ACT IN AERO
2.0000 | INHALATION_SPRAY | Freq: Two times a day (BID) | RESPIRATORY_TRACT | Status: DC
  Administered 2019-10-16 – 2019-10-19 (×3): 2 via RESPIRATORY_TRACT
  Filled 2019-10-13 (×2): qty 8.8

## 2019-10-13 MED ORDER — SODIUM BICARBONATE 8.4 % IV SOLN: 50.0000 meq | Freq: Once | INTRAVENOUS | Status: DC

## 2019-10-13 MED ORDER — VITAMIN D 25 MCG (1000 UNIT) PO TABS
1000.0000 [IU] | ORAL_TABLET | Freq: Every day | ORAL | Status: DC
  Administered 2019-10-14 – 2019-10-21 (×7): 1000 [IU] via ORAL
  Filled 2019-10-13 (×7): qty 1

## 2019-10-13 MED ORDER — ACETAMINOPHEN 650 MG RE SUPP: 650.0000 mg | Freq: Four times a day (QID) | RECTAL | Status: DC | PRN

## 2019-10-13 MED ORDER — MEMANTINE HCL 10 MG PO TABS
10.0000 mg | ORAL_TABLET | Freq: Two times a day (BID) | ORAL | Status: DC
  Administered 2019-10-14 – 2019-10-21 (×13): 10 mg via ORAL
  Filled 2019-10-13 (×17): qty 1

## 2019-10-13 MED ORDER — ATORVASTATIN CALCIUM 40 MG PO TABS
40.0000 mg | ORAL_TABLET | Freq: Every day | ORAL | Status: DC
  Administered 2019-10-14 – 2019-10-21 (×7): 40 mg via ORAL
  Filled 2019-10-13 (×7): qty 1

## 2019-10-13 MED ORDER — SODIUM CHLORIDE 0.9 % IV SOLN
2.0000 g | Freq: Once | INTRAVENOUS | Status: DC
  Filled 2019-10-13: qty 2

## 2019-10-13 NOTE — ED Notes (Signed)
Verbal order obtained  From Dr.Long for foot stick, after several unsuccessful peripheral IV stick and US guided IV

## 2019-10-13 NOTE — ED Notes (Signed)
This RN spoke to Admitting Provider to hold Rocephin Infusion until MD orders a different antibiotic.

## 2019-10-13 NOTE — ED Triage Notes (Signed)
Pt here from [facility] via EMS for lethargy and altered mental status. Per facility, pt had N/V/D that started Friday, got a cxr and labs done, but this morning found pt to be more lethargic and less responsive than normal. Pt has hx of dementia and alzheimer's, baseline disoriented but is responsive. Pt had 1 episode of coffee ground emesis for Ems. VSS.

## 2019-10-13 NOTE — ED Notes (Signed)
Lab stated blue tube hemolyzed, will re draw.

## 2019-10-13 NOTE — ED Notes (Addendum)
Bladder scan volume 0 ml. MD Long aware.

## 2019-10-13 NOTE — ED Notes (Signed)
Confirmed Lab is still running COVID-19 Test

## 2019-10-13 NOTE — Progress Notes (Signed)
Pharmacy Antibiotic Note  [name] is a 78 y.o. female admitted on 10/13/2019 with AMS/sepsis.  Pharmacy has been consulted for Meropenem dosing.  Plan: Meropenem 500 mg IV q24h     Temp (24hrs), Avg:97.3 F (36.3 C), Min:96.4 F (35.8 C), Max:98.1 F (36.7 C)  Recent Labs  Lab 10/13/19 2010 10/13/19 2117  WBC 25.5*  --   CREATININE  --  7.23*  LATICACIDVEN 6.7*  --     Estimated Creatinine Clearance: 5.6 mL/min (A) (by C-G formula based on SCr of 7.23 mg/dL (H)).    Allergies  Allergen Reactions  . Lisinopril Other (See Comments)    Reaction:  Respiratory arrest   . Spironolactone Anaphylaxis  . Vancomycin Anaphylaxis and Rash  . Donepezil Diarrhea  . Eggs Or Egg-Derived Products Diarrhea  . Erythromycin Other (See Comments)    Reaction:  Makes pt feel weird   . Macrolides And Ketolides Other (See Comments)    Reaction:  Unknown   . Penicillins Itching and Other (See Comments)    Has patient had a PCN reaction causing immediate rash, facial/tongue/throat swelling, SOB or lightheadedness with hypotension: No Has patient had a PCN reaction causing severe rash involving mucus membranes or skin necrosis: No Has patient had a PCN reaction that required hospitalization No Has patient had a PCN reaction occurring within the last 10 years: No If all of the above answers are "NO", then may proceed with Cephalosporin use.  . Quinapril Hcl Itching  . Angiotensin Receptor Blockers Other (See Comments)    Reaction:  Unknown   . Lipitor [Atorvastatin] Palpitations  . Pentazocine Lactate Other (See Comments)    Reaction:  Unknown   . Propoxyphene Hcl Other (See Comments)    Reaction unknown  . Sulfonamide Derivatives Other (See Comments)    Reaction:  Unknown      [name] 10/13/2019 11:51 PM

## 2019-10-13 NOTE — ED Notes (Signed)
IV team at bedside 

## 2019-10-13 NOTE — H&P (Signed)
History and Physical    [name] [id number] DOB: [birth year] DOA: 10/13/2019  PCP: Hennie Duos, MD Patient coming from: Nursing home  Chief Complaint: Lethargy, altered mental status  HPI: [name] is a 78 y.o. female with medical history significant of Alzheimer's disease, CAD status post PCI, chronic diastolic congestive heart failure, COPD, insulin-dependent diabetes mellitus, hypertension, hyperlipidemia, and conditions listed below presenting to the ED via EMS for evaluation of lethargy and altered mental status.  Facility reported that patient had vomiting and diarrhea that started on Friday.  Chest x-ray and labs were done.  This morning patient was found to be more lethargic/less responsive than normal.  Patient has Alzheimer's dementia and is disoriented at baseline.  Patient had a single episode of coffee-ground emesis 3 days ago which has not recurred.  ED provider spoke to the patient's daughter who confirmed that patient is DNR.  No history could be obtained from the patient due to her advanced dementia.  Awake but not alert.  ED Course: Afebrile.  WBC count 25.5.  Lactic acid 6.7.  UA and urine culture pending.  BUN 106, creatinine 7.2.  Baseline creatinine 0.8.  Bicarb 10, potassium 5.1.  Anion gap 27.  Lipase and LFTs normal.  Rapid SARS-CoV-2 test negative, repeat 6-24-hour test pending.  INR 1.2.  Blood culture x2 pending. Chest x-ray not suggestive of pneumonia. Head CT negative for acute intracranial abnormality. CT abdomen pelvis negative for acute finding. CT chest without pneumonia or pulmonary edema.  Received metronidazole and 2.5 L IV fluid.  Review of Systems:  All systems reviewed and apart from history of presenting illness, are negative.  Past Medical History:  Diagnosis Date   Allergy to ACE inhibitors 03/17/2015   Alzheimer disease ([place])    Anxiety    CAD (coronary artery disease)    a. s/p inferior STEMI 12/29/10 with rotablator  atherectomy RCA 12/31/10 and DES x 2 RCA;  b. Lexiscan Myoview (02/2015): mild reversible apical anterior perfusion defect. C. cath 02/2015 50% LAD lesion with patent stent, Tx Rx     Cardiomyopathy, ischemic 03/17/2015   Chronic diastolic CHF (congestive heart failure) ([place]) 11/12/2016   COPD (chronic obstructive pulmonary disease) ([place])    Coronary atherosclerosis of native coronary artery 04/03/2013   Dementia ([place])    Dementia ([place])    Depression 03/14/2017   Diverticulosis    DM2 (diabetes mellitus, type 2) ([place]) 04/02/2013   Dysphagia 05/10/2017   Minimal 6/20 - pureed diet   Fibromyalgia 04/20/2008   Qualifier: Diagnosis of  By: Harlon Ditty CMA (AAMA), [place]     GERD (gastroesophageal reflux disease)    Glaucoma    HTN (hypertension)    Hyperlipidemia    Ischemic cardiomyopathy    EF 45%cath 2012 EF55% 5/12; 35% 11/14   Memory deficit    Nephrolithiasis    Obesity (BMI 30-39.9) 10/06/2015   Osteoarthritis    Overweight(278.02)    Respiratory arrest//ACE Inhibitor presumed cause    Senile dementia without behavioral disturbance ([place]) 10/06/2015   Sleep apnea    Small bowel obstruction (HCC)    from a sigmoid stricture   Ventricular tachycardia ([place]) 03/17/2015   Vitamin D deficiency 01/28/2017    Past Surgical History:  Procedure Laterality Date   bilateral tubal ligation     CARDIAC CATHETERIZATION N/A 03/18/2015   Procedure: LEFT HEART CATH AND CORONARY ANGIOGRAPHY;  Surgeon: Lorretta Harp, MD;  Location: [facility] CATH LAB;  Service: Cardiovascular;  Laterality: N/A;  CARDIAC SURGERY     HIP ARTHROPLASTY Right 11/10/2016   Procedure: ARTHROPLASTY BIPOLAR HIP (HEMIARTHROPLASTY);  Surgeon: Marchia Bond, MD;  Location: [facility];  Service: Orthopedics;  Laterality: Right;   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 Her smoking use included cigarettes. She has a 25.00  pack-year smoking history. She has never used smokeless tobacco. She reports that she does not drink alcohol or use drugs.  Allergies  Allergen Reactions   Lisinopril Other (See Comments)    Reaction:  Respiratory arrest    Spironolactone Anaphylaxis   Vancomycin Anaphylaxis and Rash   Donepezil Diarrhea   Eggs Or Egg-Derived Products Diarrhea   Erythromycin Other (See Comments)    Reaction:  Makes pt feel weird    Macrolides And Ketolides Other (See Comments)    Reaction:  Unknown    Penicillins Itching and Other (See Comments)    Has patient had a PCN reaction causing immediate rash, facial/tongue/throat swelling, SOB or lightheadedness with hypotension: No Has patient had a PCN reaction causing severe rash involving mucus membranes or skin necrosis: No Has patient had a PCN reaction that required hospitalization No Has patient had a PCN reaction occurring within the last 10 years: No If all of the above answers are "NO", then may proceed with Cephalosporin use.   Quinapril Hcl Itching   Angiotensin Receptor Blockers Other (See Comments)    Reaction:  Unknown    Lipitor [Atorvastatin] Palpitations   Pentazocine Lactate Other (See Comments)    Reaction:  Unknown    Propoxyphene Hcl Other (See Comments)    Reaction unknown   Sulfonamide Derivatives Other (See Comments)    Reaction:  Unknown     Family History  Problem Relation Age of Onset   Diabetes Father    Heart disease Father    Heart attack Father    Stroke Mother    Breast cancer Maternal Grandmother    Diabetes Sister    Hypertension Sister    Colon cancer Neg Hx     Prior to Admission medications   Medication Sig Start Date End Date Taking? Authorizing Provider  aspirin EC 81 MG tablet Take 81 mg by mouth daily.   Yes [provider]  atorvastatin (LIPITOR) 40 MG tablet Take 40 mg by mouth daily.    Yes [provider]  Cholecalciferol (D3-1000) 25 MCG (1000 UT) tablet  Take 6,000 Units by mouth daily.   Yes [provider]  clopidogrel (PLAVIX) 75 MG tablet Take 75 mg by mouth daily.    Yes [provider]  furosemide (LASIX) 20 MG tablet Take 20 mg by mouth daily.    Yes [provider]  insulin glargine (LANTUS) 100 UNIT/ML injection Inject 7 Units into the skin at bedtime.    Yes [provider]  insulin lispro (HUMALOG KWIKPEN) 100 UNIT/ML KiwkPen Inject 4 Units into the skin 3 (three) times daily as needed (CBG >300).    Yes [provider]  isosorbide mononitrate (IMDUR) 30 MG 24 hr tablet Take 0.5 tablets (15 mg total) by mouth daily. 10/25/15  Yes Isaiah Serge, NP  loperamide (IMODIUM A-D) 2 MG capsule Take 4 mg by mouth as needed for diarrhea or loose stools.   Yes [provider]  memantine (NAMENDA) 10 MG tablet Take 10 mg by mouth 2 (two) times daily.    Yes [provider]  metoprolol succinate (TOPROL XL) 25 MG 24 hr tablet Take 0.5 tablets (12.5 mg total) by mouth daily. 02/05/16  Yes Donne Hazel, MD  mirtazapine (REMERON) 7.5 MG tablet Take 7.5 mg by mouth at bedtime.    Yes [provider]  mometasone-formoterol (DULERA) 100-5 MCG/ACT AERO Inhale 2 puffs into the lungs 2 (two) times daily.    Yes [provider]  NUTRITIONAL SUPPLEMENTS PO Take 1 each by mouth See admin instructions. Magic Cup with lunch and dinner meal   Yes [provider]  pantoprazole (PROTONIX) 40 MG tablet Take 40 mg by mouth daily.   Yes [provider]  traMADol (ULTRAM) 50 MG tablet Take 1 tablet (50 mg total) by mouth 2 (two) times daily. Back pain 08/18/19  Yes Hennie Duos, MD  traZODone (DESYREL) 50 MG tablet Take 25 mg by mouth at bedtime.    Yes [provider]  sennosides-docusate sodium (SENOKOT-S) 8.6-50 MG tablet Take 2 tablets by mouth daily. Patient not taking: Reported on 10/13/2019 11/10/16   Marchia Bond, MD    Physical Exam: Vitals:    10/13/19 2030 10/13/19 2045 10/13/19 2119 10/13/19 2333  BP: (!) 116/58 114/63 118/68 (!) 132/51  Pulse: 96 97    Resp: 13 14 13 13   Temp:      TempSrc:      SpO2: 99% 98%      Physical Exam  Constitutional: She appears well-developed and well-nourished. No distress.  HENT:  Head: Normocephalic.  Dry mucous membranes  Eyes: Right eye exhibits no discharge. Left eye exhibits no discharge.  Neck: Neck supple.  Cardiovascular: Normal rate, regular rhythm and intact distal pulses.  Pulmonary/Chest: Effort normal and breath sounds normal. No respiratory distress. She has no wheezes. She has no rales.  Abdominal: Soft. Bowel sounds are normal. She exhibits no distension. There is no abdominal tenderness. There is no guarding.  Musculoskeletal:        General: No edema.  Neurological:  Awake but not alert  Skin: Skin is warm and dry. She is not diaphoretic.     Labs on Admission: I have personally reviewed following labs and imaging studies  CBC: Recent Labs  Lab 10/13/19 2010  WBC 25.5*  NEUTROABS 23.2*  HGB 15.3*  HCT 48.1*  MCV 101.5*  PLT 123456   Basic Metabolic Panel: Recent Labs  Lab 10/13/19 2117  NA 143  K 5.1  CL 106  CO2 10*  GLUCOSE 189*  BUN 106*  CREATININE 7.23*  CALCIUM 10.1   GFR: Estimated Creatinine Clearance: 5.6 mL/min (A) (by C-G formula based on SCr of 7.23 mg/dL (H)). Liver Function Tests: Recent Labs  Lab 10/13/19 2117  AST 29  ALT 16  ALKPHOS 105  BILITOT 0.6  PROT 9.1*  ALBUMIN 3.5   Recent Labs  Lab 10/13/19 2117  LIPASE 20   No results for input(s): AMMONIA in the last 168 hours. Coagulation Profile: Recent Labs  Lab 10/13/19 2159  INR 1.2   Cardiac Enzymes: No results for input(s): CKTOTAL, CKMB, CKMBINDEX, TROPONINI in the last 168 hours. BNP (last 3 results) No results for input(s): PROBNP in the last 8760 hours. HbA1C: No results for input(s): HGBA1C in the last 72 hours. CBG: No results for input(s): GLUCAP  in the last 168 hours. Lipid Profile: No results for input(s): CHOL, HDL, LDLCALC, TRIG, CHOLHDL, LDLDIRECT in the last 72 hours. Thyroid Function Tests: No results for input(s): TSH, T4TOTAL, FREET4, T3FREE, THYROIDAB in the last 72  hours. Anemia Panel: No results for input(s): VITAMINB12, FOLATE, FERRITIN, TIBC, IRON, RETICCTPCT in the last 72 hours. Urine analysis:    Component Value Date/Time   COLORURINE YELLOW 11/13/2016 [place] 11/13/2016 1325   LABSPEC 1.017 11/13/2016 1325   PHURINE 5.0 11/13/2016 1325   GLUCOSEU 50 (A) 11/13/2016 1325   HGBUR NEGATIVE 11/13/2016 1325   BILIRUBINUR NEGATIVE 11/13/2016 1325   KETONESUR NEGATIVE 11/13/2016 1325   PROTEINUR NEGATIVE 11/13/2016 1325   UROBILINOGEN 0.2 01/23/2014 2343   NITRITE NEGATIVE 11/13/2016 1325   LEUKOCYTESUR NEGATIVE 11/13/2016 1325    Radiological Exams on Admission: Ct Abdomen Pelvis Wo Contrast  Result Date: 10/13/2019 CLINICAL DATA:  Cough with lethargy, recent cough ground emesis EXAM: CT CHEST, ABDOMEN AND PELVIS WITHOUT CONTRAST TECHNIQUE: Multidetector CT imaging of the chest, abdomen and pelvis was performed following the standard protocol without IV contrast. COMPARISON:  12/31/2014, 05/06/2015 FINDINGS: CT CHEST FINDINGS Cardiovascular: Somewhat limited due to lack of IV contrast. Diffuse aortic calcifications are noted. No aneurysmal dilatation is seen. No cardiac enlargement is noted. Heavy coronary calcifications are noted. Mediastinum/Nodes: The thoracic inlet is within normal limits. No hilar or mediastinal adenopathy is noted. The esophagus as visualized is within normal limits. Lungs/Pleura: Scattered tiny parenchymal nodules are noted bilaterally. These are best visualized on images 53, 92, 52 and 32 of series 5. Some of these were seen on the prior exam. They all measure less than 5 mm. No focal infiltrate or sizable effusion is seen. Mild scarring in the lingula is again seen.  Musculoskeletal: Degenerative changes of the thoracic spine are seen. CT ABDOMEN PELVIS FINDINGS Hepatobiliary: No focal liver abnormality is seen. No gallstones, gallbladder wall thickening, or biliary dilatation. Pancreas: Unremarkable. No pancreatic ductal dilatation or surrounding inflammatory changes. Spleen: Normal in size without focal abnormality. Adrenals/Urinary Tract: Adrenal glands are within normal limits. The kidneys are well visualized bilaterally. No renal calculi or obstructive changes are seen. Previously seen left renal calculus is not well appreciated on today's exam. The bladder is decompressed. Stomach/Bowel: Changes consistent with prior sigmoid surgery are noted with patent anastomosis. The colon is otherwise unremarkable. The appendix is within normal limits. Vascular/Lymphatic: Aortic atherosclerosis. No enlarged abdominal or pelvic lymph nodes. Reproductive: Uterus and bilateral adnexa are unremarkable. Other: No abdominal wall hernia or abnormality. No abdominopelvic ascites. Musculoskeletal: Right hip replacement is noted. Degenerative changes of the lumbar spine are seen. L1 and L5 compression deformities are seen which appear chronic in nature. The L1 fracture is new from the prior exam. IMPRESSION: CT of the chest: Scattered small parenchymal nodules some of which are stable from the previous exam from 4 years previous. They all measure less than 5 mm. No follow-up needed if patient is low-risk (and has no known or suspected primary neoplasm). Non-contrast chest CT can be considered in 12 months if patient is high-risk. This recommendation follows the consensus statement: Guidelines for Management of Incidental Pulmonary Nodules Detected on CT Images: From the Fleischner Society 2017; Radiology 2017; 284:228-243. CT of the abdomen and pelvis: Previously seen left renal stone is not well appreciated on today's exam. Chronic changes as described without acute abnormality. Aortic  Atherosclerosis (ICD10-I70.0). Electronically Signed   By: Inez Catalina M.D.   On: 10/13/2019 20:39   Ct Head Wo Contrast  Result Date: 10/13/2019 CLINICAL DATA:  Encephalopathy EXAM: CT HEAD WITHOUT CONTRAST TECHNIQUE: Contiguous axial images were obtained from the base of the skull through the vertex without intravenous contrast. COMPARISON:  None.  FINDINGS: Brain: There is no mass, hemorrhage or extra-axial collection. There is generalized atrophy without lobar predilection. Hypodensity of the white matter is most commonly associated with chronic microvascular disease. Vascular: No abnormal hyperdensity of the major intracranial arteries or dural venous sinuses. No intracranial atherosclerosis. Skull: The visualized skull base, calvarium and extracranial soft tissues are normal. Sinuses/Orbits: No fluid levels or advanced mucosal thickening of the visualized paranasal sinuses. No mastoid or middle ear effusion. The orbits are normal. IMPRESSION: Generalized atrophy and chronic microvascular ischemia without acute intracranial abnormality. Electronically Signed   By: Ulyses Jarred M.D.   On: 10/13/2019 20:34   Ct Chest Wo Contrast  Result Date: 10/13/2019 CLINICAL DATA:  Cough with lethargy, recent cough ground emesis EXAM: CT CHEST, ABDOMEN AND PELVIS WITHOUT CONTRAST TECHNIQUE: Multidetector CT imaging of the chest, abdomen and pelvis was performed following the standard protocol without IV contrast. COMPARISON:  12/31/2014, 05/06/2015 FINDINGS: CT CHEST FINDINGS Cardiovascular: Somewhat limited due to lack of IV contrast. Diffuse aortic calcifications are noted. No aneurysmal dilatation is seen. No cardiac enlargement is noted. Heavy coronary calcifications are noted. Mediastinum/Nodes: The thoracic inlet is within normal limits. No hilar or mediastinal adenopathy is noted. The esophagus as visualized is within normal limits. Lungs/Pleura: Scattered tiny parenchymal nodules are noted bilaterally.  These are best visualized on images 53, 92, 52 and 32 of series 5. Some of these were seen on the prior exam. They all measure less than 5 mm. No focal infiltrate or sizable effusion is seen. Mild scarring in the lingula is again seen. Musculoskeletal: Degenerative changes of the thoracic spine are seen. CT ABDOMEN PELVIS FINDINGS Hepatobiliary: No focal liver abnormality is seen. No gallstones, gallbladder wall thickening, or biliary dilatation. Pancreas: Unremarkable. No pancreatic ductal dilatation or surrounding inflammatory changes. Spleen: Normal in size without focal abnormality. Adrenals/Urinary Tract: Adrenal glands are within normal limits. The kidneys are well visualized bilaterally. No renal calculi or obstructive changes are seen. Previously seen left renal calculus is not well appreciated on today's exam. The bladder is decompressed. Stomach/Bowel: Changes consistent with prior sigmoid surgery are noted with patent anastomosis. The colon is otherwise unremarkable. The appendix is within normal limits. Vascular/Lymphatic: Aortic atherosclerosis. No enlarged abdominal or pelvic lymph nodes. Reproductive: Uterus and bilateral adnexa are unremarkable. Other: No abdominal wall hernia or abnormality. No abdominopelvic ascites. Musculoskeletal: Right hip replacement is noted. Degenerative changes of the lumbar spine are seen. L1 and L5 compression deformities are seen which appear chronic in nature. The L1 fracture is new from the prior exam. IMPRESSION: CT of the chest: Scattered small parenchymal nodules some of which are stable from the previous exam from 4 years previous. They all measure less than 5 mm. No follow-up needed if patient is low-risk (and has no known or suspected primary neoplasm). Non-contrast chest CT can be considered in 12 months if patient is high-risk. This recommendation follows the consensus statement: Guidelines for Management of Incidental Pulmonary Nodules Detected on CT Images:  From the Fleischner Society 2017; Radiology 2017; 284:228-243. CT of the abdomen and pelvis: Previously seen left renal stone is not well appreciated on today's exam. Chronic changes as described without acute abnormality. Aortic Atherosclerosis (ICD10-I70.0). Electronically Signed   By: Inez Catalina M.D.   On: 10/13/2019 20:39   Dg Chest Port 1 View  Result Date: 10/13/2019 CLINICAL DATA:  Chest pain and shortness of breath EXAM: PORTABLE CHEST 1 VIEW COMPARISON:  11/10/2016 FINDINGS: Cardiac shadow is stable. Aortic calcifications are again seen. The  lungs are well aerated bilaterally with the exception of stable left basilar scarring and effusion no new focal infiltrate is seen. No acute bony abnormality is noted. IMPRESSION: Chronic scarring in the left base.  No acute abnormality seen. Electronically Signed   By: Inez Catalina M.D.   On: 10/13/2019 19:58    EKG: Pending at this time.  Assessment/Plan Principal Problem:   Acute renal failure (ARF) (HCC) Active Problems:   Lactic acidosis   Metabolic acidosis   Acute metabolic encephalopathy   Viral gastroenteritis   Acute renal failure, severe metabolic acidosis Suspect prerenal due to severe dehydration in the setting of emesis/diarrhea and home Lasix use.  Bladder scan done in the ED did not reveal any urine output.  BUN 106, creatinine 7.2.  Baseline creatinine 0.8.  Bicarb 10, anion gap 27, and potassium 5.1.  CT without evidence of renal calculi or obstruction. -Received 2.5 L IV fluid boluses in the ED.  Continue hydration with continuous bicarb infusion. -Avoid nephrotoxic agents/contrast -Check urine sodium, creatinine -Renal ultrasound -Continue to monitor renal function and urine output closely -ED provider discussed the case with nephrology, recommended bicarb supplementation and reconsulting in a.m. if renal function does not improve.  Lactic acidosis/ ?sepsis from unknown source Lactic acid 6.7.  Suspect related to uremia  in the setting of acute renal failure.  Infectious etiology also possible given leukocytosis.  Although patient is afebrile and no other signs of sepsis such as tachycardia or hypotension.  Possible infectious source could be UTI (UA currently pending) versus infected pressure ulcer.  Per nursing staff, she has stage II pressure ulcers which I could not evaluate at this time as patient was extremely uncomfortable when we tried to reposition her in the bed.  Chest x-ray not suggestive of pneumonia.  CT abdomen pelvis without infectious source.  Rapid SARS-CoV-2 test negative. -IV fluid boluses given in the ED.  Continue IV fluid hydration and continue to trend lactate. -At this time, will cover broadly for sepsis from unknown source.  Patient has multiple antibiotic allergies and case was discussed with pharmacy.  Currently  as has 2 sites of IV access after multiple attempts in the ED (needs IV fluid and bicarb infusion).  Will continue antibiotic coverage with meropenem.  Check procalcitonin level. -UA and urine culture pending -Repeat SARS-CoV-2 test pending, continue airborne and contact precautions -Blood culture x2 pending -Continue to monitor WBC count  Possible viral gastroenteritis Nursing home reported emesis and diarrhea.  Lipase and LFTs normal.  CT abdomen pelvis negative for acute finding.  ?C. difficile infection given significant leukocytosis although CT without evidence of colitis. -Broad-spectrum antibiotic coverage as above -GI pathogen panel, C. difficile PCR -Phenergan as needed  Acute metabolic encephalopathy Suspect related to uremia in the setting of acute renal failure vs possible infection.  Head CT negative.  Patient has advanced dementia and is disoriented at baseline.  Currently awake. -Management as mentioned above -Continue to monitor mental status -Hold sedating medication  Concern for possible upper GI bleed Per triage note, EMS reported an episode of  coffee-ground emesis.  Per ED provider documentation, patient had one episode of coffee-ground emesis 3 days ago at her nursing home and no additional episodes since then.  Hemoglobin stable at 15.3.  No prior EGD results in the chart. -IV PPI -Continue to monitor overnight.  Will hold home aspirin and Plavix at this time.  Hold off giving anticoagulation for DVT prophylaxis at this time.  If patient remains stable  overnight, consider resuming antiplatelet agents and anticoagulation in the morning.  Please obtain records from the nursing home to determine if patient has previously had an EGD done.  CAD status post PCI -Hold aspirin and Plavix at this time as mentioned above due to concern for possible upper GI bleed.  Pulmonary nodules CT chest showing scattered small parenchymal nodules (all measuring less than 5 mm) which appear stable from previous examination from 4 years ago.  Patient is a former smoker. -Noncontrast chest CT for monitoring in 12 months  Chronic diastolic congestive heart failure -Volume depleted at this time.  Hold diuretics  Hypertension -Normotensive.  Hold home antihypertensives at this time due to concern for possible sepsis.  Hyperlipidemia -Continue home Lipitor  COPD -Stable.  No bronchospasm.  Continue home inhaler.  Insulin-dependent diabetes mellitus Blood glucose 189 on labs done in the ED. -Check A1c.  Sliding scale insulin very sensitive and CBG checks.  Alzheimer's dementia -Continue home Namenda  DVT prophylaxis: SCDs Code Status: DNR per ED provider's conversation with the patient's daughter. Family Communication: No family available at this time. Disposition Plan: Anticipate discharge after clinical improvement. Consults called: Nephrology Admission status: It is my clinical opinion that admission to INPATIENT is reasonable and necessary in this 78 y.o. female  presenting with acute renal failure, metabolic acidosis, lactic acidosis, and  concern for sepsis.  Very high risk of decompensation.  Given the aforementioned, the predictability of an adverse outcome is felt to be significant. I expect that the patient will require at least 2 midnights in the hospital to treat this condition.   The medical decision making on this patient was of high complexity and the patient is at high risk for clinical deterioration, therefore this is a level 3 visit.  Shela Leff MD [facility] Pager [phone]  If 7PM-7AM, please contact night-coverage [email or web address] Password [email or web address]  10/14/2019, 12:40 AM

## 2019-10-13 NOTE — Progress Notes (Signed)
Location: [place] Room Number: 202-W Place of Service:  SNF [phone]) Provider:  Granville Lewis, PA-C  Patient Care Team: Hennie Duos, MD as PCP - General (Internal Medicine)  Extended Emergency Contact Information Primary Emergency Contact: [name],[name] Address: [address]          [place], [place] [address] [name] of [place] Phone: [phone] Work Phone: [phone] Mobile Phone: [phone] Relation: Daughter Secondary Emergency Contact: [name],[name]  [place] Phone: [phone] Relation: Niece  Code Status:  DNR Goals of care: Advanced Directive information Advanced Directives 09/29/2019  Does Patient Have a Medical Advance Directive? Yes  Type of Advance Directive Out of facility DNR (pink MOST or yellow form)  Does patient want to make changes to medical advance directive? -  Copy of [place] in Chart? -  Would patient like information on creating a medical advance directive? -  Pre-existing out of facility DNR order (yellow form or pink MOST form) Yellow form placed in chart (order not valid for inpatient use)     Chief Complaint  Patient presents with  . Acute Visit    Patient seen for vomiting and diarrhea    HPI:  Pt is a 78 y.o. female seen today for an acute visit for follow-up of vomiting and diarrhea.  Apparently patient had some diarrhea over the weekend-and this morning had a vomiting episode.  Nursing staff feels patient just does not look like herself today and I am following up on this  Vital signs are stable she is afebrile she is a poor historian secondary to dementia.  Apparently no complaints of acute type  abdominal pain shortness of breath or chest pain have been noted.  Patient is a long-term resident of the facility with a history of dementia as well as type 2 diabetes on Lantus and Glucophage and Humalog as well as COPD and diastolic CHF.  Apparently she developed  the diarrhea over the weekend and today appears to be feeling weak and tired she is alert and responsive but does not appear to have her usual energy-basically lying in bed.  Nursing staff has checked for impaction  which was negative-there was some thought previously when she has had a vomiting episode that constipation was an issue.  Her laxatives are currently on hold because of the diarrhea     Past Medical History:  Diagnosis Date  . Allergy to ACE inhibitors 03/17/2015  . Anxiety   . CAD (coronary artery disease)    a. s/p inferior STEMI 12/29/10 with rotablator atherectomy RCA 12/31/10 and DES x 2 RCA;  b. Lexiscan Myoview (02/2015): mild reversible apical anterior perfusion defect. C. cath 02/2015 50% LAD lesion with patent stent, Tx Rx    . Cardiomyopathy, ischemic 03/17/2015  . Chronic diastolic CHF (congestive heart failure) ([place]) 11/12/2016  . COPD (chronic obstructive pulmonary disease) ([place])   . Coronary atherosclerosis of native coronary artery 04/03/2013  . Dementia ([facility] [facility])   . Depression 03/14/2017  . Diverticulosis   . DM2 (diabetes mellitus, type 2) ([place]) 04/02/2013  . Dysphagia 05/10/2017   Minimal 6/20 - pureed diet  . Fibromyalgia 04/20/2008   Qualifier: Diagnosis of  By: Nelson-Smith CMA (AAMA), Dottie    . GERD (gastroesophageal reflux disease)   . Glaucoma   . HTN (hypertension)   . Hyperlipidemia   . Ischemic cardiomyopathy    EF 45%cath 2012 EF55% 5/12; 35% 11/14  . Memory deficit   . Nephrolithiasis   .  Obesity (BMI 30-39.9) 10/06/2015  . Osteoarthritis   . Overweight(278.02)   . Respiratory arrest//ACE Inhibitor presumed cause   . Senile dementia without behavioral disturbance ([place]) 10/06/2015  . Sleep apnea   . Small bowel obstruction ([facility])    from a sigmoid stricture  . Ventricular tachycardia ([place]) 03/17/2015  . Vitamin D deficiency 01/28/2017   Past Surgical History:  Procedure Laterality Date  . bilateral tubal ligation    . CARDIAC CATHETERIZATION  N/A 03/18/2015   Procedure: LEFT HEART CATH AND CORONARY ANGIOGRAPHY;  Surgeon: Lorretta Harp, MD;  Location: [facility] CATH LAB;  Service: Cardiovascular;  Laterality: N/A;  . CARDIAC SURGERY    . HIP ARTHROPLASTY Right 11/10/2016   Procedure: ARTHROPLASTY BIPOLAR HIP (HEMIARTHROPLASTY);  Surgeon: Marchia Bond, MD;  Location: [facility];  Service: Orthopedics;  Laterality: Right;  . 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 LITHOTRIPSY      Allergies  Allergen Reactions  . Lisinopril Other (See Comments)    Reaction:  Respiratory arrest   . Spironolactone Anaphylaxis  . Vancomycin Anaphylaxis and Rash  . Donepezil Diarrhea  . Eggs Or Egg-Derived Products Diarrhea  . Erythromycin Other (See Comments)    Reaction:  Makes pt feel weird   . Macrolides And Ketolides Other (See Comments)    Reaction:  Unknown   . Penicillins Itching and Other (See Comments)    Has patient had a PCN reaction causing immediate rash, facial/tongue/throat swelling, SOB or lightheadedness with hypotension: No Has patient had a PCN reaction causing severe rash involving mucus membranes or skin necrosis: No Has patient had a PCN reaction that required hospitalization No Has patient had a PCN reaction occurring within the last 10 years: No If all of the above answers are "NO", then may proceed with Cephalosporin use.  . Quinapril Hcl Itching  . Angiotensin Receptor Blockers Other (See Comments)    Reaction:  Unknown   . Lipitor [Atorvastatin] Palpitations  . Pentazocine Lactate Other (See Comments)    Reaction:  Unknown   . Propoxyphene Hcl Other (See Comments)    Reaction unknown  . Sulfonamide Derivatives Other (See Comments)    Reaction:  Unknown     Outpatient Encounter Medications as of 10/13/2019  Medication Sig  . aspirin EC 81 MG tablet Take 81 mg by mouth daily.  Marland Kitchen atorvastatin (LIPITOR) 40 MG tablet Take 40 mg by mouth daily.   . Cholecalciferol (D3-1000) 25 MCG (1000 UT)  tablet Take 6,000 Units by mouth daily.  . clopidogrel (PLAVIX) 75 MG tablet Take 75 mg by mouth daily.   . furosemide (LASIX) 20 MG tablet Take 20 mg by mouth.  . insulin glargine (LANTUS) 100 UNIT/ML injection Inject 15 Units into the skin at bedtime. If blood sugar is > 150  . insulin lispro (HUMALOG KWIKPEN) 100 UNIT/ML KiwkPen Inject 4 Units into the skin 3 (three) times daily with meals.   . isosorbide mononitrate (IMDUR) 30 MG 24 hr tablet Take 0.5 tablets (15 mg total) by mouth daily.  . Loperamide HCl (IMODIUM A-D PO) Take 4 mg by mouth daily as needed (loose stool).  . memantine (NAMENDA) 10 MG tablet Take 10 mg by mouth 2 (two) times daily.   . metFORMIN (GLUCOPHAGE) 1000 MG tablet Take 1,000 mg by mouth 2 (two) times daily with a meal.   . metoprolol succinate (TOPROL XL) 25 MG 24 hr tablet Take 0.5 tablets (12.5 mg total) by mouth daily.  . mirtazapine (REMERON)  7.5 MG tablet Take 7.5 mg by mouth at bedtime.   . mometasone-formoterol (DULERA) 100-5 MCG/ACT AERO Inhale 2 puffs into the lungs 2 (two) times daily.   Marland Kitchen NUTRITIONAL SUPPLEMENTS PO Take 1 each by mouth 2 (two) times a day. Magic Cup with lunch and dinner meal  . pantoprazole (PROTONIX) 40 MG tablet Take 40 mg by mouth daily.  . sennosides-docusate sodium (SENOKOT-S) 8.6-50 MG tablet Take 2 tablets by mouth daily.  . traMADol (ULTRAM) 50 MG tablet Take 1 tablet (50 mg total) by mouth 2 (two) times daily. Back pain  . traZODone (DESYREL) 50 MG tablet Take 25 mg by mouth at bedtime.   . [DISCONTINUED] Cholecalciferol (D3-50) 1.25 MG (50000 UT) capsule Take 50,000 Units by mouth once a week.   . [DISCONTINUED] LORazepam (ATIVAN) 1 MG tablet :Give 1 tablet by mouth prior to Dental Procdure as directed by the [facility] Staff on 11/ 08/2019  . [DISCONTINUED] ondansetron (ZOFRAN) 4 MG tablet Take 4 mg by mouth every 6 (six) hours as needed for nausea or vomiting.  . [DISCONTINUED] polyethylene glycol (MIRALAX / GLYCOLAX)  packet Take 17 g by mouth daily as needed for mild constipation.   No facility-administered encounter medications on file as of 10/13/2019.     Review of Systems   This is largely unobtainable secondary to dementia please see HPI-nursing does not report any complaints of chest pain or shortness of breath or acute abdominal discomfort---staff noted diarrhea over the weekend and did have a vomiting episode this morning.  Patient herself does not really express any specific complaints today but she does appear weaker and not herself  Immunization History  Administered Date(s) Administered  . Influenza Split 08/20/2014  . Influenza, High Dose Seasonal PF 10/06/2013  . Influenza-Unspecified 10/17/2017, 08/29/2018, 08/27/2019  . PPD Test 11/16/2016, 12/05/2016  . Pneumococcal Conjugate-13 10/17/2017  . Pneumococcal Polysaccharide-23 09/24/2009  . Tdap 10/18/2017   Pertinent  Health Maintenance Due  Topic Date Due  . FOOT EXAM  11/21/2019 (Originally 11/03/1951)  . OPHTHALMOLOGY EXAM  09/08/2020 (Originally 11/03/1951)  . HEMOGLOBIN A1C  01/01/2020  . URINE MICROALBUMIN  04/30/2020  . INFLUENZA VACCINE  Completed  . DEXA SCAN  Completed  . PNA vac Low Risk Adult  Completed   Fall Risk  04/19/2018  Falls in the past year? No   Functional Status Survey:    Vital signs are temperature 96.8 pulse 90 respirations 20 blood pressure 109/74 oxygen saturation is 97% on room air Body mass index is 21.77 kg/m. Physical Exam  In general this is a fairly well-nourished elderly female she is in no distress but does appear to not be feeling well.  Her skin is warm and dry she is not diaphoretic.  Eyes sclera and conjunctive are clear visual acuity appears grossly intact.  Oropharynx was somewhat difficult to assess since patient did not really open her mouth on command but because membranes appear to be fairly moist from what I could see.  Chest was clear to auscultation with poor  respiratory effort she did not really follow verbal commands but could not appreciate any labored breathing or overt congestion.  Heart is regular rate and rhythm without murmur gallop or rub it is slightly tachycardic at just over 100 initial auscultation but was around 90 on recheck  She does not appear to have significant lower extremity edema.  Abdomen is soft does not appear to be tender there are hyperactive bowel sounds diffuse-initially has some reaction to  the invasive maneuver but after that did not really appear to notice when I was palpating her abdomen.  GU could not appreciate suprapubic tenderness.  Musculoskeletal Limited exam since he is in bed but appears able to move her extremities x4 at baseline.  Neurologic she is alert could not really appreciate lateralizing findings her speech was clear a  she does not really follow verbal commands  Psych findings consistent with significant dementia she is oriented to self does not really follow verbal commands but this is not new she is not really agitated with exam but occasionally will react to some invasive maneuvers.     Labs reviewed:  October 07, 2019.  Sodium 144 potassium 5 BUN 26.2 creatinine 0.94.  July 01, 2019.  WBC 9.6 hemoglobin 13.2 platelets 156.  Hemoglobin A1c 6.5.  Apr 03, 2019.  Liver function tests were within normal limits   Recent Labs    07/16/19 07/21/19 07/28/19  NA 146 141 144  K 3.7 4.4 4.2  BUN 22* 24* 27*  CREATININE 0.9 0.8 0.8   Recent Labs    10/14/18 04/03/19  AST 12* 13  ALT 12 5*  ALKPHOS 81 65   Recent Labs    10/14/18 07/01/19  WBC 9.2 9.0  NEUTROABS  --  5  HGB 13.2 13.2  HCT 39 40  PLT 172 156   Lab Results  Component Value Date   TSH 4.91 10/21/2018   Lab Results  Component Value Date   HGBA1C 6.5 07/01/2019   Lab Results  Component Value Date   CHOL 127 10/14/2018   HDL 37 10/14/2018   LDLCALC 69 10/14/2018   TRIG 106 10/14/2018   CHOLHDL 2  12/19/2012    Significant Diagnostic Results in last 30 days:  No results found.  Assessment/Plan  #1 diarrhea and vomiting-unclear etiology-patient is not in any distress but she does appear to be herself appears less energetic not to be feeling generally well.  Order an abdominal x-ray as well as chest x-ray.  Also C. difficile any loose stools.  We will obtain a CBC with differential CMP amylase and lipase stat notify provider of results.  Also she will need monitoring her vital signs with pulse ox every shift to keep an eye on her.  And will switch to a clear liquid diet x24 hours.  In regards to her diabetes since her p.o. intake will probably be down somewhat we will decrease her Lantus down to 7 units-will not give her her Humalog with meals unless her CBG is greater than 300 she gets 4 units at that point.  We will hold her Glucophage as well.  Blood sugar this morning was 185-appears over the weekend were generally running in the mid 100s to lower 200s although there was a 72 last night  Also will obtain a COVID-19 test.  Clinically she does not appear to be in any distress or unstable but she is not herself this morning does not appear to be feeling very well will await results of the studies and the labs and continue to monitor.  ADDENDUM--- we have obtained the updated labs which are concerning for acute renal failure-creatinine is 6.06 BUN of 91.8 CO2 level was 8 and potassium level was 5.1.  She also has significant leukocytosis with a white count of 21.7---hemoglobin is 15.3 platelets 207,000-amylase is normal at 31 lipase is depressed at 8  Abdominal x-ray did not show any acute process chest x-ray did show a left lower  lobe infiltrate with left-sided effusion thought to be grossly unchanged from x-ray done in August 2019  Clinically she appears to be stable but quite weak we will send her to the hospital for expedient evaluation   Secondary to acute renal  failure-leukocytosis  [place], PA-C [phone]

## 2019-10-13 NOTE — ED Notes (Addendum)
MD Long at bedside attempting US guided IV, stated he does not see anything, gave permission to start IV in left foot and stated it was okay to start with LR and hold off on abx until other access available. MD also aware this RN unable to in and out cath due to bladder volume being 0.

## 2019-10-13 NOTE — ED Provider Notes (Addendum)
Emergency Department Provider Note   I have reviewed the triage vital signs and the nursing notes.   HISTORY  Chief Complaint Altered Mental Status   HPI [name] is a 78 y.o. female with past medical history reviewed below including Alzheimer's dementia presents from [facility] with report of altered mental status.  Staff described the patient as more "lethargic" and "less responsive."  I reviewed the progress note from today where the PA with [facility] saw the patient.  Patient was having some vomiting and diarrhea symptoms over the weekend including a single episode of coffee-ground emesis 3 days ago which has not recurred.  Diarrhea has continued throughout the weekend with decreased alertness compared to her baseline.  There was no evidence of fecal impaction by report and laxatives were held.  Apparently, the patient had blood work which showed significantly elevated BUN and creatinine and the patient was sent for further evaluation.  Staff denies any fever.   Level 5 caveat: Dementia and acute delirium.   Past Medical History:  Diagnosis Date   Allergy to ACE inhibitors 03/17/2015   Alzheimer disease ([place])    Anxiety    CAD (coronary artery disease)    a. s/p inferior STEMI 12/29/10 with rotablator atherectomy RCA 12/31/10 and DES x 2 RCA;  b. Lexiscan Myoview (02/2015): mild reversible apical anterior perfusion defect. C. cath 02/2015 50% LAD lesion with patent stent, Tx Rx     Cardiomyopathy, ischemic 03/17/2015   Chronic diastolic CHF (congestive heart failure) ([employer]) 11/12/2016   COPD (chronic obstructive pulmonary disease) ([employer])    Coronary atherosclerosis of native coronary artery 04/03/2013   Dementia ([place])    Dementia ([place])    Depression 03/14/2017   Diverticulosis    DM2 (diabetes mellitus, type 2) ([place]) 04/02/2013   Dysphagia 05/10/2017   Minimal 6/20 - pureed diet   Fibromyalgia 04/20/2008   Qualifier: Diagnosis of  By:  Harlon Ditty CMA (AAMA), [place]     GERD (gastroesophageal reflux disease)    Glaucoma    HTN (hypertension)    Hyperlipidemia    Ischemic cardiomyopathy    EF 45%cath 2012 EF55% 5/12; 35% 11/14   Memory deficit    Nephrolithiasis    Obesity (BMI 30-39.9) 10/06/2015   Osteoarthritis    Overweight(278.02)    Respiratory arrest//ACE Inhibitor presumed cause    Senile dementia without behavioral disturbance ([place]) 10/06/2015   Sleep apnea    Small bowel obstruction ([place])    from a sigmoid stricture   Ventricular tachycardia ([place]) 03/17/2015   Vitamin D deficiency 01/28/2017    Patient Active Problem List   Diagnosis Date Noted   AKI (acute kidney injury) ([place]) 10/13/2019   Hypertension associated with type 2 diabetes mellitus ([place]) 03/06/2019   Dyslipidemia associated with type 2 diabetes mellitus ([place]) 03/06/2019   Acquired hypothyroidism 03/06/2019   Vascular dementia without behavioral disturbance ([place]) 03/06/2019   Insomnia 08/06/2018   GERD (gastroesophageal reflux disease) 08/04/2017   Dysphagia 05/10/2017   Depression 03/14/2017   Vitamin D deficiency 01/28/2017   UTI due to extended-spectrum beta lactamase (ESBL) producing Escherichia coli 11/18/2016   Urinary tract infection due to Proteus 11/18/2016   Postoperative anemia due to acute blood loss 11/18/2016   HTN (hypertension) 11/18/2016   Closed right hip fracture ([employer]) 11/10/2016   Senile dementia without behavioral disturbance ([place]) 10/06/2015   Obesity (BMI 30-39.9) 10/06/2015   Tobacco use disorder 03/17/2015   Cardiomyopathy, ischemic 03/17/2015   Allergy  to ACE inhibitors 03/17/2015   Ventricular tachycardia ([place]) 03/17/2015   HLD (hyperlipidemia) 12/31/2014   COPD (chronic obstructive pulmonary disease) ([place]) 12/31/2014   Chronic cough 09/12/2013   Memory deficit 04/23/2013   Essential hypertension, benign 04/03/2013   Dyslipidemia 04/03/2013   Coronary  atherosclerosis of native coronary artery 04/03/2013   DM2 (diabetes mellitus, type 2) ([place]) 04/02/2013   Fibromyalgia 04/20/2008    Past Surgical History:  Procedure Laterality Date   bilateral tubal ligation     CARDIAC CATHETERIZATION N/A 03/18/2015   Procedure: LEFT HEART CATH AND CORONARY ANGIOGRAPHY;  Surgeon: Lorretta Harp, MD;  Location: [facility] CATH LAB;  Service: Cardiovascular;  Laterality: N/A;   CARDIAC SURGERY     HIP ARTHROPLASTY Right 11/10/2016   Procedure: ARTHROPLASTY BIPOLAR HIP (HEMIARTHROPLASTY);  Surgeon: Marchia Bond, MD;  Location: [facility];  Service: Orthopedics;  Laterality: Right;   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  07/31/2008   LITHOTRIPSY      Allergies Lisinopril, Spironolactone, Vancomycin, Donepezil, Eggs or egg-derived products, Erythromycin, Macrolides and ketolides, Penicillins, Quinapril hcl, Angiotensin receptor blockers, Lipitor [atorvastatin], Pentazocine lactate, Propoxyphene hcl, and Sulfonamide derivatives  Family History  Problem Relation Age of Onset   Diabetes Father    Heart disease Father    Heart attack Father    Stroke Mother    Breast cancer Maternal Grandmother    Diabetes Sister    Hypertension Sister    Colon cancer Neg Hx     Social History Social History   Tobacco Use   Smoking status: Former Smoker    Packs/day: 1.00    Years: 25.00    Pack years: 25.00    Types: Cigarettes   Smokeless tobacco: Never Used   Tobacco comment: over a ppd for 40+ years; quit 10/2004.  unsuccessfully tried eCigs.  Substance Use Topics   Alcohol use: No    Alcohol/week: 1.0 standard drinks    Types: 1 Standard drinks or equivalent per week   Drug use: No    Review of Systems  Level 5 caveat: Dementia and acute delirium.   ____________________________________________   PHYSICAL EXAM:  VITAL SIGNS: ED Triage Vitals  Enc Vitals Group     BP 10/13/19 1914 99/76     Pulse Rate 10/13/19  1914 (!) 101     Resp 10/13/19 1914 18     Temp 10/13/19 1914 98.1 F (36.7 C)     Temp Source 10/13/19 1914 Rectal     SpO2 10/13/19 1910 98 %   Constitutional: Alert but not verbally responsive.  Patient is not following commands but appears to be in no acute distress.  She is picking at things in the air as if in a delirious state.  Eyes: Conjunctivae are normal. PERRL. Head: Atraumatic. Nose: No congestion/rhinnorhea. Mouth/Throat: Mucous membranes are dry.  Neck: No stridor.   Cardiovascular: Tachycardia. Good peripheral circulation. Grossly normal heart sounds.   Respiratory: Normal respiratory effort.  No retractions. Lungs CTAB. Gastrointestinal: Soft with grimacing to diffuse palpation of the abdomen. No guarding. No distention.  Musculoskeletal: No gross deformities of extremities. Neurologic: Alert but not verbally responsive. Not making eye contact but no gaze preference. Moving bilateral upper and lower extremities equally but not following verbal commands which limits exams.  Skin:  Skin is warm, dry and intact. No rash noted.  ____________________________________________   LABS (all labs ordered are listed, but only abnormal results are displayed)  Labs Reviewed  LACTIC ACID, PLASMA - Abnormal; Notable for  the following components:      Result Value   Lactic Acid, Venous 6.7 (*)    All other components within normal limits  CBC WITH DIFFERENTIAL/PLATELET - Abnormal; Notable for the following components:   WBC 25.5 (*)    Hemoglobin 15.3 (*)    HCT 48.1 (*)    MCV 101.5 (*)    Neutro Abs 23.2 (*)    All other components within normal limits  APTT - Abnormal; Notable for the following components:   aPTT 23 (*)    All other components within normal limits  COMPREHENSIVE METABOLIC PANEL - Abnormal; Notable for the following components:   CO2 10 (*)    Glucose, Bld 189 (*)    BUN 106 (*)    Creatinine, Ser 7.23 (*)    Total Protein 9.1 (*)    GFR calc non Af  Amer 5 (*)    GFR calc Af Amer 6 (*)    Anion gap 27 (*)    All other components within normal limits  CULTURE, BLOOD (ROUTINE X 2)  CULTURE, BLOOD (ROUTINE X 2)  URINE CULTURE  PROTIME-INR  LIPASE, BLOOD  LACTIC ACID, PLASMA  URINALYSIS, ROUTINE W REFLEX MICROSCOPIC  POC SARS CORONAVIRUS 2 AG -  ED  TYPE AND SCREEN   ____________________________________________  EKG  Sinus tachycardia. Narrow QRS. No ST elevation or depression. No STEMI.  ____________________________________________  RADIOLOGY  Ct Abdomen Pelvis Wo Contrast  Result Date: 10/13/2019 CLINICAL DATA:  Cough with lethargy, recent cough ground emesis EXAM: CT CHEST, ABDOMEN AND PELVIS WITHOUT CONTRAST TECHNIQUE: Multidetector CT imaging of the chest, abdomen and pelvis was performed following the standard protocol without IV contrast. COMPARISON:  12/31/2014, 05/06/2015 FINDINGS: CT CHEST FINDINGS Cardiovascular: Somewhat limited due to lack of IV contrast. Diffuse aortic calcifications are noted. No aneurysmal dilatation is seen. No cardiac enlargement is noted. Heavy coronary calcifications are noted. Mediastinum/Nodes: The thoracic inlet is within normal limits. No hilar or mediastinal adenopathy is noted. The esophagus as visualized is within normal limits. Lungs/Pleura: Scattered tiny parenchymal nodules are noted bilaterally. These are best visualized on images 53, 92, 52 and 32 of series 5. Some of these were seen on the prior exam. They all measure less than 5 mm. No focal infiltrate or sizable effusion is seen. Mild scarring in the lingula is again seen. Musculoskeletal: Degenerative changes of the thoracic spine are seen. CT ABDOMEN PELVIS FINDINGS Hepatobiliary: No focal liver abnormality is seen. No gallstones, gallbladder wall thickening, or biliary dilatation. Pancreas: Unremarkable. No pancreatic ductal dilatation or surrounding inflammatory changes. Spleen: Normal in size without focal abnormality.  Adrenals/Urinary Tract: Adrenal glands are within normal limits. The kidneys are well visualized bilaterally. No renal calculi or obstructive changes are seen. Previously seen left renal calculus is not well appreciated on today's exam. The bladder is decompressed. Stomach/Bowel: Changes consistent with prior sigmoid surgery are noted with patent anastomosis. The colon is otherwise unremarkable. The appendix is within normal limits. Vascular/Lymphatic: Aortic atherosclerosis. No enlarged abdominal or pelvic lymph nodes. Reproductive: Uterus and bilateral adnexa are unremarkable. Other: No abdominal wall hernia or abnormality. No abdominopelvic ascites. Musculoskeletal: Right hip replacement is noted. Degenerative changes of the lumbar spine are seen. L1 and L5 compression deformities are seen which appear chronic in nature. The L1 fracture is new from the prior exam. IMPRESSION: CT of the chest: Scattered small parenchymal nodules some of which are stable from the previous exam from 4 years previous. They all measure less than 5  mm. No follow-up needed if patient is low-risk (and has no known or suspected primary neoplasm). Non-contrast chest CT can be considered in 12 months if patient is high-risk. This recommendation follows the consensus statement: Guidelines for Management of Incidental Pulmonary Nodules Detected on CT Images: From the Fleischner Society 2017; Radiology 2017; 284:228-243. CT of the abdomen and pelvis: Previously seen left renal stone is not well appreciated on today's exam. Chronic changes as described without acute abnormality. Aortic Atherosclerosis (ICD10-I70.0). Electronically Signed   By: Inez Catalina M.D.   On: 10/13/2019 20:39   Ct Head Wo Contrast  Result Date: 10/13/2019 CLINICAL DATA:  Encephalopathy EXAM: CT HEAD WITHOUT CONTRAST TECHNIQUE: Contiguous axial images were obtained from the base of the skull through the vertex without intravenous contrast. COMPARISON:  None.  FINDINGS: Brain: There is no mass, hemorrhage or extra-axial collection. There is generalized atrophy without lobar predilection. Hypodensity of the white matter is most commonly associated with chronic microvascular disease. Vascular: No abnormal hyperdensity of the major intracranial arteries or dural venous sinuses. No intracranial atherosclerosis. Skull: The visualized skull base, calvarium and extracranial soft tissues are normal. Sinuses/Orbits: No fluid levels or advanced mucosal thickening of the visualized paranasal sinuses. No mastoid or middle ear effusion. The orbits are normal. IMPRESSION: Generalized atrophy and chronic microvascular ischemia without acute intracranial abnormality. Electronically Signed   By: Ulyses Jarred M.D.   On: 10/13/2019 20:34   Ct Chest Wo Contrast  Result Date: 10/13/2019 CLINICAL DATA:  Cough with lethargy, recent cough ground emesis EXAM: CT CHEST, ABDOMEN AND PELVIS WITHOUT CONTRAST TECHNIQUE: Multidetector CT imaging of the chest, abdomen and pelvis was performed following the standard protocol without IV contrast. COMPARISON:  12/31/2014, 05/06/2015 FINDINGS: CT CHEST FINDINGS Cardiovascular: Somewhat limited due to lack of IV contrast. Diffuse aortic calcifications are noted. No aneurysmal dilatation is seen. No cardiac enlargement is noted. Heavy coronary calcifications are noted. Mediastinum/Nodes: The thoracic inlet is within normal limits. No hilar or mediastinal adenopathy is noted. The esophagus as visualized is within normal limits. Lungs/Pleura: Scattered tiny parenchymal nodules are noted bilaterally. These are best visualized on images 53, 92, 52 and 32 of series 5. Some of these were seen on the prior exam. They all measure less than 5 mm. No focal infiltrate or sizable effusion is seen. Mild scarring in the lingula is again seen. Musculoskeletal: Degenerative changes of the thoracic spine are seen. CT ABDOMEN PELVIS FINDINGS Hepatobiliary: No focal liver  abnormality is seen. No gallstones, gallbladder wall thickening, or biliary dilatation. Pancreas: Unremarkable. No pancreatic ductal dilatation or surrounding inflammatory changes. Spleen: Normal in size without focal abnormality. Adrenals/Urinary Tract: Adrenal glands are within normal limits. The kidneys are well visualized bilaterally. No renal calculi or obstructive changes are seen. Previously seen left renal calculus is not well appreciated on today's exam. The bladder is decompressed. Stomach/Bowel: Changes consistent with prior sigmoid surgery are noted with patent anastomosis. The colon is otherwise unremarkable. The appendix is within normal limits. Vascular/Lymphatic: Aortic atherosclerosis. No enlarged abdominal or pelvic lymph nodes. Reproductive: Uterus and bilateral adnexa are unremarkable. Other: No abdominal wall hernia or abnormality. No abdominopelvic ascites. Musculoskeletal: Right hip replacement is noted. Degenerative changes of the lumbar spine are seen. L1 and L5 compression deformities are seen which appear chronic in nature. The L1 fracture is new from the prior exam. IMPRESSION: CT of the chest: Scattered small parenchymal nodules some of which are stable from the previous exam from 4 years previous. They all measure less than  5 mm. No follow-up needed if patient is low-risk (and has no known or suspected primary neoplasm). Non-contrast chest CT can be considered in 12 months if patient is high-risk. This recommendation follows the consensus statement: Guidelines for Management of Incidental Pulmonary Nodules Detected on CT Images: From the Fleischner Society 2017; Radiology 2017; 284:228-243. CT of the abdomen and pelvis: Previously seen left renal stone is not well appreciated on today's exam. Chronic changes as described without acute abnormality. Aortic Atherosclerosis (ICD10-I70.0). Electronically Signed   By: Inez Catalina M.D.   On: 10/13/2019 20:39   Dg Chest Port 1 View  Result  Date: 10/13/2019 CLINICAL DATA:  Chest pain and shortness of breath EXAM: PORTABLE CHEST 1 VIEW COMPARISON:  11/10/2016 FINDINGS: Cardiac shadow is stable. Aortic calcifications are again seen. The lungs are well aerated bilaterally with the exception of stable left basilar scarring and effusion no new focal infiltrate is seen. No acute bony abnormality is noted. IMPRESSION: Chronic scarring in the left base.  No acute abnormality seen. Electronically Signed   By: Inez Catalina M.D.   On: 10/13/2019 19:58    ____________________________________________   PROCEDURES  Procedure(s) performed:   Procedures  CRITICAL CARE Performed by: Margette Fast Total critical care time: 35 minutes Critical care time was exclusive of separately billable procedures and treating other patients. Critical care was necessary to treat or prevent imminent or life-threatening deterioration. Critical care was time spent personally by me on the following activities: development of treatment plan with patient and/or surrogate as well as nursing, discussions with consultants, evaluation of patient's response to treatment, examination of patient, obtaining history from patient or surrogate, ordering and performing treatments and interventions, ordering and review of laboratory studies, ordering and review of radiographic studies, pulse oximetry and re-evaluation of patient's condition.  Nanda Quinton, MD Emergency Medicine  ____________________________________________   INITIAL IMPRESSION / ASSESSMENT AND PLAN / ED COURSE  Pertinent labs & imaging results that were available during my care of the patient were reviewed by me and considered in my medical decision making (see chart for details).   Patient presents to the emergency department for evaluation of altered mental status.  Patient with vomiting and diarrhea through the weekend and apparently has significantly elevated BUN/creatinine.  Mild apparent tenderness on  abdominal exam although this is limited by the patient's dementia and inability to describe symptoms.  Report of outpatient chest x-ray shows infiltrate in the left lower lobe which is read as similar to prior study in 2019.  Patient is not hypoxic or febrile here.  My suspicion for sepsis at this time is low.  We will hold on antibiotics.  Plan for gentle IV fluid hydration while awaiting scans and repeat labs here.   10:18 PM  Patient's labs delayed with difficult IV access but CMP now resulted showing creatinine of 7.23 with elevated BUN.  Normal potassium.  At this point treating for sepsis of unknown source but patient's sacral area is diffusely erythematous as a possible source.  UA is pending.  No hypotension or tachycardia.  I discussed the patient's case with her daughter by phone.  She confirms at the patient is DNR but would be okay with antibiotics and IV fluids.  CT imaging of the head, chest, abdomen performed without contrast and are unremarkable. 0 mL on bladder scan.   Discussed patient's case with [facility], Dr. Marlowe Sax to request admission. Patient and family (if present) updated with plan. Care transferred to [facility] service.  I reviewed  all nursing notes, vitals, pertinent old records, EKGs, labs, imaging (as available).  Spoke with Dr. Joelyn Oms with nephrology. Recommends IVF and Bicarb. Primary team can call in the AM if labs aren't improving.   ____________________________________________  FINAL CLINICAL IMPRESSION(S) / ED DIAGNOSES  Final diagnoses:  AKI (acute kidney injury) ([place])  Sepsis with acute renal failure without septic shock, due to unspecified organism, unspecified acute renal failure type ([place])    MEDICATIONS GIVEN DURING THIS VISIT:  Medications  metroNIDAZOLE (FLAGYL) IVPB 500 mg (500 mg Intravenous New Bag/Given 10/13/19 2244)  lactated ringers bolus 1,000 mL (1,000 mLs Intravenous New Bag/Given 10/13/19 2201)    And  lactated ringers bolus 1,000 mL (has no  administration in time range)  cefTRIAXone (ROCEPHIN) 2 g in sodium chloride 0.9 % 100 mL IVPB (has no administration in time range)  sodium chloride 0.9 % bolus 500 mL (0 mLs Intravenous Stopped 10/13/19 2224)     Note:  This document was prepared using Dragon voice recognition software and may include unintentional dictation errors.  Nanda Quinton, MD, [facility]    Tish Begin, [facility], MD 10/13/19 2257    Margette Fast, MD 10/13/19 [phone]

## 2019-10-13 NOTE — ED Notes (Signed)
Pt transported to CT ?

## 2019-10-14 ENCOUNTER — Inpatient Hospital Stay (HOSPITAL_COMMUNITY): Payer: Medicare Other

## 2019-10-14 DIAGNOSIS — E872 Acidosis, unspecified: Secondary | ICD-10-CM

## 2019-10-14 DIAGNOSIS — A419 Sepsis, unspecified organism: Secondary | ICD-10-CM

## 2019-10-14 DIAGNOSIS — N179 Acute kidney failure, unspecified: Secondary | ICD-10-CM | POA: Diagnosis present

## 2019-10-14 DIAGNOSIS — A084 Viral intestinal infection, unspecified: Secondary | ICD-10-CM

## 2019-10-14 DIAGNOSIS — R6520 Severe sepsis without septic shock: Secondary | ICD-10-CM

## 2019-10-14 DIAGNOSIS — G9341 Metabolic encephalopathy: Secondary | ICD-10-CM

## 2019-10-14 LAB — C DIFFICILE QUICK SCREEN W PCR REFLEX
C Diff antigen: NEGATIVE
C Diff interpretation: NOT DETECTED
C Diff toxin: NEGATIVE

## 2019-10-14 LAB — RENAL FUNCTION PANEL
Albumin: 2.6 g/dL — ABNORMAL LOW (ref 3.5–5.0)
Anion gap: 25 — ABNORMAL HIGH (ref 5–15)
BUN: 104 mg/dL — ABNORMAL HIGH (ref 8–23)
CO2: 15 mmol/L — ABNORMAL LOW (ref 22–32)
Calcium: 8.5 mg/dL — ABNORMAL LOW (ref 8.9–10.3)
Chloride: 106 mmol/L (ref 98–111)
Creatinine, Ser: 6.57 mg/dL — ABNORMAL HIGH (ref 0.44–1.00)
GFR calc Af Amer: 6 mL/min — ABNORMAL LOW (ref >60)
GFR calc non Af Amer: 6 mL/min — ABNORMAL LOW (ref >60)
Glucose, Bld: 128 mg/dL — ABNORMAL HIGH (ref 70–99)
Phosphorus: 7.5 mg/dL — ABNORMAL HIGH (ref 2.5–4.6)
Potassium: 4.4 mmol/L (ref 3.5–5.1)
Sodium: 146 mmol/L — ABNORMAL HIGH (ref 135–145)

## 2019-10-14 LAB — SARS CORONAVIRUS 2 (TAT 6-24 HRS): SARS Coronavirus 2: NEGATIVE

## 2019-10-14 LAB — CBC
HCT: 36.8 % (ref 36.0–46.0)
Hemoglobin: 11.8 g/dL — ABNORMAL LOW (ref 12.0–15.0)
MCH: 31.7 pg (ref 26.0–34.0)
MCHC: 32.1 g/dL (ref 30.0–36.0)
MCV: 98.9 fL (ref 80.0–100.0)
Platelets: 212 10*3/uL (ref 150–400)
RBC: 3.72 MIL/uL — ABNORMAL LOW (ref 3.87–5.11)
RDW: 13.2 % (ref 11.5–15.5)
WBC: 21.9 10*3/uL — ABNORMAL HIGH (ref 4.0–10.5)
nRBC: 0 % (ref 0.0–0.2)

## 2019-10-14 LAB — LACTIC ACID, PLASMA
Lactic Acid, Venous: 5.3 mmol/L (ref 0.5–1.9)
Lactic Acid, Venous: 4.3 mmol/L (ref 0.5–1.9)

## 2019-10-14 LAB — HEMOGLOBIN A1C
Hgb A1c MFr Bld: 6.7 % — ABNORMAL HIGH (ref 4.8–5.6)
Mean Plasma Glucose: 145.59 mg/dL

## 2019-10-14 LAB — MAGNESIUM: Magnesium: 1.7 mg/dL (ref 1.7–2.4)

## 2019-10-14 LAB — PROCALCITONIN: Procalcitonin: 1.99 ng/mL

## 2019-10-14 LAB — CBG MONITORING, ED: Glucose-Capillary: 130 mg/dL — ABNORMAL HIGH (ref 70–99)

## 2019-10-14 LAB — GLUCOSE, CAPILLARY
Glucose-Capillary: 95 mg/dL (ref 70–99)
Glucose-Capillary: 56 mg/dL — ABNORMAL LOW (ref 70–99)
Glucose-Capillary: 83 mg/dL (ref 70–99)
Glucose-Capillary: 106 mg/dL — ABNORMAL HIGH (ref 70–99)
Glucose-Capillary: 64 mg/dL — ABNORMAL LOW (ref 70–99)

## 2019-10-14 MED ORDER — DEXTROSE 50 % IV SOLN
INTRAVENOUS | Status: AC
  Administered 2019-10-14: 25 mL
  Filled 2019-10-14: qty 50

## 2019-10-14 MED ORDER — PROMETHAZINE HCL 25 MG/ML IJ SOLN: 12.5000 mg | Freq: Four times a day (QID) | INTRAMUSCULAR | Status: DC | PRN

## 2019-10-14 MED ORDER — SODIUM CHLORIDE 0.9 % IV BOLUS
500.0000 mL | Freq: Once | INTRAVENOUS | Status: AC
  Administered 2019-10-14: 500 mL via INTRAVENOUS

## 2019-10-14 MED ORDER — GLUCAGON HCL RDNA (DIAGNOSTIC) 1 MG IJ SOLR
INTRAMUSCULAR | Status: AC
  Administered 2019-10-14: 1 mg
  Filled 2019-10-14: qty 1

## 2019-10-14 MED ORDER — INSULIN ASPART 100 UNIT/ML ~~LOC~~ SOLN
0.0000 [IU] | Freq: Every day | SUBCUTANEOUS | Status: DC
  Administered 2019-10-15 – 2019-10-17 (×2): 2 [IU] via SUBCUTANEOUS

## 2019-10-14 MED ORDER — ENSURE ENLIVE PO LIQD
237.0000 mL | Freq: Two times a day (BID) | ORAL | Status: DC
  Administered 2019-10-14 – 2019-10-21 (×7): 237 mL via ORAL

## 2019-10-14 MED ORDER — PANTOPRAZOLE SODIUM 40 MG IV SOLR
40.0000 mg | Freq: Two times a day (BID) | INTRAVENOUS | Status: DC
  Administered 2019-10-14 – 2019-10-18 (×10): 40 mg via INTRAVENOUS
  Filled 2019-10-14 (×9): qty 40

## 2019-10-14 MED ORDER — INSULIN ASPART 100 UNIT/ML ~~LOC~~ SOLN
0.0000 [IU] | Freq: Three times a day (TID) | SUBCUTANEOUS | Status: DC
  Administered 2019-10-15: 1 [IU] via SUBCUTANEOUS
  Administered 2019-10-16: 3 [IU] via SUBCUTANEOUS
  Administered 2019-10-16: 2 [IU] via SUBCUTANEOUS
  Administered 2019-10-17 – 2019-10-18 (×4): 1 [IU] via SUBCUTANEOUS
  Administered 2019-10-18: 06:00:00 2 [IU] via SUBCUTANEOUS
  Administered 2019-10-18 – 2019-10-20 (×3): 1 [IU] via SUBCUTANEOUS

## 2019-10-14 NOTE — Progress Notes (Addendum)
Initial Nutrition Assessment  DOCUMENTATION CODES:   Not applicable  INTERVENTION:  Ensure Enlive BID (each supplement provides 350 calories and 20 grams of protein)  Magic cup with meals (each supplement provides 250 calories and 9 grams of protein)  Feeding assistance  MVI daily  NUTRITION DIAGNOSIS:   Inadequate oral intake related to lethargy/confusion as evidenced by meal completion < 25%.  GOAL:   Patient will meet greater than or equal to 90% of their needs  MONITOR:   PO intake, Supplement acceptance, Labs, Weight trends, Skin, I & O's  REASON FOR ASSESSMENT:   Consult Assessment of nutrition requirement/status  ASSESSMENT:   78 y.o. female with medical history significant of Alzheimer's disease, CAD CHF, GERD, COPD, insulin-dependent diabetes mellitus, HTN, intermittent hyponatremia. Admitted for acute renal failure and metabolic acidosis.  Case was discussed with RN, pt was asleep and unable to rouse at time of assessment. RN reported that pt only ate one bite of mashed potatoes and drank about half of her milk carton from her lunch tray. Pt is not room service appropriate, feeding assistance also required. Weights are stable. Upon nutrition focused physical exam, noted moderate muscle wasting in lower extremities. Labs and medications reviewed.  NUTRITION - FOCUSED PHYSICAL EXAM:    Most Recent Value  Orbital Region  Mild depletion  Upper Arm Region  Mild depletion  Thoracic and Lumbar Region  Mild depletion  Buccal Region  No depletion  Temple Region  Mild depletion  Clavicle Bone Region  No depletion  Clavicle and Acromion Bone Region  No depletion  Scapular Bone Region  No depletion  Dorsal Hand  Mild depletion  Patellar Region  Moderate depletion  Anterior Thigh Region  Moderate depletion  Posterior Calf Region  Moderate depletion  Edema (RD Assessment)  None  Hair  Reviewed  Eyes  Unable to assess [pt sleeping, unable to rouse]  Mouth  Unable  to assess  Skin  Reviewed  Nails  Reviewed       Diet Order:   Diet Order            Diet Carb Modified Fluid consistency: Thin; Room service appropriate? Yes  Diet effective now              EDUCATION NEEDS:   Not appropriate for education at this time  Skin:  Skin Assessment: Reviewed RN Assessment  Last BM:  11/24  Height:   Ht Readings from Last 1 Encounters:  10/14/19 5\' 4"  (1.626 m)    Weight:   Wt Readings from Last 1 Encounters:  10/14/19 57.5 kg    Ideal Body Weight:  54 kg  BMI:  Body mass index is 21.77 kg/m.  Estimated Nutritional Needs:   Kcal:  1600-1800  Protein:  75-85  Fluid:  >/= 1.5 L  Meda Klinefelter, Dietetic Intern  ___________________________________________  RD has read, reviewed, and agrees with dietetic intern's note. Appropriate revisions have been made.   Corrin Parker, MS, RD, LDN Pager # [phone] After hours/ weekend pager # [phone]

## 2019-10-14 NOTE — ED Notes (Signed)
This RN notified admitting MD [place] of inability to obtain new peripheral IV access nor ability to draw blood specimens. Instructed to ask phlebotomy to attempt for blood specimen draws.

## 2019-10-14 NOTE — TOC Initial Note (Signed)
Transition of Care [facility]) - Initial/Assessment Note    Patient Details  Name: [name] MRN: [id number] Date of Birth: Dec 17, 1940  Transition of Care [facility]) CM/SW Contact:    Kirstie Peri, [place] Work Phone Number: 10/14/2019, 11:34 AM  Clinical Narrative:                 MSW Intern spoke with [name] at [facility] rehab to confirm that the PT can return there when ready to discharge. [name] said to let her know if she is coming back as rehab or long term stay.   MSW Intern called Pt daughter, Benjamine Mola, to confirm that she would like for the PT to return to [facility] when ready to discharge. Daughter told SW that her mother had been at [facility] since 2018 and feels that they take good care of her mother. MSW Intern confirmed that the plan is for pt to return for long term care.   Daughter stated that the doctor called her yesterday, but she has not heard back from him. She also said that she felt like the PT's prognosis was not good, because the doctor had asked if the DNR was still in place. She asked MSW Intern if there was any news on the patients progress. MSW Intern told daughter that patient was undergoing some tests, and that the doctor would call her with any new information. SW will continue to follow.   Expected Discharge Plan: Skilled Nursing Facility Barriers to Discharge: Continued Medical Work up, Ship broker   Patient Goals and CMS Choice Patient states their goals for this hospitalization and ongoing recovery are:: Patient unable to participate in goal planning due to confusion. CMS Medicare.gov Compare Post Acute Care list provided to:: Patient Represenative (must comment) Choice offered to / list presented to : Adult Children  Expected Discharge Plan and Services Expected Discharge Plan: [employer] Choice: [place] arrangements for the past 2 months: [place]                                   Prior Living Arrangements/Services Living arrangements for the past 2 months: [place] Lives with:: Facility Resident Patient language and need for interpreter reviewed:: Yes Do you feel safe going back to the place where you live?: Yes      Need for Family Participation in Patient Care: Yes (Comment) Care giver support system in place?: Yes (comment)   Criminal Activity/Legal Involvement Pertinent to Current Situation/Hospitalization: No - Comment as needed  Activities of Daily Living      Permission Sought/Granted Permission sought to share information with : Facility Art therapist granted to share information with : Yes, Verbal Permission Granted  Share Information with NAME: [name]  Permission granted to share info w AGENCY: [place] granted to share info w Relationship: Daughter  Permission granted to share info w Contact Information: [phone]  Emotional Assessment Appearance:: Other (Comment Required(U/A) Attitude/Demeanor/Rapport: Unable to Assess Affect (typically observed): Unable to Assess Orientation: : (Disoriented x4) Alcohol / Substance Use: Not Applicable Psych Involvement: No (comment)  Admission diagnosis:  AKI (acute kidney injury) ([place]) [N17.9] Sepsis with acute renal failure without septic shock, due to unspecified organism, unspecified acute renal failure type ([place]) [A41.9, R65.20, N17.9] Patient Active Problem List   Diagnosis Date Noted  . Acute renal failure (  ARF) ([place]) 10/14/2019  . Metabolic acidosis Q000111Q  . Acute metabolic encephalopathy Q000111Q  . Viral gastroenteritis 10/14/2019  . AKI (acute kidney injury) ([place]) 10/14/2019  . Hypertension associated with type 2 diabetes mellitus ([place]) 03/06/2019  . Dyslipidemia associated with type 2 diabetes mellitus ([place]) 03/06/2019  . Acquired hypothyroidism 03/06/2019  . Vascular dementia  without behavioral disturbance ([place]) 03/06/2019  . Insomnia 08/06/2018  . GERD (gastroesophageal reflux disease) 08/04/2017  . Dysphagia 05/10/2017  . Depression 03/14/2017  . Vitamin D deficiency 01/28/2017  . UTI due to extended-spectrum beta lactamase (ESBL) producing Escherichia coli 11/18/2016  . Urinary tract infection due to Proteus 11/18/2016  . Postoperative anemia due to acute blood loss 11/18/2016  . HTN (hypertension) 11/18/2016  . Closed right hip fracture ([place]) 11/10/2016  . Lactic acidosis 01/02/2016  . Senile dementia without behavioral disturbance ([place]) 10/06/2015  . Obesity (BMI 30-39.9) 10/06/2015  . Tobacco use disorder 03/17/2015  . Cardiomyopathy, ischemic 03/17/2015  . Allergy to ACE inhibitors 03/17/2015  . Ventricular tachycardia ([place]) 03/17/2015  . HLD (hyperlipidemia) 12/31/2014  . COPD (chronic obstructive pulmonary disease) ([place]) 12/31/2014  . Chronic cough 09/12/2013  . Memory deficit 04/23/2013  . Essential hypertension, benign 04/03/2013  . Dyslipidemia 04/03/2013  . Coronary atherosclerosis of native coronary artery 04/03/2013  . DM2 (diabetes mellitus, type 2) ([place]) 04/02/2013  . Fibromyalgia 04/20/2008   PCP:  Hennie Duos, MD Pharmacy:  No Pharmacies Listed    Social Determinants of Health (SDOH) Interventions    Readmission Risk Interventions No flowsheet data found.

## 2019-10-14 NOTE — Progress Notes (Signed)
Patient CBG=56  Patient refused to drink 4oz of soda  Dextrose 50% 34ml given CBG rechecked 58mins later, CBG=83

## 2019-10-14 NOTE — ED Notes (Signed)
This RN notified admitting MD Rathore of inability to draw blood specimens by phlebotomy and inability to obtain peripheral IV access by this RN. MD stated she would place medication orders and instructed to attempt for blood specimen draws after bladder scan.

## 2019-10-14 NOTE — Plan of Care (Signed)
Educated patient on reason for hospital admission, patient unable to verbalize understanding the information given.

## 2019-10-14 NOTE — Progress Notes (Signed)
PROGRESS NOTE    [name]  [id number] DOB: [birth year] DOA: 10/13/2019 PCP: Hennie Duos, MD     Brief Narrative:  78 y.o. female with medical history significant of Alzheimer's disease, CAD status post PCI, chronic diastolic congestive heart failure, COPD, insulin-dependent diabetes mellitus, hypertension, hyperlipidemia, and conditions listed below presenting to the ED via EMS for evaluation of lethargy and altered mental status.  Facility reported that patient had vomiting and diarrhea that started on Friday.  Chest x-ray and labs were done.  This morning patient was found to be more lethargic/less responsive than normal.  Patient has Alzheimer's dementia and is disoriented at baseline.  Patient had a single episode of coffee-ground emesis 3 days ago which has not recurred.  ED provider spoke to the patient's daughter who confirmed that patient is DNR.  No history could be obtained from the patient due to her advanced dementia.  Awake but not alert.  ED Course: Afebrile.  WBC count 25.5.  Lactic acid 6.7.  UA and urine culture pending.  BUN 106, creatinine 7.2.  Baseline creatinine 0.8.  Bicarb 10, potassium 5.1.  Anion gap 27.  Lipase and LFTs normal.  Rapid SARS-CoV-2 test negative, repeat 6-24-hour test pending.  INR 1.2.  Blood culture x2 pending. Chest x-ray not suggestive of pneumonia. Head CT negative for acute intracranial abnormality. CT abdomen pelvis negative for acute finding. CT chest without pneumonia or pulmonary edema   Assessment & Plan: 1-Acute renal failure (ARF) (HCC) -Appears to be prerenal in nature secondary to severe dehydration and continue use of diuretics. -Continue holding nephrotoxic agents -Continue aggressive fluid resuscitation -Follow-up renal function trend -Urinalysis no reflecting active UTI. -Renal ultrasound demonstrating no acute hydronephrosis or obstructive uropathy.  2-Lactic acidosis -In the setting of severe  dehydration -Continue current fluid resuscitation -Follow lactic acid level.  3-acute metabolic encephalopathy -Secondary to uremia and electrolyte imbalances -Patient with underlying dementia; unknown baseline -Continue IV fluids and supportive care. -Mood is a stable at this time.  4-dementia without behavioral disorder -Continue Namenda -Continue supportive care.  5-depression and poor oral intake -Continue Remeron.  6-Viral gastroenteritis -Negative C. difficile -Will add Florastor and continue fluid resuscitation.  7-SIRS/SIRS -due to viral gastroenteritis  -COVID neg -will stop antibiotics.. -continue supportive care.  DVT prophylaxis: SCDs Code Status: DNR Family Communication: No family at bedside. Disposition Plan: Remains inpatient, continue bicarb drip, close follow-up of renal function and electrolytes.  C. difficile negative.  Consultants:   Nephrology curbside (at this moment no need for formal consultation; continue bicarb drip, proper hydration and close monitoring of renal function).  Discussed with Dr. Royce Macadamia.  Procedures:   See below for x-ray reports.  Antimicrobials:  Anti-infectives (From admission, onward)   Start     Dose/Rate Route Frequency Ordered Stop   10/14/19 0030  meropenem (MERREM) 500 mg in sodium chloride 0.9 % 100 mL IVPB     500 mg 200 mL/hr over 30 Minutes Intravenous Daily at bedtime 10/13/19 2354     10/13/19 2300  cefTRIAXone (ROCEPHIN) 2 g in sodium chloride 0.9 % 100 mL IVPB  Status:  Discontinued     2 g 200 mL/hr over 30 Minutes Intravenous Every 24 hours 10/13/19 2238 10/13/19 2344   10/13/19 2130  aztreonam (AZACTAM) 2 g in sodium chloride 0.9 % 100 mL IVPB  Status:  Discontinued     2 g 200 mL/hr over 30 Minutes Intravenous  Once 10/13/19 2116 10/13/19 2137   10/13/19 2130  metroNIDAZOLE (  FLAGYL) IVPB 500 mg     500 mg 100 mL/hr over 60 Minutes Intravenous  Once 10/13/19 2116 10/14/19 0023       Subjective: Intermittently lethargic; no nausea, no vomiting, no chest pain.  Good oxygen saturation on room air.  Still with poor intake.  Objective: Vitals:   10/14/19 0452 10/14/19 1007 10/14/19 1105 10/14/19 1604  BP: (!) 123/44 (!) 116/98 (!) 108/52 (!) 140/96  Pulse: 91 90 84 83  Resp: 18 17 20 13   Temp: 97.9 F (36.6 C) (!) 97.5 F (36.4 C) (!) 97.5 F (36.4 C) 97.8 F (36.6 C)  TempSrc: Oral Axillary Axillary Axillary  SpO2:  96% 91% 100%  Weight:      Height:        Intake/Output Summary (Last 24 hours) at 10/14/2019 1741 Last data filed at 10/14/2019 1600 Gross per 24 hour  Intake 3934.12 ml  Output --  Net 3934.12 ml   Filed Weights   10/14/19 0335  Weight: 57.5 kg    Examination: General exam: Alert, awake, oriented x 1; able to follow simple commands, not talking much and intermittently lethargic on exam.  No nausea, no vomiting, no chest pain. Respiratory system: Clear to auscultation.  No using accessory muscles; respiratory effort is normal.  Good O2 sat on room air. Cardiovascular system:RRR. No rubs, no gallops; no JVD. Gastrointestinal system: Abdomen is nondistended, soft and nontender. No organomegaly or masses felt. Normal bowel sounds heard. Central nervous system: Alert and oriented. No focal neurological deficits. Extremities: No cyanosis or clubbing.  No edema appreciated on exam. Skin: No petechiae; no open wounds. Psychiatry: Poor insight secondary to underlying dementia and acute metabolic encephalopathy.  Mood & affect appropriate.     Data Reviewed: I have personally reviewed following labs and imaging studies  CBC: Recent Labs  Lab 10/13/19 2010 10/14/19 0528  WBC 25.5* 21.9*  NEUTROABS 23.2*  --   HGB 15.3* 11.8*  HCT 48.1* 36.8  MCV 101.5* 98.9  PLT 230 [id number]   Basic Metabolic Panel: Recent Labs  Lab 10/13/19 2117 10/14/19 0528  NA 143 146*  K 5.1 4.4  CL 106 106  CO2 10* 15*  GLUCOSE 189* 128*  BUN 106* 104*   CREATININE 7.23* 6.57*  CALCIUM 10.1 8.5*  MG  --  1.7  PHOS  --  7.5*   GFR: Estimated Creatinine Clearance: 6.2 mL/min (A) (by C-G formula based on SCr of 6.57 mg/dL (H)).   Liver Function Tests: Recent Labs  Lab 10/13/19 2117 10/14/19 0528  AST 29  --   ALT 16  --   ALKPHOS 105  --   BILITOT 0.6  --   PROT 9.1*  --   ALBUMIN 3.5 2.6*   Recent Labs  Lab 10/13/19 2117  LIPASE 20   Coagulation Profile: Recent Labs  Lab 10/13/19 2159  INR 1.2   HbA1C: Recent Labs    10/14/19 0528  HGBA1C 6.7*   CBG: Recent Labs  Lab 10/14/19 0149 10/14/19 0622 10/14/19 1249 10/14/19 1341 10/14/19 1612  GLUCAP 130* 95 56* 83 106*   Urine analysis:    Component Value Date/Time   COLORURINE YELLOW 11/13/2016 1325   APPEARANCEUR CLEAR 11/13/2016 1325   LABSPEC 1.017 11/13/2016 1325   PHURINE 5.0 11/13/2016 1325   GLUCOSEU 50 (A) 11/13/2016 1325   HGBUR NEGATIVE 11/13/2016 1325   BILIRUBINUR NEGATIVE 11/13/2016 1325   KETONESUR NEGATIVE 11/13/2016 1325   PROTEINUR NEGATIVE 11/13/2016 1325   UROBILINOGEN 0.2 01/23/2014  2343   NITRITE NEGATIVE 11/13/2016 [place] 11/13/2016 1325    Recent Results (from the past 240 hour(s))  Blood Culture (routine x 2)     Status: None (Preliminary result)   Collection Time: 10/13/19 10:00 PM   Specimen: BLOOD RIGHT WRIST  Result Value Ref Range Status   Specimen Description BLOOD RIGHT WRIST  Final   Special Requests   Final    BOTTLES DRAWN AEROBIC AND ANAEROBIC Blood Culture results may not be optimal due to an inadequate volume of blood received in culture bottles   Culture   Final    NO GROWTH < 24 HOURS Performed at [facility], [place] [address] [place], [place] [address]    Report Status PENDING  Incomplete  Blood Culture (routine x 2)     Status: None (Preliminary result)   Collection Time: 10/13/19 10:00 PM   Specimen: BLOOD  Result Value Ref Range Status   Specimen Description BLOOD LEFT  ANKLE  Final   Special Requests   Final    BOTTLES DRAWN AEROBIC ONLY Blood Culture results may not be optimal due to an inadequate volume of blood received in culture bottles   Culture   Final    NO GROWTH < 24 HOURS Performed at [facility], [place] [address], [place], [place] [address]    Report Status PENDING  Incomplete  SARS CORONAVIRUS 2 (TAT 6-24 HRS) Nasopharyngeal Nasopharyngeal Swab     Status: None   Collection Time: 10/13/19 10:59 PM   Specimen: Nasopharyngeal Swab  Result Value Ref Range Status   SARS Coronavirus 2 NEGATIVE NEGATIVE Final    Comment: (NOTE) SARS-CoV-2 target nucleic acids are NOT DETECTED. The SARS-CoV-2 RNA is generally detectable in upper and lower respiratory specimens during the acute phase of infection. Negative results do not preclude SARS-CoV-2 infection, do not rule out co-infections with other pathogens, and should not be used as the sole basis for treatment or other patient management decisions. Negative results must be combined with clinical observations, patient history, and epidemiological information. The expected result is Negative. Fact Sheet for Patients: SugarRoll.be Fact Sheet for Healthcare Providers: https://www.woods-mathews.com/ This test is not yet approved or cleared by the [place] FDA and  has been authorized for detection and/or diagnosis of SARS-CoV-2 by FDA under an Emergency Use Authorization (EUA). This EUA will remain  in effect (meaning this test can be used) for the duration of the COVID-19 declaration under Section 56 4(b)(1) of the Act, 21 U.S.C. section 360bbb-3(b)(1), unless the authorization is terminated or revoked sooner. Performed at [facility], [place] [address], [place], [place] [address]   C difficile quick scan w PCR reflex     Status: None   Collection Time: 10/14/19  4:01 AM   Specimen: Stool  Result Value Ref Range Status   C Diff antigen  NEGATIVE NEGATIVE Final   C Diff toxin NEGATIVE NEGATIVE Final   C Diff interpretation No C. difficile detected.  Final    Comment: Performed at [facility], [place] [address], [place],  [address]     Radiology Studies: Ct Abdomen Pelvis Wo Contrast  Result Date: 10/13/2019 CLINICAL DATA:  Cough with lethargy, recent cough ground emesis EXAM: CT CHEST, ABDOMEN AND PELVIS WITHOUT CONTRAST TECHNIQUE: Multidetector CT imaging of the chest, abdomen and pelvis was performed following the standard protocol without IV contrast. COMPARISON:  12/31/2014, 05/06/2015 FINDINGS: CT CHEST FINDINGS Cardiovascular: Somewhat limited due to  lack of IV contrast. Diffuse aortic calcifications are noted. No aneurysmal dilatation is seen. No cardiac enlargement is noted. Heavy coronary calcifications are noted. Mediastinum/Nodes: The thoracic inlet is within normal limits. No hilar or mediastinal adenopathy is noted. The esophagus as visualized is within normal limits. Lungs/Pleura: Scattered tiny parenchymal nodules are noted bilaterally. These are best visualized on images 53, 92, 52 and 32 of series 5. Some of these were seen on the prior exam. They all measure less than 5 mm. No focal infiltrate or sizable effusion is seen. Mild scarring in the lingula is again seen. Musculoskeletal: Degenerative changes of the thoracic spine are seen. CT ABDOMEN PELVIS FINDINGS Hepatobiliary: No focal liver abnormality is seen. No gallstones, gallbladder wall thickening, or biliary dilatation. Pancreas: Unremarkable. No pancreatic ductal dilatation or surrounding inflammatory changes. Spleen: Normal in size without focal abnormality. Adrenals/Urinary Tract: Adrenal glands are within normal limits. The kidneys are well visualized bilaterally. No renal calculi or obstructive changes are seen. Previously seen left renal calculus is not well appreciated on today's exam. The bladder is decompressed. Stomach/Bowel: Changes consistent  with prior sigmoid surgery are noted with patent anastomosis. The colon is otherwise unremarkable. The appendix is within normal limits. Vascular/Lymphatic: Aortic atherosclerosis. No enlarged abdominal or pelvic lymph nodes. Reproductive: Uterus and bilateral adnexa are unremarkable. Other: No abdominal wall hernia or abnormality. No abdominopelvic ascites. Musculoskeletal: Right hip replacement is noted. Degenerative changes of the lumbar spine are seen. L1 and L5 compression deformities are seen which appear chronic in nature. The L1 fracture is new from the prior exam. IMPRESSION: CT of the chest: Scattered small parenchymal nodules some of which are stable from the previous exam from 4 years previous. They all measure less than 5 mm. No follow-up needed if patient is low-risk (and has no known or suspected primary neoplasm). Non-contrast chest CT can be considered in 12 months if patient is high-risk. This recommendation follows the consensus statement: Guidelines for Management of Incidental Pulmonary Nodules Detected on CT Images: From the Fleischner Society 2017; Radiology 2017; 284:228-243. CT of the abdomen and pelvis: Previously seen left renal stone is not well appreciated on today's exam. Chronic changes as described without acute abnormality. Aortic Atherosclerosis (ICD10-I70.0). Electronically Signed   By: Inez Catalina M.D.   On: 10/13/2019 20:39   Ct Head Wo Contrast  Result Date: 10/13/2019 CLINICAL DATA:  Encephalopathy EXAM: CT HEAD WITHOUT CONTRAST TECHNIQUE: Contiguous axial images were obtained from the base of the skull through the vertex without intravenous contrast. COMPARISON:  None. FINDINGS: Brain: There is no mass, hemorrhage or extra-axial collection. There is generalized atrophy without lobar predilection. Hypodensity of the white matter is most commonly associated with chronic microvascular disease. Vascular: No abnormal hyperdensity of the major intracranial arteries or dural  venous sinuses. No intracranial atherosclerosis. Skull: The visualized skull base, calvarium and extracranial soft tissues are normal. Sinuses/Orbits: No fluid levels or advanced mucosal thickening of the visualized paranasal sinuses. No mastoid or middle ear effusion. The orbits are normal. IMPRESSION: Generalized atrophy and chronic microvascular ischemia without acute intracranial abnormality. Electronically Signed   By: Ulyses Jarred M.D.   On: 10/13/2019 20:34   Ct Chest Wo Contrast  Result Date: 10/13/2019 CLINICAL DATA:  Cough with lethargy, recent cough ground emesis EXAM: CT CHEST, ABDOMEN AND PELVIS WITHOUT CONTRAST TECHNIQUE: Multidetector CT imaging of the chest, abdomen and pelvis was performed following the standard protocol without IV contrast. COMPARISON:  12/31/2014, 05/06/2015 FINDINGS: CT CHEST FINDINGS Cardiovascular: Somewhat limited due  to lack of IV contrast. Diffuse aortic calcifications are noted. No aneurysmal dilatation is seen. No cardiac enlargement is noted. Heavy coronary calcifications are noted. Mediastinum/Nodes: The thoracic inlet is within normal limits. No hilar or mediastinal adenopathy is noted. The esophagus as visualized is within normal limits. Lungs/Pleura: Scattered tiny parenchymal nodules are noted bilaterally. These are best visualized on images 53, 92, 52 and 32 of series 5. Some of these were seen on the prior exam. They all measure less than 5 mm. No focal infiltrate or sizable effusion is seen. Mild scarring in the lingula is again seen. Musculoskeletal: Degenerative changes of the thoracic spine are seen. CT ABDOMEN PELVIS FINDINGS Hepatobiliary: No focal liver abnormality is seen. No gallstones, gallbladder wall thickening, or biliary dilatation. Pancreas: Unremarkable. No pancreatic ductal dilatation or surrounding inflammatory changes. Spleen: Normal in size without focal abnormality. Adrenals/Urinary Tract: Adrenal glands are within normal limits. The  kidneys are well visualized bilaterally. No renal calculi or obstructive changes are seen. Previously seen left renal calculus is not well appreciated on today's exam. The bladder is decompressed. Stomach/Bowel: Changes consistent with prior sigmoid surgery are noted with patent anastomosis. The colon is otherwise unremarkable. The appendix is within normal limits. Vascular/Lymphatic: Aortic atherosclerosis. No enlarged abdominal or pelvic lymph nodes. Reproductive: Uterus and bilateral adnexa are unremarkable. Other: No abdominal wall hernia or abnormality. No abdominopelvic ascites. Musculoskeletal: Right hip replacement is noted. Degenerative changes of the lumbar spine are seen. L1 and L5 compression deformities are seen which appear chronic in nature. The L1 fracture is new from the prior exam. IMPRESSION: CT of the chest: Scattered small parenchymal nodules some of which are stable from the previous exam from 4 years previous. They all measure less than 5 mm. No follow-up needed if patient is low-risk (and has no known or suspected primary neoplasm). Non-contrast chest CT can be considered in 12 months if patient is high-risk. This recommendation follows the consensus statement: Guidelines for Management of Incidental Pulmonary Nodules Detected on CT Images: From the Fleischner Society 2017; Radiology 2017; 284:228-243. CT of the abdomen and pelvis: Previously seen left renal stone is not well appreciated on today's exam. Chronic changes as described without acute abnormality. Aortic Atherosclerosis (ICD10-I70.0). Electronically Signed   By: Inez Catalina M.D.   On: 10/13/2019 20:39   US Renal  Result Date: 10/14/2019 CLINICAL DATA:  AKI, possible COVID-19, shortness of breath EXAM: RENAL / URINARY TRACT ULTRASOUND COMPLETE COMPARISON:  CT abdomen pelvis 10/13/2019 FINDINGS: Right Kidney: Renal measurements: 8.8 x 4.4 x 5.1 cm = volume: 105 mL. Echogenic, shadowing calculus seen in the mid to lower pole  right kidney measuring 1 cm in maximal diameter. Left Kidney: Patient terminated the exam early. Bladder: Patient terminated the exam early. Other: None. IMPRESSION: 1 cm calculus in the interpolar to lower pole right kidney. No hydronephrosis. The left kidney and bladder are not visualized due to early termination of the exam by the patient. Electronically Signed   By: Lovena Le M.D.   On: 10/14/2019 01:07   Dg Chest Port 1 View  Result Date: 10/13/2019 CLINICAL DATA:  Chest pain and shortness of breath EXAM: PORTABLE CHEST 1 VIEW COMPARISON:  11/10/2016 FINDINGS: Cardiac shadow is stable. Aortic calcifications are again seen. The lungs are well aerated bilaterally with the exception of stable left basilar scarring and effusion no new focal infiltrate is seen. No acute bony abnormality is noted. IMPRESSION: Chronic scarring in the left base.  No acute abnormality seen. Electronically Signed  By: Inez Catalina M.D.   On: 10/13/2019 19:58    Scheduled Meds:  atorvastatin  40 mg Oral Daily   cholecalciferol  1,000 Units Oral Daily   feeding supplement (ENSURE ENLIVE)  237 mL Oral BID BM   insulin aspart  0-5 Units Subcutaneous QHS   insulin aspart  0-6 Units Subcutaneous TID WC   memantine  10 mg Oral BID   mirtazapine  7.5 mg Oral QHS   mometasone-formoterol  2 puff Inhalation BID   pantoprazole (PROTONIX) IV  40 mg Intravenous Q12H   Continuous Infusions:  meropenem (MERREM) IV Stopped (10/14/19 0308)    sodium bicarbonate (isotonic) infusion in sterile water 125 mL/hr at 10/14/19 1600     LOS: 1 day    Time spent: 30 minutes.   Barton Dubois, MD [facility] Pager [phone]   10/14/2019, 5:41 PM

## 2019-10-14 NOTE — ED Notes (Signed)
This RN performed bladder scan on pt; 0 ml appeared.

## 2019-10-14 NOTE — ED Notes (Signed)
Call Pharmacy to send medications.

## 2019-10-14 NOTE — ED Notes (Addendum)
This RN received a call from IV team about pt. IV team advised this RN that pt was not a candidate for PICC/Central line placement due to elavated BUN and creatinine. Will update floor RN on IV consult and inform floor RN of attempts to draw labs, specifically repeat lactic acid.

## 2019-10-14 NOTE — ED Notes (Addendum)
This RN bladder scanned pt, 44mL seen. Attempted to replace purewik but noticed stool in brief. When cleaning up stool from buttocks, this RN noticed some excoriation in the perineal area, and what appeared to be a blister-like sore on the buttocks. Will report to floor RN when bed is assigned.

## 2019-10-15 DIAGNOSIS — N170 Acute kidney failure with tubular necrosis: Secondary | ICD-10-CM

## 2019-10-15 LAB — GASTROINTESTINAL PANEL BY PCR, STOOL (REPLACES STOOL CULTURE)

## 2019-10-15 LAB — BASIC METABOLIC PANEL
Anion gap: 23 — ABNORMAL HIGH (ref 5–15)
BUN: 103 mg/dL — ABNORMAL HIGH (ref 8–23)
CO2: 29 mmol/L (ref 22–32)
Calcium: 7.2 mg/dL — ABNORMAL LOW (ref 8.9–10.3)
Chloride: 91 mmol/L — ABNORMAL LOW (ref 98–111)
Creatinine, Ser: 5.18 mg/dL — ABNORMAL HIGH (ref 0.44–1.00)
GFR calc Af Amer: 9 mL/min — ABNORMAL LOW (ref >60)
GFR calc non Af Amer: 7 mL/min — ABNORMAL LOW (ref >60)
Glucose, Bld: 89 mg/dL (ref 70–99)
Potassium: 5 mmol/L (ref 3.5–5.1)
Sodium: 143 mmol/L (ref 135–145)

## 2019-10-15 LAB — GLUCOSE, CAPILLARY
Glucose-Capillary: 68 mg/dL — ABNORMAL LOW (ref 70–99)
Glucose-Capillary: 84 mg/dL (ref 70–99)
Glucose-Capillary: 37 mg/dL — CL (ref 70–99)
Glucose-Capillary: 115 mg/dL — ABNORMAL HIGH (ref 70–99)
Glucose-Capillary: 31 mg/dL — CL (ref 70–99)
Glucose-Capillary: 104 mg/dL — ABNORMAL HIGH (ref 70–99)
Glucose-Capillary: 187 mg/dL — ABNORMAL HIGH (ref 70–99)
Glucose-Capillary: 219 mg/dL — ABNORMAL HIGH (ref 70–99)

## 2019-10-15 MED ORDER — GLUCAGON HCL RDNA (DIAGNOSTIC) 1 MG IJ SOLR
INTRAMUSCULAR | Status: AC
  Administered 2019-10-15: 2 mg
  Filled 2019-10-15: qty 2

## 2019-10-15 MED ORDER — METRONIDAZOLE IN NACL 5-0.79 MG/ML-% IV SOLN
500.0000 mg | Freq: Three times a day (TID) | INTRAVENOUS | Status: DC
  Administered 2019-10-15 – 2019-10-17 (×7): 500 mg via INTRAVENOUS
  Filled 2019-10-15 (×7): qty 100

## 2019-10-15 MED ORDER — DEXTROSE 50 % IV SOLN
INTRAVENOUS | Status: AC
  Administered 2019-10-15: 25 mL
  Filled 2019-10-15: qty 50

## 2019-10-15 MED ORDER — SODIUM CHLORIDE 0.9 % IV SOLN
1.0000 g | INTRAVENOUS | Status: DC
  Administered 2019-10-15: 1 g via INTRAVENOUS
  Filled 2019-10-15: qty 10

## 2019-10-15 MED ORDER — GLUCAGON HCL RDNA (DIAGNOSTIC) 1 MG IJ SOLR
INTRAMUSCULAR | Status: AC
  Administered 2019-10-15: 1 mg
  Filled 2019-10-15: qty 1

## 2019-10-15 MED ORDER — MAGNESIUM SULFATE 2 GM/50ML IV SOLN
2.0000 g | Freq: Once | INTRAVENOUS | Status: AC
  Administered 2019-10-15: 2 g via INTRAVENOUS
  Filled 2019-10-15: qty 50

## 2019-10-15 NOTE — Progress Notes (Signed)
[facility] called with results, patient positive for Salmonella. MD notified. Wendee Copp

## 2019-10-15 NOTE — Progress Notes (Addendum)
PROGRESS NOTE    BISAN BLIGHT  [id number] DOB: [birth year] DOA: 10/13/2019 PCP: Hennie Duos, MD    Brief Narrative: 78 year old female with history of Alzheimer's dementia, CAD s/p PCI, chronic diastolic congestive heart failure, COPD, insulin-dependent diabetes mellitus, hypertension, hyperlipidemia was brought to the emergency department for lethargy, altered mental status.  Nursing facility reported that patient had vomiting and diarrhea started on Friday.  Patient came to the emergency department was found to be hypoglycemic.  Patient also had one single episode of coffee-ground emesis 3 days ago.  Patient is DO NOT RESUSCITATE.  Work-up in the emergency department patient is found to have WBC count of 25,000, lactic acid 6.7, BUN 106, creatinine 7.2.  Patient's baseline creatinine is 0.8.  Patient is also found to have bicarb of 10, potassium 5.1, anion gap of 27.  COVID-19 came back to be negative.  Blood cultures were obtained.  Chest x-ray did not show any infiltrates.  CT head without contrast showed no acute intracranial abnormality.  CT abdomen and pelvis, CT chest without any abnormalities.  Assessment & Plan:   Principal Problem:   Acute renal failure (ARF) (HCC) Active Problems:   Lactic acidosis   Metabolic acidosis   Acute metabolic encephalopathy   Viral gastroenteritis   AKI (acute kidney injury) ([place])   Assessment & Plan: ##Acute renal failure (ARF) (HCC) -Appears to be prerenal, cannot exclude ATN  -avoid nephrotoxic agents -Continue aggressive fluid resuscitation -Follow-up renal function trend -Renal ultrasound demonstrating no acute hydronephrosis or obstructive uropathy. -Nephrology is consulted  ##Severe sepsis -Admitted with elevated lactic acid of 7, elevated white blood cell count of 25,000, tachycardia -Most likely from gastroenteritis -Keep the patient on Rocephin, Flagyl -Follow-up with blood cultures -Check C. difficile  toxin-negative  ##acute metabolic encephalopathy -Secondary to uremia and electrolyte imbalances, sepsis -Patient with underlying dementia; unknown baseline -Treat underlying conditions and follow-up -Hold any sedating medications  ##Acute gastroenteritis -Keep the patient on Rocephin, Flagyl -Get C. difficile toxin-negative -Continue with IV fluids  ##GI bleed -Patient had one episode of coffee-ground emesis -Continue with IV Protonix -Closely follow-up with CBC  ##Hyperkalemia -From metabolic acidosis -Correct the underlying condition and follow-up   ##Hypomagnesemia -Replace magnesium by IV -  ##Severe dehydration -Secondary to decreased p.o. intake, diarrhea -Continue with IV fluids  ##dementia without behavioral disorder -Continue Namenda -Continue supportive care.  ##depression and poor oral intake -Continue Remeron.  ##Diabetes mellitus -Hemoglobin A1c 6.7 -Follow-up on sliding scale insulin    DVT prophylaxis: SCDs  code Status: DNR  family Communication: Updated patient's daughter Ms.  Lenore Manner Disposition Plan: Pending clinical improvement  Consultants:   Nephrology   Antimicrobials: Rocephin 10/15/2019 Flagyl 10/15/2019   subjective: Patient is somnolent.  Opens eyes.  Did not follow any commands.  Objective: Vitals:   10/14/19 1935 10/14/19 2348 10/15/19 0425 10/15/19 0821  BP: (!) 113/46 (!) 113/43 (!) 136/46 (!) 123/40  Pulse: 82 81 84 79  Resp: 18 14 17 18   Temp: 97.9 F (36.6 C) 98 F (36.7 C) 97.8 F (36.6 C) (!) 97.5 F (36.4 C)  TempSrc: Axillary Oral  Oral  SpO2: 95% 98%  97%  Weight:      Height:        Intake/Output Summary (Last 24 hours) at 10/15/2019 0942 Last data filed at 10/15/2019 0500 Gross per 24 hour  Intake 3451.01 ml  Output --  Net 3451.01 ml   Filed Weights   10/14/19 0335  Weight: 57.5 kg  Examination:  General exam: Appears calm and comfortable.  I did not follow any  commands Respiratory system: Clear to auscultation. Respiratory effort normal. Cardiovascular system: S1 & S2 heard, RRR. No JVD, murmurs, rubs, gallops or clicks. No pedal edema. Gastrointestinal system: Abdomen is nondistended, soft and nontender. No organomegaly or masses felt. Normal bowel sounds heard. Central nervous system: Alert and not oriented x3.  Unable to examine sensory, motor Extremities: Symmetric 5 x 5 power. Skin: No rashes, lesions or ulcers Psychiatry: Unable to assess.    Data Reviewed: I have personally reviewed following labs and imaging studies  CBC: Recent Labs  Lab 10/13/19 2010 10/14/19 0528  WBC 25.5* 21.9*  NEUTROABS 23.2*  --   HGB 15.3* 11.8*  HCT 48.1* 36.8  MCV 101.5* 98.9  PLT 230 [id number]   Basic Metabolic Panel: Recent Labs  Lab 10/13/19 2117 10/14/19 0528 10/15/19 0601  NA 143 146* 143  K 5.1 4.4 5.0  CL 106 106 91*  CO2 10* 15* 29  GLUCOSE 189* 128* 89  BUN 106* 104* 103*  CREATININE 7.23* 6.57* 5.18*  CALCIUM 10.1 8.5* 7.2*  MG  --  1.7  --   PHOS  --  7.5*  --    GFR: Estimated Creatinine Clearance: 7.9 mL/min (A) (by C-G formula based on SCr of 5.18 mg/dL (H)). Liver Function Tests: Recent Labs  Lab 10/13/19 2117 10/14/19 0528  AST 29  --   ALT 16  --   ALKPHOS 105  --   BILITOT 0.6  --   PROT 9.1*  --   ALBUMIN 3.5 2.6*   Recent Labs  Lab 10/13/19 2117  LIPASE 20   No results for input(s): AMMONIA in the last 168 hours. Coagulation Profile: Recent Labs  Lab 10/13/19 2159  INR 1.2   Cardiac Enzymes: No results for input(s): CKTOTAL, CKMB, CKMBINDEX, TROPONINI in the last 168 hours. BNP (last 3 results) No results for input(s): PROBNP in the last 8760 hours. HbA1C: Recent Labs    10/14/19 0528  HGBA1C 6.7*   CBG: Recent Labs  Lab 10/15/19 0156 10/15/19 0416 10/15/19 0617 10/15/19 0656 10/15/19 0718  GLUCAP 68* 84 37* 31* 115*   Lipid Profile: No results for input(s): CHOL, HDL, LDLCALC, TRIG,  CHOLHDL, LDLDIRECT in the last 72 hours. Thyroid Function Tests: No results for input(s): TSH, T4TOTAL, FREET4, T3FREE, THYROIDAB in the last 72 hours. Anemia Panel: No results for input(s): VITAMINB12, FOLATE, FERRITIN, TIBC, IRON, RETICCTPCT in the last 72 hours. Sepsis Labs: Recent Labs  Lab 10/13/19 2010 10/14/19 0528 10/14/19 0820  PROCALCITON  --  1.99  --   LATICACIDVEN 6.7* 5.3* 4.3*    Recent Results (from the past 240 hour(s))  Blood Culture (routine x 2)     Status: None (Preliminary result)   Collection Time: 10/13/19 10:00 PM   Specimen: BLOOD RIGHT WRIST  Result Value Ref Range Status   Specimen Description BLOOD RIGHT WRIST  Final   Special Requests   Final    BOTTLES DRAWN AEROBIC AND ANAEROBIC Blood Culture results may not be optimal due to an inadequate volume of blood received in culture bottles   Culture   Final    NO GROWTH < 24 HOURS Performed at [facility], [place] [address] [place], [place] [address]    Report Status PENDING  Incomplete  Blood Culture (routine x 2)     Status: None (Preliminary result)   Collection Time: 10/13/19 10:00 PM   Specimen: BLOOD  Result Value Ref Range Status   Specimen Description BLOOD LEFT ANKLE  Final   Special Requests   Final    BOTTLES DRAWN AEROBIC ONLY Blood Culture results may not be optimal due to an inadequate volume of blood received in culture bottles   Culture   Final    NO GROWTH < 24 HOURS Performed at [facility] [address] [place], [place] [address]    Report Status PENDING  Incomplete  SARS CORONAVIRUS 2 (TAT 6-24 HRS) Nasopharyngeal Nasopharyngeal Swab     Status: None   Collection Time: 10/13/19 10:59 PM   Specimen: Nasopharyngeal Swab  Result Value Ref Range Status   SARS Coronavirus 2 NEGATIVE NEGATIVE Final    Comment: (NOTE) SARS-CoV-2 target nucleic acids are NOT DETECTED. The SARS-CoV-2 RNA is generally detectable in upper and lower respiratory specimens during the acute  phase of infection. Negative results do not preclude SARS-CoV-2 infection, do not rule out co-infections with other pathogens, and should not be used as the sole basis for treatment or other patient management decisions. Negative results must be combined with clinical observations, patient history, and epidemiological information. The expected result is Negative. Fact Sheet for Patients: SugarRoll.be Fact Sheet for Healthcare Providers: https://www.woods-mathews.com/ This test is not yet approved or cleared by the [place] FDA and  has been authorized for detection and/or diagnosis of SARS-CoV-2 by FDA under an Emergency Use Authorization (EUA). This EUA will remain  in effect (meaning this test can be used) for the duration of the COVID-19 declaration under Section 56 4(b)(1) of the Act, 21 U.S.C. section 360bbb-3(b)(1), unless the authorization is terminated or revoked sooner. Performed at [facility], [place] [address] [place], [place] [address]   C difficile quick scan w PCR reflex     Status: None   Collection Time: 10/14/19  4:01 AM   Specimen: Stool  Result Value Ref Range Status   C Diff antigen NEGATIVE NEGATIVE Final   C Diff toxin NEGATIVE NEGATIVE Final   C Diff interpretation No C. difficile detected.  Final    Comment: Performed at [facility], [place] [name], [place], [place] [address]         Radiology Studies: Ct Abdomen Pelvis Wo Contrast  Result Date: 10/13/2019 CLINICAL DATA:  Cough with lethargy, recent cough ground emesis EXAM: CT CHEST, ABDOMEN AND PELVIS WITHOUT CONTRAST TECHNIQUE: Multidetector CT imaging of the chest, abdomen and pelvis was performed following the standard protocol without IV contrast. COMPARISON:  12/31/2014, 05/06/2015 FINDINGS: CT CHEST FINDINGS Cardiovascular: Somewhat limited due to lack of IV contrast. Diffuse aortic calcifications are noted. No aneurysmal dilatation is  seen. No cardiac enlargement is noted. Heavy coronary calcifications are noted. Mediastinum/Nodes: The thoracic inlet is within normal limits. No hilar or mediastinal adenopathy is noted. The esophagus as visualized is within normal limits. Lungs/Pleura: Scattered tiny parenchymal nodules are noted bilaterally. These are best visualized on images 53, 92, 52 and 32 of series 5. Some of these were seen on the prior exam. They all measure less than 5 mm. No focal infiltrate or sizable effusion is seen. Mild scarring in the lingula is again seen. Musculoskeletal: Degenerative changes of the thoracic spine are seen. CT ABDOMEN PELVIS FINDINGS Hepatobiliary: No focal liver abnormality is seen. No gallstones, gallbladder wall thickening, or biliary dilatation. Pancreas: Unremarkable. No pancreatic ductal dilatation or surrounding inflammatory changes. Spleen: Normal in size without focal abnormality. Adrenals/Urinary Tract: Adrenal glands are within normal limits. The  kidneys are well visualized bilaterally. No renal calculi or obstructive changes are seen. Previously seen left renal calculus is not well appreciated on today's exam. The bladder is decompressed. Stomach/Bowel: Changes consistent with prior sigmoid surgery are noted with patent anastomosis. The colon is otherwise unremarkable. The appendix is within normal limits. Vascular/Lymphatic: Aortic atherosclerosis. No enlarged abdominal or pelvic lymph nodes. Reproductive: Uterus and bilateral adnexa are unremarkable. Other: No abdominal wall hernia or abnormality. No abdominopelvic ascites. Musculoskeletal: Right hip replacement is noted. Degenerative changes of the lumbar spine are seen. L1 and L5 compression deformities are seen which appear chronic in nature. The L1 fracture is new from the prior exam. IMPRESSION: CT of the chest: Scattered small parenchymal nodules some of which are stable from the previous exam from 4 years previous. They all measure less than  5 mm. No follow-up needed if patient is low-risk (and has no known or suspected primary neoplasm). Non-contrast chest CT can be considered in 12 months if patient is high-risk. This recommendation follows the consensus statement: Guidelines for Management of Incidental Pulmonary Nodules Detected on CT Images: From the Fleischner Society 2017; Radiology 2017; 284:228-243. CT of the abdomen and pelvis: Previously seen left renal stone is not well appreciated on today's exam. Chronic changes as described without acute abnormality. Aortic Atherosclerosis (ICD10-I70.0). Electronically Signed   By: Inez Catalina M.D.   On: 10/13/2019 20:39   Ct Head Wo Contrast  Result Date: 10/13/2019 CLINICAL DATA:  Encephalopathy EXAM: CT HEAD WITHOUT CONTRAST TECHNIQUE: Contiguous axial images were obtained from the base of the skull through the vertex without intravenous contrast. COMPARISON:  None. FINDINGS: Brain: There is no mass, hemorrhage or extra-axial collection. There is generalized atrophy without lobar predilection. Hypodensity of the white matter is most commonly associated with chronic microvascular disease. Vascular: No abnormal hyperdensity of the major intracranial arteries or dural venous sinuses. No intracranial atherosclerosis. Skull: The visualized skull base, calvarium and extracranial soft tissues are normal. Sinuses/Orbits: No fluid levels or advanced mucosal thickening of the visualized paranasal sinuses. No mastoid or middle ear effusion. The orbits are normal. IMPRESSION: Generalized atrophy and chronic microvascular ischemia without acute intracranial abnormality. Electronically Signed   By: Ulyses Jarred M.D.   On: 10/13/2019 20:34   Ct Chest Wo Contrast  Result Date: 10/13/2019 CLINICAL DATA:  Cough with lethargy, recent cough ground emesis EXAM: CT CHEST, ABDOMEN AND PELVIS WITHOUT CONTRAST TECHNIQUE: Multidetector CT imaging of the chest, abdomen and pelvis was performed following the standard  protocol without IV contrast. COMPARISON:  12/31/2014, 05/06/2015 FINDINGS: CT CHEST FINDINGS Cardiovascular: Somewhat limited due to lack of IV contrast. Diffuse aortic calcifications are noted. No aneurysmal dilatation is seen. No cardiac enlargement is noted. Heavy coronary calcifications are noted. Mediastinum/Nodes: The thoracic inlet is within normal limits. No hilar or mediastinal adenopathy is noted. The esophagus as visualized is within normal limits. Lungs/Pleura: Scattered tiny parenchymal nodules are noted bilaterally. These are best visualized on images 53, 92, 52 and 32 of series 5. Some of these were seen on the prior exam. They all measure less than 5 mm. No focal infiltrate or sizable effusion is seen. Mild scarring in the lingula is again seen. Musculoskeletal: Degenerative changes of the thoracic spine are seen. CT ABDOMEN PELVIS FINDINGS Hepatobiliary: No focal liver abnormality is seen. No gallstones, gallbladder wall thickening, or biliary dilatation. Pancreas: Unremarkable. No pancreatic ductal dilatation or surrounding inflammatory changes. Spleen: Normal in size without focal abnormality. Adrenals/Urinary Tract: Adrenal glands are within normal limits.  The kidneys are well visualized bilaterally. No renal calculi or obstructive changes are seen. Previously seen left renal calculus is not well appreciated on today's exam. The bladder is decompressed. Stomach/Bowel: Changes consistent with prior sigmoid surgery are noted with patent anastomosis. The colon is otherwise unremarkable. The appendix is within normal limits. Vascular/Lymphatic: Aortic atherosclerosis. No enlarged abdominal or pelvic lymph nodes. Reproductive: Uterus and bilateral adnexa are unremarkable. Other: No abdominal wall hernia or abnormality. No abdominopelvic ascites. Musculoskeletal: Right hip replacement is noted. Degenerative changes of the lumbar spine are seen. L1 and L5 compression deformities are seen which appear  chronic in nature. The L1 fracture is new from the prior exam. IMPRESSION: CT of the chest: Scattered small parenchymal nodules some of which are stable from the previous exam from 4 years previous. They all measure less than 5 mm. No follow-up needed if patient is low-risk (and has no known or suspected primary neoplasm). Non-contrast chest CT can be considered in 12 months if patient is high-risk. This recommendation follows the consensus statement: Guidelines for Management of Incidental Pulmonary Nodules Detected on CT Images: From the Fleischner Society 2017; Radiology 2017; 284:228-243. CT of the abdomen and pelvis: Previously seen left renal stone is not well appreciated on today's exam. Chronic changes as described without acute abnormality. Aortic Atherosclerosis (ICD10-I70.0). Electronically Signed   By: Inez Catalina M.D.   On: 10/13/2019 20:39   US Renal  Result Date: 10/14/2019 CLINICAL DATA:  AKI, possible COVID-19, shortness of breath EXAM: RENAL / URINARY TRACT ULTRASOUND COMPLETE COMPARISON:  CT abdomen pelvis 10/13/2019 FINDINGS: Right Kidney: Renal measurements: 8.8 x 4.4 x 5.1 cm = volume: 105 mL. Echogenic, shadowing calculus seen in the mid to lower pole right kidney measuring 1 cm in maximal diameter. Left Kidney: Patient terminated the exam early. Bladder: Patient terminated the exam early. Other: None. IMPRESSION: 1 cm calculus in the interpolar to lower pole right kidney. No hydronephrosis. The left kidney and bladder are not visualized due to early termination of the exam by the patient. Electronically Signed   By: Lovena Le M.D.   On: 10/14/2019 01:07   Dg Chest Port 1 View  Result Date: 10/13/2019 CLINICAL DATA:  Chest pain and shortness of breath EXAM: PORTABLE CHEST 1 VIEW COMPARISON:  11/10/2016 FINDINGS: Cardiac shadow is stable. Aortic calcifications are again seen. The lungs are well aerated bilaterally with the exception of stable left basilar scarring and effusion no  new focal infiltrate is seen. No acute bony abnormality is noted. IMPRESSION: Chronic scarring in the left base.  No acute abnormality seen. Electronically Signed   By: Inez Catalina M.D.   On: 10/13/2019 19:58        Scheduled Meds:  atorvastatin  40 mg Oral Daily   cholecalciferol  1,000 Units Oral Daily   feeding supplement (ENSURE ENLIVE)  237 mL Oral BID BM   insulin aspart  0-5 Units Subcutaneous QHS   insulin aspart  0-6 Units Subcutaneous TID WC   memantine  10 mg Oral BID   mirtazapine  7.5 mg Oral QHS   mometasone-formoterol  2 puff Inhalation BID   pantoprazole (PROTONIX) IV  40 mg Intravenous Q12H   Continuous Infusions:   sodium bicarbonate (isotonic) infusion in sterile water 125 mL/hr at 10/15/19 0500     LOS: 2 days    Time spent: 40 minutes    Judie Hollick, MD [facility] Pager [phone]  If 7PM-7AM, please contact night-coverage [email or web address] Password [facility] 10/15/2019, 9:42 AM

## 2019-10-15 NOTE — Progress Notes (Signed)
Pt CBG @ 0156 was 68. 1mg  Glucagon administered. Follow-up CBG was 84. Will continue to monitor.

## 2019-10-15 NOTE — Progress Notes (Signed)
Pt CBG @ 0617 was 37. 2mg  Glucagon were administered. Will continue to monitor pt.

## 2019-10-15 NOTE — Progress Notes (Addendum)
Pt CBG upon re-check was 31. 87mL glucose  administered. Will re-check. Day shift notified. Pt is non-compiiant with PO intake. Upon re-check, pt CBG is now 115.

## 2019-10-15 NOTE — Evaluation (Signed)
Physical Therapy Evaluation Patient Details Name: [name] MRN: [id number] DOB: 08/07/1941 Today's Date: 10/15/2019   History of Present Illness  78yo female presenting from SNF with lethargy and AMS, recent history of vomiting and diarrhea. CT negative for actue intracranial changes. Admitted due to ARF, severe metabolic acidosis, possible viral gastroenteritis. PMH advanced Alzheimers/dementia, CAD, cardiomyopathy, CHF, COPD, DM, fibromyalgia, HTN, HLD, cardiac cath, THA  Clinical Impression   Patient received asleep in bed, opens eyes and responds slightly to max verbal and tactile stimulation but grossly unable to follow even simple cues like squeezing therapists fingers or moving legs in bed. Required totalA for rolling with no initiation noted despite Max cues. Note hamstring and gastroc contractures in B LEs, with L LE seeming much more impaired than R LE. Withdraws to uncomfortable stimuli such as LE PROM. Unsure of current level of function, per chart review seems that she is usually much more alert at her facility but baseline involves poor command following, also seems that she is usually up in [facility] but unsure of baseline level for transfers. Feel that with exception of current lethargy, she may be close to baseline in terms of mobility given general physical presentation today. Recommend return to SNF when medically appropriate, will plan to trial 1x/week on caseload and can increase frequency if alertness/participation with therapy improves.     Follow Up Recommendations SNF;Supervision/Assistance - 24 hour(return to [employer] [employer])    Equipment Recommendations  None recommended by PT    Recommendations for Other Services       Precautions / Restrictions Precautions Precautions: Fall;Other (comment) Precaution Comments: advanced dementia Restrictions Weight Bearing Restrictions: No      Mobility  Bed Mobility Overal bed mobility: Needs Assistance Bed Mobility:  Rolling Rolling: Total assist         General bed mobility comments: totalA for rolling in bed, will likely require lift for OOB  Transfers                 General transfer comment: unsure of transfer ability at baseline- will need +2 to attempt  Ambulation/Gait             General Gait Details: unsure of ambulatory ability at baseline (suspect non-ambulatory but family not present to clarify)  Stairs            Wheelchair Mobility    Modified Rankin (Stroke Patients Only)       Balance                                             Pertinent Vitals/Pain Pain Assessment: Faces Faces Pain Scale: Hurts little more Pain Location: with PROM of B LEs, with rolling Pain Descriptors / Indicators: Discomfort;Grimacing Pain Intervention(s): Limited activity within patient's tolerance;Monitored during session;Repositioned    Home Living Family/patient expects to be discharged to:: Skilled nursing facility                 Additional Comments: all information taken from prior charting, patient A&Ox0    Prior Function Level of Independence: Needs assistance   Gait / Transfers Assistance Needed: over a year ago, was using RW- unsure of current status  ADL's / Homemaking Assistance Needed: needed assistance for dressing/bathing at ALF- unsure of current status  Comments: unable to determine current baseline, no family present and patient A&Ox0     Hand  Dominance   Dominant Hand: Right    Extremity/Trunk Assessment   Upper Extremity Assessment Upper Extremity Assessment: Generalized weakness    Lower Extremity Assessment Lower Extremity Assessment: Difficult to assess due to impaired cognition RLE Deficits / Details: PROM- mild hamstring and gastroc contractures but able to range; unable to assess MMT due to cognition LLE Deficits / Details: moderate hamstring contracture, withdraws L LE even with light touch; unable to assess MMT  due to cognition    Cervical / Trunk Assessment Cervical / Trunk Assessment: Kyphotic  Communication   Communication: No difficulties  Cognition Arousal/Alertness: Lethargic Behavior During Therapy: Flat affect Overall Cognitive Status: Impaired/Different from baseline Area of Impairment: Orientation;Attention;Following commands;Awareness                 Orientation Level: Disoriented to;Person;Place;Time;Situation Current Attention Level: Focused   Following Commands: Follows one step commands inconsistently;Follows multi-step commands consistently   Awareness: Intellectual   General Comments: A&Ox0, opens eyes with Mod-max VC/TC, makes faces and moans with PROM of LEs and rolling in bed, unable to follow commands      General Comments General comments (skin integrity, edema, etc.): unable to get to EOB to assess balance with assist of +1, would assume poor to zero sitting balance    Exercises     Assessment/Plan    PT Assessment Patient needs continued PT services  PT Problem List Decreased strength;Decreased cognition;Decreased safety awareness;Decreased balance;Decreased mobility       PT Treatment Interventions DME instruction;Balance training;Gait training;Neuromuscular re-education;Stair training;Functional mobility training;Patient/family education;Therapeutic activities;Therapeutic exercise    PT Goals (Current goals can be found in the Care Plan section)  Acute Rehab PT Goals PT Goal Formulation: Patient unable to participate in goal setting Time For Goal Achievement: 10/29/19 Potential to Achieve Goals: Fair    Frequency Min 1X/week   Barriers to discharge        Co-evaluation               AM-PAC PT "6 Clicks" Mobility  Outcome Measure Help needed turning from your back to your side while in a flat bed without using bedrails?: A Lot Help needed moving from lying on your back to sitting on the side of a flat bed without using bedrails?:  Total Help needed moving to and from a bed to a chair (including a wheelchair)?: Total Help needed standing up from a chair using your arms (e.g., wheelchair or bedside chair)?: Total Help needed to walk in hospital room?: Total Help needed climbing 3-5 steps with a railing? : Total 6 Click Score: 7    End of Session   Activity Tolerance: Patient limited by lethargy;Other (comment)(and poor baseline cognition) Patient left: in bed;with call bell/phone within reach;with bed alarm set   PT Visit Diagnosis: Muscle weakness (generalized) (M62.81);Other abnormalities of gait and mobility (R26.89)    Time: [id number] PT Time Calculation (min) (ACUTE ONLY): 10 min   Charges:   PT Evaluation $PT Eval Moderate Complexity: 1 Mod          Windell Norfolk, DPT, PN1   Supplemental Physical Therapist [place]    Pager [phone] Acute Rehab Office [phone]

## 2019-10-15 NOTE — Progress Notes (Signed)
Pt CBG @ 2054 was 64. 1mg  Glucagon was administered.

## 2019-10-16 LAB — CBC
HCT: 32.6 % — ABNORMAL LOW (ref 36.0–46.0)
Hemoglobin: 10.9 g/dL — ABNORMAL LOW (ref 12.0–15.0)
MCH: 31.5 pg (ref 26.0–34.0)
MCHC: 33.4 g/dL (ref 30.0–36.0)
MCV: 94.2 fL (ref 80.0–100.0)
Platelets: 126 10*3/uL — ABNORMAL LOW (ref 150–400)
RBC: 3.46 MIL/uL — ABNORMAL LOW (ref 3.87–5.11)
RDW: 12.4 % (ref 11.5–15.5)
WBC: 8.3 10*3/uL (ref 4.0–10.5)
nRBC: 0 % (ref 0.0–0.2)

## 2019-10-16 LAB — BLOOD CULTURE ID PANEL (REFLEXED)

## 2019-10-16 LAB — BASIC METABOLIC PANEL
Anion gap: 19 — ABNORMAL HIGH (ref 5–15)
BUN: 57 mg/dL — ABNORMAL HIGH (ref 8–23)
CO2: 43 mmol/L — ABNORMAL HIGH (ref 22–32)
Calcium: 6.9 mg/dL — ABNORMAL LOW (ref 8.9–10.3)
Chloride: 86 mmol/L — ABNORMAL LOW (ref 98–111)
Creatinine, Ser: 2.31 mg/dL — ABNORMAL HIGH (ref 0.44–1.00)
GFR calc Af Amer: 23 mL/min — ABNORMAL LOW (ref >60)
GFR calc non Af Amer: 20 mL/min — ABNORMAL LOW (ref >60)
Glucose, Bld: 148 mg/dL — ABNORMAL HIGH (ref 70–99)
Potassium: 2.4 mmol/L — CL (ref 3.5–5.1)
Sodium: 148 mmol/L — ABNORMAL HIGH (ref 135–145)

## 2019-10-16 LAB — GLUCOSE, CAPILLARY
Glucose-Capillary: 135 mg/dL — ABNORMAL HIGH (ref 70–99)
Glucose-Capillary: 259 mg/dL — ABNORMAL HIGH (ref 70–99)
Glucose-Capillary: 243 mg/dL — ABNORMAL HIGH (ref 70–99)
Glucose-Capillary: 176 mg/dL — ABNORMAL HIGH (ref 70–99)

## 2019-10-16 LAB — MAGNESIUM: Magnesium: 2.5 mg/dL — ABNORMAL HIGH (ref 1.7–2.4)

## 2019-10-16 MED ORDER — SODIUM CHLORIDE 0.9 % IV SOLN
2.0000 g | INTRAVENOUS | Status: DC
  Administered 2019-10-17 – 2019-10-21 (×5): 2 g via INTRAVENOUS
  Filled 2019-10-16: qty 2
  Filled 2019-10-16: qty 20
  Filled 2019-10-16 (×3): qty 2

## 2019-10-16 MED ORDER — POTASSIUM CHLORIDE 10 MEQ/100ML IV SOLN
10.0000 meq | INTRAVENOUS | Status: AC
  Administered 2019-10-16 (×2): 10 meq via INTRAVENOUS
  Filled 2019-10-16 (×2): qty 100

## 2019-10-16 MED ORDER — POTASSIUM CHLORIDE 20 MEQ PO PACK
40.0000 meq | PACK | ORAL | Status: AC
  Administered 2019-10-16 (×3): 40 meq via ORAL
  Filled 2019-10-16 (×2): qty 2

## 2019-10-16 NOTE — Progress Notes (Signed)
PHARMACY - PHYSICIAN COMMUNICATION CRITICAL VALUE ALERT - BLOOD CULTURE IDENTIFICATION (BCID)  [name] is an 78 y.o. female who presented to [facility] on 10/13/2019 with GI infection  Assessment: 3/4 bottles with gram neg rods - enterobacteriaceae on BCID. Salmonella on GI panel so assuming blood is same  Name of physician (or Provider) Contacted: Dr Lunette Stands  Current antibiotics: Ceftriaxone Flagyl  Changes to prescribed antibiotics recommended:  Increase ceftriaxone to 2 g q24h  Results for orders placed or performed during the hospital encounter of 10/13/19  Blood Culture ID Panel (Reflexed) (Collected: 10/13/2019 10:00 PM)  Result Value Ref Range   Enterococcus species NOT DETECTED NOT DETECTED   Listeria monocytogenes NOT DETECTED NOT DETECTED   Staphylococcus species NOT DETECTED NOT DETECTED   Staphylococcus aureus (BCID) NOT DETECTED NOT DETECTED   Streptococcus species NOT DETECTED NOT DETECTED   Streptococcus agalactiae NOT DETECTED NOT DETECTED   Streptococcus pneumoniae NOT DETECTED NOT DETECTED   Streptococcus pyogenes NOT DETECTED NOT DETECTED   Acinetobacter baumannii NOT DETECTED NOT DETECTED   Enterobacteriaceae species DETECTED (A) NOT DETECTED   Enterobacter cloacae complex NOT DETECTED NOT DETECTED   Escherichia coli NOT DETECTED NOT DETECTED   Klebsiella oxytoca NOT DETECTED NOT DETECTED   Klebsiella pneumoniae NOT DETECTED NOT DETECTED   Proteus species NOT DETECTED NOT DETECTED   Serratia marcescens NOT DETECTED NOT DETECTED   Carbapenem resistance NOT DETECTED NOT DETECTED   Haemophilus influenzae NOT DETECTED NOT DETECTED   Neisseria meningitidis NOT DETECTED NOT DETECTED   Pseudomonas aeruginosa NOT DETECTED NOT DETECTED   Candida albicans NOT DETECTED NOT DETECTED   Candida glabrata NOT DETECTED NOT DETECTED   Candida krusei NOT DETECTED NOT DETECTED   Candida parapsilosis NOT DETECTED NOT DETECTED   Candida tropicalis NOT DETECTED NOT  DETECTED   Barth Kirks, PharmD, BCPS, BCCCP Clinical Pharmacist [phone]  Please check AMION for all [place] numbers  10/16/2019 10:44 AM

## 2019-10-16 NOTE — Progress Notes (Signed)
Went in to give patient her [facility] treatment.  Had already scanned med and was doing her HR and BS.  Kept trying to wake patient up, but patient was very lethargic.  Would open her eyes but then would close them again.  Patient at this state is unable to use her inhaler med.  RN was made aware.

## 2019-10-16 NOTE — Progress Notes (Signed)
Lab called K  Level 2.4 Central tele called patient had a 12 beat run of VT

## 2019-10-16 NOTE — Progress Notes (Signed)
PROGRESS NOTE    [name]  [id number] DOB: 02/24/1941 DOA: 10/13/2019 PCP: Hennie Duos, MD    Brief Narrative: 78 year old female with history of Alzheimer's dementia, CAD s/p PCI, chronic diastolic congestive heart failure, COPD, insulin-dependent diabetes mellitus, hypertension, hyperlipidemia was brought to the emergency department for lethargy, altered mental status.  Nursing facility reported that patient had vomiting and diarrhea started on Friday.  Patient came to the emergency department was found to be hypoglycemic.  Patient also had one single episode of coffee-ground emesis 3 days ago.  Patient is DO NOT RESUSCITATE.  Work-up in the emergency department patient is found to have WBC count of 25,000, lactic acid 6.7, BUN 106, creatinine 7.2.  Patient's baseline creatinine is 0.8.  Patient is also found to have bicarb of 10, potassium 5.1, anion gap of 27.  COVID-19 came back to be negative.  Blood cultures were obtained.  Chest x-ray did not show any infiltrates.  CT head without contrast showed no acute intracranial abnormality.  CT abdomen and pelvis, CT chest without any abnormalities.  Assessment & Plan:   Principal Problem:   Acute renal failure (ARF) (HCC) Active Problems:   Lactic acidosis   Metabolic acidosis   Acute metabolic encephalopathy   Viral gastroenteritis   AKI (acute kidney injury) ([place])   Assessment & Plan: ##Acute renal failure (ARF) (HCC) -Appears to be prerenal, cannot exclude ATN  -avoid nephrotoxic agents -Continue aggressive fluid resuscitation -Follow-up renal function trend 6.57->5.18->2.31 -Renal ultrasound demonstrating no acute hydronephrosis or obstructive uropathy. -Nephrology is consulted  ##Severe sepsis -Admitted with elevated lactic acid of 7, elevated white blood cell count of 25,000, tachycardia -Most likely from gastroenteritis -Keep the patient on Rocephin, Flagyl -Follow-up with blood cultures -Check C.  difficile toxin-negative -WBC normalized  ##acute metabolic encephalopathy -Secondary to uremia and electrolyte imbalances, sepsis -Patient with underlying dementia; unknown baseline -Treat underlying conditions and follow-up -Hold any sedating medications -More alert, tolerating diet  ##Acute gastroenteritis -Keep the patient on Rocephin, Flagyl -Get C. difficile toxin-negative -Continue with IV fluids  ##GI bleed -Patient had one episode of coffee-ground emesis -Continue with IV Protonix -Closely follow-up with CBC -Hemoglobin stable  ##Hyperkalemia -From metabolic acidosis -Correct the underlying condition and follow-up  ##Hypokalemia -Potassium is 2.4 today -Replace by mouth. -Patient has normal magnesium level  ##Hypomagnesemia -Replace magnesium by IV -Normalized  ##Severe dehydration -Secondary to decreased p.o. intake, diarrhea -Continue with IV fluids  ##dementia without behavioral disorder -Continue Namenda -Continue supportive care.  ##depression and poor oral intake -Continue Remeron.  ##Diabetes mellitus -Hemoglobin A1c 6.7 -Follow-up on sliding scale insulin    DVT prophylaxis: SCDs  code Status: DNR  family Communication: Updated patient's daughter Ms.  Lenore Manner Disposition Plan: Pending clinical improvement  Consultants:   Nephrology   Antimicrobials: Rocephin 10/15/2019 Flagyl 10/15/2019   subjective: Patient is somnolent.  Opens eyes.  Did not follow any commands.  Objective: Vitals:   10/15/19 2336 10/16/19 0357 10/16/19 0824 10/16/19 1207  BP: (!) 126/47 (!) 133/99 (!) 144/50 (!) 152/79  Pulse: 84 84 80 85  Resp: 16 18 18 18   Temp: 98.5 F (36.9 C) (!) 97.5 F (36.4 C) 97.7 F (36.5 C) 97.8 F (36.6 C)  TempSrc: Oral Oral Oral Oral  SpO2: 92% 91% 100% 100%  Weight:      Height:        Intake/Output Summary (Last 24 hours) at 10/16/2019 1447 Last data filed at 10/16/2019 1300 Gross per 24 hour  Intake  1388.41 ml  Output 300 ml  Net 1088.41 ml   Filed Weights   10/14/19 0335  Weight: 57.5 kg    Examination:  General exam: Appears calm and comfortable.  I did not follow any commands Respiratory system: Clear to auscultation. Respiratory effort normal. Cardiovascular system: S1 & S2 heard, RRR. No JVD, murmurs, rubs, gallops or clicks. No pedal edema. Gastrointestinal system: Abdomen is nondistended, soft and nontender. No organomegaly or masses felt. Normal bowel sounds heard. Central nervous system: Alert and not oriented x3.  Unable to examine sensory, motor Extremities: Symmetric 5 x 5 power. Skin: No rashes, lesions or ulcers Psychiatry: Unable to assess.    Data Reviewed: I have personally reviewed following labs and imaging studies  CBC: Recent Labs  Lab 10/13/19 2010 10/14/19 0528 10/16/19 0340  WBC 25.5* 21.9* 8.3  NEUTROABS 23.2*  --   --   HGB 15.3* 11.8* 10.9*  HCT 48.1* 36.8 32.6*  MCV 101.5* 98.9 94.2  PLT 230 212 123XX123*   Basic Metabolic Panel: Recent Labs  Lab 10/13/19 2117 10/14/19 0528 10/15/19 0601 10/16/19 0727  NA 143 146* 143 148*  K 5.1 4.4 5.0 2.4*  CL 106 106 91* 86*  CO2 10* 15* 29 43*  GLUCOSE 189* 128* 89 148*  BUN 106* 104* 103* 57*  CREATININE 7.23* 6.57* 5.18* 2.31*  CALCIUM 10.1 8.5* 7.2* 6.9*  MG  --  1.7  --  2.5*  PHOS  --  7.5*  --   --    GFR: Estimated Creatinine Clearance: 17.6 mL/min (A) (by C-G formula based on SCr of 2.31 mg/dL (H)). Liver Function Tests: Recent Labs  Lab 10/13/19 2117 10/14/19 0528  AST 29  --   ALT 16  --   ALKPHOS 105  --   BILITOT 0.6  --   PROT 9.1*  --   ALBUMIN 3.5 2.6*   Recent Labs  Lab 10/13/19 2117  LIPASE 20   No results for input(s): AMMONIA in the last 168 hours. Coagulation Profile: Recent Labs  Lab 10/13/19 2159  INR 1.2   Cardiac Enzymes: No results for input(s): CKTOTAL, CKMB, CKMBINDEX, TROPONINI in the last 168 hours. BNP (last 3 results) No results for  input(s): PROBNP in the last 8760 hours. HbA1C: Recent Labs    10/14/19 0528  HGBA1C 6.7*   CBG: Recent Labs  Lab 10/15/19 1238 10/15/19 1634 10/15/19 2121 10/16/19 0607 10/16/19 1149  GLUCAP 104* 187* 219* 135* 259*   Lipid Profile: No results for input(s): CHOL, HDL, LDLCALC, TRIG, CHOLHDL, LDLDIRECT in the last 72 hours. Thyroid Function Tests: No results for input(s): TSH, T4TOTAL, FREET4, T3FREE, THYROIDAB in the last 72 hours. Anemia Panel: No results for input(s): VITAMINB12, FOLATE, FERRITIN, TIBC, IRON, RETICCTPCT in the last 72 hours. Sepsis Labs: Recent Labs  Lab 10/13/19 2010 10/14/19 0528 10/14/19 0820  PROCALCITON  --  1.99  --   LATICACIDVEN 6.7* 5.3* 4.3*    Recent Results (from the past 240 hour(s))  Blood Culture (routine x 2)     Status: None (Preliminary result)   Collection Time: 10/13/19 10:00 PM   Specimen: BLOOD RIGHT WRIST  Result Value Ref Range Status   Specimen Description BLOOD RIGHT WRIST  Final   Special Requests   Final    BOTTLES DRAWN AEROBIC AND ANAEROBIC Blood Culture results may not be optimal due to an inadequate volume of blood received in culture bottles   Culture  Setup Time   Final  GRAM NEGATIVE RODS AEROBIC BOTTLE ONLY Organism ID to follow CRITICAL RESULT CALLED TO, READ BACK BY AND VERIFIED WITH: Hughie Closs PharmD 10:30 10/16/19 ([email or web address])    Culture   Final    CULTURE REINCUBATED FOR BETTER GROWTH Performed at [facility], [place] [address] [place], [place] [address]    Report Status PENDING  Incomplete  Blood Culture (routine x 2)     Status: None (Preliminary result)   Collection Time: 10/13/19 10:00 PM   Specimen: BLOOD  Result Value Ref Range Status   Specimen Description BLOOD LEFT ANKLE  Final   Special Requests   Final    BOTTLES DRAWN AEROBIC ONLY Blood Culture results may not be optimal due to an inadequate volume of blood received in culture bottles   Culture  Setup Time   Final    GRAM NEGATIVE  RODS AEROBIC BOTTLE ONLY CRITICAL VALUE NOTED.  VALUE IS CONSISTENT WITH PREVIOUSLY REPORTED AND CALLED VALUE.    Culture   Final    CULTURE REINCUBATED FOR BETTER GROWTH Performed at [facility], [place] [address] [place],  [address]    Report Status PENDING  Incomplete  Blood Culture ID Panel (Reflexed)     Status: Abnormal   Collection Time: 10/13/19 10:00 PM  Result Value Ref Range Status   Enterococcus species NOT DETECTED NOT DETECTED Final   Listeria monocytogenes NOT DETECTED NOT DETECTED Final   Staphylococcus species NOT DETECTED NOT DETECTED Final   Staphylococcus aureus (BCID) NOT DETECTED NOT DETECTED Final   Streptococcus species NOT DETECTED NOT DETECTED Final   Streptococcus agalactiae NOT DETECTED NOT DETECTED Final   Streptococcus pneumoniae NOT DETECTED NOT DETECTED Final   Streptococcus pyogenes NOT DETECTED NOT DETECTED Final   Acinetobacter baumannii NOT DETECTED NOT DETECTED Final   Enterobacteriaceae species DETECTED (A) NOT DETECTED Final    Comment: Enterobacteriaceae represent a large family of gram negative bacteria, not a single organism. Refer to culture for further identification. CRITICAL RESULT CALLED TO, READ BACK BY AND VERIFIED WITH: Hughie Closs PharmD 10:30 10/16/19 ([email or web address])    Enterobacter cloacae complex NOT DETECTED NOT DETECTED Final   Escherichia coli NOT DETECTED NOT DETECTED Final   Klebsiella oxytoca NOT DETECTED NOT DETECTED Final   Klebsiella pneumoniae NOT DETECTED NOT DETECTED Final   Proteus species NOT DETECTED NOT DETECTED Final   Serratia marcescens NOT DETECTED NOT DETECTED Final   Carbapenem resistance NOT DETECTED NOT DETECTED Final   Haemophilus influenzae NOT DETECTED NOT DETECTED Final   Neisseria meningitidis NOT DETECTED NOT DETECTED Final   Pseudomonas aeruginosa NOT DETECTED NOT DETECTED Final   Candida albicans NOT DETECTED NOT DETECTED Final   Candida glabrata NOT DETECTED NOT DETECTED Final   Candida  krusei NOT DETECTED NOT DETECTED Final   Candida parapsilosis NOT DETECTED NOT DETECTED Final   Candida tropicalis NOT DETECTED NOT DETECTED Final    Comment: Performed at [facility], [place] [address], [place], [place] [address]  SARS CORONAVIRUS 2 (TAT 6-24 HRS) Nasopharyngeal Nasopharyngeal Swab     Status: None   Collection Time: 10/13/19 10:59 PM   Specimen: Nasopharyngeal Swab  Result Value Ref Range Status   SARS Coronavirus 2 NEGATIVE NEGATIVE Final    Comment: (NOTE) SARS-CoV-2 target nucleic acids are NOT DETECTED. The SARS-CoV-2 RNA is generally detectable in upper and lower respiratory specimens during the acute phase of infection. Negative results do not preclude SARS-CoV-2 infection, do not rule out co-infections with other pathogens, and  should not be used as the sole basis for treatment or other patient management decisions. Negative results must be combined with clinical observations, patient history, and epidemiological information. The expected result is Negative. Fact Sheet for Patients: SugarRoll.be Fact Sheet for Healthcare Providers: https://www.woods-mathews.com/ This test is not yet approved or cleared by the [place] FDA and  has been authorized for detection and/or diagnosis of SARS-CoV-2 by FDA under an Emergency Use Authorization (EUA). This EUA will remain  in effect (meaning this test can be used) for the duration of the COVID-19 declaration under Section 56 4(b)(1) of the Act, 21 U.S.C. section 360bbb-3(b)(1), unless the authorization is terminated or revoked sooner. Performed at [facility], [place] [address] [place], [place] [address]   Gastrointestinal Panel by PCR , Stool     Status: Abnormal   Collection Time: 10/14/19  4:01 AM   Specimen: Stool  Result Value Ref Range Status   Campylobacter species NOT DETECTED NOT DETECTED Final   Plesimonas shigelloides NOT DETECTED NOT DETECTED Final    Salmonella species DETECTED (A) NOT DETECTED Final    Comment: RESULT CALLED TO, READ BACK BY AND VERIFIED WITH: HEATHER HUNTER 10/15/19 1339 KLW    Yersinia enterocolitica NOT DETECTED NOT DETECTED Final   Vibrio species NOT DETECTED NOT DETECTED Final   Vibrio cholerae NOT DETECTED NOT DETECTED Final   Enteroaggregative E coli (EAEC) NOT DETECTED NOT DETECTED Final   Enteropathogenic E coli (EPEC) NOT DETECTED NOT DETECTED Final   Enterotoxigenic E coli (ETEC) NOT DETECTED NOT DETECTED Final   Shiga like toxin producing E coli (STEC) NOT DETECTED NOT DETECTED Final   Shigella/Enteroinvasive E coli (EIEC) NOT DETECTED NOT DETECTED Final   Cryptosporidium NOT DETECTED NOT DETECTED Final   Cyclospora cayetanensis NOT DETECTED NOT DETECTED Final   Entamoeba histolytica NOT DETECTED NOT DETECTED Final   Giardia lamblia NOT DETECTED NOT DETECTED Final   Adenovirus F40/41 NOT DETECTED NOT DETECTED Final   Astrovirus NOT DETECTED NOT DETECTED Final   Norovirus GI/GII NOT DETECTED NOT DETECTED Final   Rotavirus A NOT DETECTED NOT DETECTED Final   Sapovirus (I, II, IV, and V) NOT DETECTED NOT DETECTED Final    Comment: Performed at [facility], [place] [address] [place], [place] [address]  C difficile quick scan w PCR reflex     Status: None   Collection Time: 10/14/19  4:01 AM   Specimen: Stool  Result Value Ref Range Status   C Diff antigen NEGATIVE NEGATIVE Final   C Diff toxin NEGATIVE NEGATIVE Final   C Diff interpretation No C. difficile detected.  Final    Comment: Performed at [facility], [place] [address], [place], [place] [address]         Radiology Studies: No results found.      Scheduled Meds: . atorvastatin  40 mg Oral Daily  . cholecalciferol  1,000 Units Oral Daily  . feeding supplement (ENSURE ENLIVE)  237 mL Oral BID BM  . insulin aspart  0-5 Units Subcutaneous QHS  . insulin aspart  0-6 Units Subcutaneous TID WC  . memantine  10 mg Oral BID   . mometasone-formoterol  2 puff Inhalation BID  . pantoprazole (PROTONIX) IV  40 mg Intravenous Q12H  . potassium chloride  40 mEq Oral Q3H   Continuous Infusions: . [START ON 10/17/2019] cefTRIAXone (ROCEPHIN)  IV    . metronidazole 500 mg (10/16/19 1204)  .  sodium bicarbonate (isotonic) infusion in sterile water  125 mL/hr at 10/16/19 0110     LOS: 3 days    Time spent: 64 minutes    Janyah Singleterry, MD [facility] Pager [phone]  If 7PM-7AM, please contact night-coverage [email or web address] Password [facility] 10/16/2019, 2:47 PM

## 2019-10-17 DIAGNOSIS — R7881 Bacteremia: Secondary | ICD-10-CM

## 2019-10-17 LAB — CBC
HCT: 33.3 % — ABNORMAL LOW (ref 36.0–46.0)
Hemoglobin: 10.8 g/dL — ABNORMAL LOW (ref 12.0–15.0)
MCH: 31.9 pg (ref 26.0–34.0)
MCHC: 32.4 g/dL (ref 30.0–36.0)
MCV: 98.2 fL (ref 80.0–100.0)
Platelets: UNDETERMINED 10*3/uL (ref 150–400)
RBC: 3.39 MIL/uL — ABNORMAL LOW (ref 3.87–5.11)
RDW: 12.3 % (ref 11.5–15.5)
WBC: 7 10*3/uL (ref 4.0–10.5)
nRBC: 0 % (ref 0.0–0.2)

## 2019-10-17 LAB — BASIC METABOLIC PANEL
Anion gap: 14 (ref 5–15)
BUN: 33 mg/dL — ABNORMAL HIGH (ref 8–23)
CO2: 45 mmol/L — ABNORMAL HIGH (ref 22–32)
Calcium: 7 mg/dL — ABNORMAL LOW (ref 8.9–10.3)
Chloride: 91 mmol/L — ABNORMAL LOW (ref 98–111)
Creatinine, Ser: 1.28 mg/dL — ABNORMAL HIGH (ref 0.44–1.00)
GFR calc Af Amer: 47 mL/min — ABNORMAL LOW (ref >60)
GFR calc non Af Amer: 40 mL/min — ABNORMAL LOW (ref >60)
Glucose, Bld: 186 mg/dL — ABNORMAL HIGH (ref 70–99)
Potassium: 3.1 mmol/L — ABNORMAL LOW (ref 3.5–5.1)
Sodium: 150 mmol/L — ABNORMAL HIGH (ref 135–145)

## 2019-10-17 LAB — URINALYSIS, ROUTINE W REFLEX MICROSCOPIC
Bilirubin Urine: NEGATIVE
Glucose, UA: NEGATIVE mg/dL
Hgb urine dipstick: NEGATIVE
Ketones, ur: 5 mg/dL — AB
Leukocytes,Ua: NEGATIVE
Nitrite: NEGATIVE
Protein, ur: 100 mg/dL — AB
Specific Gravity, Urine: 1.01 (ref 1.005–1.030)
pH: 9 — ABNORMAL HIGH (ref 5.0–8.0)

## 2019-10-17 LAB — GLUCOSE, CAPILLARY
Glucose-Capillary: 151 mg/dL — ABNORMAL HIGH (ref 70–99)
Glucose-Capillary: 172 mg/dL — ABNORMAL HIGH (ref 70–99)
Glucose-Capillary: 188 mg/dL — ABNORMAL HIGH (ref 70–99)
Glucose-Capillary: 243 mg/dL — ABNORMAL HIGH (ref 70–99)

## 2019-10-17 LAB — MRSA PCR SCREENING: MRSA by PCR: NEGATIVE

## 2019-10-17 MED ORDER — POTASSIUM CL IN DEXTROSE 5% 20 MEQ/L IV SOLN
20.0000 meq | INTRAVENOUS | Status: DC
  Administered 2019-10-17 – 2019-10-18 (×2): 20 meq via INTRAVENOUS
  Filled 2019-10-17 (×3): qty 1000

## 2019-10-17 MED ORDER — SODIUM CHLORIDE 0.9% FLUSH: 10.0000 mL | INTRAVENOUS | Status: DC | PRN

## 2019-10-17 MED ORDER — SODIUM CHLORIDE 0.9% FLUSH
10.0000 mL | Freq: Two times a day (BID) | INTRAVENOUS | Status: DC
  Administered 2019-10-17 – 2019-10-21 (×3): 10 mL

## 2019-10-17 NOTE — NC FL2 (Addendum)
MEDICAID FL2 LEVEL OF CARE SCREENING TOOL     IDENTIFICATION  Patient Name: [name] Birthdate: June 27, 1941 Sex: female Admission Date (Current Location): 10/13/2019  [facility] and [place] Number:  Herbalist and Address:  [facility], [place] [address] [place], [place] [address]      Provider Number: [id number]  Attending Physician Name and Address:  Monica Becton, MD  Relative Name and Phone Number:  Benjamine Mola, daughter, [phone]    Current Level of Care: Hospital Recommended Level of Care: [place] Prior Approval Number:    Date Approved/Denied:   PASRR Number: [id number]  Discharge Plan: SNF    Current Diagnoses: Patient Active Problem List   Diagnosis Date Noted  . Acute renal failure (ARF) ([place]) 10/14/2019  . Metabolic acidosis [id number]  . Acute metabolic encephalopathy Q000111Q  . Viral gastroenteritis 10/14/2019  . AKI (acute kidney injury) ([place]) 10/14/2019  . Hypertension associated with type 2 diabetes mellitus ([place]) 03/06/2019  . Dyslipidemia associated with type 2 diabetes mellitus ([place]) 03/06/2019  . Acquired hypothyroidism 03/06/2019  . Vascular dementia without behavioral disturbance ([place]) 03/06/2019  . Insomnia 08/06/2018  . GERD (gastroesophageal reflux disease) 08/04/2017  . Dysphagia 05/10/2017  . Depression 03/14/2017  . Vitamin D deficiency 01/28/2017  . UTI due to extended-spectrum beta lactamase (ESBL) producing Escherichia coli 11/18/2016  . Urinary tract infection due to Proteus 11/18/2016  . Postoperative anemia due to acute blood loss 11/18/2016  . HTN (hypertension) 11/18/2016  . Closed right hip fracture ([place]) 11/10/2016  . Lactic acidosis 01/02/2016  . Senile dementia without behavioral disturbance ([place]) 10/06/2015  . Obesity (BMI 30-39.9) 10/06/2015  . Tobacco use disorder 03/17/2015  . Cardiomyopathy, ischemic 03/17/2015  . Allergy to ACE inhibitors  03/17/2015  . Ventricular tachycardia ([place]) 03/17/2015  . HLD (hyperlipidemia) 12/31/2014  . COPD (chronic obstructive pulmonary disease) ([place]) 12/31/2014  . Chronic cough 09/12/2013  . Memory deficit 04/23/2013  . Essential hypertension, benign 04/03/2013  . Dyslipidemia 04/03/2013  . Coronary atherosclerosis of native coronary artery 04/03/2013  . DM2 (diabetes mellitus, type 2) ([place]) 04/02/2013  . Fibromyalgia 04/20/2008    Orientation RESPIRATION BLADDER Height & Weight     (Unable to assess, responds to voice)  Normal Incontinent Weight: 126 lb 12.8 oz (57.5 kg) Height:  5\' 4"  (162.6 cm)  BEHAVIORAL SYMPTOMS/MOOD NEUROLOGICAL BOWEL NUTRITION STATUS      Incontinent Diet(Carb modified)  AMBULATORY STATUS COMMUNICATION OF NEEDS Skin   Extensive Assist Verbally Bruising(Bruising to bilateral arms/ MASD to groins and sacrum)                       Personal Care Assistance Level of Assistance  Bathing, Feeding, Dressing Bathing Assistance: Maximum assistance Feeding assistance: Maximum assistance Dressing Assistance: Maximum assistance     Functional Limitations Info  Sight, Hearing, Speech Sight Info: Impaired Hearing Info: Adequate Speech Info: Impaired    SPECIAL CARE FACTORS FREQUENCY  PT (By licensed PT), OT (By licensed OT), Speech therapy  PT Frequency: 2x/week OT Frequency: 2x/week Speech Frequency: 2x/week      Contractures Contractures Info: Not present    Additional Factors Info  Code Status, Allergies, Psychotropic, Insulin Sliding Scale Code Status Info: DNR Allergies Info: Lisinopril Spironolactone Vancomycin Donepezil Eggs Or Egg-derived Products Erythromycin Macrolides And Ketolides Penicillins Quinapril Hcl Angiotensin Receptor Blockers Lipitor Pentazocine Lactate Propoxyphene Hcl Sulfonamide Derivatives Psychotropic Info: Namenda 10 mg Twice a day Insulin Sliding Scale Info: Novolog 0-6  units SQ three times a day/ Novolog 0-5 units at bedtime        Current Medications (10/17/2019):  This is the current hospital active medication list Current Facility-Administered Medications  Medication Dose Route Frequency Provider Last Rate Last Dose  . acetaminophen (TYLENOL) tablet 650 mg  650 mg Oral Q6H PRN Shela Leff, MD       Or  . acetaminophen (TYLENOL) suppository 650 mg  650 mg Rectal Q6H PRN Shela Leff, MD      . atorvastatin (LIPITOR) tablet 40 mg  40 mg Oral Daily Shela Leff, MD   40 mg at 10/16/19 1015  . cefTRIAXone (ROCEPHIN) 2 g in sodium chloride 0.9 % 100 mL IVPB  2 g Intravenous Q24H Vasireddy, Grier Mitts, MD      . cholecalciferol (VITAMIN D3) tablet 1,000 Units  1,000 Units Oral Daily Shela Leff, MD   1,000 Units at 10/16/19 1015  . feeding supplement (ENSURE ENLIVE) (ENSURE ENLIVE) liquid 237 mL  237 mL Oral BID BM Barton Dubois, MD   237 mL at 10/16/19 1016  . insulin aspart (novoLOG) injection 0-5 Units  0-5 Units Subcutaneous QHS Shela Leff, MD   2 Units at 10/15/19 2219  . insulin aspart (novoLOG) injection 0-6 Units  0-6 Units Subcutaneous TID WC Shela Leff, MD   1 Units at 10/17/19 [phone]  . memantine (NAMENDA) tablet 10 mg  10 mg Oral BID Shela Leff, MD   10 mg at 10/16/19 2233  . metroNIDAZOLE (FLAGYL) IVPB 500 mg  500 mg Intravenous Q8H Vasireddy, Grier Mitts, MD 100 mL/hr at 10/17/19 0208 500 mg at 10/17/19 0208  . mometasone-formoterol (DULERA) 100-5 MCG/ACT inhaler 2 puff  2 puff Inhalation BID Shela Leff, MD   2 puff at 10/16/19 2251  . pantoprazole (PROTONIX) injection 40 mg  40 mg Intravenous Q12H Shela Leff, MD   40 mg at 10/16/19 2233  . sodium bicarbonate 150 mEq in sterile water 1,000 mL infusion   Intravenous Continuous Shela Leff, MD 125 mL/hr at 10/17/19 0207       Discharge Medications: Please see discharge summary for a list of discharge medications.  Relevant Imaging Results:  Relevant Lab Results:   Additional  Information SSN: [id number]      COVID negative 11/23  Benard Halsted, LCSW

## 2019-10-17 NOTE — Progress Notes (Signed)
PROGRESS NOTE    [name]  [id number] DOB: Dec 23, 1940 DOA: 10/13/2019 PCP: Hennie Duos, MD    Brief Narrative: 78 year old female with history of Alzheimer's dementia, CAD s/p PCI, chronic diastolic congestive heart failure, COPD, insulin-dependent diabetes mellitus, hypertension, hyperlipidemia was brought to the emergency department for lethargy, altered mental status.  Nursing facility reported that patient had vomiting and diarrhea started on Friday.  Patient came to the emergency department was found to be hypoglycemic.  Patient also had one single episode of coffee-ground emesis 3 days ago.  Patient is DO NOT RESUSCITATE.  Work-up in the emergency department patient is found to have WBC count of 25,000, lactic acid 6.7, BUN 106, creatinine 7.2.  Patient's baseline creatinine is 0.8.  Patient is also found to have bicarb of 10, potassium 5.1, anion gap of 27.  COVID-19 came back to be negative.  Blood cultures were obtained.  Chest x-ray did not show any infiltrates.  CT head without contrast showed no acute intracranial abnormality.  CT abdomen and pelvis, CT chest without any abnormalities.  Assessment & Plan:   Principal Problem:   Acute renal failure (ARF) (HCC) Active Problems:   Lactic acidosis   Metabolic acidosis   Acute metabolic encephalopathy   Viral gastroenteritis   AKI (acute kidney injury) ([place])   Assessment & Plan: ##Acute renal failure (ARF) (HCC) -Appears to be prerenal, cannot exclude ATN  -avoid nephrotoxic agents -Continue aggressive fluid resuscitation -Follow-up renal function trend 6.57->5.18->2.31 -Renal ultrasound demonstrating no acute hydronephrosis or obstructive uropathy. -Nephrology is consulted  ##Severe sepsis from Salmonella bacteremia, gastroenteritis -Admitted with elevated lactic acid of 7, elevated white blood cell count of 25,000, tachycardia -Most likely from gastroenteritis -Keep the patient on Rocephin, Flagyl  -Follow-up with blood cultures -Check C. difficile toxin-negative -WBC normalized  ##acute metabolic encephalopathy -Secondary to uremia and electrolyte imbalances, sepsis -Patient with underlying  Advanced dementia -Hold any sedating medications -More alert, tolerating diet  Salmonella bacteremia -blood cultures x2 positive for Salmonella -continue with Rocephin  ##Acute gastroenteritis - on Rocephin, discontinue Flagyl -Get C. difficile toxin-negative -Continue with IV fluids  ##GI bleed -Patient had one episode of coffee-ground emesis -Continue with IV Protonix -Closely follow-up with CBC -Hemoglobin stable  ##Hyperkalemia -From metabolic acidosis -Correct the underlying condition and follow-up -normalized  ##Hypokalemia -Potassium is 2.4 today -Replace by mouth. -Patient has normal magnesium level  ##Hypomagnesemia -Replace magnesium by IV -Normalized  ##Severe dehydration -Secondary to decreased p.o. intake, diarrhea -Continue with IV fluids  ##dementia without behavioral disorder -Continue Namenda -Continue supportive care.  ##depression and poor oral intake -Continue Remeron.  ##Diabetes mellitus -Hemoglobin A1c 6.7 -Follow-up on sliding scale insulin    DVT prophylaxis: SCDs  code Status: DNR  family Communication: Updated patient's daughter Ms.  Lenore Manner Disposition Plan: Pending clinical improvement  Consultants:   Nephrology   Antimicrobials: Rocephin 10/15/2019 Flagyl 10/15/2019   subjective: Patient is somnolent.  Opens eyes.  Did not follow any commands. Per nursing staff patient is tolerating diet with assistance.  Objective: Vitals:   10/17/19 0332 10/17/19 0843 10/17/19 1130 10/17/19 1944  BP: 135/72 (!) 147/72 135/75 (!) 133/91  Pulse: 80 87 85 82  Resp: 17 18 18 16   Temp: 98.6 F (37 C) 98.6 F (37 C) 97.8 F (36.6 C) 97.6 F (36.4 C)  TempSrc: Oral Axillary Oral Oral  SpO2: 93% 96% 94% 97%  Weight:       Height:        Intake/Output Summary (  Last 24 hours) at 10/17/2019 2048 Last data filed at 10/17/2019 1948 Gross per 24 hour  Intake 5003.35 ml  Output 600 ml  Net 4403.35 ml   Filed Weights   10/14/19 0335  Weight: 57.5 kg    Examination:  General exam: Appears calm and comfortable.  I did not follow any commands Respiratory system: Clear to auscultation. Respiratory effort normal. Cardiovascular system: S1 & S2 heard, RRR. No JVD, murmurs, rubs, gallops or clicks. No pedal edema. Gastrointestinal system: Abdomen is nondistended, soft and nontender. No organomegaly or masses felt. Normal bowel sounds heard. Central nervous system: Alert and not oriented x3.  Unable to examine sensory, motor Extremities: Symmetric 5 x 5 power. Skin: No rashes, lesions or ulcers Psychiatry: Unable to assess.    Data Reviewed: I have personally reviewed following labs and imaging studies  CBC: Recent Labs  Lab 10/13/19 2010 10/14/19 0528 10/16/19 0340 10/17/19 0247  WBC 25.5* 21.9* 8.3 7.0  NEUTROABS 23.2*  --   --   --   HGB 15.3* 11.8* 10.9* 10.8*  HCT 48.1* 36.8 32.6* 33.3*  MCV 101.5* 98.9 94.2 98.2  PLT 230 212 126* PLATELET CLUMPS NOTED ON SMEAR, UNABLE TO ESTIMATE   Basic Metabolic Panel: Recent Labs  Lab 10/13/19 2117 10/14/19 0528 10/15/19 0601 10/16/19 0727 10/17/19 0247  NA 143 146* 143 148* 150*  K 5.1 4.4 5.0 2.4* 3.1*  CL 106 106 91* 86* 91*  CO2 10* 15* 29 43* 45*  GLUCOSE 189* 128* 89 148* 186*  BUN 106* 104* 103* 57* 33*  CREATININE 7.23* 6.57* 5.18* 2.31* 1.28*  CALCIUM 10.1 8.5* 7.2* 6.9* 7.0*  MG  --  1.7  --  2.5*  --   PHOS  --  7.5*  --   --   --    GFR: Estimated Creatinine Clearance: 31.8 mL/min (A) (by C-G formula based on SCr of 1.28 mg/dL (H)). Liver Function Tests: Recent Labs  Lab 10/13/19 2117 10/14/19 0528  AST 29  --   ALT 16  --   ALKPHOS 105  --   BILITOT 0.6  --   PROT 9.1*  --   ALBUMIN 3.5 2.6*   Recent Labs  Lab  10/13/19 2117  LIPASE 20   No results for input(s): AMMONIA in the last 168 hours. Coagulation Profile: Recent Labs  Lab 10/13/19 2159  INR 1.2   Cardiac Enzymes: No results for input(s): CKTOTAL, CKMB, CKMBINDEX, TROPONINI in the last 168 hours. BNP (last 3 results) No results for input(s): PROBNP in the last 8760 hours. HbA1C: No results for input(s): HGBA1C in the last 72 hours. CBG: Recent Labs  Lab 10/16/19 1637 10/16/19 2140 10/17/19 0613 10/17/19 1151 10/17/19 1708  GLUCAP 243* 176* 151* 172* 188*   Lipid Profile: No results for input(s): CHOL, HDL, LDLCALC, TRIG, CHOLHDL, LDLDIRECT in the last 72 hours. Thyroid Function Tests: No results for input(s): TSH, T4TOTAL, FREET4, T3FREE, THYROIDAB in the last 72 hours. Anemia Panel: No results for input(s): VITAMINB12, FOLATE, FERRITIN, TIBC, IRON, RETICCTPCT in the last 72 hours. Sepsis Labs: Recent Labs  Lab 10/13/19 2010 10/14/19 0528 10/14/19 0820  PROCALCITON  --  1.99  --   LATICACIDVEN 6.7* 5.3* 4.3*    Recent Results (from the past 240 hour(s))  Blood Culture (routine x 2)     Status: Abnormal (Preliminary result)   Collection Time: 10/13/19 10:00 PM   Specimen: BLOOD RIGHT WRIST  Result Value Ref Range Status   Specimen Description BLOOD  RIGHT WRIST  Final   Special Requests   Final    BOTTLES DRAWN AEROBIC AND ANAEROBIC Blood Culture results may not be optimal due to an inadequate volume of blood received in culture bottles   Culture  Setup Time   Final    GRAM NEGATIVE RODS AEROBIC BOTTLE ONLY CRITICAL RESULT CALLED TO, READ BACK BY AND VERIFIED WITH: Hughie Closs PharmD 10:30 10/16/19 ([email or web address]) Performed at [facility], [place] [address] [place], [place] [address]    Culture SALMONELLA ENTERITIDIS (A)  Final   Report Status PENDING  Incomplete  Blood Culture (routine x 2)     Status: Abnormal (Preliminary result)   Collection Time: 10/13/19 10:00 PM   Specimen: BLOOD  Result Value Ref Range  Status   Specimen Description BLOOD LEFT ANKLE  Final   Special Requests   Final    BOTTLES DRAWN AEROBIC ONLY Blood Culture results may not be optimal due to an inadequate volume of blood received in culture bottles   Culture  Setup Time   Final    GRAM NEGATIVE RODS AEROBIC BOTTLE ONLY CRITICAL VALUE NOTED.  VALUE IS CONSISTENT WITH PREVIOUSLY REPORTED AND CALLED VALUE. Performed at [facility], [place] [address], [place], [place] [address]    Culture SALMONELLA ENTERITIDIS (A)  Final   Report Status PENDING  Incomplete  Blood Culture ID Panel (Reflexed)     Status: Abnormal   Collection Time: 10/13/19 10:00 PM  Result Value Ref Range Status   Enterococcus species NOT DETECTED NOT DETECTED Final   Listeria monocytogenes NOT DETECTED NOT DETECTED Final   Staphylococcus species NOT DETECTED NOT DETECTED Final   Staphylococcus aureus (BCID) NOT DETECTED NOT DETECTED Final   Streptococcus species NOT DETECTED NOT DETECTED Final   Streptococcus agalactiae NOT DETECTED NOT DETECTED Final   Streptococcus pneumoniae NOT DETECTED NOT DETECTED Final   Streptococcus pyogenes NOT DETECTED NOT DETECTED Final   Acinetobacter baumannii NOT DETECTED NOT DETECTED Final   Enterobacteriaceae species DETECTED (A) NOT DETECTED Final    Comment: Enterobacteriaceae represent a large family of gram negative bacteria, not a single organism. Refer to culture for further identification. CRITICAL RESULT CALLED TO, READ BACK BY AND VERIFIED WITH: Hughie Closs PharmD 10:30 10/16/19 ([email or web address])    Enterobacter cloacae complex NOT DETECTED NOT DETECTED Final   Escherichia coli NOT DETECTED NOT DETECTED Final   Klebsiella oxytoca NOT DETECTED NOT DETECTED Final   Klebsiella pneumoniae NOT DETECTED NOT DETECTED Final   Proteus species NOT DETECTED NOT DETECTED Final   Serratia marcescens NOT DETECTED NOT DETECTED Final   Carbapenem resistance NOT DETECTED NOT DETECTED Final   Haemophilus influenzae NOT DETECTED  NOT DETECTED Final   Neisseria meningitidis NOT DETECTED NOT DETECTED Final   Pseudomonas aeruginosa NOT DETECTED NOT DETECTED Final   Candida albicans NOT DETECTED NOT DETECTED Final   Candida glabrata NOT DETECTED NOT DETECTED Final   Candida krusei NOT DETECTED NOT DETECTED Final   Candida parapsilosis NOT DETECTED NOT DETECTED Final   Candida tropicalis NOT DETECTED NOT DETECTED Final    Comment: Performed at [facility], [place] [address], [place], [place] [address]  SARS CORONAVIRUS 2 (TAT 6-24 HRS) Nasopharyngeal Nasopharyngeal Swab     Status: None   Collection Time: 10/13/19 10:59 PM   Specimen: Nasopharyngeal Swab  Result Value Ref Range Status   SARS Coronavirus 2 NEGATIVE NEGATIVE Final    Comment: (NOTE) SARS-CoV-2 target nucleic acids are NOT DETECTED. The SARS-CoV-2 RNA  is generally detectable in upper and lower respiratory specimens during the acute phase of infection. Negative results do not preclude SARS-CoV-2 infection, do not rule out co-infections with other pathogens, and should not be used as the sole basis for treatment or other patient management decisions. Negative results must be combined with clinical observations, patient history, and epidemiological information. The expected result is Negative. Fact Sheet for Patients: SugarRoll.be Fact Sheet for Healthcare Providers: https://www.woods-mathews.com/ This test is not yet approved or cleared by the [place] FDA and  has been authorized for detection and/or diagnosis of SARS-CoV-2 by FDA under an Emergency Use Authorization (EUA). This EUA will remain  in effect (meaning this test can be used) for the duration of the COVID-19 declaration under Section 56 4(b)(1) of the Act, 21 U.S.C. section 360bbb-3(b)(1), unless the authorization is terminated or revoked sooner. Performed at [facility], [place] [address], [place], [place] [address]    Gastrointestinal Panel by PCR , Stool     Status: Abnormal   Collection Time: 10/14/19  4:01 AM   Specimen: Stool  Result Value Ref Range Status   Campylobacter species NOT DETECTED NOT DETECTED Final   Plesimonas shigelloides NOT DETECTED NOT DETECTED Final   Salmonella species DETECTED (A) NOT DETECTED Final    Comment: RESULT CALLED TO, READ BACK BY AND VERIFIED WITH: HEATHER HUNTER 10/15/19 1339 KLW    Yersinia enterocolitica NOT DETECTED NOT DETECTED Final   Vibrio species NOT DETECTED NOT DETECTED Final   Vibrio cholerae NOT DETECTED NOT DETECTED Final   Enteroaggregative E coli (EAEC) NOT DETECTED NOT DETECTED Final   Enteropathogenic E coli (EPEC) NOT DETECTED NOT DETECTED Final   Enterotoxigenic E coli (ETEC) NOT DETECTED NOT DETECTED Final   Shiga like toxin producing E coli (STEC) NOT DETECTED NOT DETECTED Final   Shigella/Enteroinvasive E coli (EIEC) NOT DETECTED NOT DETECTED Final   Cryptosporidium NOT DETECTED NOT DETECTED Final   Cyclospora cayetanensis NOT DETECTED NOT DETECTED Final   Entamoeba histolytica NOT DETECTED NOT DETECTED Final   Giardia lamblia NOT DETECTED NOT DETECTED Final   Adenovirus F40/41 NOT DETECTED NOT DETECTED Final   Astrovirus NOT DETECTED NOT DETECTED Final   Norovirus GI/GII NOT DETECTED NOT DETECTED Final   Rotavirus A NOT DETECTED NOT DETECTED Final   Sapovirus (I, II, IV, and V) NOT DETECTED NOT DETECTED Final    Comment: Performed at [facility], [address] [place], [place] [address]  C difficile quick scan w PCR reflex     Status: None   Collection Time: 10/14/19  4:01 AM   Specimen: Stool  Result Value Ref Range Status   C Diff antigen NEGATIVE NEGATIVE Final   C Diff toxin NEGATIVE NEGATIVE Final   C Diff interpretation No C. difficile detected.  Final    Comment: Performed at [facility], [place] [address] [place], [place] [address]  MRSA PCR Screening     Status: None   Collection Time: 10/17/19 11:47 AM    Specimen: Nasal Mucosa; Nasopharyngeal  Result Value Ref Range Status   MRSA by PCR NEGATIVE NEGATIVE Final    Comment:        The GeneXpert MRSA Assay (FDA approved for NASAL specimens only), is one component of a comprehensive MRSA colonization surveillance program. It is not intended to diagnose MRSA infection nor to guide or monitor treatment for MRSA infections. Performed at [facility], [place] [address], [place], [place] [address]  Radiology Studies: No results found.      Scheduled Meds: . atorvastatin  40 mg Oral Daily  . cholecalciferol  1,000 Units Oral Daily  . feeding supplement (ENSURE ENLIVE)  237 mL Oral BID BM  . insulin aspart  0-5 Units Subcutaneous QHS  . insulin aspart  0-6 Units Subcutaneous TID WC  . memantine  10 mg Oral BID  . mometasone-formoterol  2 puff Inhalation BID  . pantoprazole (PROTONIX) IV  40 mg Intravenous Q12H  . sodium chloride flush  10-40 mL Intracatheter Q12H   Continuous Infusions: . cefTRIAXone (ROCEPHIN)  IV 2 g (10/17/19 1107)  . dextrose 5 % with KCl 20 mEq / L 20 mEq (10/17/19 1100)  .  sodium bicarbonate (isotonic) infusion in sterile water Stopped (10/17/19 1100)     LOS: 4 days    Time spent: 58 minutes    Fabion Gatson, MD [facility] Pager [phone]  If 7PM-7AM, please contact night-coverage [email or web address] Password [facility] 10/17/2019, 8:48 PM

## 2019-10-17 NOTE — Care Management Important Message (Signed)
Important Message  Patient Details  Name: BRITTNEA STUKEL MRN: [id number] Date of Birth: 08-30-1941   Medicare Important Message Given:  Yes     Shelda Altes 10/17/2019, 2:22 PM

## 2019-10-17 NOTE — Progress Notes (Signed)
Physical Therapy Treatment Patient Details Name: [name] MRN: [id number] DOB: 25-May-1941 Today's Date: 10/17/2019    History of Present Illness 78yo female presenting from SNF with lethargy and AMS, recent history of vomiting and diarrhea. CT negative for actue intracranial changes. Admitted due to ARF, severe metabolic acidosis, possible viral gastroenteritis. PMH advanced Alzheimers/dementia, CAD, cardiomyopathy, CHF, COPD, DM, fibromyalgia, HTN, HLD, cardiac cath, THA    PT Comments    Pt supine in bed with eyes closed.  Pt does not respond to her name but did not resist mobilizing to a seated position.  Pt sat edge of bed x 6 min with emphasis on maintaining midline and weight shifting to correct LOB.  Pt fatigues quickly and returned to bed.  She appeared wet and rolling performed for pericare and removal of soiled pads.  Pt left sitting in chair position in bed.  Continued to recommend return to SNF.     Follow Up Recommendations  SNF;Supervision/Assistance - 24 hour     Equipment Recommendations  None recommended by PT    Recommendations for Other Services       Precautions / Restrictions Precautions Precautions: Fall;Other (comment) Precaution Comments: advanced dementia Restrictions Weight Bearing Restrictions: No    Mobility  Bed Mobility Overal bed mobility: Needs Assistance Bed Mobility: Supine to Sit;Sit to Supine     Supine to sit: Total assist Sit to supine: Mod assist   General bed mobility comments: Total assistance to come to sitting, When returning to bed she required assistance to lower her trunk but was able to lift her legs back to bed.  PTA assisted in boosting patient to [facility] post session.  Transfers                    Ambulation/Gait                 Stairs             Wheelchair Mobility    Modified Rankin (Stroke Patients Only)       Balance Overall balance assessment: Needs assistance   Sitting  balance-Leahy Scale: Poor Sitting balance - Comments: Lateral lean to R, assistance for weight shifting forward and back to midline.  Pt sat edge of bed x 6 min with flexed upper trunk and head. Postural control: Posterior lean;Right lateral lean                                  Cognition Arousal/Alertness: Awake/alert Behavior During Therapy: Flat affect Overall Cognitive Status: History of cognitive impairments - at baseline                                 General Comments: advanced dementia, reports, "help me" repeatedly in a calm voice.  She did follow commands to reach for therapists hands.      Exercises      General Comments        Pertinent Vitals/Pain Pain Assessment: Faces Pain Score: 0-No pain    Home Living                      Prior Function            PT Goals (current goals can now be found in the care plan section) Acute Rehab PT Goals PT Goal Formulation: Patient unable to  participate in goal setting Potential to Achieve Goals: Fair    Frequency    Min 1X/week      PT Plan Current plan remains appropriate    Co-evaluation              AM-PAC PT "6 Clicks" Mobility   Outcome Measure  Help needed turning from your back to your side while in a flat bed without using bedrails?: Total Help needed moving from lying on your back to sitting on the side of a flat bed without using bedrails?: Total Help needed moving to and from a bed to a chair (including a wheelchair)?: Total Help needed standing up from a chair using your arms (e.g., wheelchair or bedside chair)?: Total Help needed to walk in hospital room?: Total Help needed climbing 3-5 steps with a railing? : Total 6 Click Score: 6    End of Session   Activity Tolerance: Patient limited by fatigue Patient left: in bed;with call bell/phone within reach;with bed alarm set(placed in chair position.) Nurse Communication: Mobility status(need for  purewick and reporting changing soiled pads.) PT Visit Diagnosis: Muscle weakness (generalized) (M62.81);Other abnormalities of gait and mobility (R26.89)     Time: [id number] PT Time Calculation (min) (ACUTE ONLY): 23 min  Charges:  $Therapeutic Activity: 23-37 mins                     Erasmo Leventhal , PTA Acute Rehabilitation Services Pager [phone] Office [phone]     Carlas Vandyne Eli Hose 10/17/2019, 6:05 PM

## 2019-10-17 NOTE — TOC Progression Note (Signed)
Transition of Care [facility]) - Progression Note    Patient Details  Name: [name] MRN: [id number] Date of Birth: [birth year]  Transition of Care [facility]) CM/SW Mobile, LCSW Phone Number: 10/17/2019, 9:37 AM  Clinical Narrative:    CSW continuing to follow for discharge planning needs. [employer] updated.    Expected Discharge Plan: [place] Barriers to Discharge: Continued Medical Work up, Ship broker  Expected Discharge Plan and Services Expected Discharge Plan: [place] Choice: [place] arrangements for the past 2 months: [name]                                       Social Determinants of Health (SDOH) Interventions    Readmission Risk Interventions No flowsheet data found.

## 2019-10-18 LAB — CBC
HCT: 35.2 % — ABNORMAL LOW (ref 36.0–46.0)
Hemoglobin: 11.4 g/dL — ABNORMAL LOW (ref 12.0–15.0)
MCH: 32 pg (ref 26.0–34.0)
MCHC: 32.4 g/dL (ref 30.0–36.0)
MCV: 98.9 fL (ref 80.0–100.0)
Platelets: 145 10*3/uL — ABNORMAL LOW (ref 150–400)
RBC: 3.56 MIL/uL — ABNORMAL LOW (ref 3.87–5.11)
RDW: 12.3 % (ref 11.5–15.5)
WBC: 14.7 10*3/uL — ABNORMAL HIGH (ref 4.0–10.5)
nRBC: 0 % (ref 0.0–0.2)

## 2019-10-18 LAB — GLUCOSE, CAPILLARY
Glucose-Capillary: 208 mg/dL — ABNORMAL HIGH (ref 70–99)
Glucose-Capillary: 198 mg/dL — ABNORMAL HIGH (ref 70–99)
Glucose-Capillary: 167 mg/dL — ABNORMAL HIGH (ref 70–99)
Glucose-Capillary: 157 mg/dL — ABNORMAL HIGH (ref 70–99)

## 2019-10-18 LAB — BASIC METABOLIC PANEL
Anion gap: 13 (ref 5–15)
BUN: 13 mg/dL (ref 8–23)
CO2: 33 mmol/L — ABNORMAL HIGH (ref 22–32)
Calcium: 7.1 mg/dL — ABNORMAL LOW (ref 8.9–10.3)
Chloride: 92 mmol/L — ABNORMAL LOW (ref 98–111)
Creatinine, Ser: 0.94 mg/dL (ref 0.44–1.00)
GFR calc Af Amer: >60 mL/min (ref >60)
GFR calc non Af Amer: 59 mL/min — ABNORMAL LOW (ref >60)
Glucose, Bld: 254 mg/dL — ABNORMAL HIGH (ref 70–99)
Potassium: 3.1 mmol/L — ABNORMAL LOW (ref 3.5–5.1)
Sodium: 138 mmol/L (ref 135–145)

## 2019-10-18 LAB — URINE CULTURE: Culture: <10000 — AB

## 2019-10-18 LAB — MAGNESIUM: Magnesium: 1.2 mg/dL — ABNORMAL LOW (ref 1.7–2.4)

## 2019-10-18 MED ORDER — POTASSIUM CHLORIDE CRYS ER 20 MEQ PO TBCR
20.0000 meq | EXTENDED_RELEASE_TABLET | Freq: Once | ORAL | Status: AC
  Administered 2019-10-18: 20 meq via ORAL
  Filled 2019-10-18: qty 1

## 2019-10-18 MED ORDER — MAGNESIUM SULFATE 4 GM/100ML IV SOLN
4.0000 g | Freq: Once | INTRAVENOUS | Status: AC
  Administered 2019-10-18: 4 g via INTRAVENOUS
  Filled 2019-10-18: qty 100

## 2019-10-18 MED ORDER — PANTOPRAZOLE SODIUM 40 MG PO TBEC
40.0000 mg | DELAYED_RELEASE_TABLET | Freq: Two times a day (BID) | ORAL | Status: DC
  Administered 2019-10-18 – 2019-10-21 (×6): 40 mg via ORAL
  Filled 2019-10-18 (×7): qty 1

## 2019-10-18 NOTE — Progress Notes (Addendum)
PROGRESS NOTE    [name]    Code Status: DNR  [id number] DOB: 12-07-1940 DOA: 10/13/2019  PCP: Hennie Duos, MD    Hospital Summary  78 year old female with history of Alzheimer's dementia, CAD s/p PCI, chronic diastolic congestive heart failure, COPD, insulin-dependent diabetes mellitus, hypertension, hyperlipidemia was brought to the emergency department for lethargy, altered mental status.  Nursing facility reported that patient had vomiting and diarrhea started on Friday.  Patient came to the emergency department was found to be hypoglycemic.  Patient also had one single episode of coffee-ground emesis 3 days ago.  Patient is DO NOT RESUSCITATE.  Work-up in the emergency department patient is found to have WBC count of 25,000, lactic acid 6.7, BUN 106, creatinine 7.2.  Patient's baseline creatinine is 0.8.  Patient is also found to have bicarb of 10, potassium 5.1, anion gap of 27.  COVID-19 came back to be negative.  Blood cultures were obtained.  Chest x-ray did not show any infiltrates.  CT head without contrast showed no acute intracranial abnormality.  CT abdomen and pelvis, CT chest without any abnormalities.  A & P   Principal Problem:   Acute renal failure (ARF) (HCC) Active Problems:   Lactic acidosis   Metabolic acidosis   Acute metabolic encephalopathy   Viral gastroenteritis   AKI (acute kidney injury) ([place])  ##Acute renal failure  -Appears to be prerenal, cannot exclude ATN, resolved -avoid nephrotoxic agents -Discontinue D5 with KCl and sodium bicarb infusion -Follow-up renal function trend 6.57->5.18->2.31 >> 0.94 -Renal ultrasound demonstrating no acute hydronephrosis or obstructive uropathy. -Continue to monitor  ##Severe sepsis from Salmonella bacteremia, gastroenteritis -Admitted with elevated lactic acid of 7, elevated white blood cell count of 25,000, tachycardia -Afebrile and hemodynamically stable, WBC initially normalized but increased  today 7.0> 14.7 today -Most likely from gastroenteritis -Keep the patient on Rocephin -Flagyl discontinued 11/27 -Follow-up with blood cultures -C. difficile toxin negative -Continue antibiotics and follow-up in a.m.  ##acute metabolic encephalopathy -Secondary to uremia and electrolyte imbalances, sepsis -Patient with underlying  Advanced dementia -Hold any sedating medications -Alert and tolerating diet but still not responding to questioning -Replete electrolytes and follow-up in a.m.  Salmonella bacteremia -blood cultures x2 positive for Salmonella -continue with Rocephin  ##Acute gastroenteritis - on Rocephin, discontinue Flagyl -Get C. difficile toxin-negative -Continue with IV fluids  ##GI bleed -Anemia has improved, hemoglobin 11.4 -Patient had one episode of coffee-ground emesis -Changed to p.o. Protonix twice daily -Closely follow-up with CBC -Hemoglobin stable  ##Hyperkalemia -From metabolic acidosis -Correct the underlying condition and follow-up -Now hypokalemic  ##Hypokalemia -Potassium is 2.4>3.1 today -Replace   ##Hypomagnesemia -Replace magnesium by IV  ##Severe dehydration -Secondary to decreased p.o. intake, diarrhea -Appears improved -Discontinue fluids for now  ##dementia without behavioral disorder -Continue Namenda -Continue supportive care.  ##depression and poor oral intake -Continue Remeron.  ##Diabetes mellitus -Hemoglobin A1c 6.7 -Follow-up on sliding scale insulin   DVT prophylaxis: SCDs Diet: Carb modified Family Communication: Discussed with daughter over the phone. Disposition Plan: Clinical improvement  Consultants  Nephrology  Procedures  None  Antibiotics   Anti-infectives (From admission, onward)   Start     Dose/Rate Route Frequency Ordered Stop   10/17/19 1000  cefTRIAXone (ROCEPHIN) 2 g in sodium chloride 0.9 % 100 mL IVPB     2 g 200 mL/hr over 30 Minutes Intravenous Every 24 hours 10/16/19  1031     10/15/19 1000  cefTRIAXone (ROCEPHIN) 1 g in sodium chloride 0.9 %  100 mL IVPB  Status:  Discontinued     1 g 200 mL/hr over 30 Minutes Intravenous Every 24 hours 10/15/19 0957 10/16/19 1031   10/15/19 1000  metroNIDAZOLE (FLAGYL) IVPB 500 mg  Status:  Discontinued     500 mg 100 mL/hr over 60 Minutes Intravenous Every 8 hours 10/15/19 0957 10/17/19 1636   10/14/19 0030  meropenem (MERREM) 500 mg in sodium chloride 0.9 % 100 mL IVPB  Status:  Discontinued     500 mg 200 mL/hr over 30 Minutes Intravenous Daily at bedtime 10/13/19 2354 10/14/19 1753   10/13/19 2300  cefTRIAXone (ROCEPHIN) 2 g in sodium chloride 0.9 % 100 mL IVPB  Status:  Discontinued     2 g 200 mL/hr over 30 Minutes Intravenous Every 24 hours 10/13/19 2238 10/13/19 2344   10/13/19 2130  aztreonam (AZACTAM) 2 g in sodium chloride 0.9 % 100 mL IVPB  Status:  Discontinued     2 g 200 mL/hr over 30 Minutes Intravenous  Once 10/13/19 2116 10/13/19 2137   10/13/19 2130  metroNIDAZOLE (FLAGYL) IVPB 500 mg     500 mg 100 mL/hr over 60 Minutes Intravenous  Once 10/13/19 2116 10/14/19 0023           Subjective   Patient seen and examined.  Awake at bedside.  Not responding to questioning and somnolent.  Unable to provide history  Objective   Vitals:   10/18/19 0338 10/18/19 0846 10/18/19 1040 10/18/19 1147  BP: (!) 148/74 127/82  131/73  Pulse: 88 (!) 105 88 88  Resp: 18 18  18   Temp: 98 F (36.7 C) 98.6 F (37 C)  98.8 F (37.1 C)  TempSrc: Oral Axillary  Axillary  SpO2: 100% 100%  95%  Weight:      Height:        Intake/Output Summary (Last 24 hours) at 10/18/2019 1534 Last data filed at 10/18/2019 0800 Gross per 24 hour  Intake 1568.79 ml  Output 1100 ml  Net 468.79 ml   Filed Weights   10/14/19 0335  Weight: 57.5 kg    Examination:  Physical Exam Constitutional:      Comments: Awake but somnolent  not responding to questioning or commands  HENT:     Head: Normocephalic.      Mouth/Throat:     Mouth: Mucous membranes are moist.  Cardiovascular:     Rate and Rhythm: Normal rate and regular rhythm.  Pulmonary:     Effort: Pulmonary effort is normal.     Breath sounds: Normal breath sounds.  Abdominal:     General: Abdomen is flat.     Palpations: Abdomen is soft.  Musculoskeletal:     Right lower leg: No edema.     Left lower leg: No edema.  Skin:    General: Skin is warm.  Neurological:     Mental Status: She is disoriented.     Data Reviewed: I have personally reviewed following labs and imaging studies  CBC: Recent Labs  Lab 10/13/19 2010 10/14/19 0528 10/16/19 0340 10/17/19 0247 10/18/19 1007  WBC 25.5* 21.9* 8.3 7.0 14.7*  NEUTROABS 23.2*  --   --   --   --   HGB 15.3* 11.8* 10.9* 10.8* 11.4*  HCT 48.1* 36.8 32.6* 33.3* 35.2*  MCV 101.5* 98.9 94.2 98.2 98.9  PLT 230 212 126* PLATELET CLUMPS NOTED ON SMEAR, UNABLE TO ESTIMATE [id number]*   Basic Metabolic Panel: Recent Labs  Lab 10/14/19 0528 10/15/19 0601 10/16/19 [id number]  10/17/19 0247 10/18/19 0330  NA 146* 143 148* 150* 138  K 4.4 5.0 2.4* 3.1* 3.1*  CL 106 91* 86* 91* 92*  CO2 15* 29 43* 45* 33*  GLUCOSE 128* 89 148* 186* 254*  BUN 104* 103* 57* 33* 13  CREATININE 6.57* 5.18* 2.31* 1.28* 0.94  CALCIUM 8.5* 7.2* 6.9* 7.0* 7.1*  MG 1.7  --  2.5*  --  1.2*  PHOS 7.5*  --   --   --   --    GFR: Estimated Creatinine Clearance: 43.3 mL/min (by C-G formula based on SCr of 0.94 mg/dL). Liver Function Tests: Recent Labs  Lab 10/13/19 2117 10/14/19 0528  AST 29  --   ALT 16  --   ALKPHOS 105  --   BILITOT 0.6  --   PROT 9.1*  --   ALBUMIN 3.5 2.6*   Recent Labs  Lab 10/13/19 2117  LIPASE 20   No results for input(s): AMMONIA in the last 168 hours. Coagulation Profile: Recent Labs  Lab 10/13/19 2159  INR 1.2   Cardiac Enzymes: No results for input(s): CKTOTAL, CKMB, CKMBINDEX, TROPONINI in the last 168 hours. BNP (last 3 results) No results for input(s): PROBNP in the  last 8760 hours. HbA1C: No results for input(s): HGBA1C in the last 72 hours. CBG: Recent Labs  Lab 10/17/19 1151 10/17/19 1708 10/17/19 2128 10/18/19 0605 10/18/19 1153  GLUCAP 172* 188* 243* 208* 198*   Lipid Profile: No results for input(s): CHOL, HDL, LDLCALC, TRIG, CHOLHDL, LDLDIRECT in the last 72 hours. Thyroid Function Tests: No results for input(s): TSH, T4TOTAL, FREET4, T3FREE, THYROIDAB in the last 72 hours. Anemia Panel: No results for input(s): VITAMINB12, FOLATE, FERRITIN, TIBC, IRON, RETICCTPCT in the last 72 hours. Sepsis Labs: Recent Labs  Lab 10/13/19 2010 10/14/19 0528 10/14/19 0820  PROCALCITON  --  1.99  --   LATICACIDVEN 6.7* 5.3* 4.3*    Recent Results (from the past 240 hour(s))  Blood Culture (routine x 2)     Status: Abnormal (Preliminary result)   Collection Time: 10/13/19 10:00 PM   Specimen: BLOOD RIGHT WRIST  Result Value Ref Range Status   Specimen Description BLOOD RIGHT WRIST  Final   Special Requests   Final    BOTTLES DRAWN AEROBIC AND ANAEROBIC Blood Culture results may not be optimal due to an inadequate volume of blood received in culture bottles   Culture  Setup Time   Final    GRAM NEGATIVE RODS AEROBIC BOTTLE ONLY CRITICAL RESULT CALLED TO, READ BACK BY AND VERIFIED WITH: Hughie Closs PharmD 10:30 10/16/19 ([email or web address])    Culture (A)  Final    SALMONELLA ENTERITIDIS CULTURE REINCUBATED FOR BETTER GROWTH Performed at [facility], [address], [place], [place] [address]    Report Status PENDING  Incomplete   Organism ID, Bacteria SALMONELLA ENTERITIDIS  Final      Susceptibility   Salmonella enteritidis - MIC*    AMPICILLIN <=2 SENSITIVE Sensitive     LEVOFLOXACIN <=0.12 SENSITIVE Sensitive     TRIMETH/SULFA <=20 SENSITIVE Sensitive     * SALMONELLA ENTERITIDIS  Blood Culture (routine x 2)     Status: Abnormal (Preliminary result)   Collection Time: 10/13/19 10:00 PM   Specimen: BLOOD  Result Value Ref Range Status     Specimen Description BLOOD LEFT ANKLE  Final   Special Requests   Final    BOTTLES DRAWN AEROBIC ONLY Blood Culture results may not be optimal due to an  inadequate volume of blood received in culture bottles   Culture  Setup Time   Final    GRAM NEGATIVE RODS AEROBIC BOTTLE ONLY CRITICAL VALUE NOTED.  VALUE IS CONSISTENT WITH PREVIOUSLY REPORTED AND CALLED VALUE.    Culture (A)  Final    SALMONELLA ENTERITIDIS CULTURE REINCUBATED FOR BETTER GROWTH Performed at [facility], [place] [address], [place], [place] [address]    Report Status PENDING  Incomplete  Blood Culture ID Panel (Reflexed)     Status: Abnormal   Collection Time: 10/13/19 10:00 PM  Result Value Ref Range Status   Enterococcus species NOT DETECTED NOT DETECTED Final   Listeria monocytogenes NOT DETECTED NOT DETECTED Final   Staphylococcus species NOT DETECTED NOT DETECTED Final   Staphylococcus aureus (BCID) NOT DETECTED NOT DETECTED Final   Streptococcus species NOT DETECTED NOT DETECTED Final   Streptococcus agalactiae NOT DETECTED NOT DETECTED Final   Streptococcus pneumoniae NOT DETECTED NOT DETECTED Final   Streptococcus pyogenes NOT DETECTED NOT DETECTED Final   Acinetobacter baumannii NOT DETECTED NOT DETECTED Final   Enterobacteriaceae species DETECTED (A) NOT DETECTED Final    Comment: Enterobacteriaceae represent a large family of gram negative bacteria, not a single organism. Refer to culture for further identification. CRITICAL RESULT CALLED TO, READ BACK BY AND VERIFIED WITH: Hughie Closs PharmD 10:30 10/16/19 ([email or web address])    Enterobacter cloacae complex NOT DETECTED NOT DETECTED Final   Escherichia coli NOT DETECTED NOT DETECTED Final   Klebsiella oxytoca NOT DETECTED NOT DETECTED Final   Klebsiella pneumoniae NOT DETECTED NOT DETECTED Final   Proteus species NOT DETECTED NOT DETECTED Final   Serratia marcescens NOT DETECTED NOT DETECTED Final   Carbapenem resistance NOT DETECTED NOT DETECTED Final    Haemophilus influenzae NOT DETECTED NOT DETECTED Final   Neisseria meningitidis NOT DETECTED NOT DETECTED Final   Pseudomonas aeruginosa NOT DETECTED NOT DETECTED Final   Candida albicans NOT DETECTED NOT DETECTED Final   Candida glabrata NOT DETECTED NOT DETECTED Final   Candida krusei NOT DETECTED NOT DETECTED Final   Candida parapsilosis NOT DETECTED NOT DETECTED Final   Candida tropicalis NOT DETECTED NOT DETECTED Final    Comment: Performed at [facility], [place] [address], [place], [place] [address]  SARS CORONAVIRUS 2 (TAT 6-24 HRS) Nasopharyngeal Nasopharyngeal Swab     Status: None   Collection Time: 10/13/19 10:59 PM   Specimen: Nasopharyngeal Swab  Result Value Ref Range Status   SARS Coronavirus 2 NEGATIVE NEGATIVE Final    Comment: (NOTE) SARS-CoV-2 target nucleic acids are NOT DETECTED. The SARS-CoV-2 RNA is generally detectable in upper and lower respiratory specimens during the acute phase of infection. Negative results do not preclude SARS-CoV-2 infection, do not rule out co-infections with other pathogens, and should not be used as the sole basis for treatment or other patient management decisions. Negative results must be combined with clinical observations, patient history, and epidemiological information. The expected result is Negative. Fact Sheet for Patients: SugarRoll.be Fact Sheet for Healthcare Providers: https://www.woods-mathews.com/ This test is not yet approved or cleared by the [place] FDA and  has been authorized for detection and/or diagnosis of SARS-CoV-2 by FDA under an Emergency Use Authorization (EUA). This EUA will remain  in effect (meaning this test can be used) for the duration of the COVID-19 declaration under Section 56 4(b)(1) of the Act, 21 U.S.C. section 360bbb-3(b)(1), unless the authorization is terminated or revoked sooner. Performed at [facility], [place] [address]  [place], [place] [address]   Gastrointestinal Panel by PCR , Stool     Status: Abnormal   Collection Time: 10/14/19  4:01 AM   Specimen: Stool  Result Value Ref Range Status   Campylobacter species NOT DETECTED NOT DETECTED Final   Plesimonas shigelloides NOT DETECTED NOT DETECTED Final   Salmonella species DETECTED (A) NOT DETECTED Final    Comment: RESULT CALLED TO, READ BACK BY AND VERIFIED WITH: HEATHER HUNTER 10/15/19 1339 KLW    Yersinia enterocolitica NOT DETECTED NOT DETECTED Final   Vibrio species NOT DETECTED NOT DETECTED Final   Vibrio cholerae NOT DETECTED NOT DETECTED Final   Enteroaggregative E coli (EAEC) NOT DETECTED NOT DETECTED Final   Enteropathogenic E coli (EPEC) NOT DETECTED NOT DETECTED Final   Enterotoxigenic E coli (ETEC) NOT DETECTED NOT DETECTED Final   Shiga like toxin producing E coli (STEC) NOT DETECTED NOT DETECTED Final   Shigella/Enteroinvasive E coli (EIEC) NOT DETECTED NOT DETECTED Final   Cryptosporidium NOT DETECTED NOT DETECTED Final   Cyclospora cayetanensis NOT DETECTED NOT DETECTED Final   Entamoeba histolytica NOT DETECTED NOT DETECTED Final   Giardia lamblia NOT DETECTED NOT DETECTED Final   Adenovirus F40/41 NOT DETECTED NOT DETECTED Final   Astrovirus NOT DETECTED NOT DETECTED Final   Norovirus GI/GII NOT DETECTED NOT DETECTED Final   Rotavirus A NOT DETECTED NOT DETECTED Final   Sapovirus (I, II, IV, and V) NOT DETECTED NOT DETECTED Final    Comment: Performed at [facility], [address] [place], [place] [address]  C difficile quick scan w PCR reflex     Status: None   Collection Time: 10/14/19  4:01 AM   Specimen: Stool  Result Value Ref Range Status   C Diff antigen NEGATIVE NEGATIVE Final   C Diff toxin NEGATIVE NEGATIVE Final   C Diff interpretation No C. difficile detected.  Final    Comment: Performed at [facility], [place] [address], [place], [place] [address]  Urine culture     Status: Abnormal   Collection  Time: 10/17/19 11:47 AM   Specimen: In/Out Cath Urine  Result Value Ref Range Status   Specimen Description IN/OUT CATH URINE  Final   Special Requests NONE  Final   Culture (A)  Final    <10,000 COLONIES/mL INSIGNIFICANT GROWTH Performed at [facility], [place] [address], [place], [place] [address]    Report Status 10/18/2019 FINAL  Final  MRSA PCR Screening     Status: None   Collection Time: 10/17/19 11:47 AM   Specimen: Nasal Mucosa; Nasopharyngeal  Result Value Ref Range Status   MRSA by PCR NEGATIVE NEGATIVE Final    Comment:        The GeneXpert MRSA Assay (FDA approved for NASAL specimens only), is one component of a comprehensive MRSA colonization surveillance program. It is not intended to diagnose MRSA infection nor to guide or monitor treatment for MRSA infections. Performed at [facility], [place] [address], [place], [place] [address]          Radiology Studies: No results found.      Scheduled Meds:  atorvastatin  40 mg Oral Daily   cholecalciferol  1,000 Units Oral Daily   feeding supplement (ENSURE ENLIVE)  237 mL Oral BID BM   insulin aspart  0-5 Units Subcutaneous QHS   insulin aspart  0-6 Units Subcutaneous TID WC   memantine  10 mg Oral BID   mometasone-formoterol  2 puff  Inhalation BID   pantoprazole (PROTONIX) IV  40 mg Intravenous Q12H   sodium chloride flush  10-40 mL Intracatheter Q12H   Continuous Infusions:  cefTRIAXone (ROCEPHIN)  IV 2 g (10/18/19 0930)     LOS: 5 days    Time spent: 25 minutes with over 50% of the time coordinating the patient's care    Harold Hedge, DO [facility] Pager [phone]  If 7PM-7AM, please contact night-coverage [email or web address] Password [facility] 10/18/2019, 3:34 PM

## 2019-10-19 LAB — MAGNESIUM: Magnesium: 2 mg/dL (ref 1.7–2.4)

## 2019-10-19 LAB — CBC
HCT: 31.9 % — ABNORMAL LOW (ref 36.0–46.0)
Hemoglobin: 10.3 g/dL — ABNORMAL LOW (ref 12.0–15.0)
MCH: 31.8 pg (ref 26.0–34.0)
MCHC: 32.3 g/dL (ref 30.0–36.0)
MCV: 98.5 fL (ref 80.0–100.0)
Platelets: 154 10*3/uL (ref 150–400)
RBC: 3.24 MIL/uL — ABNORMAL LOW (ref 3.87–5.11)
RDW: 12.3 % (ref 11.5–15.5)
WBC: 11.4 10*3/uL — ABNORMAL HIGH (ref 4.0–10.5)
nRBC: 0 % (ref 0.0–0.2)

## 2019-10-19 LAB — BASIC METABOLIC PANEL
Anion gap: 8 (ref 5–15)
BUN: 9 mg/dL (ref 8–23)
CO2: 28 mmol/L (ref 22–32)
Calcium: 7.5 mg/dL — ABNORMAL LOW (ref 8.9–10.3)
Chloride: 106 mmol/L (ref 98–111)
Creatinine, Ser: 0.78 mg/dL (ref 0.44–1.00)
GFR calc Af Amer: >60 mL/min (ref >60)
GFR calc non Af Amer: >60 mL/min (ref >60)
Glucose, Bld: 148 mg/dL — ABNORMAL HIGH (ref 70–99)
Potassium: 3.5 mmol/L (ref 3.5–5.1)
Sodium: 142 mmol/L (ref 135–145)

## 2019-10-19 LAB — GLUCOSE, CAPILLARY
Glucose-Capillary: 128 mg/dL — ABNORMAL HIGH (ref 70–99)
Glucose-Capillary: 145 mg/dL — ABNORMAL HIGH (ref 70–99)
Glucose-Capillary: 168 mg/dL — ABNORMAL HIGH (ref 70–99)
Glucose-Capillary: 175 mg/dL — ABNORMAL HIGH (ref 70–99)

## 2019-10-19 NOTE — Progress Notes (Signed)
PROGRESS NOTE    [name]    Code Status: DNR  XH:[id number] DOB: October 10, 1941 DOA: 10/13/2019  PCP: Hennie Duos, MD    Hospital Summary  78 year old female with history of Alzheimer's dementia, CAD s/p PCI, chronic diastolic congestive heart failure, COPD, insulin-dependent diabetes mellitus, hypertension, hyperlipidemia was brought to the emergency department for lethargy, altered mental status.  Nursing facility reported that patient had vomiting and diarrhea started on Friday.  Patient came to the emergency department was found to be hypoglycemic.  Patient also had one single episode of coffee-ground emesis 3 days ago.  Patient is DO NOT RESUSCITATE.  Work-up in the emergency department patient is found to have WBC count of 25,000, lactic acid 6.7, BUN 106, creatinine 7.2.  Patient's baseline creatinine is 0.8.  Patient is also found to have bicarb of 10, potassium 5.1, anion gap of 27.  COVID-19 came back to be negative.  Blood cultures were obtained.  Chest x-ray did not show any infiltrates.  CT head without contrast showed no acute intracranial abnormality.  CT abdomen and pelvis, CT chest without any abnormalities.  A & P   Principal Problem:   Acute renal failure (ARF) (HCC) Active Problems:   Lactic acidosis   Metabolic acidosis   Acute metabolic encephalopathy   Viral gastroenteritis   AKI (acute kidney injury) ([place])  ## Prerenal Acute renal failure in setting of severe sepsis as below -avoid nephrotoxic agents -Cr 6.57 on admission, resolved with IV fluids -Renal ultrasound demonstrating no acute hydronephrosis or obstructive uropathy. -Continue to monitor  ##Severe sepsis from Salmonella enteritidis bacteremia, gastroenteritis Admitted with elevated lactic acid of 7, elevated white blood cell count of 25,000, tachycardia.  WBC 11.4 today, afebrile and hemodynamically stable Day 7/14 antibiotics, currently on Rocephin C. difficile toxin negative -Continue  antibiotics. Can switch to Levofloxacin PO or Bactrim PO at discharge  ##acute metabolic encephalopathy, improved Secondary to uremia and electrolyte imbalances, sepsis Daughter reports patient has mainly nonverbal at baseline Patient with underlying  Advanced dementia -Hold any sedating medications -more alert and responding with head nod with yes/no and following some commands today  Salmonella bacteremia Day 7/14 antibiotics blood cultures x2 positive for Salmonella enteritidis -continue with Rocephin, antibiotic plan as above  ##Acute gastroenteritis difficile toxin-negative -antibiotics as above  ##GI bleed -Hb stable between 10-11, no reported bloody BM -Patient had one episode of coffee-ground emesis - p.o. Protonix twice daily -Closely follow-up with CBC -Hemoglobin stable  ##Hyperkalemia -From metabolic acidosis -Correct the underlying condition and follow-up -resolved  ##Hypokalemia -resolved  ##Hypomagnesemia -resolved  ##Severe dehydration -Secondary to decreased p.o. intake, diarrhea -resolved  ##dementia without behavioral disorder -Continue Namenda -Continue supportive care. -mostly nonverbal at baseline per family  ##depression and poor oral intake -Continue Remeron.  ##Diabetes mellitus -Hemoglobin A1c 6.7 -Follow-up on sliding scale insulin   DVT prophylaxis: SCDs Diet: Carb modified Family Communication: Discussed with daughter over the phone yesterday Disposition Plan: Clinical improvement, Dc to SNF in 1-2 days when bed available  Consultants  Nephrology  Procedures  None  Antibiotics   Anti-infectives (From admission, onward)   Start     Dose/Rate Route Frequency Ordered Stop   10/17/19 1000  cefTRIAXone (ROCEPHIN) 2 g in sodium chloride 0.9 % 100 mL IVPB     2 g 200 mL/hr over 30 Minutes Intravenous Every 24 hours 10/16/19 1031     10/15/19 1000  cefTRIAXone (ROCEPHIN) 1 g in sodium chloride 0.9 % 100 mL IVPB  Status:  Discontinued     1 g 200 mL/hr over 30 Minutes Intravenous Every 24 hours 10/15/19 0957 10/16/19 1031   10/15/19 1000  metroNIDAZOLE (FLAGYL) IVPB 500 mg  Status:  Discontinued     500 mg 100 mL/hr over 60 Minutes Intravenous Every 8 hours 10/15/19 0957 10/17/19 1636   10/14/19 0030  meropenem (MERREM) 500 mg in sodium chloride 0.9 % 100 mL IVPB  Status:  Discontinued     500 mg 200 mL/hr over 30 Minutes Intravenous Daily at bedtime 10/13/19 2354 10/14/19 1753   10/13/19 2300  cefTRIAXone (ROCEPHIN) 2 g in sodium chloride 0.9 % 100 mL IVPB  Status:  Discontinued     2 g 200 mL/hr over 30 Minutes Intravenous Every 24 hours 10/13/19 2238 10/13/19 2344   10/13/19 2130  aztreonam (AZACTAM) 2 g in sodium chloride 0.9 % 100 mL IVPB  Status:  Discontinued     2 g 200 mL/hr over 30 Minutes Intravenous  Once 10/13/19 2116 10/13/19 2137   10/13/19 2130  metroNIDAZOLE (FLAGYL) IVPB 500 mg     500 mg 100 mL/hr over 60 Minutes Intravenous  Once 10/13/19 2116 10/14/19 0023           Subjective   Seen at bedside resting comfortably. Non verbal at baseline per daughter. Not providing much history but did nod yes/no to some questions then went back to sleep.  Objective   Vitals:   10/18/19 2052 10/18/19 2350 10/19/19 0410 10/19/19 0736  BP:  (!) 149/71 (!) 154/71 (!) 146/74  Pulse:  79 78 87  Resp:  18 16 16   Temp:  98 F (36.7 C) 97.7 F (36.5 C) 98.4 F (36.9 C)  TempSrc:  Oral Axillary Axillary  SpO2: 97% 100% 96% 97%  Weight:      Height:        Intake/Output Summary (Last 24 hours) at 10/19/2019 0911 Last data filed at 10/19/2019 0408 Gross per 24 hour  Intake 380 ml  Output 2000 ml  Net -1620 ml   Filed Weights   10/14/19 0335  Weight: 57.5 kg    Examination:  Physical Exam Vitals signs and nursing note reviewed.  Constitutional:      Comments: Awake with improved somnolence Nods yes/no to some questions Nonverbal at baseline  HENT:     Head:  Normocephalic.     Mouth/Throat:     Mouth: Mucous membranes are moist.  Cardiovascular:     Rate and Rhythm: Normal rate and regular rhythm.  Pulmonary:     Effort: Pulmonary effort is normal.     Breath sounds: Normal breath sounds.  Abdominal:     General: Abdomen is flat.     Palpations: Abdomen is soft.  Musculoskeletal:     Right lower leg: No edema.     Left lower leg: No edema.  Skin:    General: Skin is warm.  Neurological:     Comments: Seems near baseline mentation Eye tracking     Data Reviewed: I have personally reviewed following labs and imaging studies  CBC: Recent Labs  Lab 10/13/19 2010 10/14/19 0528 10/16/19 0340 10/17/19 0247 10/18/19 1007 10/19/19 0500  WBC 25.5* 21.9* 8.3 7.0 14.7* 11.4*  NEUTROABS 23.2*  --   --   --   --   --   HGB 15.3* 11.8* 10.9* 10.8* 11.4* 10.3*  HCT 48.1* 36.8 32.6* 33.3* 35.2* 31.9*  MCV 101.5* 98.9 94.2 98.2 98.9 98.5  PLT 230 212 126*  PLATELET CLUMPS NOTED ON SMEAR, UNABLE TO ESTIMATE 145* [id number]   Basic Metabolic Panel: Recent Labs  Lab 10/14/19 0528 10/15/19 0601 10/16/19 0727 10/17/19 0247 10/18/19 0330 10/19/19 0500  NA 146* 143 148* 150* 138 142  K 4.4 5.0 2.4* 3.1* 3.1* 3.5  CL 106 91* 86* 91* 92* 106  CO2 15* 29 43* 45* 33* 28  GLUCOSE 128* 89 148* 186* 254* 148*  BUN 104* 103* 57* 33* 13 9  CREATININE 6.57* 5.18* 2.31* 1.28* 0.94 0.78  CALCIUM 8.5* 7.2* 6.9* 7.0* 7.1* 7.5*  MG 1.7  --  2.5*  --  1.2* 2.0  PHOS 7.5*  --   --   --   --   --    GFR: Estimated Creatinine Clearance: 50.9 mL/min (by C-G formula based on SCr of 0.78 mg/dL). Liver Function Tests: Recent Labs  Lab 10/13/19 2117 10/14/19 0528  AST 29  --   ALT 16  --   ALKPHOS 105  --   BILITOT 0.6  --   PROT 9.1*  --   ALBUMIN 3.5 2.6*   Recent Labs  Lab 10/13/19 2117  LIPASE 20   No results for input(s): AMMONIA in the last 168 hours. Coagulation Profile: Recent Labs  Lab 10/13/19 2159  INR 1.2   Cardiac Enzymes: No  results for input(s): CKTOTAL, CKMB, CKMBINDEX, TROPONINI in the last 168 hours. BNP (last 3 results) No results for input(s): PROBNP in the last 8760 hours. HbA1C: No results for input(s): HGBA1C in the last 72 hours. CBG: Recent Labs  Lab 10/18/19 0605 10/18/19 1153 10/18/19 1614 10/18/19 2149 10/19/19 0603  GLUCAP 208* 198* 167* 157* 128*   Lipid Profile: No results for input(s): CHOL, HDL, LDLCALC, TRIG, CHOLHDL, LDLDIRECT in the last 72 hours. Thyroid Function Tests: No results for input(s): TSH, T4TOTAL, FREET4, T3FREE, THYROIDAB in the last 72 hours. Anemia Panel: No results for input(s): VITAMINB12, FOLATE, FERRITIN, TIBC, IRON, RETICCTPCT in the last 72 hours. Sepsis Labs: Recent Labs  Lab 10/13/19 2010 10/14/19 0528 10/14/19 0820  PROCALCITON  --  1.99  --   LATICACIDVEN 6.7* 5.3* 4.3*    Recent Results (from the past 240 hour(s))  Blood Culture (routine x 2)     Status: Abnormal (Preliminary result)   Collection Time: 10/13/19 10:00 PM   Specimen: BLOOD RIGHT WRIST  Result Value Ref Range Status   Specimen Description BLOOD RIGHT WRIST  Final   Special Requests   Final    BOTTLES DRAWN AEROBIC AND ANAEROBIC Blood Culture results may not be optimal due to an inadequate volume of blood received in culture bottles   Culture  Setup Time   Final    GRAM NEGATIVE RODS AEROBIC BOTTLE ONLY CRITICAL RESULT CALLED TO, READ BACK BY AND VERIFIED WITH: Hughie Closs PharmD 10:30 10/16/19 ([email or web address])    Culture (A)  Final    SALMONELLA ENTERITIDIS CULTURE REINCUBATED FOR BETTER GROWTH Performed at [facility], [address], [place], [place] [address]    Report Status PENDING  Incomplete   Organism ID, Bacteria SALMONELLA ENTERITIDIS  Final      Susceptibility   Salmonella enteritidis - MIC*    AMPICILLIN <=2 SENSITIVE Sensitive     LEVOFLOXACIN <=0.12 SENSITIVE Sensitive     TRIMETH/SULFA <=20 SENSITIVE Sensitive     * SALMONELLA ENTERITIDIS  Blood Culture  (routine x 2)     Status: Abnormal (Preliminary result)   Collection Time: 10/13/19 10:00 PM   Specimen: BLOOD  Result Value Ref Range Status   Specimen Description BLOOD LEFT ANKLE  Final   Special Requests   Final    BOTTLES DRAWN AEROBIC ONLY Blood Culture results may not be optimal due to an inadequate volume of blood received in culture bottles   Culture  Setup Time   Final    GRAM NEGATIVE RODS AEROBIC BOTTLE ONLY CRITICAL VALUE NOTED.  VALUE IS CONSISTENT WITH PREVIOUSLY REPORTED AND CALLED VALUE.    Culture (A)  Final    SALMONELLA ENTERITIDIS CULTURE REINCUBATED FOR BETTER GROWTH Performed at [facility], [place] [address] [place], [place] [address]    Report Status PENDING  Incomplete  Blood Culture ID Panel (Reflexed)     Status: Abnormal   Collection Time: 10/13/19 10:00 PM  Result Value Ref Range Status   Enterococcus species NOT DETECTED NOT DETECTED Final   Listeria monocytogenes NOT DETECTED NOT DETECTED Final   Staphylococcus species NOT DETECTED NOT DETECTED Final   Staphylococcus aureus (BCID) NOT DETECTED NOT DETECTED Final   Streptococcus species NOT DETECTED NOT DETECTED Final   Streptococcus agalactiae NOT DETECTED NOT DETECTED Final   Streptococcus pneumoniae NOT DETECTED NOT DETECTED Final   Streptococcus pyogenes NOT DETECTED NOT DETECTED Final   Acinetobacter baumannii NOT DETECTED NOT DETECTED Final   Enterobacteriaceae species DETECTED (A) NOT DETECTED Final    Comment: Enterobacteriaceae represent a large family of gram negative bacteria, not a single organism. Refer to culture for further identification. CRITICAL RESULT CALLED TO, READ BACK BY AND VERIFIED WITH: Hughie Closs PharmD 10:30 10/16/19 ([email or web address])    Enterobacter cloacae complex NOT DETECTED NOT DETECTED Final   Escherichia coli NOT DETECTED NOT DETECTED Final   Klebsiella oxytoca NOT DETECTED NOT DETECTED Final   Klebsiella pneumoniae NOT DETECTED NOT DETECTED Final   Proteus species  NOT DETECTED NOT DETECTED Final   Serratia marcescens NOT DETECTED NOT DETECTED Final   Carbapenem resistance NOT DETECTED NOT DETECTED Final   Haemophilus influenzae NOT DETECTED NOT DETECTED Final   Neisseria meningitidis NOT DETECTED NOT DETECTED Final   Pseudomonas aeruginosa NOT DETECTED NOT DETECTED Final   Candida albicans NOT DETECTED NOT DETECTED Final   Candida glabrata NOT DETECTED NOT DETECTED Final   Candida krusei NOT DETECTED NOT DETECTED Final   Candida parapsilosis NOT DETECTED NOT DETECTED Final   Candida tropicalis NOT DETECTED NOT DETECTED Final    Comment: Performed at [facility], [place] [address] [place], [place] [address]  SARS CORONAVIRUS 2 (TAT 6-24 HRS) Nasopharyngeal Nasopharyngeal Swab     Status: None   Collection Time: 10/13/19 10:59 PM   Specimen: Nasopharyngeal Swab  Result Value Ref Range Status   SARS Coronavirus 2 NEGATIVE NEGATIVE Final    Comment: (NOTE) SARS-CoV-2 target nucleic acids are NOT DETECTED. The SARS-CoV-2 RNA is generally detectable in upper and lower respiratory specimens during the acute phase of infection. Negative results do not preclude SARS-CoV-2 infection, do not rule out co-infections with other pathogens, and should not be used as the sole basis for treatment or other patient management decisions. Negative results must be combined with clinical observations, patient history, and epidemiological information. The expected result is Negative. Fact Sheet for Patients: SugarRoll.be Fact Sheet for Healthcare Providers: https://www.woods-mathews.com/ This test is not yet approved or cleared by the [place] FDA and  has been authorized for detection and/or diagnosis of SARS-CoV-2 by FDA under an Emergency Use Authorization (EUA). This EUA will remain  in effect (meaning this test can  be used) for the duration of the COVID-19 declaration under Section 56 4(b)(1) of the Act, 21  U.S.C. section 360bbb-3(b)(1), unless the authorization is terminated or revoked sooner. Performed at [facility], [place] [address], [place], [place] [address]   Gastrointestinal Panel by PCR , Stool     Status: Abnormal   Collection Time: 10/14/19  4:01 AM   Specimen: Stool  Result Value Ref Range Status   Campylobacter species NOT DETECTED NOT DETECTED Final   Plesimonas shigelloides NOT DETECTED NOT DETECTED Final   Salmonella species DETECTED (A) NOT DETECTED Final    Comment: RESULT CALLED TO, READ BACK BY AND VERIFIED WITH: HEATHER HUNTER 10/15/19 1339 KLW    Yersinia enterocolitica NOT DETECTED NOT DETECTED Final   Vibrio species NOT DETECTED NOT DETECTED Final   Vibrio cholerae NOT DETECTED NOT DETECTED Final   Enteroaggregative E coli (EAEC) NOT DETECTED NOT DETECTED Final   Enteropathogenic E coli (EPEC) NOT DETECTED NOT DETECTED Final   Enterotoxigenic E coli (ETEC) NOT DETECTED NOT DETECTED Final   Shiga like toxin producing E coli (STEC) NOT DETECTED NOT DETECTED Final   Shigella/Enteroinvasive E coli (EIEC) NOT DETECTED NOT DETECTED Final   Cryptosporidium NOT DETECTED NOT DETECTED Final   Cyclospora cayetanensis NOT DETECTED NOT DETECTED Final   Entamoeba histolytica NOT DETECTED NOT DETECTED Final   Giardia lamblia NOT DETECTED NOT DETECTED Final   Adenovirus F40/41 NOT DETECTED NOT DETECTED Final   Astrovirus NOT DETECTED NOT DETECTED Final   Norovirus GI/GII NOT DETECTED NOT DETECTED Final   Rotavirus A NOT DETECTED NOT DETECTED Final   Sapovirus (I, II, IV, and V) NOT DETECTED NOT DETECTED Final    Comment: Performed at [facility], [address] [place], [place] [address]  C difficile quick scan w PCR reflex     Status: None   Collection Time: 10/14/19  4:01 AM   Specimen: Stool  Result Value Ref Range Status   C Diff antigen NEGATIVE NEGATIVE Final   C Diff toxin NEGATIVE NEGATIVE Final   C Diff interpretation No C. difficile detected.   Final    Comment: Performed at [facility], [place] [address], [place], [place] [address]  Urine culture     Status: Abnormal   Collection Time: 10/17/19 11:47 AM   Specimen: In/Out Cath Urine  Result Value Ref Range Status   Specimen Description IN/OUT CATH URINE  Final   Special Requests NONE  Final   Culture (A)  Final    <10,000 COLONIES/mL INSIGNIFICANT GROWTH Performed at [facility], [place] [address], [place], [place] [address]    Report Status 10/18/2019 FINAL  Final  MRSA PCR Screening     Status: None   Collection Time: 10/17/19 11:47 AM   Specimen: Nasal Mucosa; Nasopharyngeal  Result Value Ref Range Status   MRSA by PCR NEGATIVE NEGATIVE Final    Comment:        The GeneXpert MRSA Assay (FDA approved for NASAL specimens only), is one component of a comprehensive MRSA colonization surveillance program. It is not intended to diagnose MRSA infection nor to guide or monitor treatment for MRSA infections. Performed at [facility], [place] [address], [place],  [address]          Radiology Studies: No results found.      Scheduled Meds:  atorvastatin  40 mg Oral Daily   cholecalciferol  1,000 Units Oral Daily   feeding supplement (ENSURE ENLIVE)  237 mL Oral BID BM   insulin aspart  0-5 Units Subcutaneous QHS   insulin aspart  0-6 Units Subcutaneous TID WC   memantine  10 mg Oral BID   mometasone-formoterol  2 puff Inhalation BID   pantoprazole  40 mg Oral BID   sodium chloride flush  10-40 mL Intracatheter Q12H   Continuous Infusions:  cefTRIAXone (ROCEPHIN)  IV 2 g (10/19/19 0858)     LOS: 6 days    Time spent: 15 minutes with over 50% of the time coordinating the patient's care    Harold Hedge, DO [facility] Pager [phone]  If 7PM-7AM, please contact night-coverage [email or web address] Password [email or web address] 10/19/2019, 9:11 AM

## 2019-10-20 LAB — CULTURE, BLOOD (ROUTINE X 2)

## 2019-10-20 LAB — BASIC METABOLIC PANEL
Anion gap: 13 (ref 5–15)
BUN: 10 mg/dL (ref 8–23)
CO2: 24 mmol/L (ref 22–32)
Calcium: 7.8 mg/dL — ABNORMAL LOW (ref 8.9–10.3)
Chloride: 104 mmol/L (ref 98–111)
Creatinine, Ser: 0.74 mg/dL (ref 0.44–1.00)
GFR calc Af Amer: >60 mL/min (ref >60)
GFR calc non Af Amer: >60 mL/min (ref >60)
Glucose, Bld: 126 mg/dL — ABNORMAL HIGH (ref 70–99)
Potassium: 4.1 mmol/L (ref 3.5–5.1)
Sodium: 141 mmol/L (ref 135–145)

## 2019-10-20 LAB — MAGNESIUM: Magnesium: 1.6 mg/dL — ABNORMAL LOW (ref 1.7–2.4)

## 2019-10-20 LAB — GLUCOSE, CAPILLARY
Glucose-Capillary: 123 mg/dL — ABNORMAL HIGH (ref 70–99)
Glucose-Capillary: 126 mg/dL — ABNORMAL HIGH (ref 70–99)
Glucose-Capillary: 173 mg/dL — ABNORMAL HIGH (ref 70–99)
Glucose-Capillary: 195 mg/dL — ABNORMAL HIGH (ref 70–99)

## 2019-10-20 LAB — CBC
HCT: 31.1 % — ABNORMAL LOW (ref 36.0–46.0)
Hemoglobin: 10 g/dL — ABNORMAL LOW (ref 12.0–15.0)
MCH: 31.8 pg (ref 26.0–34.0)
MCHC: 32.2 g/dL (ref 30.0–36.0)
MCV: 99 fL (ref 80.0–100.0)
Platelets: 174 10*3/uL (ref 150–400)
RBC: 3.14 MIL/uL — ABNORMAL LOW (ref 3.87–5.11)
RDW: 12.4 % (ref 11.5–15.5)
WBC: 8.7 10*3/uL (ref 4.0–10.5)
nRBC: 0 % (ref 0.0–0.2)

## 2019-10-20 LAB — SARS CORONAVIRUS 2 (TAT 6-24 HRS): SARS Coronavirus 2: NEGATIVE

## 2019-10-20 MED ORDER — MAGNESIUM SULFATE 2 GM/50ML IV SOLN
2.0000 g | Freq: Once | INTRAVENOUS | Status: AC
  Administered 2019-10-20: 2 g via INTRAVENOUS
  Filled 2019-10-20: qty 50

## 2019-10-20 MED ORDER — AMLODIPINE BESYLATE 5 MG PO TABS
5.0000 mg | ORAL_TABLET | Freq: Every day | ORAL | Status: DC
  Administered 2019-10-20 – 2019-10-21 (×2): 5 mg via ORAL
  Filled 2019-10-20 (×2): qty 1

## 2019-10-20 MED ORDER — SODIUM CHLORIDE 0.9 % IV SOLN
INTRAVENOUS | Status: AC
  Administered 2019-10-20 (×2): via INTRAVENOUS

## 2019-10-20 NOTE — Progress Notes (Signed)
PROGRESS NOTE    [name]    Code Status: DNR  [id number] DOB: 07/20/1941 DOA: 10/13/2019  PCP: Hennie Duos, MD    Hospital Summary  78 year old female with history of Alzheimer's dementia, CAD s/p PCI, chronic diastolic congestive heart failure, COPD, insulin-dependent diabetes mellitus, hypertension, hyperlipidemia was brought to the emergency department for lethargy, altered mental status.  Nursing facility reported that patient had vomiting and diarrhea started on Friday.  Patient came to the emergency department was found to be hypoglycemic.  Patient also had one single episode of coffee-ground emesis 3 days ago.  Patient is DO NOT RESUSCITATE.  Work-up in the emergency department patient is found to have WBC count of 25,000, lactic acid 6.7, BUN 106, creatinine 7.2.  Patient's baseline creatinine is 0.8.  Patient is also found to have bicarb of 10, potassium 5.1, anion gap of 27.  COVID-19 came back to be negative.  Blood cultures were obtained.  Chest x-ray did not show any infiltrates.  CT head without contrast showed no acute intracranial abnormality.  CT abdomen and pelvis, CT chest without any abnormalities.  A & P   Principal Problem:   Acute renal failure (ARF) (HCC) Active Problems:   Lactic acidosis   Metabolic acidosis   Acute metabolic encephalopathy   Viral gastroenteritis   AKI (acute kidney injury) ([place])  ## Prerenal Acute renal failure in setting of severe sepsis as below Resolved -avoid nephrotoxic agents -Cr 6.57 on admission, resolved with IV fluids -Renal ultrasound demonstrating no acute hydronephrosis or obstructive uropathy. -Continue to monitor  ##Severe sepsis from Salmonella enteritidis bacteremia, gastroenteritis Admitted with elevated lactic acid of 7, elevated white blood cell count of 25,000, tachycardia.  afebrile and hemodynamically stable without leukocytosis Day 8/14 antibiotics, currently on Rocephin C. difficile toxin  negative -Continue antibiotics. Can switch to Levofloxacin PO or Bactrim PO at discharge  ##acute metabolic encephalopathy, resolved Secondary to uremia and electrolyte imbalances, sepsis Daughter reports patient is mainly nonverbal at baseline but typically only says "help me" at home, this was observed today and patient appears at baseline Patient with underlying  Advanced dementia -Hold any sedating medications  Salmonella bacteremia Day 8/14 antibiotics blood cultures x2 positive for Salmonella enteritidis -continue with Rocephin, antibiotic plan as above  ##Acute gastroenteritis difficile toxin-negative -antibiotics as above  ##GI bleed -Hb stable between 10-11, no reported bloody BM -Patient had one episode of coffee-ground emesis - p.o. Protonix twice daily -Closely follow-up with CBC  ##Hyperkalemia -From metabolic acidosis -Correct the underlying condition and follow-up -resolved  ##Hypokalemia -resolved  ##Hypomagnesemia -Replete  ##Severe dehydration -Secondary to decreased p.o. intake, diarrhea -resolved  ##dementia without behavioral disorder -Continue Namenda -Continue supportive care. -mostly nonverbal at baseline per family  ##depression and poor oral intake -Continue Remeron.  ##Diabetes mellitus -Hemoglobin A1c 6.7 -Follow-up on sliding scale insulin   DVT prophylaxis: SCDs Diet: Carb modified Family Communication: Family updated by phone Disposition Plan: Medically stable for discharge, repeat Covid negative, likely DC in a.m. when bed available  Consultants  Nephrology  Procedures  None  Antibiotics   Anti-infectives (From admission, onward)   Start     Dose/Rate Route Frequency Ordered Stop   10/17/19 1000  cefTRIAXone (ROCEPHIN) 2 g in sodium chloride 0.9 % 100 mL IVPB     2 g 200 mL/hr over 30 Minutes Intravenous Every 24 hours 10/16/19 1031     10/15/19 1000  cefTRIAXone (ROCEPHIN) 1 g in sodium chloride 0.9 % 100  mL IVPB  Status:  Discontinued     1 g 200 mL/hr over 30 Minutes Intravenous Every 24 hours 10/15/19 0957 10/16/19 1031   10/15/19 1000  metroNIDAZOLE (FLAGYL) IVPB 500 mg  Status:  Discontinued     500 mg 100 mL/hr over 60 Minutes Intravenous Every 8 hours 10/15/19 0957 10/17/19 1636   10/14/19 0030  meropenem (MERREM) 500 mg in sodium chloride 0.9 % 100 mL IVPB  Status:  Discontinued     500 mg 200 mL/hr over 30 Minutes Intravenous Daily at bedtime 10/13/19 2354 10/14/19 1753   10/13/19 2300  cefTRIAXone (ROCEPHIN) 2 g in sodium chloride 0.9 % 100 mL IVPB  Status:  Discontinued     2 g 200 mL/hr over 30 Minutes Intravenous Every 24 hours 10/13/19 2238 10/13/19 2344   10/13/19 2130  aztreonam (AZACTAM) 2 g in sodium chloride 0.9 % 100 mL IVPB  Status:  Discontinued     2 g 200 mL/hr over 30 Minutes Intravenous  Once 10/13/19 2116 10/13/19 2137   10/13/19 2130  metroNIDAZOLE (FLAGYL) IVPB 500 mg     500 mg 100 mL/hr over 60 Minutes Intravenous  Once 10/13/19 2116 10/14/19 0023           Subjective   Patient lying flat and resting comfortably.  She is a poor historian and nonverbal at baseline the exception of saying "help me" and per daughter this is normal for her to say and is the only thing that she says.  Objective   Vitals:   10/20/19 0523 10/20/19 0727 10/20/19 1143 10/20/19 1519  BP: (!) 159/83 (!) 175/74 (!) 160/92 (!) 142/58  Pulse: 91 89 88 91  Resp: 18 18 18 18   Temp: 98 F (36.7 C) 98.5 F (36.9 C) 97.8 F (36.6 C) 98.4 F (36.9 C)  TempSrc: Axillary Axillary Oral Oral  SpO2: 100% 98% 98% 95%  Weight:      Height:        Intake/Output Summary (Last 24 hours) at 10/20/2019 1557 Last data filed at 10/20/2019 1300 Gross per 24 hour  Intake 280 ml  Output --  Net 280 ml   Filed Weights   10/14/19 0335  Weight: 57.5 kg    Examination:  Physical Exam Vitals signs and nursing note reviewed.  Constitutional:      Appearance: She is not  ill-appearing.     Comments: Reaching out with right hand and tracking with eyes some verbalization  HENT:     Head: Normocephalic and atraumatic.  Eyes:     Comments: Tracks today  Cardiovascular:     Rate and Rhythm: Normal rate and regular rhythm.  Pulmonary:     Effort: Pulmonary effort is normal.     Breath sounds: Normal breath sounds.  Abdominal:     General: Abdomen is flat.     Palpations: Abdomen is soft.  Musculoskeletal:        General: No deformity.     Right lower leg: No edema.     Left lower leg: No edema.  Neurological:     Mental Status: She is alert. Mental status is at baseline.  Psychiatric:        Mood and Affect: Mood normal.        Behavior: Behavior normal.     Data Reviewed: I have personally reviewed following labs and imaging studies  CBC: Recent Labs  Lab 10/13/19 2010  10/16/19 0340 10/17/19 0247 10/18/19 1007 10/19/19 0500 10/20/19 0500  WBC 25.5*   < >  8.3 7.0 14.7* 11.4* 8.7  NEUTROABS 23.2*  --   --   --   --   --   --   HGB 15.3*   < > 10.9* 10.8* 11.4* 10.3* 10.0*  HCT 48.1*   < > 32.6* 33.3* 35.2* 31.9* 31.1*  MCV 101.5*   < > 94.2 98.2 98.9 98.5 99.0  PLT 230   < > 126* PLATELET CLUMPS NOTED ON SMEAR, UNABLE TO ESTIMATE 145* 154 174   < > = values in this interval not displayed.   Basic Metabolic Panel: Recent Labs  Lab 10/14/19 0528  10/16/19 0727 10/17/19 0247 10/18/19 0330 10/19/19 0500 10/20/19 0500  NA 146*   < > 148* 150* 138 142 141  K 4.4   < > 2.4* 3.1* 3.1* 3.5 4.1  CL 106   < > 86* 91* 92* 106 104  CO2 15*   < > 43* 45* 33* 28 24  GLUCOSE 128*   < > 148* 186* 254* 148* 126*  BUN 104*   < > 57* 33* 13 9 10   CREATININE 6.57*   < > 2.31* 1.28* 0.94 0.78 0.74  CALCIUM 8.5*   < > 6.9* 7.0* 7.1* 7.5* 7.8*  MG 1.7  --  2.5*  --  1.2* 2.0 1.6*  PHOS 7.5*  --   --   --   --   --   --    < > = values in this interval not displayed.   GFR: Estimated Creatinine Clearance: 50.9 mL/min (by C-G formula based on SCr  of 0.74 mg/dL). Liver Function Tests: Recent Labs  Lab 10/13/19 2117 10/14/19 0528  AST 29  --   ALT 16  --   ALKPHOS 105  --   BILITOT 0.6  --   PROT 9.1*  --   ALBUMIN 3.5 2.6*   Recent Labs  Lab 10/13/19 2117  LIPASE 20   No results for input(s): AMMONIA in the last 168 hours. Coagulation Profile: Recent Labs  Lab 10/13/19 2159  INR 1.2   Cardiac Enzymes: No results for input(s): CKTOTAL, CKMB, CKMBINDEX, TROPONINI in the last 168 hours. BNP (last 3 results) No results for input(s): PROBNP in the last 8760 hours. HbA1C: No results for input(s): HGBA1C in the last 72 hours. CBG: Recent Labs  Lab 10/19/19 1128 10/19/19 1626 10/19/19 2148 10/20/19 0627 10/20/19 1146  GLUCAP 145* 168* 175* 123* 126*   Lipid Profile: No results for input(s): CHOL, HDL, LDLCALC, TRIG, CHOLHDL, LDLDIRECT in the last 72 hours. Thyroid Function Tests: No results for input(s): TSH, T4TOTAL, FREET4, T3FREE, THYROIDAB in the last 72 hours. Anemia Panel: No results for input(s): VITAMINB12, FOLATE, FERRITIN, TIBC, IRON, RETICCTPCT in the last 72 hours. Sepsis Labs: Recent Labs  Lab 10/13/19 2010 10/14/19 0528 10/14/19 0820  PROCALCITON  --  1.99  --   LATICACIDVEN 6.7* 5.3* 4.3*    Recent Results (from the past 240 hour(s))  Blood Culture (routine x 2)     Status: Abnormal (Preliminary result)   Collection Time: 10/13/19 10:00 PM   Specimen: BLOOD RIGHT WRIST  Result Value Ref Range Status   Specimen Description BLOOD RIGHT WRIST  Final   Special Requests   Final    BOTTLES DRAWN AEROBIC AND ANAEROBIC Blood Culture results may not be optimal due to an inadequate volume of blood received in culture bottles   Culture  Setup Time   Final    GRAM NEGATIVE RODS AEROBIC BOTTLE ONLY CRITICAL RESULT  CALLED TO, READ BACK BY AND VERIFIED WITH: Hughie Closs PharmD 10:30 10/16/19 ([email or web address])    Culture (A)  Final    [facility] NOTIFIED REFERRED TO [place] IN [place] IDENTIFICATION/CONFIRMATION Performed at [facility], [place] [address], [place], [place] [address]    Report Status PENDING  Incomplete   Organism ID, Bacteria SALMONELLA ENTERITIDIS  Final      Susceptibility   Salmonella enteritidis - MIC*    AMPICILLIN <=2 SENSITIVE Sensitive     LEVOFLOXACIN <=0.12 SENSITIVE Sensitive     TRIMETH/SULFA <=20 SENSITIVE Sensitive     * SALMONELLA ENTERITIDIS  Blood Culture (routine x 2)     Status: Abnormal   Collection Time: 10/13/19 10:00 PM   Specimen: BLOOD  Result Value Ref Range Status   Specimen Description BLOOD LEFT ANKLE  Final   Special Requests   Final    BOTTLES DRAWN AEROBIC ONLY Blood Culture results may not be optimal due to an inadequate volume of blood received in culture bottles   Culture  Setup Time   Final    GRAM NEGATIVE RODS AEROBIC BOTTLE ONLY CRITICAL VALUE NOTED.  VALUE IS CONSISTENT WITH PREVIOUSLY REPORTED AND CALLED VALUE.    Culture (A)  Final    SALMONELLA ENTERITIDIS SUSCEPTIBILITIES PERFORMED ON PREVIOUS CULTURE WITHIN THE LAST 5 DAYS. HEALTH DEPARTMENT NOTIFIED Performed at [facility], [place] [address], [place], [place] [address]    Report Status 10/20/2019 FINAL  Final  Blood Culture ID Panel (Reflexed)     Status: Abnormal   Collection Time: 10/13/19 10:00 PM  Result Value Ref Range Status   Enterococcus species NOT DETECTED NOT DETECTED Final   Listeria monocytogenes NOT DETECTED NOT DETECTED Final   Staphylococcus species NOT DETECTED NOT DETECTED Final   Staphylococcus aureus (BCID) NOT DETECTED NOT DETECTED Final   Streptococcus species NOT DETECTED NOT DETECTED Final   Streptococcus agalactiae NOT DETECTED NOT DETECTED Final   Streptococcus pneumoniae NOT DETECTED NOT DETECTED Final   Streptococcus pyogenes NOT DETECTED NOT DETECTED Final   Acinetobacter baumannii NOT DETECTED NOT DETECTED Final   Enterobacteriaceae species DETECTED  (A) NOT DETECTED Final    Comment: Enterobacteriaceae represent a large family of gram negative bacteria, not a single organism. Refer to culture for further identification. CRITICAL RESULT CALLED TO, READ BACK BY AND VERIFIED WITH: Hughie Closs PharmD 10:30 10/16/19 ([email or web address])    Enterobacter cloacae complex NOT DETECTED NOT DETECTED Final   Escherichia coli NOT DETECTED NOT DETECTED Final   Klebsiella oxytoca NOT DETECTED NOT DETECTED Final   Klebsiella pneumoniae NOT DETECTED NOT DETECTED Final   Proteus species NOT DETECTED NOT DETECTED Final   Serratia marcescens NOT DETECTED NOT DETECTED Final   Carbapenem resistance NOT DETECTED NOT DETECTED Final   Haemophilus influenzae NOT DETECTED NOT DETECTED Final   Neisseria meningitidis NOT DETECTED NOT DETECTED Final   Pseudomonas aeruginosa NOT DETECTED NOT DETECTED Final   Candida albicans NOT DETECTED NOT DETECTED Final   Candida glabrata NOT DETECTED NOT DETECTED Final   Candida krusei NOT DETECTED NOT DETECTED Final   Candida parapsilosis NOT DETECTED NOT DETECTED Final   Candida tropicalis NOT DETECTED NOT DETECTED Final    Comment: Performed at [facility], [place] [address] [place], [place] [address]  SARS CORONAVIRUS 2 (TAT 6-24 HRS) Nasopharyngeal Nasopharyngeal Swab     Status: None   Collection Time: 10/13/19 10:59 PM   Specimen: Nasopharyngeal Swab  Result Value Ref Range Status   SARS Coronavirus 2 NEGATIVE NEGATIVE Final    Comment: (NOTE) SARS-CoV-2 target nucleic acids are NOT DETECTED. The SARS-CoV-2 RNA is generally detectable in upper and lower respiratory specimens during the acute phase of infection. Negative results do not preclude SARS-CoV-2 infection, do not rule out co-infections with other pathogens, and should not be used as the sole basis for treatment or other patient management decisions. Negative results must be combined with clinical observations, patient history, and epidemiological information. The  expected result is Negative. Fact Sheet for Patients: SugarRoll.be Fact Sheet for Healthcare Providers: https://www.woods-mathews.com/ This test is not yet approved or cleared by the [place] FDA and  has been authorized for detection and/or diagnosis of SARS-CoV-2 by FDA under an Emergency Use Authorization (EUA). This EUA will remain  in effect (meaning this test can be used) for the duration of the COVID-19 declaration under Section 56 4(b)(1) of the Act, 21 U.S.C. section 360bbb-3(b)(1), unless the authorization is terminated or revoked sooner. Performed at [facility], [place] [address], [place], [place] [address]   Gastrointestinal Panel by PCR , Stool     Status: Abnormal   Collection Time: 10/14/19  4:01 AM   Specimen: Stool  Result Value Ref Range Status   Campylobacter species NOT DETECTED NOT DETECTED Final   Plesimonas shigelloides NOT DETECTED NOT DETECTED Final   Salmonella species DETECTED (A) NOT DETECTED Final    Comment: RESULT CALLED TO, READ BACK BY AND VERIFIED WITH: HEATHER HUNTER 10/15/19 1339 KLW    Yersinia enterocolitica NOT DETECTED NOT DETECTED Final   Vibrio species NOT DETECTED NOT DETECTED Final   Vibrio cholerae NOT DETECTED NOT DETECTED Final   Enteroaggregative E coli (EAEC) NOT DETECTED NOT DETECTED Final   Enteropathogenic E coli (EPEC) NOT DETECTED NOT DETECTED Final   Enterotoxigenic E coli (ETEC) NOT DETECTED NOT DETECTED Final   Shiga like toxin producing E coli (STEC) NOT DETECTED NOT DETECTED Final   Shigella/Enteroinvasive E coli (EIEC) NOT DETECTED NOT DETECTED Final   Cryptosporidium NOT DETECTED NOT DETECTED Final   Cyclospora cayetanensis NOT DETECTED NOT DETECTED Final   Entamoeba histolytica NOT DETECTED NOT DETECTED Final   Giardia lamblia NOT DETECTED NOT DETECTED Final   Adenovirus F40/41 NOT DETECTED NOT DETECTED Final   Astrovirus NOT DETECTED NOT DETECTED Final   Norovirus  GI/GII NOT DETECTED NOT DETECTED Final   Rotavirus A NOT DETECTED NOT DETECTED Final   Sapovirus (I, II, IV, and V) NOT DETECTED NOT DETECTED Final    Comment: Performed at [facility], [place] [place], [place] [address]  C difficile quick scan w PCR reflex     Status: None   Collection Time: 10/14/19  4:01 AM   Specimen: Stool  Result Value Ref Range Status   C Diff antigen NEGATIVE NEGATIVE Final   C Diff toxin NEGATIVE NEGATIVE Final   C Diff interpretation No C. difficile detected.  Final    Comment: Performed at [facility], [place] [address], [place], [place] [address]  Urine culture     Status: Abnormal   Collection Time: 10/17/19 11:47 AM   Specimen: In/Out Cath Urine  Result Value Ref Range Status   Specimen Description IN/OUT CATH URINE  Final   Special Requests NONE  Final   Culture (A)  Final    <10,000 COLONIES/mL INSIGNIFICANT GROWTH Performed at [facility], [place] [address], [place], [place] [address]  Report Status 10/18/2019 FINAL  Final  MRSA PCR Screening     Status: None   Collection Time: 10/17/19 11:47 AM   Specimen: Nasal Mucosa; Nasopharyngeal  Result Value Ref Range Status   MRSA by PCR NEGATIVE NEGATIVE Final    Comment:        The GeneXpert MRSA Assay (FDA approved for NASAL specimens only), is one component of a comprehensive MRSA colonization surveillance program. It is not intended to diagnose MRSA infection nor to guide or monitor treatment for MRSA infections. Performed at [facility], [place] [address], [place], [place] [address]   SARS CORONAVIRUS 2 (TAT 6-24 HRS) Nasopharyngeal Nasopharyngeal Swab     Status: None   Collection Time: 10/20/19  8:29 AM   Specimen: Nasopharyngeal Swab  Result Value Ref Range Status   SARS Coronavirus 2 NEGATIVE NEGATIVE Final    Comment: (NOTE) SARS-CoV-2 target nucleic acids are NOT DETECTED. The SARS-CoV-2 RNA is generally detectable in upper and lower respiratory  specimens during the acute phase of infection. Negative results do not preclude SARS-CoV-2 infection, do not rule out co-infections with other pathogens, and should not be used as the sole basis for treatment or other patient management decisions. Negative results must be combined with clinical observations, patient history, and epidemiological information. The expected result is Negative. Fact Sheet for Patients: SugarRoll.be Fact Sheet for Healthcare Providers: https://www.woods-mathews.com/ This test is not yet approved or cleared by the [place] FDA and  has been authorized for detection and/or diagnosis of SARS-CoV-2 by FDA under an Emergency Use Authorization (EUA). This EUA will remain  in effect (meaning this test can be used) for the duration of the COVID-19 declaration under Section 56 4(b)(1) of the Act, 21 U.S.C. section 360bbb-3(b)(1), unless the authorization is terminated or revoked sooner. Performed at [facility], [place] [address] [place], [place] [address]          Radiology Studies: No results found.      Scheduled Meds:  amLODipine  5 mg Oral Daily   atorvastatin  40 mg Oral Daily   cholecalciferol  1,000 Units Oral Daily   feeding supplement (ENSURE ENLIVE)  237 mL Oral BID BM   insulin aspart  0-5 Units Subcutaneous QHS   insulin aspart  0-6 Units Subcutaneous TID WC   memantine  10 mg Oral BID   mometasone-formoterol  2 puff Inhalation BID   pantoprazole  40 mg Oral BID   sodium chloride flush  10-40 mL Intracatheter Q12H   Continuous Infusions:  cefTRIAXone (ROCEPHIN)  IV 2 g (10/20/19 1035)     LOS: 7 days    Time spent: 20 minutes with over 50% of the time coordinating the patient's care    Harold Hedge, DO [facility] Pager [phone]  If 7PM-7AM, please contact night-coverage [email or web address] Password [facility] 10/20/2019, 3:57 PM

## 2019-10-20 NOTE — TOC Progression Note (Signed)
Transition of Care [facility]) - Progression Note    Patient Details  Name: [name] MRN: [id number] Date of Birth: [birth year]  Transition of Care [facility]) CM/SW [place], [place] Phone Number: 10/20/2019, 11:19 AM  Clinical Narrative:    Pt is LTC [facility] at [facility]; no authorization needed. Await new COVID screen.   Expected Discharge Plan: [place] Barriers to Discharge: Continued Medical Work up, Ship broker  Expected Discharge Plan and Services Expected Discharge Plan: [place] Choice: [place] arrangements for the past 2 months: [place]   Social Determinants of Health (SDOH) Interventions    Readmission Risk Interventions No flowsheet data found.

## 2019-10-21 ENCOUNTER — Non-Acute Institutional Stay (SKILLED_NURSING_FACILITY): Payer: Medicare Other | Admitting: Internal Medicine

## 2019-10-21 DIAGNOSIS — E1169 Type 2 diabetes mellitus with other specified complication: Secondary | ICD-10-CM

## 2019-10-21 DIAGNOSIS — N179 Acute kidney failure, unspecified: Secondary | ICD-10-CM

## 2019-10-21 DIAGNOSIS — E1159 Type 2 diabetes mellitus with other circulatory complications: Secondary | ICD-10-CM

## 2019-10-21 DIAGNOSIS — G9341 Metabolic encephalopathy: Secondary | ICD-10-CM

## 2019-10-21 DIAGNOSIS — A020 Salmonella enteritis: Secondary | ICD-10-CM | POA: Diagnosis not present

## 2019-10-21 DIAGNOSIS — A021 Salmonella sepsis: Secondary | ICD-10-CM

## 2019-10-21 DIAGNOSIS — Z794 Long term (current) use of insulin: Secondary | ICD-10-CM

## 2019-10-21 DIAGNOSIS — F0150 Vascular dementia without behavioral disturbance: Secondary | ICD-10-CM

## 2019-10-21 DIAGNOSIS — E785 Hyperlipidemia, unspecified: Secondary | ICD-10-CM

## 2019-10-21 DIAGNOSIS — R6520 Severe sepsis without septic shock: Secondary | ICD-10-CM

## 2019-10-21 LAB — BASIC METABOLIC PANEL
Anion gap: 14 (ref 5–15)
BUN: 6 mg/dL — ABNORMAL LOW (ref 8–23)
CO2: 19 mmol/L — ABNORMAL LOW (ref 22–32)
Calcium: 8.4 mg/dL — ABNORMAL LOW (ref 8.9–10.3)
Chloride: 110 mmol/L (ref 98–111)
Creatinine, Ser: 0.75 mg/dL (ref 0.44–1.00)
GFR calc Af Amer: >60 mL/min (ref >60)
GFR calc non Af Amer: >60 mL/min (ref >60)
Glucose, Bld: 166 mg/dL — ABNORMAL HIGH (ref 70–99)
Potassium: 3.7 mmol/L (ref 3.5–5.1)
Sodium: 143 mmol/L (ref 135–145)

## 2019-10-21 LAB — GLUCOSE, CAPILLARY
Glucose-Capillary: 146 mg/dL — ABNORMAL HIGH (ref 70–99)
Glucose-Capillary: 115 mg/dL — ABNORMAL HIGH (ref 70–99)

## 2019-10-21 LAB — CBC
HCT: 32.9 % — ABNORMAL LOW (ref 36.0–46.0)
Hemoglobin: 10.4 g/dL — ABNORMAL LOW (ref 12.0–15.0)
MCH: 31.5 pg (ref 26.0–34.0)
MCHC: 31.6 g/dL (ref 30.0–36.0)
MCV: 99.7 fL (ref 80.0–100.0)
Platelets: 202 10*3/uL (ref 150–400)
RBC: 3.3 MIL/uL — ABNORMAL LOW (ref 3.87–5.11)
RDW: 12.3 % (ref 11.5–15.5)
WBC: 6.6 10*3/uL (ref 4.0–10.5)
nRBC: 0 % (ref 0.0–0.2)

## 2019-10-21 LAB — MAGNESIUM: Magnesium: 1.4 mg/dL — ABNORMAL LOW (ref 1.7–2.4)

## 2019-10-21 MED ORDER — AMPICILLIN 500 MG PO CAPS: ORAL_CAPSULE | ORAL | 0 refills | Status: DC

## 2019-10-21 MED ORDER — MAG-OXIDE 200 MG PO TABS: 200.0000 mg | ORAL_TABLET | Freq: Every day | ORAL | 0 refills | Status: DC

## 2019-10-21 MED ORDER — PANTOPRAZOLE SODIUM 40 MG PO TBEC: 40.0000 mg | DELAYED_RELEASE_TABLET | Freq: Two times a day (BID) | ORAL | 0 refills | Status: DC

## 2019-10-21 MED ORDER — MAGNESIUM SULFATE 4 GM/100ML IV SOLN
4.0000 g | Freq: Once | INTRAVENOUS | Status: AC
  Administered 2019-10-21: 08:00:00 4 g via INTRAVENOUS
  Filled 2019-10-21: qty 100

## 2019-10-21 MED ORDER — AMLODIPINE BESYLATE 5 MG PO TABS: 5.0000 mg | ORAL_TABLET | Freq: Every day | ORAL | 0 refills | Status: DC

## 2019-10-21 NOTE — TOC Transition Note (Addendum)
Transition of Care [facility]) - CM/SW Discharge Note   Patient Details  Name: [name] MRN: [id number] Date of Birth: Oct 31, 1941  Transition [facility]) CM/SW Contact:  Alexander [place], [place] Phone Number: 10/21/2019, 10:49 AM   Clinical Narrative:    Pt stable for transport- CSW intern Janett Billow has completed paperwork for [facility], DNR on chart. CSW has messaged bedside RN with report number. HIPAA compliant message left for pt daughter Benjamine Mola. Pt is LTC resident at [facility], they are ready for pt to return.   CSW arranged ambulance transport via [facility] to [facility].  RN to call [phone]  with report prior to discharge.   Final next level of care: Skilled Nursing Facility Barriers to Discharge: Barriers Resolved   Patient Goals and CMS Choice Patient states their goals for this hospitalization and ongoing recovery are:: Patient unable to participate in goal planning due to confusion. CMS Medicare.gov Compare Post Acute Care list provided to:: Patient Represenative (must comment) Choice offered to / list presented to : Adult Children  Discharge Placement   Existing PASRR number confirmed : 10/17/19          Patient chooses bed at: [facility] Patient to be transferred to facility by: [place] Name of family member notified: pt daughter Benjamine Mola Patient and family notified of of transfer: 10/21/19  Discharge Plan and Services Post Acute Care Choice: [place]            Readmission Risk Interventions No flowsheet data found.

## 2019-10-21 NOTE — Discharge Summary (Signed)
Physician Discharge Summary  [name] [id number] DOB: [birth year] DOA: 10/13/2019  PCP: Hennie Duos, MD  Admit date: 10/13/2019 Discharge date: 10/21/2019   Code Status: DNR  Admitted From: Long-term facility Discharged to: Long-term facility Discharge Condition: Stable  Recommendations for Outpatient Follow-up   1. Follow up with PCP in 1 week 2. Please follow up BMP/CBC/mag 3. Started on Mag-Ox at discharge 4. Started on PPI twice daily 5. Started on Ampicillin 1000 mg in AM and 500 mg in PM for 2 days 6. Amlodipine 5 mg daily 7. Consider allowing for HA1c 7-8 in elderly patient with hypoglycemic episode  Hospital Summary  78 year old female with history of Alzheimer's dementia, CAD s/p PCI, chronic diastolic congestive heart failure, COPD, insulin-dependent diabetes mellitus, hypertension, hyperlipidemia was brought to the emergency department for lethargy, altered mental status. Nursing facility reported that patient had vomiting and diarrhea started on Friday. Patient came to the emergency department was found to be hypoglycemic. Patient also had one single episode of coffee-ground emesis 3 days ago. Patient is DO NOT RESUSCITATE. Work-up in the emergency department patient is found to have WBC count of 25,000, lactic acid 6.7, BUN 106, creatinine 7.2. Patient's baseline creatinine is 0.8. Patient is also found to have bicarb of 10, potassium 5.1, anion gap of 27. COVID-19 came back to be negative. Blood cultures were obtained. Chest x-ray did not show any infiltrates. CT head without contrast showed no acute intracranial abnormality. CT abdomen and pelvis, CT chest without any abnormalities.  Patient was found to have severe sepsis from Salmonella enteritis bacteremia and gastroenteritis.  She was treated inpatient with ceftriaxone with significant improvement in her symptoms.  She was also found to be in AKI with multiple electrolyte derangements which  improved significantly and resolved with IV fluids.   After discussion with infectious disease, she was discharged on ampicillin 1000 mg in a.m. and 500 mg in p.m. for 2 more days for total 10 days antibiotics.  A & P   Principal Problem:   Acute renal failure (ARF) (HCC) Active Problems:   Lactic acidosis   Metabolic acidosis   Acute metabolic encephalopathy   Viral gastroenteritis   AKI (acute kidney injury) ([place])  ## Prerenal Acute renal failure in setting of severe sepsis as below Resolved with IV fluids -avoid nephrotoxic agents -Cr 6.57 on admission -Renal ultrasound demonstrating no acute hydronephrosis or obstructive uropathy. -Follow-up lab work outpatient  ##Severe sepsisfrom Salmonella enteritidis bacteremia, gastroenteritis Admitted with elevated lactic acid of 7, elevated white blood cell count of 25,000, tachycardia.  afebrile and hemodynamically stable without leukocytosis, resolved Pleated 8 days antibiotics was notably Rocephin with improvement C. difficile toxin negative -Discussion with ID, patient to be discharged with ampicillin 1000 mg in a.m. and 500 mg in p.m. for total 10 days antibiotics (2 more days)  ##acute metabolic encephalopathy, resolved Secondary to uremia and electrolyte imbalances, sepsis Daughter reports patient is mainly nonverbal at baseline but typically only says "help me" at home, this was observed today and patient appears at baseline Patient with underlyingAdvanceddementia -Hold any sedating medications -Follow-up outpatient  Salmonella bacteremia Day 8/14 antibiotics blood cultures x2 positive for Salmonella enteritidis -continue with antibiotics as above and monitor for C. difficile  ##Acute gastroenteritis C difficile toxin-negative -antibiotics as above  ##UGI bleed -Hb stable between 10-11, no reported bloody BM -Patient had one episode of coffee-ground emesis - p.o. Protonix twice daily -follow-up with  CBC  ##Hyperkalemia -From metabolic acidosis -resolved  ##Hypokalemia -resolved  ##  Hypomagnesemia -Repleted -Discharged with Mag-Ox daily and follow-up magnesium level in 1 week  ##Severe dehydration -Secondary to decreased p.o. intake, diarrhea -resolved  ##dementia without behavioral disorder -Continue Namenda -Continue supportive care. -mostly nonverbal at baseline per family -Mirtazapine and trazodone discontinued  ##depression and poor oral intake  ##Diabetes mellitus -Glycemic on admission due to decreased p.o. intake and insulin -Hemoglobin A1c 6.7, continue home regimen and consider reduced insulin regimen and HA1C goal between 7-8 in elderly frail patient to prevent further hypoglycemic episodes   Consultants  . Nephrology  Procedures  . None  Antibiotics   Anti-infectives (From admission, onward)   Start     Dose/Rate Route Frequency Ordered Stop   10/21/19 0000  ampicillin (PRINCIPEN) 500 MG capsule        10/21/19 1016     10/17/19 1000  cefTRIAXone (ROCEPHIN) 2 g in sodium chloride 0.9 % 100 mL IVPB     2 g 200 mL/hr over 30 Minutes Intravenous Every 24 hours 10/16/19 1031     10/15/19 1000  cefTRIAXone (ROCEPHIN) 1 g in sodium chloride 0.9 % 100 mL IVPB  Status:  Discontinued     1 g 200 mL/hr over 30 Minutes Intravenous Every 24 hours 10/15/19 0957 10/16/19 1031   10/15/19 1000  metroNIDAZOLE (FLAGYL) IVPB 500 mg  Status:  Discontinued     500 mg 100 mL/hr over 60 Minutes Intravenous Every 8 hours 10/15/19 0957 10/17/19 1636   10/14/19 0030  meropenem (MERREM) 500 mg in sodium chloride 0.9 % 100 mL IVPB  Status:  Discontinued     500 mg 200 mL/hr over 30 Minutes Intravenous Daily at bedtime 10/13/19 2354 10/14/19 1753   10/13/19 2300  cefTRIAXone (ROCEPHIN) 2 g in sodium chloride 0.9 % 100 mL IVPB  Status:  Discontinued     2 g 200 mL/hr over 30 Minutes Intravenous Every 24 hours 10/13/19 2238 10/13/19 2344   10/13/19 2130  aztreonam  (AZACTAM) 2 g in sodium chloride 0.9 % 100 mL IVPB  Status:  Discontinued     2 g 200 mL/hr over 30 Minutes Intravenous  Once 10/13/19 2116 10/13/19 2137   10/13/19 2130  metroNIDAZOLE (FLAGYL) IVPB 500 mg     500 mg 100 mL/hr over 60 Minutes Intravenous  Once 10/13/19 2116 10/14/19 0023        Subjective  Patient seen and examined at bedside no acute distress resting comfortably.  She is essentially nonverbal at baseline from advanced dementia and unable to provide a history.  Appears at baseline   Objective   Discharge Exam: Vitals:   10/21/19 0411 10/21/19 0823  BP: (!) 177/84 (!) 154/76  Pulse: 90 84  Resp: 17 18  Temp: 98.5 F (36.9 C) 98.5 F (36.9 C)  SpO2: 97% 99%   Vitals:   10/20/19 1945 10/20/19 2313 10/21/19 0411 10/21/19 0823  BP: (!) 142/61 (!) 159/75 (!) 177/84 (!) 154/76  Pulse: 89 84 90 84  Resp: 17 16 17 18   Temp: 98.2 F (36.8 C) 98.7 F (37.1 C) 98.5 F (36.9 C) 98.5 F (36.9 C)  TempSrc: Oral Oral Oral Oral  SpO2: 99% 100% 97% 99%  Weight:      Height:        Physical Exam Constitutional:      General: She is not in acute distress.    Comments: Frail elderly female nonverbal  HENT:     Head: Normocephalic and atraumatic.  Cardiovascular:     Rate and Rhythm:  Normal rate and regular rhythm.  Pulmonary:     Effort: Pulmonary effort is normal.     Breath sounds: Normal breath sounds.  Abdominal:     General: Abdomen is flat.     Palpations: Abdomen is soft.  Musculoskeletal:     Right lower leg: No edema.     Left lower leg: No edema.  Neurological:     Mental Status: She is alert. Mental status is at baseline.       The results of significant diagnostics from this hospitalization (including imaging, microbiology, ancillary and laboratory) are listed below for reference.     Microbiology: Recent Results (from the past 240 hour(s))  Blood Culture (routine x 2)     Status: Abnormal (Preliminary result)   Collection Time:  10/13/19 10:00 PM   Specimen: BLOOD RIGHT WRIST  Result Value Ref Range Status   Specimen Description BLOOD RIGHT WRIST  Final   Special Requests   Final    BOTTLES DRAWN AEROBIC AND ANAEROBIC Blood Culture results may not be optimal due to an inadequate volume of blood received in culture bottles   Culture  Setup Time   Final    GRAM NEGATIVE RODS AEROBIC BOTTLE ONLY CRITICAL RESULT CALLED TO, READ BACK BY AND VERIFIED WITH: Hughie Closs PharmD 10:30 10/16/19 ([email or web address])    Culture (A)  Final    [place] NOTIFIED REFERRED TO [place] IN [place] IDENTIFICATION/CONFIRMATION Performed at [facility], [place] [address] [place], [place] [address]    Report Status PENDING  Incomplete   Organism ID, Bacteria SALMONELLA ENTERITIDIS  Final      Susceptibility   Salmonella enteritidis - MIC*    AMPICILLIN <=2 SENSITIVE Sensitive     LEVOFLOXACIN <=0.12 SENSITIVE Sensitive     TRIMETH/SULFA <=20 SENSITIVE Sensitive     * SALMONELLA ENTERITIDIS  Blood Culture (routine x 2)     Status: Abnormal   Collection Time: 10/13/19 10:00 PM   Specimen: BLOOD  Result Value Ref Range Status   Specimen Description BLOOD LEFT ANKLE  Final   Special Requests   Final    BOTTLES DRAWN AEROBIC ONLY Blood Culture results may not be optimal due to an inadequate volume of blood received in culture bottles   Culture  Setup Time   Final    GRAM NEGATIVE RODS AEROBIC BOTTLE ONLY CRITICAL VALUE NOTED.  VALUE IS CONSISTENT WITH PREVIOUSLY REPORTED AND CALLED VALUE.    Culture (A)  Final    SALMONELLA ENTERITIDIS SUSCEPTIBILITIES PERFORMED ON PREVIOUS CULTURE WITHIN THE LAST 5 DAYS. HEALTH DEPARTMENT NOTIFIED Performed at [facility], [place] [address] [place], [place] [address]    Report Status 10/20/2019 FINAL  Final  Blood Culture ID Panel (Reflexed)     Status: Abnormal   Collection Time: 10/13/19 10:00 PM  Result Value Ref  Range Status   Enterococcus species NOT DETECTED NOT DETECTED Final   Listeria monocytogenes NOT DETECTED NOT DETECTED Final   Staphylococcus species NOT DETECTED NOT DETECTED Final   Staphylococcus aureus (BCID) NOT DETECTED NOT DETECTED Final   Streptococcus species NOT DETECTED NOT DETECTED Final   Streptococcus agalactiae NOT DETECTED NOT DETECTED Final   Streptococcus pneumoniae NOT DETECTED NOT DETECTED Final   Streptococcus pyogenes NOT DETECTED NOT DETECTED Final   Acinetobacter baumannii NOT DETECTED NOT DETECTED Final   Enterobacteriaceae species DETECTED (A) NOT DETECTED Final    Comment: Enterobacteriaceae represent a large family of  gram negative bacteria, not a single organism. Refer to culture for further identification. CRITICAL RESULT CALLED TO, READ BACK BY AND VERIFIED WITH: Hughie Closs PharmD 10:30 10/16/19 ([email or web address])    Enterobacter cloacae complex NOT DETECTED NOT DETECTED Final   Escherichia coli NOT DETECTED NOT DETECTED Final   Klebsiella oxytoca NOT DETECTED NOT DETECTED Final   Klebsiella pneumoniae NOT DETECTED NOT DETECTED Final   Proteus species NOT DETECTED NOT DETECTED Final   Serratia marcescens NOT DETECTED NOT DETECTED Final   Carbapenem resistance NOT DETECTED NOT DETECTED Final   Haemophilus influenzae NOT DETECTED NOT DETECTED Final   Neisseria meningitidis NOT DETECTED NOT DETECTED Final   Pseudomonas aeruginosa NOT DETECTED NOT DETECTED Final   Candida albicans NOT DETECTED NOT DETECTED Final   Candida glabrata NOT DETECTED NOT DETECTED Final   Candida krusei NOT DETECTED NOT DETECTED Final   Candida parapsilosis NOT DETECTED NOT DETECTED Final   Candida tropicalis NOT DETECTED NOT DETECTED Final    Comment: Performed at [facility], [place] [address], [place], [place] [address]  SARS CORONAVIRUS 2 (TAT 6-24 HRS) Nasopharyngeal Nasopharyngeal Swab     Status: None   Collection Time: 10/13/19 10:59 PM   Specimen: Nasopharyngeal Swab  Result  Value Ref Range Status   SARS Coronavirus 2 NEGATIVE NEGATIVE Final    Comment: (NOTE) SARS-CoV-2 target nucleic acids are NOT DETECTED. The SARS-CoV-2 RNA is generally detectable in upper and lower respiratory specimens during the acute phase of infection. Negative results do not preclude SARS-CoV-2 infection, do not rule out co-infections with other pathogens, and should not be used as the sole basis for treatment or other patient management decisions. Negative results must be combined with clinical observations, patient history, and epidemiological information. The expected result is Negative. Fact Sheet for Patients: SugarRoll.be Fact Sheet for Healthcare Providers: https://www.woods-mathews.com/ This test is not yet approved or cleared by the [place] FDA and  has been authorized for detection and/or diagnosis of SARS-CoV-2 by FDA under an Emergency Use Authorization (EUA). This EUA will remain  in effect (meaning this test can be used) for the duration of the COVID-19 declaration under Section 56 4(b)(1) of the Act, 21 U.S.C. section 360bbb-3(b)(1), unless the authorization is terminated or revoked sooner. Performed at [facility], [place] [address], [place], [place] [address]   Gastrointestinal Panel by PCR , Stool     Status: Abnormal   Collection Time: 10/14/19  4:01 AM   Specimen: Stool  Result Value Ref Range Status   Campylobacter species NOT DETECTED NOT DETECTED Final   Plesimonas shigelloides NOT DETECTED NOT DETECTED Final   Salmonella species DETECTED (A) NOT DETECTED Final    Comment: RESULT CALLED TO, READ BACK BY AND VERIFIED WITH: HEATHER HUNTER 10/15/19 1339 KLW    Yersinia enterocolitica NOT DETECTED NOT DETECTED Final   Vibrio species NOT DETECTED NOT DETECTED Final   Vibrio cholerae NOT DETECTED NOT DETECTED Final   Enteroaggregative E coli (EAEC) NOT DETECTED NOT DETECTED Final   Enteropathogenic E  coli (EPEC) NOT DETECTED NOT DETECTED Final   Enterotoxigenic E coli (ETEC) NOT DETECTED NOT DETECTED Final   Shiga like toxin producing E coli (STEC) NOT DETECTED NOT DETECTED Final   Shigella/Enteroinvasive E coli (EIEC) NOT DETECTED NOT DETECTED Final   Cryptosporidium NOT DETECTED NOT DETECTED Final   Cyclospora cayetanensis NOT DETECTED NOT DETECTED Final   Entamoeba histolytica NOT DETECTED NOT DETECTED Final   Giardia lamblia NOT DETECTED NOT DETECTED Final  Adenovirus F40/41 NOT DETECTED NOT DETECTED Final   Astrovirus NOT DETECTED NOT DETECTED Final   Norovirus GI/GII NOT DETECTED NOT DETECTED Final   Rotavirus A NOT DETECTED NOT DETECTED Final   Sapovirus (I, II, IV, and V) NOT DETECTED NOT DETECTED Final    Comment: Performed at [facility], [place], [address] [place], [place] [address]  C difficile quick scan w PCR reflex     Status: None   Collection Time: 10/14/19  4:01 AM   Specimen: Stool  Result Value Ref Range Status   C Diff antigen NEGATIVE NEGATIVE Final   C Diff toxin NEGATIVE NEGATIVE Final   C Diff interpretation No C. difficile detected.  Final    Comment: Performed at [facility], [place] [address] [place], [place] [address]  Urine culture     Status: Abnormal   Collection Time: 10/17/19 11:47 AM   Specimen: In/Out Cath Urine  Result Value Ref Range Status   Specimen Description IN/OUT CATH URINE  Final   Special Requests NONE  Final   Culture (A)  Final    <10,000 COLONIES/mL INSIGNIFICANT GROWTH Performed at [facility], [place] [address] [place], [place] [address]    Report Status 10/18/2019 FINAL  Final  MRSA PCR Screening     Status: None   Collection Time: 10/17/19 11:47 AM   Specimen: Nasal Mucosa; Nasopharyngeal  Result Value Ref Range Status   MRSA by PCR NEGATIVE NEGATIVE Final    Comment:        The GeneXpert MRSA Assay (FDA approved for NASAL specimens only), is one component of a comprehensive MRSA  colonization surveillance program. It is not intended to diagnose MRSA infection nor to guide or monitor treatment for MRSA infections. Performed at [facility], [place] [address], [place], [place] [address]   SARS CORONAVIRUS 2 (TAT 6-24 HRS) Nasopharyngeal Nasopharyngeal Swab     Status: None   Collection Time: 10/20/19  8:29 AM   Specimen: Nasopharyngeal Swab  Result Value Ref Range Status   SARS Coronavirus 2 NEGATIVE NEGATIVE Final    Comment: (NOTE) SARS-CoV-2 target nucleic acids are NOT DETECTED. The SARS-CoV-2 RNA is generally detectable in upper and lower respiratory specimens during the acute phase of infection. Negative results do not preclude SARS-CoV-2 infection, do not rule out co-infections with other pathogens, and should not be used as the sole basis for treatment or other patient management decisions. Negative results must be combined with clinical observations, patient history, and epidemiological information. The expected result is Negative. Fact Sheet for Patients: SugarRoll.be Fact Sheet for Healthcare Providers: https://www.woods-mathews.com/ This test is not yet approved or cleared by the [place] FDA and  has been authorized for detection and/or diagnosis of SARS-CoV-2 by FDA under an Emergency Use Authorization (EUA). This EUA will remain  in effect (meaning this test can be used) for the duration of the COVID-19 declaration under Section 56 4(b)(1) of the Act, 21 U.S.C. section 360bbb-3(b)(1), unless the authorization is terminated or revoked sooner. Performed at [facility], [place] [address], [place], [place] [address]      Labs: BNP (last 3 results) No results for input(s): BNP in the last 8760 hours. Basic Metabolic Panel: Recent Labs  Lab 10/16/19 0727 10/17/19 0247 10/18/19 0330 10/19/19 0500 10/20/19 0500 10/21/19 0521  NA 148* 150* 138 142 141 143  K 2.4* 3.1* 3.1* 3.5 4.1 3.7   CL 86* 91* 92* 106 104 110  CO2  43* 45* 33* 28 24 19*  GLUCOSE 148* 186* 254* 148* 126* 166*  BUN 57* 33* 13 9 10  6*  CREATININE 2.31* 1.28* 0.94 0.78 0.74 0.75  CALCIUM 6.9* 7.0* 7.1* 7.5* 7.8* 8.4*  MG 2.5*  --  1.2* 2.0 1.6* 1.4*   Liver Function Tests: No results for input(s): AST, ALT, ALKPHOS, BILITOT, PROT, ALBUMIN in the last 168 hours. No results for input(s): LIPASE, AMYLASE in the last 168 hours. No results for input(s): AMMONIA in the last 168 hours. CBC: Recent Labs  Lab 10/17/19 0247 10/18/19 1007 10/19/19 0500 10/20/19 0500 10/21/19 0521  WBC 7.0 14.7* 11.4* 8.7 6.6  HGB 10.8* 11.4* 10.3* 10.0* 10.4*  HCT 33.3* 35.2* 31.9* 31.1* 32.9*  MCV 98.2 98.9 98.5 99.0 99.7  PLT PLATELET CLUMPS NOTED ON SMEAR, UNABLE TO ESTIMATE 145* 154 174 202   Cardiac Enzymes: No results for input(s): CKTOTAL, CKMB, CKMBINDEX, TROPONINI in the last 168 hours. BNP: Invalid input(s): POCBNP CBG: Recent Labs  Lab 10/20/19 0627 10/20/19 1146 10/20/19 1628 10/20/19 2107 10/21/19 0612  GLUCAP 123* 126* 173* 195* 146*   D-Dimer No results for input(s): DDIMER in the last 72 hours. Hgb A1c No results for input(s): HGBA1C in the last 72 hours. Lipid Profile No results for input(s): CHOL, HDL, LDLCALC, TRIG, CHOLHDL, LDLDIRECT in the last 72 hours. Thyroid function studies No results for input(s): TSH, T4TOTAL, T3FREE, THYROIDAB in the last 72 hours.  Invalid input(s): FREET3 Anemia work up No results for input(s): VITAMINB12, FOLATE, FERRITIN, TIBC, IRON, RETICCTPCT in the last 72 hours. Urinalysis    Component Value Date/Time   COLORURINE YELLOW 10/17/2019 [place] 10/17/2019 1206   LABSPEC 1.010 10/17/2019 1206   PHURINE 9.0 (H) 10/17/2019 1206   GLUCOSEU NEGATIVE 10/17/2019 1206   HGBUR NEGATIVE 10/17/2019 1206   BILIRUBINUR NEGATIVE 10/17/2019 1206   KETONESUR 5 (A) 10/17/2019 1206   PROTEINUR 100 (A) 10/17/2019 1206   UROBILINOGEN 0.2 01/23/2014  2343   NITRITE NEGATIVE 10/17/2019 1206   LEUKOCYTESUR NEGATIVE 10/17/2019 1206   Sepsis Labs Invalid input(s): PROCALCITONIN,  WBC,  LACTICIDVEN Microbiology Recent Results (from the past 240 hour(s))  Blood Culture (routine x 2)     Status: Abnormal (Preliminary result)   Collection Time: 10/13/19 10:00 PM   Specimen: BLOOD RIGHT WRIST  Result Value Ref Range Status   Specimen Description BLOOD RIGHT WRIST  Final   Special Requests   Final    BOTTLES DRAWN AEROBIC AND ANAEROBIC Blood Culture results may not be optimal due to an inadequate volume of blood received in culture bottles   Culture  Setup Time   Final    GRAM NEGATIVE RODS AEROBIC BOTTLE ONLY CRITICAL RESULT CALLED TO, READ BACK BY AND VERIFIED WITH: Hughie Closs PharmD 10:30 10/16/19 ([email or web address])    Culture (A)  Final    [place] NOTIFIED REFERRED TO [place] IN [place] IDENTIFICATION/CONFIRMATION Performed at [facility], [place] [address] [place], [place] [address]    Report Status PENDING  Incomplete   Organism ID, Bacteria SALMONELLA ENTERITIDIS  Final      Susceptibility   Salmonella enteritidis - MIC*    AMPICILLIN <=2 SENSITIVE Sensitive     LEVOFLOXACIN <=0.12 SENSITIVE Sensitive     TRIMETH/SULFA <=20 SENSITIVE Sensitive     * SALMONELLA ENTERITIDIS  Blood Culture (routine x 2)     Status: Abnormal   Collection Time: 10/13/19 10:00 PM  Specimen: BLOOD  Result Value Ref Range Status   Specimen Description BLOOD LEFT ANKLE  Final   Special Requests   Final    BOTTLES DRAWN AEROBIC ONLY Blood Culture results may not be optimal due to an inadequate volume of blood received in culture bottles   Culture  Setup Time   Final    GRAM NEGATIVE RODS AEROBIC BOTTLE ONLY CRITICAL VALUE NOTED.  VALUE IS CONSISTENT WITH PREVIOUSLY REPORTED AND CALLED VALUE.    Culture (A)  Final    SALMONELLA ENTERITIDIS SUSCEPTIBILITIES PERFORMED ON  PREVIOUS CULTURE WITHIN THE LAST 5 DAYS. HEALTH DEPARTMENT NOTIFIED Performed at [facility], [place] [address], [place], [place] [address]    Report Status 10/20/2019 FINAL  Final  Blood Culture ID Panel (Reflexed)     Status: Abnormal   Collection Time: 10/13/19 10:00 PM  Result Value Ref Range Status   Enterococcus species NOT DETECTED NOT DETECTED Final   Listeria monocytogenes NOT DETECTED NOT DETECTED Final   Staphylococcus species NOT DETECTED NOT DETECTED Final   Staphylococcus aureus (BCID) NOT DETECTED NOT DETECTED Final   Streptococcus species NOT DETECTED NOT DETECTED Final   Streptococcus agalactiae NOT DETECTED NOT DETECTED Final   Streptococcus pneumoniae NOT DETECTED NOT DETECTED Final   Streptococcus pyogenes NOT DETECTED NOT DETECTED Final   Acinetobacter baumannii NOT DETECTED NOT DETECTED Final   Enterobacteriaceae species DETECTED (A) NOT DETECTED Final    Comment: Enterobacteriaceae represent a large family of gram negative bacteria, not a single organism. Refer to culture for further identification. CRITICAL RESULT CALLED TO, READ BACK BY AND VERIFIED WITH: Hughie Closs PharmD 10:30 10/16/19 ([email or web address])    Enterobacter cloacae complex NOT DETECTED NOT DETECTED Final   Escherichia coli NOT DETECTED NOT DETECTED Final   Klebsiella oxytoca NOT DETECTED NOT DETECTED Final   Klebsiella pneumoniae NOT DETECTED NOT DETECTED Final   Proteus species NOT DETECTED NOT DETECTED Final   Serratia marcescens NOT DETECTED NOT DETECTED Final   Carbapenem resistance NOT DETECTED NOT DETECTED Final   Haemophilus influenzae NOT DETECTED NOT DETECTED Final   Neisseria meningitidis NOT DETECTED NOT DETECTED Final   Pseudomonas aeruginosa NOT DETECTED NOT DETECTED Final   Candida albicans NOT DETECTED NOT DETECTED Final   Candida glabrata NOT DETECTED NOT DETECTED Final   Candida krusei NOT DETECTED NOT DETECTED Final   Candida parapsilosis NOT DETECTED NOT DETECTED Final   Candida  tropicalis NOT DETECTED NOT DETECTED Final    Comment: Performed at [facility], [place] [address], [place], [place] [address]  SARS CORONAVIRUS 2 (TAT 6-24 HRS) Nasopharyngeal Nasopharyngeal Swab     Status: None   Collection Time: 10/13/19 10:59 PM   Specimen: Nasopharyngeal Swab  Result Value Ref Range Status   SARS Coronavirus 2 NEGATIVE NEGATIVE Final    Comment: (NOTE) SARS-CoV-2 target nucleic acids are NOT DETECTED. The SARS-CoV-2 RNA is generally detectable in upper and lower respiratory specimens during the acute phase of infection. Negative results do not preclude SARS-CoV-2 infection, do not rule out co-infections with other pathogens, and should not be used as the sole basis for treatment or other patient management decisions. Negative results must be combined with clinical observations, patient history, and epidemiological information. The expected result is Negative. Fact Sheet for Patients: SugarRoll.be Fact Sheet for Healthcare Providers: https://www.woods-mathews.com/ This test is not yet approved or cleared by the [place] FDA and  has been authorized for detection and/or diagnosis of SARS-CoV-2 by FDA under an Emergency Use  Authorization (EUA). This EUA will remain  in effect (meaning this test can be used) for the duration of the COVID-19 declaration under Section 56 4(b)(1) of the Act, 21 U.S.C. section 360bbb-3(b)(1), unless the authorization is terminated or revoked sooner. Performed at [facility], [place] [address], [place], [place] [address]   Gastrointestinal Panel by PCR , Stool     Status: Abnormal   Collection Time: 10/14/19  4:01 AM   Specimen: Stool  Result Value Ref Range Status   Campylobacter species NOT DETECTED NOT DETECTED Final   Plesimonas shigelloides NOT DETECTED NOT DETECTED Final   Salmonella species DETECTED (A) NOT DETECTED Final    Comment: RESULT CALLED TO, READ BACK BY AND  VERIFIED WITH: HEATHER HUNTER 10/15/19 1339 KLW    Yersinia enterocolitica NOT DETECTED NOT DETECTED Final   Vibrio species NOT DETECTED NOT DETECTED Final   Vibrio cholerae NOT DETECTED NOT DETECTED Final   Enteroaggregative E coli (EAEC) NOT DETECTED NOT DETECTED Final   Enteropathogenic E coli (EPEC) NOT DETECTED NOT DETECTED Final   Enterotoxigenic E coli (ETEC) NOT DETECTED NOT DETECTED Final   Shiga like toxin producing E coli (STEC) NOT DETECTED NOT DETECTED Final   Shigella/Enteroinvasive E coli (EIEC) NOT DETECTED NOT DETECTED Final   Cryptosporidium NOT DETECTED NOT DETECTED Final   Cyclospora cayetanensis NOT DETECTED NOT DETECTED Final   Entamoeba histolytica NOT DETECTED NOT DETECTED Final   Giardia lamblia NOT DETECTED NOT DETECTED Final   Adenovirus F40/41 NOT DETECTED NOT DETECTED Final   Astrovirus NOT DETECTED NOT DETECTED Final   Norovirus GI/GII NOT DETECTED NOT DETECTED Final   Rotavirus A NOT DETECTED NOT DETECTED Final   Sapovirus (I, II, IV, and V) NOT DETECTED NOT DETECTED Final    Comment: Performed at [facility], [address] [place], [place] [address]  C difficile quick scan w PCR reflex     Status: None   Collection Time: 10/14/19  4:01 AM   Specimen: Stool  Result Value Ref Range Status   C Diff antigen NEGATIVE NEGATIVE Final   C Diff toxin NEGATIVE NEGATIVE Final   C Diff interpretation No C. difficile detected.  Final    Comment: Performed at [facility], [place] [address], [place], [place] [address]  Urine culture     Status: Abnormal   Collection Time: 10/17/19 11:47 AM   Specimen: In/Out Cath Urine  Result Value Ref Range Status   Specimen Description IN/OUT CATH URINE  Final   Special Requests NONE  Final   Culture (A)  Final    <10,000 COLONIES/mL INSIGNIFICANT GROWTH Performed at [facility], [place] [address] [place], [place] [address]    Report Status 10/18/2019 FINAL  Final  MRSA PCR Screening     Status: None    Collection Time: 10/17/19 11:47 AM   Specimen: Nasal Mucosa; Nasopharyngeal  Result Value Ref Range Status   MRSA by PCR NEGATIVE NEGATIVE Final    Comment:        The GeneXpert MRSA Assay (FDA approved for NASAL specimens only), is one component of a comprehensive MRSA colonization surveillance program. It is not intended to diagnose MRSA infection nor to guide or monitor treatment for MRSA infections. Performed at [facility], [place] [address] [place], [place] [address]   SARS CORONAVIRUS 2 (TAT 6-24 HRS) Nasopharyngeal Nasopharyngeal Swab     Status: None   Collection Time: 10/20/19  8:29 AM   Specimen: Nasopharyngeal  Swab  Result Value Ref Range Status   SARS Coronavirus 2 NEGATIVE NEGATIVE Final    Comment: (NOTE) SARS-CoV-2 target nucleic acids are NOT DETECTED. The SARS-CoV-2 RNA is generally detectable in upper and lower respiratory specimens during the acute phase of infection. Negative results do not preclude SARS-CoV-2 infection, do not rule out co-infections with other pathogens, and should not be used as the sole basis for treatment or other patient management decisions. Negative results must be combined with clinical observations, patient history, and epidemiological information. The expected result is Negative. Fact Sheet for Patients: SugarRoll.be Fact Sheet for Healthcare Providers: https://www.woods-mathews.com/ This test is not yet approved or cleared by the [place] FDA and  has been authorized for detection and/or diagnosis of SARS-CoV-2 by FDA under an Emergency Use Authorization (EUA). This EUA will remain  in effect (meaning this test can be used) for the duration of the COVID-19 declaration under Section 56 4(b)(1) of the Act, 21 U.S.C. section 360bbb-3(b)(1), unless the authorization is terminated or revoked sooner. 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 -Protonix 40 mg daily 30 mins prior to meals -Mag Ox 200 mg daily -Amlodipine 5 mg daily -Follow up CBC/BMP/Mg in one week -Consider decreasing/stopping insulin dose and allowing for Ha1c 7-8 in elderly patient who had a hypoglycemic episode when not eating   Increase activity slowly   Complete by: As directed      Allergies as of 10/21/2019      Reactions   Lisinopril Other (See Comments)   Reaction:  Respiratory arrest    Spironolactone Anaphylaxis   Vancomycin Anaphylaxis, Rash   Donepezil Diarrhea   Eggs Or Egg-derived Products Diarrhea   Erythromycin Other (See Comments)   Reaction:  Makes pt feel weird    Macrolides And Ketolides Other (See Comments)   Reaction:  Unknown    Penicillins Itching, Other (See Comments)   Has patient had a PCN reaction causing immediate rash, facial/tongue/throat swelling, SOB or lightheadedness with hypotension: No Has patient had a PCN reaction causing severe rash involving mucus membranes or skin necrosis: No Has patient had a PCN reaction that required hospitalization No Has patient had a PCN reaction occurring within the last 10 years: No If all of the above answers are "NO", then may proceed with Cephalosporin use.   Quinapril Hcl Itching   Angiotensin Receptor Blockers Other (See Comments)   Reaction:  Unknown    Lipitor [atorvastatin] Palpitations   Pentazocine Lactate Other (See Comments)   Reaction:  Unknown    Propoxyphene Hcl Other (See Comments)   Reaction unknown   Sulfonamide Derivatives Other (See Comments)   Reaction:  Unknown       Medication List    STOP taking these medications   Imodium A-D 2 MG capsule Generic drug:  loperamide   mirtazapine 7.5 MG tablet Commonly known as: REMERON   traZODone 50 MG tablet Commonly known as: DESYREL     TAKE these medications   amLODipine 5 MG tablet Commonly known as: NORVASC Take 1 tablet (5 mg total) by mouth daily.   ampicillin  500 MG capsule Commonly known as: PRINCIPEN 1000 mg in AM 500 mg in PM   aspirin EC 81 MG tablet Take 81 mg by mouth daily.   atorvastatin 40 MG tablet Commonly known as: LIPITOR Take 40 mg by mouth daily.   clopidogrel 75 MG tablet Commonly known as: PLAVIX Take 75 mg by mouth daily.   D3-1000 25 MCG (1000 UT) tablet Generic drug: Cholecalciferol Take 6,000 Units by mouth daily.   furosemide 20 MG tablet Commonly known as: LASIX Take 20 mg by mouth daily.   HumaLOG KwikPen 100 UNIT/ML KiwkPen Generic drug: insulin lispro Inject 4 Units into the skin 3 (three) times daily as needed (CBG >300).   insulin glargine 100 UNIT/ML injection Commonly known as: LANTUS Inject 7 Units into the skin at bedtime.   isosorbide mononitrate 30 MG 24 hr tablet Commonly known as: IMDUR Take 0.5 tablets (15 mg total) by mouth daily.   Mag-Oxide 200 MG Tabs Generic drug: Magnesium Oxide Take 1 tablet (200 mg total) by mouth daily.   memantine 10 MG tablet Commonly known as: NAMENDA Take 10 mg by mouth 2 (two) times daily.   metoprolol succinate 25 MG 24 hr tablet Commonly known as: Toprol XL Take 0.5 tablets (12.5 mg total) by mouth daily.   mometasone-formoterol 100-5 MCG/ACT Aero Commonly known as: DULERA Inhale 2 puffs into the lungs 2 (two) times daily.   NUTRITIONAL SUPPLEMENTS PO Take 1 each by mouth See admin instructions. Magic Cup with lunch and dinner meal   pantoprazole 40 MG tablet Commonly known as: PROTONIX Take 1 tablet (40 mg total) by mouth 2 (two) times daily. What changed: when to take this   sennosides-docusate sodium 8.6-50 MG tablet Commonly known as: SENOKOT-S Take 2 tablets by mouth  daily.   traMADol 50 MG tablet Commonly known as: ULTRAM Take 1 tablet (50 mg total) by mouth 2 (two) times daily. Back pain       Allergies  Allergen Reactions  . Lisinopril Other (See Comments)    Reaction:  Respiratory arrest   . Spironolactone Anaphylaxis  . Vancomycin Anaphylaxis and Rash  . Donepezil Diarrhea  . Eggs Or Egg-Derived Products Diarrhea  . Erythromycin Other (See Comments)    Reaction:  Makes pt feel weird   . Macrolides And Ketolides Other (See Comments)    Reaction:  Unknown   . Penicillins Itching and Other (See Comments)    Has patient had a PCN reaction causing immediate rash, facial/tongue/throat swelling, SOB or lightheadedness with hypotension: No Has patient had a PCN reaction causing severe rash involving mucus membranes or skin necrosis: No Has patient had a PCN reaction that required hospitalization No Has patient had a PCN reaction occurring within the last 10 years: No If all of the above answers are "NO", then may proceed with Cephalosporin use.  . Quinapril Hcl Itching  . Angiotensin Receptor Blockers Other (See Comments)    Reaction:  Unknown   . Lipitor [Atorvastatin] Palpitations  . Pentazocine Lactate Other (See Comments)    Reaction:  Unknown   . Propoxyphene Hcl Other (See Comments)    Reaction unknown  . Sulfonamide Derivatives Other (See Comments)    Reaction:  Unknown     Time coordinating discharge: Over 30 minutes   SIGNED:   Harold Hedge, D.O. [facility] Pager: [phone]  10/21/2019, 10:18 AM

## 2019-10-21 NOTE — Progress Notes (Signed)
Patient being transferred to [facility].  Patient to be transported by [facility].  Midline removed with the catheter intact.  Discharge instructions and prescription information placed in the patient's packet for transport.  Report called to [facility] and given to [facility].

## 2019-10-21 NOTE — Social Work (Signed)
Clinical Social Worker facilitated patient discharge including contacting patient family and facility to confirm patient discharge plans.  Clinical information faxed to facility and family agreeable with plan.  CSW arranged ambulance transport via [facility] to [facility].  RN to call [phone]  with report prior to discharge.  Clinical Social Worker will sign off for now as social work intervention is no longer needed. Please consult [place] again if new need arises.  Westley Hummer, MSW, [place] Social Worker [phone]

## 2019-10-21 NOTE — Plan of Care (Signed)
Patient progressing towards plan of care goals. 

## 2019-10-23 ENCOUNTER — Non-Acute Institutional Stay (SKILLED_NURSING_FACILITY): Payer: Medicare Other | Admitting: Internal Medicine

## 2019-10-23 ENCOUNTER — Encounter: Payer: Self-pay | Admitting: Internal Medicine

## 2019-10-23 DIAGNOSIS — I25118 Atherosclerotic heart disease of native coronary artery with other forms of angina pectoris: Secondary | ICD-10-CM | POA: Diagnosis not present

## 2019-10-23 DIAGNOSIS — I10 Essential (primary) hypertension: Secondary | ICD-10-CM

## 2019-10-23 DIAGNOSIS — Z794 Long term (current) use of insulin: Secondary | ICD-10-CM

## 2019-10-23 DIAGNOSIS — F0150 Vascular dementia without behavioral disturbance: Secondary | ICD-10-CM | POA: Diagnosis not present

## 2019-10-23 DIAGNOSIS — A021 Salmonella sepsis: Secondary | ICD-10-CM

## 2019-10-23 DIAGNOSIS — I5031 Acute diastolic (congestive) heart failure: Secondary | ICD-10-CM

## 2019-10-23 DIAGNOSIS — E1159 Type 2 diabetes mellitus with other circulatory complications: Secondary | ICD-10-CM

## 2019-10-23 NOTE — Progress Notes (Signed)
Location:  [facility] Room Number: [id number] Place of Service:  SNF [phone]) Provider:  Granville Lewis, PA-C  Patient Care Team: Hennie Duos, MD as PCP - General (Internal Medicine)  Extended Emergency Contact Information Primary Emergency Contact: [name],[name] Address: [address]          [place], [place] [address] [name] of [place] Phone: [phone] Work Phone: [phone] Mobile Phone: [phone] Relation: Daughter Secondary Emergency Contact: [name],[name]  [place] Phone: [phone] Relation: Niece  Code Status:  DNR Goals of care: Advanced Directive information Advanced Directives 10/23/2019  Does Patient Have a Medical Advance Directive? Yes  Type of Advance Directive Out of facility DNR (pink MOST or yellow form)  Does patient want to make changes to medical advance directive? No - Patient declined  Copy of [place] in Chart? -  Would patient like information on creating a medical advance directive? -  Pre-existing out of facility DNR order (yellow form or pink MOST form) -     Chief Complaint  Patient presents with   Hospitalization Follow-up    Patient is seen for hospital followup, status post hospitalization for Salmonella enteritis.     HPI:  Pt is a 78 y.o. female seen today for hospital followup.  Status post hospitalization for sepsis.  Patient is a long-term resident of the facility who was sent to the ER secondary to increased weakness with vomiting and abnormal labs with significantly elevated creatinine.  And leukocytosis ---apparently she previously had an episode of coffee-ground emesis and diarrhea as well.  On admission to hospital creatinine was 7.2 and white count was 25,000-blood cultures were negative as well as chest x-ray CT of the head did not show any acute abnormalities this was the case with a CT of the abdomen and pelvis and chest as well. Work-up did  reveal severe sepsis thought secondary from Salmonella enteritis bacteremia and gastroenteritis-she was treated with Rocephin with improvement.  Her acute kidney injury with multiple electrolyte abnormalities improved with IV fluids.  She has been discharged on ampicillin 1000 mg in the a.m. and 500 mg in the p.m. for 2 days which she has just completed.  Currently she is resting in bed comfortably she appears to be more at her baseline although a bit less verbal than I abuse to seeing her-vital signs are stable-she appears to be in no distress and feeling better certainly than when I saw her before she went to the hospital.  Her other diagnoses include significant dementia as well as hypertension coronary artery disease and type 2 diabetes-there was recommendation to possibly decrease her Lantus because she was prone to hypoglycemia in the hospital-her Lantus actually has been discontinued for now her blood sugars are managed with a sliding scale-- blood sugar this morning was in the mid 100s-- per nursing her blood sugars have been stable since her readmission            Past Medical History:  Diagnosis Date   Allergy to ACE inhibitors 03/17/2015   Alzheimer disease ([facility])    Anxiety    CAD (coronary artery disease)    a. s/p inferior STEMI 12/29/10 with rotablator atherectomy RCA 12/31/10 and DES x 2 RCA;  b. Lexiscan Myoview (02/2015): mild reversible apical anterior perfusion defect. C. cath 02/2015 50% LAD lesion with patent stent, Tx Rx     Cardiomyopathy, ischemic 03/17/2015   Chronic diastolic CHF (congestive heart failure) ([facility]) 11/12/2016  COPD (chronic obstructive pulmonary disease) (HCC)    Coronary atherosclerosis of native coronary artery 04/03/2013   Dementia (HCC)    Dementia ([place])    Depression 03/14/2017   Diverticulosis    DM2 (diabetes mellitus, type 2) ([facility]) 04/02/2013   Dysphagia 05/10/2017   Minimal 6/20 - pureed diet   Fibromyalgia 04/20/2008    Qualifier: Diagnosis of  By: Harlon Ditty CMA (AAMA), Dottie     GERD (gastroesophageal reflux disease)    Glaucoma    HTN (hypertension)    Hyperlipidemia    Ischemic cardiomyopathy    EF 45%cath 2012 EF55% 5/12; 35% 11/14   Memory deficit    Nephrolithiasis    Obesity (BMI 30-39.9) 10/06/2015   Osteoarthritis    Overweight(278.02)    Respiratory arrest//ACE Inhibitor presumed cause    Senile dementia without behavioral disturbance ([facility]) 10/06/2015   Sleep apnea    Small bowel obstruction ([place])    from a sigmoid stricture   Ventricular tachycardia ([place]) 03/17/2015   Vitamin D deficiency 01/28/2017   Past Surgical History:  Procedure Laterality Date   bilateral tubal ligation     CARDIAC CATHETERIZATION N/A 03/18/2015   Procedure: LEFT HEART CATH AND CORONARY ANGIOGRAPHY;  Surgeon: Lorretta Harp, MD;  Location: [facility] CATH LAB;  Service: Cardiovascular;  Laterality: N/A;   CARDIAC SURGERY     HIP ARTHROPLASTY Right 11/10/2016   Procedure: ARTHROPLASTY BIPOLAR HIP (HEMIARTHROPLASTY);  Surgeon: Marchia Bond, MD;  Location: [facility];  Service: Orthopedics;  Laterality: Right;   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  07/31/2008   LITHOTRIPSY      Allergies  Allergen Reactions   Lisinopril Other (See Comments)    Reaction:  Respiratory arrest    Spironolactone Anaphylaxis   Vancomycin Anaphylaxis and Rash   Donepezil Diarrhea   Eggs Or Egg-Derived Products Diarrhea   Erythromycin Other (See Comments)    Reaction:  Makes pt feel weird    Macrolides And Ketolides Other (See Comments)    Reaction:  Unknown    Penicillins Itching and Other (See Comments)    Has patient had a PCN reaction causing immediate rash, facial/tongue/throat swelling, SOB or lightheadedness with hypotension: No Has patient had a PCN reaction causing severe rash involving mucus membranes or skin necrosis: No Has patient had a PCN reaction that required  hospitalization No Has patient had a PCN reaction occurring within the last 10 years: No If all of the above answers are "NO", then may proceed with Cephalosporin use.   Quinapril Hcl Itching   Angiotensin Receptor Blockers Other (See Comments)    Reaction:  Unknown    Lipitor [Atorvastatin] Palpitations   Pentazocine Lactate Other (See Comments)    Reaction:  Unknown    Propoxyphene Hcl Other (See Comments)    Reaction unknown   Sulfonamide Derivatives Other (See Comments)    Reaction:  Unknown     Outpatient Encounter Medications as of 10/23/2019  Medication Sig   amLODipine (NORVASC) 5 MG tablet Take 1 tablet (5 mg total) by mouth daily.   aspirin EC 81 MG tablet Take 81 mg by mouth daily.   atorvastatin (LIPITOR) 40 MG tablet Take 40 mg by mouth daily.    clopidogrel (PLAVIX) 75 MG tablet Take 75 mg by mouth daily.    furosemide (LASIX) 20 MG tablet Take 20 mg by mouth daily.    isosorbide mononitrate (IMDUR) 30 MG 24 hr tablet Take 0.5 tablets (15 mg total) by mouth daily.   Magnesium  Oxide (MAG-OXIDE) 200 MG TABS Take 1 tablet (200 mg total) by mouth daily.   memantine (NAMENDA) 10 MG tablet Take 10 mg by mouth 2 (two) times daily.    metoprolol succinate (TOPROL XL) 25 MG 24 hr tablet Take 0.5 tablets (12.5 mg total) by mouth daily.   mometasone-formoterol (DULERA) 100-5 MCG/ACT AERO Inhale 2 puffs into the lungs 2 (two) times daily.    NUTRITIONAL SUPPLEMENTS PO Take 1 each by mouth See admin instructions. Magic Cup with lunch and dinner meal   pantoprazole (PROTONIX) 40 MG tablet Take 1 tablet (40 mg total) by mouth 2 (two) times daily.   sennosides-docusate sodium (SENOKOT-S) 8.6-50 MG tablet Take 2 tablets by mouth daily.   traMADol (ULTRAM) 50 MG tablet Take 50 mg by mouth daily.   [DISCONTINUED] ampicillin (PRINCIPEN) 500 MG capsule 1000 mg in AM 500 mg in PM   [DISCONTINUED] Cholecalciferol (D3-1000) 25 MCG (1000 UT) tablet Take 6,000 Units by mouth  daily.   [DISCONTINUED] insulin glargine (LANTUS) 100 UNIT/ML injection Inject 7 Units into the skin at bedtime.    [DISCONTINUED] insulin lispro (HUMALOG KWIKPEN) 100 UNIT/ML KiwkPen Inject 4 Units into the skin 3 (three) times daily as needed (CBG >300).    [DISCONTINUED] traMADol (ULTRAM) 50 MG tablet Take 1 tablet (50 mg total) by mouth 2 (two) times daily. Back pain   No facility-administered encounter medications on file as of 10/23/2019.     Review of Systems   This is unobtainable secondary to dementia please see HPI-nursing staff does not report any concerns  Immunization History  Administered Date(s) Administered   Influenza Split 08/20/2014   Influenza, High Dose Seasonal PF 10/06/2013   Influenza-Unspecified 10/17/2017, 08/29/2018, 08/27/2019   PPD Test 11/16/2016, 12/05/2016   Pneumococcal Conjugate-13 10/17/2017   Pneumococcal Polysaccharide-23 09/24/2009   Tdap 10/18/2017   Pertinent  Health Maintenance Due  Topic Date Due   FOOT EXAM  11/21/2019 (Originally 11/03/1951)   OPHTHALMOLOGY EXAM  09/08/2020 (Originally 11/03/1951)   HEMOGLOBIN A1C  04/12/2020   URINE MICROALBUMIN  04/30/2020   INFLUENZA VACCINE  Completed   DEXA SCAN  Completed   PNA vac Low Risk Adult  Completed   Fall Risk  04/19/2018  Falls in the past year? No   Functional Status Survey:    Vitals:   10/23/19 1505  BP: 116/72  Pulse: 77  Resp: 18  Temp: 97.9 F (36.6 C)  TempSrc: Oral  SpO2: 97%  Weight: 126 lb 12.8 oz (57.5 kg)  Height: 5\' 4"  (1.626 m)   Body mass index is 21.77 kg/m. Physical Exam   In general this is a pleasant elderly female in no distress lying comfortably in bed she is not talking a whole lot today but is alert does not really follow verbal commands.  Her skin is warm and dry.  Eyes visual acuity appears to be intact sclera and conjunctive are clear.  Oropharynx clear mucous membranes appear fairly moist limited exam since she does not  open her mouth very wide.  Chest is clear to auscultation with somewhat poor respiratory effort there is no labored breathing.  Heart is regular rate and rhythm without murmur gallop or rub she does not have significant edema.  Abdomen is soft nontender with positive bowel sounds.  Musculoskeletal Limited exam since she is in bed but peers to move her upper extremities at baseline holds her lower extremities in a somewhat semicontracted position.  Neurologic she is alert could not really appreciate lateralizing findings  cranial nerves are intact she is not speaking much.  Psych findings consistent with significant dementia and at baseline she is pleasant does not really follow verbal commands     Labs reviewed: Recent Labs    10/14/19 0528  10/19/19 0500 10/20/19 0500 10/21/19 0521  NA 146*   < > 142 141 143  K 4.4   < > 3.5 4.1 3.7  CL 106   < > 106 104 110  CO2 15*   < > 28 24 19*  GLUCOSE 128*   < > 148* 126* 166*  BUN 104*   < > 9 10 6*  CREATININE 6.57*   < > 0.78 0.74 0.75  CALCIUM 8.5*   < > 7.5* 7.8* 8.4*  MG 1.7   < > 2.0 1.6* 1.4*  PHOS 7.5*  --   --   --   --    < > = values in this interval not displayed.   Recent Labs    04/03/19 10/13/19 2117 10/14/19 0528  AST 13 29  --   ALT 5* 16  --   ALKPHOS 65 105  --   BILITOT  --  0.6  --   PROT  --  9.1*  --   ALBUMIN  --  3.5 2.6*   Recent Labs    07/01/19 10/13/19 2010  10/19/19 0500 10/20/19 0500 10/21/19 0521  WBC 9.0 25.5*   < > 11.4* 8.7 6.6  NEUTROABS 5 23.2*  --   --   --   --   HGB 13.2 15.3*   < > 10.3* 10.0* 10.4*  HCT 40 48.1*   < > 31.9* 31.1* 32.9*  MCV  --  101.5*   < > 98.5 99.0 99.7  PLT 156 230   < > 154 174 202   < > = values in this interval not displayed.   Lab Results  Component Value Date   TSH 4.91 10/21/2018   Lab Results  Component Value Date   HGBA1C 6.7 (H) 10/14/2019   Lab Results  Component Value Date   CHOL 127 10/14/2018   HDL 37 10/14/2018   LDLCALC 69  10/14/2018   TRIG 106 10/14/2018   CHOLHDL 2 12/19/2012    Significant Diagnostic Results in last 30 days:  Ct Abdomen Pelvis Wo Contrast  Result Date: 10/13/2019 CLINICAL DATA:  Cough with lethargy, recent cough ground emesis EXAM: CT CHEST, ABDOMEN AND PELVIS WITHOUT CONTRAST TECHNIQUE: Multidetector CT imaging of the chest, abdomen and pelvis was performed following the standard protocol without IV contrast. COMPARISON:  12/31/2014, 05/06/2015 FINDINGS: CT CHEST FINDINGS Cardiovascular: Somewhat limited due to lack of IV contrast. Diffuse aortic calcifications are noted. No aneurysmal dilatation is seen. No cardiac enlargement is noted. Heavy coronary calcifications are noted. Mediastinum/Nodes: The thoracic inlet is within normal limits. No hilar or mediastinal adenopathy is noted. The esophagus as visualized is within normal limits. Lungs/Pleura: Scattered tiny parenchymal nodules are noted bilaterally. These are best visualized on images 53, 92, 52 and 32 of series 5. Some of these were seen on the prior exam. They all measure less than 5 mm. No focal infiltrate or sizable effusion is seen. Mild scarring in the lingula is again seen. Musculoskeletal: Degenerative changes of the thoracic spine are seen. CT ABDOMEN PELVIS FINDINGS Hepatobiliary: No focal liver abnormality is seen. No gallstones, gallbladder wall thickening, or biliary dilatation. Pancreas: Unremarkable. No pancreatic ductal dilatation or surrounding inflammatory changes. Spleen: Normal in size without focal abnormality.  Adrenals/Urinary Tract: Adrenal glands are within normal limits. The kidneys are well visualized bilaterally. No renal calculi or obstructive changes are seen. Previously seen left renal calculus is not well appreciated on today's exam. The bladder is decompressed. Stomach/Bowel: Changes consistent with prior sigmoid surgery are noted with patent anastomosis. The colon is otherwise unremarkable. The appendix is within  normal limits. Vascular/Lymphatic: Aortic atherosclerosis. No enlarged abdominal or pelvic lymph nodes. Reproductive: Uterus and bilateral adnexa are unremarkable. Other: No abdominal wall hernia or abnormality. No abdominopelvic ascites. Musculoskeletal: Right hip replacement is noted. Degenerative changes of the lumbar spine are seen. L1 and L5 compression deformities are seen which appear chronic in nature. The L1 fracture is new from the prior exam. IMPRESSION: CT of the chest: Scattered small parenchymal nodules some of which are stable from the previous exam from 4 years previous. They all measure less than 5 mm. No follow-up needed if patient is low-risk (and has no known or suspected primary neoplasm). Non-contrast chest CT can be considered in 12 months if patient is high-risk. This recommendation follows the consensus statement: Guidelines for Management of Incidental Pulmonary Nodules Detected on CT Images: From the Fleischner Society 2017; Radiology 2017; 284:228-243. CT of the abdomen and pelvis: Previously seen left renal stone is not well appreciated on today's exam. Chronic changes as described without acute abnormality. Aortic Atherosclerosis (ICD10-I70.0). Electronically Signed   By: Inez Catalina M.D.   On: 10/13/2019 20:39   Ct Head Wo Contrast  Result Date: 10/13/2019 CLINICAL DATA:  Encephalopathy EXAM: CT HEAD WITHOUT CONTRAST TECHNIQUE: Contiguous axial images were obtained from the base of the skull through the vertex without intravenous contrast. COMPARISON:  None. FINDINGS: Brain: There is no mass, hemorrhage or extra-axial collection. There is generalized atrophy without lobar predilection. Hypodensity of the white matter is most commonly associated with chronic microvascular disease. Vascular: No abnormal hyperdensity of the major intracranial arteries or dural venous sinuses. No intracranial atherosclerosis. Skull: The visualized skull base, calvarium and extracranial soft tissues  are normal. Sinuses/Orbits: No fluid levels or advanced mucosal thickening of the visualized paranasal sinuses. No mastoid or middle ear effusion. The orbits are normal. IMPRESSION: Generalized atrophy and chronic microvascular ischemia without acute intracranial abnormality. Electronically Signed   By: Ulyses Jarred M.D.   On: 10/13/2019 20:34   Ct Chest Wo Contrast  Result Date: 10/13/2019 CLINICAL DATA:  Cough with lethargy, recent cough ground emesis EXAM: CT CHEST, ABDOMEN AND PELVIS WITHOUT CONTRAST TECHNIQUE: Multidetector CT imaging of the chest, abdomen and pelvis was performed following the standard protocol without IV contrast. COMPARISON:  12/31/2014, 05/06/2015 FINDINGS: CT CHEST FINDINGS Cardiovascular: Somewhat limited due to lack of IV contrast. Diffuse aortic calcifications are noted. No aneurysmal dilatation is seen. No cardiac enlargement is noted. Heavy coronary calcifications are noted. Mediastinum/Nodes: The thoracic inlet is within normal limits. No hilar or mediastinal adenopathy is noted. The esophagus as visualized is within normal limits. Lungs/Pleura: Scattered tiny parenchymal nodules are noted bilaterally. These are best visualized on images 53, 92, 52 and 32 of series 5. Some of these were seen on the prior exam. They all measure less than 5 mm. No focal infiltrate or sizable effusion is seen. Mild scarring in the lingula is again seen. Musculoskeletal: Degenerative changes of the thoracic spine are seen. CT ABDOMEN PELVIS FINDINGS Hepatobiliary: No focal liver abnormality is seen. No gallstones, gallbladder wall thickening, or biliary dilatation. Pancreas: Unremarkable. No pancreatic ductal dilatation or surrounding inflammatory changes. Spleen: Normal in size without focal  abnormality. Adrenals/Urinary Tract: Adrenal glands are within normal limits. The kidneys are well visualized bilaterally. No renal calculi or obstructive changes are seen. Previously seen left renal calculus  is not well appreciated on today's exam. The bladder is decompressed. Stomach/Bowel: Changes consistent with prior sigmoid surgery are noted with patent anastomosis. The colon is otherwise unremarkable. The appendix is within normal limits. Vascular/Lymphatic: Aortic atherosclerosis. No enlarged abdominal or pelvic lymph nodes. Reproductive: Uterus and bilateral adnexa are unremarkable. Other: No abdominal wall hernia or abnormality. No abdominopelvic ascites. Musculoskeletal: Right hip replacement is noted. Degenerative changes of the lumbar spine are seen. L1 and L5 compression deformities are seen which appear chronic in nature. The L1 fracture is new from the prior exam. IMPRESSION: CT of the chest: Scattered small parenchymal nodules some of which are stable from the previous exam from 4 years previous. They all measure less than 5 mm. No follow-up needed if patient is low-risk (and has no known or suspected primary neoplasm). Non-contrast chest CT can be considered in 12 months if patient is high-risk. This recommendation follows the consensus statement: Guidelines for Management of Incidental Pulmonary Nodules Detected on CT Images: From the Fleischner Society 2017; Radiology 2017; 284:228-243. CT of the abdomen and pelvis: Previously seen left renal stone is not well appreciated on today's exam. Chronic changes as described without acute abnormality. Aortic Atherosclerosis (ICD10-I70.0). Electronically Signed   By: Inez Catalina M.D.   On: 10/13/2019 20:39   US Renal  Result Date: 10/14/2019 CLINICAL DATA:  AKI, possible COVID-19, shortness of breath EXAM: RENAL / URINARY TRACT ULTRASOUND COMPLETE COMPARISON:  CT abdomen pelvis 10/13/2019 FINDINGS: Right Kidney: Renal measurements: 8.8 x 4.4 x 5.1 cm = volume: 105 mL. Echogenic, shadowing calculus seen in the mid to lower pole right kidney measuring 1 cm in maximal diameter. Left Kidney: Patient terminated the exam early. Bladder: Patient terminated the  exam early. Other: None. IMPRESSION: 1 cm calculus in the interpolar to lower pole right kidney. No hydronephrosis. The left kidney and bladder are not visualized due to early termination of the exam by the patient. Electronically Signed   By: Lovena Le M.D.   On: 10/14/2019 01:07   Dg Chest Port 1 View  Result Date: 10/13/2019 CLINICAL DATA:  Chest pain and shortness of breath EXAM: PORTABLE CHEST 1 VIEW COMPARISON:  11/10/2016 FINDINGS: Cardiac shadow is stable. Aortic calcifications are again seen. The lungs are well aerated bilaterally with the exception of stable left basilar scarring and effusion no new focal infiltrate is seen. No acute bony abnormality is noted. IMPRESSION: Chronic scarring in the left base.  No acute abnormality seen. Electronically Signed   By: Inez Catalina M.D.   On: 10/13/2019 19:58    Assessment/Plan  #1 history of cysts acute renal failure with severe sepsis thought secondary to Salmonella enteritis bacteremia and gastroenteritis-she has responded to antibiotics has completed ampicillin here in the facility previously treated with Rocephin-she did receive IV fluids as well and appears to be doing much better at this point continue to monitor continue supportive care.  2.  History of acute metabolic encephalopathy again this appears largely resolved status post treatment for sepsis-she does have baseline dementia which appears to be at her normal level at this point.  3.  History of a coffee-ground emesis she is on a PPI twice daily hemoglobin shows stability in the hospital update hemoglobin is pending for early next week.  4.  History of hyperkalemia this resolved in the hospital and  update lab is pending next week.  5.  History of low magnesium this was supplemented in the hospital and magnesium level has been ordered for next week as well.  6.  History of dementia this is baseline it appears she is on Namenda 10 mg twice daily her Remeron and trazodone were  discontinued in the hospital at this point will monitor.  7.  History of type 2 diabetes she is no longer on Lantus this has been discontinued nonetheless her blood sugars appear to be stable largely averaging in the 100s per nursing-she does continue on Humalog if CBG is greater than 300 but does not appear she has really needed this since she has come back.  8.  History of hypertension at this point appears to be stable she is on Toprol-XL 12.5 mg a day.  As well as Norvasc 5 mg a day and Lasix 20 mg a day.  9.  History of coronary artery disease again she continues on Toprol 12.5 mg a day she is on Imdur 50 mg a day Plavix 75 mg a day as well as aspirin 81 mg a day and Lipitor 40 mg a day at this point she appears to be at her baseline.  10.  History of diastolic CHF she continues on Lasix 20 mg a day as well as Toprol-XL 12.5 mg a day she does not show evidence of edema or fluid overload at this point will monitor.  11.  History of COPD she does have orders for [facility] at this point appears to be doing well.  12.  Pain management-she is on tramadol 50 mg twice daily and this has been well-tolerated in the past at this point will monitor.  Again update labs including CBC and metabolic panel as well as magnesium level have been ordered for Monday, December 7-clinically she appears to be back on her baseline.  [id number] note greater than 40 minutes spent assessing patient-discussing her status with nursing staff-coordinating and formulating a plan of care for numerous diagnoses-of note greater than 50% of time spent coordinating a plan of care with input as noted above

## 2019-10-24 ENCOUNTER — Encounter: Payer: Self-pay | Admitting: Internal Medicine

## 2019-10-26 ENCOUNTER — Encounter: Payer: Self-pay | Admitting: Internal Medicine

## 2019-10-26 DIAGNOSIS — A020 Salmonella enteritis: Secondary | ICD-10-CM | POA: Insufficient documentation

## 2019-10-26 DIAGNOSIS — A021 Salmonella sepsis: Secondary | ICD-10-CM | POA: Insufficient documentation

## 2019-10-26 NOTE — Progress Notes (Signed)
: Provider:  Hennie Duos MD Location:   [facility]   Place of Service:   SNF  PCP: Hennie Duos, MD Patient Care Team: Hennie Duos, MD as PCP - General (Internal Medicine)  Extended Emergency Contact Information Primary Emergency Contact: [name],[name] Address: [address]          [place], [place] [address] [name] of [place] Phone: [phone] Work Phone: [phone] Mobile Phone: [phone] Relation: Daughter Secondary Emergency Contact: [name],[name]  [place] Phone: [phone] Relation: Niece     Allergies: Lisinopril, Spironolactone, Vancomycin, Donepezil, Eggs or egg-derived products, Erythromycin, Macrolides and ketolides, Penicillins, Quinapril hcl, Angiotensin receptor blockers, Lipitor [atorvastatin], Pentazocine lactate, Propoxyphene hcl, and Sulfonamide derivatives  Chief Complaint  Patient presents with   Readmit To SNF    HPI: Patient is 78 y.o. female with Alzheimer's dementia, CAD status post PCI, chronic diastolic congestive heart failure, COPD, diabetes mellitus type 2, hypertension, hyperlipidemia who was brought to the emergency department for lethargy and altered mental status.  Nursing reported patient had vomiting and diarrhea starting on Friday.  Patient was hypoglycemic in the emergency department also patient had one episode of coffee-ground emesis 3 days ago.  Work-up in the ED found patient to have a WBC of 25,000 lactic acid 6.7 BUN 106 and creatinine 7.2, bicarb 10 potassium 5.1 anion gap 27.  Chest x-ray did not show infiltrates CT head no acute abnormalities CT abdomen pelvis CT chest no abnormalities patient was admitted to [facility] from 11/23-12/1 where she was ultimately found to be septic from Salmonella enteritis and gastroenteritis.  She was treated with Rocephin with significant improvement of her symptoms her acute kidney injury with multiple electrolyte derangements improved with  IV fluids.  Patient is admitted back to skilled nursing facility for OT/PT and residential care.  While at skilled nursing facility patient will be followed for dementia treated with Namenda, hyperlipidemia treated with Lipitor and diabetes mellitus treated with diet alone.  Past Medical History:  Diagnosis Date   Allergy to ACE inhibitors 03/17/2015   Alzheimer disease ([place])    Anxiety    CAD (coronary artery disease)    a. s/p inferior STEMI 12/29/10 with rotablator atherectomy RCA 12/31/10 and DES x 2 RCA;  b. Lexiscan Myoview (02/2015): mild reversible apical anterior perfusion defect. C. cath 02/2015 50% LAD lesion with patent stent, Tx Rx     Cardiomyopathy, ischemic 03/17/2015   Chronic diastolic CHF (congestive heart failure) ([place]) 11/12/2016   COPD (chronic obstructive pulmonary disease) ([place])    Coronary atherosclerosis of native coronary artery 04/03/2013   Dementia ([place])    Dementia ([place])    Depression 03/14/2017   Diverticulosis    DM2 (diabetes mellitus, type 2) ([place]) 04/02/2013   Dysphagia 05/10/2017   Minimal 6/20 - pureed diet   Fibromyalgia 04/20/2008   Qualifier: Diagnosis of  By: Harlon Ditty CMA (AAMA), Dott[place]     GERD (gastroesophageal reflux disease)    Glaucoma    HTN (hypertension)    Hyperlipidemia    Ischemic cardiomyopathy    EF 45%cath 2012 EF55% 5/12; 35% 11/14   Memory deficit    Nephrolithiasis    Obesity (BMI 30-39.9) 10/06/2015   Osteoarthritis    Overweight(278.02)    Respiratory arrest//ACE Inhibitor presumed cause    Senile dementia without behavioral disturbance ([place]) 10/06/2015   Sleep apnea    Small bowel obstruction ([place])    from a sigmoid stricture   Ventricular tachycardia ([place]) 03/17/2015  Vitamin D deficiency 01/28/2017    Past Surgical History:  Procedure Laterality Date   bilateral tubal ligation     CARDIAC CATHETERIZATION N/A 03/18/2015   Procedure: LEFT HEART CATH AND CORONARY ANGIOGRAPHY;  Surgeon:  Lorretta Harp, MD;  Location: [facility] CATH LAB;  Service: Cardiovascular;  Laterality: N/A;   CARDIAC SURGERY     HIP ARTHROPLASTY Right 11/10/2016   Procedure: ARTHROPLASTY BIPOLAR HIP (HEMIARTHROPLASTY);  Surgeon: Marchia Bond, MD;  Location: [place];  Service: Orthopedics;  Laterality: Right;   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  07/31/2008   LITHOTRIPSY      Allergies as of 10/21/2019      Reactions   Lisinopril Other (See Comments)   Reaction:  Respiratory arrest    Spironolactone Anaphylaxis   Vancomycin Anaphylaxis, Rash   Donepezil Diarrhea   Eggs Or Egg-derived Products Diarrhea   Erythromycin Other (See Comments)   Reaction:  Makes pt feel weird    Macrolides And Ketolides Other (See Comments)   Reaction:  Unknown    Penicillins Itching, Other (See Comments)   Has patient had a PCN reaction causing immediate rash, facial/tongue/throat swelling, SOB or lightheadedness with hypotension: No Has patient had a PCN reaction causing severe rash involving mucus membranes or skin necrosis: No Has patient had a PCN reaction that required hospitalization No Has patient had a PCN reaction occurring within the last 10 years: No If all of the above answers are "NO", then may proceed with Cephalosporin use.   Quinapril Hcl Itching   Angiotensin Receptor Blockers Other (See Comments)   Reaction:  Unknown    Lipitor [atorvastatin] Palpitations   Pentazocine Lactate Other (See Comments)   Reaction:  Unknown    Propoxyphene Hcl Other (See Comments)   Reaction unknown   Sulfonamide Derivatives Other (See Comments)   Reaction:  Unknown       Medication List    Notice   This visit is on the same day as an admission, and a visit start time could not be determined. If the visit took place after discharge, manually review the med list with the patient.    Current Outpatient Medications on File Prior to Visit  Medication Sig Dispense Refill   amLODipine (NORVASC) 5  MG tablet Take 1 tablet (5 mg total) by mouth daily. 30 tablet 0   aspirin EC 81 MG tablet Take 81 mg by mouth daily.     atorvastatin (LIPITOR) 40 MG tablet Take 40 mg by mouth daily.      clopidogrel (PLAVIX) 75 MG tablet Take 75 mg by mouth daily.      furosemide (LASIX) 20 MG tablet Take 20 mg by mouth daily.      isosorbide mononitrate (IMDUR) 30 MG 24 hr tablet Take 0.5 tablets (15 mg total) by mouth daily. 15 tablet 5   Magnesium Oxide (MAG-OXIDE) 200 MG TABS Take 1 tablet (200 mg total) by mouth daily. 30 tablet 0   memantine (NAMENDA) 10 MG tablet Take 10 mg by mouth 2 (two) times daily.      metoprolol succinate (TOPROL XL) 25 MG 24 hr tablet Take 0.5 tablets (12.5 mg total) by mouth daily. 30 tablet 0   mometasone-formoterol (DULERA) 100-5 MCG/ACT AERO Inhale 2 puffs into the lungs 2 (two) times daily.      NUTRITIONAL SUPPLEMENTS PO Take 1 each by mouth See admin instructions. Magic Cup with lunch and dinner meal     pantoprazole (PROTONIX) 40 MG tablet Take  1 tablet (40 mg total) by mouth 2 (two) times daily. 120 tablet 0   sennosides-docusate sodium (SENOKOT-S) 8.6-50 MG tablet Take 2 tablets by mouth daily. 30 tablet 1   No current facility-administered medications on file prior to visit.      No orders of the defined types were placed in this encounter.   Immunization History  Administered Date(s) Administered   Influenza Split 08/20/2014   Influenza, High Dose Seasonal PF 10/06/2013   Influenza-Unspecified 10/17/2017, 08/29/2018, 08/27/2019   PPD Test 11/16/2016, 12/05/2016   Pneumococcal Conjugate-13 10/17/2017   Pneumococcal Polysaccharide-23 09/24/2009   Tdap 10/18/2017    Social History   Tobacco Use   Smoking status: Former Smoker    Packs/day: 1.00    Years: 25.00    Pack years: 25.00    Types: Cigarettes   Smokeless tobacco: Never Used   Tobacco comment: over a ppd for 40+ years; quit 10/2004.  unsuccessfully tried eCigs.    Substance Use Topics   Alcohol use: No    Alcohol/week: 1.0 standard drinks    Types: 1 Standard drinks or equivalent per week    Family history is   Family History  Problem Relation Age of Onset   Diabetes Father    Heart disease Father    Heart attack Father    Stroke Mother    Breast cancer Maternal Grandmother    Diabetes Sister    Hypertension Sister    Colon cancer Neg Hx       Review of Systems unable to obtain secondary to patient condition; nursing-no acute concerns     Vitals:   10/26/19 1249  BP: (!) 150/76  Pulse: 84  Resp: 18  Temp: (!) 97 F (36.1 C)  SpO2: 97%    SpO2 Readings from Last 1 Encounters:  10/23/19 97%   There is no height or weight on file to calculate BMI.     Physical Exam  GENERAL APPEARANCE: Alert,  No acute distress.  SKIN: No diaphoresis rash HEAD: Normocephalic, atraumatic  EYES: Conjunctiva/lids clear. Pupils round, reactive. EOMs intact.  EARS: External exam WNL, canals clear. Hearing grossly normal.  NOSE: No deformity or discharge.  MOUTH/THROAT: Lips w/o lesions  RESPIRATORY: Breathing is even, unlabored. Lung sounds are clear   CARDIOVASCULAR: Heart RRR no murmurs, rubs or gallops. No peripheral edema.   GASTROINTESTINAL: Abdomen is soft, non-tender, not distended w/ normal bowel sounds. GENITOURINARY: Bladder non tender, not distended  MUSCULOSKELETAL: No abnormal joints or musculature NEUROLOGIC:  Cranial nerves 2-12 grossly intact. Moves all extremities  PSYCHIATRIC: Mood and affect with dementia, no behavioral issues  Patient Active Problem List   Diagnosis Date Noted   Acute renal failure (ARF) ([place]) Q000111Q   Metabolic acidosis Q000111Q   Acute metabolic encephalopathy Q000111Q   Viral gastroenteritis 10/14/2019   AKI (acute kidney injury) ([place]) 10/14/2019   Hypertension associated with type 2 diabetes mellitus ([place]) 03/06/2019   Dyslipidemia associated with type 2 diabetes  mellitus ([place]) 03/06/2019   Acquired hypothyroidism 03/06/2019   Vascular dementia without behavioral disturbance ([place]) 03/06/2019   Insomnia 08/06/2018   GERD (gastroesophageal reflux disease) 08/04/2017   Dysphagia 05/10/2017   Depression 03/14/2017   Vitamin D deficiency 01/28/2017   UTI due to extended-spectrum beta lactamase (ESBL) producing Escherichia coli 11/18/2016   Urinary tract infection due to Proteus 11/18/2016   Postoperative anemia due to acute blood loss 11/18/2016   HTN (hypertension) 11/18/2016   Closed right hip fracture ([place]) 11/10/2016   Lactic acidosis  01/02/2016   Senile dementia without behavioral disturbance ([facility]) 10/06/2015   Obesity (BMI 30-39.9) 10/06/2015   Tobacco use disorder 03/17/2015   Cardiomyopathy, ischemic 03/17/2015   Allergy to ACE inhibitors 03/17/2015   Ventricular tachycardia ([place]) 03/17/2015   HLD (hyperlipidemia) 12/31/2014   COPD (chronic obstructive pulmonary disease) ([place]) 12/31/2014   Chronic cough 09/12/2013   Memory deficit 04/23/2013   Essential hypertension, benign 04/03/2013   Dyslipidemia 04/03/2013   Coronary atherosclerosis of native coronary artery 04/03/2013   DM2 (diabetes mellitus, type 2) ([place]) 04/02/2013   Fibromyalgia 04/20/2008      Labs reviewed: Basic Metabolic Panel:    Component Value Date/Time   NA 143 10/21/2019 0521   NA 144 07/28/2019   K 3.7 10/21/2019 0521   CL 110 10/21/2019 0521   CO2 19 (L) 10/21/2019 0521   GLUCOSE 166 (H) 10/21/2019 0521   BUN 6 (L) 10/21/2019 0521   BUN 27 (A) 07/28/2019   CREATININE 0.75 10/21/2019 0521   CALCIUM 8.4 (L) 10/21/2019 0521   PROT 9.1 (H) 10/13/2019 2117   ALBUMIN 2.6 (L) 10/14/2019 0528   AST 29 10/13/2019 2117   ALT 16 10/13/2019 2117   ALKPHOS 105 10/13/2019 2117   BILITOT 0.6 10/13/2019 2117   GFRNONAA >60 10/21/2019 0521   GFRAA >60 10/21/2019 0521    Recent Labs    10/14/19 0528  10/19/19 0500 10/20/19 0500  10/21/19 0521  NA 146*   < > 142 141 143  K 4.4   < > 3.5 4.1 3.7  CL 106   < > 106 104 110  CO2 15*   < > 28 24 19*  GLUCOSE 128*   < > 148* 126* 166*  BUN 104*   < > 9 10 6*  CREATININE 6.57*   < > 0.78 0.74 0.75  CALCIUM 8.5*   < > 7.5* 7.8* 8.4*  MG 1.7   < > 2.0 1.6* 1.4*  PHOS 7.5*  --   --   --   --    < > = values in this interval not displayed.   Liver Function Tests: Recent Labs    04/03/19 10/13/19 2117 10/14/19 0528  AST 13 29  --   ALT 5* 16  --   ALKPHOS 65 105  --   BILITOT  --  0.6  --   PROT  --  9.1*  --   ALBUMIN  --  3.5 2.6*   Recent Labs    10/13/19 2117  LIPASE 20   No results for input(s): AMMONIA in the last 8760 hours. CBC: Recent Labs    07/01/19 10/13/19 2010  10/19/19 0500 10/20/19 0500 10/21/19 0521  WBC 9.0 25.5*   < > 11.4* 8.7 6.6  NEUTROABS 5 23.2*  --   --   --   --   HGB 13.2 15.3*   < > 10.3* 10.0* 10.4*  HCT 40 48.1*   < > 31.9* 31.1* 32.9*  MCV  --  101.5*   < > 98.5 99.0 99.7  PLT 156 230   < > 154 174 202   < > = values in this interval not displayed.   Lipid No results for input(s): CHOL, HDL, LDLCALC, TRIG in the last 8760 hours.  Cardiac Enzymes: No results for input(s): CKTOTAL, CKMB, CKMBINDEX, TROPONINI in the last 8760 hours. BNP: No results for input(s): BNP in the last 8760 hours. Lab Results  Component Value Date   MICROALBUR 1.2 05/01/2019  Lab Results  Component Value Date   HGBA1C 6.7 (H) 10/14/2019   Lab Results  Component Value Date   TSH 4.91 10/21/2018   No results found for: VITAMINB12 No results found for: FOLATE No results found for: IRON, TIBC, FERRITIN  Imaging and Procedures obtained prior to SNF admission: Ct Abdomen Pelvis Wo Contrast  Result Date: 10/13/2019 CLINICAL DATA:  Cough with lethargy, recent cough ground emesis EXAM: CT CHEST, ABDOMEN AND PELVIS WITHOUT CONTRAST TECHNIQUE: Multidetector CT imaging of the chest, abdomen and pelvis was performed following the standard  protocol without IV contrast. COMPARISON:  12/31/2014, 05/06/2015 FINDINGS: CT CHEST FINDINGS Cardiovascular: Somewhat limited due to lack of IV contrast. Diffuse aortic calcifications are noted. No aneurysmal dilatation is seen. No cardiac enlargement is noted. Heavy coronary calcifications are noted. Mediastinum/Nodes: The thoracic inlet is within normal limits. No hilar or mediastinal adenopathy is noted. The esophagus as visualized is within normal limits. Lungs/Pleura: Scattered tiny parenchymal nodules are noted bilaterally. These are best visualized on images 53, 92, 52 and 32 of series 5. Some of these were seen on the prior exam. They all measure less than 5 mm. No focal infiltrate or sizable effusion is seen. Mild scarring in the lingula is again seen. Musculoskeletal: Degenerative changes of the thoracic spine are seen. CT ABDOMEN PELVIS FINDINGS Hepatobiliary: No focal liver abnormality is seen. No gallstones, gallbladder wall thickening, or biliary dilatation. Pancreas: Unremarkable. No pancreatic ductal dilatation or surrounding inflammatory changes. Spleen: Normal in size without focal abnormality. Adrenals/Urinary Tract: Adrenal glands are within normal limits. The kidneys are well visualized bilaterally. No renal calculi or obstructive changes are seen. Previously seen left renal calculus is not well appreciated on today's exam. The bladder is decompressed. Stomach/Bowel: Changes consistent with prior sigmoid surgery are noted with patent anastomosis. The colon is otherwise unremarkable. The appendix is within normal limits. Vascular/Lymphatic: Aortic atherosclerosis. No enlarged abdominal or pelvic lymph nodes. Reproductive: Uterus and bilateral adnexa are unremarkable. Other: No abdominal wall hernia or abnormality. No abdominopelvic ascites. Musculoskeletal: Right hip replacement is noted. Degenerative changes of the lumbar spine are seen. L1 and L5 compression deformities are seen which appear  chronic in nature. The L1 fracture is new from the prior exam. IMPRESSION: CT of the chest: Scattered small parenchymal nodules some of which are stable from the previous exam from 4 years previous. They all measure less than 5 mm. No follow-up needed if patient is low-risk (and has no known or suspected primary neoplasm). Non-contrast chest CT can be considered in 12 months if patient is high-risk. This recommendation follows the consensus statement: Guidelines for Management of Incidental Pulmonary Nodules Detected on CT Images: From the Fleischner Society 2017; Radiology 2017; 284:228-243. CT of the abdomen and pelvis: Previously seen left renal stone is not well appreciated on today's exam. Chronic changes as described without acute abnormality. Aortic Atherosclerosis (ICD10-I70.0). Electronically Signed   By: Inez Catalina M.D.   On: 10/13/2019 20:39   Ct Head Wo Contrast  Result Date: 10/13/2019 CLINICAL DATA:  Encephalopathy EXAM: CT HEAD WITHOUT CONTRAST TECHNIQUE: Contiguous axial images were obtained from the base of the skull through the vertex without intravenous contrast. COMPARISON:  None. FINDINGS: Brain: There is no mass, hemorrhage or extra-axial collection. There is generalized atrophy without lobar predilection. Hypodensity of the white matter is most commonly associated with chronic microvascular disease. Vascular: No abnormal hyperdensity of the major intracranial arteries or dural venous sinuses. No intracranial atherosclerosis. Skull: The visualized skull base, calvarium and  extracranial soft tissues are normal. Sinuses/Orbits: No fluid levels or advanced mucosal thickening of the visualized paranasal sinuses. No mastoid or middle ear effusion. The orbits are normal. IMPRESSION: Generalized atrophy and chronic microvascular ischemia without acute intracranial abnormality. Electronically Signed   By: Ulyses Jarred M.D.   On: 10/13/2019 20:34   Ct Chest Wo Contrast  Result Date:  10/13/2019 CLINICAL DATA:  Cough with lethargy, recent cough ground emesis EXAM: CT CHEST, ABDOMEN AND PELVIS WITHOUT CONTRAST TECHNIQUE: Multidetector CT imaging of the chest, abdomen and pelvis was performed following the standard protocol without IV contrast. COMPARISON:  12/31/2014, 05/06/2015 FINDINGS: CT CHEST FINDINGS Cardiovascular: Somewhat limited due to lack of IV contrast. Diffuse aortic calcifications are noted. No aneurysmal dilatation is seen. No cardiac enlargement is noted. Heavy coronary calcifications are noted. Mediastinum/Nodes: The thoracic inlet is within normal limits. No hilar or mediastinal adenopathy is noted. The esophagus as visualized is within normal limits. Lungs/Pleura: Scattered tiny parenchymal nodules are noted bilaterally. These are best visualized on images 53, 92, 52 and 32 of series 5. Some of these were seen on the prior exam. They all measure less than 5 mm. No focal infiltrate or sizable effusion is seen. Mild scarring in the lingula is again seen. Musculoskeletal: Degenerative changes of the thoracic spine are seen. CT ABDOMEN PELVIS FINDINGS Hepatobiliary: No focal liver abnormality is seen. No gallstones, gallbladder wall thickening, or biliary dilatation. Pancreas: Unremarkable. No pancreatic ductal dilatation or surrounding inflammatory changes. Spleen: Normal in size without focal abnormality. Adrenals/Urinary Tract: Adrenal glands are within normal limits. The kidneys are well visualized bilaterally. No renal calculi or obstructive changes are seen. Previously seen left renal calculus is not well appreciated on today's exam. The bladder is decompressed. Stomach/Bowel: Changes consistent with prior sigmoid surgery are noted with patent anastomosis. The colon is otherwise unremarkable. The appendix is within normal limits. Vascular/Lymphatic: Aortic atherosclerosis. No enlarged abdominal or pelvic lymph nodes. Reproductive: Uterus and bilateral adnexa are  unremarkable. Other: No abdominal wall hernia or abnormality. No abdominopelvic ascites. Musculoskeletal: Right hip replacement is noted. Degenerative changes of the lumbar spine are seen. L1 and L5 compression deformities are seen which appear chronic in nature. The L1 fracture is new from the prior exam. IMPRESSION: CT of the chest: Scattered small parenchymal nodules some of which are stable from the previous exam from 4 years previous. They all measure less than 5 mm. No follow-up needed if patient is low-risk (and has no known or suspected primary neoplasm). Non-contrast chest CT can be considered in 12 months if patient is high-risk. This recommendation follows the consensus statement: Guidelines for Management of Incidental Pulmonary Nodules Detected on CT Images: From the Fleischner Society 2017; Radiology 2017; 284:228-243. CT of the abdomen and pelvis: Previously seen left renal stone is not well appreciated on today's exam. Chronic changes as described without acute abnormality. Aortic Atherosclerosis (ICD10-I70.0). Electronically Signed   By: Inez Catalina M.D.   On: 10/13/2019 20:39   US Renal  Result Date: 10/14/2019 CLINICAL DATA:  AKI, possible COVID-19, shortness of breath EXAM: RENAL / URINARY TRACT ULTRASOUND COMPLETE COMPARISON:  CT abdomen pelvis 10/13/2019 FINDINGS: Right Kidney: Renal measurements: 8.8 x 4.4 x 5.1 cm = volume: 105 mL. Echogenic, shadowing calculus seen in the mid to lower pole right kidney measuring 1 cm in maximal diameter. Left Kidney: Patient terminated the exam early. Bladder: Patient terminated the exam early. Other: None. IMPRESSION: 1 cm calculus in the interpolar to lower pole right kidney. No hydronephrosis.  The left kidney and bladder are not visualized due to early termination of the exam by the patient. Electronically Signed   By: Lovena Le M.D.   On: 10/14/2019 01:07   Dg Chest Port 1 View  Result Date: 10/13/2019 CLINICAL DATA:  Chest pain and  shortness of breath EXAM: PORTABLE CHEST 1 VIEW COMPARISON:  11/10/2016 FINDINGS: Cardiac shadow is stable. Aortic calcifications are again seen. The lungs are well aerated bilaterally with the exception of stable left basilar scarring and effusion no new focal infiltrate is seen. No acute bony abnormality is noted. IMPRESSION: Chronic scarring in the left base.  No acute abnormality seen. Electronically Signed   By: Inez Catalina M.D.   On: 10/13/2019 19:58     Not all labs, radiology exams or other studies done during hospitalization come through on my EPIC note; however they are reviewed by me.    Assessment and Plan  Acute renal failure-creatinine 7 on admission; resolved with IV fluids; renal ultrasound demonstrating no hydronephrosis or obstructive uropathy SNF-admitted back to SNF for residential care; knee will need to encourage water p.o. intake  Salmonella sepsis secondary to Salmonella enteritis-white count 25,000, lactic acid 7 afebrile no leukocytosis; completed 8 days of oon for 2 more days  Acute metabolic encephalopathy secondary to sepsis and electrolyte imbalance and underlying advanced dementia; resolved  Advanced dementia SNF-continue Namenda 5 mg twice daily  Hyperlipidemia SNF-not stated as uncontrolled; continue Lipitor 40 mg daily  Diabetes mellitus type 2-A1c 6.7 SNF-patient was DC'd on 7 units of Lantus, we are stopping this and will do CBGs every morning; at patient's age and mostly at her advanced dementia status she probably does not need any insulin at all we will monitor results    Hennie Duos, MD

## 2019-10-27 LAB — CBC: RBC: 3.66 — AB (ref 3.87–5.11)

## 2019-10-27 LAB — CBC AND DIFFERENTIAL
HCT: 36 (ref 36–46)
Hemoglobin: 11.7 — AB (ref 12.0–16.0)
Neutrophils Absolute: 5
Platelets: 329 (ref 150–399)
WBC: 8

## 2019-10-27 LAB — BASIC METABOLIC PANEL
BUN: 15 (ref 4–21)
CO2: 29 — AB (ref 13–22)
Chloride: 102 (ref 99–108)
Creatinine: 0.6 (ref 0.5–1.1)
Glucose: 225
Potassium: 4.7 (ref 3.4–5.3)
Sodium: 140 (ref 137–147)

## 2019-10-27 LAB — COMPREHENSIVE METABOLIC PANEL
Calcium: 9.2 (ref 8.7–10.7)
GFR calc Af Amer: 90
GFR calc non Af Amer: 87.12

## 2019-11-04 LAB — VITAMIN D 25 HYDROXY (VIT D DEFICIENCY, FRACTURES): Vit D, 25-Hydroxy: 16.26

## 2019-11-12 LAB — CULTURE, BLOOD (ROUTINE X 2)

## 2019-11-24 ENCOUNTER — Non-Acute Institutional Stay (SKILLED_NURSING_FACILITY): Payer: Medicare Other | Admitting: Internal Medicine

## 2019-11-24 ENCOUNTER — Encounter: Payer: Self-pay | Admitting: Internal Medicine

## 2019-11-24 DIAGNOSIS — U071 COVID-19: Secondary | ICD-10-CM | POA: Diagnosis not present

## 2019-11-24 DIAGNOSIS — J1282 Pneumonia due to coronavirus disease 2019: Secondary | ICD-10-CM

## 2019-11-24 NOTE — Progress Notes (Signed)
Location:  [facility] Room Number: 422-W Place of Service:  SNF (31)  Hennie Duos, MD  Patient Care Team: Hennie Duos, MD as PCP - General (Internal Medicine)  Extended Emergency Contact Information Primary Emergency Contact: [name],[name] Address: [address]          [place], [place] [address] [name] of [place] Phone: [phone] Work Phone: [phone] Mobile Phone: [phone] Relation: Daughter Secondary Emergency Contact: [name],[name]  [place] Phone: [phone] Relation: Niece    Allergies: Lisinopril, Spironolactone, Vancomycin, Donepezil, Eggs or egg-derived products, Erythromycin, Macrolides and ketolides, Penicillins, Quinapril hcl, Angiotensin receptor blockers, Lipitor [atorvastatin], Pentazocine lactate, Propoxyphene hcl, and Sulfonamide derivatives  Chief Complaint  Patient presents with  . Acute Visit    Patient seen for COVID positive swab    HPI: Patient is a 79 y.o. female who is being seen because she was diagnosed with COVID-19 by PCR last week.  Initially she was asymptomatic but in the last day or 2 she is started to have a cough.  Her O2 saturation is 93-94 room air which is usual for her but I do hear Rales on the right and patient does have a prolonged expiratory phase but no wheezing.  There is no reported fever.  Past Medical History:  Diagnosis Date  . Allergy to ACE inhibitors 03/17/2015  . Alzheimer disease ([place])   . Anxiety   . CAD (coronary artery disease)    a. s/p inferior STEMI 12/29/10 with rotablator atherectomy RCA 12/31/10 and DES x 2 RCA;  b. Lexiscan Myoview (02/2015): mild reversible apical anterior perfusion defect. C. cath 02/2015 50% LAD lesion with patent stent, Tx Rx    . Cardiomyopathy, ischemic 03/17/2015  . Chronic diastolic CHF (congestive heart failure) ([place]) 11/12/2016  . COPD (chronic obstructive pulmonary disease) ([place])   . Coronary  atherosclerosis of native coronary artery 04/03/2013  . Dementia ([place])   . Dementia ([place])   . Depression 03/14/2017  . Diverticulosis   . DM2 (diabetes mellitus, type 2) ([place]) 04/02/2013  . Dysphagia 05/10/2017   Minimal 6/20 - pureed diet  . Fibromyalgia 04/20/2008   Qualifier: Diagnosis of  By: Nelson-Smith CMA (AAMA), Dottie    . GERD (gastroesophageal reflux disease)   . Glaucoma   . HTN (hypertension)   . Hyperlipidemia   . Ischemic cardiomyopathy    EF 45%cath 2012 EF55% 5/12; 35% 11/14  . Memory deficit   . Nephrolithiasis   . Obesity (BMI 30-39.9) 10/06/2015  . Osteoarthritis   . Overweight(278.02)   . Respiratory arrest//ACE Inhibitor presumed cause   . 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 Sleep apnea   . Small bowel obstruction ([facility])    from a sigmoid stricture  . Ventricular tachycardia ([place]) 03/17/2015  . Vitamin D deficiency 01/28/2017    Past Surgical History:  Procedure Laterality Date  . bilateral tubal ligation    . CARDIAC CATHETERIZATION N/A 03/18/2015   Procedure: LEFT HEART CATH AND CORONARY ANGIOGRAPHY;  Surgeon: Lorretta Harp, MD;  Location: [facility] CATH LAB;  Service: Cardiovascular;  Laterality: N/A;  . CARDIAC SURGERY    . HIP ARTHROPLASTY Right 11/10/2016   Procedure: ARTHROPLASTY BIPOLAR HIP (HEMIARTHROPLASTY);  Surgeon: Marchia Bond, MD;  Location: [facility];  Service: Orthopedics;  Laterality: Right;  . 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 LITHOTRIPSY      Allergies as of 11/24/2019  Reactions   Lisinopril Other (See Comments)   Reaction:  Respiratory arrest    Spironolactone Anaphylaxis   Vancomycin Anaphylaxis, Rash   Donepezil Diarrhea   Eggs Or Egg-derived Products Diarrhea   Erythromycin Other (See Comments)   Reaction:  Makes pt feel weird    Macrolides And Ketolides Other (See Comments)   Reaction:  Unknown    Penicillins Itching, Other (See Comments)   Has patient had a PCN  reaction causing immediate rash, facial/tongue/throat swelling, SOB or lightheadedness with hypotension: No Has patient had a PCN reaction causing severe rash involving mucus membranes or skin necrosis: No Has patient had a PCN reaction that required hospitalization No Has patient had a PCN reaction occurring within the last 10 years: No If all of the above answers are "NO", then may proceed with Cephalosporin use.   Quinapril Hcl Itching   Angiotensin Receptor Blockers Other (See Comments)   Reaction:  Unknown    Lipitor [atorvastatin] Palpitations   Pentazocine Lactate Other (See Comments)   Reaction:  Unknown    Propoxyphene Hcl Other (See Comments)   Reaction unknown   Sulfonamide Derivatives Other (See Comments)   Reaction:  Unknown       Medication List       Accurate as of November 24, 2019  3:56 PM. If you have any questions, ask your nurse or doctor.        STOP taking these medications   Mag-Oxide 200 MG Tabs Generic drug: Magnesium Oxide Stopped by: Inocencio Homes, MD     TAKE these medications   amLODipine 5 MG tablet Commonly known as: NORVASC Take 1 tablet (5 mg total) by mouth daily.   ascorbic acid 500 MG tablet Commonly known as: VITAMIN C Take 1,000 mg by mouth daily.   aspirin EC 81 MG tablet Take 81 mg by mouth daily.   atorvastatin 40 MG tablet Commonly known as: LIPITOR Take 40 mg by mouth daily.   clopidogrel 75 MG tablet Commonly known as: PLAVIX Take 75 mg by mouth daily.   feeding supplement Liqd Commonly known as: BOOST / RESOURCE BREEZE Take 1 Container by mouth 2 (two) times daily.   furosemide 20 MG tablet Commonly known as: LASIX Take 20 mg by mouth daily.   isosorbide mononitrate 30 MG 24 hr tablet Commonly known as: IMDUR Take 0.5 tablets (15 mg total) by mouth daily.   magnesium oxide 400 MG tablet Commonly known as: MAG-OX Take 400 mg by mouth daily.   memantine 10 MG tablet Commonly known as: NAMENDA Take 10 mg by  mouth 2 (two) times daily.   metoprolol succinate 25 MG 24 hr tablet Commonly known as: Toprol XL Take 0.5 tablets (12.5 mg total) by mouth daily.   mometasone-formoterol 100-5 MCG/ACT Aero Commonly known as: DULERA Inhale 2 puffs into the lungs 2 (two) times daily.   NUTRITIONAL SUPPLEMENTS PO Take 1 each by mouth See admin instructions. Magic Cup with lunch and dinner meal   pantoprazole 40 MG tablet Commonly known as: PROTONIX Take 1 tablet (40 mg total) by mouth 2 (two) times daily.   sennosides-docusate sodium 8.6-50 MG tablet Commonly known as: SENOKOT-S Take 2 tablets by mouth daily.   traMADol 50 MG tablet Commonly known as: ULTRAM Take 50 mg by mouth daily.   Vitamin D3 1.25 MG (50000 UT) Caps Take 1 capsule by mouth once a week.   zinc sulfate 220 (50 Zn) MG capsule Take 220 mg by mouth daily.  No orders of the defined types were placed in this encounter.   Immunization History  Administered Date(s) Administered  . Influenza Split 08/20/2014  . Influenza, High Dose Seasonal PF 10/06/2013  . Influenza-Unspecified 10/17/2017, 08/29/2018, 08/27/2019  . PPD Test 11/16/2016, 12/05/2016  . Pneumococcal Conjugate-13 10/17/2017  . Pneumococcal Polysaccharide-23 09/24/2009  . Tdap 10/18/2017    Social History   Tobacco Use  . Smoking status: Former Smoker    Packs/day: 1.00    Years: 25.00    Pack years: 25.00    Types: Cigarettes  . Smokeless tobacco: Never Used  . Tobacco comment: over a ppd for 40+ years; quit 10/2004.  unsuccessfully tried eCigs.  Substance Use Topics  . Alcohol use: No    Alcohol/week: 1.0 standard drinks    Types: 1 Standard drinks or equivalent per week    Review of Systems  GENERAL:  no fevers, fatigue, appetite changes SKIN: No itching, rash HEENT: No complaint RESPIRATORY: + cough, no wheezing, SOB CARDIAC: No chest pain, palpitations, lower extremity edema  GI: No abdominal pain, No N/V/D or constipation, No  heartburn or reflux  GU: No dysuria, frequency or urgency, or incontinence  MUSCULOSKELETAL: No unrelieved bone/joint pain NEUROLOGIC: No headache, dizziness  PSYCHIATRIC: No overt anxiety or sadness  Vitals:   11/24/19 1526  BP: 135/78  Pulse: 69  Resp: 18  Temp: 97.6 F (36.4 C)   Body mass index is 19.86 kg/m. Physical Exam  GENERAL APPEARANCE: Alert, conversant, No acute distress  SKIN: No diaphoresis rash HEENT: Unremarkable RESPIRATORY: Breathing is even, unlabored. Lung sounds are rales on the right with a very prolonged expiratory phase but no wheezing per se, wet cough; bedside O2 saturation 93 to 94% room air CARDIOVASCULAR: Heart RRR no murmurs, rubs or gallops. No peripheral edema  GASTROINTESTINAL: Abdomen is soft, non-tender, not distended w/ normal bowel sounds.  GENITOURINARY: Bladder non tender, not distended  MUSCULOSKELETAL: No abnormal joints or musculature NEUROLOGIC: Cranial nerves 2-12 grossly intact. Moves all extremities PSYCHIATRIC: Mood and affect with dementia, no behavioral issues  Patient Active Problem List   Diagnosis Date Noted  . Salmonella sepsis ([place]) 10/26/2019  . Salmonella enteritis 10/26/2019  . Acute renal failure (ARF) ([place]) 10/14/2019  . Metabolic acidosis [id number]  . Acute metabolic encephalopathy Q000111Q  . Viral gastroenteritis 10/14/2019  . AKI (acute kidney injury) ([place]) 10/14/2019  . Hypertension associated with type 2 diabetes mellitus ([place]) 03/06/2019  . Dyslipidemia associated with type 2 diabetes mellitus ([place]) 03/06/2019  . Acquired hypothyroidism 03/06/2019  . Vascular dementia without behavioral disturbance ([place]) 03/06/2019  . Insomnia 08/06/2018  . GERD (gastroesophageal reflux disease) 08/04/2017  . Dysphagia 05/10/2017  . Depression 03/14/2017  . Vitamin D deficiency 01/28/2017  . UTI due to extended-spectrum beta lactamase (ESBL) producing Escherichia coli 11/18/2016  . Urinary tract infection due to  Proteus 11/18/2016  . Postoperative anemia due to acute blood loss 11/18/2016  . HTN (hypertension) 11/18/2016  . Closed right hip fracture ([place]) 11/10/2016  . Lactic acidosis 01/02/2016  . Senile dementia without behavioral disturbance ([place]) 10/06/2015  . Obesity (BMI 30-39.9) 10/06/2015  . Tobacco use disorder 03/17/2015  . Cardiomyopathy, ischemic 03/17/2015  . Allergy to ACE inhibitors 03/17/2015  . Ventricular tachycardia ([place]) 03/17/2015  . HLD (hyperlipidemia) 12/31/2014  . COPD (chronic obstructive pulmonary disease) ([place]) 12/31/2014  . Chronic cough 09/12/2013  . Memory deficit 04/23/2013  . Essential hypertension, benign 04/03/2013  . Dyslipidemia 04/03/2013  . Coronary atherosclerosis of native coronary artery 04/03/2013  .  DM2 (diabetes mellitus, type 2) ([place]) 04/02/2013  . Fibromyalgia 04/20/2008    CMP     Component Value Date/Time   NA 140 10/27/2019 0000   K 4.7 10/27/2019 0000   CL 102 10/27/2019 0000   CO2 29 (A) 10/27/2019 0000   GLUCOSE 166 (H) 10/21/2019 0521   BUN 15 10/27/2019 0000   CREATININE 0.6 10/27/2019 0000   CREATININE 0.75 10/21/2019 0521   CALCIUM 9.2 10/27/2019 0000   PROT 9.1 (H) 10/13/2019 2117   ALBUMIN 2.6 (L) 10/14/2019 0528   AST 29 10/13/2019 2117   ALT 16 10/13/2019 2117   ALKPHOS 105 10/13/2019 2117   BILITOT 0.6 10/13/2019 2117   GFRNONAA 87.12 10/27/2019 0000   GFRAA 90 10/27/2019 0000   Recent Labs    10/14/19 0528 10/15/19 0601 10/19/19 0500 10/20/19 0500 10/21/19 0521 10/27/19 0000  NA 146*  --  142 141 143 140  K 4.4  --  3.5 4.1 3.7 4.7  CL 106  --  106 104 110 102  CO2 15*  --  28 24 19* 29*  GLUCOSE 128*  --  148* 126* 166*  --   BUN 104*  --  9 10 6* 15  CREATININE 6.57*  --  0.78 0.74 0.75 0.6  CALCIUM 8.5*  --  7.5* 7.8* 8.4* 9.2  MG 1.7   < > 2.0 1.6* 1.4*  --   PHOS 7.5*  --   --   --   --   --    < > = values in this interval not displayed.   Recent Labs    04/03/19 0000 10/13/19 2117 10/14/19  0528  AST 13 29  --   ALT 5* 16  --   ALKPHOS 65 105  --   BILITOT  --  0.6  --   PROT  --  9.1*  --   ALBUMIN  --  3.5 2.6*   Recent Labs    07/01/19 0000 07/01/19 0000 10/13/19 2010 10/19/19 0500 10/20/19 0500 10/21/19 0521 10/27/19 0000  WBC 9.0   < > 25.5* 11.4* 8.7 6.6 8.0  NEUTROABS 5  --  23.2*  --   --   --  5  HGB 13.2  --  15.3* 10.3* 10.0* 10.4* 11.7*  HCT 40  --  48.1* 31.9* 31.1* 32.9* 36  MCV  --    < > 101.5* 98.5 99.0 99.7  --   PLT 156  --  230 154 174 202 329   < > = values in this interval not displayed.   No results for input(s): CHOL, LDLCALC, TRIG in the last 8760 hours.  Invalid input(s): HCL Lab Results  Component Value Date   MICROALBUR 1.2 05/01/2019   Lab Results  Component Value Date   TSH 4.91 10/21/2018   Lab Results  Component Value Date   HGBA1C 6.7 (H) 10/14/2019   Lab Results  Component Value Date   CHOL 127 10/14/2018   HDL 37 10/14/2018   LDLCALC 69 10/14/2018   TRIG 106 10/14/2018   CHOLHDL 2 12/19/2012    Significant Diagnostic Results in last 30 days:  No results found.  Assessment and Plan  COVID-19 positive/probable Covid pneumonia-obtaining a chest x-ray; if it returns positive is I suspect patient will be treated with Rocephin 1 g daily for 7 days and Zithromax full course.  Of note patient did receive her first coronavirus vaccine, Moderiba, prior to her diagnosis.    Late entry-patient's chest  x-ray was positive and patient was started on antibiotics.  Will monitor for oxygen requirement and plan Decadron if that occurs.  Full PPE was required for this visit   Hennie Duos, MD

## 2019-11-29 ENCOUNTER — Encounter: Payer: Self-pay | Admitting: Internal Medicine

## 2019-11-29 DIAGNOSIS — J1282 Pneumonia due to coronavirus disease 2019: Secondary | ICD-10-CM | POA: Insufficient documentation

## 2019-11-29 DIAGNOSIS — U071 COVID-19: Secondary | ICD-10-CM | POA: Insufficient documentation

## 2019-12-02 ENCOUNTER — Encounter: Payer: Self-pay | Admitting: Internal Medicine

## 2019-12-02 ENCOUNTER — Non-Acute Institutional Stay (SKILLED_NURSING_FACILITY): Payer: Medicare Other | Admitting: Internal Medicine

## 2019-12-02 DIAGNOSIS — R638 Other symptoms and signs concerning food and fluid intake: Secondary | ICD-10-CM

## 2019-12-02 DIAGNOSIS — J1282 Pneumonia due to coronavirus disease 2019: Secondary | ICD-10-CM

## 2019-12-02 DIAGNOSIS — U071 COVID-19: Secondary | ICD-10-CM | POA: Diagnosis not present

## 2019-12-02 NOTE — Progress Notes (Signed)
Location:  [facility] Room Number: 422-W Place of Service:  SNF (31)  Hennie Duos, MD  Patient Care Team: Hennie Duos, MD as PCP - General (Internal Medicine)  Extended Emergency Contact Information Primary Emergency Contact: [name],[name] Address: [address]          [place], [place] [address] [name] of [place] Phone: [phone] Work Phone: [phone] Mobile Phone: [phone] Relation: Daughter Secondary Emergency Contact: [name],[name]  [place] Phone: [phone] Relation: Niece    Allergies: Lisinopril, Spironolactone, Vancomycin, Donepezil, Eggs or egg-derived products, Erythromycin, Macrolides and ketolides, Penicillins, Quinapril hcl, Angiotensin receptor blockers, Lipitor [atorvastatin], Pentazocine lactate, Propoxyphene hcl, and Sulfonamide derivatives  Chief Complaint  Patient presents with  . Acute Visit    Patient seen for post COVID pneumonia and not drinking.    HPI: Patient is a 79 y.o. female who is being seen today has not eaten or drank anything in about 36 hours.  Patient was diagnosed with Covid and then Covid pneumonia several days ago.  Patient's been treated with antibiotics and has no rales or wheezing and is breathing easily.  Past Medical History:  Diagnosis Date  . Allergy to ACE inhibitors 03/17/2015  . Alzheimer disease ([place])   . Anxiety   . CAD (coronary artery disease)    a. s/p inferior STEMI 12/29/10 with rotablator atherectomy RCA 12/31/10 and DES x 2 RCA;  b. Lexiscan Myoview (02/2015): mild reversible apical anterior perfusion defect. C. cath 02/2015 50% LAD lesion with patent stent, Tx Rx    . Cardiomyopathy, ischemic 03/17/2015  . Chronic diastolic CHF (congestive heart failure) ([place]) 11/12/2016  . COPD (chronic obstructive pulmonary disease) ([place])   . Coronary atherosclerosis of native coronary artery 04/03/2013  . Dementia ([place])   . Dementia ([place])   .  Depression 03/14/2017  . Diverticulosis   . DM2 (diabetes mellitus, type 2) ([place]) 04/02/2013  . Dysphagia 05/10/2017   Minimal 6/20 - pureed diet  . Fibromyalgia 04/20/2008   Qualifier: Diagnosis of  By: Nelson-Smith CMA (AAMA), Dottie    . GERD (gastroesophageal reflux disease)   . Glaucoma   . HTN (hypertension)   . Hyperlipidemia   . Ischemic cardiomyopathy    EF 45%cath 2012 EF55% 5/12; 35% 11/14  . Memory deficit   . Nephrolithiasis   . Obesity (BMI 30-39.9) 10/06/2015  . Osteoarthritis   . Overweight(278.02)   . Respiratory arrest//ACE Inhibitor presumed cause   . Senile dementia without behavioral disturbance ([place]) 10/06/2015  . Sleep apnea   . Small bowel obstruction ([facility])    from a sigmoid stricture  . Ventricular tachycardia ([place]) 03/17/2015  . Vitamin D deficiency 01/28/2017    Past Surgical History:  Procedure Laterality Date  . bilateral tubal ligation    . CARDIAC CATHETERIZATION N/A 03/18/2015   Procedure: LEFT HEART CATH AND CORONARY ANGIOGRAPHY;  Surgeon: Lorretta Harp, MD;  Location: [facility] CATH LAB;  Service: Cardiovascular;  Laterality: N/A;  . CARDIAC SURGERY    . HIP ARTHROPLASTY Right 11/10/2016   Procedure: ARTHROPLASTY BIPOLAR HIP (HEMIARTHROPLASTY);  Surgeon: Marchia Bond, MD;  Location: [facility];  Service: Orthopedics;  Laterality: Right;  . 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 LITHOTRIPSY      Allergies as of 12/02/2019      Reactions   Lisinopril Other (See Comments)   Reaction:  Respiratory arrest    Spironolactone Anaphylaxis   Vancomycin Anaphylaxis, Rash  Donepezil Diarrhea   Eggs Or Egg-derived Products Diarrhea   Erythromycin Other (See Comments)   Reaction:  Makes pt feel weird    Macrolides And Ketolides Other (See Comments)   Reaction:  Unknown    Penicillins Itching, Other (See Comments)   Has patient had a PCN reaction causing immediate rash, facial/tongue/throat swelling, SOB or lightheadedness with  hypotension: No Has patient had a PCN reaction causing severe rash involving mucus membranes or skin necrosis: No Has patient had a PCN reaction that required hospitalization No Has patient had a PCN reaction occurring within the last 10 years: No If all of the above answers are "NO", then may proceed with Cephalosporin use.   Quinapril Hcl Itching   Angiotensin Receptor Blockers Other (See Comments)   Reaction:  Unknown    Lipitor [atorvastatin] Palpitations   Pentazocine Lactate Other (See Comments)   Reaction:  Unknown    Propoxyphene Hcl Other (See Comments)   Reaction unknown   Sulfonamide Derivatives Other (See Comments)   Reaction:  Unknown       Medication List       Accurate as of December 02, 2019  3:51 PM. If you have any questions, ask your nurse or doctor.        STOP taking these medications   ascorbic acid 500 MG tablet Commonly known as: VITAMIN C Stopped by: Inocencio Homes, MD     TAKE these medications   amLODipine 5 MG tablet Commonly known as: NORVASC Take 1 tablet (5 mg total) by mouth daily.   aspirin EC 81 MG tablet Take 81 mg by mouth daily.   atorvastatin 40 MG tablet Commonly known as: LIPITOR Take 40 mg by mouth daily.   clopidogrel 75 MG tablet Commonly known as: PLAVIX Take 75 mg by mouth daily.   feeding supplement Liqd Commonly known as: BOOST / RESOURCE BREEZE Take 1 Container by mouth 2 (two) times daily.   furosemide 20 MG tablet Commonly known as: LASIX Take 20 mg by mouth daily.   isosorbide mononitrate 30 MG 24 hr tablet Commonly known as: IMDUR Take 0.5 tablets (15 mg total) by mouth daily.   magnesium oxide 400 MG tablet Commonly known as: MAG-OX Take 400 mg by mouth daily.   memantine 10 MG tablet Commonly known as: NAMENDA Take 10 mg by mouth 2 (two) times daily.   metoprolol succinate 25 MG 24 hr tablet Commonly known as: Toprol XL Take 0.5 tablets (12.5 mg total) by mouth daily.   mometasone-formoterol  100-5 MCG/ACT Aero Commonly known as: DULERA Inhale 2 puffs into the lungs 2 (two) times daily.   NUTRITIONAL SUPPLEMENTS PO Take 1 each by mouth See admin instructions. Magic Cup with lunch and dinner meal   pantoprazole 40 MG tablet Commonly known as: PROTONIX Take 1 tablet (40 mg total) by mouth 2 (two) times daily.   sennosides-docusate sodium 8.6-50 MG tablet Commonly known as: SENOKOT-S Take 2 tablets by mouth daily.   traMADol 50 MG tablet Commonly known as: ULTRAM Take 50 mg by mouth daily.   Vitamin D3 1.25 MG (50000 UT) Caps Take 1 capsule by mouth once a week.       No orders of the defined types were placed in this encounter.   Immunization History  Administered Date(s) Administered  . Influenza Split 08/20/2014  . Influenza, High Dose Seasonal PF 10/06/2013  . Influenza-Unspecified 10/17/2017, 08/29/2018, 08/27/2019  . PPD Test 11/16/2016, 12/05/2016  . Pneumococcal Conjugate-13 10/17/2017  . Pneumococcal Polysaccharide-23  09/24/2009  . Tdap 10/18/2017    Social History   Tobacco Use  . Smoking status: Former Smoker    Packs/day: 1.00    Years: 25.00    Pack years: 25.00    Types: Cigarettes  . Smokeless tobacco: Never Used  . Tobacco comment: over a ppd for 40+ years; quit 10/2004.  unsuccessfully tried eCigs.  Substance Use Topics  . Alcohol use: No    Alcohol/week: 1.0 standard drinks    Types: 1 Standard drinks or equivalent per week    Review of Systems unable to obtain secondary to dementia; nursing-as per history present illness     Vitals:   12/02/19 1507  BP: 130/72  Pulse: 76  Resp: 18  Temp: 98 F (36.7 C)   Body mass index is 19.86 kg/m. Physical Exam  GENERAL APPEARANCE: Alert, conversant, No acute distress  SKIN: No diaphoresis rash HEENT: Unremarkable RESPIRATORY: Breathing is even, unlabored. Lung sounds are clear   CARDIOVASCULAR: Heart RRR no murmurs, rubs or gallops. No peripheral edema  GASTROINTESTINAL:  Abdomen is soft, non-tender, not distended w/ normal bowel sounds.  GENITOURINARY: Bladder non tender, not distended  MUSCULOSKELETAL: No abnormal joints or musculature NEUROLOGIC: Cranial nerves 2-12 grossly intact. Moves all extremities PSYCHIATRIC: Dementia, no behavioral issues  Patient Active Problem List   Diagnosis Date Noted  . Real time reverse transcriptase PCR positive for 2019 novel coronavirus 11/29/2019  . Pneumonia due to COVID-19 virus 11/29/2019  . Salmonella sepsis ([place]) 10/26/2019  . Salmonella enteritis 10/26/2019  . Acute renal failure (ARF) ([place]) 10/14/2019  . Metabolic acidosis [id number]  . Acute metabolic encephalopathy [id number]  . Viral gastroenteritis 10/14/2019  . AKI (acute kidney injury) ([place]) 10/14/2019  . Hypertension associated with type 2 diabetes mellitus ([place]) 03/06/2019  . Dyslipidemia associated with type 2 diabetes mellitus ([place]) 03/06/2019  . Acquired hypothyroidism 03/06/2019  . Vascular dementia without behavioral disturbance ([place]) 03/06/2019  . Insomnia 08/06/2018  . GERD (gastroesophageal reflux disease) 08/04/2017  . Dysphagia 05/10/2017  . Depression 03/14/2017  . Vitamin D deficiency 01/28/2017  . UTI due to extended-spectrum beta lactamase (ESBL) producing Escherichia coli 11/18/2016  . Urinary tract infection due to Proteus 11/18/2016  . Postoperative anemia due to acute blood loss 11/18/2016  . HTN (hypertension) 11/18/2016  . Closed right hip fracture ([place]) 11/10/2016  . Lactic acidosis 01/02/2016  . Senile dementia without behavioral disturbance ([place]) 10/06/2015  . Obesity (BMI 30-39.9) 10/06/2015  . Tobacco use disorder 03/17/2015  . Cardiomyopathy, ischemic 03/17/2015  . Allergy to ACE inhibitors 03/17/2015  . Ventricular tachycardia ([place]) 03/17/2015  . HLD (hyperlipidemia) 12/31/2014  . COPD (chronic obstructive pulmonary disease) ([place]) 12/31/2014  . Chronic cough 09/12/2013  . Memory deficit 04/23/2013  . Essential  hypertension, benign 04/03/2013  . Dyslipidemia 04/03/2013  . Coronary atherosclerosis of native coronary artery 04/03/2013  . DM2 (diabetes mellitus, type 2) ([place]) 04/02/2013  . Fibromyalgia 04/20/2008    CMP     Component Value Date/Time   NA 140 10/27/2019 0000   K 4.7 10/27/2019 0000   CL 102 10/27/2019 0000   CO2 29 (A) 10/27/2019 0000   GLUCOSE 166 (H) 10/21/2019 0521   BUN 15 10/27/2019 0000   CREATININE 0.6 10/27/2019 0000   CREATININE 0.75 10/21/2019 0521   CALCIUM 9.2 10/27/2019 0000   PROT 9.1 (H) 10/13/2019 2117   ALBUMIN 2.6 (L) 10/14/2019 0528   AST 29 10/13/2019 2117   ALT 16 10/13/2019 2117   ALKPHOS 105 10/13/2019 2117  BILITOT 0.6 10/13/2019 2117   GFRNONAA 87.12 10/27/2019 0000   GFRAA 90 10/27/2019 0000   Recent Labs    10/14/19 0528 10/15/19 0601 10/19/19 0500 10/20/19 0500 10/21/19 0521 10/27/19 0000  NA 146*  --  142 141 143 140  K 4.4  --  3.5 4.1 3.7 4.7  CL 106  --  106 104 110 102  CO2 15*  --  28 24 19* 29*  GLUCOSE 128*  --  148* 126* 166*  --   BUN 104*  --  9 10 6* 15  CREATININE 6.57*  --  0.78 0.74 0.75 0.6  CALCIUM 8.5*  --  7.5* 7.8* 8.4* 9.2  MG 1.7   < > 2.0 1.6* 1.4*  --   PHOS 7.5*  --   --   --   --   --    < > = values in this interval not displayed.   Recent Labs    04/03/19 0000 10/13/19 2117 10/14/19 0528  AST 13 29  --   ALT 5* 16  --   ALKPHOS 65 105  --   BILITOT  --  0.6  --   PROT  --  9.1*  --   ALBUMIN  --  3.5 2.6*   Recent Labs    07/01/19 0000 07/01/19 0000 10/13/19 2010 10/19/19 0500 10/20/19 0500 10/21/19 0521 10/27/19 0000  WBC 9.0   < > 25.5* 11.4* 8.7 6.6 8.0  NEUTROABS 5  --  23.2*  --   --   --  5  HGB 13.2  --  15.3* 10.3* 10.0* 10.4* 11.7*  HCT 40  --  48.1* 31.9* 31.1* 32.9* 36  MCV  --    < > 101.5* 98.5 99.0 99.7  --   PLT 156  --  230 154 174 202 329   < > = values in this interval not displayed.   No results for input(s): CHOL, LDLCALC, TRIG in the last 8760  hours.  Invalid input(s): HCL Lab Results  Component Value Date   MICROALBUR 1.2 05/01/2019   Lab Results  Component Value Date   TSH 4.91 10/21/2018   Lab Results  Component Value Date   HGBA1C 6.7 (H) 10/14/2019   Lab Results  Component Value Date   CHOL 127 10/14/2018   HDL 37 10/14/2018   LDLCALC 69 10/14/2018   TRIG 106 10/14/2018   CHOLHDL 2 12/19/2012    Significant Diagnostic Results in last 30 days:  No results found.  Assessment and Plan:  Covid with Covid pneumonia/poor p.o. intake-this is been seeing quite a bit prior; will start IV fluids with normal saline at 75 cc an hour and will continue until patient starts eating and drinking again which may be 2 days or maybe 2 weeks; continue to monitor  Full PPE was required for this visit    Hennie Duos, MD

## 2019-12-13 ENCOUNTER — Encounter: Payer: Self-pay | Admitting: Internal Medicine

## 2019-12-19 LAB — VITAMIN D 25 HYDROXY (VIT D DEFICIENCY, FRACTURES): Vit D, 25-Hydroxy: 29.51

## 2019-12-22 ENCOUNTER — Non-Acute Institutional Stay: Payer: Self-pay | Admitting: Internal Medicine

## 2019-12-22 ENCOUNTER — Encounter: Payer: Self-pay | Admitting: Internal Medicine

## 2019-12-22 ENCOUNTER — Non-Acute Institutional Stay (SKILLED_NURSING_FACILITY): Payer: Medicare Other | Admitting: Internal Medicine

## 2019-12-22 DIAGNOSIS — N179 Acute kidney failure, unspecified: Secondary | ICD-10-CM

## 2019-12-22 DIAGNOSIS — J449 Chronic obstructive pulmonary disease, unspecified: Secondary | ICD-10-CM

## 2019-12-22 DIAGNOSIS — A084 Viral intestinal infection, unspecified: Secondary | ICD-10-CM

## 2019-12-22 DIAGNOSIS — F0390 Unspecified dementia without behavioral disturbance: Secondary | ICD-10-CM

## 2019-12-22 DIAGNOSIS — U071 COVID-19: Secondary | ICD-10-CM

## 2019-12-22 DIAGNOSIS — I25118 Atherosclerotic heart disease of native coronary artery with other forms of angina pectoris: Secondary | ICD-10-CM | POA: Diagnosis not present

## 2019-12-22 DIAGNOSIS — E1169 Type 2 diabetes mellitus with other specified complication: Secondary | ICD-10-CM

## 2019-12-22 DIAGNOSIS — I10 Essential (primary) hypertension: Secondary | ICD-10-CM | POA: Diagnosis not present

## 2019-12-22 DIAGNOSIS — K219 Gastro-esophageal reflux disease without esophagitis: Secondary | ICD-10-CM

## 2019-12-22 NOTE — Progress Notes (Signed)
Location:    [facility] Room Number: [id number] Place of Service:  SNF [phone]) Provider:  Leda Min, MD  Patient Care Team: Hennie Duos, MD as PCP - General (Internal Medicine)  Extended Emergency Contact Information Primary Emergency Contact: [name],[name] Address: [address]          [place], [place] [address] [name] of [place] Phone: [phone] Work Phone: [phone] Mobile Phone: [phone] Relation: Daughter Secondary Emergency Contact: [name],[name]  [place] Phone: [phone] Relation: Niece  Code Status:  DNR Goals of care: Advanced Directive information Advanced Directives 12/22/2019  Does Patient Have a Medical Advance Directive? Yes  Type of Advance Directive Out of facility DNR (pink MOST or yellow form)  Does patient want to make changes to medical advance directive? No - Patient declined  Copy of Post in Chart? -  Would patient like information on creating a medical advance directive? -  Pre-existing out of facility DNR order (yellow form or pink MOST form) Yellow form placed in chart (order not valid for inpatient use)     Chief Complaint  Patient presents with  . Medical Management of Chronic Issues    Routine visit of medical management   Medical management of chronic medical issues including history of dementia-type 2 diabetes diet-controlled-hypertension-coronary artery disease-recent history of Covid pneumonia as well as Salmonella enteritis- HPI:  Pt is a 79 y.o. female seen today for medical management of chronic diseases.  As noted above.  Patient has had an eventful last few months.  She recently was hospitalized with Salmonella enteritis with acute kidney injury-and was treated successfully with antibiotics.-Numerous electrolyte abnormalities also resolved.  She did have an episode of coffee-ground emesis this is stabilized on  Protonix  .    Her kidney function returned to baseline and this has been stable.  After returning the facility she did test positive for Covid pneumonia which was treated with IV fluids as well as Decadron and antibiotics including Zithromax and Rocephin.  Apparently she is getting now closer to her baseline staff does feed her.  This is been complicated with her history of progressive dementia.  She actually has gained about 5 pounds over the last few weeks.  She does have a history of type 2 diabetes this is thought to be diet-controlled her CBGs are largely in the mid 100s.  In regards to hypertension blood pressures appear to be in the low [id number] to [id number] systolically generally she is on Toprol-XL 12.5 mg a day as well as Norvasc 5 mg a day and Lasix 20 mg a day.  Regards to coronary artery disease again she is on the Toprol as well as Imdur 50 mg a day Plavix and aspirin she is also on Lipitor.  She also has a history of low magnesium and continues on supplementation magnesium level was 1.3 back in December.  Currently she is lying in bed comfortably she is bright alert does follow simple verbal commands to an extent-nursing does not report any recent acute issues again she does require to be fed.       Past Medical History:  Diagnosis Date  . Allergy to ACE inhibitors 03/17/2015  . Alzheimer disease ([place] [facility])   . Anxiety   . CAD (coronary artery disease)    a. s/p inferior STEMI 12/29/10 with rotablator atherectomy RCA 12/31/10 and DES x 2 RCA;  b. Lexiscan Myoview (02/2015): mild reversible apical  anterior perfusion defect. C. cath 02/2015 50% LAD lesion with patent stent, Tx Rx    . Cardiomyopathy, ischemic 03/17/2015  . Chronic diastolic CHF (congestive heart failure) ([facility]) 11/12/2016  . COPD (chronic obstructive pulmonary disease) ([facility])   . Coronary atherosclerosis of native coronary artery 04/03/2013  . Dementia ([facility])   . Dementia ([place])   . Depression 03/14/2017  .  Diverticulosis   . DM2 (diabetes mellitus, type 2) ([facility]) 04/02/2013  . Dysphagia 05/10/2017   Minimal 6/20 - pureed diet  . Fibromyalgia 04/20/2008   Qualifier: Diagnosis of  By: Nelson-Smith CMA (AAMA), Dottie    . GERD (gastroesophageal reflux disease)   . Glaucoma   . HTN (hypertension)   . Hyperlipidemia   . Ischemic cardiomyopathy    EF 45%cath 2012 EF55% 5/12; 35% 11/14  . Memory deficit   . Nephrolithiasis   . Obesity (BMI 30-39.9) 10/06/2015  . Osteoarthritis   . Overweight(278.02)   . Respiratory arrest//ACE Inhibitor presumed cause   . Senile dementia without behavioral disturbance ([facility]) 10/06/2015  . Sleep apnea   . Small bowel obstruction ([facility])    from a sigmoid stricture  . Ventricular tachycardia ([place]) 03/17/2015  . Vitamin D deficiency 01/28/2017   Past Surgical History:  Procedure Laterality Date  . bilateral tubal ligation    . CARDIAC CATHETERIZATION N/A 03/18/2015   Procedure: LEFT HEART CATH AND CORONARY ANGIOGRAPHY;  Surgeon: Lorretta Harp, MD;  Location: [facility] CATH LAB;  Service: Cardiovascular;  Laterality: N/A;  . CARDIAC SURGERY    . HIP ARTHROPLASTY Right 11/10/2016   Procedure: ARTHROPLASTY BIPOLAR HIP (HEMIARTHROPLASTY);  Surgeon: Marchia Bond, MD;  Location: [facility];  Service: Orthopedics;  Laterality: Right;  . 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 LITHOTRIPSY      Allergies  Allergen Reactions  . Lisinopril Other (See Comments)    Reaction:  Respiratory arrest   . Spironolactone Anaphylaxis  . Vancomycin Anaphylaxis and Rash  . Donepezil Diarrhea  . Eggs Or Egg-Derived Products Diarrhea  . Erythromycin Other (See Comments)    Reaction:  Makes pt feel weird   . Macrolides And Ketolides Other (See Comments)    Reaction:  Unknown   . Penicillins Itching and Other (See Comments)    Has patient had a PCN reaction causing immediate rash, facial/tongue/throat swelling, SOB or lightheadedness with hypotension: No Has  patient had a PCN reaction causing severe rash involving mucus membranes or skin necrosis: No Has patient had a PCN reaction that required hospitalization No Has patient had a PCN reaction occurring within the last 10 years: No If all of the above answers are "NO", then may proceed with Cephalosporin use.  . Quinapril Hcl Itching  . Angiotensin Receptor Blockers Other (See Comments)    Reaction:  Unknown   . Lipitor [Atorvastatin] Palpitations  . Pentazocine Lactate Other (See Comments)    Reaction:  Unknown   . Propoxyphene Hcl Other (See Comments)    Reaction unknown  . Sulfonamide Derivatives Other (See Comments)    Reaction:  Unknown     Allergies as of 12/22/2019      Reactions   Lisinopril Other (See Comments)   Reaction:  Respiratory arrest    Spironolactone Anaphylaxis   Vancomycin Anaphylaxis, Rash   Donepezil Diarrhea   Eggs Or Egg-derived Products Diarrhea   Erythromycin Other (See Comments)   Reaction:  Makes pt feel weird    Macrolides And Ketolides Other (See Comments)   Reaction:  Unknown    Penicillins Itching, Other (See Comments)   Has patient had a PCN reaction causing immediate rash, facial/tongue/throat swelling, SOB or lightheadedness with hypotension: No Has patient had a PCN reaction causing severe rash involving mucus membranes or skin necrosis: No Has patient had a PCN reaction that required hospitalization No Has patient had a PCN reaction occurring within the last 10 years: No If all of the above answers are "NO", then may proceed with Cephalosporin use.   Quinapril Hcl Itching   Angiotensin Receptor Blockers Other (See Comments)   Reaction:  Unknown    Lipitor [atorvastatin] Palpitations   Pentazocine Lactate Other (See Comments)   Reaction:  Unknown    Propoxyphene Hcl Other (See Comments)   Reaction unknown   Sulfonamide Derivatives Other (See Comments)   Reaction:  Unknown       Medication List       Accurate as of December 22, 2019 10:25  AM. If you have any questions, ask your nurse or doctor.        amLODipine 5 MG tablet Commonly known as: NORVASC Take 1 tablet (5 mg total) by mouth daily.   aspirin EC 81 MG tablet Take 81 mg by mouth daily.   atorvastatin 40 MG tablet Commonly known as: LIPITOR Take 40 mg by mouth daily.   clopidogrel 75 MG tablet Commonly known as: PLAVIX Take 75 mg by mouth daily.   feeding supplement Liqd Commonly known as: BOOST / RESOURCE BREEZE Take 1 Container by mouth 2 (two) times daily.   furosemide 20 MG tablet Commonly known as: LASIX Take 20 mg by mouth daily.   isosorbide mononitrate 30 MG 24 hr tablet Commonly known as: IMDUR Take 0.5 tablets (15 mg total) by mouth daily.   magnesium oxide 400 MG tablet Commonly known as: MAG-OX Take 400 mg by mouth daily.   memantine 10 MG tablet Commonly known as: NAMENDA Take 10 mg by mouth 2 (two) times daily.   metoprolol succinate 25 MG 24 hr tablet Commonly known as: Toprol XL Take 0.5 tablets (12.5 mg total) by mouth daily.   mometasone-formoterol 100-5 MCG/ACT Aero Commonly known as: DULERA Inhale 2 puffs into the lungs 2 (two) times daily.   NUTRITIONAL SUPPLEMENTS PO Take 1 each by mouth See admin instructions. Magic Cup with lunch and dinner meal   pantoprazole 40 MG tablet Commonly known as: PROTONIX Take 1 tablet (40 mg total) by mouth 2 (two) times daily.   sennosides-docusate sodium 8.6-50 MG tablet Commonly known as: SENOKOT-S Take 2 tablets by mouth daily.   traMADol 50 MG tablet Commonly known as: ULTRAM Take 50 mg by mouth daily.   Vitamin D3 1.25 MG (50000 UT) Caps Take 1 capsule by mouth once a week.       Review of Systems   Is essentially unobtainable secondary to dementia please see HPI  Immunization History  Administered Date(s) Administered  . Influenza Split 08/20/2014  . Influenza, High Dose Seasonal PF 10/06/2013  . Influenza-Unspecified 10/17/2017, 08/29/2018, 08/27/2019  .  Moderna SARS-COVID-2 Vaccination 11/20/2019  . PPD Test 11/16/2016, 12/05/2016  . Pneumococcal Conjugate-13 10/17/2017  . Pneumococcal Polysaccharide-23 09/24/2009  . Tdap 10/18/2017   Pertinent  Health Maintenance Due  Topic Date Due  . FOOT EXAM  11/03/1951  . OPHTHALMOLOGY EXAM  09/08/2020 (Originally 11/03/1951)  . HEMOGLOBIN A1C  04/12/2020  . URINE MICROALBUMIN  04/30/2020  . INFLUENZA VACCINE  Completed  . DEXA SCAN  Completed  . PNA vac Low  Risk Adult  Completed   Fall Risk  04/19/2018  Falls in the past year? No   Functional Status Survey:    Vitals:   12/22/19 1013  BP: 123/77  Pulse: 75  Resp: 18  Temp: (!) 97.2 F (36.2 C)  TempSrc: Oral  SpO2: 97%  Weight: 119 lb 9.6 oz (54.3 kg)  Height: 5\' 4"  (1.626 m)   Body mass index is 20.53 kg/m. Physical Exam   In general this is an elderly female resting comfortably in bed she does not appear to be in any distress.  Her skin is warm and dry.  Eyes visual acuity appears to be intact sclera and conjunctive are clear.  Oropharynx is clear with limited exam since she did not open her mouth very wide.  Chest is clear to auscultation without any labored breathing respiratory effort is somewhat poor.  Heart is regular rate and rhythm without murmur gallop or rub she does not really have significant lower extremity edema apparent  Abdomen is soft nontender with positive bowel sounds.  Musculoskeletal Limited exam since she is in bed but appears able to move all extremities x4 with lower extremity weakness at baseline.  Neurologic appears grossly intact cranial nerves appear to be intact her speech is clear but she does not speak much.  She is alert.  Psych findings consistent with dementia and she does have some difficulty following verbal commands it is somewhat spotty she is not irritated with the exam.   Labs reviewed: Recent Labs    10/14/19 0528 10/15/19 0601 10/19/19 0500 10/19/19 0500  10/20/19 0500 10/21/19 0521 10/27/19 0000  NA 146*   < > 142   < > 141 143 140  K 4.4   < > 3.5   < > 4.1 3.7 4.7  CL 106   < > 106   < > 104 110 102  CO2 15*   < > 28   < > 24 19* 29*  GLUCOSE 128*   < > 148*  --  126* 166*  --   BUN 104*   < > 9   < > 10 6* 15  CREATININE 6.57*   < > 0.78   < > 0.74 0.75 0.6  CALCIUM 8.5*   < > 7.5*   < > 7.8* 8.4* 9.2  MG 1.7   < > 2.0  --  1.6* 1.4*  --   PHOS 7.5*  --   --   --   --   --   --    < > = values in this interval not displayed.   Recent Labs    04/03/19 0000 10/13/19 2117 10/14/19 0528  AST 13 29  --   ALT 5* 16  --   ALKPHOS 65 105  --   BILITOT  --  0.6  --   PROT  --  9.1*  --   ALBUMIN  --  3.5 2.6*   Recent Labs    07/01/19 0000 07/01/19 0000 10/13/19 2010 10/14/19 0528 10/19/19 0500 10/19/19 0500 10/20/19 0500 10/21/19 0521 10/27/19 0000  WBC 9.0  --  25.5*   < > 11.4*   < > 8.7 6.6 8.0  NEUTROABS 5  --  23.2*  --   --   --   --   --  5  HGB 13.2   < > 15.3*   < > 10.3*   < > 10.0* 10.4* 11.7*  HCT 40   < >  48.1*   < > 31.9*   < > 31.1* 32.9* 36  MCV  --   --  101.5*   < > 98.5  --  99.0 99.7  --   PLT 156   < > 230   < > 154   < > 174 202 329   < > = values in this interval not displayed.   Lab Results  Component Value Date   TSH 4.91 10/21/2018   Lab Results  Component Value Date   HGBA1C 6.7 (H) 10/14/2019   Lab Results  Component Value Date   CHOL 127 10/14/2018   HDL 37 10/14/2018   LDLCALC 69 10/14/2018   TRIG 106 10/14/2018   CHOLHDL 2 12/19/2012    Significant Diagnostic Results in last 30 days:  No results found.  Assessment/Plan  #1 history of COVID-19 positive test With pneumonia  -She was treated aggressively she did receive IV fluids secondary to poor p.o. intake received azithromycin and Rocephin as well as Decadron-at this point appears to be stable she is off isolation.  She does have to be fed appears she  actually had some mild weight gain at this point continue  supportive care and monitoring  #2 history of Salmonella enteritis again she was treated aggressively in the hospital with antibiotics-electrolyte abnormalities including acute kidney injury were addressed and have normalized-will update a BMP.  3.  History of dementia at this point continue supportive care apparently she now requires feeding-she is on Namenda 10 mg twice daily.  4.  History of type 2 diabetes at one point was on Lantus but this has been discontinued-nonetheless blood sugars appear to be stable largely in the mid 100s.  5.  History of hypertension she continues on Toprol-XL 12.5 mg a day and Norvasc 5 mg a day in addition to Lasix 20 mg a day-blood pressures generally run in the lower [id number] systolically-.  6.  History of coronary artery disease again continues on Toprol as well as Plavix and aspirin in addition to Imdur 15 mg a day she is also on Lipitor 40 mg a day.  This appears to be stable with no complaints of chest pain  7.  History of COPD she continues on Dulera twice a day she appears to be stable in this regards again was recently treated for pneumonia.  #8 history of coffee-ground emesis there have been no further episodes she is on Protonix.  9.  History of low magnesium this is being supplemented will update a magnesium level    [phone]

## 2019-12-23 ENCOUNTER — Encounter: Payer: Self-pay | Admitting: Internal Medicine

## 2019-12-23 LAB — CBC AND DIFFERENTIAL
HCT: 37 (ref 36–46)
Hemoglobin: 12.7 (ref 12.0–16.0)
Neutrophils Absolute: 5
Platelets: 217 (ref 150–399)
WBC: 8

## 2019-12-23 LAB — COMPREHENSIVE METABOLIC PANEL
Calcium: 9.6 (ref 8.7–10.7)
GFR calc Af Amer: 54.61
GFR calc non Af Amer: 47.12

## 2019-12-23 LAB — BASIC METABOLIC PANEL
BUN: 26 — AB (ref 4–21)
CO2: 25 — AB (ref 13–22)
Chloride: 97 — AB (ref 99–108)
Creatinine: 1.1 (ref 0.5–1.1)
Glucose: 140
Potassium: 4.8 (ref 3.4–5.3)
Sodium: 139 (ref 137–147)

## 2019-12-23 LAB — CBC: RBC: 3.9 (ref 3.87–5.11)

## 2019-12-24 ENCOUNTER — Other Ambulatory Visit: Payer: Self-pay | Admitting: Internal Medicine

## 2019-12-24 MED ORDER — TRAMADOL HCL 50 MG PO TABS: 50.0000 mg | ORAL_TABLET | Freq: Every day | ORAL | 0 refills | Status: DC

## 2019-12-27 ENCOUNTER — Encounter: Payer: Self-pay | Admitting: Internal Medicine

## 2019-12-27 NOTE — Assessment & Plan Note (Signed)
No known exacerbations, even with Covid; continue Dulera 100-2 puffs twice daily

## 2019-12-27 NOTE — Assessment & Plan Note (Signed)
No reports of aspiration or reflux; continue Protonix 40 mg daily

## 2019-12-27 NOTE — Progress Notes (Signed)
Location:   [facility]:   SNF  Hennie Duos, MD  Patient Care Team: Hennie Duos, MD as PCP - General (Internal Medicine)  Extended Emergency Contact Information Primary Emergency Contact: [name],[name] Address: [address]          [place], [place] [address] [name] of [place] Phone: [phone] Work Phone: [phone] Mobile Phone: [phone] Relation: Daughter Secondary Emergency Contact: [name],[name]  [place] Phone: [phone] Relation: Niece    Allergies: Lisinopril, Spironolactone, Vancomycin, Donepezil, Eggs or egg-derived products, Erythromycin, Macrolides and ketolides, Penicillins, Quinapril hcl, Angiotensin receptor blockers, Lipitor [atorvastatin], Pentazocine lactate, Propoxyphene hcl, and Sulfonamide derivatives  Chief Complaint  Patient presents with  . Medical Management of Chronic Issues    HPI: Patient is 79 y.o. female who is being seen for routine issues of COPD, GERD, and hyperlipidemia.  Past Medical History:  Diagnosis Date  . Allergy to ACE inhibitors 03/17/2015  . Alzheimer disease ([place])   . Anxiety   . CAD (coronary artery disease)    a. s/p inferior STEMI 12/29/10 with rotablator atherectomy RCA 12/31/10 and DES x 2 RCA;  b. Lexiscan Myoview (02/2015): mild reversible apical anterior perfusion defect. C. cath 02/2015 50% LAD lesion with patent stent, Tx Rx    . Cardiomyopathy, ischemic 03/17/2015  . Chronic diastolic CHF (congestive heart failure) ([place]) 11/12/2016  . COPD (chronic obstructive pulmonary disease) ([place])   . Coronary atherosclerosis of native coronary artery 04/03/2013  . Dementia ([place])   . Dementia ([place])   . Depression 03/14/2017  . Diverticulosis   . DM2 (diabetes mellitus, type 2) ([place]) 04/02/2013  . Dysphagia 05/10/2017   Minimal 6/20 - pureed diet  . Fibromyalgia 04/20/2008   Qualifier: Diagnosis of  By: Nelson-Smith CMA (AAMA), Dottie    . GERD (gastroesophageal  reflux disease)   . Glaucoma   . HTN (hypertension)   . Hyperlipidemia   . Ischemic cardiomyopathy    EF 45%cath 2012 EF55% 5/12; 35% 11/14  . Memory deficit   . Nephrolithiasis   . Obesity (BMI 30-39.9) 10/06/2015  . Osteoarthritis   . Overweight(278.02)   . Respiratory arrest//ACE Inhibitor presumed cause   . Senile dementia without behavioral disturbance ([place]) 10/06/2015  . Sleep apnea   . Small bowel obstruction (HCC)    from a sigmoid stricture  . Ventricular tachycardia ([place]) 03/17/2015  . Vitamin D deficiency 01/28/2017    Past Surgical History:  Procedure Laterality Date  . bilateral tubal ligation    . CARDIAC CATHETERIZATION N/A 03/18/2015   Procedure: LEFT HEART CATH AND CORONARY ANGIOGRAPHY;  Surgeon: Lorretta Harp, MD;  Location: [facility] CATH LAB;  Service: Cardiovascular;  Laterality: N/A;  . CARDIAC SURGERY    . HIP ARTHROPLASTY Right 11/10/2016   Procedure: ARTHROPLASTY BIPOLAR HIP (HEMIARTHROPLASTY);  Surgeon: Marchia Bond, MD;  Location: [facility];  Service: Orthopedics;  Laterality: Right;  . 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 LITHOTRIPSY      Allergies as of 12/22/2019      Reactions   Lisinopril Other (See Comments)   Reaction:  Respiratory arrest    Spironolactone Anaphylaxis   Vancomycin Anaphylaxis, Rash   Donepezil Diarrhea   Eggs Or Egg-derived Products Diarrhea   Erythromycin Other (See Comments)   Reaction:  Makes pt feel weird    Macrolides And Ketolides Other (See Comments)   Reaction:  Unknown    Penicillins Itching, Other (See Comments)  Has patient had a PCN reaction causing immediate rash, facial/tongue/throat swelling, SOB or lightheadedness with hypotension: No Has patient had a PCN reaction causing severe rash involving mucus membranes or skin necrosis: No Has patient had a PCN reaction that required hospitalization No Has patient had a PCN reaction occurring within the last 10 years: No If all of the  above answers are "NO", then may proceed with Cephalosporin use.   Quinapril Hcl Itching   Angiotensin Receptor Blockers Other (See Comments)   Reaction:  Unknown    Lipitor [atorvastatin] Palpitations   Pentazocine Lactate Other (See Comments)   Reaction:  Unknown    Propoxyphene Hcl Other (See Comments)   Reaction unknown   Sulfonamide Derivatives Other (See Comments)   Reaction:  Unknown       Medication List       Accurate as of December 22, 2019 11:59 PM. If you have any questions, ask your nurse or doctor.        amLODipine 5 MG tablet Commonly known as: NORVASC Take 1 tablet (5 mg total) by mouth daily.   aspirin EC 81 MG tablet Take 81 mg by mouth daily.   atorvastatin 40 MG tablet Commonly known as: LIPITOR Take 40 mg by mouth daily.   clopidogrel 75 MG tablet Commonly known as: PLAVIX Take 75 mg by mouth daily.   feeding supplement Liqd Commonly known as: BOOST / RESOURCE BREEZE Take 1 Container by mouth 2 (two) times daily.   furosemide 20 MG tablet Commonly known as: LASIX Take 20 mg by mouth daily.   isosorbide mononitrate 30 MG 24 hr tablet Commonly known as: IMDUR Take 0.5 tablets (15 mg total) by mouth daily.   magnesium oxide 400 MG tablet Commonly known as: MAG-OX Take 400 mg by mouth daily.   memantine 10 MG tablet Commonly known as: NAMENDA Take 10 mg by mouth 2 (two) times daily.   metoprolol succinate 25 MG 24 hr tablet Commonly known as: Toprol XL Take 0.5 tablets (12.5 mg total) by mouth daily.   mometasone-formoterol 100-5 MCG/ACT Aero Commonly known as: DULERA Inhale 2 puffs into the lungs 2 (two) times daily.   NUTRITIONAL SUPPLEMENTS PO Take 1 each by mouth See admin instructions. Magic Cup with lunch and dinner meal   pantoprazole 40 MG tablet Commonly known as: PROTONIX Take 1 tablet (40 mg total) by mouth 2 (two) times daily.   sennosides-docusate sodium 8.6-50 MG tablet Commonly known as: SENOKOT-S Take 2 tablets  by mouth daily.   traMADol 50 MG tablet Commonly known as: ULTRAM Take 50 mg by mouth daily.   Vitamin D3 1.25 MG (50000 UT) Caps Take 1 capsule by mouth once a week.       No orders of the defined types were placed in this encounter.   Immunization History  Administered Date(s) Administered  . Influenza Split 08/20/2014  . Influenza, High Dose Seasonal PF 10/06/2013  . Influenza-Unspecified 10/17/2017, 08/29/2018, 08/27/2019  . Moderna SARS-COVID-2 Vaccination 11/20/2019  . PPD Test 11/16/2016, 12/05/2016  . Pneumococcal Conjugate-13 10/17/2017  . Pneumococcal Polysaccharide-23 09/24/2009  . Tdap 10/18/2017    Social History   Tobacco Use  . Smoking status: Former Smoker    Packs/day: 1.00    Years: 25.00    Pack years: 25.00    Types: Cigarettes  . Smokeless tobacco: Never Used  . Tobacco comment: over a ppd for 40+ years; quit 10/2004.  unsuccessfully tried eCigs.  Substance Use Topics  . Alcohol use: No  Alcohol/week: 1.0 standard drinks    Types: 1 Standard drinks or equivalent per week    Review of Systems unable to obtain secondary to dementia; nursing no new concerns    Vitals:   12/27/19 2157  BP: 117/71  Pulse: 92  Resp: 18  Temp: 97.9 F (36.6 C)   There is no height or weight on file to calculate BMI. Physical Exam  GENERAL APPEARANCE: Alert, in no acute distress  SKIN: No diaphoresis rash HEENT: Unremarkable RESPIRATORY: Breathing is even, unlabored. Lung sounds are clear   CARDIOVASCULAR: Heart RRR no murmurs, rubs or gallops. No peripheral edema  GASTROINTESTINAL: Abdomen is soft, non-tender, not distended w/ normal bowel sounds.  GENITOURINARY: Bladder non tender, not distended  MUSCULOSKELETAL: No abnormal joints or musculature NEUROLOGIC: Cranial nerves 2-12 grossly intact. Moves all extremities PSYCHIATRIC: Dementia, no behavioral issues  Patient Active Problem List   Diagnosis Date Noted  . Real time reverse transcriptase  PCR positive for 2019 novel coronavirus 11/29/2019  . Pneumonia due to COVID-19 virus 11/29/2019  . Salmonella sepsis ([place]) 10/26/2019  . Salmonella enteritis 10/26/2019  . Acute renal failure (ARF) ([place]) 10/14/2019  . Metabolic acidosis Q000111Q  . Acute metabolic encephalopathy Q000111Q  . Viral gastroenteritis 10/14/2019  . AKI (acute kidney injury) ([place]) 10/14/2019  . Hypertension associated with type 2 diabetes mellitus ([place]) 03/06/2019  . Dyslipidemia associated with type 2 diabetes mellitus ([place]) 03/06/2019  . Acquired hypothyroidism 03/06/2019  . Vascular dementia without behavioral disturbance ([place]) 03/06/2019  . Insomnia 08/06/2018  . GERD (gastroesophageal reflux disease) 08/04/2017  . Dysphagia 05/10/2017  . Depression 03/14/2017  . Vitamin D deficiency 01/28/2017  . UTI due to extended-spectrum beta lactamase (ESBL) producing Escherichia coli 11/18/2016  . Urinary tract infection due to Proteus 11/18/2016  . Postoperative anemia due to acute blood loss 11/18/2016  . HTN (hypertension) 11/18/2016  . Closed right hip fracture ([place]) 11/10/2016  . Lactic acidosis 01/02/2016  . Senile dementia without behavioral disturbance ([place]) 10/06/2015  . Obesity (BMI 30-39.9) 10/06/2015  . Tobacco use disorder 03/17/2015  . Cardiomyopathy, ischemic 03/17/2015  . Allergy to ACE inhibitors 03/17/2015  . Ventricular tachycardia ([place]) 03/17/2015  . HLD (hyperlipidemia) 12/31/2014  . COPD (chronic obstructive pulmonary disease) ([place]) 12/31/2014  . Chronic cough 09/12/2013  . Memory deficit 04/23/2013  . Essential hypertension, benign 04/03/2013  . Dyslipidemia 04/03/2013  . Coronary atherosclerosis of native coronary artery 04/03/2013  . DM2 (diabetes mellitus, type 2) ([place]) 04/02/2013  . Fibromyalgia 04/20/2008    CMP     Component Value Date/Time   NA 140 10/27/2019 0000   K 4.7 10/27/2019 0000   CL 102 10/27/2019 0000   CO2 29 (A) 10/27/2019 0000   GLUCOSE 166 (H)  10/21/2019 0521   BUN 15 10/27/2019 0000   CREATININE 0.6 10/27/2019 0000   CREATININE 0.75 10/21/2019 0521   CALCIUM 9.2 10/27/2019 0000   PROT 9.1 (H) 10/13/2019 2117   ALBUMIN 2.6 (L) 10/14/2019 0528   AST 29 10/13/2019 2117   ALT 16 10/13/2019 2117   ALKPHOS 105 10/13/2019 2117   BILITOT 0.6 10/13/2019 2117   GFRNONAA 87.12 10/27/2019 0000   GFRAA 90 10/27/2019 0000   Recent Labs    10/14/19 0528 10/15/19 0601 10/19/19 0500 10/19/19 0500 10/20/19 0500 10/21/19 0521 10/27/19 0000  NA 146*   < > 142   < > 141 143 140  K 4.4   < > 3.5   < > 4.1 3.7 4.7  CL 106   < >  106   < > 104 110 102  CO2 15*   < > 28   < > 24 19* 29*  GLUCOSE 128*   < > 148*  --  126* 166*  --   BUN 104*   < > 9   < > 10 6* 15  CREATININE 6.57*   < > 0.78   < > 0.74 0.75 0.6  CALCIUM 8.5*   < > 7.5*   < > 7.8* 8.4* 9.2  MG 1.7   < > 2.0  --  1.6* 1.4*  --   PHOS 7.5*  --   --   --   --   --   --    < > = values in this interval not displayed.   Recent Labs    04/03/19 0000 10/13/19 2117 10/14/19 0528  AST 13 29  --   ALT 5* 16  --   ALKPHOS 65 105  --   BILITOT  --  0.6  --   PROT  --  9.1*  --   ALBUMIN  --  3.5 2.6*   Recent Labs    07/01/19 0000 07/01/19 0000 10/13/19 2010 10/14/19 0528 10/19/19 0500 10/19/19 0500 10/20/19 0500 10/21/19 0521 10/27/19 0000  WBC 9.0  --  25.5*   < > 11.4*   < > 8.7 6.6 8.0  NEUTROABS 5  --  23.2*  --   --   --   --   --  5  HGB 13.2   < > 15.3*   < > 10.3*   < > 10.0* 10.4* 11.7*  HCT 40   < > 48.1*   < > 31.9*   < > 31.1* 32.9* 36  MCV  --   --  101.5*   < > 98.5  --  99.0 99.7  --   PLT 156   < > 230   < > 154   < > 174 202 329   < > = values in this interval not displayed.   No results for input(s): CHOL, LDLCALC, TRIG in the last 8760 hours.  Invalid input(s): HCL Lab Results  Component Value Date   MICROALBUR 1.2 05/01/2019   Lab Results  Component Value Date   TSH 4.91 10/21/2018   Lab Results  Component Value Date   HGBA1C  6.7 (H) 10/14/2019   Lab Results  Component Value Date   CHOL 127 10/14/2018   HDL 37 10/14/2018   LDLCALC 69 10/14/2018   TRIG 106 10/14/2018   CHOLHDL 2 12/19/2012    Significant Diagnostic Results in last 30 days:  No results found.  Assessment and Plan  COPD (chronic obstructive pulmonary disease) (HCC) No known exacerbations, even with Covid; continue Dulera 100-2 puffs twice daily  GERD (gastroesophageal reflux disease) No reports of aspiration or reflux; continue Protonix 40 mg daily  Dyslipidemia associated with type 2 diabetes mellitus ([place]) Controlled; continue Lipitor 40 mg daily    Hennie Duos, MD

## 2019-12-27 NOTE — Assessment & Plan Note (Signed)
Controlled; continue Lipitor 40 mg daily

## 2020-01-01 ENCOUNTER — Other Ambulatory Visit: Payer: Self-pay | Admitting: Internal Medicine

## 2020-01-01 MED ORDER — TRAMADOL HCL 50 MG PO TABS: 50.0000 mg | ORAL_TABLET | Freq: Every day | ORAL | 0 refills | Status: DC

## 2020-01-07 ENCOUNTER — Non-Acute Institutional Stay (SKILLED_NURSING_FACILITY): Payer: Medicare Other | Admitting: Internal Medicine

## 2020-01-07 ENCOUNTER — Encounter: Payer: Self-pay | Admitting: Internal Medicine

## 2020-01-07 DIAGNOSIS — I5032 Chronic diastolic (congestive) heart failure: Secondary | ICD-10-CM | POA: Diagnosis not present

## 2020-01-07 DIAGNOSIS — I25118 Atherosclerotic heart disease of native coronary artery with other forms of angina pectoris: Secondary | ICD-10-CM | POA: Diagnosis not present

## 2020-01-07 DIAGNOSIS — I10 Essential (primary) hypertension: Secondary | ICD-10-CM | POA: Diagnosis not present

## 2020-01-07 DIAGNOSIS — Z79899 Other long term (current) drug therapy: Secondary | ICD-10-CM | POA: Diagnosis not present

## 2020-01-07 NOTE — Progress Notes (Signed)
Location:    [facility] Room Number: 202/A Place of Service:  SNF [phone]) Provider:  Henreitta Leber, MD  Patient Care Team: Hennie Duos, MD as PCP - General (Internal Medicine)  Extended Emergency Contact Information Primary Emergency Contact: [name],[name] Address: [name]          [place], [place] [address] [name] of [place] Phone: [phone] Work Phone: [phone] Mobile Phone: [phone] Relation: Daughter Secondary Emergency Contact: [name],[name]  [place] Phone: [phone] Relation: Niece  Code Status: DNR  Goals of care: Advanced Directive information Advanced Directives 01/07/2020  Does Patient Have a Medical Advance Directive? Yes  Type of Advance Directive Out of facility DNR (pink MOST or yellow form)  Does patient want to make changes to medical advance directive? No - Patient declined  Copy of [place] in Chart? -  Would patient like information on creating a medical advance directive? -  Pre-existing out of facility DNR order (yellow form or pink MOST form) Yellow form placed in chart (order not valid for inpatient use)     Chief Complaint  Patient presents with  . Acute Visit    Medication Management    HPI:  Pt is a 79 y.o. female seen today for an acute visit for medication management.  Patient has a history of coronary artery disease as well as diastolic CHF and hypertension .She is on Toprol-XL 12.5 mg extended release.  However pharmacy has left a note l saying that patient's medications need to be crushed and Toprol-XL cannot be crushed to be  effective.  Clinically patient appears to be stable heart rate appears to be controlled-as well as blood pressure with systolics ranging from [id number] up to 130 recently.  Pulses run in the 70s to 68s generally recently.  Patient has significant dementia and cannot give any review of systems but  nursing does not report any recent acute issues-she does have a history of dementia as well as type 2 diabetes which is diet-controlled in addition to coronary artery disease history of recent Covid pneumonia as well as Salmonella enteritis   Past Medical History:  Diagnosis Date  . Allergy to ACE inhibitors 03/17/2015  . Alzheimer disease ([place])   . Anxiety   . CAD (coronary artery disease)    a. s/p inferior STEMI 12/29/10 with rotablator atherectomy RCA 12/31/10 and DES x 2 RCA;  b. Lexiscan Myoview (02/2015): mild reversible apical anterior perfusion defect. C. cath 02/2015 50% LAD lesion with patent stent, Tx Rx    . Cardiomyopathy, ischemic 03/17/2015  . Chronic diastolic CHF (congestive heart failure) ([place]) 11/12/2016  . COPD (chronic obstructive pulmonary disease) ([place])   . Coronary atherosclerosis of native coronary artery 04/03/2013  . Dementia ([place])   . Dementia ([place])   . Depression 03/14/2017  . Diverticulosis   . DM2 (diabetes mellitus, type 2) ([place]) 04/02/2013  . Dysphagia 05/10/2017   Minimal 6/20 - pureed diet  . Fibromyalgia 04/20/2008   Qualifier: Diagnosis of  By: Nelson-Smith CMA (AAMA), Dottie    . GERD (gastroesophageal reflux disease)   . Glaucoma   . HTN (hypertension)   . Hyperlipidemia   . Ischemic cardiomyopathy    EF 45%cath 2012 EF55% 5/12; 35% 11/14  . Memory deficit   . Nephrolithiasis   . Obesity (BMI 30-39.9) 10/06/2015  . Osteoarthritis   . Overweight(278.02)   . Respiratory arrest//ACE Inhibitor presumed cause   .  Senile dementia without behavioral disturbance ([place]) 10/06/2015  . Sleep apnea   . Small bowel obstruction (HCC)    from a sigmoid stricture  . Ventricular tachycardia ([place]) 03/17/2015  . Vitamin D deficiency 01/28/2017   Past Surgical History:  Procedure Laterality Date  . bilateral tubal ligation    . CARDIAC CATHETERIZATION N/A 03/18/2015   Procedure: LEFT HEART CATH AND CORONARY ANGIOGRAPHY;  Surgeon: Lorretta Harp, MD;  Location[facility]  CATH LAB;  Service: Cardiovascular;  Laterality: N/A;  . CARDIAC SURGERY    . HIP ARTHROPLASTY Right 11/10/2016   Procedure: ARTHROPLASTY BIPOLAR HIP (HEMIARTHROPLASTY);  Surgeon: Marchia Bond, MD;  Location: [facility];  Service: Orthopedics;  Laterality: Right;  . 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 LITHOTRIPSY      Allergies  Allergen Reactions  . Lisinopril Other (See Comments)    Reaction:  Respiratory arrest   . Spironolactone Anaphylaxis  . Vancomycin Anaphylaxis and Rash  . Donepezil Diarrhea  . Eggs Or Egg-Derived Products Diarrhea  . Erythromycin Other (See Comments)    Reaction:  Makes pt feel weird   . Macrolides And Ketolides Other (See Comments)    Reaction:  Unknown   . Penicillins Itching and Other (See Comments)    Has patient had a PCN reaction causing immediate rash, facial/tongue/throat swelling, SOB or lightheadedness with hypotension: No Has patient had a PCN reaction causing severe rash involving mucus membranes or skin necrosis: No Has patient had a PCN reaction that required hospitalization No Has patient had a PCN reaction occurring within the last 10 years: No If all of the above answers are "NO", then may proceed with Cephalosporin use.  . Quinapril Hcl Itching  . Angiotensin Receptor Blockers Other (See Comments)    Reaction:  Unknown   . Lipitor [Atorvastatin] Palpitations  . Pentazocine Lactate Other (See Comments)    Reaction:  Unknown   . Propoxyphene Hcl Other (See Comments)    Reaction unknown  . Sulfonamide Derivatives Other (See Comments)    Reaction:  Unknown     Outpatient Encounter Medications as of 01/07/2020  Medication Sig  . amLODipine (NORVASC) 5 MG tablet Take 1 tablet (5 mg total) by mouth daily.  [place] Kitchen aspirin EC 81 MG tablet Take 81 mg by mouth daily.  Marland Kitchen atorvastatin (LIPITOR) 40 MG tablet Take 40 mg by mouth daily.   . Cholecalciferol (VITAMIN D3) 1.25 MG (50000 UT) CAPS Take 1 capsule by mouth once  a week.  . clopidogrel (PLAVIX) 75 MG tablet Take 75 mg by mouth daily.   . feeding supplement (BOOST / RESOURCE BREEZE) LIQD Take 1 Container by mouth 2 (two) times daily.  . furosemide (LASIX) 20 MG tablet Take 20 mg by mouth daily.   . isosorbide mononitrate (IMDUR) 30 MG 24 hr tablet Take 0.5 tablets (15 mg total) by mouth daily.  . magnesium oxide (MAG-OX) 400 MG tablet Take 400 mg by mouth daily.  . memantine (NAMENDA) 10 MG tablet Take 10 mg by mouth 2 (two) times daily.   . metoprolol succinate (TOPROL XL) 25 MG 24 hr tablet Take 0.5 tablets (12.5 mg total) by mouth daily.  . mometasone-formoterol (DULERA) 100-5 MCG/ACT AERO Inhale 2 puffs into the lungs 2 (two) times daily.   Marland Kitchen NUTRITIONAL SUPPLEMENTS PO Take 1 each by mouth See admin instructions. Magic Cup with lunch and dinner meal  . Omeprazole 20 MG TBEC Take 20 mg by mouth 2 (two) times daily.   Marland Kitchen  sennosides-docusate sodium (SENOKOT-S) 8.6-50 MG tablet Take 2 tablets by mouth daily.  . traMADol (ULTRAM) 50 MG tablet Take 1 tablet (50 mg total) by mouth daily.  . [DISCONTINUED] pantoprazole (PROTONIX) 40 MG tablet Take 1 tablet (40 mg total) by mouth 2 (two) times daily.   No facility-administered encounter medications on file as of 01/07/2020.    Review of Systems   Is unobtainable secondary to dementia  Immunization History  Administered Date(s) Administered  . Influenza Split 08/20/2014  . Influenza, High Dose Seasonal PF 10/06/2013  . Influenza-Unspecified 10/17/2017, 08/29/2018, 08/27/2019  . Moderna SARS-COVID-2 Vaccination 11/20/2019, 12/18/2019  . PPD Test 11/16/2016, 12/05/2016  . Pneumococcal Conjugate-13 10/17/2017  . Pneumococcal Polysaccharide-23 09/24/2009  . Tdap 10/18/2017   Pertinent  Health Maintenance Due  Topic Date Due  . FOOT EXAM  11/03/1951  . OPHTHALMOLOGY EXAM  09/08/2020 (Originally 11/03/1951)  . HEMOGLOBIN A1C  04/12/2020  . URINE MICROALBUMIN  04/30/2020  . INFLUENZA VACCINE  Completed   . DEXA SCAN  Completed  . PNA vac Low Risk Adult  Completed   Fall Risk  04/19/2018  Falls in the past year? No   Functional Status Survey:    Vitals:   01/07/20 1439  BP: 113/72  Pulse: 83  Resp: 18  Temp: (!) 97 F (36.1 C)  TempSrc: Oral  SpO2: 97%  Weight: 117 lb 3.2 oz (53.2 kg)  Height: 5\' 4"  (1.626 m)   Body mass index is 20.12 kg/m. Physical Exam   In general this is a fairly well-nourished elderly female in no distress she is confused but not agitated.  Her skin is warm and dry.  Eyes visual acuity appears to be intact sclera and conjunctive are clear.  Oropharynx appears to be clear mucous membranes moist.  Chest is clear to auscultation with poor respiratory effort she does not really follow verbal commands well there is no labored breathing.  Heart is regular rate and rhythm without murmur gallop or rub she does not really have significant edema.  Abdomen soft nontender positive bowel sounds.  Musculoskeletal appears able to move all her extremities x4 at baseline.  Neurologic appears grossly intact cranial nerves intact her speech continues to be clear.  Psych she is alert quite confused but not agitated does not really follow verbal commands.  She is quite verbal today.   Labs reviewed: Recent Labs    10/14/19 0528 10/15/19 0601 10/19/19 0500 10/19/19 0500 10/20/19 0500 10/20/19 0500 10/21/19 0521 10/27/19 0000 12/23/19 0000  NA 146*   < > 142   < > 141   < > 143 140 139  K 4.4   < > 3.5   < > 4.1   < > 3.7 4.7 4.8  CL 106   < > 106   < > 104   < > 110 102 97*  CO2 15*   < > 28   < > 24   < > 19* 29* 25*  GLUCOSE 128*   < > 148*  --  126*  --  166*  --   --   BUN 104*   < > 9   < > 10   < > 6* 15 26*  CREATININE 6.57*   < > 0.78   < > 0.74   < > 0.75 0.6 1.1  CALCIUM 8.5*   < > 7.5*   < > 7.8*   < > 8.4* 9.2 9.6  MG 1.7   < > 2.0  --  1.6*  --  1.4*  --   --   PHOS 7.5*  --   --   --   --   --   --   --   --    < > = values in this  interval not displayed.   Recent Labs    04/03/19 0000 10/13/19 2117 10/14/19 0528  AST 13 29  --   ALT 5* 16  --   ALKPHOS 65 105  --   BILITOT  --  0.6  --   PROT  --  9.1*  --   ALBUMIN  --  3.5 2.6*   Recent Labs    10/13/19 2010 10/14/19 0528 10/19/19 0500 10/19/19 0500 10/20/19 0500 10/20/19 0500 10/21/19 0521 10/27/19 0000 12/23/19 0000  WBC 25.5*   < > 11.4*   < > 8.7   < > 6.6 8.0 8.0  NEUTROABS 23.2*  --   --   --   --   --   --  5 5  HGB 15.3*   < > 10.3*   < > 10.0*   < > 10.4* 11.7* 12.7  HCT 48.1*   < > 31.9*   < > 31.1*   < > 32.9* 36 37  MCV 101.5*   < > 98.5  --  99.0  --  99.7  --   --   PLT 230   < > 154   < > 174   < > 202 329 217   < > = values in this interval not displayed.   Lab Results  Component Value Date   TSH 4.91 10/21/2018   Lab Results  Component Value Date   HGBA1C 6.7 (H) 10/14/2019   Lab Results  Component Value Date   CHOL 127 10/14/2018   HDL 37 10/14/2018   LDLCALC 69 10/14/2018   TRIG 106 10/14/2018   CHOLHDL 2 12/19/2012    Significant Diagnostic Results in last 30 days:  No results found.  Assessment/Plan  #1 history of hypertension-per pharmacy Toprol XL cannot be crushed we will switch this to Lopressor 12.5 mg a day and monitor.  She also continues on Norvasc 10 mg a day and Lasix 20 mg a day at this point blood pressure appears to be well controlled.  With recent blood pressures 113/74-126/79-130/78  2.  Coronary artery disease at this point appears stable she will be on the beta-blocker she is also on Imdur Lipitor aspirin and Plavix.  3.  History of diastolic CHF appears stable as well she is on Lasix 20 mg a day as well as the beta-blocker.-Her weight appears to be stable without evidence of significant edema  [phone]

## 2020-01-14 ENCOUNTER — Non-Acute Institutional Stay (SKILLED_NURSING_FACILITY): Payer: Medicare Other | Admitting: Internal Medicine

## 2020-01-14 ENCOUNTER — Encounter: Payer: Self-pay | Admitting: Internal Medicine

## 2020-01-14 DIAGNOSIS — E1159 Type 2 diabetes mellitus with other circulatory complications: Secondary | ICD-10-CM

## 2020-01-14 DIAGNOSIS — F0390 Unspecified dementia without behavioral disturbance: Secondary | ICD-10-CM

## 2020-01-14 DIAGNOSIS — I25118 Atherosclerotic heart disease of native coronary artery with other forms of angina pectoris: Secondary | ICD-10-CM

## 2020-01-14 DIAGNOSIS — I10 Essential (primary) hypertension: Secondary | ICD-10-CM

## 2020-01-14 DIAGNOSIS — J449 Chronic obstructive pulmonary disease, unspecified: Secondary | ICD-10-CM

## 2020-01-14 DIAGNOSIS — Z794 Long term (current) use of insulin: Secondary | ICD-10-CM

## 2020-01-14 NOTE — Progress Notes (Signed)
Location:    [facility] Room Number: [id number] Place of Service:  SNF [phone]) Provider: Leda Min, MD  Patient Care Team: Hennie Duos, MD as PCP - General (Internal Medicine)  Extended Emergency Contact Information Primary Emergency Contact: [name],[name] Address: [address]          [place], [place] [address] [name] of [place] Phone: [phone] Work Phone: [phone] Mobile Phone: [phone] Relation: Daughter Secondary Emergency Contact: [name],[name]  [place] Phone: [phone] Relation: Niece  Code Status:  DNR Goals of care: Advanced Directive information Advanced Directives 01/14/2020  Does Patient Have a Medical Advance Directive? Yes  Type of Advance Directive Out of facility DNR (pink MOST or yellow form)  Does patient want to make changes to medical advance directive? No - Patient declined  Copy of [place] in Chart? -  Would patient like information on creating a medical advance directive? -  Pre-existing out of facility DNR order (yellow form or pink MOST form) Yellow form placed in chart (order not valid for inpatient use)     Chief Complaint  Patient presents with  . Medical Management of Chronic Issues    Routine visit of medical management  Medical management of chronic medical conditions including dementia-type 2 diabetes-hypertension as well as coronary artery disease   HPI:  Pt is a 79 y.o. female seen today for medical management of chronic diseases.  As noted above.  Nursing is not reported any recent issues.  Earlier this year she was hospitalized with Salmonella enteritis with acute kidney injury-she received antibiotics and this resolved her electrolyte abnormalities also resolved.  She also had an episode of coffee-ground emesis there have been no further episodes she is on Protonix.  After coming back to the facility she did test  positive for Covid pneumonia that was treated with IV fluids as well as Decadron and antibiotics that included Rocephin and Zithromax.  She now appears to be back at her baseline.  She does have a history of progressive dementia but does appear to be doing fairly well with supportive care her weight is relatively stable it appears she is lost about 2 or 3 pounds over the past couple months but had some weight gain previous to that.  She does have a history of type 2 diabetes this is diet controlled CBGs appear to run in the 100s in the morning at times more in the 200s range later in the day-secondary to her advanced age and comorbidities we have been somewhat conservative here.  She does have a history of hypertension blood pressures appear to be stable 116/72-123/73-133/75 lately.  We recently changed her beta-blocker from extended release to Lopressor daily secondary to her medicines cannot be crushed and extended release form needed to be crushed.  Nonetheless her blood pressures and pulses appear to be stable.  She also has a history of coronary artery disease continues on Lopressor she is also on Imdur 50 mg a day Plavix and aspirin as well as Lipitor.  This is been stable.  In regards to low magnesium she is on supplementation magnesium level has normalized to 2.0 on lab done earlier this month.  Currently she is lying in bed comfortably she is bright alert confused at baseline but pleasantly so   Past Medical History:  Diagnosis Date  . Allergy to ACE inhibitors 03/17/2015  . Alzheimer disease ([facility])   . Anxiety   .  CAD (coronary artery disease)    a. s/p inferior STEMI 12/29/10 with rotablator atherectomy RCA 12/31/10 and DES x 2 RCA;  b. Lexiscan Myoview (02/2015): mild reversible apical anterior perfusion defect. C. cath 02/2015 50% LAD lesion with patent stent, Tx Rx    . Cardiomyopathy, ischemic 03/17/2015  . Chronic diastolic CHF (congestive heart failure) ([facility]) 11/12/2016  .  COPD (chronic obstructive pulmonary disease) ([facility])   . Coronary atherosclerosis of native coronary artery 04/03/2013  . Dementia ([facility])   . Dementia ([facility])   . Depression 03/14/2017  . Diverticulosis   . DM2 (diabetes mellitus, type 2) ([facility]) 04/02/2013  . Dysphagia 05/10/2017   Minimal 6/20 - pureed diet  . Fibromyalgia 04/20/2008   Qualifier: Diagnosis of  By: Nelson-Smith CMA (AAMA), Dottie    . GERD (gastroesophageal reflux disease)   . Glaucoma   . HTN (hypertension)   . Hyperlipidemia   . Ischemic cardiomyopathy    EF 45%cath 2012 EF55% 5/12; 35% 11/14  . Memory deficit   . Nephrolithiasis   . Obesity (BMI 30-39.9) 10/06/2015  . Osteoarthritis   . Overweight(278.02)   . Respiratory arrest//ACE Inhibitor presumed cause   . Senile dementia without behavioral disturbance ([facility]) 10/06/2015  . Sleep apnea   . Small bowel obstruction ([facility])    from a sigmoid stricture  . Ventricular tachycardia ([facility]) 03/17/2015  . Vitamin D deficiency 01/28/2017   Past Surgical History:  Procedure Laterality Date  . bilateral tubal ligation    . CARDIAC CATHETERIZATION N/A 03/18/2015   Procedure: LEFT HEART CATH AND CORONARY ANGIOGRAPHY;  Surgeon: Lorretta Harp, MD;  Location: [facility] CATH LAB;  Service: Cardiovascular;  Laterality: N/A;  . CARDIAC SURGERY    . HIP ARTHROPLASTY Right 11/10/2016   Procedure: ARTHROPLASTY BIPOLAR HIP (HEMIARTHROPLASTY);  Surgeon: Marchia Bond, MD;  Location: [facility];  Service: Orthopedics;  Laterality: Right;  . 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 LITHOTRIPSY      Allergies  Allergen Reactions  . Lisinopril Other (See Comments)    Reaction:  Respiratory arrest   . Spironolactone Anaphylaxis  . Vancomycin Anaphylaxis and Rash  . Donepezil Diarrhea  . Eggs Or Egg-Derived Products Diarrhea  . Erythromycin Other (See Comments)    Reaction:  Makes pt feel weird   . Macrolides And Ketolides Other (See Comments)    Reaction:  Unknown   .  Penicillins Itching and Other (See Comments)    Has patient had a PCN reaction causing immediate rash, facial/tongue/throat swelling, SOB or lightheadedness with hypotension: No Has patient had a PCN reaction causing severe rash involving mucus membranes or skin necrosis: No Has patient had a PCN reaction that required hospitalization No Has patient had a PCN reaction occurring within the last 10 years: No If all of the above answers are "NO", then may proceed with Cephalosporin use.  . Quinapril Hcl Itching  . Angiotensin Receptor Blockers Other (See Comments)    Reaction:  Unknown   . Lipitor [Atorvastatin] Palpitations  . Pentazocine Lactate Other (See Comments)    Reaction:  Unknown   . Propoxyphene Hcl Other (See Comments)    Reaction unknown  . Sulfonamide Derivatives Other (See Comments)    Reaction:  Unknown     Allergies as of 01/14/2020      Reactions   Lisinopril Other (See Comments)   Reaction:  Respiratory arrest    Spironolactone Anaphylaxis   Vancomycin Anaphylaxis, Rash   Donepezil Diarrhea   Eggs  Or Egg-derived Products Diarrhea   Erythromycin Other (See Comments)   Reaction:  Makes pt feel weird    Macrolides And Ketolides Other (See Comments)   Reaction:  Unknown    Penicillins Itching, Other (See Comments)   Has patient had a PCN reaction causing immediate rash, facial/tongue/throat swelling, SOB or lightheadedness with hypotension: No Has patient had a PCN reaction causing severe rash involving mucus membranes or skin necrosis: No Has patient had a PCN reaction that required hospitalization No Has patient had a PCN reaction occurring within the last 10 years: No If all of the above answers are "NO", then may proceed with Cephalosporin use.   Quinapril Hcl Itching   Angiotensin Receptor Blockers Other (See Comments)   Reaction:  Unknown    Lipitor [atorvastatin] Palpitations   Pentazocine Lactate Other (See Comments)   Reaction:  Unknown    Propoxyphene  Hcl Other (See Comments)   Reaction unknown   Sulfonamide Derivatives Other (See Comments)   Reaction:  Unknown       Medication List       Accurate as of January 14, 2020  4:15 PM. If you have any questions, ask your nurse or doctor.        amLODipine 5 MG tablet Commonly known as: NORVASC Take 1 tablet (5 mg total) by mouth daily.   aspirin EC 81 MG tablet Take 81 mg by mouth daily.   atorvastatin 40 MG tablet Commonly known as: LIPITOR Take 40 mg by mouth daily.   clopidogrel 75 MG tablet Commonly known as: PLAVIX Take 75 mg by mouth daily.   feeding supplement Liqd Commonly known as: BOOST / RESOURCE BREEZE Take 1 Container by mouth 2 (two) times daily.   furosemide 20 MG tablet Commonly known as: LASIX Take 20 mg by mouth daily.   isosorbide mononitrate 30 MG 24 hr tablet Commonly known as: IMDUR Take 0.5 tablets (15 mg total) by mouth daily.   magnesium oxide 400 MG tablet Commonly known as: MAG-OX Take 400 mg by mouth daily.   memantine 10 MG tablet Commonly known as: NAMENDA Take 10 mg by mouth 2 (two) times daily.   metoprolol tartrate 25 MG tablet Commonly known as: LOPRESSOR Take 12.5 mg by mouth every morning.   mometasone-formoterol 100-5 MCG/ACT Aero Commonly known as: DULERA Inhale 2 puffs into the lungs 2 (two) times daily.   NUTRITIONAL SUPPLEMENTS PO Take 1 each by mouth See admin instructions. Magic Cup with lunch and dinner meal   Omeprazole 20 MG Tbec Take 20 mg by mouth 2 (two) times daily.   sennosides-docusate sodium 8.6-50 MG tablet Commonly known as: SENOKOT-S Take 2 tablets by mouth daily.   traMADol 50 MG tablet Commonly known as: ULTRAM Take 1 tablet (50 mg total) by mouth daily.   Vitamin D3 1.25 MG (50000 UT) Caps Take 1 capsule by mouth once a week.       Review of Systems   Is unobtainable secondary to dementia please see HPI  Immunization History  Administered Date(s) Administered  . Influenza Split  08/20/2014  . Influenza, High Dose Seasonal PF 10/06/2013  . Influenza-Unspecified 10/17/2017, 08/29/2018, 08/27/2019  . Moderna SARS-COVID-2 Vaccination 11/20/2019, 12/18/2019  . PPD Test 11/16/2016, 12/05/2016  . Pneumococcal Conjugate-13 10/17/2017  . Pneumococcal Polysaccharide-23 09/24/2009  . Tdap 10/18/2017   Pertinent  Health Maintenance Due  Topic Date Due  . FOOT EXAM  11/03/1951  . OPHTHALMOLOGY EXAM  09/08/2020 (Originally 11/03/1951)  . HEMOGLOBIN A1C  04/12/2020  .  URINE MICROALBUMIN  04/30/2020  . INFLUENZA VACCINE  Completed  . DEXA SCAN  Completed  . PNA vac Low Risk Adult  Completed   Fall Risk  04/19/2018  Falls in the past year? No   Functional Status Survey:    Vitals:   01/14/20 1600  BP: 123/73  Pulse: 75  Resp: 19  Temp: 98.6 F (37 C)  TempSrc: Oral  SpO2: 97%  Weight: 116 lb 3.2 oz (52.7 kg)  Height: 5\' 4"  (1.626 m)   Body mass index is 19.95 kg/m. Physical Exam   In general this is a pleasant elderly female no distress resting comfortably in bed.  She is bright and alert talkative.  Her skin is warm and dry.  Eyes visual acuity appears to be intact sclera and conjunctive are clear.  Oropharynx is clear mucous membranes moist.  Chest is clear to auscultation there is no labored breathing.  Heart is regular rate and rhythm without murmur gallop or rub she does not have significant lower extremity edema.  Abdomen is soft nontender with positive bowel sounds.  Musculoskeletal is able to move all extremities x4 it appears at baseline.  Neurologic is grossly intact she is alert cranial nerves appear to be intact her speech is clear she is speaking quite a bit today.  Psych findings are baseline with her dementia she is pleasant follows some simple verbal commands she is not agitated is talking quite a bit today.    Labs reviewed: Recent Labs    10/14/19 0528 10/15/19 0601 10/19/19 0500 10/19/19 0500 10/20/19 0500  10/20/19 0500 10/21/19 0521 10/27/19 0000 12/23/19 0000  NA 146*   < > 142   < > 141   < > 143 140 139  K 4.4   < > 3.5   < > 4.1   < > 3.7 4.7 4.8  CL 106   < > 106   < > 104   < > 110 102 97*  CO2 15*   < > 28   < > 24   < > 19* 29* 25*  GLUCOSE 128*   < > 148*  --  126*  --  166*  --   --   BUN 104*   < > 9   < > 10   < > 6* 15 26*  CREATININE 6.57*   < > 0.78   < > 0.74   < > 0.75 0.6 1.1  CALCIUM 8.5*   < > 7.5*   < > 7.8*   < > 8.4* 9.2 9.6  MG 1.7   < > 2.0  --  1.6*  --  1.4*  --   --   PHOS 7.5*  --   --   --   --   --   --   --   --    < > = values in this interval not displayed.   Recent Labs    04/03/19 0000 10/13/19 2117 10/14/19 0528  AST 13 29  --   ALT 5* 16  --   ALKPHOS 65 105  --   BILITOT  --  0.6  --   PROT  --  9.1*  --   ALBUMIN  --  3.5 2.6*   Recent Labs    10/13/19 2010 10/14/19 0528 10/19/19 0500 10/19/19 0500 10/20/19 0500 10/20/19 0500 10/21/19 0521 10/27/19 0000 12/23/19 0000  WBC 25.5*   < > 11.4*   < > 8.7   < >  6.6 8.0 8.0  NEUTROABS 23.2*  --   --   --   --   --   --  5 5  HGB 15.3*   < > 10.3*   < > 10.0*   < > 10.4* 11.7* 12.7  HCT 48.1*   < > 31.9*   < > 31.1*   < > 32.9* 36 37  MCV 101.5*   < > 98.5  --  99.0  --  99.7  --   --   PLT 230   < > 154   < > 174   < > 202 329 217   < > = values in this interval not displayed.   Lab Results  Component Value Date   TSH 4.91 10/21/2018   Lab Results  Component Value Date   HGBA1C 6.7 (H) 10/14/2019   Lab Results  Component Value Date   CHOL 127 10/14/2018   HDL 37 10/14/2018   LDLCALC 69 10/14/2018   TRIG 106 10/14/2018   CHOLHDL 2 12/19/2012    Significant Diagnostic Results in last 30 days:  No results found.  Assessment/Plan  : #1 history of dementia-at this point continue supportive care she appears to be doing well at this point continue to monitor her weights.  She is on supplementation including boost twice daily.  Dietary has made interventions  Nursing  does not report any increased behaviors.  She is on Namenda 10 mg twice daily.  2.  History of type 2 diabetes-she had been on Lantus at 1 point this was discontinued secondary to hypoglycemia concerns.  Blood sugars in the morning appear to be stable in the 100s at times she will have 200s readings in the afternoon will update a hemoglobin A1c again are fairly conservative secondary to risk of hypoglycemia.  3.  History of hypertension this appears stable as noted above her Toprol-XL has been changed to Lopressor 12.5 mg a day she is also on Norvasc 5 mg a day and Lasix 20 mg a day.  4.  History of coronary artery disease she appears to be relatively asymptomatic she is on the beta-blocker as well as Plavix and aspirin as well as Imdur 50 mg a day and Lipitor 40 mg a day  #5 history of COPD she is on Dulera twice a day this appears stable with no recent acute episodes noted.  6.  History of coffee-ground emesis there has been no further episodes to my knowledge she is on Protonix hemoglobin has shown stability was 12.7 on lab done on February 2.  7.  History of Salmonella enteritis-she did require hospitalization with antibiotics-but this is all normalized and she has been stable and back at baseline.  8.  History of Covid positivity-again she was treated pretty aggressively with IV fluids she also received azithromycin and Rocephin and Decadron she appears to have made a nice recovery and appears to be essentially back at her baseline.  [id number]

## 2020-01-15 LAB — HEMOGLOBIN A1C: Hemoglobin A1C: 7.5

## 2020-01-16 ENCOUNTER — Encounter: Payer: Self-pay | Admitting: Internal Medicine

## 2020-01-27 ENCOUNTER — Non-Acute Institutional Stay (SKILLED_NURSING_FACILITY): Payer: Medicare Other | Admitting: Internal Medicine

## 2020-01-27 ENCOUNTER — Encounter: Payer: Self-pay | Admitting: Internal Medicine

## 2020-01-27 DIAGNOSIS — E1165 Type 2 diabetes mellitus with hyperglycemia: Secondary | ICD-10-CM

## 2020-01-27 NOTE — Progress Notes (Signed)
Location:  [facility] Room Number: 202-W Place of Service:  SNF (31)  Hennie Duos, MD  Patient Care Team: Hennie Duos, MD as PCP - General (Internal Medicine)  Extended Emergency Contact Information Primary Emergency Contact: [name],[name] Address: [address]          [place], [place] [address] [name] of [place] Phone: [phone] Work Phone: [phone] Mobile Phone: [phone] Relation: Daughter Secondary Emergency Contact: [name],[name]  [place] Phone: [phone] Relation: Niece    Allergies: Lisinopril, Spironolactone, Vancomycin, Donepezil, Eggs or egg-derived products, Erythromycin, Macrolides and ketolides, Penicillins, Quinapril hcl, Angiotensin receptor blockers, Lipitor [atorvastatin], Pentazocine lactate, Propoxyphene hcl, and Sulfonamide derivatives  Chief Complaint  Patient presents with  . Acute Visit    Patient seen for blood sugar control.    HPI: Patient is 79 y.o. female who problems associated with the increasing blood sugars.  Is being seen because blood sugars are creeping up according to the nurses.  Patient has had a better appetite recently.  Patient does not any complaints and the nurses do not know of any complaints  Past Medical History:  Diagnosis Date  . Allergy to ACE inhibitors 03/17/2015  . Alzheimer disease ([place])   . Anxiety   . CAD (coronary artery disease)    a. s/p inferior STEMI 12/29/10 with rotablator atherectomy RCA 12/31/10 and DES x 2 RCA;  b. Lexiscan Myoview (02/2015): mild reversible apical anterior perfusion defect. C. cath 02/2015 50% LAD lesion with patent stent, Tx Rx    . Cardiomyopathy, ischemic 03/17/2015  . Chronic diastolic CHF (congestive heart failure) ([place]) 11/12/2016  . COPD (chronic obstructive pulmonary disease) ([place])   . Coronary atherosclerosis of native coronary artery 04/03/2013  . Dementia ([place])   . Dementia ([place])   . Depression  03/14/2017  . Diverticulosis   . DM2 (diabetes mellitus, type 2) ([place]) 04/02/2013  . Dysphagia 05/10/2017   Minimal 6/20 - pureed diet  . Fibromyalgia 04/20/2008   Qualifier: Diagnosis of  By: Nelson-Smith CMA (AAMA), Dottie    . GERD (gastroesophageal reflux disease)   . Glaucoma   . HTN (hypertension)   . Hyperlipidemia   . Ischemic cardiomyopathy    EF 45%cath 2012 EF55% 5/12; 35% 11/14  . Memory deficit   . Nephrolithiasis   . Obesity (BMI 30-39.9) 10/06/2015  . Osteoarthritis   . Overweight(278.02)   . Respiratory arrest//ACE Inhibitor presumed cause   . Senile dementia without behavioral disturbance ([place]) 10/06/2015  . Sleep apnea   . Small bowel obstruction ([facility])    from a sigmoid stricture  . Ventricular tachycardia ([place]) 03/17/2015  . Vitamin D deficiency 01/28/2017    Past Surgical History:  Procedure Laterality Date  . bilateral tubal ligation    . CARDIAC CATHETERIZATION N/A 03/18/2015   Procedure: LEFT HEART CATH AND CORONARY ANGIOGRAPHY;  Surgeon: Lorretta Harp, MD;  Location: [facility] CATH LAB;  Service: Cardiovascular;  Laterality: N/A;  . CARDIAC SURGERY    . HIP ARTHROPLASTY Right 11/10/2016   Procedure: ARTHROPLASTY BIPOLAR HIP (HEMIARTHROPLASTY);  Surgeon: Marchia Bond, MD;  Location: [facility];  Service: Orthopedics;  Laterality: Right;  . 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 LITHOTRIPSY      Allergies as of 01/27/2020      Reactions   Lisinopril Other (See Comments)   Reaction:  Respiratory arrest    Spironolactone Anaphylaxis   Vancomycin Anaphylaxis, Rash   Donepezil Diarrhea  Eggs Or Egg-derived Products Diarrhea   Erythromycin Other (See Comments)   Reaction:  Makes pt feel weird    Macrolides And Ketolides Other (See Comments)   Reaction:  Unknown    Penicillins Itching, Other (See Comments)   Has patient had a PCN reaction causing immediate rash, facial/tongue/throat swelling, SOB or lightheadedness with hypotension:  No Has patient had a PCN reaction causing severe rash involving mucus membranes or skin necrosis: No Has patient had a PCN reaction that required hospitalization No Has patient had a PCN reaction occurring within the last 10 years: No If all of the above answers are "NO", then may proceed with Cephalosporin use.   Quinapril Hcl Itching   Angiotensin Receptor Blockers Other (See Comments)   Reaction:  Unknown    Lipitor [atorvastatin] Palpitations   Pentazocine Lactate Other (See Comments)   Reaction:  Unknown    Propoxyphene Hcl Other (See Comments)   Reaction unknown   Sulfonamide Derivatives Other (See Comments)   Reaction:  Unknown       Medication List       Accurate as of January 27, 2020  4:59 PM. If you have any questions, ask your nurse or doctor.        amLODipine 5 MG tablet Commonly known as: NORVASC Take 1 tablet (5 mg total) by mouth daily.   aspirin EC 81 MG tablet Take 81 mg by mouth daily.   atorvastatin 40 MG tablet Commonly known as: LIPITOR Take 40 mg by mouth daily.   clopidogrel 75 MG tablet Commonly known as: PLAVIX Take 75 mg by mouth daily.   feeding supplement Liqd Commonly known as: BOOST / RESOURCE BREEZE Take 1 Container by mouth 2 (two) times daily.   furosemide 20 MG tablet Commonly known as: LASIX Take 20 mg by mouth daily.   isosorbide mononitrate 30 MG 24 hr tablet Commonly known as: IMDUR Take 0.5 tablets (15 mg total) by mouth daily.   magnesium oxide 400 MG tablet Commonly known as: MAG-OX Take 400 mg by mouth daily.   memantine 10 MG tablet Commonly known as: NAMENDA Take 10 mg by mouth 2 (two) times daily.   metoprolol tartrate 25 MG tablet Commonly known as: LOPRESSOR Take 12.5 mg by mouth every morning.   mometasone-formoterol 100-5 MCG/ACT Aero Commonly known as: DULERA Inhale 2 puffs into the lungs 2 (two) times daily.   NUTRITIONAL SUPPLEMENTS PO Take 1 each by mouth See admin instructions. Magic Cup with  lunch and dinner meal   Omeprazole 20 MG Tbec Take 20 mg by mouth 2 (two) times daily.   sennosides-docusate sodium 8.6-50 MG tablet Commonly known as: SENOKOT-S Take 2 tablets by mouth daily.   traMADol 50 MG tablet Commonly known as: ULTRAM Take 1 tablet (50 mg total) by mouth daily.   Vitamin D3 1.25 MG (50000 UT) Caps Take 1 capsule by mouth once a week.       No orders of the defined types were placed in this encounter.   Immunization History  Administered Date(s) Administered  . Influenza Split 08/20/2014  . Influenza, High Dose Seasonal PF 10/06/2013  . Influenza-Unspecified 10/17/2017, 08/29/2018, 08/27/2019  . Moderna SARS-COVID-2 Vaccination 11/20/2019, 12/18/2019  . PPD Test 11/16/2016, 12/05/2016  . Pneumococcal Conjugate-13 10/17/2017  . Pneumococcal Polysaccharide-23 09/24/2009  . Tdap 10/18/2017    Social History   Tobacco Use  . Smoking status: Former Smoker    Packs/day: 1.00    Years: 25.00    Pack years: 25.00  Types: Cigarettes  . Smokeless tobacco: Never Used  . Tobacco comment: over a ppd for 40+ years; quit 10/2004.  unsuccessfully tried eCigs.  Substance Use Topics  . Alcohol use: No    Alcohol/week: 1.0 standard drinks    Types: 1 Standard drinks or equivalent per week    Review of Systems unable to obtain secondary to dementia     Vitals:   01/27/20 1231  BP: 121/71  Pulse: 74  Resp: 19  Temp: 98.5 F (36.9 C)   Body mass index is 19.95 kg/m. Physical Exam  GENERAL APPEARANCE: Alert, moderately conversant, No acute distress  SKIN: No diaphoresis rash HEENT: Unremarkable RESPIRATORY: Breathing is even, unlabored. Lung sounds are clear   CARDIOVASCULAR: Heart RRR no murmurs, rubs or gallops. No peripheral edema  GASTROINTESTINAL: Abdomen is soft, non-tender, not distended w/ normal bowel sounds.  GENITOURINARY: Bladder non tender, not distended  MUSCULOSKELETAL: No abnormal joints or musculature NEUROLOGIC: Cranial  nerves 2-12 grossly intact. Moves all extremities PSYCHIATRIC: Mood and affect with dementia, no behavioral issues  Patient Active Problem List   Diagnosis Date Noted  . Real time reverse transcriptase PCR positive for 2019 novel coronavirus 11/29/2019  . Pneumonia due to COVID-19 virus 11/29/2019  . Salmonella sepsis ([place]) 10/26/2019  . Salmonella enteritis 10/26/2019  . Acute renal failure (ARF) ([place]) 10/14/2019  . Metabolic acidosis [id number]  . Acute metabolic encephalopathy Q000111Q  . Viral gastroenteritis 10/14/2019  . AKI (acute kidney injury) ([place]) 10/14/2019  . Hypertension associated with type 2 diabetes mellitus ([place]) 03/06/2019  . Dyslipidemia associated with type 2 diabetes mellitus ([place]) 03/06/2019  . Acquired hypothyroidism 03/06/2019  . Vascular dementia without behavioral disturbance ([place]) 03/06/2019  . Insomnia 08/06/2018  . GERD (gastroesophageal reflux disease) 08/04/2017  . Dysphagia 05/10/2017  . Depression 03/14/2017  . Vitamin D deficiency 01/28/2017  . UTI due to extended-spectrum beta lactamase (ESBL) producing Escherichia coli 11/18/2016  . Urinary tract infection due to Proteus 11/18/2016  . Postoperative anemia due to acute blood loss 11/18/2016  . HTN (hypertension) 11/18/2016  . Closed right hip fracture ([place]) 11/10/2016  . Lactic acidosis 01/02/2016  . Senile dementia without behavioral disturbance ([place]) 10/06/2015  . Obesity (BMI 30-39.9) 10/06/2015  . Tobacco use disorder 03/17/2015  . Cardiomyopathy, ischemic 03/17/2015  . Allergy to ACE inhibitors 03/17/2015  . Ventricular tachycardia ([place]) 03/17/2015  . HLD (hyperlipidemia) 12/31/2014  . COPD (chronic obstructive pulmonary disease) ([place]) 12/31/2014  . Chronic cough 09/12/2013  . Memory deficit 04/23/2013  . Essential hypertension, benign 04/03/2013  . Dyslipidemia 04/03/2013  . Coronary atherosclerosis of native coronary artery 04/03/2013  . DM2 (diabetes mellitus, type 2) ([place])  04/02/2013  . Fibromyalgia 04/20/2008    CMP     Component Value Date/Time   NA 139 12/23/2019 0000   K 4.8 12/23/2019 0000   CL 97 (A) 12/23/2019 0000   CO2 25 (A) 12/23/2019 0000   GLUCOSE 166 (H) 10/21/2019 0521   BUN 26 (A) 12/23/2019 0000   CREATININE 1.1 12/23/2019 0000   CREATININE 0.75 10/21/2019 0521   CALCIUM 9.6 12/23/2019 0000   PROT 9.1 (H) 10/13/2019 2117   ALBUMIN 2.6 (L) 10/14/2019 0528   AST 29 10/13/2019 2117   ALT 16 10/13/2019 2117   ALKPHOS 105 10/13/2019 2117   BILITOT 0.6 10/13/2019 2117   GFRNONAA 47.12 12/23/2019 0000   GFRAA 54.61 12/23/2019 0000   Recent Labs    10/14/19 0528 10/15/19 0601 10/19/19 0500 10/19/19 0500 10/20/19 0500 10/20/19 0500 10/21/19  [id number] 10/27/19 0000 12/23/19 0000  NA 146*   < > 142   < > 141   < > 143 140 139  K 4.4   < > 3.5   < > 4.1   < > 3.7 4.7 4.8  CL 106   < > 106   < > 104   < > 110 102 97*  CO2 15*   < > 28   < > 24   < > 19* 29* 25*  GLUCOSE 128*   < > 148*  --  126*  --  166*  --   --   BUN 104*   < > 9   < > 10   < > 6* 15 26*  CREATININE 6.57*   < > 0.78   < > 0.74   < > 0.75 0.6 1.1  CALCIUM 8.5*   < > 7.5*   < > 7.8*   < > 8.4* 9.2 9.6  MG 1.7   < > 2.0  --  1.6*  --  1.4*  --   --   PHOS 7.5*  --   --   --   --   --   --   --   --    < > = values in this interval not displayed.   Recent Labs    04/03/19 0000 10/13/19 2117 10/14/19 0528  AST 13 29  --   ALT 5* 16  --   ALKPHOS 65 105  --   BILITOT  --  0.6  --   PROT  --  9.1*  --   ALBUMIN  --  3.5 2.6*   Recent Labs    10/13/19 2010 10/14/19 0528 10/19/19 0500 10/19/19 0500 10/20/19 0500 10/20/19 0500 10/21/19 0521 10/27/19 0000 12/23/19 0000  WBC 25.5*   < > 11.4*   < > 8.7   < > 6.6 8.0 8.0  NEUTROABS 23.2*  --   --   --   --   --   --  5 5  HGB 15.3*   < > 10.3*   < > 10.0*   < > 10.4* 11.7* 12.7  HCT 48.1*   < > 31.9*   < > 31.1*   < > 32.9* 36 37  MCV 101.5*   < > 98.5  --  99.0  --  99.7  --   --   PLT 230   < > 154   <  > 174   < > 202 329 217   < > = values in this interval not displayed.   No results for input(s): CHOL, LDLCALC, TRIG in the last 8760 hours.  Invalid input(s): HCL Lab Results  Component Value Date   MICROALBUR 1.2 05/01/2019   Lab Results  Component Value Date   TSH 4.91 10/21/2018   Lab Results  Component Value Date   HGBA1C 7.5 01/15/2020   Lab Results  Component Value Date   CHOL 127 10/14/2018   HDL 37 10/14/2018   LDLCALC 69 10/14/2018   TRIG 106 10/14/2018   CHOLHDL 2 12/19/2012    Significant Diagnostic Results in last 30 days:  No results found.  Assessment and Plan  Diabetes mellitus with hyperglycemia-patient's blood sugars are in the 200s today now into the threes occasionally but her morning blood sugars are too low to use a long-acting insulin.  Patient's creatinine clearance is good enough to be able to use Glucophage 500 mg twice  daily.  Will monitor response    No problem-specific Assessment & Plan notes found for this encounter.   Labs/tests ordered:    Hennie Duos, MD

## 2020-01-28 LAB — LIPID PANEL
Cholesterol: 157 (ref 0–200)
HDL: 30 — AB (ref 35–70)
LDL Cholesterol: 68
LDl/HDL Ratio: 5.2
Triglycerides: 296 — AB (ref 40–160)

## 2020-01-28 LAB — CBC AND DIFFERENTIAL
HCT: 37 (ref 36–46)
Hemoglobin: 12.1 (ref 12.0–16.0)
Neutrophils Absolute: 6
Platelets: 227 (ref 150–399)
WBC: 9.7

## 2020-01-28 LAB — BASIC METABOLIC PANEL
BUN: 32 — AB (ref 4–21)
CO2: 26 — AB (ref 13–22)
Chloride: 104 (ref 99–108)
Creatinine: 1 (ref 0.5–1.1)
Glucose: 178
Potassium: 4.6 (ref 3.4–5.3)
Sodium: 141 (ref 137–147)

## 2020-01-28 LAB — VITAMIN D 25 HYDROXY (VIT D DEFICIENCY, FRACTURES): Vit D, 25-Hydroxy: 36.61

## 2020-01-28 LAB — COMPREHENSIVE METABOLIC PANEL
Calcium: 9.5 (ref 8.7–10.7)
GFR calc Af Amer: 64.14
GFR calc non Af Amer: 55.34

## 2020-01-28 LAB — TSH: TSH: 4.24 (ref 0.41–5.90)

## 2020-01-28 LAB — CBC: RBC: 3.84 — AB (ref 3.87–5.11)

## 2020-01-28 LAB — HEMOGLOBIN A1C: Hemoglobin A1C: 7.9

## 2020-02-01 ENCOUNTER — Encounter: Payer: Self-pay | Admitting: Internal Medicine

## 2020-02-04 ENCOUNTER — Encounter: Payer: Self-pay | Admitting: Internal Medicine

## 2020-02-04 ENCOUNTER — Non-Acute Institutional Stay (SKILLED_NURSING_FACILITY): Payer: Medicare Other | Admitting: Internal Medicine

## 2020-02-04 DIAGNOSIS — E118 Type 2 diabetes mellitus with unspecified complications: Secondary | ICD-10-CM

## 2020-02-04 DIAGNOSIS — IMO0002 Reserved for concepts with insufficient information to code with codable children: Secondary | ICD-10-CM

## 2020-02-04 DIAGNOSIS — I10 Essential (primary) hypertension: Secondary | ICD-10-CM | POA: Diagnosis not present

## 2020-02-04 DIAGNOSIS — I25118 Atherosclerotic heart disease of native coronary artery with other forms of angina pectoris: Secondary | ICD-10-CM | POA: Diagnosis not present

## 2020-02-04 DIAGNOSIS — F0150 Vascular dementia without behavioral disturbance: Secondary | ICD-10-CM

## 2020-02-04 DIAGNOSIS — E785 Hyperlipidemia, unspecified: Secondary | ICD-10-CM

## 2020-02-04 DIAGNOSIS — E1165 Type 2 diabetes mellitus with hyperglycemia: Secondary | ICD-10-CM

## 2020-02-04 NOTE — Progress Notes (Signed)
Location:    [facility] Room Number: [id number] Place of Service:  SNF [phone]) Provider:  Leda Min, MD  Patient Care Team: Hennie Duos, MD as PCP - General (Internal Medicine)  Extended Emergency Contact Information Primary Emergency Contact: [name],[name] Address: [address]          [place], [place] [address] [name] of [place] Phone: [phone] Work Phone: [phone] Mobile Phone: [phone] Relation: Daughter Secondary Emergency Contact: [name],[name]  [place] Phone: [phone] Relation: Niece  Code Status:  DNR Goals of care: Advanced Directive information Advanced Directives 02/04/2020  Does Patient Have a Medical Advance Directive? Yes  Type of Advance Directive Out of facility DNR (pink MOST or yellow form)  Does patient want to make changes to medical advance directive? No - Patient declined  Copy of [place] in Chart? -  Would patient like information on creating a medical advance directive? -  Pre-existing out of facility DNR order (yellow form or pink MOST form) Yellow form placed in chart (order not valid for inpatient use)     Chief Complaint  Patient presents with  . Medical Management of Chronic Issues    Routine visit of medical management  Medical management of chronic medical conditions including type 2 diabetes-dementia-coronary artery disease and hypertension  HPI:  Pt is a 79 y.o. female seen today for medical management of chronic diseases.  As noted above nursing does not report any recent acute issues.  She recently was started on Glucophage 500 mg twice daily secondary to some rising blood sugars.  She is a type II diabetic and had been taken off her medications previously secondary to stability of her blood sugars.  Blood sugars now appear to be more in the higher 100s occasionally in the 200s-again the Glucophage was just  initiated so we will give this a bit more time.  Earlier this year she was hospitalized with Salmonella enteritis with acute kidney injury-this resolved with antibiotics and fluid supplementation.  She also had coffee-ground emesis 1 episode-with no further episodes-she does continue on Prilosec  Hemoglobin has shown stability it was 12.1 on lab done in March.  She also did test positive for Covid pneumonia after coming back to the facility after hospitalization-this was treated with IV fluids and Decadron and antibiotics that included Zithromax and Rocephin and she appeared to do well with this.  Regards to dementia this appears to be progressive she does well with supportive care-she is lost a small amount of weight appears about 3 pounds over the past month she is on Magic cup as well as restorative dining.  She also has a history of coronary artery disease which is been stable on Lopressor Imdur Plavix and aspirin as well as Lipitor.  Recent lab did show a minimally elevated TSH of 4.24 will monitor this periodically she is not on any medication.     Past Medical History:  Diagnosis Date  . Allergy to ACE inhibitors 03/17/2015  . Alzheimer disease ([place])   . Anxiety   . CAD (coronary artery disease)    a. s/p inferior STEMI 12/29/10 with rotablator atherectomy RCA 12/31/10 and DES x 2 RCA;  b. Lexiscan Myoview (02/2015): mild reversible apical anterior perfusion defect. C. cath 02/2015 50% LAD lesion with patent stent, Tx Rx    . Cardiomyopathy, ischemic 03/17/2015  . Chronic diastolic CHF (congestive heart failure) ([place]) 11/12/2016  . COPD (chronic  obstructive pulmonary disease) ([place])   . Coronary atherosclerosis of native coronary artery 04/03/2013  . Dementia ([place])   . Dementia ([place])   . Depression 03/14/2017  . Diverticulosis   . DM2 (diabetes mellitus, type 2) ([facility]) 04/02/2013  . Dysphagia 05/10/2017   Minimal 6/20 - pureed diet  . Fibromyalgia 04/20/2008   Qualifier: Diagnosis of   By: Nelson-Smith CMA (AAMA), Dottie    . GERD (gastroesophageal reflux disease)   . Glaucoma   . HTN (hypertension)   . Hyperlipidemia   . Ischemic cardiomyopathy    EF 45%cath 2012 EF55% 5/12; 35% 11/14  . Memory deficit   . Nephrolithiasis   . Obesity (BMI 30-39.9) 10/06/2015  . Osteoarthritis   . Overweight(278.02)   . Respiratory arrest//ACE Inhibitor presumed cause   . Senile dementia without behavioral disturbance ([place]) 10/06/2015  . Sleep apnea   . Small bowel obstruction ([facility])    from a sigmoid stricture  . Ventricular tachycardia ([place]) 03/17/2015  . Vitamin D deficiency 01/28/2017   Past Surgical History:  Procedure Laterality Date  . bilateral tubal ligation    . CARDIAC CATHETERIZATION N/A 03/18/2015   Procedure: LEFT HEART CATH AND CORONARY ANGIOGRAPHY;  Surgeon: Lorretta Harp, MD;  [facility];  Service: Cardiovascular;  Laterality: N/A;  . CARDIAC SURGERY    . HIP ARTHROPLASTY Right 11/10/2016   Procedure: ARTHROPLASTY BIPOLAR HIP (HEMIARTHROPLASTY);  Surgeon: Marchia Bond, MD;  Location: [facility];  Service: Orthopedics;  Laterality: Right;  . 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 LITHOTRIPSY      Allergies  Allergen Reactions  . Lisinopril Other (See Comments)    Reaction:  Respiratory arrest   . Spironolactone Anaphylaxis  . Vancomycin Anaphylaxis and Rash  . Donepezil Diarrhea  . Eggs Or Egg-Derived Products Diarrhea  . Erythromycin Other (See Comments)    Reaction:  Makes pt feel weird   . Macrolides And Ketolides Other (See Comments)    Reaction:  Unknown   . Penicillins Itching and Other (See Comments)    Has patient had a PCN reaction causing immediate rash, facial/tongue/throat swelling, SOB or lightheadedness with hypotension: No Has patient had a PCN reaction causing severe rash involving mucus membranes or skin necrosis: No Has patient had a PCN reaction that required hospitalization No Has patient had a  PCN reaction occurring within the last 10 years: No If all of the above answers are "NO", then may proceed with Cephalosporin use.  . Quinapril Hcl Itching  . Angiotensin Receptor Blockers Other (See Comments)    Reaction:  Unknown   . Lipitor [Atorvastatin] Palpitations  . Pentazocine Lactate Other (See Comments)    Reaction:  Unknown   . Propoxyphene Hcl Other (See Comments)    Reaction unknown  . Sulfonamide Derivatives Other (See Comments)    Reaction:  Unknown     Allergies as of 02/04/2020      Reactions   Lisinopril Other (See Comments)   Reaction:  Respiratory arrest    Spironolactone Anaphylaxis   Vancomycin Anaphylaxis, Rash   Donepezil Diarrhea   Eggs Or Egg-derived Products Diarrhea   Erythromycin Other (See Comments)   Reaction:  Makes pt feel weird    Macrolides And Ketolides Other (See Comments)   Reaction:  Unknown    Penicillins Itching, Other (See Comments)   Has patient had a PCN reaction causing immediate rash, facial/tongue/throat swelling, SOB or lightheadedness with hypotension: No Has patient had a PCN reaction  causing severe rash involving mucus membranes or skin necrosis: No Has patient had a PCN reaction that required hospitalization No Has patient had a PCN reaction occurring within the last 10 years: No If all of the above answers are "NO", then may proceed with Cephalosporin use.   Quinapril Hcl Itching   Angiotensin Receptor Blockers Other (See Comments)   Reaction:  Unknown    Lipitor [atorvastatin] Palpitations   Pentazocine Lactate Other (See Comments)   Reaction:  Unknown    Propoxyphene Hcl Other (See Comments)   Reaction unknown   Sulfonamide Derivatives Other (See Comments)   Reaction:  Unknown       Medication List       Accurate as of February 04, 2020 10:07 AM. If you have any questions, ask your nurse or doctor.        amLODipine 5 MG tablet Commonly known as: NORVASC Take 1 tablet (5 mg total) by mouth daily.   aspirin EC  81 MG tablet Take 81 mg by mouth daily.   atorvastatin 40 MG tablet Commonly known as: LIPITOR Take 40 mg by mouth daily.   clopidogrel 75 MG tablet Commonly known as: PLAVIX Take 75 mg by mouth daily.   feeding supplement Liqd Commonly known as: BOOST / RESOURCE BREEZE Take 1 Container by mouth 3 (three) times daily.   furosemide 20 MG tablet Commonly known as: LASIX Take 20 mg by mouth daily.   isosorbide mononitrate 30 MG 24 hr tablet Commonly known as: IMDUR Take 0.5 tablets (15 mg total) by mouth daily.   magnesium oxide 400 MG tablet Commonly known as: MAG-OX Take 400 mg by mouth daily.   memantine 10 MG tablet Commonly known as: NAMENDA Take 10 mg by mouth 2 (two) times daily.   metoprolol tartrate 25 MG tablet Commonly known as: LOPRESSOR Take 12.5 mg by mouth every morning.   mometasone-formoterol 100-5 MCG/ACT Aero Commonly known as: DULERA Inhale 2 puffs into the lungs 2 (two) times daily.   NON FORMULARY DIET: Regular   NUTRITIONAL SUPPLEMENTS PO Take 1 each by mouth See admin instructions. Magic Cup with lunch and dinner meal   Omeprazole 20 MG Tbec Take 20 mg by mouth 2 (two) times daily.   sennosides-docusate sodium 8.6-50 MG tablet Commonly known as: SENOKOT-S Take 2 tablets by mouth daily.   traMADol 50 MG tablet Commonly known as: ULTRAM Take 1 tablet (50 mg total) by mouth daily.   Vitamin D3 1.25 MG (50000 UT) Caps Take 1 capsule by mouth once a week.       Review of Systems   Largely unobtainable secondary to dementia please see HPI  Immunization History  Administered Date(s) Administered  . Influenza Split 08/20/2014  . Influenza, High Dose Seasonal PF 10/06/2013  . Influenza-Unspecified 10/17/2017, 08/29/2018, 08/27/2019  . Moderna SARS-COVID-2 Vaccination 11/20/2019, 12/18/2019  . PPD Test 11/16/2016, 12/05/2016  . Pneumococcal Conjugate-13 10/17/2017  . Pneumococcal Polysaccharide-23 09/24/2009  . Tdap 10/18/2017    Pertinent  Health Maintenance Due  Topic Date Due  . FOOT EXAM  Never done  . OPHTHALMOLOGY EXAM  09/08/2020 (Originally 11/03/1951)  . URINE MICROALBUMIN  04/30/2020  . HEMOGLOBIN A1C  07/14/2020  . INFLUENZA VACCINE  Completed  . DEXA SCAN  Completed  . PNA vac Low Risk Adult  Completed   Fall Risk  04/19/2018  Falls in the past year? No   Functional Status Survey:    Vitals:   02/04/20 0953  BP: 126/74  Pulse: 85  Resp: 20  Temp: 98.5 F (36.9 C)  TempSrc: Oral  SpO2: 97%  Weight: 113 lb 1.6 oz (51.3 kg)  Height: 5\' 4"  (1.626 m)   Body mass index is 19.41 kg/m. Physical Exam   General this is a pleasant elderly female in no distress.  Her skin is warm and dry  Eyes visual acuity appears intact sclera and conjunctive are clear.  Oropharynx is clear mucous membranes moist.  Chest is clear to auscultation without labored breathing.  Heart is regular rate and rhythm without murmur gallop or rub she does not really have any significant edema.  Abdomen is soft nontender with positive bowel sounds musculoskeletal moves all extremities x4 at baseline largely ambulates in wheelchair.  Musculoskeletal moves all extremities x4 strength appears to be intact all 4 extremities.  Neurologic is grossly intact without lateralizing findings her speech is clear.  Psych she appears to baseline with previous exam she does follow simple verbal commands at times she is cooperative with exam she is oriented to self only.  Is pretty talkative today  Labs reviewed:  January 28, 2020.  Sodium 141 potassium 4.6 BUN 31.6 creatinine 0.98.  TSH 4.24.  Hemoglobin A1c 7.9.  WBC 9.7 hemoglobin 12.1 platelets 227.  LDL was 68-vitamin D level was 36.6 Recent Labs    10/14/19 0528 10/15/19 0601 10/19/19 0500 10/19/19 0500 10/20/19 0500 10/20/19 0500 10/21/19 0521 10/27/19 0000 12/23/19 0000  NA 146*   < > 142   < > 141   < > 143 140 139  K 4.4   < > 3.5   < > 4.1   < >  3.7 4.7 4.8  CL 106   < > 106   < > 104   < > 110 102 97*  CO2 15*   < > 28   < > 24   < > 19* 29* 25*  GLUCOSE 128*   < > 148*  --  126*  --  166*  --   --   BUN 104*   < > 9   < > 10   < > 6* 15 26*  CREATININE 6.57*   < > 0.78   < > 0.74   < > 0.75 0.6 1.1  CALCIUM 8.5*   < > 7.5*   < > 7.8*   < > 8.4* 9.2 9.6  MG 1.7   < > 2.0  --  1.6*  --  1.4*  --   --   PHOS 7.5*  --   --   --   --   --   --   --   --    < > = values in this interval not displayed.   Recent Labs    04/03/19 0000 10/13/19 2117 10/14/19 0528  AST 13 29  --   ALT 5* 16  --   ALKPHOS 65 105  --   BILITOT  --  0.6  --   PROT  --  9.1*  --   ALBUMIN  --  3.5 2.6*   Recent Labs    10/13/19 2010 10/14/19 0528 10/19/19 0500 10/19/19 0500 10/20/19 0500 10/20/19 0500 10/21/19 0521 10/27/19 0000 12/23/19 0000  WBC 25.5*   < > 11.4*   < > 8.7   < > 6.6 8.0 8.0  NEUTROABS 23.2*  --   --   --   --   --   --  5 5  HGB 15.3*   < >  10.3*   < > 10.0*   < > 10.4* 11.7* 12.7  HCT 48.1*   < > 31.9*   < > 31.1*   < > 32.9* 36 37  MCV 101.5*   < > 98.5  --  99.0  --  99.7  --   --   PLT 230   < > 154   < > 174   < > 202 329 217   < > = values in this interval not displayed.   Lab Results  Component Value Date   TSH 4.91 10/21/2018   Lab Results  Component Value Date   HGBA1C 7.5 01/15/2020   Lab Results  Component Value Date   CHOL 127 10/14/2018   HDL 37 10/14/2018   LDLCALC 69 10/14/2018   TRIG 106 10/14/2018   CHOLHDL 2 12/19/2012    Significant Diagnostic Results in last 30 days:  No results found.  Assessment/Plan  #1 history of type 2 diabetes-she has been started on Glucophage 500 mg twice daily-as noted above CBGs appear to be more in the higher 100s with occasionally above 200-at this point will give a little more time for Glucophage therapy before making any adjustments.  Recent hemoglobin A1c was 7.9.  2.  History of hypertension appears stable with recent blood pressures  126/74-130/72-125/75.  She is on Lopressor 12.5 mg a day as well as Norvasc 5 mg a day and Lasix 20 mg a day-recent metabolic panel showed stability.  3.  History of dementia at this point continue supportive care she is on dietary supplementation with a recent history of some  weight loss. She continues on boost 3 times a day Magic cup twice daily and is in restorative dining  Nursing does not report any increased behaviors..  She is on Namenda 10 mg twice daily  4.  History of hyperlipidemia she is on Lipitor 40 mg a day LDL was satisfactory at 68.  5.  History of coronary artery disease appears to be stable without complaints of chest pain she is on Lopressor 12.5 mg a day as well as Plavix 75 mg a day aspirin 81 mg a day and Imdur 15 mg a day as well as Lipitor 40 mg a day.  6.  History of COPD continues on Dulera twice a day this continues to be stable and relatively asymptomatic.  7.  History of coffee-ground emesis with no further episodes hemoglobin has shown stability at 12.1-she is on Prilosec.  8.  History of low magnesium she is on supplementation level was within normal range at 2.0 last month will monitor periodically.   [id number]

## 2020-02-23 ENCOUNTER — Other Ambulatory Visit: Payer: Self-pay

## 2020-03-02 ENCOUNTER — Encounter: Payer: Self-pay | Admitting: Internal Medicine

## 2020-03-02 ENCOUNTER — Non-Acute Institutional Stay (SKILLED_NURSING_FACILITY): Payer: Medicare Other | Admitting: Internal Medicine

## 2020-03-02 DIAGNOSIS — I10 Essential (primary) hypertension: Secondary | ICD-10-CM

## 2020-03-02 DIAGNOSIS — I25118 Atherosclerotic heart disease of native coronary artery with other forms of angina pectoris: Secondary | ICD-10-CM | POA: Diagnosis not present

## 2020-03-02 DIAGNOSIS — I152 Hypertension secondary to endocrine disorders: Secondary | ICD-10-CM

## 2020-03-02 DIAGNOSIS — E1159 Type 2 diabetes mellitus with other circulatory complications: Secondary | ICD-10-CM

## 2020-03-02 DIAGNOSIS — I255 Ischemic cardiomyopathy: Secondary | ICD-10-CM | POA: Diagnosis not present

## 2020-03-02 MED ORDER — TRAMADOL HCL 50 MG PO TABS: 50.0000 mg | ORAL_TABLET | Freq: Every day | ORAL | 0 refills | Status: DC

## 2020-03-02 NOTE — Progress Notes (Signed)
Location:  [facility] Room Number: 202-W Place of Service:  SNF (31)  Hennie Duos, MD  Patient Care Team: Hennie Duos, MD as PCP - General (Internal Medicine)  Extended Emergency Contact Information Primary Emergency Contact: [name],[name] Address: [address]          [place], [place] [address] [name] of [place] Phone: [phone] Work Phone: [phone] Mobile Phone: [phone] Relation: Daughter Secondary Emergency Contact: [name],[name]  [place] Phone: [phone] Relation: Niece    Allergies: Lisinopril, Spironolactone, Vancomycin, Donepezil, Eggs or egg-derived products, Erythromycin, Macrolides and ketolides, Penicillins, Quinapril hcl, Angiotensin receptor blockers, Lipitor [atorvastatin], Pentazocine lactate, Propoxyphene hcl, and Sulfonamide derivatives  Chief Complaint  Patient presents with  . Medical Management of Chronic Issues    Routine [facility] visit    HPI: Patient is a 79 y.o. female who is being seen for routine issues of coronary artery disease, hypertension, and ischemic cardiomyopathy.  Past Medical History:  Diagnosis Date  . Allergy to ACE inhibitors 03/17/2015  . Alzheimer disease ([place])   . Anxiety   . CAD (coronary artery disease)    a. s/p inferior STEMI 12/29/10 with rotablator atherectomy RCA 12/31/10 and DES x 2 RCA;  b. Lexiscan Myoview (02/2015): mild reversible apical anterior perfusion defect. C. cath 02/2015 50% LAD lesion with patent stent, Tx Rx    . Cardiomyopathy, ischemic 03/17/2015  . Chronic diastolic CHF (congestive heart failure) ([place]) 11/12/2016  . COPD (chronic obstructive pulmonary disease) ([place])   . Coronary atherosclerosis of native coronary artery 04/03/2013  . Dementia ([place])   . Dementia ([employer])   . Depression 03/14/2017  . Diverticulosis   . DM2 (diabetes mellitus, type 2) ([place]) 04/02/2013  . Dysphagia 05/10/2017   Minimal 6/20 - pureed diet   . Fibromyalgia 04/20/2008   Qualifier: Diagnosis of  By: Nelson-Smith CMA (AAMA), Dottie    . GERD (gastroesophageal reflux disease)   . Glaucoma   . HTN (hypertension)   . Hyperlipidemia   . Ischemic cardiomyopathy    EF 45%cath 2012 EF55% 5/12; 35% 11/14  . Memory deficit   . Nephrolithiasis   . Obesity (BMI 30-39.9) 10/06/2015  . Osteoarthritis   . Overweight(278.02)   . Respiratory arrest//ACE Inhibitor presumed cause   . Senile dementia without behavioral disturbance ([place]) 10/06/2015  . Sleep apnea   . Small bowel obstruction (HCC)    from a sigmoid stricture  . Ventricular tachycardia ([place]) 03/17/2015  . Vitamin D deficiency 01/28/2017    Past Surgical History:  Procedure Laterality Date  . bilateral tubal ligation    . CARDIAC CATHETERIZATION N/A 03/18/2015   Procedure: LEFT HEART CATH AND CORONARY ANGIOGRAPHY;  Surgeon: Lorretta Harp, MD;  Location: [facility] CATH LAB;  Service: Cardiovascular;  Laterality: N/A;  . CARDIAC SURGERY    . HIP ARTHROPLASTY Right 11/10/2016   Procedure: ARTHROPLASTY BIPOLAR HIP (HEMIARTHROPLASTY);  Surgeon: Marchia Bond, MD;  Location: [facility];  Service: Orthopedics;  Laterality: Right;  . 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 LITHOTRIPSY      Allergies as of 03/02/2020      Reactions   Lisinopril Other (See Comments)   Reaction:  Respiratory arrest    Spironolactone Anaphylaxis   Vancomycin Anaphylaxis, Rash   Donepezil Diarrhea   Eggs Or Egg-derived Products Diarrhea   Erythromycin Other (See Comments)   Reaction:  Makes pt feel weird    Macrolides And Ketolides  Other (See Comments)   Reaction:  Unknown    Penicillins Itching, Other (See Comments)   Has patient had a PCN reaction causing immediate rash, facial/tongue/throat swelling, SOB or lightheadedness with hypotension: No Has patient had a PCN reaction causing severe rash involving mucus membranes or skin necrosis: No Has patient had a PCN reaction  that required hospitalization No Has patient had a PCN reaction occurring within the last 10 years: No If all of the above answers are "NO", then may proceed with Cephalosporin use.   Quinapril Hcl Itching   Angiotensin Receptor Blockers Other (See Comments)   Reaction:  Unknown    Lipitor [atorvastatin] Palpitations   Pentazocine Lactate Other (See Comments)   Reaction:  Unknown    Propoxyphene Hcl Other (See Comments)   Reaction unknown   Sulfonamide Derivatives Other (See Comments)   Reaction:  Unknown       Medication List       Accurate as of March 02, 2020 11:59 PM. If you have any questions, ask your nurse or doctor.        amLODipine 5 MG tablet Commonly known as: NORVASC Take 1 tablet (5 mg total) by mouth daily.   aspirin EC 81 MG tablet Take 81 mg by mouth daily.   atorvastatin 40 MG tablet Commonly known as: LIPITOR Take 40 mg by mouth daily.   clopidogrel 75 MG tablet Commonly known as: PLAVIX Take 75 mg by mouth daily.   feeding supplement Liqd Commonly known as: BOOST / RESOURCE BREEZE Take 1 Container by mouth 3 (three) times daily.   furosemide 20 MG tablet Commonly known as: LASIX Take 20 mg by mouth daily.   isosorbide mononitrate 30 MG 24 hr tablet Commonly known as: IMDUR Take 0.5 tablets (15 mg total) by mouth daily.   magnesium oxide 400 MG tablet Commonly known as: MAG-OX Take 400 mg by mouth daily.   memantine 10 MG tablet Commonly known as: NAMENDA Take 10 mg by mouth 2 (two) times daily.   metoprolol tartrate 25 MG tablet Commonly known as: LOPRESSOR Take 12.5 mg by mouth every morning.   mometasone-formoterol 100-5 MCG/ACT Aero Commonly known as: DULERA Inhale 2 puffs into the lungs 2 (two) times daily.   NON FORMULARY DIET: Regular   NUTRITIONAL SUPPLEMENTS PO Take 1 each by mouth See admin instructions. Magic Cup with lunch and dinner meal   Omeprazole 20 MG Tbec Take 20 mg by mouth 2 (two) times daily.     sennosides-docusate sodium 8.6-50 MG tablet Commonly known as: SENOKOT-S Take 2 tablets by mouth daily.   traMADol 50 MG tablet Commonly known as: ULTRAM Take 1 tablet (50 mg total) by mouth daily.   Vitamin D3 1.25 MG (50000 UT) Caps Take 1 capsule by mouth once a week.       Meds ordered this encounter  Medications  . traMADol (ULTRAM) 50 MG tablet    Sig: Take 1 tablet (50 mg total) by mouth daily.    Dispense:  90 tablet    Refill:  0    Immunization History  Administered Date(s) Administered  . Influenza Split 08/20/2014  . Influenza, High Dose Seasonal PF 10/06/2013  . Influenza-Unspecified 10/17/2017, 08/29/2018, 08/27/2019  . Moderna SARS-COVID-2 Vaccination 11/20/2019, 12/18/2019  . PPD Test 11/16/2016, 12/05/2016  . Pneumococcal Conjugate-13 10/17/2017  . Pneumococcal Polysaccharide-23 09/24/2009  . Tdap 10/18/2017    Social History   Tobacco Use  . Smoking status: Former Smoker    Packs/day: 1.00  Years: 25.00    Pack years: 25.00    Types: Cigarettes  . Smokeless tobacco: Never Used  . Tobacco comment: over a ppd for 40+ years; quit 10/2004.  unsuccessfully tried eCigs.  Substance Use Topics  . Alcohol use: No    Alcohol/week: 1.0 standard drinks    Types: 1 Standard drinks or equivalent per week    Review of Systems  Patient cannot contribute some GENERAL:  no fevers, fatigue, appetite changes SKIN: No itching, rash HEENT: No complaint RESPIRATORY: No cough, wheezing, SOB CARDIAC: No chest pain, palpitations, lower extremity edema  GI: No abdominal pain, No N/V/D or constipation, No heartburn or reflux  GU: No dysuria, frequency or urgency, or incontinence  MUSCULOSKELETAL: No unrelieved bone/joint pain NEUROLOGIC: No headache, dizziness  PSYCHIATRIC: No overt anxiety or sadness  Vitals:   03/02/20 1510  BP: 132/74  Pulse: 63  Resp: 18  Temp: (!) 97.1 F (36.2 C)   Body mass index is 19.77 kg/m. Physical Exam  GENERAL  APPEARANCE: Alert, conversant, No acute distress  SKIN: No diaphoresis rash HEENT: Unremarkable RESPIRATORY: Breathing is even, unlabored. Lung sounds are clear   CARDIOVASCULAR: Heart RRR no murmurs, rubs or gallops. No peripheral edema  GASTROINTESTINAL: Abdomen is soft, non-tender, not distended w/ normal bowel sounds.  GENITOURINARY: Bladder non tender, not distended  MUSCULOSKELETAL: No abnormal joints or musculature NEUROLOGIC: Cranial nerves 2-12 grossly intact. Moves all extremities PSYCHIATRIC: Mood and affect with dementia, occasional behavioral issues  Patient Active Problem List   Diagnosis Date Noted  . Real time reverse transcriptase PCR positive for 2019 novel coronavirus 11/29/2019  . Pneumonia due to COVID-19 virus 11/29/2019  . Salmonella sepsis ([place]) 10/26/2019  . Salmonella enteritis 10/26/2019  . Acute renal failure (ARF) ([place]) 10/14/2019  . Metabolic acidosis [id number]  . Acute metabolic encephalopathy [id number]  . Viral gastroenteritis 10/14/2019  . AKI (acute kidney injury) ([place]) 10/14/2019  . Hypertension associated with type 2 diabetes mellitus ([place]) 03/06/2019  . Dyslipidemia associated with type 2 diabetes mellitus ([place]) 03/06/2019  . Acquired hypothyroidism 03/06/2019  . Vascular dementia without behavioral disturbance ([place]) 03/06/2019  . Insomnia 08/06/2018  . GERD (gastroesophageal reflux disease) 08/04/2017  . Dysphagia 05/10/2017  . Depression 03/14/2017  . Vitamin D deficiency 01/28/2017  . UTI due to extended-spectrum beta lactamase (ESBL) producing Escherichia coli 11/18/2016  . Urinary tract infection due to Proteus 11/18/2016  . Postoperative anemia due to acute blood loss 11/18/2016  . HTN (hypertension) 11/18/2016  . Closed right hip fracture ([place]) 11/10/2016  . Lactic acidosis 01/02/2016  . Senile dementia without behavioral disturbance ([place]) 10/06/2015  . Obesity (BMI 30-39.9) 10/06/2015  . Tobacco use disorder 03/17/2015  .  Cardiomyopathy, ischemic 03/17/2015  . Allergy to ACE inhibitors 03/17/2015  . Ventricular tachycardia ([place]) 03/17/2015  . HLD (hyperlipidemia) 12/31/2014  . COPD (chronic obstructive pulmonary disease) ([place]) 12/31/2014  . Chronic cough 09/12/2013  . Memory deficit 04/23/2013  . Essential hypertension, benign 04/03/2013  . Dyslipidemia 04/03/2013  . Coronary atherosclerosis of native coronary artery 04/03/2013  . DM2 (diabetes mellitus, type 2) ([place]) 04/02/2013  . Fibromyalgia 04/20/2008    CMP     Component Value Date/Time   NA 141 01/28/2020 0000   K 4.6 01/28/2020 0000   CL 104 01/28/2020 0000   CO2 26 (A) 01/28/2020 0000   GLUCOSE 166 (H) 10/21/2019 0521   BUN 32 (A) 01/28/2020 0000   CREATININE 1.0 01/28/2020 0000   CREATININE 0.75 10/21/2019 0521   CALCIUM 9.5  01/28/2020 0000   PROT 9.1 (H) 10/13/2019 2117   ALBUMIN 2.6 (L) 10/14/2019 0528   AST 29 10/13/2019 2117   ALT 16 10/13/2019 2117   ALKPHOS 105 10/13/2019 2117   BILITOT 0.6 10/13/2019 2117   GFRNONAA 55.34 01/28/2020 0000   GFRAA 64.14 01/28/2020 0000   Recent Labs    10/14/19 0528 10/15/19 0601 10/19/19 0500 10/19/19 0500 10/20/19 0500 10/20/19 0500 10/21/19 0521 10/21/19 0521 10/27/19 0000 12/23/19 0000 01/28/20 0000  NA 146*   < > 142   < > 141   < > 143  --  140 139 141  K 4.4   < > 3.5   < > 4.1   < > 3.7   < > 4.7 4.8 4.6  CL 106   < > 106   < > 104   < > 110   < > 102 97* 104  CO2 15*   < > 28   < > 24   < > 19*   < > 29* 25* 26*  GLUCOSE 128*   < > 148*  --  126*  --  166*  --   --   --   --   BUN 104*   < > 9   < > 10   < > 6*  --  15 26* 32*  CREATININE 6.57*   < > 0.78   < > 0.74   < > 0.75  --  0.6 1.1 1.0  CALCIUM 8.5*   < > 7.5*   < > 7.8*   < > 8.4*   < > 9.2 9.6 9.5  MG 1.7   < > 2.0  --  1.6*  --  1.4*  --   --   --   --   PHOS 7.5*  --   --   --   --   --   --   --   --   --   --    < > = values in this interval not displayed.   Recent Labs    04/03/19 0000 10/13/19 2117  10/14/19 0528  AST 13 29  --   ALT 5* 16  --   ALKPHOS 65 105  --   BILITOT  --  0.6  --   PROT  --  9.1*  --   ALBUMIN  --  3.5 2.6*   Recent Labs    07/01/19 0000 10/19/19 0500 10/19/19 0500 10/20/19 0500 10/20/19 0500 10/21/19 0521 10/27/19 0000 12/23/19 0000 01/28/20 0000  WBC   < > 11.4*   < > 8.7   < > 6.6 8.0 8.0 9.7  NEUTROABS   < >  --   --   --   --   --  5 5 6   HGB   < > 10.3*   < > 10.0*   < > 10.4* 11.7* 12.7 12.1  HCT   < > 31.9*   < > 31.1*   < > 32.9* 36 37 37  MCV  --  98.5  --  99.0  --  99.7  --   --   --   PLT   < > 154   < > 174   < > 202 329 217 227   < > = values in this interval not displayed.   Recent Labs    01/28/20 0000  CHOL 157  LDLCALC 68  TRIG 296*   Lab Results  Component  Value Date   MICROALBUR 1.2 05/01/2019   Lab Results  Component Value Date   TSH 4.24 01/28/2020   Lab Results  Component Value Date   HGBA1C 7.9 01/28/2020   Lab Results  Component Value Date   CHOL 157 01/28/2020   HDL 30 (A) 01/28/2020   LDLCALC 68 01/28/2020   TRIG 296 (A) 01/28/2020   CHOLHDL 2 12/19/2012    Significant Diagnostic Results in last 30 days:  No results found.  Assessment and Plan  Coronary atherosclerosis of native coronary artery No chest pain reported; continue Toprol-XL 12.5 mg daily, Plavix 75 mg daily, aspirin 81 mg daily, Imdur 15 mg daily, and sublingual nitroglycerin as needed; patient is on statin  Hypertension associated with type 2 diabetes mellitus (HCC) Stable and chronic; continue Toprol-XL 12.5 mg daily, Imdur 15 mg daily and Lasix 20 mg daily  Cardiomyopathy, ischemic Chronic and stable without exacerbation; continue Toprol XL 12.5 mg daily and Lasix 20 mg daily     Hennie Duos, MD

## 2020-03-07 ENCOUNTER — Encounter: Payer: Self-pay | Admitting: Internal Medicine

## 2020-03-07 NOTE — Assessment & Plan Note (Signed)
Stable and chronic; continue Toprol-XL 12.5 mg daily, Imdur 15 mg daily and Lasix 20 mg daily

## 2020-03-07 NOTE — Assessment & Plan Note (Signed)
Chronic and stable without exacerbation; continue Toprol XL 12.5 mg daily and Lasix 20 mg daily

## 2020-03-07 NOTE — Assessment & Plan Note (Addendum)
No chest pain reported; continue Toprol-XL 12.5 mg daily, Plavix 75 mg daily, aspirin 81 mg daily, Imdur 15 mg daily, and sublingual nitroglycerin as needed; patient is on statin

## 2020-03-31 ENCOUNTER — Other Ambulatory Visit: Payer: Self-pay | Admitting: Internal Medicine

## 2020-03-31 MED ORDER — TRAMADOL HCL 50 MG PO TABS: 50.0000 mg | ORAL_TABLET | Freq: Every day | ORAL | 0 refills | Status: DC

## 2020-04-07 ENCOUNTER — Non-Acute Institutional Stay (SKILLED_NURSING_FACILITY): Payer: Medicare Other | Admitting: Internal Medicine

## 2020-04-07 ENCOUNTER — Encounter: Payer: Self-pay | Admitting: Internal Medicine

## 2020-04-07 DIAGNOSIS — E1159 Type 2 diabetes mellitus with other circulatory complications: Secondary | ICD-10-CM

## 2020-04-07 DIAGNOSIS — I25118 Atherosclerotic heart disease of native coronary artery with other forms of angina pectoris: Secondary | ICD-10-CM | POA: Diagnosis not present

## 2020-04-07 DIAGNOSIS — I10 Essential (primary) hypertension: Secondary | ICD-10-CM

## 2020-04-07 DIAGNOSIS — J449 Chronic obstructive pulmonary disease, unspecified: Secondary | ICD-10-CM

## 2020-04-07 DIAGNOSIS — Z794 Long term (current) use of insulin: Secondary | ICD-10-CM

## 2020-04-07 DIAGNOSIS — F0390 Unspecified dementia without behavioral disturbance: Secondary | ICD-10-CM | POA: Diagnosis not present

## 2020-04-07 NOTE — Progress Notes (Signed)
Location:    [facility] Room Number: [id number] Place of Service:  SNF [phone]) Provider:  Leda Min, MD  Patient Care Team: Hennie Duos, MD as PCP - General (Internal Medicine)  Extended Emergency Contact Information Primary Emergency Contact: [name],[name] Address: [address]          [place], [place] [address] [name] of [place] Phone: [phone] Work Phone: [phone] Mobile Phone: [phone] Relation: Daughter Secondary Emergency Contact: [name],[name]  [place] Phone: [phone] Relation: Niece  Code Status:  DNR Goals of care: Advanced Directive information Advanced Directives 04/07/2020  Does Patient Have a Medical Advance Directive? Yes  Type of Advance Directive Out of facility DNR (pink MOST or yellow form)  Does patient want to make changes to medical advance directive? No - Patient declined  Copy of [place] in Chart? -  Would patient like information on creating a medical advance directive? -  Pre-existing out of facility DNR order (yellow form or pink MOST form) Yellow form placed in chart (order not valid for inpatient use)     Chief Complaint  Patient presents with  . Medical Management of Chronic Issues    Routine visit of medical management  Medical management of chronic medical conditions including dementia-type 2 diabetes-coronary artery disease-hypertension  HPI:  Pt is a 79 y.o. female seen today for medical management of chronic diseases.  As noted above.  Patient appears to have enjoyed an extended period of stability.  She does have a history of significant dementia but nursing has not really noted any recent behaviors of great concern.   weight appears to be stable at around 117 pounds .  She does have a history of diabetes and is on Glucophage 500 mg twice daily-blood sugars appear to be pretty stable largely in the 100s range  occasionally spike in the 200s but this is fairly rare.  Hemoglobin A1c was 7.9 back in March we will update this.  Earlier this year she was hospitalized with salmonella enteritis with acute kidney failure but this resolved with antibiotics and fluid supplementation.  She also had emesis that was coffee-ground there were no further episodes she was started on Prilosec and there has been no further issues in this regard.  Hemoglobin has been stable it was 12.1 back in March we will update this.  She also tested positive for Covid pneumonia earlier this year was treated with IV fluids and Decadron and antibiotics she appeared to do well and appears to be essentially back at her baseline.  She also has a minimally elevated TSH of 4.24 we will monitor this periodically.  Currently she is lying in bed comfortably remains confused but is pleasant and does follow simple verbal commands she is not agitated with exam         Past Medical History:  Diagnosis Date  . Allergy to ACE inhibitors 03/17/2015  . Alzheimer disease ([facility])   . Anxiety   . CAD (coronary artery disease)    a. s/p inferior STEMI 12/29/10 with rotablator atherectomy RCA 12/31/10 and DES x 2 RCA;  b. Lexiscan Myoview (02/2015): mild reversible apical anterior perfusion defect. C. cath 02/2015 50% LAD lesion with patent stent, Tx Rx    . Cardiomyopathy, ischemic 03/17/2015  . Chronic diastolic CHF (congestive heart failure) ([facility]) 11/12/2016  . COPD (chronic obstructive pulmonary disease) ([facility])   . Coronary atherosclerosis of native coronary artery 04/03/2013  .  Dementia ([place])   . Dementia ([facility])   . Depression 03/14/2017  . Diverticulosis   . DM2 (diabetes mellitus, type 2) ([facility]) 04/02/2013  . Dysphagia 05/10/2017   Minimal 6/20 - pureed diet  . Fibromyalgia 04/20/2008   Qualifier: Diagnosis of  By: Nelson-Smith CMA (AAMA), Dottie    . GERD (gastroesophageal reflux disease)   . Glaucoma   . HTN (hypertension)   .  Hyperlipidemia   . Ischemic cardiomyopathy    EF 45%cath 2012 EF55% 5/12; 35% 11/14  . Memory deficit   . Nephrolithiasis   . Obesity (BMI 30-39.9) 10/06/2015  . Osteoarthritis   . Overweight(278.02)   . Respiratory arrest//ACE Inhibitor presumed cause   . Senile dementia without behavioral disturbance ([facility]) 10/06/2015  . Sleep apnea   . Small bowel obstruction ([facility])    from a sigmoid stricture  . Ventricular tachycardia ([facility]) 03/17/2015  . Vitamin D deficiency 01/28/2017   Past Surgical History:  Procedure Laterality Date  . bilateral tubal ligation    . CARDIAC CATHETERIZATION N/A 03/18/2015   Procedure: LEFT HEART CATH AND CORONARY ANGIOGRAPHY;  Surgeon: Lorretta Harp, MD;  Location: [facility] CATH LAB;  Service: Cardiovascular;  Laterality: N/A;  . CARDIAC SURGERY    . HIP ARTHROPLASTY Right 11/10/2016   Procedure: ARTHROPLASTY BIPOLAR HIP (HEMIARTHROPLASTY);  Surgeon: Marchia Bond, MD;  Location: [facility];  Service: Orthopedics;  Laterality: Right;  . 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 LITHOTRIPSY      Allergies  Allergen Reactions  . Lisinopril Other (See Comments)    Reaction:  Respiratory arrest   . Spironolactone Anaphylaxis  . Vancomycin Anaphylaxis and Rash  . Donepezil Diarrhea  . Eggs Or Egg-Derived Products Diarrhea  . Erythromycin Other (See Comments)    Reaction:  Makes pt feel weird   . Macrolides And Ketolides Other (See Comments)    Reaction:  Unknown   . Penicillins Itching and Other (See Comments)    Has patient had a PCN reaction causing immediate rash, facial/tongue/throat swelling, SOB or lightheadedness with hypotension: No Has patient had a PCN reaction causing severe rash involving mucus membranes or skin necrosis: No Has patient had a PCN reaction that required hospitalization No Has patient had a PCN reaction occurring within the last 10 years: No If all of the above answers are "NO", then may proceed with  Cephalosporin use.  . Quinapril Hcl Itching  . Angiotensin Receptor Blockers Other (See Comments)    Reaction:  Unknown   . Lipitor [Atorvastatin] Palpitations  . Pentazocine Lactate Other (See Comments)    Reaction:  Unknown   . Propoxyphene Hcl Other (See Comments)    Reaction unknown  . Sulfonamide Derivatives Other (See Comments)    Reaction:  Unknown     Allergies as of 04/07/2020      Reactions   Lisinopril Other (See Comments)   Reaction:  Respiratory arrest    Spironolactone Anaphylaxis   Vancomycin Anaphylaxis, Rash   Donepezil Diarrhea   Eggs Or Egg-derived Products Diarrhea   Erythromycin Other (See Comments)   Reaction:  Makes pt feel weird    Macrolides And Ketolides Other (See Comments)   Reaction:  Unknown    Penicillins Itching, Other (See Comments)   Has patient had a PCN reaction causing immediate rash, facial/tongue/throat swelling, SOB or lightheadedness with hypotension: No Has patient had a PCN reaction causing severe rash involving mucus membranes or skin necrosis: No Has patient had a PCN reaction  that required hospitalization No Has patient had a PCN reaction occurring within the last 10 years: No If all of the above answers are "NO", then may proceed with Cephalosporin use.   Quinapril Hcl Itching   Angiotensin Receptor Blockers Other (See Comments)   Reaction:  Unknown    Lipitor [atorvastatin] Palpitations   Pentazocine Lactate Other (See Comments)   Reaction:  Unknown    Propoxyphene Hcl Other (See Comments)   Reaction unknown   Sulfonamide Derivatives Other (See Comments)   Reaction:  Unknown       Medication List       Accurate as of Apr 07, 2020  4:40 PM. If you have any questions, ask your nurse or doctor.        amLODipine 5 MG tablet Commonly known as: NORVASC Take 1 tablet (5 mg total) by mouth daily.   aspirin EC 81 MG tablet Take 81 mg by mouth daily.   atorvastatin 40 MG tablet Commonly known as: LIPITOR Take 40 mg by  mouth daily.   bisacodyl 10 MG suppository Commonly known as: DULCOLAX If not relieved by MOM, give 10 mg Bisacodyl suppositiory rectally X 1 dose in 24 hours as needed (Do not use constipation standing orders for residents with renal failure/CFR less than 30. Contact MD for orders) (Physician Order)   clopidogrel 75 MG tablet Commonly known as: PLAVIX Take 75 mg by mouth daily.   feeding supplement Liqd Commonly known as: BOOST / RESOURCE BREEZE Take 1 Container by mouth 3 (three) times daily.   furosemide 20 MG tablet Commonly known as: LASIX Take 20 mg by mouth daily.   isosorbide mononitrate 30 MG 24 hr tablet Commonly known as: IMDUR Take 0.5 tablets (15 mg total) by mouth daily.   magnesium hydroxide 400 MG/5ML suspension Commonly known as: MILK OF MAGNESIA If no BM in 3 days, give 30 cc Milk of Magnesium p.o. x 1 dose in 24 hours as needed (Do not use standing constipation orders for residents with renal failure CFR less than 30. Contact MD for orders) (Physician Order)   magnesium oxide 400 MG tablet Commonly known as: MAG-OX Take 400 mg by mouth daily.   memantine 10 MG tablet Commonly known as: NAMENDA Take 10 mg by mouth 2 (two) times daily.   metFORMIN 500 MG tablet Commonly known as: GLUCOPHAGE Take 500 mg by mouth 2 (two) times daily with a meal.   metoprolol tartrate 25 MG tablet Commonly known as: LOPRESSOR Take 12.5 mg by mouth every morning.   mometasone-formoterol 100-5 MCG/ACT Aero Commonly known as: DULERA Inhale 2 puffs into the lungs 2 (two) times daily.   NON FORMULARY DIET: Regular   NUTRITIONAL SUPPLEMENTS PO Take 1 each by mouth See admin instructions. Magic Cup with lunch and dinner meal   Omeprazole 20 MG Tbec Take 20 mg by mouth 2 (two) times daily.   RA SALINE ENEMA RE If not relieved by Biscodyl suppository, give disposable Saline Enema rectally X 1 dose/24 hrs as needed (Do not use constipation standing orders for residents with  renal failure/CFR less than 30. Contact MD for orders)(Physician Or   sennosides-docusate sodium 8.6-50 MG tablet Commonly known as: SENOKOT-S Take 2 tablets by mouth daily.   traMADol 50 MG tablet Commonly known as: ULTRAM Take 1 tablet (50 mg total) by mouth daily.   Vitamin D3 1.25 MG (50000 UT) Caps Take 1 capsule by mouth once a week.       Review of Systems  Is unobtainable secondary to dementia please see HPI  Immunization History  Administered Date(s) Administered  . Influenza Split 08/20/2014  . Influenza, High Dose Seasonal PF 10/06/2013  . Influenza-Unspecified 10/17/2017, 08/29/2018, 08/27/2019  . Moderna SARS-COVID-2 Vaccination 11/20/2019, 12/18/2019  . PPD Test 11/16/2016, 12/05/2016  . Pneumococcal Conjugate-13 10/17/2017  . Pneumococcal Polysaccharide-23 09/24/2009  . Tdap 10/18/2017   Pertinent  Health Maintenance Due  Topic Date Due  . URINE MICROALBUMIN  04/30/2020  . OPHTHALMOLOGY EXAM  09/08/2020 (Originally 11/03/1951)  . INFLUENZA VACCINE  06/20/2020  . HEMOGLOBIN A1C  07/30/2020  . FOOT EXAM  01/26/2021  . DEXA SCAN  Completed  . PNA vac Low Risk Adult  Completed   Fall Risk  04/19/2018  Falls in the past year? No   Functional Status Survey:    Vitals:   04/07/20 1637  BP: (!) 154/70  Pulse: 85  Resp: 17  Temp: 97.8 F (36.6 C)  TempSrc: Oral  SpO2: 97%  Weight: 116 lb 14.4 oz (53 kg)  Height: 5\' 4"  (1.626 m)   Body mass index is 20.07 kg/m. Physical Exam General this is a pleasant elderly female in no distress lying comfortably in bed.  Her skin is warm and dry.  Eyes visual acuity remains intact cannot appreciate any drainage erythema-sclera and conjunctive are clear.  Oropharynx clear mucous membranes moist.  Chest is clear to auscultation without labored breathing.  Heart is regular rate and rhythm without murmur gallop or rub she does not really have edema.  Abdomen soft it is nontender has active bowel  sounds.  Musculoskeletal-appears able to move all her extremities x4 at baseline limited exam since she is in bed.  Neurologic remains grossly intact without lateralizing findings her speech is clear she does have some confusion but this is not new.  Psych she is oriented to self does follow some simple verbal commands at times she is not agitated with exam.   Labs reviewed: Recent Labs    10/14/19 0528 10/15/19 0601 10/19/19 0500 10/19/19 0500 10/20/19 0500 10/20/19 0500 10/21/19 0521 10/21/19 0521 10/27/19 0000 12/23/19 0000 01/28/20 0000  NA 146*   < > 142   < > 141   < > 143  --  140 139 141  K 4.4   < > 3.5   < > 4.1   < > 3.7   < > 4.7 4.8 4.6  CL 106   < > 106   < > 104   < > 110   < > 102 97* 104  CO2 15*   < > 28   < > 24   < > 19*   < > 29* 25* 26*  GLUCOSE 128*   < > 148*  --  126*  --  166*  --   --   --   --   BUN 104*   < > 9   < > 10   < > 6*  --  15 26* 32*  CREATININE 6.57*   < > 0.78   < > 0.74   < > 0.75  --  0.6 1.1 1.0  CALCIUM 8.5*   < > 7.5*   < > 7.8*   < > 8.4*   < > 9.2 9.6 9.5  MG 1.7   < > 2.0  --  1.6*  --  1.4*  --   --   --   --   PHOS 7.5*  --   --   --   --   --   --   --   --   --   --    < > =  values in this interval not displayed.   Recent Labs    10/13/19 2117 10/14/19 0528  AST 29  --   ALT 16  --   ALKPHOS 105  --   BILITOT 0.6  --   PROT 9.1*  --   ALBUMIN 3.5 2.6*   Recent Labs    07/01/19 0000 10/19/19 0500 10/19/19 0500 10/20/19 0500 10/20/19 0500 10/21/19 0521 10/27/19 0000 12/23/19 0000 01/28/20 0000  WBC   < > 11.4*   < > 8.7   < > 6.6 8.0 8.0 9.7  NEUTROABS   < >  --   --   --   --   --  5 5 6   HGB   < > 10.3*   < > 10.0*   < > 10.4* 11.7* 12.7 12.1  HCT   < > 31.9*   < > 31.1*   < > 32.9* 36 37 37  MCV  --  98.5  --  99.0  --  99.7  --   --   --   PLT   < > 154   < > 174   < > 202 329 217 227   < > = values in this interval not displayed.   Lab Results  Component Value Date   TSH 4.24 01/28/2020   Lab  Results  Component Value Date   HGBA1C 7.9 01/28/2020   Lab Results  Component Value Date   CHOL 157 01/28/2020   HDL 30 (A) 01/28/2020   LDLCALC 68 01/28/2020   TRIG 296 (A) 01/28/2020   CHOLHDL 2 12/19/2012    Significant Diagnostic Results in last 30 days:  No results found.  Assessment/Plan  #1 history of hypertension systolic is mildly elevated today in the 150s but this does not appear to be consistent blood pressures appear to run systolically recently from 112 up to the 1 4150 range but I do not see any consistent elevations above 140.  She continues on Lopressor 12.5 mg a day as well as Norvasc 5 mg a day and Lasix 20 mg a day she also is on Imdur extended release 15 mg a day.  2.  History of type 2 diabetes she is on Glucophage 500 mg twice daily as noted above CBGs appear to be more in the 100s with occasional spike above 200 but this is not real,-recent hemoglobin A1c was 7.9 will update a hemoglobin A1c again considering her relatively advanced age and risk for hypoglycemia have been somewhat conservative.  3.-History of dementia at this point continue supportive care her weight appears to be relatively stable she is on supplementation nursing does not report any increase in behaviors or agitation.  She is on Namenda 10 mg twice daily.  4.  History of coronary artery disease appears stable without complaints of chest pain or concerning edema she is on Lopressor 12.5 mg a day-Plavix 75 mg a day Imdur 50 mg a day as well as Lipitor 40 mg a day.  Recent LDL was 68 on March lab.  5.  History of hyperlipidemia continues on Lipitor again with recent HDL of 68.  6.  History of COPD appears to be stable on the [facility] which she receives twice a day.  7.  History of coffee-ground emesis in the past this apparently was a single episode when she was in the hospital earlier this year-she is on a proton pump inhibitor-hemoglobin has shown stability will update this.  8.  History  of  low magnesium she is on supplementation last magnesium level was 2.0 will have this updated  .  #9 history of minimally elevated TSH of 4.24 will have this updated as well    [id number]

## 2020-04-08 LAB — CBC AND DIFFERENTIAL
HCT: 40 (ref 36–46)
Hemoglobin: 12.7 (ref 12.0–16.0)
Platelets: 222 (ref 150–399)
WBC: 10.5

## 2020-04-08 LAB — BASIC METABOLIC PANEL
BUN: 26 — AB (ref 4–21)
Creatinine: 0.7 (ref 0.5–1.1)
Glucose: 121
Sodium: 145 (ref 137–147)

## 2020-04-08 LAB — CBC: RBC: 4.07 (ref 3.87–5.11)

## 2020-04-08 LAB — COMPREHENSIVE METABOLIC PANEL: Calcium: 9.9 (ref 8.7–10.7)

## 2020-04-08 LAB — TSH: TSH: 4.36 (ref 0.41–5.90)

## 2020-04-08 LAB — HEMOGLOBIN A1C: Hemoglobin A1C: 7.4

## 2020-04-10 ENCOUNTER — Encounter: Payer: Self-pay | Admitting: Internal Medicine

## 2020-05-13 ENCOUNTER — Non-Acute Institutional Stay (SKILLED_NURSING_FACILITY): Payer: Medicare Other | Admitting: Internal Medicine

## 2020-05-13 ENCOUNTER — Encounter: Payer: Self-pay | Admitting: Internal Medicine

## 2020-05-13 DIAGNOSIS — J449 Chronic obstructive pulmonary disease, unspecified: Secondary | ICD-10-CM

## 2020-05-13 DIAGNOSIS — I25118 Atherosclerotic heart disease of native coronary artery with other forms of angina pectoris: Secondary | ICD-10-CM | POA: Diagnosis not present

## 2020-05-13 DIAGNOSIS — I10 Essential (primary) hypertension: Secondary | ICD-10-CM

## 2020-05-13 DIAGNOSIS — E1159 Type 2 diabetes mellitus with other circulatory complications: Secondary | ICD-10-CM

## 2020-05-13 DIAGNOSIS — F0390 Unspecified dementia without behavioral disturbance: Secondary | ICD-10-CM | POA: Diagnosis not present

## 2020-05-13 DIAGNOSIS — Z794 Long term (current) use of insulin: Secondary | ICD-10-CM

## 2020-05-13 NOTE — Progress Notes (Signed)
Location:    [facility] Room Number: 290/S Place of Service:  SNF [phone]) Provider:  Leda Min, MD  Patient Care Team: Hennie Duos, MD as PCP - General (Internal Medicine)  Extended Emergency Contact Information Primary Emergency Contact: [name],[name] Address: [address]          [place], [place] [address] [name] of [place] Phone: [phone] Work Phone: [phone] Mobile Phone: [phone] Relation: Daughter Secondary Emergency Contact: [name],[name]  [place] Phone: [phone] Relation: Niece  Code Status:  DNR Goals of care: Advanced Directive information Advanced Directives 05/13/2020  Does Patient Have a Medical Advance Directive? Yes  Type of Advance Directive Out of facility DNR (pink MOST or yellow form)  Does patient want to make changes to medical advance directive? No - Patient declined  Copy of [place] in Chart? -  Would patient like information on creating a medical advance directive? -  Pre-existing out of facility DNR order (yellow form or pink MOST form) Yellow form placed in chart (order not valid for inpatient use)     Chief Complaint  Patient presents with  . Medical Management of Chronic Issues    Routine visit of medical management  For medical management of chronic medical conditions including dementia-type 2 diabetes as well as coronary artery disease and hypertension  HPI:  Pt is a 79 y.o. female seen today for medical management of chronic diseases. As noted above.  She continues to be quite stable nursing does not really report any issues.  She does have a history of dementia which appears quite significant but nursing has not noted any behaviors of great concern-her weight appears to be relatively stable.   She is a type II diabetic she is on Glucophage 500 mg twice daily-hemoglobin A1c was 7.4 on May lab which shows some  improvement.  Blood sugars appear to be in the mid 100s to lower 200s range from the readings I was able to assess.  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  She also had coffee-ground emesis there were no further episodes-she does continue on Prilosec 20 mg twice daily.  Hemoglobin has shown stability it was 12.7 on May lab.  She also tested positive for Covid pneumonia earlier this year that was treated with antibiotics IV fluids and Decadron-she appeared to have done well with this and essentially returned to her baseline  She also has somewhat of a minimally elevated TSH it was 4.36 back in May which is relatively stable.  She also is on magnesium supplementation and magnesium level was 1.8 on lab done last month.  Currently she appears to be comfortable she does not have any complaints-again she is a poor historian secondary to dementia   Past Medical History:  Diagnosis Date  . Allergy to ACE inhibitors 03/17/2015  . Alzheimer disease ([facility])   . Anxiety   . CAD (coronary artery disease)    a. s/p inferior STEMI 12/29/10 with rotablator atherectomy RCA 12/31/10 and DES x 2 RCA;  b. Lexiscan Myoview (02/2015): mild reversible apical anterior perfusion defect. C. cath 02/2015 50% LAD lesion with patent stent, Tx Rx    . Cardiomyopathy, ischemic 03/17/2015  . Chronic diastolic CHF (congestive heart failure) ([facility]) 11/12/2016  . COPD (chronic obstructive pulmonary disease) ([facility])   . Coronary atherosclerosis of native coronary artery 04/03/2013  .  Dementia ([place])   . Dementia ([facility])   . Depression 03/14/2017  . Diverticulosis   . DM2 (diabetes mellitus, type 2) ([facility]) 04/02/2013  . Dysphagia 05/10/2017   Minimal 6/20 - pureed diet  . Fibromyalgia 04/20/2008   Qualifier: Diagnosis of  By: Nelson-Smith CMA (AAMA), Dottie    . GERD (gastroesophageal reflux disease)   . Glaucoma   . HTN  (hypertension)   . Hyperlipidemia   . Ischemic cardiomyopathy    EF 45%cath 2012 EF55% 5/12; 35% 11/14  . Memory deficit   . Nephrolithiasis   . Obesity (BMI 30-39.9) 10/06/2015  . Osteoarthritis   . Overweight(278.02)   . Respiratory arrest//ACE Inhibitor presumed cause   . Senile dementia without behavioral disturbance ([facility]) 10/06/2015  . Sleep apnea   . Small bowel obstruction ([facility])    from a sigmoid stricture  . Ventricular tachycardia ([facility]) 03/17/2015  . Vitamin D deficiency 01/28/2017   Past Surgical History:  Procedure Laterality Date  . bilateral tubal ligation    . CARDIAC CATHETERIZATION N/A 03/18/2015   Procedure: LEFT HEART CATH AND CORONARY ANGIOGRAPHY;  Surgeon: Lorretta Harp, MD;  Location: [facility] CATH LAB;  Service: Cardiovascular;  Laterality: N/A;  . CARDIAC SURGERY    . HIP ARTHROPLASTY Right 11/10/2016   Procedure: ARTHROPLASTY BIPOLAR HIP (HEMIARTHROPLASTY);  Surgeon: Marchia Bond, MD;  Location: [facility];  Service: Orthopedics;  Laterality: Right;  . 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 LITHOTRIPSY      Allergies  Allergen Reactions  . Lisinopril Other (See Comments)    Reaction:  Respiratory arrest   . Spironolactone Anaphylaxis  . Vancomycin Anaphylaxis and Rash  . Donepezil Diarrhea  . Eggs Or Egg-Derived Products Diarrhea  . Erythromycin Other (See Comments)    Reaction:  Makes pt feel weird   . Macrolides And Ketolides Other (See Comments)    Reaction:  Unknown   . Penicillins Itching and Other (See Comments)    Has patient had a PCN reaction causing immediate rash, facial/tongue/throat swelling, SOB or lightheadedness with hypotension: No Has patient had a PCN reaction causing severe rash involving mucus membranes or skin necrosis: No Has patient had a PCN reaction that required hospitalization No Has patient had a PCN reaction occurring within the last 10 years: No If all of the above answers are "NO", then may  proceed with Cephalosporin use.  . Quinapril Hcl Itching  . Angiotensin Receptor Blockers Other (See Comments)    Reaction:  Unknown   . Lipitor [Atorvastatin] Palpitations  . Pentazocine Lactate Other (See Comments)    Reaction:  Unknown   . Propoxyphene Hcl Other (See Comments)    Reaction unknown  . Sulfonamide Derivatives Other (See Comments)    Reaction:  Unknown     Allergies as of 05/13/2020      Reactions   Lisinopril Other (See Comments)   Reaction:  Respiratory arrest    Spironolactone Anaphylaxis   Vancomycin Anaphylaxis, Rash   Donepezil Diarrhea   Eggs Or Egg-derived Products Diarrhea   Erythromycin Other (See Comments)   Reaction:  Makes pt feel weird    Macrolides And Ketolides Other (See Comments)   Reaction:  Unknown    Penicillins Itching, Other (See Comments)   Has patient had a PCN reaction causing immediate rash, facial/tongue/throat swelling, SOB or lightheadedness with hypotension: No Has patient had a PCN reaction causing severe rash involving mucus membranes or skin necrosis: No Has patient had a PCN reaction  that required hospitalization No Has patient had a PCN reaction occurring within the last 10 years: No If all of the above answers are "NO", then may proceed with Cephalosporin use.   Quinapril Hcl Itching   Angiotensin Receptor Blockers Other (See Comments)   Reaction:  Unknown    Lipitor [atorvastatin] Palpitations   Pentazocine Lactate Other (See Comments)   Reaction:  Unknown    Propoxyphene Hcl Other (See Comments)   Reaction unknown   Sulfonamide Derivatives Other (See Comments)   Reaction:  Unknown       Medication List       Accurate as of May 13, 2020  4:23 PM. If you have any questions, ask your nurse or doctor.        amLODipine 5 MG tablet Commonly known as: NORVASC Take 1 tablet (5 mg total) by mouth daily.   aspirin EC 81 MG tablet Take 81 mg by mouth daily.   atorvastatin 40 MG tablet Commonly known as:  LIPITOR Take 40 mg by mouth daily.   bisacodyl 10 MG suppository Commonly known as: DULCOLAX If not relieved by MOM, give 10 mg Bisacodyl suppositiory rectally X 1 dose in 24 hours as needed (Do not use constipation standing orders for residents with renal failure/CFR less than 30. Contact MD for orders) (Physician Order)   clopidogrel 75 MG tablet Commonly known as: PLAVIX Take 75 mg by mouth daily.   feeding supplement Liqd Commonly known as: BOOST / RESOURCE BREEZE Take 1 Container by mouth 3 (three) times daily.   furosemide 20 MG tablet Commonly known as: LASIX Take 20 mg by mouth daily.   isosorbide mononitrate 30 MG 24 hr tablet Commonly known as: IMDUR Take 0.5 tablets (15 mg total) by mouth daily.   magnesium hydroxide 400 MG/5ML suspension Commonly known as: MILK OF MAGNESIA If no BM in 3 days, give 30 cc Milk of Magnesium p.o. x 1 dose in 24 hours as needed (Do not use standing constipation orders for residents with renal failure CFR less than 30. Contact MD for orders) (Physician Order)   magnesium oxide 400 MG tablet Commonly known as: MAG-OX Take 400 mg by mouth daily.   memantine 10 MG tablet Commonly known as: NAMENDA Take 10 mg by mouth 2 (two) times daily.   metFORMIN 500 MG tablet Commonly known as: GLUCOPHAGE Take 500 mg by mouth 2 (two) times daily with a meal.   metoprolol tartrate 25 MG tablet Commonly known as: LOPRESSOR Take 12.5 mg by mouth every morning.   mometasone-formoterol 100-5 MCG/ACT Aero Commonly known as: DULERA Inhale 2 puffs into the lungs 2 (two) times daily.   NON FORMULARY DIET: Regular   NUTRITIONAL SUPPLEMENTS PO Take 1 each by mouth See admin instructions. Magic Cup with lunch and dinner meal   Omeprazole 20 MG Tbec Take 20 mg by mouth 2 (two) times daily.   RA SALINE ENEMA RE If not relieved by Biscodyl suppository, give disposable Saline Enema rectally X 1 dose/24 hrs as needed (Do not use constipation standing  orders for residents with renal failure/CFR less than 30. Contact MD for orders)(Physician Or   sennosides-docusate sodium 8.6-50 MG tablet Commonly known as: SENOKOT-S Take 2 tablets by mouth daily.   traMADol 50 MG tablet Commonly known as: ULTRAM Take 1 tablet (50 mg total) by mouth daily.   Vitamin D3 1.25 MG (50000 UT) Caps Take 1 capsule by mouth once a week.       Review of Systems  Is unobtainable secondary to dementia please see HPI  Immunization History  Administered Date(s) Administered  . Influenza Split 08/20/2014  . Influenza, High Dose Seasonal PF 10/06/2013  . Influenza-Unspecified 10/17/2017, 08/29/2018, 08/27/2019  . Moderna SARS-COVID-2 Vaccination 11/20/2019, 12/18/2019  . PPD Test 11/16/2016, 12/05/2016  . Pneumococcal Conjugate-13 10/17/2017  . Pneumococcal Polysaccharide-23 09/24/2009  . Tdap 10/18/2017   Pertinent  Health Maintenance Due  Topic Date Due  . URINE MICROALBUMIN  04/30/2020  . OPHTHALMOLOGY EXAM  09/08/2020 (Originally 11/03/1951)  . INFLUENZA VACCINE  06/20/2020  . HEMOGLOBIN A1C  07/30/2020  . FOOT EXAM  01/26/2021  . DEXA SCAN  Completed  . PNA vac Low Risk Adult  Completed   Fall Risk  04/19/2018  Falls in the past year? No   Functional Status Survey:    Vitals:   05/13/20 1621  BP: (!) 141/73  Pulse: 64  Resp: 20  Temp: (!) 97 F (36.1 C)  TempSrc: Oral  SpO2: 97%  Weight: 114 lb 12.8 oz (52.1 kg)  Height: 5\' 4"  (1.626 m)   Body mass index is 19.71 kg/m. Physical Exam   In general this is a pleasant elderly female in no distress she is confused but pleasantly self.  Her skin is warm and dry.  Eyes visual acuity appears to be intact sclera and conjunctive are clear.  Oropharynx is clear mucous membranes appear moist.  Chest is clear to auscultation she does have some mild bronchial sounds but cannot really appreciate overt congestion or any labored breathing.  Heart distant heart sounds regular rate and  rhythm from what I could auscultate and per radial pulse she does not have significant lower extremity edema.  Abdomen is soft nontender with positive bowel sounds.  Musculoskeletal is able to move all extremities x4 it appears at relative baseline.  Neurologic appears grossly intact cannot appreciate lateralizing findings-her speech is clear although she does have confusion.  Psych she is oriented to self will follow simple verbal commands with fairly extensive prompting.    Labs reviewed:  Apr 08, 2020.  WBC 10.5 hemoglobin 12.7 platelets 222.  Sodium 145 potassium 4.2 BUN 25.7 creatinine 0.65.  TSH was 4.26.  Hemoglobin A1c was 7.2.  Magnesium level was 1.8.   Recent Labs    10/14/19 0528 10/15/19 0601 10/19/19 0500 10/19/19 0500 10/20/19 0500 10/20/19 0500 10/21/19 0521 10/21/19 0521 10/27/19 0000 12/23/19 0000 01/28/20 0000  NA 146*   < > 142   < > 141   < > 143  --  140 139 141  K 4.4   < > 3.5   < > 4.1   < > 3.7   < > 4.7 4.8 4.6  CL 106   < > 106   < > 104   < > 110   < > 102 97* 104  CO2 15*   < > 28   < > 24   < > 19*   < > 29* 25* 26*  GLUCOSE 128*   < > 148*  --  126*  --  166*  --   --   --   --   BUN 104*   < > 9   < > 10   < > 6*  --  15 26* 32*  CREATININE 6.57*   < > 0.78   < > 0.74   < > 0.75  --  0.6 1.1 1.0  CALCIUM 8.5*   < > 7.5*   < >  7.8*   < > 8.4*   < > 9.2 9.6 9.5  MG 1.7   < > 2.0  --  1.6*  --  1.4*  --   --   --   --   PHOS 7.5*  --   --   --   --   --   --   --   --   --   --    < > = values in this interval not displayed.   Recent Labs    10/13/19 2117 10/14/19 0528  AST 29  --   ALT 16  --   ALKPHOS 105  --   BILITOT 0.6  --   PROT 9.1*  --   ALBUMIN 3.5 2.6*   Recent Labs    07/01/19 0000 10/19/19 0500 10/19/19 0500 10/20/19 0500 10/20/19 0500 10/21/19 0521 10/27/19 0000 12/23/19 0000 01/28/20 0000  WBC   < > 11.4*   < > 8.7   < > 6.6 8.0 8.0 9.7  NEUTROABS   < >  --   --   --   --   --  5 5 6   HGB   < >  10.3*   < > 10.0*   < > 10.4* 11.7* 12.7 12.1  HCT   < > 31.9*   < > 31.1*   < > 32.9* 36 37 37  MCV  --  98.5  --  99.0  --  99.7  --   --   --   PLT   < > 154   < > 174   < > 202 329 217 227   < > = values in this interval not displayed.   Lab Results  Component Value Date   TSH 4.24 01/28/2020   Lab Results  Component Value Date   HGBA1C 7.9 01/28/2020   Lab Results  Component Value Date   CHOL 157 01/28/2020   HDL 30 (A) 01/28/2020   LDLCALC 68 01/28/2020   TRIG 296 (A) 01/28/2020   CHOLHDL 2 12/19/2012    Significant Diagnostic Results in last 30 days:  No results found.  Assessment/Plan  #1 history of dementia at this point she appears to be doing well with supportive care She continues on Namenda 10 mg twice a day-her weight appears to be relatively stable run 115 pounds at this point continue supportive care  #2-history of hypertension this appears relatively stable-she does have some variability here with systolics ranging from 376 to occasionally slightly over 150- continues on Lopressor 12.5 mg a day-Norvasc 5 mg a day-as well as Lasix 20 mg a day  3. History of type 2 diabetes continues on Glucophage 500 mg twice daily her hemoglobin A1c shows improvement at 7.4 blood sugars appear to hover more in the mid 100s to lower 200s generally.  4. History of coronary artery disease this appears relatively asymptomatic without complaints of shortness of breath or chest pain or increasing edema she is on Lopressor 12.5 mg a day as well as Plavix 75 mg a day aspirin 800 mg a day-- Imdur 15 mg a day-as well as Lipitor 40 mg a day   #5-upper lipidemia as noted above she is on Lipitor 40 mg a day LDL on March lab was 68.  6. History of COPD appears stable on Dulera.BID  7. History of coffee-ground emesis in the past-this apparently was an isolated episode-she is on a proton pump inhibitor twice a day hemoglobin continues to  show stability was 12.7 on lab done last  month.  8. History of low magnesium she is on mag oxide 400 mg a day this appears stable with a recent magnesium level of 1.8 done on lab last month.  9. History of minimally elevated TSH this continues to be pretty stable with TSH is in the low fours.  Will monitor periodically.  [id number]

## 2020-05-16 ENCOUNTER — Encounter: Payer: Self-pay | Admitting: Internal Medicine

## 2020-05-18 LAB — BASIC METABOLIC PANEL
BUN: 23 — AB (ref 4–21)
CO2: 21 (ref 13–22)
Chloride: 101 (ref 99–108)
Creatinine: 0.8 (ref 0.5–1.1)
Glucose: 130
Potassium: 4.6 (ref 3.4–5.3)
Sodium: 139 (ref 137–147)

## 2020-05-18 LAB — COMPREHENSIVE METABOLIC PANEL
Calcium: 10.1 (ref 8.7–10.7)
GFR calc Af Amer: 78.23
GFR calc non Af Amer: 67.5

## 2020-05-25 ENCOUNTER — Other Ambulatory Visit: Payer: Self-pay | Admitting: Internal Medicine

## 2020-05-25 MED ORDER — ISOSORBIDE MONONITRATE 10 MG PO TABS: 10.0000 mg | ORAL_TABLET | Freq: Two times a day (BID) | ORAL | 5 refills | Status: DC

## 2020-06-02 ENCOUNTER — Other Ambulatory Visit: Payer: Self-pay | Admitting: Family

## 2020-06-02 MED ORDER — TRAMADOL HCL 50 MG PO TABS: 50.0000 mg | ORAL_TABLET | Freq: Every day | ORAL | 0 refills | Status: DC

## 2020-06-24 ENCOUNTER — Encounter: Payer: Self-pay | Admitting: Family

## 2020-06-24 ENCOUNTER — Non-Acute Institutional Stay (SKILLED_NURSING_FACILITY): Payer: Medicare Other | Admitting: Family

## 2020-06-24 DIAGNOSIS — I5032 Chronic diastolic (congestive) heart failure: Secondary | ICD-10-CM

## 2020-06-24 DIAGNOSIS — J449 Chronic obstructive pulmonary disease, unspecified: Secondary | ICD-10-CM

## 2020-06-24 DIAGNOSIS — E1159 Type 2 diabetes mellitus with other circulatory complications: Secondary | ICD-10-CM | POA: Diagnosis not present

## 2020-06-24 DIAGNOSIS — I25118 Atherosclerotic heart disease of native coronary artery with other forms of angina pectoris: Secondary | ICD-10-CM

## 2020-06-24 DIAGNOSIS — I10 Essential (primary) hypertension: Secondary | ICD-10-CM | POA: Diagnosis not present

## 2020-06-24 DIAGNOSIS — K219 Gastro-esophageal reflux disease without esophagitis: Secondary | ICD-10-CM

## 2020-06-24 DIAGNOSIS — Z794 Long term (current) use of insulin: Secondary | ICD-10-CM

## 2020-06-24 DIAGNOSIS — E785 Hyperlipidemia, unspecified: Secondary | ICD-10-CM

## 2020-06-24 NOTE — Progress Notes (Signed)
Location:  [facility] Room Number: 202-W Place of Service:  SNF [id number]) Provider: Karli Wickizer FNP-C   Hennie Duos, MD  Patient Care Team: Hennie Duos, MD as PCP - General (Internal Medicine)  Extended Emergency Contact Information Primary Emergency Contact: [name],[name] Address: [address]          [place],  [address] [name] of [place] Phone: [phone] Work Phone: [phone] Mobile Phone: [phone] Relation: Daughter Secondary Emergency Contact: [name],[name]  [place] Phone: [phone] Relation: Niece  Code Status: DNR Goals of care: Advanced Directive information Advanced Directives 06/24/2020  Does Patient Have a Medical Advance Directive? Yes  Type of Advance Directive Out of facility DNR (pink MOST or yellow form)  Does patient want to make changes to medical advance directive? No - Patient declined  Copy of [place] in Chart? -  Would patient like information on creating a medical advance directive? -  Pre-existing out of facility DNR order (yellow form or pink MOST form) Yellow form placed in chart (order not valid for inpatient use)     Chief Complaint  Patient presents with  . Medical Management of Chronic Issues    Routine Visit.  Marland Kitchen Health Maintenance    Dicsuss the need for Urine Microalbumin, and Hepatitis C Screening.  . Immunizations    Discuss the need for Influenza Vaccine.    HPI:  Pt is a 79 y.o. female seen today for medical management of chronic diseases.she has a medical history of Hypertension,Type DM,CAD,chronic diastolic CHF,COPD,Depression,Hyperlipidemia,Osteoarthritis,GERD,Vitamin D deficiency among other conditions.she is seen asleep in the bed but easily awakens.she denies any acute issues today.Facility Nurse reports no acute concerns.Has had no recent fall episodes and weight loss.Her CBG log reviewed readings ranging in the  110's-200's both for fasting and post prandial.Her recent BIMs scored was 3/15 but no behavioral issues reported.     Past Medical History:  Diagnosis Date  . Allergy to ACE inhibitors 03/17/2015  . Alzheimer disease ([place])   . Anxiety   . CAD (coronary artery disease)    a. s/p inferior STEMI 12/29/10 with rotablator atherectomy RCA 12/31/10 and DES x 2 RCA;  b. Lexiscan Myoview (02/2015): mild reversible apical anterior perfusion defect. C. cath 02/2015 50% LAD lesion with patent stent, Tx Rx    . Cardiomyopathy, ischemic 03/17/2015  . Chronic diastolic CHF (congestive heart failure) ([place]) 11/12/2016  . COPD (chronic obstructive pulmonary disease) ([place])   . Coronary atherosclerosis of native coronary artery 04/03/2013  . Dementia ([place])   . Dementia ([place])   . Depression 03/14/2017  . Diverticulosis   . DM2 (diabetes mellitus, type 2) ([place]) 04/02/2013  . Dysphagia 05/10/2017   Minimal 6/20 - pureed diet  . Fibromyalgia 04/20/2008   Qualifier: Diagnosis of  By: Nelson-Smith CMA (AAMA), Dottie    . GERD (gastroesophageal reflux disease)   . Glaucoma   . HTN (hypertension)   . Hyperlipidemia   . Ischemic cardiomyopathy    EF 45%cath 2012 EF55% 5/12; 35% 11/14  . Memory deficit   . Nephrolithiasis   . Obesity (BMI 30-39.9) 10/06/2015  . Osteoarthritis   . Overweight(278.02)   . Respiratory arrest//ACE Inhibitor presumed cause   . Senile dementia without behavioral disturbance ([place]) 10/06/2015  . Sleep apnea   . Small bowel obstruction (HCC)    from a sigmoid stricture  . Ventricular tachycardia ([place]) 03/17/2015  . Vitamin D deficiency 01/28/2017   Past Surgical  History:  Procedure Laterality Date  . bilateral tubal ligation    . CARDIAC CATHETERIZATION N/A 03/18/2015   Procedure: LEFT HEART CATH AND CORONARY ANGIOGRAPHY;  Surgeon: Lorretta Harp, MD;  Location: [facility] CATH LAB;  Service: Cardiovascular;  Laterality: N/A;  . CARDIAC SURGERY    . HIP ARTHROPLASTY Right 11/10/2016    Procedure: ARTHROPLASTY BIPOLAR HIP (HEMIARTHROPLASTY);  Surgeon: Marchia Bond, MD;  Location: [facility];  Service: Orthopedics;  Laterality: Right;  . 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 LITHOTRIPSY      Allergies  Allergen Reactions  . Lisinopril Other (See Comments)    Reaction:  Respiratory arrest   . Spironolactone Anaphylaxis  . Vancomycin Anaphylaxis and Rash  . Donepezil Diarrhea  . Eggs Or Egg-Derived Products Diarrhea  . Erythromycin Other (See Comments)    Reaction:  Makes pt feel weird   . Macrolides And Ketolides Other (See Comments)    Reaction:  Unknown   . Penicillins Itching and Other (See Comments)    Has patient had a PCN reaction causing immediate rash, facial/tongue/throat swelling, SOB or lightheadedness with hypotension: No Has patient had a PCN reaction causing severe rash involving mucus membranes or skin necrosis: No Has patient had a PCN reaction that required hospitalization No Has patient had a PCN reaction occurring within the last 10 years: No If all of the above answers are "NO", then may proceed with Cephalosporin use.  . Quinapril Hcl Itching  . Angiotensin Receptor Blockers Other (See Comments)    Reaction:  Unknown   . Lipitor [Atorvastatin] Palpitations  . Pentazocine Lactate Other (See Comments)    Reaction:  Unknown   . Propoxyphene Hcl Other (See Comments)    Reaction unknown  . Sulfonamide Derivatives Other (See Comments)    Reaction:  Unknown     Allergies as of 06/24/2020      Reactions   Lisinopril Other (See Comments)   Reaction:  Respiratory arrest    Spironolactone Anaphylaxis   Vancomycin Anaphylaxis, Rash   Donepezil Diarrhea   Eggs Or Egg-derived Products Diarrhea   Erythromycin Other (See Comments)   Reaction:  Makes pt feel weird    Macrolides And Ketolides Other (See Comments)   Reaction:  Unknown    Penicillins Itching, Other (See Comments)   Has patient had a PCN reaction causing  immediate rash, facial/tongue/throat swelling, SOB or lightheadedness with hypotension: No Has patient had a PCN reaction causing severe rash involving mucus membranes or skin necrosis: No Has patient had a PCN reaction that required hospitalization No Has patient had a PCN reaction occurring within the last 10 years: No If all of the above answers are "NO", then may proceed with Cephalosporin use.   Quinapril Hcl Itching   Angiotensin Receptor Blockers Other (See Comments)   Reaction:  Unknown    Lipitor [atorvastatin] Palpitations   Pentazocine Lactate Other (See Comments)   Reaction:  Unknown    Propoxyphene Hcl Other (See Comments)   Reaction unknown   Sulfonamide Derivatives Other (See Comments)   Reaction:  Unknown       Medication List       Accurate as of June 24, 2020  2:37 PM. If you have any questions, ask your nurse or doctor.        STOP taking these medications   feeding supplement Liqd Commonly known as: BOOST / RESOURCE BREEZE Stopped by: Sandrea Hughs, NP     TAKE these medications  amLODipine 5 MG tablet Commonly known as: NORVASC Take 1 tablet (5 mg total) by mouth daily.   aspirin EC 81 MG tablet Take 81 mg by mouth daily.   atorvastatin 40 MG tablet Commonly known as: LIPITOR Take 40 mg by mouth daily.   bisacodyl 10 MG suppository Commonly known as: DULCOLAX If not relieved by MOM, give 10 mg Bisacodyl suppositiory rectally X 1 dose in 24 hours as needed (Do not use constipation standing orders for residents with renal failure/CFR less than 30. Contact MD for orders) (Physician Order)   clopidogrel 75 MG tablet Commonly known as: PLAVIX Take 75 mg by mouth daily.   furosemide 20 MG tablet Commonly known as: LASIX Take 20 mg by mouth daily.   isosorbide mononitrate 30 MG 24 hr tablet Commonly known as: IMDUR Take 15 mg by mouth daily. Take 1/2 tablet = 15mg  What changed: Another medication with the same name was removed. Continue  taking this medication, and follow the directions you see here. Changed by: Sandrea Hughs, NP   magnesium hydroxide 400 MG/5ML suspension Commonly known as: MILK OF MAGNESIA If no BM in 3 days, give 30 cc Milk of Magnesium p.o. x 1 dose in 24 hours as needed (Do not use standing constipation orders for residents with renal failure CFR less than 30. Contact MD for orders) (Physician Order)   magnesium oxide 400 MG tablet Commonly known as: MAG-OX Take 400 mg by mouth daily.   memantine 10 MG tablet Commonly known as: NAMENDA Take 10 mg by mouth 2 (two) times daily.   metFORMIN 500 MG tablet Commonly known as: GLUCOPHAGE Take 500 mg by mouth 2 (two) times daily with a meal.   metoprolol tartrate 25 MG tablet Commonly known as: LOPRESSOR Take 12.5 mg by mouth every morning.   mometasone-formoterol 100-5 MCG/ACT Aero Commonly known as: DULERA Inhale 2 puffs into the lungs 2 (two) times daily.   NON FORMULARY DIET: Regular   NUTRITIONAL SUPPLEMENTS PO Take 1 each by mouth See admin instructions. Magic Cup with lunch and dinner meal   Omeprazole 20 MG Tbec Take 20 mg by mouth 2 (two) times daily.   RA SALINE ENEMA RE If not relieved by Biscodyl suppository, give disposable Saline Enema rectally X 1 dose/24 hrs as needed (Do not use constipation standing orders for residents with renal failure/CFR less than 30. Contact MD for orders)(Physician Or   sennosides-docusate sodium 8.6-50 MG tablet Commonly known as: SENOKOT-S Take 2 tablets by mouth daily.   traMADol 50 MG tablet Commonly known as: ULTRAM Take 1 tablet (50 mg total) by mouth daily.   Vitamin D3 1.25 MG (50000 UT) Caps Take 1 capsule by mouth once a week.       Review of Systems  Unable to perform ROS: Dementia  Constitutional: Negative for appetite change, chills, fatigue and fever.  HENT: Negative for congestion, rhinorrhea, sinus pressure, sinus pain, sneezing, sore throat and trouble swallowing.     Eyes: Negative for discharge, redness and itching.  Respiratory: Negative for cough, chest tightness, shortness of breath and wheezing.   Cardiovascular: Negative for chest pain, palpitations and leg swelling.  Gastrointestinal: Negative for abdominal distention, abdominal pain, constipation, diarrhea, nausea and vomiting.  Endocrine: Negative for cold intolerance, heat intolerance, polydipsia, polyphagia and polyuria.  Genitourinary: Negative for urgency.       Incontinent   Musculoskeletal: Positive for gait problem. Negative for joint swelling and myalgias.  Skin: Negative for color change, pallor and rash.  Neurological: Negative for dizziness, speech difficulty, light-headedness, numbness and headaches.  Hematological: Does not bruise/bleed easily.  Psychiatric/Behavioral: Positive for confusion. Negative for agitation, behavioral problems and sleep disturbance. The patient is not nervous/anxious.     Immunization History  Administered Date(s) Administered  . Influenza Split 08/20/2014  . Influenza, High Dose Seasonal PF 10/06/2013  . Influenza-Unspecified 10/17/2017, 08/29/2018, 08/27/2019  . Moderna SARS-COVID-2 Vaccination 11/20/2019, 12/18/2019  . PPD Test 11/16/2016, 12/05/2016  . Pneumococcal Conjugate-13 10/17/2017  . Pneumococcal Polysaccharide-23 09/24/2009  . Tdap 10/18/2017   Pertinent  Health Maintenance Due  Topic Date Due  . URINE MICROALBUMIN  04/30/2020  . INFLUENZA VACCINE  06/20/2020  . OPHTHALMOLOGY EXAM  09/08/2020 (Originally 11/03/1951)  . HEMOGLOBIN A1C  10/09/2020  . FOOT EXAM  01/26/2021  . DEXA SCAN  Completed  . PNA vac Low Risk Adult  Completed   Fall Risk  04/19/2018  Falls in the past year? No    Vitals:   06/24/20 0940  BP: 119/75  Pulse: 84  Resp: 16  Temp: (!) 97.5 F (36.4 C)  SpO2: 97%  Weight: 113 lb 12.8 oz (51.6 kg)  Height: 5\' 4"  (1.626 m)   Body mass index is 19.53 kg/m. Physical Exam Vitals reviewed.  Constitutional:       General: She is not in acute distress.    Appearance: She is normal weight. She is not ill-appearing.  HENT:     Head: Normocephalic.     Nose: Nose normal. No congestion or rhinorrhea.     Mouth/Throat:     Mouth: Mucous membranes are moist.     Pharynx: Oropharynx is clear. No oropharyngeal exudate or posterior oropharyngeal erythema.  Eyes:     General: No scleral icterus.       Right eye: No discharge.        Left eye: No discharge.     Conjunctiva/sclera: Conjunctivae normal.     Pupils: Pupils are equal, round, and reactive to light.  Cardiovascular:     Rate and Rhythm: Normal rate and regular rhythm.     Pulses: Normal pulses.     Heart sounds: Normal heart sounds. No murmur heard.  No friction rub. No gallop.   Pulmonary:     Effort: Pulmonary effort is normal. No respiratory distress.     Breath sounds: Normal breath sounds. No wheezing, rhonchi or rales.  Chest:     Chest wall: No tenderness.  Abdominal:     General: Bowel sounds are normal. There is no distension.     Palpations: Abdomen is soft. There is no mass.     Tenderness: There is no abdominal tenderness. There is no right CVA tenderness, left CVA tenderness, guarding or rebound.  Musculoskeletal:        General: No swelling, tenderness or signs of injury.     Cervical back: Normal range of motion. No rigidity or tenderness.     Right lower leg: No edema.     Comments: Moves x 4 extremities seen in bed.   Lymphadenopathy:     Cervical: No cervical adenopathy.  Skin:    General: Skin is warm and dry.     Coloration: Skin is not pale.     Findings: No bruising, erythema or rash.  Neurological:     Mental Status: She is alert.     Cranial Nerves: No cranial nerve deficit.     Coordination: Coordination normal.     Gait: Gait abnormal.     Comments:  Alert and oriented to self and person   Psychiatric:        Mood and Affect: Mood normal.        Speech: Speech normal.        Behavior: Behavior is  cooperative.        Thought Content: Thought content normal.    Labs reviewed: Recent Labs    10/14/19 0528 10/15/19 0601 10/19/19 0500 10/19/19 0500 10/20/19 0500 10/20/19 0500 10/21/19 0521 10/27/19 0000 12/23/19 0000 12/23/19 0000 01/28/20 0000 04/08/20 0000 05/18/20 0000  NA 146*   < > 142   < > 141   < > 143   < > 139   < > 141 145 139  K 4.4   < > 3.5   < > 4.1   < > 3.7   < > 4.8  --  4.6  --  4.6  CL 106   < > 106   < > 104   < > 110   < > 97*  --  104  --  101  CO2 15*   < > 28   < > 24   < > 19*   < > 25*  --  26*  --  21  GLUCOSE 128*   < > 148*  --  126*  --  166*  --   --   --   --   --   --   BUN 104*   < > 9   < > 10   < > 6*   < > 26*   < > 32* 26* 23*  CREATININE 6.57*   < > 0.78   < > 0.74   < > 0.75   < > 1.1   < > 1.0 0.7 0.8  CALCIUM 8.5*   < > 7.5*   < > 7.8*   < > 8.4*   < > 9.6   < > 9.5 9.9 10.1  MG 1.7   < > 2.0  --  1.6*  --  1.4*  --   --   --   --   --   --   PHOS 7.5*  --   --   --   --   --   --   --   --   --   --   --   --    < > = values in this interval not displayed.   Recent Labs    10/13/19 2117 10/14/19 0528  AST 29  --   ALT 16  --   ALKPHOS 105  --   BILITOT 0.6  --   PROT 9.1*  --   ALBUMIN 3.5 2.6*   Recent Labs    07/01/19 0000 10/19/19 0500 10/19/19 0500 10/20/19 0500 10/20/19 0500 10/21/19 0521 10/27/19 0000 10/27/19 0000 12/23/19 0000 01/28/20 0000 04/08/20 0000  WBC   < > 11.4*   < > 8.7   < > 6.6 8.0   < > 8.0 9.7 10.5  NEUTROABS   < >  --   --   --   --   --  5  --  5 6  --   HGB   < > 10.3*   < > 10.0*   < > 10.4* 11.7*   < > 12.7 12.1 12.7  HCT   < > 31.9*   < > 31.1*   < > 32.9* 36   < > 37  37 40  MCV  --  98.5  --  99.0  --  99.7  --   --   --   --   --   PLT   < > 154   < > 174   < > 202 329   < > 217 227 222   < > = values in this interval not displayed.   Lab Results  Component Value Date   TSH 4.36 04/08/2020   Lab Results  Component Value Date   HGBA1C 7.4 04/08/2020   Lab Results    Component Value Date   CHOL 157 01/28/2020   HDL 30 (A) 01/28/2020   LDLCALC 68 01/28/2020   TRIG 296 (A) 01/28/2020   CHOLHDL 2 12/19/2012    Significant Diagnostic Results in last 30 days:  No results found.  Assessment/Plan 1. Type 2 diabetes mellitus with other circulatory complication, with long-term current use of insulin (HCC) Lab Results  Component Value Date   HGBA1C 7.4 04/08/2020  CBG stable in the 110's-200's - continue on Metformin 500 mg tablet twice daily. On ASA,BB and statin   2. Essential hypertension B/p at goal. Continue on metoprolol.imdur and amlodipine. Continue on ASA,Statin   3. Atherosclerosis of native coronary artery of native heart with stable angina pectoris ([place]) No chest pain reported.continue on ASA,plavix,Artrovastatin,metoprolol.imdur and amlodipine.  4. Chronic obstructive pulmonary disease, unspecified COPD type ([place]) Breathing stable.continue on Dulera   5. Hyperlipidemia, unspecified hyperlipidemia type LDL at goal.continue on Atorvastatin 40 mg tablet daily.  6. Chronic diastolic CHF (congestive heart failure) (HCC) No signs of fluid overload.continue on Furosemide 20 mg tablet daily.  7. Gastroesophageal reflux disease without esophagitis Stable.continue omeprazole 20 mg twice daily.  Family/ staff Communication: Reviewed plan of care with patient and Facility Nurse   Labs/tests ordered: None   Next Appointment :  One month for medical management of chronic issues. Sandrea Hughs, NP

## 2020-07-01 ENCOUNTER — Other Ambulatory Visit: Payer: Self-pay | Admitting: Family

## 2020-07-01 MED ORDER — TRAMADOL HCL 50 MG PO TABS: 50.0000 mg | ORAL_TABLET | Freq: Every day | ORAL | 0 refills | Status: DC

## 2020-08-02 ENCOUNTER — Other Ambulatory Visit: Payer: Self-pay | Admitting: Family

## 2020-08-02 MED ORDER — TRAMADOL HCL 50 MG PO TABS: 50.0000 mg | ORAL_TABLET | Freq: Every day | ORAL | 0 refills | Status: DC

## 2020-08-02 NOTE — Progress Notes (Signed)
Tramadol 50 mg tablet refilled per pharmacy request.

## 2020-08-09 ENCOUNTER — Encounter: Payer: Self-pay | Admitting: Family

## 2020-08-09 ENCOUNTER — Non-Acute Institutional Stay (SKILLED_NURSING_FACILITY): Payer: Medicare Other | Admitting: Family

## 2020-08-09 DIAGNOSIS — I10 Essential (primary) hypertension: Secondary | ICD-10-CM

## 2020-08-09 DIAGNOSIS — I5032 Chronic diastolic (congestive) heart failure: Secondary | ICD-10-CM

## 2020-08-09 DIAGNOSIS — E1159 Type 2 diabetes mellitus with other circulatory complications: Secondary | ICD-10-CM

## 2020-08-09 DIAGNOSIS — I25118 Atherosclerotic heart disease of native coronary artery with other forms of angina pectoris: Secondary | ICD-10-CM

## 2020-08-09 DIAGNOSIS — Z794 Long term (current) use of insulin: Secondary | ICD-10-CM

## 2020-08-09 DIAGNOSIS — F0390 Unspecified dementia without behavioral disturbance: Secondary | ICD-10-CM

## 2020-08-09 DIAGNOSIS — E785 Hyperlipidemia, unspecified: Secondary | ICD-10-CM

## 2020-08-09 DIAGNOSIS — E1169 Type 2 diabetes mellitus with other specified complication: Secondary | ICD-10-CM | POA: Diagnosis not present

## 2020-08-09 DIAGNOSIS — K219 Gastro-esophageal reflux disease without esophagitis: Secondary | ICD-10-CM

## 2020-08-09 DIAGNOSIS — J449 Chronic obstructive pulmonary disease, unspecified: Secondary | ICD-10-CM

## 2020-08-09 NOTE — Progress Notes (Signed)
Location:    [facility].   N[facility]ing Home Room Number: 202-W Place of Service:  SNF (31) Provider:  Marlowe Sax, NP    Patient Care Team: Gayland Curry, DO as PCP - General (Geriatric Medicine)  Extended Emergency Contact Information Primary Emergency Contact: [name],[name] Address: [address]          [place], [place] [address] [name] of [place] Phone: [phone] Work Phone: [phone] Mobile Phone: [phone] Relation: Daughter Secondary Emergency Contact: [name],[name]  [place] Phone: [phone] Relation: Niece  Code Status:  DNR Goals of care: Advanced Directive information Advanced Directives 08/09/2020  Does Patient Have a Medical Advance Directive? Yes  Type of Advance Directive Out of facility DNR (pink MOST or yellow form)  Does patient want to make changes to medical advance directive? No - Patient declined  Copy of [place] in Chart? -  Would patient like information on creating a medical advance directive? -  Pre-existing out of facility DNR order (yellow form or pink MOST form) Yellow form placed in chart (order not valid for inpatient use)     Chief Complaint  Patient presents with  . Medical Management of Chronic Issues    Routine Visit.   Marland Kitchen Health Maintenance    Discuss the need for Hepatitis C Screening, and Microalbumin.   . Immunizations    Discuss the need for Influenza Vaccine.     HPI:  Pt is a 79 y.o. female seen today for medical management of chronic diseases.she is seen in bed with eye open.she denies any acute issues during visit.Facility Nurse reports no recent fall episode or weight changes.she completed 7 days course of Nitrofurantoin on 07/29/2020 for UTI with positive ESBL.she has also completed working with ST for dysphagia. Her CBG log reviewed reading in the 100's- 200's in the morning and 200's-300' in the afternoon. On Metformin 500 mg tablet twice  daily. She continue on [facility] for COPD.No symptoms of exacerbation reported.   Past Medical History:  Diagnosis Date  . Allergy to ACE inhibitors 03/17/2015  . Alzheimer disease ([place])   . Anxiety   . CAD (coronary artery disease)    a. s/p inferior STEMI 12/29/10 with rotablator atherectomy RCA 12/31/10 and DES x 2 RCA;  b. Lexiscan Myoview (02/2015): mild reversible apical anterior perfusion defect. C. cath 02/2015 50% LAD lesion with patent stent, Tx Rx    . Cardiomyopathy, ischemic 03/17/2015  . Chronic diastolic CHF (congestive heart failure) ([place]) 11/12/2016  . COPD (chronic obstructive pulmonary disease) ([place])   . Coronary atherosclerosis of native coronary artery 04/03/2013  . Dementia ([place])   . Dementia ([place])   . Depression 03/14/2017  . Diverticulosis   . DM2 (diabetes mellitus, type 2) ([place]) 04/02/2013  . Dysphagia 05/10/2017   Minimal 6/20 - pureed diet  . Fibromyalgia 04/20/2008   Qualifier: Diagnosis of  By: Nelson-Smith CMA (AAMA), Dottie    . GERD (gastroesophageal reflux disease)   . Glaucoma   . HTN (hypertension)   . Hyperlipidemia   . Ischemic cardiomyopathy    EF 45%cath 2012 EF55% 5/12; 35% 11/14  . Memory deficit   . Nephrolithiasis   . Obesity (BMI 30-39.9) 10/06/2015  . Osteoarthritis   . Overweight(278.02)   . Respiratory arrest//ACE Inhibitor presumed cause   . Senile dementia without behavioral disturbance ([place]) 10/06/2015  . Sleep apnea   . Small bowel obstruction (HCC)    from a sigmoid stricture  .  Ventricular tachycardia ([place]) 03/17/2015  . Vitamin D deficiency 01/28/2017   Past Surgical History:  Procedure Laterality Date  . bilateral tubal ligation    . CARDIAC CATHETERIZATION N/A 03/18/2015   Procedure: LEFT HEART CATH AND CORONARY ANGIOGRAPHY;  Surgeon: Lorretta Harp, MD;  Location: [facility] CATH LAB;  Service: Cardiovascular;  Laterality: N/A;  . CARDIAC SURGERY    . HIP ARTHROPLASTY Right 11/10/2016   Procedure: ARTHROPLASTY BIPOLAR HIP  (HEMIARTHROPLASTY);  Surgeon: Marchia Bond, MD;  Location: [facility];  Service: Orthopedics;  Laterality: Right;  . 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 LITHOTRIPSY      Allergies  Allergen Reactions  . Lisinopril Other (See Comments)    Reaction:  Respiratory arrest   . Spironolactone Anaphylaxis  . Vancomycin Anaphylaxis and Rash  . Donepezil Diarrhea  . Eggs Or Egg-Derived Products Diarrhea  . Erythromycin Other (See Comments)    Reaction:  Makes pt feel weird   . Macrolides And Ketolides Other (See Comments)    Reaction:  Unknown   . Penicillins Itching and Other (See Comments)    Has patient had a PCN reaction causing immediate rash, facial/tongue/throat swelling, SOB or lightheadedness with hypotension: No Has patient had a PCN reaction causing severe rash involving mucus membranes or skin necrosis: No Has patient had a PCN reaction that required hospitalization No Has patient had a PCN reaction occurring within the last 10 years: No If all of the above answers are "NO", then may proceed with Cephalosporin use.  . Quinapril Hcl Itching  . Angiotensin Receptor Blockers Other (See Comments)    Reaction:  Unknown   . Lipitor [Atorvastatin] Palpitations  . Pentazocine Lactate Other (See Comments)    Reaction:  Unknown   . Propoxyphene Hcl Other (See Comments)    Reaction unknown  . Sulfonamide Derivatives Other (See Comments)    Reaction:  Unknown     Allergies as of 08/09/2020      Reactions   Lisinopril Other (See Comments)   Reaction:  Respiratory arrest    Spironolactone Anaphylaxis   Vancomycin Anaphylaxis, Rash   Donepezil Diarrhea   Eggs Or Egg-derived Products Diarrhea   Erythromycin Other (See Comments)   Reaction:  Makes pt feel weird    Macrolides And Ketolides Other (See Comments)   Reaction:  Unknown    Penicillins Itching, Other (See Comments)   Has patient had a PCN reaction causing immediate rash, facial/tongue/throat  swelling, SOB or lightheadedness with hypotension: No Has patient had a PCN reaction causing severe rash involving mucus membranes or skin necrosis: No Has patient had a PCN reaction that required hospitalization No Has patient had a PCN reaction occurring within the last 10 years: No If all of the above answers are "NO", then may proceed with Cephalosporin use.   Quinapril Hcl Itching   Angiotensin Receptor Blockers Other (See Comments)   Reaction:  Unknown    Lipitor [atorvastatin] Palpitations   Pentazocine Lactate Other (See Comments)   Reaction:  Unknown    Propoxyphene Hcl Other (See Comments)   Reaction unknown   Sulfonamide Derivatives Other (See Comments)   Reaction:  Unknown       Medication List       Accurate as of August 09, 2020 11:06 AM. If you have any questions, ask your nurse or doctor.        amLODipine 5 MG tablet Commonly known as: NORVASC Take 1 tablet (5 mg total) by mouth daily.  aspirin EC 81 MG tablet Take 81 mg by mouth daily.   atorvastatin 40 MG tablet Commonly known as: LIPITOR Take 40 mg by mouth daily.   bisacodyl 10 MG suppository Commonly known as: DULCOLAX If not relieved by MOM, give 10 mg Bisacodyl suppositiory rectally X 1 dose in 24 hours as needed (Do not use constipation standing orders for residents with renal failure/CFR less than 30. Contact MD for orders) (Physician Order)   clopidogrel 75 MG tablet Commonly known as: PLAVIX Take 75 mg by mouth daily.   furosemide 20 MG tablet Commonly known as: LASIX Take 20 mg by mouth daily.   isosorbide mononitrate 30 MG 24 hr tablet Commonly known as: IMDUR Take 15 mg by mouth daily. Take 1/2 tablet = 15mg    magnesium hydroxide 400 MG/5ML suspension Commonly known as: MILK OF MAGNESIA If no BM in 3 days, give 30 cc Milk of Magnesium p.o. x 1 dose in 24 hours as needed (Do not use standing constipation orders for residents with renal failure CFR less than 30. Contact MD for  orders) (Physician Order)   magnesium oxide 400 MG tablet Commonly known as: MAG-OX Take 400 mg by mouth daily.   memantine 10 MG tablet Commonly known as: NAMENDA Take 10 mg by mouth 2 (two) times daily.   metFORMIN 500 MG tablet Commonly known as: GLUCOPHAGE Take 500 mg by mouth 2 (two) times daily with a meal.   metoprolol tartrate 25 MG tablet Commonly known as: LOPRESSOR Take 12.5 mg by mouth every morning.   mometasone-formoterol 100-5 MCG/ACT Aero Commonly known as: DULERA Inhale 2 puffs into the lungs 2 (two) times daily.   NON FORMULARY DIET: Regular   NUTRITIONAL SUPPLEMENTS PO Take 1 each by mouth See admin instructions. Magic Cup with lunch and dinner meal   Omeprazole 20 MG Tbec Take 20 mg by mouth 2 (two) times daily.   RA SALINE ENEMA RE If not relieved by Biscodyl suppository, give disposable Saline Enema rectally X 1 dose/24 hrs as needed (Do not use constipation standing orders for residents with renal failure/CFR less than 30. Contact MD for orders)(Physician Or   sennosides-docusate sodium 8.6-50 MG tablet Commonly known as: SENOKOT-S Take 2 tablets by mouth daily.   traMADol 50 MG tablet Commonly known as: ULTRAM Take 1 tablet (50 mg total) by mouth daily.   Vitamin D3 1.25 MG (50000 UT) Caps Take 1 capsule by mouth once a week.       Review of Systems  Constitutional: Negative for appetite change, chills, fatigue, fever and unexpected weight change.  HENT: Negative for congestion, postnasal drip, rhinorrhea, sinus pressure, sinus pain, sneezing and sore throat.   Eyes: Negative for discharge, redness and itching.  Respiratory: Negative for cough, chest tightness, shortness of breath and wheezing.   Cardiovascular: Negative for chest pain, palpitations and leg swelling.  Gastrointestinal: Negative for abdominal distention, abdominal pain, constipation, diarrhea, nausea and vomiting.  Endocrine: Negative for cold intolerance, heat  intolerance, polydipsia, polyphagia and polyuria.  Genitourinary: Negative for difficulty urinating, dysuria, flank pain and urgency.  Musculoskeletal: Positive for arthralgias and gait problem. Negative for joint swelling, myalgias and neck pain.  Skin: Negative for color change, pallor, rash and wound.  Neurological: Negative for dizziness, speech difficulty, light-headedness, numbness and headaches.  Hematological: Does not bruise/bleed easily.  Psychiatric/Behavioral: Positive for confusion. Negative for agitation, behavioral problems and sleep disturbance. The patient is not nervous/anxious.     Immunization History  Administered Date(s) Administered  .  Influenza Split 08/20/2014  . Influenza, High Dose Seasonal PF 10/06/2013  . Influenza-Unspecified 10/17/2017, 08/29/2018, 08/27/2019  . Moderna SARS-COVID-2 Vaccination 11/20/2019, 12/18/2019  . PPD Test 11/16/2016, 12/05/2016  . Pneumococcal Conjugate-13 10/17/2017  . Pneumococcal Polysaccharide-23 09/24/2009  . Tdap 10/18/2017   Pertinent  Health Maintenance Due  Topic Date Due  . URINE MICROALBUMIN  04/30/2020  . INFLUENZA VACCINE  06/20/2020  . OPHTHALMOLOGY EXAM  09/08/2020 (Originally 11/03/1951)  . HEMOGLOBIN A1C  10/09/2020  . FOOT EXAM  01/26/2021  . DEXA SCAN  Completed  . PNA vac Low Risk Adult  Completed   Fall Risk  04/19/2018  Falls in the past year? No    Vitals:   08/09/20 1100  BP: 131/76  Pulse: 91  Resp: 18  Temp: (!) 97.1 F (36.2 C)  SpO2: 97%  Weight: 116 lb 12.8 oz (53 kg)  Height: 5\' 4"  (1.626 m)   Body mass index is 20.05 kg/m. Physical Exam Vitals and nursing note reviewed.  Constitutional:      General: She is not in acute distress.    Appearance: She is normal weight. She is not ill-appearing.  HENT:     Head: Normocephalic.     Nose: Nose normal. No congestion or rhinorrhea.     Mouth/Throat:     Mouth: Mucous membranes are moist.     Pharynx: Oropharynx is clear. No  oropharyngeal exudate or posterior oropharyngeal erythema.  Eyes:     General: No scleral icterus.       Right eye: No discharge.        Left eye: No discharge.     Conjunctiva/sclera: Conjunctivae normal.     Pupils: Pupils are equal, round, and reactive to light.  Cardiovascular:     Rate and Rhythm: Normal rate and regular rhythm.     Pulses: Normal pulses.     Heart sounds: Normal heart sounds. No murmur heard.  No friction rub. No gallop.   Pulmonary:     Effort: Pulmonary effort is normal. No respiratory distress.     Breath sounds: Normal breath sounds. No wheezing, rhonchi or rales.  Chest:     Chest wall: No tenderness.  Abdominal:     General: Bowel sounds are normal. There is no distension.     Palpations: Abdomen is soft. There is no mass.     Tenderness: There is no abdominal tenderness. There is no right CVA tenderness, left CVA tenderness, guarding or rebound.  Musculoskeletal:        General: No swelling or tenderness.     Cervical back: Normal range of motion. No rigidity or tenderness.     Left lower leg: No edema.     Comments: Moves x 4 extremities without difficulties   Lymphadenopathy:     Cervical: No cervical adenopathy.  Skin:    General: Skin is warm.     Coloration: Skin is not pale.     Findings: No bruising, erythema or rash.  Neurological:     Mental Status: She is alert. Mental status is at baseline.     Coordination: Coordination normal.     Gait: Gait abnormal.  Psychiatric:        Mood and Affect: Mood normal.        Speech: Speech normal.        Behavior: Behavior is cooperative.        Thought Content: Thought content normal.        Cognition and Memory: Memory is  impaired.    Labs reviewed: Recent Labs    10/14/19 0528 10/15/19 0601 10/19/19 0500 10/19/19 0500 10/20/19 0500 10/20/19 0500 10/21/19 0521 10/27/19 0000 12/23/19 0000 12/23/19 0000 01/28/20 0000 04/08/20 0000 05/18/20 0000  NA 146*   < > 142   < > 141   < >  143   < > 139   < > 141 145 139  K 4.4   < > 3.5   < > 4.1   < > 3.7   < > 4.8  --  4.6  --  4.6  CL 106   < > 106   < > 104   < > 110   < > 97*  --  104  --  101  CO2 15*   < > 28   < > 24   < > 19*   < > 25*  --  26*  --  21  GLUCOSE 128*   < > 148*  --  126*  --  166*  --   --   --   --   --   --   BUN 104*   < > 9   < > 10   < > 6*   < > 26*   < > 32* 26* 23*  CREATININE 6.57*   < > 0.78   < > 0.74   < > 0.75   < > 1.1   < > 1.0 0.7 0.8  CALCIUM 8.5*   < > 7.5*   < > 7.8*   < > 8.4*   < > 9.6   < > 9.5 9.9 10.1  MG 1.7   < > 2.0  --  1.6*  --  1.4*  --   --   --   --   --   --   PHOS 7.5*  --   --   --   --   --   --   --   --   --   --   --   --    < > = values in this interval not displayed.   Recent Labs    10/13/19 2117 10/14/19 0528  AST 29  --   ALT 16  --   ALKPHOS 105  --   BILITOT 0.6  --   PROT 9.1*  --   ALBUMIN 3.5 2.6*   Recent Labs    10/19/19 0500 10/19/19 0500 10/20/19 0500 10/20/19 0500 10/21/19 0521 10/21/19 0521 10/27/19 0000 10/27/19 0000 12/23/19 0000 01/28/20 0000 04/08/20 0000  WBC 11.4*   < > 8.7   < > 6.6  --  8.0   < > 8.0 9.7 10.5  NEUTROABS  --   --   --   --   --   --  5  --  5 6  --   HGB 10.3*   < > 10.0*   < > 10.4*   < > 11.7*   < > 12.7 12.1 12.7  HCT 31.9*   < > 31.1*   < > 32.9*   < > 36   < > 37 37 40  MCV 98.5  --  99.0  --  99.7  --   --   --   --   --   --   PLT 154   < > 174   < > 202   < > 329   < > 217 227 222   < > =  values in this interval not displayed.   Lab Results  Component Value Date   TSH 4.36 04/08/2020   Lab Results  Component Value Date   HGBA1C 7.4 04/08/2020   Lab Results  Component Value Date   CHOL 157 01/28/2020   HDL 30 (A) 01/28/2020   LDLCALC 68 01/28/2020   TRIG 296 (A) 01/28/2020   CHOLHDL 2 12/19/2012    Significant Diagnostic Results in last 30 days:  No results found.  Assessment/Plan 1. Essential hypertension B/p controlled. - continue on amlodipine 5 mg tablet ,furosemide 20 mg  tablet ,Imdur 15 mg tablet daily and Metoprolol tartrate 12.5 mg twice daily.   2. Atherosclerosis of native coronary artery of native heart with stable angina pectoris ([place]) No chest pain reported.continue on Plavix 75 mg tablet,ASA EC 81 mg tablet,Atorvastatin 40 mg tablet, Imdur 15 mg tablet daily and Metoprolol tartrate 12.5 mg twice daily.    3. Type 2 diabetes mellitus with other circulatory complication, with long-term current use of insulin (HCC) Lab Results  Component Value Date   HGBA1C 7.4 04/08/2020  Fasting CBg controlled.Post prandial are high.will continue to monitor status post treatment for UTI.consider adding long acting insulin if CBG remains > 250 will recheck Hgb A1C and urine microAlbumin   - continue on Metformin 500 mg tablet twice daily.   TSH level   4. Hyperlipidemia due to type 2 diabetes mellitus (HCC) Latest LDL at goal TRG high. - continue on Atorvastatin 40 mg tablet daily  Will recheck fasting Lipid panel if TRG still high will consider adding fenofibrate 48 mg tablet   5. Chronic obstructive pulmonary disease, unspecified COPD type ([place]) Breathing stable.Not on oxygen. Continue on Dulera   6. Chronic diastolic CHF (congestive heart failure) (HCC) No signs of fluid overload.bilateral trace-1+ edema. Continue on Furosemide 20 mg tablet daily  CMP  7. Senile dementia without behavioral disturbance ([place]) No new behavioral issues reported. Continue with supportive care  Continue on memantine 10 mg tablet twice daily.   8. Gastroesophageal reflux disease without esophagitis Stable.continue on omeprazole 20 mg twice daily  monitor mg level on magnesium 400 mg daily   Family/ staff Communication: Reviewed plan of care with patient and facility Nurse   Labs/tests ordered: CBC/diff,CMP,TSH level,mg level,Hgb A1C and fasting lipid panel

## 2020-08-13 ENCOUNTER — Encounter: Payer: Self-pay | Admitting: Internal Medicine

## 2020-08-13 NOTE — Progress Notes (Signed)
  This encounter was created in error - please disregard. Seen by NP already but was not on spreadsheet

## 2020-08-27 LAB — LIPID PANEL
Cholesterol: 137 (ref 0–200)
HDL: 37 (ref 35–70)
LDL Cholesterol: 75
LDl/HDL Ratio: 3.7
Triglycerides: 128 (ref 40–160)

## 2020-08-27 LAB — HEMOGLOBIN A1C: Hemoglobin A1C: 8

## 2020-08-27 LAB — COMPREHENSIVE METABOLIC PANEL
Albumin: 3.6 (ref 3.5–5.0)
Calcium: 9.6 (ref 8.7–10.7)
GFR calc Af Amer: 90
GFR calc non Af Amer: 81.37
Globulin: 2.6

## 2020-08-27 LAB — BASIC METABOLIC PANEL
BUN: 21 (ref 4–21)
CO2: 26 — AB (ref 13–22)
Chloride: 96 — AB (ref 99–108)
Creatinine: 0.7 (ref 0.5–1.1)
Glucose: 173
Potassium: 4.4 (ref 3.4–5.3)
Sodium: 136 — AB (ref 137–147)

## 2020-08-27 LAB — CBC AND DIFFERENTIAL
HCT: 40 (ref 36–46)
Hemoglobin: 13.3 (ref 12.0–16.0)
Neutrophils Absolute: 7
Platelets: 195 (ref 150–399)
WBC: 10.3

## 2020-08-27 LAB — HEPATIC FUNCTION PANEL
ALT: 11 (ref 7–35)
AST: 12 — AB (ref 13–35)
Alkaline Phosphatase: 94 (ref 25–125)
Bilirubin, Total: 0.3

## 2020-08-27 LAB — CBC: RBC: 4.12 (ref 3.87–5.11)

## 2020-08-27 LAB — MICROALBUMIN, URINE: Microalb, Ur: 1.3

## 2020-08-27 LAB — HM HEPATITIS C SCREENING LAB: HM Hepatitis Screen: NEGATIVE

## 2020-08-27 LAB — TSH: TSH: 4.04 (ref 0.41–5.90)

## 2020-09-01 ENCOUNTER — Other Ambulatory Visit: Payer: Self-pay | Admitting: Family

## 2020-09-01 MED ORDER — TRAMADOL HCL 50 MG PO TABS: 50.0000 mg | ORAL_TABLET | Freq: Every day | ORAL | 0 refills | Status: DC

## 2020-09-01 NOTE — Progress Notes (Signed)
Tramadol 50 mgt tablet one by mouth once daily script send to pharmacy per facility Nurse request.

## 2020-09-03 ENCOUNTER — Non-Acute Institutional Stay (SKILLED_NURSING_FACILITY): Payer: Medicare Other | Admitting: Internal Medicine

## 2020-09-03 ENCOUNTER — Encounter: Payer: Self-pay | Admitting: Internal Medicine

## 2020-09-03 DIAGNOSIS — Z66 Do not resuscitate: Secondary | ICD-10-CM

## 2020-09-03 DIAGNOSIS — I255 Ischemic cardiomyopathy: Secondary | ICD-10-CM | POA: Diagnosis not present

## 2020-09-03 DIAGNOSIS — K219 Gastro-esophageal reflux disease without esophagitis: Secondary | ICD-10-CM

## 2020-09-03 DIAGNOSIS — E785 Hyperlipidemia, unspecified: Secondary | ICD-10-CM

## 2020-09-03 DIAGNOSIS — I25118 Atherosclerotic heart disease of native coronary artery with other forms of angina pectoris: Secondary | ICD-10-CM

## 2020-09-03 DIAGNOSIS — E1169 Type 2 diabetes mellitus with other specified complication: Secondary | ICD-10-CM | POA: Insufficient documentation

## 2020-09-03 DIAGNOSIS — F0150 Vascular dementia without behavioral disturbance: Secondary | ICD-10-CM

## 2020-09-03 DIAGNOSIS — I5032 Chronic diastolic (congestive) heart failure: Secondary | ICD-10-CM | POA: Diagnosis not present

## 2020-09-03 NOTE — Progress Notes (Signed)
Location:  [facility] Room Number: [place]:  SNF [phone]) Provider:  Prestyn Stanco L. Mariea Clonts, D.O., C.M.D.  Gayland Curry, DO  Patient Care Team: Gayland Curry, DO as PCP - General (Geriatric Medicine)  Extended Emergency Contact Information Primary Emergency Contact: [name],[name] Address: [address]          [place],  [address] [name] of [place] Phone: [phone] Work Phone: [phone] Mobile Phone: [phone] Relation: Daughter Secondary Emergency Contact: [name],[name]  [place] Phone: [phone] Relation: Niece  Code Status:  DNR Goals of care: Advanced Directive information Advanced Directives 09/03/2020  Does Patient Have a Medical Advance Directive? Yes  Type of Advance Directive Out of facility DNR (pink MOST or yellow form)  Does patient want to make changes to medical advance directive? No - Patient declined  Copy of [place] in Chart? -  Would patient like information on creating a medical advance directive? -  Pre-existing out of facility DNR order (yellow form or pink MOST form) -     Chief Complaint  Patient presents with  . Medical Management of Chronic Issues    Routine [facility] visit    HPI:  Pt is a 79 y.o. female seen today for medical management of chronic diseases.  She has a medical history significant for vascular dementia without behavioral disturbance, hip fracture, type 2 diabetes with hyperlipidemia and hypertension, tobacco abuse with COPD, constipation, ischemic cardiomyopathy, ventricular tachycardia, prior Covid pneumonia, dysphagia, prior ESBL UTI, fibromyalgia, insomnia, depression, ACE inhibitor allergy, and vitamin D deficiency.  She lives here at [facility] for long-term care.  She is under the care of [facility].  She is alert and oriented to self, able to feed herself with cues but is frequently distracted during her meals.  She has  had challenges with maintaining her weight as a result.  She has no chewing or swallowing problems and no skin breakdown.  Is well-known throughout the facility for I am rolling around in her wheelchair and saying "help me" or "come here" without any specific request.  Nursing has not made any notes with concerns.  Resident had a tooth extracted with the dentist and has recovered well.  Vitals reviewed and stable.  CBGs reviewed and range from lower 100s to 331 but primarily upper 100s to lower 200s fasting.  No hypoglycemia seen in past 30 days.  When seen today, she had been sitting in the hall asking people to come over and push her down the hall.  She was pleasant and denied pain.  Reports sleeping well, moving her bowels and nursing does not contest any of this.    Past Medical History:  Diagnosis Date  . Allergy to ACE inhibitors 03/17/2015  . Alzheimer disease ([place])   . Anxiety   . CAD (coronary artery disease)    a. s/p inferior STEMI 12/29/10 with rotablator atherectomy RCA 12/31/10 and DES x 2 RCA;  b. Lexiscan Myoview (02/2015): mild reversible apical anterior perfusion defect. C. cath 02/2015 50% LAD lesion with patent stent, Tx Rx    . Cardiomyopathy, ischemic 03/17/2015  . Chronic diastolic CHF (congestive heart failure) ([place]) 11/12/2016  . COPD (chronic obstructive pulmonary disease) ([place])   . Coronary atherosclerosis of native coronary artery 04/03/2013  . Dementia ([place])   . Dementia ([place])   . Depression 03/14/2017  . Diverticulosis   . DM2 (diabetes mellitus, type 2) ([place]) 04/02/2013  .  Dysphagia 05/10/2017   Minimal 6/20 - pureed diet  . Fibromyalgia 04/20/2008   Qualifier: Diagnosis of  By: Nelson-Smith CMA (AAMA), Dottie    . GERD (gastroesophageal reflux disease)   . Glaucoma   . HTN (hypertension)   . Hyperlipidemia   . Ischemic cardiomyopathy    EF 45%cath 2012 EF55% 5/12; 35% 11/14  . Memory deficit   . Nephrolithiasis   . Obesity (BMI 30-39.9) 10/06/2015  .  Osteoarthritis   . Overweight(278.02)   . Respiratory arrest//ACE Inhibitor presumed cause   . Senile dementia without behavioral disturbance ([place]) 10/06/2015  . Sleep apnea   . Small bowel obstruction (HCC)    from a sigmoid stricture  . Ventricular tachycardia ([place]) 03/17/2015  . Vitamin D deficiency 01/28/2017   Past Surgical History:  Procedure Laterality Date  . bilateral tubal ligation    . CARDIAC CATHETERIZATION N/A 03/18/2015   Procedure: LEFT HEART CATH AND CORONARY ANGIOGRAPHY;  Surgeon: Lorretta Harp, MD;  Location: [facility] CATH LAB;  Service: Cardiovascular;  Laterality: N/A;  . CARDIAC SURGERY    . HIP ARTHROPLASTY Right 11/10/2016   Procedure: ARTHROPLASTY BIPOLAR HIP (HEMIARTHROPLASTY);  Surgeon: Marchia Bond, MD;  Location: [facility];  Service: Orthopedics;  Laterality: Right;  . 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 LITHOTRIPSY      Allergies  Allergen Reactions  . Lisinopril Other (See Comments)    Reaction:  Respiratory arrest   . Spironolactone Anaphylaxis  . Vancomycin Anaphylaxis and Rash  . Donepezil Diarrhea  . Eggs Or Egg-Derived Products Diarrhea  . Erythromycin Other (See Comments)    Reaction:  Makes pt feel weird   . Macrolides And Ketolides Other (See Comments)    Reaction:  Unknown   . Penicillins Itching and Other (See Comments)    Has patient had a PCN reaction causing immediate rash, facial/tongue/throat swelling, SOB or lightheadedness with hypotension: No Has patient had a PCN reaction causing severe rash involving mucus membranes or skin necrosis: No Has patient had a PCN reaction that required hospitalization No Has patient had a PCN reaction occurring within the last 10 years: No If all of the above answers are "NO", then may proceed with Cephalosporin use.  . Quinapril Hcl Itching  . Angiotensin Receptor Blockers Other (See Comments)    Reaction:  Unknown   . Lipitor [Atorvastatin] Palpitations  .  Pentazocine Lactate Other (See Comments)    Reaction:  Unknown   . Propoxyphene Hcl Other (See Comments)    Reaction unknown  . Sulfonamide Derivatives Other (See Comments)    Reaction:  Unknown     Outpatient Encounter Medications as of 09/03/2020  Medication Sig  . amLODipine (NORVASC) 5 MG tablet Take 1 tablet (5 mg total) by mouth daily.  Marland Kitchen aspirin EC 81 MG tablet Take 81 mg by mouth daily.  Marland Kitchen atorvastatin (LIPITOR) 40 MG tablet Take 40 mg by mouth daily.   . bisacodyl (DULCOLAX) 10 MG suppository If not relieved by MOM, give 10 mg Bisacodyl suppositiory rectally X 1 dose in 24 hours as needed (Do not use constipation standing orders for residents with renal failure/CFR less than 30. Contact MD for orders) (Physician Order)  . Cholecalciferol (VITAMIN D3) 1.25 MG (50000 UT) CAPS Take 1 capsule by mouth once a week.  . clopidogrel (PLAVIX) 75 MG tablet Take 75 mg by mouth daily.   . Cranberry-Vitamin C-Inulin (UTI-STAT) LIQD Take by mouth. Give 30 MLS by mouth every day for  UTI  . furosemide (LASIX) 20 MG tablet Take 20 mg by mouth daily.   . isosorbide mononitrate (IMDUR) 30 MG 24 hr tablet Take 15 mg by mouth daily. Take 1/2 tablet = 15mg   . magnesium hydroxide (MILK OF MAGNESIA) 400 MG/5ML suspension If no BM in 3 days, give 30 cc Milk of Magnesium p.o. x 1 dose in 24 hours as needed (Do not use standing constipation orders for residents with renal failure CFR less than 30. Contact MD for orders) (Physician Order)  . magnesium oxide (MAG-OX) 400 MG tablet Take 400 mg by mouth daily.  . memantine (NAMENDA) 10 MG tablet Take 10 mg by mouth 2 (two) times daily.   . metFORMIN (GLUCOPHAGE) 500 MG tablet Take 500 mg by mouth 2 (two) times daily with a meal.  . metoprolol tartrate (LOPRESSOR) 25 MG tablet Take 12.5 mg by mouth every morning.  . mometasone-formoterol (DULERA) 100-5 MCG/ACT AERO Inhale 2 puffs into the lungs 2 (two) times daily.   . NON FORMULARY DIET: Regular  . NUTRITIONAL  SUPPLEMENTS PO Take 1 each by mouth See admin instructions. Magic Cup with lunch and dinner meal  . Omeprazole 20 MG TBEC Take 20 mg by mouth 2 (two) times daily.   . sennosides-docusate sodium (SENOKOT-S) 8.6-50 MG tablet Take 2 tablets by mouth daily.  . Sodium Phosphates (RA SALINE ENEMA RE) If not relieved by Biscodyl suppository, give disposable Saline Enema rectally X 1 dose/24 hrs as needed (Do not use constipation standing orders for residents with renal failure/CFR less than 30. Contact MD for orders)(Physician Or  . traMADol (ULTRAM) 50 MG tablet Take 1 tablet (50 mg total) by mouth daily.   No facility-administered encounter medications on file as of 09/03/2020.    Review of Systems  Constitutional: Negative for chills, fever and malaise/fatigue.  Eyes: Negative for blurred vision.  Respiratory: Negative for shortness of breath.   Cardiovascular: Negative for chest pain, palpitations and leg swelling.  Gastrointestinal: Negative for abdominal pain and constipation.  Genitourinary: Negative for dysuria.  Musculoskeletal: Negative for back pain, falls and joint pain.  Skin: Negative for itching and rash.  Neurological: Negative for dizziness and loss of consciousness.  Endo/Heme/Allergies:       Diabetes  Psychiatric/Behavioral: Positive for memory loss. Negative for depression. The patient is not nervous/anxious and does not have insomnia.     Immunization History  Administered Date(s) Administered  . Influenza Split 08/20/2014  . Influenza, High Dose Seasonal PF 10/06/2013  . Influenza-Unspecified 10/17/2017, 08/29/2018, 08/27/2019  . Moderna SARS-COVID-2 Vaccination 11/20/2019, 12/18/2019  . PPD Test 11/16/2016, 12/05/2016  . Pneumococcal Conjugate-13 10/17/2017  . Pneumococcal Polysaccharide-23 09/24/2009  . Tdap 10/18/2017   Pertinent  Health Maintenance Due  Topic Date Due  . INFLUENZA VACCINE  06/20/2020  . OPHTHALMOLOGY EXAM  09/08/2020 (Originally 11/03/1951)    . HEMOGLOBIN A1C  10/09/2020  . FOOT EXAM  01/26/2021  . URINE MICROALBUMIN  08/27/2021  . DEXA SCAN  Completed  . PNA vac Low Risk Adult  Completed   Fall Risk  04/19/2018  Falls in the past year? No   Functional Status Survey:    Vitals:   09/03/20 1239  BP: 129/63  Pulse: 73  Resp: 18  Temp: (!) 97.5 F (36.4 C)  Weight: 116 lb 12.8 oz (53 kg)  Height: 5\' 4"  (1.626 m)   Body mass index is 20.05 kg/m. Physical Exam Vitals and nursing note reviewed.  Constitutional:      General: She  is not in acute distress.    Appearance: Normal appearance. She is not toxic-appearing.  HENT:     Head: Normocephalic and atraumatic.  Eyes:     Conjunctiva/sclera: Conjunctivae normal.     Pupils: Pupils are equal, round, and reactive to light.  Cardiovascular:     Rate and Rhythm: Normal rate and regular rhythm.     Pulses: Normal pulses.     Heart sounds: Normal heart sounds.  Pulmonary:     Effort: Pulmonary effort is normal.     Breath sounds: Normal breath sounds. No wheezing, rhonchi or rales.  Abdominal:     General: Bowel sounds are normal.  Musculoskeletal:        General: Normal range of motion.     Cervical back: Neck supple.     Right lower leg: No edema.     Left lower leg: No edema.  Lymphadenopathy:     Cervical: No cervical adenopathy.  Skin:    General: Skin is warm and dry.  Neurological:     General: No focal deficit present.     Mental Status: She is alert. Mental status is at baseline.     Comments: Rolls around facility in manual wheelchair  Psychiatric:        Mood and Affect: Mood normal.     Comments: Pleasant, repeating her usual things she says, but does answer questions in what seems to be appropriate responses     Labs reviewed: Recent Labs    10/14/19 0528 10/15/19 0601 10/19/19 0500 10/19/19 0500 10/20/19 0500 10/20/19 0500 10/21/19 0521 10/27/19 0000 12/23/19 0000 12/23/19 0000 01/28/20 0000 04/08/20 0000 05/18/20 0000  NA  146*   < > 142   < > 141   < > 143   < > 139   < > 141 145 139  K 4.4   < > 3.5   < > 4.1   < > 3.7   < > 4.8  --  4.6  --  4.6  CL 106   < > 106   < > 104   < > 110   < > 97*  --  104  --  101  CO2 15*   < > 28   < > 24   < > 19*   < > 25*  --  26*  --  21  GLUCOSE 128*   < > 148*  --  126*  --  166*  --   --   --   --   --   --   BUN 104*   < > 9   < > 10   < > 6*   < > 26*   < > 32* 26* 23*  CREATININE 6.57*   < > 0.78   < > 0.74   < > 0.75   < > 1.1   < > 1.0 0.7 0.8  CALCIUM 8.5*   < > 7.5*   < > 7.8*   < > 8.4*   < > 9.6   < > 9.5 9.9 10.1  MG 1.7   < > 2.0  --  1.6*  --  1.4*  --   --   --   --   --   --   PHOS 7.5*  --   --   --   --   --   --   --   --   --   --   --   --    < > =  values in this interval not displayed.   Recent Labs    10/13/19 2117 10/14/19 0528  AST 29  --   ALT 16  --   ALKPHOS 105  --   BILITOT 0.6  --   PROT 9.1*  --   ALBUMIN 3.5 2.6*   Recent Labs    10/19/19 0500 10/19/19 0500 10/20/19 0500 10/20/19 0500 10/21/19 0521 10/21/19 0521 10/27/19 0000 10/27/19 0000 12/23/19 0000 01/28/20 0000 04/08/20 0000  WBC 11.4*   < > 8.7   < > 6.6  --  8.0   < > 8.0 9.7 10.5  NEUTROABS  --   --   --   --   --   --  5  --  5 6  --   HGB 10.3*   < > 10.0*   < > 10.4*   < > 11.7*   < > 12.7 12.1 12.7  HCT 31.9*   < > 31.1*   < > 32.9*   < > 36   < > 37 37 40  MCV 98.5  --  99.0  --  99.7  --   --   --   --   --   --   PLT 154   < > 174   < > 202   < > 329   < > 217 227 222   < > = values in this interval not displayed.   Lab Results  Component Value Date   TSH 4.36 04/08/2020   Lab Results  Component Value Date   HGBA1C 7.4 04/08/2020   Lab Results  Component Value Date   CHOL 157 01/28/2020   HDL 30 (A) 01/28/2020   LDLCALC 68 01/28/2020   TRIG 296 (A) 01/28/2020   CHOLHDL 2 12/19/2012   Assessment/Plan 1. Vascular dementia without behavioral disturbance (HCC) -repetitive speech, pleasant though and does answer questions and make needs  known, cont SNF support  2. Type 2 diabetes mellitus with other specified complication, without long-term current use of insulin ([place]) Lab Results  Component Value Date   HGBA1C 8 08/27/2020  continue on metformin -CBGs reasonable considering her comorbidities and SNF status -no hypoglycemia -urine micro updated -eye exams and foot exams should be done on site by visiting podiatry and ophtho   3. Hyperlipidemia due to type 2 diabetes mellitus ([place]) -remains on lipitor 40mg , not sure 80mg  would be tolerated  -no reports of difficulties taking medications so will leave her on this to help prevent strokes and MI Lab Results  Component Value Date   CHOL 137 08/27/2020   HDL 37 08/27/2020   LDLCALC 75 08/27/2020   TRIG 128 08/27/2020   CHOLHDL 2 12/19/2012   4. Chronic diastolic CHF (congestive heart failure) (HCC) -no recent difficulty with volume overload, cont regular lasix, lopressor  5. Cardiomyopathy, ischemic -stable, cont lopressor, imdur, asa, plavix and lipitor   6. Gastroesophageal reflux disease without esophagitis -no related complaints--on two anticoagulants so maintain PPI  7. Atherosclerosis of native coronary artery of native heart with stable angina pectoris ([place]) -no recent complications with her regimen on board--cont asa, plavix, imdur, bp control, lipitor  8. DNR (do not resuscitate) - Do not attempt resuscitation (DNR)  Family/ staff Communication: d/w SNF nurse  Labs/tests ordered:  Just had labs--next needed in 4-6 mos and prn   Jouri Threat L. Talajah Slimp, D.O. [facility] [address], [place] [address] Cell Phone (Mon-Fri 8am-5pm):  [phone] On Call:  [phone] & follow prompts after 5pm & weekends Office Phone:  [phone] Office Fax:  [phone]

## 2020-09-29 ENCOUNTER — Other Ambulatory Visit: Payer: Self-pay | Admitting: Family

## 2020-09-29 DIAGNOSIS — M797 Fibromyalgia: Secondary | ICD-10-CM

## 2020-09-29 DIAGNOSIS — M8949 Other hypertrophic osteoarthropathy, multiple sites: Secondary | ICD-10-CM

## 2020-09-29 DIAGNOSIS — M1990 Unspecified osteoarthritis, unspecified site: Secondary | ICD-10-CM | POA: Insufficient documentation

## 2020-09-29 DIAGNOSIS — M159 Polyosteoarthritis, unspecified: Secondary | ICD-10-CM

## 2020-09-29 MED ORDER — TRAMADOL HCL 50 MG PO TABS: 50.0000 mg | ORAL_TABLET | Freq: Every day | ORAL | 0 refills | Status: DC

## 2020-09-29 NOTE — Progress Notes (Signed)
Tramadol script send to [facility] for pain.

## 2020-10-06 ENCOUNTER — Encounter: Payer: Self-pay | Admitting: Orthopedic Surgery

## 2020-10-06 NOTE — Progress Notes (Signed)
Location:    [facility] Room Number: 644/I Place of Service:  SNF [phone]) Provider:  Windell Moulding NP  Gayland Curry, DO  Patient Care Team: Gayland Curry, DO as PCP - General (Geriatric Medicine)  Extended Emergency Contact Information Primary Emergency Contact: [name],[name] Address: [address]          [place], [place] [address] [name] of [place] Phone: [phone] Work Phone: [phone] Mobile Phone: [phone] Relation: Daughter Secondary Emergency Contact: [name],[name]  [place] Phone: [phone] Relation: Niece  Code Status:  DNR Goals of care: Advanced Directive information Advanced Directives 10/06/2020  Does Patient Have a Medical Advance Directive? Yes  Type of Paramedic of [place];Living will;Out of facility DNR (pink MOST or yellow form)  Does patient want to make changes to medical advance directive? No - Patient declined  Copy of [place] in Chart? Yes - validated most recent copy scanned in chart (See row information)  Would patient like information on creating a medical advance directive? -  Pre-existing out of facility DNR order (yellow form or pink MOST form) Yellow form placed in chart (order not valid for inpatient use)     Chief Complaint  Patient presents with  . Medical Management of Chronic Issues    Routine Visit of Medical Management  . Quality Metric Gaps    Eye Exam    HPI:  Pt is a 79 y.o. female seen today for medical management of chronic diseases.     Past Medical History:  Diagnosis Date  . Allergy to ACE inhibitors 03/17/2015  . Alzheimer disease ([place])   . Anxiety   . CAD (coronary artery disease)    a. s/p inferior STEMI 12/29/10 with rotablator atherectomy RCA 12/31/10 and DES x 2 RCA;  b. Lexiscan Myoview (02/2015): mild reversible apical anterior perfusion defect. C. cath 02/2015 50% LAD lesion with patent stent, Tx Rx     . Cardiomyopathy, ischemic 03/17/2015  . Chronic diastolic CHF (congestive heart failure) ([place]) 11/12/2016  . COPD (chronic obstructive pulmonary disease) ([place])   . Coronary atherosclerosis of native coronary artery 04/03/2013  . Dementia ([place])   . Dementia ([place])   . Depression 03/14/2017  . Diverticulosis   . DM2 (diabetes mellitus, type 2) ([place]) 04/02/2013  . Dysphagia 05/10/2017   Minimal 6/20 - pureed diet  . Fibromyalgia 04/20/2008   Qualifier: Diagnosis of  By: Nelson-Smith CMA (AAMA), Dottie    . GERD (gastroesophageal reflux disease)   . Glaucoma   . HTN (hypertension)   . Hyperlipidemia   . Ischemic cardiomyopathy    EF 45%cath 2012 EF55% 5/12; 35% 11/14  . Memory deficit   . Nephrolithiasis   . Obesity (BMI 30-39.9) 10/06/2015  . Osteoarthritis   . Overweight(278.02)   . Respiratory arrest//ACE Inhibitor presumed cause   . Senile dementia without behavioral disturbance ([place]) 10/06/2015  . Sleep apnea   . Small bowel obstruction ([facility])    from a sigmoid stricture  . Ventricular tachycardia ([place]) 03/17/2015  . Vitamin D deficiency 01/28/2017   Past Surgical History:  Procedure Laterality Date  . bilateral tubal ligation    . CARDIAC CATHETERIZATION N/A 03/18/2015   Procedure: LEFT HEART CATH AND CORONARY ANGIOGRAPHY;  Surgeon: Lorretta Harp, MD;  Location: [facility] CATH LAB;  Service: Cardiovascular;  Laterality: N/A;  . CARDIAC SURGERY    . HIP ARTHROPLASTY Right 11/10/2016   Procedure: ARTHROPLASTY BIPOLAR HIP (HEMIARTHROPLASTY);  Surgeon: Marchia Bond, MD;  Location: [facility];  Service: Orthopedics;  Laterality: Right;  . 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 LITHOTRIPSY      Allergies  Allergen Reactions  . Lisinopril Other (See Comments)    Reaction:  Respiratory arrest   . Spironolactone Anaphylaxis  . Vancomycin Anaphylaxis and Rash  . Donepezil Diarrhea  . Eggs Or Egg-Derived Products Diarrhea  . Erythromycin Other (See Comments)      Reaction:  Makes pt feel weird   . Macrolides And Ketolides Other (See Comments)    Reaction:  Unknown   . Penicillins Itching and Other (See Comments)    Has patient had a PCN reaction causing immediate rash, facial/tongue/throat swelling, SOB or lightheadedness with hypotension: No Has patient had a PCN reaction causing severe rash involving mucus membranes or skin necrosis: No Has patient had a PCN reaction that required hospitalization No Has patient had a PCN reaction occurring within the last 10 years: No If all of the above answers are "NO", then may proceed with Cephalosporin use.  . Quinapril Hcl Itching  . Angiotensin Receptor Blockers Other (See Comments)    Reaction:  Unknown   . Lipitor [Atorvastatin] Palpitations  . Pentazocine Lactate Other (See Comments)    Reaction:  Unknown   . Propoxyphene Hcl Other (See Comments)    Reaction unknown  . Sulfonamide Derivatives Other (See Comments)    Reaction:  Unknown     Allergies as of 10/06/2020      Reactions   Lisinopril Other (See Comments)   Reaction:  Respiratory arrest    Spironolactone Anaphylaxis   Vancomycin Anaphylaxis, Rash   Donepezil Diarrhea   Eggs Or Egg-derived Products Diarrhea   Erythromycin Other (See Comments)   Reaction:  Makes pt feel weird    Macrolides And Ketolides Other (See Comments)   Reaction:  Unknown    Penicillins Itching, Other (See Comments)   Has patient had a PCN reaction causing immediate rash, facial/tongue/throat swelling, SOB or lightheadedness with hypotension: No Has patient had a PCN reaction causing severe rash involving mucus membranes or skin necrosis: No Has patient had a PCN reaction that required hospitalization No Has patient had a PCN reaction occurring within the last 10 years: No If all of the above answers are "NO", then may proceed with Cephalosporin use.   Quinapril Hcl Itching   Angiotensin Receptor Blockers Other (See Comments)   Reaction:  Unknown     Lipitor [atorvastatin] Palpitations   Pentazocine Lactate Other (See Comments)   Reaction:  Unknown    Propoxyphene Hcl Other (See Comments)   Reaction unknown   Sulfonamide Derivatives Other (See Comments)   Reaction:  Unknown       Medication List       Accurate as of October 06, 2020 11:32 AM. If you have any questions, ask your nurse or doctor.        amLODipine 5 MG tablet Commonly known as: NORVASC Take 1 tablet (5 mg total) by mouth daily.   aspirin EC 81 MG tablet Take 81 mg by mouth daily.   atorvastatin 40 MG tablet Commonly known as: LIPITOR Take 40 mg by mouth daily.   bisacodyl 10 MG suppository Commonly known as: DULCOLAX If not relieved by MOM, give 10 mg Bisacodyl suppositiory rectally X 1 dose in 24 hours as needed (Do not use constipation standing orders for residents with renal failure/CFR less than 30. Contact MD for orders) (Physician  Order)   clopidogrel 75 MG tablet Commonly known as: PLAVIX Take 75 mg by mouth daily.   furosemide 20 MG tablet Commonly known as: LASIX Take 20 mg by mouth daily.   isosorbide mononitrate 30 MG 24 hr tablet Commonly known as: IMDUR Take 15 mg by mouth daily. Take 1/2 tablet = 15mg    magnesium hydroxide 400 MG/5ML suspension Commonly known as: MILK OF MAGNESIA If no BM in 3 days, give 30 cc Milk of Magnesium p.o. x 1 dose in 24 hours as needed (Do not use standing constipation orders for residents with renal failure CFR less than 30. Contact MD for orders) (Physician Order)   magnesium oxide 400 MG tablet Commonly known as: MAG-OX Take 400 mg by mouth daily.   memantine 10 MG tablet Commonly known as: NAMENDA Take 10 mg by mouth 2 (two) times daily.   metFORMIN 500 MG tablet Commonly known as: GLUCOPHAGE Take 500 mg by mouth 2 (two) times daily with a meal.   metoprolol tartrate 25 MG tablet Commonly known as: LOPRESSOR Take 12.5 mg by mouth every morning.   mometasone-formoterol 100-5 MCG/ACT  Aero Commonly known as: DULERA Inhale 2 puffs into the lungs 2 (two) times daily.   NON FORMULARY DIET: Regular   NUTRITIONAL SUPPLEMENTS PO Take 1 each by mouth See admin instructions. Magic Cup with lunch and dinner meal   Omeprazole 20 MG Tbec Take 20 mg by mouth 2 (two) times daily.   RA SALINE ENEMA RE If not relieved by Biscodyl suppository, give disposable Saline Enema rectally X 1 dose/24 hrs as needed (Do not use constipation standing orders for residents with renal failure/CFR less than 30. Contact MD for orders)(Physician Or   sennosides-docusate sodium 8.6-50 MG tablet Commonly known as: SENOKOT-S Take 2 tablets by mouth daily.   traMADol 50 MG tablet Commonly known as: ULTRAM Take 1 tablet (50 mg total) by mouth daily.   UTI-Stat Liqd Take by mouth. Give 30 MLS by mouth every day for UTI   Vitamin D3 1.25 MG (50000 UT) Caps Take 1 capsule by mouth once a week.       Review of Systems  Immunization History  Administered Date(s) Administered  . Influenza Split 08/20/2014  . Influenza, High Dose Seasonal PF 10/06/2013  . Influenza-Unspecified 10/17/2017, 08/29/2018, 08/27/2019  . Moderna SARS-COVID-2 Vaccination 11/20/2019, 12/18/2019  . PPD Test 11/16/2016, 12/05/2016  . Pneumococcal Conjugate-13 10/17/2017  . Pneumococcal Polysaccharide-23 09/24/2009  . Tdap 10/18/2017   Pertinent  Health Maintenance Due  Topic Date Due  . OPHTHALMOLOGY EXAM  Never done  . INFLUENZA VACCINE  02/17/2021 (Originally 06/20/2020)  . FOOT EXAM  01/26/2021  . HEMOGLOBIN A1C  02/25/2021  . URINE MICROALBUMIN  08/27/2021  . DEXA SCAN  Completed  . PNA vac Low Risk Adult  Completed   Fall Risk  09/06/2020 04/19/2018  Falls in the past year? - No  Risk for fall due to : History of fall(s);Impaired balance/gait;Impaired mobility;Medication side effect;Mental status change -  Follow up Falls evaluation completed;Education provided;Falls prevention discussed;Follow up  appointment -   Functional Status Survey:    Vitals:   10/06/20 1126  BP: 129/75  Pulse: 75  Resp: 19  Temp: 97.8 F (36.6 C)  SpO2: 97%  Weight: 121 lb 3.2 oz (55 kg)  Height: 5\' 4"  (1.626 m)   Body mass index is 20.8 kg/m. Physical Exam  Labs reviewed: Recent Labs    10/14/19 0528 10/15/19 0601 10/19/19 0500 10/19/19 0500 10/20/19 0500  10/20/19 0500 10/21/19 0521 10/27/19 0000 01/28/20 0000 01/28/20 0000 04/08/20 0000 05/18/20 0000 08/27/20 0000  NA 146*   < > 142   < > 141   < > 143   < > 141   < > 145 139 136*  K 4.4   < > 3.5   < > 4.1   < > 3.7   < > 4.6  --   --  4.6 4.4  CL 106   < > 106   < > 104   < > 110   < > 104  --   --  101 96*  CO2 15*   < > 28   < > 24   < > 19*   < > 26*  --   --  21 26*  GLUCOSE 128*   < > 148*  --  126*  --  166*  --   --   --   --   --   --   BUN 104*   < > 9   < > 10   < > 6*   < > 32*   < > 26* 23* 21  CREATININE 6.57*   < > 0.78   < > 0.74   < > 0.75   < > 1.0   < > 0.7 0.8 0.7  CALCIUM 8.5*   < > 7.5*   < > 7.8*   < > 8.4*   < > 9.5   < > 9.9 10.1 9.6  MG 1.7   < > 2.0  --  1.6*  --  1.4*  --   --   --   --   --   --   PHOS 7.5*  --   --   --   --   --   --   --   --   --   --   --   --    < > = values in this interval not displayed.   Recent Labs    10/13/19 2117 10/14/19 0528 08/27/20 0000  AST 29  --  12*  ALT 16  --  11  ALKPHOS 105  --  94  BILITOT 0.6  --   --   PROT 9.1*  --   --   ALBUMIN 3.5 2.6* 3.6   Recent Labs    10/19/19 0500 10/19/19 0500 10/20/19 0500 10/20/19 0500 10/21/19 0521 10/27/19 0000 12/23/19 0000 12/23/19 0000 01/28/20 0000 04/08/20 0000 08/27/20 0000  WBC 11.4*   < > 8.7   < > 6.6   < > 8.0   < > 9.7 10.5 10.3  NEUTROABS  --   --   --   --   --    < > 5  --  6  --  7  HGB 10.3*   < > 10.0*   < > 10.4*   < > 12.7   < > 12.1 12.7 13.3  HCT 31.9*   < > 31.1*   < > 32.9*   < > 37   < > 37 40 40  MCV 98.5  --  99.0  --  99.7  --   --   --   --   --   --   PLT 154   < > 174   <  > 202   < > 217   < > 227 222 195   < > = values in  this interval not displayed.   Lab Results  Component Value Date   TSH 4.04 08/27/2020   Lab Results  Component Value Date   HGBA1C 8 08/27/2020   Lab Results  Component Value Date   CHOL 137 08/27/2020   HDL 37 08/27/2020   LDLCALC 75 08/27/2020   TRIG 128 08/27/2020   CHOLHDL 2 12/19/2012    Significant Diagnostic Results in last 30 days:  No results found.  Assessment/Plan There are no diagnoses linked to this encounter.   Family/ staff Communication:   Labs/tests ordered:      This encounter was created in error - please disregard.

## 2020-10-07 ENCOUNTER — Non-Acute Institutional Stay (SKILLED_NURSING_FACILITY): Payer: Medicare Other | Admitting: Orthopedic Surgery

## 2020-10-07 ENCOUNTER — Encounter: Payer: Self-pay | Admitting: Orthopedic Surgery

## 2020-10-07 DIAGNOSIS — E1169 Type 2 diabetes mellitus with other specified complication: Secondary | ICD-10-CM | POA: Diagnosis not present

## 2020-10-07 DIAGNOSIS — F0150 Vascular dementia without behavioral disturbance: Secondary | ICD-10-CM | POA: Diagnosis not present

## 2020-10-07 DIAGNOSIS — K219 Gastro-esophageal reflux disease without esophagitis: Secondary | ICD-10-CM

## 2020-10-07 DIAGNOSIS — R1312 Dysphagia, oropharyngeal phase: Secondary | ICD-10-CM

## 2020-10-07 DIAGNOSIS — B351 Tinea unguium: Secondary | ICD-10-CM

## 2020-10-07 DIAGNOSIS — E1159 Type 2 diabetes mellitus with other circulatory complications: Secondary | ICD-10-CM

## 2020-10-07 DIAGNOSIS — I25118 Atherosclerotic heart disease of native coronary artery with other forms of angina pectoris: Secondary | ICD-10-CM

## 2020-10-07 DIAGNOSIS — I152 Hypertension secondary to endocrine disorders: Secondary | ICD-10-CM

## 2020-10-07 NOTE — Progress Notes (Signed)
Location:    [facility] Room Number: 106/Y Place of Service:  SNF [phone]) Provider: Windell Moulding NP   Gayland Curry, DO  Patient Care Team: Gayland Curry, DO as PCP - General (Geriatric Medicine)  Extended Emergency Contact Information Primary Emergency Contact: [name],[name] Address: [address]          [place], [place] [address] [name] of [place] Phone: [phone] Work Phone: [phone] Mobile Phone: [phone] Relation: Daughter Secondary Emergency Contact: [name],[name]  [place] Phone: [phone] Relation: Niece  Code Status:  DNR Goals of care: Advanced Directive information Advanced Directives 10/07/2020  Does Patient Have a Medical Advance Directive? Yes  Type of Paramedic of [place];Living will;Out of facility DNR (pink MOST or yellow form)  Does patient want to make changes to medical advance directive? No - Patient declined  Copy of [place] in Chart? Yes - validated most recent copy scanned in chart (See row information)  Would patient like information on creating a medical advance directive? -  Pre-existing out of facility DNR order (yellow form or pink MOST form) Yellow form placed in chart (order not valid for inpatient use)     Chief Complaint  Patient presents with  . Medical Management of Chronic Issues    Routine Visit of Medical Management    HPI:  Pt is a 79 y.o. female seen today for medical management of chronic diseases.    She is a resident of [facility], seen at bedside today.   PMH includes: hypertension, T2DM, cardiomyopathy, COPD, dysphagia, GERD, vascular dementia, OA, and fibromyalgia.   Continues to be alert and oriented to self. Ambulates using a wheelchair and can be seen daily in the hallways. Lately she has been using some foul language like "damn" or "hell" when you approach her. Also says " help  me" often. She is very repetitive with phrases and has trouble stating if anything is bothering her. No recent behavioral issues or agitation reported.  Her appetite is healthy and staff states she is eating at least 2 meals daily. She can still feed herself. Still on regular diet with thin liquids.   Blood glucose continues to be high. Average readings are above 200. Today she was 204. Unclear if staff is checking AM glucose after patient has started breakfast. No recent hypoglycemic events reported.   She is weighed twice a week to monitor CHF. In the past 3 months she has maintained her weight, with last recording of 121.2lbs.   No new issues reported from facility nurse. She will be getting her Moderna booster vaccine today.   Past Medical History:  Diagnosis Date  . Allergy to ACE inhibitors 03/17/2015  . Alzheimer disease ([place])   . Anxiety   . CAD (coronary artery disease)    a. s/p inferior STEMI 12/29/10 with rotablator atherectomy RCA 12/31/10 and DES x 2 RCA;  b. Lexiscan Myoview (02/2015): mild reversible apical anterior perfusion defect. C. cath 02/2015 50% LAD lesion with patent stent, Tx Rx    . Cardiomyopathy, ischemic 03/17/2015  . Chronic diastolic CHF (congestive heart failure) ([place]) 11/12/2016  . COPD (chronic obstructive pulmonary disease) ([place])   . Coronary atherosclerosis of native coronary artery 04/03/2013  . Dementia ([place])   . Dementia ([place])   . Depression 03/14/2017  . Diverticulosis   . DM2 (diabetes mellitus, type 2) ([place]) 04/02/2013  . Dysphagia 05/10/2017   Minimal  6/20 - pureed diet  . Fibromyalgia 04/20/2008   Qualifier: Diagnosis of  By: Nelson-Smith CMA (AAMA), Dottie    . GERD (gastroesophageal reflux disease)   . Glaucoma   . HTN (hypertension)   . Hyperlipidemia   . Ischemic cardiomyopathy    EF 45%cath 2012 EF55% 5/12; 35% 11/14  . Memory deficit   . Nephrolithiasis   . Obesity (BMI 30-39.9) 10/06/2015  . Osteoarthritis   . Overweight(278.02)   .  Respiratory arrest//ACE Inhibitor presumed cause   . Senile dementia without behavioral disturbance ([place]) 10/06/2015  . Sleep apnea   . Small bowel obstruction (HCC)    from a sigmoid stricture  . Ventricular tachycardia ([place]) 03/17/2015  . Vitamin D deficiency 01/28/2017   Past Surgical History:  Procedure Laterality Date  . bilateral tubal ligation    . CARDIAC CATHETERIZATION N/A 03/18/2015   Procedure: LEFT HEART CATH AND CORONARY ANGIOGRAPHY;  Surgeon: Lorretta Harp, MD;  Location: [facility] CATH LAB;  Service: Cardiovascular;  Laterality: N/A;  . CARDIAC SURGERY    . HIP ARTHROPLASTY Right 11/10/2016   Procedure: ARTHROPLASTY BIPOLAR HIP (HEMIARTHROPLASTY);  Surgeon: Marchia Bond, MD;  Location: [facility];  Service: Orthopedics;  Laterality: Right;  . 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 LITHOTRIPSY      Allergies  Allergen Reactions  . Lisinopril Other (See Comments)    Reaction:  Respiratory arrest   . Spironolactone Anaphylaxis  . Vancomycin Anaphylaxis and Rash  . Donepezil Diarrhea  . Eggs Or Egg-Derived Products Diarrhea  . Erythromycin Other (See Comments)    Reaction:  Makes pt feel weird   . Macrolides And Ketolides Other (See Comments)    Reaction:  Unknown   . Penicillins Itching and Other (See Comments)    Has patient had a PCN reaction causing immediate rash, facial/tongue/throat swelling, SOB or lightheadedness with hypotension: No Has patient had a PCN reaction causing severe rash involving mucus membranes or skin necrosis: No Has patient had a PCN reaction that required hospitalization No Has patient had a PCN reaction occurring within the last 10 years: No If all of the above answers are "NO", then may proceed with Cephalosporin use.  . Quinapril Hcl Itching  . Angiotensin Receptor Blockers Other (See Comments)    Reaction:  Unknown   . Lipitor [Atorvastatin] Palpitations  . Pentazocine Lactate Other (See Comments)    Reaction:   Unknown   . Propoxyphene Hcl Other (See Comments)    Reaction unknown  . Sulfonamide Derivatives Other (See Comments)    Reaction:  Unknown     Allergies as of 10/07/2020      Reactions   Lisinopril Other (See Comments)   Reaction:  Respiratory arrest    Spironolactone Anaphylaxis   Vancomycin Anaphylaxis, Rash   Donepezil Diarrhea   Eggs Or Egg-derived Products Diarrhea   Erythromycin Other (See Comments)   Reaction:  Makes pt feel weird    Macrolides And Ketolides Other (See Comments)   Reaction:  Unknown    Penicillins Itching, Other (See Comments)   Has patient had a PCN reaction causing immediate rash, facial/tongue/throat swelling, SOB or lightheadedness with hypotension: No Has patient had a PCN reaction causing severe rash involving mucus membranes or skin necrosis: No Has patient had a PCN reaction that required hospitalization No Has patient had a PCN reaction occurring within the last 10 years: No If all of the above answers are "NO", then may proceed with Cephalosporin use.  Quinapril Hcl Itching   Angiotensin Receptor Blockers Other (See Comments)   Reaction:  Unknown    Lipitor [atorvastatin] Palpitations   Pentazocine Lactate Other (See Comments)   Reaction:  Unknown    Propoxyphene Hcl Other (See Comments)   Reaction unknown   Sulfonamide Derivatives Other (See Comments)   Reaction:  Unknown       Medication List       Accurate as of October 07, 2020  8:54 AM. If you have any questions, ask your nurse or doctor.        amLODipine 5 MG tablet Commonly known as: NORVASC Take 1 tablet (5 mg total) by mouth daily.   aspirin EC 81 MG tablet Take 81 mg by mouth daily.   atorvastatin 40 MG tablet Commonly known as: LIPITOR Take 40 mg by mouth daily.   bisacodyl 10 MG suppository Commonly known as: DULCOLAX If not relieved by MOM, give 10 mg Bisacodyl suppositiory rectally X 1 dose in 24 hours as needed (Do not use constipation standing orders for  residents with renal failure/CFR less than 30. Contact MD for orders) (Physician Order)   clopidogrel 75 MG tablet Commonly known as: PLAVIX Take 75 mg by mouth daily.   furosemide 20 MG tablet Commonly known as: LASIX Take 20 mg by mouth daily.   isosorbide mononitrate 30 MG 24 hr tablet Commonly known as: IMDUR Take 15 mg by mouth daily. Take 1/2 tablet = 15mg    magnesium hydroxide 400 MG/5ML suspension Commonly known as: MILK OF MAGNESIA If no BM in 3 days, give 30 cc Milk of Magnesium p.o. x 1 dose in 24 hours as needed (Do not use standing constipation orders for residents with renal failure CFR less than 30. Contact MD for orders) (Physician Order)   magnesium oxide 400 MG tablet Commonly known as: MAG-OX Take 400 mg by mouth daily.   memantine 10 MG tablet Commonly known as: NAMENDA Take 10 mg by mouth 2 (two) times daily.   metFORMIN 500 MG tablet Commonly known as: GLUCOPHAGE Take 500 mg by mouth 2 (two) times daily with a meal.   metoprolol tartrate 25 MG tablet Commonly known as: LOPRESSOR Take 12.5 mg by mouth every morning.   mometasone-formoterol 100-5 MCG/ACT Aero Commonly known as: DULERA Inhale 2 puffs into the lungs 2 (two) times daily.   NON FORMULARY DIET: Regular   NUTRITIONAL SUPPLEMENTS PO Take 1 each by mouth See admin instructions. Magic Cup with lunch and dinner meal   Omeprazole 20 MG Tbec Take 20 mg by mouth 2 (two) times daily.   RA SALINE ENEMA RE If not relieved by Biscodyl suppository, give disposable Saline Enema rectally X 1 dose/24 hrs as needed (Do not use constipation standing orders for residents with renal failure/CFR less than 30. Contact MD for orders)(Physician Or   sennosides-docusate sodium 8.6-50 MG tablet Commonly known as: SENOKOT-S Take 2 tablets by mouth daily.   traMADol 50 MG tablet Commonly known as: ULTRAM Take 1 tablet (50 mg total) by mouth daily.   UTI-Stat Liqd Take by mouth. Give 30 MLS by mouth every  day for UTI   Vitamin D3 1.25 MG (50000 UT) Caps Take 1 capsule by mouth once a week.       Review of Systems  Constitutional: Negative for activity change, appetite change and fever.  HENT: Negative for dental problem and trouble swallowing.   Eyes: Negative for visual disturbance.  Respiratory: Negative for cough and shortness of breath.  Cardiovascular: Negative for chest pain and leg swelling.  Gastrointestinal: Negative for abdominal pain and constipation.  Endocrine:       Diabetes  Genitourinary: Negative for dysuria and hematuria.  Musculoskeletal: Positive for arthralgias.  Skin:       Dry skin  Psychiatric/Behavioral: Negative for agitation, hallucinations and sleep disturbance.       Memory loss    Immunization History  Administered Date(s) Administered  . Influenza Split 08/20/2014  . Influenza, High Dose Seasonal PF 10/06/2013  . Influenza-Unspecified 10/17/2017, 08/29/2018, 08/27/2019  . Moderna SARS-COVID-2 Vaccination 11/20/2019, 12/18/2019  . PPD Test 11/16/2016, 12/05/2016  . Pneumococcal Conjugate-13 10/17/2017  . Pneumococcal Polysaccharide-23 09/24/2009  . Tdap 10/18/2017   Pertinent  Health Maintenance Due  Topic Date Due  . OPHTHALMOLOGY EXAM  Never done  . INFLUENZA VACCINE  02/17/2021 (Originally 06/20/2020)  . FOOT EXAM  01/26/2021  . HEMOGLOBIN A1C  02/25/2021  . URINE MICROALBUMIN  08/27/2021  . DEXA SCAN  Completed  . PNA vac Low Risk Adult  Completed   Fall Risk  09/06/2020 04/19/2018  Falls in the past year? - No  Risk for fall due to : History of fall(s);Impaired balance/gait;Impaired mobility;Medication side effect;Mental status change -  Follow up Falls evaluation completed;Education provided;Falls prevention discussed;Follow up appointment -   Functional Status Survey:    Vitals:   10/07/20 0851  BP: 139/73  Pulse: 62  Resp: 20  Temp: (!) 97.5 F (36.4 C)  SpO2: 97%  Weight: 121 lb 3.2 oz (55 kg)  Height: 5\' 4"  (1.626 m)     Body mass index is 20.8 kg/m. Physical Exam Vitals and nursing note reviewed.  Constitutional:      General: She is not in acute distress.    Appearance: Normal appearance. She is normal weight.  HENT:     Head: Normocephalic.     Right Ear: There is no impacted cerumen.     Left Ear: There is no impacted cerumen.     Nose: No congestion.     Mouth/Throat:     Mouth: Mucous membranes are moist.     Pharynx: No posterior oropharyngeal erythema.  Eyes:     Extraocular Movements: Extraocular movements intact.     Pupils: Pupils are equal, round, and reactive to light.  Cardiovascular:     Rate and Rhythm: Normal rate and regular rhythm.     Pulses: Normal pulses.     Heart sounds: Normal heart sounds. No murmur heard.   Pulmonary:     Effort: Pulmonary effort is normal. No respiratory distress.     Breath sounds: Normal breath sounds. No wheezing.  Abdominal:     General: Abdomen is flat. Bowel sounds are normal.     Palpations: Abdomen is soft.  Musculoskeletal:     Right lower leg: No edema.     Left lower leg: No edema.  Feet:     Right foot:     Toenail Condition: Right toenails are abnormally thick. Fungal disease present.    Left foot:     Toenail Condition: Left toenails are abnormally thick. Fungal disease present. Skin:    General: Skin is dry.     Capillary Refill: Capillary refill takes less than 2 seconds.     Comments: Skin intact over pressure points  Neurological:     Mental Status: She is alert. Mental status is at baseline.     Gait: Gait abnormal.     Comments: Uses wheelchair  Psychiatric:  Mood and Affect: Mood normal.     Labs reviewed: Recent Labs    10/14/19 0528 10/15/19 0601 10/19/19 0500 10/19/19 0500 10/20/19 0500 10/20/19 0500 10/21/19 0521 10/27/19 0000 01/28/20 0000 01/28/20 0000 04/08/20 0000 05/18/20 0000 08/27/20 0000  NA 146*   < > 142   < > 141   < > 143   < > 141   < > 145 139 136*  K 4.4   < > 3.5   < > 4.1    < > 3.7   < > 4.6  --   --  4.6 4.4  CL 106   < > 106   < > 104   < > 110   < > 104  --   --  101 96*  CO2 15*   < > 28   < > 24   < > 19*   < > 26*  --   --  21 26*  GLUCOSE 128*   < > 148*  --  126*  --  166*  --   --   --   --   --   --   BUN 104*   < > 9   < > 10   < > 6*   < > 32*   < > 26* 23* 21  CREATININE 6.57*   < > 0.78   < > 0.74   < > 0.75   < > 1.0   < > 0.7 0.8 0.7  CALCIUM 8.5*   < > 7.5*   < > 7.8*   < > 8.4*   < > 9.5   < > 9.9 10.1 9.6  MG 1.7   < > 2.0  --  1.6*  --  1.4*  --   --   --   --   --   --   PHOS 7.5*  --   --   --   --   --   --   --   --   --   --   --   --    < > = values in this interval not displayed.   Recent Labs    10/13/19 2117 10/14/19 0528 08/27/20 0000  AST 29  --  12*  ALT 16  --  11  ALKPHOS 105  --  94  BILITOT 0.6  --   --   PROT 9.1*  --   --   ALBUMIN 3.5 2.6* 3.6   Recent Labs    10/19/19 0500 10/19/19 0500 10/20/19 0500 10/20/19 0500 10/21/19 0521 10/27/19 0000 12/23/19 0000 12/23/19 0000 01/28/20 0000 04/08/20 0000 08/27/20 0000  WBC 11.4*   < > 8.7   < > 6.6   < > 8.0   < > 9.7 10.5 10.3  NEUTROABS  --   --   --   --   --    < > 5  --  6  --  7  HGB 10.3*   < > 10.0*   < > 10.4*   < > 12.7   < > 12.1 12.7 13.3  HCT 31.9*   < > 31.1*   < > 32.9*   < > 37   < > 37 40 40  MCV 98.5  --  99.0  --  99.7  --   --   --   --   --   --   PLT 154   < >  174   < > 202   < > 217   < > 227 222 195   < > = values in this interval not displayed.   Lab Results  Component Value Date   TSH 4.04 08/27/2020   Lab Results  Component Value Date   HGBA1C 8 08/27/2020   Lab Results  Component Value Date   CHOL 137 08/27/2020   HDL 37 08/27/2020   LDLCALC 75 08/27/2020   TRIG 128 08/27/2020   CHOLHDL 2 12/19/2012    Significant Diagnostic Results in last 30 days:  No results found.  Assessment/Plan 1. Vascular dementia without behavioral disturbance (HCC) - stable at this time - her motor skills remain unchanged and she is  still having repetitive speech - no recent hallucinations or agitation reported - continue namenda BID  2. Type 2 diabetes mellitus with other specified complication, without long-term current use of insulin (HCC) - stable at this time, no hypoglycemic events recorded - recommend having staff check blood glucose prior to breakfast - still has not received eye exam- will f/u with facility - may consider increasing metformin if a1C> 8 in April  3. Hypertension associated with type 2 diabetes mellitus ([place]) - bp at goal <150/90 - continue current medication regimen  4. Oropharyngeal dysphagia - stable at this time, no recent aspirations - no issues taking medications  5. Atherosclerosis of native coronary artery of native heart with stable angina pectoris ([place]) - stable at this time - no recent weight gain, vitals stable - continue asa, plavix, imdur, statin, metoprolol regimen  6. Gastroesophageal reflux disease without esophagitis - stable at this time, continue omeprazole BID  7. Onychomycosis of multiple toenails with type 2 diabetes mellitus ([place]) - up to date on foot exam - toenails thick and yellow on exam, trimmed at this time - will consider podiatry consult in future   Family/ staff Communication: plan discussed with facility nurse  Labs/tests ordered:  none

## 2020-10-11 ENCOUNTER — Telehealth: Payer: Self-pay

## 2020-10-11 NOTE — Telephone Encounter (Signed)
Almyra Free with [facility] [employer] called regarding prescription that was sent in for patient. Isosorbide dinitrate was sent in for prior approval, but the isosorbide mononitrate 30 mg take 1/2 tablet daily is on patient's chart. Please advise as to which medication should be filled. Routed message to Hollace Kinnier, DO.

## 2020-10-26 ENCOUNTER — Encounter: Payer: Self-pay | Admitting: Orthopedic Surgery

## 2020-10-26 NOTE — Progress Notes (Signed)
Location:    [facility] Room Number: 662/H Place of Service:  SNF [phone]) Provider:  Windell Moulding NP  Gayland Curry, DO  Patient Care Team: Gayland Curry, DO as PCP - General (Geriatric Medicine)  Extended Emergency Contact Information Primary Emergency Contact: [name],[name] Address: [address]          [place],  [address] [name] of [place] Phone: [phone] Work Phone: [phone] Mobile Phone: [phone] Relation: Daughter Secondary Emergency Contact: [name],[name]  [place] Phone: [phone] Relation: Niece  Code Status:  DNR Goals of care: Advanced Directive information Advanced Directives 10/26/2020  Does Patient Have a Medical Advance Directive? Yes  Type of Paramedic of [place];Living will;Out of facility DNR (pink MOST or yellow form)  Does patient want to make changes to medical advance directive? No - Patient declined  Copy of [place] in Chart? Yes - validated most recent copy scanned in chart (See row information)  Would patient like information on creating a medical advance directive? -  Pre-existing out of facility DNR order (yellow form or pink MOST form) Yellow form placed in chart (order not valid for inpatient use)     Chief Complaint  Patient presents with  . Medical Management of Chronic Issues    Routine Visit of Medical Management    HPI:  Pt is a 79 y.o. female seen today for medical management of chronic diseases.     Past Medical History:  Diagnosis Date  . Allergy to ACE inhibitors 03/17/2015  . Alzheimer disease ([place])   . Anxiety   . CAD (coronary artery disease)    a. s/p inferior STEMI 12/29/10 with rotablator atherectomy RCA 12/31/10 and DES x 2 RCA;  b. Lexiscan Myoview (02/2015): mild reversible apical anterior perfusion defect. C. cath 02/2015 50% LAD lesion with patent stent, Tx Rx    . Cardiomyopathy, ischemic  03/17/2015  . Chronic diastolic CHF (congestive heart failure) ([place]) 11/12/2016  . COPD (chronic obstructive pulmonary disease) ([place])   . Coronary atherosclerosis of native coronary artery 04/03/2013  . Dementia ([place])   . Dementia ([place])   . Depression 03/14/2017  . Diverticulosis   . DM2 (diabetes mellitus, type 2) ([place]) 04/02/2013  . Dysphagia 05/10/2017   Minimal 6/20 - pureed diet  . Fibromyalgia 04/20/2008   Qualifier: Diagnosis of  By: Nelson-Smith CMA (AAMA), Dottie    . GERD (gastroesophageal reflux disease)   . Glaucoma   . HTN (hypertension)   . Hyperlipidemia   . Ischemic cardiomyopathy    EF 45%cath 2012 EF55% 5/12; 35% 11/14  . Memory deficit   . Nephrolithiasis   . Obesity (BMI 30-39.9) 10/06/2015  . Osteoarthritis   . Overweight(278.02)   . Respiratory arrest//ACE Inhibitor presumed cause   . Senile dementia without behavioral disturbance ([place]) 10/06/2015  . Sleep apnea   . Small bowel obstruction ([facility])    from a sigmoid stricture  . Ventricular tachycardia ([place]) 03/17/2015  . Vitamin D deficiency 01/28/2017   Past Surgical History:  Procedure Laterality Date  . bilateral tubal ligation    . CARDIAC CATHETERIZATION N/A 03/18/2015   Procedure: LEFT HEART CATH AND CORONARY ANGIOGRAPHY;  Surgeon: Lorretta Harp, MD;  [facility] C[facility] LAB;  Service: Cardiovascular;  Laterality: N/A;  . CARDIAC SURGERY    . HIP ARTHROPLASTY Right 11/10/2016   Procedure: ARTHROPLASTY BIPOLAR HIP (HEMIARTHROPLASTY);  Surgeon: Marchia Bond, MD;  Location: [facility];  Service: Orthopedics;  Laterality: Right;  . 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 LITHOTRIPSY      Allergies  Allergen Reactions  . Lisinopril Other (See Comments)    Reaction:  Respiratory arrest   . Spironolactone Anaphylaxis  . Vancomycin Anaphylaxis and Rash  . Donepezil Diarrhea  . Eggs Or Egg-Derived Products Diarrhea  . Erythromycin Other (See Comments)    Reaction:  Makes pt feel  weird   . Macrolides And Ketolides Other (See Comments)    Reaction:  Unknown   . Penicillins Itching and Other (See Comments)    Has patient had a PCN reaction causing immediate rash, facial/tongue/throat swelling, SOB or lightheadedness with hypotension: No Has patient had a PCN reaction causing severe rash involving mucus membranes or skin necrosis: No Has patient had a PCN reaction that required hospitalization No Has patient had a PCN reaction occurring within the last 10 years: No If all of the above answers are "NO", then may proceed with Cephalosporin use.  . Quinapril Hcl Itching  . Angiotensin Receptor Blockers Other (See Comments)    Reaction:  Unknown   . Lipitor [Atorvastatin] Palpitations  . Pentazocine Lactate Other (See Comments)    Reaction:  Unknown   . Propoxyphene Hcl Other (See Comments)    Reaction unknown  . Sulfonamide Derivatives Other (See Comments)    Reaction:  Unknown     Allergies as of 10/26/2020      Reactions   Lisinopril Other (See Comments)   Reaction:  Respiratory arrest    Spironolactone Anaphylaxis   Vancomycin Anaphylaxis, Rash   Donepezil Diarrhea   Eggs Or Egg-derived Products Diarrhea   Erythromycin Other (See Comments)   Reaction:  Makes pt feel weird    Macrolides And Ketolides Other (See Comments)   Reaction:  Unknown    Penicillins Itching, Other (See Comments)   Has patient had a PCN reaction causing immediate rash, facial/tongue/throat swelling, SOB or lightheadedness with hypotension: No Has patient had a PCN reaction causing severe rash involving mucus membranes or skin necrosis: No Has patient had a PCN reaction that required hospitalization No Has patient had a PCN reaction occurring within the last 10 years: No If all of the above answers are "NO", then may proceed with Cephalosporin use.   Quinapril Hcl Itching   Angiotensin Receptor Blockers Other (See Comments)   Reaction:  Unknown    Lipitor [atorvastatin] Palpitations     Pentazocine Lactate Other (See Comments)   Reaction:  Unknown    Propoxyphene Hcl Other (See Comments)   Reaction unknown   Sulfonamide Derivatives Other (See Comments)   Reaction:  Unknown       Medication List       Accurate as of October 26, 2020 10:59 AM. If you have any questions, ask your nurse or doctor.        amLODipine 5 MG tablet Commonly known as: NORVASC Take 1 tablet (5 mg total) by mouth daily.   aspirin EC 81 MG tablet Take 81 mg by mouth daily.   atorvastatin 40 MG tablet Commonly known as: LIPITOR Take 40 mg by mouth daily.   bisacodyl 10 MG suppository Commonly known as: DULCOLAX If not relieved by MOM, give 10 mg Bisacodyl suppositiory rectally X 1 dose in 24 hours as needed (Do not use constipation standing orders for residents with renal failure/CFR less than 30. Contact MD for orders) (Physician Order)   clopidogrel 75 MG tablet Commonly known  as: PLAVIX Take 75 mg by mouth daily.   furosemide 20 MG tablet Commonly known as: LASIX Take 20 mg by mouth daily.   isosorbide mononitrate 30 MG 24 hr tablet Commonly known as: IMDUR Take 15 mg by mouth daily. Take 1/2 tablet = 15mg    magnesium hydroxide 400 MG/5ML suspension Commonly known as: MILK OF MAGNESIA If no BM in 3 days, give 30 cc Milk of Magnesium p.o. x 1 dose in 24 hours as needed (Do not use standing constipation orders for residents with renal failure CFR less than 30. Contact MD for orders) (Physician Order)   magnesium oxide 400 MG tablet Commonly known as: MAG-OX Take 400 mg by mouth daily.   memantine 10 MG tablet Commonly known as: NAMENDA Take 10 mg by mouth 2 (two) times daily.   metFORMIN 500 MG tablet Commonly known as: GLUCOPHAGE Take 500 mg by mouth 2 (two) times daily with a meal.   metoprolol tartrate 25 MG tablet Commonly known as: LOPRESSOR Take 12.5 mg by mouth every morning.   mometasone-formoterol 100-5 MCG/ACT Aero Commonly known as: DULERA Inhale 2  puffs into the lungs 2 (two) times daily.   NON FORMULARY DIET: Regular   NUTRITIONAL SUPPLEMENTS PO Take 1 each by mouth See admin instructions. Magic Cup with lunch and dinner meal   Omeprazole 20 MG Tbec Take 20 mg by mouth 2 (two) times daily.   RA SALINE ENEMA RE If not relieved by Biscodyl suppository, give disposable Saline Enema rectally X 1 dose/24 hrs as needed (Do not use constipation standing orders for residents with renal failure/CFR less than 30. Contact MD for orders)(Physician Or   sennosides-docusate sodium 8.6-50 MG tablet Commonly known as: SENOKOT-S Take 2 tablets by mouth daily.   traMADol 50 MG tablet Commonly known as: ULTRAM Take 1 tablet (50 mg total) by mouth daily.   UTI-Stat Liqd Take by mouth. Give 30 MLS by mouth every day for UTI   Vitamin D3 1.25 MG (50000 UT) Caps Take 1 capsule by mouth once a week.       Review of Systems  Immunization History  Administered Date(s) Administered  . Influenza Split 08/20/2014  . Influenza, High Dose Seasonal PF 10/06/2013  . Influenza-Unspecified 10/17/2017, 08/29/2018, 08/27/2019, 09/09/2020  . Moderna SARS-COVID-2 Vaccination 11/20/2019, 12/18/2019, 10/07/2020  . PPD Test 11/16/2016, 12/05/2016  . Pneumococcal Conjugate-13 10/17/2017  . Pneumococcal Polysaccharide-23 09/24/2009  . Tdap 10/18/2017   Pertinent  Health Maintenance Due  Topic Date Due  . OPHTHALMOLOGY EXAM  Never done  . FOOT EXAM  01/26/2021  . HEMOGLOBIN A1C  02/25/2021  . URINE MICROALBUMIN  08/27/2021  . INFLUENZA VACCINE  Completed  . DEXA SCAN  Completed  . PNA vac Low Risk Adult  Completed   Fall Risk  09/06/2020 04/19/2018  Falls in the past year? - No  Risk for fall due to : History of fall(s);Impaired balance/gait;Impaired mobility;Medication side effect;Mental status change -  Follow up Falls evaluation completed;Education provided;Falls prevention discussed;Follow up appointment -   Functional Status Survey:     Vitals:   10/26/20 1039  BP: (!) 101/53  Pulse: 65  Resp: 16  Temp: (!) 97.1 F (36.2 C)  SpO2: 97%  Weight: 115 lb 6.4 oz (52.3 kg)  Height: 5\' 4"  (1.626 m)   Body mass index is 19.81 kg/m. Physical Exam  Labs reviewed: Recent Labs    01/28/20 0000 01/28/20 0000 04/08/20 0000 05/18/20 0000 08/27/20 0000  NA 141   < >  145 139 136*  K 4.6  --   --  4.6 4.4  CL 104  --   --  101 96*  CO2 26*  --   --  21 26*  BUN 32*   < > 26* 23* 21  CREATININE 1.0   < > 0.7 0.8 0.7  CALCIUM 9.5   < > 9.9 10.1 9.6   < > = values in this interval not displayed.   Recent Labs    08/27/20 0000  AST 12*  ALT 11  ALKPHOS 94  ALBUMIN 3.6   Recent Labs    12/23/19 0000 12/23/19 0000 01/28/20 0000 04/08/20 0000 08/27/20 0000  WBC 8.0   < > 9.7 10.5 10.3  NEUTROABS 5  --  6  --  7  HGB 12.7   < > 12.1 12.7 13.3  HCT 37   < > 37 40 40  PLT 217   < > 227 222 195   < > = values in this interval not displayed.   Lab Results  Component Value Date   TSH 4.04 08/27/2020   Lab Results  Component Value Date   HGBA1C 8 08/27/2020   Lab Results  Component Value Date   CHOL 137 08/27/2020   HDL 37 08/27/2020   LDLCALC 75 08/27/2020   TRIG 128 08/27/2020   CHOLHDL 2 12/19/2012    Significant Diagnostic Results in last 30 days:  No results found.  Assessment/Plan There are no diagnoses linked to this encounter.   Family/ staff Communication:   Labs/tests ordered:      This encounter was created in error - please disregard.

## 2020-10-27 ENCOUNTER — Encounter: Payer: Self-pay | Admitting: Orthopedic Surgery

## 2020-10-27 ENCOUNTER — Non-Acute Institutional Stay (SKILLED_NURSING_FACILITY): Payer: Medicare Other | Admitting: Orthopedic Surgery

## 2020-10-27 DIAGNOSIS — K219 Gastro-esophageal reflux disease without esophagitis: Secondary | ICD-10-CM

## 2020-10-27 DIAGNOSIS — J449 Chronic obstructive pulmonary disease, unspecified: Secondary | ICD-10-CM

## 2020-10-27 DIAGNOSIS — E1159 Type 2 diabetes mellitus with other circulatory complications: Secondary | ICD-10-CM | POA: Diagnosis not present

## 2020-10-27 DIAGNOSIS — R1312 Dysphagia, oropharyngeal phase: Secondary | ICD-10-CM

## 2020-10-27 DIAGNOSIS — E1169 Type 2 diabetes mellitus with other specified complication: Secondary | ICD-10-CM | POA: Diagnosis not present

## 2020-10-27 DIAGNOSIS — F0150 Vascular dementia without behavioral disturbance: Secondary | ICD-10-CM | POA: Diagnosis not present

## 2020-10-27 DIAGNOSIS — E785 Hyperlipidemia, unspecified: Secondary | ICD-10-CM

## 2020-10-27 DIAGNOSIS — I25118 Atherosclerotic heart disease of native coronary artery with other forms of angina pectoris: Secondary | ICD-10-CM

## 2020-10-27 DIAGNOSIS — I152 Hypertension secondary to endocrine disorders: Secondary | ICD-10-CM

## 2020-10-27 NOTE — Progress Notes (Signed)
Location:    [facility] Room Number: 503/T Place of Service:  SNF [phone]) Provider: Windell Moulding NP   Gayland Curry, DO  Patient Care Team: Gayland Curry, DO as PCP - General (Geriatric Medicine)  Extended Emergency Contact Information Primary Emergency Contact: [name],[name] Address: [address]          [place], [place] [address] [name] Phone: [phone] Work Phone: [phone] Mobile Phone: [phone] Relation: Daughter Secondary Emergency Contact: [name],[name]  [place] Phone: [phone] Relation: Niece  Code Status: DNR  Goals of care: Advanced Directive information Advanced Directives 10/27/2020  Does Patient Have a Medical Advance Directive? Yes  Type of Paramedic of [place];Living will;Out of facility DNR (pink MOST or yellow form)  Does patient want to make changes to medical advance directive? No - Patient declined  Copy of [place] in Chart? Yes - validated most recent copy scanned in chart (See row information)  Would patient like information on creating a medical advance directive? -  Pre-existing out of facility DNR order (yellow form or pink MOST form) Yellow form placed in chart (order not valid for inpatient use)     Chief Complaint  Patient presents with  . Medical Management of Chronic Issues    Routine Visit of Medical Management   . Quality Metric Gaps    Eye Exam    HPI:  Pt is a 79 y.o. female seen today for medical management of chronic diseases.    She is a resident of [facility], seen in her room today. Past medical history includes: hypertension, cardiomyopathy, ventricular tachycardia, chronic ostructive pulmonary disease, covid pneumonia, dysphagia, GERD, type 2 diabetes mellitus, and vascular dementia without behavioral disturbance.   She is often seen in her wheelchair at the doorway of her  room or hallway. She is alert to self. Cannot express needs or follow commands. When asked a question she uses repetitive phrases as a response. Common responses are "damn it",  "help me" and "no." Facility nurse denies recent behavioral outbursts or increased agitation.   In the past week she has been having her meals in the dinning room with stand by assistance from staff. Averages about 2 meals daily. Still able to feed herself. Tolerating regular diet with thin liquids. She continues to be weighed twice weekly due to CHF. Recent weights ae as follows:  12/3- 115.4 lbs  11/29- 116.4 lbs  11/22- 116.6 lbs  11/18- 117 lbs  Blood glucose continues to average >200. No recent hypoglycemic events. Recent blood glucose readings are as follows:  12/8- 195  12/7- 207, 218  12/6- 207, 104  Blood pressure recording are as follows:  12/7- 103/63  12/6- 101/53  12/5- 110/75  No recent falls or hospitalizations reported.   Facility nurse does not report any concerns at this time. Vitals reviewed prior to encounter.        Past Medical History:  Diagnosis Date  . Allergy to ACE inhibitors 03/17/2015  . Alzheimer disease ([place])   . Anxiety   . CAD (coronary artery disease)    a. s/p inferior STEMI 12/29/10 with rotablator atherectomy RCA 12/31/10 and DES x 2 RCA;  b. Lexiscan Myoview (02/2015): mild reversible apical anterior perfusion defect. C. cath 02/2015 50% LAD lesion with patent stent, Tx Rx    . Cardiomyopathy, ischemic 03/17/2015  . Chronic diastolic CHF (congestive heart failure) ([place]) 11/12/2016  .  COPD (chronic obstructive pulmonary disease) ([place])   . Coronary atherosclerosis of native coronary artery 04/03/2013  . Dementia ([place])   . Dementia ([place])   . Depression 03/14/2017  . Diverticulosis   . DM2 (diabetes mellitus, type 2) ([place]) 04/02/2013  . Dysphagia 05/10/2017   Minimal 6/20 - pureed diet  . Fibromyalgia 04/20/2008   Qualifier: Diagnosis of  By: Nelson-Smith CMA (AAMA), Dottie     . GERD (gastroesophageal reflux disease)   . Glaucoma   . HTN (hypertension)   . Hyperlipidemia   . Ischemic cardiomyopathy    EF 45%cath 2012 EF55% 5/12; 35% 11/14  . Memory deficit   . Nephrolithiasis   . Obesity (BMI 30-39.9) 10/06/2015  . Osteoarthritis   . Overweight(278.02)   . Respiratory arrest//ACE Inhibitor presumed cause   . Senile dementia without behavioral disturbance ([place]) 10/06/2015  . Sleep apnea   . Small bowel obstruction (HCC)    from a sigmoid stricture  . Ventricular tachycardia ([place]) 03/17/2015  . Vitamin D deficiency 01/28/2017   Past Surgical History:  Procedure Laterality Date  . bilateral tubal ligation    . CARDIAC CATHETERIZATION N/A 03/18/2015   Procedure: LEFT HEART CATH AND CORONARY ANGIOGRAPHY;  Surgeon: Lorretta Harp, MD;  Location: [facility] CATH LAB;  Service: Cardiovascular;  Laterality: N/A;  . CARDIAC SURGERY    . HIP ARTHROPLASTY Right 11/10/2016   Procedure: ARTHROPLASTY BIPOLAR HIP (HEMIARTHROPLASTY);  Surgeon: Marchia Bond, MD;  Location: [facility];  Service: Orthopedics;  Laterality: Right;  . 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 LITHOTRIPSY      Allergies  Allergen Reactions  . Lisinopril Other (See Comments)    Reaction:  Respiratory arrest   . Spironolactone Anaphylaxis  . Vancomycin Anaphylaxis and Rash  . Donepezil Diarrhea  . Eggs Or Egg-Derived Products Diarrhea  . Erythromycin Other (See Comments)    Reaction:  Makes pt feel weird   . Macrolides And Ketolides Other (See Comments)    Reaction:  Unknown   . Penicillins Itching and Other (See Comments)    Has patient had a PCN reaction causing immediate rash, facial/tongue/throat swelling, SOB or lightheadedness with hypotension: No Has patient had a PCN reaction causing severe rash involving mucus membranes or skin necrosis: No Has patient had a PCN reaction that required hospitalization No Has patient had a PCN reaction occurring within the  last 10 years: No If all of the above answers are "NO", then may proceed with Cephalosporin use.  . Quinapril Hcl Itching  . Angiotensin Receptor Blockers Other (See Comments)    Reaction:  Unknown   . Lipitor [Atorvastatin] Palpitations  . Pentazocine Lactate Other (See Comments)    Reaction:  Unknown   . Propoxyphene Hcl Other (See Comments)    Reaction unknown  . Sulfonamide Derivatives Other (See Comments)    Reaction:  Unknown     Allergies as of 10/27/2020      Reactions   Lisinopril Other (See Comments)   Reaction:  Respiratory arrest    Spironolactone Anaphylaxis   Vancomycin Anaphylaxis, Rash   Donepezil Diarrhea   Eggs Or Egg-derived Products Diarrhea   Erythromycin Other (See Comments)   Reaction:  Makes pt feel weird    Macrolides And Ketolides Other (See Comments)   Reaction:  Unknown    Penicillins Itching, Other (See Comments)   Has patient had a PCN reaction causing immediate rash, facial/tongue/throat swelling, SOB or lightheadedness with hypotension: No Has patient had a  PCN reaction causing severe rash involving mucus membranes or skin necrosis: No Has patient had a PCN reaction that required hospitalization No Has patient had a PCN reaction occurring within the last 10 years: No If all of the above answers are "NO", then may proceed with Cephalosporin use.   Quinapril Hcl Itching   Angiotensin Receptor Blockers Other (See Comments)   Reaction:  Unknown    Lipitor [atorvastatin] Palpitations   Pentazocine Lactate Other (See Comments)   Reaction:  Unknown    Propoxyphene Hcl Other (See Comments)   Reaction unknown   Sulfonamide Derivatives Other (See Comments)   Reaction:  Unknown       Medication List       Accurate as of October 27, 2020  8:52 AM. If you have any questions, ask your nurse or doctor.        amLODipine 5 MG tablet Commonly known as: NORVASC Take 1 tablet (5 mg total) by mouth daily.   aspirin EC 81 MG tablet Take 81 mg by mouth  daily.   atorvastatin 40 MG tablet Commonly known as: LIPITOR Take 40 mg by mouth daily.   bisacodyl 10 MG suppository Commonly known as: DULCOLAX If not relieved by MOM, give 10 mg Bisacodyl suppositiory rectally X 1 dose in 24 hours as needed (Do not use constipation standing orders for residents with renal failure/CFR less than 30. Contact MD for orders) (Physician Order)   clopidogrel 75 MG tablet Commonly known as: PLAVIX Take 75 mg by mouth daily.   furosemide 20 MG tablet Commonly known as: LASIX Take 20 mg by mouth daily.   isosorbide mononitrate 30 MG 24 hr tablet Commonly known as: IMDUR Take 15 mg by mouth daily. Take 1/2 tablet = 15mg    magnesium hydroxide 400 MG/5ML suspension Commonly known as: MILK OF MAGNESIA If no BM in 3 days, give 30 cc Milk of Magnesium p.o. x 1 dose in 24 hours as needed (Do not use standing constipation orders for residents with renal failure CFR less than 30. Contact MD for orders) (Physician Order)   magnesium oxide 400 MG tablet Commonly known as: MAG-OX Take 400 mg by mouth daily.   memantine 10 MG tablet Commonly known as: NAMENDA Take 10 mg by mouth 2 (two) times daily.   metFORMIN 500 MG tablet Commonly known as: GLUCOPHAGE Take 500 mg by mouth 2 (two) times daily with a meal.   metoprolol tartrate 25 MG tablet Commonly known as: LOPRESSOR Take 12.5 mg by mouth every morning.   mometasone-formoterol 100-5 MCG/ACT Aero Commonly known as: DULERA Inhale 2 puffs into the lungs 2 (two) times daily.   NON FORMULARY DIET: Regular   NUTRITIONAL SUPPLEMENTS PO Take 1 each by mouth See admin instructions. Magic Cup with lunch and dinner meal   Omeprazole 20 MG Tbec Take 20 mg by mouth 2 (two) times daily.   RA SALINE ENEMA RE If not relieved by Biscodyl suppository, give disposable Saline Enema rectally X 1 dose/24 hrs as needed (Do not use constipation standing orders for residents with renal failure/CFR less than 30.  Contact MD for orders)(Physician Or   sennosides-docusate sodium 8.6-50 MG tablet Commonly known as: SENOKOT-S Take 2 tablets by mouth daily.   traMADol 50 MG tablet Commonly known as: ULTRAM Take 1 tablet (50 mg total) by mouth daily.   UTI-Stat Liqd Take by mouth. Give 30 MLS by mouth every day for UTI   Vitamin D3 1.25 MG (50000 UT) Caps Take 1 capsule  by mouth once a week.       Review of Systems  Unable to perform ROS: Dementia    Immunization History  Administered Date(s) Administered  . Influenza Split 08/20/2014  . Influenza, High Dose Seasonal PF 10/06/2013  . Influenza-Unspecified 10/17/2017, 08/29/2018, 08/27/2019, 09/09/2020  . Moderna SARS-COVID-2 Vaccination 11/20/2019, 12/18/2019, 10/07/2020  . PPD Test 11/16/2016, 12/05/2016  . Pneumococcal Conjugate-13 10/17/2017  . Pneumococcal Polysaccharide-23 09/24/2009  . Tdap 10/18/2017   Pertinent  Health Maintenance Due  Topic Date Due  . OPHTHALMOLOGY EXAM  Never done  . FOOT EXAM  01/26/2021  . HEMOGLOBIN A1C  02/25/2021  . URINE MICROALBUMIN  08/27/2021  . INFLUENZA VACCINE  Completed  . DEXA SCAN  Completed  . PNA vac Low Risk Adult  Completed   Fall Risk  09/06/2020 04/19/2018  Falls in the past year? - No  Risk for fall due to : History of fall(s);Impaired balance/gait;Impaired mobility;Medication side effect;Mental status change -  Follow up Falls evaluation completed;Education provided;Falls prevention discussed;Follow up appointment -   Functional Status Survey:    Vitals:   10/27/20 0851  BP: 103/63  Pulse: 69  Resp: 16  Temp: (!) 97.5 F (36.4 C)  SpO2: 97%  Weight: 115 lb 6.4 oz (52.3 kg)  Height: 5\' 4"  (1.626 m)   Body mass index is 19.81 kg/m. Physical Exam Vitals reviewed.  Constitutional:      General: She is not in acute distress.    Appearance: Normal appearance.  HENT:     Right Ear: There is no impacted cerumen.     Left Ear: There is no impacted cerumen.     Nose: No  congestion.     Mouth/Throat:     Mouth: Mucous membranes are moist.     Dentition: Abnormal dentition.     Pharynx: No posterior oropharyngeal erythema.  Eyes:     Extraocular Movements: Extraocular movements intact.     Pupils: Pupils are equal, round, and reactive to light.  Cardiovascular:     Rate and Rhythm: Normal rate and regular rhythm.     Pulses: Normal pulses.     Heart sounds: Normal heart sounds. No murmur heard.   Pulmonary:     Effort: Pulmonary effort is normal. No respiratory distress.     Breath sounds: Normal breath sounds. No wheezing.  Abdominal:     General: Abdomen is flat. Bowel sounds are normal. There is no distension.     Palpations: Abdomen is soft.     Tenderness: There is no abdominal tenderness.  Musculoskeletal:     Cervical back: Normal range of motion.     Right lower leg: No edema.     Left lower leg: No edema.  Lymphadenopathy:     Cervical: No cervical adenopathy.  Skin:    General: Skin is warm and dry.     Capillary Refill: Capillary refill takes less than 2 seconds.     Comments: Skin dry throughout body  Neurological:     Mental Status: She is alert. She is disoriented.     Motor: Weakness present.     Gait: Gait abnormal.     Comments: Uses wheelchair  Psychiatric:        Attention and Perception: Attention normal.        Mood and Affect: Mood normal.        Cognition and Memory: Memory is impaired.     Labs reviewed: Recent Labs    01/28/20 0000 01/28/20 0000 04/08/20  0000 05/18/20 0000 08/27/20 0000  NA 141   < > 145 139 136*  K 4.6  --   --  4.6 4.4  CL 104  --   --  101 96*  CO2 26*  --   --  21 26*  BUN 32*   < > 26* 23* 21  CREATININE 1.0   < > 0.7 0.8 0.7  CALCIUM 9.5   < > 9.9 10.1 9.6   < > = values in this interval not displayed.   Recent Labs    08/27/20 0000  AST 12*  ALT 11  ALKPHOS 94  ALBUMIN 3.6   Recent Labs    12/23/19 0000 12/23/19 0000 01/28/20 0000 04/08/20 0000 08/27/20 0000  WBC  8.0   < > 9.7 10.5 10.3  NEUTROABS 5  --  6  --  7  HGB 12.7   < > 12.1 12.7 13.3  HCT 37   < > 37 40 40  PLT 217   < > 227 222 195   < > = values in this interval not displayed.   Lab Results  Component Value Date   TSH 4.04 08/27/2020   Lab Results  Component Value Date   HGBA1C 8 08/27/2020   Lab Results  Component Value Date   CHOL 137 08/27/2020   HDL 37 08/27/2020   LDLCALC 75 08/27/2020   TRIG 128 08/27/2020   CHOLHDL 2 12/19/2012    Significant Diagnostic Results in last 30 days:  No results found.  Assessment/Plan 1. Vascular dementia without behavioral disturbance (HCC) - stable, no recent outbursts or hallucinations reported - motor skills remain the same, speech still repetitive - continue namenda BID  2. Type 2 diabetes mellitus with other specified complication, without long-term current use of insulin (HCC) -stable, no hypoglycemic events reported - orders for diabetic eye exam placed, will f/u on date - continue metformin 500 mg PO BID  3. Hypertension associated with type 2 diabetes mellitus ([place]) - bp at goal, continue current medication regimen of amlodipine and metoprolol  4. Gastroesophageal reflux disease without esophagitis - stable, continue omeprazole BID  5. Chronic obstructive pulmonary disease, unspecified COPD type ([place]) - stable without oxygen - continue monitoring vitals daily  6. Oropharyngeal dysphagia - no recent aspirations or trouble taking medication - continue regular diet with thin liquids - continue to sit upright when feeding  7. Atherosclerosis of native coronary artery of native heart with stable angina pectoris ([place]) - stable with meds - no recent weight gain, continue weights twice a week - continue medication regimen of asa, plavix, imdur, statin, and metoprolol  8. Hyperlipidemia due to type 2 diabetes mellitus ([place]) - stable with statin - lipid panel in 6 months     Family/ staff Communication: Plan  discussed with patient and facility nurse  Labs/tests ordered:  none

## 2020-10-28 ENCOUNTER — Other Ambulatory Visit: Payer: Self-pay | Admitting: Orthopedic Surgery

## 2020-10-28 DIAGNOSIS — M8949 Other hypertrophic osteoarthropathy, multiple sites: Secondary | ICD-10-CM

## 2020-10-28 DIAGNOSIS — M159 Polyosteoarthritis, unspecified: Secondary | ICD-10-CM

## 2020-10-28 DIAGNOSIS — M797 Fibromyalgia: Secondary | ICD-10-CM

## 2020-10-28 MED ORDER — TRAMADOL HCL 50 MG PO TABS: 50.0000 mg | ORAL_TABLET | Freq: Every day | ORAL | 0 refills | Status: DC

## 2020-11-15 ENCOUNTER — Other Ambulatory Visit: Payer: Self-pay | Admitting: Orthopedic Surgery

## 2020-11-15 DIAGNOSIS — M797 Fibromyalgia: Secondary | ICD-10-CM

## 2020-11-15 DIAGNOSIS — M159 Polyosteoarthritis, unspecified: Secondary | ICD-10-CM

## 2020-11-15 MED ORDER — TRAMADOL HCL 50 MG PO TABS: 50.0000 mg | ORAL_TABLET | Freq: Every day | ORAL | 0 refills | Status: DC

## 2020-12-01 ENCOUNTER — Other Ambulatory Visit: Payer: Self-pay | Admitting: Orthopedic Surgery

## 2020-12-01 DIAGNOSIS — M797 Fibromyalgia: Secondary | ICD-10-CM

## 2020-12-01 DIAGNOSIS — M159 Polyosteoarthritis, unspecified: Secondary | ICD-10-CM

## 2020-12-01 DIAGNOSIS — M8949 Other hypertrophic osteoarthropathy, multiple sites: Secondary | ICD-10-CM

## 2020-12-01 MED ORDER — TRAMADOL HCL 50 MG PO TABS: 50.0000 mg | ORAL_TABLET | Freq: Every day | ORAL | 0 refills | Status: DC

## 2020-12-13 ENCOUNTER — Non-Acute Institutional Stay (SKILLED_NURSING_FACILITY): Payer: Medicare Other | Admitting: Orthopedic Surgery

## 2020-12-13 ENCOUNTER — Encounter: Payer: Self-pay | Admitting: Orthopedic Surgery

## 2020-12-13 ENCOUNTER — Other Ambulatory Visit: Payer: Self-pay | Admitting: Orthopedic Surgery

## 2020-12-13 DIAGNOSIS — M159 Polyosteoarthritis, unspecified: Secondary | ICD-10-CM

## 2020-12-13 DIAGNOSIS — E1159 Type 2 diabetes mellitus with other circulatory complications: Secondary | ICD-10-CM | POA: Diagnosis not present

## 2020-12-13 DIAGNOSIS — K219 Gastro-esophageal reflux disease without esophagitis: Secondary | ICD-10-CM

## 2020-12-13 DIAGNOSIS — M797 Fibromyalgia: Secondary | ICD-10-CM

## 2020-12-13 DIAGNOSIS — I152 Hypertension secondary to endocrine disorders: Secondary | ICD-10-CM

## 2020-12-13 DIAGNOSIS — E1169 Type 2 diabetes mellitus with other specified complication: Secondary | ICD-10-CM

## 2020-12-13 DIAGNOSIS — M8949 Other hypertrophic osteoarthropathy, multiple sites: Secondary | ICD-10-CM

## 2020-12-13 DIAGNOSIS — F0150 Vascular dementia without behavioral disturbance: Secondary | ICD-10-CM

## 2020-12-13 DIAGNOSIS — E785 Hyperlipidemia, unspecified: Secondary | ICD-10-CM

## 2020-12-13 DIAGNOSIS — J449 Chronic obstructive pulmonary disease, unspecified: Secondary | ICD-10-CM

## 2020-12-13 DIAGNOSIS — R1312 Dysphagia, oropharyngeal phase: Secondary | ICD-10-CM

## 2020-12-13 MED ORDER — TRAMADOL HCL 50 MG PO TABS
50.0000 mg | ORAL_TABLET | Freq: Every day | ORAL | 0 refills | Status: DC
Start: 2020-12-13 — End: 2020-12-27

## 2020-12-13 NOTE — Progress Notes (Signed)
Location:  [facility] of Service:  SNF [phone]) Provider: [place] [name], AGNP-C     [name], D.O., C.M.D.  Kathleen Curry, DO  Patient Care Team: Kathleen Curry, DO as PCP - General (Geriatric Medicine)  Extended Emergency Contact Information Primary Emergency Contact: [name],Kathleen Address: [address]          [place], [place] [address] [name] of [place] Phone: [phone] Work Phone: [phone] Mobile Phone: [phone] Relation: Daughter Secondary Emergency Contact: [name],Kathleen  [place] Phone: [phone] Relation: Niece  Code Status:  DNR Goals of care: Advanced Directive information Advanced Directives 10/27/2020  Does Patient Have a Medical Advance Directive? Yes  Type of Paramedic of [place];Living will;Out of facility DNR (pink MOST or yellow form)  Does patient want to make changes to medical advance directive? No - Patient declined  Copy of [place] in Chart? Yes - validated most recent copy scanned in chart (See row information)  Would patient like information on creating a medical advance directive? -  Pre-existing out of facility DNR order (yellow form or pink MOST form) Yellow form placed in chart (order not valid for inpatient use)     Chief Complaint  Patient presents with  . Medical Management of Chronic Issues    Routine Visit     HPI:  Pt is a 80 y.o. female seen today for medical management of chronic diseases.    She is a resident of [facility], seen today in the hallway. PMH includes: cardiomyopathy, hypertension, COPD, dysphagia, GERD, hypothyroidism, type 2 diabetes, vascular dementia without behavioral disturbance, fibromyalgia, hyperlipidemia.  Today, she is sitting in her wheelchair in front of main nurses station. Alert to self only. Follows some commands, cannot express needs. Remains repetitive with  phrases. Common phrases of hers are " damn it" " help me" " don't leave me." BIMS completed 12/29, she scored 3/15. Remains max assistance with transfers and ADLs. Able to feed self. No recent behavioral outburst reported.  No recent falls, injuries or hypoglycemic events reported.   Recent blood pressures are as follows:   01/22- 128/66  01/21- 121/69  01/19- unavailable  01/18- 128/66  Recent weights are as follows:  01/20- 119.4 lbs  12/16- 122 lbs  11/18- 117 lbs  Facility nurse does not report any concerns, vitals stable.    Past Medical History:  Diagnosis Date  . Allergy to ACE inhibitors 03/17/2015  . Alzheimer disease ([place])   . Anxiety   . CAD (coronary artery disease)    a. s/p inferior STEMI 12/29/10 with rotablator atherectomy RCA 12/31/10 and DES x 2 RCA;  b. Lexiscan Myoview (02/2015): mild reversible apical anterior perfusion defect. C. cath 02/2015 50% LAD lesion with patent stent, Tx Rx    . Cardiomyopathy, ischemic 03/17/2015  . Chronic diastolic CHF (congestive heart failure) ([place]) 11/12/2016  . COPD (chronic obstructive pulmonary disease) ([facility])   . Coronary atherosclerosis of native coronary artery 04/03/2013  . Dementia ([place])   . Dementia ([place])   . Depression 03/14/2017  . Diverticulosis   . DM2 (diabetes mellitus, type 2) ([place]) 04/02/2013  . Dysphagia 05/10/2017   Minimal 6/20 - pureed diet  . Fibromyalgia 04/20/2008   Qualifier: Diagnosis of  By: Nelson-Smith CMA (AAMA), Dottie    . GERD (gastroesophageal reflux disease)   . Glaucoma   . HTN (hypertension)   . Hyperlipidemia   .  Ischemic cardiomyopathy    EF 45%cath 2012 EF55% 5/12; 35% 11/14  . Memory deficit   . Nephrolithiasis   . Obesity (BMI 30-39.9) 10/06/2015  . Osteoarthritis   . Overweight(278.02)   . Respiratory arrest//ACE Inhibitor presumed cause   . Senile dementia without behavioral disturbance ([place]) 10/06/2015  . Sleep apnea   . Small bowel obstruction (HCC)    from a sigmoid stricture   . Ventricular tachycardia ([place]) 03/17/2015  . Vitamin D deficiency 01/28/2017   Past Surgical History:  Procedure Laterality Date  . bilateral tubal ligation    . CARDIAC CATHETERIZATION N/A 03/18/2015   Procedure: LEFT HEART CATH AND CORONARY ANGIOGRAPHY;  Surgeon: Lorretta Harp, MD;  Location: [facility] C[facility] LAB;  Service: Cardiovascular;  Laterality: N/A;  . CARDIAC SURGERY    . HIP ARTHROPLASTY Right 11/10/2016   Procedure: ARTHROPLASTY BIPOLAR HIP (HEMIARTHROPLASTY);  Surgeon: Marchia Bond, MD;  Location: [facility];  Service: Orthopedics;  Laterality: Right;  . 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 LITHOTRIPSY      Allergies  Allergen Reactions  . Lisinopril Other (See Comments)    Reaction:  Respiratory arrest   . Spironolactone Anaphylaxis  . Vancomycin Anaphylaxis and Rash  . Donepezil Diarrhea  . Eggs Or Egg-Derived Products Diarrhea  . Erythromycin Other (See Comments)    Reaction:  Makes pt feel weird   . Macrolides And Ketolides Other (See Comments)    Reaction:  Unknown   . Penicillins Itching and Other (See Comments)    Has patient had a PCN reaction causing immediate rash, facial/tongue/throat swelling, SOB or lightheadedness with hypotension: No Has patient had a PCN reaction causing severe rash involving mucus membranes or skin necrosis: No Has patient had a PCN reaction that required hospitalization No Has patient had a PCN reaction occurring within the last 10 years: No If all of the above answers are "NO", then may proceed with Cephalosporin use.  . Quinapril Hcl Itching  . Angiotensin Receptor Blockers Other (See Comments)    Reaction:  Unknown   . Lipitor [Atorvastatin] Palpitations  . Pentazocine Lactate Other (See Comments)    Reaction:  Unknown   . Propoxyphene Hcl Other (See Comments)    Reaction unknown  . Sulfonamide Derivatives Other (See Comments)    Reaction:  Unknown     Outpatient Encounter Medications as of  12/13/2020  Medication Sig  . amLODipine (NORVASC) 5 MG tablet Take 1 tablet (5 mg total) by mouth daily.  Marland Kitchen aspirin EC 81 MG tablet Take 81 mg by mouth daily.  Marland Kitchen atorvastatin (LIPITOR) 40 MG tablet Take 40 mg by mouth daily.   . bisacodyl (DULCOLAX) 10 MG suppository If not relieved by MOM, give 10 mg Bisacodyl suppositiory rectally X 1 dose in 24 hours as needed (Do not use constipation standing orders for residents with renal failure/CFR less than 30. Contact MD for orders) (Physician Order)  . Cholecalciferol (VITAMIN D3) 1.25 MG (50000 UT) CAPS Take 1 capsule by mouth once a week.  . clopidogrel (PLAVIX) 75 MG tablet Take 75 mg by mouth daily.   . Cranberry-Vitamin C-Inulin (UTI-STAT) LIQD Take by mouth. Give 30 MLS by mouth every day for UTI  . furosemide (LASIX) 20 MG tablet Take 20 mg by mouth daily.   . isosorbide mononitrate (IMDUR) 30 MG 24 hr tablet Take 15 mg by mouth daily. Take 1/2 tablet = 15mg   . magnesium hydroxide (MILK OF MAGNESIA) 400 MG/5ML suspension If no  BM in 3 days, give 30 cc Milk of Magnesium p.o. x 1 dose in 24 hours as needed (Do not use standing constipation orders for residents with renal failure CFR less than 30. Contact MD for orders) (Physician Order)  . magnesium oxide (MAG-OX) 400 MG tablet Take 400 mg by mouth daily.  . memantine (NAMENDA) 10 MG tablet Take 10 mg by mouth 2 (two) times daily.   . metFORMIN (GLUCOPHAGE) 500 MG tablet Take 500 mg by mouth 2 (two) times daily with a meal.  . metoprolol tartrate (LOPRESSOR) 25 MG tablet Take 12.5 mg by mouth every morning.  . mometasone-formoterol (DULERA) 100-5 MCG/ACT AERO Inhale 2 puffs into the lungs 2 (two) times daily.   . NON FORMULARY DIET: Regular  . NUTRITIONAL SUPPLEMENTS PO Take 1 each by mouth See admin instructions. Magic Cup with lunch and dinner meal  . Omeprazole 20 MG TBEC Take 20 mg by mouth 2 (two) times daily.   . sennosides-docusate sodium (SENOKOT-S) 8.6-50 MG tablet Take 2 tablets by mouth  daily.  . Sodium Phosphates (RA SALINE ENEMA RE) If not relieved by Biscodyl suppository, give disposable Saline Enema rectally X 1 dose/24 hrs as needed (Do not use constipation standing orders for residents with renal failure/CFR less than 30. Contact MD for orders)(Physician Or  . traMADol (ULTRAM) 50 MG tablet Take 1 tablet (50 mg total) by mouth daily.   No facility-administered encounter medications on file as of 12/13/2020.    Review of Systems  Unable to perform ROS: Dementia    Immunization History  Administered Date(s) Administered  . Influenza Split 08/20/2014  . Influenza, High Dose Seasonal PF 10/06/2013  . Influenza-Unspecified 10/17/2017, 08/29/2018, 08/27/2019, 09/09/2020  . Moderna Sars-Covid-2 Vaccination 11/20/2019, 12/18/2019, 10/07/2020  . PPD Test 11/16/2016, 12/05/2016  . Pneumococcal Conjugate-13 10/17/2017  . Pneumococcal Polysaccharide-23 09/24/2009  . Tdap 10/18/2017   Pertinent  Health Maintenance Due  Topic Date Due  . OPHTHALMOLOGY EXAM  Never done  . FOOT EXAM  01/26/2021  . HEMOGLOBIN A1C  02/25/2021  . URINE MICROALBUMIN  08/27/2021  . INFLUENZA VACCINE  Completed  . DEXA SCAN  Completed  . PNA vac Low Risk Adult  Completed   Fall Risk  09/06/2020 04/19/2018  Falls in the past year? - No  Risk for fall due to : History of fall(s);Impaired balance/gait;Impaired mobility;Medication side effect;Mental status change -  Follow up Falls evaluation completed;Education provided;Falls prevention discussed;Follow up appointment -   Functional Status Survey:    There were no vitals filed for this visit. There is no height or weight on file to calculate BMI. Physical Exam Vitals reviewed.  Constitutional:      General: She is not in acute distress. HENT:     Head: Normocephalic.     Right Ear: There is no impacted cerumen.     Left Ear: There is no impacted cerumen.     Nose: Nose normal.     Mouth/Throat:     Mouth: Mucous membranes are moist.   Eyes:     General:        Right eye: No discharge.        Left eye: No discharge.  Cardiovascular:     Rate and Rhythm: Normal rate and regular rhythm.     Pulses: Normal pulses.     Heart sounds: Normal heart sounds. No murmur heard.   Pulmonary:     Effort: Pulmonary effort is normal. No respiratory distress.     Breath sounds:  No wheezing.  Abdominal:     General: Abdomen is flat. Bowel sounds are normal. There is no distension.     Palpations: Abdomen is soft.     Tenderness: There is no abdominal tenderness.  Musculoskeletal:     Cervical back: Normal range of motion.     Right lower leg: No edema.     Left lower leg: No edema.  Lymphadenopathy:     Cervical: No cervical adenopathy.  Skin:    General: Skin is warm and dry.     Capillary Refill: Capillary refill takes less than 2 seconds.  Neurological:     General: No focal deficit present.     Mental Status: She is alert. Mental status is at baseline.     Motor: Weakness present.     Gait: Gait abnormal.     Comments: Wheelchair  Psychiatric:        Attention and Perception: Attention normal.        Mood and Affect: Affect is flat.        Cognition and Memory: Memory is impaired.     Labs reviewed: Recent Labs    01/28/20 0000 04/08/20 0000 05/18/20 0000 08/27/20 0000  NA 141 145 139 136*  K 4.6  --  4.6 4.4  CL 104  --  101 96*  CO2 26*  --  21 26*  BUN 32* 26* 23* 21  CREATININE 1.0 0.7 0.8 0.7  CALCIUM 9.5 9.9 10.1 9.6   Recent Labs    08/27/20 0000  AST 12*  ALT 11  ALKPHOS 94  ALBUMIN 3.6   Recent Labs    12/23/19 0000 01/28/20 0000 04/08/20 0000 08/27/20 0000  WBC 8.0 9.7 10.5 10.3  NEUTROABS 5 6  --  7  HGB 12.7 12.1 12.7 13.3  HCT 37 37 40 40  PLT 217 227 222 195   Lab Results  Component Value Date   TSH 4.04 08/27/2020   Lab Results  Component Value Date   HGBA1C 8 08/27/2020   Lab Results  Component Value Date   CHOL 137 08/27/2020   HDL 37 08/27/2020   LDLCALC 75  08/27/2020   TRIG 128 08/27/2020   CHOLHDL 2 12/19/2012    Significant Diagnostic Results in last 30 days:  No results found.  Assessment/Plan 1. Vascular dementia without behavioral disturbance (HCC) - stable no recent outbursts,still able to feed self - cont namenda  2. Type 2 diabetes mellitus with other specified complication, without long-term current use of insulin ([place]) - no hypoglycemic events reported - diabetic eye exam recommended last month, will f/u with nurse - cont metformin 500 mg po bid  3. Hypertension associated with type 2 diabetes mellitus ([place]) - bp at goal< 150/90 - cont amlodipine and metoprolol   4. Gastroesophageal reflux disease without esophagitis - stable with omeprazole BID  5. Chronic obstructive pulmonary disease, unspecified COPD type ([place]) - stable without oxygen  6. Oropharyngeal dysphagia - no recent aspirations reported - cont regular diet with thin liquids  7. Hyperlipidemia due to type 2 diabetes mellitus ([place]) - stable with statin  8. Primary osteoarthritis involving multiple joints - stable with daily tramadol    Family/ staff Communication: Plan discussed with patient and facility nurse  Labs/tests ordered: none   [name], AGNP-C [facility] [address], [place] [address] Cell Phone (Mon-Fri 8am-5pm):  [phone] On Call:  [phone] & follow prompts after 5pm & weekends  Office Phone:  [phone] Office Fax:  [phone]

## 2020-12-27 ENCOUNTER — Other Ambulatory Visit: Payer: Self-pay | Admitting: Orthopedic Surgery

## 2020-12-27 DIAGNOSIS — M797 Fibromyalgia: Secondary | ICD-10-CM

## 2020-12-27 DIAGNOSIS — M159 Polyosteoarthritis, unspecified: Secondary | ICD-10-CM

## 2020-12-27 DIAGNOSIS — M8949 Other hypertrophic osteoarthropathy, multiple sites: Secondary | ICD-10-CM

## 2020-12-27 MED ORDER — TRAMADOL HCL 50 MG PO TABS: 50.0000 mg | ORAL_TABLET | Freq: Every day | ORAL | 0 refills | Status: DC

## 2020-12-29 ENCOUNTER — Non-Acute Institutional Stay (SKILLED_NURSING_FACILITY): Payer: Medicare Other | Admitting: Orthopedic Surgery

## 2020-12-29 ENCOUNTER — Encounter: Payer: Self-pay | Admitting: Orthopedic Surgery

## 2020-12-29 DIAGNOSIS — E1169 Type 2 diabetes mellitus with other specified complication: Secondary | ICD-10-CM | POA: Diagnosis not present

## 2020-12-29 NOTE — Progress Notes (Signed)
Location:    [facility] Room Number: 536/R Place of Service:  SNF [phone]) Provider: Windell Moulding NP   Gayland Curry, DO  Patient Care Team: Gayland Curry, DO as PCP - General (Geriatric Medicine)  Extended Emergency Contact Information Primary Emergency Contact: [name],[name] Address: [address]          [place], [place] [address] [name] of [place] Phone: [phone] Work Phone: [phone] Mobile Phone: [phone] Relation: Daughter Secondary Emergency Contact: [name],[name]  [place] Phone: [phone] Relation: Niece  Code Status: DNR  Goals of care: Advanced Directive information Advanced Directives 12/29/2020  Does Patient Have a Medical Advance Directive? Yes  Type of Paramedic of [place];Living will;Out of facility DNR (pink MOST or yellow form)  Does patient want to make changes to medical advance directive? No - Patient declined  Copy of [place] in Chart? Yes - validated most recent copy scanned in chart (See row information)  Would patient like information on creating a medical advance directive? -  Pre-existing out of facility DNR order (yellow form or pink MOST form) Yellow form placed in chart (order not valid for inpatient use)     Chief Complaint  Patient presents with  . Acute Visit    Hyperglycemia     HPI:  Pt is a 80 y.o. female seen today for an acute visit for hyperglycemia.   Facility nurse reports elevated blood sugars prior to dinner. Yesterday her blood sugar was 412. Last A1C 8.0 in November 2021. Appetite healthy, staff reports she eats 2 -3 meals a day> 50%. Current weight 120 lbs, 4 lbs more than in November. No recent events of hypoglycemia.   Recent blood sugars are as follows:  02/09- 197  02/08- 194, 412  02/07- 128, 218  02/06- 108, [place] does not report any other concerns, vitals stable.       Past  Medical History:  Diagnosis Date  . Allergy to ACE inhibitors 03/17/2015  . Alzheimer disease ([place])   . Anxiety   . CAD (coronary artery disease)    a. s/p inferior STEMI 12/29/10 with rotablator atherectomy RCA 12/31/10 and DES x 2 RCA;  b. Lexiscan Myoview (02/2015): mild reversible apical anterior perfusion defect. C. cath 02/2015 50% LAD lesion with patent stent, Tx Rx    . Cardiomyopathy, ischemic 03/17/2015  . Chronic diastolic CHF (congestive heart failure) ([place]) 11/12/2016  . COPD (chronic obstructive pulmonary disease) ([place])   . Coronary atherosclerosis of native coronary artery 04/03/2013  . Dementia ([place])   . Dementia ([place])   . Depression 03/14/2017  . Diverticulosis   . DM2 (diabetes mellitus, type 2) ([place]) 04/02/2013  . Dysphagia 05/10/2017   Minimal 6/20 - pureed diet  . Fibromyalgia 04/20/2008   Qualifier: Diagnosis of  By: Nelson-Smith CMA (AAMA), Dottie    . GERD (gastroesophageal reflux disease)   . Glaucoma   . HTN (hypertension)   . Hyperlipidemia   . Ischemic cardiomyopathy    EF 45%cath 2012 EF55% 5/12; 35% 11/14  . Memory deficit   . Nephrolithiasis   . Obesity (BMI 30-39.9) 10/06/2015  . Osteoarthritis   . Overweight(278.02)   . Respiratory arrest//ACE Inhibitor presumed cause   . Senile dementia without behavioral disturbance ([place]) 10/06/2015  . Sleep apnea   . Small bowel obstruction (HCC)    from a sigmoid stricture  . Ventricular tachycardia ([place]) 03/17/2015  . Vitamin  D deficiency 01/28/2017   Past Surgical History:  Procedure Laterality Date  . bilateral tubal ligation    . CARDIAC CATHETERIZATION N/A 03/18/2015   Procedure: LEFT HEART CATH AND CORONARY ANGIOGRAPHY;  Surgeon: Lorretta Harp, MD;  Location[name] C[facility] LAB;  Service: Cardiovascular;  Laterality: N/A;  . CARDIAC SURGERY    . HIP ARTHROPLASTY Right 11/10/2016   Procedure: ARTHROPLASTY BIPOLAR HIP (HEMIARTHROPLASTY);  Surgeon: Marchia Bond, MD;  Location: [facility];  Service: Orthopedics;   Laterality: Right;  . 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 LITHOTRIPSY      Allergies  Allergen Reactions  . Lisinopril Other (See Comments)    Reaction:  Respiratory arrest   . Spironolactone Anaphylaxis  . Vancomycin Anaphylaxis and Rash  . Donepezil Diarrhea  . Eggs Or Egg-Derived Products Diarrhea  . Erythromycin Other (See Comments)    Reaction:  Makes pt feel weird   . Macrolides And Ketolides Other (See Comments)    Reaction:  Unknown   . Penicillins Itching and Other (See Comments)    Has patient had a PCN reaction causing immediate rash, facial/tongue/throat swelling, SOB or lightheadedness with hypotension: No Has patient had a PCN reaction causing severe rash involving mucus membranes or skin necrosis: No Has patient had a PCN reaction that required hospitalization No Has patient had a PCN reaction occurring within the last 10 years: No If all of the above answers are "NO", then may proceed with Cephalosporin use.  . Quinapril Hcl Itching  . Angiotensin Receptor Blockers Other (See Comments)    Reaction:  Unknown   . Lipitor [Atorvastatin] Palpitations  . Pentazocine Lactate Other (See Comments)    Reaction:  Unknown   . Propoxyphene Hcl Other (See Comments)    Reaction unknown  . Sulfonamide Derivatives Other (See Comments)    Reaction:  Unknown     Allergies as of 12/29/2020      Reactions   Lisinopril Other (See Comments)   Reaction:  Respiratory arrest    Spironolactone Anaphylaxis   Vancomycin Anaphylaxis, Rash   Donepezil Diarrhea   Eggs Or Egg-derived Products Diarrhea   Erythromycin Other (See Comments)   Reaction:  Makes pt feel weird    Macrolides And Ketolides Other (See Comments)   Reaction:  Unknown    Penicillins Itching, Other (See Comments)   Has patient had a PCN reaction causing immediate rash, facial/tongue/throat swelling, SOB or lightheadedness with hypotension: No Has patient had a PCN reaction causing  severe rash involving mucus membranes or skin necrosis: No Has patient had a PCN reaction that required hospitalization No Has patient had a PCN reaction occurring within the last 10 years: No If all of the above answers are "NO", then may proceed with Cephalosporin use.   Quinapril Hcl Itching   Angiotensin Receptor Blockers Other (See Comments)   Reaction:  Unknown    Lipitor [atorvastatin] Palpitations   Pentazocine Lactate Other (See Comments)   Reaction:  Unknown    Propoxyphene Hcl Other (See Comments)   Reaction unknown   Sulfonamide Derivatives Other (See Comments)   Reaction:  Unknown       Medication List       Accurate as of December 29, 2020 12:08 PM. If you have any questions, ask your nurse or doctor.        amLODipine 5 MG tablet Commonly known as: NORVASC Take 1 tablet (5 mg total) by mouth daily.   aspirin EC 81 MG tablet  Take 81 mg by mouth daily.   atorvastatin 40 MG tablet Commonly known as: LIPITOR Take 40 mg by mouth daily.   bisacodyl 10 MG suppository Commonly known as: DULCOLAX If not relieved by MOM, give 10 mg Bisacodyl suppositiory rectally X 1 dose in 24 hours as needed (Do not use constipation standing orders for residents with renal failure/CFR less than 30. Contact MD for orders) (Physician Order)   clopidogrel 75 MG tablet Commonly known as: PLAVIX Take 75 mg by mouth daily.   furosemide 20 MG tablet Commonly known as: LASIX Take 20 mg by mouth daily.   isosorbide mononitrate 30 MG 24 hr tablet Commonly known as: IMDUR Take 15 mg by mouth daily. Take 1/2 tablet = 15mg    magnesium hydroxide 400 MG/5ML suspension Commonly known as: MILK OF MAGNESIA If no BM in 3 days, give 30 cc Milk of Magnesium p.o. x 1 dose in 24 hours as needed (Do not use standing constipation orders for residents with renal failure CFR less than 30. Contact MD for orders) (Physician Order)   magnesium oxide 400 MG tablet Commonly known as: MAG-OX Take 400 mg  by mouth daily.   memantine 10 MG tablet Commonly known as: NAMENDA Take 10 mg by mouth 2 (two) times daily.   metFORMIN 500 MG tablet Commonly known as: GLUCOPHAGE Take 500 mg by mouth 2 (two) times daily with a meal.   metoprolol tartrate 25 MG tablet Commonly known as: LOPRESSOR Take 12.5 mg by mouth every morning.   mometasone-formoterol 100-5 MCG/ACT Aero Commonly known as: DULERA Inhale 2 puffs into the lungs 2 (two) times daily.   NON FORMULARY DIET: Regular   NUTRITIONAL SUPPLEMENTS PO Take 1 each by mouth See admin instructions. Magic Cup with lunch and dinner meal   Omeprazole 20 MG Tbec Take 20 mg by mouth 2 (two) times daily.   RA SALINE ENEMA RE If not relieved by Biscodyl suppository, give disposable Saline Enema rectally X 1 dose/24 hrs as needed (Do not use constipation standing orders for residents with renal failure/CFR less than 30. Contact MD for orders)(Physician Or   sennosides-docusate sodium 8.6-50 MG tablet Commonly known as: SENOKOT-S Take 2 tablets by mouth daily.   traMADol 50 MG tablet Commonly known as: ULTRAM Take 1 tablet (50 mg total) by mouth daily.   UTI-Stat Liqd Take by mouth. Give 30 MLS by mouth every day for UTI   Vitamin D3 1.25 MG (50000 UT) Caps Take 1 capsule by mouth once a week.       Review of Systems  Unable to perform ROS: Dementia    Immunization History  Administered Date(s) Administered  . Influenza Split 08/20/2014  . Influenza, High Dose Seasonal PF 10/06/2013  . Influenza-Unspecified 10/17/2017, 08/29/2018, 08/27/2019, 09/09/2020  . Moderna Sars-Covid-2 Vaccination 11/20/2019, 12/18/2019, 10/07/2020  . PPD Test 11/16/2016, 12/05/2016  . Pneumococcal Conjugate-13 10/17/2017  . Pneumococcal Polysaccharide-23 09/24/2009  . Tdap 10/18/2017   Pertinent  Health Maintenance Due  Topic Date Due  . OPHTHALMOLOGY EXAM  Never done  . FOOT EXAM  01/26/2021  . HEMOGLOBIN A1C  02/25/2021  . URINE MICROALBUMIN   08/27/2021  . INFLUENZA VACCINE  Completed  . DEXA SCAN  Completed  . PNA vac Low Risk Adult  Completed   Fall Risk  09/06/2020 04/19/2018  Falls in the past year? - No  Risk for fall due to : History of fall(s);Impaired balance/gait;Impaired mobility;Medication side effect;Mental status change -  Follow up Falls evaluation completed;Education provided;Falls  prevention discussed;Follow up appointment -   Functional Status Survey:    Vitals:   12/29/20 1140  BP: 138/86  Pulse: 66  Resp: 18  Temp: (!) 96.9 F (36.1 C)  SpO2: 97%  Weight: 120 lb 4.8 oz (54.6 kg)  Height: 5\' 4"  (1.626 m)   Body mass index is 20.65 kg/m. Physical Exam Constitutional:      General: She is not in acute distress. HENT:     Head: Normocephalic.  Cardiovascular:     Rate and Rhythm: Normal rate and regular rhythm.     Pulses: Normal pulses.     Heart sounds: Normal heart sounds. No murmur heard.   Pulmonary:     Effort: Pulmonary effort is normal. No respiratory distress.     Breath sounds: Normal breath sounds. No wheezing.  Neurological:     General: No focal deficit present.     Mental Status: She is alert. Mental status is at baseline.     Motor: Weakness present.     Gait: Gait abnormal.  Psychiatric:        Mood and Affect: Mood normal.        Behavior: Behavior normal.        Cognition and Memory: Memory is impaired.     Labs reviewed: Recent Labs    01/28/20 0000 04/08/20 0000 05/18/20 0000 08/27/20 0000  NA 141 145 139 136*  K 4.6  --  4.6 4.4  CL 104  --  101 96*  CO2 26*  --  21 26*  BUN 32* 26* 23* 21  CREATININE 1.0 0.7 0.8 0.7  CALCIUM 9.5 9.9 10.1 9.6   Recent Labs    08/27/20 0000  AST 12*  ALT 11  ALKPHOS 94  ALBUMIN 3.6   Recent Labs    01/28/20 0000 04/08/20 0000 08/27/20 0000  WBC 9.7 10.5 10.3  NEUTROABS 6  --  7  HGB 12.1 12.7 13.3  HCT 37 40 40  PLT 227 222 195   Lab Results  Component Value Date   TSH 4.04 08/27/2020   Lab Results   Component Value Date   HGBA1C 8 08/27/2020   Lab Results  Component Value Date   CHOL 137 08/27/2020   HDL 37 08/27/2020   LDLCALC 75 08/27/2020   TRIG 128 08/27/2020   CHOLHDL 2 12/19/2012    Significant Diagnostic Results in last 30 days:  No results found.  Assessment/Plan 1. Type 2 diabetes mellitus with other specified complication, without long-term current use of insulin (HCC) - AM sugars average 100-200, evening sugar 200-400 - will increase AM metformin dose to 750 mg and continue 500 mg in evening - A1C scheduled to be drawn 02/26 - continue carb mod diet    Family/ staff Communication: Plan discussed with facility nurse  Labs/tests ordered: none

## 2021-01-07 ENCOUNTER — Encounter: Payer: Self-pay | Admitting: Orthopedic Surgery

## 2021-01-07 ENCOUNTER — Non-Acute Institutional Stay (SKILLED_NURSING_FACILITY): Payer: Medicare Other | Admitting: Orthopedic Surgery

## 2021-01-07 DIAGNOSIS — E1159 Type 2 diabetes mellitus with other circulatory complications: Secondary | ICD-10-CM

## 2021-01-07 DIAGNOSIS — E785 Hyperlipidemia, unspecified: Secondary | ICD-10-CM

## 2021-01-07 DIAGNOSIS — E1169 Type 2 diabetes mellitus with other specified complication: Secondary | ICD-10-CM

## 2021-01-07 DIAGNOSIS — F0150 Vascular dementia without behavioral disturbance: Secondary | ICD-10-CM | POA: Diagnosis not present

## 2021-01-07 DIAGNOSIS — K219 Gastro-esophageal reflux disease without esophagitis: Secondary | ICD-10-CM

## 2021-01-07 DIAGNOSIS — M8949 Other hypertrophic osteoarthropathy, multiple sites: Secondary | ICD-10-CM

## 2021-01-07 DIAGNOSIS — I152 Hypertension secondary to endocrine disorders: Secondary | ICD-10-CM

## 2021-01-07 DIAGNOSIS — R1312 Dysphagia, oropharyngeal phase: Secondary | ICD-10-CM

## 2021-01-07 DIAGNOSIS — M159 Polyosteoarthritis, unspecified: Secondary | ICD-10-CM

## 2021-01-07 DIAGNOSIS — J449 Chronic obstructive pulmonary disease, unspecified: Secondary | ICD-10-CM

## 2021-01-07 NOTE — Progress Notes (Signed)
Location:    [facility] Room Number: 803/O Place of Service:  SNF [phone]) Provider: Windell Moulding NP   Gayland Curry, DO  Patient Care Team: Gayland Curry, DO as PCP - General (Geriatric Medicine)  Extended Emergency Contact Information Primary Emergency Contact: [name],[name] Address: [address]          [place], [place] [address] [name] of [place] Phone: [phone] Work Phone: [phone] Mobile Phone: [phone] Relation: Daughter Secondary Emergency Contact: [name],[name]  [place] Phone: [phone] Relation: Niece  Code Status: DNR  Goals of care: Advanced Directive information Advanced Directives 01/07/2021  Does Patient Have a Medical Advance Directive? Yes  Type of Paramedic of [place];Living will;Out of facility DNR (pink MOST or yellow form)  Does patient want to make changes to medical advance directive? No - Patient declined  Copy of [place] in Chart? Yes - validated most recent copy scanned in chart (See row information)  Would patient like information on creating a medical advance directive? -  Pre-existing out of facility DNR order (yellow form or pink MOST form) Yellow form placed in chart (order not valid for inpatient use)     Chief Complaint  Patient presents with  . Medical Management of Chronic Issues    Routine Visit of Medical Management   . Quality Metric Gaps    Discuss need for Diabetic Eye Exam    HPI:  Pt is a 80 y.o. female seen today for medical management of chronic diseases.    She is a resident of [facility], seen today in the hallway. PMH includes: cardiomyopathy, hypertension, COPD, dysphagia, GERD, hypothyroidism, type 2 diabetes, vascular dementia without behavioral disturbance, fibromyalgia, hyperlipidemia.  Today, she is sitting in her wheelchair in her room. Alert to self only. Does not follow  commands or express needs. Continues to repeat phrases like " damn it" or " help me." In the past few weeks her blood sugars have remained high. Her AM metformin was increased to 750 mg. No recent hypoglycemic events.Blood sugars ranging 100-170 in AM, and 170-300 in PM   No recent falls, injuries or behavioral outbursts.   Recent blood pressures are as follows:  02/17- 143/65  02/16- 119/65  02/15- 126/86  Recent weights are as follows:   02/17- 119.2 lbs  01/20- 119.4 lbs  12/16= 122 lbs  Facility nurse does not report any concerns, vitals stable.    Past Medical History:  Diagnosis Date  . Allergy to ACE inhibitors 03/17/2015  . Alzheimer disease ([place])   . Anxiety   . CAD (coronary artery disease)    a. s/p inferior STEMI 12/29/10 with rotablator atherectomy RCA 12/31/10 and DES x 2 RCA;  b. Lexiscan Myoview (02/2015): mild reversible apical anterior perfusion defect. C. cath 02/2015 50% LAD lesion with patent stent, Tx Rx    . Cardiomyopathy, ischemic 03/17/2015  . Chronic diastolic CHF (congestive heart failure) ([place]) 11/12/2016  . COPD (chronic obstructive pulmonary disease) ([place])   . Coronary atherosclerosis of native coronary artery 04/03/2013  . Dementia ([place])   . Dementia ([place])   . Depression 03/14/2017  . Diverticulosis   . DM2 (diabetes mellitus, type 2) ([place]) 04/02/2013  . Dysphagia 05/10/2017   Minimal 6/20 - pureed diet  . Fibromyalgia 04/20/2008   Qualifier: Diagnosis of  By: Nelson-Smith CMA (AAMA), Dottie    . GERD (gastroesophageal reflux disease)   . Glaucoma   .  HTN (hypertension)   . Hyperlipidemia   . Ischemic cardiomyopathy    EF 45%cath 2012 EF55% 5/12; 35% 11/14  . Memory deficit   . Nephrolithiasis   . Obesity (BMI 30-39.9) 10/06/2015  . Osteoarthritis   . Overweight(278.02)   . Respiratory arrest//ACE Inhibitor presumed cause   . Senile dementia without behavioral disturbance ([place]) 10/06/2015  . Sleep apnea   . Small bowel obstruction (HCC)    from  a sigmoid stricture  . Ventricular tachycardia ([place]) 03/17/2015  . Vitamin D deficiency 01/28/2017   Past Surgical History:  Procedure Laterality Date  . bilateral tubal ligation    . CARDIAC CATHETERIZATION N/A 03/18/2015   Procedure: LEFT HEART CATH AND CORONARY ANGIOGRAPHY;  Surgeon: Lorretta Harp, MD;  Location: [facility] CATH LAB;  Service: Cardiovascular;  Laterality: N/A;  . CARDIAC SURGERY    . HIP ARTHROPLASTY Right 11/10/2016   Procedure: ARTHROPLASTY BIPOLAR HIP (HEMIARTHROPLASTY);  Surgeon: Marchia Bond, MD;  Location: [facility];  Service: Orthopedics;  Laterality: Right;  . 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 LITHOTRIPSY      Allergies  Allergen Reactions  . Lisinopril Other (See Comments)    Reaction:  Respiratory arrest   . Spironolactone Anaphylaxis  . Vancomycin Anaphylaxis and Rash  . Donepezil Diarrhea  . Eggs Or Egg-Derived Products Diarrhea  . Erythromycin Other (See Comments)    Reaction:  Makes pt feel weird   . Macrolides And Ketolides Other (See Comments)    Reaction:  Unknown   . Penicillins Itching and Other (See Comments)    Has patient had a PCN reaction causing immediate rash, facial/tongue/throat swelling, SOB or lightheadedness with hypotension: No Has patient had a PCN reaction causing severe rash involving mucus membranes or skin necrosis: No Has patient had a PCN reaction that required hospitalization No Has patient had a PCN reaction occurring within the last 10 years: No If all of the above answers are "NO", then may proceed with Cephalosporin use.  . Quinapril Hcl Itching  . Angiotensin Receptor Blockers Other (See Comments)    Reaction:  Unknown   . Lipitor [Atorvastatin] Palpitations  . Pentazocine Lactate Other (See Comments)    Reaction:  Unknown   . Propoxyphene Hcl Other (See Comments)    Reaction unknown  . Sulfonamide Derivatives Other (See Comments)    Reaction:  Unknown     Allergies as of 01/07/2021       Reactions   Lisinopril Other (See Comments)   Reaction:  Respiratory arrest    Spironolactone Anaphylaxis   Vancomycin Anaphylaxis, Rash   Donepezil Diarrhea   Eggs Or Egg-derived Products Diarrhea   Erythromycin Other (See Comments)   Reaction:  Makes pt feel weird    Macrolides And Ketolides Other (See Comments)   Reaction:  Unknown    Penicillins Itching, Other (See Comments)   Has patient had a PCN reaction causing immediate rash, facial/tongue/throat swelling, SOB or lightheadedness with hypotension: No Has patient had a PCN reaction causing severe rash involving mucus membranes or skin necrosis: No Has patient had a PCN reaction that required hospitalization No Has patient had a PCN reaction occurring within the last 10 years: No If all of the above answers are "NO", then may proceed with Cephalosporin use.   Quinapril Hcl Itching   Angiotensin Receptor Blockers Other (See Comments)   Reaction:  Unknown    Lipitor [atorvastatin] Palpitations   Pentazocine Lactate Other (See Comments)   Reaction:  Unknown    Propoxyphene Hcl Other (See Comments)   Reaction unknown   Sulfonamide Derivatives Other (See Comments)   Reaction:  Unknown       Medication List       Accurate as of January 07, 2021  9:11 AM. If you have any questions, ask your nurse or doctor.        amLODipine 5 MG tablet Commonly known as: NORVASC Take 1 tablet (5 mg total) by mouth daily.   aspirin EC 81 MG tablet Take 81 mg by mouth daily.   atorvastatin 40 MG tablet Commonly known as: LIPITOR Take 40 mg by mouth daily.   bisacodyl 10 MG suppository Commonly known as: DULCOLAX If not relieved by MOM, give 10 mg Bisacodyl suppositiory rectally X 1 dose in 24 hours as needed (Do not use constipation standing orders for residents with renal failure/CFR less than 30. Contact MD for orders) (Physician Order)   clopidogrel 75 MG tablet Commonly known as: PLAVIX Take 75 mg by mouth daily.    furosemide 20 MG tablet Commonly known as: LASIX Take 20 mg by mouth daily.   isosorbide mononitrate 30 MG 24 hr tablet Commonly known as: IMDUR Take 15 mg by mouth daily. Take 1/2 tablet = 15mg    magnesium hydroxide 400 MG/5ML suspension Commonly known as: MILK OF MAGNESIA If no BM in 3 days, give 30 cc Milk of Magnesium p.o. x 1 dose in 24 hours as needed (Do not use standing constipation orders for residents with renal failure CFR less than 30. Contact MD for orders) (Physician Order)   magnesium oxide 400 MG tablet Commonly known as: MAG-OX Take 400 mg by mouth daily.   memantine 10 MG tablet Commonly known as: NAMENDA Take 10 mg by mouth 2 (two) times daily.   metFORMIN 500 MG tablet Commonly known as: GLUCOPHAGE Take 500 mg by mouth daily in the afternoon. After dinner   metFORMIN 750 MG 24 hr tablet Commonly known as: GLUCOPHAGE-XR Take 750 mg by mouth daily. After breakfast   metoprolol tartrate 25 MG tablet Commonly known as: LOPRESSOR Take 12.5 mg by mouth every morning.   mometasone-formoterol 100-5 MCG/ACT Aero Commonly known as: DULERA Inhale 2 puffs into the lungs 2 (two) times daily.   NON FORMULARY DIET: Regular   NUTRITIONAL SUPPLEMENTS PO Take 1 each by mouth See admin instructions. Magic Cup with lunch and dinner meal   Omeprazole 20 MG Tbec Take 20 mg by mouth 2 (two) times daily.   RA SALINE ENEMA RE If not relieved by Biscodyl suppository, give disposable Saline Enema rectally X 1 dose/24 hrs as needed (Do not use constipation standing orders for residents with renal failure/CFR less than 30. Contact MD for orders)(Physician Or   sennosides-docusate sodium 8.6-50 MG tablet Commonly known as: SENOKOT-S Take 2 tablets by mouth daily.   traMADol 50 MG tablet Commonly known as: ULTRAM Take 1 tablet (50 mg total) by mouth daily.   UTI-Stat Liqd Take by mouth. Give 30 MLS by mouth every day for UTI   Vitamin D3 1.25 MG (50000 UT) Caps Take  1 capsule by mouth once a week.       Review of Systems  Unable to perform ROS: Dementia    Immunization History  Administered Date(s) Administered  . Influenza Split 08/20/2014  . Influenza, High Dose Seasonal PF 10/06/2013  . Influenza-Unspecified 10/17/2017, 08/29/2018, 08/27/2019, 09/09/2020  . Moderna Sars-Covid-2 Vaccination 11/20/2019, 12/18/2019, 10/07/2020  . PPD Test 11/16/2016, 12/05/2016  .  Pneumococcal Conjugate-13 10/17/2017  . Pneumococcal Polysaccharide-23 09/24/2009  . Tdap 10/18/2017   Pertinent  Health Maintenance Due  Topic Date Due  . OPHTHALMOLOGY EXAM  Never done  . FOOT EXAM  01/26/2021  . HEMOGLOBIN A1C  02/25/2021  . URINE MICROALBUMIN  08/27/2021  . INFLUENZA VACCINE  Completed  . DEXA SCAN  Completed  . PNA vac Low Risk Adult  Completed   Fall Risk  09/06/2020 04/19/2018  Falls in the past year? - No  Risk for fall due to : History of fall(s);Impaired balance/gait;Impaired mobility;Medication side effect;Mental status change -  Follow up Falls evaluation completed;Education provided;Falls prevention discussed;Follow up appointment -   Functional Status Survey:    Vitals:   01/07/21 0910  BP: (!) 143/65  Pulse: (!) 53  Resp: 20  Temp: (!) 97 F (36.1 C)  SpO2: 97%  Weight: 119 lb 3.2 oz (54.1 kg)  Height: 5\' 4"  (1.626 m)   Body mass index is 20.46 kg/m. Physical Exam Vitals reviewed.  Constitutional:      General: She is not in acute distress. HENT:     Head: Normocephalic.     Ears:     Comments: Unable to assess    Nose: Nose normal.     Mouth/Throat:     Mouth: Mucous membranes are moist.  Eyes:     General:        Right eye: No discharge.        Left eye: No discharge.  Cardiovascular:     Rate and Rhythm: Normal rate and regular rhythm.     Pulses: Normal pulses.     Heart sounds: Normal heart sounds. No murmur heard.   Pulmonary:     Effort: Pulmonary effort is normal. No respiratory distress.     Breath sounds:  Normal breath sounds. No wheezing.  Abdominal:     General: Bowel sounds are normal. There is no distension.     Tenderness: There is no abdominal tenderness.  Musculoskeletal:     Cervical back: Normal range of motion.     Right lower leg: No edema.     Left lower leg: No edema.  Lymphadenopathy:     Cervical: No cervical adenopathy.  Skin:    General: Skin is warm and dry.     Capillary Refill: Capillary refill takes less than 2 seconds.  Neurological:     General: No focal deficit present.     Mental Status: She is alert. Mental status is at baseline.     Motor: Weakness present.     Gait: Gait abnormal.     Comments: wheelchair  Psychiatric:        Mood and Affect: Mood normal. Affect is flat.        Behavior: Behavior normal.        Cognition and Memory: Memory is impaired.     Labs reviewed: Recent Labs    01/28/20 0000 04/08/20 0000 05/18/20 0000 08/27/20 0000  NA 141 145 139 136*  K 4.6  --  4.6 4.4  CL 104  --  101 96*  CO2 26*  --  21 26*  BUN 32* 26* 23* 21  CREATININE 1.0 0.7 0.8 0.7  CALCIUM 9.5 9.9 10.1 9.6   Recent Labs    08/27/20 0000  AST 12*  ALT 11  ALKPHOS 94  ALBUMIN 3.6   Recent Labs    01/28/20 0000 04/08/20 0000 08/27/20 0000  WBC 9.7 10.5 10.3  NEUTROABS 6  --  7  HGB 12.1 12.7 13.3  HCT 37 40 40  PLT 227 222 195   Lab Results  Component Value Date   TSH 4.04 08/27/2020   Lab Results  Component Value Date   HGBA1C 8 08/27/2020   Lab Results  Component Value Date   CHOL 137 08/27/2020   HDL 37 08/27/2020   LDLCALC 75 08/27/2020   TRIG 128 08/27/2020   CHOLHDL 2 12/19/2012    Significant Diagnostic Results in last 30 days:  No results found.  Assessment/Plan 1. Vascular dementia without behavioral disturbance (HCC) - no recent behavioral changes - cont namenda  2. Type 2 diabetes mellitus with other specified complication, without long-term current use of insulin (HCC) - blood sugars in PM > 170 - cont  metformin 750 mg po QAM and 500 mg QPM - a1c- future - diabetic eye exam requested last month- will f/u with appointment coordinator  3. Hypertension associated with type 2 diabetes mellitus (HCC) - bp at goal < 150/90 - cont amlodipine and metoprolol - cont low sodium diet < 2000 mg/day - cbc/diff- future - bmp- future  4. Gastroesophageal reflux disease without esophagitis - stable with omeprazole  5. Chronic obstructive pulmonary disease, unspecified COPD type ([place]) - does not require oxygen at this time  6. Hyperlipidemia due to type 2 diabetes mellitus ([place]) - stable with statin - lipid panel- future  7. Primary osteoarthritis involving multiple joints - stable with tramadol   8. Oropharyngeal dysphagia - no recent aspirations - cont regular diet and thin liquids    Family/ staff Communication: Plan discussed with facility nurse  Labs/tests ordered:  Cbc/diff, bmp, a1c, lipid panel

## 2021-01-10 ENCOUNTER — Other Ambulatory Visit: Payer: Self-pay | Admitting: Orthopedic Surgery

## 2021-01-10 ENCOUNTER — Encounter: Payer: Self-pay | Admitting: Internal Medicine

## 2021-01-10 DIAGNOSIS — M159 Polyosteoarthritis, unspecified: Secondary | ICD-10-CM

## 2021-01-10 DIAGNOSIS — M797 Fibromyalgia: Secondary | ICD-10-CM

## 2021-01-10 DIAGNOSIS — M8949 Other hypertrophic osteoarthropathy, multiple sites: Secondary | ICD-10-CM

## 2021-01-10 MED ORDER — TRAMADOL HCL 50 MG PO TABS
50.0000 mg | ORAL_TABLET | Freq: Every day | ORAL | 0 refills | Status: DC
Start: 2021-01-10 — End: 2021-01-26

## 2021-01-26 ENCOUNTER — Other Ambulatory Visit: Payer: Self-pay | Admitting: *Deleted

## 2021-01-26 DIAGNOSIS — M8949 Other hypertrophic osteoarthropathy, multiple sites: Secondary | ICD-10-CM

## 2021-01-26 DIAGNOSIS — M797 Fibromyalgia: Secondary | ICD-10-CM

## 2021-01-26 DIAGNOSIS — M159 Polyosteoarthritis, unspecified: Secondary | ICD-10-CM

## 2021-01-26 MED ORDER — TRAMADOL HCL 50 MG PO TABS: 50.0000 mg | ORAL_TABLET | Freq: Every day | ORAL | 0 refills | Status: DC

## 2021-01-26 NOTE — Telephone Encounter (Signed)
Received refill request from pharmacy/dr reed. Pended Rx and sent to Dr. Mariea Clonts for approval.

## 2021-02-02 ENCOUNTER — Encounter: Payer: Self-pay | Admitting: Orthopedic Surgery

## 2021-02-02 ENCOUNTER — Non-Acute Institutional Stay (SKILLED_NURSING_FACILITY): Payer: Medicare Other | Admitting: Orthopedic Surgery

## 2021-02-02 DIAGNOSIS — E785 Hyperlipidemia, unspecified: Secondary | ICD-10-CM

## 2021-02-02 DIAGNOSIS — E1159 Type 2 diabetes mellitus with other circulatory complications: Secondary | ICD-10-CM | POA: Diagnosis not present

## 2021-02-02 DIAGNOSIS — R1312 Dysphagia, oropharyngeal phase: Secondary | ICD-10-CM

## 2021-02-02 DIAGNOSIS — K219 Gastro-esophageal reflux disease without esophagitis: Secondary | ICD-10-CM

## 2021-02-02 DIAGNOSIS — F0150 Vascular dementia without behavioral disturbance: Secondary | ICD-10-CM

## 2021-02-02 DIAGNOSIS — E1169 Type 2 diabetes mellitus with other specified complication: Secondary | ICD-10-CM

## 2021-02-02 DIAGNOSIS — M8949 Other hypertrophic osteoarthropathy, multiple sites: Secondary | ICD-10-CM

## 2021-02-02 DIAGNOSIS — J449 Chronic obstructive pulmonary disease, unspecified: Secondary | ICD-10-CM

## 2021-02-02 DIAGNOSIS — M159 Polyosteoarthritis, unspecified: Secondary | ICD-10-CM

## 2021-02-02 DIAGNOSIS — I152 Hypertension secondary to endocrine disorders: Secondary | ICD-10-CM

## 2021-02-02 NOTE — Progress Notes (Signed)
Location:  [facility] Room Number: 202-W Place of Service:  SNF [phone]) Provider:  Windell Moulding, AGNP-C  Gayland Curry, DO  Patient Care Team: Alferd Apa as PCP - General (Geriatric Medicine)  Extended Emergency Contact Information Primary Emergency Contact: [name],[name] Address: [address]          [place], [place] [address] [name] of [place] Phone: [phone] Work Phone: [phone] Mobile Phone: [phone] Relation: Daughter Secondary Emergency Contact: [name],[name]  [place] Phone: [phone] Relation: Niece  Code Status: DNR Goals of care: Advanced Directive information Advanced Directives 02/02/2021  Does Patient Have a Medical Advance Directive? Yes  Type of Paramedic of [place];Living will;Out of facility DNR (pink MOST or yellow form)  Does patient want to make changes to medical advance directive? No - Patient declined  Copy of [place] in Chart? Yes - validated most recent copy scanned in chart (See row information)  Would patient like information on creating a medical advance directive? -  Pre-existing out of facility DNR order (yellow form or pink MOST form) Yellow form placed in chart (order not valid for inpatient use)     Chief Complaint  Patient presents with  . Medical Management of Chronic Issues    Routine Visit of Medical Management   . Quality Metric Gaps    Discuss need for Eye Exam    HPI:  Pt is a 79 y.o. female seen today for medical management of chronic diseases.    She is a resident of [facility], seen today in the hallway. PMH includes: cardiomyopathy, hypertension, COPD, dysphagia, GERD, hypothyroidism, type 2 diabetes, vascular dementia without behavioral disturbance, fibromyalgia, hyperlipidemia.  Today, she is sitting in her wheelchair in her room. She is alert to self only. Does not  follow commands or express needs. Continues to repeat one-worded phrases like " come here, " Damn it," or " help me."   Blood sugars continue to be elevated after increasing AM metformin last month. No hypoglycemic events noted.   No recent falls, injuries or hypoglycemic events.   Recent blood pressures are as follows:  03/15- 109/63  03/14- 145/62  03/13- 138/89  Facility nurse does not report any concerns, vitals stable.     Past Medical History:  Diagnosis Date  . Allergy to ACE inhibitors 03/17/2015  . Alzheimer disease ([place])   . Anxiety   . CAD (coronary artery disease)    a. s/p inferior STEMI 12/29/10 with rotablator atherectomy RCA 12/31/10 and DES x 2 RCA;  b. Lexiscan Myoview (02/2015): mild reversible apical anterior perfusion defect. C. cath 02/2015 50% LAD lesion with patent stent, Tx Rx    . Cardiomyopathy, ischemic 03/17/2015  . Chronic diastolic CHF (congestive heart failure) ([place]) 11/12/2016  . COPD (chronic obstructive pulmonary disease) ([place])   . Coronary atherosclerosis of native coronary artery 04/03/2013  . Dementia ([place])   . Dementia ([place])   . Depression 03/14/2017  . Diverticulosis   . DM2 (diabetes mellitus, type 2) ([place]) 04/02/2013  . Dysphagia 05/10/2017   Minimal 6/20 - pureed diet  . Fibromyalgia 04/20/2008   Qualifier: Diagnosis of  By: Nelson-Smith CMA (AAMA), Dottie    . GERD (gastroesophageal reflux disease)   . Glaucoma   . HTN (hypertension)   . Hyperlipidemia   . Ischemic cardiomyopathy    EF 45%cath 2012 EF55% 5/12; 35% 11/14  . Memory deficit   .  Nephrolithiasis   . Obesity (BMI 30-39.9) 10/06/2015  . Osteoarthritis   . Overweight(278.02)   . Respiratory arrest//ACE Inhibitor presumed cause   . Senile dementia without behavioral disturbance ([place]) 10/06/2015  . Sleep apnea   . Small bowel obstruction (HCC)    from a sigmoid stricture  . Ventricular tachycardia ([place]) 03/17/2015  . Vitamin D deficiency 01/28/2017   Past Surgical History:   Procedure Laterality Date  . bilateral tubal ligation    . CARDIAC CATHETERIZATION N/A 03/18/2015   Procedure: LEFT HEART CATH AND CORONARY ANGIOGRAPHY;  Surgeon: Lorretta Harp, MD;  Location: [facility] CATH LAB;  Service: Cardiovascular;  Laterality: N/A;  . CARDIAC SURGERY    . HIP ARTHROPLASTY Right 11/10/2016   Procedure: ARTHROPLASTY BIPOLAR HIP (HEMIARTHROPLASTY);  Surgeon: Marchia Bond, MD;  Location: [facility];  Service: Orthopedics;  Laterality: Right;  . 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 LITHOTRIPSY      Allergies  Allergen Reactions  . Lisinopril Other (See Comments)    Reaction:  Respiratory arrest   . Spironolactone Anaphylaxis  . Vancomycin Anaphylaxis and Rash  . Donepezil Diarrhea  . Eggs Or Egg-Derived Products Diarrhea  . Erythromycin Other (See Comments)    Reaction:  Makes pt feel weird   . Macrolides And Ketolides Other (See Comments)    Reaction:  Unknown   . Penicillins Itching and Other (See Comments)    Has patient had a PCN reaction causing immediate rash, facial/tongue/throat swelling, SOB or lightheadedness with hypotension: No Has patient had a PCN reaction causing severe rash involving mucus membranes or skin necrosis: No Has patient had a PCN reaction that required hospitalization No Has patient had a PCN reaction occurring within the last 10 years: No If all of the above answers are "NO", then may proceed with Cephalosporin use.  . Quinapril Hcl Itching  . Angiotensin Receptor Blockers Other (See Comments)    Reaction:  Unknown   . Lipitor [Atorvastatin] Palpitations  . Pentazocine Lactate Other (See Comments)    Reaction:  Unknown   . Propoxyphene Hcl Other (See Comments)    Reaction unknown  . Sulfonamide Derivatives Other (See Comments)    Reaction:  Unknown     Outpatient Encounter Medications as of 02/02/2021  Medication Sig  . amLODipine (NORVASC) 5 MG tablet Take 1 tablet (5 mg total) by mouth daily.  Marland Kitchen  aspirin EC 81 MG tablet Take 81 mg by mouth daily.  Marland Kitchen atorvastatin (LIPITOR) 40 MG tablet Take 40 mg by mouth daily.   . bisacodyl (DULCOLAX) 10 MG suppository If not relieved by MOM, give 10 mg Bisacodyl suppositiory rectally X 1 dose in 24 hours as needed (Do not use constipation standing orders for residents with renal failure/CFR less than 30. Contact MD for orders) (Physician Order)  . Cholecalciferol (VITAMIN D3) 1.25 MG (50000 UT) CAPS Take 1 capsule by mouth once a week.  . clopidogrel (PLAVIX) 75 MG tablet Take 75 mg by mouth daily.   . Cranberry-Vitamin C-Inulin (UTI-STAT) LIQD Take by mouth. Give 30 MLS by mouth every day for UTI  . furosemide (LASIX) 20 MG tablet Take 20 mg by mouth daily.   . isosorbide mononitrate (IMDUR) 30 MG 24 hr tablet Take 15 mg by mouth daily. Take 1/2 tablet = 15mg   . magnesium hydroxide (MILK OF MAGNESIA) 400 MG/5ML suspension If no BM in 3 days, give 30 cc Milk of Magnesium p.o. x 1 dose in 24 hours as needed (  Do not use standing constipation orders for residents with renal failure CFR less than 30. Contact MD for orders) (Physician Order)  . magnesium oxide (MAG-OX) 400 MG tablet Take 400 mg by mouth daily.  . memantine (NAMENDA) 10 MG tablet Take 10 mg by mouth 2 (two) times daily.   . metFORMIN (GLUCOPHAGE) 500 MG tablet Take 500 mg by mouth daily in the afternoon. After dinner  . metFORMIN (GLUCOPHAGE-XR) 750 MG 24 hr tablet Take 750 mg by mouth daily. After breakfast  . metoprolol tartrate (LOPRESSOR) 25 MG tablet Take 12.5 mg by mouth every morning.  . mometasone-formoterol (DULERA) 100-5 MCG/ACT AERO Inhale 2 puffs into the lungs 2 (two) times daily.   . NON FORMULARY DIET: Regular  . NUTRITIONAL SUPPLEMENTS PO Take 1 each by mouth See admin instructions. Magic Cup with lunch and dinner meal  . Omeprazole 20 MG TBEC Take 20 mg by mouth 2 (two) times daily.   . sennosides-docusate sodium (SENOKOT-S) 8.6-50 MG tablet Take 2 tablets by mouth daily.  .  Sodium Phosphates (RA SALINE ENEMA RE) If not relieved by Biscodyl suppository, give disposable Saline Enema rectally X 1 dose/24 hrs as needed (Do not use constipation standing orders for residents with renal failure/CFR less than 30. Contact MD for orders)(Physician Or  . traMADol (ULTRAM) 50 MG tablet Take 1 tablet (50 mg total) by mouth daily.   No facility-administered encounter medications on file as of 02/02/2021.    Review of Systems  Unable to perform ROS: Dementia    Immunization History  Administered Date(s) Administered  . Influenza Split 08/20/2014  . Influenza, High Dose Seasonal PF 10/06/2013  . Influenza-Unspecified 10/17/2017, 08/29/2018, 08/27/2019, 09/09/2020  . Moderna Sars-Covid-2 Vaccination 11/20/2019, 12/18/2019, 10/07/2020  . PPD Test 11/16/2016, 12/05/2016  . Pneumococcal Conjugate-13 10/17/2017  . Pneumococcal Polysaccharide-23 09/24/2009  . Tdap 10/18/2017   Pertinent  Health Maintenance Due  Topic Date Due  . OPHTHALMOLOGY EXAM  08/02/2019  . FOOT EXAM  01/26/2021  . HEMOGLOBIN A1C  02/25/2021  . URINE MICROALBUMIN  08/27/2021  . INFLUENZA VACCINE  Completed  . DEXA SCAN  Completed  . PNA vac Low Risk Adult  Completed   Fall Risk  09/06/2020 04/19/2018  Falls in the past year? - No  Risk for fall due to : History of fall(s);Impaired balance/gait;Impaired mobility;Medication side effect;Mental status change -  Follow up Falls evaluation completed;Education provided;Falls prevention discussed;Follow up appointment -   Functional Status Survey:    Vitals:   02/02/21 1618  BP: 109/63  Pulse: 67  Resp: 18  Temp: 97.9 F (36.6 C)  SpO2: 97%  Weight: 120 lb 6.4 oz (54.6 kg)  Height: 5\' 4"  (1.626 m)   Body mass index is 20.67 kg/m. Physical Exam Vitals reviewed.  Constitutional:      General: She is not in acute distress. HENT:     Head: Normocephalic.     Right Ear: There is no impacted cerumen.     Left Ear: There is no impacted cerumen.      Nose: Nose normal.     Mouth/Throat:     Mouth: Mucous membranes are moist.  Eyes:     General:        Right eye: No discharge.        Left eye: No discharge.  Cardiovascular:     Rate and Rhythm: Normal rate and regular rhythm.     Pulses: Normal pulses.     Heart sounds: Normal heart sounds. No murmur  heard.   Pulmonary:     Effort: Pulmonary effort is normal. No respiratory distress.     Breath sounds: Normal breath sounds. No wheezing.  Abdominal:     General: Bowel sounds are normal. There is no distension.     Palpations: Abdomen is soft.     Tenderness: There is no abdominal tenderness.  Musculoskeletal:     Cervical back: Normal range of motion.     Right lower leg: No edema.     Left lower leg: No edema.  Lymphadenopathy:     Cervical: No cervical adenopathy.  Skin:    General: Skin is warm and dry.     Capillary Refill: Capillary refill takes less than 2 seconds.     Comments: Dry skin throughout body  Neurological:     General: No focal deficit present.     Mental Status: She is alert. Mental status is at baseline.     Motor: Weakness present.     Gait: Gait abnormal.     Comments: wheelchair  Psychiatric:        Behavior: Behavior normal.        Cognition and Memory: Memory is impaired.     Labs reviewed: Recent Labs    04/08/20 0000 05/18/20 0000 08/27/20 0000  NA 145 139 136*  K  --  4.6 4.4  CL  --  101 96*  CO2  --  21 26*  BUN 26* 23* 21  CREATININE 0.7 0.8 0.7  CALCIUM 9.9 10.1 9.6   Recent Labs    08/27/20 0000  AST 12*  ALT 11  ALKPHOS 94  ALBUMIN 3.6   Recent Labs    04/08/20 0000 08/27/20 0000  WBC 10.5 10.3  NEUTROABS  --  7  HGB 12.7 13.3  HCT 40 40  PLT 222 195   Lab Results  Component Value Date   TSH 4.04 08/27/2020   Lab Results  Component Value Date   HGBA1C 8 08/27/2020   Lab Results  Component Value Date   CHOL 137 08/27/2020   HDL 37 08/27/2020   LDLCALC 75 08/27/2020   TRIG 128 08/27/2020    CHOLHDL 2 12/19/2012    Significant Diagnostic Results in last 30 days:  No results found.  Assessment/Plan 1. Type 2 diabetes mellitus with other specified complication, without long-term current use of insulin (HCC) - PM sugar remains > 250 - no hypoglycemic events - will increase metformin AM dose to 1000 mg po QAM- after breakfast - A1C next month  2. Vascular dementia without behavioral disturbance (HCC) - stable, no behavioral outbursts - con namenda  3. Hypertension associated with type 2 diabetes mellitus (HCC) - bp at goal, cont amlodipine and metoprolol - cont low sodium diet - cbc/diff- next month - bmp- next month  4. Gastroesophageal reflux disease without esophagitis - stable with omperazole  5. Chronic obstructive pulmonary disease, unspecified COPD type ([place]) - does not require oxygen at this time  6. Hyperlipidemia due to type 2 diabetes mellitus ([place]) - stable with statin - lipid panel- next month - hepatic panel- next month  7. Oropharyngeal dysphagia - no aspirations - cont regular diet and thin liquids  8. Primary osteoarthritis involving multiple joints - stable with tramadol     Family/ staff Communication: plan discussed with   Labs/tests ordered:  Cbc/diff, bmp, a1c, lipid panel, hepatic panel ordered for 02/18/2021

## 2021-02-02 NOTE — Progress Notes (Signed)
Location:    [facility] Room Number: 202-W Place of Service:  SNF [phone]) Provider: Windell Moulding NP   Gayland Curry, DO  Patient Care Team: Gayland Curry, DO as PCP - General (Geriatric Medicine)  Extended Emergency Contact Information Primary Emergency Contact: [name],[name] Address: [address]          [place],  [address] [name] of [place] Phone: [phone] Work Phone: [phone] Mobile Phone: [phone] Relation: Daughter Secondary Emergency Contact: [name],[name]  [place] Phone: [phone] Relation: Niece  Code Status:DNR   Goals of care: Advanced Directive information Advanced Directives 02/02/2021  Does Patient Have a Medical Advance Directive? Yes  Type of Paramedic of [place];Living will;Out of facility DNR (pink MOST or yellow form)  Does patient want to make changes to medical advance directive? No - Patient declined  Copy of [place] in Chart? Yes - validated most recent copy scanned in chart (See row information)  Would patient like information on creating a medical advance directive? -  Pre-existing out of facility DNR order (yellow form or pink MOST form) Yellow form placed in chart (order not valid for inpatient use)     Chief Complaint  Patient presents with  . Medical Management of Chronic Issues    Routine Visit of Medical Management   . Quality Metric Gaps    Discuss need for Eye Exam    HPI:  Pt is a 80 y.o. female seen today for medical management of chronic diseases.     Past Medical History:  Diagnosis Date  . Allergy to ACE inhibitors 03/17/2015  . Alzheimer disease ([place])   . Anxiety   . CAD (coronary artery disease)    a. s/p inferior STEMI 12/29/10 with rotablator atherectomy RCA 12/31/10 and DES x 2 RCA;  b. Lexiscan Myoview (02/2015): mild reversible apical anterior perfusion defect. C. cath 02/2015 50% LAD lesion with  patent stent, Tx Rx    . Cardiomyopathy, ischemic 03/17/2015  . Chronic diastolic CHF (congestive heart failure) ([place]) 11/12/2016  . COPD (chronic obstructive pulmonary disease) ([place])   . Coronary atherosclerosis of native coronary artery 04/03/2013  . Dementia ([place])   . Dementia ([place])   . Depression 03/14/2017  . Diverticulosis   . DM2 (diabetes mellitus, type 2) ([place]) 04/02/2013  . Dysphagia 05/10/2017   Minimal 6/20 - pureed diet  . Fibromyalgia 04/20/2008   Qualifier: Diagnosis of  By: Nelson-Smith CMA (AAMA), Dottie    . GERD (gastroesophageal reflux disease)   . Glaucoma   . HTN (hypertension)   . Hyperlipidemia   . Ischemic cardiomyopathy    EF 45%cath 2012 EF55% 5/12; 35% 11/14  . Memory deficit   . Nephrolithiasis   . Obesity (BMI 30-39.9) 10/06/2015  . Osteoarthritis   . Overweight(278.02)   . Respiratory arrest//ACE Inhibitor presumed cause   . 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 Sleep apnea   . Small bowel obstruction (HCC)    from a sigmoid stricture  . Ventricular tachycardia ([place]) 03/17/2015  . Vitamin D deficiency 01/28/2017   Past Surgical History:  Procedure Laterality Date  . bilateral tubal ligation    . CARDIAC CATHETERIZATION N/A 03/18/2015   Procedure: LEFT HEART CATH AND CORONARY ANGIOGRAPHY;  Surgeon: Lorretta Harp, MD;  Location: [facility] CATH LAB;  Service: Cardiovascular;  Laterality: N/A;  . CARDIAC SURGERY    . HIP ARTHROPLASTY Right 11/10/2016   Procedure:  ARTHROPLASTY BIPOLAR HIP (HEMIARTHROPLASTY);  Surgeon: Marchia Bond, MD;  Location: [facility];  Service: Orthopedics;  Laterality: Right;  . 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 LITHOTRIPSY      Allergies  Allergen Reactions  . Lisinopril Other (See Comments)    Reaction:  Respiratory arrest   . Spironolactone Anaphylaxis  . Vancomycin Anaphylaxis and Rash  . Donepezil Diarrhea  . Eggs Or Egg-Derived Products Diarrhea  . Erythromycin  Other (See Comments)    Reaction:  Makes pt feel weird   . Macrolides And Ketolides Other (See Comments)    Reaction:  Unknown   . Penicillins Itching and Other (See Comments)    Has patient had a PCN reaction causing immediate rash, facial/tongue/throat swelling, SOB or lightheadedness with hypotension: No Has patient had a PCN reaction causing severe rash involving mucus membranes or skin necrosis: No Has patient had a PCN reaction that required hospitalization No Has patient had a PCN reaction occurring within the last 10 years: No If all of the above answers are "NO", then may proceed with Cephalosporin use.  . Quinapril Hcl Itching  . Angiotensin Receptor Blockers Other (See Comments)    Reaction:  Unknown   . Lipitor [Atorvastatin] Palpitations  . Pentazocine Lactate Other (See Comments)    Reaction:  Unknown   . Propoxyphene Hcl Other (See Comments)    Reaction unknown  . Sulfonamide Derivatives Other (See Comments)    Reaction:  Unknown     Allergies as of 02/02/2021      Reactions   Lisinopril Other (See Comments)   Reaction:  Respiratory arrest    Spironolactone Anaphylaxis   Vancomycin Anaphylaxis, Rash   Donepezil Diarrhea   Eggs Or Egg-derived Products Diarrhea   Erythromycin Other (See Comments)   Reaction:  Makes pt feel weird    Macrolides And Ketolides Other (See Comments)   Reaction:  Unknown    Penicillins Itching, Other (See Comments)   Has patient had a PCN reaction causing immediate rash, facial/tongue/throat swelling, SOB or lightheadedness with hypotension: No Has patient had a PCN reaction causing severe rash involving mucus membranes or skin necrosis: No Has patient had a PCN reaction that required hospitalization No Has patient had a PCN reaction occurring within the last 10 years: No If all of the above answers are "NO", then may proceed with Cephalosporin use.   Quinapril Hcl Itching   Angiotensin Receptor Blockers Other (See Comments)   Reaction:   Unknown    Lipitor [atorvastatin] Palpitations   Pentazocine Lactate Other (See Comments)   Reaction:  Unknown    Propoxyphene Hcl Other (See Comments)   Reaction unknown   Sulfonamide Derivatives Other (See Comments)   Reaction:  Unknown       Medication List       Accurate as of February 02, 2021  4:54 PM. If you have any questions, ask your nurse or doctor.        amLODipine 5 MG tablet Commonly known as: NORVASC Take 1 tablet (5 mg total) by mouth daily.   aspirin EC 81 MG tablet Take 81 mg by mouth daily.   atorvastatin 40 MG tablet Commonly known as: LIPITOR Take 40 mg by mouth daily.   bisacodyl 10 MG suppository Commonly known as: DULCOLAX If not relieved by MOM, give 10 mg Bisacodyl suppositiory rectally X 1 dose in 24 hours as needed (Do not use constipation standing orders for residents with renal failure/CFR less than 30.  Contact MD for orders) (Physician Order)   clopidogrel 75 MG tablet Commonly known as: PLAVIX Take 75 mg by mouth daily.   furosemide 20 MG tablet Commonly known as: LASIX Take 20 mg by mouth daily.   isosorbide mononitrate 30 MG 24 hr tablet Commonly known as: IMDUR Take 15 mg by mouth daily. Take 1/2 tablet = 15mg    magnesium hydroxide 400 MG/5ML suspension Commonly known as: MILK OF MAGNESIA If no BM in 3 days, give 30 cc Milk of Magnesium p.o. x 1 dose in 24 hours as needed (Do not use standing constipation orders for residents with renal failure CFR less than 30. Contact MD for orders) (Physician Order)   magnesium oxide 400 MG tablet Commonly known as: MAG-OX Take 400 mg by mouth daily.   memantine 10 MG tablet Commonly known as: NAMENDA Take 10 mg by mouth 2 (two) times daily.   metFORMIN 500 MG tablet Commonly known as: GLUCOPHAGE Take 500 mg by mouth daily in the afternoon. After dinner   metFORMIN 750 MG 24 hr tablet Commonly known as: GLUCOPHAGE-XR Take 750 mg by mouth daily. After breakfast   metoprolol tartrate  25 MG tablet Commonly known as: LOPRESSOR Take 12.5 mg by mouth every morning.   mometasone-formoterol 100-5 MCG/ACT Aero Commonly known as: DULERA Inhale 2 puffs into the lungs 2 (two) times daily.   NON FORMULARY DIET: Regular   NUTRITIONAL SUPPLEMENTS PO Take 1 each by mouth See admin instructions. Magic Cup with lunch and dinner meal   Omeprazole 20 MG [place] Take 20 mg by mouth 2 (two) times daily.   RA SALINE ENEMA RE If not relieved by Biscodyl suppository, give disposable Saline Enema rectally X 1 dose/24 hrs as needed (Do not use constipation standing orders for residents with renal failure/CFR less than 30. Contact MD for orders)(Physician Or   sennosides-docusate sodium 8.6-50 MG tablet Commonly known as: SENOKOT-S Take 2 tablets by mouth daily.   traMADol 50 MG tablet Commonly known as: ULTRAM Take 1 tablet (50 mg total) by mouth daily.   UTI-Stat Liqd Take by mouth. Give 30 MLS by mouth every day for UTI   Vitamin D3 1.25 MG (50000 UT) Caps Take 1 capsule by mouth once a week.       Review of Systems  Immunization History  Administered Date(s) Administered  . Influenza Split 08/20/2014  . Influenza, High Dose Seasonal PF 10/06/2013  . Influenza-Unspecified 10/17/2017, 08/29/2018, 08/27/2019, 09/09/2020  . Moderna Sars-Covid-2 Vaccination 11/20/2019, 12/18/2019, 10/07/2020  . PPD Test 11/16/2016, 12/05/2016  . Pneumococcal Conjugate-13 10/17/2017  . Pneumococcal Polysaccharide-23 09/24/2009  . Tdap 10/18/2017   Pertinent  Health Maintenance Due  Topic Date Due  . OPHTHALMOLOGY EXAM  08/02/2019  . FOOT EXAM  01/26/2021  . HEMOGLOBIN A1C  02/25/2021  . URINE MICROALBUMIN  08/27/2021  . INFLUENZA VACCINE  Completed  . DEXA SCAN  Completed  . PNA vac Low Risk Adult  Completed   Fall Risk  09/06/2020 04/19/2018  Falls in the past year? - No  Risk for fall due to : History of fall(s);Impaired balance/gait;Impaired mobility;Medication side effect;Mental  status change -  Follow up Falls evaluation completed;Education provided;Falls prevention discussed;Follow up appointment -   Functional Status Survey:    Vitals:   02/02/21 1618  BP: 109/63  Pulse: 67  Resp: 18  Temp: 97.9 F (36.6 C)  SpO2: 97%  Weight: 120 lb 6.4 oz (54.6 kg)  Height: 5\' 4"  (1.626 m)   Body mass index  is 20.67 kg/m. Physical Exam  Labs reviewed: Recent Labs    04/08/20 0000 05/18/20 0000 08/27/20 0000  NA 145 139 136*  K  --  4.6 4.4  CL  --  101 96*  CO2  --  21 26*  BUN 26* 23* 21  CREATININE 0.7 0.8 0.7  CALCIUM 9.9 10.1 9.6   Recent Labs    08/27/20 0000  AST 12*  ALT 11  ALKPHOS 94  ALBUMIN 3.6   Recent Labs    04/08/20 0000 08/27/20 0000  WBC 10.5 10.3  NEUTROABS  --  7  HGB 12.7 13.3  HCT 40 40  PLT 222 195   Lab Results  Component Value Date   TSH 4.04 08/27/2020   Lab Results  Component Value Date   HGBA1C 8 08/27/2020   Lab Results  Component Value Date   CHOL 137 08/27/2020   HDL 37 08/27/2020   LDLCALC 75 08/27/2020   TRIG 128 08/27/2020   CHOLHDL 2 12/19/2012    Significant Diagnostic Results in last 30 days:  No results found.  Assessment/Plan 1. Type 2 diabetes mellitus with other specified complication, without long-term current use of insulin (HCC)   2. Vascular dementia without behavioral disturbance ([place])   3. Hypertension associated with type 2 diabetes mellitus ([place])   4. Gastroesophageal reflux disease without esophagitis   5. Chronic obstructive pulmonary disease, unspecified COPD type ([place])   6. Hyperlipidemia due to type 2 diabetes mellitus ([place])   7. Oropharyngeal dysphagia   8. Primary osteoarthritis involving multiple joints     Family/ staff Communication:   Labs/tests ordered:

## 2021-05-26 DIAGNOSIS — E1159 Type 2 diabetes mellitus with other circulatory complications: Secondary | ICD-10-CM | POA: Diagnosis not present

## 2021-05-26 DIAGNOSIS — E1169 Type 2 diabetes mellitus with other specified complication: Secondary | ICD-10-CM | POA: Diagnosis not present

## 2021-05-26 DIAGNOSIS — I5032 Chronic diastolic (congestive) heart failure: Secondary | ICD-10-CM | POA: Diagnosis not present

## 2021-05-26 DIAGNOSIS — I152 Hypertension secondary to endocrine disorders: Secondary | ICD-10-CM | POA: Diagnosis not present

## 2021-06-23 DIAGNOSIS — Z1339 Encounter for screening examination for other mental health and behavioral disorders: Secondary | ICD-10-CM | POA: Diagnosis not present

## 2021-06-23 DIAGNOSIS — Z139 Encounter for screening, unspecified: Secondary | ICD-10-CM | POA: Diagnosis not present

## 2021-06-23 DIAGNOSIS — Z1331 Encounter for screening for depression: Secondary | ICD-10-CM | POA: Diagnosis not present

## 2021-06-23 DIAGNOSIS — Z0000 Encounter for general adult medical examination without abnormal findings: Secondary | ICD-10-CM | POA: Diagnosis not present

## 2021-06-30 DIAGNOSIS — Z794 Long term (current) use of insulin: Secondary | ICD-10-CM | POA: Diagnosis not present

## 2021-06-30 DIAGNOSIS — E785 Hyperlipidemia, unspecified: Secondary | ICD-10-CM | POA: Diagnosis not present

## 2021-06-30 DIAGNOSIS — E1169 Type 2 diabetes mellitus with other specified complication: Secondary | ICD-10-CM | POA: Diagnosis not present

## 2021-06-30 DIAGNOSIS — E1165 Type 2 diabetes mellitus with hyperglycemia: Secondary | ICD-10-CM | POA: Diagnosis not present

## 2021-07-01 DIAGNOSIS — E119 Type 2 diabetes mellitus without complications: Secondary | ICD-10-CM | POA: Diagnosis not present

## 2021-08-25 DIAGNOSIS — Z794 Long term (current) use of insulin: Secondary | ICD-10-CM | POA: Diagnosis not present

## 2021-08-25 DIAGNOSIS — F0390 Unspecified dementia without behavioral disturbance: Secondary | ICD-10-CM | POA: Diagnosis not present

## 2021-08-25 DIAGNOSIS — M797 Fibromyalgia: Secondary | ICD-10-CM | POA: Diagnosis not present

## 2021-08-25 DIAGNOSIS — E1165 Type 2 diabetes mellitus with hyperglycemia: Secondary | ICD-10-CM | POA: Diagnosis not present

## 2021-09-17 DIAGNOSIS — R1312 Dysphagia, oropharyngeal phase: Secondary | ICD-10-CM | POA: Diagnosis not present

## 2021-09-27 DIAGNOSIS — E1165 Type 2 diabetes mellitus with hyperglycemia: Secondary | ICD-10-CM | POA: Diagnosis not present

## 2021-09-27 DIAGNOSIS — E1169 Type 2 diabetes mellitus with other specified complication: Secondary | ICD-10-CM | POA: Diagnosis not present

## 2021-09-27 DIAGNOSIS — E785 Hyperlipidemia, unspecified: Secondary | ICD-10-CM | POA: Diagnosis not present

## 2021-09-27 DIAGNOSIS — Z794 Long term (current) use of insulin: Secondary | ICD-10-CM | POA: Diagnosis not present

## 2021-09-28 DIAGNOSIS — E1122 Type 2 diabetes mellitus with diabetic chronic kidney disease: Secondary | ICD-10-CM | POA: Diagnosis not present

## 2021-10-04 DIAGNOSIS — J449 Chronic obstructive pulmonary disease, unspecified: Secondary | ICD-10-CM | POA: Diagnosis not present

## 2021-10-04 DIAGNOSIS — R279 Unspecified lack of coordination: Secondary | ICD-10-CM | POA: Diagnosis not present

## 2021-10-04 DIAGNOSIS — I5032 Chronic diastolic (congestive) heart failure: Secondary | ICD-10-CM | POA: Diagnosis not present

## 2021-10-04 DIAGNOSIS — E785 Hyperlipidemia, unspecified: Secondary | ICD-10-CM | POA: Diagnosis not present

## 2021-10-04 DIAGNOSIS — M6281 Muscle weakness (generalized): Secondary | ICD-10-CM | POA: Diagnosis not present

## 2021-10-05 DIAGNOSIS — J449 Chronic obstructive pulmonary disease, unspecified: Secondary | ICD-10-CM | POA: Diagnosis not present

## 2021-10-05 DIAGNOSIS — E785 Hyperlipidemia, unspecified: Secondary | ICD-10-CM | POA: Diagnosis not present

## 2021-10-05 DIAGNOSIS — R279 Unspecified lack of coordination: Secondary | ICD-10-CM | POA: Diagnosis not present

## 2021-10-05 DIAGNOSIS — I5032 Chronic diastolic (congestive) heart failure: Secondary | ICD-10-CM | POA: Diagnosis not present

## 2021-10-05 DIAGNOSIS — M6281 Muscle weakness (generalized): Secondary | ICD-10-CM | POA: Diagnosis not present

## 2021-10-06 DIAGNOSIS — I5032 Chronic diastolic (congestive) heart failure: Secondary | ICD-10-CM | POA: Diagnosis not present

## 2021-10-06 DIAGNOSIS — R279 Unspecified lack of coordination: Secondary | ICD-10-CM | POA: Diagnosis not present

## 2021-10-06 DIAGNOSIS — E785 Hyperlipidemia, unspecified: Secondary | ICD-10-CM | POA: Diagnosis not present

## 2021-10-06 DIAGNOSIS — J449 Chronic obstructive pulmonary disease, unspecified: Secondary | ICD-10-CM | POA: Diagnosis not present

## 2021-10-06 DIAGNOSIS — M6281 Muscle weakness (generalized): Secondary | ICD-10-CM | POA: Diagnosis not present

## 2021-10-07 DIAGNOSIS — I5032 Chronic diastolic (congestive) heart failure: Secondary | ICD-10-CM | POA: Diagnosis not present

## 2021-10-07 DIAGNOSIS — J449 Chronic obstructive pulmonary disease, unspecified: Secondary | ICD-10-CM | POA: Diagnosis not present

## 2021-10-07 DIAGNOSIS — M6281 Muscle weakness (generalized): Secondary | ICD-10-CM | POA: Diagnosis not present

## 2021-10-07 DIAGNOSIS — R279 Unspecified lack of coordination: Secondary | ICD-10-CM | POA: Diagnosis not present

## 2021-10-07 DIAGNOSIS — E785 Hyperlipidemia, unspecified: Secondary | ICD-10-CM | POA: Diagnosis not present

## 2021-10-10 DIAGNOSIS — I5032 Chronic diastolic (congestive) heart failure: Secondary | ICD-10-CM | POA: Diagnosis not present

## 2021-10-10 DIAGNOSIS — R279 Unspecified lack of coordination: Secondary | ICD-10-CM | POA: Diagnosis not present

## 2021-10-10 DIAGNOSIS — J449 Chronic obstructive pulmonary disease, unspecified: Secondary | ICD-10-CM | POA: Diagnosis not present

## 2021-10-10 DIAGNOSIS — M6281 Muscle weakness (generalized): Secondary | ICD-10-CM | POA: Diagnosis not present

## 2021-10-10 DIAGNOSIS — E785 Hyperlipidemia, unspecified: Secondary | ICD-10-CM | POA: Diagnosis not present

## 2021-10-11 DIAGNOSIS — I5032 Chronic diastolic (congestive) heart failure: Secondary | ICD-10-CM | POA: Diagnosis not present

## 2021-10-11 DIAGNOSIS — J449 Chronic obstructive pulmonary disease, unspecified: Secondary | ICD-10-CM | POA: Diagnosis not present

## 2021-10-11 DIAGNOSIS — E785 Hyperlipidemia, unspecified: Secondary | ICD-10-CM | POA: Diagnosis not present

## 2021-10-11 DIAGNOSIS — M6281 Muscle weakness (generalized): Secondary | ICD-10-CM | POA: Diagnosis not present

## 2021-10-11 DIAGNOSIS — R279 Unspecified lack of coordination: Secondary | ICD-10-CM | POA: Diagnosis not present

## 2021-10-12 DIAGNOSIS — M6281 Muscle weakness (generalized): Secondary | ICD-10-CM | POA: Diagnosis not present

## 2021-10-12 DIAGNOSIS — R279 Unspecified lack of coordination: Secondary | ICD-10-CM | POA: Diagnosis not present

## 2021-10-12 DIAGNOSIS — J449 Chronic obstructive pulmonary disease, unspecified: Secondary | ICD-10-CM | POA: Diagnosis not present

## 2021-10-12 DIAGNOSIS — I5032 Chronic diastolic (congestive) heart failure: Secondary | ICD-10-CM | POA: Diagnosis not present

## 2021-10-12 DIAGNOSIS — E785 Hyperlipidemia, unspecified: Secondary | ICD-10-CM | POA: Diagnosis not present

## 2021-10-13 DIAGNOSIS — J449 Chronic obstructive pulmonary disease, unspecified: Secondary | ICD-10-CM | POA: Diagnosis not present

## 2021-10-13 DIAGNOSIS — E785 Hyperlipidemia, unspecified: Secondary | ICD-10-CM | POA: Diagnosis not present

## 2021-10-13 DIAGNOSIS — M6281 Muscle weakness (generalized): Secondary | ICD-10-CM | POA: Diagnosis not present

## 2021-10-13 DIAGNOSIS — I5032 Chronic diastolic (congestive) heart failure: Secondary | ICD-10-CM | POA: Diagnosis not present

## 2021-10-13 DIAGNOSIS — R279 Unspecified lack of coordination: Secondary | ICD-10-CM | POA: Diagnosis not present

## 2021-10-14 DIAGNOSIS — I5032 Chronic diastolic (congestive) heart failure: Secondary | ICD-10-CM | POA: Diagnosis not present

## 2021-10-14 DIAGNOSIS — E785 Hyperlipidemia, unspecified: Secondary | ICD-10-CM | POA: Diagnosis not present

## 2021-10-14 DIAGNOSIS — M6281 Muscle weakness (generalized): Secondary | ICD-10-CM | POA: Diagnosis not present

## 2021-10-14 DIAGNOSIS — R279 Unspecified lack of coordination: Secondary | ICD-10-CM | POA: Diagnosis not present

## 2021-10-14 DIAGNOSIS — J449 Chronic obstructive pulmonary disease, unspecified: Secondary | ICD-10-CM | POA: Diagnosis not present

## 2021-10-17 DIAGNOSIS — J449 Chronic obstructive pulmonary disease, unspecified: Secondary | ICD-10-CM | POA: Diagnosis not present

## 2021-10-17 DIAGNOSIS — M6281 Muscle weakness (generalized): Secondary | ICD-10-CM | POA: Diagnosis not present

## 2021-10-17 DIAGNOSIS — R279 Unspecified lack of coordination: Secondary | ICD-10-CM | POA: Diagnosis not present

## 2021-10-17 DIAGNOSIS — E785 Hyperlipidemia, unspecified: Secondary | ICD-10-CM | POA: Diagnosis not present

## 2021-10-17 DIAGNOSIS — I5032 Chronic diastolic (congestive) heart failure: Secondary | ICD-10-CM | POA: Diagnosis not present

## 2021-10-19 DIAGNOSIS — M6281 Muscle weakness (generalized): Secondary | ICD-10-CM | POA: Diagnosis not present

## 2021-10-19 DIAGNOSIS — J449 Chronic obstructive pulmonary disease, unspecified: Secondary | ICD-10-CM | POA: Diagnosis not present

## 2021-10-19 DIAGNOSIS — R279 Unspecified lack of coordination: Secondary | ICD-10-CM | POA: Diagnosis not present

## 2021-10-19 DIAGNOSIS — E785 Hyperlipidemia, unspecified: Secondary | ICD-10-CM | POA: Diagnosis not present

## 2021-10-19 DIAGNOSIS — I5032 Chronic diastolic (congestive) heart failure: Secondary | ICD-10-CM | POA: Diagnosis not present

## 2021-10-20 DIAGNOSIS — I5032 Chronic diastolic (congestive) heart failure: Secondary | ICD-10-CM | POA: Diagnosis not present

## 2021-10-20 DIAGNOSIS — M6281 Muscle weakness (generalized): Secondary | ICD-10-CM | POA: Diagnosis not present

## 2021-10-20 DIAGNOSIS — J449 Chronic obstructive pulmonary disease, unspecified: Secondary | ICD-10-CM | POA: Diagnosis not present

## 2021-10-20 DIAGNOSIS — R279 Unspecified lack of coordination: Secondary | ICD-10-CM | POA: Diagnosis not present

## 2021-10-20 DIAGNOSIS — R1312 Dysphagia, oropharyngeal phase: Secondary | ICD-10-CM | POA: Diagnosis not present

## 2021-10-20 DIAGNOSIS — E785 Hyperlipidemia, unspecified: Secondary | ICD-10-CM | POA: Diagnosis not present

## 2021-10-21 DIAGNOSIS — E785 Hyperlipidemia, unspecified: Secondary | ICD-10-CM | POA: Diagnosis not present

## 2021-10-21 DIAGNOSIS — R1312 Dysphagia, oropharyngeal phase: Secondary | ICD-10-CM | POA: Diagnosis not present

## 2021-10-21 DIAGNOSIS — R279 Unspecified lack of coordination: Secondary | ICD-10-CM | POA: Diagnosis not present

## 2021-10-21 DIAGNOSIS — J449 Chronic obstructive pulmonary disease, unspecified: Secondary | ICD-10-CM | POA: Diagnosis not present

## 2021-10-21 DIAGNOSIS — I5032 Chronic diastolic (congestive) heart failure: Secondary | ICD-10-CM | POA: Diagnosis not present

## 2021-10-21 DIAGNOSIS — M6281 Muscle weakness (generalized): Secondary | ICD-10-CM | POA: Diagnosis not present

## 2021-10-22 DIAGNOSIS — E785 Hyperlipidemia, unspecified: Secondary | ICD-10-CM | POA: Diagnosis not present

## 2021-10-22 DIAGNOSIS — M6281 Muscle weakness (generalized): Secondary | ICD-10-CM | POA: Diagnosis not present

## 2021-10-22 DIAGNOSIS — J449 Chronic obstructive pulmonary disease, unspecified: Secondary | ICD-10-CM | POA: Diagnosis not present

## 2021-10-22 DIAGNOSIS — R1312 Dysphagia, oropharyngeal phase: Secondary | ICD-10-CM | POA: Diagnosis not present

## 2021-10-22 DIAGNOSIS — I5032 Chronic diastolic (congestive) heart failure: Secondary | ICD-10-CM | POA: Diagnosis not present

## 2021-10-22 DIAGNOSIS — R279 Unspecified lack of coordination: Secondary | ICD-10-CM | POA: Diagnosis not present

## 2021-10-23 DIAGNOSIS — I5032 Chronic diastolic (congestive) heart failure: Secondary | ICD-10-CM | POA: Diagnosis not present

## 2021-10-23 DIAGNOSIS — R1312 Dysphagia, oropharyngeal phase: Secondary | ICD-10-CM | POA: Diagnosis not present

## 2021-10-23 DIAGNOSIS — E785 Hyperlipidemia, unspecified: Secondary | ICD-10-CM | POA: Diagnosis not present

## 2021-10-23 DIAGNOSIS — M6281 Muscle weakness (generalized): Secondary | ICD-10-CM | POA: Diagnosis not present

## 2021-10-23 DIAGNOSIS — R279 Unspecified lack of coordination: Secondary | ICD-10-CM | POA: Diagnosis not present

## 2021-10-23 DIAGNOSIS — J449 Chronic obstructive pulmonary disease, unspecified: Secondary | ICD-10-CM | POA: Diagnosis not present

## 2021-10-24 DIAGNOSIS — M6281 Muscle weakness (generalized): Secondary | ICD-10-CM | POA: Diagnosis not present

## 2021-10-24 DIAGNOSIS — R279 Unspecified lack of coordination: Secondary | ICD-10-CM | POA: Diagnosis not present

## 2021-10-24 DIAGNOSIS — I5032 Chronic diastolic (congestive) heart failure: Secondary | ICD-10-CM | POA: Diagnosis not present

## 2021-10-24 DIAGNOSIS — J449 Chronic obstructive pulmonary disease, unspecified: Secondary | ICD-10-CM | POA: Diagnosis not present

## 2021-10-24 DIAGNOSIS — R1312 Dysphagia, oropharyngeal phase: Secondary | ICD-10-CM | POA: Diagnosis not present

## 2021-10-24 DIAGNOSIS — E785 Hyperlipidemia, unspecified: Secondary | ICD-10-CM | POA: Diagnosis not present

## 2021-10-25 DIAGNOSIS — I5032 Chronic diastolic (congestive) heart failure: Secondary | ICD-10-CM | POA: Diagnosis not present

## 2021-10-25 DIAGNOSIS — R279 Unspecified lack of coordination: Secondary | ICD-10-CM | POA: Diagnosis not present

## 2021-10-25 DIAGNOSIS — M6281 Muscle weakness (generalized): Secondary | ICD-10-CM | POA: Diagnosis not present

## 2021-10-25 DIAGNOSIS — R1312 Dysphagia, oropharyngeal phase: Secondary | ICD-10-CM | POA: Diagnosis not present

## 2021-10-25 DIAGNOSIS — J449 Chronic obstructive pulmonary disease, unspecified: Secondary | ICD-10-CM | POA: Diagnosis not present

## 2021-10-25 DIAGNOSIS — E785 Hyperlipidemia, unspecified: Secondary | ICD-10-CM | POA: Diagnosis not present

## 2021-10-26 DIAGNOSIS — J449 Chronic obstructive pulmonary disease, unspecified: Secondary | ICD-10-CM | POA: Diagnosis not present

## 2021-10-26 DIAGNOSIS — M6281 Muscle weakness (generalized): Secondary | ICD-10-CM | POA: Diagnosis not present

## 2021-10-26 DIAGNOSIS — I5032 Chronic diastolic (congestive) heart failure: Secondary | ICD-10-CM | POA: Diagnosis not present

## 2021-10-26 DIAGNOSIS — R1312 Dysphagia, oropharyngeal phase: Secondary | ICD-10-CM | POA: Diagnosis not present

## 2021-10-26 DIAGNOSIS — E785 Hyperlipidemia, unspecified: Secondary | ICD-10-CM | POA: Diagnosis not present

## 2021-10-26 DIAGNOSIS — R279 Unspecified lack of coordination: Secondary | ICD-10-CM | POA: Diagnosis not present

## 2021-10-27 DIAGNOSIS — J449 Chronic obstructive pulmonary disease, unspecified: Secondary | ICD-10-CM | POA: Diagnosis not present

## 2021-10-27 DIAGNOSIS — R279 Unspecified lack of coordination: Secondary | ICD-10-CM | POA: Diagnosis not present

## 2021-10-27 DIAGNOSIS — D692 Other nonthrombocytopenic purpura: Secondary | ICD-10-CM | POA: Diagnosis not present

## 2021-10-27 DIAGNOSIS — E785 Hyperlipidemia, unspecified: Secondary | ICD-10-CM | POA: Diagnosis not present

## 2021-10-27 DIAGNOSIS — M6281 Muscle weakness (generalized): Secondary | ICD-10-CM | POA: Diagnosis not present

## 2021-10-27 DIAGNOSIS — R1312 Dysphagia, oropharyngeal phase: Secondary | ICD-10-CM | POA: Diagnosis not present

## 2021-10-27 DIAGNOSIS — I5032 Chronic diastolic (congestive) heart failure: Secondary | ICD-10-CM | POA: Diagnosis not present

## 2021-10-27 DIAGNOSIS — F0390 Unspecified dementia without behavioral disturbance: Secondary | ICD-10-CM | POA: Diagnosis not present

## 2021-10-28 DIAGNOSIS — J449 Chronic obstructive pulmonary disease, unspecified: Secondary | ICD-10-CM | POA: Diagnosis not present

## 2021-10-28 DIAGNOSIS — R1312 Dysphagia, oropharyngeal phase: Secondary | ICD-10-CM | POA: Diagnosis not present

## 2021-10-28 DIAGNOSIS — I5032 Chronic diastolic (congestive) heart failure: Secondary | ICD-10-CM | POA: Diagnosis not present

## 2021-10-28 DIAGNOSIS — R279 Unspecified lack of coordination: Secondary | ICD-10-CM | POA: Diagnosis not present

## 2021-10-28 DIAGNOSIS — E785 Hyperlipidemia, unspecified: Secondary | ICD-10-CM | POA: Diagnosis not present

## 2021-10-28 DIAGNOSIS — M6281 Muscle weakness (generalized): Secondary | ICD-10-CM | POA: Diagnosis not present

## 2021-10-29 DIAGNOSIS — M6281 Muscle weakness (generalized): Secondary | ICD-10-CM | POA: Diagnosis not present

## 2021-10-29 DIAGNOSIS — I5032 Chronic diastolic (congestive) heart failure: Secondary | ICD-10-CM | POA: Diagnosis not present

## 2021-10-29 DIAGNOSIS — E785 Hyperlipidemia, unspecified: Secondary | ICD-10-CM | POA: Diagnosis not present

## 2021-10-29 DIAGNOSIS — R279 Unspecified lack of coordination: Secondary | ICD-10-CM | POA: Diagnosis not present

## 2021-10-29 DIAGNOSIS — R1312 Dysphagia, oropharyngeal phase: Secondary | ICD-10-CM | POA: Diagnosis not present

## 2021-10-29 DIAGNOSIS — J449 Chronic obstructive pulmonary disease, unspecified: Secondary | ICD-10-CM | POA: Diagnosis not present

## 2021-10-30 DIAGNOSIS — R1312 Dysphagia, oropharyngeal phase: Secondary | ICD-10-CM | POA: Diagnosis not present

## 2021-10-30 DIAGNOSIS — E785 Hyperlipidemia, unspecified: Secondary | ICD-10-CM | POA: Diagnosis not present

## 2021-10-30 DIAGNOSIS — J449 Chronic obstructive pulmonary disease, unspecified: Secondary | ICD-10-CM | POA: Diagnosis not present

## 2021-10-30 DIAGNOSIS — I5032 Chronic diastolic (congestive) heart failure: Secondary | ICD-10-CM | POA: Diagnosis not present

## 2021-10-30 DIAGNOSIS — M6281 Muscle weakness (generalized): Secondary | ICD-10-CM | POA: Diagnosis not present

## 2021-10-30 DIAGNOSIS — R279 Unspecified lack of coordination: Secondary | ICD-10-CM | POA: Diagnosis not present

## 2021-11-03 DIAGNOSIS — R1312 Dysphagia, oropharyngeal phase: Secondary | ICD-10-CM | POA: Diagnosis not present

## 2021-11-03 DIAGNOSIS — E785 Hyperlipidemia, unspecified: Secondary | ICD-10-CM | POA: Diagnosis not present

## 2021-11-03 DIAGNOSIS — J449 Chronic obstructive pulmonary disease, unspecified: Secondary | ICD-10-CM | POA: Diagnosis not present

## 2021-11-03 DIAGNOSIS — M6281 Muscle weakness (generalized): Secondary | ICD-10-CM | POA: Diagnosis not present

## 2021-11-03 DIAGNOSIS — R279 Unspecified lack of coordination: Secondary | ICD-10-CM | POA: Diagnosis not present

## 2021-11-03 DIAGNOSIS — I5032 Chronic diastolic (congestive) heart failure: Secondary | ICD-10-CM | POA: Diagnosis not present

## 2021-11-05 DIAGNOSIS — E785 Hyperlipidemia, unspecified: Secondary | ICD-10-CM | POA: Diagnosis not present

## 2021-11-05 DIAGNOSIS — R279 Unspecified lack of coordination: Secondary | ICD-10-CM | POA: Diagnosis not present

## 2021-11-05 DIAGNOSIS — J449 Chronic obstructive pulmonary disease, unspecified: Secondary | ICD-10-CM | POA: Diagnosis not present

## 2021-11-05 DIAGNOSIS — R1312 Dysphagia, oropharyngeal phase: Secondary | ICD-10-CM | POA: Diagnosis not present

## 2021-11-05 DIAGNOSIS — I5032 Chronic diastolic (congestive) heart failure: Secondary | ICD-10-CM | POA: Diagnosis not present

## 2021-11-05 DIAGNOSIS — M6281 Muscle weakness (generalized): Secondary | ICD-10-CM | POA: Diagnosis not present

## 2021-11-06 DIAGNOSIS — M6281 Muscle weakness (generalized): Secondary | ICD-10-CM | POA: Diagnosis not present

## 2021-11-06 DIAGNOSIS — E785 Hyperlipidemia, unspecified: Secondary | ICD-10-CM | POA: Diagnosis not present

## 2021-11-06 DIAGNOSIS — I5032 Chronic diastolic (congestive) heart failure: Secondary | ICD-10-CM | POA: Diagnosis not present

## 2021-11-06 DIAGNOSIS — R279 Unspecified lack of coordination: Secondary | ICD-10-CM | POA: Diagnosis not present

## 2021-11-06 DIAGNOSIS — R1312 Dysphagia, oropharyngeal phase: Secondary | ICD-10-CM | POA: Diagnosis not present

## 2021-11-06 DIAGNOSIS — J449 Chronic obstructive pulmonary disease, unspecified: Secondary | ICD-10-CM | POA: Diagnosis not present

## 2021-11-07 DIAGNOSIS — E785 Hyperlipidemia, unspecified: Secondary | ICD-10-CM | POA: Diagnosis not present

## 2021-11-07 DIAGNOSIS — R1312 Dysphagia, oropharyngeal phase: Secondary | ICD-10-CM | POA: Diagnosis not present

## 2021-11-07 DIAGNOSIS — M6281 Muscle weakness (generalized): Secondary | ICD-10-CM | POA: Diagnosis not present

## 2021-11-07 DIAGNOSIS — R279 Unspecified lack of coordination: Secondary | ICD-10-CM | POA: Diagnosis not present

## 2021-11-07 DIAGNOSIS — J449 Chronic obstructive pulmonary disease, unspecified: Secondary | ICD-10-CM | POA: Diagnosis not present

## 2021-11-07 DIAGNOSIS — I5032 Chronic diastolic (congestive) heart failure: Secondary | ICD-10-CM | POA: Diagnosis not present

## 2021-11-08 DIAGNOSIS — I5032 Chronic diastolic (congestive) heart failure: Secondary | ICD-10-CM | POA: Diagnosis not present

## 2021-11-08 DIAGNOSIS — J449 Chronic obstructive pulmonary disease, unspecified: Secondary | ICD-10-CM | POA: Diagnosis not present

## 2021-11-08 DIAGNOSIS — E785 Hyperlipidemia, unspecified: Secondary | ICD-10-CM | POA: Diagnosis not present

## 2021-11-08 DIAGNOSIS — R279 Unspecified lack of coordination: Secondary | ICD-10-CM | POA: Diagnosis not present

## 2021-11-08 DIAGNOSIS — M6281 Muscle weakness (generalized): Secondary | ICD-10-CM | POA: Diagnosis not present

## 2021-11-08 DIAGNOSIS — R1312 Dysphagia, oropharyngeal phase: Secondary | ICD-10-CM | POA: Diagnosis not present

## 2021-11-10 DIAGNOSIS — E785 Hyperlipidemia, unspecified: Secondary | ICD-10-CM | POA: Diagnosis not present

## 2021-11-10 DIAGNOSIS — M6281 Muscle weakness (generalized): Secondary | ICD-10-CM | POA: Diagnosis not present

## 2021-11-10 DIAGNOSIS — J449 Chronic obstructive pulmonary disease, unspecified: Secondary | ICD-10-CM | POA: Diagnosis not present

## 2021-11-10 DIAGNOSIS — R1312 Dysphagia, oropharyngeal phase: Secondary | ICD-10-CM | POA: Diagnosis not present

## 2021-11-10 DIAGNOSIS — R279 Unspecified lack of coordination: Secondary | ICD-10-CM | POA: Diagnosis not present

## 2021-11-10 DIAGNOSIS — I5032 Chronic diastolic (congestive) heart failure: Secondary | ICD-10-CM | POA: Diagnosis not present

## 2021-11-11 DIAGNOSIS — I5032 Chronic diastolic (congestive) heart failure: Secondary | ICD-10-CM | POA: Diagnosis not present

## 2021-11-11 DIAGNOSIS — R279 Unspecified lack of coordination: Secondary | ICD-10-CM | POA: Diagnosis not present

## 2021-11-11 DIAGNOSIS — J449 Chronic obstructive pulmonary disease, unspecified: Secondary | ICD-10-CM | POA: Diagnosis not present

## 2021-11-11 DIAGNOSIS — R1312 Dysphagia, oropharyngeal phase: Secondary | ICD-10-CM | POA: Diagnosis not present

## 2021-11-11 DIAGNOSIS — M6281 Muscle weakness (generalized): Secondary | ICD-10-CM | POA: Diagnosis not present

## 2021-11-11 DIAGNOSIS — E785 Hyperlipidemia, unspecified: Secondary | ICD-10-CM | POA: Diagnosis not present

## 2021-11-12 DIAGNOSIS — R1312 Dysphagia, oropharyngeal phase: Secondary | ICD-10-CM | POA: Diagnosis not present

## 2021-11-12 DIAGNOSIS — J449 Chronic obstructive pulmonary disease, unspecified: Secondary | ICD-10-CM | POA: Diagnosis not present

## 2021-11-12 DIAGNOSIS — E785 Hyperlipidemia, unspecified: Secondary | ICD-10-CM | POA: Diagnosis not present

## 2021-11-12 DIAGNOSIS — R279 Unspecified lack of coordination: Secondary | ICD-10-CM | POA: Diagnosis not present

## 2021-11-12 DIAGNOSIS — I5032 Chronic diastolic (congestive) heart failure: Secondary | ICD-10-CM | POA: Diagnosis not present

## 2021-11-12 DIAGNOSIS — M6281 Muscle weakness (generalized): Secondary | ICD-10-CM | POA: Diagnosis not present

## 2021-11-13 DIAGNOSIS — I5032 Chronic diastolic (congestive) heart failure: Secondary | ICD-10-CM | POA: Diagnosis not present

## 2021-11-13 DIAGNOSIS — M6281 Muscle weakness (generalized): Secondary | ICD-10-CM | POA: Diagnosis not present

## 2021-11-13 DIAGNOSIS — R279 Unspecified lack of coordination: Secondary | ICD-10-CM | POA: Diagnosis not present

## 2021-11-13 DIAGNOSIS — E785 Hyperlipidemia, unspecified: Secondary | ICD-10-CM | POA: Diagnosis not present

## 2021-11-13 DIAGNOSIS — J449 Chronic obstructive pulmonary disease, unspecified: Secondary | ICD-10-CM | POA: Diagnosis not present

## 2021-11-13 DIAGNOSIS — R1312 Dysphagia, oropharyngeal phase: Secondary | ICD-10-CM | POA: Diagnosis not present

## 2021-11-30 DIAGNOSIS — F0390 Unspecified dementia without behavioral disturbance: Secondary | ICD-10-CM | POA: Diagnosis not present

## 2021-11-30 DIAGNOSIS — D692 Other nonthrombocytopenic purpura: Secondary | ICD-10-CM | POA: Diagnosis not present

## 2021-11-30 DIAGNOSIS — J449 Chronic obstructive pulmonary disease, unspecified: Secondary | ICD-10-CM | POA: Diagnosis not present

## 2021-11-30 DIAGNOSIS — I5032 Chronic diastolic (congestive) heart failure: Secondary | ICD-10-CM | POA: Diagnosis not present

## 2021-12-13 DIAGNOSIS — R140 Abdominal distension (gaseous): Secondary | ICD-10-CM | POA: Diagnosis not present

## 2021-12-13 DIAGNOSIS — R1110 Vomiting, unspecified: Secondary | ICD-10-CM | POA: Diagnosis not present

## 2021-12-29 DIAGNOSIS — R1312 Dysphagia, oropharyngeal phase: Secondary | ICD-10-CM | POA: Diagnosis not present

## 2021-12-30 DIAGNOSIS — R1312 Dysphagia, oropharyngeal phase: Secondary | ICD-10-CM | POA: Diagnosis not present

## 2021-12-31 DIAGNOSIS — R1312 Dysphagia, oropharyngeal phase: Secondary | ICD-10-CM | POA: Diagnosis not present

## 2022-01-01 DIAGNOSIS — R1312 Dysphagia, oropharyngeal phase: Secondary | ICD-10-CM | POA: Diagnosis not present

## 2022-01-03 DIAGNOSIS — R1312 Dysphagia, oropharyngeal phase: Secondary | ICD-10-CM | POA: Diagnosis not present

## 2022-01-06 DIAGNOSIS — R1312 Dysphagia, oropharyngeal phase: Secondary | ICD-10-CM | POA: Diagnosis not present

## 2022-01-07 DIAGNOSIS — R1312 Dysphagia, oropharyngeal phase: Secondary | ICD-10-CM | POA: Diagnosis not present

## 2022-01-09 DIAGNOSIS — R1312 Dysphagia, oropharyngeal phase: Secondary | ICD-10-CM | POA: Diagnosis not present

## 2022-01-10 DIAGNOSIS — R1312 Dysphagia, oropharyngeal phase: Secondary | ICD-10-CM | POA: Diagnosis not present

## 2022-01-11 DIAGNOSIS — R1312 Dysphagia, oropharyngeal phase: Secondary | ICD-10-CM | POA: Diagnosis not present

## 2022-01-12 DIAGNOSIS — R1312 Dysphagia, oropharyngeal phase: Secondary | ICD-10-CM | POA: Diagnosis not present

## 2022-01-13 DIAGNOSIS — R1312 Dysphagia, oropharyngeal phase: Secondary | ICD-10-CM | POA: Diagnosis not present

## 2022-01-16 DIAGNOSIS — R1312 Dysphagia, oropharyngeal phase: Secondary | ICD-10-CM | POA: Diagnosis not present

## 2022-01-17 DIAGNOSIS — R1312 Dysphagia, oropharyngeal phase: Secondary | ICD-10-CM | POA: Diagnosis not present

## 2022-01-18 DIAGNOSIS — R1312 Dysphagia, oropharyngeal phase: Secondary | ICD-10-CM | POA: Diagnosis not present

## 2022-01-19 DIAGNOSIS — R1312 Dysphagia, oropharyngeal phase: Secondary | ICD-10-CM | POA: Diagnosis not present

## 2022-01-20 DIAGNOSIS — R1312 Dysphagia, oropharyngeal phase: Secondary | ICD-10-CM | POA: Diagnosis not present

## 2022-03-01 DIAGNOSIS — J90 Pleural effusion, not elsewhere classified: Secondary | ICD-10-CM | POA: Diagnosis not present

## 2022-03-01 DIAGNOSIS — R918 Other nonspecific abnormal finding of lung field: Secondary | ICD-10-CM | POA: Diagnosis not present

## 2022-03-01 DIAGNOSIS — R059 Cough, unspecified: Secondary | ICD-10-CM | POA: Diagnosis not present

## 2022-03-06 DIAGNOSIS — I5032 Chronic diastolic (congestive) heart failure: Secondary | ICD-10-CM | POA: Diagnosis not present

## 2022-03-06 DIAGNOSIS — Z9181 History of falling: Secondary | ICD-10-CM | POA: Diagnosis not present

## 2022-03-06 DIAGNOSIS — J449 Chronic obstructive pulmonary disease, unspecified: Secondary | ICD-10-CM | POA: Diagnosis not present

## 2022-03-06 DIAGNOSIS — Z993 Dependence on wheelchair: Secondary | ICD-10-CM | POA: Diagnosis not present

## 2022-03-07 DIAGNOSIS — I5032 Chronic diastolic (congestive) heart failure: Secondary | ICD-10-CM | POA: Diagnosis not present

## 2022-03-07 DIAGNOSIS — Z993 Dependence on wheelchair: Secondary | ICD-10-CM | POA: Diagnosis not present

## 2022-03-07 DIAGNOSIS — J449 Chronic obstructive pulmonary disease, unspecified: Secondary | ICD-10-CM | POA: Diagnosis not present

## 2022-03-07 DIAGNOSIS — Z9181 History of falling: Secondary | ICD-10-CM | POA: Diagnosis not present

## 2022-03-08 DIAGNOSIS — I5032 Chronic diastolic (congestive) heart failure: Secondary | ICD-10-CM | POA: Diagnosis not present

## 2022-03-08 DIAGNOSIS — Z9181 History of falling: Secondary | ICD-10-CM | POA: Diagnosis not present

## 2022-03-08 DIAGNOSIS — Z993 Dependence on wheelchair: Secondary | ICD-10-CM | POA: Diagnosis not present

## 2022-03-08 DIAGNOSIS — J449 Chronic obstructive pulmonary disease, unspecified: Secondary | ICD-10-CM | POA: Diagnosis not present

## 2022-03-09 DIAGNOSIS — Z993 Dependence on wheelchair: Secondary | ICD-10-CM | POA: Diagnosis not present

## 2022-03-09 DIAGNOSIS — Z9181 History of falling: Secondary | ICD-10-CM | POA: Diagnosis not present

## 2022-03-09 DIAGNOSIS — J449 Chronic obstructive pulmonary disease, unspecified: Secondary | ICD-10-CM | POA: Diagnosis not present

## 2022-03-09 DIAGNOSIS — I5032 Chronic diastolic (congestive) heart failure: Secondary | ICD-10-CM | POA: Diagnosis not present

## 2022-03-10 DIAGNOSIS — Z993 Dependence on wheelchair: Secondary | ICD-10-CM | POA: Diagnosis not present

## 2022-03-10 DIAGNOSIS — Z9181 History of falling: Secondary | ICD-10-CM | POA: Diagnosis not present

## 2022-03-10 DIAGNOSIS — I5032 Chronic diastolic (congestive) heart failure: Secondary | ICD-10-CM | POA: Diagnosis not present

## 2022-03-10 DIAGNOSIS — J449 Chronic obstructive pulmonary disease, unspecified: Secondary | ICD-10-CM | POA: Diagnosis not present

## 2022-03-13 DIAGNOSIS — J449 Chronic obstructive pulmonary disease, unspecified: Secondary | ICD-10-CM | POA: Diagnosis not present

## 2022-03-13 DIAGNOSIS — I5032 Chronic diastolic (congestive) heart failure: Secondary | ICD-10-CM | POA: Diagnosis not present

## 2022-03-13 DIAGNOSIS — Z993 Dependence on wheelchair: Secondary | ICD-10-CM | POA: Diagnosis not present

## 2022-03-13 DIAGNOSIS — Z9181 History of falling: Secondary | ICD-10-CM | POA: Diagnosis not present

## 2022-03-14 DIAGNOSIS — I5032 Chronic diastolic (congestive) heart failure: Secondary | ICD-10-CM | POA: Diagnosis not present

## 2022-03-14 DIAGNOSIS — J449 Chronic obstructive pulmonary disease, unspecified: Secondary | ICD-10-CM | POA: Diagnosis not present

## 2022-03-14 DIAGNOSIS — Z9181 History of falling: Secondary | ICD-10-CM | POA: Diagnosis not present

## 2022-03-14 DIAGNOSIS — Z993 Dependence on wheelchair: Secondary | ICD-10-CM | POA: Diagnosis not present

## 2022-03-15 DIAGNOSIS — J449 Chronic obstructive pulmonary disease, unspecified: Secondary | ICD-10-CM | POA: Diagnosis not present

## 2022-03-15 DIAGNOSIS — I5032 Chronic diastolic (congestive) heart failure: Secondary | ICD-10-CM | POA: Diagnosis not present

## 2022-03-15 DIAGNOSIS — Z9181 History of falling: Secondary | ICD-10-CM | POA: Diagnosis not present

## 2022-03-15 DIAGNOSIS — Z993 Dependence on wheelchair: Secondary | ICD-10-CM | POA: Diagnosis not present

## 2022-03-16 DIAGNOSIS — I5032 Chronic diastolic (congestive) heart failure: Secondary | ICD-10-CM | POA: Diagnosis not present

## 2022-03-16 DIAGNOSIS — J449 Chronic obstructive pulmonary disease, unspecified: Secondary | ICD-10-CM | POA: Diagnosis not present

## 2022-03-16 DIAGNOSIS — Z9181 History of falling: Secondary | ICD-10-CM | POA: Diagnosis not present

## 2022-03-16 DIAGNOSIS — Z993 Dependence on wheelchair: Secondary | ICD-10-CM | POA: Diagnosis not present

## 2022-03-17 DIAGNOSIS — Z9181 History of falling: Secondary | ICD-10-CM | POA: Diagnosis not present

## 2022-03-17 DIAGNOSIS — Z993 Dependence on wheelchair: Secondary | ICD-10-CM | POA: Diagnosis not present

## 2022-03-17 DIAGNOSIS — J449 Chronic obstructive pulmonary disease, unspecified: Secondary | ICD-10-CM | POA: Diagnosis not present

## 2022-03-17 DIAGNOSIS — I5032 Chronic diastolic (congestive) heart failure: Secondary | ICD-10-CM | POA: Diagnosis not present

## 2022-03-20 DIAGNOSIS — I5032 Chronic diastolic (congestive) heart failure: Secondary | ICD-10-CM | POA: Diagnosis not present

## 2022-03-20 DIAGNOSIS — Z993 Dependence on wheelchair: Secondary | ICD-10-CM | POA: Diagnosis not present

## 2022-03-20 DIAGNOSIS — J449 Chronic obstructive pulmonary disease, unspecified: Secondary | ICD-10-CM | POA: Diagnosis not present

## 2022-03-20 DIAGNOSIS — Z9181 History of falling: Secondary | ICD-10-CM | POA: Diagnosis not present

## 2022-03-21 DIAGNOSIS — I5032 Chronic diastolic (congestive) heart failure: Secondary | ICD-10-CM | POA: Diagnosis not present

## 2022-03-21 DIAGNOSIS — Z9181 History of falling: Secondary | ICD-10-CM | POA: Diagnosis not present

## 2022-03-21 DIAGNOSIS — J449 Chronic obstructive pulmonary disease, unspecified: Secondary | ICD-10-CM | POA: Diagnosis not present

## 2022-03-21 DIAGNOSIS — Z993 Dependence on wheelchair: Secondary | ICD-10-CM | POA: Diagnosis not present

## 2022-03-22 DIAGNOSIS — J449 Chronic obstructive pulmonary disease, unspecified: Secondary | ICD-10-CM | POA: Diagnosis not present

## 2022-03-22 DIAGNOSIS — I5032 Chronic diastolic (congestive) heart failure: Secondary | ICD-10-CM | POA: Diagnosis not present

## 2022-03-22 DIAGNOSIS — Z993 Dependence on wheelchair: Secondary | ICD-10-CM | POA: Diagnosis not present

## 2022-03-22 DIAGNOSIS — Z9181 History of falling: Secondary | ICD-10-CM | POA: Diagnosis not present

## 2022-03-23 DIAGNOSIS — Z9181 History of falling: Secondary | ICD-10-CM | POA: Diagnosis not present

## 2022-03-23 DIAGNOSIS — J449 Chronic obstructive pulmonary disease, unspecified: Secondary | ICD-10-CM | POA: Diagnosis not present

## 2022-03-23 DIAGNOSIS — Z993 Dependence on wheelchair: Secondary | ICD-10-CM | POA: Diagnosis not present

## 2022-03-23 DIAGNOSIS — I5032 Chronic diastolic (congestive) heart failure: Secondary | ICD-10-CM | POA: Diagnosis not present

## 2022-03-24 DIAGNOSIS — Z9181 History of falling: Secondary | ICD-10-CM | POA: Diagnosis not present

## 2022-03-24 DIAGNOSIS — I5032 Chronic diastolic (congestive) heart failure: Secondary | ICD-10-CM | POA: Diagnosis not present

## 2022-03-24 DIAGNOSIS — Z993 Dependence on wheelchair: Secondary | ICD-10-CM | POA: Diagnosis not present

## 2022-03-24 DIAGNOSIS — J449 Chronic obstructive pulmonary disease, unspecified: Secondary | ICD-10-CM | POA: Diagnosis not present

## 2022-03-27 DIAGNOSIS — Z993 Dependence on wheelchair: Secondary | ICD-10-CM | POA: Diagnosis not present

## 2022-03-27 DIAGNOSIS — Z9181 History of falling: Secondary | ICD-10-CM | POA: Diagnosis not present

## 2022-03-27 DIAGNOSIS — I5032 Chronic diastolic (congestive) heart failure: Secondary | ICD-10-CM | POA: Diagnosis not present

## 2022-03-27 DIAGNOSIS — J449 Chronic obstructive pulmonary disease, unspecified: Secondary | ICD-10-CM | POA: Diagnosis not present

## 2022-03-28 DIAGNOSIS — J449 Chronic obstructive pulmonary disease, unspecified: Secondary | ICD-10-CM | POA: Diagnosis not present

## 2022-03-28 DIAGNOSIS — Z9181 History of falling: Secondary | ICD-10-CM | POA: Diagnosis not present

## 2022-03-28 DIAGNOSIS — Z993 Dependence on wheelchair: Secondary | ICD-10-CM | POA: Diagnosis not present

## 2022-03-28 DIAGNOSIS — I5032 Chronic diastolic (congestive) heart failure: Secondary | ICD-10-CM | POA: Diagnosis not present

## 2022-03-29 DIAGNOSIS — J449 Chronic obstructive pulmonary disease, unspecified: Secondary | ICD-10-CM | POA: Diagnosis not present

## 2022-03-29 DIAGNOSIS — Z9181 History of falling: Secondary | ICD-10-CM | POA: Diagnosis not present

## 2022-03-29 DIAGNOSIS — Z993 Dependence on wheelchair: Secondary | ICD-10-CM | POA: Diagnosis not present

## 2022-03-29 DIAGNOSIS — I5032 Chronic diastolic (congestive) heart failure: Secondary | ICD-10-CM | POA: Diagnosis not present

## 2022-03-30 DIAGNOSIS — Z9181 History of falling: Secondary | ICD-10-CM | POA: Diagnosis not present

## 2022-03-30 DIAGNOSIS — J449 Chronic obstructive pulmonary disease, unspecified: Secondary | ICD-10-CM | POA: Diagnosis not present

## 2022-03-30 DIAGNOSIS — Z993 Dependence on wheelchair: Secondary | ICD-10-CM | POA: Diagnosis not present

## 2022-03-30 DIAGNOSIS — I5032 Chronic diastolic (congestive) heart failure: Secondary | ICD-10-CM | POA: Diagnosis not present

## 2022-03-31 DIAGNOSIS — Z9181 History of falling: Secondary | ICD-10-CM | POA: Diagnosis not present

## 2022-03-31 DIAGNOSIS — I5032 Chronic diastolic (congestive) heart failure: Secondary | ICD-10-CM | POA: Diagnosis not present

## 2022-03-31 DIAGNOSIS — Z993 Dependence on wheelchair: Secondary | ICD-10-CM | POA: Diagnosis not present

## 2022-03-31 DIAGNOSIS — J449 Chronic obstructive pulmonary disease, unspecified: Secondary | ICD-10-CM | POA: Diagnosis not present

## 2022-04-14 DIAGNOSIS — R109 Unspecified abdominal pain: Secondary | ICD-10-CM | POA: Diagnosis not present

## 2022-04-19 DIAGNOSIS — N390 Urinary tract infection, site not specified: Secondary | ICD-10-CM | POA: Diagnosis not present

## 2022-04-26 DIAGNOSIS — R062 Wheezing: Secondary | ICD-10-CM | POA: Diagnosis not present

## 2022-04-26 DIAGNOSIS — R059 Cough, unspecified: Secondary | ICD-10-CM | POA: Diagnosis not present

## 2022-05-05 DIAGNOSIS — R1312 Dysphagia, oropharyngeal phase: Secondary | ICD-10-CM | POA: Diagnosis not present

## 2022-05-06 DIAGNOSIS — R1312 Dysphagia, oropharyngeal phase: Secondary | ICD-10-CM | POA: Diagnosis not present

## 2022-05-08 DIAGNOSIS — R1312 Dysphagia, oropharyngeal phase: Secondary | ICD-10-CM | POA: Diagnosis not present

## 2022-05-10 DIAGNOSIS — R1312 Dysphagia, oropharyngeal phase: Secondary | ICD-10-CM | POA: Diagnosis not present

## 2022-05-11 DIAGNOSIS — R1312 Dysphagia, oropharyngeal phase: Secondary | ICD-10-CM | POA: Diagnosis not present

## 2022-05-12 DIAGNOSIS — R1312 Dysphagia, oropharyngeal phase: Secondary | ICD-10-CM | POA: Diagnosis not present

## 2022-05-15 DIAGNOSIS — R1312 Dysphagia, oropharyngeal phase: Secondary | ICD-10-CM | POA: Diagnosis not present

## 2022-05-16 DIAGNOSIS — R1312 Dysphagia, oropharyngeal phase: Secondary | ICD-10-CM | POA: Diagnosis not present

## 2022-05-17 DIAGNOSIS — R1312 Dysphagia, oropharyngeal phase: Secondary | ICD-10-CM | POA: Diagnosis not present

## 2022-05-18 DIAGNOSIS — R1312 Dysphagia, oropharyngeal phase: Secondary | ICD-10-CM | POA: Diagnosis not present

## 2022-05-19 DIAGNOSIS — R1312 Dysphagia, oropharyngeal phase: Secondary | ICD-10-CM | POA: Diagnosis not present

## 2022-05-22 DIAGNOSIS — R1312 Dysphagia, oropharyngeal phase: Secondary | ICD-10-CM | POA: Diagnosis not present

## 2022-05-23 DIAGNOSIS — R1312 Dysphagia, oropharyngeal phase: Secondary | ICD-10-CM | POA: Diagnosis not present

## 2022-05-24 DIAGNOSIS — R1312 Dysphagia, oropharyngeal phase: Secondary | ICD-10-CM | POA: Diagnosis not present

## 2022-05-25 DIAGNOSIS — R1312 Dysphagia, oropharyngeal phase: Secondary | ICD-10-CM | POA: Diagnosis not present

## 2022-05-26 DIAGNOSIS — R1312 Dysphagia, oropharyngeal phase: Secondary | ICD-10-CM | POA: Diagnosis not present

## 2022-05-27 DIAGNOSIS — R1312 Dysphagia, oropharyngeal phase: Secondary | ICD-10-CM | POA: Diagnosis not present

## 2022-05-28 DIAGNOSIS — R1312 Dysphagia, oropharyngeal phase: Secondary | ICD-10-CM | POA: Diagnosis not present

## 2022-05-29 DIAGNOSIS — R1312 Dysphagia, oropharyngeal phase: Secondary | ICD-10-CM | POA: Diagnosis not present

## 2022-05-30 DIAGNOSIS — R1312 Dysphagia, oropharyngeal phase: Secondary | ICD-10-CM | POA: Diagnosis not present

## 2022-06-23 DIAGNOSIS — E559 Vitamin D deficiency, unspecified: Secondary | ICD-10-CM | POA: Diagnosis not present

## 2022-07-21 DIAGNOSIS — E119 Type 2 diabetes mellitus without complications: Secondary | ICD-10-CM | POA: Diagnosis not present

## 2022-08-23 DIAGNOSIS — I5032 Chronic diastolic (congestive) heart failure: Secondary | ICD-10-CM | POA: Diagnosis not present

## 2022-08-23 DIAGNOSIS — J449 Chronic obstructive pulmonary disease, unspecified: Secondary | ICD-10-CM | POA: Diagnosis not present

## 2022-08-23 DIAGNOSIS — M24562 Contracture, left knee: Secondary | ICD-10-CM | POA: Diagnosis not present

## 2022-08-23 DIAGNOSIS — Z9181 History of falling: Secondary | ICD-10-CM | POA: Diagnosis not present

## 2022-08-23 DIAGNOSIS — M24561 Contracture, right knee: Secondary | ICD-10-CM | POA: Diagnosis not present

## 2022-08-23 DIAGNOSIS — Z993 Dependence on wheelchair: Secondary | ICD-10-CM | POA: Diagnosis not present

## 2022-08-23 DIAGNOSIS — R1312 Dysphagia, oropharyngeal phase: Secondary | ICD-10-CM | POA: Diagnosis not present

## 2022-08-23 DIAGNOSIS — M6281 Muscle weakness (generalized): Secondary | ICD-10-CM | POA: Diagnosis not present

## 2022-08-24 DIAGNOSIS — Z9181 History of falling: Secondary | ICD-10-CM | POA: Diagnosis not present

## 2022-08-24 DIAGNOSIS — J449 Chronic obstructive pulmonary disease, unspecified: Secondary | ICD-10-CM | POA: Diagnosis not present

## 2022-08-24 DIAGNOSIS — M24562 Contracture, left knee: Secondary | ICD-10-CM | POA: Diagnosis not present

## 2022-08-24 DIAGNOSIS — Z993 Dependence on wheelchair: Secondary | ICD-10-CM | POA: Diagnosis not present

## 2022-08-24 DIAGNOSIS — I5032 Chronic diastolic (congestive) heart failure: Secondary | ICD-10-CM | POA: Diagnosis not present

## 2022-08-24 DIAGNOSIS — M6281 Muscle weakness (generalized): Secondary | ICD-10-CM | POA: Diagnosis not present

## 2022-08-24 DIAGNOSIS — R1312 Dysphagia, oropharyngeal phase: Secondary | ICD-10-CM | POA: Diagnosis not present

## 2022-08-24 DIAGNOSIS — M24561 Contracture, right knee: Secondary | ICD-10-CM | POA: Diagnosis not present

## 2022-08-25 DIAGNOSIS — J449 Chronic obstructive pulmonary disease, unspecified: Secondary | ICD-10-CM | POA: Diagnosis not present

## 2022-08-25 DIAGNOSIS — M24562 Contracture, left knee: Secondary | ICD-10-CM | POA: Diagnosis not present

## 2022-08-25 DIAGNOSIS — R1312 Dysphagia, oropharyngeal phase: Secondary | ICD-10-CM | POA: Diagnosis not present

## 2022-08-25 DIAGNOSIS — I5032 Chronic diastolic (congestive) heart failure: Secondary | ICD-10-CM | POA: Diagnosis not present

## 2022-08-25 DIAGNOSIS — Z9181 History of falling: Secondary | ICD-10-CM | POA: Diagnosis not present

## 2022-08-25 DIAGNOSIS — M6281 Muscle weakness (generalized): Secondary | ICD-10-CM | POA: Diagnosis not present

## 2022-08-25 DIAGNOSIS — M24561 Contracture, right knee: Secondary | ICD-10-CM | POA: Diagnosis not present

## 2022-08-25 DIAGNOSIS — Z993 Dependence on wheelchair: Secondary | ICD-10-CM | POA: Diagnosis not present

## 2022-08-28 DIAGNOSIS — I5032 Chronic diastolic (congestive) heart failure: Secondary | ICD-10-CM | POA: Diagnosis not present

## 2022-08-28 DIAGNOSIS — Z993 Dependence on wheelchair: Secondary | ICD-10-CM | POA: Diagnosis not present

## 2022-08-28 DIAGNOSIS — Z9181 History of falling: Secondary | ICD-10-CM | POA: Diagnosis not present

## 2022-08-28 DIAGNOSIS — R1312 Dysphagia, oropharyngeal phase: Secondary | ICD-10-CM | POA: Diagnosis not present

## 2022-08-28 DIAGNOSIS — M24561 Contracture, right knee: Secondary | ICD-10-CM | POA: Diagnosis not present

## 2022-08-28 DIAGNOSIS — M6281 Muscle weakness (generalized): Secondary | ICD-10-CM | POA: Diagnosis not present

## 2022-08-28 DIAGNOSIS — J449 Chronic obstructive pulmonary disease, unspecified: Secondary | ICD-10-CM | POA: Diagnosis not present

## 2022-08-28 DIAGNOSIS — M24562 Contracture, left knee: Secondary | ICD-10-CM | POA: Diagnosis not present

## 2022-08-29 DIAGNOSIS — I5032 Chronic diastolic (congestive) heart failure: Secondary | ICD-10-CM | POA: Diagnosis not present

## 2022-08-29 DIAGNOSIS — R1312 Dysphagia, oropharyngeal phase: Secondary | ICD-10-CM | POA: Diagnosis not present

## 2022-08-29 DIAGNOSIS — Z9181 History of falling: Secondary | ICD-10-CM | POA: Diagnosis not present

## 2022-08-29 DIAGNOSIS — M24561 Contracture, right knee: Secondary | ICD-10-CM | POA: Diagnosis not present

## 2022-08-29 DIAGNOSIS — M24562 Contracture, left knee: Secondary | ICD-10-CM | POA: Diagnosis not present

## 2022-08-29 DIAGNOSIS — J449 Chronic obstructive pulmonary disease, unspecified: Secondary | ICD-10-CM | POA: Diagnosis not present

## 2022-08-29 DIAGNOSIS — M6281 Muscle weakness (generalized): Secondary | ICD-10-CM | POA: Diagnosis not present

## 2022-08-29 DIAGNOSIS — Z993 Dependence on wheelchair: Secondary | ICD-10-CM | POA: Diagnosis not present

## 2022-08-30 DIAGNOSIS — Z9181 History of falling: Secondary | ICD-10-CM | POA: Diagnosis not present

## 2022-08-30 DIAGNOSIS — Z993 Dependence on wheelchair: Secondary | ICD-10-CM | POA: Diagnosis not present

## 2022-08-30 DIAGNOSIS — M6281 Muscle weakness (generalized): Secondary | ICD-10-CM | POA: Diagnosis not present

## 2022-08-30 DIAGNOSIS — R1312 Dysphagia, oropharyngeal phase: Secondary | ICD-10-CM | POA: Diagnosis not present

## 2022-08-30 DIAGNOSIS — J449 Chronic obstructive pulmonary disease, unspecified: Secondary | ICD-10-CM | POA: Diagnosis not present

## 2022-08-30 DIAGNOSIS — M24561 Contracture, right knee: Secondary | ICD-10-CM | POA: Diagnosis not present

## 2022-08-30 DIAGNOSIS — M24562 Contracture, left knee: Secondary | ICD-10-CM | POA: Diagnosis not present

## 2022-08-30 DIAGNOSIS — I5032 Chronic diastolic (congestive) heart failure: Secondary | ICD-10-CM | POA: Diagnosis not present

## 2022-08-31 DIAGNOSIS — M24562 Contracture, left knee: Secondary | ICD-10-CM | POA: Diagnosis not present

## 2022-08-31 DIAGNOSIS — I5032 Chronic diastolic (congestive) heart failure: Secondary | ICD-10-CM | POA: Diagnosis not present

## 2022-08-31 DIAGNOSIS — R1312 Dysphagia, oropharyngeal phase: Secondary | ICD-10-CM | POA: Diagnosis not present

## 2022-08-31 DIAGNOSIS — Z9181 History of falling: Secondary | ICD-10-CM | POA: Diagnosis not present

## 2022-08-31 DIAGNOSIS — M24561 Contracture, right knee: Secondary | ICD-10-CM | POA: Diagnosis not present

## 2022-08-31 DIAGNOSIS — J449 Chronic obstructive pulmonary disease, unspecified: Secondary | ICD-10-CM | POA: Diagnosis not present

## 2022-08-31 DIAGNOSIS — M6281 Muscle weakness (generalized): Secondary | ICD-10-CM | POA: Diagnosis not present

## 2022-08-31 DIAGNOSIS — Z993 Dependence on wheelchair: Secondary | ICD-10-CM | POA: Diagnosis not present

## 2022-09-01 DIAGNOSIS — I5032 Chronic diastolic (congestive) heart failure: Secondary | ICD-10-CM | POA: Diagnosis not present

## 2022-09-01 DIAGNOSIS — M6281 Muscle weakness (generalized): Secondary | ICD-10-CM | POA: Diagnosis not present

## 2022-09-01 DIAGNOSIS — J449 Chronic obstructive pulmonary disease, unspecified: Secondary | ICD-10-CM | POA: Diagnosis not present

## 2022-09-01 DIAGNOSIS — M24562 Contracture, left knee: Secondary | ICD-10-CM | POA: Diagnosis not present

## 2022-09-01 DIAGNOSIS — R1312 Dysphagia, oropharyngeal phase: Secondary | ICD-10-CM | POA: Diagnosis not present

## 2022-09-01 DIAGNOSIS — M24561 Contracture, right knee: Secondary | ICD-10-CM | POA: Diagnosis not present

## 2022-09-01 DIAGNOSIS — Z9181 History of falling: Secondary | ICD-10-CM | POA: Diagnosis not present

## 2022-09-01 DIAGNOSIS — Z993 Dependence on wheelchair: Secondary | ICD-10-CM | POA: Diagnosis not present

## 2022-09-04 DIAGNOSIS — M24562 Contracture, left knee: Secondary | ICD-10-CM | POA: Diagnosis not present

## 2022-09-04 DIAGNOSIS — M6281 Muscle weakness (generalized): Secondary | ICD-10-CM | POA: Diagnosis not present

## 2022-09-04 DIAGNOSIS — Z9181 History of falling: Secondary | ICD-10-CM | POA: Diagnosis not present

## 2022-09-04 DIAGNOSIS — J449 Chronic obstructive pulmonary disease, unspecified: Secondary | ICD-10-CM | POA: Diagnosis not present

## 2022-09-04 DIAGNOSIS — M24561 Contracture, right knee: Secondary | ICD-10-CM | POA: Diagnosis not present

## 2022-09-04 DIAGNOSIS — R1312 Dysphagia, oropharyngeal phase: Secondary | ICD-10-CM | POA: Diagnosis not present

## 2022-09-04 DIAGNOSIS — I5032 Chronic diastolic (congestive) heart failure: Secondary | ICD-10-CM | POA: Diagnosis not present

## 2022-09-04 DIAGNOSIS — Z993 Dependence on wheelchair: Secondary | ICD-10-CM | POA: Diagnosis not present

## 2022-09-05 DIAGNOSIS — J449 Chronic obstructive pulmonary disease, unspecified: Secondary | ICD-10-CM | POA: Diagnosis not present

## 2022-09-05 DIAGNOSIS — I5032 Chronic diastolic (congestive) heart failure: Secondary | ICD-10-CM | POA: Diagnosis not present

## 2022-09-05 DIAGNOSIS — Z993 Dependence on wheelchair: Secondary | ICD-10-CM | POA: Diagnosis not present

## 2022-09-05 DIAGNOSIS — M24561 Contracture, right knee: Secondary | ICD-10-CM | POA: Diagnosis not present

## 2022-09-05 DIAGNOSIS — M24562 Contracture, left knee: Secondary | ICD-10-CM | POA: Diagnosis not present

## 2022-09-05 DIAGNOSIS — R1312 Dysphagia, oropharyngeal phase: Secondary | ICD-10-CM | POA: Diagnosis not present

## 2022-09-05 DIAGNOSIS — M6281 Muscle weakness (generalized): Secondary | ICD-10-CM | POA: Diagnosis not present

## 2022-09-05 DIAGNOSIS — Z9181 History of falling: Secondary | ICD-10-CM | POA: Diagnosis not present

## 2022-09-06 DIAGNOSIS — J449 Chronic obstructive pulmonary disease, unspecified: Secondary | ICD-10-CM | POA: Diagnosis not present

## 2022-09-06 DIAGNOSIS — M6281 Muscle weakness (generalized): Secondary | ICD-10-CM | POA: Diagnosis not present

## 2022-09-06 DIAGNOSIS — I5032 Chronic diastolic (congestive) heart failure: Secondary | ICD-10-CM | POA: Diagnosis not present

## 2022-09-06 DIAGNOSIS — Z993 Dependence on wheelchair: Secondary | ICD-10-CM | POA: Diagnosis not present

## 2022-09-06 DIAGNOSIS — M24561 Contracture, right knee: Secondary | ICD-10-CM | POA: Diagnosis not present

## 2022-09-06 DIAGNOSIS — R1312 Dysphagia, oropharyngeal phase: Secondary | ICD-10-CM | POA: Diagnosis not present

## 2022-09-06 DIAGNOSIS — Z9181 History of falling: Secondary | ICD-10-CM | POA: Diagnosis not present

## 2022-09-06 DIAGNOSIS — M24562 Contracture, left knee: Secondary | ICD-10-CM | POA: Diagnosis not present

## 2022-09-07 DIAGNOSIS — M6281 Muscle weakness (generalized): Secondary | ICD-10-CM | POA: Diagnosis not present

## 2022-09-07 DIAGNOSIS — M24561 Contracture, right knee: Secondary | ICD-10-CM | POA: Diagnosis not present

## 2022-09-07 DIAGNOSIS — R1312 Dysphagia, oropharyngeal phase: Secondary | ICD-10-CM | POA: Diagnosis not present

## 2022-09-07 DIAGNOSIS — I5032 Chronic diastolic (congestive) heart failure: Secondary | ICD-10-CM | POA: Diagnosis not present

## 2022-09-07 DIAGNOSIS — E559 Vitamin D deficiency, unspecified: Secondary | ICD-10-CM | POA: Diagnosis not present

## 2022-09-07 DIAGNOSIS — J449 Chronic obstructive pulmonary disease, unspecified: Secondary | ICD-10-CM | POA: Diagnosis not present

## 2022-09-07 DIAGNOSIS — Z9181 History of falling: Secondary | ICD-10-CM | POA: Diagnosis not present

## 2022-09-07 DIAGNOSIS — M24562 Contracture, left knee: Secondary | ICD-10-CM | POA: Diagnosis not present

## 2022-09-07 DIAGNOSIS — Z993 Dependence on wheelchair: Secondary | ICD-10-CM | POA: Diagnosis not present

## 2022-09-08 DIAGNOSIS — J449 Chronic obstructive pulmonary disease, unspecified: Secondary | ICD-10-CM | POA: Diagnosis not present

## 2022-09-08 DIAGNOSIS — Z993 Dependence on wheelchair: Secondary | ICD-10-CM | POA: Diagnosis not present

## 2022-09-08 DIAGNOSIS — M6281 Muscle weakness (generalized): Secondary | ICD-10-CM | POA: Diagnosis not present

## 2022-09-08 DIAGNOSIS — R1312 Dysphagia, oropharyngeal phase: Secondary | ICD-10-CM | POA: Diagnosis not present

## 2022-09-08 DIAGNOSIS — Z9181 History of falling: Secondary | ICD-10-CM | POA: Diagnosis not present

## 2022-09-08 DIAGNOSIS — M24562 Contracture, left knee: Secondary | ICD-10-CM | POA: Diagnosis not present

## 2022-09-08 DIAGNOSIS — M24561 Contracture, right knee: Secondary | ICD-10-CM | POA: Diagnosis not present

## 2022-09-08 DIAGNOSIS — I5032 Chronic diastolic (congestive) heart failure: Secondary | ICD-10-CM | POA: Diagnosis not present

## 2022-09-11 DIAGNOSIS — Z993 Dependence on wheelchair: Secondary | ICD-10-CM | POA: Diagnosis not present

## 2022-09-11 DIAGNOSIS — J449 Chronic obstructive pulmonary disease, unspecified: Secondary | ICD-10-CM | POA: Diagnosis not present

## 2022-09-11 DIAGNOSIS — Z9181 History of falling: Secondary | ICD-10-CM | POA: Diagnosis not present

## 2022-09-11 DIAGNOSIS — M24561 Contracture, right knee: Secondary | ICD-10-CM | POA: Diagnosis not present

## 2022-09-11 DIAGNOSIS — R1312 Dysphagia, oropharyngeal phase: Secondary | ICD-10-CM | POA: Diagnosis not present

## 2022-09-11 DIAGNOSIS — M6281 Muscle weakness (generalized): Secondary | ICD-10-CM | POA: Diagnosis not present

## 2022-09-11 DIAGNOSIS — M24562 Contracture, left knee: Secondary | ICD-10-CM | POA: Diagnosis not present

## 2022-09-11 DIAGNOSIS — I5032 Chronic diastolic (congestive) heart failure: Secondary | ICD-10-CM | POA: Diagnosis not present

## 2022-09-12 DIAGNOSIS — M24561 Contracture, right knee: Secondary | ICD-10-CM | POA: Diagnosis not present

## 2022-09-12 DIAGNOSIS — R1312 Dysphagia, oropharyngeal phase: Secondary | ICD-10-CM | POA: Diagnosis not present

## 2022-09-12 DIAGNOSIS — Z23 Encounter for immunization: Secondary | ICD-10-CM | POA: Diagnosis not present

## 2022-09-12 DIAGNOSIS — I5032 Chronic diastolic (congestive) heart failure: Secondary | ICD-10-CM | POA: Diagnosis not present

## 2022-09-12 DIAGNOSIS — M24562 Contracture, left knee: Secondary | ICD-10-CM | POA: Diagnosis not present

## 2022-09-12 DIAGNOSIS — J449 Chronic obstructive pulmonary disease, unspecified: Secondary | ICD-10-CM | POA: Diagnosis not present

## 2022-09-12 DIAGNOSIS — M6281 Muscle weakness (generalized): Secondary | ICD-10-CM | POA: Diagnosis not present

## 2022-09-12 DIAGNOSIS — Z9181 History of falling: Secondary | ICD-10-CM | POA: Diagnosis not present

## 2022-09-12 DIAGNOSIS — Z993 Dependence on wheelchair: Secondary | ICD-10-CM | POA: Diagnosis not present

## 2022-09-13 DIAGNOSIS — M24561 Contracture, right knee: Secondary | ICD-10-CM | POA: Diagnosis not present

## 2022-09-13 DIAGNOSIS — I5032 Chronic diastolic (congestive) heart failure: Secondary | ICD-10-CM | POA: Diagnosis not present

## 2022-09-13 DIAGNOSIS — Z993 Dependence on wheelchair: Secondary | ICD-10-CM | POA: Diagnosis not present

## 2022-09-13 DIAGNOSIS — M24562 Contracture, left knee: Secondary | ICD-10-CM | POA: Diagnosis not present

## 2022-09-13 DIAGNOSIS — Z9181 History of falling: Secondary | ICD-10-CM | POA: Diagnosis not present

## 2022-09-13 DIAGNOSIS — R1312 Dysphagia, oropharyngeal phase: Secondary | ICD-10-CM | POA: Diagnosis not present

## 2022-09-13 DIAGNOSIS — J449 Chronic obstructive pulmonary disease, unspecified: Secondary | ICD-10-CM | POA: Diagnosis not present

## 2022-09-13 DIAGNOSIS — M6281 Muscle weakness (generalized): Secondary | ICD-10-CM | POA: Diagnosis not present

## 2022-09-21 DIAGNOSIS — E119 Type 2 diabetes mellitus without complications: Secondary | ICD-10-CM | POA: Diagnosis not present

## 2022-10-07 DIAGNOSIS — R279 Unspecified lack of coordination: Secondary | ICD-10-CM | POA: Diagnosis not present

## 2022-10-07 DIAGNOSIS — E785 Hyperlipidemia, unspecified: Secondary | ICD-10-CM | POA: Diagnosis not present

## 2022-10-07 DIAGNOSIS — M6281 Muscle weakness (generalized): Secondary | ICD-10-CM | POA: Diagnosis not present

## 2022-10-07 DIAGNOSIS — I5032 Chronic diastolic (congestive) heart failure: Secondary | ICD-10-CM | POA: Diagnosis not present

## 2022-10-07 DIAGNOSIS — R1312 Dysphagia, oropharyngeal phase: Secondary | ICD-10-CM | POA: Diagnosis not present

## 2022-10-08 DIAGNOSIS — M6281 Muscle weakness (generalized): Secondary | ICD-10-CM | POA: Diagnosis not present

## 2022-10-08 DIAGNOSIS — R279 Unspecified lack of coordination: Secondary | ICD-10-CM | POA: Diagnosis not present

## 2022-10-08 DIAGNOSIS — R1312 Dysphagia, oropharyngeal phase: Secondary | ICD-10-CM | POA: Diagnosis not present

## 2022-10-08 DIAGNOSIS — I5032 Chronic diastolic (congestive) heart failure: Secondary | ICD-10-CM | POA: Diagnosis not present

## 2022-10-08 DIAGNOSIS — E785 Hyperlipidemia, unspecified: Secondary | ICD-10-CM | POA: Diagnosis not present

## 2022-10-09 DIAGNOSIS — R1312 Dysphagia, oropharyngeal phase: Secondary | ICD-10-CM | POA: Diagnosis not present

## 2022-10-09 DIAGNOSIS — R279 Unspecified lack of coordination: Secondary | ICD-10-CM | POA: Diagnosis not present

## 2022-10-09 DIAGNOSIS — I5032 Chronic diastolic (congestive) heart failure: Secondary | ICD-10-CM | POA: Diagnosis not present

## 2022-10-09 DIAGNOSIS — M6281 Muscle weakness (generalized): Secondary | ICD-10-CM | POA: Diagnosis not present

## 2022-10-09 DIAGNOSIS — E785 Hyperlipidemia, unspecified: Secondary | ICD-10-CM | POA: Diagnosis not present

## 2022-10-10 DIAGNOSIS — E785 Hyperlipidemia, unspecified: Secondary | ICD-10-CM | POA: Diagnosis not present

## 2022-10-10 DIAGNOSIS — R279 Unspecified lack of coordination: Secondary | ICD-10-CM | POA: Diagnosis not present

## 2022-10-10 DIAGNOSIS — I5032 Chronic diastolic (congestive) heart failure: Secondary | ICD-10-CM | POA: Diagnosis not present

## 2022-10-10 DIAGNOSIS — M6281 Muscle weakness (generalized): Secondary | ICD-10-CM | POA: Diagnosis not present

## 2022-10-10 DIAGNOSIS — R1312 Dysphagia, oropharyngeal phase: Secondary | ICD-10-CM | POA: Diagnosis not present

## 2022-10-11 DIAGNOSIS — M6281 Muscle weakness (generalized): Secondary | ICD-10-CM | POA: Diagnosis not present

## 2022-10-11 DIAGNOSIS — I5032 Chronic diastolic (congestive) heart failure: Secondary | ICD-10-CM | POA: Diagnosis not present

## 2022-10-11 DIAGNOSIS — R1312 Dysphagia, oropharyngeal phase: Secondary | ICD-10-CM | POA: Diagnosis not present

## 2022-10-11 DIAGNOSIS — R279 Unspecified lack of coordination: Secondary | ICD-10-CM | POA: Diagnosis not present

## 2022-10-11 DIAGNOSIS — E785 Hyperlipidemia, unspecified: Secondary | ICD-10-CM | POA: Diagnosis not present

## 2022-10-12 DIAGNOSIS — E785 Hyperlipidemia, unspecified: Secondary | ICD-10-CM | POA: Diagnosis not present

## 2022-10-12 DIAGNOSIS — R1312 Dysphagia, oropharyngeal phase: Secondary | ICD-10-CM | POA: Diagnosis not present

## 2022-10-12 DIAGNOSIS — R279 Unspecified lack of coordination: Secondary | ICD-10-CM | POA: Diagnosis not present

## 2022-10-12 DIAGNOSIS — M6281 Muscle weakness (generalized): Secondary | ICD-10-CM | POA: Diagnosis not present

## 2022-10-12 DIAGNOSIS — I5032 Chronic diastolic (congestive) heart failure: Secondary | ICD-10-CM | POA: Diagnosis not present

## 2022-10-13 DIAGNOSIS — I5032 Chronic diastolic (congestive) heart failure: Secondary | ICD-10-CM | POA: Diagnosis not present

## 2022-10-13 DIAGNOSIS — R279 Unspecified lack of coordination: Secondary | ICD-10-CM | POA: Diagnosis not present

## 2022-10-13 DIAGNOSIS — E785 Hyperlipidemia, unspecified: Secondary | ICD-10-CM | POA: Diagnosis not present

## 2022-10-13 DIAGNOSIS — M6281 Muscle weakness (generalized): Secondary | ICD-10-CM | POA: Diagnosis not present

## 2022-10-13 DIAGNOSIS — R1312 Dysphagia, oropharyngeal phase: Secondary | ICD-10-CM | POA: Diagnosis not present

## 2022-10-14 DIAGNOSIS — R1312 Dysphagia, oropharyngeal phase: Secondary | ICD-10-CM | POA: Diagnosis not present

## 2022-10-14 DIAGNOSIS — I5032 Chronic diastolic (congestive) heart failure: Secondary | ICD-10-CM | POA: Diagnosis not present

## 2022-10-14 DIAGNOSIS — E785 Hyperlipidemia, unspecified: Secondary | ICD-10-CM | POA: Diagnosis not present

## 2022-10-14 DIAGNOSIS — M6281 Muscle weakness (generalized): Secondary | ICD-10-CM | POA: Diagnosis not present

## 2022-10-14 DIAGNOSIS — R279 Unspecified lack of coordination: Secondary | ICD-10-CM | POA: Diagnosis not present

## 2022-10-15 DIAGNOSIS — I5032 Chronic diastolic (congestive) heart failure: Secondary | ICD-10-CM | POA: Diagnosis not present

## 2022-10-15 DIAGNOSIS — R279 Unspecified lack of coordination: Secondary | ICD-10-CM | POA: Diagnosis not present

## 2022-10-15 DIAGNOSIS — E785 Hyperlipidemia, unspecified: Secondary | ICD-10-CM | POA: Diagnosis not present

## 2022-10-15 DIAGNOSIS — R1312 Dysphagia, oropharyngeal phase: Secondary | ICD-10-CM | POA: Diagnosis not present

## 2022-10-15 DIAGNOSIS — M6281 Muscle weakness (generalized): Secondary | ICD-10-CM | POA: Diagnosis not present

## 2022-10-16 DIAGNOSIS — I5032 Chronic diastolic (congestive) heart failure: Secondary | ICD-10-CM | POA: Diagnosis not present

## 2022-10-16 DIAGNOSIS — M6281 Muscle weakness (generalized): Secondary | ICD-10-CM | POA: Diagnosis not present

## 2022-10-16 DIAGNOSIS — E785 Hyperlipidemia, unspecified: Secondary | ICD-10-CM | POA: Diagnosis not present

## 2022-10-16 DIAGNOSIS — R279 Unspecified lack of coordination: Secondary | ICD-10-CM | POA: Diagnosis not present

## 2022-10-16 DIAGNOSIS — R1312 Dysphagia, oropharyngeal phase: Secondary | ICD-10-CM | POA: Diagnosis not present

## 2022-10-17 DIAGNOSIS — M6281 Muscle weakness (generalized): Secondary | ICD-10-CM | POA: Diagnosis not present

## 2022-10-17 DIAGNOSIS — R1312 Dysphagia, oropharyngeal phase: Secondary | ICD-10-CM | POA: Diagnosis not present

## 2022-10-17 DIAGNOSIS — I5032 Chronic diastolic (congestive) heart failure: Secondary | ICD-10-CM | POA: Diagnosis not present

## 2022-10-17 DIAGNOSIS — R279 Unspecified lack of coordination: Secondary | ICD-10-CM | POA: Diagnosis not present

## 2022-10-17 DIAGNOSIS — E785 Hyperlipidemia, unspecified: Secondary | ICD-10-CM | POA: Diagnosis not present

## 2022-10-18 DIAGNOSIS — R1312 Dysphagia, oropharyngeal phase: Secondary | ICD-10-CM | POA: Diagnosis not present

## 2022-10-18 DIAGNOSIS — M6281 Muscle weakness (generalized): Secondary | ICD-10-CM | POA: Diagnosis not present

## 2022-10-18 DIAGNOSIS — R279 Unspecified lack of coordination: Secondary | ICD-10-CM | POA: Diagnosis not present

## 2022-10-18 DIAGNOSIS — I5032 Chronic diastolic (congestive) heart failure: Secondary | ICD-10-CM | POA: Diagnosis not present

## 2022-10-18 DIAGNOSIS — E785 Hyperlipidemia, unspecified: Secondary | ICD-10-CM | POA: Diagnosis not present

## 2022-10-19 DIAGNOSIS — R1312 Dysphagia, oropharyngeal phase: Secondary | ICD-10-CM | POA: Diagnosis not present

## 2022-10-19 DIAGNOSIS — R279 Unspecified lack of coordination: Secondary | ICD-10-CM | POA: Diagnosis not present

## 2022-10-19 DIAGNOSIS — I5032 Chronic diastolic (congestive) heart failure: Secondary | ICD-10-CM | POA: Diagnosis not present

## 2022-10-19 DIAGNOSIS — M6281 Muscle weakness (generalized): Secondary | ICD-10-CM | POA: Diagnosis not present

## 2022-10-19 DIAGNOSIS — E785 Hyperlipidemia, unspecified: Secondary | ICD-10-CM | POA: Diagnosis not present

## 2022-10-20 DIAGNOSIS — R1312 Dysphagia, oropharyngeal phase: Secondary | ICD-10-CM | POA: Diagnosis not present

## 2022-10-20 DIAGNOSIS — I5032 Chronic diastolic (congestive) heart failure: Secondary | ICD-10-CM | POA: Diagnosis not present

## 2022-10-20 DIAGNOSIS — E785 Hyperlipidemia, unspecified: Secondary | ICD-10-CM | POA: Diagnosis not present

## 2022-10-20 DIAGNOSIS — R279 Unspecified lack of coordination: Secondary | ICD-10-CM | POA: Diagnosis not present

## 2022-10-20 DIAGNOSIS — M6281 Muscle weakness (generalized): Secondary | ICD-10-CM | POA: Diagnosis not present

## 2022-10-23 DIAGNOSIS — I5032 Chronic diastolic (congestive) heart failure: Secondary | ICD-10-CM | POA: Diagnosis not present

## 2022-10-23 DIAGNOSIS — R1312 Dysphagia, oropharyngeal phase: Secondary | ICD-10-CM | POA: Diagnosis not present

## 2022-10-23 DIAGNOSIS — R279 Unspecified lack of coordination: Secondary | ICD-10-CM | POA: Diagnosis not present

## 2022-10-23 DIAGNOSIS — M6281 Muscle weakness (generalized): Secondary | ICD-10-CM | POA: Diagnosis not present

## 2022-10-23 DIAGNOSIS — E785 Hyperlipidemia, unspecified: Secondary | ICD-10-CM | POA: Diagnosis not present

## 2022-10-24 DIAGNOSIS — M6281 Muscle weakness (generalized): Secondary | ICD-10-CM | POA: Diagnosis not present

## 2022-10-24 DIAGNOSIS — R279 Unspecified lack of coordination: Secondary | ICD-10-CM | POA: Diagnosis not present

## 2022-10-24 DIAGNOSIS — I5032 Chronic diastolic (congestive) heart failure: Secondary | ICD-10-CM | POA: Diagnosis not present

## 2022-10-24 DIAGNOSIS — E785 Hyperlipidemia, unspecified: Secondary | ICD-10-CM | POA: Diagnosis not present

## 2022-10-24 DIAGNOSIS — R1312 Dysphagia, oropharyngeal phase: Secondary | ICD-10-CM | POA: Diagnosis not present

## 2022-10-25 DIAGNOSIS — E785 Hyperlipidemia, unspecified: Secondary | ICD-10-CM | POA: Diagnosis not present

## 2022-10-25 DIAGNOSIS — R279 Unspecified lack of coordination: Secondary | ICD-10-CM | POA: Diagnosis not present

## 2022-10-25 DIAGNOSIS — M6281 Muscle weakness (generalized): Secondary | ICD-10-CM | POA: Diagnosis not present

## 2022-10-25 DIAGNOSIS — I5032 Chronic diastolic (congestive) heart failure: Secondary | ICD-10-CM | POA: Diagnosis not present

## 2022-10-25 DIAGNOSIS — R1312 Dysphagia, oropharyngeal phase: Secondary | ICD-10-CM | POA: Diagnosis not present

## 2022-10-26 DIAGNOSIS — I5032 Chronic diastolic (congestive) heart failure: Secondary | ICD-10-CM | POA: Diagnosis not present

## 2022-10-26 DIAGNOSIS — R1312 Dysphagia, oropharyngeal phase: Secondary | ICD-10-CM | POA: Diagnosis not present

## 2022-10-26 DIAGNOSIS — R279 Unspecified lack of coordination: Secondary | ICD-10-CM | POA: Diagnosis not present

## 2022-10-26 DIAGNOSIS — M6281 Muscle weakness (generalized): Secondary | ICD-10-CM | POA: Diagnosis not present

## 2022-10-26 DIAGNOSIS — E785 Hyperlipidemia, unspecified: Secondary | ICD-10-CM | POA: Diagnosis not present

## 2022-10-27 DIAGNOSIS — E785 Hyperlipidemia, unspecified: Secondary | ICD-10-CM | POA: Diagnosis not present

## 2022-10-27 DIAGNOSIS — I5032 Chronic diastolic (congestive) heart failure: Secondary | ICD-10-CM | POA: Diagnosis not present

## 2022-10-27 DIAGNOSIS — R279 Unspecified lack of coordination: Secondary | ICD-10-CM | POA: Diagnosis not present

## 2022-10-27 DIAGNOSIS — R1312 Dysphagia, oropharyngeal phase: Secondary | ICD-10-CM | POA: Diagnosis not present

## 2022-10-27 DIAGNOSIS — M6281 Muscle weakness (generalized): Secondary | ICD-10-CM | POA: Diagnosis not present

## 2022-10-29 DIAGNOSIS — M6281 Muscle weakness (generalized): Secondary | ICD-10-CM | POA: Diagnosis not present

## 2022-10-29 DIAGNOSIS — R279 Unspecified lack of coordination: Secondary | ICD-10-CM | POA: Diagnosis not present

## 2022-10-29 DIAGNOSIS — E785 Hyperlipidemia, unspecified: Secondary | ICD-10-CM | POA: Diagnosis not present

## 2022-10-29 DIAGNOSIS — R1312 Dysphagia, oropharyngeal phase: Secondary | ICD-10-CM | POA: Diagnosis not present

## 2022-10-29 DIAGNOSIS — I5032 Chronic diastolic (congestive) heart failure: Secondary | ICD-10-CM | POA: Diagnosis not present

## 2022-10-30 DIAGNOSIS — R1312 Dysphagia, oropharyngeal phase: Secondary | ICD-10-CM | POA: Diagnosis not present

## 2022-10-30 DIAGNOSIS — R279 Unspecified lack of coordination: Secondary | ICD-10-CM | POA: Diagnosis not present

## 2022-10-30 DIAGNOSIS — M6281 Muscle weakness (generalized): Secondary | ICD-10-CM | POA: Diagnosis not present

## 2022-10-30 DIAGNOSIS — I5032 Chronic diastolic (congestive) heart failure: Secondary | ICD-10-CM | POA: Diagnosis not present

## 2022-10-30 DIAGNOSIS — E785 Hyperlipidemia, unspecified: Secondary | ICD-10-CM | POA: Diagnosis not present

## 2022-11-07 DIAGNOSIS — J441 Chronic obstructive pulmonary disease with (acute) exacerbation: Secondary | ICD-10-CM | POA: Diagnosis not present

## 2022-11-07 DIAGNOSIS — G309 Alzheimer's disease, unspecified: Secondary | ICD-10-CM | POA: Diagnosis not present

## 2022-11-07 DIAGNOSIS — E1122 Type 2 diabetes mellitus with diabetic chronic kidney disease: Secondary | ICD-10-CM | POA: Diagnosis not present

## 2022-11-07 DIAGNOSIS — I1310 Hypertensive heart and chronic kidney disease without heart failure, with stage 1 through stage 4 chronic kidney disease, or unspecified chronic kidney disease: Secondary | ICD-10-CM | POA: Diagnosis not present

## 2022-12-23 DIAGNOSIS — R1312 Dysphagia, oropharyngeal phase: Secondary | ICD-10-CM | POA: Diagnosis not present

## 2022-12-25 DIAGNOSIS — R1312 Dysphagia, oropharyngeal phase: Secondary | ICD-10-CM | POA: Diagnosis not present

## 2022-12-27 DIAGNOSIS — R1312 Dysphagia, oropharyngeal phase: Secondary | ICD-10-CM | POA: Diagnosis not present

## 2022-12-29 DIAGNOSIS — R1312 Dysphagia, oropharyngeal phase: Secondary | ICD-10-CM | POA: Diagnosis not present

## 2023-01-03 DIAGNOSIS — R1312 Dysphagia, oropharyngeal phase: Secondary | ICD-10-CM | POA: Diagnosis not present

## 2023-01-04 DIAGNOSIS — I1310 Hypertensive heart and chronic kidney disease without heart failure, with stage 1 through stage 4 chronic kidney disease, or unspecified chronic kidney disease: Secondary | ICD-10-CM | POA: Diagnosis not present

## 2023-01-04 DIAGNOSIS — G309 Alzheimer's disease, unspecified: Secondary | ICD-10-CM | POA: Diagnosis not present

## 2023-01-04 DIAGNOSIS — J441 Chronic obstructive pulmonary disease with (acute) exacerbation: Secondary | ICD-10-CM | POA: Diagnosis not present

## 2023-01-04 DIAGNOSIS — E1122 Type 2 diabetes mellitus with diabetic chronic kidney disease: Secondary | ICD-10-CM | POA: Diagnosis not present

## 2023-01-04 DIAGNOSIS — R1312 Dysphagia, oropharyngeal phase: Secondary | ICD-10-CM | POA: Diagnosis not present

## 2023-01-05 DIAGNOSIS — R1312 Dysphagia, oropharyngeal phase: Secondary | ICD-10-CM | POA: Diagnosis not present

## 2023-01-06 DIAGNOSIS — R1312 Dysphagia, oropharyngeal phase: Secondary | ICD-10-CM | POA: Diagnosis not present

## 2023-01-07 DIAGNOSIS — R1312 Dysphagia, oropharyngeal phase: Secondary | ICD-10-CM | POA: Diagnosis not present

## 2023-01-23 DIAGNOSIS — E119 Type 2 diabetes mellitus without complications: Secondary | ICD-10-CM | POA: Diagnosis not present

## 2023-01-23 DIAGNOSIS — I10 Essential (primary) hypertension: Secondary | ICD-10-CM | POA: Diagnosis not present

## 2023-01-23 DIAGNOSIS — E559 Vitamin D deficiency, unspecified: Secondary | ICD-10-CM | POA: Diagnosis not present

## 2023-01-24 DIAGNOSIS — D649 Anemia, unspecified: Secondary | ICD-10-CM | POA: Diagnosis not present

## 2023-01-24 DIAGNOSIS — E875 Hyperkalemia: Secondary | ICD-10-CM | POA: Diagnosis not present

## 2023-01-30 DIAGNOSIS — I10 Essential (primary) hypertension: Secondary | ICD-10-CM | POA: Diagnosis not present

## 2023-03-14 DIAGNOSIS — E1122 Type 2 diabetes mellitus with diabetic chronic kidney disease: Secondary | ICD-10-CM | POA: Diagnosis not present

## 2023-03-14 DIAGNOSIS — J449 Chronic obstructive pulmonary disease, unspecified: Secondary | ICD-10-CM | POA: Diagnosis not present

## 2023-03-14 DIAGNOSIS — I1310 Hypertensive heart and chronic kidney disease without heart failure, with stage 1 through stage 4 chronic kidney disease, or unspecified chronic kidney disease: Secondary | ICD-10-CM | POA: Diagnosis not present

## 2023-03-14 DIAGNOSIS — F01518 Vascular dementia, unspecified severity, with other behavioral disturbance: Secondary | ICD-10-CM | POA: Diagnosis not present

## 2023-03-15 DIAGNOSIS — I10 Essential (primary) hypertension: Secondary | ICD-10-CM | POA: Diagnosis not present

## 2023-03-15 DIAGNOSIS — Z79899 Other long term (current) drug therapy: Secondary | ICD-10-CM | POA: Diagnosis not present

## 2023-03-21 DIAGNOSIS — K219 Gastro-esophageal reflux disease without esophagitis: Secondary | ICD-10-CM | POA: Diagnosis not present

## 2023-03-21 DIAGNOSIS — E785 Hyperlipidemia, unspecified: Secondary | ICD-10-CM | POA: Diagnosis not present

## 2023-03-21 DIAGNOSIS — Z993 Dependence on wheelchair: Secondary | ICD-10-CM | POA: Diagnosis not present

## 2023-03-21 DIAGNOSIS — Z9181 History of falling: Secondary | ICD-10-CM | POA: Diagnosis not present

## 2023-03-21 DIAGNOSIS — I5032 Chronic diastolic (congestive) heart failure: Secondary | ICD-10-CM | POA: Diagnosis not present

## 2023-03-21 DIAGNOSIS — R1311 Dysphagia, oral phase: Secondary | ICD-10-CM | POA: Diagnosis not present

## 2023-03-22 DIAGNOSIS — Z993 Dependence on wheelchair: Secondary | ICD-10-CM | POA: Diagnosis not present

## 2023-03-22 DIAGNOSIS — E785 Hyperlipidemia, unspecified: Secondary | ICD-10-CM | POA: Diagnosis not present

## 2023-03-22 DIAGNOSIS — R1311 Dysphagia, oral phase: Secondary | ICD-10-CM | POA: Diagnosis not present

## 2023-03-22 DIAGNOSIS — K219 Gastro-esophageal reflux disease without esophagitis: Secondary | ICD-10-CM | POA: Diagnosis not present

## 2023-03-22 DIAGNOSIS — I5032 Chronic diastolic (congestive) heart failure: Secondary | ICD-10-CM | POA: Diagnosis not present

## 2023-03-22 DIAGNOSIS — Z9181 History of falling: Secondary | ICD-10-CM | POA: Diagnosis not present

## 2023-03-23 DIAGNOSIS — K219 Gastro-esophageal reflux disease without esophagitis: Secondary | ICD-10-CM | POA: Diagnosis not present

## 2023-03-23 DIAGNOSIS — Z993 Dependence on wheelchair: Secondary | ICD-10-CM | POA: Diagnosis not present

## 2023-03-23 DIAGNOSIS — R1311 Dysphagia, oral phase: Secondary | ICD-10-CM | POA: Diagnosis not present

## 2023-03-23 DIAGNOSIS — E785 Hyperlipidemia, unspecified: Secondary | ICD-10-CM | POA: Diagnosis not present

## 2023-03-23 DIAGNOSIS — Z9181 History of falling: Secondary | ICD-10-CM | POA: Diagnosis not present

## 2023-03-23 DIAGNOSIS — I5032 Chronic diastolic (congestive) heart failure: Secondary | ICD-10-CM | POA: Diagnosis not present

## 2023-03-26 DIAGNOSIS — R1311 Dysphagia, oral phase: Secondary | ICD-10-CM | POA: Diagnosis not present

## 2023-03-26 DIAGNOSIS — E785 Hyperlipidemia, unspecified: Secondary | ICD-10-CM | POA: Diagnosis not present

## 2023-03-26 DIAGNOSIS — Z993 Dependence on wheelchair: Secondary | ICD-10-CM | POA: Diagnosis not present

## 2023-03-26 DIAGNOSIS — Z9181 History of falling: Secondary | ICD-10-CM | POA: Diagnosis not present

## 2023-03-26 DIAGNOSIS — I5032 Chronic diastolic (congestive) heart failure: Secondary | ICD-10-CM | POA: Diagnosis not present

## 2023-03-26 DIAGNOSIS — K219 Gastro-esophageal reflux disease without esophagitis: Secondary | ICD-10-CM | POA: Diagnosis not present

## 2023-03-27 DIAGNOSIS — Z993 Dependence on wheelchair: Secondary | ICD-10-CM | POA: Diagnosis not present

## 2023-03-27 DIAGNOSIS — Z9181 History of falling: Secondary | ICD-10-CM | POA: Diagnosis not present

## 2023-03-27 DIAGNOSIS — K219 Gastro-esophageal reflux disease without esophagitis: Secondary | ICD-10-CM | POA: Diagnosis not present

## 2023-03-27 DIAGNOSIS — E785 Hyperlipidemia, unspecified: Secondary | ICD-10-CM | POA: Diagnosis not present

## 2023-03-27 DIAGNOSIS — I5032 Chronic diastolic (congestive) heart failure: Secondary | ICD-10-CM | POA: Diagnosis not present

## 2023-03-27 DIAGNOSIS — R1311 Dysphagia, oral phase: Secondary | ICD-10-CM | POA: Diagnosis not present

## 2023-03-28 DIAGNOSIS — Z993 Dependence on wheelchair: Secondary | ICD-10-CM | POA: Diagnosis not present

## 2023-03-28 DIAGNOSIS — R1311 Dysphagia, oral phase: Secondary | ICD-10-CM | POA: Diagnosis not present

## 2023-03-28 DIAGNOSIS — Z9181 History of falling: Secondary | ICD-10-CM | POA: Diagnosis not present

## 2023-03-28 DIAGNOSIS — E785 Hyperlipidemia, unspecified: Secondary | ICD-10-CM | POA: Diagnosis not present

## 2023-03-28 DIAGNOSIS — I5032 Chronic diastolic (congestive) heart failure: Secondary | ICD-10-CM | POA: Diagnosis not present

## 2023-03-28 DIAGNOSIS — K219 Gastro-esophageal reflux disease without esophagitis: Secondary | ICD-10-CM | POA: Diagnosis not present

## 2023-03-29 DIAGNOSIS — K219 Gastro-esophageal reflux disease without esophagitis: Secondary | ICD-10-CM | POA: Diagnosis not present

## 2023-03-29 DIAGNOSIS — E785 Hyperlipidemia, unspecified: Secondary | ICD-10-CM | POA: Diagnosis not present

## 2023-03-29 DIAGNOSIS — I5032 Chronic diastolic (congestive) heart failure: Secondary | ICD-10-CM | POA: Diagnosis not present

## 2023-03-29 DIAGNOSIS — Z993 Dependence on wheelchair: Secondary | ICD-10-CM | POA: Diagnosis not present

## 2023-03-29 DIAGNOSIS — R1311 Dysphagia, oral phase: Secondary | ICD-10-CM | POA: Diagnosis not present

## 2023-03-29 DIAGNOSIS — Z9181 History of falling: Secondary | ICD-10-CM | POA: Diagnosis not present

## 2023-03-30 DIAGNOSIS — Z9181 History of falling: Secondary | ICD-10-CM | POA: Diagnosis not present

## 2023-03-30 DIAGNOSIS — Z993 Dependence on wheelchair: Secondary | ICD-10-CM | POA: Diagnosis not present

## 2023-03-30 DIAGNOSIS — I5032 Chronic diastolic (congestive) heart failure: Secondary | ICD-10-CM | POA: Diagnosis not present

## 2023-03-30 DIAGNOSIS — R1311 Dysphagia, oral phase: Secondary | ICD-10-CM | POA: Diagnosis not present

## 2023-03-30 DIAGNOSIS — K219 Gastro-esophageal reflux disease without esophagitis: Secondary | ICD-10-CM | POA: Diagnosis not present

## 2023-03-30 DIAGNOSIS — E785 Hyperlipidemia, unspecified: Secondary | ICD-10-CM | POA: Diagnosis not present

## 2023-04-01 DIAGNOSIS — I5032 Chronic diastolic (congestive) heart failure: Secondary | ICD-10-CM | POA: Diagnosis not present

## 2023-04-01 DIAGNOSIS — K219 Gastro-esophageal reflux disease without esophagitis: Secondary | ICD-10-CM | POA: Diagnosis not present

## 2023-04-01 DIAGNOSIS — Z9181 History of falling: Secondary | ICD-10-CM | POA: Diagnosis not present

## 2023-04-01 DIAGNOSIS — R1311 Dysphagia, oral phase: Secondary | ICD-10-CM | POA: Diagnosis not present

## 2023-04-01 DIAGNOSIS — E785 Hyperlipidemia, unspecified: Secondary | ICD-10-CM | POA: Diagnosis not present

## 2023-04-01 DIAGNOSIS — Z993 Dependence on wheelchair: Secondary | ICD-10-CM | POA: Diagnosis not present

## 2023-04-02 DIAGNOSIS — E785 Hyperlipidemia, unspecified: Secondary | ICD-10-CM | POA: Diagnosis not present

## 2023-04-02 DIAGNOSIS — Z993 Dependence on wheelchair: Secondary | ICD-10-CM | POA: Diagnosis not present

## 2023-04-02 DIAGNOSIS — K219 Gastro-esophageal reflux disease without esophagitis: Secondary | ICD-10-CM | POA: Diagnosis not present

## 2023-04-02 DIAGNOSIS — Z9181 History of falling: Secondary | ICD-10-CM | POA: Diagnosis not present

## 2023-04-02 DIAGNOSIS — R1311 Dysphagia, oral phase: Secondary | ICD-10-CM | POA: Diagnosis not present

## 2023-04-02 DIAGNOSIS — I5032 Chronic diastolic (congestive) heart failure: Secondary | ICD-10-CM | POA: Diagnosis not present

## 2023-04-03 DIAGNOSIS — R1311 Dysphagia, oral phase: Secondary | ICD-10-CM | POA: Diagnosis not present

## 2023-04-03 DIAGNOSIS — E785 Hyperlipidemia, unspecified: Secondary | ICD-10-CM | POA: Diagnosis not present

## 2023-04-03 DIAGNOSIS — Z9181 History of falling: Secondary | ICD-10-CM | POA: Diagnosis not present

## 2023-04-03 DIAGNOSIS — I5032 Chronic diastolic (congestive) heart failure: Secondary | ICD-10-CM | POA: Diagnosis not present

## 2023-04-03 DIAGNOSIS — K219 Gastro-esophageal reflux disease without esophagitis: Secondary | ICD-10-CM | POA: Diagnosis not present

## 2023-04-03 DIAGNOSIS — Z993 Dependence on wheelchair: Secondary | ICD-10-CM | POA: Diagnosis not present

## 2023-04-04 DIAGNOSIS — I5032 Chronic diastolic (congestive) heart failure: Secondary | ICD-10-CM | POA: Diagnosis not present

## 2023-04-04 DIAGNOSIS — K219 Gastro-esophageal reflux disease without esophagitis: Secondary | ICD-10-CM | POA: Diagnosis not present

## 2023-04-04 DIAGNOSIS — E785 Hyperlipidemia, unspecified: Secondary | ICD-10-CM | POA: Diagnosis not present

## 2023-04-04 DIAGNOSIS — R1311 Dysphagia, oral phase: Secondary | ICD-10-CM | POA: Diagnosis not present

## 2023-04-04 DIAGNOSIS — Z9181 History of falling: Secondary | ICD-10-CM | POA: Diagnosis not present

## 2023-04-04 DIAGNOSIS — Z993 Dependence on wheelchair: Secondary | ICD-10-CM | POA: Diagnosis not present

## 2023-04-05 DIAGNOSIS — R1311 Dysphagia, oral phase: Secondary | ICD-10-CM | POA: Diagnosis not present

## 2023-04-05 DIAGNOSIS — I5032 Chronic diastolic (congestive) heart failure: Secondary | ICD-10-CM | POA: Diagnosis not present

## 2023-04-05 DIAGNOSIS — Z993 Dependence on wheelchair: Secondary | ICD-10-CM | POA: Diagnosis not present

## 2023-04-05 DIAGNOSIS — E785 Hyperlipidemia, unspecified: Secondary | ICD-10-CM | POA: Diagnosis not present

## 2023-04-05 DIAGNOSIS — Z9181 History of falling: Secondary | ICD-10-CM | POA: Diagnosis not present

## 2023-04-05 DIAGNOSIS — K219 Gastro-esophageal reflux disease without esophagitis: Secondary | ICD-10-CM | POA: Diagnosis not present

## 2023-04-08 DIAGNOSIS — R1311 Dysphagia, oral phase: Secondary | ICD-10-CM | POA: Diagnosis not present

## 2023-04-08 DIAGNOSIS — Z9181 History of falling: Secondary | ICD-10-CM | POA: Diagnosis not present

## 2023-04-08 DIAGNOSIS — E785 Hyperlipidemia, unspecified: Secondary | ICD-10-CM | POA: Diagnosis not present

## 2023-04-08 DIAGNOSIS — I5032 Chronic diastolic (congestive) heart failure: Secondary | ICD-10-CM | POA: Diagnosis not present

## 2023-04-08 DIAGNOSIS — Z993 Dependence on wheelchair: Secondary | ICD-10-CM | POA: Diagnosis not present

## 2023-04-08 DIAGNOSIS — K219 Gastro-esophageal reflux disease without esophagitis: Secondary | ICD-10-CM | POA: Diagnosis not present

## 2023-04-09 DIAGNOSIS — Z993 Dependence on wheelchair: Secondary | ICD-10-CM | POA: Diagnosis not present

## 2023-04-09 DIAGNOSIS — Z9181 History of falling: Secondary | ICD-10-CM | POA: Diagnosis not present

## 2023-04-09 DIAGNOSIS — E785 Hyperlipidemia, unspecified: Secondary | ICD-10-CM | POA: Diagnosis not present

## 2023-04-09 DIAGNOSIS — I5032 Chronic diastolic (congestive) heart failure: Secondary | ICD-10-CM | POA: Diagnosis not present

## 2023-04-09 DIAGNOSIS — K219 Gastro-esophageal reflux disease without esophagitis: Secondary | ICD-10-CM | POA: Diagnosis not present

## 2023-04-09 DIAGNOSIS — R1311 Dysphagia, oral phase: Secondary | ICD-10-CM | POA: Diagnosis not present

## 2023-04-11 DIAGNOSIS — N390 Urinary tract infection, site not specified: Secondary | ICD-10-CM | POA: Diagnosis not present

## 2023-04-11 DIAGNOSIS — I10 Essential (primary) hypertension: Secondary | ICD-10-CM | POA: Diagnosis not present

## 2023-04-11 DIAGNOSIS — I5032 Chronic diastolic (congestive) heart failure: Secondary | ICD-10-CM | POA: Diagnosis not present

## 2023-04-11 DIAGNOSIS — E785 Hyperlipidemia, unspecified: Secondary | ICD-10-CM | POA: Diagnosis not present

## 2023-04-11 DIAGNOSIS — R1311 Dysphagia, oral phase: Secondary | ICD-10-CM | POA: Diagnosis not present

## 2023-04-11 DIAGNOSIS — Z9181 History of falling: Secondary | ICD-10-CM | POA: Diagnosis not present

## 2023-04-11 DIAGNOSIS — Z993 Dependence on wheelchair: Secondary | ICD-10-CM | POA: Diagnosis not present

## 2023-04-11 DIAGNOSIS — K219 Gastro-esophageal reflux disease without esophagitis: Secondary | ICD-10-CM | POA: Diagnosis not present

## 2023-04-13 DIAGNOSIS — I5032 Chronic diastolic (congestive) heart failure: Secondary | ICD-10-CM | POA: Diagnosis not present

## 2023-04-13 DIAGNOSIS — K219 Gastro-esophageal reflux disease without esophagitis: Secondary | ICD-10-CM | POA: Diagnosis not present

## 2023-04-13 DIAGNOSIS — E785 Hyperlipidemia, unspecified: Secondary | ICD-10-CM | POA: Diagnosis not present

## 2023-04-13 DIAGNOSIS — R1311 Dysphagia, oral phase: Secondary | ICD-10-CM | POA: Diagnosis not present

## 2023-04-13 DIAGNOSIS — Z9181 History of falling: Secondary | ICD-10-CM | POA: Diagnosis not present

## 2023-04-13 DIAGNOSIS — Z993 Dependence on wheelchair: Secondary | ICD-10-CM | POA: Diagnosis not present

## 2023-04-20 DIAGNOSIS — K5641 Fecal impaction: Secondary | ICD-10-CM | POA: Diagnosis not present

## 2023-04-20 DIAGNOSIS — I10 Essential (primary) hypertension: Secondary | ICD-10-CM | POA: Diagnosis not present

## 2023-05-07 DIAGNOSIS — J441 Chronic obstructive pulmonary disease with (acute) exacerbation: Secondary | ICD-10-CM | POA: Diagnosis not present

## 2023-05-07 DIAGNOSIS — E079 Disorder of thyroid, unspecified: Secondary | ICD-10-CM | POA: Diagnosis not present

## 2023-05-07 DIAGNOSIS — E440 Moderate protein-calorie malnutrition: Secondary | ICD-10-CM | POA: Diagnosis not present

## 2023-05-08 DIAGNOSIS — I10 Essential (primary) hypertension: Secondary | ICD-10-CM | POA: Diagnosis not present

## 2023-05-08 DIAGNOSIS — E559 Vitamin D deficiency, unspecified: Secondary | ICD-10-CM | POA: Diagnosis not present

## 2023-05-08 DIAGNOSIS — E119 Type 2 diabetes mellitus without complications: Secondary | ICD-10-CM | POA: Diagnosis not present

## 2023-05-10 DIAGNOSIS — E079 Disorder of thyroid, unspecified: Secondary | ICD-10-CM | POA: Diagnosis not present

## 2023-05-10 DIAGNOSIS — J441 Chronic obstructive pulmonary disease with (acute) exacerbation: Secondary | ICD-10-CM | POA: Diagnosis not present

## 2023-05-10 DIAGNOSIS — E440 Moderate protein-calorie malnutrition: Secondary | ICD-10-CM | POA: Diagnosis not present

## 2023-05-10 DIAGNOSIS — I1310 Hypertensive heart and chronic kidney disease without heart failure, with stage 1 through stage 4 chronic kidney disease, or unspecified chronic kidney disease: Secondary | ICD-10-CM | POA: Diagnosis not present

## 2023-05-14 DIAGNOSIS — E559 Vitamin D deficiency, unspecified: Secondary | ICD-10-CM | POA: Diagnosis not present

## 2023-05-14 DIAGNOSIS — E1122 Type 2 diabetes mellitus with diabetic chronic kidney disease: Secondary | ICD-10-CM | POA: Diagnosis not present

## 2023-05-14 DIAGNOSIS — E079 Disorder of thyroid, unspecified: Secondary | ICD-10-CM | POA: Diagnosis not present

## 2024-12-17 ENCOUNTER — Emergency Department (HOSPITAL_COMMUNITY)
Admission: EM | Admit: 2024-12-17 | Discharge: 2024-12-18 | Disposition: A | Discharge status: Skilled Nursing Facility | Attending: Emergency Medicine | Admitting: Emergency Medicine

## 2024-12-17 ENCOUNTER — Other Ambulatory Visit: Payer: Self-pay

## 2024-12-17 ENCOUNTER — Encounter (HOSPITAL_COMMUNITY): Payer: Self-pay | Admitting: Emergency Medicine

## 2024-12-17 DIAGNOSIS — R112 Nausea with vomiting, unspecified: Secondary | ICD-10-CM | POA: Insufficient documentation

## 2024-12-17 DIAGNOSIS — D72829 Elevated white blood cell count, unspecified: Secondary | ICD-10-CM | POA: Insufficient documentation

## 2024-12-17 DIAGNOSIS — R1084 Generalized abdominal pain: Secondary | ICD-10-CM | POA: Insufficient documentation

## 2024-12-17 DIAGNOSIS — E119 Type 2 diabetes mellitus without complications: Secondary | ICD-10-CM | POA: Insufficient documentation

## 2024-12-17 DIAGNOSIS — F0390 Unspecified dementia without behavioral disturbance: Secondary | ICD-10-CM | POA: Insufficient documentation

## 2024-12-17 DIAGNOSIS — R197 Diarrhea, unspecified: Secondary | ICD-10-CM | POA: Insufficient documentation

## 2024-12-17 DIAGNOSIS — E875 Hyperkalemia: Secondary | ICD-10-CM | POA: Insufficient documentation

## 2024-12-17 DIAGNOSIS — Z7984 Long term (current) use of oral hypoglycemic drugs: Secondary | ICD-10-CM | POA: Insufficient documentation

## 2024-12-17 DIAGNOSIS — Z7901 Long term (current) use of anticoagulants: Secondary | ICD-10-CM | POA: Insufficient documentation

## 2024-12-17 DIAGNOSIS — J449 Chronic obstructive pulmonary disease, unspecified: Secondary | ICD-10-CM | POA: Insufficient documentation

## 2024-12-17 DIAGNOSIS — I110 Hypertensive heart disease with heart failure: Secondary | ICD-10-CM | POA: Insufficient documentation

## 2024-12-17 DIAGNOSIS — Z7982 Long term (current) use of aspirin: Secondary | ICD-10-CM | POA: Insufficient documentation

## 2024-12-17 DIAGNOSIS — Z79899 Other long term (current) drug therapy: Secondary | ICD-10-CM | POA: Insufficient documentation

## 2024-12-17 DIAGNOSIS — I2510 Atherosclerotic heart disease of native coronary artery without angina pectoris: Secondary | ICD-10-CM | POA: Insufficient documentation

## 2024-12-17 DIAGNOSIS — I5032 Chronic diastolic (congestive) heart failure: Secondary | ICD-10-CM | POA: Insufficient documentation

## 2024-12-17 MED ORDER — SODIUM CHLORIDE 0.9 % IV BOLUS
500.0000 mL | Freq: Once | INTRAVENOUS | Status: AC
  Administered 2024-12-17: 500 mL via INTRAVENOUS

## 2024-12-17 MED ORDER — ONDANSETRON HCL 4 MG/2ML IJ SOLN
4.0000 mg | Freq: Once | INTRAMUSCULAR | Status: AC | PRN
  Administered 2024-12-17: 4 mg via INTRAVENOUS
  Filled 2024-12-17: qty 2

## 2024-12-17 NOTE — ED Triage Notes (Signed)
 Pt BIB [facility] from [facility] c/o abd pain, n/v/d, denies fever starting today, hx late stage dementia, pt given 4mg  Zofran  en route, 116/70, HR 98, O2 100% RA,  CBG 185

## 2024-12-17 NOTE — ED Provider Notes (Signed)
 " [place] EMERGENCY DEPARTMENT AT [name] Provider Note  CSN: [id number] Arrival date & time: 12/17/24 2246  Chief Complaint(s) Abdominal Pain  History provided by EMS. HPI & MDM [name] is a 84 y.o. female .   Abdominal Pain   Clinical Course as of 12/18/24 0416  Wed Dec 17, 2024  2335 Patient presents by EMS from skilled nursing facility.  They were called out for several hours of abdominal pain with nausea, vomiting and diarrhea.  Both vomiting and diarrhea were nonbloody.  No known sick contacts in the facility.  No reported recent antibiotics.  Level 5 caveat for dementia    [PC]  2336  Medical Decision Making:  The patient's presentation involves an extensive number of treatment options, and the complaint(s) carry with it a high risk of complications and morbidity. The differential diagnosis includes but not limited to those listed below:   [PC]  Thu Dec 18, 2024  0114 CBC with leukocytosis.  No anemia.  Will obtain a CT scan to assess for serious intra-abdominal inflammatory/infectious process.  CMP with mild hyperkalemia.  No other electrolyte derangements.  Hyperglycemia without DKA.  Mild renal insufficiency without overt AKI.  No evidence of bili obstruction or pancreatitis  UA with positive leuks and many bacteria however contaminated with epithelial cells.  Not overtly concerning for infection at this time.   [PC]  0412  CT A/P reassuring.   Patient has not had any additional bouts of emesis.  She is stable for discharge back to sNF.   [PC]    Clinical Course User Index [PC] Anh Mangano, Raynell Moder, MD   Medical Decision Making Amount and/or Complexity of Data Reviewed Labs: ordered. Decision-making details documented in ED Course. Radiology: ordered and independent interpretation performed. Decision-making details documented in ED Course.  Risk Prescription drug management. Parenteral controlled substances. Decision  regarding hospitalization.    Final Clinical Impression(s) / ED Diagnoses Final diagnoses:  Nausea vomiting and diarrhea   The patient appears reasonably screened and/or stabilized for discharge and I doubt any other medical condition or other [facility] requiring further screening, evaluation, or treatment in the ED at this time.   Disposition: Discharge  Condition: Good  ED Discharge Orders          Ordered    ondansetron  (ZOFRAN -ODT) 4 MG disintegrating tablet  Every 8 hours PRN        12/18/24 0416              Follow Up: Primary care provider  Call  to schedule an appointment for close follow up     Past Medical History Past Medical History:  Diagnosis Date   Allergy to ACE inhibitors 03/17/2015   Alzheimer disease (HCC)    Anxiety    CAD (coronary artery disease)    a. s/p inferior STEMI 12/29/10 with rotablator atherectomy RCA 12/31/10 and DES x 2 RCA;  b. Lexiscan  Myoview (02/2015): mild reversible apical anterior perfusion defect. C. cath 02/2015 50% LAD lesion with patent stent, Tx Rx     Cardiomyopathy, ischemic 03/17/2015   Chronic diastolic CHF (congestive heart failure) (HCC) 11/12/2016   COPD (chronic obstructive pulmonary disease) (HCC)    Coronary atherosclerosis of native coronary artery 04/03/2013   Dementia (HCC)    Dementia (HCC)    Depression 03/14/2017   Diverticulosis    DM2 (diabetes mellitus, type 2) (HCC) 04/02/2013   Dysphagia 05/10/2017   Minimal 6/20 - pureed diet   Fibromyalgia 04/20/2008  Qualifier: Diagnosis of  By: Nelson-Smith CMA (AAMA), Dottie     GERD (gastroesophageal reflux disease)    Glaucoma    HTN (hypertension)    Hyperlipidemia    Ischemic cardiomyopathy    EF 45%cath 2012 EF55% 5/12; 35% 11/14   Memory deficit    Nephrolithiasis    Obesity (BMI 30-39.9) 10/06/2015   Osteoarthritis    Overweight(278.02)    Respiratory arrest//ACE Inhibitor presumed cause    Senile dementia without behavioral disturbance (HCC) 10/06/2015    Sleep apnea    Small bowel obstruction (HCC)    from a sigmoid stricture   Ventricular tachycardia (HCC) 03/17/2015   Vitamin D  deficiency 01/28/2017   Patient Active Problem List   Diagnosis Date Noted   Osteoarthritis    Type 2 diabetes mellitus with other specified complication (HCC) 09/03/2020   Real time reverse transcriptase PCR positive for 2019 novel coronavirus 11/29/2019   Pneumonia due to COVID-19 virus 11/29/2019   Hypertension associated with type 2 diabetes mellitus (HCC) 03/06/2019   Dyslipidemia associated with type 2 diabetes mellitus (HCC) 03/06/2019   Acquired hypothyroidism 03/06/2019   Vascular dementia without behavioral disturbance (HCC) 03/06/2019   Insomnia 08/06/2018   GERD (gastroesophageal reflux disease) 08/04/2017   Dysphagia 05/10/2017   Depression 03/14/2017   Vitamin D  deficiency 01/28/2017   Postoperative anemia due to acute blood loss 11/18/2016   HTN (hypertension) 11/18/2016   Closed right hip fracture (HCC) 11/10/2016   Tobacco use disorder 03/17/2015   Cardiomyopathy, ischemic 03/17/2015   Allergy to ACE inhibitors 03/17/2015   Ventricular tachycardia (HCC) 03/17/2015   HLD (hyperlipidemia) 12/31/2014   COPD (chronic obstructive pulmonary disease) (HCC) 12/31/2014   Chronic cough 09/12/2013   Essential hypertension, benign 04/03/2013   Dyslipidemia 04/03/2013   Coronary atherosclerosis of native coronary artery 04/03/2013   Fibromyalgia 04/20/2008   Home Medication(s) Prior to Admission medications  Medication Sig Start Date End Date Taking? Authorizing Provider  ondansetron  (ZOFRAN -ODT) 4 MG disintegrating tablet Take 1 tablet (4 mg total) by mouth every 8 (eight) hours as needed for up to 3 days for nausea or vomiting. 12/18/24 12/21/24 Yes Anasofia Micallef, Raynell Moder, MD  amLODipine  (NORVASC ) 5 MG tablet Take 1 tablet (5 mg total) by mouth daily. 10/21/19   Segal, Jared E, DO  aspirin  EC 81 MG tablet Take 81 mg by mouth daily.    [provider]  atorvastatin  (LIPITOR) 40 MG tablet Take 40 mg by mouth daily.     [provider]  bisacodyl  (DULCOLAX) 10 MG suppository If not relieved by MOM, give 10 mg Bisacodyl  suppositiory rectally X 1 dose in 24 hours as needed (Do not use constipation standing orders for residents with renal failure/CFR less than 30. Contact MD for orders) (Physician Order)    [provider]  Cholecalciferol  (VITAMIN D3) 1.25 MG (50000 UT) CAPS Take 1 capsule by mouth once a week.    [provider]  clopidogrel  (PLAVIX ) 75 MG tablet Take 75 mg by mouth daily.     [provider]  Cranberry-Vitamin C-Inulin (UTI-STAT) LIQD Take by mouth. Give 30 MLS by mouth every day for UTI    [provider]  furosemide  (LASIX ) 20 MG tablet Take 20 mg by mouth daily.     [provider]  isosorbide  mononitrate (IMDUR ) 30 MG 24 hr tablet Take 15 mg by mouth daily. Take 1/2 tablet = 15mg     [provider]  magnesium  hydroxide (MILK OF MAGNESIA) 400 MG/5ML suspension If  no BM in 3 days, give 30 cc Milk of Magnesium  p.o. x 1 dose in 24 hours as needed (Do not use standing constipation orders for residents with renal failure CFR less than 30. Contact MD for orders) (Physician Order)    [provider]  magnesium  oxide (MAG-OX) 400 MG tablet Take 400 mg by mouth daily.    [provider]  memantine  (NAMENDA ) 10 MG tablet Take 10 mg by mouth 2 (two) times daily.     [provider]  metFORMIN  (GLUCOPHAGE ) 500 MG tablet Take 500 mg by mouth daily in the afternoon. After dinner    [provider]  metFORMIN  (GLUCOPHAGE -XR) 750 MG 24 hr tablet Take 750 mg by mouth daily. After breakfast    [provider]  metoprolol  tartrate (LOPRESSOR ) 25 MG tablet Take 12.5 mg by mouth every morning.    [provider]  mometasone -formoterol  (DULERA ) 100-5 MCG/ACT AERO Inhale 2 puffs into the lungs 2 (two) times daily.      [provider]  NON FORMULARY DIET: Regular    [provider]  NUTRITIONAL SUPPLEMENTS PO Take 1 each by mouth See admin instructions. Magic Cup with lunch and dinner meal    [provider]  Omeprazole 20 MG TBEC Take 20 mg by mouth 2 (two) times daily.     [provider]  sennosides-docusate sodium  (SENOKOT-S) 8.6-50 MG tablet Take 2 tablets by mouth daily. 11/10/16   Josefina Chew, MD  Sodium Phosphates (RA SALINE ENEMA RE) If not relieved by Biscodyl suppository, give disposable Saline Enema rectally X 1 dose/24 hrs as needed (Do not use constipation standing orders for residents with renal failure/CFR less than 30. Contact MD for orders)(Physician Or    [provider]  traMADol  (ULTRAM ) 50 MG tablet Take 1 tablet (50 mg total) by mouth daily. 01/26/21   Reed, Tiffany L, DO                                                                                                                                    Allergies Lisinopril, Spironolactone , Vancomycin , Donepezil , Egg protein-containing drug products, Erythromycin, Macrolides and ketolides, Penicillins, Quinapril hcl, Angiotensin receptor blockers, Lipitor [atorvastatin ], Pentazocine lactate, Propoxyphene hcl, and Sulfonamide derivatives  Review of Systems Review of Systems  Gastrointestinal:  Positive for abdominal pain.   As noted in HPI  Physical Exam Vital Signs  I have reviewed the triage vital signs BP (!) 112/45 (BP Location: Right Arm)   Pulse 99   Temp 98.1 F (36.7 C) (Axillary)   Resp 15   Ht 5' 4 (1.626 m)   Wt 54.4 kg   LMP  (LMP Unknown)   SpO2 95%   BMI 20.60 kg/m   Physical Exam Vitals reviewed.  Constitutional:      General: She is not in acute distress.    Appearance: She is well-developed. She is not diaphoretic.  HENT:  Head: Normocephalic and atraumatic.     Right Ear: External ear normal.     Left Ear: External ear normal.     Nose: Nose normal.   Eyes:     General: No scleral icterus.    Conjunctiva/sclera: Conjunctivae normal.  Neck:     Trachea: Phonation normal.  Cardiovascular:     Rate and Rhythm: Normal rate and regular rhythm.  Pulmonary:     Effort: Pulmonary effort is normal. No respiratory distress.     Breath sounds: No stridor.  Abdominal:     General: There is no distension.     Tenderness: There is generalized abdominal tenderness (mild discomfort).  Musculoskeletal:        General: Normal range of motion.     Cervical back: Normal range of motion.  Neurological:     Mental Status: She is alert. Mental status is at baseline. She is disoriented.  Psychiatric:        Behavior: Behavior normal.     ED Results and Treatments Labs (all labs ordered are listed, but only abnormal results are displayed) Labs Reviewed  COMPREHENSIVE METABOLIC PANEL WITH GFR - Abnormal; Notable for the following components:      Result Value   Potassium 5.2 (*)    Glucose, Bld 153 (*)    BUN 31 (*)    Creatinine, Ser 1.05 (*)    GFR, Estimated 52 (*)    All other components within normal limits  CBC - Abnormal; Notable for the following components:   WBC 16.5 (*)    All other components within normal limits  URINALYSIS, ROUTINE W REFLEX MICROSCOPIC - Abnormal; Notable for the following components:   APPearance HAZY (*)    Leukocytes,Ua SMALL (*)    Bacteria, UA MANY (*)    All other components within normal limits  MAGNESIUM  - Abnormal; Notable for the following components:   Magnesium  1.6 (*)    All other components within normal limits  LIPASE, BLOOD                                                                                                                         EKG  EKG Interpretation Date/Time:    Ventricular Rate:    PR Interval:    QRS Duration:    QT Interval:    QTC Calculation:   R Axis:      Text Interpretation:         Radiology CT ABDOMEN PELVIS W CONTRAST Result Date: 12/18/2024 EXAM:  CT ABDOMEN AND PELVIS WITH CONTRAST 12/18/2024 01:30:19 AM TECHNIQUE: CT of the abdomen and pelvis was performed with the administration of 80 mL of iohexol  (OMNIPAQUE ) 300 MG/ML solution. Multiplanar reformatted images are provided for review. Automated exposure control, iterative reconstruction, and/or weight-based adjustment of the mA/kV was utilized to reduce the radiation dose to as low as reasonably achievable. COMPARISON: None available. CLINICAL HISTORY: Abdominal pain, acute, nonlocalized. FINDINGS: LOWER CHEST: Coronary artery calcifications. LIVER: The liver  is unremarkable. GALLBLADDER AND BILE DUCTS: Gallbladder is unremarkable. No biliary ductal dilatation. SPLEEN: No acute abnormality. PANCREAS: Diffusely atrophic pancreas. No focal lesion. Otherwise normal pancreatic contour. No surrounding inflammatory changes. No main pancreatic ductal dilatation. ADRENAL GLANDS: No acute abnormality. KIDNEYS, URETERS AND BLADDER: No stones in the kidneys or ureters. No hydronephrosis. No perinephric or periureteral stranding. Urinary bladder is unremarkable. GI AND BOWEL: Small Hiatal hernia. Several loops of small bowel are distended with fluid and fecalith material, with no definite transition point or dilatation to suggest obstruction. No small or large bowel thickening or dilatation. The appendix is not definitely identified with no inflammatory changes in the right lower quadrant to suggest acute appendicitis. Rectosigmoid all sutures noted. PERITONEUM AND RETROPERITONEUM: No ascites. No free air. VASCULATURE: Aorta is normal in caliber. Severe atherosclerotic plaque. LYMPH NODES: No lymphadenopathy. REPRODUCTIVE ORGANS: The uterus is unremarkable. There is no adnexal mass. BONES AND SOFT TISSUES: Total right hip arthroplasty. 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 Stable chronic L1 and L5 compression fractures. Diffusely decreased bone density. Multilevel severe degenerative  changes of the spine. No focal soft tissue abnormality. IMPRESSION: 1. No acute abnormality in the abdomen or pelvis. 2. Several small bowel loops with fecalized intraluminal contents without transition point or bowel dilatation to suggest obstruction. 3. Severe atherosclerotic plaque with coronary artery calcifications. Electronically signed by: Morgane Naveau MD 12/18/2024 01:41 AM EST RP Workstation: [id number]    Medications Ordered in ED Medications  HYDROmorphone  (DILAUDID ) injection 0.5 mg (0 mg Intravenous Hold 12/18/24 0154)  ondansetron  (ZOFRAN ) injection 4 mg (4 mg Intravenous Given 12/17/24 2345)  sodium chloride  0.9 % bolus 500 mL (500 mLs Intravenous New Bag/Given 12/17/24 2346)  iohexol  (OMNIPAQUE ) 300 MG/ML solution 100 mL (80 mLs Intravenous Contrast Given 12/18/24 0117)   Procedures Procedures  (including critical care time)   This chart was dictated using voice recognition software.  Despite best efforts to proofread,  errors can occur which can change the documentation meaning.   Trine Raynell Moder, MD 12/18/24 [phone]  "

## 2024-12-18 ENCOUNTER — Emergency Department (HOSPITAL_COMMUNITY)

## 2024-12-18 ENCOUNTER — Encounter (HOSPITAL_COMMUNITY): Payer: Self-pay

## 2024-12-18 LAB — CBC
HCT: 39.3 % (ref 36.0–46.0)
Hemoglobin: 12 g/dL (ref 12.0–15.0)
MCH: 29.6 pg (ref 26.0–34.0)
MCHC: 30.5 g/dL (ref 30.0–36.0)
MCV: 96.8 fL (ref 80.0–100.0)
Platelets: 220 10*3/uL (ref 150–400)
RBC: 4.06 MIL/uL (ref 3.87–5.11)
RDW: 13.2 % (ref 11.5–15.5)
WBC: 16.5 10*3/uL — ABNORMAL HIGH (ref 4.0–10.5)
nRBC: 0 % (ref 0.0–0.2)

## 2024-12-18 LAB — URINALYSIS, ROUTINE W REFLEX MICROSCOPIC
Bilirubin Urine: NEGATIVE
Glucose, UA: NEGATIVE mg/dL
Hgb urine dipstick: NEGATIVE
Ketones, ur: NEGATIVE mg/dL
Nitrite: NEGATIVE
Protein, ur: NEGATIVE mg/dL
Specific Gravity, Urine: 1.017 (ref 1.005–1.030)
pH: 5 (ref 5.0–8.0)

## 2024-12-18 LAB — COMPREHENSIVE METABOLIC PANEL WITH GFR
ALT: 22 U/L (ref 0–44)
AST: 26 U/L (ref 15–41)
Albumin: 4 g/dL (ref 3.5–5.0)
Alkaline Phosphatase: 98 U/L (ref 38–126)
Anion gap: 13 (ref 5–15)
BUN: 31 mg/dL — ABNORMAL HIGH (ref 8–23)
CO2: 29 mmol/L (ref 22–32)
Calcium: 10.2 mg/dL (ref 8.9–10.3)
Chloride: 100 mmol/L (ref 98–111)
Creatinine, Ser: 1.05 mg/dL — ABNORMAL HIGH (ref 0.44–1.00)
GFR, Estimated: 52 mL/min — ABNORMAL LOW
Glucose, Bld: 153 mg/dL — ABNORMAL HIGH (ref 70–99)
Potassium: 5.2 mmol/L — ABNORMAL HIGH (ref 3.5–5.1)
Sodium: 142 mmol/L (ref 135–145)
Total Bilirubin: 0.2 mg/dL (ref 0.0–1.2)
Total Protein: 7.8 g/dL (ref 6.5–8.1)

## 2024-12-18 LAB — MAGNESIUM: Magnesium: 1.6 mg/dL — ABNORMAL LOW (ref 1.7–2.4)

## 2024-12-18 LAB — LIPASE, BLOOD: Lipase: 13 U/L (ref 11–51)

## 2024-12-18 MED ORDER — HYDROMORPHONE HCL 1 MG/ML IJ SOLN
0.5000 mg | Freq: Once | INTRAMUSCULAR | Status: DC
  Filled 2024-12-18: qty 1

## 2024-12-18 MED ORDER — ONDANSETRON 4 MG PO TBDP: 4.0000 mg | ORAL_TABLET | Freq: Three times a day (TID) | ORAL | 0 refills | Status: DC | PRN

## 2024-12-18 MED ORDER — IOHEXOL 300 MG/ML  SOLN
100.0000 mL | Freq: Once | INTRAMUSCULAR | Status: AC | PRN
  Administered 2024-12-18: 80 mL via INTRAVENOUS

## 2024-12-18 NOTE — ED Notes (Signed)
 Call placed to [facility] for transport to [facility] and [facility]

## 2024-12-18 NOTE — ED Notes (Signed)
 Report called to [facility] and [employer]

## 2024-12-19 ENCOUNTER — Other Ambulatory Visit: Payer: Self-pay

## 2024-12-19 ENCOUNTER — Emergency Department (HOSPITAL_COMMUNITY)

## 2024-12-19 ENCOUNTER — Encounter (HOSPITAL_COMMUNITY): Payer: Self-pay | Admitting: Emergency Medicine

## 2024-12-19 ENCOUNTER — Inpatient Hospital Stay (HOSPITAL_COMMUNITY)
Admission: EM | Admit: 2024-12-19 | Discharge: 2024-12-22 | DRG: 871 | Disposition: A | Discharge status: Skilled Nursing Facility | Attending: Internal Medicine | Admitting: Internal Medicine

## 2024-12-19 DIAGNOSIS — Z515 Encounter for palliative care: Secondary | ICD-10-CM

## 2024-12-19 DIAGNOSIS — Z66 Do not resuscitate: Secondary | ICD-10-CM | POA: Diagnosis present

## 2024-12-19 DIAGNOSIS — Z1152 Encounter for screening for COVID-19: Secondary | ICD-10-CM

## 2024-12-19 DIAGNOSIS — R64 Cachexia: Secondary | ICD-10-CM | POA: Diagnosis present

## 2024-12-19 DIAGNOSIS — G309 Alzheimer's disease, unspecified: Secondary | ICD-10-CM | POA: Diagnosis present

## 2024-12-19 DIAGNOSIS — R4182 Altered mental status, unspecified: Secondary | ICD-10-CM

## 2024-12-19 DIAGNOSIS — Z955 Presence of coronary angioplasty implant and graft: Secondary | ICD-10-CM

## 2024-12-19 DIAGNOSIS — Z7189 Other specified counseling: Secondary | ICD-10-CM

## 2024-12-19 DIAGNOSIS — Z7984 Long term (current) use of oral hypoglycemic drugs: Secondary | ICD-10-CM

## 2024-12-19 DIAGNOSIS — N179 Acute kidney failure, unspecified: Secondary | ICD-10-CM | POA: Diagnosis present

## 2024-12-19 DIAGNOSIS — I5032 Chronic diastolic (congestive) heart failure: Secondary | ICD-10-CM | POA: Diagnosis present

## 2024-12-19 DIAGNOSIS — E1169 Type 2 diabetes mellitus with other specified complication: Secondary | ICD-10-CM | POA: Diagnosis present

## 2024-12-19 DIAGNOSIS — I152 Hypertension secondary to endocrine disorders: Secondary | ICD-10-CM | POA: Diagnosis present

## 2024-12-19 DIAGNOSIS — Z882 Allergy status to sulfonamides status: Secondary | ICD-10-CM

## 2024-12-19 DIAGNOSIS — E1159 Type 2 diabetes mellitus with other circulatory complications: Secondary | ICD-10-CM | POA: Diagnosis present

## 2024-12-19 DIAGNOSIS — F0150 Vascular dementia without behavioral disturbance: Secondary | ICD-10-CM | POA: Diagnosis present

## 2024-12-19 DIAGNOSIS — A419 Sepsis, unspecified organism: Principal | ICD-10-CM | POA: Diagnosis present

## 2024-12-19 DIAGNOSIS — F17210 Nicotine dependence, cigarettes, uncomplicated: Secondary | ICD-10-CM | POA: Diagnosis present

## 2024-12-19 DIAGNOSIS — Z8249 Family history of ischemic heart disease and other diseases of the circulatory system: Secondary | ICD-10-CM

## 2024-12-19 DIAGNOSIS — Z881 Allergy status to other antibiotic agents status: Secondary | ICD-10-CM

## 2024-12-19 DIAGNOSIS — Z794 Long term (current) use of insulin: Secondary | ICD-10-CM

## 2024-12-19 DIAGNOSIS — J449 Chronic obstructive pulmonary disease, unspecified: Secondary | ICD-10-CM | POA: Diagnosis present

## 2024-12-19 DIAGNOSIS — J9601 Acute respiratory failure with hypoxia: Secondary | ICD-10-CM | POA: Diagnosis present

## 2024-12-19 DIAGNOSIS — Z87442 Personal history of urinary calculi: Secondary | ICD-10-CM

## 2024-12-19 DIAGNOSIS — M797 Fibromyalgia: Secondary | ICD-10-CM | POA: Diagnosis present

## 2024-12-19 DIAGNOSIS — J690 Pneumonitis due to inhalation of food and vomit: Secondary | ICD-10-CM | POA: Diagnosis present

## 2024-12-19 DIAGNOSIS — I252 Old myocardial infarction: Secondary | ICD-10-CM

## 2024-12-19 DIAGNOSIS — I255 Ischemic cardiomyopathy: Secondary | ICD-10-CM | POA: Diagnosis present

## 2024-12-19 DIAGNOSIS — Z711 Person with feared health complaint in whom no diagnosis is made: Secondary | ICD-10-CM

## 2024-12-19 DIAGNOSIS — E7849 Other hyperlipidemia: Secondary | ICD-10-CM | POA: Diagnosis present

## 2024-12-19 DIAGNOSIS — Z79899 Other long term (current) drug therapy: Secondary | ICD-10-CM

## 2024-12-19 DIAGNOSIS — Z833 Family history of diabetes mellitus: Secondary | ICD-10-CM

## 2024-12-19 DIAGNOSIS — E872 Acidosis, unspecified: Secondary | ICD-10-CM | POA: Diagnosis present

## 2024-12-19 DIAGNOSIS — I2510 Atherosclerotic heart disease of native coronary artery without angina pectoris: Secondary | ICD-10-CM | POA: Diagnosis present

## 2024-12-19 DIAGNOSIS — Z96641 Presence of right artificial hip joint: Secondary | ICD-10-CM | POA: Diagnosis present

## 2024-12-19 DIAGNOSIS — Z888 Allergy status to other drugs, medicaments and biological substances status: Secondary | ICD-10-CM

## 2024-12-19 DIAGNOSIS — Z7982 Long term (current) use of aspirin: Secondary | ICD-10-CM

## 2024-12-19 DIAGNOSIS — Z7902 Long term (current) use of antithrombotics/antiplatelets: Secondary | ICD-10-CM

## 2024-12-19 DIAGNOSIS — Z880 Allergy status to penicillin: Secondary | ICD-10-CM

## 2024-12-19 DIAGNOSIS — Z6830 Body mass index (BMI) 30.0-30.9, adult: Secondary | ICD-10-CM

## 2024-12-19 DIAGNOSIS — E875 Hyperkalemia: Secondary | ICD-10-CM | POA: Diagnosis present

## 2024-12-19 DIAGNOSIS — Z91012 Allergy to eggs, unspecified: Secondary | ICD-10-CM

## 2024-12-19 DIAGNOSIS — R6520 Severe sepsis without septic shock: Secondary | ICD-10-CM | POA: Diagnosis present

## 2024-12-19 DIAGNOSIS — Z7951 Long term (current) use of inhaled steroids: Secondary | ICD-10-CM

## 2024-12-19 LAB — CBC WITH DIFFERENTIAL/PLATELET

## 2024-12-19 LAB — COMPREHENSIVE METABOLIC PANEL WITH GFR
ALT: 18 U/L (ref 0–44)
AST: 27 U/L (ref 15–41)
Albumin: 3.8 g/dL (ref 3.5–5.0)
Alkaline Phosphatase: 93 U/L (ref 38–126)
Anion gap: 13 (ref 5–15)
BUN: 34 mg/dL — ABNORMAL HIGH (ref 8–23)
CO2: 23 mmol/L (ref 22–32)
Calcium: 9.8 mg/dL (ref 8.9–10.3)
Chloride: 103 mmol/L (ref 98–111)
Creatinine, Ser: 1.4 mg/dL — ABNORMAL HIGH (ref 0.44–1.00)
GFR, Estimated: 37 mL/min — ABNORMAL LOW
Glucose, Bld: 178 mg/dL — ABNORMAL HIGH (ref 70–99)
Potassium: 5.3 mmol/L — ABNORMAL HIGH (ref 3.5–5.1)
Sodium: 140 mmol/L (ref 135–145)
Total Bilirubin: 0.3 mg/dL (ref 0.0–1.2)
Total Protein: 7.7 g/dL (ref 6.5–8.1)

## 2024-12-19 LAB — I-STAT CG4 LACTIC ACID, ED: Lactic Acid, Venous: 4.1 mmol/L (ref 0.5–1.9)

## 2024-12-19 LAB — RESP PANEL BY RT-PCR (RSV, FLU A&B, COVID)  RVPGX2
Influenza A by PCR: NEGATIVE
Influenza B by PCR: NEGATIVE
Resp Syncytial Virus by PCR: NEGATIVE
SARS Coronavirus 2 by RT PCR: NEGATIVE

## 2024-12-19 MED ORDER — LACTATED RINGERS IV BOLUS (SEPSIS)
250.0000 mL | Freq: Once | INTRAVENOUS | Status: AC
  Administered 2024-12-20: 250 mL via INTRAVENOUS

## 2024-12-19 MED ORDER — LACTATED RINGERS IV BOLUS (SEPSIS)
500.0000 mL | Freq: Once | INTRAVENOUS | Status: AC
  Administered 2024-12-20: 500 mL via INTRAVENOUS

## 2024-12-19 MED ORDER — LACTATED RINGERS IV BOLUS (SEPSIS)
1000.0000 mL | Freq: Once | INTRAVENOUS | Status: AC
  Administered 2024-12-19: 1000 mL via INTRAVENOUS

## 2024-12-19 MED ORDER — LINEZOLID 600 MG/300ML IV SOLN
600.0000 mg | Freq: Once | INTRAVENOUS | Status: AC
  Administered 2024-12-20: 600 mg via INTRAVENOUS
  Filled 2024-12-19: qty 300

## 2024-12-19 MED ORDER — LACTATED RINGERS IV SOLN: INTRAVENOUS | Status: DC

## 2024-12-19 MED ORDER — ACETAMINOPHEN 650 MG RE SUPP
650.0000 mg | Freq: Once | RECTAL | Status: AC
  Administered 2024-12-20: 650 mg via RECTAL
  Filled 2024-12-19: qty 1

## 2024-12-19 MED ORDER — SODIUM CHLORIDE 0.9 % IV SOLN
2.0000 g | Freq: Once | INTRAVENOUS | Status: AC
  Administered 2024-12-19: 2 g via INTRAVENOUS
  Filled 2024-12-19: qty 12.5

## 2024-12-19 MED ORDER — ACETAMINOPHEN 325 MG PO TABS: 650.0000 mg | ORAL_TABLET | Freq: Once | ORAL | Status: DC

## 2024-12-19 MED ORDER — VANCOMYCIN HCL IN DEXTROSE 1-5 GM/200ML-% IV SOLN: 1000.0000 mg | Freq: Once | INTRAVENOUS | Status: DC

## 2024-12-19 NOTE — ED Provider Notes (Signed)
 " [facility] AT [name] Provider Note   CSN: [id number] Arrival date & time: 12/19/24  2135     Patient presents with: Altered Mental Status   [name] is a 84 y.o. female.  {Add pertinent medical, surgical, social history, OB history to YEP:67052} The history is provided by the patient and medical records.  Altered Mental Status  Level 5 caveat: Altered mental status  84 year old female with history of coronary artery disease, hyperlipidemia, COPD, GERD, depression, Alzheimer's dementia, glaucoma, fibromyalgia, presenting to the ED with altered mental status.  Seen here 2 days ago for nausea and vomiting, had negative workup during that visit.  Patient has apparently had fever at facility and has been less talkative than normal.  Sats were marginal with sats of 91% on EMS arrival, she was placed on 4 L.  She is not generally oxygen  dependent.    Prior to Admission medications  Medication Sig Start Date End Date Taking? Authorizing Provider  amLODipine  (NORVASC ) 5 MG tablet Take 1 tablet (5 mg total) by mouth daily. 10/21/19   Segal, Jared E, DO  aspirin  EC 81 MG tablet Take 81 mg by mouth daily.    [provider]  atorvastatin  (LIPITOR) 40 MG tablet Take 40 mg by mouth daily.     [provider]  bisacodyl  (DULCOLAX) 10 MG suppository If not relieved by MOM, give 10 mg Bisacodyl  suppositiory rectally X 1 dose in 24 hours as needed (Do not use constipation standing orders for residents with renal failure/CFR less than 30. Contact MD for orders) (Physician Order)    [provider]  Cholecalciferol  (VITAMIN D3) 1.25 MG (50000 UT) CAPS Take 1 capsule by mouth once a week.    [provider]  clopidogrel  (PLAVIX ) 75 MG tablet Take 75 mg by mouth daily.     [provider]  Cranberry-Vitamin C-Inulin (UTI-STAT) LIQD Take by mouth. Give 30 MLS by mouth every day for UTI    [provider]   furosemide  (LASIX ) 20 MG tablet Take 20 mg by mouth daily.     [provider]  isosorbide  mononitrate (IMDUR ) 30 MG 24 hr tablet Take 15 mg by mouth daily. Take 1/2 tablet = 15mg     [provider]  magnesium  hydroxide (MILK OF MAGNESIA) 400 MG/5ML suspension If no BM in 3 days, give 30 cc Milk of Magnesium  p.o. x 1 dose in 24 hours as needed (Do not use standing constipation orders for residents with renal failure CFR less than 30. Contact MD for orders) (Physician Order)    [provider]  magnesium  oxide (MAG-OX) 400 MG tablet Take 400 mg by mouth daily.    [provider]  memantine  (NAMENDA ) 10 MG tablet Take 10 mg by mouth 2 (two) times daily.     [provider]  metFORMIN  (GLUCOPHAGE ) 500 MG tablet Take 500 mg by mouth daily in the afternoon. After dinner    [provider]  metFORMIN  (GLUCOPHAGE -XR) 750 MG 24 hr tablet Take 750 mg by mouth daily. After breakfast    [provider]  metoprolol  tartrate (LOPRESSOR ) 25 MG tablet Take 12.5 mg by mouth every morning.    [provider]  mometasone -formoterol  (DULERA ) 100-5 MCG/ACT AERO Inhale 2 puffs into the lungs 2 (two) times daily.     [provider]  NON FORMULARY DIET: Regular    [provider]  NUTRITIONAL SUPPLEMENTS PO Take 1 each by mouth See admin  instructions. Magic Cup with lunch and dinner meal    [provider]  Omeprazole 20 MG TBEC Take 20 mg by mouth 2 (two) times daily.     [provider]  ondansetron  (ZOFRAN -ODT) 4 MG disintegrating tablet Take 1 tablet (4 mg total) by mouth every 8 (eight) hours as needed for up to 3 days for nausea or vomiting. 12/18/24 12/21/24  Trine Raynell Moder, MD  sennosides-docusate sodium  (SENOKOT-S) 8.6-50 MG tablet Take 2 tablets by mouth daily. 11/10/16   Josefina Chew, MD  Sodium Phosphates (RA SALINE ENEMA RE) If not relieved by Biscodyl suppository, give disposable Saline Enema  rectally X 1 dose/24 hrs as needed (Do not use constipation standing orders for residents with renal failure/CFR less than 30. Contact MD for orders)(Physician Or    [provider]  traMADol  (ULTRAM ) 50 MG tablet Take 1 tablet (50 mg total) by mouth daily. 01/26/21   Reed, Tiffany L, DO    Allergies: Lisinopril, Spironolactone , Vancomycin , Donepezil , Egg protein-containing drug products, Erythromycin, Macrolides and ketolides, Penicillins, Quinapril hcl, Angiotensin receptor blockers, Lipitor [atorvastatin ], Pentazocine lactate, Propoxyphene hcl, and Sulfonamide derivatives    Review of Systems  Unable to perform ROS: Mental status change    Updated Vital Signs BP (!) 120/42   Pulse 97   Temp (!) 100.8 F (38.2 C) (Oral)   Resp 19   LMP  (LMP Unknown)   SpO2 92%   Physical Exam Vitals and nursing note reviewed.  Constitutional:      Appearance: She is well-developed.  HENT:     Head: Normocephalic and atraumatic.  Eyes:     Conjunctiva/sclera: Conjunctivae normal.     Pupils: Pupils are equal, round, and reactive to light.  Cardiovascular:     Rate and Rhythm: Normal rate and regular rhythm.     Heart sounds: Normal heart sounds.  Pulmonary:     Effort: Pulmonary effort is normal.     Breath sounds: Normal breath sounds.  Abdominal:     General: Bowel sounds are normal.     Palpations: Abdomen is soft.     Tenderness: There is no guarding.     Hernia: No hernia is present.  Musculoskeletal:        General: Normal range of motion.     Cervical back: Normal range of motion.  Skin:    General: Skin is warm and dry.  Neurological:     Mental Status: She is alert and oriented to person, place, and time.     (all labs ordered are listed, but only abnormal results are displayed) Labs Reviewed  I-STAT CG4 LACTIC ACID, ED - Abnormal; Notable for the following components:      Result Value   Lactic Acid, Venous 4.1 (*)    All other components within normal limits   CULTURE, BLOOD (ROUTINE X 2)  CULTURE, BLOOD (ROUTINE X 2)  RESP PANEL BY RT-PCR (RSV, FLU A&B, COVID)  RVPGX2  CBC WITH DIFFERENTIAL/PLATELET  COMPREHENSIVE METABOLIC PANEL WITH GFR  URINALYSIS, W/ REFLEX TO CULTURE (INFECTION SUSPECTED)    EKG: EKG Interpretation Date/Time:  Friday December 19 2024 21:55:03 EST Ventricular Rate:  103 PR Interval:  154 QRS Duration:  80 QT Interval:  358 QTC Calculation: 469 R Axis:   77  Text Interpretation: Sinus tachycardia Consider left atrial enlargement Probable anteroseptal infarct, old when compared to prior, slightly faster rate No STEMI Confirmed by Ginger Barefoot ([id number]) on 12/19/2024 10:51:16 PM  Radiology: CT ABDOMEN PELVIS W CONTRAST  Result Date: 12/18/2024 EXAM: CT ABDOMEN AND PELVIS WITH CONTRAST 12/18/2024 01:30:19 AM TECHNIQUE: CT of the abdomen and pelvis was performed with the administration of 80 mL of iohexol  (OMNIPAQUE ) 300 MG/ML solution. Multiplanar reformatted images are provided for review. Automated exposure control, iterative reconstruction, and/or weight-based adjustment of the mA/kV was utilized to reduce the radiation dose to as low as reasonably achievable. COMPARISON: None available. CLINICAL HISTORY: Abdominal pain, acute, nonlocalized. FINDINGS: LOWER CHEST: Coronary artery calcifications. LIVER: The liver is unremarkable. GALLBLADDER AND BILE DUCTS: Gallbladder is unremarkable. No biliary ductal dilatation. SPLEEN: No acute abnormality. PANCREAS: Diffusely atrophic pancreas. No focal lesion. Otherwise normal pancreatic contour. No surrounding inflammatory changes. No main pancreatic ductal dilatation. ADRENAL GLANDS: No acute abnormality. KIDNEYS, URETERS AND BLADDER: No stones in the kidneys or ureters. No hydronephrosis. No perinephric or periureteral stranding. Urinary bladder is unremarkable. GI AND BOWEL: Small Hiatal hernia. Several loops of small bowel are distended with fluid and fecalith material, with no definite  transition point or dilatation to suggest obstruction. No small or large bowel thickening or dilatation. The appendix is not definitely identified with no inflammatory changes in the right lower quadrant to suggest acute appendicitis. Rectosigmoid all sutures noted. PERITONEUM AND RETROPERITONEUM: No ascites. No free air. VASCULATURE: Aorta is normal in caliber. Severe atherosclerotic plaque. LYMPH NODES: No lymphadenopathy. REPRODUCTIVE ORGANS: The uterus is unremarkable. There is no adnexal mass. BONES AND SOFT TISSUES: Total right hip arthroplasty. 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 Stable chronic L1 and L5 compression fractures. Diffusely decreased bone density. Multilevel severe degenerative changes of the spine. No focal soft tissue abnormality. IMPRESSION: 1. No acute abnormality in the abdomen or pelvis. 2. Several small bowel loops with fecalized intraluminal contents without transition point or bowel dilatation to suggest obstruction. 3. Severe atherosclerotic plaque with coronary artery calcifications. Electronically signed by: Morgane Naveau MD 12/18/2024 01:41 AM EST RP Workstation: [id number]    {Document cardiac monitor, telemetry assessment procedure when appropriate:32947} Procedures   CRITICAL CARE Performed by: Olam CHRISTELLA Slocumb   Total critical care time: 45 minutes  Critical care time was exclusive of separately billable procedures and treating other patients.  Critical care was necessary to treat or prevent imminent or life-threatening deterioration.  Critical care was time spent personally by me on the following activities: development of treatment plan with patient and/or surrogate as well as nursing, discussions with consultants, evaluation of patient's response to treatment, examination of patient, obtaining history from patient or surrogate, ordering and performing treatments and interventions, ordering and review of laboratory studies,  ordering and review of radiographic studies, pulse oximetry and re-evaluation of patient's condition.   Medications Ordered in the ED  acetaminophen  (TYLENOL ) tablet 650 mg (has no administration in time range)  lactated ringers  infusion (has no administration in time range)  lactated ringers  bolus 1,000 mL (has no administration in time range)    And  lactated ringers  bolus 500 mL (has no administration in time range)    And  lactated ringers  bolus 250 mL (has no administration in time range)  ceFEPIme  (MAXIPIME ) 2 g in sodium chloride  0.9 % 100 mL IVPB (has no administration in time range)  linezolid  (ZYVOX ) IVPB 600 mg (has no administration in time range)      {Click here for ABCD2, HEART and other calculators REFRESH Note before signing:1}  Medical Decision Making Amount and/or Complexity of Data Reviewed Labs: ordered. Radiology: ordered.  Risk OTC drugs. Prescription drug management.   ***  {Document critical care time when appropriate  Document review of labs and clinical decision tools ie CHADS2VASC2, etc  Document your independent review of radiology images and any outside records  Document your discussion with family members, caretakers and with consultants  Document social determinants of health affecting pt's care  Document your decision making why or why not admission, treatments were needed:32947:::1}   Final diagnoses:  None    ED Discharge Orders     None        "

## 2024-12-19 NOTE — ED Notes (Addendum)
 Pt. I-stat CG4 Lactic Acid results 4.1, RN and PA made aware.

## 2024-12-19 NOTE — Sepsis Progress Note (Signed)
 Following for sepsis monitoring ?

## 2024-12-19 NOTE — ED Triage Notes (Signed)
 Patient BIB EMS from [facility] for evaluation of altered mental status.  Recently seen for nausea and vomiting.  Fever noted at facility.  Patient altered at baseline.  Less talkative per staff.  Found to have room air saturations at 91% on EMS arrival.  Place on 4 L [place].  Congestive cough present.  Lung sounds clear

## 2024-12-20 DIAGNOSIS — Z66 Do not resuscitate: Secondary | ICD-10-CM | POA: Insufficient documentation

## 2024-12-20 DIAGNOSIS — A419 Sepsis, unspecified organism: Secondary | ICD-10-CM | POA: Diagnosis present

## 2024-12-20 DIAGNOSIS — J690 Pneumonitis due to inhalation of food and vomit: Secondary | ICD-10-CM

## 2024-12-20 DIAGNOSIS — E875 Hyperkalemia: Secondary | ICD-10-CM

## 2024-12-20 DIAGNOSIS — Z7189 Other specified counseling: Secondary | ICD-10-CM

## 2024-12-20 LAB — URINALYSIS, W/ REFLEX TO CULTURE (INFECTION SUSPECTED)
Bilirubin Urine: NEGATIVE
Glucose, UA: NEGATIVE mg/dL
Ketones, ur: NEGATIVE mg/dL
Nitrite: NEGATIVE
Protein, ur: 30 mg/dL — AB
Specific Gravity, Urine: 1.018 (ref 1.005–1.030)
WBC, UA: >50 WBC/hpf (ref 0–5)
pH: 5 (ref 5.0–8.0)

## 2024-12-20 LAB — CBG MONITORING, ED
Glucose-Capillary: 172 mg/dL — ABNORMAL HIGH (ref 70–99)
Glucose-Capillary: 85 mg/dL (ref 70–99)
Glucose-Capillary: 41 mg/dL — CL (ref 70–99)
Glucose-Capillary: 208 mg/dL — ABNORMAL HIGH (ref 70–99)

## 2024-12-20 LAB — CBC WITH DIFFERENTIAL/PLATELET
Basophils Relative: 0 10*3/uL (ref 0.0–0.1)
Eosinophils Absolute: 0 10*3/uL (ref 0.0–0.5)
Eosinophils Relative: 0 10*3/uL (ref 0.0–0.5)
HCT: 36.7 % (ref 36.0–46.0)
Hemoglobin: 11.3 g/dL — ABNORMAL LOW (ref 12.0–15.0)
Lymphocytes Relative: 6 %
Lymphs Abs: 1.3 10*3/uL (ref 0.7–4.0)
MCH: 30.1 pg (ref 26.0–34.0)
MCHC: 30.8 g/dL (ref 30.0–36.0)
MCV: 97.6 fL (ref 80.0–100.0)
Monocytes Absolute: 0 10*3/uL (ref 0.1–1.0)
Monocytes Relative: 0.9 10*3/uL (ref 0.1–1.0)
Monocytes Relative: 5 10*3/uL (ref 0.7–4.0)
Neutro Abs: 17.9 10*3/uL — AB (ref 1.7–7.7)
Neutrophils Relative %: 88 %
Platelets: 210 10*3/uL (ref 150–400)
RBC Morphology: 1
RBC: 3.76 MIL/uL — ABNORMAL LOW (ref 3.87–5.11)
RDW: 13.2 % (ref 11.5–15.5)
Smear Review: 0.18 10*3/uL — AB (ref 0.00–0.07)
WBC Morphology: NORMAL
WBC: 20.4 10*3/uL — ABNORMAL HIGH (ref 4.0–10.5)
nRBC: 0 % (ref 0.0–0.2)

## 2024-12-20 LAB — CBC
HCT: 27.6 % — ABNORMAL LOW (ref 36.0–46.0)
Hemoglobin: 8.7 g/dL — ABNORMAL LOW (ref 12.0–15.0)
MCH: 31.1 pg (ref 26.0–34.0)
MCHC: 31.5 g/dL (ref 30.0–36.0)
MCV: 98.6 fL (ref 80.0–100.0)
Platelets: 139 10*3/uL — ABNORMAL LOW (ref 150–400)
RBC: 2.8 MIL/uL — ABNORMAL LOW (ref 3.87–5.11)
RDW: 13.2 % (ref 11.5–15.5)
WBC: 16.9 10*3/uL — ABNORMAL HIGH (ref 4.0–10.5)
nRBC: 0 % (ref 0.0–0.2)

## 2024-12-20 LAB — BASIC METABOLIC PANEL WITH GFR
Anion gap: 9 (ref 5–15)
BUN: 27 mg/dL — ABNORMAL HIGH (ref 8–23)
CO2: 22 mmol/L (ref 22–32)
Calcium: 7.9 mg/dL — ABNORMAL LOW (ref 8.9–10.3)
Chloride: 110 mmol/L (ref 98–111)
Creatinine, Ser: 1.05 mg/dL — ABNORMAL HIGH (ref 0.44–1.00)
GFR, Estimated: 52 mL/min — ABNORMAL LOW
Glucose, Bld: 74 mg/dL (ref 70–99)
Potassium: 4 mmol/L (ref 3.5–5.1)
Sodium: 141 mmol/L (ref 135–145)

## 2024-12-20 LAB — I-STAT CG4 LACTIC ACID, ED: Lactic Acid, Venous: 3.4 mmol/L (ref 0.5–1.9)

## 2024-12-20 LAB — STREP PNEUMONIAE URINARY ANTIGEN: Strep Pneumo Urinary Antigen: NEGATIVE

## 2024-12-20 LAB — MAGNESIUM: Magnesium: 1.4 mg/dL — ABNORMAL LOW (ref 1.7–2.4)

## 2024-12-20 MED ORDER — LORAZEPAM 1 MG PO TABS: 1.0000 mg | ORAL_TABLET | ORAL | Status: DC | PRN

## 2024-12-20 MED ORDER — LORAZEPAM 2 MG/ML PO CONC: 1.0000 mg | ORAL | Status: DC | PRN

## 2024-12-20 MED ORDER — HALOPERIDOL 0.5 MG PO TABS: 0.5000 mg | ORAL_TABLET | ORAL | Status: DC | PRN

## 2024-12-20 MED ORDER — HALOPERIDOL LACTATE 5 MG/ML IJ SOLN: 0.5000 mg | INTRAMUSCULAR | Status: DC | PRN

## 2024-12-20 MED ORDER — HEPARIN SODIUM (PORCINE) 5000 UNIT/ML IJ SOLN
5000.0000 [IU] | Freq: Three times a day (TID) | INTRAMUSCULAR | Status: DC
  Administered 2024-12-20: 5000 [IU] via SUBCUTANEOUS
  Filled 2024-12-20: qty 1

## 2024-12-20 MED ORDER — LACTATED RINGERS IV SOLN: INTRAVENOUS | Status: AC

## 2024-12-20 MED ORDER — GLYCOPYRROLATE 1 MG PO TABS: 1.0000 mg | ORAL_TABLET | ORAL | Status: DC | PRN

## 2024-12-20 MED ORDER — MORPHINE SULFATE (PF) 2 MG/ML IV SOLN
1.0000 mg | INTRAVENOUS | Status: DC | PRN
  Administered 2024-12-20: 1 mg via INTRAVENOUS
  Filled 2024-12-20: qty 1

## 2024-12-20 MED ORDER — ACETAMINOPHEN 325 MG PO TABS: 650.0000 mg | ORAL_TABLET | Freq: Four times a day (QID) | ORAL | Status: DC | PRN

## 2024-12-20 MED ORDER — INSULIN ASPART 100 UNIT/ML IJ SOLN: 0.0000 [IU] | INTRAMUSCULAR | Status: DC

## 2024-12-20 MED ORDER — POLYVINYL ALCOHOL 1.4 % OP SOLN: 1.0000 [drp] | Freq: Four times a day (QID) | OPHTHALMIC | Status: DC | PRN

## 2024-12-20 MED ORDER — DIPHENHYDRAMINE HCL 50 MG/ML IJ SOLN: 12.5000 mg | INTRAMUSCULAR | Status: DC | PRN

## 2024-12-20 MED ORDER — HALOPERIDOL LACTATE 2 MG/ML PO CONC: 0.5000 mg | ORAL | Status: DC | PRN

## 2024-12-20 MED ORDER — SODIUM CHLORIDE 0.9% FLUSH
3.0000 mL | Freq: Two times a day (BID) | INTRAVENOUS | Status: DC
  Administered 2024-12-20 – 2024-12-22 (×5): 3 mL via INTRAVENOUS

## 2024-12-20 MED ORDER — BIOTENE DRY MOUTH MT LIQD: 15.0000 mL | OROMUCOSAL | Status: DC | PRN

## 2024-12-20 MED ORDER — ACETAMINOPHEN 650 MG RE SUPP: 650.0000 mg | Freq: Four times a day (QID) | RECTAL | Status: DC | PRN

## 2024-12-20 MED ORDER — SODIUM CHLORIDE 0.9 % IV SOLN: 2.0000 g | INTRAVENOUS | Status: DC

## 2024-12-20 MED ORDER — ONDANSETRON HCL 4 MG PO TABS: 4.0000 mg | ORAL_TABLET | Freq: Four times a day (QID) | ORAL | Status: DC | PRN

## 2024-12-20 MED ORDER — GLYCOPYRROLATE 0.2 MG/ML IJ SOLN: 0.2000 mg | INTRAMUSCULAR | Status: DC | PRN

## 2024-12-20 MED ORDER — SODIUM CHLORIDE 0.9 % IV SOLN
100.0000 mg | Freq: Two times a day (BID) | INTRAVENOUS | Status: DC
  Administered 2024-12-20 (×2): 100 mg via INTRAVENOUS
  Filled 2024-12-20 (×2): qty 100

## 2024-12-20 MED ORDER — DEXTROSE 50 % IV SOLN
INTRAVENOUS | Status: AC
  Filled 2024-12-20: qty 50

## 2024-12-20 MED ORDER — ONDANSETRON HCL 4 MG/2ML IJ SOLN
4.0000 mg | Freq: Four times a day (QID) | INTRAMUSCULAR | Status: DC | PRN
  Administered 2024-12-21: 4 mg via INTRAVENOUS
  Filled 2024-12-20: qty 2

## 2024-12-20 MED ORDER — LORAZEPAM 2 MG/ML IJ SOLN: 1.0000 mg | INTRAMUSCULAR | Status: DC | PRN

## 2024-12-20 NOTE — Assessment & Plan Note (Addendum)
 Holding metformin . Placed on sensitive SSI every 4 hours while NPO.

## 2024-12-20 NOTE — Assessment & Plan Note (Addendum)
 Atorvastatin  and Zetia  on hold while NPO

## 2024-12-20 NOTE — Assessment & Plan Note (Addendum)
 Per report, patient presenting with decreased level of interaction compared to baseline dementia Fall and delirium precautions Can resume memantine  if able to tolerate oral meds safely

## 2024-12-20 NOTE — Assessment & Plan Note (Signed)
 After discussions with both of her daughters, will stop aggressive care and transition to comfort measures only Palliative care consulted

## 2024-12-20 NOTE — Assessment & Plan Note (Addendum)
 Patient presenting with fever, tachycardia, AKI, lactic acidosis CXR shows small left pleural effusion with overlying consolidation Suspect aspiration given recent episodes of emesis UA is also abnormal COVID, influenza, RSV negative Treated with cefepime  and linezolid  -> ceftriaxone  and azithromycin  Blood and urine cultures pending Based on GOC conversations with each of her daughters individually, we are all in agreement with transitioning her to comfort measures only Will stop antibiotics

## 2024-12-20 NOTE — Assessment & Plan Note (Addendum)
 In the setting of sepsis Creatinine 1.40 on admission compared to 1.05 on 12/17/2024 Hydrated and has returned to baseline Avoid nephrotoxic medications where possible Repeat BMP in AM

## 2024-12-20 NOTE — ED Notes (Signed)
 Took pt CBG and it was 40s. D50 given

## 2024-12-20 NOTE — Assessment & Plan Note (Addendum)
 Mild in setting of AKI Resolved with hydration

## 2024-12-20 NOTE — Assessment & Plan Note (Addendum)
 Metoprolol , Plavix , atorvastatin , Zetia , Imdur  on hold while NPO

## 2024-12-20 NOTE — H&P (Signed)
 " History and Physical    [name] FMW:[id number] DOB: [birth year] DOA: 12/19/2024  PCP: [facility]  Patient coming from: [facility]  I have personally briefly reviewed patient's old medical records in [facility]  Chief Complaint: Fever, altered mental status  HPI: [name] is a 84 y.o. female with medical history significant for vascular dementia, CAD s/p stenting, insulin -dependent T2DM, HTN, HLD who presented to the ED from her facility for evaluation of fever and change in mentation.  Patient unable to provide history due to dementia which is otherwise supplemented by EDP and chart review.  Patient was seen in the ED on 1/28 for evaluation of nausea, vomiting, and diarrhea.  CT A/P was negative for acute abnormality.  She was given IV Zofran  and IV fluids with improvement.  Patient brought back to the ED today for change in mentation compared to baseline dementia with decreased level of interaction.  She was noted to have fever and cough.  On admitting exam, patient is not communicating verbally.  She will sometimes nod her head to answer questions but not consistently.  ED Course  Labs/Imaging on admission: I have personally reviewed following labs and imaging studies.  Initial vitals showed BP 105/60, pulse 102, RR 19, temp 100.8 F, SpO2 93%.  Labs showed WBC 20.4, hemoglobin 11.3, platelets 210, sodium 140, potassium 5.3, bicarb 23, BUN 34, creatinine 1.40, serum glucose 178, LFTs within normal limits, lactic acid 4.1.  SARS-CoV-2, influenza, RSV PCR negative.  Blood cultures in process.  Urinalysis in process.  Portable chest x-ray showed small left pleural effusion with overlying consolidation versus atelectasis in the left lower lobe.  Patient was given 1.75 L LR and started on IV cefepime  and linezolid  per pharmacy given penicillin and vancomycin  intolerances.  The hospitalist service was consulted for  admission.  Review of Systems:  Unable to obtain full review of systems due to altered mental status superimposed on background of dementia.  Past Medical History:  Diagnosis Date   Allergy to ACE inhibitors 03/17/2015   Alzheimer disease (HCC)    Anxiety    CAD (coronary artery disease)    a. s/p inferior STEMI 12/29/10 with rotablator atherectomy RCA 12/31/10 and DES x 2 RCA;  b. Lexiscan  Myoview (02/2015): mild reversible apical anterior perfusion defect. C. cath 02/2015 50% LAD lesion with patent stent, Tx Rx     Cardiomyopathy, ischemic 03/17/2015   Chronic diastolic CHF (congestive heart failure) (HCC) 11/12/2016   COPD (chronic obstructive pulmonary disease) (HCC)    Coronary atherosclerosis of native coronary artery 04/03/2013   Dementia (HCC)    Dementia (HCC)    Depression 03/14/2017   Diverticulosis    DM2 (diabetes mellitus, type 2) (HCC) 04/02/2013   Dysphagia 05/10/2017   Minimal 6/20 - pureed diet   Fibromyalgia 04/20/2008   Qualifier: Diagnosis of  By: Marcelo CMA (AAMA), Dottie     GERD (gastroesophageal reflux disease)    Glaucoma    HTN (hypertension)    Hyperlipidemia    Ischemic cardiomyopathy    EF 45%cath 2012 EF55% 5/12; 35% 11/14   Memory deficit    Nephrolithiasis    Obesity (BMI 30-39.9) 10/06/2015   Osteoarthritis    Overweight(278.02)    Respiratory arrest//ACE Inhibitor presumed cause    Senile dementia without behavioral disturbance (HCC) 10/06/2015   Sleep apnea    Small bowel obstruction (HCC)    from a sigmoid stricture   Ventricular tachycardia (HCC)  03/17/2015   Vitamin D  deficiency 01/28/2017    Past Surgical History:  Procedure Laterality Date   bilateral tubal ligation     CARDIAC CATHETERIZATION N/A 03/18/2015   Procedure: LEFT HEART CATH AND CORONARY ANGIOGRAPHY;  Surgeon: Dorn JINNY Lesches, MD;  Location: [facility] CATH LAB;  Service: Cardiovascular;  Laterality: N/A;   CARDIAC SURGERY     HIP ARTHROPLASTY Right 11/10/2016   Procedure:  ARTHROPLASTY BIPOLAR HIP (HEMIARTHROPLASTY);  Surgeon: Fonda Olmsted, MD;  Location: [facility] OR;  Service: Orthopedics;  Laterality: Right;   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 Authorizing Provider  amLODipine  (NORVASC ) 5 MG tablet Take 1 tablet (5 mg total) by mouth daily. 10/21/19   Segal, Jared E, DO  aspirin  EC 81 MG tablet Take 81 mg by mouth daily.    [provider]  atorvastatin  (LIPITOR) 40 MG tablet Take 40 mg by mouth daily.     [provider]  bisacodyl  (DULCOLAX) 10 MG suppository If not relieved by MOM, give 10 mg Bisacodyl  suppositiory rectally X 1 dose in 24 hours as needed (Do not use constipation standing orders for residents with renal failure/CFR less than 30. Contact MD for orders) (Physician Order)    [provider]  Cholecalciferol  (VITAMIN D3) 1.25 MG (50000 UT) CAPS Take 1 capsule by mouth once a week.    [provider]  clopidogrel  (PLAVIX ) 75 MG tablet Take 75 mg by mouth daily.     [provider]  Cranberry-Vitamin C-Inulin (UTI-STAT) LIQD Take by mouth. Give 30 MLS by mouth every day for UTI    [provider]  furosemide  (LASIX ) 20 MG tablet Take 20 mg by mouth daily.     [provider]  isosorbide  mononitrate (IMDUR ) 30 MG 24 hr tablet Take 15 mg by mouth daily. Take 1/2 tablet = 15mg     [provider]  magnesium  hydroxide (MILK OF MAGNESIA) 400 MG/5ML suspension If no BM in 3 days, give 30 cc Milk of Magnesium  p.o. x 1 dose in 24 hours as needed (Do not use standing  constipation orders for residents with renal failure CFR less than 30. Contact MD for orders) (Physician Order)    [provider]  magnesium  oxide (MAG-OX) 400 MG tablet Take 400 mg by mouth daily.    [provider]  memantine  (NAMENDA ) 10 MG tablet Take 10 mg by mouth 2 (two) times daily.     [provider]  metFORMIN  (GLUCOPHAGE ) 500 MG tablet Take 500 mg by mouth daily in the afternoon. After dinner    [provider]  metFORMIN  (GLUCOPHAGE -XR) 750 MG 24 hr tablet Take 750 mg by mouth daily. After breakfast    [provider]  metoprolol  tartrate (LOPRESSOR ) 25 MG tablet Take 12.5 mg by mouth every morning.    [provider]  mometasone -formoterol  (DULERA ) 100-5 MCG/ACT AERO Inhale 2 puffs into the lungs 2 (two) times daily.     [provider]  JESSE SCHLOSSMAN  DIET: Regular    [provider]  NUTRITIONAL SUPPLEMENTS PO Take 1 each by mouth See admin instructions. Magic Cup with lunch and dinner meal    [provider]  Omeprazole 20 MG TBEC Take 20 mg by mouth 2 (two) times daily.     [provider]  ondansetron  (ZOFRAN -ODT) 4 MG disintegrating tablet Take 1 tablet (4 mg total) by mouth every 8 (eight) hours as needed for up to 3 days for nausea or vomiting. 12/18/24 12/21/24  Trine Raynell Moder, MD  sennosides-docusate sodium  (SENOKOT-S) 8.6-50 MG tablet Take 2 tablets by mouth daily. 11/10/16   Josefina Chew, MD  Sodium Phosphates (RA SALINE ENEMA RE) If not relieved by Biscodyl suppository, give disposable Saline Enema rectally X 1 dose/24 hrs as needed (Do not use constipation standing orders for residents with renal failure/CFR less than 30. Contact MD for orders)(Physician Or    [provider]  traMADol  (ULTRAM ) 50 MG tablet Take 1 tablet (50 mg total) by mouth daily. 01/26/21   Cloria Annabella CROME, DO    Physical Exam: Vitals:   12/19/24 2230 12/19/24 2300 12/19/24 2345 12/20/24 0021  BP:  (!) 120/42 (!) 123/51  (!) 117/46  Pulse: 97 98 98 (!) 102  Resp: 19 17 17 19   Temp:      TempSrc:      SpO2: 92% 94% 94% 94%   Exam limited due to cooperation/dementia Constitutional: Ackley ill-appearing thin woman resting in bed Eyes: EOMI, lids and conjunctivae normal ENMT: Mucous membranes are dry. Posterior pharynx clear of any exudate or lesions.Normal dentition.  Neck: normal, supple, no masses. Respiratory: Inspiratory crackles left lung base, intermittent gurgling cough. Normal respiratory effort. No accessory muscle use.  Cardiovascular: Regular rate and rhythm, no murmurs / rubs / gallops. No extremity edema. 2+ pedal pulses. Abdomen: Soft, no tenderness Musculoskeletal: no clubbing / cyanosis.  Thin extremities with muscle wasting throughout Skin: no rashes, lesions, ulcers Neurologic: Sensation intact. Strength equal bilaterally Psychiatric: Oriented to self only, not communicating verbally  EKG: Personally reviewed. Sinus tachycardia, rate 103, no acute ischemic changes.  Not significantly changed when compared to previous.  Assessment/Plan Principal Problem:   Severe sepsis (HCC) Active Problems:   Coronary atherosclerosis of native coronary artery   Aspiration pneumonia of left lower lobe (HCC)   AKI (acute kidney injury)   Hypertension associated with type 2 diabetes mellitus (HCC)   Dyslipidemia associated with type 2 diabetes mellitus (HCC)   Vascular dementia without behavioral disturbance (HCC)   Type 2 diabetes mellitus with other specified complication (HCC)   Hyperkalemia   [name] is a 84 y.o. female with medical history significant for vascular dementia, CAD s/p stenting, insulin -dependent T2DM, HTN, HLD who is admitted with severe sepsis secondary to pneumonia.  Assessment and Plan: Severe sepsis suspected due to aspiration pneumonia of left lower lobe of the lung: Patient presenting with fever, tachycardia, AKI, lactic acidosis.  CXR shows  small left pleural effusion with overlying consolidation.  Suspect aspiration given recent episodes of emesis.  UA is also abnormal.  COVID, influenza, RSV negative.  Allergy/intolerances to vancomycin  penicillins noted.  Received cefepime  and linezolid  in the ED. - Narrow antibiotics IV ceftriaxone  and azithromycin  - Continue IV fluid hydration overnight - Follow blood and urine cultures - Check strep pneumonia and Legionella urine antigens - Aspiration precautions, remain n.p.o. tonight - Supplemental oxygen  as needed  Acute kidney injury: Insetting of sepsis.  Creatinine 1.40 on admission compared to 1.05 on  12/17/2024. - Continue IV fluid hydration as above - Monitor UOP, bladder scan and In-N-Out cath as needed - Holding metformin   Hyperkalemia: Mild in setting of AKI.  Continue IV fluid hydration and repeat labs in AM.  Vascular dementia: Per report, patient presenting with decreased level of interaction compared to baseline dementia.  Continue fall and delirium precautions.  Can resume memantine  if able to tolerate oral meds safely.  CAD s/p stenting: Metoprolol , Plavix , atorvastatin , Zetia , Imdur  on hold while NPO.  Insulin -dependent type 2 diabetes: Holding metformin .  Placed on sensitive SSI every 4 hours while NPO.  Hypertension: Antihypertensives on hold in setting of sepsis and risk for hypotension.  Hyperlipidemia: Atorvastatin  and Zetia  on hold while NPO.   DVT prophylaxis: heparin  injection 5,000 Units Start: 12/20/24 0600 Code Status:   Code Status: Limited: Do not attempt resuscitation (DNR) -DNR-LIMITED -Do Not Intubate/DNI DNR form at bedside Family Communication: None present on admission Disposition Plan: Pending clinical progress Consults called: None Severity of Illness: The appropriate patient status for this patient is INPATIENT. Inpatient status is judged to be reasonable and necessary in order to provide the required intensity of service to ensure the  patient's safety. The patient's presenting symptoms, physical exam findings, and initial radiographic and laboratory data in the context of their chronic comorbidities is felt to place them at high risk for further clinical deterioration. Furthermore, it is not anticipated that the patient will be medically stable for discharge from the hospital within 2 midnights of admission.   * I certify that at the point of admission it is my clinical judgment that the patient will require inpatient hospital care spanning beyond 2 midnights from the point of admission due to high intensity of service, high risk for further deterioration and high frequency of surveillance required.DEWAINE Jorie Blanch MD [facility]  If 7PM-7AM, please contact night-coverage [email or web address]  12/20/2024, 1:08 AM      [1]  Social History Tobacco Use   Smoking status: Former    Current packs/day: 1.00    Average packs/day: 1 pack/day for 25.0 years (25.0 ttl pk-yrs)    Types: Cigarettes   Smokeless tobacco: Never   Tobacco comments:    over a ppd for 40+ years; quit 10/2004.  unsuccessfully tried eCigs.  Vaping Use   Vaping status: Never Used  Substance Use Topics   Alcohol  use: No    Alcohol /week: 1.0 standard drink of alcohol     Types: 1 Standard drinks or equivalent per week   Drug use: No  [2]  Allergies Allergen Reactions   Lisinopril Other (See Comments)    Reaction:  Respiratory arrest    Spironolactone  Anaphylaxis   Vancomycin  Anaphylaxis and Rash   Donepezil  Diarrhea   Egg Protein-Containing Drug Products Diarrhea   Erythromycin Other (See Comments)    Reaction:  Makes pt feel weird    Macrolides And Ketolides Other (See Comments)    Reaction:  Unknown    Penicillins Itching and Other (See Comments)    Has patient had a PCN reaction causing immediate rash, facial/tongue/throat swelling, SOB or lightheadedness with hypotension: No Has patient had a PCN reaction causing severe rash involving mucus  membranes or skin necrosis: No Has patient had a PCN reaction that required hospitalization No Has patient had a PCN reaction occurring within the last 10 years: No If all of the above answers are NO, then may proceed with Cephalosporin use.   Quinapril Hcl Itching   Angiotensin Receptor Blockers Other (See Comments)  Reaction:  Unknown    Lipitor [Atorvastatin ] Palpitations   Pentazocine Lactate Other (See Comments)    Reaction:  Unknown    Propoxyphene Hcl Other (See Comments)    Reaction unknown   Sulfonamide Derivatives Other (See Comments)    Reaction:  Unknown    "

## 2024-12-20 NOTE — Assessment & Plan Note (Addendum)
 Antihypertensives on hold in setting of sepsis and risk for hypotension Marginal BPs in the ER, improving with IVF (and cuff manipulation)

## 2024-12-20 NOTE — Progress Notes (Signed)
 " Progress Note   Patient: [name] FMW:[id number] DOB: 06-Dec-1940 DOA: 12/19/2024     0 DOS: the patient was seen and examined on 12/20/2024   Brief hospital course: [name] is a 84 y.o. female with medical history significant for vascular dementia, CAD s/p stenting, insulin -dependent T2DM, HTN, HLD who is admitted with severe sepsis secondary to pneumonia.  Assessment and Plan:  Assessment & Plan Severe sepsis (HCC) Aspiration pneumonia of left lower lobe [facility]) Patient presenting with fever, tachycardia, AKI, lactic acidosis CXR shows small left pleural effusion with overlying consolidation Suspect aspiration given recent episodes of emesis UA is also abnormal COVID, influenza, RSV negative Treated with cefepime  and linezolid  -> ceftriaxone  and azithromycin  Blood and urine cultures pending Based on GOC conversations with each of her daughters individually, we are all in agreement with transitioning her to comfort measures only Will stop antibiotics Coronary atherosclerosis of native coronary artery Metoprolol , Plavix , atorvastatin , Zetia , Imdur  on hold while NPO  AKI (acute kidney injury) In the setting of sepsis Creatinine 1.40 on admission compared to 1.05 on 12/17/2024 Hydrated and has returned to baseline Avoid nephrotoxic medications where possible Repeat BMP in AM Hypertension associated with type 2 diabetes mellitus (HCC) Antihypertensives on hold in setting of sepsis and risk for hypotension Marginal BPs in the ER, improving with IVF (and cuff manipulation) Dyslipidemia associated with type 2 diabetes mellitus (HCC) Atorvastatin  and Zetia  on hold while NPO  Vascular dementia without behavioral disturbance (HCC) Per report, patient presenting with decreased level of interaction compared to baseline dementia Fall and delirium precautions Can resume memantine  if able to tolerate oral meds safely Type 2 diabetes mellitus with other specified complication  (HCC) Holding metformin . Placed on sensitive SSI every 4 hours while NPO.  Hyperkalemia Mild in setting of AKI Resolved with hydration Goals of care, counseling/discussion DNR (do not resuscitate) After discussions with both of her daughters, will stop aggressive care and transition to comfort measures only Palliative care consulted      Consultants: Palliative care  Procedures: None  Antibiotics: Cefepime  x 1 Ceftriaxone  1/31 Doxycycline  1/31 Linezolid  x 1  30 Day Unplanned Readmission Risk Score    Flowsheet Row ED to Hosp-Admission (Current) from 12/19/2024 in [facility] Emergency Department at [facility]  30 Day Unplanned Readmission Risk Score (%) 19.57 Filed at 12/20/2024 0800    This score is the patient's risk of an unplanned readmission within 30 days of being discharged (0 -100%). The score is based on dignosis, age, lab data, medications, orders, and past utilization.   Low:  0-14.9   Medium: 15-21.9   High: 22-29.9   Extreme: 30 and above           Subjective: Awake, nonverbal, contracted.  I spoke with her daughter, [name].  She has been in LTC for 5+ years, maybe even 10.  She was in ALF initially and fell and broke a hip. She was sent to [facility] for rehab and they recommended to keep her there.  She hasn't known her children for at least 2 years.  She just says help me, help me, take me home.  Last week, her weight was improved to 99 from 87 lbs.  She has NO quality of life at this time.  Her daughter wants her to just be comfortable.  She has a DNR.  Her sister might view it as just not treating her.  They called earlier this week and sent her to the hospital for vomiting;  she got better and was sent back to the facility.  Last night, they said her pulse ox was down, not improving with O2.  I spoke with her other daughter, [name].  She last saw her at Christmas.  She would not want to live the way she lives.  She doesn't want her to die  but also doesn't want her to live like she's living.  She agrees with making her comfortable.    Physical Exam: Vitals:   12/20/24 1245 12/20/24 1300 12/20/24 1315 12/20/24 1527  BP: (!) 93/52 (!) 98/48 (!) 106/46 113/63  Pulse:    78  Resp: 20 17 17 20   Temp:    97.6 F (36.4 C)  TempSrc:    Axillary  SpO2:    97%      Intake/Output Summary (Last 24 hours) at 12/20/2024 1532 Last data filed at 12/20/2024 1122 Gross per 24 hour  Intake 3232.77 ml  Output --  Net 3232.77 ml   There were no vitals filed for this visit.  Exam:  General:  Appears calm and comfortable and is in NAD; chronically ill appearing Eyes:  normal lids, iris ENT:  grossly normal hearing, lips & tongue, mmm Cardiovascular:  RRR. No LE edema.  Respiratory:   CTA bilaterally with no wheezes/rales/rhonchi.  Normal respiratory effort. Abdomen:  soft, NT, ND Skin:  no rash or induration seen on limited exam Musculoskeletal:  no bony abnormality Psychiatric: awake but non-verbal Neurologic:  CN 2-12 grossly intact, moves all extremities in coordinated fashion, sensation intact  Data Reviewed: I have reviewed the patient's lab results since admission.  Pertinent labs for today include:   BUN 27/Creatinine 1.05/GFR 52, improved back to baseline Lactate 4.1, 3.3 WBC 16.9 Hgb 8.7 Platelets 139    Family Communication: None present; I spoke with her daughters by telephone  Disposition: Status is: Inpatient Remains inpatient appropriate because: transitioning to comfort care  Planned Discharge Destination: anticipated in-hospital demise    Time spent: 50 minutes  Author: Delon Herald, MD 12/20/2024 3:32 PM  For on call review [email or web address].  "

## 2024-12-20 NOTE — Hospital Course (Signed)
 [name] is a 84 y.o. female with medical history significant for vascular dementia, CAD s/p stenting, insulin -dependent T2DM, HTN, HLD who is admitted with severe sepsis secondary to pneumonia.

## 2024-12-20 NOTE — Plan of Care (Signed)
   Problem: Education: Goal: Individualized Educational Video(s) Outcome: Progressing   Problem: Coping: Goal: Ability to adjust to condition or change in health will improve Outcome: Progressing

## 2024-12-21 DIAGNOSIS — A419 Sepsis, unspecified organism: Principal | ICD-10-CM

## 2024-12-21 DIAGNOSIS — R6520 Severe sepsis without septic shock: Secondary | ICD-10-CM | POA: Diagnosis not present

## 2024-12-21 DIAGNOSIS — Z711 Person with feared health complaint in whom no diagnosis is made: Secondary | ICD-10-CM

## 2024-12-21 DIAGNOSIS — Z79899 Other long term (current) drug therapy: Secondary | ICD-10-CM

## 2024-12-21 DIAGNOSIS — J9601 Acute respiratory failure with hypoxia: Secondary | ICD-10-CM

## 2024-12-21 DIAGNOSIS — Z515 Encounter for palliative care: Secondary | ICD-10-CM

## 2024-12-21 MED ORDER — MORPHINE SULFATE (CONCENTRATE) 10 MG /0.5 ML PO SOLN: 5.0000 mg | ORAL | Status: DC | PRN

## 2024-12-21 MED ORDER — MORPHINE SULFATE (PF) 2 MG/ML IV SOLN: 2.0000 mg | INTRAVENOUS | Status: DC | PRN

## 2024-12-21 NOTE — Progress Notes (Signed)
 " Progress Note   Patient: [name] FMW:[id number] DOB: Apr 10, 1941 DOA: 12/19/2024     1 DOS: the patient was seen and examined on 12/21/2024   Brief hospital course: [name] is a 84 y.o. female with medical history significant for vascular dementia, CAD s/p stenting, insulin -dependent T2DM, HTN, HLD who is admitted with severe sepsis secondary to pneumonia.    Assessment & Plan Severe sepsis (HCC) Aspiration pneumonia of left lower lobe [facility]) Patient presenting with fever, tachycardia, AKI, lactic acidosis CXR shows small left pleural effusion with overlying consolidation Suspect aspiration given recent episodes of emesis UA is also abnormal COVID, influenza, RSV negative Treated with cefepime  and linezolid  -> ceftriaxone  and azithromycin  Blood cultures NTD Urine culture psitive for E faecalis Based on GOC conversations with each of her daughters individually, we are all in agreement with transitioning her to comfort measures only Antibiotics stopped She appears to be more stable today but is not eating/drinking significantly If she continues to be stable tomorrow, will again discuss with family about LTC with hospice vs. Residential hospice Coronary atherosclerosis of native coronary artery Metoprolol , Plavix , atorvastatin , Zetia , Imdur  on hold AKI (acute kidney injury) In the setting of sepsis Creatinine 1.40 on admission compared to 1.05 on 12/17/2024 Hydrated and has returned to baseline No longer checking labs in the setting of comfort care Hypertension associated with type 2 diabetes mellitus (HCC) Antihypertensives on hold in setting of sepsis and risk for hypotension Marginal BPs in the ER, improving with IVF (and cuff manipulation) Current BP is 115/49 Dyslipidemia associated with type 2 diabetes mellitus (HCC) Atorvastatin  and Zetia  on hold  Vascular dementia without behavioral disturbance (HCC) Per report, patient presenting with decreased level of  interaction compared to baseline dementia Fall and delirium precautions Memantine  is on hold Type 2 diabetes mellitus with other specified complication (HCC) Holding metformin  SSI stopped with transition to comfort care  Hyperkalemia Mild in setting of AKI Resolved with hydration Goals of care, counseling/discussion DNR (do not resuscitate) After discussions with both of her daughters, will stop aggressive care and transition to comfort measures only Palliative care consulted Will monitor 1 additional day If she continues to stabilize, will discuss residential hospice vs. LTC with hospice with her daughter(s) Concern about end of life  High risk medication use  Medication management  Acute respiratory failure with hypoxia [facility])  Palliative care encounter       Consultants: Palliative care   Procedures: None   Antibiotics: Cefepime  x 1 Ceftriaxone  1/31 Doxycycline  1/31 Linezolid  x 1  30 Day Unplanned Readmission Risk Score    Flowsheet Row ED to Hosp-Admission (Current) from 12/19/2024 in [facility]Y  30 Day Unplanned Readmission Risk Score (%) 23.32 Filed at 12/21/2024 0401    This score is the patient's risk of an unplanned readmission within 30 days of being discharged (0 -100%). The score is based on dignosis, age, lab data, medications, orders, and past utilization.   Low:  0-14.9   Medium: 15-21.9   High: 22-29.9   Extreme: 30 and above           Subjective: Alert, awake, asked not to be bothered, minimal PO intake.   Objective: Vitals:   12/20/24 1622 12/21/24 0628  BP: (!) 115/49 (!) (P) 91/54  Pulse: 84 (P) 82  Resp: 20 (P) 16  Temp: 97.7 F (36.5 C) (P) 98 F (36.7 C)  SpO2: (!) 89% (P) 93%   No intake or output data in the  24 hours ending 12/21/24 1312  There were no vitals filed for this visit.  Exam:  General:  Appears calm and comfortable and is in NAD, chronically ill-appearing but more stable than  yesterday Eyes:  normal lids, iris ENT:  grossly normal hearing, lips & tongue, mmm Cardiovascular:  RRR. No LE edema.  Respiratory:   CTA bilaterally with no wheezes/rales/rhonchi.  Normal respiratory effort. Abdomen:  soft, NT, ND Skin:  no rash or induration seen on limited exam Musculoskeletal:  contracted Psychiatric:  blunted mood and affect, speech sparse Neurologic:  unable to effectively perform  Data Reviewed: I have reviewed the patient's lab results since admission.  Pertinent labs for today include:   None     Family Communication: None present; I spoke with her daughter, Almarie     Code Status: Do not attempt resuscitation (DNR) - Comfort care    Disposition: Status is: Inpatient Remains inpatient appropriate because: comfort care     Time spent: 50 minutes  Unresulted Labs (From admission, onward)     Start     Ordered   12/20/24 0058  Legionella Pneumophila Serogp 1 Ur Ag  Add-on,   AD        12/20/24 0057             Author: Delon Herald, MD 12/21/2024 1:12 PM  For on call review [email or web address].            "

## 2024-12-21 NOTE — Assessment & Plan Note (Addendum)
 After discussions with both of her daughters, will stop aggressive care and transition to comfort measures only Palliative care consulted Will monitor 1 additional day If she continues to stabilize, will discuss residential hospice vs. LTC with hospice with her daughter(s)

## 2024-12-21 NOTE — TOC Initial Note (Signed)
 Transition of Care [facility]) - Initial/Assessment Note    Patient Details  Name: [name] MRN: [id number] Date of Birth: [birth year]  Transition of Care [facility]) CM/SW Contact:    Tawni CHRISTELLA Eva, LCSW Phone Number: 12/21/2024, 2:40 PM  Clinical Narrative:                  Pt is a LTC resident at [facility]. CSW received a message regarding hospice services. CSW spoke with pts daughter, who reports she was told that pt may pass in the hospital. CSW explained that a consult was placed to discuss IP hospice and to review agency choice. Pts daughter has chosen [facility]. Pts daughter stated that she does not want her mother moved today. CSW explained that the pt would need to be reviewed first and that a bed would need to be available, and that it is more than likely this will not occur today. Referral made to [facility] with [name]. ICM to follow .  Expected Discharge Plan: Hospice Medical Facility Barriers to Discharge: Continued Medical Work up   Patient Goals and CMS Choice            Expected Discharge Plan and Services       Living arrangements for the past 2 months: Skilled Nursing Facility                                      Prior Living Arrangements/Services Living arrangements for the past 2 months: Skilled Nursing Facility Lives with:: Self                   Activities of Daily Living      Permission Sought/Granted                  Emotional Assessment              Admission diagnosis:  Acute respiratory failure with hypoxia (HCC) [J96.01] Severe sepsis (HCC) [A41.9, R65.20] Altered mental status, unspecified altered mental status type [R41.82] Sepsis, due to unspecified organism, unspecified whether acute organ dysfunction present [facility]) [A41.9] Patient Active Problem List   Diagnosis Date Noted   Concern about end of life 12/21/2024   High risk medication use 12/21/2024   Medication management 12/21/2024    Acute respiratory failure with hypoxia (HCC) 12/21/2024   Palliative care encounter 12/21/2024   Severe sepsis (HCC) 12/20/2024   Aspiration pneumonia of left lower lobe (HCC) 12/20/2024   Hyperkalemia 12/20/2024   Goals of care, counseling/discussion 12/20/2024   DNR (do not resuscitate) 12/20/2024   Osteoarthritis    Type 2 diabetes mellitus with other specified complication (HCC) 09/03/2020   Real time reverse transcriptase PCR positive for 2019 novel coronavirus 11/29/2019   Pneumonia due to COVID-19 virus 11/29/2019   AKI (acute kidney injury) 10/14/2019   Hypertension associated with type 2 diabetes mellitus (HCC) 03/06/2019   Dyslipidemia associated with type 2 diabetes mellitus (HCC) 03/06/2019   Acquired hypothyroidism 03/06/2019   Vascular dementia without behavioral disturbance (HCC) 03/06/2019   Insomnia 08/06/2018   GERD (gastroesophageal reflux disease) 08/04/2017   Dysphagia 05/10/2017   Depression 03/14/2017   Vitamin D  deficiency 01/28/2017   Postoperative anemia due to acute blood loss 11/18/2016   HTN (hypertension) 11/18/2016   Closed right hip fracture (HCC) 11/10/2016   Tobacco use disorder 03/17/2015   Cardiomyopathy, ischemic 03/17/2015   Allergy to ACE inhibitors 03/17/2015  Ventricular tachycardia (HCC) 03/17/2015   HLD (hyperlipidemia) 12/31/2014   COPD (chronic obstructive pulmonary disease) (HCC) 12/31/2014   Chronic cough 09/12/2013   Essential hypertension, benign 04/03/2013   Dyslipidemia 04/03/2013   Coronary atherosclerosis of native coronary artery 04/03/2013   Fibromyalgia 04/20/2008   PCP:  [facility]:   [facility] [place], [place] - [address] [place] [address] Phone: [phone] Fax: [phone]     Social Drivers of Health (SDOH) Social History: SDOH Screenings   Tobacco Use: Medium Risk (12/19/2024)   SDOH Interventions:      Readmission Risk Interventions     No data to display

## 2024-12-21 NOTE — Progress Notes (Signed)
 [name] [phone] [facility] Liaison Note:   Referral received from [place], [place] for family interest in [facility]. Eligibility confirmed. Spoke with daughter [name] to confirm interest and explain services.   Unfortunately [facility] is unable to offer a bed today.   TOC aware that liaison will follow up tomorrow or sooner if a bed becomes available.   Thank you for the opportunity to participate in this patient's care.   Nat Babe, BSN, RN Hospice Nurse Liaison [phone]

## 2024-12-21 NOTE — Assessment & Plan Note (Addendum)
 Atorvastatin  and Zetia  on hold

## 2024-12-21 NOTE — Consult Note (Signed)
 " Consultation Note Date: 12/21/2024   Patient Name: [name]  DOB: 08-Mar-1941  MRN: [id number]  Age / Sex: 84 y.o., female   PCP: [facility] Referring Physician: Barbarann Nest, MD  Reason for Consultation: Terminal Care     Chief Complaint/History of Present Illness:   Patient is an 84 year old female with a past medical history of vascular dementia, CAD status post PCI, insulin -dependent diabetes mellitus type 2, hypertension, and hyperlipidemia who was admitted on 12/20/2023 from long-term care facility for evaluation of fever and change in mentation.  Upon admission, patient determined to have severe sepsis secondary to aspiration pneumonia and AKI.  Hospitalist discussed care with patient's daughters on 12/20/2024 and after those discussions patient was transition to comfort focused care.  Palliative medicine team consulted to assist with end-of-life care.  Reviewed hospitalist documentation from yesterday for medical updates.  Reviewed vital signs noting patient having episodes of hypotension.  Reviewed most recent CMP noting estimated GFR 52 to help with medication management.  CBC noted patient's WBC elevated to 16.9, platelets low at 139.  Patient's lactic acid also elevated at 3.4.  Personally reviewed patient's chest x-ray from 12/19/2024 which noted consolidation in left lower lobe.  Patient has not had any oral intake documented.  At time of EMR review in past 24 hours patient has received as needed IV morphine  1 mg x 1 dose.  Presented to bedside to see patient.  No visitors present at bedside.  Patient sleeping comfortably in bed so did not awaken.  Patient did not appear to have increased work of breathing or nonverbal signs of pain.  Discussed care with hospitalist, RN, and TOC to coordinate care after visit.  Placed TOC consult for hospice referral.  Have ordered oral morphine  solution in case patient can return to her facility with hospice support versus  inpatient hospice referral.  TOC to assist with discharge management and coordination.  Noted palliative medicine team will continue to follow along with patient's medical journey.  Primary Diagnoses  Present on Admission:  Severe sepsis (HCC)  AKI (acute kidney injury)  Coronary atherosclerosis of native coronary artery  Dyslipidemia associated with type 2 diabetes mellitus (HCC)  Hypertension associated with type 2 diabetes mellitus (HCC)  Type 2 diabetes mellitus with other specified complication [facility])  Vascular dementia without behavioral disturbance (HCC)   Past Medical History:  Diagnosis Date   Allergy to ACE inhibitors 03/17/2015   Alzheimer disease (HCC)    Anxiety    CAD (coronary artery disease)    a. s/p inferior STEMI 12/29/10 with rotablator atherectomy RCA 12/31/10 and DES x 2 RCA;  b. Lexiscan  Myoview (02/2015): mild reversible apical anterior perfusion defect. C. cath 02/2015 50% LAD lesion with patent stent, Tx Rx     Cardiomyopathy, ischemic 03/17/2015   Chronic diastolic CHF (congestive heart failure) (HCC) 11/12/2016   COPD (chronic obstructive pulmonary disease) (HCC)    Coronary atherosclerosis of native coronary artery 04/03/2013   Dementia (HCC)    Dementia (HCC)    Depression 03/14/2017   Diverticulosis    DM2 (diabetes mellitus, type 2) (HCC) 04/02/2013   Dysphagia 05/10/2017   Minimal 6/20 - pureed diet   Fibromyalgia 04/20/2008   Qualifier: Diagnosis of  By: Marcelo CMA (AAMA), Dottie     GERD (gastroesophageal reflux disease)    Glaucoma    HTN (hypertension)    Hyperlipidemia    Ischemic cardiomyopathy    EF 45%cath 2012 EF55% 5/12; 35% 11/14  Memory deficit    Nephrolithiasis    Obesity (BMI 30-39.9) 10/06/2015   Osteoarthritis    Overweight(278.02)    Respiratory arrest//ACE Inhibitor presumed cause    Senile dementia without behavioral disturbance (HCC) 10/06/2015   Sleep apnea    Small bowel obstruction (HCC)    from a sigmoid stricture    Ventricular tachycardia (HCC) 03/17/2015   Vitamin D  deficiency 01/28/2017   Social History   Socioeconomic History   Marital status: Widowed    Spouse name: Not on file   Number of children: 2   Years of education: 74   Highest education level: Not on file  Occupational History   Occupation: Retired    Comment: Programmer, Systems   Tobacco Use   Smoking status: Former    Current packs/day: 1.00    Average packs/day: 1 pack/day for 25.0 years (25.0 ttl pk-yrs)    Types: Cigarettes   Smokeless tobacco: Never   Tobacco comments:    over a ppd for 40+ years; quit 10/2004.  unsuccessfully tried eCigs.  Vaping Use   Vaping status: Never Used  Substance and Sexual Activity   Alcohol  use: No    Alcohol /week: 1.0 standard drink of alcohol     Types: 1 Standard drinks or equivalent per week   Drug use: No   Sexual activity: Never  Other Topics Concern   Not on file  Social History Narrative   Admitted to [facility] 11/16/16   Patient is widowed with 2 children.   Patient is right handed.   Patient has 11 th grade education.   Former smoker-can't smoke since admitted to [facility]   Alcohol  none   DNR   Social Drivers of Health   Tobacco Use: Medium Risk (12/19/2024)   Patient History    Smoking Tobacco Use: Former    Smokeless Tobacco Use: Never    Passive Exposure: Not on Actuary Strain: Not on file  Food Insecurity: Not on file  Transportation Needs: Not on file  Physical Activity: Not on file  Stress: Not on file  Social Connections: Not on file  Depression (EYV7-0): Not on file  Alcohol  Screen: Not on file  Housing: Not on file  Utilities: Not on file  Health Literacy: Not on file   Family History  Problem Relation Age of Onset   Diabetes Father    Heart disease Father    Heart attack Father    Stroke Mother    Breast cancer Maternal Grandmother    Diabetes Sister    Hypertension Sister    Colon cancer Neg Hx     Scheduled Meds:  sodium chloride  flush  3 mL Intravenous Q12H   Continuous Infusions: PRN Meds:.acetaminophen  **OR** acetaminophen , antiseptic oral rinse, artificial tears, diphenhydrAMINE , glycopyrrolate  **OR** glycopyrrolate  **OR** glycopyrrolate , haloperidol  **OR** haloperidol  **OR** haloperidol  lactate, LORazepam  **OR** LORazepam  **OR** LORazepam , morphine  injection, ondansetron  **OR** ondansetron  (ZOFRAN ) IV Allergies[1] CBC:    Component Value Date/Time   WBC 16.9 (H) 12/20/2024 0619   HGB 8.7 (L) 12/20/2024 0619   HCT 27.6 (L) 12/20/2024 0619   PLT 139 (L) 12/20/2024 0619   MCV 98.6 12/20/2024 0619   NEUTROABS 17.9 (H) 12/19/2024 2222   NEUTROABS 7 08/27/2020 0000   LYMPHSABS 1.3 12/19/2024 2222   MONOABS 0.9 12/19/2024 2222   EOSABS 0.0 12/19/2024 2222   BASOSABS 0.0 12/19/2024 2222   Comprehensive Metabolic Panel:    Component Value Date/Time   NA 141 12/20/2024 0619  NA 136 (A) 08/27/2020 0000   K 4.0 12/20/2024 0619   CL 110 12/20/2024 0619   CO2 22 12/20/2024 0619   BUN 27 (H) 12/20/2024 0619   BUN 21 08/27/2020 0000   CREATININE 1.05 (H) 12/20/2024 0619   GLUCOSE 74 12/20/2024 0619   CALCIUM  7.9 (L) 12/20/2024 0619   AST 27 12/19/2024 2222   ALT 18 12/19/2024 2222   ALKPHOS 93 12/19/2024 2222   BILITOT 0.3 12/19/2024 2222   PROT 7.7 12/19/2024 2222   ALBUMIN 3.8 12/19/2024 2222    Physical Exam: Vital Signs: BP (!) (P) 91/54 (BP Location: Right Arm)   Pulse (P) 82   Temp (P) 98 F (36.7 C) (Oral)   Resp (P) 16   LMP  (LMP Unknown)   SpO2 (P) 93%  SpO2: SpO2: (P) 93 % O2 Device: O2 Device: (P) Room Air O2 Flow Rate:   Intake/output summary:  Intake/Output Summary (Last 24 hours) at 12/21/2024 9195 Last data filed at 12/20/2024 1122 Gross per 24 hour  Intake 832.77 ml  Output --  Net 832.77 ml   LBM: Last BM Date :  (UTA) Baseline Weight:   Most recent weight:    General: Chronically ill-appearing, sleeping, cachectic,  frail Cardiovascular: RRR Respiratory: no increased work of breathing noted, not in respiratory distress Abdomen: not distended          Palliative Performance Scale: 10%              Additional Data Reviewed: Recent Labs    12/19/24 2222 12/20/24 0619  WBC 20.4* 16.9*  HGB 11.3* 8.7*  PLT 210 139*  NA 140 141  BUN 34* 27*  CREATININE 1.40* 1.05*    Imaging: DG Chest Port 1 View EXAM: 1 VIEW(S) XRAY OF THE CHEST 12/19/2024 10:38:00 PM  COMPARISON: Portable chest 10/13/2019.  CLINICAL HISTORY: fever, AMS Fever; altered mental status.  FINDINGS:  LINES, TUBES AND DEVICES: Multiple overlying telemetry leads.  LUNGS AND PLEURA: There is a small left pleural effusion with overlying consolidation or atelectasis in the left lower lung field most likely in the lower lobe.  The remaining lungs are clear. No pneumothorax.  HEART AND MEDIASTINUM: Stable mediastinum with a calcified and slightly tortuous aorta. The cardiac size is normal.  BONES AND SOFT TISSUES: Osteopenia. Degenerative change thoracic spine.  IMPRESSION: 1. Small left pleural effusion with overlying consolidation or atelectasis in the left lower lobe.  Electronically signed by: Francis Quam MD 12/20/2024 12:01 AM EST RP Workstation: [id number]    I personally reviewed recent imaging.   Palliative Care Assessment and Plan Summary of Established Goals of Care and Medical Treatment Preferences   Patient is an 84 year old female with a past medical history of vascular dementia, CAD status post PCI, insulin -dependent diabetes mellitus type 2, hypertension, and hyperlipidemia who was admitted on 12/20/2023 from long-term care facility for evaluation of fever and change in mentation.  Upon admission, patient determined to have severe sepsis secondary to aspiration pneumonia and AKI.  Hospitalist discussed care with patient's daughters on 12/20/2024 and after those discussions patient was transition to  comfort focused care.  Palliative medicine team consulted to assist with end-of-life care.  # Complex medical decision making/goals of care  - Patient unable to participate in complex medical decision making due to underlying medical status.  - Patient was transition to comfort focused care on 12/20/2024 after hospitalist spoke with patient's 2 daughters.  Continuing comfort focused care here in the hospital.  [facility] consulted to  assist with hospice coordination since patient is comfort care.  Either return to long-term care facility with hospice versus inpatient hospice referral.  Palliative medicine team continuing to follow along with patient's medical journey.  -  Code Status: Do not attempt resuscitation (DNR) - Comfort care   # Symptom management Patient is receiving these palliative interventions for symptom management with an intent to improve quality of life.  Patient already receiving multiple medications for symptom management at end-of-life.  - Pain/dyspnea, in setting of aspiration pneumonia and comfort focused care at end-of-life   - Change IV morphine  to 2-4 mg every 2 hours as needed for severe pain or dyspnea   - Start oral morphine  concentrate 5 mg every 3 hours as needed for moderate pain or dyspnea  # Psycho-social/Spiritual Support:  - Support System: 2 daughters  # Discharge Planning:  To Be Determined - Have consulted TOC to assist with coordination of discharge with hospice support whether that be return to facility with hospice versus inpatient hospice referral and evaluation.  Thank you for allowing the palliative care team to participate in the care Libi D Canipe.  Tinnie Radar, DO Palliative Care Provider PMT # [phone]  If patient remains symptomatic despite maximum doses, please call PMT at [phone] between 0700 and 1900. Outside of these hours, please call attending, as PMT does not have night coverage.  Billing based on MDM: High  Problems Addressed:  One acute or chronic illness or injury that poses a threat to life or bodily function  Risks: Parenteral controlled substances      [1]  Allergies Allergen Reactions   Lisinopril Other (See Comments)    Reaction:  Respiratory arrest    Spironolactone  Anaphylaxis   Vancomycin  Anaphylaxis and Rash   Donepezil  Diarrhea   Egg Protein-Containing Drug Products Diarrhea   Erythromycin Other (See Comments)    Reaction:  Makes pt feel weird    Macrolides And Ketolides Other (See Comments)    Reaction:  Unknown    Penicillins Itching and Other (See Comments)    Has patient had a PCN reaction causing immediate rash, facial/tongue/throat swelling, SOB or lightheadedness with hypotension: No Has patient had a PCN reaction causing severe rash involving mucus membranes or skin necrosis: No Has patient had a PCN reaction that required hospitalization No Has patient had a PCN reaction occurring within the last 10 years: No If all of the above answers are NO, then may proceed with Cephalosporin use.   Quinapril Hcl Itching   Angiotensin Receptor Blockers Other (See Comments)    Reaction:  Unknown    Lipitor [Atorvastatin ] Palpitations   Pentazocine Lactate Other (See Comments)    Reaction:  Unknown    Propoxyphene Hcl Other (See Comments)    Reaction unknown   Sulfonamide Derivatives Other (See Comments)    Reaction:  Unknown    "

## 2024-12-21 NOTE — Assessment & Plan Note (Addendum)
 Patient presenting with fever, tachycardia, AKI, lactic acidosis CXR shows small left pleural effusion with overlying consolidation Suspect aspiration given recent episodes of emesis UA is also abnormal COVID, influenza, RSV negative Treated with cefepime  and linezolid  -> ceftriaxone  and azithromycin  Blood cultures NTD Urine culture psitive for E faecalis Based on GOC conversations with each of her daughters individually, we are all in agreement with transitioning her to comfort measures only Antibiotics stopped She appears to be more stable today but is not eating/drinking significantly If she continues to be stable tomorrow, will again discuss with family about LTC with hospice vs. Residential hospice

## 2024-12-21 NOTE — Assessment & Plan Note (Addendum)
 Metoprolol , Plavix , atorvastatin , Zetia , Imdur  on hold

## 2024-12-21 NOTE — Assessment & Plan Note (Addendum)
 Per report, patient presenting with decreased level of interaction compared to baseline dementia Fall and delirium precautions Memantine  is on hold

## 2024-12-21 NOTE — Assessment & Plan Note (Addendum)
 Holding metformin  SSI stopped with transition to comfort care

## 2024-12-21 NOTE — Assessment & Plan Note (Addendum)
 Mild in setting of AKI Resolved with hydration

## 2024-12-21 NOTE — Assessment & Plan Note (Addendum)
 In the setting of sepsis Creatinine 1.40 on admission compared to 1.05 on 12/17/2024 Hydrated and has returned to baseline No longer checking labs in the setting of comfort care

## 2024-12-21 NOTE — Assessment & Plan Note (Addendum)
 Antihypertensives on hold in setting of sepsis and risk for hypotension Marginal BPs in the ER, improving with IVF (and cuff manipulation) Current BP is 115/49

## 2024-12-21 NOTE — Plan of Care (Signed)
  Problem: Pain Managment: Goal: General experience of comfort will improve and/or be controlled Outcome: Progressing

## 2024-12-22 DIAGNOSIS — A419 Sepsis, unspecified organism: Secondary | ICD-10-CM | POA: Diagnosis not present

## 2024-12-22 DIAGNOSIS — R6520 Severe sepsis without septic shock: Secondary | ICD-10-CM | POA: Diagnosis not present

## 2024-12-22 DIAGNOSIS — Z7189 Other specified counseling: Secondary | ICD-10-CM

## 2024-12-22 LAB — URINE CULTURE: Culture: >=100000 — AB

## 2024-12-22 MED ORDER — GLYCOPYRROLATE 1 MG PO TABS: 1.0000 mg | ORAL_TABLET | ORAL | Status: AC | PRN

## 2024-12-22 MED ORDER — MORPHINE SULFATE (CONCENTRATE) 10 MG /0.5 ML PO SOLN: 5.0000 mg | ORAL | 0 refills | Status: AC | PRN

## 2024-12-22 MED ORDER — LORAZEPAM 1 MG PO TABS: 1.0000 mg | ORAL_TABLET | ORAL | 0 refills | Status: AC | PRN

## 2024-12-22 MED ORDER — HALOPERIDOL LACTATE 2 MG/ML PO CONC: 0.5000 mg | ORAL | Status: AC | PRN

## 2024-12-22 MED ORDER — HALOPERIDOL 0.5 MG PO TABS: 0.5000 mg | ORAL_TABLET | ORAL | Status: AC | PRN

## 2024-12-22 MED ORDER — ACETAMINOPHEN 325 MG PO TABS: 650.0000 mg | ORAL_TABLET | Freq: Four times a day (QID) | ORAL | Status: AC | PRN

## 2024-12-22 NOTE — Plan of Care (Signed)
   Problem: Safety: Goal: Ability to remain free from injury will improve Outcome: Progressing

## 2024-12-22 NOTE — Assessment & Plan Note (Addendum)
 Mild in setting of AKI Resolved with hydration

## 2024-12-22 NOTE — Assessment & Plan Note (Addendum)
 Patient presenting with fever, tachycardia, AKI, lactic acidosis CXR shows small left pleural effusion with overlying consolidation Suspect aspiration given recent episodes of emesis UA is also abnormal COVID, influenza, RSV negative Treated with cefepime  and linezolid  -> ceftriaxone  and azithromycin  Blood cultures NTD Urine culture psitive for E faecalis Based on GOC conversations with each of her daughters individually, we are all in agreement with transitioning her to comfort measures only Antibiotics stopped She appears to be more stable today but is not eating/drinking significantly Hospice discussed with family about LTC with hospice vs. Residential hospice - family has opted for back to [facility] with hospice and they can accept her back today

## 2024-12-22 NOTE — Assessment & Plan Note (Addendum)
 In the setting of sepsis Creatinine 1.40 on admission compared to 1.05 on 12/17/2024 Hydrated and has returned to baseline No longer checking labs in the setting of comfort care

## 2024-12-22 NOTE — Assessment & Plan Note (Addendum)
 Per report, patient presenting with decreased level of interaction compared to baseline dementia Fall and delirium precautions Memantine  is on hold

## 2024-12-22 NOTE — Assessment & Plan Note (Addendum)
 After discussions with both of her daughters, will stop aggressive care and transition to comfort measures only Palliative care consulted Decision was made for return to LTC with hospice

## 2024-12-22 NOTE — Progress Notes (Signed)
 Report called to [facility] per this nurse regarding transport to facility d/t discharge.

## 2024-12-22 NOTE — Assessment & Plan Note (Addendum)
 Metoprolol , Plavix , atorvastatin , Zetia , Imdur  on hold

## 2024-12-22 NOTE — Assessment & Plan Note (Addendum)
 Holding metformin  SSI stopped with transition to comfort care

## 2024-12-22 NOTE — Assessment & Plan Note (Addendum)
 Atorvastatin  and Zetia  on hold

## 2024-12-23 LAB — LEGIONELLA PNEUMOPHILA SEROGP 1 UR AG: L. pneumophila Serogp 1 Ur Ag: NEGATIVE

## 2024-12-25 LAB — CULTURE, BLOOD (ROUTINE X 2)
Culture: NO GROWTH
Culture: NO GROWTH
Special Requests: ADEQUATE
